# Patient Record
Sex: Male | Born: 1940 | Race: White | Hispanic: No | Marital: Married | Smoking: Former smoker
Health system: Southern US, Community
[De-identification: ages and names within clinical notes are randomized; demographics above are authoritative.]

## PROBLEM LIST (undated history)

## (undated) DIAGNOSIS — K219 Gastro-esophageal reflux disease without esophagitis: Secondary | ICD-10-CM

## (undated) DIAGNOSIS — G4733 Obstructive sleep apnea (adult) (pediatric): Secondary | ICD-10-CM

## (undated) DIAGNOSIS — I714 Abdominal aortic aneurysm, without rupture, unspecified: Secondary | ICD-10-CM

## (undated) DIAGNOSIS — C4490 Unspecified malignant neoplasm of skin, unspecified: Secondary | ICD-10-CM

## (undated) DIAGNOSIS — I499 Cardiac arrhythmia, unspecified: Secondary | ICD-10-CM

## (undated) DIAGNOSIS — M1990 Unspecified osteoarthritis, unspecified site: Secondary | ICD-10-CM

## (undated) DIAGNOSIS — Z860100 Personal history of colon polyps, unspecified: Secondary | ICD-10-CM

## (undated) DIAGNOSIS — G4730 Sleep apnea, unspecified: Secondary | ICD-10-CM

## (undated) DIAGNOSIS — I739 Peripheral vascular disease, unspecified: Secondary | ICD-10-CM

## (undated) DIAGNOSIS — I219 Acute myocardial infarction, unspecified: Secondary | ICD-10-CM

## (undated) DIAGNOSIS — I10 Essential (primary) hypertension: Secondary | ICD-10-CM

## (undated) DIAGNOSIS — Z87442 Personal history of urinary calculi: Secondary | ICD-10-CM

## (undated) DIAGNOSIS — R202 Paresthesia of skin: Secondary | ICD-10-CM

## (undated) DIAGNOSIS — E785 Hyperlipidemia, unspecified: Secondary | ICD-10-CM

## (undated) DIAGNOSIS — M2540 Effusion, unspecified joint: Secondary | ICD-10-CM

## (undated) DIAGNOSIS — N486 Induration penis plastica: Secondary | ICD-10-CM

## (undated) DIAGNOSIS — Z8619 Personal history of other infectious and parasitic diseases: Secondary | ICD-10-CM

## (undated) DIAGNOSIS — M2550 Pain in unspecified joint: Secondary | ICD-10-CM

## (undated) DIAGNOSIS — H269 Unspecified cataract: Secondary | ICD-10-CM

## (undated) DIAGNOSIS — J189 Pneumonia, unspecified organism: Secondary | ICD-10-CM

## (undated) DIAGNOSIS — I4891 Unspecified atrial fibrillation: Secondary | ICD-10-CM

## (undated) DIAGNOSIS — I509 Heart failure, unspecified: Secondary | ICD-10-CM

## (undated) DIAGNOSIS — H9319 Tinnitus, unspecified ear: Secondary | ICD-10-CM

## (undated) DIAGNOSIS — I2510 Atherosclerotic heart disease of native coronary artery without angina pectoris: Secondary | ICD-10-CM

## (undated) HISTORY — DX: Atherosclerotic heart disease of native coronary artery without angina pectoris: I25.10

## (undated) HISTORY — DX: Hyperlipidemia, unspecified: E78.5

## (undated) HISTORY — DX: Abdominal aortic aneurysm, without rupture, unspecified: I71.40

## (undated) HISTORY — DX: Induration penis plastica: N48.6

## (undated) HISTORY — DX: Acute myocardial infarction, unspecified: I21.9

## (undated) HISTORY — DX: Peripheral vascular disease, unspecified: I73.9

## (undated) HISTORY — DX: Essential (primary) hypertension: I10

## (undated) HISTORY — DX: Obstructive sleep apnea (adult) (pediatric): G47.33

## (undated) HISTORY — PX: COLONOSCOPY: SHX174

## (undated) HISTORY — DX: Heart failure, unspecified: I50.9

## (undated) HISTORY — DX: Unspecified osteoarthritis, unspecified site: M19.90

## (undated) HISTORY — DX: Sleep apnea, unspecified: G47.30

## (undated) HISTORY — DX: Unspecified atrial fibrillation: I48.91

## (undated) HISTORY — DX: Unspecified malignant neoplasm of skin, unspecified: C44.90

## (undated) HISTORY — DX: Abdominal aortic aneurysm, without rupture: I71.4

---

## 1973-10-31 HISTORY — PX: RHINOPLASTY: SUR1284

## 1997-10-31 HISTORY — PX: CORONARY ARTERY BYPASS GRAFT: SHX141

## 2002-01-09 ENCOUNTER — Ambulatory Visit (HOSPITAL_COMMUNITY): Admission: RE | Admit: 2002-01-09 | Discharge: 2002-01-09 | Payer: Self-pay | Admitting: Family Medicine

## 2002-01-09 ENCOUNTER — Encounter (INDEPENDENT_AMBULATORY_CARE_PROVIDER_SITE_OTHER): Payer: Self-pay | Admitting: Specialist

## 2004-03-01 ENCOUNTER — Encounter (INDEPENDENT_AMBULATORY_CARE_PROVIDER_SITE_OTHER): Payer: Self-pay | Admitting: *Deleted

## 2004-03-01 ENCOUNTER — Ambulatory Visit (HOSPITAL_COMMUNITY): Admission: RE | Admit: 2004-03-01 | Discharge: 2004-03-01 | Payer: Self-pay | Admitting: *Deleted

## 2005-08-31 HISTORY — PX: PENILE PROSTHESIS IMPLANT: SHX240

## 2005-09-05 ENCOUNTER — Ambulatory Visit (HOSPITAL_COMMUNITY): Admission: RE | Admit: 2005-09-05 | Discharge: 2005-09-07 | Payer: Self-pay | Admitting: Urology

## 2005-10-10 ENCOUNTER — Ambulatory Visit (HOSPITAL_BASED_OUTPATIENT_CLINIC_OR_DEPARTMENT_OTHER): Admission: RE | Admit: 2005-10-10 | Discharge: 2005-10-10 | Payer: Self-pay | Admitting: Urology

## 2005-10-10 ENCOUNTER — Ambulatory Visit (HOSPITAL_COMMUNITY): Admission: RE | Admit: 2005-10-10 | Discharge: 2005-10-10 | Payer: Self-pay | Admitting: Urology

## 2006-01-20 ENCOUNTER — Ambulatory Visit: Payer: Self-pay | Admitting: Internal Medicine

## 2006-01-27 ENCOUNTER — Ambulatory Visit: Payer: Self-pay | Admitting: Internal Medicine

## 2006-03-13 ENCOUNTER — Ambulatory Visit: Payer: Self-pay | Admitting: Internal Medicine

## 2007-01-31 ENCOUNTER — Ambulatory Visit: Payer: Self-pay | Admitting: Internal Medicine

## 2007-02-08 DIAGNOSIS — E782 Mixed hyperlipidemia: Secondary | ICD-10-CM | POA: Insufficient documentation

## 2007-02-08 DIAGNOSIS — I10 Essential (primary) hypertension: Secondary | ICD-10-CM | POA: Insufficient documentation

## 2007-02-19 ENCOUNTER — Ambulatory Visit: Payer: Self-pay | Admitting: Internal Medicine

## 2007-02-19 LAB — CONVERTED CEMR LAB
BUN: 12 mg/dL (ref 6–23)
Basophils Absolute: 0 10*3/uL (ref 0.0–0.1)
Basophils Relative: 1 % (ref 0.0–1.0)
CO2: 32 meq/L (ref 19–32)
Calcium: 9.8 mg/dL (ref 8.4–10.5)
Chloride: 108 meq/L (ref 96–112)
Creatinine, Ser: 1.1 mg/dL (ref 0.4–1.5)
Eosinophils Absolute: 0.2 10*3/uL (ref 0.0–0.6)
Eosinophils Relative: 3.3 % (ref 0.0–5.0)
GFR calc Af Amer: 86 mL/min
GFR calc non Af Amer: 71 mL/min
Glucose, Bld: 93 mg/dL (ref 70–99)
HCT: 41.5 % (ref 39.0–52.0)
Hemoglobin: 14.6 g/dL (ref 13.0–17.0)
Lymphocytes Relative: 38.6 % (ref 12.0–46.0)
MCHC: 35.2 g/dL (ref 30.0–36.0)
MCV: 91.6 fL (ref 78.0–100.0)
Monocytes Absolute: 0.5 10*3/uL (ref 0.2–0.7)
Monocytes Relative: 9.8 % (ref 3.0–11.0)
Neutro Abs: 2.3 10*3/uL (ref 1.4–7.7)
Neutrophils Relative %: 47.3 % (ref 43.0–77.0)
PSA: 1.83 ng/mL (ref 0.10–4.00)
Platelets: 199 10*3/uL (ref 150–400)
Potassium: 4.4 meq/L (ref 3.5–5.1)
RBC: 4.53 M/uL (ref 4.22–5.81)
RDW: 11.8 % (ref 11.5–14.6)
Sodium: 145 meq/L (ref 135–145)
WBC: 4.9 10*3/uL (ref 4.5–10.5)

## 2007-05-01 ENCOUNTER — Encounter: Payer: Self-pay | Admitting: Internal Medicine

## 2007-05-23 ENCOUNTER — Encounter: Payer: Self-pay | Admitting: Internal Medicine

## 2007-06-22 ENCOUNTER — Ambulatory Visit: Payer: Self-pay | Admitting: Family Medicine

## 2007-06-22 DIAGNOSIS — M79609 Pain in unspecified limb: Secondary | ICD-10-CM

## 2007-06-25 ENCOUNTER — Telehealth (INDEPENDENT_AMBULATORY_CARE_PROVIDER_SITE_OTHER): Payer: Self-pay | Admitting: *Deleted

## 2007-06-27 LAB — CONVERTED CEMR LAB
Basophils Absolute: 0 10*3/uL (ref 0.0–0.1)
Basophils Relative: 0.8 % (ref 0.0–1.0)
Eosinophils Absolute: 0.2 10*3/uL (ref 0.0–0.6)
Eosinophils Relative: 3.7 % (ref 0.0–5.0)
HCT: 40 % (ref 39.0–52.0)
Hemoglobin: 14 g/dL (ref 13.0–17.0)
Lymphocytes Relative: 38.9 % (ref 12.0–46.0)
MCHC: 34.9 g/dL (ref 30.0–36.0)
MCV: 91.4 fL (ref 78.0–100.0)
Monocytes Absolute: 0.6 10*3/uL (ref 0.2–0.7)
Monocytes Relative: 10.7 % (ref 3.0–11.0)
Neutro Abs: 2.6 10*3/uL (ref 1.4–7.7)
Neutrophils Relative %: 45.9 % (ref 43.0–77.0)
Platelets: 228 10*3/uL (ref 150–400)
RBC: 4.38 M/uL (ref 4.22–5.81)
RDW: 11.9 % (ref 11.5–14.6)
Uric Acid, Serum: 5.4 mg/dL (ref 2.4–7.0)
WBC: 5.5 10*3/uL (ref 4.5–10.5)

## 2007-12-14 ENCOUNTER — Encounter: Payer: Self-pay | Admitting: Internal Medicine

## 2008-07-04 ENCOUNTER — Ambulatory Visit: Payer: Self-pay | Admitting: Internal Medicine

## 2008-07-04 DIAGNOSIS — I2510 Atherosclerotic heart disease of native coronary artery without angina pectoris: Secondary | ICD-10-CM | POA: Insufficient documentation

## 2008-07-08 ENCOUNTER — Encounter (INDEPENDENT_AMBULATORY_CARE_PROVIDER_SITE_OTHER): Payer: Self-pay | Admitting: *Deleted

## 2008-07-11 ENCOUNTER — Encounter: Payer: Self-pay | Admitting: Internal Medicine

## 2008-07-24 ENCOUNTER — Ambulatory Visit: Payer: Self-pay | Admitting: Pulmonary Disease

## 2008-07-24 ENCOUNTER — Telehealth: Payer: Self-pay | Admitting: Internal Medicine

## 2008-07-24 DIAGNOSIS — G4710 Hypersomnia, unspecified: Secondary | ICD-10-CM | POA: Insufficient documentation

## 2008-07-24 DIAGNOSIS — I219 Acute myocardial infarction, unspecified: Secondary | ICD-10-CM | POA: Insufficient documentation

## 2008-07-24 DIAGNOSIS — Z85828 Personal history of other malignant neoplasm of skin: Secondary | ICD-10-CM | POA: Insufficient documentation

## 2008-07-30 ENCOUNTER — Encounter: Payer: Self-pay | Admitting: Pulmonary Disease

## 2008-08-26 ENCOUNTER — Ambulatory Visit (HOSPITAL_BASED_OUTPATIENT_CLINIC_OR_DEPARTMENT_OTHER): Admission: RE | Admit: 2008-08-26 | Discharge: 2008-08-26 | Payer: Self-pay | Admitting: Pulmonary Disease

## 2008-08-26 ENCOUNTER — Encounter: Payer: Self-pay | Admitting: Pulmonary Disease

## 2008-09-09 ENCOUNTER — Ambulatory Visit: Payer: Self-pay | Admitting: Pulmonary Disease

## 2008-09-11 ENCOUNTER — Telehealth (INDEPENDENT_AMBULATORY_CARE_PROVIDER_SITE_OTHER): Payer: Self-pay | Admitting: *Deleted

## 2008-09-12 ENCOUNTER — Ambulatory Visit: Payer: Self-pay | Admitting: Pulmonary Disease

## 2008-09-12 DIAGNOSIS — G4733 Obstructive sleep apnea (adult) (pediatric): Secondary | ICD-10-CM

## 2008-09-17 ENCOUNTER — Telehealth: Payer: Self-pay | Admitting: Pulmonary Disease

## 2008-09-22 ENCOUNTER — Telehealth (INDEPENDENT_AMBULATORY_CARE_PROVIDER_SITE_OTHER): Payer: Self-pay | Admitting: *Deleted

## 2008-10-13 ENCOUNTER — Telehealth (INDEPENDENT_AMBULATORY_CARE_PROVIDER_SITE_OTHER): Payer: Self-pay | Admitting: *Deleted

## 2008-11-10 ENCOUNTER — Ambulatory Visit: Payer: Self-pay | Admitting: Internal Medicine

## 2008-11-10 LAB — CONVERTED CEMR LAB
Bilirubin Urine: NEGATIVE
Blood in Urine, dipstick: NEGATIVE
Glucose, Urine, Semiquant: NEGATIVE
Ketones, urine, test strip: NEGATIVE
Nitrite: NEGATIVE
Protein, U semiquant: NEGATIVE
Specific Gravity, Urine: <1.005
Urobilinogen, UA: NEGATIVE
WBC Urine, dipstick: NEGATIVE
pH: 5

## 2008-11-14 LAB — CONVERTED CEMR LAB
ALT: 24 units/L (ref 0–53)
AST: 21 units/L (ref 0–37)
BUN: 15 mg/dL (ref 6–23)
Basophils Absolute: 0 10*3/uL (ref 0.0–0.1)
Basophils Relative: 0.6 % (ref 0.0–3.0)
CO2: 34 meq/L — ABNORMAL HIGH (ref 19–32)
Calcium: 9.3 mg/dL (ref 8.4–10.5)
Chloride: 104 meq/L (ref 96–112)
Creatinine, Ser: 1 mg/dL (ref 0.4–1.5)
Eosinophils Absolute: 0.2 10*3/uL (ref 0.0–0.7)
Eosinophils Relative: 5.4 % — ABNORMAL HIGH (ref 0.0–5.0)
GFR calc Af Amer: 96 mL/min
GFR calc non Af Amer: 79 mL/min
Glucose, Bld: 87 mg/dL (ref 70–99)
HCT: 42.5 % (ref 39.0–52.0)
Hemoglobin: 14.4 g/dL (ref 13.0–17.0)
Lymphocytes Relative: 43.3 % (ref 12.0–46.0)
MCHC: 33.9 g/dL (ref 30.0–36.0)
MCV: 94.8 fL (ref 78.0–100.0)
Monocytes Absolute: 0.5 10*3/uL (ref 0.1–1.0)
Monocytes Relative: 11.2 % (ref 3.0–12.0)
Neutro Abs: 1.6 10*3/uL (ref 1.4–7.7)
Neutrophils Relative %: 39.5 % — ABNORMAL LOW (ref 43.0–77.0)
PSA: 1.57 ng/mL (ref 0.10–4.00)
Platelets: 150 10*3/uL (ref 150–400)
Potassium: 4.6 meq/L (ref 3.5–5.1)
RBC: 4.48 M/uL (ref 4.22–5.81)
RDW: 12.3 % (ref 11.5–14.6)
Sodium: 141 meq/L (ref 135–145)
TSH: 3.08 microintl units/mL (ref 0.35–5.50)
WBC: 4.1 10*3/uL — ABNORMAL LOW (ref 4.5–10.5)

## 2008-12-17 ENCOUNTER — Encounter: Payer: Self-pay | Admitting: Pulmonary Disease

## 2009-02-09 ENCOUNTER — Ambulatory Visit: Payer: Self-pay | Admitting: Pulmonary Disease

## 2009-04-20 ENCOUNTER — Encounter: Payer: Self-pay | Admitting: Internal Medicine

## 2009-04-20 LAB — HM COLONOSCOPY

## 2009-04-23 ENCOUNTER — Encounter: Payer: Self-pay | Admitting: Pulmonary Disease

## 2009-05-01 ENCOUNTER — Encounter: Payer: Self-pay | Admitting: Internal Medicine

## 2009-09-14 ENCOUNTER — Encounter: Payer: Self-pay | Admitting: Internal Medicine

## 2010-01-01 ENCOUNTER — Ambulatory Visit: Payer: Self-pay | Admitting: Internal Medicine

## 2010-01-06 LAB — CONVERTED CEMR LAB
Basophils Absolute: 0 10*3/uL (ref 0.0–0.1)
Basophils Relative: 1 % (ref 0–1)
Eosinophils Absolute: 0.1 10*3/uL (ref 0.0–0.7)
Eosinophils Relative: 2 % (ref 0–5)
HCT: 42.7 % (ref 39.0–52.0)
Lymphs Abs: 2.5 10*3/uL (ref 0.7–4.0)
MCHC: 33 g/dL (ref 30.0–36.0)
MCV: 93.8 fL (ref 78.0–100.0)
Monocytes Absolute: 0.6 10*3/uL (ref 0.1–1.0)
Monocytes Relative: 10 % (ref 3–12)
PSA: 1.37 ng/mL (ref 0.10–4.00)
Platelets: 176 10*3/uL (ref 150–400)
RBC: 4.55 M/uL (ref 4.22–5.81)
RDW: 13.5 % (ref 11.5–15.5)
WBC: 5.5 10*3/uL (ref 4.0–10.5)

## 2010-01-11 ENCOUNTER — Ambulatory Visit: Payer: Self-pay | Admitting: Internal Medicine

## 2010-01-18 LAB — CONVERTED CEMR LAB
BUN: 17 mg/dL (ref 6–23)
Calcium: 9.3 mg/dL (ref 8.4–10.5)
Chloride: 107 meq/L (ref 96–112)
GFR calc non Af Amer: 70.41 mL/min (ref >60)
Glucose, Bld: 77 mg/dL (ref 70–99)
Potassium: 4.5 meq/L (ref 3.5–5.1)
Sodium: 142 meq/L (ref 135–145)

## 2010-04-07 ENCOUNTER — Encounter (INDEPENDENT_AMBULATORY_CARE_PROVIDER_SITE_OTHER): Payer: Self-pay | Admitting: *Deleted

## 2010-04-07 ENCOUNTER — Encounter: Payer: Self-pay | Admitting: Internal Medicine

## 2010-04-07 LAB — CONVERTED CEMR LAB
ALT: 31 units/L
Cholesterol: 150 mg/dL
HDL: 43 mg/dL
LDL Cholesterol: 72 mg/dL
Platelets: 156 10*3/uL
Triglycerides: 175 mg/dL
WBC, blood: 5.2 10*3/uL

## 2010-04-22 ENCOUNTER — Encounter (INDEPENDENT_AMBULATORY_CARE_PROVIDER_SITE_OTHER): Payer: Self-pay | Admitting: *Deleted

## 2010-08-06 ENCOUNTER — Ambulatory Visit: Payer: Self-pay | Admitting: Internal Medicine

## 2010-11-28 LAB — CONVERTED CEMR LAB
ALT: 26 units/L
AST: 30 units/L
Cholesterol: 162 mg/dL
HDL: 35 mg/dL
LDL Cholesterol: 75 mg/dL
Triglyceride fasting, serum: 256 mg/dL

## 2010-11-30 NOTE — Miscellaneous (Signed)
  Clinical Lists Changes  Observations: Added new observation of CPK: 221 units/L (04/22/2010 16:39) Added new observation of TRIGLYC TOT: 175 mg/dL (16/08/9603 54:09) Added new observation of LDL: 72 mg/dL (81/19/1478 29:56) Added new observation of CHOLESTEROL: 150 mg/dL (21/30/8657 84:69) Added new observation of SGPT (ALT): 31 units/L (04/07/2010 16:39) Added new observation of SGOT (AST): 23 units/L (04/07/2010 16:39) Added new observation of PLATELETS: 156 10*3/mm3 (04/07/2010 16:39) Added new observation of HGB: 15.4 g/dL (62/95/2841 32:44) Added new observation of WBC: 5.2 10*3/mm3 (04/07/2010 16:39) Added new observation of HDL: 43 mg/dL (11/02/7251 66:44)  Appended Document:

## 2010-11-30 NOTE — Miscellaneous (Signed)
Summary: labs from dr Harriette Bouillon (cardiology)  Clinical Lists Changes  Observations: Added new observation of SGPT (ALT): 26 units/L (09/14/2009 8:50) Added new observation of SGOT (AST): 30 units/L (09/14/2009 8:50) Added new observation of TRIGLYCERIDE: 256 mg/dL (16/08/9603 5:40) Added new observation of HDL: 35 mg/dL (98/08/9146 8:29) Added new observation of LDL: 75 mg/dL (56/21/3086 5:78) Added new observation of CHOLESTEROL: 162 mg/dL (46/96/2952 8:41)      Lipid Panel Test Date: 09/14/2009                        Value        Units        H/L   Reference  Cholesterol:          162          mg/dL              (324-401) LDL Cholesterol:      75           mg/dL              (02-725) HDL Cholesterol:      35           mg/dL              (36-64) Triglyceride:         256          mg/dL              (40-347) SGOT (AST)            30                              SGPT (ALT)            26                               Test ordered by:      Dr. Harriette Bouillon (cardiology)

## 2010-11-30 NOTE — Assessment & Plan Note (Signed)
Summary: CPX/NS/KDC   Vital Signs:  Patient profile:   70 year old male Height:      70 inches Weight:      229.4 pounds BMI:     33.03 Pulse rate:   58 / minute BP sitting:   120 / 70  Vitals Entered By: Shary Decamp (January 01, 2010 2:36 PM) CC: cpx - not fasting, sees cardiology every 6 months, last Td<10 years    History of Present Illness: Hyperlipidemia-- good medication compliance , no unusual  myalgias   Hypertension-- good medication compliance , ambulatory BPs in the 120s/70-80  Coronary artery disease-- sees Dr Gomez Cleverly q 6 months   SKIN CANCER, HX OF still sees derm routinely, q 6 months    yearly exam, chart reviewed   Preventive Screening-Counseling & Management  Caffeine-Diet-Exercise     Caffeine use/day: 1     Does Patient Exercise: yes     Type of exercise: walk     Times/week: 5      Drug Use:  no.    Current Medications (verified): 1)  Lisinopril 20 Mg Tabs (Lisinopril) .Marland Kitchen.. 1 By Mouth Two Times A Day 2)  Lipitor 20 Mg Tabs (Atorvastatin Calcium) .Marland Kitchen.. 1 By Mouth Once Daily 3)  Toprol Xl 200 Mg Xr24h-Tab (Metoprolol Succinate) .Marland Kitchen.. 1 By Mouth Once Daily 4)  Aspirin Adult Low Strength 81 Mg Tbec (Aspirin) .Marland Kitchen.. 1 By Mouth Once Daily  Allergies (verified): No Known Drug Allergies  Past History:  Past Medical History: Reviewed history from 11/10/2008 and no changes required. Hyperlipidemia Hypertension Coronary artery disease, s/p CABG h/o Myocardial Infarction SKIN CANCER, HX OF--  several, one of them was melanoma (sees derm routinely) OSA, started CPAP 12-09  Past Surgical History: Reviewed history from 11/10/2008 and no changes required. CABG 1999 penile implant 08-2005  Family History: Reviewed history from 11/10/2008 and no changes required. OSA--brother Colon ca-- aunt  prostate ca-- F heart disease: mother, father cancer: mother, father, brother, sister (all had skin cancer)   Social History: Reviewed history from  07/24/2008 and no changes required. pt is married. children-- 1 son, 2 step children  Patient states former smoker ETOH-- socially  exercise -- more active since the weather improved diet-- started back a good diet recently Does Patient Exercise:  yes Drug Use:  no Caffeine use/day:  1  Review of Systems CV:  Denies chest pain or discomfort, palpitations, and swelling of feet. Resp:  Denies cough and shortness of breath. GI:  Denies bloody stools, diarrhea, nausea, and vomiting. GU:  Denies hematuria, urinary frequency, and urinary hesitancy.  Physical Exam  General:  alert, well-developed, and well-nourished.   Neck:  no thyromegaly and normal carotid upstroke.   Lungs:  normal respiratory effort, no intercostal retractions, no accessory muscle use, and normal breath sounds.   Heart:  normal rate, regular rhythm, no murmur, and no gallop.   Abdomen:  soft, non-tender, no distention, and no masses.   Rectal:  No external abnormalities noted. Normal sphincter tone. No rectal masses or tenderness. Prostate:  Prostate gland firm and smooth, no enlargement, nodularity, tenderness, mass, asymmetry or induration. Extremities:  no edema, calves  symmetric Psych:  Cognition and judgment appear intact. Alert and cooperative with normal attention span and concentration.  not anxious appearing and not depressed appearing.     Impression & Recommendations:  Problem # 1:  CORONARY ARTERY DISEASE (ICD-414.00) asymptomatic, states that she saw his cardiologist a few weeks ago, apparently a BMP, LFTs  and cholesterol panel were drawn His updated medication list for this problem includes:    Lisinopril 20 Mg Tabs (Lisinopril) .Marland Kitchen... 1 by mouth two times a day    Toprol Xl 200 Mg Xr24h-tab (Metoprolol succinate) .Marland Kitchen... 1 by mouth once daily    Aspirin Adult Low Strength 81 Mg Tbec (Aspirin) .Marland Kitchen... 1 by mouth once daily  Problem # 2:  HEALTH SCREENING (ICD-V70.0) Td  < 10 years pneumonia shot 2006  and today  pt reports first Cscope in 2007 aprox. polyps x 6  2nd Cscope in 2008 was 'ok' last colonoscopy 03/2009, next  in 5 years per GI  recommend to continue with a healthy lifestyle check a PSA    Problem # 3:  OBSTRUCTIVE SLEEP APNEA (ICD-327.23) last note from  pulmonary  reviewed   Problem # 4:  HYPERTENSION (ICD-401.9)  at goal  will get labs from cardiology. Check a CBC and a TSH His updated medication list for this problem includes:    Lisinopril 20 Mg Tabs (Lisinopril) .Marland Kitchen... 1 by mouth two times a day    Toprol Xl 200 Mg Xr24h-tab (Metoprolol succinate) .Marland Kitchen... 1 by mouth once daily  BP today: 120/70 Prior BP: 138/70 (02/09/2009)  Labs Reviewed: K+: 4.6 (11/10/2008) Creat: : 1.0 (11/10/2008)     Orders: Venipuncture (16109)  Problem # 5:  HYPERLIPIDEMIA (ICD-272.4) follow up with cardiology His updated medication list for this problem includes:    Lipitor 20 Mg Tabs (Atorvastatin calcium) .Marland Kitchen... 1 by mouth once daily  Labs Reviewed: SGOT: 21 (11/10/2008)   SGPT: 24 (11/10/2008)  Problem # 6:  he is follow up closely by cardiology next appointment: One year  Complete Medication List: 1)  Lisinopril 20 Mg Tabs (Lisinopril) .Marland Kitchen.. 1 by mouth two times a day 2)  Lipitor 20 Mg Tabs (Atorvastatin calcium) .Marland Kitchen.. 1 by mouth once daily 3)  Toprol Xl 200 Mg Xr24h-tab (Metoprolol succinate) .Marland Kitchen.. 1 by mouth once daily 4)  Aspirin Adult Low Strength 81 Mg Tbec (Aspirin) .Marland Kitchen.. 1 by mouth once daily  Other Orders: Pneumococcal Vaccine (60454) Admin 1st Vaccine (09811)  CHF Assessment/Plan:      The patient's current weight is 229.4 pounds.  His previous weight was 214.25 pounds.    Patient Instructions: 1)  Please schedule a follow-up appointment in 1 year.    Preventive Care Screening  Prior Values:    PSA:  1.57 (11/10/2008)    Colonoscopy:  Hyperplastic Polyp (04/20/2009)    Last Pneumovax:  per pt (10/31/2004)     Risk Factors:  Drug use:   no Caffeine use:  1 drinks per day Exercise:  yes    Times per week:  5    Type:  walk    Immunizations Administered:  Pneumonia Vaccine:    Vaccine Type: Pneumovax (Medicare)    Site: right deltoid    Mfr: Merck    Dose: 0.5 ml    Route: IM    Given by: Shary Decamp    Exp. Date: 02/18/2011    Lot #: [id number]

## 2010-11-30 NOTE — Assessment & Plan Note (Signed)
Summary: FLU SHOT//KN  Nurse Visit   Allergies: No Known Drug Allergies  Orders Added: 1)  Flu Vaccine 91yrs + MEDICARE PATIENTS [Q2039] 2)  Administration Flu vaccine - MCR [G0008] Flu Vaccine Consent Questions     Do you have a history of severe allergic reactions to this vaccine? no    Any prior history of allergic reactions to egg and/or gelatin? no    Do you have a sensitivity to the preservative Thimersol? no    Do you have a past history of Guillan-Barre Syndrome? no    Do you currently have an acute febrile illness? no    Have you ever had a severe reaction to latex? no    Vaccine information given and explained to patient? yes    Are you currently pregnant? no    Lot Number:[id number]   Exp Date:04/30/2011   Site Given  Right Deltoid IMistration Flu vaccine - MCR [G0008] .lbmedflu

## 2011-02-11 ENCOUNTER — Encounter: Payer: Self-pay | Admitting: Internal Medicine

## 2011-02-16 ENCOUNTER — Encounter: Payer: Self-pay | Admitting: Internal Medicine

## 2011-02-16 ENCOUNTER — Ambulatory Visit (INDEPENDENT_AMBULATORY_CARE_PROVIDER_SITE_OTHER): Payer: Medicare PPO | Admitting: Internal Medicine

## 2011-02-16 DIAGNOSIS — I2510 Atherosclerotic heart disease of native coronary artery without angina pectoris: Secondary | ICD-10-CM

## 2011-02-16 DIAGNOSIS — I10 Essential (primary) hypertension: Secondary | ICD-10-CM

## 2011-02-16 DIAGNOSIS — G4700 Insomnia, unspecified: Secondary | ICD-10-CM

## 2011-02-16 DIAGNOSIS — Z125 Encounter for screening for malignant neoplasm of prostate: Secondary | ICD-10-CM

## 2011-02-16 DIAGNOSIS — Z0000 Encounter for general adult medical examination without abnormal findings: Secondary | ICD-10-CM | POA: Insufficient documentation

## 2011-02-16 DIAGNOSIS — G4733 Obstructive sleep apnea (adult) (pediatric): Secondary | ICD-10-CM | POA: Insufficient documentation

## 2011-02-16 LAB — CBC WITH DIFFERENTIAL/PLATELET
Basophils Absolute: 0 10*3/uL (ref 0.0–0.1)
Eosinophils Absolute: 0.1 10*3/uL (ref 0.0–0.7)
Eosinophils Relative: 1.5 % (ref 0.0–5.0)
HCT: 44.1 % (ref 39.0–52.0)
Hemoglobin: 15.4 g/dL (ref 13.0–17.0)
Lymphocytes Relative: 34.2 % (ref 12.0–46.0)
Lymphs Abs: 2 10*3/uL (ref 0.7–4.0)
MCHC: 35 g/dL (ref 30.0–36.0)
MCV: 94.3 fl (ref 78.0–100.0)
Monocytes Absolute: 0.5 10*3/uL (ref 0.1–1.0)
Neutrophils Relative %: 55.7 % (ref 43.0–77.0)
Platelets: 165 10*3/uL (ref 150.0–400.0)
RBC: 4.68 Mil/uL (ref 4.22–5.81)
RDW: 13.2 % (ref 11.5–14.6)
WBC: 5.7 10*3/uL (ref 4.5–10.5)

## 2011-02-16 LAB — BASIC METABOLIC PANEL
CO2: 31 mEq/L (ref 19–32)
Calcium: 9.4 mg/dL (ref 8.4–10.5)
GFR: 60.56 mL/min (ref >60.00)
Glucose, Bld: 88 mg/dL (ref 70–99)
Potassium: 4.7 mEq/L (ref 3.5–5.1)
Sodium: 143 mEq/L (ref 135–145)

## 2011-02-16 LAB — ALT: ALT: 27 U/L (ref 0–53)

## 2011-02-16 LAB — TSH: TSH: 2.06 u[IU]/mL (ref 0.35–5.50)

## 2011-02-16 LAB — PSA: PSA: 1.94 ng/mL (ref 0.10–4.00)

## 2011-02-16 LAB — AST: AST: 31 U/L (ref 0–37)

## 2011-02-16 NOTE — Assessment & Plan Note (Addendum)
Td  < 10 years pneumonia shot 2006 and 2011  pt reports first Cscope in 2007 aprox. polyps x 6  2nd Cscope in 2008 was 'ok' last colonoscopy 03/2009, next  in 5 years per GI  recommend to continue with a healthy lifestyle We'll check all his labs including a PSA. He's not fasting, I will recommend him to check his cholesterol with cardiology, he sees them twice a year

## 2011-02-16 NOTE — Patient Instructions (Signed)
I recommend you to check your cholesterol with cardiology.

## 2011-02-16 NOTE — Assessment & Plan Note (Signed)
Sees cardiology, Dr. Harriette Bouillon in [facility] every 6 months. Apparently get his cholesterol checked there

## 2011-02-16 NOTE — Assessment & Plan Note (Signed)
Well-controlled, no change 

## 2011-02-16 NOTE — Assessment & Plan Note (Signed)
Counseled about good habits. Has used [facility] "PMs" meds . We discussed meds, will call if interested

## 2011-02-16 NOTE — Progress Notes (Signed)
  Subjective:    Patient ID: Philip [name], male    DOB: 1941-01-26, 70 y.o.   MRN: [id number]  HPI  CPX Doing well except for insomnia, frequently he wakes up at night and is unable to go back to sleep. Think is related to worries that he has. "Unable to shut down my brain"  Past Medical History  Diagnosis Date  . Hyperlipidemia   . Hypertension   . CAD (coronary artery disease)     s/p CABG  . Myocardial infarct   . Skin cancer     several , one of them was melanoma (sees derm routinely)  . OSA (obstructive sleep apnea)     started CPAP 12/09   Past Surgical History  Procedure Date  . Coronary artery bypass graft 1999  . Penile prosthesis implant 08/2005   Family History  Problem Relation Age of Onset  . Sleep apnea Brother   . Colon cancer      aunt  . Prostate cancer Father   . Heart disease Mother     M and F  . Skin cancer Mother     M, F, B, S    History   Social History  . Marital Status: Married    Spouse Name: N/A    Number of Children: 3  . Years of Education: N/A   Occupational History  . Not on file.   Social History Main Topics  . Smoking status: Former Games developer  . Smokeless tobacco: Not on file  . Alcohol Use: Yes     socially  . Drug Use: No  . Sexually Active: Not on file   Other Topics Concern  . Not on file   Social History Narrative   1 son, 2 step chaldern.Marland KitchenMarland KitchenMarland KitchenExercise- golf x2/w. Walks ....Marland KitchenMarland KitchenDiet-- does watch     Review of Systems  Constitutional:       Some allergies, on allegra, works well   Respiratory: Negative for cough and wheezing.   Cardiovascular: Negative for chest pain, palpitations and leg swelling.  Gastrointestinal: Negative for nausea, abdominal pain, diarrhea and blood in stool.  Genitourinary: Negative for dysuria and urgency.  Psychiatric/Behavioral:       Some anxiety       Objective:   Physical Exam  Constitutional: He is oriented to person, place, and time. He appears well-developed and  well-nourished.  Neck:       Normal carotid pulses, no thyromegaly  Cardiovascular: Normal rate, regular rhythm and normal heart sounds.   No murmur heard. Pulmonary/Chest: Effort normal and breath sounds normal. No respiratory distress. He has no wheezes. He has no rales.  Abdominal: Soft. Bowel sounds are normal. He exhibits no distension. There is no tenderness. There is no rebound and no guarding.  Genitourinary: Rectum normal and prostate normal.  Musculoskeletal: He exhibits no edema.  Neurological: He is alert and oriented to person, place, and time.  Psychiatric: He has a normal mood and affect. His behavior is normal. Judgment and thought content normal.          Assessment & Plan:  Today , I spent more than 25  min with the patient reviewing his chart and  discussing his chronic medical problems and a new problem (insomnia); see a/p

## 2011-02-18 ENCOUNTER — Telehealth: Payer: Self-pay | Admitting: *Deleted

## 2011-02-18 NOTE — Telephone Encounter (Signed)
Message copied by Army Fossa on Fri Feb 18, 2011  3:17 PM ------      Message from: Willow Ora      Created: Fri Feb 18, 2011 12:43 PM       Advise patient, all his results are normal. Send him and  Dr. Harriette Bouillon ( cardiology in [facility]) a copy of results

## 2011-02-18 NOTE — Telephone Encounter (Signed)
Unable to reach pt, will try later. Will labs to cardiologist.

## 2011-02-21 NOTE — Telephone Encounter (Signed)
I spoke w/ pt he is aware.  

## 2011-03-15 NOTE — Procedures (Signed)
Philip [name], Philip [name]                ACCOUNT NO.:  [id number]   MEDICAL RECORD NO.:  [id number]          PATIENT TYPE:  OUT   LOCATION:  SLEEP CENTER                 FACILITY:  [facility]   PHYSICIAN:  Barbaraann Share, MD,FCCPDATE OF BIRTH:  1941/04/06   DATE OF STUDY:  08/26/2008                            NOCTURNAL POLYSOMNOGRAM   REFERRING PHYSICIAN:   LOCATION:  Sleep Lab.   REFERRING PHYSICIAN:  Barbaraann Share, MD, [facility]   DATE OF STUDY:  August 26, 2008.   INDICATION FOR STUDY:  Hypersomnia with sleep apnea.   EPWORTH SLEEPINESS SCORE:  4.   MEDICATIONS:   SLEEP ARCHITECTURE:  The patient had a total sleep time of 313 minutes  with no slow-wave sleep and only 27 minutes of REM.  Sleep-onset latency  was normal and REM onset was mildly prolonged at a 133 minutes.  Sleep  efficiency was decreased at 79%.   RESPIRATORY DATA:  The patient was found to have 141 apneas and 29  hypopneas for an apnea-hypopnea index of 33 events per hour.  The events  were increased in the supine position, and there was loud snoring noted  throughout.   OXYGEN DATA:  There was O2 desaturation as low as 84% with the patient's  obstructive events.   CARDIAC DATA:  Frequent PACs and PVCs were noted throughout.   MOVEMENT/PARASOMNIA:  The patient was found to have 92 periodic leg  movements with no significant sleep disruption.   IMPRESSION/RECOMMENDATION:  1. Moderate obstructive sleep apnea/hypopnea syndrome with an apnea-      hypopnea index of 33 events per hour and oxygen desaturation as      well as 84%.  Treatment for this degree of sleep apnea can include      weight loss alone if applicable, upper airway surgery, oral      appliance, and also continuous positive airway pressure.      Clinical correlation is suggested.  2. Frequent premature atrial complexes and premature ventricular      complexes noted throughout.      Barbaraann Share, MD,FCCP  Diplomate, American Board of Sleep  Medicine  Electronically Signed     [facility]/MEDQ  D:  09/09/2008 15:16:00  T:  09/10/2008 02:13:06  Job:  [id number]

## 2011-03-18 NOTE — Op Note (Signed)
NAMECAMRYN, Philip [name]                ACCOUNT NO.:  [id number]   MEDICAL RECORD NO.:  [id number]          PATIENT TYPE:  OIB   LOCATION:  1418                         FACILITY:  [facility]   PHYSICIAN:  Sigmund I. Patsi Sears, M.D.DATE OF BIRTH:  1940-12-11   DATE OF PROCEDURE:  09/05/2005  DATE OF DISCHARGE:                                 OPERATIVE REPORT   PREOPERATIVE DIAGNOSES:  1.  Erectile dysfunction.  2.  Peyronie's disease.   POSTOPERATIVE DIAGNOSES:  1.  Erectile dysfunction.  2.  Peyronie's disease.   PROCEDURE:  1.  Placement of AMS inflatable penile prosthesis.  2.  Correction of Peyronie's curvature utilizing neurovascular bundle      mobilization and dorsal incision.   SURGEON:  Jethro Bolus, MD.   ASSISTANT:  Glade Nurse, MD.   ANESTHESIA:  General endotracheal.   IMPLANTATION.:  AMS 700 CX 17 cm with bilateral 1 cm rear-tip extenders with  60 mL reservoir.   PROCEDURE:  Using a Penrose drain as a tourniquet device, we obtained an  artificial erection using a butterfly needle approximately 30 mL of sterile  normal saline. This produced an erection with an approximately 70 degree  dorsal curvature.   The patient was identified by his wrist bracelet and brought to room 12  where he received preoperative antibiotics and was administered general  anesthesia. He was prepped and draped in the usual sterile fashion taking  care to minimize risks of peripheral neuropathy or compartment syndrome.  Next, we made a 4 cm infrapubic __________ incision and we dissected down to  the areolar tissue superior to the base of the patient's penis. We bluntly  dissected to identify the bilateral corporal cavernosa. We took care to  minimize dissection in the midline protecting the neurovascular bundle. We  placed an 18-French Foley catheter, obtained clear urine output and the  bladder was drained. Next, we placed stay sutures in the lateral aspect of  the corpora  cavernosum with 2-0 Vicryl's, two of these were placed on each  side. We next made our corporotomy on each side in between the  aforementioned stay sutures. We began our corporal dissection starting our  planes with Metzenbaum scissors. We then proceeded to dilate each corpora  both proximally and distally with Mentor dilators starting at 8-French and  sequentially dilating to 14-French. This was done without any difficulty  bilaterally. We next measured the length of each corpora by placing the  measuring device proximally, putting the phallus on a mild degree of  stretch, measuring to the mid glans level. This gave [place] a measurement of 18  cm bilaterally. Next, we turned our attention to the infrapubic incision. We  reset our retractors to expose suprapubically. We dissected down to the  anterior sheath which was opened with Bovie cautery. We found the midline in  between the bodies of the rectus abdominis and dissected a space in the  space of Retzius with the surgeon's first finger that would accommodate a  reservoir device. Next, the reservoir device was placed. We closed the  fascia with running 2-0 Vicryl being careful  not to involve the reservoir  device or tubing. Next we and inflated the reservoir with 60 mL of normal  saline. There was no back pressure noted. Next the reservoir tubing was  shot. Next using the furlough device, we placed our corporal cylinders  bilaterally and placed a 1 cm rear-tip extension on each proximal cylinder.  The cylinders sat nicely in the corpora without any buckling. Next we  inflated our cylinders, excellent fit was noted. However, the patient's  contour was still noted to have an approximately 70 degree dorsal Peyronie's  was noted preoperatively. At this point, the curvature was too much to be  corrected with modeling. We next retracted the patient's foreskin and made a  circumferential incision proximal to the corona. We degloved the penis   sharply. Next, with meticulous sharp dissection, we mobilized the  neurovascular bundle for approximately 2 cm both proximal and distal to the  Peyronie's plaque which was at the midline dorsum approximately mid shaft.  We were able to mobilize the neurovascular bundle without damage to its  blood supply or contents. After this had been done, we reinspected the penis  and it was found to have now only approximately 30 degrees of dorsal  curvature. At this point, it was felt that this was not enough curvature to  warrant graft placement. We next, after the bundle had been mobilized, made  3 transverse incisions over the plaque area. These extended laterally from  the midline to approximately 0.5 cm to each side laterally. We dissected  down using the cutting current on the Bovie cautery through the septum to  each bilateral cylinder. This was done again in 3 spots over the plaque.  Each incision was approximately 0.5 cm from the next incision. When this had  been done, the phallus was noted to be straight without a any evidence of  chordee. Next, we made sure we obtained hemostasis from the degloved penis  and the circumferential incision was reapproximated using interrupted 3-0  chromic sutures. Next, we turned our attention back to the inferior pubic  incision. Our corporotomies was were closed with running 2-0 Vicryl taking  great care to avoid damage to the cylinders or the tubing. Next using the  surgeon's first finger and blunt dissection, we created a subdartos pouch in  the patient's right hemiscrotum in the patient's most dependent portion of  the hemiscrotum. We then placed the pump in the correct orientation in the  right hemiscrotum. After we irrigated the tubing with sterile normal saline,  we used a connector kit to connect the reservoir to the pump tubing using a  straight connector. We then copiously irrigated the wound. Following this, the wound was closed in two layers  closing the Scarpa's with a running 3-0  Vicryl and closing the skin with a running 4-0 subcuticular Monocryl.  Dressings were applied. We then were inflated the IPP. The patient again was  noted to have excellent contour as described above. We then deflated the  device leaving a small amount of fluid in the cylinders to provide  hemostasis. Next, the dressings were applied. The Foley catheter was  connected to drainage bag. The patient was reversed from his anesthesia  which he tolerated without complication. Please note Dr. Jethro Bolus  was present and participated in all aspects of this case.     ______________________________  Glade Nurse, MD      Sigmund I. Patsi Sears, M.D.  Electronically Signed    MT/MEDQ  D:  09/05/2005  T:  09/06/2005  Job:  [id number]

## 2011-03-18 NOTE — Op Note (Signed)
NAME:  Philip [name], Philip [name]                ACCOUNT NO.:  [id number]   MEDICAL RECORD NO.:  [id number]          PATIENT TYPE:  AMB   LOCATION:  [facility]                         FACILITY:  [facility]   PHYSICIAN:  Sigmund I. Patsi Sears, M.D.DATE OF BIRTH:  01/19/1941   DATE OF PROCEDURE:  10/10/2005  DATE OF DISCHARGE:                                 OPERATIVE REPORT   PREOPERATIVE DIAGNOSIS:  Paraphimosis, status post inflatable penile  prosthesis, with neurovascular bundle dissection.   POSTOPERATIVE DIAGNOSIS:  Paraphimosis, status post inflatable penile  prosthesis, with neurovascular bundle dissection.   OPERATION:  Circumcision.   SURGEON:  Sigmund I. Patsi Sears, M.D.   ANESTHESIA:  General LMA.   PREPARATION:  After appropriate preanesthesia, the patient is brought to the  operating room, placed upon the operating table in the dorsal supine  position where general LMA anesthesia was introduced.   REVIEW OF HISTORY:  This 70 year old male was status post insertion of an  inflatable penile prosthesis, with neurovascular bundle dissection, on  November 2006. Postoperatively, the patient has done well, except for  paraphimosis. He has had good sensation in his penile glans, but he  continues to have penile edema, particularly in the distal foreskin.   Because the foreskin has not released, and my concern is that the patient  may develop decrease in blood supply to the glans, I think he needs to have  release of the binding tissue. He is now for release of paraphimotic scar  tissue.   DESCRIPTION OF PROCEDURE:  With the patient in the supine position, general  LMA anesthesia introduced. He remained in this position, where the pubis was  prepped with Betadine solution and draped in the usual fashion.   I reviewed this case with Dr. Vernie Ammons and Dr. Brunilda Payor. Examine under anesthesia  revealed that the patient has paraphimosis, and edema of the distal  foreskin. I have squeezed the distal  foreskin, and reduced the edema, but I  still am not able to bring the foreskin back over the glans. Therefore, two  incisions were made, circumferentially around the penis at the level of the  tightest paraphimotic area and this skin removed, subcutaneous tissue  dissected. Old hematomas was identified and this is released. The corpora  appears healthy, the skin edges appeared healthy. Additional distal foreskin  is then removed, and a formal circumcision approached, with four separate  quadrant sutures of 5-0 Monocryl suture. Each quadrant was then closed with  interrupted 5-0 Monocryl. The patient tolerated  the procedure well. A sterile dressing was applied. The case was performed  under IV antibiotics. PAS hose were used during the procedure. He tolerated  the procedure well, was awakened and taken to the recovery room in good  condition. Dr. Brunilda Payor provided intraoperative consultation.      Sigmund I. Patsi Sears, M.D.  Electronically Signed     SIT/MEDQ  D:  10/10/2005  T:  10/10/2005  Job:  [id number]

## 2011-06-29 ENCOUNTER — Ambulatory Visit (INDEPENDENT_AMBULATORY_CARE_PROVIDER_SITE_OTHER): Payer: Medicare PPO | Admitting: Internal Medicine

## 2011-06-29 ENCOUNTER — Encounter: Payer: Self-pay | Admitting: Internal Medicine

## 2011-06-29 DIAGNOSIS — M79603 Pain in arm, unspecified: Secondary | ICD-10-CM

## 2011-06-29 DIAGNOSIS — M79609 Pain in unspecified limb: Secondary | ICD-10-CM

## 2011-06-29 LAB — MAGNESIUM: Magnesium: 2.3 mg/dL (ref 1.5–2.5)

## 2011-06-29 NOTE — Assessment & Plan Note (Addendum)
Unclear etiology of episodes as described in the history of present illness, no classic symptoms of heart disease, radiculopathy et Karie Soda.  seizure disorder? We agreed to monitor the symptoms, if they get more frequent or intense, will do further investigation. Check a Mg level, see next

## 2011-06-29 NOTE — Patient Instructions (Signed)
Call if symptoms increase or no better in few weeks

## 2011-06-29 NOTE — Assessment & Plan Note (Signed)
History of hypomagnesemia, an over-the-counter supplements. Labs

## 2011-06-29 NOTE — Progress Notes (Signed)
  Subjective:    Patient ID: Philip [name], male    DOB: 06/28/1941, 70 y.o.   MRN: [id number]  HPI In the last month, he had three  1-minute episodes of bilateral shooting arm pain not associated with nausea, neck pain, confusion, tongue biting. 2 of the episodes were at rest, , one was when  he was trying to shampoo his hair. During this episode his arms were shaking.     Past Medical History  Diagnosis Date  . Hyperlipidemia   . Hypertension   . CAD (coronary artery disease)     had a MI , s/p CABG  . Skin cancer     several , one of them was melanoma (sees derm routinely)  . OSA (obstructive sleep apnea)     started CPAP 12/09   Past Surgical History  Procedure Date  . Coronary artery bypass graft 1999  . Penile prosthesis implant 08/2005     Review of Systems No blurred vision, diplopia or slurred speech. No otherwise  tingling or upper extremities paresthesias No chest pain or palpitations No bladder or bowel incontinence No major problems with neck pain the    Objective:   Physical Exam  Constitutional: He is oriented to person, place, and time. He appears well-developed and well-nourished.  HENT:  Head: Normocephalic and atraumatic.  Eyes: EOM are normal. Pupils are equal, round, and reactive to light.  Cardiovascular: Normal rate and regular rhythm.   No murmur heard. Pulmonary/Chest: Breath sounds normal. No respiratory distress. He has no wheezes. He has no rales.  Musculoskeletal: He exhibits no edema.  Neurological: He is alert and oriented to person, place, and time. Abnormal reflex: DTRs decreased throughout but symmetric. No cranial nerve deficit. He exhibits normal muscle tone. Coordination normal.  Psychiatric: He has a normal mood and affect. His behavior is normal. Judgment and thought content normal.          Assessment & Plan:

## 2011-09-15 ENCOUNTER — Ambulatory Visit (INDEPENDENT_AMBULATORY_CARE_PROVIDER_SITE_OTHER): Payer: Medicare PPO

## 2011-09-15 DIAGNOSIS — Z23 Encounter for immunization: Secondary | ICD-10-CM

## 2012-08-22 ENCOUNTER — Institutional Professional Consult (permissible substitution): Payer: Medicare PPO | Admitting: Pulmonary Disease

## 2012-09-14 ENCOUNTER — Ambulatory Visit (INDEPENDENT_AMBULATORY_CARE_PROVIDER_SITE_OTHER): Payer: Medicare PPO | Admitting: Internal Medicine

## 2012-09-14 ENCOUNTER — Encounter: Payer: Self-pay | Admitting: Internal Medicine

## 2012-09-14 VITALS — BP 146/88 | HR 50 | Temp 98.1°F | Ht 70.25 in | Wt 240.0 lb

## 2012-09-14 DIAGNOSIS — Z125 Encounter for screening for malignant neoplasm of prostate: Secondary | ICD-10-CM

## 2012-09-14 DIAGNOSIS — I2510 Atherosclerotic heart disease of native coronary artery without angina pectoris: Secondary | ICD-10-CM

## 2012-09-14 DIAGNOSIS — Z0000 Encounter for general adult medical examination without abnormal findings: Secondary | ICD-10-CM

## 2012-09-14 DIAGNOSIS — Z23 Encounter for immunization: Secondary | ICD-10-CM

## 2012-09-14 DIAGNOSIS — Z8042 Family history of malignant neoplasm of prostate: Secondary | ICD-10-CM

## 2012-09-14 DIAGNOSIS — I10 Essential (primary) hypertension: Secondary | ICD-10-CM

## 2012-09-14 DIAGNOSIS — G4733 Obstructive sleep apnea (adult) (pediatric): Secondary | ICD-10-CM

## 2012-09-14 DIAGNOSIS — E785 Hyperlipidemia, unspecified: Secondary | ICD-10-CM

## 2012-09-14 LAB — COMPREHENSIVE METABOLIC PANEL
ALT: 37 U/L (ref 0–53)
AST: 34 U/L (ref 0–37)
Albumin: 4.4 g/dL (ref 3.5–5.2)
Alkaline Phosphatase: 62 U/L (ref 39–117)
BUN: 15 mg/dL (ref 6–23)
CO2: 30 mEq/L (ref 19–32)
Calcium: 9.2 mg/dL (ref 8.4–10.5)
Chloride: 105 mEq/L (ref 96–112)
Creatinine, Ser: 1.1 mg/dL (ref 0.4–1.5)
GFR: 72.14 mL/min (ref >60.00)
Glucose, Bld: 109 mg/dL — ABNORMAL HIGH (ref 70–99)
Potassium: 4.5 mEq/L (ref 3.5–5.1)
Total Bilirubin: 0.9 mg/dL (ref 0.3–1.2)

## 2012-09-14 LAB — LIPID PANEL
Cholesterol: 149 mg/dL (ref 0–200)
HDL: 35.6 mg/dL — ABNORMAL LOW (ref >39.00)
Total CHOL/HDL Ratio: 4
Triglycerides: 197 mg/dL — ABNORMAL HIGH (ref 0.0–149.0)
VLDL: 39.4 mg/dL (ref 0.0–40.0)

## 2012-09-14 LAB — CBC WITH DIFFERENTIAL/PLATELET
Basophils Absolute: 0 10*3/uL (ref 0.0–0.1)
Basophils Relative: 0.7 % (ref 0.0–3.0)
Eosinophils Absolute: 0.1 10*3/uL (ref 0.0–0.7)
Eosinophils Relative: 2.2 % (ref 0.0–5.0)
Hemoglobin: 15.7 g/dL (ref 13.0–17.0)
Lymphocytes Relative: 34.3 % (ref 12.0–46.0)
MCHC: 33.6 g/dL (ref 30.0–36.0)
Monocytes Absolute: 0.4 10*3/uL (ref 0.1–1.0)
Monocytes Relative: 8.5 % (ref 3.0–12.0)
Neutro Abs: 2.5 10*3/uL (ref 1.4–7.7)
Neutrophils Relative %: 54.3 % (ref 43.0–77.0)
Platelets: 159 10*3/uL (ref 150.0–400.0)
RDW: 13 % (ref 11.5–14.6)
WBC: 4.6 10*3/uL (ref 4.5–10.5)

## 2012-09-14 LAB — PSA: PSA: 1.88 ng/mL (ref 0.10–4.00)

## 2012-09-14 MED ORDER — ZOSTER VACCINE LIVE 19400 UNT/0.65ML ~~LOC~~ SOLR: 0.6500 mL | Freq: Once | SUBCUTANEOUS | Status: DC

## 2012-09-14 MED ORDER — HYDROCHLOROTHIAZIDE 12.5 MG PO TABS: 12.5000 mg | ORAL_TABLET | Freq: Every day | ORAL | Status: DC

## 2012-09-14 NOTE — Assessment & Plan Note (Signed)
Asymptomatic, sees cardiology in [facility], Dr. Timoteo Gaul regularly

## 2012-09-14 NOTE — Assessment & Plan Note (Addendum)
Td  < 10 years Flu shot-- today pneumonia shot 2006 and 2011 zostavax-- benefits discussed Rx provided  Several Cscopes, had one in 2007 aprox. polyps x 6 , last 03-2009, hyperplastic polyps, next per [facility] (5 years) Diet and exercise discussed He has a family history of prostate cancer, DRE normal, check PSA.

## 2012-09-14 NOTE — Assessment & Plan Note (Signed)
Takes Mg supplements, we are starting a diuretic, will check a BMP and a magnesium level in 3 weeks

## 2012-09-14 NOTE — Assessment & Plan Note (Signed)
Ambulatory BPs around 146/88. Goal 120/80. Add HCTZ, see instructions

## 2012-09-14 NOTE — Patient Instructions (Signed)
Start hydrochlorothiazide 12.5 mg, take 2 tablets daily, in the morning. May decrease to one tablet daily if BP is too low. Schedule a lab appointment, 3 weeks from today: BMP, magnesium, dx hypertension. Check the  blood pressure 2 or 3 times a week, be sure it is between 110/60 and 140/80. If it is consistently higher or lower, let me know     Fall Prevention and Home Safety Falls cause injuries and can affect all age groups. It is possible to use preventive measures to significantly decrease the likelihood of falls. There are many simple measures which can make your home safer and prevent falls. OUTDOORS  Repair cracks and edges of walkways and driveways.  Remove high doorway thresholds.  Trim shrubbery on the main path into your home.  Have good outside lighting.  Clear walkways of tools, rocks, debris, and clutter.  Check that handrails are not broken and are securely fastened. Both sides of steps should have handrails.  Have leaves, snow, and ice cleared regularly.  Use sand or salt on walkways during winter months.  In the garage, clean up grease or oil spills. BATHROOM  Install night lights.  Install grab bars by the toilet and in the tub and shower.  Use non-skid mats or decals in the tub or shower.  Place a plastic non-slip stool in the shower to sit on, if needed.  Keep floors dry and clean up all water on the floor immediately.  Remove soap buildup in the tub or shower on a regular basis.  Secure bath mats with non-slip, double-sided rug tape.  Remove throw rugs and tripping hazards from the floors. BEDROOMS  Install night lights.  Make sure a bedside light is easy to reach.  Do not use oversized bedding.  Keep a telephone by your bedside.  Have a firm chair with side arms to use for getting dressed.  Remove throw rugs and tripping hazards from the floor. KITCHEN  Keep handles on pots and pans turned toward the center of the stove. Use back  burners when possible.  Clean up spills quickly and allow time for drying.  Avoid walking on wet floors.  Avoid hot utensils and knives.  Position shelves so they are not too high or low.  Place commonly used objects within easy reach.  If necessary, use a sturdy step stool with a grab bar when reaching.  Keep electrical cables out of the way.  Do not use floor polish or wax that makes floors slippery. If you must use wax, use non-skid floor wax.  Remove throw rugs and tripping hazards from the floor. STAIRWAYS  Never leave objects on stairs.  Place handrails on both sides of stairways and use them. Fix any loose handrails. Make sure handrails on both sides of the stairways are as long as the stairs.  Check carpeting to make sure it is firmly attached along stairs. Make repairs to worn or loose carpet promptly.  Avoid placing throw rugs at the top or bottom of stairways, or properly secure the rug with carpet tape to prevent slippage. Get rid of throw rugs, if possible.  Have an electrician put in a light switch at the top and bottom of the stairs. OTHER FALL PREVENTION TIPS  Wear low-heel or rubber-soled shoes that are supportive and fit well. Wear closed toe shoes.  When using a stepladder, make sure it is fully opened and both spreaders are firmly locked. Do not climb a closed stepladder.  Add color or contrast  paint or tape to grab bars and handrails in your home. Place contrasting color strips on first and last steps.  Learn and use mobility aids as needed. Install an electrical emergency response system.  Turn on lights to avoid dark areas. Replace light bulbs that burn out immediately. Get light switches that glow.  Arrange furniture to create clear pathways. Keep furniture in the same place.  Firmly attach carpet with non-skid or double-sided tape.  Eliminate uneven floor surfaces.  Select a carpet pattern that does not visually hide the edge of steps.  Be  aware of all pets. OTHER HOME SAFETY TIPS  Set the water temperature for 120 F (48.8 C).  Keep emergency numbers on or near the telephone.  Keep smoke detectors on every level of the home and near sleeping areas. Document Released: 10/07/2002 Document Revised: 04/17/2012 Document Reviewed: 01/06/2012 [facility] Patient Information 2013 [place], [place].

## 2012-09-14 NOTE — Assessment & Plan Note (Signed)
Good compliance with CPAP 

## 2012-09-14 NOTE — Assessment & Plan Note (Signed)
Due for labs

## 2012-09-14 NOTE — Progress Notes (Signed)
Subjective:    Patient ID: Philip [name], male    DOB: February 07, 1941, 71 y.o.   MRN: [id number]  HPI Here for Medicare AWV: 1. Risk factors based on Past M, S, F history: reviewed 2. Physical Activities: sedentary lately , wife sick  3. Depression/mood:  Screen neg  4. Hearing: slt decreased, does not affect ADL, + tinnitus x years, was eval before by a hearing specialist 5. ADL's: independent  6. Fall Risk: no recent problems , see instructions  7. home Safety: does feel safe at home  8. Height, weight, &visual acuity: see VS, recently seen by specialist, early cataracts 9. Counseling: provided 10. Labs ordered based on risk factors: if needed  11. Referral Coordination: if needed 12.  Care Plan, see assessment and plan  13.   Cognitive Assessment:normal cognition and motor skills   In addition, today we discussed the following: OSA, good compliance with CPAP History of skin cancer, sees dermatology every 6 months History of heart disease, good medication compliance, sees cardiology in [facility] regularly. Hypertension, good medication compliance, ambulatory BPs are around 145/88. he would like to see his BP better.  Past Medical History  Diagnosis Date  . Hyperlipidemia   . Hypertension   . CAD (coronary artery disease)     had a MI , s/p CABG  . Skin cancer     several , one of them was melanoma (sees derm routinely)  . OSA (obstructive sleep apnea)     started CPAP 12/09   Past Surgical History  Procedure Date  . Coronary artery bypass graft 1999  . Penile prosthesis implant 08/2005   History   Social History  . Marital Status: Married    Spouse Name: N/A    Number of Children: 3  . Years of Education: N/A   Occupational History  . retired, Research officer, political party    Social History Main Topics  . Smoking status: Former Smoker    Quit date: 09/14/1977  . Smokeless tobacco: Never Used  . Alcohol Use: Yes     Comment: socially  . Drug Use: No  . Sexually Active: Not  on file   Other Topics Concern  . Not on file   Social History Narrative   1 son, 2 step children---Exercise: little---Diet: room for improvment   Family History  Problem Relation Age of Onset  . Sleep apnea Brother   . Colon cancer      aunt  . Prostate cancer Father 42  . Heart disease Mother     M and F  . Skin cancer Mother     M, F, B, S  . Diabetes Neg Hx   . Stroke Neg Hx      Review of Systems No chest pain or shortness of breath No nausea, vomiting, diarrhea. From time to time he sees drops of blood in the stools after hard BM. No dysuria or difficulty urinating    Objective:   Physical Exam General -- alert, well-developed    Neck --no thyromegaly , normal carotid pulse Lungs -- normal respiratory effort, no intercostal retractions, no accessory muscle use, and normal breath sounds.   Heart-- normal rate, regular rhythm, no murmur, and no gallop.   Abdomen--soft, non-tender, no distention, no masses, no HSM, no guarding, and no rigidity. No bruit  Extremities-- no pretibial edema bilaterally Rectal-- No external abnormalities noted. Normal sphincter tone. No rectal masses or tenderness. Brown stool  Prostate:  Prostate gland firm and smooth, no enlargement,  nodularity, tenderness, mass, asymmetry or induration. Neurologic-- alert & oriented X3 and strength normal in all extremities. Psych-- Cognition and judgment appear intact. Alert and cooperative with normal attention span and concentration.  not anxious appearing and not depressed appearing.       Assessment & Plan:   Today , I spent more than 25 min with the patient, in addition to the CPX, managing his chronic medical problems.  Total time ~ 55 min

## 2012-09-17 ENCOUNTER — Encounter: Payer: Self-pay | Admitting: *Deleted

## 2013-05-29 ENCOUNTER — Ambulatory Visit (INDEPENDENT_AMBULATORY_CARE_PROVIDER_SITE_OTHER): Payer: Medicare PPO | Admitting: Cardiology

## 2013-05-29 ENCOUNTER — Encounter: Payer: Self-pay | Admitting: *Deleted

## 2013-05-29 VITALS — BP 152/90 | HR 57 | Ht 70.5 in | Wt 238.0 lb

## 2013-05-29 DIAGNOSIS — I2510 Atherosclerotic heart disease of native coronary artery without angina pectoris: Secondary | ICD-10-CM

## 2013-05-29 DIAGNOSIS — I10 Essential (primary) hypertension: Secondary | ICD-10-CM

## 2013-05-29 DIAGNOSIS — E785 Hyperlipidemia, unspecified: Secondary | ICD-10-CM

## 2013-05-29 MED ORDER — AMLODIPINE BESYLATE 2.5 MG PO TABS: 2.5000 mg | ORAL_TABLET | Freq: Every day | ORAL | Status: DC

## 2013-05-29 NOTE — Patient Instructions (Signed)
Start amlodipine 2.5mg  daily.   Your physician recommends that you return for a FASTING lipid profile /BMET.   Your physician has requested that you regularly monitor and record your blood pressure readings at home. Please use the same machine at the same time of day to check your readings and record them. I will call you in about 3 weeks to get the readings.   Your physician wants you to follow-up in: 6 months with Dr Shirlee Latch. (January 2015).  You will receive a reminder letter in the mail two months in advance. If you don't receive a letter, please call our office to schedule the follow-up appointment.

## 2013-05-30 NOTE — Progress Notes (Signed)
Patient ID: Philip [name], male   DOB: 1941/05/11, 72 y.o.   MRN: [id number] PCP: Dr. Drue Novel  72 yo with history of CAD s/p CABG in 1999 presents to establish outpatient cardiology followup.  He was followed by cardiology in [facility] in the past.  Last cath was in 2009, all grafts were patent. He has been doing well recently.  He walks for exercise and plays golf.  No exertional dyspnea or chest pain.  Some generalized fatigue but not limiting.  No syncope or tachypalpitations.  He has OSA and uses his CPAP. BP has been running high.  In 11/13 he was started on HCTZ but felt "bad" taking it and has stopped it. At home, SBP running 140s-150s.    ECG: NSR, IVCD with QRS 118 msec, nonspecific ST abnormalities.   Labs (11/13): K 4.5, creatinine 1.1, LDL 74, HDL 36  PMH: 1. Hyperlipidemia 2. HTN 3. CAD s/p CABG 1999 with LIMA-LAD, SVG-LCx, SVG-RCA.  LHC 2009 with 3/3 grafts patent.  4. H/o melanoma 5. OSA on CPAP  SH: Retired, married, 3 kids, quit smoking in 1978.  Lives in [place].   FH: Mother with CAD.   ROS: All systems reviewed and negative except as per HPI.   Current Outpatient Prescriptions  Medication Sig Dispense Refill  . atorvastatin (LIPITOR) 20 MG tablet Take 20 mg by mouth daily.        Marland Kitchen lisinopril (PRINIVIL,ZESTRIL) 20 MG tablet Take 20 mg by mouth 2 (two) times daily.       . metoprolol (TOPROL-XL) 200 MG 24 hr tablet Take 200 mg by mouth daily.        Marland Kitchen amLODipine (NORVASC) 2.5 MG tablet Take 1 tablet (2.5 mg total) by mouth daily.  90 tablet  3  . aspirin EC 81 MG tablet Take 1 tablet (81 mg total) by mouth daily.       No current facility-administered medications for this visit.    BP 152/90  Pulse 57  Ht 5' 10.5" (1.791 m)  Wt 107.956 kg (238 lb)  BMI 33.66 kg/m2 General: NAD Neck: No JVD, no thyromegaly or thyroid nodule.  Lungs: Clear to auscultation bilaterally with normal respiratory effort. CV: Nondisplaced PMI.  Heart regular S1/S2, no S3/S4, no  murmur.  Trace ankle edema.  No carotid bruit.  Normal pedal pulses.  Abdomen: Soft, nontender, no hepatosplenomegaly, no distention.  Skin: Intact without lesions or rashes.  Neurologic: Alert and oriented x 3.  Psych: Normal affect. Extremities: No clubbing or cyanosis.  HEENT: Normal.   Assessment/Plan 1. CAD: s/p CABG in 1999, last cath in 2009 with patent grafts.  No ischemic symptoms.  Continue current regimen of ASA 81, statin, ACEI, and Toprol XL.  2. HTN: BP is running high.  He did not tolerate HCTZ well.  Start amlodipine 2.5 mg daily.  He will check BP daily and record, and we will call in 2 wks for BP readings.  3. Hyperlipidemia: Check lipids.  Continue atorvastatin.    Followup in 6 months.  Marca Ancona 05/30/2013

## 2013-06-03 ENCOUNTER — Other Ambulatory Visit (INDEPENDENT_AMBULATORY_CARE_PROVIDER_SITE_OTHER): Payer: Medicare PPO

## 2013-06-03 ENCOUNTER — Other Ambulatory Visit: Payer: Self-pay | Admitting: *Deleted

## 2013-06-03 DIAGNOSIS — I2510 Atherosclerotic heart disease of native coronary artery without angina pectoris: Secondary | ICD-10-CM

## 2013-06-03 LAB — LIPID PANEL
LDL Cholesterol: 53 mg/dL (ref 0–99)
Total CHOL/HDL Ratio: 3

## 2013-06-03 LAB — BASIC METABOLIC PANEL
Chloride: 106 mEq/L (ref 96–112)
Creatinine, Ser: 1.2 mg/dL (ref 0.4–1.5)
Potassium: 4.4 mEq/L (ref 3.5–5.1)
Sodium: 140 mEq/L (ref 135–145)

## 2013-06-03 MED ORDER — LISINOPRIL 20 MG PO TABS: 20.0000 mg | ORAL_TABLET | Freq: Two times a day (BID) | ORAL | Status: DC

## 2013-06-19 ENCOUNTER — Telehealth: Payer: Self-pay | Admitting: *Deleted

## 2013-06-19 MED ORDER — AMLODIPINE BESYLATE 5 MG PO TABS: 5.0000 mg | ORAL_TABLET | Freq: Every day | ORAL | Status: DC

## 2013-06-19 NOTE — Telephone Encounter (Signed)
05/29/13 HTN: BP is running high. He did not tolerate HCTZ well. Start amlodipine 2.5 mg daily. He will check BP daily and record, and we will call in 2 wks for BP readings.   06/19/13--per wife BP readings: starting 06/05/13 -06/18/13 ---- 135/74 139/66 121/77 145/80 145/71 144/80 145/86 137/83 145/77 142/85 131/75 138/71 (most recent). I will forward to Dr Shirlee Latch for review.

## 2013-06-19 NOTE — Telephone Encounter (Signed)
Better BP still a little high.  Think he can tolerate going up to 5 mg daily amlodipine with call for BP check in 2 wks.

## 2013-06-19 NOTE — Telephone Encounter (Signed)
Pt's wife advised, verbalized understanding.

## 2013-07-03 ENCOUNTER — Telehealth: Payer: Self-pay | Admitting: *Deleted

## 2013-07-03 NOTE — Telephone Encounter (Signed)
Laurey Morale, MD at 06/19/2013 10:59 AM                Better BP still a little high. Think he can tolerate going up to 5 mg daily amlodipine with call for BP check in 2 wks.    07/03/13 pt states he increased amlodipine to 5 mg daily 06/22/13 BP readings starting 06/22/13 to 07/02/13 --149/78 06/23/13/ 150/71 06/24/13  140/85 140/78 120/66 142/77 130/61 125/66 110/51 116/65. Pt feels fine without complaints. I will forward to Dr Shirlee Latch for review.

## 2013-07-03 NOTE — Telephone Encounter (Signed)
Pt.notified

## 2013-07-03 NOTE — Telephone Encounter (Signed)
Looks ok, no changes 

## 2013-10-01 ENCOUNTER — Ambulatory Visit (INDEPENDENT_AMBULATORY_CARE_PROVIDER_SITE_OTHER): Payer: Medicare PPO

## 2013-10-01 DIAGNOSIS — Z23 Encounter for immunization: Secondary | ICD-10-CM

## 2013-10-31 HISTORY — PX: CARDIAC CATHETERIZATION: SHX172

## 2013-11-25 ENCOUNTER — Ambulatory Visit (INDEPENDENT_AMBULATORY_CARE_PROVIDER_SITE_OTHER): Payer: Medicare PPO | Admitting: Cardiology

## 2013-11-25 ENCOUNTER — Encounter: Payer: Self-pay | Admitting: Cardiology

## 2013-11-25 VITALS — BP 138/80 | HR 58 | Ht 70.5 in | Wt 227.0 lb

## 2013-11-25 DIAGNOSIS — R5381 Other malaise: Secondary | ICD-10-CM

## 2013-11-25 DIAGNOSIS — R5383 Other fatigue: Secondary | ICD-10-CM

## 2013-11-25 DIAGNOSIS — E785 Hyperlipidemia, unspecified: Secondary | ICD-10-CM

## 2013-11-25 DIAGNOSIS — I2510 Atherosclerotic heart disease of native coronary artery without angina pectoris: Secondary | ICD-10-CM

## 2013-11-25 DIAGNOSIS — I10 Essential (primary) hypertension: Secondary | ICD-10-CM

## 2013-11-25 LAB — CBC WITH DIFFERENTIAL/PLATELET
BASOS PCT: 0.7 % (ref 0.0–3.0)
Basophils Absolute: 0 10*3/uL (ref 0.0–0.1)
Eosinophils Absolute: 0.1 10*3/uL (ref 0.0–0.7)
Eosinophils Relative: 1.6 % (ref 0.0–5.0)
HCT: 40.7 % (ref 39.0–52.0)
HEMOGLOBIN: 13.4 g/dL (ref 13.0–17.0)
Lymphocytes Relative: 35.7 % (ref 12.0–46.0)
Lymphs Abs: 2.2 10*3/uL (ref 0.7–4.0)
MCHC: 33.1 g/dL (ref 30.0–36.0)
MCV: 91.4 fl (ref 78.0–100.0)
MONOS PCT: 9.2 % (ref 3.0–12.0)
Monocytes Absolute: 0.6 10*3/uL (ref 0.1–1.0)
NEUTROS ABS: 3.2 10*3/uL (ref 1.4–7.7)
Neutrophils Relative %: 52.8 % (ref 43.0–77.0)
Platelets: 164 10*3/uL (ref 150.0–400.0)
RBC: 4.45 Mil/uL (ref 4.22–5.81)
RDW: 13.3 % (ref 11.5–14.6)
WBC: 6 10*3/uL (ref 4.5–10.5)

## 2013-11-25 LAB — BASIC METABOLIC PANEL
BUN: 17 mg/dL (ref 6–23)
CHLORIDE: 105 meq/L (ref 96–112)
CO2: 28 meq/L (ref 19–32)
CREATININE: 1 mg/dL (ref 0.4–1.5)
Calcium: 8.9 mg/dL (ref 8.4–10.5)
GFR: 74.3 mL/min (ref >60.00)
Glucose, Bld: 101 mg/dL — ABNORMAL HIGH (ref 70–99)
Potassium: 4 mEq/L (ref 3.5–5.1)
Sodium: 139 mEq/L (ref 135–145)

## 2013-11-25 LAB — TSH: TSH: 3.33 u[IU]/mL (ref 0.35–5.50)

## 2013-11-25 LAB — LIPID PANEL
CHOL/HDL RATIO: 3
Cholesterol: 113 mg/dL (ref 0–200)
HDL: 37 mg/dL — ABNORMAL LOW (ref >39.00)
LDL CALC: 54 mg/dL (ref 0–99)
Triglycerides: 110 mg/dL (ref 0.0–149.0)
VLDL: 22 mg/dL (ref 0.0–40.0)

## 2013-11-25 MED ORDER — METOPROLOL SUCCINATE ER 100 MG PO TB24: 100.0000 mg | ORAL_TABLET | Freq: Every day | ORAL | Status: DC

## 2013-11-25 MED ORDER — AMLODIPINE BESYLATE 10 MG PO TABS: 10.0000 mg | ORAL_TABLET | Freq: Every day | ORAL | Status: DC

## 2013-11-25 NOTE — Progress Notes (Signed)
Patient ID: Philip [name], male   DOB: 11/05/1940, 73 y.o.   MRN: [id number] PCP: Dr. Larose Kells  73 yo with history of CAD s/p CABG in 1999 presents for cardiology followup.  Last cath was in 2009 in [facility], all grafts were patent.  He walks for exercise and plays golf once a week.  He works out on an elliptical.  No exertional dyspnea or chest pain.  He has some generalized fatigue that may be a bit worse than in the past.  No syncope or tachypalpitations.  He has OSA and uses his CPAP.  BP is now under reasonable control.     ECG: NSR, LVH, inferior TWIs, PVC   Labs (11/13): K 4.5, creatinine 1.1, LDL 74, HDL 36 Labs (8/14): K 4.4, creatinine 1.2, LDL 53, HDL 36  PMH: 1. Hyperlipidemia 2. HTN 3. CAD s/p CABG 1999 with LIMA-LAD, SVG-LCx, SVG-RCA.  [place] 2009 with 3/3 grafts patent.  4. H/o melanoma 5. OSA on CPAP  SH: Retired, married, 3 kids, quit smoking in 1978.  Lives in [place].   FH: Mother with CAD.   ROS: All systems reviewed and negative except as per HPI.   Current Outpatient Prescriptions  Medication Sig Dispense Refill  . aspirin EC 81 MG tablet Take 1 tablet (81 mg total) by mouth daily.      Marland Kitchen atorvastatin (LIPITOR) 40 MG tablet Take 40 mg by mouth daily.      Marland Kitchen lisinopril (PRINIVIL,ZESTRIL) 20 MG tablet Take 1 tablet (20 mg total) by mouth 2 (two) times daily.  180 tablet  3  . amLODipine (NORVASC) 10 MG tablet Take 1 tablet (10 mg total) by mouth daily.  90 tablet  3  . metoprolol succinate (TOPROL-XL) 100 MG 24 hr tablet Take 1 tablet (100 mg total) by mouth daily. Take with or immediately following a meal.  90 tablet  3   No current facility-administered medications for this visit.    BP 138/80  Pulse 58  Ht 5' 10.5" (1.791 m)  Wt 102.967 kg (227 lb)  BMI 32.10 kg/m2 General: NAD Neck: No JVD, no thyromegaly or thyroid nodule.  Lungs: Clear to auscultation bilaterally with normal respiratory effort. CV: Nondisplaced PMI.  Heart regular S1/S2, no S3/S4,  no murmur.  No edema.  No carotid bruit.  Normal pedal pulses.  Abdomen: Soft, nontender, no hepatosplenomegaly, no distention.  Skin: Intact without lesions or rashes.  Neurologic: Alert and oriented x 3.  Psych: Normal affect. Extremities: No clubbing or cyanosis.   Assessment/Plan 1. CAD: s/p CABG in 1999, last cath in 2009 with patent grafts.  No ischemic symptoms.  Continue current regimen of ASA 81, statin, ACEI, and Toprol XL.  2. HTN: BP is under reasonable control.  3. Hyperlipidemia: Check lipids.  Continue atorvastatin.   4. Fatigue: Generalized.  I will have him get CBC to look for anemia and TSH today.  He is on a high dose of Toprol XL which could be contributing to fatigue.  I will also his Toprol XL down to 100 mg daily and will instead go up on amlodipine to 10 mg daily to make sure that BP does not go up significantly.  He also needs to increase his activity level.    [name] 11/25/2013

## 2013-11-25 NOTE — Patient Instructions (Addendum)
Increase amlodipine to 10mg  daily. You can take 2 of your 5mg  tablets daily at the same time and use your current supply.  Decrease Toprol XL (metoprolol succinate) to 100mg  daily. You can take 1/2 of your 200mg  tablet and use your current supply.  Your physician recommends that you return for lab work today--BMET/CBCd/TSH/Lipid profile.  Your physician wants you to follow-up in: 6 months with Dr [name] Dubin. (July 2016).  You will receive a reminder letter in the mail two months in advance. If you don't receive a letter, please call our office to schedule the follow-up appointment.

## 2013-12-03 ENCOUNTER — Telehealth: Payer: Self-pay | Admitting: *Deleted

## 2013-12-03 NOTE — Telephone Encounter (Signed)
Mailed copy of labs and left message to call if any questions  

## 2013-12-03 NOTE — Telephone Encounter (Signed)
Message copied by Earvin Hansen on Tue Dec 03, 2013  1:32 PM ------      Message from: [name]      Created: Sun Dec 01, 2013  9:57 PM       Good labs ------

## 2014-03-03 ENCOUNTER — Other Ambulatory Visit: Payer: Self-pay | Admitting: *Deleted

## 2014-03-03 MED ORDER — ATORVASTATIN CALCIUM 40 MG PO TABS: 40.0000 mg | ORAL_TABLET | Freq: Every day | ORAL | Status: DC

## 2014-04-01 ENCOUNTER — Telehealth: Payer: Self-pay | Admitting: Internal Medicine

## 2014-04-01 NOTE — Telephone Encounter (Signed)
Patient needs a Referral to see Dr [name] on  04/16/2014 for his annual check up for skin cancer   [employer] UV#[phone]

## 2014-04-03 NOTE — Telephone Encounter (Signed)
Referral sent to [facility]. Awaiting approval.

## 2014-04-04 ENCOUNTER — Encounter: Payer: Self-pay | Admitting: Internal Medicine

## 2014-04-04 NOTE — Telephone Encounter (Signed)
Pt aware.

## 2014-04-04 NOTE — Telephone Encounter (Signed)
[facility] Josem Kaufmann # [id number]  Valid 04/16/14-10/16/14  # visits: 6

## 2014-04-21 ENCOUNTER — Encounter: Payer: Self-pay | Admitting: *Deleted

## 2014-04-21 ENCOUNTER — Telehealth: Payer: Self-pay | Admitting: *Deleted

## 2014-04-21 NOTE — Telephone Encounter (Signed)
Medication List and allergies: Reviewed and updated  90 Day supply/mail order:  [facility] Local prescriptions: [facility] [place]  Immunizations Due:  Tdap  A/P FH, PSH, or Personal Hx: Reviewed and updated Flu vaccine: 09/2013 Tdap: Can't remember when the last one was PNA: 12/2009  Shingles: Never CCS: 03/2009 polyps Repeat 04/2014 PSA: 08/2012 1.88  To Discuss with Leomia Blake: Not at this time.

## 2014-04-22 ENCOUNTER — Ambulatory Visit (INDEPENDENT_AMBULATORY_CARE_PROVIDER_SITE_OTHER): Payer: Commercial Managed Care - HMO | Admitting: Internal Medicine

## 2014-04-22 ENCOUNTER — Encounter: Payer: Self-pay | Admitting: Internal Medicine

## 2014-04-22 VITALS — BP 110/66 | HR 58 | Temp 98.2°F | Resp 16 | Ht 70.0 in | Wt 228.5 lb

## 2014-04-22 DIAGNOSIS — Z23 Encounter for immunization: Secondary | ICD-10-CM

## 2014-04-22 DIAGNOSIS — Z0000 Encounter for general adult medical examination without abnormal findings: Secondary | ICD-10-CM

## 2014-04-22 DIAGNOSIS — I10 Essential (primary) hypertension: Secondary | ICD-10-CM

## 2014-04-22 DIAGNOSIS — E785 Hyperlipidemia, unspecified: Secondary | ICD-10-CM

## 2014-04-22 DIAGNOSIS — R5383 Other fatigue: Secondary | ICD-10-CM

## 2014-04-22 DIAGNOSIS — R5381 Other malaise: Secondary | ICD-10-CM

## 2014-04-22 DIAGNOSIS — I2510 Atherosclerotic heart disease of native coronary artery without angina pectoris: Secondary | ICD-10-CM

## 2014-04-22 DIAGNOSIS — Z125 Encounter for screening for malignant neoplasm of prostate: Secondary | ICD-10-CM

## 2014-04-22 DIAGNOSIS — R5382 Chronic fatigue, unspecified: Secondary | ICD-10-CM

## 2014-04-22 NOTE — Progress Notes (Signed)
Subjective:    Patient ID: Philip [name], male    DOB: 04/21/1941, 73 y.o.   MRN: [id number]  DOS:  04/22/2014 Type of  Visit:   Here for Medicare AWV: 1. Risk factors based on Past M, S, F history: reviewed 2. Physical Activities: little exercise, wife w/ severe DJD 3. Depression/mood:  Screen neg   4. Hearing: slt decreased, does not affect ADL, + tinnitus x years, was eval before by a hearing specialist, sx stable 5. ADL's: independent   6. Fall Risk: no recent problems , see instructions   7. home Safety: does feel safe at home   8. Height, weight, &visual acuity: see VS, recently seen by specialist, early cataracts, about the same  13. Counseling: provided 10. Labs ordered based on risk factors: if needed   11. Referral Coordination: if needed 12.  Care Plan, see assessment and plan   13.   Cognitive Assessment: normal cognition and motor skills   In addition, today we discussed the following: CAD-- saw  cardiology few months ago, felt to be stable they did adjust his blood pressure medication. Fatigue, ongoing symptom, few months ago cardiology decreased Toprol dose and increase amlodipine hopping  to decrease fatigue; his BP remained normal and apparently it helped a little bit w/ energy High cholesterol, good compliance with medications without apparent side effects Skin cancer, sees dermatology routinely  ROS Denies chest pain or difficulty breathing. No lower extremity edema. No nausea, vomiting, diarrhea. Blood in the stools? 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 Denies cough, sputum production,  wheezing. No dysuria, nocturia, difficulty   urinating or urinary frequency  Past Medical History  Diagnosis Date  . Hyperlipidemia   . Hypertension   . CAD (coronary artery disease)     had a MI , s/p CABG  . Skin cancer     several , one of them was melanoma (sees derm routinely)  . OSA (obstructive sleep apnea)     started CPAP 12/09    Past Surgical History    Procedure Laterality Date  . Coronary artery bypass graft  1999  . Penile prosthesis implant  08/2005  . Rhinoplasty  1975    History   Social History  . Marital Status: Married    Spouse Name: N/A    Number of Children: 3  . Years of Education: N/A   Occupational History  . retired, Actor    Social History Main Topics  . Smoking status: Former Smoker    Quit date: 09/14/1977  . Smokeless tobacco: Never Used  . Alcohol Use: Yes     Comment: socially  . Drug Use: No  . Sexual Activity: Not on file   Other Topics Concern  . Not on file   Social History Narrative   1 son, 2 step children     Family History  Problem Relation Age of Onset  . Sleep apnea Brother   . Colon cancer Other     aunt  . Prostate cancer Father 38  . Heart disease Mother   . Skin cancer Mother   . Diabetes Neg Hx   . Stroke Neg Hx   . Heart disease Father   . Skin cancer Father   . Skin cancer Brother   . Skin cancer Sister        Medication List       This list is accurate as of: 04/22/14  5:44 PM.  Always use your most recent  med list.               amLODipine 10 MG tablet  Commonly known as:  NORVASC  Take 1 tablet (10 mg total) by mouth daily.     aspirin EC 81 MG tablet  Take 1 tablet (81 mg total) by mouth daily.     atorvastatin 40 MG tablet  Commonly known as:  LIPITOR  Take 1 tablet (40 mg total) by mouth daily.     lisinopril 20 MG tablet  Commonly known as:  PRINIVIL,ZESTRIL  Take 1 tablet (20 mg total) by mouth 2 (two) times daily.     metoprolol succinate 100 MG 24 hr tablet  Commonly known as:  TOPROL-XL  Take 1 tablet (100 mg total) by mouth daily. Take with or immediately following a meal.           Objective:   Physical Exam BP 110/66  Pulse 58  Temp(Src) 98.2 F (36.8 C) (Oral)  Resp 16  Ht 5\' 10"  (1.778 m)  Wt 228 lb 8 oz (103.647 kg)  BMI 32.79 kg/m2  SpO2 98% General -- alert, well-developed, NAD.  Neck --no thyromegaly ,  normal carotid pulse  HEENT-- Not pale.  Lungs -- normal respiratory effort, no intercostal retractions, no accessory muscle use, and normal breath sounds.  Heart-- normal rate, regular rhythm, no murmur.  Abdomen-- Not distended, good bowel sounds,soft, non-tender. Palpable, nontender aorta at the upper abdomen Rectal-- No external abnormalities noted. Normal sphincter tone. No rectal masses or tenderness. Stool brown, Hemoccult negative  Prostate--Prostate gland firm and smooth, no enlargement, nodularity, tenderness, mass, asymmetry or induration. Extremities-- trace pretibial edema bilaterally  Neurologic--  alert & oriented X3. Speech normal, gait appropriate for age, strength symmetric and appropriate for age.   Psych-- Cognition and judgment appear intact. Cooperative with normal attention span and concentration. No anxious or depressed appearing.      Assessment & Plan:

## 2014-04-22 NOTE — Assessment & Plan Note (Signed)
Seems to be doing very well

## 2014-04-22 NOTE — Patient Instructions (Signed)
Get your blood work before you leave   Next visit is for a physical exam in 1 year, fasting Please make an appointment    At some point this year the clinic will relocate to  [place],  [address] (10 minutes form here)  [place], [place] [address] [phone]      Fall Prevention and [place] cause injuries and can affect all age groups. It is possible to use preventive measures to significantly decrease the likelihood of falls. There are many simple measures which can make your home safer and prevent falls. OUTDOORS  Repair cracks and edges of walkways and driveways.  Remove high doorway thresholds.  Trim shrubbery on the main path into your home.  Have good outside lighting.  Clear walkways of tools, rocks, debris, and clutter.  Check that handrails are not broken and are securely fastened. Both sides of steps should have handrails.  Have leaves, snow, and ice cleared regularly.  Use sand or salt on walkways during winter months.  In the garage, clean up grease or oil spills. BATHROOM  Install night lights.  Install grab bars by the toilet and in the tub and shower.  Use non-skid mats or decals in the tub or shower.  Place a plastic non-slip stool in the shower to sit on, if needed.  Keep floors dry and clean up all water on the floor immediately.  Remove soap buildup in the tub or shower on a regular basis.  Secure bath mats with non-slip, double-sided rug tape.  Remove throw rugs and tripping hazards from the floors. BEDROOMS  Install night lights.  Make sure a bedside light is easy to reach.  Do not use oversized bedding.  Keep a telephone by your bedside.  Have a firm chair with side arms to use for getting dressed.  Remove throw rugs and tripping hazards from the floor. KITCHEN  Keep handles on pots and pans turned toward the center of the stove. Use back burners when  possible.  Clean up spills quickly and allow time for drying.  Avoid walking on wet floors.  Avoid hot utensils and knives.  Position shelves so they are not too high or low.  Place commonly used objects within easy reach.  If necessary, use a sturdy step stool with a grab bar when reaching.  Keep electrical cables out of the way.  Do not use floor polish or wax that makes floors slippery. If you must use wax, use non-skid floor wax.  Remove throw rugs and tripping hazards from the floor. STAIRWAYS  Never leave objects on stairs.  Place handrails on both sides of stairways and use them. Fix any loose handrails. Make sure handrails on both sides of the stairways are as long as the stairs.  Check carpeting to make sure it is firmly attached along stairs. Make repairs to worn or loose carpet promptly.  Avoid placing throw rugs at the top or bottom of stairways, or properly secure the rug with carpet tape to prevent slippage. Get rid of throw rugs, if possible.  Have an electrician put in a light switch at the top and bottom of the stairs. OTHER FALL PREVENTION TIPS  Wear low-heel or rubber-soled shoes that are supportive and fit well. Wear closed toe shoes.  When using a stepladder, make sure it is fully opened and both spreaders are firmly locked. Do not climb a closed stepladder.  Add color or contrast paint or tape to grab bars and handrails in your home. Place contrasting color strips on first and last steps.  Learn and use mobility aids as needed. Install an electrical emergency response system.  Turn on lights to avoid dark areas. Replace light bulbs that burn out immediately. Get light switches that glow.  Arrange furniture to create clear pathways. Keep furniture in the same place.  Firmly attach carpet with non-skid or double-sided tape.  Eliminate uneven floor surfaces.  Select a carpet pattern that does not visually hide the edge of steps.  Be aware of all  pets. OTHER HOME SAFETY TIPS  Set the water temperature for 120 F (48.8 C).  Keep emergency numbers on or near the telephone.  Keep smoke detectors on every level of the home and near sleeping areas. Document Released: 10/07/2002 Document Revised: 04/17/2012 Document Reviewed: 01/06/2012 [facility] Patient Information 2015 [place], [place]. This information is not intended to replace advice given to you by your health care Ella Guillotte. Make sure you discuss any questions you have with your health care Alleen Kehm.

## 2014-04-22 NOTE — Assessment & Plan Note (Addendum)
Labs  On chronic supplements

## 2014-04-22 NOTE — Assessment & Plan Note (Addendum)
Td -- today pneumonia shot 2006 and 2011 prevnar --Today zostavax-- benefits discussed before and a Rx was provided   Several Cscopes, had one in 2007 aprox. polyps x 6 , last 03-2009, hyperplastic polyps, next cscope is due----refer to if  [facility]  Diet and exercise discussed He has a family history of prostate cancer, DRE normal, check PSA. Palpable A check a  Ultrasound, r/o AAA

## 2014-04-22 NOTE — Assessment & Plan Note (Signed)
Ongoing issue but sx not severe, recent CBC normal. Plan: Labs-- E28, folic acid, vitamin D, TSH (last TSH was trending up)

## 2014-04-22 NOTE — Progress Notes (Signed)
Pre visit review using our clinic review tool, if applicable. No additional management support is needed unless otherwise documented below in the visit note. 

## 2014-04-22 NOTE — Assessment & Plan Note (Signed)
Well controlled with present medications, check a BMP

## 2014-04-23 ENCOUNTER — Telehealth: Payer: Self-pay | Admitting: Internal Medicine

## 2014-04-23 LAB — BASIC METABOLIC PANEL
BUN: 25 mg/dL — ABNORMAL HIGH (ref 6–23)
CHLORIDE: 104 meq/L (ref 96–112)
CO2: 22 mEq/L (ref 19–32)
Calcium: 9.7 mg/dL (ref 8.4–10.5)
Creatinine, Ser: 1.6 mg/dL — ABNORMAL HIGH (ref 0.4–1.5)
GFR: 46.14 mL/min — AB (ref >60.00)
Glucose, Bld: 87 mg/dL (ref 70–99)
POTASSIUM: 4.6 meq/L (ref 3.5–5.1)
Sodium: 138 mEq/L (ref 135–145)

## 2014-04-23 LAB — FOLATE

## 2014-04-23 LAB — VITAMIN B12: VITAMIN B 12: 562 pg/mL (ref 211–911)

## 2014-04-23 LAB — AST: AST: 29 U/L (ref 0–37)

## 2014-04-23 LAB — TSH: TSH: 3.86 u[IU]/mL (ref 0.35–4.50)

## 2014-04-23 LAB — ALT: ALT: 29 U/L (ref 0–53)

## 2014-04-23 LAB — MAGNESIUM: MAGNESIUM: 2.1 mg/dL (ref 1.5–2.5)

## 2014-04-23 LAB — VITAMIN D 25 HYDROXY (VIT D DEFICIENCY, FRACTURES): VITD: 41.32 ng/mL

## 2014-04-23 LAB — PSA: PSA: 3.2 ng/mL (ref 0.10–4.00)

## 2014-04-23 NOTE — Addendum Note (Signed)
Addended by: Peggyann Shoals on: 04/23/2014 05:27 PM   Modules accepted: Orders

## 2014-04-23 NOTE — Telephone Encounter (Signed)
Relevant patient education assigned to patient using Emmi. ° °

## 2014-04-24 ENCOUNTER — Other Ambulatory Visit: Payer: Medicare PPO

## 2014-04-24 ENCOUNTER — Ambulatory Visit
Admission: RE | Admit: 2014-04-24 | Discharge: 2014-04-24 | Disposition: A | Discharge status: Home / Self Care | Payer: Commercial Managed Care - HMO | Source: Ambulatory Visit | Attending: Internal Medicine | Admitting: Internal Medicine

## 2014-04-24 DIAGNOSIS — Z0000 Encounter for general adult medical examination without abnormal findings: Secondary | ICD-10-CM

## 2014-04-25 ENCOUNTER — Telehealth: Payer: Self-pay | Admitting: Internal Medicine

## 2014-04-25 DIAGNOSIS — I714 Abdominal aortic aneurysm, without rupture, unspecified: Secondary | ICD-10-CM

## 2014-04-25 NOTE — Telephone Encounter (Signed)
Pt called ultrasound showed Abdominal aortic aneurysm. Maximal dimensions of 6.2 x 7 cm. Diagnosis discussed with the patient, plan: Referred to vascular surgery ER if abdominal or back pain.

## 2014-04-29 ENCOUNTER — Encounter: Payer: Self-pay | Admitting: Vascular Surgery

## 2014-04-30 ENCOUNTER — Ambulatory Visit (INDEPENDENT_AMBULATORY_CARE_PROVIDER_SITE_OTHER): Payer: Commercial Managed Care - HMO | Admitting: Vascular Surgery

## 2014-04-30 ENCOUNTER — Encounter: Payer: Self-pay | Admitting: Vascular Surgery

## 2014-04-30 VITALS — BP 142/70 | HR 58 | Resp 18 | Ht 69.0 in | Wt 230.0 lb

## 2014-04-30 DIAGNOSIS — I714 Abdominal aortic aneurysm, without rupture, unspecified: Secondary | ICD-10-CM | POA: Insufficient documentation

## 2014-04-30 DIAGNOSIS — Z01810 Encounter for preprocedural cardiovascular examination: Secondary | ICD-10-CM

## 2014-04-30 NOTE — Addendum Note (Signed)
Addended by: Mena Goes on: 04/30/2014 01:34 PM   Modules accepted: Orders

## 2014-04-30 NOTE — Progress Notes (Signed)
Patient ID: Philip [name], male   DOB: 01-01-1941, 73 y.o.   MRN: [id number]  Reason for Consult: 7 cm abdominal aortic aneurysm   Referred by Colon Branch, MD  Subjective:     HPI:  Philip [name] is a 73 y.o. male who was undergoing a yearly physical exam and had a pulsatile abdominal mass. This prompted an ultrasound which was done on 04/24/2014 which showed a 7 cm infrarenal abdominal aortic aneurysm. He was referred for vascular consultation. He denies any abdominal pain or back pain.  He denies any family history of aneurysmal disease.  He does have a history of coronary artery disease and underwent previous CABG in 1999. He denies any history of heart failure. He denies any chest pain or chest pressure. He has not had a recent stress test.  Past Medical History  Diagnosis Date  . Hyperlipidemia   . Hypertension   . CAD (coronary artery disease)     had a MI , s/p CABG  . Skin cancer     several , one of them was melanoma (sees derm routinely)  . OSA (obstructive sleep apnea)     started CPAP 12/09  . Myocardial infarction   . Peripheral vascular disease   . AAA (abdominal aortic aneurysm)    Family History  Problem Relation Age of Onset  . Sleep apnea Brother   . Colon cancer Other     aunt  . Prostate cancer Father 51  . Heart disease Mother   . Skin cancer Mother   . Diabetes Neg Hx   . Stroke Neg Hx   . Heart disease Father   . Skin cancer Father   . Skin cancer Brother   . Skin cancer Sister    Past Surgical History  Procedure Laterality Date  . Coronary artery bypass graft  1999  . Penile prosthesis implant  08/2005  . Rhinoplasty  1975   Short Social History:  History  Substance Use Topics  . Smoking status: Former Smoker    Quit date: 09/14/1977  . Smokeless tobacco: Never Used  . Alcohol Use: 3.0 oz/week    2 Glasses of wine, 1 Cans of beer, 2 Shots of liquor per week     Comment: socially   No Known Allergies  Current Outpatient  Prescriptions  Medication Sig Dispense Refill  . amLODipine (NORVASC) 10 MG tablet Take 1 tablet (10 mg total) by mouth daily.  90 tablet  3  . aspirin EC 81 MG tablet Take 1 tablet (81 mg total) by mouth daily.      Marland Kitchen atorvastatin (LIPITOR) 40 MG tablet Take 1 tablet (40 mg total) by mouth daily.  90 tablet  1  . lisinopril (PRINIVIL,ZESTRIL) 20 MG tablet Take 20 mg by mouth daily.      . metoprolol succinate (TOPROL-XL) 100 MG 24 hr tablet Take 1 tablet (100 mg total) by mouth daily. Take with or immediately following a meal.  90 tablet  3   No current facility-administered medications for this visit.   Review of Systems  Constitutional: Negative for chills and fever.  Eyes: Negative for loss of vision.  Respiratory: Negative for cough and wheezing.  Cardiovascular: Positive for dyspnea with exertion. Negative for chest pain, chest tightness, claudication, orthopnea and palpitations.  GI: Negative for blood in stool and vomiting.  GU: Negative for dysuria and hematuria.  Musculoskeletal: Negative for leg pain, joint pain and myalgias.  Skin: Negative for rash and wound.  Neurological: Negative for dizziness and speech difficulty.  Hematologic: Negative for bruises/bleeds easily. Psychiatric: Negative for depressed mood.        Objective:  Objective  Filed Vitals:   04/30/14 1231  BP: 142/70  Pulse: 58  Resp: 18  Height: 5\' 9"  (1.753 m)  Weight: 230 lb (104.327 kg)   Body mass index is 33.95 kg/(m^2).  Physical Exam  Constitutional: He is oriented to person, place, and time. He appears well-developed and well-nourished.  HENT:  Head: Normocephalic and atraumatic.  Neck: Neck supple. No JVD present. No thyromegaly present.  Cardiovascular: Normal rate, regular rhythm and normal heart sounds.  Exam reveals no friction rub.   No murmur heard. Pulses:      Radial pulses are 2+ on the right side, and 2+ on the left side.       Femoral pulses are 2+ on the right side, and 2+  on the left side.      Popliteal pulses are 2+ on the right side, and 2+ on the left side.       Dorsalis pedis pulses are 2+ on the right side, and 0 on the left side.       Posterior tibial pulses are 2+ on the right side, and 2+ on the left side.  Pulmonary/Chest: Breath sounds normal. He has no wheezes. He has no rales.  Abdominal: Soft. Bowel sounds are normal. There is no tenderness.  He has a palpable abdominal aortic aneurysm  Musculoskeletal: Normal range of motion. He exhibits no edema.  Lymphadenopathy:    He has no cervical adenopathy.  Neurological: He is alert and oriented to person, place, and time. He has normal strength. No sensory deficit.  Skin: No lesion and no rash noted.  Psychiatric: He has a normal mood and affect.   Data: I have reviewed his ultrasound which shows that the maximum diameter of his aneurysm is 7 cm.  I have reviewed his records from Dr. Ethel Rana office. He has a history of coronary artery disease which has been stable. He has  Essential hypertension which is been under good control.      Assessment/Plan:    AAA (abdominal aortic aneurysm) without rupture This patient has an asymptomatic 7 cm infrarenal abdominal aortic aneurysm. Given the size of the aneurysm, the risk of rupture is 20% per year. I will need to obtain a CT angiogram in order to determine if he is a candidate for endovascular aneurysm repair. I have ordered a CT angiogram of the abdomen and pelvis with 3 mm cuts. We will then be able to determine our options for repairing the aneurysm. I have discussed the option of open versus endovascular repair and the potential advantages and disadvantages of each. In addition we will obtain preoperative cardiac clearance. Once his workup is complete we'll plan on elective repair of his abdominal aortic aneurysm. Fortunately he is not a smoker. He is on aspirin and is on a statin.     Angelia Mould MD [facility]

## 2014-04-30 NOTE — Assessment & Plan Note (Signed)
This patient has an asymptomatic 7 cm infrarenal abdominal aortic aneurysm. Given the size of the aneurysm, the risk of rupture is 20% per year. I will need to obtain a CT angiogram in order to determine if he is a candidate for endovascular aneurysm repair. I have ordered a CT angiogram of the abdomen and pelvis with 3 mm cuts. We will then be able to determine our options for repairing the aneurysm. I have discussed the option of open versus endovascular repair and the potential advantages and disadvantages of each. In addition we will obtain preoperative cardiac clearance. Once his workup is complete we'll plan on elective repair of his abdominal aortic aneurysm. Fortunately he is not a smoker. He is on aspirin and is on a statin.

## 2014-05-07 ENCOUNTER — Encounter: Payer: Self-pay | Admitting: Cardiology

## 2014-05-07 ENCOUNTER — Ambulatory Visit (INDEPENDENT_AMBULATORY_CARE_PROVIDER_SITE_OTHER): Payer: Commercial Managed Care - HMO | Admitting: Cardiology

## 2014-05-07 VITALS — BP 154/90 | HR 101 | Ht 69.0 in | Wt 231.0 lb

## 2014-05-07 DIAGNOSIS — Z01810 Encounter for preprocedural cardiovascular examination: Secondary | ICD-10-CM | POA: Insufficient documentation

## 2014-05-07 DIAGNOSIS — I714 Abdominal aortic aneurysm, without rupture, unspecified: Secondary | ICD-10-CM

## 2014-05-07 DIAGNOSIS — I4891 Unspecified atrial fibrillation: Secondary | ICD-10-CM | POA: Insufficient documentation

## 2014-05-07 DIAGNOSIS — I480 Paroxysmal atrial fibrillation: Secondary | ICD-10-CM | POA: Insufficient documentation

## 2014-05-07 NOTE — Assessment & Plan Note (Signed)
Most likely due to the atrial fibrillation.

## 2014-05-07 NOTE — Assessment & Plan Note (Signed)
CABG in 1999, last cath 2009 and or 2011- stable.  Done in [facility]

## 2014-05-07 NOTE — Assessment & Plan Note (Signed)
Will do [employer] with 7 cm AAA and atrial fib that is new to eval coronary disease prior to surgery.

## 2014-05-07 NOTE — Assessment & Plan Note (Addendum)
First documentation of atrial, unsure how long he has had, he does have fatigue but is chronic PVCs his heartbeat has always been irregular. No shortness of breath no edema.  Discuss with Dr. Martinique with 7 cm abdominal aneurysm will not add anticoagulation at this time. After surgery we will readdress.  Will obtain echo to further evaluate LV function and valvular disease. Rate is controlled.

## 2014-05-07 NOTE — Patient Instructions (Signed)
Your physician recommends that you schedule a follow-up appointment in: Dr [name] Dubin or an APP next week or first availiable  Your physician has requested that you have an echocardiogram. Echocardiography is a painless test that uses sound waves to create images of your heart. It provides your doctor with information about the size and shape of your heart and how well your heart's chambers and valves are working. This procedure takes approximately one hour. There are no restrictions for this procedure.  Your physician has requested that you have a lexiscan myoview. For further information please visit [email or web address]. Please follow instruction sheet, as given.  Your physician has instructed no strenuous activities, no lifting weight or running but it's ok to play golf

## 2014-05-07 NOTE — Assessment & Plan Note (Signed)
For CT scan on Thursday to decide stent versus open surgery.

## 2014-05-07 NOTE — Progress Notes (Signed)
05/07/2014   PCP: Kathlene November, MD   Chief Complaint  Patient presents with  . Follow-up    clearence for surgery    Primary Cardiologist: Dr. [name] Dubin  HPI:  73 yo with history of CAD s/p CABG in 1999 presents for cardiology followup. Last cath was in 2009 in [facility], all grafts were patent. He walks for exercise and plays golf once a week.  He presents today for pre-surgical clearance for AAA of 7 cm.  He has seen Dr. Scot [name] and is scheduled for a CT of chest next week.    He was exercising and felt his heart beat in his abd.  Ultrasound found AAA 7 cm in diameter.  He denies any chest pain or SOB. He does have some fatigue.  EKG reveals atrial fib, new for this pt.  discussed with Dr. Martinique.  With AAA and upcoming surgery, will not start anticoagulation.  Will begin post op.   Pt unaware of his HR, he has frequent premature beats anyway. Unsure how long he has been in a. Fib.   No Known Allergies  Current Outpatient Prescriptions  Medication Sig Dispense Refill  . amLODipine (NORVASC) 10 MG tablet Take 1 tablet (10 mg total) by mouth daily.  90 tablet  3  . aspirin EC 81 MG tablet Take 1 tablet (81 mg total) by mouth daily.      Marland Kitchen atorvastatin (LIPITOR) 40 MG tablet Take 1 tablet (40 mg total) by mouth daily.  90 tablet  1  . lisinopril (PRINIVIL,ZESTRIL) 20 MG tablet Take 20 mg by mouth daily.      . metoprolol succinate (TOPROL-XL) 100 MG 24 hr tablet Take 1 tablet (100 mg total) by mouth daily. Take with or immediately following a meal.  90 tablet  3   No current facility-administered medications for this visit.    Past Medical History  Diagnosis Date  . Hyperlipidemia   . Hypertension   . CAD (coronary artery disease)     had a MI , s/p CABG  . Skin cancer     several , one of them was melanoma (sees derm routinely)  . OSA (obstructive sleep apnea)     started CPAP 12/09  . Myocardial infarction   . Peripheral vascular disease   . AAA (abdominal  aortic aneurysm)     Past Surgical History  Procedure Laterality Date  . Coronary artery bypass graft  1999  . Penile prosthesis implant  08/2005  . Rhinoplasty  1975    WEX:HBZJIRC:VE colds or fevers, no weight changes. + fatigue Skin:no rashes or ulcers HEENT:no blurred vision, no congestion CV:see HPI PUL:see HPI GI:no diarrhea constipation or melena, no indigestion GU:no hematuria, no dysuria MS:no joint pain, no claudication Neuro:no syncope, no lightheadedness Endo:no diabetes, no thyroid disease  Wt Readings from Last 3 Encounters:  05/07/14 231 lb (104.781 kg)  04/30/14 230 lb (104.327 kg)  04/22/14 228 lb 8 oz (103.647 kg)    PHYSICAL EXAM BP 154/90  Pulse 101  Ht 5\' 9"  (1.753 m)  Wt 231 lb (104.781 kg)  BMI 34.10 kg/m2 General:Pleasant affect, NAD Skin:Warm and dry, brisk capillary refill HEENT:normocephalic, sclera clear, mucus membranes moist Neck:supple, no JVD, no bruits, no adenopathy  Heart:S1S2 irreg irreg without murmur, gallup, rub or click Lungs:clear without rales, rhonchi, or wheezes LFY:BOFB, non tender, + BS, do not palpate liver spleen + pulsatile mass best felt with premature beats Ext:no lower ext  edema, 2+ post tib pulses, 2+ radial pulses Neuro:alert and oriented X 3, MAE, follows commands, + facial symmetry  NOT:RRNHAF fib with PVCs, rate controlled at 101, no acute changes other than atrial fib.  ASSESSMENT AND PLAN AAA (abdominal aortic aneurysm) without rupture  For CT scan on Thursday to decide stent versus open surgery.  Atrial fibrillation First documentation of atrial, unsure how long he has had, he does have fatigue but is chronic PVCs his heartbeat has always been irregular. No shortness of breath no edema.  Discuss with Dr. Martinique with 7 cm abdominal aneurysm will not add anticoagulation at this time. After surgery we will readdress.  Will obtain echo to further evaluate LV function and valvular disease. Rate is  controlled.  Fatigue Most likely due to the atrial fibrillation.  HYPERTENSION Elevated today on recheck was 140/70. He only checks his blood pressure daily her goal of being no higher than 790 systolic  CORONARY ARTERY DISEASE CABG in 1999, last cath 2009 and or 2011- stable.  Done in [facility]  Encounter for pre-operative cardiovascular clearance Will do Lexiscan myoview with 7 cm AAA and atrial fib that is new to eval coronary disease prior to surgery.       Will send copy of note to Dr. [name] Dubin.

## 2014-05-07 NOTE — Assessment & Plan Note (Signed)
Elevated today on recheck was 140/70. He only checks his blood pressure daily her goal of being no higher than 119 systolic

## 2014-05-08 NOTE — Progress Notes (Signed)
Thanks

## 2014-05-12 ENCOUNTER — Telehealth: Payer: Self-pay | Admitting: Vascular Surgery

## 2014-05-12 NOTE — Telephone Encounter (Signed)
Message copied by Gena Fray on Mon May 12, 2014 10:43 AM ------      Message from: Denman George      Created: Fri May 09, 2014  1:24 PM      Regarding: reschedule appt.      Contact: [phone]       Pt. Called to inquire about moving the f/u appt. With Dr. Scot [name] to date following his cardiac clearance.  Can we reschedule the CTA to 7/22, and move his CSD appt. To the 3:45 PM time on 7/22?   ------

## 2014-05-12 NOTE — Telephone Encounter (Signed)
Spoke with pt to reschedule his appointment to 05/21/14, dpm

## 2014-05-14 ENCOUNTER — Telehealth (HOSPITAL_COMMUNITY): Payer: Self-pay

## 2014-05-14 NOTE — Telephone Encounter (Signed)
Encounter complete. 

## 2014-05-15 ENCOUNTER — Other Ambulatory Visit: Payer: Commercial Managed Care - HMO

## 2014-05-15 ENCOUNTER — Ambulatory Visit: Payer: Commercial Managed Care - HMO | Admitting: Vascular Surgery

## 2014-05-16 ENCOUNTER — Ambulatory Visit (HOSPITAL_COMMUNITY)
Admission: RE | Admit: 2014-05-16 | Discharge: 2014-05-16 | Disposition: A | Discharge status: Home / Self Care | Payer: Medicare PPO | Source: Ambulatory Visit | Attending: Cardiology | Admitting: Cardiology

## 2014-05-16 ENCOUNTER — Ambulatory Visit (HOSPITAL_BASED_OUTPATIENT_CLINIC_OR_DEPARTMENT_OTHER)
Admission: RE | Admit: 2014-05-16 | Discharge: 2014-05-16 | Disposition: A | Discharge status: Home / Self Care | Payer: Medicare PPO | Source: Ambulatory Visit | Attending: Cardiology | Admitting: Cardiology

## 2014-05-16 DIAGNOSIS — I480 Paroxysmal atrial fibrillation: Secondary | ICD-10-CM

## 2014-05-16 DIAGNOSIS — I714 Abdominal aortic aneurysm, without rupture, unspecified: Secondary | ICD-10-CM

## 2014-05-16 DIAGNOSIS — R0609 Other forms of dyspnea: Secondary | ICD-10-CM | POA: Insufficient documentation

## 2014-05-16 DIAGNOSIS — Z951 Presence of aortocoronary bypass graft: Secondary | ICD-10-CM | POA: Insufficient documentation

## 2014-05-16 DIAGNOSIS — I2510 Atherosclerotic heart disease of native coronary artery without angina pectoris: Secondary | ICD-10-CM | POA: Insufficient documentation

## 2014-05-16 DIAGNOSIS — I4891 Unspecified atrial fibrillation: Secondary | ICD-10-CM

## 2014-05-16 DIAGNOSIS — I517 Cardiomegaly: Secondary | ICD-10-CM

## 2014-05-16 DIAGNOSIS — R0989 Other specified symptoms and signs involving the circulatory and respiratory systems: Principal | ICD-10-CM | POA: Insufficient documentation

## 2014-05-16 DIAGNOSIS — I252 Old myocardial infarction: Secondary | ICD-10-CM | POA: Insufficient documentation

## 2014-05-16 DIAGNOSIS — Z01810 Encounter for preprocedural cardiovascular examination: Secondary | ICD-10-CM

## 2014-05-16 DIAGNOSIS — R5381 Other malaise: Secondary | ICD-10-CM | POA: Insufficient documentation

## 2014-05-16 DIAGNOSIS — R5383 Other fatigue: Secondary | ICD-10-CM

## 2014-05-16 MED ORDER — TECHNETIUM TC 99M SESTAMIBI GENERIC - CARDIOLITE
30.9000 | Freq: Once | INTRAVENOUS | Status: AC | PRN
  Administered 2014-05-16: 30.9 via INTRAVENOUS

## 2014-05-16 MED ORDER — TECHNETIUM TC 99M SESTAMIBI GENERIC - CARDIOLITE
10.8000 | Freq: Once | INTRAVENOUS | Status: AC | PRN
  Administered 2014-05-16: 11 via INTRAVENOUS

## 2014-05-16 MED ORDER — REGADENOSON 0.4 MG/5ML IV SOLN
0.4000 mg | Freq: Once | INTRAVENOUS | Status: AC
  Administered 2014-05-16: 0.4 mg via INTRAVENOUS

## 2014-05-16 NOTE — Progress Notes (Signed)
2D Echocardiogram Complete.  05/16/2014   Hema Lanza Boaz, RDCS

## 2014-05-16 NOTE — Procedures (Addendum)
[facility] [address] [place] [address] [phone]  Cardiology Nuclear Med Study  Philip [name] is a 73 y.o. male     MRN : [id number]     DOB: 1941-10-05  Procedure Date: 05/16/2014  Nuclear Med Background Indication for Stress Test:  Surgical Clearance, Graft Patency and Abnormal EKG History:  CAD;MI;CABG-1999;PAF;Irregular HR;Last NUC MPI in 1999 Cardiac Risk Factors: Family History - CAD, History of Smoking, Hypertension, Lipids, Overweight and PVD  Symptoms:  DOE and Fatigue   Nuclear Pre-Procedure Caffeine/Decaff Intake:  7:00pm NPO After: 5:00am   IV Site: R Hand  IV 0.9% NS with Angio Cath:  22g  Chest Size (in):  44"  IV Started by: Rolene Course, RN  Height: 5\' 9"  (1.753 m)  Cup Size: n/a  BMI:  Body mass index is 34.1 kg/(m^2). Weight:  231 lb (104.781 kg)   Tech Comments:  n/a    Nuclear Med Study 1 or 2 day study: 1 day  Stress Test Type:  [place] Provider:  [name], MD   Resting Radionuclide: Technetium 23m Sestamibi  Resting Radionuclide Dose: 10.8 mCi   Stress Radionuclide:  Technetium 94m Sestamibi  Stress Radionuclide Dose: 30.9 mCi           Stress Protocol Rest HR: 58 Stress HR: 64  Rest BP: 142/89 Stress BP: 150/94  Exercise Time (min): n/a METS: n/a   Predicted Max HR: 147 bpm % Max HR: 55.1 bpm Rate Pressure Product: 63016  Dose of Adenosine (mg):  n/a Dose of Lexiscan: 0.4 mg  Dose of Atropine (mg): n/a Dose of Dobutamine: n/a mcg/kg/min (at max HR)  Stress Test Technologist: Leane Para, CCT Nuclear Technologist: Imagene Riches, CNMT   Rest Procedure:  Myocardial perfusion imaging was performed at rest 45 minutes following the intravenous administration of Technetium 38m Sestamibi. Stress Procedure:  The patient received IV Lexiscan 0.4 mg over 15-seconds.  Technetium 46m Sestamibi injected IV at 30-seconds.  There were no significant changes with  Lexiscan.  Quantitative spect images were obtained after a 45 minute delay.  Transient Ischemic Dilatation (Normal <1.22):  1.32  QGS EDV:  170 ml QGS ESV:  88 ml LV Ejection Fraction: 48%  Rest ECG: NSR - Normal EKG  Stress ECG: No significant change from baseline ECG and There are scattered PVCs.  QPS Raw Data Images:  Normal; no motion artifact; normal heart/lung ratio. Stress Images:  There is decreased uptake in the anterior wall. Rest Images:  There is decreased uptake in the apex. Subtraction (SDS):  Significant anterior reversible ischemia  Impression Exercise Capacity:  Lexiscan with no exercise. BP Response:  Normal blood pressure response. Clinical Symptoms:  No significant symptoms noted. ECG Impression:  There are scattered PVCs. Comparison with Prior Nuclear Study: No images to compare  Overall Impression:  Intermediate risk stress nuclear study with reversible anterior and apical ischemia and fixed basal to mid inferior defect.  LV Wall Motion:  Basal to mid inferior hypokinesis; EF 48%.  Pixie Casino, MD, [facility] Board Certified in Nuclear Cardiology Attending Cardiologist [place], MD  05/16/2014 12:58 PM

## 2014-05-20 ENCOUNTER — Ambulatory Visit (INDEPENDENT_AMBULATORY_CARE_PROVIDER_SITE_OTHER): Payer: Commercial Managed Care - HMO | Admitting: Cardiology

## 2014-05-20 ENCOUNTER — Encounter: Payer: Self-pay | Admitting: *Deleted

## 2014-05-20 ENCOUNTER — Encounter: Payer: Self-pay | Admitting: Cardiology

## 2014-05-20 ENCOUNTER — Other Ambulatory Visit: Payer: Self-pay | Admitting: *Deleted

## 2014-05-20 ENCOUNTER — Encounter: Payer: Self-pay | Admitting: Vascular Surgery

## 2014-05-20 VITALS — BP 108/65 | HR 67 | Ht 69.0 in | Wt 231.0 lb

## 2014-05-20 DIAGNOSIS — I10 Essential (primary) hypertension: Secondary | ICD-10-CM

## 2014-05-20 DIAGNOSIS — I4891 Unspecified atrial fibrillation: Secondary | ICD-10-CM

## 2014-05-20 DIAGNOSIS — E785 Hyperlipidemia, unspecified: Secondary | ICD-10-CM

## 2014-05-20 DIAGNOSIS — I480 Paroxysmal atrial fibrillation: Secondary | ICD-10-CM

## 2014-05-20 DIAGNOSIS — Z01812 Encounter for preprocedural laboratory examination: Secondary | ICD-10-CM

## 2014-05-20 DIAGNOSIS — I714 Abdominal aortic aneurysm, without rupture, unspecified: Secondary | ICD-10-CM

## 2014-05-20 DIAGNOSIS — I2510 Atherosclerotic heart disease of native coronary artery without angina pectoris: Secondary | ICD-10-CM

## 2014-05-20 NOTE — Patient Instructions (Addendum)
Your physician recommends that you return for lab work today--BMET/CBCd/PT/INR  Your physician recommends that you return for lab work on Monday July 27,2015--BMET. The lab is open from 7:30AM-5PM.   Your physician has requested that you have a cardiac catheterization. Cardiac catheterization is used to diagnose and/or treat various heart conditions. Doctors may recommend this procedure for a number of different reasons. The most common reason is to evaluate chest pain. Chest pain can be a symptom of coronary artery disease (CAD), and cardiac catheterization can show whether plaque is narrowing or blocking your heart's arteries. This procedure is also used to evaluate the valves, as well as measure the blood flow and oxygen levels in different parts of your heart. For further information please visit [email or web address]. Please follow instruction sheet, as given. July 30,2015  Your physician recommends that you schedule a follow-up appointment with Dr [name] Dubin about 2 weeks after the cardiac catheterization

## 2014-05-21 ENCOUNTER — Ambulatory Visit
Admission: RE | Admit: 2014-05-21 | Discharge: 2014-05-21 | Disposition: A | Discharge status: Home / Self Care | Payer: Commercial Managed Care - HMO | Source: Ambulatory Visit | Attending: Vascular Surgery | Admitting: Vascular Surgery

## 2014-05-21 ENCOUNTER — Ambulatory Visit (INDEPENDENT_AMBULATORY_CARE_PROVIDER_SITE_OTHER): Payer: Commercial Managed Care - HMO | Admitting: Vascular Surgery

## 2014-05-21 ENCOUNTER — Encounter: Payer: Self-pay | Admitting: Vascular Surgery

## 2014-05-21 ENCOUNTER — Encounter (HOSPITAL_COMMUNITY): Payer: Self-pay | Admitting: Pharmacy Technician

## 2014-05-21 VITALS — BP 129/72 | HR 58 | Ht 69.0 in | Wt 233.4 lb

## 2014-05-21 DIAGNOSIS — Z01810 Encounter for preprocedural cardiovascular examination: Secondary | ICD-10-CM

## 2014-05-21 DIAGNOSIS — I714 Abdominal aortic aneurysm, without rupture, unspecified: Secondary | ICD-10-CM

## 2014-05-21 LAB — PROTIME-INR
INR: 1 ratio (ref 0.8–1.0)
Prothrombin Time: 11.4 s (ref 9.6–13.1)

## 2014-05-21 LAB — CBC WITH DIFFERENTIAL/PLATELET
BASOS ABS: 0 10*3/uL (ref 0.0–0.1)
Basophils Relative: 0.3 % (ref 0.0–3.0)
EOS ABS: 0.1 10*3/uL (ref 0.0–0.7)
Eosinophils Relative: 0.9 % (ref 0.0–5.0)
HCT: 42.6 % (ref 39.0–52.0)
Hemoglobin: 14.1 g/dL (ref 13.0–17.0)
Lymphocytes Relative: 25.7 % (ref 12.0–46.0)
Lymphs Abs: 2.4 10*3/uL (ref 0.7–4.0)
MCHC: 33.2 g/dL (ref 30.0–36.0)
MCV: 89.5 fl (ref 78.0–100.0)
Monocytes Absolute: 0.9 10*3/uL (ref 0.1–1.0)
Monocytes Relative: 9.2 % (ref 3.0–12.0)
Neutro Abs: 6 10*3/uL (ref 1.4–7.7)
Neutrophils Relative %: 63.9 % (ref 43.0–77.0)
Platelets: 174 10*3/uL (ref 150.0–400.0)
RBC: 4.76 Mil/uL (ref 4.22–5.81)
RDW: 14.9 % (ref 11.5–15.5)
WBC: 9.4 10*3/uL (ref 4.0–10.5)

## 2014-05-21 LAB — BASIC METABOLIC PANEL
BUN: 28 mg/dL — ABNORMAL HIGH (ref 6–23)
CO2: 31 meq/L (ref 19–32)
CREATININE: 1.5 mg/dL (ref 0.4–1.5)
Calcium: 10 mg/dL (ref 8.4–10.5)
Chloride: 101 mEq/L (ref 96–112)
GFR: 49.38 mL/min — ABNORMAL LOW (ref >60.00)
Glucose, Bld: 92 mg/dL (ref 70–99)
Potassium: 4.5 mEq/L (ref 3.5–5.1)
Sodium: 140 mEq/L (ref 135–145)

## 2014-05-21 MED ORDER — IOHEXOL 350 MG/ML SOLN
60.0000 mL | Freq: Once | INTRAVENOUS | Status: AC | PRN
  Administered 2014-05-21: 60 mL via INTRAVENOUS

## 2014-05-21 NOTE — Assessment & Plan Note (Signed)
This patient has a 6.5 cm infrarenal abdominal aortic aneurysm. His risk of rupture is approximately 15% per year. I would recommend elective repair once his cardiac workup is complete. Based on his CT scan he will likely require open repair however I will review his films with Gore and with my partners. He has a short neck with reversed taper and also a short landing zone distally bilaterally. We will plan on scheduling surgery after his cardiac workup is complete and last he requires cardiac intervention.

## 2014-05-21 NOTE — Progress Notes (Signed)
[facility] and [facility]in Specialist of [facility]  Patient name: Philip [name] MRN: [id number] DOB: 10-21-1941 Sex: male  REASON FOR VISIT: Follow up after CT angiogram  HPI: Philip [name] is a 73 y.o. male who was undergoing a yearly physical exam and had a pulsatile abdominal mass. This prompted an ultrasound which was done on 04/24/2014 which showed a 7 cm infrarenal abdominal aortic aneurysm. He was referred for vascular consultation. He denies any abdominal pain or back pain. He denies any family history of aneurysmal disease. Set him up for a CT angiogram he comes in for follow up after this study.  He does have a history of coronary artery disease and underwent previous CABG in 1999. He denies any history of heart failure. He denies any chest pain or chest pressure. We sent him for preoperative cardiac evaluation in anticipation of elective aneurysm repair. At that visit he was noted to be in atrial fibrillation with a controlled rate. He was not started on anti-coagulation in anticipation of aneurysm repair in the near future. On his most recent visit with Dr. [name] Dubin, he was back in normal sinus rhythm. He underwent a Cardiolite study on 05/14/2014 which was intermediate risk showing a reversible anterior and apical perfusion defect and a fixed basal to mid inferior defect with an ejection fraction of 48%. Echo showed his EF to be 55-60%. Based on his Cardiolite, he scheduled for cardiac catheterization on 05/29/2014.  He denies any abdominal pain or back pain.  Past Medical History  Diagnosis Date  . Hyperlipidemia   . Hypertension   . CAD (coronary artery disease)     had a MI , s/p CABG  . Skin cancer     several , one of them was melanoma (sees derm routinely)  . OSA (obstructive sleep apnea)     started CPAP 12/09  . Myocardial infarction   . Peripheral vascular disease   . AAA (abdominal aortic aneurysm)    Family History  Problem Relation Age of Onset  . Sleep apnea Brother     . Colon cancer Other     aunt  . Prostate cancer Father 79  . Heart disease Mother   . Skin cancer Mother   . Diabetes Neg Hx   . Stroke Neg Hx   . Heart disease Father   . Skin cancer Father   . Skin cancer Brother   . Skin cancer Sister    SOCIAL HISTORY: History  Substance Use Topics  . Smoking status: Former Smoker    Quit date: 09/14/1977  . Smokeless tobacco: Never Used  . Alcohol Use: 3.0 oz/week    2 Glasses of wine, 1 Cans of beer, 2 Shots of liquor per week     Comment: socially   No Known Allergies Current Outpatient Prescriptions  Medication Sig Dispense Refill  . amLODipine (NORVASC) 10 MG tablet Take 1 tablet (10 mg total) by mouth daily.  90 tablet  3  . aspirin EC 81 MG tablet Take 1 tablet (81 mg total) by mouth daily.      Marland Kitchen atorvastatin (LIPITOR) 40 MG tablet Take 1 tablet (40 mg total) by mouth daily.  90 tablet  1  . B Complex-Biotin-FA (B-COMPLEX PO) Take 1 tablet by mouth daily.      . Cholecalciferol (VITAMIN D3) 5000 UNITS CAPS Take 5,000 Units by mouth daily.      Marland Kitchen co-enzyme Q-10 30 MG capsule Take 30 mg by mouth daily.      Marland Kitchen  lisinopril (PRINIVIL,ZESTRIL) 20 MG tablet Take 20 mg by mouth daily.      . magnesium oxide (MAG-OX) 400 MG tablet Take 800 mg by mouth daily.      . metoprolol succinate (TOPROL-XL) 100 MG 24 hr tablet Take 1 tablet (100 mg total) by mouth daily. Take with or immediately following a meal.  90 tablet  3  . Multiple Vitamins-Minerals (MULTIVITAMIN WITH MINERALS) tablet Take 1 tablet by mouth daily.      . Omega-3 Fatty Acids (FISH OIL) 1200 MG CAPS Take 1,200 mg by mouth daily.      Marland Kitchen omeprazole (PRILOSEC OTC) 20 MG tablet Take 20 mg by mouth daily.      . Probiotic Product (PROBIOTIC DAILY) CAPS Take 1 capsule by mouth daily.       No current facility-administered medications for this visit.   REVIEW OF SYSTEMS: Valu.Nieves ] denotes positive finding; [  ] denotes negative finding  CARDIOVASCULAR:  [ ]  chest pain   [ ]  chest  pressure   [ ]  palpitations   [ ]  orthopnea   [ ]  dyspnea on exertion   [ ]  claudication   [ ]  rest pain   [ ]  DVT   [ ]  phlebitis PULMONARY:   [ ]  productive cough   [ ]  asthma   [ ]  wheezing NEUROLOGIC:   [ ]  weakness  [ ]  paresthesias  [ ]  aphasia  [ ]  amaurosis  [ ]  dizziness HEMATOLOGIC:   [ ]  bleeding problems   [ ]  clotting disorders MUSCULOSKELETAL:  [ ]  joint pain   [ ]  joint swelling [ ]  leg swelling GASTROINTESTINAL: [ ]   blood in stool  [ ]   hematemesis GENITOURINARY:  [ ]   dysuria  [ ]   hematuria PSYCHIATRIC:  [ ]  history of major depression INTEGUMENTARY:  [ ]  rashes  [ ]  ulcers CONSTITUTIONAL:  [ ]  fever   [ ]  chills  PHYSICAL EXAM: Filed Vitals:   05/21/14 1605  BP: 129/72  Pulse: 58  Height: 5\' 9"  (1.753 m)  Weight: 233 lb 6.4 oz (105.87 kg)  SpO2: 97%   Constitutional:  He appears well-developed and well-nourished.  Cardiovascular: Normal rate, regular rhythm and normal heart sounds.  Pulses:  Radial pulses are 2+ on the right side, and 2+ on the left side.  Femoral pulses are 2+ on the right side, and 2+ on the left side.  Popliteal pulses are 2+ on the right side, and 2+ on the left side.  Dorsalis pedis pulses are 2+ on the right side, and 0 on the left side.  Posterior tibial pulses are 2+ on the right side, and 2+ on the left side.  Pulmonary/Chest: Breath sounds normal. He has no wheezes. He has no rales.  Abdominal: Soft. Bowel sounds are normal. There is no tenderness.  He has a palpable abdominal aortic aneurysm  Musculoskeletal: Normal range of motion. He exhibits no edema.   Body mass index is 34.45 kg/(m^2).  DATA:  I have reviewed his CT angiogram. The aneurysm measured 6.5 cm in maximum diameter. I have several concerns about the aneurysm and he may not be a candidate for endovascular aneurysm repair. First he has a short neck with reversed taper. Therefore he does not have an ideal landing zone for the proximal graft. Second his common iliac  arteries are very short before they bifurcate into the external iliac and internal iliac arteries. Thus there is not a good seal zone distally either. I  will review the films with Simeon Craft and also with my partners but suspect he will not be a candidate for EVAR.  MEDICAL ISSUES:  AAA (abdominal aortic aneurysm) without rupture This patient has a 6.5 cm infrarenal abdominal aortic aneurysm. His risk of rupture is approximately 15% per year. I would recommend elective repair once his cardiac workup is complete. Based on his CT scan he will likely require open repair however I will review his films with Gore and with my partners. He has a short neck with reversed taper and also a short landing zone distally bilaterally. We will plan on scheduling surgery after his cardiac workup is complete and last he requires cardiac intervention.   [facility] [place] Beeper: [phone]

## 2014-05-21 NOTE — Progress Notes (Signed)
Patient ID: Philip [name], male   DOB: 10/20/1941, 73 y.o.   MRN: [id number] PCP: Dr. Larose Kells  73 yo with history of CAD s/p CABG in 1999 presents for cardiology followup.  Last cath was in 2009 in [facility], all grafts were patent.  He walks for exercise and plays golf once a week.  He works out on an elliptical.  No exertional dyspnea or chest pain.  He has some generalized fatigue/lethargy that has been present without change for several years now.  No syncope or tachypalpitations.  He has OSA and uses his CPAP.  BP is now under reasonable control.     Recently, a number of issues have developed.  He noted at prominent abdominal pulsation when using his inversion table and told his PCP about this.  Therefore, he had an abdominal ultrasound with a 7 cm AAA noted. He was seen by [name] for discussion of repair and then referred back to this office for pre-operative evaluation.  He was seen by the PA in this office, and at that appointment was noted to be in atrial fibrillation with controlled rate.  He had not noticed any palpitations or change in his baseline symptoms. He was not started on anticoagulation given plan for AAA surgery in the near future.  Today, he is actually back in NSR.  He was sent for a pre-op Lexiscan Cardiolite in 7/15. This study was intermediate risk, showing a reversible anterior and apical perfusion defect and a fixed basal to mid inferior defect with EF 48%.  Echo was done to reassess EF.  This showed EF 55-60%.    ECG (05/07/14): Atrial fibrillation with PVCs ECG (05/20/14): NSR, RAE  Labs (11/13): K 4.5, creatinine 1.1, LDL 74, HDL 36 Labs (8/14): K 4.4, creatinine 1.2, LDL 53, HDL 36 Labs (1/15): LDL 54, HDL 37 Labs (6/15): K 4.6, creatinine 1.6  PMH: 1. Hyperlipidemia 2. HTN 3. CAD s/p CABG 1999 with LIMA-LAD, SVG-LCx, SVG-RCA.  [place] 2009 with 3/3 grafts patent. Lexiscan Cardiolite (7/15) with EF 48%, reversible anterior and apical perfusion defect and a fixed basal to  mid inferior defect.  Echo (7/15) with EF 55-60%, mild LAE.  4. H/o melanoma 5. OSA on CPAP 6. AAA: 7 cm, followed by Dr. Scot [name] 7. Atrial fibrillation: Paroxysmal, 1st noted in 7/15.  8. CKD  SH: Retired, married, 3 kids, quit smoking in 1978.  Lives in [place].   FH: Mother with CAD.   ROS: All systems reviewed and negative except as per HPI.   Current Outpatient Prescriptions  Medication Sig Dispense Refill  . amLODipine (NORVASC) 10 MG tablet Take 1 tablet (10 mg total) by mouth daily.  90 tablet  3  . aspirin EC 81 MG tablet Take 1 tablet (81 mg total) by mouth daily.      Marland Kitchen atorvastatin (LIPITOR) 40 MG tablet Take 1 tablet (40 mg total) by mouth daily.  90 tablet  1  . lisinopril (PRINIVIL,ZESTRIL) 20 MG tablet Take 20 mg by mouth daily.      . metoprolol succinate (TOPROL-XL) 100 MG 24 hr tablet Take 1 tablet (100 mg total) by mouth daily. Take with or immediately following a meal.  90 tablet  3   No current facility-administered medications for this visit.    BP 108/65  Pulse 67  Ht 5\' 9"  (1.753 m)  Wt 231 lb (104.781 kg)  BMI 34.10 kg/m2 General: NAD Neck: No JVD, no thyromegaly or thyroid nodule.  Lungs: Clear  to auscultation bilaterally with normal respiratory effort. CV: Nondisplaced PMI.  Heart regular S1/S2, no S3/S4, no murmur.  No edema.  No carotid bruit.  Normal pedal pulses.  Abdomen: Soft, nontender, no hepatosplenomegaly, no distention. Prominent abdominal aorta pulsation. Skin: Intact without lesions or rashes.  Neurologic: Alert and oriented x 3.  Psych: Normal affect. Extremities: No clubbing or cyanosis.   Assessment/Plan 1. CAD: s/p CABG in 1999, last cath in 2009 with patent grafts.  No exertional chest pain or dyspnea (only has chronic fatigue/lethargy).  However, he was set up for [employer] prior to vascular surgery (AAA repair) and was found to have an intermediate risk study with anterior and apical ischemia. - Continue  current regimen of ASA 81, statin, ACEI, and Toprol XL.  - I think that he is going to need a pre-operative cardiac cath given need for future vascular surgery and the intermediate risk stress test.  I will arrange for this next week.  Will use left radial access.  2. HTN: BP is under good control.  3. Hyperlipidemia: Continue atorvastatin.  4. AAA: 7 cm, needs repair.  He is planned for CT abdomen/pelvis tomorrow to assess the AAA and decide on open versus endovascular approach.  Unfortunately, recent BMET (6/15) showed creatinine up to 1.6.  Lisinopril was cut back.  I will recheck BMET today prior to CT.  5. CKD: Creatinine up to 1.6 in 6/15.  Cut back on lisinopril.  Will need repeat BMET prior to CT.  I will then recheck BMET early next week before cardiac cath. Avoid NSAIDs.  6. Atrial fibrillation: Paroxysmal.  He is back in NSR today.  CHADSVASC = 3.  I would like him to go on a NOAC eventually.  However, agree that this should wait until after AAA surgery as this will be soon.   [name] 05/21/2014

## 2014-05-22 ENCOUNTER — Other Ambulatory Visit: Payer: Self-pay | Admitting: *Deleted

## 2014-05-26 ENCOUNTER — Other Ambulatory Visit (INDEPENDENT_AMBULATORY_CARE_PROVIDER_SITE_OTHER): Payer: Commercial Managed Care - HMO

## 2014-05-26 DIAGNOSIS — I10 Essential (primary) hypertension: Secondary | ICD-10-CM

## 2014-05-26 DIAGNOSIS — I2510 Atherosclerotic heart disease of native coronary artery without angina pectoris: Secondary | ICD-10-CM

## 2014-05-26 LAB — BASIC METABOLIC PANEL
BUN: 16 mg/dL (ref 6–23)
CO2: 29 mEq/L (ref 19–32)
Calcium: 9 mg/dL (ref 8.4–10.5)
Chloride: 102 mEq/L (ref 96–112)
Creatinine, Ser: 1.1 mg/dL (ref 0.4–1.5)
GFR: 72.58 mL/min (ref >60.00)
Glucose, Bld: 97 mg/dL (ref 70–99)
Potassium: 4 mEq/L (ref 3.5–5.1)
SODIUM: 137 meq/L (ref 135–145)

## 2014-05-29 ENCOUNTER — Ambulatory Visit (HOSPITAL_COMMUNITY)
Admission: RE | Admit: 2014-05-29 | Discharge: 2014-05-29 | Disposition: A | Discharge status: Home / Self Care | Payer: Medicare PPO | Source: Ambulatory Visit | Attending: Cardiology | Admitting: Cardiology

## 2014-05-29 ENCOUNTER — Encounter (HOSPITAL_COMMUNITY)
Admission: RE | Disposition: A | Discharge status: Home / Self Care | Payer: Self-pay | Source: Ambulatory Visit | Attending: Cardiology

## 2014-05-29 DIAGNOSIS — I4891 Unspecified atrial fibrillation: Secondary | ICD-10-CM | POA: Diagnosis not present

## 2014-05-29 DIAGNOSIS — I129 Hypertensive chronic kidney disease with stage 1 through stage 4 chronic kidney disease, or unspecified chronic kidney disease: Secondary | ICD-10-CM | POA: Insufficient documentation

## 2014-05-29 DIAGNOSIS — Z951 Presence of aortocoronary bypass graft: Secondary | ICD-10-CM | POA: Diagnosis not present

## 2014-05-29 DIAGNOSIS — I714 Abdominal aortic aneurysm, without rupture, unspecified: Secondary | ICD-10-CM | POA: Diagnosis not present

## 2014-05-29 DIAGNOSIS — G4733 Obstructive sleep apnea (adult) (pediatric): Secondary | ICD-10-CM | POA: Insufficient documentation

## 2014-05-29 DIAGNOSIS — E785 Hyperlipidemia, unspecified: Secondary | ICD-10-CM | POA: Diagnosis not present

## 2014-05-29 DIAGNOSIS — I2510 Atherosclerotic heart disease of native coronary artery without angina pectoris: Secondary | ICD-10-CM | POA: Insufficient documentation

## 2014-05-29 DIAGNOSIS — N189 Chronic kidney disease, unspecified: Secondary | ICD-10-CM | POA: Diagnosis not present

## 2014-05-29 HISTORY — PX: LEFT HEART CATHETERIZATION WITH CORONARY/GRAFT ANGIOGRAM: SHX5450

## 2014-05-29 SURGERY — LEFT HEART CATHETERIZATION WITH CORONARY/GRAFT ANGIOGRAM
Anesthesia: LOCAL

## 2014-05-29 MED ORDER — SODIUM CHLORIDE 0.9 % IV SOLN: INTRAVENOUS | Status: DC

## 2014-05-29 MED ORDER — FENTANYL CITRATE 0.05 MG/ML IJ SOLN
INTRAMUSCULAR | Status: AC
  Filled 2014-05-29: qty 2

## 2014-05-29 MED ORDER — SODIUM CHLORIDE 0.9 % IV SOLN: 250.0000 mL | INTRAVENOUS | Status: DC | PRN

## 2014-05-29 MED ORDER — ACETAMINOPHEN 325 MG PO TABS: 650.0000 mg | ORAL_TABLET | ORAL | Status: DC | PRN

## 2014-05-29 MED ORDER — MIDAZOLAM HCL 2 MG/2ML IJ SOLN
INTRAMUSCULAR | Status: AC
  Filled 2014-05-29: qty 2

## 2014-05-29 MED ORDER — VERAPAMIL HCL 2.5 MG/ML IV SOLN
INTRAVENOUS | Status: AC
  Filled 2014-05-29: qty 2

## 2014-05-29 MED ORDER — HEPARIN SODIUM (PORCINE) 1000 UNIT/ML IJ SOLN
INTRAMUSCULAR | Status: AC
  Filled 2014-05-29: qty 1

## 2014-05-29 MED ORDER — LIDOCAINE HCL (PF) 1 % IJ SOLN
INTRAMUSCULAR | Status: AC
  Filled 2014-05-29: qty 30

## 2014-05-29 MED ORDER — HEPARIN (PORCINE) IN NACL 2-0.9 UNIT/ML-% IJ SOLN
INTRAMUSCULAR | Status: AC
  Filled 2014-05-29: qty 1500

## 2014-05-29 MED ORDER — ASPIRIN 81 MG PO CHEW
CHEWABLE_TABLET | ORAL | Status: AC
  Filled 2014-05-29: qty 1

## 2014-05-29 MED ORDER — ASPIRIN 81 MG PO CHEW
81.0000 mg | CHEWABLE_TABLET | ORAL | Status: AC
  Administered 2014-05-29: 81 mg via ORAL

## 2014-05-29 MED ORDER — SODIUM CHLORIDE 0.9 % IJ SOLN: 3.0000 mL | INTRAMUSCULAR | Status: DC | PRN

## 2014-05-29 MED ORDER — SODIUM CHLORIDE 0.9 % IV SOLN
INTRAVENOUS | Status: DC
  Administered 2014-05-29: 10:00:00 via INTRAVENOUS

## 2014-05-29 MED ORDER — ONDANSETRON HCL 4 MG/2ML IJ SOLN: 4.0000 mg | Freq: Four times a day (QID) | INTRAMUSCULAR | Status: DC | PRN

## 2014-05-29 MED ORDER — SODIUM CHLORIDE 0.9 % IJ SOLN: 3.0000 mL | Freq: Two times a day (BID) | INTRAMUSCULAR | Status: DC

## 2014-05-29 NOTE — Interval H&P Note (Signed)
Cath Lab Visit (complete for each Cath Lab visit)  Clinical Evaluation Leading to the Procedure:   ACS: No.  Non-ACS:    Anginal Classification: CCS I  Anti-ischemic medical therapy: Minimal Therapy (1 class of medications)  Non-Invasive Test Results: Intermediate-risk stress test findings: cardiac mortality 1-3%/year  Prior CABG: Previous CABG      History and Physical Interval Note:  05/29/2014 1:35 PM  Philip [name]  has presented today for surgery, with the diagnosis of CAD, abnormal myovview, surgical clearance  The various methods of treatment have been discussed with the patient and family. After consideration of risks, benefits and other options for treatment, the patient has consented to  Procedure(s): LEFT HEART CATHETERIZATION WITH CORONARY/GRAFT ANGIOGRAM (N/A) as a surgical intervention .  The patient's history has been reviewed, patient examined, no change in status, stable for surgery.  I have reviewed the patient's chart and labs.  Questions were answered to the patient's satisfaction.     Philip [name] [employer]

## 2014-05-29 NOTE — Progress Notes (Signed)
TR BAND REMOVAL  LOCATION:    left radial  DEFLATED PER PROTOCOL:    Yes.    TIME BAND OFF / DRESSING APPLIED:    1700    SITE UPON ARRIVAL:    Level 0  SITE AFTER BAND REMOVAL:    Level 0  REVERSE ALLEN'S TEST:     positive  CIRCULATION SENSATION AND MOVEMENT:    Within Normal Limits   Yes.

## 2014-05-29 NOTE — CV Procedure (Signed)
    Cardiac Catheterization Procedure Note  Name: Philip [name] MRN: [id number] DOB: 03-12-1941  Procedure: LIMA angiography, SVG angiography, Selective Coronary Angiography  Indication: Intermediate risk stress test prior to vascular surgery.    Procedural Details: The left wrist was prepped, draped, and anesthetized with 1% lidocaine. Using the modified Seldinger technique, a 5 French sheath was introduced into the left radial artery. 3 mg of verapamil was administered through the sheath, weight-based unfractionated heparin was administered intravenously. MP catheter, JR4 catheter, LIMA catheter, and JL4 catheters were used for angiography. Catheter exchanges were performed over an exchange length guidewire. There were no immediate procedural complications. A TR band was used for radial hemostasis at the completion of the procedure.  The patient was transferred to the post catheterization recovery area for further monitoring.  Procedural Findings: Hemodynamics: AO 146/75  Coronary angiography: Coronary dominance: right  Left mainstem: Totally occluded distal left main.  Left anterior descending (LAD): Totally occluded at the ostium.  LIMA to LAD patent.  This fills back to the proximal LAD.   Left circumflex (LCx): Totally occluded at the ostium. SVG-OM is patent.   Right coronary artery (RCA): Totally occluded proximal RCA.  The SVG-distal RCA is patent with good run-off into the PDA/PLV.   Left ventriculography: Not done.   Final Conclusions:  Native coronaries occluded but bypass grafts patent with no significant stenosis.  No intervention needed, will proceed with AAA repair.   [name] 05/29/2014, 2:33 PM

## 2014-05-29 NOTE — Discharge Instructions (Signed)
Radial Site Care °Refer to this sheet in the next few weeks. These instructions provide you with information on caring for yourself after your procedure. Your caregiver may also give you more specific instructions. Your treatment has been planned according to current medical practices, but problems sometimes occur. Call your caregiver if you have any problems or questions after your procedure. °HOME CARE INSTRUCTIONS °· You may shower the day after the procedure. Remove the bandage (dressing) and gently wash the site with plain soap and water. Gently pat the site dry. °· Do not apply powder or lotion to the site. °· Do not submerge the affected site in water for 3 to 5 days. °· Inspect the site at least twice daily. °· Do not flex or bend the affected arm for 24 hours. °· No lifting over 5 pounds (2.3 kg) for 5 days after your procedure. °· Do not drive home if you are discharged the same day of the procedure. Have someone else drive you. °· You may drive 24 hours after the procedure unless otherwise instructed by your caregiver. °· Do not operate machinery or power tools for 24 hours. °· A responsible adult should be with you for the first 24 hours after you arrive home. °What to expect: °· Any bruising will usually fade within 1 to 2 weeks. °· Blood that collects in the tissue (hematoma) may be painful to the touch. It should usually decrease in size and tenderness within 1 to 2 weeks. °SEEK IMMEDIATE MEDICAL CARE IF: °· You have unusual pain at the radial site. °· You have redness, warmth, swelling, or pain at the radial site. °· You have drainage (other than a small amount of blood on the dressing). °· You have chills. °· You have a fever or persistent symptoms for more than 72 hours. °· You have a fever and your symptoms suddenly get worse. °· Your arm becomes pale, cool, tingly, or numb. °· You have heavy bleeding from the site. Hold pressure on the site. °Document Released: 11/19/2010 Document Revised:  01/09/2012 Document Reviewed: 11/19/2010 °ExitCare® Patient Information ©2015 [employer]. This information is not intended to replace advice given to you by your health care provider. Make sure you discuss any questions you have with your health care provider. ° °

## 2014-05-29 NOTE — H&P (View-Only) (Signed)
Patient ID: Philip [name], male   DOB: 12-01-1940, 73 y.o.   MRN: [id number] PCP: Dr. Larose Kells  73 yo with history of CAD s/p CABG in 1999 presents for cardiology followup.  Last cath was in 2009 in [facility], all grafts were patent.  He walks for exercise and plays golf once a week.  He works out on an elliptical.  No exertional dyspnea or chest pain.  He has some generalized fatigue/lethargy that has been present without change for several years now.  No syncope or tachypalpitations.  He has OSA and uses his CPAP.  BP is now under reasonable control.     Recently, a number of issues have developed.  He noted at prominent abdominal pulsation when using his inversion table and told his PCP about this.  Therefore, he had an abdominal ultrasound with a 7 cm AAA noted. He was seen by [name] for discussion of repair and then referred back to this office for pre-operative evaluation.  He was seen by the PA in this office, and at that appointment was noted to be in atrial fibrillation with controlled rate.  He had not noticed any palpitations or change in his baseline symptoms. He was not started on anticoagulation given plan for AAA surgery in the near future.  Today, he is actually back in NSR.  He was sent for a pre-op Lexiscan Cardiolite in 7/15. This study was intermediate risk, showing a reversible anterior and apical perfusion defect and a fixed basal to mid inferior defect with EF 48%.  Echo was done to reassess EF.  This showed EF 55-60%.    ECG (05/07/14): Atrial fibrillation with PVCs ECG (05/20/14): NSR, RAE  Labs (11/13): K 4.5, creatinine 1.1, LDL 74, HDL 36 Labs (8/14): K 4.4, creatinine 1.2, LDL 53, HDL 36 Labs (1/15): LDL 54, HDL 37 Labs (6/15): K 4.6, creatinine 1.6  PMH: 1. Hyperlipidemia 2. HTN 3. CAD s/p CABG 1999 with LIMA-LAD, SVG-LCx, SVG-RCA.  [place] 2009 with 3/3 grafts patent. Lexiscan Cardiolite (7/15) with EF 48%, reversible anterior and apical perfusion defect and a fixed basal to  mid inferior defect.  Echo (7/15) with EF 55-60%, mild LAE.  4. H/o melanoma 5. OSA on CPAP 6. AAA: 7 cm, followed by Dr. Scot [name] 7. Atrial fibrillation: Paroxysmal, 1st noted in 7/15.  8. CKD  SH: Retired, married, 3 kids, quit smoking in 1978.  Lives in [place].   FH: Mother with CAD.   ROS: All systems reviewed and negative except as per HPI.   Current Outpatient Prescriptions  Medication Sig Dispense Refill  . amLODipine (NORVASC) 10 MG tablet Take 1 tablet (10 mg total) by mouth daily.  90 tablet  3  . aspirin EC 81 MG tablet Take 1 tablet (81 mg total) by mouth daily.      Marland Kitchen atorvastatin (LIPITOR) 40 MG tablet Take 1 tablet (40 mg total) by mouth daily.  90 tablet  1  . lisinopril (PRINIVIL,ZESTRIL) 20 MG tablet Take 20 mg by mouth daily.      . metoprolol succinate (TOPROL-XL) 100 MG 24 hr tablet Take 1 tablet (100 mg total) by mouth daily. Take with or immediately following a meal.  90 tablet  3   No current facility-administered medications for this visit.    BP 108/65  Pulse 67  Ht 5\' 9"  (1.753 m)  Wt 231 lb (104.781 kg)  BMI 34.10 kg/m2 General: NAD Neck: No JVD, no thyromegaly or thyroid nodule.  Lungs: Clear  to auscultation bilaterally with normal respiratory effort. CV: Nondisplaced PMI.  Heart regular S1/S2, no S3/S4, no murmur.  No edema.  No carotid bruit.  Normal pedal pulses.  Abdomen: Soft, nontender, no hepatosplenomegaly, no distention. Prominent abdominal aorta pulsation. Skin: Intact without lesions or rashes.  Neurologic: Alert and oriented x 3.  Psych: Normal affect. Extremities: No clubbing or cyanosis.   Assessment/Plan 1. CAD: s/p CABG in 1999, last cath in 2009 with patent grafts.  No exertional chest pain or dyspnea (only has chronic fatigue/lethargy).  However, he was set up for [employer] prior to vascular surgery (AAA repair) and was found to have an intermediate risk study with anterior and apical ischemia. - Continue  current regimen of ASA 81, statin, ACEI, and Toprol XL.  - I think that he is going to need a pre-operative cardiac cath given need for future vascular surgery and the intermediate risk stress test.  I will arrange for this next week.  Will use left radial access.  2. HTN: BP is under good control.  3. Hyperlipidemia: Continue atorvastatin.  4. AAA: 7 cm, needs repair.  He is planned for CT abdomen/pelvis tomorrow to assess the AAA and decide on open versus endovascular approach.  Unfortunately, recent BMET (6/15) showed creatinine up to 1.6.  Lisinopril was cut back.  I will recheck BMET today prior to CT.  5. CKD: Creatinine up to 1.6 in 6/15.  Cut back on lisinopril.  Will need repeat BMET prior to CT.  I will then recheck BMET early next week before cardiac cath. Avoid NSAIDs.  6. Atrial fibrillation: Paroxysmal.  He is back in NSR today.  CHADSVASC = 3.  I would like him to go on a NOAC eventually.  However, agree that this should wait until after AAA surgery as this will be soon.   [name] 05/21/2014

## 2014-06-02 ENCOUNTER — Telehealth: Payer: Self-pay | Admitting: Vascular Surgery

## 2014-06-02 NOTE — Telephone Encounter (Signed)
Message copied by Gena Fray on Mon Jun 02, 2014  1:11 PM ------      Message from: Denman George      Created: Mon Jun 02, 2014 11:58 AM      Regarding: cancel office appt. 8/5       This is an [place]- I contacted pt re: cancelling his office appt. on 8/5 w/ CSD.  I am working on scheduling the angiogram; have sent msg to CSD, but wanted to let you know the pt. Is aware his appt. Is cancelled on 8/5. Thanks.  ------

## 2014-06-02 NOTE — Telephone Encounter (Signed)
Message copied by Gena Fray on Mon Jun 02, 2014 10:58 AM ------      Message from: Angelia Mould      Created: Mon Jun 02, 2014  8:59 AM      Regarding: schedule       This patient is scheduled to see me in the office this Wednesday. However, I think this appointment should be canceled because in order to determine if he is a candidate for EVAR, he will require an arteriogram to determine if he has an adequate iliac seal zone. This could be scheduled potentially for 06/09/14. I will not have too much to tell him on Wednesday except that we need to schedule this test so it would probably be best to do the arteriogram before his office visit. Thank you. CD ------

## 2014-06-02 NOTE — Telephone Encounter (Signed)
Spoke with Michaele Offer, RN- she will schedule arteriogram and let patient know that office visit has been canceled as per CSD's request, dpm

## 2014-06-04 ENCOUNTER — Ambulatory Visit: Payer: Commercial Managed Care - HMO | Admitting: Vascular Surgery

## 2014-06-06 ENCOUNTER — Other Ambulatory Visit: Payer: Self-pay

## 2014-06-06 ENCOUNTER — Encounter: Payer: Self-pay | Admitting: *Deleted

## 2014-06-15 MED ORDER — SODIUM CHLORIDE 0.9 % IV SOLN
INTRAVENOUS | Status: DC
  Administered 2014-06-16: 06:00:00 via INTRAVENOUS

## 2014-06-16 ENCOUNTER — Ambulatory Visit (HOSPITAL_COMMUNITY)
Admission: RE | Admit: 2014-06-16 | Discharge: 2014-06-16 | Disposition: A | Discharge status: Home / Self Care | Payer: Medicare PPO | Source: Ambulatory Visit | Attending: Vascular Surgery | Admitting: Vascular Surgery

## 2014-06-16 ENCOUNTER — Encounter (HOSPITAL_COMMUNITY)
Admission: RE | Disposition: A | Discharge status: Home / Self Care | Payer: Self-pay | Source: Ambulatory Visit | Attending: Vascular Surgery

## 2014-06-16 ENCOUNTER — Telehealth: Payer: Self-pay | Admitting: Vascular Surgery

## 2014-06-16 DIAGNOSIS — Z01810 Encounter for preprocedural cardiovascular examination: Secondary | ICD-10-CM

## 2014-06-16 DIAGNOSIS — G4733 Obstructive sleep apnea (adult) (pediatric): Secondary | ICD-10-CM | POA: Diagnosis not present

## 2014-06-16 DIAGNOSIS — I739 Peripheral vascular disease, unspecified: Secondary | ICD-10-CM | POA: Insufficient documentation

## 2014-06-16 DIAGNOSIS — I10 Essential (primary) hypertension: Secondary | ICD-10-CM | POA: Insufficient documentation

## 2014-06-16 DIAGNOSIS — Z7982 Long term (current) use of aspirin: Secondary | ICD-10-CM | POA: Insufficient documentation

## 2014-06-16 DIAGNOSIS — Z951 Presence of aortocoronary bypass graft: Secondary | ICD-10-CM | POA: Diagnosis not present

## 2014-06-16 DIAGNOSIS — Z87891 Personal history of nicotine dependence: Secondary | ICD-10-CM | POA: Diagnosis not present

## 2014-06-16 DIAGNOSIS — I714 Abdominal aortic aneurysm, without rupture, unspecified: Secondary | ICD-10-CM | POA: Insufficient documentation

## 2014-06-16 DIAGNOSIS — E785 Hyperlipidemia, unspecified: Secondary | ICD-10-CM | POA: Diagnosis not present

## 2014-06-16 DIAGNOSIS — I2510 Atherosclerotic heart disease of native coronary artery without angina pectoris: Secondary | ICD-10-CM | POA: Diagnosis not present

## 2014-06-16 HISTORY — PX: ABDOMINAL AORTAGRAM: SHX5454

## 2014-06-16 LAB — POCT I-STAT, CHEM 8
BUN: 20 mg/dL (ref 6–23)
CREATININE: 1.1 mg/dL (ref 0.50–1.35)
Calcium, Ion: 1.24 mmol/L (ref 1.13–1.30)
Chloride: 102 mEq/L (ref 96–112)
Glucose, Bld: 98 mg/dL (ref 70–99)
HCT: 42 % (ref 39.0–52.0)
Hemoglobin: 14.3 g/dL (ref 13.0–17.0)
Potassium: 3.9 mEq/L (ref 3.7–5.3)
SODIUM: 142 meq/L (ref 137–147)
TCO2: 24 mmol/L (ref 0–100)

## 2014-06-16 SURGERY — ABDOMINAL AORTAGRAM
Anesthesia: LOCAL

## 2014-06-16 MED ORDER — LIDOCAINE HCL (PF) 1 % IJ SOLN
INTRAMUSCULAR | Status: AC
  Filled 2014-06-16: qty 30

## 2014-06-16 MED ORDER — SODIUM CHLORIDE 0.9 % IV SOLN: 1.0000 mL/kg/h | INTRAVENOUS | Status: DC

## 2014-06-16 MED ORDER — HEPARIN (PORCINE) IN NACL 2-0.9 UNIT/ML-% IJ SOLN
INTRAMUSCULAR | Status: AC
  Filled 2014-06-16: qty 1000

## 2014-06-16 MED ORDER — HYDRALAZINE HCL 20 MG/ML IJ SOLN
10.0000 mg | INTRAMUSCULAR | Status: DC | PRN
Start: 2014-06-16 — End: 2014-06-16

## 2014-06-16 MED ORDER — FENTANYL CITRATE 0.05 MG/ML IJ SOLN
INTRAMUSCULAR | Status: AC
  Filled 2014-06-16: qty 2

## 2014-06-16 MED ORDER — MIDAZOLAM HCL 2 MG/2ML IJ SOLN
INTRAMUSCULAR | Status: AC
  Filled 2014-06-16: qty 2

## 2014-06-16 NOTE — H&P (View-Only) (Signed)
[facility]  Patient name: Philip [name] MRN: [id number] DOB: [birth year] Sex: male  REASON FOR VISIT: Follow up after CT angiogram  HPI: Philip [name] is a 73 y.o. male who was undergoing a yearly physical exam and had a pulsatile abdominal mass. This prompted an ultrasound which was done on 04/24/2014 which showed a 7 cm infrarenal abdominal aortic aneurysm. He was referred for vascular consultation. He denies any abdominal pain or back pain. He denies any family history of aneurysmal disease. Set him up for a CT angiogram he comes in for follow up after this study.  He does have a history of coronary artery disease and underwent previous CABG in 1999. He denies any history of heart failure. He denies any chest pain or chest pressure. We sent him for preoperative cardiac evaluation in anticipation of elective aneurysm repair. At that visit he was noted to be in atrial fibrillation with a controlled rate. He was not started on anti-coagulation in anticipation of aneurysm repair in the near future. On his most recent visit with Dr. [name] Dubin, he was back in normal sinus rhythm. He underwent a Cardiolite study on 05/14/2014 which was intermediate risk showing a reversible anterior and apical perfusion defect and a fixed basal to mid inferior defect with an ejection fraction of 48%. Echo showed his EF to be 55-60%. Based on his Cardiolite, he scheduled for cardiac catheterization on 05/29/2014.  He denies any abdominal pain or back pain.  Past Medical History  Diagnosis Date  . Hyperlipidemia   . Hypertension   . CAD (coronary artery disease)     had a MI , s/p CABG  . Skin cancer     several , one of them was melanoma (sees derm routinely)  . OSA (obstructive sleep apnea)     started CPAP 12/09  . Myocardial infarction   . Peripheral vascular disease   . AAA (abdominal aortic aneurysm)    Family History  Problem Relation Age of Onset  . Sleep apnea Brother     . Colon cancer Other     aunt  . Prostate cancer Father 56  . Heart disease Mother   . Skin cancer Mother   . Diabetes Neg Hx   . Stroke Neg Hx   . Heart disease Father   . Skin cancer Father   . Skin cancer Brother   . Skin cancer Sister    SOCIAL HISTORY: History  Substance Use Topics  . Smoking status: Former Smoker    Quit date: 09/14/1977  . Smokeless tobacco: Never Used  . Alcohol Use: 3.0 oz/week    2 Glasses of wine, 1 Cans of beer, 2 Shots of liquor per week     Comment: socially   No Known Allergies Current Outpatient Prescriptions  Medication Sig Dispense Refill  . amLODipine (NORVASC) 10 MG tablet Take 1 tablet (10 mg total) by mouth daily.  90 tablet  3  . aspirin EC 81 MG tablet Take 1 tablet (81 mg total) by mouth daily.      Marland Kitchen atorvastatin (LIPITOR) 40 MG tablet Take 1 tablet (40 mg total) by mouth daily.  90 tablet  1  . B Complex-Biotin-FA (B-COMPLEX PO) Take 1 tablet by mouth daily.      . Cholecalciferol (VITAMIN D3) 5000 UNITS CAPS Take 5,000 Units by mouth daily.      Marland Kitchen co-enzyme Q-10 30 MG capsule Take 30 mg by mouth daily.      Marland Kitchen  lisinopril (PRINIVIL,ZESTRIL) 20 MG tablet Take 20 mg by mouth daily.      . magnesium oxide (MAG-OX) 400 MG tablet Take 800 mg by mouth daily.      . metoprolol succinate (TOPROL-XL) 100 MG 24 hr tablet Take 1 tablet (100 mg total) by mouth daily. Take with or immediately following a meal.  90 tablet  3  . Multiple Vitamins-Minerals (MULTIVITAMIN WITH MINERALS) tablet Take 1 tablet by mouth daily.      . Omega-3 Fatty Acids (FISH OIL) 1200 MG CAPS Take 1,200 mg by mouth daily.      Marland Kitchen omeprazole (PRILOSEC OTC) 20 MG tablet Take 20 mg by mouth daily.      . Probiotic Product (PROBIOTIC DAILY) CAPS Take 1 capsule by mouth daily.       No current facility-administered medications for this visit.   REVIEW OF SYSTEMS: Valu.Nieves ] denotes positive finding; [  ] denotes negative finding  CARDIOVASCULAR:  [ ]  chest pain   [ ]  chest  pressure   [ ]  palpitations   [ ]  orthopnea   [ ]  dyspnea on exertion   [ ]  claudication   [ ]  rest pain   [ ]  DVT   [ ]  phlebitis PULMONARY:   [ ]  productive cough   [ ]  asthma   [ ]  wheezing NEUROLOGIC:   [ ]  weakness  [ ]  paresthesias  [ ]  aphasia  [ ]  amaurosis  [ ]  dizziness HEMATOLOGIC:   [ ]  bleeding problems   [ ]  clotting disorders MUSCULOSKELETAL:  [ ]  joint pain   [ ]  joint swelling [ ]  leg swelling GASTROINTESTINAL: [ ]   blood in stool  [ ]   hematemesis GENITOURINARY:  [ ]   dysuria  [ ]   hematuria PSYCHIATRIC:  [ ]  history of major depression INTEGUMENTARY:  [ ]  rashes  [ ]  ulcers CONSTITUTIONAL:  [ ]  fever   [ ]  chills  PHYSICAL EXAM: Filed Vitals:   05/21/14 1605  BP: 129/72  Pulse: 58  Height: 5\' 9"  (1.753 m)  Weight: 233 lb 6.4 oz (105.87 kg)  SpO2: 97%   Constitutional:  He appears well-developed and well-nourished.  Cardiovascular: Normal rate, regular rhythm and normal heart sounds.  Pulses:  Radial pulses are 2+ on the right side, and 2+ on the left side.  Femoral pulses are 2+ on the right side, and 2+ on the left side.  Popliteal pulses are 2+ on the right side, and 2+ on the left side.  Dorsalis pedis pulses are 2+ on the right side, and 0 on the left side.  Posterior tibial pulses are 2+ on the right side, and 2+ on the left side.  Pulmonary/Chest: Breath sounds normal. He has no wheezes. He has no rales.  Abdominal: Soft. Bowel sounds are normal. There is no tenderness.  He has a palpable abdominal aortic aneurysm  Musculoskeletal: Normal range of motion. He exhibits no edema.   Body mass index is 34.45 kg/(m^2).  DATA:  I have reviewed his CT angiogram. The aneurysm measured 6.5 cm in maximum diameter. I have several concerns about the aneurysm and he may not be a candidate for endovascular aneurysm repair. First he has a short neck with reversed taper. Therefore he does not have an ideal landing zone for the proximal graft. Second his common iliac  arteries are very short before they bifurcate into the external iliac and internal iliac arteries. Thus there is not a good seal zone distally either. I  will review the films with Simeon Craft and also with my partners but suspect he will not be a candidate for EVAR.  MEDICAL ISSUES:  AAA (abdominal aortic aneurysm) without rupture This patient has a 6.5 cm infrarenal abdominal aortic aneurysm. His risk of rupture is approximately 15% per year. I would recommend elective repair once his cardiac workup is complete. Based on his CT scan he will likely require open repair however I will review his films with Gore and with my partners. He has a short neck with reversed taper and also a short landing zone distally bilaterally. We will plan on scheduling surgery after his cardiac workup is complete and last he requires cardiac intervention.   [place] [facility] [place]: [phone]

## 2014-06-16 NOTE — Telephone Encounter (Signed)
I left a message for Mr Mossberg to please call me, or Philip [name] at his earliest conv. To schedule this appointment, dpm

## 2014-06-16 NOTE — Progress Notes (Addendum)
Order for sheath removal verified per post procedural orders. Procedure explained to patient and Rt femoral artery access site assessed: level 0, 2+ palpable dorsalis pedis and posterior tibial pulses. 5 [place] Sheath removed and manual pressure applied for 20  minutes. Pre, peri, & post procedural vitals: HR 56, RR 14, O2 Sat 98% RA, BP 139/70, Pain 0. Distal pulses remained intact after sheath removal. Access site level 0 and dressed with 4X4 gauze and tegaderm.  , Melanie RN confirmed condition of site. Post procedural instructions discussed with return demonstration from patient.

## 2014-06-16 NOTE — Telephone Encounter (Signed)
Message copied by Gena Fray on Mon Jun 16, 2014  1:01 PM ------      Message from: Denman George      Created: Mon Jun 16, 2014 11:12 AM      Regarding: needs OV with CSD 8/19 or 8/20                   ----- Message -----         From: Angelia Mould, MD         Sent: 06/16/2014   8:41 AM           To: V[facility] Charge Pool      Subject: charge and office visit                                  PROCEDURE:       1. Ultrasound-guided access the left common femoral artery      2. Aortogram with bilateral iliac arteriogram and bilateral lower extremity runoff            SURGEON: Judeth Cornfield. Scot [name], MD, FACS            He needs to come in Wednesday or Thursday afternoon to discuss scheduling EVAR.  Thank you. CD ------

## 2014-06-16 NOTE — Discharge Instructions (Signed)

## 2014-06-16 NOTE — Interval H&P Note (Signed)
History and Physical Interval Note:  06/16/2014 7:39 AM  Philip [name]  has presented today for surgery, with the diagnosis of AAA  The various methods of treatment have been discussed with the patient and family. After consideration of risks, benefits and other options for treatment, the patient has consented to  Procedure(s): ABDOMINAL AORTAGRAM (N/A) as a surgical intervention .  The patient's history has been reviewed, patient examined, no change in status, stable for surgery.  I have reviewed the patient's chart and labs.  Questions were answered to the patient's satisfaction.     DICKSON,CHRISTOPHER S

## 2014-06-16 NOTE — Op Note (Signed)
   PATIENT: Philip [name]   MRN: [id number] DOB: 1941/06/12    DATE OF PROCEDURE: 06/16/2014  INDICATIONS: [name] is a 73 y.o. male who presents for diagnostic arteriography in anticipation of elective repair of his abdominal aortic aneurysm.  PROCEDURE:  1. Ultrasound-guided access the left common femoral artery 2. Aortogram with bilateral iliac arteriogram and bilateral lower extremity runoff  SURGEON: Judeth Cornfield. Scot [name], MD, FACS  ANESTHESIA: local with sedation   EBL: minimal  TECHNIQUE: The patient was taken to the peripheral vascular lab and received 1 milligram of Versed and 50 mcg of fentanyl. Both groins were prepped and draped in the usual sterile fashion. Under ultrasound guidance, after the skin was anesthetized, the left common femoral artery was cannulated with a micropuncture needle and a micropuncture sheath introduced over the wire. This was exchanged for a 5 [place] sheath over a [employer] wire. The pigtail catheter was positioned at the L1 vertebral body and flush aortogram obtained. I then obtained a magnified view of the neck with 25 of cranial angulation. A lateral projection was also obtained. Next the catheter was positioned above the aortic bifurcation and oblique iliac projections were obtained. Next bilateral lower extremity runoff films were obtained. At the completion of the procedure, the catheter was removed over a wire. The patient was transferred to the holding area for removal of the sheath.  FINDINGS:  1. There are single renal arteries bilaterally with no significant renal artery stenosis identified. 2. There is some reverse taper to the neck although the proximal attachment site appears reasonable. 3. Based on the calcium noted in the wall of the aneurysm the size of the aneurysm appears to be approximately 7 cm. 4. The left common iliac artery is fairly short with some plaque present. 5. Bilateral external iliac arteries, common femoral arteries,  superficial femoral arteries, popliteal arteries, anterior tibial arteries, posterior tibial arteries, and peroneal arteries are patent proximally. There is poor visualization distally.  CLINICAL NOTE: the patient appears to be a possible candidate for EVAR. He will be scheduled for an office visit to discuss his options and then schedule elective repair of his aneurysm.  Deitra Mayo, MD, FACS [facility]  DATE OF DICTATION:   06/16/2014

## 2014-06-18 ENCOUNTER — Encounter: Payer: Self-pay | Admitting: Vascular Surgery

## 2014-06-18 ENCOUNTER — Encounter: Payer: Self-pay | Admitting: Cardiology

## 2014-06-18 ENCOUNTER — Ambulatory Visit (INDEPENDENT_AMBULATORY_CARE_PROVIDER_SITE_OTHER): Payer: Commercial Managed Care - HMO | Admitting: Cardiology

## 2014-06-18 VITALS — BP 130/82 | HR 73 | Ht 69.0 in | Wt 233.0 lb

## 2014-06-18 DIAGNOSIS — E785 Hyperlipidemia, unspecified: Secondary | ICD-10-CM

## 2014-06-18 DIAGNOSIS — I480 Paroxysmal atrial fibrillation: Secondary | ICD-10-CM

## 2014-06-18 DIAGNOSIS — I4891 Unspecified atrial fibrillation: Secondary | ICD-10-CM

## 2014-06-18 DIAGNOSIS — I2510 Atherosclerotic heart disease of native coronary artery without angina pectoris: Secondary | ICD-10-CM

## 2014-06-18 DIAGNOSIS — I714 Abdominal aortic aneurysm, without rupture, unspecified: Secondary | ICD-10-CM

## 2014-06-18 DIAGNOSIS — I10 Essential (primary) hypertension: Secondary | ICD-10-CM

## 2014-06-18 NOTE — Patient Instructions (Signed)
Your physician recommends that you schedule a follow-up appointment with Dr [name] Dubin on Monday October 5,2015 at 2:30PM.

## 2014-06-19 ENCOUNTER — Encounter: Payer: Self-pay | Admitting: Vascular Surgery

## 2014-06-19 ENCOUNTER — Ambulatory Visit (INDEPENDENT_AMBULATORY_CARE_PROVIDER_SITE_OTHER): Payer: Commercial Managed Care - HMO | Admitting: Vascular Surgery

## 2014-06-19 ENCOUNTER — Other Ambulatory Visit: Payer: Self-pay

## 2014-06-19 VITALS — BP 143/81 | HR 60 | Resp 18 | Ht 69.0 in | Wt 235.0 lb

## 2014-06-19 DIAGNOSIS — I714 Abdominal aortic aneurysm, without rupture, unspecified: Secondary | ICD-10-CM

## 2014-06-19 NOTE — Progress Notes (Signed)
Patient ID: Philip [name], male   DOB: [birth year], 73 y.o.   MRN: [id number]  Reason for Consult: AAA   Referred by Colon Branch, MD  Subjective:     HPI:  Philip [name] is a 73 y.o. male who was found to have a pulsatile abdominal mass. This prompted an ultrasound which was done on 04/24/2014 which showed a 7 cm infrarenal abdominal aortic aneurysm. His CT scan showed some reversed taper of the neck of the aneurysm and also short common iliac arteries. Given these issues, he underwent an arteriogram in order to help determine if he is a candidate for EVAR.  He denies any history of abdominal pain or back pain.  The patient has a history of coronary artery disease. He underwent coronary revascularization in 1999. He underwent preoperative cardiac evaluation by Dr. [name]. He had an intermediate risk Lexiscan Cardiolite with anterior and apical ischemia. This reason he underwent heart cath on 05/14/2014 which showed patent graft with no intervention needed. There were no ischemic symptoms. He was cleared for surgery. Dr. [name] Dubin also notes his blood pressure has been under good control.  Past Medical History  Diagnosis Date  . Hyperlipidemia   . Hypertension   . CAD (coronary artery disease)     had a MI , s/p CABG  . Skin cancer     several , one of them was melanoma (sees derm routinely)  . OSA (obstructive sleep apnea)     started CPAP 12/09  . Myocardial infarction   . Peripheral vascular disease   . AAA (abdominal aortic aneurysm)    Family History  Problem Relation Age of Onset  . Sleep apnea Brother   . Colon cancer Other     aunt  . Prostate cancer Father 34  . Heart disease Mother   . Skin cancer Mother   . Diabetes Neg Hx   . Stroke Neg Hx   . Heart disease Father   . Skin cancer Father   . Skin cancer Brother   . Skin cancer Sister   . Heart attack Mother   . Heart attack Father    Past Surgical History  Procedure Laterality Date  . Coronary artery  bypass graft  1999  . Penile prosthesis implant  08/2005  . Rhinoplasty  1975    Short Social History:  History  Substance Use Topics  . Smoking status: Former Smoker    Quit date: 09/14/1977  . Smokeless tobacco: Never Used  . Alcohol Use: 3.0 oz/week    2 Glasses of wine, 1 Cans of beer, 2 Shots of liquor per week     Comment: socially    No Known Allergies  Current Outpatient Prescriptions  Medication Sig Dispense Refill  . amLODipine (NORVASC) 10 MG tablet Take 1 tablet (10 mg total) by mouth daily.  90 tablet  3  . aspirin EC 81 MG tablet Take 1 tablet (81 mg total) by mouth daily.      Marland Kitchen atorvastatin (LIPITOR) 40 MG tablet Take 1 tablet (40 mg total) by mouth daily.  90 tablet  1  . B Complex-Biotin-FA (B-COMPLEX PO) Take 1 tablet by mouth daily.      . Cholecalciferol (VITAMIN D3) 5000 UNITS CAPS Take 5,000 Units by mouth daily.      Marland Kitchen co-enzyme Q-10 30 MG capsule Take 30 mg by mouth daily.      Marland Kitchen lisinopril (PRINIVIL,ZESTRIL) 10 MG tablet Take 5 mg by mouth daily. 1/2  of a 20mg  tablet daily      . magnesium oxide (MAG-OX) 400 MG tablet Take 800 mg by mouth daily.      . metoprolol succinate (TOPROL-XL) 100 MG 24 hr tablet Take 1 tablet (100 mg total) by mouth daily. Take with or immediately following a meal.  90 tablet  3  . Multiple Vitamins-Minerals (MULTIVITAMIN WITH MINERALS) tablet Take 1 tablet by mouth daily.      . Omega-3 Fatty Acids (FISH OIL) 1200 MG CAPS Take 1,200 mg by mouth daily.      Marland Kitchen omeprazole (PRILOSEC OTC) 20 MG tablet Take 20 mg by mouth daily.      . Probiotic Product (PROBIOTIC DAILY) CAPS Take 1 capsule by mouth daily.       No current facility-administered medications for this visit.    Review of Systems  Constitutional: Negative for chills and fever.  Eyes: Negative for loss of vision.  Respiratory: Positive for shortness of breath. Negative for cough and wheezing.  Cardiovascular: Negative for chest pain, chest tightness, claudication,  dyspnea with exertion, orthopnea and palpitations.  GI: Negative for blood in stool and vomiting.  GU: Negative for dysuria and hematuria.  Musculoskeletal: Negative for leg pain, joint pain and myalgias.  Skin: Negative for rash and wound.  Neurological: Negative for dizziness and speech difficulty.  Hematologic: Negative for bruises/bleeds easily. Psychiatric: Negative for depressed mood.        Objective:  Objective  Filed Vitals:   06/19/14 1249  BP: 143/81  Pulse: 60  Resp: 18  Height: 5\' 9"  (1.753 m)  Weight: 235 lb (106.595 kg)   Body mass index is 34.69 kg/(m^2).  Physical Exam  Constitutional: He is oriented to person, place, and time. He appears well-developed and well-nourished.  HENT:  Head: Normocephalic and atraumatic.  Neck: Neck supple. No JVD present. No thyromegaly present.  Cardiovascular: Normal rate, regular rhythm and normal heart sounds.  Exam reveals no friction rub.   No murmur heard. Pulses:      Femoral pulses are 2+ on the right side, and 2+ on the left side.      Popliteal pulses are 2+ on the right side, and 2+ on the left side.       Dorsalis pedis pulses are 2+ on the right side, and 2+ on the left side.       Posterior tibial pulses are 2+ on the right side, and 2+ on the left side.  Pulmonary/Chest: Breath sounds normal. He has no wheezes. He has no rales.  Abdominal: Soft. Bowel sounds are normal. There is no tenderness.  His aneurysm is palpable and nontender.  Musculoskeletal: Normal range of motion. He exhibits no edema.  Lymphadenopathy:    He has no cervical adenopathy.  Neurological: He is alert and oriented to person, place, and time. He has normal strength. No sensory deficit.  Skin: No lesion and no rash noted.  Psychiatric: He has a normal mood and affect.    Data: His creatinine is 1.1 on 06/16/2014.   His angiogram shows some reversed taper in his neck. The left common iliac is short and does have some atherosclerotic  disease. There is a stenosis in the proximal left hypogastric artery.     Assessment/Plan:    AAA (abdominal aortic aneurysm) without rupture This patient has a 7 cm infrarenal abdominal aortic aneurysm. I have explained that the risk of rupture for aneurysm of this size is 15-20% per year. We have discussed the option  of endovascular versus open repair. I have explained that I have a couple of concerns about EVAR. First, he does have some reversed taper in the proximal neck. In addition, the left common iliac artery especially is very short and it is possible that we would have to extend down into the external iliac artery with the small risk of compromising flow to the pelvis or colon. I've also reviewed the potential long-term risks of EVAR including the risk of continued aneurysm expansion and type II endoleak. We also discussed open repair and the potential complications associated with this including a 4-5 percent risk of mortality or major morbidity. He also understands that if we were to perform EVAR there is a chance that he would require conversion to open repair. He would like to proceed with the EVAR. We should be able to schedule this in the near future. Of note he has undergone preoperative cardiac clearance as described above.   Angelia Mould MD [facility]

## 2014-06-19 NOTE — Progress Notes (Signed)
Patient ID: Philip [name], male   DOB: February 23, 1941, 73 y.o.   MRN: [id number] PCP: Dr. Larose Kells  73 yo with history of CAD s/p CABG in 1999 presents for cardiology followup.  Recently, a number of issues have developed.  He noted a prominent abdominal pulsation when using his inversion table and told his PCP about this.  Therefore, he had an abdominal ultrasound with a 7 cm AAA noted. He was seen by [name] for discussion of repair and then referred back to this office for pre-operative evaluation.  He was seen by the PA in this office, and at that appointment was noted to be in atrial fibrillation with controlled rate.  He had not noticed any palpitations or change in his baseline symptoms. He was not started on anticoagulation given plan for AAA surgery in the near future.  At subsequent appointments (including today), he has been in NSR.  He was sent for a pre-op Lexiscan Cardiolite in 7/15. This study was intermediate risk, showing a reversible anterior and apical perfusion defect and a fixed basal to mid inferior defect with EF 48%.  Echo was done to reassess EF.  This showed EF 55-60%.  I did LHC in 7/15 given abnormal Cardiolite.  This showed patent grafts.    Currently, he is doing well.  No exertional dyspnea or chest pain.  He had a peripheral angiogram next week and is awaiting decision on EVAR versus open aneurysm repair.   ECG (05/07/14): Atrial fibrillation with PVCs ECG (05/20/14): NSR, RAE ECG (06/18/14): NSR, RAE, IVCD  Labs (11/13): K 4.5, creatinine 1.1, LDL 74, HDL 36 Labs (8/14): K 4.4, creatinine 1.2, LDL 53, HDL 36 Labs (1/15): LDL 54, HDL 37 Labs (6/15): K 4.6, creatinine 1.6 Labs (8/15): K 3.9, creatinine 1.1  PMH: 1. Hyperlipidemia 2. HTN 3. CAD s/p CABG 1999 with LIMA-LAD, SVG-LCx, SVG-RCA.  [place] 2009 with 3/3 grafts patent. Lexiscan Cardiolite (7/15) with EF 48%, reversible anterior and apical perfusion defect and a fixed basal to mid inferior defect.  Echo (7/15) with EF  55-60%, mild LAE.  LHC (7/15) with total occlusion of LM and RCA; SVG-OM patent, SVG-RCA patent, LIMA-LAD patent.  4. H/o melanoma 5. OSA on CPAP 6. AAA: 7 cm, followed by Dr. Scot [name] 7. Atrial fibrillation: Paroxysmal, 1st noted in 7/15.  8. CKD  SH: Retired, married, 3 kids, quit smoking in 1978.  Lives in [place].   FH: Mother with CAD.   ROS: All systems reviewed and negative except as per HPI.   Current Outpatient Prescriptions  Medication Sig Dispense Refill  . amLODipine (NORVASC) 10 MG tablet Take 1 tablet (10 mg total) by mouth daily.  90 tablet  3  . aspirin EC 81 MG tablet Take 1 tablet (81 mg total) by mouth daily.      Marland Kitchen atorvastatin (LIPITOR) 40 MG tablet Take 1 tablet (40 mg total) by mouth daily.  90 tablet  1  . B Complex-Biotin-FA (B-COMPLEX PO) Take 1 tablet by mouth daily.      . Cholecalciferol (VITAMIN D3) 5000 UNITS CAPS Take 5,000 Units by mouth daily.      Marland Kitchen co-enzyme Q-10 30 MG capsule Take 30 mg by mouth daily.      Marland Kitchen lisinopril (PRINIVIL,ZESTRIL) 10 MG tablet Take 5 mg by mouth daily. 1/2 of a 20mg  tablet daily      . magnesium oxide (MAG-OX) 400 MG tablet Take 800 mg by mouth daily.      . metoprolol  succinate (TOPROL-XL) 100 MG 24 hr tablet Take 1 tablet (100 mg total) by mouth daily. Take with or immediately following a meal.  90 tablet  3  . Multiple Vitamins-Minerals (MULTIVITAMIN WITH MINERALS) tablet Take 1 tablet by mouth daily.      . Omega-3 Fatty Acids (FISH OIL) 1200 MG CAPS Take 1,200 mg by mouth daily.      Marland Kitchen omeprazole (PRILOSEC OTC) 20 MG tablet Take 20 mg by mouth daily.      . Probiotic Product (PROBIOTIC DAILY) CAPS Take 1 capsule by mouth daily.       No current facility-administered medications for this visit.    BP 130/82  Pulse 73  Ht 5\' 9"  (1.753 m)  Wt 105.688 kg (233 lb)  BMI 34.39 kg/m2 General: NAD Neck: No JVD, no thyromegaly or thyroid nodule.  Lungs: Clear to auscultation bilaterally with normal respiratory  effort. CV: Nondisplaced PMI.  Heart regular S1/S2, no S3/S4, no murmur.  No edema.  No carotid bruit.  Normal pedal pulses.  Abdomen: Soft, nontender, no hepatosplenomegaly, no distention. Prominent abdominal aorta pulsation. Skin: Intact without lesions or rashes.  Neurologic: Alert and oriented x 3.  Psych: Normal affect. Extremities: No clubbing or cyanosis.   Assessment/Plan 1. CAD: s/p CABG in 1999. He was set up for [employer] prior to vascular surgery (AAA repair) and was found to have an intermediate risk study with anterior and apical ischemia.  LHC in 7/15 showed patent grafts, no intervention needed.  He has no ischemic symptoms.  - Continue current regimen of ASA 81, statin, ACEI, and Toprol XL.  2. HTN: BP is under good control.  3. Hyperlipidemia: Continue atorvastatin, check lipids next appointment.  4. AAA: 7 cm, needs repair.  Plan for EVAR versus open surgery in the near future.   5. CKD: Improved creatinine recently. 6. Atrial fibrillation: Paroxysmal.  He is in NSR today.  CHADSVASC = 3.  I would like him to go on a NOAC eventually.  However, agree that this should wait until after AAA surgery as this will be soon.   I will see him back 2-3 weeks after surgery to discuss starting anticoagulation for PAF.   [name] 06/19/2014

## 2014-06-19 NOTE — Assessment & Plan Note (Signed)
This patient has a 7 cm infrarenal abdominal aortic aneurysm. I have explained that the risk of rupture for aneurysm of this size is 15-20% per year. We have discussed the option of endovascular versus open repair. I have explained that I have a couple of concerns about EVAR. First, he does have some reversed taper in the proximal neck. In addition, the left common iliac artery especially is very short and it is possible that we would have to extend down into the external iliac artery with the small risk of compromising flow to the pelvis or colon. I've also reviewed the potential long-term risks of EVAR including the risk of continued aneurysm expansion and type II endoleak. We also discussed open repair and the potential complications associated with this including a 4-5 percent risk of mortality or major morbidity. He also understands that if we were to perform EVAR there is a chance that he would require conversion to open repair. He would like to proceed with the EVAR. We should be able to schedule this in the near future. Of note he has undergone preoperative cardiac clearance as described above.

## 2014-06-27 ENCOUNTER — Encounter (HOSPITAL_COMMUNITY): Payer: Self-pay | Admitting: Pharmacy Technician

## 2014-06-27 ENCOUNTER — Ambulatory Visit: Payer: Medicare PPO | Admitting: Cardiology

## 2014-07-01 NOTE — Pre-Procedure Instructions (Signed)
Philip [name]  07/01/2014   Your procedure is scheduled on:  Tues, Sept 8 @ 7:30 AM  Report to [facility]  at 5:30 AM.  Call this number if you have problems the morning of surgery: [phone]   Remember:   Do not eat food or drink liquids after midnight.   Take these medicines the morning of surgery with A SIP OF WATER: Amlodipine(Norvasc),Metoprolol(Toprol),and Omeprazole(Prilosec)               Stop taking your Fish Oil,Vitamins,and CO Q10.No Goody's,BC's,Aleve,Ibuprofen,or any Herbal Medications   Do not wear jewelry  Do not wear lotions, powders, or colognes. You may wear deodorant.  Men may shave face and neck.  Do not bring valuables to the hospital.  [facility] is not responsible                  for any belongings or valuables.               Contacts, dentures or bridgework may not be worn into surgery.  Leave suitcase in the car. After surgery it may be brought to your room.  For patients admitted to the hospital, discharge time is determined by your                treatment team.                Special Instructions:  [place] - Preparing for Surgery  Before surgery, you can play an important role.  Because skin is not sterile, your skin needs to be as free of germs as possible.  You can reduce the number of germs on you skin by washing with CHG (chlorahexidine gluconate) soap before surgery.  CHG is an antiseptic cleaner which kills germs and bonds with the skin to continue killing germs even after washing.  Please DO NOT use if you have an allergy to CHG or antibacterial soaps.  If your skin becomes reddened/irritated stop using the CHG and inform your nurse when you arrive at Short Stay.  Do not shave (including legs and underarms) for at least 48 hours prior to the first CHG shower.  You may shave your face.  Please follow these instructions carefully:   1.  Shower with CHG Soap the night before surgery and the                                morning of  Surgery.  2.  If you choose to wash your hair, wash your hair first as usual with your       normal shampoo.  3.  After you shampoo, rinse your hair and body thoroughly to remove the                      Shampoo.  4.  Use CHG as you would any other liquid soap.  You can apply chg directly       to the skin and wash gently with scrungie or a clean washcloth.  5.  Apply the CHG Soap to your body ONLY FROM THE NECK DOWN.        Do not use on open wounds or open sores.  Avoid contact with your eyes,       ears, mouth and genitals (private parts).  Wash genitals (private parts)       with your normal soap.  6.  Wash thoroughly, paying special attention to the area where your surgery        will be performed.  7.  Thoroughly rinse your body with warm water from the neck down.  8.  DO NOT shower/wash with your normal soap after using and rinsing off       the CHG Soap.  9.  Pat yourself dry with a clean towel.            10.  Wear clean pajamas.            11.  Place clean sheets on your bed the night of your first shower and do not        sleep with pets.  Day of Surgery  Do not apply any lotions/deoderants the morning of surgery.  Please wear clean clothes to the hospital/surgery center.     Please read over the following fact sheets that you were given: Pain Booklet, Coughing and Deep Breathing, Blood Transfusion Information, MRSA Information and Surgical Site Infection Prevention

## 2014-07-02 ENCOUNTER — Encounter (HOSPITAL_COMMUNITY)
Admission: RE | Admit: 2014-07-02 | Discharge: 2014-07-02 | Disposition: A | Discharge status: Home / Self Care | Payer: Medicare PPO | Source: Ambulatory Visit | Attending: Vascular Surgery | Admitting: Vascular Surgery

## 2014-07-02 ENCOUNTER — Encounter (HOSPITAL_COMMUNITY): Payer: Self-pay

## 2014-07-02 ENCOUNTER — Encounter (HOSPITAL_COMMUNITY)
Admission: RE | Admit: 2014-07-02 | Discharge: 2014-07-02 | Disposition: A | Discharge status: Home / Self Care | Payer: Medicare PPO | Source: Ambulatory Visit | Attending: Anesthesiology | Admitting: Anesthesiology

## 2014-07-02 DIAGNOSIS — I714 Abdominal aortic aneurysm, without rupture, unspecified: Secondary | ICD-10-CM | POA: Insufficient documentation

## 2014-07-02 DIAGNOSIS — Z01818 Encounter for other preprocedural examination: Secondary | ICD-10-CM | POA: Insufficient documentation

## 2014-07-02 HISTORY — DX: Personal history of colonic polyps: Z86.010

## 2014-07-02 HISTORY — DX: Personal history of other infectious and parasitic diseases: Z86.19

## 2014-07-02 HISTORY — DX: Cardiac arrhythmia, unspecified: I49.9

## 2014-07-02 HISTORY — DX: Effusion, unspecified joint: M25.40

## 2014-07-02 HISTORY — DX: Pneumonia, unspecified organism: J18.9

## 2014-07-02 HISTORY — DX: Paresthesia of skin: R20.2

## 2014-07-02 HISTORY — DX: Unspecified cataract: H26.9

## 2014-07-02 HISTORY — DX: Gastro-esophageal reflux disease without esophagitis: K21.9

## 2014-07-02 HISTORY — DX: Personal history of urinary calculi: Z87.442

## 2014-07-02 HISTORY — DX: Pain in unspecified joint: M25.50

## 2014-07-02 HISTORY — DX: Personal history of colon polyps, unspecified: Z86.0100

## 2014-07-02 HISTORY — DX: Tinnitus, unspecified ear: H93.19

## 2014-07-02 LAB — CBC
HCT: 42.4 % (ref 39.0–52.0)
Hemoglobin: 14 g/dL (ref 13.0–17.0)
MCH: 29.7 pg (ref 26.0–34.0)
MCHC: 33 g/dL (ref 30.0–36.0)
MCV: 90 fL (ref 78.0–100.0)
PLATELETS: 148 10*3/uL — AB (ref 150–400)
RBC: 4.71 MIL/uL (ref 4.22–5.81)
RDW: 13.9 % (ref 11.5–15.5)
WBC: 4.8 10*3/uL (ref 4.0–10.5)

## 2014-07-02 LAB — URINALYSIS, ROUTINE W REFLEX MICROSCOPIC
Bilirubin Urine: NEGATIVE
Glucose, UA: NEGATIVE mg/dL
Hgb urine dipstick: NEGATIVE
KETONES UR: NEGATIVE mg/dL
LEUKOCYTES UA: NEGATIVE
NITRITE: NEGATIVE
PH: 6.5 (ref 5.0–8.0)
PROTEIN: NEGATIVE mg/dL
Specific Gravity, Urine: 1.005 (ref 1.005–1.030)
Urobilinogen, UA: 0.2 mg/dL (ref 0.0–1.0)

## 2014-07-02 LAB — COMPREHENSIVE METABOLIC PANEL
ALK PHOS: 67 U/L (ref 39–117)
ALT: 22 U/L (ref 0–53)
AST: 25 U/L (ref 0–37)
Albumin: 4 g/dL (ref 3.5–5.2)
Anion gap: 12 (ref 5–15)
BUN: 22 mg/dL (ref 6–23)
CALCIUM: 9.2 mg/dL (ref 8.4–10.5)
CO2: 25 meq/L (ref 19–32)
Chloride: 105 mEq/L (ref 96–112)
Creatinine, Ser: 1.07 mg/dL (ref 0.50–1.35)
GFR calc Af Amer: 77 mL/min — ABNORMAL LOW (ref >90)
GFR, EST NON AFRICAN AMERICAN: 66 mL/min — AB (ref >90)
Glucose, Bld: 101 mg/dL — ABNORMAL HIGH (ref 70–99)
POTASSIUM: 4.5 meq/L (ref 3.7–5.3)
SODIUM: 142 meq/L (ref 137–147)
Total Bilirubin: 0.5 mg/dL (ref 0.3–1.2)
Total Protein: 6.9 g/dL (ref 6.0–8.3)

## 2014-07-02 LAB — SURGICAL PCR SCREEN
MRSA, PCR: NEGATIVE
STAPHYLOCOCCUS AUREUS: POSITIVE — AB

## 2014-07-02 LAB — TYPE AND SCREEN
ABO/RH(D): A POS
Antibody Screen: NEGATIVE

## 2014-07-02 LAB — PROTIME-INR
INR: 1.05 (ref 0.00–1.49)
Prothrombin Time: 13.7 seconds (ref 11.6–15.2)

## 2014-07-02 LAB — APTT: APTT: 30 s (ref 24–37)

## 2014-07-02 LAB — ABO/RH: ABO/RH(D): A POS

## 2014-07-02 MED ORDER — CHLORHEXIDINE GLUCONATE CLOTH 2 % EX PADS: 6.0000 | MEDICATED_PAD | Freq: Once | CUTANEOUS | Status: DC

## 2014-07-02 NOTE — Progress Notes (Signed)
mupriocin called into the [facility] on [address]

## 2014-07-02 NOTE — Progress Notes (Addendum)
Cardiac clearance in epic from 06-19-14-Dr.McLean  Echo/stress test/heart cath in epic from 2015  EKG in epic from 06-18-14  Sleep study in epic from 2009  Denies CXR in past yr  Medical Md is Dr.Jose Larose Kells

## 2014-07-04 NOTE — Progress Notes (Signed)
Pt notified of time change;to arrive at 0930-verbalized understanding

## 2014-07-07 MED ORDER — DEXTROSE 5 % IV SOLN
1.5000 g | INTRAVENOUS | Status: AC
  Administered 2014-07-08: 1.5 g via INTRAVENOUS
  Filled 2014-07-07: qty 1.5

## 2014-07-07 MED ORDER — SODIUM CHLORIDE 0.9 % IV SOLN: INTRAVENOUS | Status: DC

## 2014-07-08 ENCOUNTER — Inpatient Hospital Stay (HOSPITAL_COMMUNITY)
Admission: RE | Admit: 2014-07-08 | Discharge: 2014-07-09 | DRG: 238 | Disposition: A | Discharge status: Home / Self Care | Payer: Medicare PPO | Source: Ambulatory Visit | Attending: Vascular Surgery | Admitting: Vascular Surgery

## 2014-07-08 ENCOUNTER — Inpatient Hospital Stay (HOSPITAL_COMMUNITY): Payer: Medicare PPO | Admitting: Anesthesiology

## 2014-07-08 ENCOUNTER — Encounter (HOSPITAL_COMMUNITY): Payer: Medicare PPO | Admitting: Anesthesiology

## 2014-07-08 ENCOUNTER — Encounter (HOSPITAL_COMMUNITY)
Admission: RE | Disposition: A | Discharge status: Home / Self Care | Payer: Self-pay | Source: Ambulatory Visit | Attending: Vascular Surgery

## 2014-07-08 ENCOUNTER — Inpatient Hospital Stay (HOSPITAL_COMMUNITY): Payer: Medicare PPO

## 2014-07-08 ENCOUNTER — Encounter (HOSPITAL_COMMUNITY): Payer: Self-pay | Admitting: Surgery

## 2014-07-08 DIAGNOSIS — Z808 Family history of malignant neoplasm of other organs or systems: Secondary | ICD-10-CM | POA: Diagnosis not present

## 2014-07-08 DIAGNOSIS — I10 Essential (primary) hypertension: Secondary | ICD-10-CM | POA: Diagnosis present

## 2014-07-08 DIAGNOSIS — E785 Hyperlipidemia, unspecified: Secondary | ICD-10-CM | POA: Diagnosis present

## 2014-07-08 DIAGNOSIS — Z800 Family history of malignant neoplasm of digestive organs: Secondary | ICD-10-CM | POA: Diagnosis not present

## 2014-07-08 DIAGNOSIS — Z85828 Personal history of other malignant neoplasm of skin: Secondary | ICD-10-CM | POA: Diagnosis not present

## 2014-07-08 DIAGNOSIS — I714 Abdominal aortic aneurysm, without rupture, unspecified: Principal | ICD-10-CM | POA: Diagnosis present

## 2014-07-08 DIAGNOSIS — I252 Old myocardial infarction: Secondary | ICD-10-CM | POA: Diagnosis not present

## 2014-07-08 DIAGNOSIS — I2510 Atherosclerotic heart disease of native coronary artery without angina pectoris: Secondary | ICD-10-CM | POA: Diagnosis present

## 2014-07-08 DIAGNOSIS — Z7982 Long term (current) use of aspirin: Secondary | ICD-10-CM

## 2014-07-08 DIAGNOSIS — Z8042 Family history of malignant neoplasm of prostate: Secondary | ICD-10-CM

## 2014-07-08 DIAGNOSIS — G4733 Obstructive sleep apnea (adult) (pediatric): Secondary | ICD-10-CM | POA: Diagnosis present

## 2014-07-08 DIAGNOSIS — Z8249 Family history of ischemic heart disease and other diseases of the circulatory system: Secondary | ICD-10-CM | POA: Diagnosis not present

## 2014-07-08 DIAGNOSIS — I739 Peripheral vascular disease, unspecified: Secondary | ICD-10-CM | POA: Diagnosis present

## 2014-07-08 DIAGNOSIS — Z87891 Personal history of nicotine dependence: Secondary | ICD-10-CM

## 2014-07-08 DIAGNOSIS — Z951 Presence of aortocoronary bypass graft: Secondary | ICD-10-CM

## 2014-07-08 HISTORY — PX: ABDOMINAL AORTIC ENDOVASCULAR STENT GRAFT: SHX5707

## 2014-07-08 LAB — BASIC METABOLIC PANEL
Anion gap: 11 (ref 5–15)
BUN: 19 mg/dL (ref 6–23)
CO2: 23 mEq/L (ref 19–32)
Calcium: 8.4 mg/dL (ref 8.4–10.5)
Chloride: 104 mEq/L (ref 96–112)
Creatinine, Ser: 0.91 mg/dL (ref 0.50–1.35)
GFR calc Af Amer: >90 mL/min (ref >90)
GFR, EST NON AFRICAN AMERICAN: 81 mL/min — AB (ref >90)
Glucose, Bld: 96 mg/dL (ref 70–99)
Potassium: 4.6 mEq/L (ref 3.7–5.3)
SODIUM: 138 meq/L (ref 137–147)

## 2014-07-08 LAB — CBC
HCT: 37.5 % — ABNORMAL LOW (ref 39.0–52.0)
Hemoglobin: 12.8 g/dL — ABNORMAL LOW (ref 13.0–17.0)
MCH: 30 pg (ref 26.0–34.0)
MCHC: 34.1 g/dL (ref 30.0–36.0)
MCV: 88 fL (ref 78.0–100.0)
Platelets: 130 10*3/uL — ABNORMAL LOW (ref 150–400)
RBC: 4.26 MIL/uL (ref 4.22–5.81)
RDW: 13.9 % (ref 11.5–15.5)
WBC: 7.2 10*3/uL (ref 4.0–10.5)

## 2014-07-08 LAB — APTT: aPTT: 33 seconds (ref 24–37)

## 2014-07-08 LAB — MAGNESIUM: Magnesium: 1.8 mg/dL (ref 1.5–2.5)

## 2014-07-08 LAB — PROTIME-INR
INR: 1.18 (ref 0.00–1.49)
PROTHROMBIN TIME: 15 s (ref 11.6–15.2)

## 2014-07-08 SURGERY — INSERTION, ENDOVASCULAR STENT GRAFT, AORTA, ABDOMINAL
Anesthesia: General | Site: Abdomen

## 2014-07-08 MED ORDER — PANTOPRAZOLE SODIUM 20 MG PO TBEC
20.0000 mg | DELAYED_RELEASE_TABLET | Freq: Every day | ORAL | Status: DC
  Administered 2014-07-08 – 2014-07-09 (×2): 20 mg via ORAL
  Filled 2014-07-08 (×2): qty 1

## 2014-07-08 MED ORDER — ARTIFICIAL TEARS OP OINT
TOPICAL_OINTMENT | OPHTHALMIC | Status: DC | PRN
  Administered 2014-07-08: 1 via OPHTHALMIC

## 2014-07-08 MED ORDER — OMEPRAZOLE MAGNESIUM 20 MG PO TBEC: 20.0000 mg | DELAYED_RELEASE_TABLET | Freq: Every day | ORAL | Status: DC

## 2014-07-08 MED ORDER — ONDANSETRON HCL 4 MG/2ML IJ SOLN
INTRAMUSCULAR | Status: AC
  Filled 2014-07-08: qty 2

## 2014-07-08 MED ORDER — ARTIFICIAL TEARS OP OINT
TOPICAL_OINTMENT | OPHTHALMIC | Status: AC
  Filled 2014-07-08: qty 3.5

## 2014-07-08 MED ORDER — SODIUM CHLORIDE 0.9 % IV SOLN
10.0000 mg | INTRAVENOUS | Status: DC | PRN
  Administered 2014-07-08: 10 ug/min via INTRAVENOUS

## 2014-07-08 MED ORDER — FISH OIL 1200 MG PO CAPS: 1200.0000 mg | ORAL_CAPSULE | Freq: Every day | ORAL | Status: DC

## 2014-07-08 MED ORDER — SUCCINYLCHOLINE CHLORIDE 20 MG/ML IJ SOLN
INTRAMUSCULAR | Status: AC
  Filled 2014-07-08: qty 1

## 2014-07-08 MED ORDER — PROTAMINE SULFATE 10 MG/ML IV SOLN
INTRAVENOUS | Status: DC | PRN
  Administered 2014-07-08 (×2): 10 mg via INTRAVENOUS
  Administered 2014-07-08: 20 mg via INTRAVENOUS

## 2014-07-08 MED ORDER — HEPARIN SODIUM (PORCINE) 1000 UNIT/ML IJ SOLN
INTRAMUSCULAR | Status: AC
  Filled 2014-07-08: qty 1

## 2014-07-08 MED ORDER — FENTANYL CITRATE 0.05 MG/ML IJ SOLN
INTRAMUSCULAR | Status: AC
  Administered 2014-07-08: 100 ug via INTRAVENOUS
  Filled 2014-07-08: qty 2

## 2014-07-08 MED ORDER — DEXAMETHASONE SODIUM PHOSPHATE 4 MG/ML IJ SOLN
INTRAMUSCULAR | Status: DC | PRN
  Administered 2014-07-08: 8 mg via INTRAVENOUS

## 2014-07-08 MED ORDER — ACETAMINOPHEN 650 MG RE SUPP: 325.0000 mg | RECTAL | Status: DC | PRN

## 2014-07-08 MED ORDER — PHENOL 1.4 % MT LIQD: 1.0000 | OROMUCOSAL | Status: DC | PRN

## 2014-07-08 MED ORDER — SODIUM CHLORIDE 0.9 % IR SOLN
Status: DC | PRN
  Administered 2014-07-08: 16:00:00

## 2014-07-08 MED ORDER — MAGNESIUM SULFATE 40 MG/ML IJ SOLN: 2.0000 g | Freq: Every day | INTRAMUSCULAR | Status: DC | PRN

## 2014-07-08 MED ORDER — FENTANYL CITRATE 0.05 MG/ML IJ SOLN
INTRAMUSCULAR | Status: DC | PRN
  Administered 2014-07-08: 50 ug via INTRAVENOUS
  Administered 2014-07-08: 150 ug via INTRAVENOUS
  Administered 2014-07-08 (×2): 50 ug via INTRAVENOUS

## 2014-07-08 MED ORDER — GLYCOPYRROLATE 0.2 MG/ML IJ SOLN
INTRAMUSCULAR | Status: AC
  Filled 2014-07-08: qty 5

## 2014-07-08 MED ORDER — EPHEDRINE SULFATE 50 MG/ML IJ SOLN
INTRAMUSCULAR | Status: DC | PRN
  Administered 2014-07-08: 10 mg via INTRAVENOUS

## 2014-07-08 MED ORDER — ENOXAPARIN SODIUM 40 MG/0.4ML ~~LOC~~ SOLN
40.0000 mg | SUBCUTANEOUS | Status: DC
  Administered 2014-07-08: 40 mg via SUBCUTANEOUS
  Filled 2014-07-08 (×2): qty 0.4

## 2014-07-08 MED ORDER — FENTANYL CITRATE 0.05 MG/ML IJ SOLN
INTRAMUSCULAR | Status: AC
  Filled 2014-07-08: qty 5

## 2014-07-08 MED ORDER — MORPHINE SULFATE 2 MG/ML IJ SOLN: 2.0000 mg | INTRAMUSCULAR | Status: DC | PRN

## 2014-07-08 MED ORDER — ATORVASTATIN CALCIUM 40 MG PO TABS
40.0000 mg | ORAL_TABLET | Freq: Every day | ORAL | Status: DC
  Administered 2014-07-08 – 2014-07-09 (×2): 40 mg via ORAL
  Filled 2014-07-08 (×2): qty 1

## 2014-07-08 MED ORDER — GLYCOPYRROLATE 0.2 MG/ML IJ SOLN
INTRAMUSCULAR | Status: DC | PRN
  Administered 2014-07-08: .6 mg via INTRAVENOUS
  Administered 2014-07-08 (×2): 0.2 mg via INTRAVENOUS

## 2014-07-08 MED ORDER — NEOSTIGMINE METHYLSULFATE 10 MG/10ML IV SOLN
INTRAVENOUS | Status: AC
  Filled 2014-07-08: qty 1

## 2014-07-08 MED ORDER — ROCURONIUM BROMIDE 100 MG/10ML IV SOLN
INTRAVENOUS | Status: DC | PRN
  Administered 2014-07-08: 50 mg via INTRAVENOUS
  Administered 2014-07-08: 10 mg via INTRAVENOUS

## 2014-07-08 MED ORDER — ONDANSETRON HCL 4 MG/2ML IJ SOLN: 4.0000 mg | Freq: Four times a day (QID) | INTRAMUSCULAR | Status: DC | PRN

## 2014-07-08 MED ORDER — IODIXANOL 320 MG/ML IV SOLN
INTRAVENOUS | Status: DC | PRN
  Administered 2014-07-08: 150 mL via INTRA_ARTERIAL

## 2014-07-08 MED ORDER — MIDAZOLAM HCL 2 MG/2ML IJ SOLN
INTRAMUSCULAR | Status: AC
  Filled 2014-07-08: qty 2

## 2014-07-08 MED ORDER — POTASSIUM CHLORIDE CRYS ER 20 MEQ PO TBCR: 20.0000 meq | EXTENDED_RELEASE_TABLET | Freq: Every day | ORAL | Status: DC | PRN

## 2014-07-08 MED ORDER — EPHEDRINE SULFATE 50 MG/ML IJ SOLN
INTRAMUSCULAR | Status: AC
  Filled 2014-07-08: qty 1

## 2014-07-08 MED ORDER — OMEGA-3-ACID ETHYL ESTERS 1 G PO CAPS
1.0000 g | ORAL_CAPSULE | Freq: Every day | ORAL | Status: DC
  Administered 2014-07-09: 1 g via ORAL
  Filled 2014-07-08: qty 1

## 2014-07-08 MED ORDER — DEXTROSE 5 % IV SOLN
1.5000 g | Freq: Two times a day (BID) | INTRAVENOUS | Status: AC
  Administered 2014-07-08 – 2014-07-09 (×2): 1.5 g via INTRAVENOUS
  Filled 2014-07-08 (×3): qty 1.5

## 2014-07-08 MED ORDER — LACTATED RINGERS IV SOLN
INTRAVENOUS | Status: DC
  Administered 2014-07-08: 10:00:00 via INTRAVENOUS

## 2014-07-08 MED ORDER — ONDANSETRON HCL 4 MG/2ML IJ SOLN
INTRAMUSCULAR | Status: DC | PRN
  Administered 2014-07-08: 4 mg via INTRAVENOUS

## 2014-07-08 MED ORDER — LIDOCAINE HCL (CARDIAC) 20 MG/ML IV SOLN
INTRAVENOUS | Status: DC | PRN
  Administered 2014-07-08: 50 mg via INTRAVENOUS
  Administered 2014-07-08: 100 mg via INTRAVENOUS

## 2014-07-08 MED ORDER — ROCURONIUM BROMIDE 50 MG/5ML IV SOLN
INTRAVENOUS | Status: AC
  Filled 2014-07-08: qty 1

## 2014-07-08 MED ORDER — METOPROLOL TARTRATE 1 MG/ML IV SOLN: 2.0000 mg | INTRAVENOUS | Status: DC | PRN

## 2014-07-08 MED ORDER — HYDRALAZINE HCL 20 MG/ML IJ SOLN: 10.0000 mg | INTRAMUSCULAR | Status: DC | PRN

## 2014-07-08 MED ORDER — LACTATED RINGERS IV SOLN
INTRAVENOUS | Status: DC | PRN
  Administered 2014-07-08: 15:00:00 via INTRAVENOUS

## 2014-07-08 MED ORDER — GUAIFENESIN-DM 100-10 MG/5ML PO SYRP
15.0000 mL | ORAL_SOLUTION | ORAL | Status: DC | PRN
Start: 2014-07-08 — End: 2014-07-09

## 2014-07-08 MED ORDER — 0.9 % SODIUM CHLORIDE (POUR BTL) OPTIME
TOPICAL | Status: DC | PRN
Start: 2014-07-08 — End: 2014-07-08
  Administered 2014-07-08: 1000 mL

## 2014-07-08 MED ORDER — GLYCOPYRROLATE 0.2 MG/ML IJ SOLN
INTRAMUSCULAR | Status: AC
  Filled 2014-07-08: qty 2

## 2014-07-08 MED ORDER — OXYCODONE HCL 5 MG PO TABS: 5.0000 mg | ORAL_TABLET | Freq: Once | ORAL | Status: DC | PRN

## 2014-07-08 MED ORDER — ASPIRIN EC 81 MG PO TBEC
81.0000 mg | DELAYED_RELEASE_TABLET | Freq: Every day | ORAL | Status: DC
  Administered 2014-07-08 – 2014-07-09 (×2): 81 mg via ORAL
  Filled 2014-07-08 (×2): qty 1

## 2014-07-08 MED ORDER — LABETALOL HCL 5 MG/ML IV SOLN: 10.0000 mg | INTRAVENOUS | Status: DC | PRN

## 2014-07-08 MED ORDER — KCL IN DEXTROSE-NACL 20-5-0.9 MEQ/L-%-% IV SOLN
INTRAVENOUS | Status: DC
  Administered 2014-07-08: 23:00:00 via INTRAVENOUS
  Filled 2014-07-08 (×3): qty 1000

## 2014-07-08 MED ORDER — ACETAMINOPHEN 325 MG PO TABS: 325.0000 mg | ORAL_TABLET | ORAL | Status: DC | PRN

## 2014-07-08 MED ORDER — PROPOFOL 10 MG/ML IV BOLUS
INTRAVENOUS | Status: AC
  Filled 2014-07-08: qty 20

## 2014-07-08 MED ORDER — LISINOPRIL 10 MG PO TABS
10.0000 mg | ORAL_TABLET | Freq: Every day | ORAL | Status: DC
  Administered 2014-07-09: 10 mg via ORAL
  Filled 2014-07-08 (×2): qty 1

## 2014-07-08 MED ORDER — OXYCODONE-ACETAMINOPHEN 5-325 MG PO TABS
1.0000 | ORAL_TABLET | ORAL | Status: DC | PRN
  Administered 2014-07-08: 2 via ORAL
  Filled 2014-07-08: qty 2

## 2014-07-08 MED ORDER — FENTANYL CITRATE 0.05 MG/ML IJ SOLN: 25.0000 ug | INTRAMUSCULAR | Status: DC | PRN

## 2014-07-08 MED ORDER — LIDOCAINE HCL 4 % MT SOLN
OROMUCOSAL | Status: DC | PRN
  Administered 2014-07-08: 4 mL via TOPICAL

## 2014-07-08 MED ORDER — DOCUSATE SODIUM 100 MG PO CAPS
100.0000 mg | ORAL_CAPSULE | Freq: Every day | ORAL | Status: DC
  Administered 2014-07-09: 100 mg via ORAL
  Filled 2014-07-08: qty 1

## 2014-07-08 MED ORDER — SACCHAROMYCES BOULARDII 250 MG PO CAPS
250.0000 mg | ORAL_CAPSULE | Freq: Two times a day (BID) | ORAL | Status: DC
  Administered 2014-07-09: 250 mg via ORAL
  Filled 2014-07-08 (×3): qty 1

## 2014-07-08 MED ORDER — ONDANSETRON HCL 4 MG/2ML IJ SOLN: 4.0000 mg | Freq: Once | INTRAMUSCULAR | Status: DC | PRN

## 2014-07-08 MED ORDER — SODIUM CHLORIDE 0.9 % IV SOLN: 500.0000 mL | Freq: Once | INTRAVENOUS | Status: AC | PRN

## 2014-07-08 MED ORDER — MIDAZOLAM HCL 2 MG/2ML IJ SOLN
2.0000 mg | Freq: Once | INTRAMUSCULAR | Status: AC
  Administered 2014-07-08: 2 mg via INTRAVENOUS

## 2014-07-08 MED ORDER — PROTAMINE SULFATE 10 MG/ML IV SOLN
INTRAVENOUS | Status: AC
  Filled 2014-07-08: qty 10

## 2014-07-08 MED ORDER — ALUM & MAG HYDROXIDE-SIMETH 200-200-20 MG/5ML PO SUSP: 15.0000 mL | ORAL | Status: DC | PRN

## 2014-07-08 MED ORDER — NEOSTIGMINE METHYLSULFATE 10 MG/10ML IV SOLN
INTRAVENOUS | Status: DC | PRN
  Administered 2014-07-08: 5 mg via INTRAVENOUS

## 2014-07-08 MED ORDER — FENTANYL CITRATE 0.05 MG/ML IJ SOLN
INTRAMUSCULAR | Status: DC
Start: 2014-07-08 — End: 2014-07-08
  Filled 2014-07-08: qty 2

## 2014-07-08 MED ORDER — FENTANYL CITRATE 0.05 MG/ML IJ SOLN
100.0000 ug | Freq: Once | INTRAMUSCULAR | Status: AC
  Administered 2014-07-08: 100 ug via INTRAVENOUS

## 2014-07-08 MED ORDER — DEXTROSE 5 % IV SOLN
INTRAVENOUS | Status: DC | PRN
  Administered 2014-07-08: 16:00:00 via INTRAVENOUS

## 2014-07-08 MED ORDER — PROPOFOL 10 MG/ML IV BOLUS
INTRAVENOUS | Status: DC | PRN
  Administered 2014-07-08: 120 mg via INTRAVENOUS
  Administered 2014-07-08: 30 mg via INTRAVENOUS

## 2014-07-08 MED ORDER — MIDAZOLAM HCL 2 MG/2ML IJ SOLN
INTRAMUSCULAR | Status: AC
  Administered 2014-07-08: 2 mg via INTRAVENOUS
  Filled 2014-07-08: qty 2

## 2014-07-08 MED ORDER — PROBIOTIC DAILY PO CAPS: 1.0000 | ORAL_CAPSULE | Freq: Every day | ORAL | Status: DC

## 2014-07-08 MED ORDER — OXYCODONE HCL 5 MG/5ML PO SOLN: 5.0000 mg | Freq: Once | ORAL | Status: DC | PRN

## 2014-07-08 MED ORDER — AMLODIPINE BESYLATE 10 MG PO TABS
10.0000 mg | ORAL_TABLET | Freq: Every day | ORAL | Status: DC
  Administered 2014-07-09: 10 mg via ORAL
  Filled 2014-07-08 (×2): qty 1

## 2014-07-08 MED ORDER — METOPROLOL SUCCINATE ER 100 MG PO TB24
100.0000 mg | ORAL_TABLET | Freq: Every day | ORAL | Status: DC
  Administered 2014-07-09: 100 mg via ORAL
  Filled 2014-07-08 (×2): qty 1

## 2014-07-08 MED ORDER — PHENYLEPHRINE 40 MCG/ML (10ML) SYRINGE FOR IV PUSH (FOR BLOOD PRESSURE SUPPORT)
PREFILLED_SYRINGE | INTRAVENOUS | Status: AC
  Filled 2014-07-08: qty 10

## 2014-07-08 MED ORDER — HEPARIN SODIUM (PORCINE) 1000 UNIT/ML IJ SOLN
INTRAMUSCULAR | Status: DC | PRN
  Administered 2014-07-08: 10000 [IU] via INTRAVENOUS

## 2014-07-08 SURGICAL SUPPLY — 76 items
ADH SKN CLS APL DERMABOND .7 (GAUZE/BANDAGES/DRESSINGS) ×1
BAG BANDED W/RUBBER/TAPE 36X54 (MISCELLANEOUS) ×2 IMPLANT
BAG EQP BAND 135X91 W/RBR TAPE (MISCELLANEOUS) ×1
BAG SNAP BAND KOVER 36X36 (MISCELLANEOUS) ×2 IMPLANT
BALLN CODA OCL 2-10-35-140-46 (BALLOONS) ×2
BALLOON COD OCL 2-10-35-140-46 (BALLOONS) IMPLANT
BLADE SURG CLIPPER 3M 9600 (MISCELLANEOUS) ×2 IMPLANT
CANISTER SUCTION 2500CC (MISCELLANEOUS) ×2 IMPLANT
CATH BEACON 5.038 65CM KMP-01 (CATHETERS) ×1 IMPLANT
CATH OMNI FLUSH .035X70CM (CATHETERS) ×1 IMPLANT
CATH VANSCH 5FR 6CM (CATHETERS) ×1 IMPLANT
CLIP TI MEDIUM 24 (CLIP) IMPLANT
CLIP TI WIDE RED SMALL 24 (CLIP) IMPLANT
COVER DOME SNAP 22 D (MISCELLANEOUS) ×2 IMPLANT
COVER MAYO STAND STRL (DRAPES) ×2 IMPLANT
COVER PROBE W GEL 5X96 (DRAPES) ×2 IMPLANT
COVER SURGICAL LIGHT HANDLE (MISCELLANEOUS) ×2 IMPLANT
DERMABOND ADVANCED (GAUZE/BANDAGES/DRESSINGS) ×1
DERMABOND ADVANCED .7 DNX12 (GAUZE/BANDAGES/DRESSINGS) ×1 IMPLANT
DEVICE CLOSURE PERCLS PRGLD 6F (VASCULAR PRODUCTS) IMPLANT
DEVICE TORQUE H2O (MISCELLANEOUS) ×1 IMPLANT
DRAPE TABLE COVER HEAVY DUTY (DRAPES) ×2 IMPLANT
DRSG TEGADERM 2-3/8X2-3/4 SM (GAUZE/BANDAGES/DRESSINGS) ×3 IMPLANT
DRYSEAL FLEXSHEATH 12FR 33CM (SHEATH) ×1
DRYSEAL FLEXSHEATH 18FR 33CM (SHEATH) ×2
ELECT CAUTERY BLADE 6.4 (BLADE) ×2 IMPLANT
ELECT REM PT RETURN 9FT ADLT (ELECTROSURGICAL) ×4
ELECTRODE REM PT RTRN 9FT ADLT (ELECTROSURGICAL) ×2 IMPLANT
EXCLUDER TNK LEG 35MX14X14 (Endovascular Graft) IMPLANT
EXCLUDER TRUNK LEG 35MX14X14 (Endovascular Graft) ×2 IMPLANT
GAUZE SPONGE 2X2 8PLY STRL LF (GAUZE/BANDAGES/DRESSINGS) ×2 IMPLANT
GLOVE BIO SURGEON STRL SZ7.5 (GLOVE) ×2 IMPLANT
GLOVE BIOGEL PI IND STRL 8 (GLOVE) ×1 IMPLANT
GLOVE BIOGEL PI INDICATOR 8 (GLOVE) ×1
GOWN STRL REUS W/ TWL LRG LVL3 (GOWN DISPOSABLE) ×3 IMPLANT
GOWN STRL REUS W/TWL LRG LVL3 (GOWN DISPOSABLE) ×6
GRAFT BALLN CATH 65CM (STENTS) IMPLANT
GUIDEWIRE ANGLED .035X150CM (WIRE) ×1 IMPLANT
KIT BASIN OR (CUSTOM PROCEDURE TRAY) ×2 IMPLANT
KIT ROOM TURNOVER OR (KITS) ×2 IMPLANT
LEG CONTRALATERAL 16X18X9.5 (Endovascular Graft) ×1 IMPLANT
LEG CONTRALATERAL 18X11.5 (Endovascular Graft) ×1 IMPLANT
LEG CONTRALATERAL 36X4.5 (Permanent Stent) ×1 IMPLANT
NDL PERC 18GX7CM (NEEDLE) ×1 IMPLANT
NEEDLE PERC 18GX7CM (NEEDLE) ×2 IMPLANT
NS IRRIG 1000ML POUR BTL (IV SOLUTION) ×2 IMPLANT
PACK AORTA (CUSTOM PROCEDURE TRAY) ×2 IMPLANT
PAD ARMBOARD 7.5X6 YLW CONV (MISCELLANEOUS) ×4 IMPLANT
PENCIL BUTTON HOLSTER BLD 10FT (ELECTRODE) IMPLANT
PERCLOSE PROGLIDE 6F (VASCULAR PRODUCTS) ×8
PROTECTION STATION PRESSURIZED (MISCELLANEOUS) ×2
SHEATH AVANTI 11CM 8FR (MISCELLANEOUS) ×1 IMPLANT
SHEATH BRITE TIP 8FR 23CM (MISCELLANEOUS) ×1 IMPLANT
SHEATH DRYSEAL FLEX 12FR 33CM (SHEATH) IMPLANT
SHEATH DRYSEAL FLEX 18FR 33CM (SHEATH) IMPLANT
SHIELD RADPAD SCOOP 12X17 (MISCELLANEOUS) ×2 IMPLANT
SPONGE GAUZE 2X2 STER 10/PKG (GAUZE/BANDAGES/DRESSINGS) ×1
STAPLER VISISTAT (STAPLE) IMPLANT
STATION PROTECTION PRESSURIZED (MISCELLANEOUS) IMPLANT
STENT GRAFT BALLN CATH 65CM (STENTS) ×1
STOPCOCK MORSE 400PSI 3WAY (MISCELLANEOUS) ×2 IMPLANT
SUT PROLENE 5 0 C 1 24 (SUTURE) IMPLANT
SUT VIC AB 2-0 CTB1 (SUTURE) IMPLANT
SUT VIC AB 3-0 SH 27 (SUTURE)
SUT VIC AB 3-0 SH 27X BRD (SUTURE) IMPLANT
SUT VICRYL 4-0 PS2 18IN ABS (SUTURE) ×4 IMPLANT
SYR 20CC LL (SYRINGE) ×4 IMPLANT
SYR 30ML LL (SYRINGE) IMPLANT
SYR MEDRAD MARK V 150ML (SYRINGE) ×2 IMPLANT
SYRINGE 10CC LL (SYRINGE) ×6 IMPLANT
TOWEL OR 17X24 6PK STRL BLUE (TOWEL DISPOSABLE) ×4 IMPLANT
TOWEL OR 17X26 10 PK STRL BLUE (TOWEL DISPOSABLE) ×2 IMPLANT
TRAY FOLEY CATH 16FRSI W/METER (SET/KITS/TRAYS/PACK) ×3 IMPLANT
TUBING HIGH PRESSURE 120CM (CONNECTOR) ×2 IMPLANT
WIRE AMPLATZ SS-J .035X180CM (WIRE) ×2 IMPLANT
WIRE BENTSON .035X145CM (WIRE) ×2 IMPLANT

## 2014-07-08 NOTE — Interval H&P Note (Signed)
History and Physical Interval Note:  07/08/2014 9:21 AM  Philip [name]  has presented today for surgery, with the diagnosis of Abdominal aneurysm without mention of rupture   The various methods of treatment have been discussed with the patient and family. After consideration of risks, benefits and other options for treatment, the patient has consented to  Procedure(s): ABDOMINAL AORTIC ENDOVASCULAR STENT GRAFT/ GORE (N/A) as a surgical intervention .  The patient's history has been reviewed, patient examined, no change in status, stable for surgery.  I have reviewed the patient's chart and labs.  Questions were answered to the patient's satisfaction.     Philip [name] S

## 2014-07-08 NOTE — Transfer of Care (Signed)
Immediate Anesthesia Transfer of Care Note  Patient: Philip [name]  Procedure(s) Performed: Procedure(s): ABDOMINAL AORTIC ENDOVASCULAR STENT GRAFT/ GORE (N/A)  Patient Location: PACU  Anesthesia Type:General  Level of Consciousness: awake, oriented, sedated, patient cooperative and responds to stimulation  Airway & Oxygen Therapy: Patient Spontanous Breathing and Patient connected to nasal cannula oxygen  Post-op Assessment: Report given to PACU RN, Post -op Vital signs reviewed and stable, Patient moving all extremities and Patient moving all extremities X 4  Post vital signs: Reviewed and stable  Complications: No apparent anesthesia complications

## 2014-07-08 NOTE — Op Note (Signed)
NAME: Philip [name]   MRN: [id number] DOB: September 25, 1941    DATE OF OPERATION: 07/08/2014  PREOP DIAGNOSIS: 7 cm abdominal aortic aneurysm  POSTOP DIAGNOSIS: same  PROCEDURE: Percutaneous endovascular repair of abdominal aortic aneurysm using 4 components  Co-SURGEON's: Judeth Cornfield. Scot [name], MD, Adele Barthel M.D.  ANESTHESIA: Gen.   EBL: minimal  INDICATIONS: Philip [name] is a 73 y.o. male who was found to have a 7 cm infrarenal abdominal aortic aneurysm. He underwent preoperative cardiac workup and presents for elective repair.  FINDINGS: Initial completion arteriogram showed a subtle type I endoleak. Therefore a cuff was placed proximally and the proximal graft was re\re ballooned. Completion film showed no residual endoleak. Both renal arteries were patent and both hypogastric arteries were patent at the completion.  TECHNIQUE: The patient was taken to the operating room after monitoring lines were placed by anesthesia. The abdomen and groins were prepped and draped in usual sterile fashion after the patient received a general anesthetic. On the left side, Dr. Adele Barthel preclosed the left common femoral artery is dictated separately. On the right side, under ultrasound guidance, the right common femoral artery was cannulated and a guidewire introduced into the infrarenal aorta. Tract of the wire was dilated. The initial Perclose device was placed and rotated 15 laterally. This was then exchanged over a wire for a second Perclose device which was rotated 15 medially. A short 8 French sheath was then placed through the right common femoral artery. And exchanged for [facility] wire for an Amplatz wire using an exchange catheter. The patient was heparinized with 10,000 units of IV heparin.  After the 12 French sheath had been introduced from the left side, I introduced the 18 [place] sheath over the Samson wire in the right and this was advanced to below the level of the renal arteries. The trunk  ipsilateral component was selected and this was a 35 mm x 14 mm x 14 cm device. Pigtail catheter had been positioned above the renals from the left side. The trunk ipsilateral component was advanced to the level of the renal arteries and the contralateral gate was positioned to the patient's right side slightly anteriorly. A juxtarenal aortogram was obtained with a 30 of cranial angulation and 10 of LAO projection to best visualize the proximal neck of the aneurysm. The proximal graft and contralateral gate were then deployed. This appeared be in good position and was right up to the level of the left renal artery which was the lowest of the renal arteries. It was slightly below the level of the right renal artery but this was related to the angulation of the graft.  Next the contralateral limb was cannulated as dictated by Dr. Bridgett Larsson and the contralateral limb was selected after a retrograde iliac arteriogram was obtained through the left femoral sheath in order to demonstrate position of the hypogastric artery on the left. This was an 18 mm x 11.5 cm device. This was overlapped 3 cm into the contralateral gate and then deployed to just above the level of the hypogastric artery and was in good position. Next the ipsilateral limb was fully deployed. A retrograde right iliac arteriogram was obtained in order to demonstrate position of the hypogastric artery on the right. An 18 mm x 11.5 cm device was selected and this was positioned into the distal lateral limb and advanced enough so that the distal aspect of the graft would be just above the hypogastric artery on the right. This was  deployed without difficulty.  Next using a Q-50 balloon, the proximal graft site was ballooned as were the overlap regions in the distal graft. Completion arteriogram with using the pigtail catheter from the left side suggested possibly a type I endoleak all on the right lateral aspect of the graft. I re\re ballooned this area but  there still was a subtle blush suggesting a type I endoleak. For this reason I elected to place a cuff. In order to allow better opposition on the right side of the infrarenal aorta I elected to exchange the 12 French sheath on the left for an 32 [place] eighth so that we could come from the left side in order to better position the right side of the graft below the right renal artery. A 36 mm x 4.5 cm cuff was positioned such that it would not occlude the left renal artery and would extend up higher closer to the right renal artery. This was deployed after arteriogram was obtained to confirm correct position. I then ballooned at this proximal graft with the [facility] balloon and the inflation was maintained for 1 minute. Completion film showed no residual endoleak.  Each of the previously preclosed common femoral arteries were closed using 2 Perclose devices with good hemostasis obtained. The heparin was then partially reversed with protamine. The small incisions were closed with 4-0 Vicryl sutures on each side. Dermabond was applied. There were good Doppler signals in both feet at completion. The patient tolerated the procedure well transferred to the recovery room in stable condition. All needle and sponge counts were correct.  Deitra Mayo, MD, FACS [facility]  DATE OF DICTATION:   07/08/2014

## 2014-07-08 NOTE — Anesthesia Procedure Notes (Addendum)
Procedure Name: Intubation Date/Time: 07/08/2014 3:45 PM Performed by: Jacquiline Doe A Pre-anesthesia Checklist: Emergency Drugs available, Patient identified, Suction available, Patient being monitored and Timeout performed Patient Re-evaluated:Patient Re-evaluated prior to inductionOxygen Delivery Method: Circle system utilized Preoxygenation: Pre-oxygenation with 100% oxygen Intubation Type: IV induction and Cricoid Pressure applied Ventilation: Mask ventilation without difficulty and Oral airway inserted - appropriate to patient size Laryngoscope Size: Mac and 4 Grade View: Grade I Tube type: Oral Tube size: 7.5 mm Number of attempts: 1 Airway Equipment and Method: Stylet and LTA kit utilized Placement Confirmation: ETT inserted through vocal cords under direct vision,  positive ETCO2 and breath sounds checked- equal and bilateral Secured at: 23 cm Tube secured with: Tape Dental Injury: Teeth and Oropharynx as per pre-operative assessment  Comments: Dr. Tamala Julian present throughout Induction .

## 2014-07-08 NOTE — H&P (View-Only) (Signed)
Patient ID: Philip [name], male   DOB: 1940/12/30, 73 y.o.   MRN: [id number]  Reason for Consult: AAA   Referred by Colon Branch, MD  Subjective:     HPI:  Philip [name] is a 73 y.o. male who was found to have a pulsatile abdominal mass. This prompted an ultrasound which was done on 04/24/2014 which showed a 7 cm infrarenal abdominal aortic aneurysm. His CT scan showed some reversed taper of the neck of the aneurysm and also short common iliac arteries. Given these issues, he underwent an arteriogram in order to help determine if he is a candidate for EVAR.  He denies any history of abdominal pain or back pain.  The patient has a history of coronary artery disease. He underwent coronary revascularization in 1999. He underwent preoperative cardiac evaluation by Dr. [name]. He had an intermediate risk Lexiscan Cardiolite with anterior and apical ischemia. This reason he underwent heart cath on 05/14/2014 which showed patent graft with no intervention needed. There were no ischemic symptoms. He was cleared for surgery. Dr. [name] Dubin also notes his blood pressure has been under good control.  Past Medical History  Diagnosis Date  . Hyperlipidemia   . Hypertension   . CAD (coronary artery disease)     had a MI , s/p CABG  . Skin cancer     several , one of them was melanoma (sees derm routinely)  . OSA (obstructive sleep apnea)     started CPAP 12/09  . Myocardial infarction   . Peripheral vascular disease   . AAA (abdominal aortic aneurysm)    Family History  Problem Relation Age of Onset  . Sleep apnea Brother   . Colon cancer Other     aunt  . Prostate cancer Father 82  . Heart disease Mother   . Skin cancer Mother   . Diabetes Neg Hx   . Stroke Neg Hx   . Heart disease Father   . Skin cancer Father   . Skin cancer Brother   . Skin cancer Sister   . Heart attack Mother   . Heart attack Father    Past Surgical History  Procedure Laterality Date  . Coronary artery  bypass graft  1999  . Penile prosthesis implant  08/2005  . Rhinoplasty  1975    Short Social History:  History  Substance Use Topics  . Smoking status: Former Smoker    Quit date: 09/14/1977  . Smokeless tobacco: Never Used  . Alcohol Use: 3.0 oz/week    2 Glasses of wine, 1 Cans of beer, 2 Shots of liquor per week     Comment: socially    No Known Allergies  Current Outpatient Prescriptions  Medication Sig Dispense Refill  . amLODipine (NORVASC) 10 MG tablet Take 1 tablet (10 mg total) by mouth daily.  90 tablet  3  . aspirin EC 81 MG tablet Take 1 tablet (81 mg total) by mouth daily.      Marland Kitchen atorvastatin (LIPITOR) 40 MG tablet Take 1 tablet (40 mg total) by mouth daily.  90 tablet  1  . B Complex-Biotin-FA (B-COMPLEX PO) Take 1 tablet by mouth daily.      . Cholecalciferol (VITAMIN D3) 5000 UNITS CAPS Take 5,000 Units by mouth daily.      Marland Kitchen co-enzyme Q-10 30 MG capsule Take 30 mg by mouth daily.      Marland Kitchen lisinopril (PRINIVIL,ZESTRIL) 10 MG tablet Take 5 mg by mouth daily. 1/2  of a 20mg  tablet daily      . magnesium oxide (MAG-OX) 400 MG tablet Take 800 mg by mouth daily.      . metoprolol succinate (TOPROL-XL) 100 MG 24 hr tablet Take 1 tablet (100 mg total) by mouth daily. Take with or immediately following a meal.  90 tablet  3  . Multiple Vitamins-Minerals (MULTIVITAMIN WITH MINERALS) tablet Take 1 tablet by mouth daily.      . Omega-3 Fatty Acids (FISH OIL) 1200 MG CAPS Take 1,200 mg by mouth daily.      Marland Kitchen omeprazole (PRILOSEC OTC) 20 MG tablet Take 20 mg by mouth daily.      . Probiotic Product (PROBIOTIC DAILY) CAPS Take 1 capsule by mouth daily.       No current facility-administered medications for this visit.    Review of Systems  Constitutional: Negative for chills and fever.  Eyes: Negative for loss of vision.  Respiratory: Positive for shortness of breath. Negative for cough and wheezing.  Cardiovascular: Negative for chest pain, chest tightness, claudication,  dyspnea with exertion, orthopnea and palpitations.  GI: Negative for blood in stool and vomiting.  GU: Negative for dysuria and hematuria.  Musculoskeletal: Negative for leg pain, joint pain and myalgias.  Skin: Negative for rash and wound.  Neurological: Negative for dizziness and speech difficulty.  Hematologic: Negative for bruises/bleeds easily. Psychiatric: Negative for depressed mood.        Objective:  Objective  Filed Vitals:   06/19/14 1249  BP: 143/81  Pulse: 60  Resp: 18  Height: 5\' 9"  (1.753 m)  Weight: 235 lb (106.595 kg)   Body mass index is 34.69 kg/(m^2).  Physical Exam  Constitutional: He is oriented to person, place, and time. He appears well-developed and well-nourished.  HENT:  Head: Normocephalic and atraumatic.  Neck: Neck supple. No JVD present. No thyromegaly present.  Cardiovascular: Normal rate, regular rhythm and normal heart sounds.  Exam reveals no friction rub.   No murmur heard. Pulses:      Femoral pulses are 2+ on the right side, and 2+ on the left side.      Popliteal pulses are 2+ on the right side, and 2+ on the left side.       Dorsalis pedis pulses are 2+ on the right side, and 2+ on the left side.       Posterior tibial pulses are 2+ on the right side, and 2+ on the left side.  Pulmonary/Chest: Breath sounds normal. He has no wheezes. He has no rales.  Abdominal: Soft. Bowel sounds are normal. There is no tenderness.  His aneurysm is palpable and nontender.  Musculoskeletal: Normal range of motion. He exhibits no edema.  Lymphadenopathy:    He has no cervical adenopathy.  Neurological: He is alert and oriented to person, place, and time. He has normal strength. No sensory deficit.  Skin: No lesion and no rash noted.  Psychiatric: He has a normal mood and affect.    Data: His creatinine is 1.1 on 06/16/2014.   His angiogram shows some reversed taper in his neck. The left common iliac is short and does have some atherosclerotic  disease. There is a stenosis in the proximal left hypogastric artery.     Assessment/Plan:    AAA (abdominal aortic aneurysm) without rupture This patient has a 7 cm infrarenal abdominal aortic aneurysm. I have explained that the risk of rupture for aneurysm of this size is 15-20% per year. We have discussed the option  of endovascular versus open repair. I have explained that I have a couple of concerns about EVAR. First, he does have some reversed taper in the proximal neck. In addition, the left common iliac artery especially is very short and it is possible that we would have to extend down into the external iliac artery with the small risk of compromising flow to the pelvis or colon. I've also reviewed the potential long-term risks of EVAR including the risk of continued aneurysm expansion and type II endoleak. We also discussed open repair and the potential complications associated with this including a 4-5 percent risk of mortality or major morbidity. He also understands that if we were to perform EVAR there is a chance that he would require conversion to open repair. He would like to proceed with the EVAR. We should be able to schedule this in the near future. Of note he has undergone preoperative cardiac clearance as described above.   Angelia Mould MD [facility]

## 2014-07-08 NOTE — Progress Notes (Signed)
Glasses placed in a labeled bag and placed on chart

## 2014-07-08 NOTE — Anesthesia Postprocedure Evaluation (Signed)
  Anesthesia Post-op Note  Patient: Philip [name]  Procedure(s) Performed: Procedure(s): ABDOMINAL AORTIC ENDOVASCULAR STENT GRAFT/ GORE (N/A)  Patient Location: PACU  Anesthesia Type:General  Level of Consciousness: awake, alert  and oriented  Airway and Oxygen Therapy: Patient Spontanous Breathing and Patient connected to nasal cannula oxygen  Post-op Pain: mild  Post-op Assessment: Post-op Vital signs reviewed, Patient's Cardiovascular Status Stable, Respiratory Function Stable, Patent Airway and Pain level controlled  Post-op Vital Signs: stable  Last Vitals:  Filed Vitals:   07/08/14 1845  BP:   Pulse: 61  Temp:   Resp: 14    Complications: No apparent anesthesia complications

## 2014-07-08 NOTE — Op Note (Signed)
OPERATIVE NOTE   PROCEDURE:  1. Bilateral common femoral artery cannulation under ultrasound guidance 2. "Preclose" repair bilateral common femoral artery  3. Placement of catheter in aorta x 2 4. Aortogram 5. Repair of aorta with modular bifurcated prosthesis with one limb (35 mm x 14 mm x 14 cm) 6. Placement of Right iliac limb (18 mm x 9.5 cm) 7. Placement of Left iliac limb (18 mm x 11.5 cm) 8. Placement aortic cuff (36 mm x 4.5 cm) 9. Radiology S&I  PRE-OPERATIVE DIAGNOSIS: large abdominal aortic aneurysm   POST-OPERATIVE DIAGNOSIS: same as above   CO-SURGEONS: Gae Gallop, MD; Adele Barthel, MD   ANESTHESIA: general   ESTIMATED BLOOD LOSS: 100 cc   FINDING(S):  1. Type I endoleak initially visualize: resolved after cuff placement and angioplasty with Coda balloon 2. Successful exclusion of abdominal aortic aneurysm   INDICATIONS:  Philip [name] is a 73 y.o. male  who presents with large abdominal aortic aneurysm, 6.5 cm. The patient is aware the risks of endovascular aortic surgery include but are not limited to: bleeding, need for transfusion, infection, death, stroke, paralysis, wound complications, impotence, bowel ischemia, extended ventilation and need for secondary procedures. Overall, Dr. Scot [name] cited a mortality rate of 1-2% and morbidity rate of 15%.   DESCRIPTION:  After full informed written consent was obtained from the patient, he was brought back to the operating room, amd placed supine upon the operating table. He already had a A-line place. After obtaining adequate anesthesia, a Foley catheter was placed to monitor urine output in the operating room. He was prepped and draped in the standard fashion for either open aneurysm repair or endovascular aneurysm repair. Dr. Scot [name] first turned his attention to the right groin. Under ultrasound guidance, he identified the common femoral artery and made a stab incision over it. He dissected down to the artery in  the groin with a hemostat and then cannulated it with a 18-gauge needle. He then passed a Benson wire up into the iliac system. He dilated the skin tract with a 8-Fr dilator. The first ProGlide was deployed and rotated medially.  The sutures were removed and tagged and the wire replaced in the Proglide device.  The Proglide was exchanged for a new ProGlide, which was deployed and rotated laterally. In such fashion, he completed the pre-close technique on his side and then replaced the [facility] wire up into the aorta. Initially, a short 8-French sheath was loaded over the wire. Using a pigtail catheter this Britta Mccreedy wire was exchanged for an Amplatz wire.   At this point, I turned my attention to the left groin. In a similar fashion, I completed the "Pre-close" technique on the left common femoral artery. I placed a long 8-Fr sheath on this side. I was able to get a [facility] wire into the suprarenal aorta. I place a pigtail catheter over the wire and placed in at L2 near the renal arteries. The wire was exchanged for an Amplatz wire.  The left sheath was exchanged for a 12-Fr [place]seal sheath, which was lodged in the aorta.  The pigtail catheter was loaded over the wire into the suprarenal position.  The wire was removed and the catheter connected to the power injector circuit.   Dr. Scot [name] then exchanged the right sheath for the 18-Fr Dryseal sheat, which was hubbed. The dilator was removed. The patient was given 10000 units of Heparin which was a therapeutic bolus for the patient.  A power injector aortogram via  the pigtail catheter was completed to determine the position of the renal arteries. Dr. Scot [name] delivered the main body device, 35 mm x 14-mm x 14-cm, below the level of the left renal artery, which was the lower renal artery. He deployed the main body device, with the expected drop in the device 1 mm below the left renal and 4-5 mm from the right renal artery.   At this point, I put an Amplatz wire back up  the pigtail catheter in order to complete a buddy wire technique for cannulation. I placed an additional Benson wire and KMP through the left Dryseal sheath. I exchanged the wire for a Glidewire. Using the KMP and Glidewire, I was able to cannulated the contralateral gate. I advanced both the catheter and wire through the graft. The catheter was exchanged for a pigtail catheter and was then pulled down into the graft. I was able to rotate the pigtail, verifying intragraft position. I then pulled the catheter down to the flow divider. The left sheath was pulled until distal to the left internal iliac artery. A hand injection verified the position of the left internal iliac artery. Based on measurements, a 18 mm x 11.5 cm limb was selected. I deployed this limb with adequate overlap in the main body. The stent deployment device was removed.   At this point, Dr. Scot [name] pulled back the right sheath and did a hand injection to verify the right internal iliac artery position. Based on the images, a right limb extension, 18 mm x 9.5 cm, was selected.  At this point, Dr. Scot [name] fully deployed the main body, fully extending the partially constrained limb and also releasing the device from the delivery system. The stent delivery system was then removed. The right limb extension was over the wire and deployed, taking care to avoid covering the right internal iliac artery.  Dr. Scot [name] then obtained a Q50 molding balloon which was I used to mold the top of the graft. I also molded the entire ipsilateral iliac limb. The balloon was removed. I placed the balloon over the left wire and balloon the overlap points and the entire contralateral iliac limb.  The balloon was deflated and removed.  The pigtail catheter was placed in the suprarenal position over the left wire and the wire removed. The catheter was connected to the power injector circuit. Completion aortogram demonstrated a right side Type I endoleak.  The graft was  further down due the angulation of the neck.  At this point, the plan was placement of an aortic cuff, 36 mm x 4.5 cm via the left femoral sheath due to better lay of the wire in the aorta.    At this point, an Amplatz wire was placed through the pigtail on the left and the catheter removed.  The sheath was exchanged for a 18-Fr sheath, which advanced into the contralateral iliac limb.  I removed the dilator.  Dr. Scot [name] then placed the aortic cuff over the left wire.  The pigtail catheter was loaded over the right wire and an aortogram completed to clearly identify the level of the renal arteries.  The aortic cuff was then deployed below the level of the left renal artery, lowest artery.  The cuff was molded with the Q50 balloon and removed.  Completion aortogram demonstrates persistence of a small Type I right sided endoleak.  The balloon was exchanged for a non-compliant Coda balloon.  I was used to more forcefully mold the proximal main body and aortic  cuff to the aortic neck.  The balloon was held for 1 minute.  The completion aortogram demonstrated no endoleak at this point with resolution of the right gutter Type I endoleak.   Both renal arteries were patent as were distal iliac vessels.  The balloon was removed.    At this point, Dr. Scot [name] placed the [facility] in the pigtail catheter and removed the catheter. The right sheath was removed and pressure held. The right common femoral artery was repaired by cinching down the ProGlide sutures  in two stages. In the first stage, the sutures were tightened with the wire left in place after removing the sheath. There was minimal blood loss after the first stage, with the wire in place. The wire was then removed from the right groin and the sutures re-cinched and the white tightening stitches were pulled to fully deploy the Proglide devices. All four sutures were held in place with a hemostat with tension on the skin. In a similar fashion, I repaired the left  common femoral artery. The patient was given 40 mg of Protamine to fully reverse anticoagulation. After waiting a few minutes, there was no further significant bleeding from either groin cannulation site so the sutures were transected with suture transection devices. Each groin was repaired with a U stitch of 4-0 Vicryl.   SPECIMEN(S): none   COMPLICATIONS: none   CONDITION: stable   Adele Barthel, MD [facility] [place] Office: [phone] Pager: [phone]  07/08/2014, 6:01 PM

## 2014-07-08 NOTE — Anesthesia Preprocedure Evaluation (Addendum)
Anesthesia Evaluation  Patient identified by MRN, date of birth, ID band Patient awake    Reviewed: Allergy & Precautions, H&P , NPO status , Patient's Chart, lab work & pertinent test results, reviewed documented beta blocker date and time   Airway       Dental   Pulmonary former smoker,          Cardiovascular hypertension, Rhythm:Regular     Neuro/Psych    GI/Hepatic   Endo/Other    Renal/GU      Musculoskeletal   Abdominal   Peds  Hematology   Anesthesia Other Findings   Reproductive/Obstetrics                          Anesthesia Physical Anesthesia Plan  ASA: III  Anesthesia Plan: General   Post-op Pain Management:    Induction: Intravenous  Airway Management Planned: Oral ETT  Additional Equipment: Arterial line and CVP  Intra-op Plan:   Post-operative Plan:   Informed Consent: I have reviewed the patients History and Physical, chart, labs and discussed the procedure including the risks, benefits and alternatives for the proposed anesthesia with the patient or authorized representative who has indicated his/her understanding and acceptance.   Dental advisory given  Plan Discussed with: CRNA and Anesthesiologist  Anesthesia Plan Comments:         Anesthesia Quick Evaluation

## 2014-07-09 ENCOUNTER — Other Ambulatory Visit: Payer: Self-pay | Admitting: *Deleted

## 2014-07-09 DIAGNOSIS — Z48812 Encounter for surgical aftercare following surgery on the circulatory system: Secondary | ICD-10-CM

## 2014-07-09 DIAGNOSIS — I714 Abdominal aortic aneurysm, without rupture, unspecified: Secondary | ICD-10-CM

## 2014-07-09 LAB — BASIC METABOLIC PANEL
Anion gap: 11 (ref 5–15)
BUN: 21 mg/dL (ref 6–23)
CHLORIDE: 102 meq/L (ref 96–112)
CO2: 24 mEq/L (ref 19–32)
CREATININE: 0.95 mg/dL (ref 0.50–1.35)
Calcium: 8.5 mg/dL (ref 8.4–10.5)
GFR calc Af Amer: >90 mL/min (ref >90)
GFR calc non Af Amer: 80 mL/min — ABNORMAL LOW (ref >90)
GLUCOSE: 140 mg/dL — AB (ref 70–99)
Potassium: 4.5 mEq/L (ref 3.7–5.3)
Sodium: 137 mEq/L (ref 137–147)

## 2014-07-09 LAB — CBC
HEMATOCRIT: 38.7 % — AB (ref 39.0–52.0)
Hemoglobin: 13.2 g/dL (ref 13.0–17.0)
MCH: 30 pg (ref 26.0–34.0)
MCHC: 34.1 g/dL (ref 30.0–36.0)
MCV: 88 fL (ref 78.0–100.0)
Platelets: 135 10*3/uL — ABNORMAL LOW (ref 150–400)
RBC: 4.4 MIL/uL (ref 4.22–5.81)
RDW: 13.8 % (ref 11.5–15.5)
WBC: 8 10*3/uL (ref 4.0–10.5)

## 2014-07-09 MED ORDER — OXYCODONE-ACETAMINOPHEN 5-325 MG PO TABS: 1.0000 | ORAL_TABLET | Freq: Four times a day (QID) | ORAL | Status: DC | PRN

## 2014-07-09 NOTE — Plan of Care (Signed)
Problem: Consults Goal: Diagnosis CEA/CES/AAA Stent Outcome: Completed/Met Date Met:  07/09/14 Abdominal Aortic Aneurysm Stent (AAA)

## 2014-07-09 NOTE — Progress Notes (Signed)
Pt ambulated 300 ft around the unit, steady gait without assistive device, nurse was stand by assist. Pt ate solid breakfast and tolerated well, pt has also voided. IV antibiotic hung, will plan to d/c central line after antibiotics complete.

## 2014-07-09 NOTE — Progress Notes (Signed)
   VASCULAR SURGERY ASSESSMENT & PLAN:  * 1 Day Post-Op s/p: EVAR  * D/C Foley. D/C a-line  * Ambulate  * Home this AM.   SUBJECTIVE: No complaints.  PHYSICAL EXAM: Filed Vitals:   07/08/14 2000 07/08/14 2015 07/08/14 2214 07/09/14 0328  BP:   126/59 114/48  Pulse: 67 68 53 53  Temp:  97.4 F (36.3 C) 97.7 F (36.5 C) 97.3 F (36.3 C)  TempSrc:   Oral Oral  Resp: 18 19 16 13   Height:  5\' 9"  (1.753 m)    Weight:  235 lb 7.2 oz (106.8 kg)    SpO2: 100% 100% 97% 98%   Groin sites look fine. Palpable PT pulses  LABS: Lab Results  Component Value Date   WBC 7.2 07/08/2014   HGB 12.8* 07/08/2014   HCT 37.5* 07/08/2014   MCV 88.0 07/08/2014   PLT 130* 07/08/2014   Lab Results  Component Value Date   CREATININE 0.91 07/08/2014   Active Problems:   AAA (abdominal aortic aneurysm) without rupture  Gae Gallop Beeper: [phone] 07/09/2014

## 2014-07-09 NOTE — Progress Notes (Addendum)
Discharge instructions reviewed with pt and his wife. Copy of instructions and script given to pt. Pt able to teach back education/instructions on when to remove dressings from where A-line site and central IV line site were, as well as bilat groin dsgs per MD instructions.  Pt d/c'd via wheelchair with belongings with wife, escorted by hospital volunteer.

## 2014-07-09 NOTE — Progress Notes (Signed)
Utilization review completed.  

## 2014-07-09 NOTE — Care Management Note (Signed)
    Page 1 of 1   07/09/2014     12:21:18 PM CARE MANAGEMENT NOTE 07/09/2014  Patient:  Philip [name], Philip [name]   Account Number:  [id number]  Date Initiated:  07/09/2014  Documentation initiated by:  Marvetta Gibbons  Subjective/Objective Assessment:   Pt admitted s/p AAA repair     Action/Plan:   PTA pt lived at home   Anticipated DC Date:  07/09/2014   Anticipated DC Plan:  HOME/SELF CARE         Choice offered to / List presented to:             Status of service:  Completed, signed off Medicare Important Message given?  NA - LOS <3 / Initial given by admissions (If response is "NO", the following Medicare IM given date fields will be blank) Date Medicare IM given:   Medicare IM given by:   Date Additional Medicare IM given:   Additional Medicare IM given by:    Discharge Disposition:  HOME/SELF CARE  Per UR Regulation:  Reviewed for med. necessity/level of care/duration of stay  If discussed at [place] of Stay Meetings, dates discussed:    Comments:

## 2014-07-10 ENCOUNTER — Encounter (HOSPITAL_COMMUNITY): Payer: Self-pay | Admitting: Vascular Surgery

## 2014-07-10 ENCOUNTER — Telehealth: Payer: Self-pay | Admitting: Vascular Surgery

## 2014-07-10 NOTE — Telephone Encounter (Signed)
Message copied by Gena Fray on Thu Jul 10, 2014  4:27 PM ------      Message from: Peter Minium K      Created: Wed Jul 09, 2014  9:32 AM      Regarding: schedule                   ----- Message -----         From: Ulyses Amor, PA-C         Sent: 07/09/2014   8:08 AM           To: Vvs Charge Pool            CTA abdomin pelvis f/u 4 weeks with Dr. Scot [name] post EVAR ------

## 2014-07-11 NOTE — Telephone Encounter (Signed)
Message copied by Gena Fray on Fri Jul 11, 2014  3:04 PM ------      Message from: Peter Minium K      Created: Wed Jul 09, 2014  9:32 AM      Regarding: schedule                   ----- Message -----         From: Ulyses Amor, PA-C         Sent: 07/09/2014   8:08 AM           To: Vvs Charge Pool            CTA abdomin pelvis f/u 4 weeks with Dr. Scot [name] post EVAR ------

## 2014-07-11 NOTE — Telephone Encounter (Signed)
Spoke with patient to schedule appt, mailed letter, dpm

## 2014-07-14 NOTE — Discharge Summary (Signed)
[facility] Discharge Summary   Patient ID:  Philip [name] MRN: [id number] DOB/AGE: 1941-10-21 73 y.o.  Admit date: 07/08/2014 Discharge date: 07/14/2014 Date of Surgery: 07/08/2014 Surgeon: Surgeon(s): Angelia Mould, MD Conrad [place], MD  Admission Diagnosis: Abdominal aneurysm without mention of rupture   Discharge Diagnoses:  Abdominal aneurysm without mention of rupture   Secondary Diagnoses: Past Medical History  Diagnosis Date  . Hyperlipidemia   . CAD (coronary artery disease)     had a MI , s/p CABG  . Skin cancer     several , one of them was melanoma (sees derm routinely)  . OSA (obstructive sleep apnea)     started CPAP 12/09  . Peripheral vascular disease   . AAA (abdominal aortic aneurysm)   . Hypertension     takes Amlodipine,Metoprolol,and Lisinopril daily  . GERD (gastroesophageal reflux disease)     takes Omeprazole daily  . Myocardial infarction     several with last one being 2001(but never knew about any except 2001)  . Dysrhythmia     paroximal a fib  . Pneumonia     as a child  . Tingling     feet occasionally  . Joint pain   . Joint swelling   . History of colon polyps   . History of kidney stones   . Cataracts, bilateral     immature  . History of shingles   . Tinnitus     Procedure(s): ABDOMINAL AORTIC ENDOVASCULAR STENT GRAFT/ GORE  Discharged Condition: good  HPI: 73 y/o male who was found to have a pulsatile abdominal mass.  Via ultrasound he was found to have a 7 cm infrarenal abdominal aortic aneurysm. His CT scan showed some reversed taper of the neck of the aneurysm and also short common iliac arteries. Given these issues, he underwent an arteriogram in order to help determine if he is a candidate for EVAR.     Hospital Course:  Philip [name] is a 73 y.o. male is S/P Procedure(s): ABDOMINAL AORTIC ENDOVASCULAR STENT GRAFT/ GORE He was admitted post-op day one he was ambulating, taking PO's well  and voided.  He was discharged home in stable condition.     Significant Diagnostic Studies: CBC Lab Results  Component Value Date   WBC 8.0 07/09/2014   HGB 13.2 07/09/2014   HCT 38.7* 07/09/2014   MCV 88.0 07/09/2014   PLT 135* 07/09/2014    BMET    Component Value Date/Time   NA 137 07/09/2014 0700   K 4.5 07/09/2014 0700   CL 102 07/09/2014 0700   CO2 24 07/09/2014 0700   GLUCOSE 140* 07/09/2014 0700   BUN 21 07/09/2014 0700   CREATININE 0.95 07/09/2014 0700   CALCIUM 8.5 07/09/2014 0700   GFRNONAA 80* 07/09/2014 0700   GFRAA >90 07/09/2014 0700   COAG Lab Results  Component Value Date   INR 1.18 07/08/2014   INR 1.05 07/02/2014   INR 1.0 05/20/2014      Discharge Instructions   Call MD for:  redness, tenderness, or signs of infection (pain, swelling, bleeding, redness, odor or green/yellow discharge around incision site)    Complete by:  As directed      Call MD for:  severe or increased pain, loss or decreased feeling  in affected limb(s)    Complete by:  As directed      Call MD for:  temperature >100.5    Complete by:  As directed  Discharge instructions    Complete by:  As directed   Remove dressings after 24 hours and shower with soap and water.     Driving Restrictions    Complete by:  As directed   No driving for 2 weeks     Increase activity slowly    Complete by:  As directed   Walk with assistance use walker or cane as needed     Lifting restrictions    Complete by:  As directed   No lifting for 6 weeks     Resume previous diet    Complete by:  As directed             Medication List         amLODipine 10 MG tablet  Commonly known as:  NORVASC  Take 1 tablet (10 mg total) by mouth daily.     aspirin EC 81 MG tablet  Take 1 tablet (81 mg total) by mouth daily.     atorvastatin 40 MG tablet  Commonly known as:  LIPITOR  Take 1 tablet (40 mg total) by mouth daily.     B-COMPLEX PO  Take 1 tablet by mouth daily.     co-enzyme Q-10 30 MG capsule  Take 30  mg by mouth daily.     Fish Oil 1200 MG Caps  Take 1,200 mg by mouth daily.     lisinopril 20 MG tablet  Commonly known as:  PRINIVIL,ZESTRIL  Take 10 mg by mouth daily.     magnesium oxide 400 MG tablet  Commonly known as:  MAG-OX  Take 800 mg by mouth daily.     metoprolol succinate 100 MG 24 hr tablet  Commonly known as:  TOPROL-XL  Take 1 tablet (100 mg total) by mouth daily. Take with or immediately following a meal.     multivitamin with minerals tablet  Take 1 tablet by mouth daily.     omeprazole 20 MG tablet  Commonly known as:  PRILOSEC OTC  Take 20 mg by mouth daily.     oxyCODONE-acetaminophen 5-325 MG per tablet  Commonly known as:  PERCOCET/ROXICET  Take 1-2 tablets by mouth every 6 (six) hours as needed for moderate pain.     PROBIOTIC DAILY Caps  Take 1 capsule by mouth daily.     Vitamin D3 5000 UNITS Caps  Take 5,000 Units by mouth daily.       Verbal and written Discharge instructions given to the patient. Wound care per Discharge AVS     Follow-up Information   Follow up with DICKSON,CHRISTOPHER S, MD In 4 weeks. (sent message to office)    Specialty:  Vascular Surgery   Contact information:   [place] [address] [phone]       Signed: Laurence Slate [facility] 07/14/2014, 10:48 AM   Disposition:  Discharge to :Mineral use --- Instructions: Press F2 to tab through selections.  Delete question if not applicable.   Post-op:  Time to Extubation: [ ]  In OR, [ ]  < 12 hrs, [ ]  12-24 hrs, [ ]  >=24 hrs Vasopressors Req. Post-op: No MI: [x ] No, [ ]  Troponin only, [ ]  EKG or Clinical New Arrhythmia: No CHF: No ICU Stay: 1 days Transfusion: No  If yes, 0 units given  Complications: Resp failure: [x ] none, [ ]  Pneumonia, [ ]  Ventilator Chg in renal function: [x ] none, [ ]  Inc. Cr > 0.5, [ ]  Temp. Dialysis, [ ]   Permanent dialysis Leg ischemia: [x ] No, [ ]  Yes, no Surgery needed, [ ]  Yes, Surgery needed, [ ]   Amputation Bowel ischemia: [x ] No, [ ]  Medical Rx, [ ]  Surgical Rx Wound complication: [x ] No, [ ]  Superficial separation/infection, [ ]  Return to OR Return to OR: No  Return to OR for bleeding: No Stroke: [x ] None, [ ]  Minor, [ ]  Major  Discharge medications: Statin use:  Yes ASA use:  Yes Plavix use:  No  for medical reason   Beta blocker use:  Yes

## 2014-07-21 NOTE — Discharge Summary (Signed)
Agree with plans for D/C  Philip Mayo, MD, [facility] Beeper [phone] 07/21/2014

## 2014-08-01 ENCOUNTER — Other Ambulatory Visit: Payer: Self-pay | Admitting: *Deleted

## 2014-08-04 ENCOUNTER — Ambulatory Visit (INDEPENDENT_AMBULATORY_CARE_PROVIDER_SITE_OTHER): Payer: Commercial Managed Care - HMO | Admitting: Cardiology

## 2014-08-04 VITALS — BP 134/76 | HR 60 | Ht 69.0 in | Wt 234.0 lb

## 2014-08-04 DIAGNOSIS — I714 Abdominal aortic aneurysm, without rupture, unspecified: Secondary | ICD-10-CM

## 2014-08-04 DIAGNOSIS — I480 Paroxysmal atrial fibrillation: Secondary | ICD-10-CM

## 2014-08-04 DIAGNOSIS — E785 Hyperlipidemia, unspecified: Secondary | ICD-10-CM

## 2014-08-04 DIAGNOSIS — I10 Essential (primary) hypertension: Secondary | ICD-10-CM

## 2014-08-04 DIAGNOSIS — I2510 Atherosclerotic heart disease of native coronary artery without angina pectoris: Secondary | ICD-10-CM

## 2014-08-04 LAB — LIPID PANEL
Cholesterol: 138 mg/dL (ref 0–200)
HDL: 34.7 mg/dL — AB (ref >39.00)
NONHDL: 103.3
TRIGLYCERIDES: 321 mg/dL — AB (ref 0.0–149.0)
Total CHOL/HDL Ratio: 4
VLDL: 64.2 mg/dL — ABNORMAL HIGH (ref 0.0–40.0)

## 2014-08-04 LAB — LDL CHOLESTEROL, DIRECT: Direct LDL: 63.1 mg/dL

## 2014-08-04 MED ORDER — APIXABAN 5 MG PO TABS: 5.0000 mg | ORAL_TABLET | Freq: Two times a day (BID) | ORAL | Status: DC

## 2014-08-04 NOTE — Patient Instructions (Addendum)
If OK after you see [name]:  Stop aspirin.   Start Eliquis 5mg  two times a day.  If this is too expensive call the office and Dr [name] Dubin will prescribe Xarelto.   Your physician recommends that you have a lipid profile today.  Your physician recommends that you return for lab work in: 3 months--CBCd/BMET.  Your physician wants you to follow-up in: 6 months with Dr [name] Dubin. (April 2016). You will receive a reminder letter in the mail two months in advance. If you don't receive a letter, please call our office to schedule the follow-up appointment.

## 2014-08-05 ENCOUNTER — Encounter: Payer: Self-pay | Admitting: Cardiology

## 2014-08-05 ENCOUNTER — Encounter: Payer: Self-pay | Admitting: Vascular Surgery

## 2014-08-05 NOTE — Progress Notes (Signed)
Patient ID: Philip [name], male   DOB: 1941-08-24, 73 y.o.   MRN: [id number]  PCP: Dr. Larose Kells  73 yo with history of CAD s/p CABG in 1999 presents for cardiology followup.  Recently, a number of issues have developed.  He noted a prominent abdominal pulsation when using his inversion table and told his PCP about this.  Therefore, he had an abdominal ultrasound with a 7 cm AAA noted. He was seen by [name] for discussion of repair and then referred back to this office for pre-operative evaluation.  He was seen by the PA in this office, and at that appointment was noted to be in atrial fibrillation with controlled rate.  He had not noticed any palpitations or change in his baseline symptoms. He was not started on anticoagulation given plan for AAA surgery in the near future.  At subsequent appointments (including today), he has been in NSR.  He was sent for a pre-op Lexiscan Cardiolite in 7/15. This study was intermediate risk, showing a reversible anterior and apical perfusion defect and a fixed basal to mid inferior defect with EF 48%.  Echo was done to reassess EF.  This showed EF 55-60%.  I did LHC in 7/15 given abnormal Cardiolite.  This showed patent grafts.  In 9/92, he had uncomplicated endovascular repair of his AAA.   Currently, he is doing well.  No exertional dyspnea or chest pain.  He is in NSR today and has had no palpitations.   ECG (05/07/14): Atrial fibrillation with PVCs ECG (05/20/14): NSR, RAE ECG (06/18/14): NSR, RAE, IVCD ECG (08/04/14): NSR, LVH, old inferior MI  Labs (11/13): K 4.5, creatinine 1.1, LDL 74, HDL 36 Labs (8/14): K 4.4, creatinine 1.2, LDL 53, HDL 36 Labs (1/15): LDL 54, HDL 37 Labs (6/15): K 4.6, creatinine 1.6 Labs (8/15): K 3.9, creatinine 1.1 Labs (9/15): K 4.5, creatinine 0.95, HCT 38.7  PMH: 1. Hyperlipidemia 2. HTN 3. CAD s/p CABG 1999 with LIMA-LAD, SVG-LCx, SVG-RCA.  [place] 2009 with 3/3 grafts patent. Lexiscan Cardiolite (7/15) with EF 48%, reversible  anterior and apical perfusion defect and a fixed basal to mid inferior defect.  Echo (7/15) with EF 55-60%, mild LAE.  LHC (7/15) with total occlusion of LM and RCA; SVG-OM patent, SVG-RCA patent, LIMA-LAD patent.  4. H/o melanoma 5. OSA on CPAP 6. AAA: s/p endovascular repair in 9/15.  7. Atrial fibrillation: Paroxysmal, 1st noted in 7/15.  8. CKD  SH: Retired, married, 3 kids, quit smoking in 1978.  Lives in [place].   FH: Mother with CAD.   ROS: All systems reviewed and negative except as per HPI.   Current Outpatient Prescriptions  Medication Sig Dispense Refill  . amLODipine (NORVASC) 10 MG tablet Take 1 tablet (10 mg total) by mouth daily.  90 tablet  3  . atorvastatin (LIPITOR) 40 MG tablet Take 1 tablet (40 mg total) by mouth daily.  90 tablet  1  . B Complex-Biotin-FA (B-COMPLEX PO) Take 1 tablet by mouth daily.      . Cholecalciferol (VITAMIN D3) 5000 UNITS CAPS Take 5,000 Units by mouth daily.      Marland Kitchen co-enzyme Q-10 30 MG capsule Take 30 mg by mouth daily.      Marland Kitchen lisinopril (PRINIVIL,ZESTRIL) 20 MG tablet Take 10 mg by mouth daily.      . magnesium oxide (MAG-OX) 400 MG tablet Take 800 mg by mouth daily.      . metoprolol succinate (TOPROL-XL) 100 MG 24 hr tablet  Take 1 tablet (100 mg total) by mouth daily. Take with or immediately following a meal.  90 tablet  3  . Multiple Vitamins-Minerals (MULTIVITAMIN WITH MINERALS) tablet Take 1 tablet by mouth daily.      . Omega-3 Fatty Acids (FISH OIL) 1200 MG CAPS Take 1,200 mg by mouth daily.      Marland Kitchen omeprazole (PRILOSEC OTC) 20 MG tablet Take 20 mg by mouth daily.      . Probiotic Product (PROBIOTIC DAILY) CAPS Take 1 capsule by mouth daily.      Marland Kitchen apixaban (ELIQUIS) 5 MG TABS tablet Take 1 tablet (5 mg total) by mouth 2 (two) times daily.  60 tablet  6  . oxyCODONE-acetaminophen (PERCOCET/ROXICET) 5-325 MG per tablet Take 1-2 tablets by mouth every 6 (six) hours as needed for moderate pain.  30 tablet  0   No current  facility-administered medications for this visit.    BP 134/76  Pulse 60  Ht 5\' 9"  (1.753 m)  Wt 234 lb (106.142 kg)  BMI 34.54 kg/m2 General: NAD Neck: No JVD, no thyromegaly or thyroid nodule.  Lungs: Clear to auscultation bilaterally with normal respiratory effort. CV: Nondisplaced PMI.  Heart regular S1/S2, no S3/S4, no murmur.  No edema.  No carotid bruit.  Normal pedal pulses.  Abdomen: Soft, nontender, no hepatosplenomegaly, no distention.  Skin: Intact without lesions or rashes.  Neurologic: Alert and oriented x 3.  Psych: Normal affect. Extremities: No clubbing or cyanosis.   Assessment/Plan 1. CAD: s/p CABG in 1999. He was set up for [employer] prior to vascular surgery (AAA repair) and was found to have an intermediate risk study with anterior and apical ischemia.  LHC in 7/15 showed patent grafts, no intervention needed.  He has no ischemic symptoms.  - Continue current regimen of ASA 81, statin, ACEI, and Toprol XL.  2. HTN: BP is under good control.  3. Hyperlipidemia: Continue atorvastatin, check lipids today.  4. AAA: s/p uncomplicated endovascular repair in 9/15.   5. CKD: Improved creatinine recently. 6. Atrial fibrillation: Paroxysmal.  He is in NSR today.  CHADSVASC = 3.  I would like him to go on a NOAC.  He will see Dr. Scot [name] and will have CTA abdomen to look for endoleak later this week.  If this is ok, I will have him start Eliquis 5 mg bid and stop ASA.  He will need BMET and CBC in 3 months.   [name] 08/05/2014

## 2014-08-06 ENCOUNTER — Ambulatory Visit
Admission: RE | Admit: 2014-08-06 | Discharge: 2014-08-06 | Disposition: A | Discharge status: Home / Self Care | Payer: Commercial Managed Care - HMO | Source: Ambulatory Visit | Attending: Vascular Surgery | Admitting: Vascular Surgery

## 2014-08-06 ENCOUNTER — Ambulatory Visit (INDEPENDENT_AMBULATORY_CARE_PROVIDER_SITE_OTHER): Payer: Commercial Managed Care - HMO | Admitting: Vascular Surgery

## 2014-08-06 ENCOUNTER — Encounter: Payer: Self-pay | Admitting: Vascular Surgery

## 2014-08-06 VITALS — BP 108/73 | HR 62 | Ht 69.0 in | Wt 234.5 lb

## 2014-08-06 DIAGNOSIS — I714 Abdominal aortic aneurysm, without rupture, unspecified: Secondary | ICD-10-CM

## 2014-08-06 DIAGNOSIS — Z48812 Encounter for surgical aftercare following surgery on the circulatory system: Secondary | ICD-10-CM

## 2014-08-06 MED ORDER — IOHEXOL 350 MG/ML SOLN
80.0000 mL | Freq: Once | INTRAVENOUS | Status: AC | PRN
  Administered 2014-08-06: 80 mL via INTRAVENOUS

## 2014-08-06 NOTE — Assessment & Plan Note (Signed)
The patient is doing well status post EVAR. 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 For this reason I have ordered a follow up CT scan in 6 months and I'll see him back at that time. I recommended that he not use his in version table.  The patient is on a statin. I have given him the okay to gradually resume his normal activities.

## 2014-08-06 NOTE — Progress Notes (Signed)
   Patient name: Philip [name] MRN: [id number] DOB: 10-Mar-1941 Sex: male  REASON FOR VISIT: Follow up after the PEVAR  HPI: Philip [name] is a 73 y.o. male who presented with a 7 cm infrarenal abdominal aortic aneurysm. He underwent percutaneous endovascular aneurysm repair using 4 components. He had technically challenging anatomy but ultimately had a good result with no evidence of type I endoleak. Since he has been home he has been doing well. He did use his inversion table which concerns me somewhat. He denies any significant abdominal pain or back pain.  REVIEW OF SYSTEMS: Valu.Nieves ] denotes positive finding; [  ] denotes negative finding  CARDIOVASCULAR:  [ ]  chest pain   [ ]  dyspnea on exertion    CONSTITUTIONAL:  [ ]  fever   [ ]  chills  PHYSICAL EXAM: Filed Vitals:   08/06/14 1157  BP: 108/73  Pulse: 62  Height: 5\' 9"  (1.753 m)  Weight: 234 lb 8 oz (106.369 kg)  SpO2: 96%   Body mass index is 34.61 kg/(m^2). GENERAL: The patient is a well-nourished male, in no acute distress. The vital signs are documented above. CARDIOVASCULAR: There is a regular rate and rhythm. PULMONARY: There is good air exchange bilaterally without wheezing or rales. He has palpable femoral pulses with no evidence of groin hematoma. He has bilateral lower extremity swelling.  I did discuss his CT today with Dr. Kathlene Cote. The aneurysm is stable in size or slightly smaller. There is evidence of type II endoleak but no evidence of type I endoleak.  MEDICAL ISSUES:  AAA (abdominal aortic aneurysm) without rupture The patient is doing well status post EVAR. 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 For this reason I have ordered a follow up CT scan in 6 months and I'll see him back at that time. I recommended that he not use his in version table.  The patient is on a statin. I have given him the okay to gradually resume his normal activities.   [place] [facility] [place] Beeper:  [phone]

## 2014-08-07 ENCOUNTER — Other Ambulatory Visit: Payer: Self-pay | Admitting: *Deleted

## 2014-08-07 ENCOUNTER — Telehealth: Payer: Self-pay | Admitting: *Deleted

## 2014-08-07 ENCOUNTER — Encounter: Payer: Commercial Managed Care - HMO | Admitting: Vascular Surgery

## 2014-08-07 DIAGNOSIS — I714 Abdominal aortic aneurysm, without rupture, unspecified: Secondary | ICD-10-CM

## 2014-08-07 MED ORDER — OMEPRAZOLE 20 MG PO CPDR: 20.0000 mg | DELAYED_RELEASE_CAPSULE | Freq: Every day | ORAL | Status: DC

## 2014-08-07 NOTE — Telephone Encounter (Signed)
Please let Philip [name] know that he is ok to start Eliquis and stop ASA. ----- Message ----- From: Angelia Mould, MD Sent: 08/06/2014 5:07 PM To: [name], MD Yes, that would be fine. Thanks Dalton ----- Message ----- From: [name], MD Sent: 08/06/2014 2:12 PM To: Angelia Mould, MD Gerald Stabs: Would Philip [name] be ok to start anticoagulation (Eliquis) for his paroxysmal atrial fibrillation? I see that he had an endoleak on CT. Dalton

## 2014-08-21 ENCOUNTER — Other Ambulatory Visit: Payer: Self-pay | Admitting: Cardiology

## 2014-08-27 ENCOUNTER — Telehealth: Payer: Self-pay | Admitting: Cardiology

## 2014-08-27 NOTE — Telephone Encounter (Signed)
New message      Is the pt ok to have a colonscopy in December and stop eliquis for 2 days prior?

## 2014-08-27 NOTE — Telephone Encounter (Signed)
ok 

## 2014-08-27 NOTE — Telephone Encounter (Signed)
Will forward to Dr McLean 

## 2014-08-28 NOTE — Telephone Encounter (Signed)
[name] not in office yet, [name] given Dr Claris Gladden recommendations. Will fax to Dr Michail Sermon.

## 2014-09-15 ENCOUNTER — Ambulatory Visit (INDEPENDENT_AMBULATORY_CARE_PROVIDER_SITE_OTHER): Payer: Commercial Managed Care - HMO | Admitting: Internal Medicine

## 2014-09-15 ENCOUNTER — Encounter: Payer: Self-pay | Admitting: Internal Medicine

## 2014-09-15 ENCOUNTER — Other Ambulatory Visit: Payer: Self-pay

## 2014-09-15 VITALS — BP 144/73 | HR 49 | Temp 97.6°F | Wt 241.2 lb

## 2014-09-15 DIAGNOSIS — K1120 Sialoadenitis, unspecified: Secondary | ICD-10-CM

## 2014-09-15 DIAGNOSIS — I10 Essential (primary) hypertension: Secondary | ICD-10-CM

## 2014-09-15 MED ORDER — AMOXICILLIN-POT CLAVULANATE 875-125 MG PO TABS: 1.0000 | ORAL_TABLET | Freq: Two times a day (BID) | ORAL | Status: DC

## 2014-09-15 NOTE — Assessment & Plan Note (Signed)
Hypertension, creat was a slightly elevated, ACE inhibitor dose decreased, kidney  function better, BP well-controlled

## 2014-09-15 NOTE — Patient Instructions (Signed)
Warm compress twice a day Take antibiotics as prescribed Tylenol as needed Suck on a lime or lemon  twice a day Call if not gradually improving in the next few days. Call anytime if you have severe symptoms, swelling, fever     Parotitis Parotitis is soreness and inflammation of one or both parotid glands. The parotid glands produce saliva. They are located on each side of the face, below and in front of the earlobes. The saliva produced comes out of tiny openings (ducts) inside the cheeks. In most cases, parotitis goes away over time or with treatment. If your parotitis is caused by certain long-term (chronic) diseases, it may come back again.  CAUSES  Parotitis can be caused by:  Viral infections. Mumps is one viral infection that can cause parotitis.  Bacterial infections.  Blockage of the salivary ducts due to a salivary stone.  Narrowing of the salivary ducts.  Swelling of the salivary ducts.  Dehydration.  Autoimmune conditions, such as sarcoidosis or Sjogren syndrome.  Air from activities such as scuba diving, glass blowing, or playing an instrument (rare).  Human immunodeficiency virus (HIV) or acquired immunodeficiency syndrome (AIDS).  Tuberculosis. SIGNS AND SYMPTOMS   The ears may appear to be pushed up and out from their normal position.  Redness (erythema) of the skin over the parotid glands.  Pain and tenderness over the parotid glands.  Swelling in the parotid gland area.  Yellowish-white fluid (pus) coming from the ducts inside the cheeks.  Dry mouth.  Bad taste in the mouth. DIAGNOSIS  Your health care provider may determine that you have parotitis based on your symptoms and a physical exam. A sample of fluid may also be taken from the parotid gland and tested to find the cause of your infection. X-rays or computed tomography (CT) scans may be taken if your health care provider thinks you might have a salivary stone blocking your salivary  duct. TREATMENT  Treatment varies depending upon the cause of your parotitis. If your parotitis is caused by mumps, no treatment is needed. The condition will go away on its own after 7 to 10 days. In other cases, treatment may include:  Antibiotic medicine if your infection was caused by bacteria.  Pain medicines.  Gland massage.  Eating sour candy to increase your saliva production.  Removal of salivary stones. Your health care provider may flush stones out with fluids or remove them with tweezers.  Surgery to remove the parotid glands. HOME CARE INSTRUCTIONS   If you were prescribed an antibiotic medicine, finish it all even if you start to feel better.  Put warm compresses on the sore area.  Take medicines only as directed by your health care provider.  Drink enough fluids to keep your urine clear or pale yellow. SEEK IMMEDIATE MEDICAL CARE IF:   You have increasing pain or swelling that is not controlled with medicine.  You have a fever. MAKE SURE YOU:  Understand these instructions.  Will watch your condition.  Will get help right away if you are not doing well or get worse. Document Released: 04/08/2002 Document Revised: 03/03/2014 Document Reviewed: 09/12/2011 [facility] Patient Information 2015 [place], [place]. This information is not intended to replace advice given to you by your health care provider. Make sure you discuss any questions you have with your health care provider.

## 2014-09-15 NOTE — Progress Notes (Signed)
Pre visit review using our clinic review tool, if applicable. No additional management support is needed unless otherwise documented below in the visit note. 

## 2014-09-15 NOTE — Progress Notes (Signed)
Subjective:    Patient ID: Philip [name], male    DOB: 03-Jan-1941, 73 y.o.   MRN: [id number]  DOS:  09/15/2014 Type of visit - description : acute Interval history: Symptoms started 2 days ago w/ pain at the left ear and around it . Pain was more consistent this morning . Denies any fever or chills. No increased pain with chewing. No swelling. Denies any dental pain.   ROS See history of present illness Denies fever, chills or unexplained weight loss   Past Medical History  Diagnosis Date  . Hyperlipidemia   . CAD (coronary artery disease)     had a MI , s/p CABG  . Skin cancer     several , one of them was melanoma (sees derm routinely)  . OSA (obstructive sleep apnea)     started CPAP 12/09  . Peripheral vascular disease   . AAA (abdominal aortic aneurysm)   . Hypertension     takes Amlodipine,Metoprolol,and Lisinopril daily  . GERD (gastroesophageal reflux disease)     takes Omeprazole daily  . Myocardial infarction     several with last one being 2001(but never knew about any except 2001)  . Dysrhythmia     paroximal a fib  . Pneumonia     as a child  . Tingling     feet occasionally  . Joint pain   . Joint swelling   . History of colon polyps   . History of kidney stones   . Cataracts, bilateral     immature  . History of shingles   . Tinnitus     Past Surgical History  Procedure Laterality Date  . Penile prosthesis implant  08/2005  . Rhinoplasty  1975  . Coronary artery bypass graft  1999    x 3 vessels  . Cardiac catheterization  2015  . Colonoscopy    . Abdominal aortic endovascular stent graft N/A 07/08/2014    Procedure: ABDOMINAL AORTIC ENDOVASCULAR STENT GRAFT/ GORE;  Surgeon: Angelia Mould, MD;  Location: [facility];  Service: Vascular;  Laterality: N/A;    History   Social History  . Marital Status: Married    Spouse Name: N/A    Number of Children: 3  . Years of Education: N/A   Occupational History  . retired, Merchandiser, retail    Social History Main Topics  . Smoking status: Former Research scientist (life sciences)  . Smokeless tobacco: Never Used     Comment: quit smoking in 1979  . Alcohol Use: Yes     Comment: occasionally  . Drug Use: No  . Sexual Activity: Not Currently   Other Topics Concern  . Not on file   Social History Narrative   1 son, 2 step children        Medication List       This list is accurate as of: 09/15/14  5:42 PM.  Always use your most recent med list.               amLODipine 10 MG tablet  Commonly known as:  NORVASC  Take 1 tablet (10 mg total) by mouth daily.     amoxicillin-clavulanate 875-125 MG per tablet  Commonly known as:  AUGMENTIN  Take 1 tablet by mouth 2 (two) times daily.     apixaban 5 MG Tabs tablet  Commonly known as:  ELIQUIS  Take 1 tablet (5 mg total) by mouth 2 (two) times daily.     atorvastatin 40 MG tablet  Commonly known as:  LIPITOR  TAKE 1 TABLET EVERY DAY     B-COMPLEX PO  Take 1 tablet by mouth daily.     co-enzyme Q-10 30 MG capsule  Take 30 mg by mouth daily.     Fish Oil 1200 MG Caps  Take 1,200 mg by mouth daily.     lisinopril 20 MG tablet  Commonly known as:  PRINIVIL,ZESTRIL  Take 10 mg by mouth daily.     magnesium oxide 400 MG tablet  Commonly known as:  MAG-OX  Take 800 mg by mouth daily.     metoprolol succinate 100 MG 24 hr tablet  Commonly known as:  TOPROL-XL  Take 1 tablet (100 mg total) by mouth daily. Take with or immediately following a meal.     multivitamin with minerals tablet  Take 1 tablet by mouth daily.     omeprazole 20 MG capsule  Commonly known as:  PRILOSEC  Take 1 capsule (20 mg total) by mouth daily.     oxyCODONE-acetaminophen 5-325 MG per tablet  Commonly known as:  PERCOCET/ROXICET  Take 1-2 tablets by mouth every 6 (six) hours as needed for moderate pain.     PROBIOTIC DAILY Caps  Take 1 capsule by mouth daily.     Vitamin D3 5000 UNITS Caps  Take 5,000 Units by mouth daily.             Objective:   Physical Exam  HENT:  Head:     BP 144/73 mmHg  Pulse 49  Temp(Src) 97.6 F (36.4 C) (Oral)  Wt 241 lb 4 oz (109.43 kg)  SpO2 97%  General -- alert, well-developed, NAD.  Neck --no TTP C spine  HEENT--  No TTP at either temple  R Ear-- wax L ear-- canal normal without redness or swelling TMJs: No TTP, no click Throat symmetric, no redness or discharge. Face symmetric, sinuses not tender to palpation. Nose not congested. Oral cavity: Gums without swelling or tenderness, percussion normal Neurologic--  alert & oriented X3. Speech normal, gait appropriate for age, strength symmetric and appropriate for age.  Psych-- Cognition and judgment appear intact. Cooperative with normal attention span and concentration. No anxious or depressed appearing.       Assessment & Plan:    Suspect parotitis on clinical grounds. No evidence of TMJ, dental infection or ear infection. Plan: Treat for parotitis, call if not improving. See instructions.  Also, the patient is doing well after a aortic aneurysm endovascular repair.

## 2014-09-16 ENCOUNTER — Ambulatory Visit: Payer: Commercial Managed Care - HMO | Admitting: Internal Medicine

## 2014-10-02 LAB — HM COLONOSCOPY

## 2014-10-09 ENCOUNTER — Encounter (HOSPITAL_COMMUNITY): Payer: Self-pay | Admitting: Cardiology

## 2014-10-27 ENCOUNTER — Other Ambulatory Visit: Payer: Self-pay

## 2014-10-27 MED ORDER — LISINOPRIL 20 MG PO TABS: 10.0000 mg | ORAL_TABLET | Freq: Every day | ORAL | Status: DC

## 2014-11-03 ENCOUNTER — Other Ambulatory Visit (INDEPENDENT_AMBULATORY_CARE_PROVIDER_SITE_OTHER): Payer: Commercial Managed Care - HMO | Admitting: *Deleted

## 2014-11-03 DIAGNOSIS — E785 Hyperlipidemia, unspecified: Secondary | ICD-10-CM | POA: Diagnosis not present

## 2014-11-03 DIAGNOSIS — I10 Essential (primary) hypertension: Secondary | ICD-10-CM

## 2014-11-03 DIAGNOSIS — I714 Abdominal aortic aneurysm, without rupture, unspecified: Secondary | ICD-10-CM

## 2014-11-03 DIAGNOSIS — I480 Paroxysmal atrial fibrillation: Secondary | ICD-10-CM

## 2014-11-03 DIAGNOSIS — I2510 Atherosclerotic heart disease of native coronary artery without angina pectoris: Secondary | ICD-10-CM

## 2014-11-03 LAB — CBC WITH DIFFERENTIAL/PLATELET
BASOS PCT: 0.9 % (ref 0.0–3.0)
Basophils Absolute: 0.1 10*3/uL (ref 0.0–0.1)
EOS ABS: 0.1 10*3/uL (ref 0.0–0.7)
Eosinophils Relative: 2.2 % (ref 0.0–5.0)
HCT: 45.2 % (ref 39.0–52.0)
HEMOGLOBIN: 14.7 g/dL (ref 13.0–17.0)
LYMPHS PCT: 37.3 % (ref 12.0–46.0)
Lymphs Abs: 2.2 10*3/uL (ref 0.7–4.0)
MCHC: 32.6 g/dL (ref 30.0–36.0)
MCV: 91.1 fl (ref 78.0–100.0)
MONO ABS: 0.6 10*3/uL (ref 0.1–1.0)
Monocytes Relative: 10.4 % (ref 3.0–12.0)
NEUTROS ABS: 2.9 10*3/uL (ref 1.4–7.7)
Neutrophils Relative %: 49.2 % (ref 43.0–77.0)
Platelets: 149 10*3/uL — ABNORMAL LOW (ref 150.0–400.0)
RBC: 4.96 Mil/uL (ref 4.22–5.81)
RDW: 14.5 % (ref 11.5–15.5)
WBC: 5.9 10*3/uL (ref 4.0–10.5)

## 2014-11-03 LAB — BASIC METABOLIC PANEL
BUN: 19 mg/dL (ref 6–23)
CALCIUM: 9.2 mg/dL (ref 8.4–10.5)
CO2: 28 meq/L (ref 19–32)
CREATININE: 1.2 mg/dL (ref 0.4–1.5)
Chloride: 105 mEq/L (ref 96–112)
GFR: 63.44 mL/min (ref >60.00)
Glucose, Bld: 101 mg/dL — ABNORMAL HIGH (ref 70–99)
Potassium: 4.5 mEq/L (ref 3.5–5.1)
Sodium: 140 mEq/L (ref 135–145)

## 2014-11-28 ENCOUNTER — Telehealth: Payer: Self-pay | Admitting: Internal Medicine

## 2014-11-28 DIAGNOSIS — Z0100 Encounter for examination of eyes and vision without abnormal findings: Secondary | ICD-10-CM

## 2014-11-28 NOTE — Telephone Encounter (Signed)
Caller name: Chinmay, Squier Relation to pt: self  Call back number: [phone]   Reason for call:  Pt requesting a referral for Dr. Burt Knack [facility], [place], [place] [address] [phone].   Pt has appointment July 29th, 2016, pt has [facility]

## 2014-11-28 NOTE — Telephone Encounter (Signed)
Referral placed to Dr. Burt Knack at [facility] as requested.

## 2014-12-07 ENCOUNTER — Other Ambulatory Visit: Payer: Self-pay

## 2014-12-07 MED ORDER — AMLODIPINE BESYLATE 10 MG PO TABS: 10.0000 mg | ORAL_TABLET | Freq: Every day | ORAL | Status: DC

## 2014-12-24 ENCOUNTER — Encounter: Payer: Self-pay | Admitting: Pulmonary Disease

## 2014-12-24 ENCOUNTER — Ambulatory Visit (INDEPENDENT_AMBULATORY_CARE_PROVIDER_SITE_OTHER): Payer: Commercial Managed Care - HMO | Admitting: Pulmonary Disease

## 2014-12-24 VITALS — BP 140/90 | HR 64 | Ht 69.0 in | Wt 247.2 lb

## 2014-12-24 DIAGNOSIS — G4733 Obstructive sleep apnea (adult) (pediatric): Secondary | ICD-10-CM | POA: Diagnosis not present

## 2014-12-24 NOTE — Patient Instructions (Signed)
CPAP supplies will be renewed for a year We will check report on your machine

## 2014-12-24 NOTE — Progress Notes (Signed)
Subjective:    Patient ID: Philip [name], male    DOB: November 25, 1940, 74 y.o.   MRN: [id number]  HPI   Chief Complaint  Patient presents with  . Sleep Consult    Former [facility] patient; had Sleep Study done 08/26/2008; Wearing CPAP every night; 10cwp; mask fitting fine.  Needs updated mask, water reservoir.  Epworth score: 4   74 year old presents for management of OSA. PSG 07/2008 showed severe OSA with AHI 33 per hour lowest desaturation of 84% and PLM's. This was corrected by CPAP of 10 cm, I did not see a formal C Pap titration study. He preferred a nasal mask. Since then he is obtained supplies online but now would like to switch to DME. He claims good compliance with his C Pap. And denies excessive daytime somnolence or fatigue. Epworth sleepiness score is 2 Bedtime is around 11 PM, sleep latency is minimal, sleeps on his back with a contoured pillow, reports one nocturnal awakening without post void sleep latency and is out of bed by 7 AM feeling rested without headaches or dryness of mouth. Wife has not noted snoring on C Pap. He is gained 25 pounds since his last study  Past Medical History  Diagnosis Date  . Hyperlipidemia   . CAD (coronary artery disease)     had a MI , s/p CABG  . Skin cancer     several , one of them was melanoma (sees derm routinely)  . OSA (obstructive sleep apnea)     started CPAP 12/09  . Peripheral vascular disease   . AAA (abdominal aortic aneurysm)   . Hypertension     takes Amlodipine,Metoprolol,and Lisinopril daily  . GERD (gastroesophageal reflux disease)     takes Omeprazole daily  . Myocardial infarction     several with last one being 2001(but never knew about any except 2001)  . Dysrhythmia     paroximal a fib  . Pneumonia     as a child  . Tingling     feet occasionally  . Joint pain   . Joint swelling   . History of colon polyps   . History of kidney stones   . Cataracts, bilateral     immature  . History of shingles   .  Tinnitus     Past Surgical History  Procedure Laterality Date  . Penile prosthesis implant  08/2005  . Rhinoplasty  1975  . Coronary artery bypass graft  1999    x 3 vessels  . Cardiac catheterization  2015  . Colonoscopy    . Abdominal aortic endovascular stent graft N/A 07/08/2014    Procedure: ABDOMINAL AORTIC ENDOVASCULAR STENT GRAFT/ GORE;  Surgeon: Angelia Mould, MD;  Location: [facility];  Service: Vascular;  Laterality: N/A;  . Left heart catheterization with coronary/graft angiogram N/A 05/29/2014    Procedure: LEFT HEART CATHETERIZATION WITH [name];  Surgeon: [name], MD;  Location: [facility] CATH LAB;  Service: Cardiovascular;  Laterality: N/A;  . Abdominal aortagram N/A 06/16/2014    Procedure: ABDOMINAL AORTAGRAM;  Surgeon: Angelia Mould, MD;  Location: [facility] CATH LAB;  Service: Cardiovascular;  Laterality: N/A;   No Known Allergies  History   Social History  . Marital Status: Married    Spouse Name: N/A  . Number of Children: 3  . Years of Education: N/A   Occupational History  . retired, Actor    Social History Main Topics  . Smoking status: Former Research scientist (life sciences)  .  Smokeless tobacco: Never Used     Comment: quit smoking in 1979  . Alcohol Use: Yes     Comment: occasionally  . Drug Use: No  . Sexual Activity: Not Currently   Other Topics Concern  . Not on file   Social History Narrative   1 son, 2 step children    Family History  Problem Relation Age of Onset  . Sleep apnea Brother   . Colon cancer Other     aunt  . Prostate cancer Father 59  . Heart disease Mother   . Skin cancer Mother   . Diabetes Neg Hx   . Stroke Neg Hx   . Heart disease Father   . Skin cancer Father   . Skin cancer Brother   . Skin cancer Sister   . Heart attack Mother   . Heart attack Father      Review of Systems neg for any significant sore throat, dysphagia, itching, sneezing, nasal congestion or excess/ purulent secretions, fever,  chills, sweats, unintended wt loss, pleuritic or exertional cp, hempoptysis, orthopnea pnd or change in chronic leg swelling. Also denies presyncope, palpitations, heartburn, abdominal pain, nausea, vomiting, diarrhea or change in bowel or urinary habits, dysuria,hematuria, rash, arthralgias, visual complaints, headache, numbness weakness or ataxia.     Objective:   Physical Exam  Gen. Pleasant, obese, in no distress, normal affect ENT - no lesions, no post nasal drip, class 2-3 airway Neck: No JVD, no thyromegaly, no carotid bruits Lungs: no use of accessory muscles, no dullness to percussion, decreased without rales or rhonchi  Cardiovascular: Rhythm regular, heart sounds  normal, no murmurs or gallops, no peripheral edema Abdomen: soft and non-tender, no hepatosplenomegaly, BS normal. Musculoskeletal: No deformities, no cyanosis or clubbing Neuro:  alert, non focal, no tremors        Assessment & Plan:

## 2014-12-24 NOTE — Assessment & Plan Note (Signed)
CPAP supplies will be renewed for a year We will check report on your machine  Weight loss encouraged, compliance with goal of at least 4-6 hrs every night is the expectation. Advised against medications with sedative side effects Cautioned against driving when sleepy - understanding that sleepiness will vary on a day to day basis

## 2014-12-26 ENCOUNTER — Telehealth: Payer: Self-pay | Admitting: Pulmonary Disease

## 2014-12-26 DIAGNOSIS — G4733 Obstructive sleep apnea (adult) (pediatric): Secondary | ICD-10-CM

## 2014-12-26 NOTE — Telephone Encounter (Signed)
Pt states that [facility] is no longer in contract with [employer]. He will need a new home care company. Order will be placed to change his CPAP and supplies to [facility].

## 2014-12-26 NOTE — Telephone Encounter (Signed)
lmtcb X1 for pt  

## 2014-12-26 NOTE — Telephone Encounter (Signed)
Pt returning call.Philip [name] ° °

## 2014-12-31 ENCOUNTER — Telehealth: Payer: Self-pay | Admitting: Pulmonary Disease

## 2014-12-31 NOTE — Telephone Encounter (Signed)
Spoke with pt. He still has not received his CPAP supplies that we ordered on 12/26/14. Order was sent to [place] by [facility]. Called [facility] and spoke with Arbie Cookey. She located pt's order, she is going to get this taken care of . Advised pt of this, he will call back if he has further issues.

## 2015-01-02 ENCOUNTER — Institutional Professional Consult (permissible substitution): Payer: Commercial Managed Care - HMO | Admitting: Pulmonary Disease

## 2015-01-02 ENCOUNTER — Telehealth: Payer: Self-pay | Admitting: Pulmonary Disease

## 2015-01-02 NOTE — Telephone Encounter (Signed)
Called and spoke to pt. Informed pt of the status of the order. Pt verbalized understanding and denied any further questions or concerns at this time.

## 2015-01-02 NOTE — Telephone Encounter (Signed)
Pt returning call.Stanley A Dalton ° °

## 2015-01-02 NOTE — Telephone Encounter (Signed)
Philip [name], spoke with Arbie Cookey Aware that order was sent 12/26/14 and the patient has not heard from them Arbie Cookey states that this is in the hands of the transition team and can take 7-10 business days to complete the process. Once complete and ran through insurance, patient will be contacted to set up with equipment.  LMTCB x 1 for pt

## 2015-01-05 DIAGNOSIS — Z0000 Encounter for general adult medical examination without abnormal findings: Secondary | ICD-10-CM | POA: Diagnosis not present

## 2015-01-05 DIAGNOSIS — M79609 Pain in unspecified limb: Secondary | ICD-10-CM | POA: Diagnosis not present

## 2015-01-05 DIAGNOSIS — Z85828 Personal history of other malignant neoplasm of skin: Secondary | ICD-10-CM | POA: Diagnosis not present

## 2015-01-05 DIAGNOSIS — G4733 Obstructive sleep apnea (adult) (pediatric): Secondary | ICD-10-CM | POA: Diagnosis not present

## 2015-01-16 ENCOUNTER — Other Ambulatory Visit: Payer: Self-pay | Admitting: Cardiology

## 2015-02-03 ENCOUNTER — Other Ambulatory Visit: Payer: Self-pay | Admitting: *Deleted

## 2015-02-03 DIAGNOSIS — Z01812 Encounter for preprocedural laboratory examination: Secondary | ICD-10-CM

## 2015-02-03 DIAGNOSIS — I739 Peripheral vascular disease, unspecified: Secondary | ICD-10-CM

## 2015-02-03 LAB — BUN: BUN: 20 mg/dL (ref 6–23)

## 2015-02-03 LAB — CREATININE, SERUM: Creat: 1.2 mg/dL (ref 0.50–1.35)

## 2015-02-04 ENCOUNTER — Ambulatory Visit
Admission: RE | Admit: 2015-02-04 | Discharge: 2015-02-04 | Disposition: A | Discharge status: Home / Self Care | Payer: Commercial Managed Care - HMO | Source: Ambulatory Visit | Attending: Vascular Surgery | Admitting: Vascular Surgery

## 2015-02-04 ENCOUNTER — Ambulatory Visit: Payer: Commercial Managed Care - HMO | Admitting: Vascular Surgery

## 2015-02-04 DIAGNOSIS — I714 Abdominal aortic aneurysm, without rupture, unspecified: Secondary | ICD-10-CM

## 2015-02-04 DIAGNOSIS — D3502 Benign neoplasm of left adrenal gland: Secondary | ICD-10-CM | POA: Diagnosis not present

## 2015-02-04 DIAGNOSIS — D3501 Benign neoplasm of right adrenal gland: Secondary | ICD-10-CM | POA: Diagnosis not present

## 2015-02-04 DIAGNOSIS — I77811 Abdominal aortic ectasia: Secondary | ICD-10-CM | POA: Diagnosis not present

## 2015-02-04 MED ORDER — IOPAMIDOL (ISOVUE-370) INJECTION 76%
75.0000 mL | Freq: Once | INTRAVENOUS | Status: AC | PRN
  Administered 2015-02-04: 75 mL via INTRAVENOUS

## 2015-02-09 ENCOUNTER — Ambulatory Visit (INDEPENDENT_AMBULATORY_CARE_PROVIDER_SITE_OTHER): Payer: Commercial Managed Care - HMO | Admitting: Cardiology

## 2015-02-09 ENCOUNTER — Encounter: Payer: Self-pay | Admitting: Cardiology

## 2015-02-09 VITALS — BP 124/72 | HR 81 | Ht 69.0 in | Wt 242.0 lb

## 2015-02-09 DIAGNOSIS — I2510 Atherosclerotic heart disease of native coronary artery without angina pectoris: Secondary | ICD-10-CM

## 2015-02-09 DIAGNOSIS — I714 Abdominal aortic aneurysm, without rupture, unspecified: Secondary | ICD-10-CM

## 2015-02-09 DIAGNOSIS — I480 Paroxysmal atrial fibrillation: Secondary | ICD-10-CM

## 2015-02-09 DIAGNOSIS — E785 Hyperlipidemia, unspecified: Secondary | ICD-10-CM

## 2015-02-09 MED ORDER — FENOFIBRATE 48 MG PO TABS: 48.0000 mg | ORAL_TABLET | Freq: Every day | ORAL | Status: DC

## 2015-02-09 NOTE — Patient Instructions (Signed)
Medication Instructions:   Start fenofibrate 48mg  daily.   Labwork: Your physician recommends that you return for a FASTING lipid profile--in 2 months.   Testing/Procedures: None today  Follow-Up: Your physician wants you to follow-up in: 6 months with Dr [name] Dubin. (October 2016).  You will receive a reminder letter in the mail two months in advance. If you don't receive a letter, please call our office to schedule the follow-up appointment.    Dr [name] Dubin recommends that you walk at least 30 minutes per day.

## 2015-02-10 NOTE — Progress Notes (Signed)
Patient ID: Philip [name], male   DOB: October 29, 1941, 74 y.o.   MRN: [id number]  PCP: Dr. Larose Kells  74 yo with history of CAD s/p CABG in 1999 presents for cardiology followup.  He noted a prominent abdominal pulsation when using his inversion table and told his PCP about this.  Therefore, he had an abdominal ultrasound with a 7 cm AAA noted. He was seen by [name] for discussion of repair and then referred back to this office for pre-operative evaluation.  He was seen by the PA in this office in 7/15, and at that appointment was noted to be in atrial fibrillation with controlled rate.  He had not noticed any palpitations or change in his baseline symptoms. He was not started on anticoagulation given plan for AAA surgery in the near future.  At subsequent appointments (including today), he has been in NSR.  He was sent for a pre-op Lexiscan Cardiolite in 7/15. This study was intermediate risk, showing a reversible anterior and apical perfusion defect and a fixed basal to mid inferior defect with EF 48%.  Echo was done to reassess EF.  This showed EF 55-60%.  I did LHC in 7/15 given abnormal Cardiolite.  This showed patent grafts.  In 9/67, he had uncomplicated endovascular repair of his AAA. 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   Currently, he is doing well.  No exertional dyspnea or chest pain.  He golfs twice a week.  He has some right hip pain so has not been exercising as much and weight is up 8 lbs.  Some generalized fatigue.  He is in NSR today and has had no palpitations.   ECG (05/07/14): Atrial fibrillation with PVCs ECG (05/20/14): NSR, RAE ECG (06/18/14): NSR, RAE, IVCD ECG (08/04/14): NSR, LVH, old inferior MI ECG (4/16): NSR, LVH  Labs (11/13): K 4.5, creatinine 1.1, LDL 74, HDL 36 Labs (8/14): K 4.4, creatinine 1.2, LDL 53, HDL 36 Labs (1/15): LDL 54, HDL 37 Labs (6/15): K 4.6, creatinine 1.6 Labs (8/15): K 3.9, creatinine 1.1 Labs (9/15): K  4.5, creatinine 0.95, HCT 38.7 Labs (1/16): HCT 45.2, K 4.5, creatinine 1.2, HDL 35, TGs 321, LDL 63  PMH: 1. Hyperlipidemia 2. HTN 3. CAD s/p CABG 1999 with LIMA-LAD, SVG-LCx, SVG-RCA.  [place] 2009 with 3/3 grafts patent. Lexiscan Cardiolite (7/15) with EF 48%, reversible anterior and apical perfusion defect and a fixed basal to mid inferior defect.  Echo (7/15) with EF 55-60%, mild LAE.  LHC (7/15) with total occlusion of LM and RCA; SVG-OM patent, SVG-RCA patent, LIMA-LAD patent.  4. H/o melanoma 5. OSA on CPAP 6. AAA: s/p endovascular repair in 9/15. Type II endoleak noted . 7. Atrial fibrillation: Paroxysmal, 1st noted in 7/15.  8. CKD  SH: Retired, married, 3 kids, quit smoking in 1978.  Lives in [place].   FH: Mother with CAD.   ROS: All systems reviewed and negative except as per HPI.   Current Outpatient Prescriptions  Medication Sig Dispense Refill  . amLODipine (NORVASC) 10 MG tablet Take 1 tablet (10 mg total) by mouth daily. 180 tablet 1  . apixaban (ELIQUIS) 5 MG TABS tablet Take 1 tablet (5 mg total) by mouth 2 (two) times daily. 60 tablet 6  . atorvastatin (LIPITOR) 40 MG tablet TAKE 1 TABLET EVERY DAY 90 tablet 2  . B Complex-Biotin-FA (B-COMPLEX PO) Take 1 tablet by mouth daily.    . Cholecalciferol (VITAMIN  D3) 5000 UNITS CAPS Take 5,000 Units by mouth daily.    Marland Kitchen co-enzyme Q-10 30 MG capsule Take 30 mg by mouth daily.    . fenofibrate (TRICOR) 48 MG tablet Take 1 tablet (48 mg total) by mouth daily. 90 tablet 3  . lisinopril (PRINIVIL,ZESTRIL) 20 MG tablet Take 0.5 tablets (10 mg total) by mouth daily. 90 tablet 1  . magnesium oxide (MAG-OX) 400 MG tablet Take 800 mg by mouth daily.    . metoprolol succinate (TOPROL-XL) 100 MG 24 hr tablet TAKE 1 TABLET DAILY. TAKE WITH OR IMMEDIATELY FOLLOWING A MEAL. 90 tablet 0  . Multiple Vitamins-Minerals (MULTIVITAMIN WITH MINERALS) tablet Take 1 tablet by mouth daily.    . Omega-3 Fatty Acids (FISH OIL) 1200 MG CAPS  Take 1,200 mg by mouth daily.    Marland Kitchen omeprazole (PRILOSEC) 20 MG capsule Take 1 capsule (20 mg total) by mouth daily. 90 capsule 3  . Probiotic Product (PROBIOTIC DAILY) CAPS Take 1 capsule by mouth daily.     No current facility-administered medications for this visit.    BP 124/72 mmHg  Pulse 81  Ht 5\' 9"  (1.753 m)  Wt 242 lb (109.77 kg)  BMI 35.72 kg/m2 General: NAD Neck: No JVD, no thyromegaly or thyroid nodule.  Lungs: Clear to auscultation bilaterally with normal respiratory effort. CV: Nondisplaced PMI.  Heart regular S1/S2, no S3/S4, no murmur.  1+ edema 3/4 to knees bilaterally.  No carotid bruit.  Normal pedal pulses.  Abdomen: Soft, nontender, no hepatosplenomegaly, no distention.  Skin: Intact without lesions or rashes.  Neurologic: Alert and oriented x 3.  Psych: Normal affect. Extremities: No clubbing or cyanosis.   Assessment/Plan 1. CAD: s/p CABG in 1999. He was set up for [employer] prior to vascular surgery (AAA repair) and was found to have an intermediate risk study with anterior and apical ischemia.  LHC in 7/15 showed patent grafts, no intervention needed.  He has no ischemic symptoms.  - Continue current regimen of ASA 81, statin, ACEI, and Toprol XL.  2. HTN: BP is under good control.  3. Hyperlipidemia: Continue atorvastatin, good LDL recently.  Triglycerides quite high, will add fenofibrate 48 mg daily with lipids repeated in 2 months.   4. AAA: s/p uncomplicated endovascular repair in 9/15.  Has type II endoleak followed by [name].  5. CKD: Improved creatinine recently. 6. Atrial fibrillation: Paroxysmal.  He is in NSR today.  CHADSVASC = 3.  He has been on Eliquis, will continue.  CBC ok recently.  7. Obesity: He needs weight loss.  We discussed diet and increased exercise.    [name] 02/10/2015

## 2015-02-17 ENCOUNTER — Encounter: Payer: Self-pay | Admitting: Vascular Surgery

## 2015-02-18 ENCOUNTER — Ambulatory Visit (INDEPENDENT_AMBULATORY_CARE_PROVIDER_SITE_OTHER): Payer: Commercial Managed Care - HMO | Admitting: Vascular Surgery

## 2015-02-18 ENCOUNTER — Encounter: Payer: Self-pay | Admitting: Vascular Surgery

## 2015-02-18 VITALS — BP 119/74 | HR 67 | Ht 69.0 in | Wt 241.0 lb

## 2015-02-18 DIAGNOSIS — Z48812 Encounter for surgical aftercare following surgery on the circulatory system: Secondary | ICD-10-CM

## 2015-02-18 DIAGNOSIS — I714 Abdominal aortic aneurysm, without rupture, unspecified: Secondary | ICD-10-CM

## 2015-02-18 NOTE — Progress Notes (Signed)
[facility] PATIENT  Patient name: Philip [name] MRN: [id number] DOB: [birth year] Sex: male  REASON FOR VISIT: 6 month follow up  HPI: Philip [name] is a 74 y.o. 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 He comes in for six-month follow up visit. 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 After discussing the films with Dr. 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  Since I saw him last he is been doing well and denies significant abdominal pain or back pain. He is on a statin but does not take aspirin as he is on Eliquis.   Past Medical History  Diagnosis Date  . Hyperlipidemia   . CAD (coronary artery disease)     had a MI , s/p CABG  . Skin cancer     several , one of them was melanoma (sees derm routinely)  . OSA (obstructive sleep apnea)     started CPAP 12/09  . Peripheral vascular disease   . AAA (abdominal aortic aneurysm)   . Hypertension     takes Amlodipine,Metoprolol,and Lisinopril daily  . GERD (gastroesophageal reflux disease)     takes Omeprazole daily  . Myocardial infarction     several with last one being 2001(but never knew about any except 2001)  . Dysrhythmia     paroximal a fib  . Pneumonia     as a child  . Tingling     feet occasionally  . Joint pain   . Joint swelling   . History of colon polyps   . History of kidney stones   . Cataracts, bilateral     immature  . History of shingles   . Tinnitus   . Atrial fibrillation   . CHF (congestive heart failure)    Family History  Problem Relation Age of Onset  . Sleep apnea Brother   . Hyperlipidemia Brother   . Hypertension Brother   . Colon cancer Other     aunt  . Prostate cancer Father 13  . Heart disease Father   . Skin cancer Father   . Heart  attack Father   . Cancer Father   . Deep vein thrombosis Father   . Hyperlipidemia Father   . Hypertension Father   . Heart disease Mother   . Skin cancer Mother   . Heart attack Mother   . Cancer Mother   . Hyperlipidemia Mother   . Hypertension Mother   . Varicose Veins Mother   . Peripheral vascular disease Mother     amputation  . Diabetes Neg Hx   . Stroke Neg Hx   . Skin cancer Brother   . Skin cancer Sister   . Cancer Sister   . Heart disease Sister    SOCIAL HISTORY: History  Substance Use Topics  . Smoking status: Former Research scientist (life sciences)  . Smokeless tobacco: Never Used     Comment: quit smoking in 1979  . Alcohol Use: Yes     Comment: occasionally   No Known Allergies Current Outpatient Prescriptions  Medication Sig Dispense Refill  . amLODipine (NORVASC) 10 MG tablet Take 1 tablet (10 mg total) by mouth daily. 180 tablet 1  . apixaban (ELIQUIS) 5 MG TABS tablet Take  1 tablet (5 mg total) by mouth 2 (two) times daily. 60 tablet 6  . atorvastatin (LIPITOR) 40 MG tablet TAKE 1 TABLET EVERY DAY 90 tablet 2  . B Complex-Biotin-FA (B-COMPLEX PO) Take 1 tablet by mouth daily.    . Cholecalciferol (VITAMIN D3) 5000 UNITS CAPS Take 5,000 Units by mouth daily.    Marland Kitchen co-enzyme Q-10 30 MG capsule Take 30 mg by mouth daily.    . fenofibrate (TRICOR) 48 MG tablet Take 1 tablet (48 mg total) by mouth daily. 90 tablet 3  . lisinopril (PRINIVIL,ZESTRIL) 20 MG tablet Take 0.5 tablets (10 mg total) by mouth daily. 90 tablet 1  . magnesium oxide (MAG-OX) 400 MG tablet Take 800 mg by mouth daily.    . metoprolol succinate (TOPROL-XL) 100 MG 24 hr tablet TAKE 1 TABLET DAILY. TAKE WITH OR IMMEDIATELY FOLLOWING A MEAL. 90 tablet 0  . Multiple Vitamins-Minerals (MULTIVITAMIN WITH MINERALS) tablet Take 1 tablet by mouth daily.    . Omega-3 Fatty Acids (FISH OIL) 1200 MG CAPS Take 1,200 mg by mouth daily.    Marland Kitchen omeprazole (PRILOSEC) 20 MG capsule Take 1 capsule (20 mg total) by mouth daily. 90  capsule 3  . Probiotic Product (PROBIOTIC DAILY) CAPS Take 1 capsule by mouth daily.     No current facility-administered medications for this visit.   REVIEW OF SYSTEMS: Valu.Nieves ] denotes positive finding; [  ] denotes negative finding  CARDIOVASCULAR:  [ ]  chest pain   [ ]  chest pressure   [ ]  palpitations   [ ]  orthopnea   [ ]  dyspnea on exertion   [ ]  claudication   [ ]  rest pain   [ ]  DVT   [ ]  phlebitis PULMONARY:   [ ]  productive cough   [ ]  asthma   [ ]  wheezing NEUROLOGIC:   [ ]  weakness  [ ]  paresthesias  [ ]  aphasia  [ ]  amaurosis  [ ]  dizziness HEMATOLOGIC:   [ ]  bleeding problems   [ ]  clotting disorders MUSCULOSKELETAL:  [ ]  joint pain   [ ]  joint swelling [ ]  leg swelling GASTROINTESTINAL: [ ]   blood in stool  [ ]   hematemesis GENITOURINARY:  [ ]   dysuria  [ ]   hematuria PSYCHIATRIC:  [ ]  history of major depression INTEGUMENTARY:  [ ]  rashes  [ ]  ulcers CONSTITUTIONAL:  [ ]  fever   [ ]  chills  PHYSICAL EXAM: Filed Vitals:   02/18/15 1503  BP: 119/74  Pulse: 67  Height: 5\' 9"  (1.753 m)  Weight: 241 lb (109.317 kg)  SpO2: 98%   GENERAL: The patient is a well-nourished male, in no acute distress. The vital signs are documented above. CARDIOVASCULAR: There is a regular rate and rhythm. I do not detect carotid bruits. He has palpable femoral popliteal pedal pulses bilaterally. PULMONARY: There is good air exchange bilaterally without wheezing or rales. ABDOMEN: Soft and non-tender with normal pitched bowel sounds.  MUSCULOSKELETAL: There are no major deformities or cyanosis. NEUROLOGIC: No focal weakness or paresthesias are detected. SKIN: There are no ulcers or rashes noted. PSYCHIATRIC: The patient has a normal affect.  DATA:  I have reviewed the images from his CT scan. There is no longer any evidence of a type II endoleak. The aneurysm measures 6.9 cm in maximum diameter and is thus stable in size. There is ectasia of the right hypogastric artery which measures 1.3 cm.  The left hypogastric artery measures 2.0 cm and this too is stable.  MEDICAL ISSUES: STATUS POST PERCUTANEOUS ENDOVASCULAR ANEURYSM REPAIR: CT scan shows no evidence of endoleak and the aneurysm remains stable in size. I have ordered a follow up ultrasound in 1 year and I will see him back at that time. 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 Fortunately he is not a smoker. He knows to call sooner if he has problems.   Return in about 1 year (around 02/18/2016).   [place] [facility] [place] [facility]: [phone]

## 2015-02-24 NOTE — Addendum Note (Signed)
Addended by: Mena Goes on: 02/24/2015 02:50 PM   Modules accepted: Orders

## 2015-03-03 ENCOUNTER — Encounter: Payer: Self-pay | Admitting: Internal Medicine

## 2015-03-03 ENCOUNTER — Ambulatory Visit (INDEPENDENT_AMBULATORY_CARE_PROVIDER_SITE_OTHER): Payer: Commercial Managed Care - HMO | Admitting: Internal Medicine

## 2015-03-03 ENCOUNTER — Other Ambulatory Visit: Payer: Self-pay

## 2015-03-03 VITALS — BP 120/64 | HR 61 | Temp 98.3°F | Ht 69.0 in | Wt 239.0 lb

## 2015-03-03 DIAGNOSIS — M79621 Pain in right upper arm: Secondary | ICD-10-CM

## 2015-03-03 DIAGNOSIS — D179 Benign lipomatous neoplasm, unspecified: Secondary | ICD-10-CM

## 2015-03-03 DIAGNOSIS — M25521 Pain in right elbow: Secondary | ICD-10-CM

## 2015-03-03 NOTE — Progress Notes (Signed)
Pre visit review using our clinic review tool, if applicable. No additional management support is needed unless otherwise documented below in the visit note. 

## 2015-03-03 NOTE — Progress Notes (Signed)
Subjective:    Patient ID: Philip [name], male    DOB: 1941/07/17, 74 y.o.   MRN: [id number]  DOS:  03/03/2015 Type of visit - description : acute visit Interval history:  Patient is a 74 year old male with a history of Skin Cancer, HTN, AAA, and AFib in today c/o right arm pain and a subcutaneous mass on the right arm. He does not know when the mass appeared, but he first noticed it yesterday. He states it is not giving him any trouble and is not really tender or painful. Also notes that it is not erythematous. He denies any previous instances of this occurring.   He also notes stiffness and soreness in his right arm for the past few days. It is most pronounced in the distal bicep location and in the proximal posterior forearm. He has not taken any medication to alleviate the pain. Notes that pain is worst with pronation/supination against resistance. Patient is right handed. Strenous activity consists of golfing multiple times per week.   Review of Systems  Constitutional: No fever, chills. Cardiovascular: No CP, leg swelling  GI: no nausea, vomiting, diarrhea or abdominal pain.  Musculoskeletal: As per HPI Skin: As per HPI Neurological: No headaches.    Past Medical History  Diagnosis Date  . Hyperlipidemia   . CAD (coronary artery disease)     had a MI , s/p CABG  . Skin cancer     several , one of them was melanoma (sees derm routinely)  . OSA (obstructive sleep apnea)     started CPAP 12/09  . Peripheral vascular disease   . AAA (abdominal aortic aneurysm)   . Hypertension     takes Amlodipine,Metoprolol,and Lisinopril daily  . GERD (gastroesophageal reflux disease)     takes Omeprazole daily  . Myocardial infarction     several with last one being 2001(but never knew about any except 2001)  . Dysrhythmia     paroximal a fib  . Pneumonia     as a child  . Tingling     feet occasionally  . Joint pain   . Joint swelling   . History of colon polyps   . History of  kidney stones   . Cataracts, bilateral     immature  . History of shingles   . Tinnitus   . Atrial fibrillation   . CHF (congestive heart failure)     Past Surgical History  Procedure Laterality Date  . Penile prosthesis implant  08/2005  . Rhinoplasty  1975  . Coronary artery bypass graft  1999    x 3 vessels  . Cardiac catheterization  2015  . Colonoscopy    . Abdominal aortic endovascular stent graft N/A 07/08/2014    Procedure: ABDOMINAL AORTIC ENDOVASCULAR STENT GRAFT/ GORE;  Surgeon: Angelia Mould, MD;  Location: [place];  Service: Vascular;  Laterality: N/A;  . Left heart catheterization with coronary/graft angiogram N/A 05/29/2014    Procedure: LEFT HEART CATHETERIZATION WITH [name];  Surgeon: [name], MD;  Location: [facility] CATH LAB;  Service: Cardiovascular;  Laterality: N/A;  . Abdominal aortagram N/A 06/16/2014    Procedure: ABDOMINAL AORTAGRAM;  Surgeon: Angelia Mould, MD;  Location: [facility] CATH LAB;  Service: Cardiovascular;  Laterality: N/A;    History   Social History  . Marital Status: Married    Spouse Name: N/A  . Number of Children: 3  . Years of Education: N/A   Occupational History  .  retired, Actor    Social History Main Topics  . Smoking status: Former Research scientist (life sciences)  . Smokeless tobacco: Never Used     Comment: quit smoking in 1979  . Alcohol Use: Yes     Comment: occasionally  . Drug Use: No  . Sexual Activity: Not Currently   Other Topics Concern  . Not on file   Social History Narrative   1 son, 2 step children     Family History  Problem Relation Age of Onset  . Sleep apnea Brother   . Hyperlipidemia Brother   . Hypertension Brother   . Colon cancer Other     aunt  . Prostate cancer Father 69  . Heart disease Father   . Skin cancer Father   . Heart attack Father   . Cancer Father   . Deep vein thrombosis Father   . Hyperlipidemia Father   . Hypertension Father   . Heart disease Mother   . Skin  cancer Mother   . Heart attack Mother   . Cancer Mother   . Hyperlipidemia Mother   . Hypertension Mother   . Varicose Veins Mother   . Peripheral vascular disease Mother     amputation  . Diabetes Neg Hx   . Stroke Neg Hx   . Skin cancer Brother   . Skin cancer Sister   . Cancer Sister   . Heart disease Sister        Medication List       This list is accurate as of: 03/03/15 11:59 PM.  Always use your most recent med list.               amLODipine 10 MG tablet  Commonly known as:  NORVASC  Take 1 tablet (10 mg total) by mouth daily.     apixaban 5 MG Tabs tablet  Commonly known as:  ELIQUIS  Take 1 tablet (5 mg total) by mouth 2 (two) times daily.     atorvastatin 40 MG tablet  Commonly known as:  LIPITOR  TAKE 1 TABLET EVERY DAY     B-COMPLEX PO  Take 1 tablet by mouth daily.     co-enzyme Q-10 30 MG capsule  Take 30 mg by mouth daily.     fenofibrate 48 MG tablet  Commonly known as:  TRICOR  Take 1 tablet (48 mg total) by mouth daily.     Fish Oil 1200 MG Caps  Take 1,200 mg by mouth daily.     lisinopril 20 MG tablet  Commonly known as:  PRINIVIL,ZESTRIL  Take 0.5 tablets (10 mg total) by mouth daily.     magnesium oxide 400 MG tablet  Commonly known as:  MAG-OX  Take 800 mg by mouth daily.     metoprolol succinate 100 MG 24 hr tablet  Commonly known as:  TOPROL-XL  TAKE 1 TABLET DAILY. TAKE WITH OR IMMEDIATELY FOLLOWING A MEAL.     multivitamin with minerals tablet  Take 1 tablet by mouth daily.     omeprazole 20 MG capsule  Commonly known as:  PRILOSEC  Take 1 capsule (20 mg total) by mouth daily.     PROBIOTIC DAILY Caps  Take 1 capsule by mouth daily.     Vitamin D3 5000 UNITS Caps  Take 5,000 Units by mouth daily.           Objective:   Physical Exam  Musculoskeletal:       Arms:  BP 120/64 mmHg  Pulse 61  Temp(Src) 98.3 F (36.8 C) (Oral)  Ht 5\' 9"  (1.753 m)  Wt 239 lb (108.41 kg)  BMI 35.28 kg/m2  SpO2  95%  General:   Well developed, well nourished . NAD.  HEENT:  Normocephalic . Face symmetric, atraumatic Lungs:  CTA B Normal respiratory effort, no intercostal retractions, no accessory muscle use. Heart: RRR,  no murmur, distal pulses intact Muscle skeletal: no pretibial edema bilaterally  1cm round subcutaneous mass,  not attached to deeper structures, located 2 cm proximal to right anterior elbow; area is not red or warm Right proximal posterior forearm TTP Pain in proximal posterior forearm with elbow flexion against resistance. No joint tenderness or erythema, full ROM. Biceps symmetrical bilaterally Skin: Not pale. Not jaundice Neurologic:  alert & oriented X3.  Speech normal, gait appropriate for age and unassisted  Psych--  Cognition and judgment appear intact.  Cooperative with normal attention span and concentration.  Behavior appropriate. No anxious or depressed appearing.     Assessment & Plan:  (Patient seen along with   Aletta Edouard, medical student)   Lump in arm: Mass is  nontender, and not erythematous. Given nonthreatening nature of this condition, it is likely a   lipoma. Patient instructed to observe and inform us if mass grows or becomes painful or inflamed.    Arm Pain: Pain is most noticeable with flexion at elbow against resistance. Given the right-handedness of the patient combined with history of frequent golfing, it is likely muscle ache due to overuse in the golf swing. Patient instructed to inform us if symptoms worsen or do not improve.  Rec OTCs and ice prn

## 2015-03-17 ENCOUNTER — Other Ambulatory Visit: Payer: Self-pay | Admitting: Cardiology

## 2015-03-31 ENCOUNTER — Telehealth: Payer: Self-pay | Admitting: Internal Medicine

## 2015-03-31 DIAGNOSIS — Z85828 Personal history of other malignant neoplasm of skin: Secondary | ICD-10-CM

## 2015-03-31 NOTE — Telephone Encounter (Signed)
Caller name: Paige Vanderwoude Relationship to patient: self Can be reached: [phone]  Reason for call: Pt has appt at [facility] in [place] with Dr. Nena Polio on 04/15/15. Can we please enter referral for him?

## 2015-03-31 NOTE — Telephone Encounter (Signed)
Referral placed to Dr. [name].

## 2015-04-13 ENCOUNTER — Other Ambulatory Visit (INDEPENDENT_AMBULATORY_CARE_PROVIDER_SITE_OTHER): Payer: Commercial Managed Care - HMO | Admitting: *Deleted

## 2015-04-13 DIAGNOSIS — E785 Hyperlipidemia, unspecified: Secondary | ICD-10-CM

## 2015-04-13 LAB — LIPID PANEL
Cholesterol: 144 mg/dL (ref 0–200)
HDL: 35.8 mg/dL — ABNORMAL LOW (ref >39.00)
NONHDL: 108.2
Total CHOL/HDL Ratio: 4
Triglycerides: 216 mg/dL — ABNORMAL HIGH (ref 0.0–149.0)
VLDL: 43.2 mg/dL — ABNORMAL HIGH (ref 0.0–40.0)

## 2015-04-13 LAB — LDL CHOLESTEROL, DIRECT: Direct LDL: 68 mg/dL

## 2015-04-13 NOTE — Addendum Note (Signed)
Addended by: Eulis Foster on: 04/13/2015 08:22 AM   Modules accepted: Orders

## 2015-04-14 ENCOUNTER — Emergency Department (HOSPITAL_COMMUNITY): Payer: Commercial Managed Care - HMO

## 2015-04-14 ENCOUNTER — Encounter (HOSPITAL_COMMUNITY): Payer: Self-pay | Admitting: *Deleted

## 2015-04-14 ENCOUNTER — Emergency Department (HOSPITAL_COMMUNITY)
Admission: EM | Admit: 2015-04-14 | Discharge: 2015-04-14 | Disposition: A | Discharge status: Home / Self Care | Payer: Commercial Managed Care - HMO | Attending: Emergency Medicine | Admitting: Emergency Medicine

## 2015-04-14 DIAGNOSIS — Z85828 Personal history of other malignant neoplasm of skin: Secondary | ICD-10-CM | POA: Insufficient documentation

## 2015-04-14 DIAGNOSIS — H269 Unspecified cataract: Secondary | ICD-10-CM | POA: Diagnosis not present

## 2015-04-14 DIAGNOSIS — Z86010 Personal history of colonic polyps: Secondary | ICD-10-CM | POA: Insufficient documentation

## 2015-04-14 DIAGNOSIS — I10 Essential (primary) hypertension: Secondary | ICD-10-CM | POA: Insufficient documentation

## 2015-04-14 DIAGNOSIS — Z87891 Personal history of nicotine dependence: Secondary | ICD-10-CM | POA: Insufficient documentation

## 2015-04-14 DIAGNOSIS — N23 Unspecified renal colic: Secondary | ICD-10-CM | POA: Diagnosis not present

## 2015-04-14 DIAGNOSIS — Z8701 Personal history of pneumonia (recurrent): Secondary | ICD-10-CM | POA: Insufficient documentation

## 2015-04-14 DIAGNOSIS — Z87442 Personal history of urinary calculi: Secondary | ICD-10-CM | POA: Insufficient documentation

## 2015-04-14 DIAGNOSIS — I509 Heart failure, unspecified: Secondary | ICD-10-CM | POA: Insufficient documentation

## 2015-04-14 DIAGNOSIS — N400 Enlarged prostate without lower urinary tract symptoms: Secondary | ICD-10-CM | POA: Diagnosis not present

## 2015-04-14 DIAGNOSIS — K219 Gastro-esophageal reflux disease without esophagitis: Secondary | ICD-10-CM | POA: Diagnosis not present

## 2015-04-14 DIAGNOSIS — Z8619 Personal history of other infectious and parasitic diseases: Secondary | ICD-10-CM | POA: Diagnosis not present

## 2015-04-14 DIAGNOSIS — I252 Old myocardial infarction: Secondary | ICD-10-CM | POA: Diagnosis not present

## 2015-04-14 DIAGNOSIS — G4733 Obstructive sleep apnea (adult) (pediatric): Secondary | ICD-10-CM | POA: Diagnosis not present

## 2015-04-14 DIAGNOSIS — Z79899 Other long term (current) drug therapy: Secondary | ICD-10-CM | POA: Insufficient documentation

## 2015-04-14 DIAGNOSIS — E785 Hyperlipidemia, unspecified: Secondary | ICD-10-CM | POA: Insufficient documentation

## 2015-04-14 DIAGNOSIS — R1012 Left upper quadrant pain: Secondary | ICD-10-CM | POA: Diagnosis not present

## 2015-04-14 DIAGNOSIS — Z9981 Dependence on supplemental oxygen: Secondary | ICD-10-CM | POA: Insufficient documentation

## 2015-04-14 DIAGNOSIS — I2510 Atherosclerotic heart disease of native coronary artery without angina pectoris: Secondary | ICD-10-CM | POA: Diagnosis not present

## 2015-04-14 DIAGNOSIS — N132 Hydronephrosis with renal and ureteral calculous obstruction: Secondary | ICD-10-CM | POA: Diagnosis not present

## 2015-04-14 DIAGNOSIS — R109 Unspecified abdominal pain: Secondary | ICD-10-CM

## 2015-04-14 DIAGNOSIS — E860 Dehydration: Secondary | ICD-10-CM | POA: Diagnosis not present

## 2015-04-14 DIAGNOSIS — Z9889 Other specified postprocedural states: Secondary | ICD-10-CM | POA: Diagnosis not present

## 2015-04-14 LAB — CBC WITH DIFFERENTIAL/PLATELET
Basophils Absolute: 0 10*3/uL (ref 0.0–0.1)
Basophils Relative: 0 % (ref 0–1)
Eosinophils Absolute: 0 10*3/uL (ref 0.0–0.7)
Eosinophils Relative: 0 % (ref 0–5)
HCT: 41.7 % (ref 39.0–52.0)
HEMOGLOBIN: 14.7 g/dL (ref 13.0–17.0)
LYMPHS ABS: 1.5 10*3/uL (ref 0.7–4.0)
LYMPHS PCT: 13 % (ref 12–46)
MCH: 31.1 pg (ref 26.0–34.0)
MCHC: 35.3 g/dL (ref 30.0–36.0)
MCV: 88.3 fL (ref 78.0–100.0)
MONO ABS: 0.5 10*3/uL (ref 0.1–1.0)
MONOS PCT: 4 % (ref 3–12)
NEUTROS ABS: 9.2 10*3/uL — AB (ref 1.7–7.7)
NEUTROS PCT: 83 % — AB (ref 43–77)
Platelets: 167 10*3/uL (ref 150–400)
RBC: 4.72 MIL/uL (ref 4.22–5.81)
RDW: 13.1 % (ref 11.5–15.5)
WBC: 11.2 10*3/uL — AB (ref 4.0–10.5)

## 2015-04-14 LAB — COMPREHENSIVE METABOLIC PANEL
ALBUMIN: 4.4 g/dL (ref 3.5–5.0)
ALT: 24 U/L (ref 17–63)
ANION GAP: 12 (ref 5–15)
AST: 32 U/L (ref 15–41)
Alkaline Phosphatase: 60 U/L (ref 38–126)
BUN: 20 mg/dL (ref 6–20)
CALCIUM: 9.6 mg/dL (ref 8.9–10.3)
CO2: 23 mmol/L (ref 22–32)
Chloride: 101 mmol/L (ref 101–111)
Creatinine, Ser: 1.75 mg/dL — ABNORMAL HIGH (ref 0.61–1.24)
GFR calc Af Amer: 42 mL/min — ABNORMAL LOW (ref >60)
GFR calc non Af Amer: 37 mL/min — ABNORMAL LOW (ref >60)
GLUCOSE: 133 mg/dL — AB (ref 65–99)
Potassium: 4.3 mmol/L (ref 3.5–5.1)
SODIUM: 136 mmol/L (ref 135–145)
TOTAL PROTEIN: 7.4 g/dL (ref 6.5–8.1)
Total Bilirubin: 1.1 mg/dL (ref 0.3–1.2)

## 2015-04-14 LAB — URINALYSIS, ROUTINE W REFLEX MICROSCOPIC
BILIRUBIN URINE: NEGATIVE
Glucose, UA: NEGATIVE mg/dL
Hgb urine dipstick: NEGATIVE
Ketones, ur: 15 mg/dL — AB
LEUKOCYTES UA: NEGATIVE
NITRITE: NEGATIVE
PH: 7.5 (ref 5.0–8.0)
Protein, ur: NEGATIVE mg/dL
Specific Gravity, Urine: 1.012 (ref 1.005–1.030)
UROBILINOGEN UA: 0.2 mg/dL (ref 0.0–1.0)

## 2015-04-14 LAB — I-STAT TROPONIN, ED: Troponin i, poc: 0 ng/mL (ref 0.00–0.08)

## 2015-04-14 LAB — LIPASE, BLOOD: LIPASE: 37 U/L (ref 22–51)

## 2015-04-14 MED ORDER — OXYCODONE-ACETAMINOPHEN 5-325 MG PO TABS: 1.0000 | ORAL_TABLET | Freq: Four times a day (QID) | ORAL | Status: DC | PRN

## 2015-04-14 MED ORDER — ONDANSETRON 4 MG PO TBDP: ORAL_TABLET | ORAL | Status: DC

## 2015-04-14 MED ORDER — OXYCODONE-ACETAMINOPHEN 5-325 MG PO TABS
1.0000 | ORAL_TABLET | Freq: Once | ORAL | Status: AC
  Administered 2015-04-14: 1 via ORAL
  Filled 2015-04-14: qty 1

## 2015-04-14 MED ORDER — ONDANSETRON 4 MG PO TBDP
8.0000 mg | ORAL_TABLET | Freq: Once | ORAL | Status: AC
  Administered 2015-04-14: 8 mg via ORAL
  Filled 2015-04-14: qty 2

## 2015-04-14 MED ORDER — ONDANSETRON HCL 4 MG/2ML IJ SOLN: 4.0000 mg | Freq: Once | INTRAMUSCULAR | Status: DC

## 2015-04-14 MED ORDER — MORPHINE SULFATE 4 MG/ML IJ SOLN: 4.0000 mg | Freq: Once | INTRAMUSCULAR | Status: DC

## 2015-04-14 MED ORDER — SODIUM CHLORIDE 0.9 % IV BOLUS (SEPSIS)
1000.0000 mL | Freq: Once | INTRAVENOUS | Status: AC
  Administered 2015-04-14: 1000 mL via INTRAVENOUS

## 2015-04-14 MED ORDER — TAMSULOSIN HCL 0.4 MG PO CAPS: 0.4000 mg | ORAL_CAPSULE | Freq: Every day | ORAL | Status: DC

## 2015-04-14 NOTE — ED Provider Notes (Addendum)
CSN: [id number]     Arrival date & time 04/14/15  1904 History   First MD Initiated Contact with Patient 04/14/15 1945     Chief Complaint  Patient presents with  . Flank Pain     (Consider location/radiation/quality/duration/timing/severity/associated sxs/prior Treatment) The history is provided by the patient.  DARRELL HAUK is a 74 y.o. male history of hyperlipidemia, CAD, AAA after repair here presenting with left flank pain. Acute onset left flank pain since 3 AM. He states that the pain is constant and radiates down his groin. Also has been urinating less than usual. He has been feeling nauseated and vomited once. Denies any fevers or chills.    Past Medical History  Diagnosis Date  . Hyperlipidemia   . CAD (coronary artery disease)     had a MI , s/p CABG  . Skin cancer     several , one of them was melanoma (sees derm routinely)  . OSA (obstructive sleep apnea)     started CPAP 12/09  . Peripheral vascular disease   . AAA (abdominal aortic aneurysm)   . Hypertension     takes Amlodipine,Metoprolol,and Lisinopril daily  . GERD (gastroesophageal reflux disease)     takes Omeprazole daily  . Myocardial infarction     several with last one being 2001(but never knew about any except 2001)  . Dysrhythmia     paroximal a fib  . Pneumonia     as a child  . Tingling     feet occasionally  . Joint pain   . Joint swelling   . History of colon polyps   . History of kidney stones   . Cataracts, bilateral     immature  . History of shingles   . Tinnitus   . Atrial fibrillation   . CHF (congestive heart failure)    Past Surgical History  Procedure Laterality Date  . Penile prosthesis implant  08/2005  . Rhinoplasty  1975  . Coronary artery bypass graft  1999    x 3 vessels  . Cardiac catheterization  2015  . Colonoscopy    . Abdominal aortic endovascular stent graft N/A 07/08/2014    Procedure: ABDOMINAL AORTIC ENDOVASCULAR STENT GRAFT/ GORE;  Surgeon: Angelia Mould, MD;  Location: [facility];  Service: Vascular;  Laterality: N/A;  . Left heart catheterization with coronary/graft angiogram N/A 05/29/2014    Procedure: LEFT HEART CATHETERIZATION WITH [name];  Surgeon: [name], MD;  Location: [facility] CATH LAB;  Service: Cardiovascular;  Laterality: N/A;  . Abdominal aortagram N/A 06/16/2014    Procedure: ABDOMINAL AORTAGRAM;  Surgeon: Angelia Mould, MD;  Location: [facility] CATH LAB;  Service: Cardiovascular;  Laterality: N/A;   Family History  Problem Relation Age of Onset  . Sleep apnea Brother   . Hyperlipidemia Brother   . Hypertension Brother   . Colon cancer Other     aunt  . Prostate cancer Father 34  . Heart disease Father   . Skin cancer Father   . Heart attack Father   . Cancer Father   . Deep vein thrombosis Father   . Hyperlipidemia Father   . Hypertension Father   . Heart disease Mother   . Skin cancer Mother   . Heart attack Mother   . Cancer Mother   . Hyperlipidemia Mother   . Hypertension Mother   . Varicose Veins Mother   . Peripheral vascular disease Mother     amputation  . Diabetes Neg  Hx   . Stroke Neg Hx   . Skin cancer Brother   . Skin cancer Sister   . Cancer Sister   . Heart disease Sister    History  Substance Use Topics  . Smoking status: Former Research scientist (life sciences)  . Smokeless tobacco: Never Used     Comment: quit smoking in 1979  . Alcohol Use: Yes     Comment: occasionally    Review of Systems  Gastrointestinal: Positive for nausea and abdominal pain.  Genitourinary: Positive for flank pain.  All other systems reviewed and are negative.     Allergies  Review of patient's allergies indicates no known allergies.  Home Medications   Prior to Admission medications   Medication Sig Start Date End Date Taking? Authorizing Provider  amLODipine (NORVASC) 10 MG tablet Take 1 tablet (10 mg total) by mouth daily. 12/07/14  Yes [name], MD  atorvastatin (LIPITOR) 40 MG tablet TAKE 1  TABLET EVERY DAY Patient taking differently: TAKE 1 TABLET EVERY DAY  AT BEDTIME 08/22/14  Yes [name], MD  B Complex-Biotin-FA (B-COMPLEX PO) Take 1 tablet by mouth daily.   Yes Historical Provider, MD  Cholecalciferol (VITAMIN D3) 5000 UNITS CAPS Take 5,000 Units by mouth daily.   Yes Historical Provider, MD  co-enzyme Q-10 30 MG capsule Take 30 mg by mouth daily.   Yes Historical Provider, MD  ELIQUIS 5 MG TABS tablet TAKE ONE TABLET BY MOUTH TWICE DAILY 03/17/15  Yes [name], MD  fenofibrate (TRICOR) 48 MG tablet Take 1 tablet (48 mg total) by mouth daily. 02/09/15  Yes [name], MD  lisinopril (PRINIVIL,ZESTRIL) 20 MG tablet Take 0.5 tablets (10 mg total) by mouth daily. 10/27/14  Yes [name], MD  magnesium oxide (MAG-OX) 400 MG tablet Take 800 mg by mouth daily.   Yes Historical Provider, MD  metoprolol succinate (TOPROL-XL) 100 MG 24 hr tablet TAKE 1 TABLET DAILY. TAKE WITH OR IMMEDIATELY FOLLOWING A MEAL. 01/16/15  Yes [name], MD  Multiple Vitamins-Minerals (MULTIVITAMIN WITH MINERALS) tablet Take 1 tablet by mouth daily.   Yes Historical Provider, MD  naproxen sodium (ANAPROX) 220 MG tablet Take 440 mg by mouth 2 (two) times daily as needed (FOR PAIN).   Yes Historical Provider, MD  Omega-3 Fatty Acids (FISH OIL) 1200 MG CAPS Take 1,200 mg by mouth daily.   Yes Historical Provider, MD  omeprazole (PRILOSEC) 20 MG capsule Take 1 capsule (20 mg total) by mouth daily. 08/07/14  Yes [name], MD  Probiotic Product (PROBIOTIC DAILY) CAPS Take 1 capsule by mouth daily.   Yes Historical Provider, MD   BP 136/64 mmHg  Pulse 78  Temp(Src) 98 F (36.7 C) (Oral)  Resp 17  Wt 240 lb (108.863 kg)  SpO2 95% Physical Exam  Constitutional: He is oriented to person, place, and time.  Uncomfortable   HENT:  Head: Normocephalic.  MM slightly dry   Eyes: Conjunctivae are normal. Pupils are equal, round, and reactive to light.  Neck: Normal range of  motion. Neck supple.  Cardiovascular: Normal rate, regular rhythm and normal heart sounds.   Pulmonary/Chest: Effort normal and breath sounds normal. No respiratory distress. He has no wheezes. He has no rales.  Abdominal: Soft. Bowel sounds are normal.  Mild LUQ and L CVAT. No rebound   Musculoskeletal: Normal range of motion. He exhibits no edema or tenderness.  Neurological: He is alert and oriented to person, place, and time. No cranial  nerve deficit. Coordination normal.  Skin: Skin is warm and dry.  Psychiatric: He has a normal mood and affect. His behavior is normal. Judgment and thought content normal.  Nursing note and vitals reviewed.   ED Course  Procedures (including critical care time) Labs Review Labs Reviewed  CBC WITH DIFFERENTIAL/PLATELET - Abnormal; Notable for the following:    WBC 11.2 (*)    Neutrophils Relative % 83 (*)    Neutro Abs 9.2 (*)    All other components within normal limits  COMPREHENSIVE METABOLIC PANEL - Abnormal; Notable for the following:    Glucose, Bld 133 (*)    Creatinine, Ser 1.75 (*)    GFR calc non Af Amer 37 (*)    GFR calc Af Amer 42 (*)    All other components within normal limits  URINALYSIS, ROUTINE W REFLEX MICROSCOPIC (NOT AT [facility]) - Abnormal; Notable for the following:    Ketones, ur 15 (*)    All other components within normal limits  LIPASE, BLOOD  I-STAT TROPOININ, ED    Imaging Review Ct Abdomen Pelvis Wo Contrast  04/14/2015   CLINICAL DATA:  Acute onset of left flank pain, nausea and dysuria. Vomiting. Initial encounter.  EXAM: CT ABDOMEN AND PELVIS WITHOUT CONTRAST  TECHNIQUE: Multidetector CT imaging of the abdomen and pelvis was performed following the standard protocol without IV contrast.  COMPARISON:  CT of the abdomen and pelvis performed 02/04/2015  FINDINGS: Minimal bibasilar atelectasis is noted. Diffuse coronary artery calcifications are seen. The patient is status post median sternotomy.  The liver and spleen  are unremarkable in appearance. The gallbladder is within normal limits. A low-attenuation adrenal adenoma is noted at the right adrenal gland, measuring 1.8 cm in size. A smaller 1.4 cm adrenal adenoma is noted at the left adrenal gland.  There is a hyperdense 2.6 cm cyst at the lateral aspect of the right kidney, which demonstrated no enhancement on the CTA from 2015.  Bilateral perinephric stranding and fluid are seen, more prominent on the left. There is diffuse prominence of the left ureter along its entire course, which appears to reflect an obstructing 2 mm stone in the distal left ureter, just above the left vesicoureteral junction. Minimal left-sided hydronephrosis is seen. A few small nonobstructing left renal stones are seen, measuring up to 3 mm in size.  The patient is status post endovascular repair of an infrarenal abdominal aortic aneurysm. This is difficult to fully assess without contrast, but appears grossly stable in size, with the aneurysm sac measuring up to 6.6 cm in AP dimension and 6.8 cm in transverse dimension. 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  No free fluid is identified. The small bowel is unremarkable in appearance. The stomach is within normal limits. No acute vascular abnormalities are seen.  The appendix is normal in caliber, without evidence of appendicitis. There is apparent mild wall thickening along the proximal sigmoid colon. Would correlate for any evidence of mild chronic inflammation. The colon is otherwise unremarkable.  The bladder is moderately distended and grossly unremarkable. The prostate is enlarged, measuring 5.7 cm in transverse dimension, with scattered calcification. The patient's penile implant is grossly unremarkable in appearance, with associated left-sided reservoir. No inguinal lymphadenopathy is seen.  No acute osseous abnormalities are identified.  IMPRESSION: 1. Minimal left-sided hydronephrosis, with an obstructing 2 mm  stone noted in the distal left ureter, just above the left vesicoureteral junction. Diffuse left-sided perinephric stranding and fluid  noted. 2. Apparent mild wall thickening along the proximal sigmoid colon. Would correlate for any evidence of underlying mild chronic inflammation. 3. Few small nonobstructing left renal stones measure up to 3 mm in size. 4. Hyperdense 2.6 cm right renal cyst again noted. 5. Diffuse coronary artery calcifications seen. 6. Adrenal adenomas incidentally noted within the adrenal glands bilaterally. 7. Status post endovascular repair of infrarenal abdominal aortic aneurysm. This is difficult to fully assess without contrast, but appears stable in size, with the aneurysm sac measuring 6.6 cm in AP dimension and 6.8 cm transverse dimension. 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 8. Enlarged prostate again noted.   Electronically Signed   By: Garald Balding M.D.   On: 04/14/2015 22:56     EKG Interpretation   Date/Time:  Tuesday April 14 2015 22:10:54 EDT Ventricular Rate:  75 PR Interval:  242 QRS Duration: 129 QT Interval:  506 QTC Calculation: 565 R Axis:   0 Text Interpretation:  Atrial flutter with predominant 4:1 AV block Left  ventricular hypertrophy Inferior infarct, possibly acute Lateral leads are  also involved Prolonged QT interval afib new since earlier in the day   Reconfirmed by YAO  MD, DAVID ([id number]) on 04/14/2015 10:19:17 PM      MDM   Final diagnoses:  Left flank pain   KHIREE BUKHARI is a 74 y.o. male here with L flank pain. Likely renal colic. Less likely AAA rupture. Will get labs, UA, CT ab/pel.   11:17 PM UA clear.  CT showed 2 mm distal stone with hydro. Pain improved, doesn't want IV pain meds. Will dc home with zofran, percocet, flomax. Of note, patient was on the monitor and went from sinus to aflutter. Hx of afib on eliquis. He is rate controlled and has no chest pain or shortness of breath so I think doesn't need  further workup in the ED for this.      Wandra Arthurs, MD 04/14/15 [id number]  Wandra Arthurs, MD 04/14/15 [phone]

## 2015-04-14 NOTE — ED Notes (Signed)
Pt in c/o left flank pain since 3am with nausea, difficulty with urination, pt vomited x1, appears uncomfortable

## 2015-04-14 NOTE — Discharge Instructions (Signed)
Take motrin for pain.   Take percocet for severe pain. DO NOT drive with it.   Take flomax daily.   Take zofran for nausea.   Stay hydrated.   Follow up with a urologist.   Return to ER if you have severe pain, vomiting, fever, unable to urinate.

## 2015-04-15 ENCOUNTER — Ambulatory Visit: Payer: Commercial Managed Care - HMO | Admitting: Physician Assistant

## 2015-04-15 ENCOUNTER — Telehealth: Payer: Self-pay | Admitting: Internal Medicine

## 2015-04-15 NOTE — Telephone Encounter (Signed)
Relation to pt: self  Call back number: [phone]   Reason for call:  Pt left voicemail at 8:05am today, pt was experiencing back pain went to urgent care. I contacted pt and pt stated he has kidney stones. ED MD prescribed pain medication and as per pt "Flomax" pt stated if symptoms do not improve he will call to schedule a follow up.

## 2015-04-15 NOTE — Telephone Encounter (Signed)
Thank you for making [place] aware.

## 2015-04-17 ENCOUNTER — Ambulatory Visit (INDEPENDENT_AMBULATORY_CARE_PROVIDER_SITE_OTHER): Payer: Commercial Managed Care - HMO | Admitting: Internal Medicine

## 2015-04-17 ENCOUNTER — Encounter: Payer: Self-pay | Admitting: Internal Medicine

## 2015-04-17 VITALS — BP 132/74 | HR 71 | Temp 98.1°F | Ht 69.0 in | Wt 242.4 lb

## 2015-04-17 DIAGNOSIS — N200 Calculus of kidney: Secondary | ICD-10-CM

## 2015-04-17 LAB — BASIC METABOLIC PANEL
BUN: 26 mg/dL — AB (ref 6–23)
CALCIUM: 8.7 mg/dL (ref 8.4–10.5)
CO2: 26 mEq/L (ref 19–32)
Chloride: 98 mEq/L (ref 96–112)
Creatinine, Ser: 1.88 mg/dL — ABNORMAL HIGH (ref 0.40–1.50)
GFR: 37.38 mL/min — AB (ref >60.00)
Glucose, Bld: 114 mg/dL — ABNORMAL HIGH (ref 70–99)
Potassium: 4.1 mEq/L (ref 3.5–5.1)
Sodium: 131 mEq/L — ABNORMAL LOW (ref 135–145)

## 2015-04-17 MED ORDER — TRAMADOL HCL 50 MG PO TABS: 50.0000 mg | ORAL_TABLET | Freq: Three times a day (TID) | ORAL | Status: DC | PRN

## 2015-04-17 MED ORDER — HYDROCODONE-ACETAMINOPHEN 5-325 MG PO TABS: 1.0000 | ORAL_TABLET | Freq: Three times a day (TID) | ORAL | Status: DC | PRN

## 2015-04-17 MED ORDER — LACTULOSE 10 GM/15ML PO SOLN: 10.0000 g | Freq: Three times a day (TID) | ORAL | Status: DC

## 2015-04-17 NOTE — Progress Notes (Signed)
Pre visit review using our clinic review tool, if applicable. No additional management support is needed unless otherwise documented below in the visit note. 

## 2015-04-17 NOTE — Patient Instructions (Signed)
Get your blood work before you go  Stop milk of magnesia and Anaprox for now  Drink plenty of fluids and strain your urine  Continue Flomax and nausea medication  For pain try either hydrocodone or Ultram, see which one works better without constipation. Do not mix them within 8 hours  Lactulose as needed for constipation  We are referring you to urology ASAP  ER if symptoms severe, fever, chills, swelling, increased abdominal pain, unable to treat constipation  Schedule a physical exam at your convenience

## 2015-04-17 NOTE — Progress Notes (Signed)
Subjective:    Patient ID: Philip [name], male    DOB: 10-16-1941, 74 y.o.   MRN: [id number]  DOS:  04/17/2015 Type of visit - description : er f/u Interval history: Went to the ER 04/14/2015 with left flank pain, workup showed a 2 mm distal stone on the left with hydronephrosis. Dc  home on pain and nausea medication and Flomax Creatinine slightly elevated at 1.75 White count 11.2 No urine culture found CT report:  IMPRESSION: 1. Minimal left-sided hydronephrosis, with an obstructing 2 mm stone noted in the distal left ureter, just above the left vesicoureteral junction. Diffuse left-sided perinephric stranding and fluid noted. 2. Apparent mild wall thickening along the proximal sigmoid colon. Would correlate for any evidence of underlying mild chronic inflammation. 3. Few small nonobstructing left renal stones measure up to 3 mm in size. 4. Hyperdense 2.6 cm right renal cyst again noted. 5. Diffuse coronary artery calcifications seen. 6. Adrenal adenomas incidentally noted within the adrenal glands bilaterally. 7. Status post endovascular repair of infrarenal abdominal aortic aneurysm. This is difficult to fully assess without contrast, but appears stable in size, with the aneurysm sac measuring 6.6 cm in AP dimension and 6.8 cm transverse dimension. 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 8. Enlarged prostate again noted.  Review of Systems Since he left the hospital, appetite is decreased, drinking fluids okay. No fever chills Still has pain at the left flank although is not as severe, he also has lower abdominal discomfort. Some nausea no vomiting No gross hematuria, dysuria or difficulty urinating. This morning, he experienced some discomfort at the penis (wonders if the stone is in the bladder). He used Percocet, 2 tablets yesterday, it is constipating him severely, not better with enemas 2, milk of magnesia.   Past Medical History    Diagnosis Date  . Hyperlipidemia   . CAD (coronary artery disease)     had a MI , s/p CABG  . Skin cancer     several , one of them was melanoma (sees derm routinely)  . OSA (obstructive sleep apnea)     started CPAP 12/09  . Peripheral vascular disease   . AAA (abdominal aortic aneurysm)   . Hypertension     takes Amlodipine,Metoprolol,and Lisinopril daily  . GERD (gastroesophageal reflux disease)     takes Omeprazole daily  . Myocardial infarction     several with last one being 2001(but never knew about any except 2001)  . Dysrhythmia     paroximal a fib  . Pneumonia     as a child  . Tingling     feet occasionally  . Joint pain   . Joint swelling   . History of colon polyps   . History of kidney stones   . Cataracts, bilateral     immature  . History of shingles   . Tinnitus   . Atrial fibrillation   . CHF (congestive heart failure)     Past Surgical History  Procedure Laterality Date  . Penile prosthesis implant  08/2005  . Rhinoplasty  1975  . Coronary artery bypass graft  1999    x 3 vessels  . Cardiac catheterization  2015  . Colonoscopy    . Abdominal aortic endovascular stent graft N/A 07/08/2014    Procedure: ABDOMINAL AORTIC ENDOVASCULAR STENT GRAFT/ GORE;  Surgeon: Angelia Mould, MD;  Location: [facility];  Service: Vascular;  Laterality: N/A;  . Left heart catheterization with coronary/graft angiogram  N/A 05/29/2014    Procedure: LEFT HEART CATHETERIZATION WITH [name];  Surgeon: [name], MD;  Location: [facility] CATH LAB;  Service: Cardiovascular;  Laterality: N/A;  . Abdominal aortagram N/A 06/16/2014    Procedure: ABDOMINAL AORTAGRAM;  Surgeon: Angelia Mould, MD;  Location: [facility] CATH LAB;  Service: Cardiovascular;  Laterality: N/A;    History   Social History  . Marital Status: Married    Spouse Name: N/A  . Number of Children: 3  . Years of Education: N/A   Occupational History  . retired, Actor    Social  History Main Topics  . Smoking status: Former Research scientist (life sciences)  . Smokeless tobacco: Never Used     Comment: quit smoking in 1979  . Alcohol Use: Yes     Comment: occasionally  . Drug Use: No  . Sexual Activity: Not Currently   Other Topics Concern  . Not on file   Social History Narrative   1 son, 2 step children        Medication List       This list is accurate as of: 04/17/15 11:59 PM.  Always use your most recent med list.               amLODipine 10 MG tablet  Commonly known as:  NORVASC  Take 1 tablet (10 mg total) by mouth daily.     atorvastatin 40 MG tablet  Commonly known as:  LIPITOR  TAKE 1 TABLET EVERY DAY     B-COMPLEX PO  Take 1 tablet by mouth daily.     co-enzyme Q-10 30 MG capsule  Take 30 mg by mouth daily.     ELIQUIS 5 MG Tabs tablet  Generic drug:  apixaban  TAKE ONE TABLET BY MOUTH TWICE DAILY     fenofibrate 48 MG tablet  Commonly known as:  TRICOR  Take 1 tablet (48 mg total) by mouth daily.     Fish Oil 1200 MG Caps  Take 1,200 mg by mouth daily.     HYDROcodone-acetaminophen 5-325 MG per tablet  Commonly known as:  NORCO/VICODIN  Take 1-2 tablets by mouth every 8 (eight) hours as needed.     lactulose 10 GM/15ML solution  Commonly known as:  CHRONULAC  Take 15-30 mLs (10-20 g total) by mouth 3 (three) times daily.     lisinopril 20 MG tablet  Commonly known as:  PRINIVIL,ZESTRIL  Take 0.5 tablets (10 mg total) by mouth daily.     magnesium oxide 400 MG tablet  Commonly known as:  MAG-OX  Take 800 mg by mouth daily.     metoprolol succinate 100 MG 24 hr tablet  Commonly known as:  TOPROL-XL  TAKE 1 TABLET DAILY. TAKE WITH OR IMMEDIATELY FOLLOWING A MEAL.     multivitamin with minerals tablet  Take 1 tablet by mouth daily.     naproxen sodium 220 MG tablet  Commonly known as:  ANAPROX  Take 440 mg by mouth 2 (two) times daily as needed (FOR PAIN).     omeprazole 20 MG capsule  Commonly known as:  PRILOSEC  Take 1 capsule  (20 mg total) by mouth daily.     ondansetron 4 MG disintegrating tablet  Commonly known as:  ZOFRAN ODT  4mg  ODT q4 hours prn nausea/vomit     oxyCODONE-acetaminophen 5-325 MG per tablet  Commonly known as:  PERCOCET  Take 1-2 tablets by mouth every 6 (six) hours as needed.  PROBIOTIC DAILY Caps  Take 1 capsule by mouth daily.     tamsulosin 0.4 MG Caps capsule  Commonly known as:  FLOMAX  Take 1 capsule (0.4 mg total) by mouth daily.     traMADol 50 MG tablet  Commonly known as:  ULTRAM  Take 1 tablet (50 mg total) by mouth every 8 (eight) hours as needed.     Vitamin D3 5000 UNITS Caps  Take 5,000 Units by mouth daily.           Objective:   Physical Exam BP 132/74 mmHg  Pulse 71  Temp(Src) 98.1 F (36.7 C) (Oral)  Ht 5\' 9"  (1.753 m)  Wt 242 lb 6 oz (109.941 kg)  BMI 35.78 kg/m2  SpO2 97% General:   Well developed, well nourished . NAD.  HEENT:  Normocephalic . Face symmetric, atraumatic Lungs:  CTA B Normal respiratory effort, no intercostal retractions, no accessory muscle use. Heart: RRR,  no murmur.  no pretibial edema bilaterally  Abdomen:  Not distended, soft, mild tenderness of the lower abdomen but no increased bowel sounds, rigidity,mass,organomegaly + Left CVA tenderness  Skin: Not pale. Not jaundice Neurologic:  alert & oriented X3.  Speech normal, gait appropriate for age and unassisted Psych--  Cognition and judgment appear intact.  Cooperative with normal attention span and concentration.  Behavior appropriate. No anxious or depressed appearing.       Assessment & Plan:    Urolithiasis with  left hydronephrosis and increased creatinine. The patient has had distal LEFT  2 mm stone. Continue with symptoms although pain has not been excruciating in the last 24 hours. Intolerant to oxycodone due to severe constipation Plan: Refer to urology ASAP Continue Flomax Discontinue oxycodone, try hydrocodone or tramadol, see  instructions BMP Strain the urine Avoid milk of magnesia or Anaprox as kidney function is decreased Lactulose as needed for constipation ER if symptoms severe  Mild colonic abnormality per CT, see report, he is asymptomatic and up-to-date on colonoscopies

## 2015-04-20 ENCOUNTER — Telehealth: Payer: Self-pay | Admitting: Cardiology

## 2015-04-20 DIAGNOSIS — L821 Other seborrheic keratosis: Secondary | ICD-10-CM | POA: Diagnosis not present

## 2015-04-20 DIAGNOSIS — L570 Actinic keratosis: Secondary | ICD-10-CM | POA: Diagnosis not present

## 2015-04-20 DIAGNOSIS — D225 Melanocytic nevi of trunk: Secondary | ICD-10-CM | POA: Diagnosis not present

## 2015-04-20 DIAGNOSIS — L814 Other melanin hyperpigmentation: Secondary | ICD-10-CM | POA: Diagnosis not present

## 2015-04-20 NOTE — Telephone Encounter (Signed)
New message ° ° ° ° ° ° °Calling to get lab results °

## 2015-04-20 NOTE — Telephone Encounter (Signed)
Spoke with patient about lab done 04/13/15

## 2015-04-21 DIAGNOSIS — N201 Calculus of ureter: Secondary | ICD-10-CM | POA: Diagnosis not present

## 2015-04-21 DIAGNOSIS — N200 Calculus of kidney: Secondary | ICD-10-CM | POA: Diagnosis not present

## 2015-04-24 ENCOUNTER — Telehealth: Payer: Self-pay | Admitting: Internal Medicine

## 2015-04-24 DIAGNOSIS — N200 Calculus of kidney: Secondary | ICD-10-CM

## 2015-04-24 NOTE — Telephone Encounter (Signed)
Note from urology reviewed, they feel that the patient already passed the stone. The last time we checked, his creatinine was elevated. Please arrange a BMP to be done at some point this week --DX kidney stones

## 2015-04-27 ENCOUNTER — Other Ambulatory Visit: Payer: Self-pay

## 2015-04-27 MED ORDER — METOPROLOL SUCCINATE ER 100 MG PO TB24: ORAL_TABLET | ORAL | Status: DC

## 2015-04-27 NOTE — Telephone Encounter (Signed)
LMOM at home number informing Pt to return call.

## 2015-04-27 NOTE — Telephone Encounter (Signed)
Pt returning your call best # [phone]

## 2015-04-28 NOTE — Telephone Encounter (Signed)
LMOM informing Pt to return call.  

## 2015-04-29 NOTE — Telephone Encounter (Signed)
Please send him a letter

## 2015-04-29 NOTE — Telephone Encounter (Signed)
Have been unable to get in contact with Pt, please advise.

## 2015-04-29 NOTE — Telephone Encounter (Signed)
Letter printed and mailed to Pt.  

## 2015-05-08 ENCOUNTER — Other Ambulatory Visit (INDEPENDENT_AMBULATORY_CARE_PROVIDER_SITE_OTHER): Payer: Commercial Managed Care - HMO

## 2015-05-08 DIAGNOSIS — N200 Calculus of kidney: Secondary | ICD-10-CM | POA: Diagnosis not present

## 2015-05-08 LAB — BASIC METABOLIC PANEL
BUN: 21 mg/dL (ref 6–23)
CALCIUM: 9.2 mg/dL (ref 8.4–10.5)
CO2: 30 mEq/L (ref 19–32)
Chloride: 104 mEq/L (ref 96–112)
Creatinine, Ser: 1.18 mg/dL (ref 0.40–1.50)
GFR: 63.97 mL/min (ref >60.00)
Glucose, Bld: 98 mg/dL (ref 70–99)
Potassium: 4.5 mEq/L (ref 3.5–5.1)
Sodium: 140 mEq/L (ref 135–145)

## 2015-05-29 DIAGNOSIS — H40003 Preglaucoma, unspecified, bilateral: Secondary | ICD-10-CM | POA: Diagnosis not present

## 2015-06-10 ENCOUNTER — Other Ambulatory Visit: Payer: Self-pay

## 2015-06-10 MED ORDER — ATORVASTATIN CALCIUM 40 MG PO TABS: ORAL_TABLET | ORAL | Status: DC

## 2015-06-18 ENCOUNTER — Other Ambulatory Visit: Payer: Self-pay

## 2015-06-19 ENCOUNTER — Ambulatory Visit (INDEPENDENT_AMBULATORY_CARE_PROVIDER_SITE_OTHER): Payer: Commercial Managed Care - HMO | Admitting: Internal Medicine

## 2015-06-19 ENCOUNTER — Encounter: Payer: Self-pay | Admitting: Internal Medicine

## 2015-06-19 VITALS — BP 128/76 | HR 53 | Temp 97.6°F | Ht 69.0 in | Wt 237.5 lb

## 2015-06-19 DIAGNOSIS — Z125 Encounter for screening for malignant neoplasm of prostate: Secondary | ICD-10-CM

## 2015-06-19 DIAGNOSIS — Z0000 Encounter for general adult medical examination without abnormal findings: Secondary | ICD-10-CM | POA: Diagnosis not present

## 2015-06-19 NOTE — Assessment & Plan Note (Addendum)
Td -- 2015 pneumonia shot 2006 and 2011 prevnar --2015 zostavax-- benefits discussed before and a Rx was provided  CCS: Several Cscopes, had one in 2007 aprox. polyps x 6 ,  03-2009, hyperplastic polyps, 09-2014: polyps, pt told 5 years He has a family history of prostate cancer, DRE normal, check PSA. Diet and exercise discussed

## 2015-06-19 NOTE — Patient Instructions (Signed)
Get your blood work before you leave    Please consider visit these websites for more information:  www.begintheconversation.org  theconversationproject.org    Fall Prevention and Home Safety Falls cause injuries and can affect all age groups. It is possible to use preventive measures to significantly decrease the likelihood of falls. There are many simple measures which can make your home safer and prevent falls. OUTDOORS  Repair cracks and edges of walkways and driveways.  Remove high doorway thresholds.  Trim shrubbery on the main path into your home.  Have good outside lighting.  Clear walkways of tools, rocks, debris, and clutter.  Check that handrails are not broken and are securely fastened. Both sides of steps should have handrails.  Have leaves, snow, and ice cleared regularly.  Use sand or salt on walkways during winter months.  In the garage, clean up grease or oil spills. BATHROOM  Install night lights.  Install grab bars by the toilet and in the tub and shower.  Use non-skid mats or decals in the tub or shower.  Place a plastic non-slip stool in the shower to sit on, if needed.  Keep floors dry and clean up all water on the floor immediately.  Remove soap buildup in the tub or shower on a regular basis.  Secure bath mats with non-slip, double-sided rug tape.  Remove throw rugs and tripping hazards from the floors. BEDROOMS  Install night lights.  Make sure a bedside light is easy to reach.  Do not use oversized bedding.  Keep a telephone by your bedside.  Have a firm chair with side arms to use for getting dressed.  Remove throw rugs and tripping hazards from the floor. KITCHEN  Keep handles on pots and pans turned toward the center of the stove. Use back burners when possible.  Clean up spills quickly and allow time for drying.  Avoid walking on wet floors.  Avoid hot utensils and knives.  Position shelves so they are not too high  or low.  Place commonly used objects within easy reach.  If necessary, use a sturdy step stool with a grab bar when reaching.  Keep electrical cables out of the way.  Do not use floor polish or wax that makes floors slippery. If you must use wax, use non-skid floor wax.  Remove throw rugs and tripping hazards from the floor. STAIRWAYS  Never leave objects on stairs.  Place handrails on both sides of stairways and use them. Fix any loose handrails. Make sure handrails on both sides of the stairways are as long as the stairs.  Check carpeting to make sure it is firmly attached along stairs. Make repairs to worn or loose carpet promptly.  Avoid placing throw rugs at the top or bottom of stairways, or properly secure the rug with carpet tape to prevent slippage. Get rid of throw rugs, if possible.  Have an electrician put in a light switch at the top and bottom of the stairs. OTHER FALL PREVENTION TIPS  Wear low-heel or rubber-soled shoes that are supportive and fit well. Wear closed toe shoes.  When using a stepladder, make sure it is fully opened and both spreaders are firmly locked. Do not climb a closed stepladder.  Add color or contrast paint or tape to grab bars and handrails in your home. Place contrasting color strips on first and last steps.  Learn and use mobility aids as needed. Install an electrical emergency response system.  Turn on lights to avoid dark areas.   Replace light bulbs that burn out immediately. Get light switches that glow.  Arrange furniture to create clear pathways. Keep furniture in the same place.  Firmly attach carpet with non-skid or double-sided tape.  Eliminate uneven floor surfaces.  Select a carpet pattern that does not visually hide the edge of steps.  Be aware of all pets. OTHER HOME SAFETY TIPS  Set the water temperature for 120 F (48.8 C).  Keep emergency numbers on or near the telephone.  Keep smoke detectors on every level of  the home and near sleeping areas. Document Released: 10/07/2002 Document Revised: 04/17/2012 Document Reviewed: 01/06/2012 ExitCare Patient Information 2015 [employer]. This information is not intended to replace advice given to you by your health care provider. Make sure you discuss any questions you have with your health care provider.   Preventive Care for Adults Ages 65 and over  Blood pressure check.** / Every 1 to 2 years.  Lipid and cholesterol check.**/ Every 5 years beginning at age 20.  Lung cancer screening. / Every year if you are aged 55-80 years and have a 30-pack-year history of smoking and currently smoke or have quit within the past 15 years. Yearly screening is stopped once you have quit smoking for at least 15 years or develop a health problem that would prevent you from having lung cancer treatment.  Fecal occult blood test (FOBT) of stool. / Every year beginning at age 50 and continuing until age 75. You may not have to do this test if you get a colonoscopy every 10 years.  Flexible sigmoidoscopy** or colonoscopy.** / Every 5 years for a flexible sigmoidoscopy or every 10 years for a colonoscopy beginning at age 50 and continuing until age 75.  Hepatitis C blood test.** / For all people born from 1945 through 1965 and any individual with known risks for hepatitis C.  Abdominal aortic aneurysm (AAA) screening.** / A one-time screening for ages 65 to 75 years who are current or former smokers.  Skin self-exam. / Monthly.  Influenza vaccine. / Every year.  Tetanus, diphtheria, and acellular pertussis (Tdap/Td) vaccine.** / 1 dose of Td every 10 years.  Varicella vaccine.** / Consult your health care provider.  Zoster vaccine.** / 1 dose for adults aged 60 years or older.  Pneumococcal 13-valent conjugate (PCV13) vaccine.** / Consult your health care provider.  Pneumococcal polysaccharide (PPSV23) vaccine.** / 1 dose for all adults aged 65 years and  older.  Meningococcal vaccine.** / Consult your health care provider.  Hepatitis A vaccine.** / Consult your health care provider.  Hepatitis B vaccine.** / Consult your health care provider.  Haemophilus influenzae type b (Hib) vaccine.** / Consult your health care provider. **Family history and personal history of risk and conditions may change your health care provider's recommendations. Document Released: 12/13/2001 Document Revised: 10/22/2013 Document Reviewed: 03/14/2011 ExitCare Patient Information 2015 [employer]. This information is not intended to replace advice given to you by your health care provider. Make sure you discuss any questions you have with your health care provider.   

## 2015-06-19 NOTE — Progress Notes (Signed)
Subjective:    Patient ID: Philip [name], male    DOB: [birth year], 74 y.o.   MRN: [id number]  DOS:  06/19/2015 Type of visit - description :  Here for Medicare AWV: 1. Risk factors based on Past M, S, F history: reviewed 2. Physical Activities:  Some walking, elliptical  3. Depression/mood: neg screening  4. Hearing:  slt decreased, does not affect ADL, + tinnitus x years, was eval before by a hearing specialist, sx stable 5. ADL's: independent, drives  6. Fall Risk: no recent falls, prevention discussed , see AVS 7. home Safety: does feel safe at home  8. Height, weight, & visual acuity: see VS, sees eye doctor regularly (twice this year, glaucoma suspect) 9. Counseling: provided 10. Labs ordered based on risk factors: if needed  11. Referral Coordination: if needed 12. Care Plan, see assessment and plan , written personalized plan provided , see AVS 13. Cognitive Assessment: motor skills and cognition appropriate for age 31. Care team updated  15. End-of-life care, rec to get a HC-POA  In addition, today we discussed the following: Several days ago, developed pain at the right groin suddenly while playing golf, the area was swollen, and the next day, he saw a bruise, took naproxen prn ; area is looking better, less swollen.(Recommend not to take naproxen in the future as he is anticoagulated) Hypertension: Good compliance with medication, ambulatory BPs usually within normal High cholesterol, good compliance of medication History of atrial fibrillation, anticoagulated. History of kidney stone: Note from urology reviewed.   Review of Systems  Constitutional: No fever. No chills. No unexplained wt changes. No unusual sweats  HEENT: No dental problems, no ear discharge, no facial swelling, no voice changes. No eye discharge, no eye  redness , no  intolerance to light   Respiratory: No wheezing , no  difficulty breathing. No cough , no mucus production  Cardiovascular: No CP,  no leg swelling , no  Palpitations  GI: no nausea, no vomiting, no diarrhea , no  abdominal pain.  No blood in the stools. No dysphagia, no odynophagia    Endocrine: No polyphagia, no polyuria , no polydipsia  GU: No dysuria, gross hematuria, difficulty urinating. No urinary urgency, no frequency.  Musculoskeletal: No joint swellings or unusual aches or pains other than what is described at the history of present illness  Skin: No change in the color of the skin, palor , no  Rash  Allergic, immunologic: No environmental allergies , no  food allergies  Neurological: No dizziness no  syncope. No headaches. No diplopia, no slurred, no slurred speech, no motor deficits, no facial  Numbness  Hematological: No enlarged lymph nodes, no easy bruising , no unusual bleedings  Psychiatry: No suicidal ideas, no hallucinations, no beavior problems, no confusion.  No unusual/severe anxiety, no depression  Past Medical History  Diagnosis Date  . Hyperlipidemia   . CAD (coronary artery disease)     had a MI , s/p CABG  . Skin cancer     several , one of them was melanoma (sees derm routinely)  . OSA (obstructive sleep apnea)     started CPAP 12/09  . Peripheral vascular disease   . AAA (abdominal aortic aneurysm)   . Hypertension     takes Amlodipine,Metoprolol,and Lisinopril daily  . GERD (gastroesophageal reflux disease)     takes Omeprazole daily  . Myocardial infarction     several with last one being 2001(but never knew about any except 2001)  .  Dysrhythmia     paroximal a fib  . Pneumonia     as a child  . Tingling     feet occasionally  . Joint pain   . Joint swelling   . History of colon polyps   . History of kidney stones   . Cataracts, bilateral     immature  . History of shingles   . Tinnitus   . Atrial fibrillation   . CHF (congestive heart failure)     Past Surgical History  Procedure Laterality Date  . Penile prosthesis implant  08/2005  . Rhinoplasty  1975    . Coronary artery bypass graft  1999    x 3 vessels  . Cardiac catheterization  2015  . Colonoscopy    . Abdominal aortic endovascular stent graft N/A 07/08/2014    Procedure: ABDOMINAL AORTIC ENDOVASCULAR STENT GRAFT/ GORE;  Surgeon: Angelia Mould, MD;  Location: [facility];  Service: Vascular;  Laterality: N/A;  . Left heart catheterization with coronary/graft angiogram N/A 05/29/2014    Procedure: LEFT HEART CATHETERIZATION WITH [name];  Surgeon: [name], MD;  Location: [facility] CATH LAB;  Service: Cardiovascular;  Laterality: N/A;  . Abdominal aortagram N/A 06/16/2014    Procedure: ABDOMINAL AORTAGRAM;  Surgeon: Angelia Mould, MD;  Location: [facility] CATH LAB;  Service: Cardiovascular;  Laterality: N/A;    Social History   Social History  . Marital Status: Married    Spouse Name: N/A  . Number of Children: 3  . Years of Education: N/A   Occupational History  . retired, Actor    Social History Main Topics  . Smoking status: Former Research scientist (life sciences)  . Smokeless tobacco: Never Used     Comment: quit smoking in 1979  . Alcohol Use: Yes     Comment: occasionally  . Drug Use: No  . Sexual Activity: Not Currently   Other Topics Concern  . Not on file   Social History Narrative   Lives w/ wife 1 son, 2 step children    Family history: Has an colon cancer Father had prostate cancer at age 59    Medication List       This list is accurate as of: 06/19/15 11:59 PM.  Always use your most recent med list.               amLODipine 10 MG tablet  Commonly known as:  NORVASC  Take 1 tablet (10 mg total) by mouth daily.     atorvastatin 40 MG tablet  Commonly known as:  LIPITOR  TAKE 1 TABLET EVERY DAY  AT BEDTIME     B-COMPLEX PO  Take 1 tablet by mouth daily.     co-enzyme Q-10 30 MG capsule  Take 30 mg by mouth daily.     ELIQUIS 5 MG Tabs tablet  Generic drug:  apixaban  TAKE ONE TABLET BY MOUTH TWICE DAILY     fenofibrate 48 MG tablet   Commonly known as:  TRICOR  Take 1 tablet (48 mg total) by mouth daily.     Fish Oil 1200 MG Caps  Take 1,200 mg by mouth daily.     lisinopril 20 MG tablet  Commonly known as:  PRINIVIL,ZESTRIL  Take 0.5 tablets (10 mg total) by mouth daily.     magnesium oxide 400 MG tablet  Commonly known as:  MAG-OX  Take 800 mg by mouth daily.     metoprolol succinate 100 MG 24 hr tablet  Commonly  known as:  TOPROL-XL  TAKE 1 TABLET DAILY. TAKE WITH OR IMMEDIATELY FOLLOWING A MEAL.     multivitamin with minerals tablet  Take 1 tablet by mouth daily.     omeprazole 20 MG capsule  Commonly known as:  PRILOSEC  Take 1 capsule (20 mg total) by mouth daily.     PROBIOTIC DAILY Caps  Take 1 capsule by mouth daily.     Vitamin D3 5000 UNITS Caps  Take 5,000 Units by mouth daily.           Objective:   Physical Exam  Musculoskeletal:       Legs:  BP 128/76 mmHg  Pulse 53  Temp(Src) 97.6 F (36.4 C) (Oral)  Ht 5\' 9"  (1.753 m)  Wt 237 lb 8 oz (107.729 kg)  BMI 35.06 kg/m2  SpO2 95% General:   Well developed, well nourished . NAD.  Neck:   No  thyromegaly  HEENT:  Normocephalic . Face symmetric, atraumatic Lungs:  CTA B Normal respiratory effort, no intercostal retractions, no accessory muscle use. Heart: RRR,  no murmur.  No pretibial edema bilaterally  Abdomen:  Not distended, soft, non-tender. No rebound or rigidity. Rectal:  External abnormalities: none. Normal sphincter tone. No rectal masses or tenderness.  Stool brown  Prostate: Prostate gland firm and smooth, no enlargement, nodularity, tenderness, mass, asymmetry or induration.  Penis and scrotal contents: Has a penile implant otherwise examination is normal Skin: Exposed areas without rash. Not pale. Not jaundice Neurologic:  alert & oriented X3.  Speech normal, gait appropriate for age and unassisted Strength symmetric and appropriate for age.  Psych: Cognition and judgment appear intact.  Cooperative  with normal attention span and concentration.  Behavior appropriate. No anxious or depressed appearing.    Assessment & Plan:   MSK: Pain at the right groin likely due to a injury while playing golf, small muscle tear?. At that time did not have any abdominal or back pain. He is getting better, recommend observation  Kidney stone: Saw urology, he passed a stone, he has 1 very small left in the kidney, recommend observation  Hypertension: Recent BMP satisfactory, BP well-controlled, continue with present care  Hyperlipidemia: Recent FLP satisfactory. No change  History of skin cancer, sees Dr. [name] regularly

## 2015-06-19 NOTE — Progress Notes (Signed)
Pre visit review using our clinic review tool, if applicable. No additional management support is needed unless otherwise documented below in the visit note. 

## 2015-06-20 LAB — PSA: PSA: 3.39 ng/mL (ref <=4.00)

## 2015-07-28 ENCOUNTER — Encounter: Payer: Commercial Managed Care - HMO | Admitting: Internal Medicine

## 2015-09-16 ENCOUNTER — Ambulatory Visit (INDEPENDENT_AMBULATORY_CARE_PROVIDER_SITE_OTHER): Payer: Commercial Managed Care - HMO

## 2015-09-16 DIAGNOSIS — Z23 Encounter for immunization: Secondary | ICD-10-CM

## 2015-09-19 ENCOUNTER — Other Ambulatory Visit: Payer: Self-pay | Admitting: Cardiology

## 2015-11-04 DIAGNOSIS — L570 Actinic keratosis: Secondary | ICD-10-CM | POA: Diagnosis not present

## 2015-11-05 ENCOUNTER — Telehealth: Payer: Self-pay | Admitting: Cardiology

## 2015-11-05 MED ORDER — LISINOPRIL 20 MG PO TABS: 10.0000 mg | ORAL_TABLET | Freq: Every day | ORAL | Status: DC

## 2015-11-05 NOTE — Telephone Encounter (Signed)
°*  STAT* If patient is at the pharmacy, call can be transferred to refill team.   1. Which medications need to be refilled? (please list name of each medication and dose if known) Lisinopril 20mg   2. Which pharmacy/location (including street and city if local pharmacy) is medication to be sent to?[facility]/  3. Do they need a 30 day or 90 day supply? 90 day

## 2015-11-05 NOTE — Telephone Encounter (Signed)
Pt's Rx was sent to pt's pharmacy as requested. Confirmation received.  °

## 2015-11-12 ENCOUNTER — Telehealth: Payer: Self-pay | Admitting: Internal Medicine

## 2015-11-12 DIAGNOSIS — Z0100 Encounter for examination of eyes and vision without abnormal findings: Secondary | ICD-10-CM

## 2015-11-12 NOTE — Telephone Encounter (Signed)
Pt needing referral to Dr. Peter Garter at [facility] in [place] for annual eye exam. Pt states he called last week. I do not see documentation of call. Appt scheduled for 11/13/15 10:30am.

## 2015-11-12 NOTE — Telephone Encounter (Signed)
scheduled

## 2015-11-12 NOTE — Telephone Encounter (Signed)
Referral placed, Pt is due for 6 month F/U next month. Please schedule at Pt's convenience. Thank you.

## 2015-11-13 DIAGNOSIS — H40012 Open angle with borderline findings, low risk, left eye: Secondary | ICD-10-CM | POA: Diagnosis not present

## 2015-11-13 DIAGNOSIS — H2513 Age-related nuclear cataract, bilateral: Secondary | ICD-10-CM | POA: Diagnosis not present

## 2015-11-13 DIAGNOSIS — H40011 Open angle with borderline findings, low risk, right eye: Secondary | ICD-10-CM | POA: Diagnosis not present

## 2015-11-13 DIAGNOSIS — H25013 Cortical age-related cataract, bilateral: Secondary | ICD-10-CM | POA: Diagnosis not present

## 2015-12-11 ENCOUNTER — Encounter: Payer: Self-pay | Admitting: Cardiology

## 2015-12-11 ENCOUNTER — Ambulatory Visit (INDEPENDENT_AMBULATORY_CARE_PROVIDER_SITE_OTHER): Payer: Commercial Managed Care - HMO | Admitting: Cardiology

## 2015-12-11 VITALS — BP 142/90 | HR 64 | Ht 69.0 in | Wt 224.0 lb

## 2015-12-11 DIAGNOSIS — I2510 Atherosclerotic heart disease of native coronary artery without angina pectoris: Secondary | ICD-10-CM | POA: Diagnosis not present

## 2015-12-11 DIAGNOSIS — I480 Paroxysmal atrial fibrillation: Secondary | ICD-10-CM

## 2015-12-11 NOTE — Patient Instructions (Signed)
Medication Instructions:   Your physician recommends that you continue on your current medications as directed. Please refer to the Current Medication list given to you today.   Labwork: BMET/CBCd on Monday February 13,2017 The lab is open from 7:30AM -5PM.  Testing/Procedures: none  Follow-Up:  Your physician wants you to follow-up in: 6 months with Dr [name] Dubin. (August 2017).  You will receive a reminder letter in the mail two months in advance. If you don't receive a letter, please call our office to schedule the follow-up appointment.   Any Other Special Instructions Will Be Listed Below (If Applicable).     If you need a refill on your cardiac medications before your next appointment, please call your pharmacy.

## 2015-12-13 NOTE — Progress Notes (Signed)
Patient ID: Philip [name], male   DOB: 25-May-1941, 75 y.o.   MRN: [id number]  PCP: Dr. Larose Kells  75 yo with history of CAD s/p CABG in 1999 presents for cardiology followup.  He noted a prominent abdominal pulsation when using his inversion table and told his PCP about this.  Therefore, he had an abdominal ultrasound with a 7 cm AAA noted. He was seen by [name] for discussion of repair and then referred back to this office for pre-operative evaluation.  He was seen by the PA in this office in 7/15, and at that appointment was noted to be in atrial fibrillation with controlled rate.  He had not noticed any palpitations or change in his baseline symptoms. He was not started on anticoagulation given plan for AAA surgery in the near future.  At subsequent appointments (including today), he has been in NSR.  He was sent for a pre-op Lexiscan Cardiolite in 7/15. This study was intermediate risk, showing a reversible anterior and apical perfusion defect and a fixed basal to mid inferior defect with EF 48%.  Echo was done to reassess EF.  This showed EF 55-60%.  I did LHC in 7/15 given abnormal Cardiolite.  This showed patent grafts.  In [id number], he had uncomplicated endovascular repair of his AAA. 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   Currently, he is doing well.  No exertional dyspnea or chest pain.  He golfs twice a week.  He is in NSR today and has had no palpitations.  No BRBPR or melena on Eliquis.  BP elevated today but he checks at home and it is always within normal range.   ECG (05/07/14): Atrial fibrillation with PVCs ECG (05/20/14): NSR, RAE ECG (06/18/14): NSR, RAE, IVCD ECG (08/04/14): NSR, LVH, old inferior MI ECG (4/16): NSR, LVH ECG (1/17): NSR, LVH, inferior Qs  Labs (11/13): K 4.5, creatinine 1.1, LDL 74, HDL 36 Labs (8/14): K 4.4, creatinine 1.2, LDL 53, HDL 36 Labs (1/15): LDL 54, HDL 37 Labs (6/15): K 4.6, creatinine 1.6 Labs (8/15): K 3.9,  creatinine 1.1 Labs (9/15): K 4.5, creatinine 0.95, HCT 38.7 Labs (1/16): HCT 45.2, K 4.5, creatinine 1.2, HDL 35, TGs 321, LDL 63 Labs (6/16): LDL 68, HDL 36 Labs (7/16): K 4.5, creatinine 1.18  PMH: 1. Hyperlipidemia 2. HTN 3. CAD s/p CABG 1999 with LIMA-LAD, SVG-LCx, SVG-RCA.  [place] 2009 with 3/3 grafts patent. Lexiscan Cardiolite (7/15) with EF 48%, reversible anterior and apical perfusion defect and a fixed basal to mid inferior defect.  Echo (7/15) with EF 55-60%, mild LAE.  LHC (7/15) with total occlusion of LM and RCA; SVG-OM patent, SVG-RCA patent, LIMA-LAD patent.  4. H/o melanoma 5. OSA on CPAP 6. AAA: s/p endovascular repair in 9/15. Type II endoleak noted . 7. Atrial fibrillation: Paroxysmal, 1st noted in 7/15.  8. CKD  SH: Retired, married, 3 kids, quit smoking in 1978.  Lives in [place].   FH: Mother with CAD.   ROS: All systems reviewed and negative except as per HPI.   Current Outpatient Prescriptions  Medication Sig Dispense Refill  . amLODipine (NORVASC) 10 MG tablet Take 1 tablet (10 mg total) by mouth daily. 180 tablet 1  . atorvastatin (LIPITOR) 40 MG tablet TAKE 1 TABLET EVERY DAY  AT BEDTIME 90 tablet 2  . B Complex-Biotin-FA (B-COMPLEX PO) Take 1 tablet by mouth daily.    . Cholecalciferol (VITAMIN D3) 5000 UNITS  CAPS Take 5,000 Units by mouth daily.    Marland Kitchen co-enzyme Q-10 30 MG capsule Take 30 mg by mouth daily.    Marland Kitchen ELIQUIS 5 MG TABS tablet TAKE ONE TABLET BY MOUTH TWICE DAILY 60 tablet 2  . fenofibrate (TRICOR) 48 MG tablet Take 1 tablet (48 mg total) by mouth daily. 90 tablet 3  . lisinopril (PRINIVIL,ZESTRIL) 20 MG tablet Take 0.5 tablets (10 mg total) by mouth daily. 90 tablet 0  . magnesium oxide (MAG-OX) 400 MG tablet Take 800 mg by mouth daily.    . metoprolol succinate (TOPROL-XL) 100 MG 24 hr tablet TAKE 1 TABLET DAILY. TAKE WITH OR IMMEDIATELY FOLLOWING A MEAL. 90 tablet 2  . Multiple Vitamins-Minerals (MULTIVITAMIN WITH MINERALS) tablet Take  1 tablet by mouth daily.    . Omega-3 Fatty Acids (FISH OIL) 1200 MG CAPS Take 1,200 mg by mouth daily.    Marland Kitchen omeprazole (PRILOSEC) 20 MG capsule Take 1 capsule (20 mg total) by mouth daily. 90 capsule 3  . Probiotic Product (PROBIOTIC DAILY) CAPS Take 1 capsule by mouth daily.     No current facility-administered medications for this visit.    BP 142/90 mmHg  Pulse 64  Ht 5\' 9"  (1.753 m)  Wt 224 lb (101.606 kg)  BMI 33.06 kg/m2 General: NAD Neck: No JVD, no thyromegaly or thyroid nodule.  Lungs: Clear to auscultation bilaterally with normal respiratory effort. CV: Nondisplaced PMI.  Heart regular S1/S2, no S3/S4, no murmur.  No edema.  No carotid bruit.  Normal pedal pulses.  Abdomen: Soft, nontender, no hepatosplenomegaly, no distention.  Skin: Intact without lesions or rashes.  Neurologic: Alert and oriented x 3.  Psych: Normal affect. Extremities: No clubbing or cyanosis.   Assessment/Plan 1. CAD: s/p CABG in 1999. He was set up for [employer] prior to vascular surgery (AAA repair) and was found to have an intermediate risk study with anterior and apical ischemia.  LHC in 7/15 showed patent grafts, no intervention needed.  He has no ischemic symptoms.  - Continue current regimen of statin, ACEI, and Toprol XL.  - No ASA given Eliquis use and stable CAD.  2. HTN: Mild BP elevation today but BP has been normal when he checks at home.  3. Hyperlipidemia: Continue atorvastatin, good LDL recently.  TGs better on fenofibrate. 4. AAA: s/p uncomplicated endovascular repair in 9/15.  Has type II endoleak followed by [name]. Most recent CT in 4/16 did not show endoleak.  5. Atrial fibrillation: Paroxysmal.  He is in NSR today.  CHADSVASC = 3.  He has been on Eliquis, will continue.  CBC/BMET today.    Followup in 6 months.    [name] 12/13/2015

## 2015-12-14 ENCOUNTER — Other Ambulatory Visit: Payer: Commercial Managed Care - HMO | Admitting: *Deleted

## 2015-12-14 ENCOUNTER — Encounter: Payer: Self-pay | Admitting: Cardiology

## 2015-12-14 DIAGNOSIS — I480 Paroxysmal atrial fibrillation: Secondary | ICD-10-CM | POA: Diagnosis not present

## 2015-12-14 DIAGNOSIS — I2510 Atherosclerotic heart disease of native coronary artery without angina pectoris: Secondary | ICD-10-CM | POA: Diagnosis not present

## 2015-12-14 DIAGNOSIS — I4891 Unspecified atrial fibrillation: Secondary | ICD-10-CM | POA: Diagnosis not present

## 2015-12-14 LAB — CBC WITH DIFFERENTIAL/PLATELET
BASOS PCT: 1 % (ref 0–1)
Basophils Absolute: 0.1 10*3/uL (ref 0.0–0.1)
EOS ABS: 0.2 10*3/uL (ref 0.0–0.7)
EOS PCT: 3 % (ref 0–5)
HCT: 43.4 % (ref 39.0–52.0)
Hemoglobin: 14.6 g/dL (ref 13.0–17.0)
Lymphocytes Relative: 46 % (ref 12–46)
Lymphs Abs: 2.4 10*3/uL (ref 0.7–4.0)
MCH: 31.1 pg (ref 26.0–34.0)
MCHC: 33.6 g/dL (ref 30.0–36.0)
MCV: 92.5 fL (ref 78.0–100.0)
MONO ABS: 0.6 10*3/uL (ref 0.1–1.0)
MONOS PCT: 11 % (ref 3–12)
MPV: 10.7 fL (ref 8.6–12.4)
NEUTROS PCT: 39 % — AB (ref 43–77)
Neutro Abs: 2 10*3/uL (ref 1.7–7.7)
PLATELETS: 158 10*3/uL (ref 150–400)
RBC: 4.69 MIL/uL (ref 4.22–5.81)
RDW: 13.6 % (ref 11.5–15.5)
WBC: 5.2 10*3/uL (ref 4.0–10.5)

## 2015-12-14 LAB — BASIC METABOLIC PANEL
BUN: 14 mg/dL (ref 7–25)
CALCIUM: 9.2 mg/dL (ref 8.6–10.3)
CO2: 29 mmol/L (ref 20–31)
CREATININE: 1.08 mg/dL (ref 0.70–1.18)
Chloride: 103 mmol/L (ref 98–110)
Glucose, Bld: 90 mg/dL (ref 65–99)
Potassium: 4 mmol/L (ref 3.5–5.3)
Sodium: 139 mmol/L (ref 135–146)

## 2015-12-17 NOTE — Addendum Note (Signed)
Addended by: Freada Bergeron on: 12/17/2015 05:52 PM   Modules accepted: Orders

## 2015-12-21 ENCOUNTER — Encounter: Payer: Self-pay | Admitting: Internal Medicine

## 2015-12-21 ENCOUNTER — Ambulatory Visit (INDEPENDENT_AMBULATORY_CARE_PROVIDER_SITE_OTHER): Payer: Commercial Managed Care - HMO | Admitting: Internal Medicine

## 2015-12-21 VITALS — BP 126/72 | HR 38 | Temp 98.1°F | Ht 69.0 in | Wt 228.1 lb

## 2015-12-21 DIAGNOSIS — K5900 Constipation, unspecified: Secondary | ICD-10-CM

## 2015-12-21 DIAGNOSIS — Z09 Encounter for follow-up examination after completed treatment for conditions other than malignant neoplasm: Secondary | ICD-10-CM

## 2015-12-21 DIAGNOSIS — I2510 Atherosclerotic heart disease of native coronary artery without angina pectoris: Secondary | ICD-10-CM

## 2015-12-21 DIAGNOSIS — K5909 Other constipation: Secondary | ICD-10-CM | POA: Insufficient documentation

## 2015-12-21 DIAGNOSIS — I10 Essential (primary) hypertension: Secondary | ICD-10-CM

## 2015-12-21 NOTE — Patient Instructions (Signed)
  GO TO THE FRONT DESK  Schedule a complete physical exam to be done in 6 months  Please be fasting    For constipation: --MiraLAX 17 g daily with fluids --Metamucil one or 2 capsules every day with your  breakfast

## 2015-12-21 NOTE — Assessment & Plan Note (Signed)
HTN: Continue lisinopril, Toprol, amlodipine. Recent BMP satisfactory CAD, CHF, atrial fibrillation: Seems in sinus rhythm today, stable. Chronic constipation: Going on for many years, up-to-date on colonoscopies, recommend daily MiraLAX and Metamucil. If not better she will let me know (On  amlodipine, that may aggravate his symptoms but he really needs excellent BP control) RTC 05-2016 for CPX

## 2015-12-21 NOTE — Progress Notes (Signed)
Subjective:    Patient ID: Philip [name], male    DOB: [birth year], 75 y.o.   MRN: [id number]  DOS:  12/21/2015 Type of visit - description : Routine office visit Interval history: His main concern today is chronic constipation, all his life, having a BM every 3 or 4 days and usually requires OTC laxatives. Note from cardiology reviewed, he is stable Labs reviewed. Medications reviewed, good compliance, ambulatory BPs within normal  Review of Systems No chest pain or difficulty breathing No nausea, vomiting or blood in the stools  Past Medical History  Diagnosis Date  . Hyperlipidemia   . CAD (coronary artery disease)     had a MI , s/p CABG  . Skin cancer     several , one of them was melanoma (sees derm routinely)  . OSA (obstructive sleep apnea)     started CPAP 12/09  . Peripheral vascular disease ([place])   . AAA (abdominal aortic aneurysm) ([place])   . Hypertension     takes Amlodipine,Metoprolol,and Lisinopril daily  . GERD (gastroesophageal reflux disease)     takes Omeprazole daily  . Myocardial infarction [facility])     several with last one being 2001(but never knew about any except 2001)  . Dysrhythmia     paroximal a fib  . Pneumonia     as a child  . Tingling     feet occasionally  . Joint pain   . Joint swelling   . History of colon polyps   . History of kidney stones   . Cataracts, bilateral     immature  . History of shingles   . Tinnitus   . Atrial fibrillation ([place])   . CHF (congestive heart failure) [facility])     Past Surgical History  Procedure Laterality Date  . Penile prosthesis implant  08/2005  . Rhinoplasty  1975  . Coronary artery bypass graft  1999    x 3 vessels  . Cardiac catheterization  2015  . Colonoscopy    . Abdominal aortic endovascular stent graft N/A 07/08/2014    Procedure: ABDOMINAL AORTIC ENDOVASCULAR STENT GRAFT/ GORE;  Surgeon: Angelia Mould, MD;  Location: [facility];  Service: Vascular;  Laterality: N/A;  . Left heart  catheterization with coronary/graft angiogram N/A 05/29/2014    Procedure: LEFT HEART CATHETERIZATION WITH [name];  Surgeon: [name], MD;  Location: [facility] CATH LAB;  Service: Cardiovascular;  Laterality: N/A;  . Abdominal aortagram N/A 06/16/2014    Procedure: ABDOMINAL AORTAGRAM;  Surgeon: Angelia Mould, MD;  Location: [facility] CATH LAB;  Service: Cardiovascular;  Laterality: N/A;    Social History   Social History  . Marital Status: Married    Spouse Name: N/A  . Number of Children: 3  . Years of Education: N/A   Occupational History  . retired, Actor    Social History Main Topics  . Smoking status: Former Research scientist (life sciences)  . Smokeless tobacco: Never Used     Comment: quit smoking in 1979  . Alcohol Use: Yes     Comment: occasionally  . Drug Use: No  . Sexual Activity: Not Currently   Other Topics Concern  . Not on file   Social History Narrative   Lives w/ wife 1 son, 2 step children        Medication List       This list is accurate as of: 12/21/15  8:33 PM.  Always use your most recent med list.  amLODipine 10 MG tablet  Commonly known as:  NORVASC  Take 1 tablet (10 mg total) by mouth daily.     atorvastatin 40 MG tablet  Commonly known as:  LIPITOR  TAKE 1 TABLET EVERY DAY  AT BEDTIME     B-COMPLEX PO  Take 1 tablet by mouth daily.     co-enzyme Q-10 30 MG capsule  Take 30 mg by mouth daily.     ELIQUIS 5 MG Tabs tablet  Generic drug:  apixaban  TAKE ONE TABLET BY MOUTH TWICE DAILY     fenofibrate 48 MG tablet  Commonly known as:  TRICOR  Take 1 tablet (48 mg total) by mouth daily.     Fish Oil 1200 MG Caps  Take 1,200 mg by mouth daily.     lisinopril 20 MG tablet  Commonly known as:  PRINIVIL,ZESTRIL  Take 0.5 tablets (10 mg total) by mouth daily.     magnesium oxide 400 MG tablet  Commonly known as:  MAG-OX  Take 800 mg by mouth daily.     metoprolol succinate 100 MG 24 hr tablet  Commonly known  as:  TOPROL-XL  TAKE 1 TABLET DAILY. TAKE WITH OR IMMEDIATELY FOLLOWING A MEAL.     multivitamin with minerals tablet  Take 1 tablet by mouth daily.     omeprazole 20 MG capsule  Commonly known as:  PRILOSEC  Take 1 capsule (20 mg total) by mouth daily.     PROBIOTIC DAILY Caps  Take 1 capsule by mouth daily.     Vitamin D3 5000 units Caps  Take 5,000 Units by mouth daily.           Objective:   Physical Exam BP 126/72 mmHg  Pulse 38  Temp(Src) 98.1 F (36.7 C) (Oral)  Ht 5\' 9"  (1.753 m)  Wt 228 lb 2 oz (103.477 kg)  BMI 33.67 kg/m2  SpO2 96% General:   Well developed, well nourished . NAD.  HEENT:  Normocephalic . Face symmetric, atraumatic Lungs:  CTA B Normal respiratory effort, no intercostal retractions, no accessory muscle use. Heart: RRR,  no murmur.  No pretibial edema bilaterally  Skin: Not pale. Not jaundice Neurologic:  alert & oriented X3.  Speech normal, gait appropriate for age and unassisted Psych--  Cognition and judgment appear intact.  Cooperative with normal attention span and concentration.  Behavior appropriate. No anxious or depressed appearing.      Assessment & Plan:   Assessment HTN Hyperlipidemia CV: ---CAD >>>  MI, CABG  ---CHF ---Peripheral vascular disease ---AAA --- A. Fib, paroxysmal  GERD  Chronic constipation OSA H/o skin cancer, Dr. [name] H/o Kidney stones 2016  Plan: HTN: Continue lisinopril, Toprol, amlodipine. Recent BMP satisfactory CAD, CHF, atrial fibrillation: Seems in sinus rhythm today, stable. Chronic constipation: Going on for many years, up-to-date on colonoscopies, recommend daily MiraLAX and Metamucil. If not better she will let me know (On  amlodipine, that may aggravate his symptoms but he really needs excellent BP control) RTC 05-2016 for CPX

## 2015-12-21 NOTE — Progress Notes (Signed)
Pre visit review using our clinic review tool, if applicable. No additional management support is needed unless otherwise documented below in the visit note. 

## 2015-12-23 ENCOUNTER — Other Ambulatory Visit: Payer: Self-pay | Admitting: Cardiology

## 2016-01-06 ENCOUNTER — Telehealth: Payer: Self-pay | Admitting: Cardiology

## 2016-01-06 ENCOUNTER — Other Ambulatory Visit: Payer: Self-pay | Admitting: *Deleted

## 2016-01-06 MED ORDER — METOPROLOL SUCCINATE ER 100 MG PO TB24: 100.0000 mg | ORAL_TABLET | Freq: Every day | ORAL | Status: DC

## 2016-01-06 NOTE — Telephone Encounter (Signed)
°*  STAT* If patient is at the pharmacy, call can be transferred to refill team.   1. Which medications need to be refilled? (please list name of each medication and dose if known) Metoprolol 100 mg-Pharmacy told him to call-need new prescription  2. Which pharmacy/location (including street and city if local pharmacy) is medication to be sent to?[facility]. Do they need a 30 day or 90 day supply?90 and refills

## 2016-01-17 ENCOUNTER — Other Ambulatory Visit: Payer: Self-pay | Admitting: Cardiology

## 2016-02-04 ENCOUNTER — Other Ambulatory Visit: Payer: Self-pay | Admitting: Cardiology

## 2016-02-17 ENCOUNTER — Encounter: Payer: Self-pay | Admitting: Vascular Surgery

## 2016-02-24 ENCOUNTER — Ambulatory Visit (INDEPENDENT_AMBULATORY_CARE_PROVIDER_SITE_OTHER): Payer: Commercial Managed Care - HMO | Admitting: Vascular Surgery

## 2016-02-24 ENCOUNTER — Encounter: Payer: Self-pay | Admitting: Vascular Surgery

## 2016-02-24 ENCOUNTER — Ambulatory Visit (HOSPITAL_COMMUNITY)
Admission: RE | Admit: 2016-02-24 | Discharge: 2016-02-24 | Disposition: A | Discharge status: Home / Self Care | Payer: Commercial Managed Care - HMO | Source: Ambulatory Visit | Attending: Vascular Surgery | Admitting: Vascular Surgery

## 2016-02-24 VITALS — BP 140/75 | HR 62 | Temp 97.0°F | Resp 16 | Ht 69.0 in | Wt 229.0 lb

## 2016-02-24 DIAGNOSIS — I714 Abdominal aortic aneurysm, without rupture, unspecified: Secondary | ICD-10-CM

## 2016-02-24 DIAGNOSIS — Z48812 Encounter for surgical aftercare following surgery on the circulatory system: Secondary | ICD-10-CM | POA: Insufficient documentation

## 2016-02-24 DIAGNOSIS — I509 Heart failure, unspecified: Secondary | ICD-10-CM | POA: Diagnosis not present

## 2016-02-24 DIAGNOSIS — K219 Gastro-esophageal reflux disease without esophagitis: Secondary | ICD-10-CM | POA: Insufficient documentation

## 2016-02-24 DIAGNOSIS — I110 Hypertensive heart disease with heart failure: Secondary | ICD-10-CM | POA: Insufficient documentation

## 2016-02-24 DIAGNOSIS — G4733 Obstructive sleep apnea (adult) (pediatric): Secondary | ICD-10-CM | POA: Insufficient documentation

## 2016-02-24 DIAGNOSIS — E785 Hyperlipidemia, unspecified: Secondary | ICD-10-CM | POA: Insufficient documentation

## 2016-02-24 NOTE — Addendum Note (Signed)
Addended by: Peter Minium K on: 02/24/2016 05:00 PM   Modules accepted: Orders

## 2016-02-24 NOTE — Progress Notes (Signed)
[facility] [place]  Patient name: Philip [name] MRN: [id number] DOB: 07/18/1941 Sex: male  REASON FOR VISIT: Follow up after endovascular aneurysm repair.  HPI: Philip [name] is a 75 y.o. male who had presented with a 7 cm infrarenal abdominal aortic aneurysm. He underwent percutaneous endovascular repair of his abdominal aortic aneurysm using 4 components in September 2015. I last saw him on 02/18/2015. This CT scan showed the maximum diameter of the aneurysm was 6.9 cm. There was ectasia of the right hypogastric artery which measured 1.3 cm in maximum diameter. The left hypogastric artery measured 2 cm in maximum diameter and was stable. There was no evidence of endoleak. I ordered a follow up ultrasound in 1 year.  Since I saw him last he denies any abdominal pain or back pain. Of note however, in January of this year, he developed sudden pain in his right groin after taking a vigorous swimming with a golf club. He developed some swelling and neck a [place] this ultimately resolved. He's had no further problems.  He is not a smoker. His blood pressure has been well controlled.  Past Medical History  Diagnosis Date  . Hyperlipidemia   . CAD (coronary artery disease)     had a MI , s/p CABG  . Skin cancer     several , one of them was melanoma (sees derm routinely)  . OSA (obstructive sleep apnea)     started CPAP 12/09  . Peripheral vascular disease ([place])   . AAA (abdominal aortic aneurysm) ([place])   . Hypertension     takes Amlodipine,Metoprolol,and Lisinopril daily  . GERD (gastroesophageal reflux disease)     takes Omeprazole daily  . Myocardial infarction [facility])     several with last one being 2001(but never knew about any except 2001)  . Dysrhythmia     paroximal a fib  . Pneumonia     as a child  . Tingling     feet occasionally  . Joint pain   . Joint swelling   . History of colon polyps   . History of kidney stones   . Cataracts, bilateral      immature  . History of shingles   . Tinnitus   . Atrial fibrillation ([place])   . CHF (congestive heart failure) ([facility])     Family History  Problem Relation Age of Onset  . Sleep apnea Brother   . Hyperlipidemia Brother   . Hypertension Brother   . Colon cancer Other     aunt  . Prostate cancer Father 34  . Heart disease Father   . Skin cancer Father   . Heart attack Father   . Cancer Father   . Deep vein thrombosis Father   . Hyperlipidemia Father   . Hypertension Father   . Heart disease Mother   . Skin cancer Mother   . Heart attack Mother   . Cancer Mother   . Hyperlipidemia Mother   . Hypertension Mother   . Varicose Veins Mother   . Peripheral vascular disease Mother     amputation  . Diabetes Neg Hx   . Stroke Neg Hx   . Skin cancer Brother   . Skin cancer Sister   . Cancer Sister   . Heart disease Sister     SOCIAL HISTORY: Social History  Substance Use Topics  . Smoking status: Former Research scientist (life sciences)  . Smokeless tobacco: Never Used     Comment: quit smoking  in 1979  . Alcohol Use: Yes     Comment: occasionally    No Known Allergies  Current Outpatient Prescriptions  Medication Sig Dispense Refill  . amLODipine (NORVASC) 10 MG tablet TAKE 1 TABLET EVERY DAY 90 tablet 1  . atorvastatin (LIPITOR) 40 MG tablet TAKE 1 TABLET EVERY DAY AT BEDTIME 90 tablet 1  . B Complex-Biotin-FA (B-COMPLEX PO) Take 1 tablet by mouth daily.    . Cholecalciferol (VITAMIN D3) 5000 UNITS CAPS Take 5,000 Units by mouth daily.    Marland Kitchen co-enzyme Q-10 30 MG capsule Take 30 mg by mouth daily.    Marland Kitchen ELIQUIS 5 MG TABS tablet TAKE ONE TABLET BY MOUTH TWICE DAILY 60 tablet 11  . fenofibrate (TRICOR) 48 MG tablet TAKE 1 TABLET (48 MG TOTAL) BY MOUTH DAILY. 90 tablet 1  . lisinopril (PRINIVIL,ZESTRIL) 20 MG tablet Take 0.5 tablets (10 mg total) by mouth daily. 90 tablet 0  . magnesium oxide (MAG-OX) 400 MG tablet Take 800 mg by mouth daily.    . metoprolol succinate (TOPROL-XL) 100 MG 24 hr  tablet Take 1 tablet (100 mg total) by mouth daily. TAKE WITH OR IMMEDIATELY FOLLOWING A MEAL. 90 tablet 3  . Multiple Vitamins-Minerals (MULTIVITAMIN WITH MINERALS) tablet Take 1 tablet by mouth daily.    . Omega-3 Fatty Acids (FISH OIL) 1200 MG CAPS Take 1,200 mg by mouth daily.     No current facility-administered medications for this visit.    REVIEW OF SYSTEMS:  [X]  denotes positive finding, [ ]  denotes negative finding Cardiac  Comments:  Chest pain or chest pressure:    Shortness of breath upon exertion: X   Short of breath when lying flat:    Irregular heart rhythm:        Vascular    Pain in calf, thigh, or hip brought on by ambulation: X   Pain in feet at night that wakes you up from your sleep:     Blood clot in your veins:    Leg swelling:  X       Pulmonary    Oxygen at home:    Productive cough:     Wheezing:         Neurologic    Sudden weakness in arms or legs:     Sudden numbness in arms or legs:     Sudden onset of difficulty speaking or slurred speech:    Temporary loss of vision in one eye:     Problems with dizziness:  X       Gastrointestinal    Blood in stool:     Vomited blood:         Genitourinary    Burning when urinating:     Blood in urine:        Psychiatric    Major depression:         Hematologic    Bleeding problems:    Problems with blood clotting too easily:        Skin    Rashes or ulcers:        Constitutional    Fever or chills:      PHYSICAL EXAM: Filed Vitals:   02/24/16 0908  BP: 140/75  Pulse: 62  Temp: 97 F (36.1 C)  TempSrc: Oral  Resp: 16  Height: 5\' 9"  (1.753 m)  Weight: 229 lb (103.874 kg)  SpO2: 98%    GENERAL: The patient is a well-nourished male, in no acute distress. The vital signs are  documented above. CARDIAC: There is a regular rate and rhythm.  VASCULAR: I do not detect carotid bruits. He has palpable femoral and posterior tibial pulses bilaterally. He has mild bilateral lower extremity  swelling. PULMONARY: There is good air exchange bilaterally without wheezing or rales. ABDOMEN: Soft and non-tender with normal pitched bowel sounds.  MUSCULOSKELETAL: There are no major deformities or cyanosis. NEUROLOGIC: No focal weakness or paresthesias are detected. SKIN: There are no ulcers or rashes noted. PSYCHIATRIC: The patient has a normal affect.  DATA:   DUPLEX ABDOMINAL AORTA: I have independently interpreted his duplex of his abdominal aorta which shows the maximum diameter of his aneurysm is 6.7 cm. Thus this is stable in size compared to 6.9 cm by CT scan 1 year ago.  MEDICAL ISSUES:  STATUS POST ENDOVASCULAR ANEURYSM REPAIR: His aneurysm is decreased slightly in size. Is now 6.7 cm. I have recommended a follow up study in 1 year. Of note, he has small hypogastric artery aneurysms and therefore we will get a CT angiogram next year and likely alternate with ultrasound if there are no significant changes. Fortunately he is not a smoker. I'll see him back in 1 year Lessie call sooner.   Deitra Mayo [facility] [place]: [phone]

## 2016-03-18 IMAGING — CT CT CTA ABD/PEL W/CM AND/OR W/O CM
1 of 7 series · 12 of 36 positions shown, 17 images · IV contrast (60CC OMNI 350)
Comparison: Ultrasound of the aorta on 04/24/2014

CLINICAL DATA: Abdominal aortic aneurysm.

EXAM:
CTA ABDOMEN AND PELVIS WITHOUT CONTRAST
TECHNIQUE: Multidetector CT imaging of the abdomen and pelvis was performed
using the standard protocol during bolus administration of
intravenous contrast. Multiplanar reconstructed images and MIPs were
obtained and reviewed to evaluate the vascular anatomy.
CONTRAST:  60mL OMNIPAQUE IOHEXOL 350 MG/ML SOLN

[Series 4: angio (id) · axial · 0.86mm/px · z∈[-434,+8]mm · 12 of 207 slices shown, 17 images]
[im 15/207  soft-tissue]
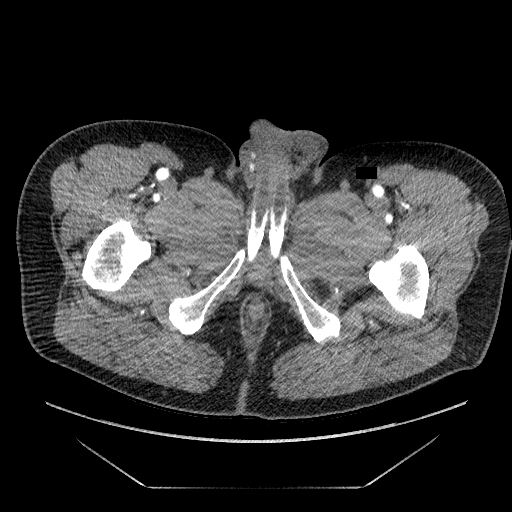
[im 15/207  bone]
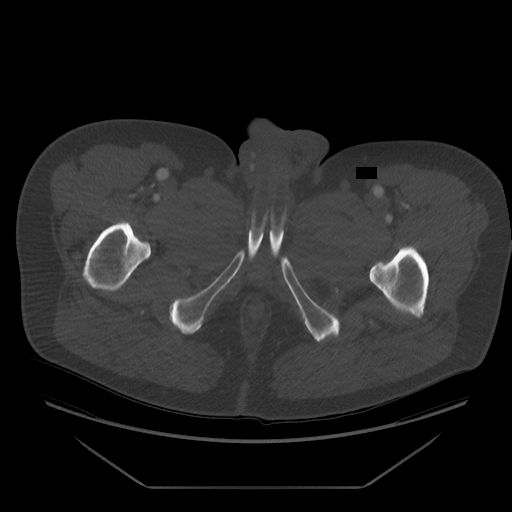
[im 30/207  soft-tissue]
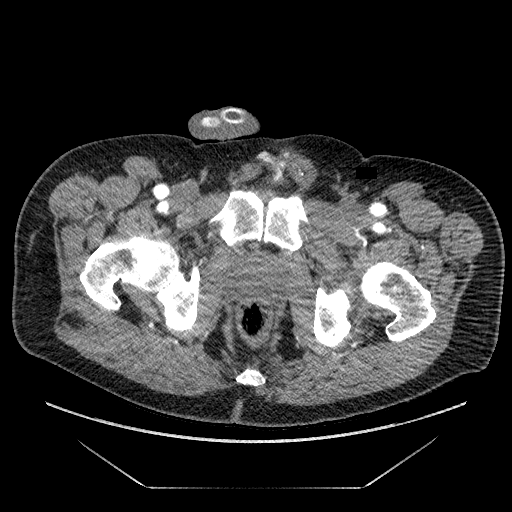
[im 45/207  soft-tissue]
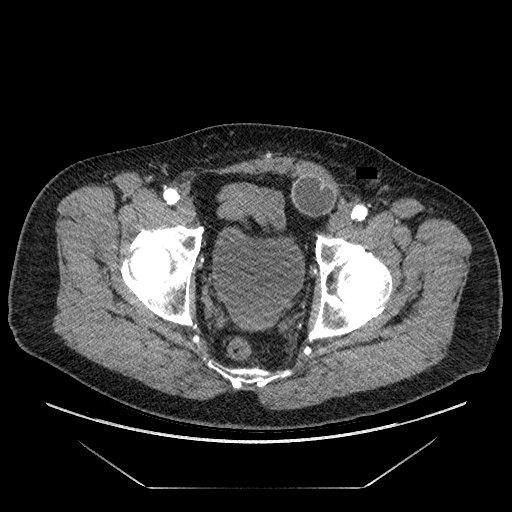
[im 74/207  soft-tissue]
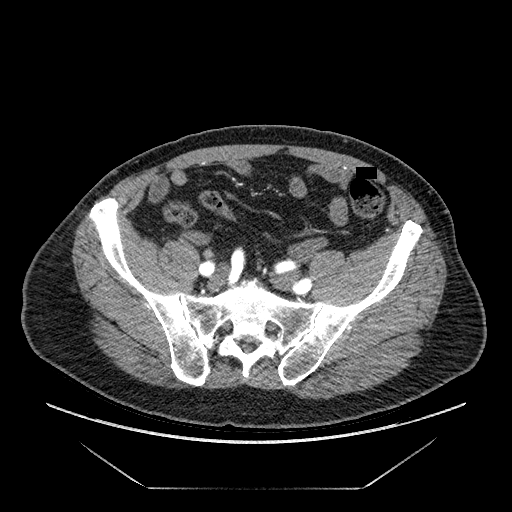
[im 89/207  soft-tissue]
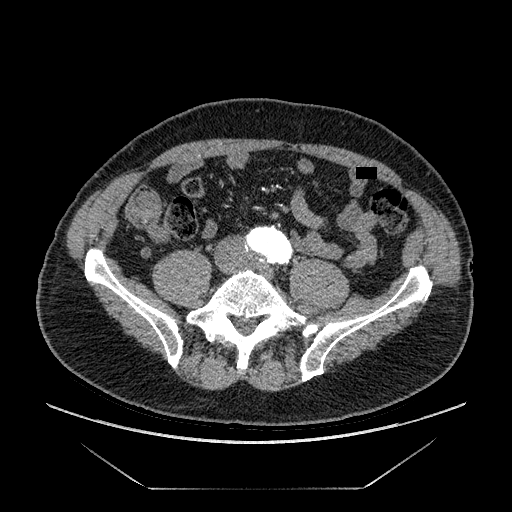
[im 104/207  soft-tissue]
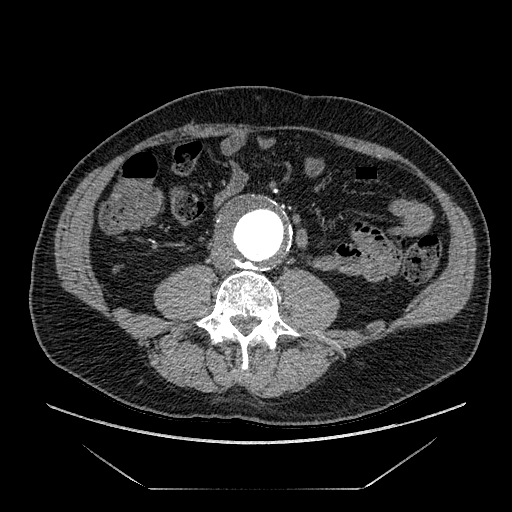
[im 118/207  soft-tissue]
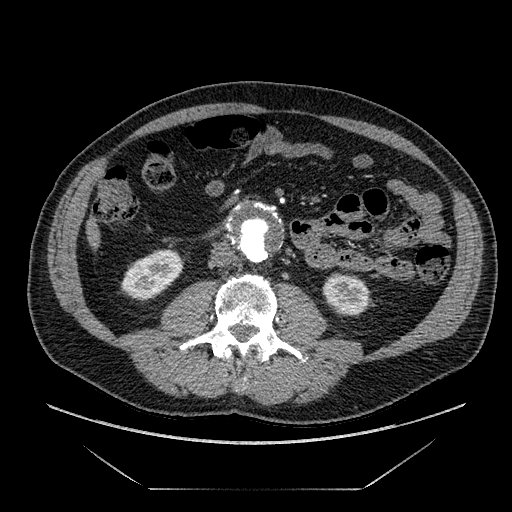
[im 133/207  soft-tissue]
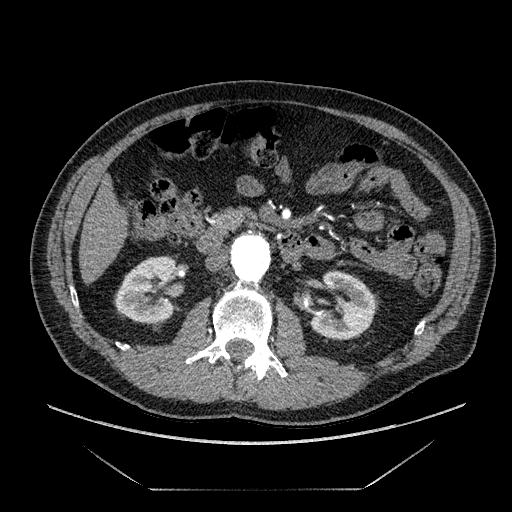
[im 148/207  lung]
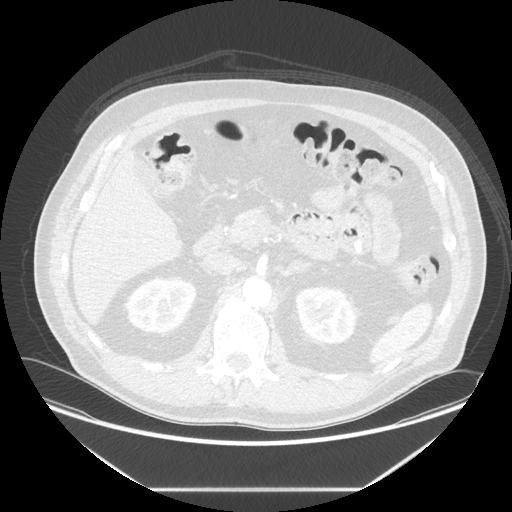
[im 162/207  soft-tissue]
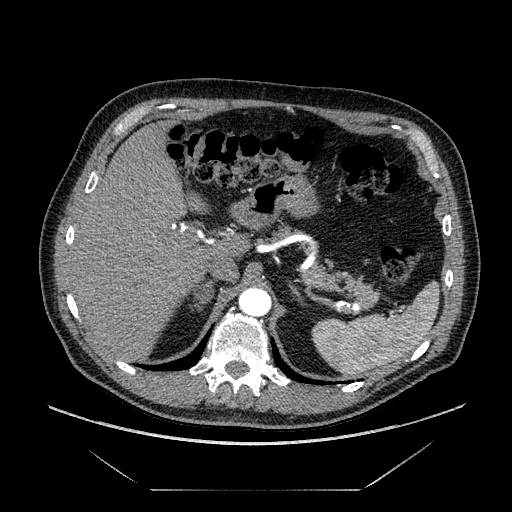
[im 162/207  lung]
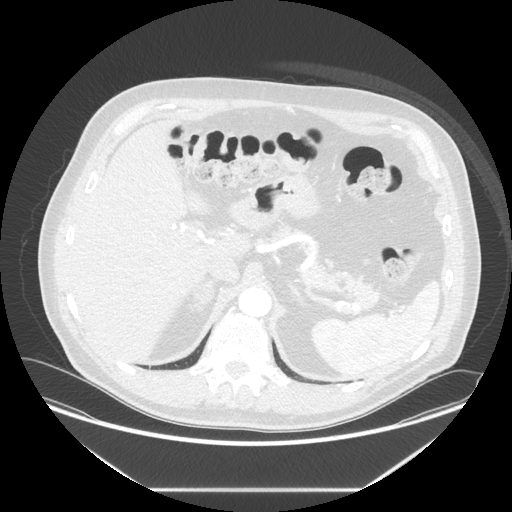
[im 162/207  bone]
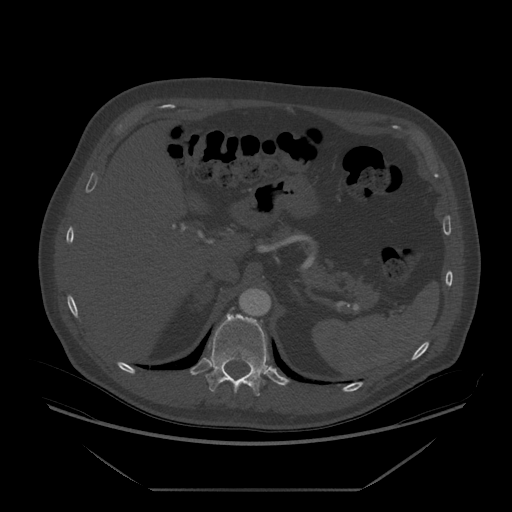
[im 177/207  soft-tissue]
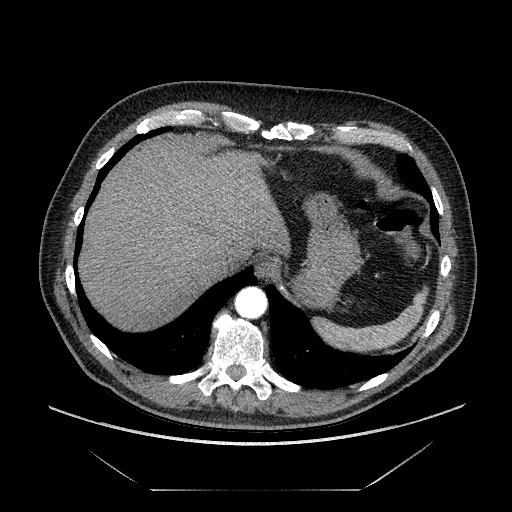
[im 177/207  lung]
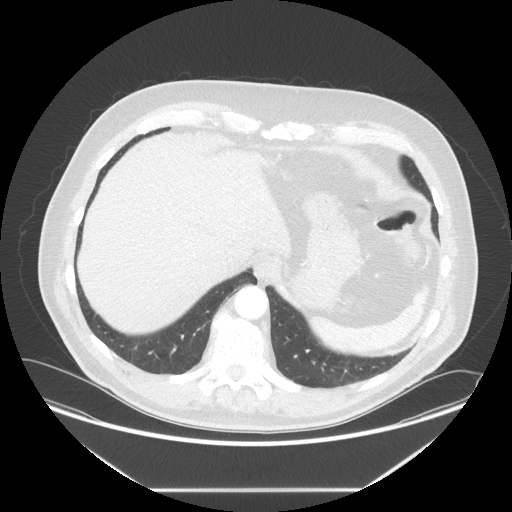
[im 192/207  soft-tissue]
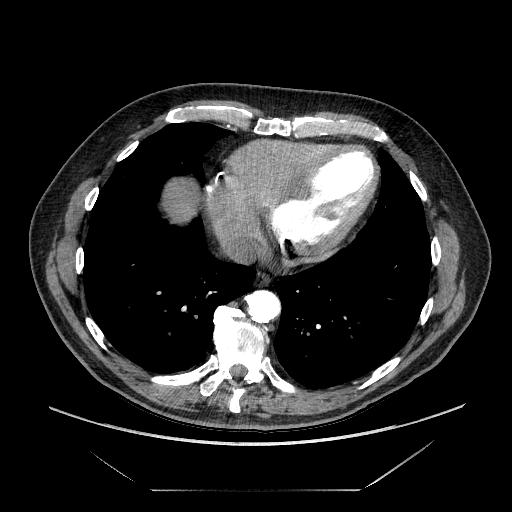
[im 192/207  lung]
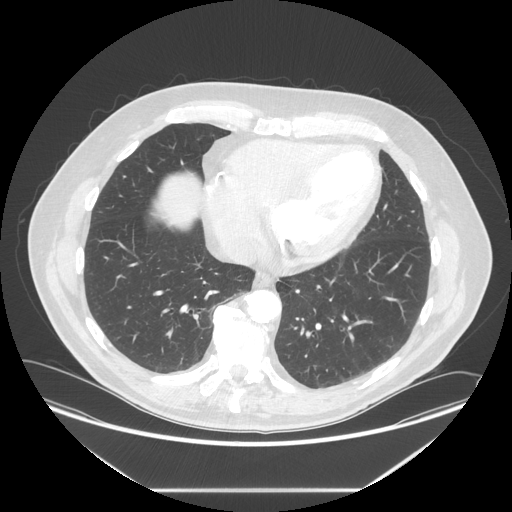

[12 of 36 positions shown; findings below may reference images not displayed]

FINDINGS: Complex aneurysmal disease of the abdominal aorta is present.
Immediately inferior to a single left renal artery, the aorta shows
tapered dilatation up to a maximum AP diameter of approximately
cm. Infrarenal neck is very short and measures approximately 2.8 cm
in diameter.

There is a short segment of slight narrowing in aortic diameter to
3.6 cm before more significant fusiform dilatation of the distal
aorta up to a maximal AP diameter of 6.5 cm. At the level of
greatest aneurysmal dilatation, maximal transverse width is 6.7 cm
and maximal oblique diameter is 6.9 cm. No evidence of aneurysm
rupture. No dissection identified.

The aneurysm extends to the aortic bifurcation and does not involve
the common iliac arteries. Both common iliac arteries show calcified
atherosclerotic plaque without significant stenosis. The common
iliac arteries are very short in length with high bifurcations
present in the pelvis. External iliac arteries are tortuous
bilaterally abut show normal patency. Aneurysm of the left
hypogastric artery trunk measures approximately 2.1 cm in greatest
diameter and is located approximately 3-4 cm beyond the origin of
the vessel and at the bifurcation between anterior and posterior
divisions. The anterior division appears to be chronically occluded.
The common femoral arteries show atherosclerosis bilaterally without
significant stenosis or aneurysm.

The superior and inferior mesenteric arteries are normally patent.
There is mild plaque at the origin of the celiac axis without
significant stenosis. A single right renal artery shows normal
patency.

Review of the MIP images confirms the above findings.

Nonvascular evaluation demonstrates nodular enlargement of the
superior right adrenal gland up to a diameter of approximately
cm. This region measures approximately 12 Hounsfield units in
density and likely is consistent with an adenoma. Similar smaller
1.6 cm lateral left adrenal nodule is also present with internal
density of approximately 18 Hounsfield units.

A penile device reservoir is located in the left anterior pelvis.
Bony structures show mild degenerative changes of the lumbar spine.
IMPRESSION: 1. Complex abdominal aortic aneurysm, as above. The aorta measures
6.9 cm in greatest oblique diameter. Aneurysmal dilatation begins
immediately inferior to the left renal artery and initially to
measures 4.2 cm in greatest diameter prior to a short segment of
slight narrowing before more significant aneurysmal dilatation of
the distal aorta.
2. 2.1 cm focal aneurysm of the left hypogastric artery trunk.
3. Bilateral adrenal masses which are fairly low density by
Hounsfield unit measurement and likely correspond to bilateral
adrenal adenomas.

## 2016-04-11 ENCOUNTER — Other Ambulatory Visit: Payer: Self-pay | Admitting: *Deleted

## 2016-04-11 MED ORDER — LISINOPRIL 20 MG PO TABS: 10.0000 mg | ORAL_TABLET | Freq: Every day | ORAL | Status: DC

## 2016-04-12 ENCOUNTER — Encounter: Payer: Self-pay | Admitting: Cardiology

## 2016-04-26 ENCOUNTER — Telehealth: Payer: Self-pay | Admitting: Internal Medicine

## 2016-04-26 DIAGNOSIS — Z85828 Personal history of other malignant neoplasm of skin: Secondary | ICD-10-CM

## 2016-04-26 NOTE — Telephone Encounter (Signed)
Referral placed.

## 2016-04-26 NOTE — Telephone Encounter (Signed)
°  Relationship to patient: Self   Can be reached: [phone]    Reason for call: Request updated referral to Dr. Corliss Blacker (Dermatologist)

## 2016-05-04 DIAGNOSIS — L820 Inflamed seborrheic keratosis: Secondary | ICD-10-CM | POA: Diagnosis not present

## 2016-05-04 DIAGNOSIS — L570 Actinic keratosis: Secondary | ICD-10-CM | POA: Diagnosis not present

## 2016-05-16 ENCOUNTER — Ambulatory Visit (INDEPENDENT_AMBULATORY_CARE_PROVIDER_SITE_OTHER): Payer: Commercial Managed Care - HMO | Admitting: Acute Care

## 2016-05-16 ENCOUNTER — Encounter: Payer: Self-pay | Admitting: Acute Care

## 2016-05-16 VITALS — BP 122/68 | HR 69 | Ht 69.0 in | Wt 233.0 lb

## 2016-05-16 DIAGNOSIS — G4733 Obstructive sleep apnea (adult) (pediatric): Secondary | ICD-10-CM

## 2016-05-16 NOTE — Progress Notes (Signed)
History of Present Illness Philip [name] is a 75 y.o. male with OSA on CPAP followed by Dr. [name].   7/17/2017Office Visit for Compliance and supplies: Philip [name] presents to the office today for follow up and need to order new CPAP supplies.Marland Kitchen He was last in the office 12/24/2014. He states that he is wearing his CPAP machine every night. He is compliant with usage for an average of 5 hours night. He states  he usually only sleeps approximately  5-6 hours. He is not waking while asleep, and is not snoring.He feels well rested after having his 4-5 hours . He is active playing golf several times a week. He denies chest pain, fever,orthopnea or hemoptysis. He understands he needs to work on eight loss to optimize treatment.     Tests: Down Load: 05/16/2016  Therapy Mode: APAP EPR Level: 3.0 cm H2O  Average Usage : 96% Compliant for greater than 4 hours each night. AHI= 3.6 per hour.  Past medical hx Past Medical History  Diagnosis Date  . Hyperlipidemia   . CAD (coronary artery disease)     had a MI , s/p CABG  . Skin cancer     several , one of them was melanoma (sees derm routinely)  . OSA (obstructive sleep apnea)     started CPAP 12/09  . Peripheral vascular disease ([place])   . AAA (abdominal aortic aneurysm) ([place])   . Hypertension     takes Amlodipine,Metoprolol,and Lisinopril daily  . GERD (gastroesophageal reflux disease)     takes Omeprazole daily  . Myocardial infarction [facility])     several with last one being 2001(but never knew about any except 2001)  . Dysrhythmia     paroximal a fib  . Pneumonia     as a child  . Tingling     feet occasionally  . Joint pain   . Joint swelling   . History of colon polyps   . History of kidney stones   . Cataracts, bilateral     immature  . History of shingles   . Tinnitus   . Atrial fibrillation ([place])   . CHF (congestive heart failure) ([facility])      Past surgical hx, Family hx, Social hx all reviewed.  Current Outpatient  Prescriptions on File Prior to Visit  Medication Sig  . amLODipine (NORVASC) 10 MG tablet TAKE 1 TABLET EVERY DAY  . atorvastatin (LIPITOR) 40 MG tablet TAKE 1 TABLET EVERY DAY AT BEDTIME  . B Complex-Biotin-FA (B-COMPLEX PO) Take 1 tablet by mouth daily.  . Cholecalciferol (VITAMIN D3) 5000 UNITS CAPS Take 5,000 Units by mouth daily.  Marland Kitchen co-enzyme Q-10 30 MG capsule Take 30 mg by mouth daily.  Marland Kitchen ELIQUIS 5 MG TABS tablet TAKE ONE TABLET BY MOUTH TWICE DAILY  . fenofibrate (TRICOR) 48 MG tablet TAKE 1 TABLET (48 MG TOTAL) BY MOUTH DAILY.  Marland Kitchen lisinopril (PRINIVIL,ZESTRIL) 20 MG tablet Take 0.5 tablets (10 mg total) by mouth daily.  . magnesium oxide (MAG-OX) 400 MG tablet Take 800 mg by mouth daily.  . metoprolol succinate (TOPROL-XL) 100 MG 24 hr tablet Take 1 tablet (100 mg total) by mouth daily. TAKE WITH OR IMMEDIATELY FOLLOWING A MEAL.  . Multiple Vitamins-Minerals (MULTIVITAMIN WITH MINERALS) tablet Take 1 tablet by mouth daily.  . Omega-3 Fatty Acids (FISH OIL) 1200 MG CAPS Take 1,200 mg by mouth daily.   No current facility-administered medications on file prior to visit.     No Known Allergies  Review Of Systems:  Constitutional:   No  weight loss, night sweats,  Fevers, chills, fatigue, or  lassitude.  HEENT:   No headaches,  Difficulty swallowing,  Tooth/dental problems, or  Sore throat,                No sneezing, itching, ear ache, nasal congestion, post nasal drip,   CV:  No chest pain,  Orthopnea, PND, swelling in lower extremities, anasarca, dizziness, palpitations, syncope.   GI  No heartburn, indigestion, abdominal pain, nausea, vomiting, diarrhea, change in bowel habits, loss of appetite, bloody stools.   Resp: No shortness of breath with exertion or at rest.  No excess mucus, no productive cough,  No non-productive cough,  No coughing up of blood.  No change in color of mucus.  No wheezing.  No chest wall deformity  Skin: no rash or lesions.  GU: no dysuria, change  in color of urine, no urgency or frequency.  No flank pain, no hematuria   MS:  No joint pain or swelling.  No decreased range of motion.  No back pain.  Psych:  No change in mood or affect. No depression or anxiety.  No memory loss.   Vital Signs BP 122/68 mmHg  Pulse 69  Ht 5\' 9"  (1.753 m)  Wt 233 lb (105.688 kg)  BMI 34.39 kg/m2  SpO2 98%   Physical Exam:  General- No distress,  A&Ox3, pleasant, overweight. ENT: No sinus tenderness, TM clear, pale nasal mucosa, no oral exudate,no post nasal drip, no LAN Cardiac: S1, S2, regular rate and rhythm, no murmur Chest: No wheeze/ rales/ dullness; no accessory muscle use, no nasal flaring, no sternal retractions Abd.: Soft Non-tender Ext: No clubbing cyanosis, edema Neuro:  normal strength Skin: No rashes, warm and dry Psych: normal mood and behavior   Assessment/Plan  OSA (obstructive sleep apnea) Current Down Load indicates patient is benefiting from CPAP Treatment CPAP supplies to be renewed   Plan: We will place an order for your new machine and supplies Continue on CPAP at bedtime. You appear to be benefiting from the treatment Goal is to wear for at least 4-6 hours each night for maximal clinical benefit. Continue to work on weight loss, as the link between excess weight  and sleep apnea is well established.  Do not drive if sleepy. Avoid medications that have sedating effects. Follow up with Dr. [name] In 1 year or before as needed. Please let us know if you need anything sooner. Please contact office for sooner follow up if symptoms do not improve or worsen or seek emergency care       Magdalen Spatz, NP 05/16/2016  6:46 PM

## 2016-05-16 NOTE — Patient Instructions (Addendum)
It is great to meet you today. We will place an order for your new machine and supplies Continue on CPAP at bedtime. You appear to be benefiting from the treatment Goal is to wear for at least 4-6 hours each night for maximal clinical benefit. Continue to work on weight loss, as the link between excess weight  and sleep apnea is well established.  Do not drive if sleepy. Follow up with Dr. [name] In 1 year or before as needed. Please let us know if you need anything sooner. Please contact office for sooner follow up if symptoms do not improve or worsen or seek emergency care

## 2016-05-16 NOTE — Assessment & Plan Note (Addendum)
Current Down Load indicates patient is benefiting from CPAP Treatment CPAP supplies to be renewed   Plan: We will place an order for your new machine and supplies Continue on CPAP at bedtime. You appear to be benefiting from the treatment Goal is to wear for at least 4-6 hours each night for maximal clinical benefit. Continue to work on weight loss, as the link between excess weight  and sleep apnea is well established.  Do not drive if sleepy. Avoid medications that have sedating effects. Follow up with Dr. [name] In 1 year or before as needed. Please let us know if you need anything sooner. Please contact office for sooner follow up if symptoms do not improve or worsen or seek emergency care

## 2016-05-23 ENCOUNTER — Telehealth: Payer: Self-pay | Admitting: Internal Medicine

## 2016-05-23 DIAGNOSIS — Z0100 Encounter for examination of eyes and vision without abnormal findings: Secondary | ICD-10-CM

## 2016-05-23 DIAGNOSIS — H40003 Preglaucoma, unspecified, bilateral: Secondary | ICD-10-CM | POA: Diagnosis not present

## 2016-05-23 DIAGNOSIS — H40011 Open angle with borderline findings, low risk, right eye: Secondary | ICD-10-CM | POA: Diagnosis not present

## 2016-05-23 DIAGNOSIS — H40012 Open angle with borderline findings, low risk, left eye: Secondary | ICD-10-CM | POA: Diagnosis not present

## 2016-05-23 NOTE — Telephone Encounter (Signed)
Pt called back in to inform PCP that his appt is today at 2:00 and would like to know if could be completed today in PCP's absence.    [place] GB

## 2016-05-23 NOTE — Telephone Encounter (Signed)
Pt called in to request a referral to an Eye Dr. For an eye exam. Pt says that he would like to go to [place] on [address].

## 2016-05-23 NOTE — Telephone Encounter (Signed)
Referral placed.

## 2016-05-27 ENCOUNTER — Telehealth: Payer: Self-pay | Admitting: Acute Care

## 2016-05-27 DIAGNOSIS — G4733 Obstructive sleep apnea (adult) (pediatric): Secondary | ICD-10-CM

## 2016-05-27 NOTE — Telephone Encounter (Signed)
Attempted to contact pt. No answer, no option to leave a message. Will try back.  

## 2016-05-30 NOTE — Telephone Encounter (Signed)
Spoke with pt. Advised him that [facility] confirmed receiving his order on 05/16/16. I have left a message with [name] to check the status of this.

## 2016-05-30 NOTE — Telephone Encounter (Signed)
Spoke with Philip [name]. States that we need to add the CPAP settings to the order. This order has been updated. Pt is aware that this has been handled. Nothing further was needed.

## 2016-05-30 NOTE — Telephone Encounter (Signed)
[name] returning call and can be reached @ [phone].Stanley A Dalton ° ° °

## 2016-06-03 DIAGNOSIS — G4733 Obstructive sleep apnea (adult) (pediatric): Secondary | ICD-10-CM | POA: Diagnosis not present

## 2016-07-04 DIAGNOSIS — G4733 Obstructive sleep apnea (adult) (pediatric): Secondary | ICD-10-CM | POA: Diagnosis not present

## 2016-07-15 ENCOUNTER — Other Ambulatory Visit: Payer: Self-pay

## 2016-07-15 ENCOUNTER — Other Ambulatory Visit: Payer: Self-pay | Admitting: Cardiology

## 2016-07-15 MED ORDER — FENOFIBRATE 48 MG PO TABS: ORAL_TABLET | ORAL | 1 refills | Status: DC

## 2016-08-03 DIAGNOSIS — G4733 Obstructive sleep apnea (adult) (pediatric): Secondary | ICD-10-CM | POA: Diagnosis not present

## 2016-09-05 ENCOUNTER — Encounter: Payer: Self-pay | Admitting: Pulmonary Disease

## 2016-09-05 ENCOUNTER — Ambulatory Visit (INDEPENDENT_AMBULATORY_CARE_PROVIDER_SITE_OTHER): Payer: Commercial Managed Care - HMO | Admitting: Pulmonary Disease

## 2016-09-05 DIAGNOSIS — I482 Chronic atrial fibrillation, unspecified: Secondary | ICD-10-CM

## 2016-09-05 DIAGNOSIS — G4733 Obstructive sleep apnea (adult) (pediatric): Secondary | ICD-10-CM | POA: Diagnosis not present

## 2016-09-05 NOTE — Assessment & Plan Note (Signed)
Controlled.  

## 2016-09-05 NOTE — Patient Instructions (Addendum)
CPAP is set at 10 cm and is working well Trial of nasal pillows when you're due for your next mask change

## 2016-09-05 NOTE — Assessment & Plan Note (Signed)
CPAP is set at 10 cm and is working well Trial of nasal pillows when you're due for your next mask change  Weight loss encouraged, compliance with goal of at least 4-6 hrs every night is the expectation. Advised against medications with sedative side effects Cautioned against driving when sleepy - understanding that sleepiness will vary on a day to day basis

## 2016-09-05 NOTE — Progress Notes (Signed)
   Subjective:    Patient ID: Philip [name], male    DOB: 13-Jun-1941, 75 y.o.   MRN: [id number]  HPI  75 year old for FU of OSA. PSG 07/2008 showed severe OSA with AHI 33 per hour lowest desaturation of 84% and PLM's. This was corrected by CPAP of 10 cm  09/05/2016  Chief Complaint  Patient presents with  . Follow-up    Pt states he likes new CPAP better than other one. Pt states pressure is good. Pt wears CPAP all through night.   He got a new CPAP machine about 3 months ago This was set to 10 cm, he also obtained new nasal mask and he seems like this very well. His earlier fullface mask used to be a lot due to the beard. He is to problems with mask or pressure Feels well rested in the mornings  CPAP download shows good control of events and 10 cm with excellent compliance and with mild leak  Blood pressure is well controlled as his atrial fibrillation  Review of Systems neg for any significant sore throat, dysphagia, itching, sneezing, nasal congestion or excess/ purulent secretions, fever, chills, sweats, unintended wt loss, pleuritic or exertional cp, hempoptysis, orthopnea pnd or change in chronic leg swelling. Also denies presyncope, palpitations, heartburn, abdominal pain, nausea, vomiting, diarrhea or change in bowel or urinary habits, dysuria,hematuria, rash, arthralgias, visual complaints, headache, numbness weakness or ataxia.     Objective:   Physical Exam  Gen. Pleasant, obese, in no distress ENT - no lesions, no post nasal drip Neck: No JVD, no thyromegaly, no carotid bruits Lungs: no use of accessory muscles, no dullness to percussion, decreased without rales or rhonchi  Cardiovascular: Rhythm regular, heart sounds  normal, no murmurs or gallops, no peripheral edema Musculoskeletal: No deformities, no cyanosis or clubbing , no tremors       Assessment & Plan:

## 2016-09-09 ENCOUNTER — Other Ambulatory Visit: Payer: Self-pay | Admitting: Cardiology

## 2016-09-19 ENCOUNTER — Encounter: Payer: Self-pay | Admitting: Pulmonary Disease

## 2016-09-26 ENCOUNTER — Ambulatory Visit (INDEPENDENT_AMBULATORY_CARE_PROVIDER_SITE_OTHER): Payer: Commercial Managed Care - HMO

## 2016-09-26 DIAGNOSIS — Z23 Encounter for immunization: Secondary | ICD-10-CM | POA: Diagnosis not present

## 2016-10-03 DIAGNOSIS — G4733 Obstructive sleep apnea (adult) (pediatric): Secondary | ICD-10-CM | POA: Diagnosis not present

## 2016-10-26 ENCOUNTER — Telehealth: Payer: Self-pay | Admitting: Internal Medicine

## 2016-10-26 DIAGNOSIS — Z85828 Personal history of other malignant neoplasm of skin: Secondary | ICD-10-CM

## 2016-10-26 NOTE — Telephone Encounter (Signed)
Referral placed. Pt is overdue for CPE. Please schedule at Pt's convenience. Thank you.

## 2016-10-26 NOTE — Telephone Encounter (Signed)
Relation to [id number] Call back number:[phone]   Reason for call:  Patient requesting a referral to Dr Druscilla Brownie dermatology patient has a scheduled semi annual check up for he's skin cancer appointment scheduled for 11/02/16, please advise

## 2016-10-26 NOTE — Telephone Encounter (Signed)
Patient scheduled for 12/06/2016 at 3:30 for physical.

## 2016-10-29 DIAGNOSIS — R509 Fever, unspecified: Secondary | ICD-10-CM | POA: Diagnosis not present

## 2016-10-29 DIAGNOSIS — J069 Acute upper respiratory infection, unspecified: Secondary | ICD-10-CM | POA: Diagnosis not present

## 2016-11-03 DIAGNOSIS — G4733 Obstructive sleep apnea (adult) (pediatric): Secondary | ICD-10-CM | POA: Diagnosis not present

## 2016-11-07 ENCOUNTER — Encounter: Payer: Self-pay | Admitting: Internal Medicine

## 2016-11-07 ENCOUNTER — Ambulatory Visit (INDEPENDENT_AMBULATORY_CARE_PROVIDER_SITE_OTHER): Payer: Medicare HMO | Admitting: Internal Medicine

## 2016-11-07 VITALS — BP 124/76 | HR 66 | Temp 98.1°F | Resp 14 | Ht 69.0 in | Wt 238.0 lb

## 2016-11-07 DIAGNOSIS — R05 Cough: Secondary | ICD-10-CM

## 2016-11-07 DIAGNOSIS — R059 Cough, unspecified: Secondary | ICD-10-CM

## 2016-11-07 MED ORDER — BENZONATATE 200 MG PO CAPS: 200.0000 mg | ORAL_CAPSULE | Freq: Three times a day (TID) | ORAL | 0 refills | Status: DC | PRN

## 2016-11-07 MED ORDER — AZITHROMYCIN 250 MG PO TABS: ORAL_TABLET | ORAL | 0 refills | Status: DC

## 2016-11-07 NOTE — Patient Instructions (Signed)
Rest, fluids , tylenol  For cough:  Take Mucinex DM twice a day as needed until better Also take tessalon if the cough presist Ok to take codeine at night only    If  nasal congestion: Use OTC Nasocort or Flonase : 2 nasal sprays on each side of the nose in the morning until you feel better   Avoid decongestants such as  Pseudoephedrine or phenylephrine     Take the antibiotic as prescribed   (zithromax)  Call if not gradually better over the next  10 days  Call anytime if the symptoms are severe

## 2016-11-07 NOTE — Progress Notes (Signed)
Pre visit review using our clinic review tool, if applicable. No additional management support is needed unless otherwise documented below in the visit note. 

## 2016-11-07 NOTE — Progress Notes (Signed)
Subjective:    Patient ID: Philip [name], male    DOB: 1941-04-25, 76 y.o.   MRN: [id number]  DOS:  11/07/2016 Type of visit - description : acute Interval history: Symptoms started 10/29/2016, mild chest tightness and cough. Went to urgent care, note reviewed, prescribe amoxicillin, prednisone, Tessalon Perles. Overall feels slightly better but he has a persisting cough with occasional greenish sputum and malaise. No hemoptysis. Note from the urgent care reviewed and  is confirmatory.   Review of Systems No fever chills No nausea, vomiting, diarrhea No headaches Minimal sinus congestion At some points he had some wheezing but not anymore  Past Medical History:  Diagnosis Date  . AAA (abdominal aortic aneurysm) ([place])   . Atrial fibrillation ([place])   . CAD (coronary artery disease)    had a MI , s/p CABG  . Cataracts, bilateral    immature  . CHF (congestive heart failure) ([place])   . Dysrhythmia    paroximal a fib  . GERD (gastroesophageal reflux disease)    takes Omeprazole daily  . History of colon polyps   . History of kidney stones   . History of shingles   . Hyperlipidemia   . Hypertension    takes Amlodipine,Metoprolol,and Lisinopril daily  . Joint pain   . Joint swelling   . Myocardial infarction    several with last one being 2001(but never knew about any except 2001)  . OSA (obstructive sleep apnea)    started CPAP 12/09  . Peripheral vascular disease ([place])   . Pneumonia    as a child  . Skin cancer    several , one of them was melanoma (sees derm routinely)  . Tingling    feet occasionally  . Tinnitus     Past Surgical History:  Procedure Laterality Date  . ABDOMINAL AORTAGRAM N/A 06/16/2014   Procedure: ABDOMINAL Maxcine Ham;  Surgeon: Angelia Mould, MD;  Location: [facility] CATH LAB;  Service: Cardiovascular;  Laterality: N/A;  . ABDOMINAL AORTIC ENDOVASCULAR STENT GRAFT N/A 07/08/2014   Procedure: ABDOMINAL AORTIC ENDOVASCULAR STENT GRAFT/ GORE;   Surgeon: Angelia Mould, MD;  Location: [place];  Service: Vascular;  Laterality: N/A;  . CARDIAC CATHETERIZATION  2015  . COLONOSCOPY    . CORONARY ARTERY BYPASS GRAFT  1999   x 3 vessels  . LEFT HEART CATHETERIZATION WITH CORONARY/GRAFT ANGIOGRAM N/A 05/29/2014   Procedure: LEFT HEART CATHETERIZATION WITH [name];  Surgeon: [name], MD;  Location: [facility] CATH LAB;  Service: Cardiovascular;  Laterality: N/A;  . PENILE PROSTHESIS IMPLANT  08/2005  . RHINOPLASTY  1975    Social History   Social History  . Marital status: Married    Spouse name: N/A  . Number of children: 3  . Years of education: N/A   Occupational History  . retired, Actor    Social History Main Topics  . Smoking status: Former Research scientist (life sciences)  . Smokeless tobacco: Never Used     Comment: quit smoking in 1979  . Alcohol use Yes     Comment: occasionally  . Drug use: No  . Sexual activity: Not Currently   Other Topics Concern  . Not on file   Social History Narrative   Lives w/ wife 1 son, 2 step children      Allergies as of 11/07/2016   No Known Allergies     Medication List       Accurate as of 11/07/16 11:59 PM. Always use your most recent  med list.          amLODipine 10 MG tablet Commonly known as:  NORVASC Take 1 tablet (10 mg total) by mouth daily.   atorvastatin 40 MG tablet Commonly known as:  LIPITOR Take 1 tablet (40 mg total) by mouth at bedtime.   azithromycin 250 MG tablet Commonly known as:  ZITHROMAX Z-PAK 2 tabs a day the first day, then 1 tab a day x 4 days   B-COMPLEX PO Take 1 tablet by mouth daily.   benzonatate 200 MG capsule Commonly known as:  TESSALON Take 1 capsule (200 mg total) by mouth 3 (three) times daily as needed for cough.   co-enzyme Q-10 30 MG capsule Take 30 mg by mouth daily.   ELIQUIS 5 MG Tabs tablet Generic drug:  apixaban TAKE ONE TABLET BY MOUTH TWICE DAILY   fenofibrate 48 MG tablet Commonly known as:   TRICOR Take 1 tablet (48 mg total) by mouth daily.   Fish Oil 1200 MG Caps Take 1,200 mg by mouth daily.   lisinopril 20 MG tablet Commonly known as:  PRINIVIL,ZESTRIL Take 0.5 tablets (10 mg total) by mouth daily.   magnesium oxide 400 MG tablet Commonly known as:  MAG-OX Take 800 mg by mouth daily.   metoprolol succinate 100 MG 24 hr tablet Commonly known as:  TOPROL-XL Take 1 tablet (100 mg total) by mouth daily. TAKE WITH OR IMMEDIATELY FOLLOWING A MEAL.   multivitamin with minerals tablet Take 1 tablet by mouth daily.   Vitamin D3 5000 units Caps Take 5,000 Units by mouth daily.          Objective:   Physical Exam BP 124/76 (BP Location: Left Arm, Patient Position: Sitting, Cuff Size: Normal)   Pulse 66   Temp 98.1 F (36.7 C) (Oral)   Resp 14   Ht 5\' 9"  (1.753 m)   Wt 238 lb (108 kg)   SpO2 96%   BMI 35.15 kg/m  General:   Well developed, well nourished . NAD.  HEENT:  Normocephalic . Face symmetric, atraumatic. TMs obscured by wax. Throat symmetric no red. Nose is slightly congested, sinuses no TTP Lungs:  Few rhonchi with cough otherwise CTA B Normal respiratory effort, no intercostal retractions, no accessory muscle use. Heart: RRR,  no murmur.  No pretibial edema bilaterally  Skin: Not pale. Not jaundice Neurologic:  alert & oriented X3.  Speech normal, gait appropriate for age and unassisted Psych--  Cognition and judgment appear intact.  Cooperative with normal attention span and concentration.  Behavior appropriate. No anxious or depressed appearing.      Assessment & Plan:   Assessment HTN Hyperlipidemia CV: ---CAD >>>  MI, CABG  ---CHF ---Peripheral vascular disease ---AAA --- A. Fib, paroxysmal  GERD  Chronic constipation OSA H/o skin cancer, Dr. [name] H/o Kidney stones 2016  PLAN: Persistent cough: Atypical bronchi tis? No evidence of pneumonia. Will proceed with second round of antibiotics with Zithromax. See  instructions. States he won't take codeine, does not like that medication. Has an appointment to see me in few weeks for a CPX.

## 2016-11-08 NOTE — Assessment & Plan Note (Signed)
Persistent cough: Atypical bronchi tis? No evidence of pneumonia. Will proceed with second round of antibiotics with Zithromax. See instructions. States he won't take codeine, does not like that medication. Has an appointment to see me in few weeks for a CPX.

## 2016-11-09 ENCOUNTER — Telehealth: Payer: Self-pay | Admitting: Internal Medicine

## 2016-11-09 NOTE — Telephone Encounter (Signed)
06/19/15 [place] PPPS, SUBSEQ VISIT [id number] patient scheduled medicare wellness appointment 12/06/16 with Hoyle Sauer and physical with PCP directly after.

## 2016-11-14 DIAGNOSIS — L570 Actinic keratosis: Secondary | ICD-10-CM | POA: Diagnosis not present

## 2016-11-14 DIAGNOSIS — D485 Neoplasm of uncertain behavior of skin: Secondary | ICD-10-CM | POA: Diagnosis not present

## 2016-11-18 DIAGNOSIS — H25013 Cortical age-related cataract, bilateral: Secondary | ICD-10-CM | POA: Diagnosis not present

## 2016-11-18 DIAGNOSIS — H2513 Age-related nuclear cataract, bilateral: Secondary | ICD-10-CM | POA: Diagnosis not present

## 2016-11-18 DIAGNOSIS — H43393 Other vitreous opacities, bilateral: Secondary | ICD-10-CM | POA: Diagnosis not present

## 2016-11-18 DIAGNOSIS — H30012 Focal chorioretinal inflammation, juxtapapillary, left eye: Secondary | ICD-10-CM | POA: Diagnosis not present

## 2016-12-04 DIAGNOSIS — G4733 Obstructive sleep apnea (adult) (pediatric): Secondary | ICD-10-CM | POA: Diagnosis not present

## 2016-12-05 ENCOUNTER — Encounter: Payer: Self-pay | Admitting: Internal Medicine

## 2016-12-05 NOTE — Telephone Encounter (Signed)
error:[id number] ° °

## 2016-12-06 ENCOUNTER — Encounter: Payer: Self-pay | Admitting: Internal Medicine

## 2016-12-06 ENCOUNTER — Ambulatory Visit (INDEPENDENT_AMBULATORY_CARE_PROVIDER_SITE_OTHER): Payer: Medicare HMO | Admitting: Internal Medicine

## 2016-12-06 VITALS — BP 138/82 | HR 56 | Resp 16 | Ht 69.0 in | Wt 242.4 lb

## 2016-12-06 DIAGNOSIS — Z0000 Encounter for general adult medical examination without abnormal findings: Secondary | ICD-10-CM

## 2016-12-06 DIAGNOSIS — R200 Anesthesia of skin: Secondary | ICD-10-CM | POA: Diagnosis not present

## 2016-12-06 DIAGNOSIS — H318 Other specified disorders of choroid: Secondary | ICD-10-CM | POA: Diagnosis not present

## 2016-12-06 DIAGNOSIS — N529 Male erectile dysfunction, unspecified: Secondary | ICD-10-CM

## 2016-12-06 DIAGNOSIS — H43813 Vitreous degeneration, bilateral: Secondary | ICD-10-CM | POA: Diagnosis not present

## 2016-12-06 DIAGNOSIS — H2513 Age-related nuclear cataract, bilateral: Secondary | ICD-10-CM | POA: Diagnosis not present

## 2016-12-06 DIAGNOSIS — R600 Localized edema: Secondary | ICD-10-CM | POA: Diagnosis not present

## 2016-12-06 DIAGNOSIS — G629 Polyneuropathy, unspecified: Secondary | ICD-10-CM

## 2016-12-06 DIAGNOSIS — Z0001 Encounter for general adult medical examination with abnormal findings: Secondary | ICD-10-CM

## 2016-12-06 DIAGNOSIS — R202 Paresthesia of skin: Secondary | ICD-10-CM

## 2016-12-06 DIAGNOSIS — K5909 Other constipation: Secondary | ICD-10-CM

## 2016-12-06 NOTE — Progress Notes (Signed)
Subjective:    Patient ID: Philip [name], male    DOB: 04-21-1941, 76 y.o.   MRN: [id number]  DOS:  12/06/2016 Type of visit - description : CPX Interval history: We managed his chronic medical problems. Also has several concerns: Several year history of numbness, tingling at the tip of the toes, symmetric. Not sure if worse at night. Also when he plays golf or take a long walk develops left hip pain with no radiation. Denies claudication Continue with problems in the sexual area, difficulty with ejaculation, decrease sensitivity but not numbness at the GU area.   Concern about his weight. Also concerned about persistent lower extremity edema  Review of Systems   Constitutional: No fever. No chills. No unexplained wt changes. No unusual sweats  HEENT: No dental problems, no ear discharge, no facial swelling, no voice changes. No eye discharge, no eye  redness , no  intolerance to light   Respiratory: No wheezing , no  difficulty breathing. No cough , no mucus production  Cardiovascular: No CP, no  Palpitations  GI: no nausea, no vomiting, no diarrhea , no  abdominal pain.  No blood in the stools. No dysphagia, no odynophagia    Endocrine: No polyphagia, no polyuria , no polydipsia  GU: No dysuria, gross hematuria, difficulty urinating. See HPI  Musculoskeletal: No joint swellings or unusual aches or pains  Skin: No change in the color of the skin, palor , no  Rash  Allergic, immunologic: No environmental allergies , no  food allergies  Neurological: No dizziness no  syncope. No headaches. No diplopia, no slurred, no slurred speech, no motor deficits, see HPI  Hematological: No enlarged lymph nodes, no easy bruising , no unusual bleedings  Psychiatry: No suicidal ideas, no hallucinations, no beavior problems, no confusion.  No unusual/severe anxiety, no depression    Past Medical History:  Diagnosis Date  . AAA (abdominal aortic aneurysm) ([place])   . Atrial fibrillation  ([place])   . CAD (coronary artery disease)    had a MI , s/p CABG  . Cataracts, bilateral    immature  . CHF (congestive heart failure) ([place])   . Dysrhythmia    paroximal a fib  . GERD (gastroesophageal reflux disease)    takes Omeprazole daily  . History of colon polyps   . History of kidney stones   . History of shingles   . Hyperlipidemia   . Hypertension    takes Amlodipine,Metoprolol,and Lisinopril daily  . Joint pain   . Joint swelling   . Myocardial infarction    several with last one being 2001(but never knew about any except 2001)  . OSA (obstructive sleep apnea)    started CPAP 12/09  . Peripheral vascular disease ([place])   . Pneumonia    as a child  . Skin cancer    several , one of them was melanoma (sees derm routinely)  . Tingling    feet occasionally  . Tinnitus     Past Surgical History:  Procedure Laterality Date  . ABDOMINAL AORTAGRAM N/A 06/16/2014   Procedure: ABDOMINAL Maxcine Ham;  Surgeon: Angelia Mould, MD;  Location: [facility] CATH LAB;  Service: Cardiovascular;  Laterality: N/A;  . ABDOMINAL AORTIC ENDOVASCULAR STENT GRAFT N/A 07/08/2014   Procedure: ABDOMINAL AORTIC ENDOVASCULAR STENT GRAFT/ GORE;  Surgeon: Angelia Mould, MD;  Location: [place];  Service: Vascular;  Laterality: N/A;  . CARDIAC CATHETERIZATION  2015  . COLONOSCOPY    . CORONARY ARTERY BYPASS GRAFT  1999   x 3 vessels  . LEFT HEART CATHETERIZATION WITH CORONARY/GRAFT ANGIOGRAM N/A 05/29/2014   Procedure: LEFT HEART CATHETERIZATION WITH [name];  Surgeon: [name], MD;  Location: [facility] CATH LAB;  Service: Cardiovascular;  Laterality: N/A;  . PENILE PROSTHESIS IMPLANT  08/2005  . RHINOPLASTY  1975    Social History   Social History  . Marital status: Married    Spouse name: N/A  . Number of children: 3  . Years of education: N/A   Occupational History  . retired, Actor    Social History Main Topics  . Smoking status: Former Research scientist (life sciences)  .  Smokeless tobacco: Never Used     Comment: quit smoking in 1979  . Alcohol use Yes     Comment: occasionally  . Drug use: No  . Sexual activity: Not Currently   Other Topics Concern  . Not on file   Social History Narrative   Lives w/ wife        Family History  Problem Relation Age of Onset  . Prostate cancer Father 25  . Heart disease Father   . Skin cancer Father   . Heart attack Father   . Cancer Father   . Deep vein thrombosis Father   . Hyperlipidemia Father   . Hypertension Father   . Heart disease Mother   . Skin cancer Mother   . Heart attack Mother   . Cancer Mother   . Hyperlipidemia Mother   . Hypertension Mother   . Varicose Veins Mother   . Peripheral vascular disease Mother     amputation  . Sleep apnea Brother   . Hyperlipidemia Brother   . Hypertension Brother   . Colon cancer Other     aunt  . Skin cancer Brother   . Skin cancer Sister   . Cancer Sister   . Heart disease Sister   . Diabetes Neg Hx   . Stroke Neg Hx      Allergies as of 12/06/2016   No Known Allergies     Medication List       Accurate as of 12/06/16 11:59 PM. Always use your most recent med list.          amLODipine 10 MG tablet Commonly known as:  NORVASC Take 1 tablet (10 mg total) by mouth daily.   atorvastatin 40 MG tablet Commonly known as:  LIPITOR Take 1 tablet (40 mg total) by mouth at bedtime.   B-COMPLEX PO Take 1 tablet by mouth daily.   co-enzyme Q-10 30 MG capsule Take 30 mg by mouth daily.   ELIQUIS 5 MG Tabs tablet Generic drug:  apixaban TAKE ONE TABLET BY MOUTH TWICE DAILY   fenofibrate 48 MG tablet Commonly known as:  TRICOR Take 1 tablet (48 mg total) by mouth daily.   Fish Oil 1200 MG Caps Take 1,200 mg by mouth daily.   hydrocortisone 2.5 % cream Apply 1 application topically as needed.   lisinopril 20 MG tablet Commonly known as:  PRINIVIL,ZESTRIL Take 0.5 tablets (10 mg total) by mouth daily.   magnesium oxide 400 MG  tablet Commonly known as:  MAG-OX Take 800 mg by mouth daily.   metoprolol succinate 100 MG 24 hr tablet Commonly known as:  TOPROL-XL Take 1 tablet (100 mg total) by mouth daily. TAKE WITH OR IMMEDIATELY FOLLOWING A MEAL.   multivitamin with minerals tablet Take 1 tablet by mouth daily.   polyethylene glycol packet Commonly known as:  MIRALAX / GLYCOLAX Take 17 g by mouth daily.   psyllium 58.6 % packet Commonly known as:  METAMUCIL Take 1 packet by mouth daily.   Vitamin D3 5000 units Caps Take 5,000 Units by mouth daily.          Objective:   Physical Exam BP 138/82 (BP Location: Left Arm, Patient Position: Sitting, Cuff Size: Normal)   Pulse (!) 56   Resp 16   Ht 5\' 9"  (1.753 m)   Wt 242 lb 6.4 oz (110 kg)   SpO2 98%   BMI 35.80 kg/m   General:   Well developed, well nourished . NAD.  Neck: No  thyromegaly  HEENT:  Normocephalic . Face symmetric, atraumatic Lungs:  CTA B Normal respiratory effort, no intercostal retractions, no accessory muscle use. Heart: RRR,  no murmur.  +/+++ pretibial edema bilaterally Normal femoral pulses, bilateral pedal pulses present but decreased.  Abdomen:  Not distended, soft, non-tender. No rebound or rigidity. No bruit   Skin: Exposed areas without rash. Not pale. Not jaundice Neurologic:  alert & oriented X3.  Speech normal, gait appropriate for age and unassisted Strength symmetric and appropriate for age. DTRs absent. Psych: Cognition and judgment appear intact.  Cooperative with normal attention span and concentration.  Behavior appropriate. No anxious or depressed appearing.    Assessment & Plan:    Assessment HTN Hyperlipidemia CV: ---CAD >>>  MI, CABG - Dr Sela Hilding, next visot due 12-2016 ---CHF ---Peripheral vascular disease ---AAA - see procedures , [name], next visit due 01-2017 --- A. Fib, paroxysmal  GERD  Chronic constipation OSA- Cpap (Dr [name]) ED- penile implant H/o skin cancer, Dr.  [name] H/o Kidney stones 2016  PLAN:  HTN: Seems well-controlled on metoprolol, lisinopril, amlodipine. Patient does have some edema and will reassess in the next visit, consider change amlodipine. Hyperlipidemia: Continue TriCor and Lipitor. Checking labs CAD: Asx ED: cont w/ ED, h/o penile implant, offered a urology referral-- will  arrange  Neuropathy? Sx do suggest neuropathy. Will check a 123456, folic acid, 123456 and TSH. DDX includes PVD, neuro claudication; Refer to neurology. NCS to confirm?   DJD: Complaint of left hip pain with ambulation. For now recommend Tylenol. RTC 2 months, he has other minor concerns, needs to be seen  more often to address all concerns   Today, I spent more than  30  min with the patient (in addition to CPX): >50% of the time counseling regards his multiple complaints some of them chronic. We had a long conversation about erectile dysfunction (which his previous history, he will need a referral, we still spent a great deal ot time listenin to his concerns),  neuropathy (new issue), edema

## 2016-12-06 NOTE — Patient Instructions (Addendum)
Consider keeping a food journal and tracking what you are eating, when you eat it, and how much.   Keep a notepad by the bed to write down and thoughts/concerns that wake you up at night.  ========================  Come back fasting for blood work  Next visit in 2 months to continue addressing your concerns

## 2016-12-06 NOTE — Progress Notes (Signed)
Subjective:   Philip [name] is a 76 y.o. male who presents for Medicare Annual/Subsequent preventive examination.  Pt reports concerns w/ L hip pain/arthritis that limits him from walking/exercising as much as he would like to.  He is experiencing some ED-he would like to discuss treatment options w/ PCP at CPE following this appointment.  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 He is still working on wrapping up her estate and has had some increased stress related to this.  Review of Systems:  No ROS.  Medicare Wellness Visit.   Cardiac Risk Factors include: advanced age (>65men, >20 women);dyslipidemia;hypertension;male gender;obesity (BMI >30kg/m2)  Sleep patterns: gets up 0-1 times nightly to void and sleeps 5-6 hours nightly. Falls asleep easily. Has frequent nighttime awakenings and has trouble falling back to sleep. Experiences racing thoughts during the night. Wearing CPAP nightly.  Home Safety/Smoke Alarms: Feels safe in home. Smoke alarms in place. Radon detectors in place. Living environment; residence and Firearm Safety: Lives w/ wife. 2-story house, number of inside stairs: 2 flights, firearms stored safely. Seat Belt Safety/Bike Helmet: Wears seat belt.   Counseling:   Eye Exam- Dr. Peter Garter yearly and PRN. Recent mass found on retina-following w/ retinal specialist every 6 months.  Dental- Dr. Nicki Reaper every 6 months  Male:   CCS- last 10/02/14 at [facility]. 3 polyps removed. No recall on file.   PSA-  Lab Results  Component Value Date   PSA 3.39 06/19/2015   PSA 3.20 04/22/2014   PSA 1.88 09/14/2012       Objective:    Vitals: BP 138/82 (BP Location: Left Arm, Patient Position: Sitting, Cuff Size: Normal)   Pulse (!) 56   Resp 16   Ht 5\' 9"  (1.753 m)   Wt 242 lb 6.4 oz (110 kg)   SpO2 98%   BMI 35.80 kg/m   Body mass index is 35.8 kg/m.   Wt Readings from Last 3 Encounters:   12/06/16 242 lb 6.4 oz (110 kg)  11/07/16 238 lb (108 kg)  09/05/16 239 lb 3.2 oz (108.5 kg)   Tobacco History  Smoking Status  . Former Smoker  Smokeless Tobacco  . Never Used    Comment: quit smoking in 1979     Counseling given: Not Answered   Past Medical History:  Diagnosis Date  . AAA (abdominal aortic aneurysm) ([place])   . Atrial fibrillation ([place])   . CAD (coronary artery disease)    had a MI , s/p CABG  . Cataracts, bilateral    immature  . CHF (congestive heart failure) ([place])   . Dysrhythmia    paroximal a fib  . GERD (gastroesophageal reflux disease)    takes Omeprazole daily  . History of colon polyps   . History of kidney stones   . History of shingles   . Hyperlipidemia   . Hypertension    takes Amlodipine,Metoprolol,and Lisinopril daily  . Joint pain   . Joint swelling   . Myocardial infarction    several with last one being 2001(but never knew about any except 2001)  . OSA (obstructive sleep apnea)    started CPAP 12/09  . Peripheral vascular disease ([place])   . Pneumonia    as a child  . Skin cancer    several , one of them was melanoma (sees derm routinely)  . Tingling    feet occasionally  . Tinnitus  Past Surgical History:  Procedure Laterality Date  . ABDOMINAL AORTAGRAM N/A 06/16/2014   Procedure: ABDOMINAL Maxcine Ham;  Surgeon: Angelia Mould, MD;  Location: [facility] CATH LAB;  Service: Cardiovascular;  Laterality: N/A;  . ABDOMINAL AORTIC ENDOVASCULAR STENT GRAFT N/A 07/08/2014   Procedure: ABDOMINAL AORTIC ENDOVASCULAR STENT GRAFT/ GORE;  Surgeon: Angelia Mould, MD;  Location: [place];  Service: Vascular;  Laterality: N/A;  . CARDIAC CATHETERIZATION  2015  . COLONOSCOPY    . CORONARY ARTERY BYPASS GRAFT  1999   x 3 vessels  . LEFT HEART CATHETERIZATION WITH CORONARY/GRAFT ANGIOGRAM N/A 05/29/2014   Procedure: LEFT HEART CATHETERIZATION WITH [name];  Surgeon: [name], MD;  Location: [facility] CATH LAB;  Service:  Cardiovascular;  Laterality: N/A;  . PENILE PROSTHESIS IMPLANT  08/2005  . RHINOPLASTY  1975   Family History  Problem Relation Age of Onset  . Prostate cancer Father 7  . Heart disease Father   . Skin cancer Father   . Heart attack Father   . Cancer Father   . Deep vein thrombosis Father   . Hyperlipidemia Father   . Hypertension Father   . Heart disease Mother   . Skin cancer Mother   . Heart attack Mother   . Cancer Mother   . Hyperlipidemia Mother   . Hypertension Mother   . Varicose Veins Mother   . Peripheral vascular disease Mother     amputation  . Sleep apnea Brother   . Hyperlipidemia Brother   . Hypertension Brother   . Colon cancer Other     aunt  . Skin cancer Brother   . Skin cancer Sister   . Cancer Sister   . Heart disease Sister   . Diabetes Neg Hx   . Stroke Neg Hx    History  Sexual Activity  . Sexual activity: Not Currently    Outpatient Encounter Prescriptions as of 12/06/2016  Medication Sig  . amLODipine (NORVASC) 10 MG tablet Take 1 tablet (10 mg total) by mouth daily.  Marland Kitchen atorvastatin (LIPITOR) 40 MG tablet Take 1 tablet (40 mg total) by mouth at bedtime.  . B Complex-Biotin-FA (B-COMPLEX PO) Take 1 tablet by mouth daily.  . Cholecalciferol (VITAMIN D3) 5000 UNITS CAPS Take 5,000 Units by mouth daily.  Marland Kitchen co-enzyme Q-10 30 MG capsule Take 30 mg by mouth daily.  Marland Kitchen ELIQUIS 5 MG TABS tablet TAKE ONE TABLET BY MOUTH TWICE DAILY  . fenofibrate (TRICOR) 48 MG tablet Take 1 tablet (48 mg total) by mouth daily.  Marland Kitchen lisinopril (PRINIVIL,ZESTRIL) 20 MG tablet Take 0.5 tablets (10 mg total) by mouth daily.  . magnesium oxide (MAG-OX) 400 MG tablet Take 800 mg by mouth daily.  . metoprolol succinate (TOPROL-XL) 100 MG 24 hr tablet Take 1 tablet (100 mg total) by mouth daily. TAKE WITH OR IMMEDIATELY FOLLOWING A MEAL.  . Multiple Vitamins-Minerals (MULTIVITAMIN WITH MINERALS) tablet Take 1 tablet by mouth daily.  . Omega-3 Fatty Acids (FISH OIL) 1200 MG  CAPS Take 1,200 mg by mouth daily.  . polyethylene glycol (MIRALAX / GLYCOLAX) packet Take 17 g by mouth daily.  . psyllium (METAMUCIL) 58.6 % packet Take 1 packet by mouth daily.  . hydrocortisone 2.5 % cream Apply 1 application topically as needed.  . [DISCONTINUED] azithromycin (ZITHROMAX Z-PAK) 250 MG tablet 2 tabs a day the first day, then 1 tab a day x 4 days (Patient not taking: Reported on 12/06/2016)  . [DISCONTINUED] benzonatate (TESSALON) 200 MG capsule Take  1 capsule (200 mg total) by mouth 3 (three) times daily as needed for cough. (Patient not taking: Reported on 12/06/2016)   No facility-administered encounter medications on file as of 12/06/2016.     Activities of Daily Living In your present state of health, do you have any difficulty performing the following activities: 12/06/2016 12/21/2015  Hearing? N N  Vision? N N  Difficulty concentrating or making decisions? N N  Walking or climbing stairs? N N  Dressing or bathing? N N  Doing errands, shopping? N N  Preparing Food and eating ? N -  Using the Toilet? N -  In the past six months, have you accidently leaked urine? N -  Do you have problems with loss of bowel control? N -  Managing your Medications? N -  Managing your Finances? N -  Housekeeping or managing your Housekeeping? N -  Some recent data might be hidden    Patient Care Team: Colon Branch, MD as PCP - General [name], MD as Consulting Physician (Cardiology) Ardis Hughs, MD as Attending Physician (Urology) Angelia Mould, MD as Consulting Physician (Vascular Surgery) Rigoberto Noel, MD as Consulting Physician (Pulmonary Disease) Druscilla Brownie, MD as Consulting Physician (Dermatology) Camillo Flaming, OD as Consulting Physician (Optometry)   Assessment:    Physical assessment deferred to PCP.  Exercise Activities and Dietary recommendations Current Exercise Habits: The patient does not participate in regular exercise at present, Exercise  limited by: orthopedic condition(s) (L hip arthritis)  Diet (meal preparation, eat out, water intake, caffeinated beverages, dairy products, fruits and vegetables): well balanced, on average, 3 meals per day. Reports he probably does not enough vegetables. Eats most meals at home; goes out to lunch a few times weekly. Drinks mostly water, 2-3 cups of coffee in the mornings. Snacks on almonds or [employer] w/ cheese or PB during the day.  Breakfast: cereal w/ soy milk 3-4x weekly Lunch: varies; sandwich Dinner: soup/stew/gumbo. "I try not to pig out on anything."  Goals    . Weight (lb) < 200 lb (90.7 kg)      Fall Risk Fall Risk  12/06/2016 12/21/2015 06/19/2015 04/22/2014  Falls in the past year? No No No No   Depression Screen PHQ 2/9 Scores 12/06/2016 12/21/2015 06/19/2015 04/22/2014  PHQ - 2 Score 1 0 0 0    Cognitive Function MMSE - Mini Mental State Exam 12/06/2016  Orientation to time 5  Orientation to Place 5  Registration 3  Attention/ Calculation 5  Recall 3  Language- name 2 objects 2  Language- repeat 1  Language- follow 3 step command 3  Language- read & follow direction 1  Write a sentence 1  Copy design 1  Total score 30        Immunization History  Administered Date(s) Administered  . Influenza Split 09/15/2011, 09/14/2012  . Influenza Whole 08/06/2010  . Influenza, High Dose Seasonal PF 09/16/2015, 09/26/2016  . Influenza,inj,Quad PF,36+ Mos 10/01/2013  . Influenza-Unspecified 08/31/2014  . Pneumococcal Conjugate-13 04/22/2014  . Pneumococcal Polysaccharide-23 01/01/2010  . Td 04/22/2014   Screening Tests Health Maintenance  Topic Date Due  . ZOSTAVAX  05/20/2000  . TETANUS/TDAP  04/22/2024  . INFLUENZA VACCINE  Completed  . PNA vac Low Risk Adult  Completed      Plan:   Follow-up w/ PCP as directed.  During the course of the visit the patient was educated and counseled about the following appropriate screening and preventive services:  Vaccines to include Pneumoccal, Influenza, Hepatitis B, Td, Zostavax, HCV  Cardiovascular Disease  Colorectal cancer screening  Diabetes screening  Prostate Cancer Screening  Glaucoma screening  Nutrition counseling   Patient Instructions (the written plan) was given to the patient.    Dorrene German, RN  12/06/2016  Kathlene November, MD

## 2016-12-06 NOTE — Assessment & Plan Note (Addendum)
Td -- 2015; neumonia shot 2006 and 2011;  prevnar --2015; had a flu shot  zostavax-- got it at  [place]: Several Cscopes, had one in 2007 aprox. polyps x 6 ,  03-2009, hyperplastic polyps, 09-2014: polyps, pt told 5 years He has a family history of prostate cancer, DRE   PSA. wnl 2016 Diet and exercise discussed  Labs: CMP, FLP, CBC, TSH, 123456, folic acid, 123456.

## 2016-12-06 NOTE — Assessment & Plan Note (Signed)
Ongoing for patient. Reports having regular BMs on regimen of daily Metamucil and Miralax. Ongoing management per PCP.

## 2016-12-06 NOTE — Progress Notes (Signed)
Pre visit review using our clinic review tool, if applicable. No additional management support is needed unless otherwise documented below in the visit note. 

## 2016-12-07 ENCOUNTER — Other Ambulatory Visit (INDEPENDENT_AMBULATORY_CARE_PROVIDER_SITE_OTHER): Payer: Medicare HMO

## 2016-12-07 ENCOUNTER — Other Ambulatory Visit: Payer: Self-pay | Admitting: Cardiology

## 2016-12-07 DIAGNOSIS — Z0000 Encounter for general adult medical examination without abnormal findings: Secondary | ICD-10-CM

## 2016-12-07 DIAGNOSIS — G629 Polyneuropathy, unspecified: Secondary | ICD-10-CM | POA: Diagnosis not present

## 2016-12-07 LAB — COMPREHENSIVE METABOLIC PANEL
ALBUMIN: 4.4 g/dL (ref 3.5–5.2)
ALK PHOS: 47 U/L (ref 39–117)
ALT: 24 U/L (ref 0–53)
AST: 24 U/L (ref 0–37)
BILIRUBIN TOTAL: 0.6 mg/dL (ref 0.2–1.2)
BUN: 19 mg/dL (ref 6–23)
CALCIUM: 9.3 mg/dL (ref 8.4–10.5)
CHLORIDE: 105 meq/L (ref 96–112)
CO2: 28 mEq/L (ref 19–32)
CREATININE: 1.18 mg/dL (ref 0.40–1.50)
GFR: 63.7 mL/min (ref >60.00)
Glucose, Bld: 107 mg/dL — ABNORMAL HIGH (ref 70–99)
Potassium: 4.1 mEq/L (ref 3.5–5.1)
Sodium: 138 mEq/L (ref 135–145)
TOTAL PROTEIN: 7 g/dL (ref 6.0–8.3)

## 2016-12-07 LAB — CBC WITH DIFFERENTIAL/PLATELET
Basophils Absolute: 0.1 10*3/uL (ref 0.0–0.1)
Basophils Relative: 1 % (ref 0.0–3.0)
EOS PCT: 0.9 % (ref 0.0–5.0)
Eosinophils Absolute: 0.1 10*3/uL (ref 0.0–0.7)
HEMATOCRIT: 44.1 % (ref 39.0–52.0)
HEMOGLOBIN: 15.1 g/dL (ref 13.0–17.0)
LYMPHS PCT: 39.2 % (ref 12.0–46.0)
Lymphs Abs: 2.2 10*3/uL (ref 0.7–4.0)
MCHC: 34.3 g/dL (ref 30.0–36.0)
MCV: 93.4 fl (ref 78.0–100.0)
MONOS PCT: 8.1 % (ref 3.0–12.0)
Monocytes Absolute: 0.4 10*3/uL (ref 0.1–1.0)
NEUTROS ABS: 2.8 10*3/uL (ref 1.4–7.7)
Neutrophils Relative %: 50.8 % (ref 43.0–77.0)
PLATELETS: 185 10*3/uL (ref 150.0–400.0)
RBC: 4.72 Mil/uL (ref 4.22–5.81)
RDW: 13.6 % (ref 11.5–15.5)
WBC: 5.6 10*3/uL (ref 4.0–10.5)

## 2016-12-07 LAB — TSH: TSH: 4.36 u[IU]/mL (ref 0.35–4.50)

## 2016-12-07 LAB — LIPID PANEL
CHOLESTEROL: 127 mg/dL (ref 0–200)
HDL: 38.8 mg/dL — ABNORMAL LOW (ref >39.00)
LDL Cholesterol: 57 mg/dL (ref 0–99)
NonHDL: 88.03
TRIGLYCERIDES: 154 mg/dL — AB (ref 0.0–149.0)
Total CHOL/HDL Ratio: 3
VLDL: 30.8 mg/dL (ref 0.0–40.0)

## 2016-12-07 LAB — VITAMIN B12: Vitamin B-12: 758 pg/mL (ref 211–911)

## 2016-12-07 LAB — HEMOGLOBIN A1C: Hgb A1c MFr Bld: 6.1 % (ref 4.6–6.5)

## 2016-12-07 LAB — FOLATE: Folate: >23.4 ng/mL (ref >5.9)

## 2016-12-07 NOTE — Assessment & Plan Note (Signed)
HTN: Seems well-controlled on metoprolol, lisinopril, amlodipine. Patient does have some edema and will reassess in the next visit, consider change amlodipine. Hyperlipidemia: Continue TriCor and Lipitor. Checking labs CAD: Asx ED: cont w/ ED, h/o penile implant, offered a urology referral-- will  arrange  Neuropathy? Sx do suggest neuropathy. Will check a 123456, folic acid, 123456 and TSH. DDX includes PVD, neuro claudication; Refer to neurology. NCS to confirm?   DJD: Complaint of left hip pain with ambulation. For now recommend Tylenol. RTC 2 months, he has other minor concerns, needs to be seen  more often to address all concerns

## 2016-12-09 ENCOUNTER — Telehealth: Payer: Self-pay | Admitting: Internal Medicine

## 2016-12-09 NOTE — Telephone Encounter (Signed)
Please advise 

## 2016-12-09 NOTE — Telephone Encounter (Signed)
Agree, we talk about weight loss.  Please if him the contact information for the bariatric clinic.

## 2016-12-09 NOTE — Telephone Encounter (Signed)
Spoke w/ Pt, informed of [facility] telephone number [phone]. Also informed that [facility] has a Bariatric Clinic at their [place] in [place]. Pt will start w/ [place].

## 2016-12-09 NOTE — Telephone Encounter (Signed)
Patient called stating that he mentioned in his CPE that he would like to get on a weight management program. He stated that he did not get any information regarding this during his CPE. He is wondering if Dr. Larose Kells knows someone or somewhere he could go to start a structured weight loss program. Please advise  Phone: [phone]

## 2016-12-12 DIAGNOSIS — G4733 Obstructive sleep apnea (adult) (pediatric): Secondary | ICD-10-CM | POA: Diagnosis not present

## 2016-12-19 ENCOUNTER — Telehealth: Payer: Self-pay | Admitting: Internal Medicine

## 2016-12-19 NOTE — Telephone Encounter (Signed)
Pt would like to know if he would need a referral per his insurance [employer] for a weight loss facility? He said that the provider mentioned it to him but never followed up. Please advise further.    [phone]

## 2016-12-19 NOTE — Telephone Encounter (Signed)
We discussed weight loss clinic/facilities on 12/09/2016, see telephone note. In regards to needing referral, he will need to discuss with his insurance.

## 2016-12-22 ENCOUNTER — Encounter (INDEPENDENT_AMBULATORY_CARE_PROVIDER_SITE_OTHER): Payer: Self-pay | Admitting: Family Medicine

## 2017-01-01 ENCOUNTER — Other Ambulatory Visit: Payer: Self-pay | Admitting: Cardiology

## 2017-01-01 DIAGNOSIS — G4733 Obstructive sleep apnea (adult) (pediatric): Secondary | ICD-10-CM | POA: Diagnosis not present

## 2017-01-04 ENCOUNTER — Ambulatory Visit (INDEPENDENT_AMBULATORY_CARE_PROVIDER_SITE_OTHER): Payer: Self-pay | Admitting: Family Medicine

## 2017-01-04 ENCOUNTER — Encounter (INDEPENDENT_AMBULATORY_CARE_PROVIDER_SITE_OTHER): Payer: Self-pay

## 2017-01-06 ENCOUNTER — Encounter: Payer: Self-pay | Admitting: Internal Medicine

## 2017-01-06 ENCOUNTER — Ambulatory Visit (INDEPENDENT_AMBULATORY_CARE_PROVIDER_SITE_OTHER): Payer: Medicare HMO | Admitting: Internal Medicine

## 2017-01-06 VITALS — BP 140/84 | HR 62 | Ht 69.0 in | Wt 237.0 lb

## 2017-01-06 DIAGNOSIS — R5383 Other fatigue: Secondary | ICD-10-CM | POA: Diagnosis not present

## 2017-01-06 DIAGNOSIS — I2510 Atherosclerotic heart disease of native coronary artery without angina pectoris: Secondary | ICD-10-CM

## 2017-01-06 DIAGNOSIS — R0609 Other forms of dyspnea: Secondary | ICD-10-CM | POA: Diagnosis not present

## 2017-01-06 MED ORDER — AMLODIPINE BESYLATE 10 MG PO TABS: 10.0000 mg | ORAL_TABLET | Freq: Every day | ORAL | 1 refills | Status: DC

## 2017-01-06 MED ORDER — APIXABAN 5 MG PO TABS: 5.0000 mg | ORAL_TABLET | Freq: Two times a day (BID) | ORAL | 1 refills | Status: DC

## 2017-01-06 MED ORDER — FENOFIBRATE 48 MG PO TABS: 48.0000 mg | ORAL_TABLET | Freq: Every day | ORAL | 1 refills | Status: DC

## 2017-01-06 MED ORDER — METOPROLOL SUCCINATE ER 100 MG PO TB24: 100.0000 mg | ORAL_TABLET | Freq: Every day | ORAL | 1 refills | Status: DC

## 2017-01-06 MED ORDER — ATORVASTATIN CALCIUM 40 MG PO TABS: 40.0000 mg | ORAL_TABLET | Freq: Every day | ORAL | 1 refills | Status: DC

## 2017-01-06 MED ORDER — LISINOPRIL 10 MG PO TABS
10.0000 mg | ORAL_TABLET | Freq: Every day | ORAL | 1 refills | Status: DC
Start: 2017-01-06 — End: 2017-07-09

## 2017-01-06 NOTE — Progress Notes (Signed)
Patient ID: Philip [name], male   DOB: May 13, 1941, 76 y.o.   MRN: [id number]  PCP: Dr. Larose [name]  76 yo with history of CAD s/p CABG in 1999 presents for cardiology followup.  He noted a prominent abdominal pulsation when using his inversion table and told his PCP about this.  Therefore, he had an abdominal ultrasound with a 7 cm AAA noted. He was seen by Dr Philip [name] for discussion of repair and then referred back to this office for pre-operative evaluation.  He was seen by the PA in this office in 7/15, and at that appointment was noted to be in atrial fibrillation with controlled rate.  He had not noticed any palpitations or change in his baseline symptoms. He was not started on anticoagulation given plan for AAA surgery in the near future.  At subsequent appointments (including today), he has been in NSR.  He was sent for a pre-op Lexiscan Cardiolite in 7/15. This study was intermediate risk, showing a reversible anterior and apical perfusion defect and a fixed basal to mid inferior defect with EF 48%.  Echo was done to reassess EF.  This showed EF 55-60%.  I did LHC in 7/15 given abnormal Cardiolite.  This showed patent grafts.  In 8/67, he had uncomplicated endovascular repair of his AAA. 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   Patient was last seen a year ago by Dr. [name] [name]. Today, he notes that he has been feeling somewhat lethargic since that time. He attributes this to a combination of medications and weight gain. He is trying to walk 30 minutes a day most days and also plays golf regularly. He has scheduled appointment with a weight loss clinic in the next few weeks. He notes mild swelling in his ankles at times. He has not had any chest pain or shortness of breath, but notes that his anginal equivalent prior to CABG was a burning sensation in his lungs as well as nausea. He has not felt anything reminiscent of this and recent memory. He is wearing CPAP  regularly now, tolerating the nasal pillow better than a full face mask.  ECG (05/07/14): Atrial fibrillation with PVCs ECG (05/20/14): NSR, RAE ECG (06/18/14): NSR, RAE, IVCD ECG (08/04/14): NSR, LVH, old inferior MI ECG (4/16): NSR, LVH ECG (1/17): NSR, LVH, inferior Qs ECG (3/18): NSR with first-degree AV block and LVH with QRS widening. Previously noted PACs or not present on today's tracing.  Labs (11/13): K 4.5, creatinine 1.1, LDL 74, HDL 36 Labs (8/14): K 4.4, creatinine 1.2, LDL 53, HDL 36 Labs (1/15): LDL 54, HDL 37 Labs (6/15): K 4.6, creatinine 1.6 Labs (8/15): K 3.9, creatinine 1.1 Labs (9/15): K 4.5, creatinine 0.95, HCT 38.7 Labs (1/16): HCT 45.2, K 4.5, creatinine 1.2, HDL 35, TGs 321, LDL 63 Labs (6/16): LDL 68, HDL 36 Labs (7/16): K 4.5, creatinine 1.18 Labs (2/18): K4.1, creatinine 1.18, LDL 57, HDL 39, triglycerides 154, hemoglobin 15.1  PMH: 1. Hyperlipidemia 2. HTN 3. CAD s/p CABG 1999 with LIMA-LAD, SVG-LCx, SVG-RCA.  [place] 2009 with 3/3 grafts patent. Lexiscan Cardiolite (7/15) with EF 48%, reversible anterior and apical perfusion defect and a fixed basal to mid inferior defect.  Echo (7/15) with EF 55-60%, mild LAE.  LHC (7/15) with total occlusion of LM and RCA; SVG-OM patent, SVG-RCA patent, LIMA-LAD patent.  4. H/o melanoma 5. OSA on CPAP 6. AAA: s/p endovascular repair in 9/15. Type II endoleak noted .  7. Atrial fibrillation: Paroxysmal, 1st noted in 7/15.  8. CKD  SH: Retired, married, 3 kids, quit smoking in 1978.  Lives in [place].   FH: Mother with CAD.   ROS: All systems reviewed and negative except as per HPI.   Current Outpatient Prescriptions  Medication Sig Dispense Refill  . amLODipine (NORVASC) 10 MG tablet TAKE 1 TABLET (10 MG TOTAL) BY MOUTH DAILY. 30 tablet 0  . atorvastatin (LIPITOR) 40 MG tablet TAKE 1 TABLET (40 MG TOTAL) BY MOUTH AT BEDTIME. 30 tablet 0  . B Complex-Biotin-FA (B-COMPLEX PO) Take 1 tablet by mouth daily.    .  Cholecalciferol (VITAMIN D3) 5000 UNITS CAPS Take 5,000 Units by mouth daily.    Marland Kitchen co-enzyme Q-10 30 MG capsule Take 30 mg by mouth daily.    Marland Kitchen ELIQUIS 5 MG TABS tablet TAKE ONE TABLET BY MOUTH TWICE DAILY 60 tablet 4  . fenofibrate (TRICOR) 48 MG tablet TAKE 1 TABLET (48 MG TOTAL) BY MOUTH DAILY. 90 tablet 0  . hydrocortisone 2.5 % cream Apply 1 application topically as needed.    Marland Kitchen lisinopril (PRINIVIL,ZESTRIL) 20 MG tablet Take 0.5 tablets (10 mg total) by mouth daily. 45 tablet 2  . magnesium oxide (MAG-OX) 400 MG tablet Take 800 mg by mouth daily.    . metoprolol succinate (TOPROL-XL) 100 MG 24 hr tablet Take 1 tablet (100 mg total) by mouth daily. TAKE WITH OR IMMEDIATELY FOLLOWING A MEAL. 90 tablet 3  . Multiple Vitamins-Minerals (MULTIVITAMIN WITH MINERALS) tablet Take 1 tablet by mouth daily.    . Omega-3 Fatty Acids (FISH OIL) 1200 MG CAPS Take 1,200 mg by mouth daily.    . polyethylene glycol (MIRALAX / GLYCOLAX) packet Take 17 g by mouth daily.    . psyllium (METAMUCIL) 58.6 % packet Take 1 packet by mouth daily.     No current facility-administered medications for this visit.     BP 140/84 (BP Location: Left Arm, Patient Position: Sitting, Cuff Size: Normal)   Pulse 62   Ht 5\' 9"  (1.753 m)   Wt 237 lb (107.5 kg)   SpO2 97%   BMI 35.00 kg/m  General: Obese man, seated comfortably in the exam room. Neck: Supple without lymphadenopathy, thyromegaly, JVD, HJR, or carotid bruits. Lungs: Clear to auscultation bilaterally without wheezes or crackles. Normal work of breathing. Heart: Regular rate and rhythm without murmurs, rubs, or gallops. Nondisplaced PMI. Abdomen: Soft, nontender, no hepatosplenomegaly, no distention. Bowel sounds present. Skin: Intact without lesions or rashes.  Neurologic: Alert and oriented x 3. No focal deficits. Psych: Normal affect. Extremities: No clubbing or cyanosis. Trace pretibial edema. 2+ radial, posterior tibial, and dorsalis pedis pulses  bilaterally.  Assessment/Plan 1. CAD: s/p CABG in 1999. No recurrent chest pain or "burning lungs" recently. However, patient has noticed some fatigue and lethargy over the last year. Given that his CABG is approaching 76 years old, we have agreed to obtain an exercise myocardial perfusion stress test. Of note, prior stress test in 2015 was abnormal; we will be reassured with a stable exam. We will continue with current regimen for secondary prevention. Patient is not on aspirin owing to long-term apixaban use. I think weight loss will be very beneficial for Mr. Staron, though it is prudent to exclude worsening ischemia before beginning an aggressive diet/exercise regimen. 2. HTN: Blood pressure borderline elevated but apically normal at home. We will not make any changes today. 3. Hyperlipidemia: Continue atorvastatin and fenofibrate.. 4. AAA: s/p uncomplicated endovascular  repair in 9/15. Patient to continue follow-up with Dr. Doren Custard. 5. Paroxysmal atrial fibrillation. EKG today demonstrates normal sinus rhythm. Patient tolerating apixaban well with CHADSVASC score of 4 (Age x 2, HTN, ASCVD).  Follow-up: Return to clinic in 3 months.   Aleksandr Pellow 01/06/2017

## 2017-01-06 NOTE — Patient Instructions (Signed)
Medication Instructions:  Your physician recommends that you continue on your current medications as directed. Please refer to the Current Medication list given to you today.    Labwork: None   Testing/Procedures: Your physician has requested that you have en exercise stress myoview. For further information please visit [email or web address]. Please follow instruction sheet, as given.    Follow-Up: Your physician recommends that you schedule a follow-up appointment in: 3 months with Dr End.        If you need a refill on your cardiac medications before your next appointment, please call your pharmacy.   

## 2017-01-07 ENCOUNTER — Encounter: Payer: Self-pay | Admitting: Internal Medicine

## 2017-01-10 ENCOUNTER — Telehealth (HOSPITAL_COMMUNITY): Payer: Self-pay

## 2017-01-12 ENCOUNTER — Telehealth: Payer: Self-pay | Admitting: Internal Medicine

## 2017-01-12 ENCOUNTER — Ambulatory Visit (HOSPITAL_COMMUNITY): Payer: Medicare HMO | Attending: Cardiovascular Disease

## 2017-01-12 DIAGNOSIS — R5383 Other fatigue: Secondary | ICD-10-CM | POA: Diagnosis not present

## 2017-01-12 DIAGNOSIS — R9430 Abnormal result of cardiovascular function study, unspecified: Secondary | ICD-10-CM | POA: Insufficient documentation

## 2017-01-12 DIAGNOSIS — R0609 Other forms of dyspnea: Secondary | ICD-10-CM | POA: Diagnosis not present

## 2017-01-12 DIAGNOSIS — I2510 Atherosclerotic heart disease of native coronary artery without angina pectoris: Secondary | ICD-10-CM | POA: Diagnosis not present

## 2017-01-12 LAB — MYOCARDIAL PERFUSION IMAGING
CHL CUP MPHR: 144 {beats}/min
CHL CUP NUCLEAR SDS: 3
CHL CUP NUCLEAR SRS: 7
CHL CUP NUCLEAR SSS: 10
CHL CUP RESTING HR STRESS: 64 {beats}/min
CSEPED: 8 min
CSEPEW: 10.1 METS
CSEPHR: 100 %
Exercise duration (sec): 0 s
LHR: 0.26
LV dias vol: 142 mL (ref 62–150)
LV sys vol: 76 mL
Peak HR: 144 {beats}/min
TID: 0.86

## 2017-01-12 MED ORDER — TECHNETIUM TC 99M TETROFOSMIN IV KIT
32.5000 | PACK | Freq: Once | INTRAVENOUS | Status: AC | PRN
  Administered 2017-01-12: 32.5 via INTRAVENOUS
  Filled 2017-01-12: qty 33

## 2017-01-12 MED ORDER — TECHNETIUM TC 99M TETROFOSMIN IV KIT
10.9000 | PACK | Freq: Once | INTRAVENOUS | Status: AC | PRN
  Administered 2017-01-12: 10.9 via INTRAVENOUS
  Filled 2017-01-12: qty 11

## 2017-01-12 NOTE — Telephone Encounter (Signed)
Left message on patient's voicemail to call [facility] office to discuss results of stress test. Small anterior and anteroseptal defects were unchanged from prior stress test. Overall study is stable to improved from prior. We will continue with medical therapy. It is reasonable to proceed with his diet and exercise plans, as I think weight loss will help improve his energy.  Nelva Bush, MD [facility] Pager: [phone]

## 2017-01-13 NOTE — Telephone Encounter (Signed)
New Message    Pt states that Dr End left a message on his voicemail, to have him paged.

## 2017-01-13 NOTE — Telephone Encounter (Signed)
I spoke with patient regarding the results of his stress test, which are stable to improved from previous study. Catheterization following prior stress test showed patent grafts. His fatigue is likely related to deconditioning and weight gain. We will continue his current cardiac medications. I think it is reasonable for him to proceed with his weight loss plans. We will follow-up as previously scheduled in 03/2017.  Nelva Bush, MD [facility] Pager: [phone]

## 2017-01-18 ENCOUNTER — Ambulatory Visit (INDEPENDENT_AMBULATORY_CARE_PROVIDER_SITE_OTHER): Payer: Medicare HMO | Admitting: Family Medicine

## 2017-01-18 ENCOUNTER — Encounter (INDEPENDENT_AMBULATORY_CARE_PROVIDER_SITE_OTHER): Payer: Self-pay | Admitting: Family Medicine

## 2017-01-18 VITALS — BP 143/89 | HR 85 | Temp 97.7°F | Resp 14 | Ht 69.0 in | Wt 230.0 lb

## 2017-01-18 DIAGNOSIS — Z6834 Body mass index (BMI) 34.0-34.9, adult: Secondary | ICD-10-CM

## 2017-01-18 DIAGNOSIS — E785 Hyperlipidemia, unspecified: Secondary | ICD-10-CM

## 2017-01-18 DIAGNOSIS — Z0289 Encounter for other administrative examinations: Secondary | ICD-10-CM

## 2017-01-18 DIAGNOSIS — Z1389 Encounter for screening for other disorder: Secondary | ICD-10-CM | POA: Diagnosis not present

## 2017-01-18 DIAGNOSIS — R5383 Other fatigue: Secondary | ICD-10-CM | POA: Diagnosis not present

## 2017-01-18 DIAGNOSIS — E559 Vitamin D deficiency, unspecified: Secondary | ICD-10-CM

## 2017-01-18 DIAGNOSIS — R0602 Shortness of breath: Secondary | ICD-10-CM

## 2017-01-18 DIAGNOSIS — I4891 Unspecified atrial fibrillation: Secondary | ICD-10-CM

## 2017-01-18 DIAGNOSIS — Z9189 Other specified personal risk factors, not elsewhere classified: Secondary | ICD-10-CM

## 2017-01-18 DIAGNOSIS — I509 Heart failure, unspecified: Secondary | ICD-10-CM

## 2017-01-18 DIAGNOSIS — I25810 Atherosclerosis of coronary artery bypass graft(s) without angina pectoris: Secondary | ICD-10-CM

## 2017-01-18 DIAGNOSIS — I10 Essential (primary) hypertension: Secondary | ICD-10-CM

## 2017-01-18 DIAGNOSIS — E669 Obesity, unspecified: Secondary | ICD-10-CM | POA: Diagnosis not present

## 2017-01-18 DIAGNOSIS — Z1331 Encounter for screening for depression: Secondary | ICD-10-CM

## 2017-01-18 NOTE — Progress Notes (Signed)
Office: [phone]  /  Fax: [phone]   HPI:   Chief Complaint: OBESITY  Philip [name] (MR# [id number]) is a 76 y.o. male who presents on 01/18/2017 for obesity evaluation and treatment. Current BMI is Body mass index is 33.97 kg/m.Marland Kitchen Philip [name] has struggled with obesity for years and has been unsuccessful in either losing weight or maintaining long term weight loss. Philip [name] attended our information session and states he is currently in the action stage of change and ready to dedicate time achieving and maintaining a healthier weight.  Philip [name] states his family eats meals together he thinks his family will eat healthier with  him his desired weight loss is 40 to 50 lbs he started gaining weight around 76 years of age his heaviest weight ever was 245 lbs. he has significant food cravings issues  he snacks frequently in the evenings he skips meals frequently he frequently makes poor food choices he has problems with excessive hunger    Fatigue Philip [name] feels his energy is lower than it should be. This has worsened with weight gain and has not worsened recently. Philip [name] admits to daytime somnolence and  denies waking up still tired. Patient is at risk for obstructive sleep apnea. Patent has a history of symptoms of daytime fatigue and hypertension. Patient generally gets 5 or 6 hours of sleep per night, and states they generally have generally restful sleep. Snoring is present. Apneic episodes are present. Epworth Sleepiness Score is 6  Dyspnea on exertion Philip [name] notes increasing shortness of breath with exercising and seems to be worsening over time with weight gain. He notes getting out of breath sooner with activity than he used to. This has not gotten worse recently. Philip [name] denies orthopnea.  Vitamin D deficiency Philip [name] has a diagnosis of vitamin D deficiency. He is currently taking OTC vit D 1,000 IU daily and denies nausea, vomiting or muscle weakness.  Hypertension Philip [name] is a  76 y.o. male with hypertension. Blood pressure is slightly elevated today at 143/86 which is acceptable for his age. Philip [name] denies chest pain or lightheadedness. He is working weight loss to help control his blood pressure with the goal of decreasing his risk of heart attack and stroke. Philip [name] blood pressure is currently controlled.  Hyperlipidemia Philip [name] has hyperlipidemia and is on Lipitor. He has been trying to improve his cholesterol levels with intensive lifestyle modification including a low saturated fat diet, exercise and weight loss. He denies any chest pain, claudication or myalgias. He has a positive history of coronary artery disease, congestive heart failure and atrial fibrillation.  Atrial Fibrillation Rate Controlled Philip [name] is currently on Eliquis, Norvasc and Metoprolol, rate is controlled. He denies chest pain or shortness of breath.  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 He has a history of hypertension, hyperlipidemia, and atrial fibrillation.  Congestive Heart Failure Philip [name] has a previous diagnosis of congestive heart failure but echocardiogram in 2015 shows ejection fraction 97% with no diastolic dysfunction or left ventricular hypertrophy.   Depression Screen Philip [name]'s Food and Mood (modified PHQ-9) score was  Depression screen PHQ 2/9 01/18/2017  Decreased Interest 3  Down, Depressed, Hopeless 1  PHQ - 2 Score 4  Altered sleeping 2  Tired, decreased energy 2  Change in appetite 1  Feeling bad or failure about yourself  2  Trouble concentrating 1  Moving slowly or fidgety/restless 1  Suicidal thoughts 0  PHQ-9  Score 13    ALLERGIES: No Known Allergies  MEDICATIONS: Current Outpatient Prescriptions on File Prior to Visit  Medication Sig Dispense Refill  . amLODipine (NORVASC) 10 MG tablet Take 1 tablet (10 mg total) by mouth daily. 90 tablet 1  . apixaban (ELIQUIS) 5  MG TABS tablet Take 1 tablet (5 mg total) by mouth 2 (two) times daily. 180 tablet 1  . atorvastatin (LIPITOR) 40 MG tablet Take 1 tablet (40 mg total) by mouth at bedtime. 90 tablet 1  . B Complex-Biotin-FA (B-COMPLEX PO) Take 1 tablet by mouth daily.    . Cholecalciferol (VITAMIN D3) 5000 UNITS CAPS Take 5,000 Units by mouth daily.    Marland Kitchen co-enzyme Q-10 30 MG capsule Take 30 mg by mouth daily.    . fenofibrate (TRICOR) 48 MG tablet Take 1 tablet (48 mg total) by mouth daily. 90 tablet 1  . hydrocortisone 2.5 % cream Apply 1 application topically as needed.    Marland Kitchen lisinopril (PRINIVIL,ZESTRIL) 10 MG tablet Take 1 tablet (10 mg total) by mouth daily. 90 tablet 1  . magnesium oxide (MAG-OX) 400 MG tablet Take 800 mg by mouth daily.    . metoprolol succinate (TOPROL-XL) 100 MG 24 hr tablet Take 1 tablet (100 mg total) by mouth daily. TAKE WITH OR IMMEDIATELY FOLLOWING A MEAL. 90 tablet 1  . Multiple Vitamins-Minerals (MULTIVITAMIN WITH MINERALS) tablet Take 1 tablet by mouth daily.    . Omega-3 Fatty Acids (FISH OIL) 1200 MG CAPS Take 1,200 mg by mouth daily.    . polyethylene glycol (MIRALAX / GLYCOLAX) packet Take 17 g by mouth daily.    . psyllium (METAMUCIL) 58.6 % packet Take 1 packet by mouth daily.     No current facility-administered medications on file prior to visit.     PAST MEDICAL HISTORY: Past Medical History:  Diagnosis Date  . AAA (abdominal aortic aneurysm) ([place])   . Atrial fibrillation ([place])   . CAD (coronary artery disease)    had a MI , s/p CABG  . Cataracts, bilateral    immature  . CHF (congestive heart failure) ([place])   . Dysrhythmia    paroximal a fib  . GERD (gastroesophageal reflux disease)    takes Omeprazole daily  . History of colon polyps   . History of kidney stones   . History of shingles   . Hyperlipidemia   . Hypertension    takes Amlodipine,Metoprolol,and Lisinopril daily  . Joint pain   . Joint swelling   . Myocardial infarction    several with  last one being 2001(but never knew about any except 2001)  . OSA (obstructive sleep apnea)    started CPAP 12/09  . Peripheral vascular disease ([place])   . Pneumonia    as a child  . Skin cancer    several , one of them was melanoma (sees derm routinely)  . Tingling    feet occasionally  . Tinnitus     PAST SURGICAL HISTORY: Past Surgical History:  Procedure Laterality Date  . ABDOMINAL AORTAGRAM N/A 06/16/2014   Procedure: ABDOMINAL Maxcine Ham;  Surgeon: Angelia Mould, MD;  Location[facility] CATH LAB;  Service: Cardiovascular;  Laterality: N/A;  . ABDOMINAL AORTIC ENDOVASCULAR STENT GRAFT N/A 07/08/2014   Procedure: ABDOMINAL AORTIC ENDOVASCULAR STENT GRAFT/ GORE;  Surgeon: Angelia Mould, MD;  Location: [facility];  Service: Vascular;  Laterality: N/A;  . CARDIAC CATHETERIZATION  2015  . COLONOSCOPY    . [place]  x 3 vessels  . LEFT HEART CATHETERIZATION WITH CORONARY/GRAFT ANGIOGRAM N/A 05/29/2014   Procedure: LEFT HEART CATHETERIZATION WITH [name];  Surgeon: [name], MD;  Location: [facility] CATH LAB;  Service: Cardiovascular;  Laterality: N/A;  . PENILE PROSTHESIS IMPLANT  08/2005  . RHINOPLASTY  1975    SOCIAL HISTORY: Social History  Substance Use Topics  . Smoking status: Former Research scientist (life sciences)  . Smokeless tobacco: Never Used     Comment: quit smoking in 1979  . Alcohol use Yes     Comment: occasionally    FAMILY HISTORY: Family History  Problem Relation Age of Onset  . Prostate cancer Father 47  . Heart disease Father   . Skin cancer Father   . Heart attack Father   . Cancer Father   . Deep vein thrombosis Father   . Hyperlipidemia Father   . Hypertension Father   . Heart disease Mother   . Skin cancer Mother   . Heart attack Mother   . Cancer Mother   . Hyperlipidemia Mother   . Hypertension Mother   . Varicose Veins Mother   . Peripheral vascular disease Mother     amputation  . Sleep apnea Brother   .  Hyperlipidemia Brother   . Hypertension Brother   . Colon cancer Other     aunt  . Skin cancer Brother   . Skin cancer Sister   . Cancer Sister   . Heart disease Sister   . Diabetes Neg Hx   . Stroke Neg Hx     ROS: Review of Systems  Constitutional: Positive for malaise/fatigue.  HENT: Positive for congestion (Nasal Stuffiness) and tinnitus.        Dentures  Eyes:       Vision Changes Wear Glasses or Contacts Flashes of light Floaters   Respiratory: Positive for shortness of breath (on exertion).   Cardiovascular: Negative for chest pain, orthopnea and claudication.       Negative lightheadedness  Gastrointestinal: Negative for nausea and vomiting.  Musculoskeletal: Positive for joint pain. Negative for myalgias.       Negative muscle weakness Neck Stiffness  Endo/Heme/Allergies: Bruises/bleeds easily (Easy bruising, Easy bleeding).  Psychiatric/Behavioral: The patient has insomnia.     PHYSICAL EXAM: Blood pressure (!) 143/89, pulse 85, temperature 97.7 F (36.5 C), temperature source Oral, resp. rate 14, height 5\' 9"  (1.753 m), weight 230 lb (104.3 kg), SpO2 99 %. Body mass index is 33.97 kg/m. Physical Exam  Constitutional: He is oriented to person, place, and time. He appears well-developed and well-nourished.  Cardiovascular:  Irregular rate with rate control  Pulmonary/Chest: Effort normal.  Musculoskeletal: Normal range of motion.  Neurological: He is oriented to person, place, and time.  Skin: Skin is warm and dry.  Psychiatric: He has a normal mood and affect. His behavior is normal.  Vitals reviewed.   RECENT LABS AND TESTS: BMET    Component Value Date/Time   NA 138 12/07/2016 0852   K 4.1 12/07/2016 0852   CL 105 12/07/2016 0852   CO2 28 12/07/2016 0852   GLUCOSE 107 (H) 12/07/2016 0852   BUN 19 12/07/2016 0852   CREATININE 1.18 12/07/2016 0852   CREATININE 1.08 12/14/2015 1122   CALCIUM 9.3 12/07/2016 0852   GFRNONAA 37 (L) 04/14/2015 1925    GFRAA 42 (L) 04/14/2015 1925   Lab Results  Component Value Date   HGBA1C 6.1 12/07/2016   No results found for: INSULIN CBC    Component Value  Date/Time   WBC 5.6 12/07/2016 0852   RBC 4.72 12/07/2016 0852   HGB 15.1 12/07/2016 0852   HCT 44.1 12/07/2016 0852   PLT 185.0 12/07/2016 0852   PLT 156 04/07/2010   MCV 93.4 12/07/2016 0852   MCH 31.1 12/14/2015 1122   MCHC 34.3 12/07/2016 0852   RDW 13.6 12/07/2016 0852   LYMPHSABS 2.2 12/07/2016 0852   MONOABS 0.4 12/07/2016 0852   EOSABS 0.1 12/07/2016 0852   BASOSABS 0.1 12/07/2016 0852   Iron/TIBC/Ferritin/ %Sat No results found for: IRON, TIBC, FERRITIN, IRONPCTSAT Lipid Panel     Component Value Date/Time   CHOL 127 12/07/2016 0852   TRIG 154.0 (H) 12/07/2016 0852   TRIG 256 09/14/2009 0000   HDL 38.80 (L) 12/07/2016 0852   CHOLHDL 3 12/07/2016 0852   VLDL 30.8 12/07/2016 0852   LDLCALC 57 12/07/2016 0852   LDLDIRECT 68.0 04/13/2015 0823   Hepatic Function Panel     Component Value Date/Time   PROT 7.0 12/07/2016 0852   ALBUMIN 4.4 12/07/2016 0852   AST 24 12/07/2016 0852   ALT 24 12/07/2016 0852   ALKPHOS 47 12/07/2016 0852   BILITOT 0.6 12/07/2016 0852      Component Value Date/Time   TSH 4.36 12/07/2016 0852   TSH 3.86 04/22/2014 1623   TSH 3.33 11/25/2013 0946    ECG  shows NSR with a rate of 76 BPM INDIRECT CALORIMETER done today shows a VO2 of 307 and a REE of 2139.    ASSESSMENT AND PLAN: Other fatigue - Plan: EKG 12-Lead, Comprehensive metabolic panel, CBC With Differential, T3, T4, free, TSH  Shortness of breath on exertion  Essential hypertension  Hyperlipidemia, unspecified hyperlipidemia type - Plan: Lipid Panel With LDL/HDL Ratio  Atrial fibrillation, unspecified type (HCC) - Rate Controlled  Coronary artery disease involving other coronary artery bypass graft without angina pectoris  Congestive heart failure, unspecified congestive heart failure chronicity, unspecified  congestive heart failure type (HCC)  Vitamin D deficiency - Plan: VITAMIN D 25 Hydroxy (Vit-D Deficiency, Fractures)  At risk for diabetes mellitus - Plan: Hemoglobin A1c, Insulin, random  Depression screening  Class 1 obesity without serious comorbidity with body mass index (BMI) of 34.0 to 34.9 in adult, unspecified obesity type  PLAN:  Fatigue Ramere was informed that his fatigue may be related to obesity, depression or many other causes. Labs will be ordered, and in the meanwhile Kaesen has agreed to work on diet, exercise and weight loss to help with fatigue. Proper sleep hygiene was discussed including the need for 7-8 hours of quality sleep each night. A sleep study was not ordered based on symptoms and Epworth score.  Dyspnea on exertion Flemon's shortness of breath appears to be obesity related and exercise induced. He has agreed to work on weight loss and gradually increase exercise to treat his exercise induced shortness of breath. If Maximiano follows our instructions and loses weight without improvement of his shortness of breath, we will plan to refer to pulmonology. We will monitor this condition regularly. Philip [name] agrees to this plan.  Vitamin D Deficiency Philip [name] was informed that low vitamin D levels contributes to fatigue and are associated with obesity, breast, and colon cancer. He agrees to continue to take OTC Vit D 1,000 IU qd and will follow up for routine testing of vitamin D, at least 2-3 times per year. He was informed of the risk of over-replacement of vitamin D and agrees to not increase his dose unless he discusses this  with [place] first.  Hypertension We discussed sodium restriction, working on healthy weight loss, and a regular exercise program as the means to achieve improved blood pressure control. Philip [name] agreed with this plan and agreed to follow up as directed. We will continue to monitor his blood pressure as well as his progress with the above lifestyle  modifications. He will continue his medications as prescribed and will watch for signs of hypotension as he continues his lifestyle modifications. We will check labs and Caulin agrees to follow up with our clinic in 2 weeks.  Hyperlipidemia Philip [name] was informed of the American Heart Association Guidelines emphasizing intensive lifestyle modifications as the first line treatment for hyperlipidemia. We discussed many lifestyle modifications today in depth, and Philip [name] will continue to work on decreasing saturated fats such as fatty red meat, butter and many fried foods. He will also increase vegetables and lean protein in his diet and continue to work on exercise and weight loss efforts. We will check labs and follow.  Atrial Fibrillation Rate Controlled Philip [name] agrees to continue medications as directed and to work on diet, exercise and weight loss to help with atrial fibrillation. Philip [name] agrees to follow up with our clinic in 2 weeks.  Coronary Artery Disease Philip [name] agrees to work on diet, exercise and weight loss to help with coronary artery disease and follow up in 2 weeks.  Congestive Heart Failure Philip [name] agrees to continue medications as directed and work on diet, exercise and weight loss. We will check labs and he will follow up with our clinic in 2 weeks.  We spent > than 50% of the 60 minute visit on the counseling as documented in the note.  Depression Screen Philip [name] had a moderately positive depression screening. Depression is commonly associated with obesity and often results in emotional eating behaviors. We will monitor this closely and work on CBT to help improve the non-hunger eating patterns. Referral to Psychology may be required if no improvement is seen as he continues in our clinic.  Obesity Philip [name] is currently in the action stage of change and his goal is to continue with weight loss efforts He has agreed to follow the Category 3 plan Philip [name] has been instructed to work up to a  goal of 150 minutes of combined cardio and strengthening exercise per week for weight loss and overall health benefits. We discussed the following Behavioral Modification Stratagies today: increasing lean protein intake, increasing lower sugar fruits and dealing with family or coworker sabotage  Philip [name] has agreed to follow up with our clinic in 2 weeks. He was informed of the importance of frequent follow up visits to maximize his success with intensive lifestyle modifications for his multiple health conditions. He was informed we would discuss his lab results at his next visit unless there is a critical issue that needs to be addressed sooner. Philip [name] agreed to keep his next visit at the agreed upon time to discuss these results.  I, Doreene Nest, am acting as scribe for [name], MD  I have reviewed the above documentation for accuracy and completeness, and I agree with the above. -[name], MD

## 2017-01-19 LAB — VITAMIN D 25 HYDROXY (VIT D DEFICIENCY, FRACTURES): VIT D 25 HYDROXY: 52.7 ng/mL (ref 30.0–100.0)

## 2017-01-19 LAB — CBC WITH DIFFERENTIAL
BASOS ABS: 0 10*3/uL (ref 0.0–0.2)
Basos: 1 %
EOS (ABSOLUTE): 0.1 10*3/uL (ref 0.0–0.4)
Eos: 2 %
Hematocrit: 43.9 % (ref 37.5–51.0)
Hemoglobin: 15 g/dL (ref 13.0–17.7)
IMMATURE GRANULOCYTES: 0 %
Immature Grans (Abs): 0 10*3/uL (ref 0.0–0.1)
LYMPHS: 43 %
Lymphocytes Absolute: 2 10*3/uL (ref 0.7–3.1)
MCH: 31.5 pg (ref 26.6–33.0)
MCHC: 34.2 g/dL (ref 31.5–35.7)
MCV: 92 fL (ref 79–97)
MONOS ABS: 0.7 10*3/uL (ref 0.1–0.9)
Monocytes: 14 %
NEUTROS PCT: 40 %
Neutrophils Absolute: 1.9 10*3/uL (ref 1.4–7.0)
RBC: 4.76 x10E6/uL (ref 4.14–5.80)
RDW: 13.1 % (ref 12.3–15.4)
WBC: 4.7 10*3/uL (ref 3.4–10.8)

## 2017-01-19 LAB — LIPID PANEL WITH LDL/HDL RATIO
Cholesterol, Total: 134 mg/dL (ref 100–199)
HDL: 38 mg/dL — ABNORMAL LOW (ref >39)
LDL Calculated: 51 mg/dL (ref 0–99)
LDl/HDL Ratio: 1.3 ratio units (ref 0.0–3.6)
Triglycerides: 224 mg/dL — ABNORMAL HIGH (ref 0–149)
VLDL CHOLESTEROL CAL: 45 mg/dL — AB (ref 5–40)

## 2017-01-19 LAB — COMPREHENSIVE METABOLIC PANEL
ALBUMIN: 4.7 g/dL (ref 3.5–4.8)
ALK PHOS: 61 IU/L (ref 39–117)
ALT: 27 IU/L (ref 0–44)
AST: 32 IU/L (ref 0–40)
Albumin/Globulin Ratio: 2 (ref 1.2–2.2)
BUN / CREAT RATIO: 14 (ref 10–24)
BUN: 17 mg/dL (ref 8–27)
Bilirubin Total: 0.5 mg/dL (ref 0.0–1.2)
CHLORIDE: 100 mmol/L (ref 96–106)
CO2: 26 mmol/L (ref 18–29)
Calcium: 10.3 mg/dL — ABNORMAL HIGH (ref 8.6–10.2)
Creatinine, Ser: 1.18 mg/dL (ref 0.76–1.27)
GFR calc Af Amer: 69 mL/min/{1.73_m2} (ref >59)
GFR calc non Af Amer: 60 mL/min/{1.73_m2} (ref >59)
GLOBULIN, TOTAL: 2.3 g/dL (ref 1.5–4.5)
GLUCOSE: 88 mg/dL (ref 65–99)
Potassium: 4.5 mmol/L (ref 3.5–5.2)
Sodium: 142 mmol/L (ref 134–144)
Total Protein: 7 g/dL (ref 6.0–8.5)

## 2017-01-19 LAB — INSULIN, RANDOM: INSULIN: 16.1 u[IU]/mL (ref 2.6–24.9)

## 2017-01-19 LAB — HEMOGLOBIN A1C
Est. average glucose Bld gHb Est-mCnc: 123 mg/dL
HEMOGLOBIN A1C: 5.9 % — AB (ref 4.8–5.6)

## 2017-01-19 LAB — T4, FREE: FREE T4: 1.05 ng/dL (ref 0.82–1.77)

## 2017-01-19 LAB — TSH: TSH: 4.96 u[IU]/mL — ABNORMAL HIGH (ref 0.450–4.500)

## 2017-01-19 LAB — T3: T3 TOTAL: 97 ng/dL (ref 71–180)

## 2017-01-29 ENCOUNTER — Encounter: Payer: Self-pay | Admitting: Vascular Surgery

## 2017-02-01 ENCOUNTER — Telehealth: Payer: Self-pay | Admitting: Vascular Surgery

## 2017-02-01 ENCOUNTER — Ambulatory Visit (INDEPENDENT_AMBULATORY_CARE_PROVIDER_SITE_OTHER): Payer: Medicare HMO | Admitting: Family Medicine

## 2017-02-01 VITALS — BP 117/70 | HR 83 | Temp 97.6°F | Ht 69.0 in | Wt 224.0 lb

## 2017-02-01 DIAGNOSIS — R7303 Prediabetes: Secondary | ICD-10-CM

## 2017-02-01 DIAGNOSIS — E782 Mixed hyperlipidemia: Secondary | ICD-10-CM | POA: Diagnosis not present

## 2017-02-01 DIAGNOSIS — Z6833 Body mass index (BMI) 33.0-33.9, adult: Secondary | ICD-10-CM | POA: Diagnosis not present

## 2017-02-01 DIAGNOSIS — E669 Obesity, unspecified: Secondary | ICD-10-CM

## 2017-02-01 DIAGNOSIS — G4733 Obstructive sleep apnea (adult) (pediatric): Secondary | ICD-10-CM | POA: Diagnosis not present

## 2017-02-01 NOTE — Progress Notes (Signed)
Office: [phone]  /  Fax: [phone]   HPI:   Chief Complaint: OBESITY Philip [name] is here to discuss his progress with his obesity treatment plan. He is following his eating plan approximately 100 % of the time and states he is exercising 0 minutes 0 times per week. Philip [name] has done well with category 3 and weight loss. Philip [name] felt the eating plan was easy to follow but noted increased pm cravings. Hunger was mostly controlled and Philip [name] notes improved fatigue over the last 2 weeks. His weight is 224 lb (101.6 kg) today and has had a weight loss of 6 pounds over a period of 2 weeks since his last visit. He has lost 6 lbs since starting treatment with [place].  Hyperlipidemia Philip [name] has hyperlipidemia and has been trying to improve his cholesterol levels with intensive lifestyle modification including a low saturated fat diet, exercise and weight loss. LDL is controlled on Lipitor but HDL is low and triglycerides are elevated even on Tricor. He denies any chest pain, claudication or myalgias but history of coronary artery disease, status post CABG.  Pre-Diabetes Philip [name] has a diagnosis of prediabetes based on his elevated Hgb A1c, improved from last lab and now is at 5.9. Philip [name] was informed this puts him at greater risk of developing diabetes. He is not taking metformin currently and continues to work on diet and exercise to decrease risk of diabetes. Polyphagia is stable on category 2 diet. He denies nausea or hypoglycemia.   Wt Readings from Last 500 Encounters:  02/01/17 224 lb (101.6 kg)  01/18/17 230 lb (104.3 kg)  01/06/17 237 lb (107.5 kg)  12/06/16 242 lb 6.4 oz (110 kg)  11/07/16 238 lb (108 kg)  09/05/16 239 lb 3.2 oz (108.5 kg)  05/16/16 233 lb (105.7 kg)  02/24/16 229 lb (103.9 kg)  12/21/15 228 lb 2 oz (103.5 kg)  12/11/15 224 lb (101.6 kg)  06/19/15 237 lb 8 oz (107.7 kg)  04/17/15 242 lb 6 oz (109.9 kg)  04/14/15 240 lb (108.9 kg)  03/03/15 239 lb (108.4 kg)  02/18/15  241 lb (109.3 kg)  02/09/15 242 lb (109.8 kg)  12/24/14 247 lb 3.2 oz (112.1 kg)  09/15/14 241 lb 4 oz (109.4 kg)  08/06/14 234 lb 8 oz (106.4 kg)  08/04/14 234 lb (106.1 kg)  07/08/14 235 lb 7.2 oz (106.8 kg)  07/02/14 235 lb 9.6 oz (106.9 kg)  06/19/14 235 lb (106.6 kg)  06/18/14 233 lb (105.7 kg)  06/16/14 230 lb (104.3 kg)  05/29/14 230 lb (104.3 kg)  05/21/14 233 lb 6.4 oz (105.9 kg)  05/20/14 231 lb (104.8 kg)  05/16/14 231 lb (104.8 kg)  05/07/14 231 lb (104.8 kg)  04/30/14 230 lb (104.3 kg)  04/22/14 228 lb 8 oz (103.6 kg)  11/25/13 227 lb (103 kg)  05/29/13 238 lb (108 kg)  09/14/12 240 lb (108.9 kg)  06/29/11 234 lb 2 oz (106.2 kg)  02/16/11 (!) 233 lb 3.2 oz (105.8 kg)  01/01/10 (!) 229 lb 6.4 oz (104.1 kg)  02/09/09 214 lb 4 oz (97.2 kg)  11/10/08 207 lb (93.9 kg)  09/12/08 200 lb 6.1 oz (90.9 kg)  07/24/08 198 lb 6.1 oz (90 kg)  07/04/08 195 lb 12.8 oz (88.8 kg)  06/22/07 (!) 229 lb (103.9 kg)     ALLERGIES: No Known Allergies  MEDICATIONS: Current Outpatient Prescriptions on File Prior to Visit  Medication Sig Dispense Refill  . amLODipine (NORVASC) 10 MG tablet Take 1 tablet (  10 mg total) by mouth daily. 90 tablet 1  . apixaban (ELIQUIS) 5 MG TABS tablet Take 1 tablet (5 mg total) by mouth 2 (two) times daily. 180 tablet 1  . atorvastatin (LIPITOR) 40 MG tablet Take 1 tablet (40 mg total) by mouth at bedtime. 90 tablet 1  . B Complex-Biotin-FA (B-COMPLEX PO) Take 1 tablet by mouth daily.    . Cholecalciferol (VITAMIN D3) 5000 UNITS CAPS Take 5,000 Units by mouth daily.    Marland Kitchen co-enzyme Q-10 30 MG capsule Take 30 mg by mouth daily.    . fenofibrate (TRICOR) 48 MG tablet Take 1 tablet (48 mg total) by mouth daily. 90 tablet 1  . hydrocortisone 2.5 % cream Apply 1 application topically as needed.    Marland Kitchen lisinopril (PRINIVIL,ZESTRIL) 10 MG tablet Take 1 tablet (10 mg total) by mouth daily. 90 tablet 1  . magnesium oxide (MAG-OX) 400 MG tablet Take 800 mg by  mouth daily.    . metoprolol succinate (TOPROL-XL) 100 MG 24 hr tablet Take 1 tablet (100 mg total) by mouth daily. TAKE WITH OR IMMEDIATELY FOLLOWING A MEAL. 90 tablet 1  . Multiple Vitamins-Minerals (MULTIVITAMIN WITH MINERALS) tablet Take 1 tablet by mouth daily.    . Omega-3 Fatty Acids (FISH OIL) 1200 MG CAPS Take 1,200 mg by mouth daily.    . polyethylene glycol (MIRALAX / GLYCOLAX) packet Take 17 g by mouth daily.    . psyllium (METAMUCIL) 58.6 % packet Take 1 packet by mouth daily.     No current facility-administered medications on file prior to visit.     PAST MEDICAL HISTORY: Past Medical History:  Diagnosis Date  . AAA (abdominal aortic aneurysm) ([place])   . Atrial fibrillation ([place])   . CAD (coronary artery disease)    had a MI , s/p CABG  . Cataracts, bilateral    immature  . CHF (congestive heart failure) ([place])   . Dysrhythmia    paroximal a fib  . GERD (gastroesophageal reflux disease)    takes Omeprazole daily  . History of colon polyps   . History of kidney stones   . History of shingles   . Hyperlipidemia   . Hypertension    takes Amlodipine,Metoprolol,and Lisinopril daily  . Joint pain   . Joint swelling   . Myocardial infarction    several with last one being 2001(but never knew about any except 2001)  . OSA (obstructive sleep apnea)    started CPAP 12/09  . Peripheral vascular disease ([place])   . Pneumonia    as a child  . Skin cancer    several , one of them was melanoma (sees derm routinely)  . Tingling    feet occasionally  . Tinnitus     PAST SURGICAL HISTORY: Past Surgical History:  Procedure Laterality Date  . ABDOMINAL AORTAGRAM N/A 06/16/2014   Procedure: ABDOMINAL Maxcine Ham;  Surgeon: Angelia Mould, MD;  Location: [facility] CATH LAB;  Service: Cardiovascular;  Laterality: N/A;  . ABDOMINAL AORTIC ENDOVASCULAR STENT GRAFT N/A 07/08/2014   Procedure: ABDOMINAL AORTIC ENDOVASCULAR STENT GRAFT/ GORE;  Surgeon: Angelia Mould, MD;   Location: [place];  Service: Vascular;  Laterality: N/A;  . CARDIAC CATHETERIZATION  2015  . COLONOSCOPY    . CORONARY ARTERY BYPASS GRAFT  1999   x 3 vessels  . LEFT HEART CATHETERIZATION WITH CORONARY/GRAFT ANGIOGRAM N/A 05/29/2014   Procedure: LEFT HEART CATHETERIZATION WITH [name];  Surgeon: [name], MD;  Location: [facility] CATH LAB;  Service: Cardiovascular;  Laterality: N/A;  . PENILE PROSTHESIS IMPLANT  08/2005  . RHINOPLASTY  1975    SOCIAL HISTORY: Social History  Substance Use Topics  . Smoking status: Former Research scientist (life sciences)  . Smokeless tobacco: Never Used     Comment: quit smoking in 1979  . Alcohol use Yes     Comment: occasionally    FAMILY HISTORY: Family History  Problem Relation Age of Onset  . Prostate cancer Father 90  . Heart disease Father   . Skin cancer Father   . Heart attack Father   . Cancer Father   . Deep vein thrombosis Father   . Hyperlipidemia Father   . Hypertension Father   . Heart disease Mother   . Skin cancer Mother   . Heart attack Mother   . Cancer Mother   . Hyperlipidemia Mother   . Hypertension Mother   . Varicose Veins Mother   . Peripheral vascular disease Mother     amputation  . Sleep apnea Brother   . Hyperlipidemia Brother   . Hypertension Brother   . Colon cancer Other     aunt  . Skin cancer Brother   . Skin cancer Sister   . Cancer Sister   . Heart disease Sister   . Diabetes Neg Hx   . Stroke Neg Hx     ROS: Review of Systems  Constitutional: Positive for weight loss.  Cardiovascular: Negative for chest pain and claudication.  Gastrointestinal: Negative for nausea.  Musculoskeletal: Negative for myalgias.  Endo/Heme/Allergies:       Polyphagia Negative hypoglycemia    PHYSICAL EXAM: Blood pressure 117/70, pulse 83, temperature 97.6 F (36.4 C), temperature source Oral, height 5\' 9"  (1.753 m), weight 224 lb (101.6 kg), SpO2 99 %. Body mass index is 33.08 kg/m. Physical Exam    Constitutional: He is oriented to person, place, and time. He appears well-developed and well-nourished.  Cardiovascular: Normal rate.   Pulmonary/Chest: Effort normal.  Musculoskeletal: Normal range of motion.  Neurological: He is oriented to person, place, and time.  Skin: Skin is warm and dry.  Psychiatric: He has a normal mood and affect. His behavior is normal.  Vitals reviewed.   RECENT LABS AND TESTS: BMET    Component Value Date/Time   NA 142 01/18/2017 0938   K 4.5 01/18/2017 0938   CL 100 01/18/2017 0938   CO2 26 01/18/2017 0938   GLUCOSE 88 01/18/2017 0938   GLUCOSE 107 (H) 12/07/2016 0852   BUN 17 01/18/2017 0938   CREATININE 1.18 01/18/2017 0938   CREATININE 1.08 12/14/2015 1122   CALCIUM 10.3 (H) 01/18/2017 0938   GFRNONAA 60 01/18/2017 0938   GFRAA 69 01/18/2017 0938   Lab Results  Component Value Date   HGBA1C 5.9 (H) 01/18/2017   HGBA1C 6.1 12/07/2016   Lab Results  Component Value Date   INSULIN 16.1 01/18/2017   CBC    Component Value Date/Time   WBC 4.7 01/18/2017 0938   WBC 5.6 12/07/2016 0852   RBC 4.76 01/18/2017 0938   RBC 4.72 12/07/2016 0852   HGB 15.1 12/07/2016 0852   HCT 43.9 01/18/2017 0938   PLT 185.0 12/07/2016 0852   PLT 156 04/07/2010   MCV 92 01/18/2017 0938   MCH 31.5 01/18/2017 0938   MCH 31.1 12/14/2015 1122   MCHC 34.2 01/18/2017 0938   MCHC 34.3 12/07/2016 0852   RDW 13.1 01/18/2017 0938   LYMPHSABS 2.0 01/18/2017 0938   MONOABS 0.4 12/07/2016 1194  EOSABS 0.1 01/18/2017 0938   BASOSABS 0.0 01/18/2017 0938   Iron/TIBC/Ferritin/ %Sat No results found for: IRON, TIBC, FERRITIN, IRONPCTSAT Lipid Panel     Component Value Date/Time   CHOL 134 01/18/2017 0938   TRIG 224 (H) 01/18/2017 0938   TRIG 256 09/14/2009 0000   HDL 38 (L) 01/18/2017 0938   CHOLHDL 3 12/07/2016 0852   VLDL 30.8 12/07/2016 0852   LDLCALC 51 01/18/2017 0938   LDLDIRECT 68.0 04/13/2015 0823   Hepatic Function Panel     Component Value  Date/Time   PROT 7.0 01/18/2017 0938   ALBUMIN 4.7 01/18/2017 0938   AST 32 01/18/2017 0938   ALT 27 01/18/2017 0938   ALKPHOS 61 01/18/2017 0938   BILITOT 0.5 01/18/2017 0938      Component Value Date/Time   TSH 4.960 (H) 01/18/2017 0938   TSH 4.36 12/07/2016 0852   TSH 3.86 04/22/2014 1623    ASSESSMENT AND PLAN: Other hyperlipidemia  Prediabetes  Class 1 obesity without serious comorbidity with body mass index (BMI) of 33.0 to 33.9 in adult, unspecified obesity type  PLAN:  Hyperlipidemia Philip [name] was informed of the American Heart Association Guidelines emphasizing intensive lifestyle modifications as the first line treatment for hyperlipidemia. We discussed many lifestyle modifications today in depth, and Philip [name] will continue to work on decreasing saturated fats such as fatty red meat, butter and many fried foods. He will also increase vegetables and lean protein in his diet and continue to work on exercise and weight loss efforts with goal to improve triglycerides and HDL enough to discontinue Tricor. Philip [name] agrees to continue medications as directed.  Pre-Diabetes Philip [name] will continue to work on weight loss, exercise, and decreasing simple carbohydrates in his diet to help decrease the risk of diabetes. He was informed that eating too many simple carbohydrates or too many calories at one sitting increases the likelihood of GI side effects. Philip [name] agreed to follow up with [place] as directed to monitor his progress.  We spent > than 50% of the 30 minute visit on the counseling as documented in the note.   Obesity Philip [name] is currently in the action stage of change. As such, his goal is to continue with weight loss efforts He has agreed to follow the Category 3 plan Philip [name] has been instructed to work up to a goal of 150 minutes of combined cardio and strengthening exercise per week for weight loss and overall health benefits. We discussed the following Behavioral Modification  Stratagies today: no skipping meals,  increasing lean protein intake and ways to avoid night time snacking and pick better options when he does snack.  Philip [name] has agreed to follow up with our clinic in 2 weeks. He was informed of the importance of frequent follow up visits to maximize his success with intensive lifestyle modifications for his multiple health conditions.  I, Doreene Nest, am acting as scribe for [name], MD  I have reviewed the above documentation for accuracy and completeness, and I agree with the above. -[name], MD

## 2017-02-01 NOTE — Telephone Encounter (Signed)
Per patient's MyChart message, I scheduled the CTA abd/pelvis ordered by Dr.Dickson last year.  The CTA is scheduled for 02/23/17 at 2pm at the [address] location of [facility]. He knows to arrive at 1:40pm and no solid foods 4 hours prior. He is seeing [facility] on 03/01/17 at 9:30am.  The patient is aware of both appts and I will mail him a letter as well. awt

## 2017-02-08 ENCOUNTER — Ambulatory Visit: Payer: Medicare HMO | Admitting: Internal Medicine

## 2017-02-17 ENCOUNTER — Ambulatory Visit: Payer: Medicare HMO | Admitting: Neurology

## 2017-02-20 ENCOUNTER — Ambulatory Visit (INDEPENDENT_AMBULATORY_CARE_PROVIDER_SITE_OTHER): Payer: Medicare HMO | Admitting: Family Medicine

## 2017-02-20 VITALS — BP 130/73 | HR 62 | Temp 97.9°F | Ht 69.0 in | Wt 221.0 lb

## 2017-02-20 DIAGNOSIS — E669 Obesity, unspecified: Secondary | ICD-10-CM

## 2017-02-20 DIAGNOSIS — Z6832 Body mass index (BMI) 32.0-32.9, adult: Secondary | ICD-10-CM

## 2017-02-20 DIAGNOSIS — K5900 Constipation, unspecified: Secondary | ICD-10-CM

## 2017-02-20 NOTE — Progress Notes (Signed)
Office: [phone]  /  Fax: [phone]   HPI:   Chief Complaint: OBESITY Hagop is here to discuss his progress with his obesity treatment plan. He is following his eating plan approximately 100 % of the time and states he is exercising 0 minutes 0 times per week. Farley continues to do well with weight loss. He is still struggling with nighttime cravings. He states he is eating all his food and not skipping meals.  His weight is 221 lb (100.2 kg) today and has had a weight loss of 3 pounds over a period of 3 weeks since his last visit. He has lost 9 lbs since starting treatment with [place].  Constipation Barnell notes constipation for the last few weeks, worse since attempting weight loss. He states BM are less frequent and are harder but not more painful. He denies hematochezia or melena. He denies drinking less H20 recently. He is on daily Miralax.  Wt Readings from Last 500 Encounters:  02/20/17 221 lb (100.2 kg)  02/01/17 224 lb (101.6 kg)  01/18/17 230 lb (104.3 kg)  01/06/17 237 lb (107.5 kg)  12/06/16 242 lb 6.4 oz (110 kg)  11/07/16 238 lb (108 kg)  09/05/16 239 lb 3.2 oz (108.5 kg)  05/16/16 233 lb (105.7 kg)  02/24/16 229 lb (103.9 kg)  12/21/15 228 lb 2 oz (103.5 kg)  12/11/15 224 lb (101.6 kg)  06/19/15 237 lb 8 oz (107.7 kg)  04/17/15 242 lb 6 oz (109.9 kg)  04/14/15 240 lb (108.9 kg)  03/03/15 239 lb (108.4 kg)  02/18/15 241 lb (109.3 kg)  02/09/15 242 lb (109.8 kg)  12/24/14 247 lb 3.2 oz (112.1 kg)  09/15/14 241 lb 4 oz (109.4 kg)  08/06/14 234 lb 8 oz (106.4 kg)  08/04/14 234 lb (106.1 kg)  07/08/14 235 lb 7.2 oz (106.8 kg)  07/02/14 235 lb 9.6 oz (106.9 kg)  06/19/14 235 lb (106.6 kg)  06/18/14 233 lb (105.7 kg)  06/16/14 230 lb (104.3 kg)  05/29/14 230 lb (104.3 kg)  05/21/14 233 lb 6.4 oz (105.9 kg)  05/20/14 231 lb (104.8 kg)  05/16/14 231 lb (104.8 kg)  05/07/14 231 lb (104.8 kg)  04/30/14 230 lb (104.3 kg)  04/22/14 228 lb 8 oz (103.6 kg)    11/25/13 227 lb (103 kg)  05/29/13 238 lb (108 kg)  09/14/12 240 lb (108.9 kg)  06/29/11 234 lb 2 oz (106.2 kg)  02/16/11 (!) 233 lb 3.2 oz (105.8 kg)  01/01/10 (!) 229 lb 6.4 oz (104.1 kg)  02/09/09 214 lb 4 oz (97.2 kg)  11/10/08 207 lb (93.9 kg)  09/12/08 200 lb 6.1 oz (90.9 kg)  07/24/08 198 lb 6.1 oz (90 kg)  07/04/08 195 lb 12.8 oz (88.8 kg)  06/22/07 (!) 229 lb (103.9 kg)     ALLERGIES: No Known Allergies  MEDICATIONS: Current Outpatient Prescriptions on File Prior to Visit  Medication Sig Dispense Refill  . amLODipine (NORVASC) 10 MG tablet Take 1 tablet (10 mg total) by mouth daily. 90 tablet 1  . apixaban (ELIQUIS) 5 MG TABS tablet Take 1 tablet (5 mg total) by mouth 2 (two) times daily. 180 tablet 1  . atorvastatin (LIPITOR) 40 MG tablet Take 1 tablet (40 mg total) by mouth at bedtime. 90 tablet 1  . B Complex-Biotin-FA (B-COMPLEX PO) Take 1 tablet by mouth daily.    . Cholecalciferol (VITAMIN D3) 5000 UNITS CAPS Take 5,000 Units by mouth daily.    Marland Kitchen co-enzyme Q-10 30  MG capsule Take 30 mg by mouth daily.    . fenofibrate (TRICOR) 48 MG tablet Take 1 tablet (48 mg total) by mouth daily. 90 tablet 1  . hydrocortisone 2.5 % cream Apply 1 application topically as needed.    Marland Kitchen lisinopril (PRINIVIL,ZESTRIL) 10 MG tablet Take 1 tablet (10 mg total) by mouth daily. 90 tablet 1  . magnesium oxide (MAG-OX) 400 MG tablet Take 800 mg by mouth daily.    . metoprolol succinate (TOPROL-XL) 100 MG 24 hr tablet Take 1 tablet (100 mg total) by mouth daily. TAKE WITH OR IMMEDIATELY FOLLOWING A MEAL. 90 tablet 1  . Multiple Vitamins-Minerals (MULTIVITAMIN WITH MINERALS) tablet Take 1 tablet by mouth daily.    . Omega-3 Fatty Acids (FISH OIL) 1200 MG CAPS Take 1,200 mg by mouth daily.    . polyethylene glycol (MIRALAX / GLYCOLAX) packet Take 17 g by mouth daily.    . psyllium (METAMUCIL) 58.6 % packet Take 1 packet by mouth daily.     No current facility-administered medications on file  prior to visit.     PAST MEDICAL HISTORY: Past Medical History:  Diagnosis Date  . AAA (abdominal aortic aneurysm) ([place])   . Atrial fibrillation ([place])   . CAD (coronary artery disease)    had a MI , s/p CABG  . Cataracts, bilateral    immature  . CHF (congestive heart failure) ([place])   . Dysrhythmia    paroximal a fib  . GERD (gastroesophageal reflux disease)    takes Omeprazole daily  . History of colon polyps   . History of kidney stones   . History of shingles   . Hyperlipidemia   . Hypertension    takes Amlodipine,Metoprolol,and Lisinopril daily  . Joint pain   . Joint swelling   . Myocardial infarction    several with last one being 2001(but never knew about any except 2001)  . OSA (obstructive sleep apnea)    started CPAP 12/09  . Peripheral vascular disease ([place])   . Pneumonia    as a child  . Skin cancer    several , one of them was melanoma (sees derm routinely)  . Tingling    feet occasionally  . Tinnitus     PAST SURGICAL HISTORY: Past Surgical History:  Procedure Laterality Date  . ABDOMINAL AORTAGRAM N/A 06/16/2014   Procedure: ABDOMINAL Maxcine Ham;  Surgeon: Angelia Mould, MD;  Location[facility] CATH LAB;  Service: Cardiovascular;  Laterality: N/A;  . ABDOMINAL AORTIC ENDOVASCULAR STENT GRAFT N/A 07/08/2014   Procedure: ABDOMINAL AORTIC ENDOVASCULAR STENT GRAFT/ GORE;  Surgeon: Angelia Mould, MD;  Location: [place];  Service: Vascular;  Laterality: N/A;  . CARDIAC CATHETERIZATION  2015  . COLONOSCOPY    . CORONARY ARTERY BYPASS GRAFT  1999   x 3 vessels  . LEFT HEART CATHETERIZATION WITH CORONARY/GRAFT ANGIOGRAM N/A 05/29/2014   Procedure: LEFT HEART CATHETERIZATION WITH [name];  Surgeon: [name], MD;  Location: [facility] CATH LAB;  Service: Cardiovascular;  Laterality: N/A;  . PENILE PROSTHESIS IMPLANT  08/2005  . RHINOPLASTY  1975    SOCIAL HISTORY: Social History  Substance Use Topics  . Smoking status: Former Research scientist (life sciences)    . Smokeless tobacco: Never Used     Comment: quit smoking in 1979  . Alcohol use Yes     Comment: occasionally    FAMILY HISTORY: Family History  Problem Relation Age of Onset  . Prostate cancer Father 83  . Heart disease Father   .  Skin cancer Father   . Heart attack Father   . Cancer Father   . Deep vein thrombosis Father   . Hyperlipidemia Father   . Hypertension Father   . Heart disease Mother   . Skin cancer Mother   . Heart attack Mother   . Cancer Mother   . Hyperlipidemia Mother   . Hypertension Mother   . Varicose Veins Mother   . Peripheral vascular disease Mother     amputation  . Sleep apnea Brother   . Hyperlipidemia Brother   . Hypertension Brother   . Colon cancer Other     aunt  . Skin cancer Brother   . Skin cancer Sister   . Cancer Sister   . Heart disease Sister   . Diabetes Neg Hx   . Stroke Neg Hx     ROS: Review of Systems  Constitutional: Positive for weight loss.  Gastrointestinal: Positive for constipation. Negative for melena.  Endo/Heme/Allergies:       Polyphagia    PHYSICAL EXAM: Blood pressure 130/73, pulse 62, temperature 97.9 F (36.6 C), temperature source Oral, height 5\' 9"  (1.753 m), weight 221 lb (100.2 kg), SpO2 99 %. Body mass index is 32.64 kg/m. Physical Exam  Constitutional: He is oriented to person, place, and time. He appears well-developed and well-nourished.  Cardiovascular: Normal rate.   Pulmonary/Chest: Effort normal.  Musculoskeletal: Normal range of motion.  Neurological: He is oriented to person, place, and time.  Skin: Skin is warm and dry.  Psychiatric: He has a normal mood and affect. His behavior is normal.  Vitals reviewed.   RECENT LABS AND TESTS: BMET    Component Value Date/Time   NA 142 01/18/2017 0938   K 4.5 01/18/2017 0938   CL 100 01/18/2017 0938   CO2 26 01/18/2017 0938   GLUCOSE 88 01/18/2017 0938   GLUCOSE 107 (H) 12/07/2016 0852   BUN 17 01/18/2017 0938   CREATININE 1.18  01/18/2017 0938   CREATININE 1.08 12/14/2015 1122   CALCIUM 10.3 (H) 01/18/2017 0938   GFRNONAA 60 01/18/2017 0938   GFRAA 69 01/18/2017 0938   Lab Results  Component Value Date   HGBA1C 5.9 (H) 01/18/2017   HGBA1C 6.1 12/07/2016   Lab Results  Component Value Date   INSULIN 16.1 01/18/2017   CBC    Component Value Date/Time   WBC 4.7 01/18/2017 0938   WBC 5.6 12/07/2016 0852   RBC 4.76 01/18/2017 0938   RBC 4.72 12/07/2016 0852   HGB 15.1 12/07/2016 0852   HCT 43.9 01/18/2017 0938   PLT 185.0 12/07/2016 0852   PLT 156 04/07/2010   MCV 92 01/18/2017 0938   MCH 31.5 01/18/2017 0938   MCH 31.1 12/14/2015 1122   MCHC 34.2 01/18/2017 0938   MCHC 34.3 12/07/2016 0852   RDW 13.1 01/18/2017 0938   LYMPHSABS 2.0 01/18/2017 0938   MONOABS 0.4 12/07/2016 0852   EOSABS 0.1 01/18/2017 0938   BASOSABS 0.0 01/18/2017 0938   Iron/TIBC/Ferritin/ %Sat No results found for: IRON, TIBC, FERRITIN, IRONPCTSAT Lipid Panel     Component Value Date/Time   CHOL 134 01/18/2017 0938   TRIG 224 (H) 01/18/2017 0938   TRIG 256 09/14/2009 0000   HDL 38 (L) 01/18/2017 0938   CHOLHDL 3 12/07/2016 0852   VLDL 30.8 12/07/2016 0852   LDLCALC 51 01/18/2017 0938   LDLDIRECT 68.0 04/13/2015 0823   Hepatic Function Panel     Component Value Date/Time   PROT 7.0 01/18/2017 7616  ALBUMIN 4.7 01/18/2017 0938   AST 32 01/18/2017 0938   ALT 27 01/18/2017 0938   ALKPHOS 61 01/18/2017 0938   BILITOT 0.5 01/18/2017 0938      Component Value Date/Time   TSH 4.960 (H) 01/18/2017 0938   TSH 4.36 12/07/2016 0852   TSH 3.86 04/22/2014 1623    ASSESSMENT AND PLAN: Constipation, unspecified constipation type  Class 1 obesity without serious comorbidity with body mass index (BMI) of 32.0 to 32.9 in adult, unspecified obesity type  PLAN:  Constipation Nathanuel was informed decrease bowel movement frequency is normal while losing weight, but stools should not be hard or painful. We discussed normal  bowel movement frequency and to advise me if anything changes. He was advised to continue Miralax daily and work on increasing his H20 intake and work on increasing his fiber intake. High fiber foods were discussed today.  We spent > than 50% of the 15 minute visit on the counseling as documented in the note.  Obesity Lelan is currently in the action stage of change. As such, his goal is to continue with weight loss efforts He has agreed to follow the Category 3 plan +100 calories Jahi has been instructed to work up to a goal of 150 minutes of combined cardio and strengthening exercise per week for weight loss and overall health benefits. We discussed the following Behavioral Modification Stratagies today: increasing lean protein intake, decrease eating out and ways to avoid boredom eating  Aneesh has agreed to follow up with our clinic in 3 weeks. He was informed of the importance of frequent follow up visits to maximize his success with intensive lifestyle modifications for his multiple health conditions.  I, Doreene Nest, am acting as scribe for [name], MD  I have reviewed the above documentation for accuracy and completeness, and I agree with the above. -[name], MD

## 2017-02-22 ENCOUNTER — Ambulatory Visit
Admission: RE | Admit: 2017-02-22 | Discharge: 2017-02-22 | Disposition: A | Discharge status: Home / Self Care | Payer: Medicare HMO | Source: Ambulatory Visit | Attending: Vascular Surgery | Admitting: Vascular Surgery

## 2017-02-22 ENCOUNTER — Encounter: Payer: Self-pay | Admitting: Vascular Surgery

## 2017-02-22 ENCOUNTER — Encounter: Payer: Self-pay | Admitting: Internal Medicine

## 2017-02-22 ENCOUNTER — Ambulatory Visit (INDEPENDENT_AMBULATORY_CARE_PROVIDER_SITE_OTHER): Payer: Medicare HMO | Admitting: Internal Medicine

## 2017-02-22 VITALS — BP 124/78 | HR 76 | Temp 97.5°F | Resp 14 | Ht 69.0 in | Wt 227.1 lb

## 2017-02-22 DIAGNOSIS — Z48812 Encounter for surgical aftercare following surgery on the circulatory system: Secondary | ICD-10-CM

## 2017-02-22 DIAGNOSIS — R7303 Prediabetes: Secondary | ICD-10-CM

## 2017-02-22 DIAGNOSIS — I714 Abdominal aortic aneurysm, without rupture, unspecified: Secondary | ICD-10-CM

## 2017-02-22 DIAGNOSIS — I25810 Atherosclerosis of coronary artery bypass graft(s) without angina pectoris: Secondary | ICD-10-CM

## 2017-02-22 DIAGNOSIS — N529 Male erectile dysfunction, unspecified: Secondary | ICD-10-CM

## 2017-02-22 DIAGNOSIS — I10 Essential (primary) hypertension: Secondary | ICD-10-CM

## 2017-02-22 MED ORDER — IOPAMIDOL (ISOVUE-370) INJECTION 76%
75.0000 mL | Freq: Once | INTRAVENOUS | Status: AC | PRN
  Administered 2017-02-22: 75 mL via INTRAVENOUS

## 2017-02-22 NOTE — Patient Instructions (Addendum)
   GO TO THE FRONT DESK Schedule your next appointment for a  Follow up 6-8  months from today   Check the  blood pressure  Weekly   Be sure your blood pressure is between 110/65 and  140/85.  if it is consistently higher or lower, let me know

## 2017-02-22 NOTE — Progress Notes (Signed)
Subjective:    Patient ID: Philip [name], male    DOB: 01-13-1941, 76 y.o.   MRN: [id number]  DOS:  02/22/2017 Type of visit - description : rov Interval history: Cardiovascular: Cardiology note reviewed Obesity, patient seen Dr. Leafy Ro, doing better with diet, has lost a few pounds per his scale, feeling better. HTN: Good med compliance, edema not an issue anymore. Erectile dysfunction, urology evaluation pending. He uses several terms interchangeably to decreased his sx: libido, ED and "lack of feeling" . See assessment and plan  Wt Readings from Last 3 Encounters:  02/22/17 227 lb 2 oz (103 kg)  02/20/17 221 lb (100.2 kg)  02/01/17 224 lb (101.6 kg)     Review of Systems Neuropathy symptoms still there, improved?Marland Kitchen No chest pain or difficulty breathing.  Past Medical History:  Diagnosis Date  . AAA (abdominal aortic aneurysm) ([place])   . Atrial fibrillation ([place])   . CAD (coronary artery disease)    had a MI , s/p CABG  . Cataracts, bilateral    immature  . CHF (congestive heart failure) ([place])   . Dysrhythmia    paroximal a fib  . GERD (gastroesophageal reflux disease)    takes Omeprazole daily  . History of colon polyps   . History of kidney stones   . History of shingles   . Hyperlipidemia   . Hypertension    takes Amlodipine,Metoprolol,and Lisinopril daily  . Joint pain   . Joint swelling   . Myocardial infarction [facility])    several with last one being 2001(but never knew about any except 2001)  . OSA (obstructive sleep apnea)    started CPAP 12/09  . Peripheral vascular disease ([place])   . Pneumonia    as a child  . Skin cancer    several , one of them was melanoma (sees derm routinely)  . Tingling    feet occasionally  . Tinnitus     Past Surgical History:  Procedure Laterality Date  . ABDOMINAL AORTAGRAM N/A 06/16/2014   Procedure: ABDOMINAL Maxcine Ham;  Surgeon: Angelia Mould, MD;  Location: [facility] CATH LAB;  Service: Cardiovascular;  Laterality:  N/A;  . ABDOMINAL AORTIC ENDOVASCULAR STENT GRAFT N/A 07/08/2014   Procedure: ABDOMINAL AORTIC ENDOVASCULAR STENT GRAFT/ GORE;  Surgeon: Angelia Mould, MD;  Location: [place];  Service: Vascular;  Laterality: N/A;  . CARDIAC CATHETERIZATION  2015  . COLONOSCOPY    . CORONARY ARTERY BYPASS GRAFT  1999   x 3 vessels  . LEFT HEART CATHETERIZATION WITH CORONARY/GRAFT ANGIOGRAM N/A 05/29/2014   Procedure: LEFT HEART CATHETERIZATION WITH [name];  Surgeon: [name], MD;  Location: [facility] CATH LAB;  Service: Cardiovascular;  Laterality: N/A;  . PENILE PROSTHESIS IMPLANT  08/2005  . RHINOPLASTY  1975    Social History   Social History  . Marital status: Married    Spouse name: N/A  . Number of children: 3  . Years of education: N/A   Occupational History  . retired, Actor    Social History Main Topics  . Smoking status: Former Research scientist (life sciences)  . Smokeless tobacco: Never Used     Comment: quit smoking in 1979  . Alcohol use Yes     Comment: occasionally  . Drug use: No  . Sexual activity: Not Currently   Other Topics Concern  . Not on file   Social History Narrative   Lives w/ wife         Allergies as of 02/22/2017  No Known Allergies     Medication List       Accurate as of 02/22/17  9:15 PM. Always use your most recent med list.          amLODipine 10 MG tablet Commonly known as:  NORVASC Take 1 tablet (10 mg total) by mouth daily.   apixaban 5 MG Tabs tablet Commonly known as:  ELIQUIS Take 1 tablet (5 mg total) by mouth 2 (two) times daily.   atorvastatin 40 MG tablet Commonly known as:  LIPITOR Take 1 tablet (40 mg total) by mouth at bedtime.   B-COMPLEX PO Take 1 tablet by mouth daily.   co-enzyme Q-10 30 MG capsule Take 30 mg by mouth daily.   fenofibrate 48 MG tablet Commonly known as:  TRICOR Take 1 tablet (48 mg total) by mouth daily.   Fish Oil 1200 MG Caps Take 1,200 mg by mouth daily.   hydrocortisone 2.5 %  cream Apply 1 application topically as needed.   lisinopril 10 MG tablet Commonly known as:  PRINIVIL,ZESTRIL Take 1 tablet (10 mg total) by mouth daily.   magnesium oxide 400 MG tablet Commonly known as:  MAG-OX Take 800 mg by mouth daily.   metoprolol succinate 100 MG 24 hr tablet Commonly known as:  TOPROL-XL Take 1 tablet (100 mg total) by mouth daily. TAKE WITH OR IMMEDIATELY FOLLOWING A MEAL.   multivitamin with minerals tablet Take 1 tablet by mouth daily.   polyethylene glycol packet Commonly known as:  MIRALAX / GLYCOLAX Take 17 g by mouth daily.   psyllium 58.6 % packet Commonly known as:  METAMUCIL Take 1 packet by mouth daily.   Vitamin D3 5000 units Caps Take 5,000 Units by mouth daily.          Objective:   Physical Exam BP 124/78 (BP Location: Left Arm, Patient Position: Sitting, Cuff Size: Normal)   Pulse 76   Temp 97.5 F (36.4 C) (Oral)   Resp 14   Ht 5\' 9"  (1.753 m)   Wt 227 lb 2 oz (103 kg)   SpO2 97%   BMI 33.54 kg/m  General:   Well developed, well nourished . NAD.  HEENT:  Normocephalic . Face symmetric, atraumatic Lungs:  CTA B Normal respiratory effort, no intercostal retractions, no accessory muscle use. Heart: RRR,  no murmur.  No pretibial edema bilaterally  Skin: Not pale. Not jaundice Neurologic:  alert & oriented X3.  Speech normal, gait appropriate for age and unassisted Psych--  Cognition and judgment appear intact.  Cooperative with normal attention span and concentration.  Behavior appropriate. No anxious or depressed appearing.      Assessment & Plan:    Assessment Hyperglycemia: A1c 6.1  12/2016 HTN Hyperlipidemia Obesity- 1st visit [name] 01-18-17 CV: ---CAD >>>  MI, CABG   ---CHF ---Peripheral vascular disease ---AAA - see procedures , [name], next visit due 01-2017 --- A. Fib, paroxysmal  GERD  Chronic constipation OSA- Cpap (Dr [name]) ED- penile implant H/o skin cancer, Dr. [name] H/o  Kidney stones 2016  PLAN:  Hyperglycemia: A1c was 6.1, patient aware. HTN: Good compliance of medication, edema less of   Problem since he started to lose weight . Continue metoprolol, lisinopril, amlodipine CAD: Saw cardiology 12-2016, he was is stable, EKG shows sinus rhythm,  Myoview satisfactory 12/2016 Neuropathy?: R15, folic acid, TSH normal. Neurology eval pending. ED: The patient reported ED at the last visit, urology eval pending. Today I clarify the terms he uses  interchangeably. The patient reports that his main concern is "lack of feeling" on further questioning he refers to a lack of sensitivity  at the genital area during intercourse. He has a penile pump and that works well (able to penetrate). Libido is normal. Further eval per urology. RTC in few months 6-8 months

## 2017-02-22 NOTE — Assessment & Plan Note (Signed)
Hyperglycemia: A1c was 6.1, patient aware. HTN: Good compliance of medication, edema less of   Problem since he started to lose weight . Continue metoprolol, lisinopril, amlodipine CAD: Saw cardiology 12-2016, he was is stable, EKG shows sinus rhythm,  Myoview satisfactory 12/2016 Neuropathy?: W97, folic acid, TSH normal. Neurology eval pending. ED: The patient reported ED at the last visit, urology eval pending. Today I clarify the terms he uses interchangeably. The patient reports that his main concern is "lack of feeling" on further questioning he refers to a lack of sensitivity  at the genital area during intercourse. He has a penile pump and that works well (able to penetrate). Libido is normal. Further eval per urology. RTC in few months 6-8 months

## 2017-02-22 NOTE — Progress Notes (Signed)
Pre visit review using our clinic review tool, if applicable. No additional management support is needed unless otherwise documented below in the visit note. 

## 2017-02-23 ENCOUNTER — Other Ambulatory Visit: Payer: Medicare HMO

## 2017-03-01 ENCOUNTER — Encounter: Payer: Self-pay | Admitting: Vascular Surgery

## 2017-03-01 ENCOUNTER — Ambulatory Visit (INDEPENDENT_AMBULATORY_CARE_PROVIDER_SITE_OTHER): Payer: Medicare HMO | Admitting: Vascular Surgery

## 2017-03-01 VITALS — BP 137/70 | HR 50 | Temp 97.7°F | Resp 16 | Ht 69.0 in | Wt 225.0 lb

## 2017-03-01 DIAGNOSIS — I714 Abdominal aortic aneurysm, without rupture, unspecified: Secondary | ICD-10-CM

## 2017-03-01 NOTE — Progress Notes (Signed)
Patient name: Philip [name] MRN: [id number] DOB: 26-Oct-1941 Sex: male  REASON FOR VISIT:    Follow up after endovascular aneurysm repair.  HPI:   Philip [name] is a 76 y.o. 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 I last saw the patient on 02/24/2016. Duplex scan at that time showed that the maximum diameter of the aneurysm was 6.7 cm and thus was stable in size. He comes in for a 1 year follow up visit. He has small hypogastric artery aneurysms and therefore I arranged for a CT scan this visit with the plan to alternate between ultrasound and CT scans.  Since I saw him last, he denies any abdominal pain or back pain. He is been trying to lose some weight and is working with [employer] program. He has lost some weight and overall is doing very well.  He continues to be on Eliquis as he had some arrhythmias in the past but is not sure if he still needs to be on this. I told them that he would have to discuss this with his cardiologist Dr. Saunders Revel, as they were the ones who put him on this prior to his endovascular aneurysm repair. He is not taking aspirin because he is on the Eliquis. He is on a statin.  Past Medical History:  Diagnosis Date  . AAA (abdominal aortic aneurysm) ([place])   . Atrial fibrillation ([place])   . CAD (coronary artery disease)    had a MI , s/p CABG  . Cataracts, bilateral    immature  . CHF (congestive heart failure) ([place])   . Dysrhythmia    paroximal a fib  . GERD (gastroesophageal reflux disease)    takes Omeprazole daily  . History of colon polyps   . History of kidney stones   . History of shingles   . Hyperlipidemia   . Hypertension    takes Amlodipine,Metoprolol,and Lisinopril daily  . Joint pain   . Joint swelling   . Myocardial infarction [facility])    several with last one being 2001(but never knew about any except 2001)  . OSA (obstructive sleep apnea)    started CPAP 12/09  .  Peripheral vascular disease ([place])   . Pneumonia    as a child  . Skin cancer    several , one of them was melanoma (sees derm routinely)  . Tingling    feet occasionally  . Tinnitus     Family History  Problem Relation Age of Onset  . Prostate cancer Father 43  . Heart disease Father   . Skin cancer Father   . Heart attack Father   . Cancer Father   . Deep vein thrombosis Father   . Hyperlipidemia Father   . Hypertension Father   . Heart disease Mother   . Skin cancer Mother   . Heart attack Mother   . Cancer Mother   . Hyperlipidemia Mother   . Hypertension Mother   . Varicose Veins Mother   . Peripheral vascular disease Mother     amputation  . Sleep apnea Brother   . Hyperlipidemia Brother   . Hypertension Brother   . Colon cancer Other     aunt  . Skin cancer Brother   . Skin cancer Sister   . Cancer Sister   . Heart disease Sister   . Diabetes Neg Hx   . Stroke Neg Hx     SOCIAL  HISTORY: Social History  Substance Use Topics  . Smoking status: Former Research scientist (life sciences)  . Smokeless tobacco: Never Used     Comment: quit smoking in 1979  . Alcohol use Yes     Comment: occasionally    No Known Allergies  Current Outpatient Prescriptions  Medication Sig Dispense Refill  . amLODipine (NORVASC) 10 MG tablet Take 1 tablet (10 mg total) by mouth daily. 90 tablet 1  . apixaban (ELIQUIS) 5 MG TABS tablet Take 1 tablet (5 mg total) by mouth 2 (two) times daily. 180 tablet 1  . atorvastatin (LIPITOR) 40 MG tablet Take 1 tablet (40 mg total) by mouth at bedtime. 90 tablet 1  . B Complex-Biotin-FA (B-COMPLEX PO) Take 1 tablet by mouth daily.    . Cholecalciferol (VITAMIN D3) 5000 UNITS CAPS Take 5,000 Units by mouth daily.    Marland Kitchen co-enzyme Q-10 30 MG capsule Take 30 mg by mouth daily.    . fenofibrate (TRICOR) 48 MG tablet Take 1 tablet (48 mg total) by mouth daily. 90 tablet 1  . hydrocortisone 2.5 % cream Apply 1 application topically as needed.    Marland Kitchen lisinopril  (PRINIVIL,ZESTRIL) 10 MG tablet Take 1 tablet (10 mg total) by mouth daily. 90 tablet 1  . magnesium oxide (MAG-OX) 400 MG tablet Take 800 mg by mouth daily.    . metoprolol succinate (TOPROL-XL) 100 MG 24 hr tablet Take 1 tablet (100 mg total) by mouth daily. TAKE WITH OR IMMEDIATELY FOLLOWING A MEAL. 90 tablet 1  . Multiple Vitamins-Minerals (MULTIVITAMIN WITH MINERALS) tablet Take 1 tablet by mouth daily.    . Omega-3 Fatty Acids (FISH OIL) 1200 MG CAPS Take 1,200 mg by mouth daily.    . polyethylene glycol (MIRALAX / GLYCOLAX) packet Take 17 g by mouth daily.    . psyllium (METAMUCIL) 58.6 % packet Take 1 packet by mouth daily.     No current facility-administered medications for this visit.     REVIEW OF SYSTEMS:  [X]  denotes positive finding, [ ]  denotes negative finding Cardiac  Comments:  Chest pain or chest pressure:    Shortness of breath upon exertion:    Short of breath when lying flat:    Irregular heart rhythm:        Vascular    Pain in calf, thigh, or hip brought on by ambulation: X   Pain in feet at night that wakes you up from your sleep:  X   Blood clot in your veins:    Leg swelling:         Pulmonary    Oxygen at home:    Productive cough:     Wheezing:         Neurologic    Sudden weakness in arms or legs:     Sudden numbness in arms or legs:     Sudden onset of difficulty speaking or slurred speech:    Temporary loss of vision in one eye:     Problems with dizziness:         Gastrointestinal    Blood in stool:     Vomited blood:         Genitourinary    Burning when urinating:     Blood in urine:        Psychiatric    Major depression:         Hematologic    Bleeding problems:    Problems with blood clotting too easily:  Skin    Rashes or ulcers:        Constitutional    Fever or chills:     PHYSICAL EXAM:   Vitals:   03/01/17 0919  BP: 137/70  Pulse: (!) 50  Resp: 16  Temp: 97.7 F (36.5 C)  TempSrc: Oral  SpO2: 97%    Weight: 225 lb (102.1 kg)  Height: 5\' 9"  (1.753 m)    GENERAL: The patient is a well-nourished male, in no acute distress. The vital signs are documented above. CARDIAC: There is a regular rate and rhythm.  VASCULAR: I do not detect carotid bruits. He has palpable femoral, popliteal, and posterior tibial pulses bilaterally. He has no significant lower extremity swelling. PULMONARY: There is good air exchange bilaterally without wheezing or rales. ABDOMEN: Soft and non-tender with normal pitched bowel sounds.  MUSCULOSKELETAL: There are no major deformities or cyanosis. NEUROLOGIC: No focal weakness or paresthesias are detected. SKIN: There are no ulcers or rashes noted. PSYCHIATRIC: The patient has a normal affect.  DATA:    CT ANGIOGRAM ABDOMEN AND PELVIS: I have reviewed his CT scan which shows that the graft is in good position with no evidence of endoleak. The aneurysm is stable in size (6.7 cm). The small aneurysm of the left hypogastric artery is unchanged in size (2.2 cm). 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 He has bilateral adrenal adenomas which are also stable in size.  MEDICAL ISSUES:   STATUS POST ENDOVASCULAR ANEURYSM REPAIR: His follow up CT scan shows no problems with his endograft. The aneurysm is stable in size at 6.7 cm. The right hypogastric artery measures 1.35 cm in maximum diameter. The left hypogastric artery measures 2.2 cm in maximum diameter. These are both stable in size. I will continue with the plan to alternate CT scan with ultrasound. Therefore I will see him back in 1 year with a duplex. Fortunately, he is not a smoker. He will discuss the need for Eliquis with his cardiologist Dr. Saunders Revel. From my standpoint he does not need to be on this and I would prefer to have him on aspirin  Deitra Mayo [facility] [employer] [phone]

## 2017-03-02 NOTE — Addendum Note (Signed)
Addended by: Lianne Cure A on: 03/02/2017 09:41 AM   Modules accepted: Orders

## 2017-03-03 DIAGNOSIS — G4733 Obstructive sleep apnea (adult) (pediatric): Secondary | ICD-10-CM | POA: Diagnosis not present

## 2017-03-06 DIAGNOSIS — F5232 Male orgasmic disorder: Secondary | ICD-10-CM | POA: Diagnosis not present

## 2017-03-13 ENCOUNTER — Other Ambulatory Visit: Payer: Medicare HMO

## 2017-03-13 ENCOUNTER — Ambulatory Visit (INDEPENDENT_AMBULATORY_CARE_PROVIDER_SITE_OTHER): Payer: Medicare HMO | Admitting: Neurology

## 2017-03-13 ENCOUNTER — Ambulatory Visit (INDEPENDENT_AMBULATORY_CARE_PROVIDER_SITE_OTHER): Payer: Medicare HMO | Admitting: Family Medicine

## 2017-03-13 ENCOUNTER — Encounter: Payer: Self-pay | Admitting: Neurology

## 2017-03-13 VITALS — BP 118/57 | HR 60 | Temp 97.8°F | Ht 69.0 in | Wt 217.0 lb

## 2017-03-13 VITALS — BP 134/74 | HR 36 | Ht 69.0 in | Wt 222.0 lb

## 2017-03-13 DIAGNOSIS — R001 Bradycardia, unspecified: Secondary | ICD-10-CM

## 2017-03-13 DIAGNOSIS — G629 Polyneuropathy, unspecified: Secondary | ICD-10-CM | POA: Diagnosis not present

## 2017-03-13 DIAGNOSIS — Z6832 Body mass index (BMI) 32.0-32.9, adult: Secondary | ICD-10-CM

## 2017-03-13 DIAGNOSIS — M79672 Pain in left foot: Secondary | ICD-10-CM

## 2017-03-13 DIAGNOSIS — G609 Hereditary and idiopathic neuropathy, unspecified: Secondary | ICD-10-CM | POA: Insufficient documentation

## 2017-03-13 DIAGNOSIS — E669 Obesity, unspecified: Secondary | ICD-10-CM | POA: Diagnosis not present

## 2017-03-13 NOTE — Patient Instructions (Addendum)
1.  Check labs 2.  Referral to podiatry 3.  We will call you with the results 4.  If your burning and tingling gets bothersome, you can try over the counter lidocaine cream (Aspercream or Salonpas)  Return to clinic as needed

## 2017-03-13 NOTE — Progress Notes (Signed)
lab

## 2017-03-13 NOTE — Progress Notes (Signed)
Office: [phone]  /  Fax: [phone]   HPI:   Chief Complaint: OBESITY Philip [name] is here to discuss his progress with his obesity treatment plan. He is on the Category 3 plan +100 calories and is following his eating plan approximately 90 % of the time. He states he is exercising 0 minutes 0 times per week. Philip [name] continues to do well with weight loss. He has had some extra temptations from family and social eating situations. He is working on increasing protein and not skipping meals. He is ready to start exercising. Philip [name] has peripheral neuropathy but this limits his exercise. His weight is 217 lb (98.4 kg) today and has had a weight loss of 5 pounds over a period of 2 weeks since his last visit. He has lost 13 lbs since starting treatment with [place].  Bilateral Peripheral Neuropathy Philip [name] has a diagnosis of bilateral peripheral neuropathy and notes, this limits his activity level. He trimmed his toenails last night and cut too close and bled but didn't feel it. He has an appointment with a neurosurgeon for future evaluation.   ALLERGIES: No Known Allergies  MEDICATIONS: Current Outpatient Prescriptions on File Prior to Visit  Medication Sig Dispense Refill  . amLODipine (NORVASC) 10 MG tablet Take 1 tablet (10 mg total) by mouth daily. 90 tablet 1  . apixaban (ELIQUIS) 5 MG TABS tablet Take 1 tablet (5 mg total) by mouth 2 (two) times daily. 180 tablet 1  . atorvastatin (LIPITOR) 40 MG tablet Take 1 tablet (40 mg total) by mouth at bedtime. 90 tablet 1  . B Complex-Biotin-FA (B-COMPLEX PO) Take 1 tablet by mouth daily.    . Cholecalciferol (VITAMIN D3) 5000 UNITS CAPS Take 5,000 Units by mouth daily.    Marland Kitchen co-enzyme Q-10 30 MG capsule Take 30 mg by mouth daily.    . fenofibrate (TRICOR) 48 MG tablet Take 1 tablet (48 mg total) by mouth daily. 90 tablet 1  . hydrocortisone 2.5 % cream Apply 1 application topically as needed.    Marland Kitchen lisinopril (PRINIVIL,ZESTRIL) 10 MG tablet Take 1  tablet (10 mg total) by mouth daily. 90 tablet 1  . magnesium oxide (MAG-OX) 400 MG tablet Take 800 mg by mouth daily.    . metoprolol succinate (TOPROL-XL) 100 MG 24 hr tablet Take 1 tablet (100 mg total) by mouth daily. TAKE WITH OR IMMEDIATELY FOLLOWING A MEAL. 90 tablet 1  . Multiple Vitamins-Minerals (MULTIVITAMIN WITH MINERALS) tablet Take 1 tablet by mouth daily.    . Omega-3 Fatty Acids (FISH OIL) 1200 MG CAPS Take 1,200 mg by mouth daily.    . polyethylene glycol (MIRALAX / GLYCOLAX) packet Take 17 g by mouth daily.    . psyllium (METAMUCIL) 58.6 % packet Take 1 packet by mouth daily.     No current facility-administered medications on file prior to visit.     PAST MEDICAL HISTORY: Past Medical History:  Diagnosis Date  . AAA (abdominal aortic aneurysm) ([place])   . Atrial fibrillation ([place])   . CAD (coronary artery disease)    had a MI , s/p CABG  . Cataracts, bilateral    immature  . CHF (congestive heart failure) ([place])   . Dysrhythmia    paroximal a fib  . GERD (gastroesophageal reflux disease)    takes Omeprazole daily  . History of colon polyps   . History of kidney stones   . History of shingles   . Hyperlipidemia   . Hypertension    takes Amlodipine,Metoprolol,and  Lisinopril daily  . Joint pain   . Joint swelling   . Myocardial infarction [facility])    several with last one being 2001(but never knew about any except 2001)  . OSA (obstructive sleep apnea)    started CPAP 12/09  . Peripheral vascular disease ([place])   . Pneumonia    as a child  . Skin cancer    several , one of them was melanoma (sees derm routinely)  . Tingling    feet occasionally  . Tinnitus     PAST SURGICAL HISTORY: Past Surgical History:  Procedure Laterality Date  . ABDOMINAL AORTAGRAM N/A 06/16/2014   Procedure: ABDOMINAL Maxcine Ham;  Surgeon: Angelia Mould, MD;  Location: [facility] CATH LAB;  Service: Cardiovascular;  Laterality: N/A;  . ABDOMINAL AORTIC ENDOVASCULAR STENT GRAFT N/A  07/08/2014   Procedure: ABDOMINAL AORTIC ENDOVASCULAR STENT GRAFT/ GORE;  Surgeon: Angelia Mould, MD;  Location: [facility];  Service: Vascular;  Laterality: N/A;  . CARDIAC CATHETERIZATION  2015  . COLONOSCOPY    . CORONARY ARTERY BYPASS GRAFT  1999   x 3 vessels  . LEFT HEART CATHETERIZATION WITH CORONARY/GRAFT ANGIOGRAM N/A 05/29/2014   Procedure: LEFT HEART CATHETERIZATION WITH [name];  Surgeon: [name], MD;  Location: [facility] CATH LAB;  Service: Cardiovascular;  Laterality: N/A;  . PENILE PROSTHESIS IMPLANT  08/2005  . RHINOPLASTY  1975    SOCIAL HISTORY: Social History  Substance Use Topics  . Smoking status: Former Research scientist (life sciences)  . Smokeless tobacco: Never Used     Comment: quit smoking in 1979  . Alcohol use Yes     Comment: occasionally    FAMILY HISTORY: Family History  Problem Relation Age of Onset  . Prostate cancer Father 78  . Heart disease Father   . Skin cancer Father   . Heart attack Father   . Cancer Father   . Deep vein thrombosis Father   . Hyperlipidemia Father   . Hypertension Father   . Heart disease Mother   . Skin cancer Mother   . Heart attack Mother   . Cancer Mother   . Hyperlipidemia Mother   . Hypertension Mother   . Varicose Veins Mother   . Peripheral vascular disease Mother        amputation  . Sleep apnea Brother   . Hyperlipidemia Brother   . Hypertension Brother   . Colon cancer Other        aunt  . Skin cancer Brother   . Skin cancer Sister   . Cancer Sister   . Heart disease Sister   . Diabetes Neg Hx   . Stroke Neg Hx     ROS: Review of Systems  Constitutional: Positive for weight loss.  Neurological:       Bilateral peripheral neuropathy    PHYSICAL EXAM: Blood pressure (!) 118/57, pulse 60, temperature 97.8 F (36.6 C), temperature source Oral, height 5\' 9"  (1.753 m), weight 217 lb (98.4 kg), SpO2 98 %. Body mass index is 32.05 kg/m. Physical Exam  Constitutional: He is oriented to person,  place, and time. He appears well-developed and well-nourished.  Cardiovascular: Normal rate.   Pulmonary/Chest: Effort normal.  Musculoskeletal: Normal range of motion.  Neurological: He is oriented to person, place, and time.  Skin: Skin is warm and dry.  Psychiatric: He has a normal mood and affect. His behavior is normal.  Vitals reviewed.   RECENT LABS AND TESTS: BMET    Component Value Date/Time   NA  142 01/18/2017 0938   K 4.5 01/18/2017 0938   CL 100 01/18/2017 0938   CO2 26 01/18/2017 0938   GLUCOSE 88 01/18/2017 0938   GLUCOSE 107 (H) 12/07/2016 0852   BUN 17 01/18/2017 0938   CREATININE 1.18 01/18/2017 0938   CREATININE 1.08 12/14/2015 1122   CALCIUM 10.3 (H) 01/18/2017 0938   GFRNONAA 60 01/18/2017 0938   GFRAA 69 01/18/2017 0938   Lab Results  Component Value Date   HGBA1C 5.9 (H) 01/18/2017   HGBA1C 6.1 12/07/2016   Lab Results  Component Value Date   INSULIN 16.1 01/18/2017   CBC    Component Value Date/Time   WBC 4.7 01/18/2017 0938   WBC 5.6 12/07/2016 0852   RBC 4.76 01/18/2017 0938   RBC 4.72 12/07/2016 0852   HGB 15.1 12/07/2016 0852   HCT 43.9 01/18/2017 0938   PLT 185.0 12/07/2016 0852   PLT 156 04/07/2010   MCV 92 01/18/2017 0938   MCH 31.5 01/18/2017 0938   MCH 31.1 12/14/2015 1122   MCHC 34.2 01/18/2017 0938   MCHC 34.3 12/07/2016 0852   RDW 13.1 01/18/2017 0938   LYMPHSABS 2.0 01/18/2017 0938   MONOABS 0.4 12/07/2016 0852   EOSABS 0.1 01/18/2017 0938   BASOSABS 0.0 01/18/2017 0938   Iron/TIBC/Ferritin/ %Sat No results found for: IRON, TIBC, FERRITIN, IRONPCTSAT Lipid Panel     Component Value Date/Time   CHOL 134 01/18/2017 0938   TRIG 224 (H) 01/18/2017 0938   TRIG 256 09/14/2009 0000   HDL 38 (L) 01/18/2017 0938   CHOLHDL 3 12/07/2016 0852   VLDL 30.8 12/07/2016 0852   LDLCALC 51 01/18/2017 0938   LDLDIRECT 68.0 04/13/2015 0823   Hepatic Function Panel     Component Value Date/Time   PROT 7.0 01/18/2017 0938    ALBUMIN 4.7 01/18/2017 0938   AST 32 01/18/2017 0938   ALT 27 01/18/2017 0938   ALKPHOS 61 01/18/2017 0938   BILITOT 0.5 01/18/2017 0938      Component Value Date/Time   TSH 4.960 (H) 01/18/2017 0938   TSH 4.36 12/07/2016 0852   TSH 3.86 04/22/2014 1623    ASSESSMENT AND PLAN: Idiopathic peripheral neuropathy  Class 1 obesity without serious comorbidity with body mass index (BMI) of 32.0 to 32.9 in adult, unspecified obesity type  PLAN:  Bilateral Peripheral Neuropathy Philip [name] is on a higher B vitamin diet which may help to improve his neuropathy. We will follow up in 2 weeks after he has had his neurosurgeon evaluation . We may need to order additional vitamin levels at that time.   We spent > than 50% of the 15 minute visit on the counseling as documented in the note.   Obesity Philip [name] is currently in the action stage of change. As such, his goal is to continue with weight loss efforts He has agreed to follow the Category 3 plan +100 calories Philip [name] has been instructed to work up to a goal of 150 minutes of combined cardio and strengthening exercise per week or start elliptical 5 minutes per day for weight loss and overall health benefits. We discussed the following Behavioral Modification Stratagies today: increasing lean protein intake and decreasing sodium intake  Philip [name] has agreed to follow up with our clinic in 2 to 3 weeks. He was informed of the importance of frequent follow up visits to maximize his success with intensive lifestyle modifications for his multiple health conditions.  Philip [name], am acting as scribe for [name], MD  I have reviewed the above documentation for accuracy and completeness, and I agree with the above. -[name], MD

## 2017-03-13 NOTE — Progress Notes (Signed)
[facility] Clinic Note - Initial Visit   Date: 03/13/17  Philip [name] MRN: [id number] DOB: Aug 06, 1941   Dear Dr. Larose Kells:  Thank you for your kind referral of Philip [name] for consultation of neuropathy. Although his history is well known to you, please allow [place] to reiterate it for the purpose of our medical record. The patient was accompanied to the clinic by self.    History of Present Illness: Philip [name] is a 76 y.o. right-handed Caucasian male with AAA, atrial fibrillation, CAD, CHF, OSA, peripheral vascular disease, and GERD presenting for evaluation of bilateral feet paresthesias.    Starting around 2008, he started having numbness and tingling over the toes and over the years.  Numbness and tingling is present constantly.  Over the past year, he started having left heel pain, described as sharp and shooting.  It can be worse at rest or with weight bearing or at rest. He plays golf several times per week which does not aggravate his left heel pain.  He also complains of intermittent numbness/tingling of the hands when driving.  Repositioning the arms can help.  He denies any weakness of the hand/feet, imbalance, and has not suffered any falls.  He has not tried any medications for the discomfort.  When specifically asked about his symptoms, he reports that his left heel pain is what bothers him the most because it limits his walking and ability to stay active.   Of note, his heart rate is slow at 36 - 40 bpm today.  He denies chest pain, shortness of breath, or lightheadedness and says that his HR tends to be slow.  He takes metoprolol 100mg  daily for rate control.  Out-side paper records, electronic medical record, and images have been reviewed where available and summarized as:  Lab Results  Component Value Date   TSH 4.960 (H) 01/18/2017   Lab Results  Component Value Date   HGBA1C 5.9 (H) 01/18/2017   Lab Results  Component Value Date   [id number] 962 12/07/2016    Past Medical History:  Diagnosis Date  . AAA (abdominal aortic aneurysm) ([place])   . Atrial fibrillation ([place])   . CAD (coronary artery disease)    had a MI , s/p CABG  . Cataracts, bilateral    immature  . CHF (congestive heart failure) ([place])   . Dysrhythmia    paroximal a fib  . GERD (gastroesophageal reflux disease)    takes Omeprazole daily  . History of colon polyps   . History of kidney stones   . History of shingles   . Hyperlipidemia   . Hypertension    takes Amlodipine,Metoprolol,and Lisinopril daily  . Joint pain   . Joint swelling   . Myocardial infarction [facility])    several with last one being 2001(but never knew about any except 2001)  . OSA (obstructive sleep apnea)    started CPAP 12/09  . Peripheral vascular disease ([place])   . Pneumonia    as a child  . Skin cancer    several , one of them was melanoma (sees derm routinely)  . Tingling    feet occasionally  . Tinnitus     Past Surgical History:  Procedure Laterality Date  . ABDOMINAL AORTAGRAM N/A 06/16/2014   Procedure: ABDOMINAL Maxcine Ham;  Surgeon: Angelia Mould, MD;  Location[facility] CATH LAB;  Service: Cardiovascular;  Laterality: N/A;  . ABDOMINAL AORTIC ENDOVASCULAR STENT GRAFT N/A 07/08/2014   Procedure: ABDOMINAL AORTIC ENDOVASCULAR  STENT GRAFT/ GORE;  Surgeon: Angelia Mould, MD;  Location: [place];  Service: Vascular;  Laterality: N/A;  . CARDIAC CATHETERIZATION  2015  . COLONOSCOPY    . CORONARY ARTERY BYPASS GRAFT  1999   x 3 vessels  . LEFT HEART CATHETERIZATION WITH CORONARY/GRAFT ANGIOGRAM N/A 05/29/2014   Procedure: LEFT HEART CATHETERIZATION WITH [name];  Surgeon: [name], MD;  Location: [facility] CATH LAB;  Service: Cardiovascular;  Laterality: N/A;  . PENILE PROSTHESIS IMPLANT  08/2005  . RHINOPLASTY  1975     Medications:  Outpatient Encounter Prescriptions as of 03/13/2017  Medication Sig  . amLODipine (NORVASC) 10 MG tablet  Take 1 tablet (10 mg total) by mouth daily.  Marland Kitchen apixaban (ELIQUIS) 5 MG TABS tablet Take 1 tablet (5 mg total) by mouth 2 (two) times daily.  Marland Kitchen atorvastatin (LIPITOR) 40 MG tablet Take 1 tablet (40 mg total) by mouth at bedtime.  . B Complex-Biotin-FA (B-COMPLEX PO) Take 1 tablet by mouth daily.  . Cholecalciferol (VITAMIN D3) 5000 UNITS CAPS Take 5,000 Units by mouth daily.  Marland Kitchen co-enzyme Q-10 30 MG capsule Take 30 mg by mouth daily.  . fenofibrate (TRICOR) 48 MG tablet Take 1 tablet (48 mg total) by mouth daily.  . hydrocortisone 2.5 % cream Apply 1 application topically as needed.  Marland Kitchen lisinopril (PRINIVIL,ZESTRIL) 10 MG tablet Take 1 tablet (10 mg total) by mouth daily.  . magnesium oxide (MAG-OX) 400 MG tablet Take 800 mg by mouth daily.  . metoprolol succinate (TOPROL-XL) 100 MG 24 hr tablet Take 1 tablet (100 mg total) by mouth daily. TAKE WITH OR IMMEDIATELY FOLLOWING A MEAL.  . Multiple Vitamins-Minerals (MULTIVITAMIN WITH MINERALS) tablet Take 1 tablet by mouth daily.  . Omega-3 Fatty Acids (FISH OIL) 1200 MG CAPS Take 1,200 mg by mouth daily.  . polyethylene glycol (MIRALAX / GLYCOLAX) packet Take 17 g by mouth daily.  . psyllium (METAMUCIL) 58.6 % packet Take 1 packet by mouth daily.   No facility-administered encounter medications on file as of 03/13/2017.      Allergies: No Known Allergies  Family History: Family History  Problem Relation Age of Onset  . Prostate cancer Father 43  . Heart disease Father   . Skin cancer Father   . Heart attack Father   . Cancer Father   . Deep vein thrombosis Father   . Hyperlipidemia Father   . Hypertension Father   . Heart disease Mother   . Skin cancer Mother   . Heart attack Mother   . Cancer Mother   . Hyperlipidemia Mother   . Hypertension Mother   . Varicose Veins Mother   . Peripheral vascular disease Mother        amputation  . Sleep apnea Brother   . Hyperlipidemia Brother   . Hypertension Brother   . Colon cancer Other          aunt  . Skin cancer Brother   . Skin cancer Sister   . Cancer Sister   . Heart disease Sister   . Diabetes Neg Hx   . Stroke Neg Hx     Social History: Social History  Substance Use Topics  . Smoking status: Former Research scientist (life sciences)  . Smokeless tobacco: Never Used     Comment: quit smoking in 1979  . Alcohol use Yes     Comment: occasionally - once per week   Social History   Social History Narrative   Lives w/ wife in a 2  story home.  Has one son and 2 stepchildren.  Retired for [employer].     Education: high school.       Review of Systems:  CONSTITUTIONAL: No fevers, chills, night sweats, or weight loss.   EYES: No visual changes or eye pain ENT: No hearing changes.  No history of nose bleeds.   RESPIRATORY: No cough, wheezing and shortness of breath.   CARDIOVASCULAR: Negative for chest pain, and palpitations.   GI: Negative for abdominal discomfort, blood in stools or black stools.  No recent change in bowel habits.   GU:  No history of incontinence.   MUSCLOSKELETAL: +history of joint pain or swelling.  No myalgias.   SKIN: Negative for lesions, rash, and itching.   HEMATOLOGY/ONCOLOGY: Negative for prolonged bleeding, bruising easily, and swollen nodes.  +history of cancer.   ENDOCRINE: Negative for cold or heat intolerance, polydipsia or goiter.   PSYCH:  No depression or anxiety symptoms.   NEURO: As Above.   Vital Signs:  BP 134/74   Pulse (!) 36   Ht 5\' 9"  (1.753 m)   Wt 222 lb (100.7 kg)   SpO2 96%   BMI 32.78 kg/m    General Medical Exam:   General:  Well appearing, comfortable.   Eyes/ENT: see cranial nerve examination.   Neck: No masses appreciated.  Full range of motion without tenderness.  No carotid bruits. Respiratory:  Clear to auscultation, good air entry bilaterally.   Cardiac:  Bradycardic, irregularly irregular, no murmur.   Extremities:  No deformities, edema, or skin discoloration.  There is point tenderness over the distal calcaneus bone on  the left side only. Skin:  No rashes or lesions.  Neurological Exam: MENTAL STATUS including orientation to time, place, person, recent and remote memory, attention span and concentration, language, and fund of knowledge is normal.  Speech is not dysarthric.  CRANIAL NERVES: II:  No visual field defects.  Unremarkable fundi.   III-IV-VI: Pupils equal round and reactive to light.  Normal conjugate, extra-ocular eye movements in all directions of gaze.  No nystagmus.  No ptosis.   V:  Normal facial sensation.   VII:  Normal facial symmetry and movements. VIII:  Normal hearing and vestibular function.   IX-X:  Normal palatal movement.   XI:  Normal shoulder shrug and head rotation.   XII:  Normal tongue strength and range of motion, no deviation or fasciculation.  MOTOR:  No atrophy, fasciculations or abnormal movements.  No pronator drift.  Tone is normal.    Right Upper Extremity:    Left Upper Extremity:    Deltoid  5/5   Deltoid  5/5   Biceps  5/5   Biceps  5/5   Triceps  5/5   Triceps  5/5   Wrist extensors  5/5   Wrist extensors  5/5   Wrist flexors  5/5   Wrist flexors  5/5   Finger extensors  5/5   Finger extensors  5/5   Finger flexors  5/5   Finger flexors  5/5   Dorsal interossei  5/5   Dorsal interossei  5/5   Abductor pollicis  5/5   Abductor pollicis  5/5   Tone (Ashworth scale)  0  Tone (Ashworth scale)  0   Right Lower Extremity:    Left Lower Extremity:    Hip flexors  5/5   Hip flexors  5/5   Hip extensors  5/5   Hip extensors  5/5   Knee flexors  5/5   Knee flexors  5/5   Knee extensors  5/5   Knee extensors  5/5   Dorsiflexors  5/5   Dorsiflexors  5/5   Plantarflexors  5/5   Plantarflexors  5/5   Toe extensors  5/5   Toe extensors  5/5   Toe flexors  5-/5   Toe flexors  5-/5   Tone (Ashworth scale)  0  Tone (Ashworth scale)  0   MSRs:  Right                                                                 Left brachioradialis 2+  brachioradialis 2+  biceps  2+  biceps 2+  triceps 2+  triceps 2+  patellar 2+  patellar 2+  ankle jerk 0  ankle jerk 0  Hoffman no  Hoffman no  plantar response down  plantar response down   SENSORY:  Vibration is absent distal to ankles bilaterally.  Temperature is mildly reduced at the feet.  Proprioception and pin prick intact.  There is mild sway with Rhomberg testing.  COORDINATION/GAIT: Normal finger-to- nose-finger.  Intact rapid alternating movements bilaterally. Gait narrow based and stable. He is unable to walk in his left heel due to pain, but is able to stand on the right heel.  Tandem gait intact.   IMPRESSION: 1.  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  No family history of neuropathy or personal history of diabetes, alcohol, or vitamin B12 deficiency.  NCS/EMG was declined by patient.  TSH, vitamin B12, and folate which is normal.  Recommend checking copper and SPEP with [facility] to complete work-up for secondary causes. It was explained that he most likely had idiopathic neuropathy and management is symptomatic.   He does not wish to take any medication for his paresthesias.   2.  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 He does not wish to do additional testing and will return, if symptoms get worse.  3.  Left heel pain, ?bone spur.  This does not seem related to his neuropathy as there is point tenderness with palpation of the distal calcaneus.  Referral to podiatry for assessment.   4.  Bradycardia, asymptomatic and clinically stable. Follow-up with cardiology.  Return to clinic as needed   The duration of this appointment visit was 50 minutes of face-to-face time with the patient.  Greater than 50% of this time was spent in counseling, explanation of diagnosis, planning of further management, and coordination of care.   Thank you for allowing me to participate in patient's care.  If I can answer any additional  questions, I would be pleased to do so.    Sincerely,    Donika K. Posey Pronto, DO

## 2017-03-15 LAB — PROTEIN ELECTROPHORESIS, SERUM
Albumin ELP: 4.3 g/dL (ref 3.8–4.8)
Alpha-1-Globulin: 0.3 g/dL (ref 0.2–0.3)
Alpha-2-Globulin: 0.6 g/dL (ref 0.5–0.9)
BETA 2: 0.3 g/dL (ref 0.2–0.5)
Beta Globulin: 0.4 g/dL (ref 0.4–0.6)
GAMMA GLOBULIN: 0.8 g/dL (ref 0.8–1.7)
Total Protein, Serum Electrophoresis: 6.6 g/dL (ref 6.1–8.1)

## 2017-03-16 LAB — IMMUNOFIXATION ELECTROPHORESIS
IgA: 160 mg/dL (ref 81–463)
IgG (Immunoglobin G), Serum: 773 mg/dL (ref 694–1618)
IgM, Serum: 77 mg/dL (ref 48–271)

## 2017-03-16 LAB — COPPER, SERUM: COPPER: 101 ug/dL (ref 70–175)

## 2017-03-17 ENCOUNTER — Telehealth: Payer: Self-pay | Admitting: Neurology

## 2017-03-17 NOTE — Telephone Encounter (Signed)
Caller: PT  Urgent? No  Reason for the call: PT said Dr Posey Pronto was referring him to a podiatrist and he has not heard anything

## 2017-03-17 NOTE — Telephone Encounter (Signed)
Caryl Pina please address.

## 2017-03-20 NOTE — Telephone Encounter (Signed)
I called [facility] and they will give the patient a call today.

## 2017-03-29 ENCOUNTER — Ambulatory Visit (INDEPENDENT_AMBULATORY_CARE_PROVIDER_SITE_OTHER): Payer: Medicare HMO | Admitting: Family Medicine

## 2017-03-29 VITALS — BP 118/68 | HR 54 | Temp 97.9°F | Ht 69.0 in | Wt 213.0 lb

## 2017-03-29 DIAGNOSIS — E559 Vitamin D deficiency, unspecified: Secondary | ICD-10-CM | POA: Insufficient documentation

## 2017-03-29 DIAGNOSIS — E669 Obesity, unspecified: Secondary | ICD-10-CM | POA: Diagnosis not present

## 2017-03-29 DIAGNOSIS — E782 Mixed hyperlipidemia: Secondary | ICD-10-CM

## 2017-03-29 DIAGNOSIS — Z6831 Body mass index (BMI) 31.0-31.9, adult: Secondary | ICD-10-CM | POA: Diagnosis not present

## 2017-03-29 NOTE — Progress Notes (Signed)
Office: [phone]  /  Fax: [phone]   HPI:   Chief Complaint: OBESITY Philip [name] is here to discuss his progress with his obesity treatment plan. He is on the  follow the Category 3 plan and is following his eating plan approximately 95 % of the time. He states he is exercising elliptical 5 to 20 minutes 7 times per week. Philip [name] continues to do well with weight loss. Hunger is controlled and he is exercising daily. He still struggles to make good choices when he eats out. He is deviating from his plan more in the last month but is working on increasing protein. His weight is 213 lb (96.6 kg) today and has had a weight loss of 4 pounds over a period of 2 weeks since his last visit. He has lost 17 lbs since starting treatment with [place].  Vitamin D deficiency Trea has a diagnosis of vitamin D deficiency. He is on OTC vit D 5,000 IU daily but stopped last month when he ran out. He is still on multi-vitamin daily. He denies nausea, vomiting or muscle weakness.  Mixed Hyperlipidemia Philip [name] has hyperlipidemia, last triglycerides were elevated at 224 and HDL low at 38.  He is doing well with weight loss, diet and exercise and he denies any chest pain, claudication or myalgias. Philip [name] is on Lipitor.  ALLERGIES: No Known Allergies  MEDICATIONS: Current Outpatient Prescriptions on File Prior to Visit  Medication Sig Dispense Refill  . amLODipine (NORVASC) 10 MG tablet Take 1 tablet (10 mg total) by mouth daily. 90 tablet 1  . apixaban (ELIQUIS) 5 MG TABS tablet Take 1 tablet (5 mg total) by mouth 2 (two) times daily. 180 tablet 1  . atorvastatin (LIPITOR) 40 MG tablet Take 1 tablet (40 mg total) by mouth at bedtime. 90 tablet 1  . B Complex-Biotin-FA (B-COMPLEX PO) Take 1 tablet by mouth daily.    Marland Kitchen co-enzyme Q-10 30 MG capsule Take 30 mg by mouth daily.    . fenofibrate (TRICOR) 48 MG tablet Take 1 tablet (48 mg total) by mouth daily. 90 tablet 1  . hydrocortisone 2.5 % cream Apply 1  application topically as needed.    Marland Kitchen lisinopril (PRINIVIL,ZESTRIL) 10 MG tablet Take 1 tablet (10 mg total) by mouth daily. 90 tablet 1  . magnesium oxide (MAG-OX) 400 MG tablet Take 800 mg by mouth daily.    . metoprolol succinate (TOPROL-XL) 100 MG 24 hr tablet Take 1 tablet (100 mg total) by mouth daily. TAKE WITH OR IMMEDIATELY FOLLOWING A MEAL. 90 tablet 1  . Multiple Vitamins-Minerals (MULTIVITAMIN WITH MINERALS) tablet Take 1 tablet by mouth daily.    . Omega-3 Fatty Acids (FISH OIL) 1200 MG CAPS Take 1,200 mg by mouth daily.    . polyethylene glycol (MIRALAX / GLYCOLAX) packet Take 17 g by mouth daily.    . psyllium (METAMUCIL) 58.6 % packet Take 1 packet by mouth daily.    . Cholecalciferol (VITAMIN D3) 5000 UNITS CAPS Take 5,000 Units by mouth daily.     No current facility-administered medications on file prior to visit.     PAST MEDICAL HISTORY: Past Medical History:  Diagnosis Date  . AAA (abdominal aortic aneurysm) ([place])   . Atrial fibrillation ([place])   . CAD (coronary artery disease)    had a MI , s/p CABG  . Cataracts, bilateral    immature  . CHF (congestive heart failure) ([place])   . Dysrhythmia    paroximal a fib  . GERD (gastroesophageal  reflux disease)    takes Omeprazole daily  . History of colon polyps   . History of kidney stones   . History of shingles   . Hyperlipidemia   . Hypertension    takes Amlodipine,Metoprolol,and Lisinopril daily  . Joint pain   . Joint swelling   . Myocardial infarction [facility])    several with last one being 2001(but never knew about any except 2001)  . OSA (obstructive sleep apnea)    started CPAP 12/09  . Peripheral vascular disease ([place])   . Pneumonia    as a child  . Skin cancer    several , one of them was melanoma (sees derm routinely)  . Tingling    feet occasionally  . Tinnitus     PAST SURGICAL HISTORY: Past Surgical History:  Procedure Laterality Date  . ABDOMINAL AORTAGRAM N/A 06/16/2014   Procedure:  ABDOMINAL Maxcine Ham;  Surgeon: Angelia Mould, MD;  Location: [facility] CATH LAB;  Service: Cardiovascular;  Laterality: N/A;  . ABDOMINAL AORTIC ENDOVASCULAR STENT GRAFT N/A 07/08/2014   Procedure: ABDOMINAL AORTIC ENDOVASCULAR STENT GRAFT/ GORE;  Surgeon: Angelia Mould, MD;  Location: [facility];  Service: Vascular;  Laterality: N/A;  . CARDIAC CATHETERIZATION  2015  . COLONOSCOPY    . CORONARY ARTERY BYPASS GRAFT  1999   x 3 vessels  . LEFT HEART CATHETERIZATION WITH CORONARY/GRAFT ANGIOGRAM N/A 05/29/2014   Procedure: LEFT HEART CATHETERIZATION WITH [name];  Surgeon: [name], MD;  Location[facility] C[facility] LAB;  Service: Cardiovascular;  Laterality: N/A;  . PENILE PROSTHESIS IMPLANT  08/2005  . RHINOPLASTY  1975    SOCIAL HISTORY: Social History  Substance Use Topics  . Smoking status: Former Research scientist (life sciences)  . Smokeless tobacco: Never Used     Comment: quit smoking in 1979  . Alcohol use Yes     Comment: occasionally - once per week    FAMILY HISTORY: Family History  Problem Relation Age of Onset  . Prostate cancer Father 43  . Heart disease Father   . Skin cancer Father   . Heart attack Father   . Cancer Father   . Deep vein thrombosis Father   . Hyperlipidemia Father   . Hypertension Father   . Heart disease Mother   . Skin cancer Mother   . Heart attack Mother   . Cancer Mother   . Hyperlipidemia Mother   . Hypertension Mother   . Varicose Veins Mother   . Peripheral vascular disease Mother        amputation  . Sleep apnea Brother   . Hyperlipidemia Brother   . Hypertension Brother   . Colon cancer Other        aunt  . Skin cancer Brother   . Skin cancer Sister   . Cancer Sister   . Heart disease Sister   . Diabetes Neg Hx   . Stroke Neg Hx     ROS: Review of Systems  Constitutional: Positive for weight loss.  Cardiovascular: Negative for chest pain and claudication.  Gastrointestinal: Negative for nausea and vomiting.  Musculoskeletal:  Negative for myalgias.       Negative muscle weakness    PHYSICAL EXAM: Blood pressure 118/68, pulse (!) 54, temperature 97.9 F (36.6 C), temperature source Oral, height 5\' 9"  (1.753 m), weight 213 lb (96.6 kg), SpO2 98 %. Body mass index is 31.45 kg/m. Physical Exam  Constitutional: He is oriented to person, place, and time. He appears well-developed and well-nourished.  Cardiovascular:  Normal rate.   Pulmonary/Chest: Effort normal.  Musculoskeletal: Normal range of motion.  Neurological: He is oriented to person, place, and time.  Skin: Skin is warm and dry.  Psychiatric: He has a normal mood and affect. His behavior is normal.  Vitals reviewed.   RECENT LABS AND TESTS: BMET    Component Value Date/Time   NA 142 01/18/2017 0938   K 4.5 01/18/2017 0938   CL 100 01/18/2017 0938   CO2 26 01/18/2017 0938   GLUCOSE 88 01/18/2017 0938   GLUCOSE 107 (H) 12/07/2016 0852   BUN 17 01/18/2017 0938   CREATININE 1.18 01/18/2017 0938   CREATININE 1.08 12/14/2015 1122   CALCIUM 10.3 (H) 01/18/2017 0938   GFRNONAA 60 01/18/2017 0938   GFRAA 69 01/18/2017 0938   Lab Results  Component Value Date   HGBA1C 5.9 (H) 01/18/2017   HGBA1C 6.1 12/07/2016   Lab Results  Component Value Date   INSULIN 16.1 01/18/2017   CBC    Component Value Date/Time   WBC 4.7 01/18/2017 0938   WBC 5.6 12/07/2016 0852   RBC 4.76 01/18/2017 0938   RBC 4.72 12/07/2016 0852   HGB 15.1 12/07/2016 0852   HCT 43.9 01/18/2017 0938   PLT 185.0 12/07/2016 0852   PLT 156 04/07/2010   MCV 92 01/18/2017 0938   MCH 31.5 01/18/2017 0938   MCH 31.1 12/14/2015 1122   MCHC 34.2 01/18/2017 0938   MCHC 34.3 12/07/2016 0852   RDW 13.1 01/18/2017 0938   LYMPHSABS 2.0 01/18/2017 0938   MONOABS 0.4 12/07/2016 0852   EOSABS 0.1 01/18/2017 0938   BASOSABS 0.0 01/18/2017 0938   Iron/TIBC/Ferritin/ %Sat No results found for: IRON, TIBC, FERRITIN, IRONPCTSAT Lipid Panel     Component Value Date/Time   CHOL 134  01/18/2017 0938   TRIG 224 (H) 01/18/2017 0938   TRIG 256 09/14/2009 0000   HDL 38 (L) 01/18/2017 0938   CHOLHDL 3 12/07/2016 0852   VLDL 30.8 12/07/2016 0852   LDLCALC 51 01/18/2017 0938   LDLDIRECT 68.0 04/13/2015 0823   Hepatic Function Panel     Component Value Date/Time   PROT 7.0 01/18/2017 0938   ALBUMIN 4.7 01/18/2017 0938   AST 32 01/18/2017 0938   ALT 27 01/18/2017 0938   ALKPHOS 61 01/18/2017 0938   BILITOT 0.5 01/18/2017 0938      Component Value Date/Time   TSH 4.960 (H) 01/18/2017 0938   TSH 4.36 12/07/2016 0852   TSH 3.86 04/22/2014 1623    ASSESSMENT AND PLAN: Mixed hyperlipidemia  Vitamin D deficiency  Class 1 obesity without serious comorbidity with body mass index (BMI) of 31.0 to 31.9 in adult, unspecified obesity type  PLAN:  Vitamin D Deficiency Ferrel was informed that low vitamin D levels contributes to fatigue and are associated with obesity, breast, and colon cancer. He agrees to continue to take OTC vitamin D in multi-vitamin and we will plan to re-check labs in 3 to 4 weeks and will follow up for routine testing of vitamin D, at least 2-3 times per year. He was informed of the risk of over-replacement of vitamin D and agrees to not increase his dose unless he discusses this with [place] first. Linford agrees to follow up with our clinic in 3 to 4 weeks.  Mixed Hyperlipidemia Asier was informed of the American Heart Association Guidelines emphasizing intensive lifestyle modifications as the first line treatment for hyperlipidemia. We discussed many lifestyle modifications today in depth, and Kevron will continue to  work on decreasing saturated fats such as fatty red meat, butter and many fried foods. He will also increase vegetables and lean protein in his diet and continue to work on exercise and weight loss efforts. We will check labs in 3 to 4 weeks. Keeon agrees to continue Lipitor and follow up with our clinic in 3 to 4 weeks.  Obesity Hilman is  currently in the action stage of change. As such, his goal is to continue with weight loss efforts He has agreed to follow the Category 3 plan +100 calories Adolphe has been instructed to work up to a goal of 150 minutes of combined cardio and strengthening exercise per week for weight loss and overall health benefits. We discussed the following Behavioral Modification Strategies today: increasing lean protein intake, decreasing simple carbohydrates , decrease eating out and dealing with family or coworker sabotage  Willmar has agreed to follow up with our clinic in 3 to 4 weeks. He was informed of the importance of frequent follow up visits to maximize his success with intensive lifestyle modifications for his multiple health conditions.  I, Doreene Nest, am acting as scribe for [name], MD  I have reviewed the above documentation for accuracy and completeness, and I agree with the above. -[name], MD  OBESITY BEHAVIORAL INTERVENTION VISIT  Today's visit was # 5 out of 22.  Starting weight: 230 lbs Starting date: 01/18/17 Today's weight : 213 lbs Today's date: 03/29/2017 Total lbs lost to date: 17 (Patients must lose 7 lbs in the first 6 months to continue with counseling)   ASK: We discussed the diagnosis of obesity with [name] today and Zion agreed to give [place] permission to discuss obesity behavioral modification therapy today.  ASSESS: Jawara has the diagnosis of obesity and his BMI today is 31.5 Pritesh is in the action stage of change   ADVISE: Webb was educated on the multiple health risks of obesity as well as the benefit of weight loss to improve his health. He was advised of the need for long term treatment and the importance of lifestyle modifications.  AGREE: Multiple dietary modification options and treatment options were discussed and  Antwain agreed to follow the Category 3 plan +100 calories We discussed the following Behavioral Modification  Strategies today: increasing lean protein intake, decreasing simple carbohydrates , decrease eating out and dealing with family or coworker sabotage

## 2017-04-03 ENCOUNTER — Ambulatory Visit: Payer: Medicare HMO | Admitting: Podiatry

## 2017-04-03 DIAGNOSIS — G4733 Obstructive sleep apnea (adult) (pediatric): Secondary | ICD-10-CM | POA: Diagnosis not present

## 2017-04-07 ENCOUNTER — Ambulatory Visit (INDEPENDENT_AMBULATORY_CARE_PROVIDER_SITE_OTHER): Payer: Medicare HMO

## 2017-04-07 ENCOUNTER — Encounter: Payer: Self-pay | Admitting: Podiatry

## 2017-04-07 ENCOUNTER — Ambulatory Visit (INDEPENDENT_AMBULATORY_CARE_PROVIDER_SITE_OTHER): Payer: Medicare HMO | Admitting: Podiatry

## 2017-04-07 DIAGNOSIS — M722 Plantar fascial fibromatosis: Secondary | ICD-10-CM

## 2017-04-07 MED ORDER — TRIAMCINOLONE ACETONIDE 10 MG/ML IJ SUSP: 10.0000 mg | Freq: Once | INTRAMUSCULAR | Status: DC

## 2017-04-07 NOTE — Patient Instructions (Signed)

## 2017-04-07 NOTE — Progress Notes (Signed)
   Subjective:    Patient ID: Philip [name], male    DOB: 1941-10-29, 76 y.o.   MRN: [id number]  HPI  76 year old male presents the office today for concerns of left heel pain which has been ongoing for several months. He describes as a gradual onset. He has no pain at night. The pain is intermittent. He said no recent treatment. No numbness or tingling. The pain does not wake him up at night. He has no other concerns today.  Review of Systems  All other systems reviewed and are negative.      Objective:   Physical Exam General: AAO x3, NAD  Dermatological: Skin is warm, dry and supple bilateral. Nails x 10 are well manicured; remaining integument appears unremarkable at this time. There are no open sores, no preulcerative lesions, no rash or signs of infection present.  Vascular: Dorsalis Pedis artery and Posterior Tibial artery pedal pulses are 2/4 bilateral with immedate capillary fill time. There is no pain with calf compression, swelling, warmth, erythema.   Neruologic: Grossly intact via light touch bilateral. Vibratory intact via tuning fork bilateral. Protective threshold with Semmes Wienstein monofilament intact to all pedal sites bilateral. Negative tinel sign.   Musculoskeletal: Tenderness to palpation along the plantar medial tubercle of the calcaneus at the insertion of plantar fascia on the left foot. There is no pain along the course of the plantar fascia within the arch of the foot. Plantar fascia appears to be intact. There is no pain with lateral compression of the calcaneus or pain with vibratory sensation. There is no pain along the course or insertion of the achilles tendon. No other areas of tenderness to bilateral lower extremities. Muscular strength 5/5 in all groups tested bilateral.  Gait: Unassisted, Nonantalgic.      Assessment & Plan:  76 year old male with left heel pain, likely plantar fasciitis. -Treatment options discussed including all alternatives,  risks, and complications -Etiology of symptoms were discussed -X-rays were obtained and reviewed with the patient. No evidence of acute fracture identified. -Patient elects to proceed with steroid injection into the lefty heel. Under sterile skin preparation, a total of 2.5cc of kenalog 10, 0.5% Marcaine plain, and 2% lidocaine plain were infiltrated into the symptomatic area without complication. A band-aid was applied. Patient tolerated the injection well without complication. Post-injection care with discussed with the patient. Discussed with the patient to ice the area over the next couple of days to help prevent a steroid flare.  -Removable plantar fascial taping applied. We were unfortunately out of braces.  -Stretching, icing exercises daily. -Discussed shoe gear modifications and orthotics. -RTC 3 weeks or sooner if needed.  Celesta Gentile, DPM

## 2017-04-10 ENCOUNTER — Telehealth: Payer: Self-pay | Admitting: *Deleted

## 2017-04-10 ENCOUNTER — Ambulatory Visit: Payer: Medicare HMO | Admitting: Internal Medicine

## 2017-04-10 NOTE — Telephone Encounter (Signed)
Pt states he got the exercises for his foot, but no information as to how long to do or how often. I explained to pt that the standing ones should be at least twice daily for 1 minute each foot, and the seated ones perform at least twice daily for 1 minute, but best if perform prior to getting up from a seated or resting position, it would preempt the abrupt pulling or stretching often felt in the area when rising. Pt asked how long to ice. I told pt to ice at least 3-4 times daily for 15-20 minutes each episode, to decrease inflammation, swelling and pain. Pt states understanding.

## 2017-04-11 DIAGNOSIS — M722 Plantar fascial fibromatosis: Secondary | ICD-10-CM | POA: Insufficient documentation

## 2017-04-19 ENCOUNTER — Ambulatory Visit (INDEPENDENT_AMBULATORY_CARE_PROVIDER_SITE_OTHER): Payer: Medicare HMO | Admitting: Family Medicine

## 2017-04-19 VITALS — BP 122/70 | HR 80 | Temp 97.3°F | Ht 69.0 in | Wt 209.0 lb

## 2017-04-19 DIAGNOSIS — R739 Hyperglycemia, unspecified: Secondary | ICD-10-CM | POA: Insufficient documentation

## 2017-04-19 DIAGNOSIS — Z9189 Other specified personal risk factors, not elsewhere classified: Secondary | ICD-10-CM

## 2017-04-19 DIAGNOSIS — R7989 Other specified abnormal findings of blood chemistry: Secondary | ICD-10-CM

## 2017-04-19 DIAGNOSIS — I10 Essential (primary) hypertension: Secondary | ICD-10-CM

## 2017-04-19 DIAGNOSIS — R946 Abnormal results of thyroid function studies: Secondary | ICD-10-CM

## 2017-04-19 DIAGNOSIS — R7303 Prediabetes: Secondary | ICD-10-CM | POA: Diagnosis not present

## 2017-04-19 DIAGNOSIS — E785 Hyperlipidemia, unspecified: Secondary | ICD-10-CM

## 2017-04-19 DIAGNOSIS — E559 Vitamin D deficiency, unspecified: Secondary | ICD-10-CM

## 2017-04-19 DIAGNOSIS — Z6830 Body mass index (BMI) 30.0-30.9, adult: Secondary | ICD-10-CM

## 2017-04-19 NOTE — Progress Notes (Signed)
Office: [phone]  /  Fax: [phone]   HPI:   Chief Complaint: OBESITY Philip [name] is here to discuss his progress with his obesity treatment plan. He is on the  follow the Category 3 plan and is following his eating plan approximately 90 % of the time. He states he is exercising 0 minutes 0 times per week. Bashar continues to do well with weight loss. He plans his meals ahead of time well. His weight is 209 lb (94.8 kg) today and has had a weight loss of 4 pounds over a period of 3 weeks since his last visit. He has lost 21 lbs since starting treatment with [place].  Vitamin D deficiency Philip [name] has a diagnosis of vitamin D deficiency. He is currently taking OTC vit D and is at goal. He denies nausea, vomiting or muscle weakness.  Pre-Diabetes Philip [name] has a diagnosis of pre-diabetes based on his elevated Hgb A1c at 5.9 and was informed this puts him at greater risk of developing diabetes. He is not taking metformin currently and continues to work on diet and exercise to decrease risk of diabetes. He denies nausea, polyphagia or hypoglycemia.  Hypertension Philip [name] is a 76 y.o. male with hypertension.  Philip [name] denies chest pain, headache or shortness of breath on exertion. He is working weight loss to help control his blood pressure with the goal of decreasing his risk of heart attack and stroke. Philip [name] blood pressure is currently controlled.  Hyperlipidemia Philip [name] has hyperlipidemia and has been trying to improve his cholesterol levels with intensive lifestyle modification including a low saturated fat diet, exercise and weight loss. He denies any chest pain, shortness of breath, claudication or myalgias.  Elevated TSH Philip [name] has elevated TSH and is not on medications. He denies heat/cold intolerance.   ALLERGIES: No Known Allergies  MEDICATIONS: Current Outpatient Prescriptions on File Prior to Visit  Medication Sig Dispense Refill   amLODipine (NORVASC) 10 MG tablet  Take 1 tablet (10 mg total) by mouth daily. 90 tablet 1   apixaban (ELIQUIS) 5 MG TABS tablet Take 1 tablet (5 mg total) by mouth 2 (two) times daily. 180 tablet 1   atorvastatin (LIPITOR) 40 MG tablet Take 1 tablet (40 mg total) by mouth at bedtime. 90 tablet 1   B Complex-Biotin-FA (B-COMPLEX PO) Take 1 tablet by mouth daily.     Cholecalciferol (VITAMIN D3) 5000 UNITS CAPS Take 5,000 Units by mouth daily.     co-enzyme Q-10 30 MG capsule Take 30 mg by mouth daily.     fenofibrate (TRICOR) 48 MG tablet Take 1 tablet (48 mg total) by mouth daily. 90 tablet 1   hydrocortisone 2.5 % cream Apply 1 application topically as needed.     lisinopril (PRINIVIL,ZESTRIL) 10 MG tablet Take 1 tablet (10 mg total) by mouth daily. 90 tablet 1   magnesium oxide (MAG-OX) 400 MG tablet Take 800 mg by mouth daily.     metoprolol succinate (TOPROL-XL) 100 MG 24 hr tablet Take 1 tablet (100 mg total) by mouth daily. TAKE WITH OR IMMEDIATELY FOLLOWING A MEAL. 90 tablet 1   Multiple Vitamins-Minerals (MULTIVITAMIN WITH MINERALS) tablet Take 1 tablet by mouth daily.     Omega-3 Fatty Acids (FISH OIL) 1200 MG CAPS Take 1,200 mg by mouth daily.     polyethylene glycol (MIRALAX / GLYCOLAX) packet Take 17 g by mouth daily.     psyllium (METAMUCIL) 58.6 % packet Take 1 packet by mouth daily.  Current Facility-Administered Medications on File Prior to Visit  Medication Dose Route Frequency Provider Last Rate Last Dose   triamcinolone acetonide (KENALOG) 10 MG/ML injection 10 mg  10 mg Other Once Trula Slade, DPM        PAST MEDICAL HISTORY: Past Medical History:  Diagnosis Date   AAA (abdominal aortic aneurysm) ([place])    Atrial fibrillation ([place])    CAD (coronary artery disease)    had a MI , s/p CABG   Cataracts, bilateral    immature   CHF (congestive heart failure) ([facility])    Dysrhythmia    paroximal a fib   GERD (gastroesophageal reflux disease)    takes Omeprazole daily    History of colon polyps    History of kidney stones    History of shingles    Hyperlipidemia    Hypertension    takes Amlodipine,Metoprolol,and Lisinopril daily   Joint pain    Joint swelling    Myocardial infarction ([place])    several with last one being 2001(but never knew about any except 2001)   OSA (obstructive sleep apnea)    started CPAP 12/09   Peripheral vascular disease ([place])    Pneumonia    as a child   Skin cancer    several , one of them was melanoma (sees derm routinely)   Tingling    feet occasionally   Tinnitus     PAST SURGICAL HISTORY: Past Surgical History:  Procedure Laterality Date   ABDOMINAL AORTAGRAM N/A 06/16/2014   Procedure: ABDOMINAL Maxcine Ham;  Surgeon: Angelia Mould, MD;  Location: [facility] C[facility] LAB;  Service: Cardiovascular;  Laterality: N/A;   ABDOMINAL AORTIC ENDOVASCULAR STENT GRAFT N/A 07/08/2014   Procedure: ABDOMINAL AORTIC ENDOVASCULAR STENT GRAFT/ GORE;  Surgeon: Angelia Mould, MD;  Location: [place];  Service: Vascular;  Laterality: N/A;   CARDIAC CATHETERIZATION  2015   COLONOSCOPY     CORONARY ARTERY BYPASS GRAFT  1999   x 3 vessels   LEFT HEART CATHETERIZATION WITH CORONARY/GRAFT ANGIOGRAM N/A 05/29/2014   Procedure: LEFT HEART CATHETERIZATION WITH [name];  Surgeon: [name], MD;  Location: [facility] CATH LAB;  Service: Cardiovascular;  Laterality: N/A;   PENILE PROSTHESIS IMPLANT  08/2005   RHINOPLASTY  1975    SOCIAL HISTORY: Social History  Substance Use Topics   Smoking status: Former Smoker   Smokeless tobacco: Never Used     Comment: quit smoking in 1979   Alcohol use Yes     Comment: occasionally - once per week    FAMILY HISTORY: Family History  Problem Relation Age of Onset   Prostate cancer Father 39   Heart disease Father    Skin cancer Father    Heart attack Father    Cancer Father    Deep vein thrombosis Father    Hyperlipidemia Father    Hypertension  Father    Heart disease Mother    Skin cancer Mother    Heart attack Mother    Cancer Mother    Hyperlipidemia Mother    Hypertension Mother    Varicose Veins Mother    Peripheral vascular disease Mother        amputation   Sleep apnea Brother    Hyperlipidemia Brother    Hypertension Brother    Colon cancer Other        aunt   Skin cancer Brother    Skin cancer Sister    Cancer Sister    Heart disease Sister  Diabetes Neg Hx    Stroke Neg Hx     ROS: Review of Systems  Constitutional: Positive for weight loss.  Respiratory: Negative for shortness of breath (on exertion).   Cardiovascular: Negative for chest pain and claudication.  Gastrointestinal: Negative for nausea and vomiting.  Musculoskeletal: Negative for myalgias.       Negative muscle weakness  Neurological: Negative for headaches.  Endo/Heme/Allergies:       Negative polyphagia Negative hypoglycemia Negative Heat/Cold Intolerance    PHYSICAL EXAM: Blood pressure 122/70, pulse 80, temperature 97.3 F (36.3 C), temperature source Oral, height 5\' 9"  (1.753 m), weight 209 lb (94.8 kg), SpO2 97 %. Body mass index is 30.86 kg/m. Physical Exam  Constitutional: He is oriented to person, place, and time. He appears well-developed and well-nourished.  Cardiovascular: Normal rate.   Pulmonary/Chest: Effort normal.  Musculoskeletal: Normal range of motion.  Neurological: He is oriented to person, place, and time.  Skin: Skin is warm and dry.  Psychiatric: He has a normal mood and affect. His behavior is normal.  Vitals reviewed.   RECENT LABS AND TESTS: BMET    Component Value Date/Time   NA 142 01/18/2017 0938   K 4.5 01/18/2017 0938   CL 100 01/18/2017 0938   CO2 26 01/18/2017 0938   GLUCOSE 88 01/18/2017 0938   GLUCOSE 107 (H) 12/07/2016 0852   BUN 17 01/18/2017 0938   CREATININE 1.18 01/18/2017 0938   CREATININE 1.08 12/14/2015 1122   CALCIUM 10.3 (H) 01/18/2017 0938    GFRNONAA 60 01/18/2017 0938   GFRAA 69 01/18/2017 0938   Lab Results  Component Value Date   HGBA1C 5.9 (H) 01/18/2017   HGBA1C 6.1 12/07/2016   Lab Results  Component Value Date   INSULIN 16.1 01/18/2017   CBC    Component Value Date/Time   WBC 4.7 01/18/2017 0938   WBC 5.6 12/07/2016 0852   RBC 4.76 01/18/2017 0938   RBC 4.72 12/07/2016 0852   HGB 15.0 01/18/2017 0938   HCT 43.9 01/18/2017 0938   PLT 185.0 12/07/2016 0852   PLT 156 04/07/2010   MCV 92 01/18/2017 0938   MCH 31.5 01/18/2017 0938   MCH 31.1 12/14/2015 1122   MCHC 34.2 01/18/2017 0938   MCHC 34.3 12/07/2016 0852   RDW 13.1 01/18/2017 0938   LYMPHSABS 2.0 01/18/2017 0938   MONOABS 0.4 12/07/2016 0852   EOSABS 0.1 01/18/2017 0938   BASOSABS 0.0 01/18/2017 0938   Iron/TIBC/Ferritin/ %Sat No results found for: IRON, TIBC, FERRITIN, IRONPCTSAT Lipid Panel     Component Value Date/Time   CHOL 134 01/18/2017 0938   TRIG 224 (H) 01/18/2017 0938   TRIG 256 09/14/2009 0000   HDL 38 (L) 01/18/2017 0938   CHOLHDL 3 12/07/2016 0852   VLDL 30.8 12/07/2016 0852   LDLCALC 51 01/18/2017 0938   LDLDIRECT 68.0 04/13/2015 0823   Hepatic Function Panel     Component Value Date/Time   PROT 7.0 01/18/2017 0938   ALBUMIN 4.7 01/18/2017 0938   AST 32 01/18/2017 0938   ALT 27 01/18/2017 0938   ALKPHOS 61 01/18/2017 0938   BILITOT 0.5 01/18/2017 0938      Component Value Date/Time   TSH 4.960 (H) 01/18/2017 0938   TSH 4.36 12/07/2016 0852   TSH 3.86 04/22/2014 1623    ASSESSMENT AND PLAN: Vitamin D deficiency - Plan: VITAMIN D 25 Hydroxy (Vit-D Deficiency, Fractures)  Hyperlipidemia, unspecified hyperlipidemia type - Plan: Lipid Panel With LDL/HDL Ratio  Prediabetes - Plan:  Hemoglobin A1c, Insulin, random  Essential hypertension  Elevated TSH - Plan: TSH, T4, free, T3  Morbid obesity ([place]) - 40.7  PLAN:  Vitamin D Deficiency Philip [name] was informed that low vitamin D levels contributes to fatigue and  are associated with obesity, breast, and colon cancer. He agrees to continue to take OTC Vit D and we will check labs and will follow up for routine testing of vitamin D, at least 2-3 times per year. He was informed of the risk of over-replacement of vitamin D and agrees to not increase his dose unless he discusses this with [place] first. Philip [name] agrees to follow up with our clinic in 2 to 3 weeks.  Pre-Diabetes Philip [name] will continue to work on weight loss, exercise, and decreasing simple carbohydrates in his diet to help decrease the risk of diabetes. We dicussed metformin including benefits and risks. He was informed that eating too many simple carbohydrates or too many calories at one sitting increases the likelihood of GI side effects. We will check labs and Philip [name] agreed to follow up with [place] as directed to monitor his progress.  Hypertension We discussed sodium restriction, working on healthy weight loss, and a regular exercise program as the means to achieve improved blood pressure control. Philip [name] agreed with this plan and agreed to follow up as directed. We will check labs and will continue to monitor his blood pressure as well as his progress with the above lifestyle modifications. He will continue his medications as prescribed and will watch for signs of hypotension as he continues his lifestyle modifications.  Hyperlipidemia Philip [name] was informed of the American Heart Association Guidelines emphasizing intensive lifestyle modifications as the first line treatment for hyperlipidemia. We discussed many lifestyle modifications today in depth, and Philip [name] will continue to work on decreasing saturated fats such as fatty red meat, butter and many fried foods. He will also increase vegetables and lean protein in his diet and continue to work on exercise and weight loss efforts. We will check labs and Philip [name] agrees to continue fenofibrate and lipitor and follow up with our clinic in 2 to 3 weeks.  Elevated  TSH We will check labs and Philip [name] agrees to follow up with our clinic in 2 to 3 weeks.  Obesity Philip [name] is currently in the action stage of change. As such, his goal is to continue with weight loss efforts He has agreed to follow the Category 3 plan Philip [name] has been instructed to work up to a goal of 150 minutes of combined cardio and strengthening exercise per week for weight loss and overall health benefits. We discussed the following Behavioral Modification Strategies today: increasing lean protein intake and increase H2O intake  Philip [name] has agreed to follow up with our clinic in 2 to 3 weeks. He was informed of the importance of frequent follow up visits to maximize his success with intensive lifestyle modifications for his multiple health conditions.  I, Doreene Nest, am acting as transcriptionist for [name], MD  I have reviewed the above documentation for accuracy and completeness, and I agree with the above. -[name], MD   OBESITY BEHAVIORAL INTERVENTION VISIT  Today's visit was # 6 out of 22.  Starting weight: 230 lbs Starting date: 01/18/17 Today's weight : 209 lbs  Today's date: 04/19/2017 Total lbs lost to date: 21 (Patients must lose 7 lbs in the first 6 months to continue with counseling)   ASK: We discussed the diagnosis of obesity with [name] today and [name] agreed  to give [place] permission to discuss obesity behavioral modification therapy today.  ASSESS: Sami has the diagnosis of obesity and his BMI today is 30.9 [place] is in the action stage of change   ADVISE: Brendon was educated on the multiple health risks of obesity as well as the benefit of weight loss to improve his health. He was advised of the need for long term treatment and the importance of lifestyle modifications.  AGREE: Multiple dietary modification options and treatment options were discussed and  Tereso agreed to follow the Category 3 plan We discussed the following Behavioral  Modification Strategies today: increasing lean protein intake and increase H2O intake

## 2017-04-20 LAB — TSH: TSH: 5.11 u[IU]/mL — AB (ref 0.450–4.500)

## 2017-04-20 LAB — LIPID PANEL WITH LDL/HDL RATIO
Cholesterol, Total: 118 mg/dL (ref 100–199)
HDL: 42 mg/dL (ref >39)
LDL Calculated: 60 mg/dL (ref 0–99)
LDl/HDL Ratio: 1.4 ratio (ref 0.0–3.6)
Triglycerides: 81 mg/dL (ref 0–149)
VLDL Cholesterol Cal: 16 mg/dL (ref 5–40)

## 2017-04-20 LAB — HEMOGLOBIN A1C
ESTIMATED AVERAGE GLUCOSE: 117 mg/dL
HEMOGLOBIN A1C: 5.7 % — AB (ref 4.8–5.6)

## 2017-04-20 LAB — T4, FREE: Free T4: 1.1 ng/dL (ref 0.82–1.77)

## 2017-04-20 LAB — INSULIN, RANDOM: INSULIN: 17.3 u[IU]/mL (ref 2.6–24.9)

## 2017-04-20 LAB — T3: T3, Total: 75 ng/dL (ref 71–180)

## 2017-04-20 LAB — VITAMIN D 25 HYDROXY (VIT D DEFICIENCY, FRACTURES): Vit D, 25-Hydroxy: 59.4 ng/mL (ref 30.0–100.0)

## 2017-04-23 NOTE — Progress Notes (Signed)
Follow-up Outpatient Visit Date: 04/24/2017  Primary Care Provider: Colon Branch, MD [address] [place] [address]  Chief Complaint: Follow-up coronary artery disease  HPI:  Philip [name] is a 76 y.o. year-old male with history of coronary artery disease status post CABG in 1999, AAA status post endovascular repair in 2015, paroxysmal atrial fibrillation, hypertension, hyperlipidemia, and CKD stage III, who presents for follow-up of coronary artery disease. I last saw him on 01/09/17, at which time he was concerned about intermittent lethargy. He attributed this to a combination of medication side effects and weight gain. We agreed to obtain a myocardial perfusion stress test, which was improved compared with is prior study. We therefore opted not to make any medication changes.  Today, Philip [name] reports feeling well with the exception of occasional orthostatic lightheadedness. Most recent episode occurred yesterday after sitting up from the couch. He checked his blood pressure and found it to be 109/52. He had similar issues a few years ago and held amlodipine on his own with improvement of the symptoms. He has been trying to stay well-hydrated. He is taking part in a weight management program and is trying to eat well and drink plenty of fluids throughout the day. He has not had any chest pain, shortness of breath, palpitations, orthopnea, PND, or edema. He is trying to exercise some, though this is limited by joint issues and plantar fasciitis.  Philip [name] is concerned about remaining on indefinite anticoagulation with apixaban. He reports being told by Dr. Scot [name] (vascular surgery) that he could be switched aspirin at some point. Philip [name] has not had any bleeding, but is concerned about the cost of apixaban.  --------------------------------------------------------------------------------------------------  Cardiovascular History & Procedures: Cardiovascular  Problems:  Paroxysmal atrial fibrillation  Coronary artery disease status post CABG  Abdominal aortic aneurysm status post endovascular repair  Risk Factors:  Known coronary artery disease, PAD, hypertension, hyperlipidemia, male gender, obesity, and age greater than 2  Cath/PCI:  LHC (05/29/14): LMCA occluded distally. LAD ostial occluded. LCx ostially occluded. RCA proximally occluded. Widely patent LIMA to LAD, SVG to OM, and SVG to distal RCA.  CV Surgery:  Endovascular AAA repair (07/08/14, Dr. Scot [name])  CABG (1999): LIMA to LAD, SVG to OM, and SVG to distal RCA  EP Procedures and Devices:  None  Non-Invasive Evaluation(s):  Exercise MPI (01/12/17): Intermediate risk study with small in size, mild in severity mid anteroseptal defect consistent with scar. Small in size mild severity, mid anterior defect that is reversible and consistent with ischemia in the diagonal territory. LVEF 47%. Overall, study is stable to improved compared with the previous test in 2015.  Pharmacologic MPI (05/16/14): Intermediate risk study with reversible anterior and apical ischemia as well as fixed basal to mid inferior defect. LVEF 48% with basal to mid inferior hypokinesis.  TTE (05/16/14): Normal LV size and wall thickness with LVEF of 55-60%. Mild left atrial enlargement. Normal RV size and function. Mitral annular calcification with mildly thickened leaflets. Aortic sclerosis.  Recent CV Pertinent Labs: Lab Results  Component Value Date   CHOL 118 04/19/2017   HDL 42 04/19/2017   LDLCALC 60 04/19/2017   LDLDIRECT 68.0 04/13/2015   TRIG 81 04/19/2017   TRIG 256 09/14/2009   CHOLHDL 3 12/07/2016   INR 1.18 07/08/2014   K 4.5 01/18/2017   MG 1.8 07/08/2014   BUN 17 01/18/2017   CREATININE 1.18 01/18/2017   CREATININE 1.08 12/14/2015    Past medical  and surgical history were reviewed and updated in EPIC.  Current Meds  Medication Sig  . amLODipine (NORVASC) 10 MG tablet Take 1  tablet (10 mg total) by mouth daily.  Marland Kitchen apixaban (ELIQUIS) 5 MG TABS tablet Take 1 tablet (5 mg total) by mouth 2 (two) times daily.  Marland Kitchen atorvastatin (LIPITOR) 40 MG tablet Take 1 tablet (40 mg total) by mouth at bedtime.  . B Complex-Biotin-FA (B-COMPLEX PO) Take 1 tablet by mouth daily.  . Cholecalciferol (VITAMIN D3) 5000 UNITS CAPS Take 5,000 Units by mouth daily.  Marland Kitchen co-enzyme Q-10 30 MG capsule Take 30 mg by mouth daily.  . fenofibrate (TRICOR) 48 MG tablet Take 1 tablet (48 mg total) by mouth daily.  . hydrocortisone 2.5 % cream Apply 1 application topically as needed.  . magnesium oxide (MAG-OX) 400 MG tablet Take 800 mg by mouth daily.  . metoprolol succinate (TOPROL-XL) 100 MG 24 hr tablet Take 1 tablet (100 mg total) by mouth daily. TAKE WITH OR IMMEDIATELY FOLLOWING A MEAL.  . Multiple Vitamins-Minerals (MULTIVITAMIN WITH MINERALS) tablet Take 1 tablet by mouth daily.  . Omega-3 Fatty Acids (FISH OIL) 1200 MG CAPS Take 1,200 mg by mouth daily.  . polyethylene glycol (MIRALAX / GLYCOLAX) packet Take 17 g by mouth daily.   Current Facility-Administered Medications for the 04/24/17 encounter (Office Visit) with Latisa Belay, Harrell Gave, MD  Medication  . triamcinolone acetonide (KENALOG) 10 MG/ML injection 10 mg    Allergies: Patient has no known allergies.  Social History   Social History  . Marital status: Married    Spouse name: N/A  . Number of children: 3  . Years of education: 12   Occupational History  . retired, Actor    Social History Main Topics  . Smoking status: Former Research scientist (life sciences)  . Smokeless tobacco: Never Used     Comment: quit smoking in 1979  . Alcohol use Yes     Comment: occasionally - once per week  . Drug use: No  . Sexual activity: Not Currently   Other Topics Concern  . Not on file   Social History Narrative   Lives w/ wife in a 2 story home.  Has one son and 2 stepchildren.  Retired for [employer].     Education: high school.       Family History   Problem Relation Age of Onset  . Prostate cancer Father 17  . Heart disease Father   . Skin cancer Father   . Heart attack Father   . Cancer Father   . Deep vein thrombosis Father   . Hyperlipidemia Father   . Hypertension Father   . Heart disease Mother   . Skin cancer Mother   . Heart attack Mother   . Cancer Mother   . Hyperlipidemia Mother   . Hypertension Mother   . Varicose Veins Mother   . Peripheral vascular disease Mother        amputation  . Sleep apnea Brother   . Hyperlipidemia Brother   . Hypertension Brother   . Colon cancer Other        aunt  . Skin cancer Brother   . Skin cancer Sister   . Cancer Sister   . Heart disease Sister   . Diabetes Neg Hx   . Stroke Neg Hx     Review of Systems: A 12-system review of systems was performed and was negative except as noted in the HPI.  --------------------------------------------------------------------------------------------------  Physical Exam: BP (!) 142/70 (  BP Location: Left Arm, Patient Position: Sitting, Cuff Size: Normal)   Pulse 60 Comment: slightly irregular  Ht 5\' 9"  (1.753 m)   Wt 217 lb 6 oz (98.6 kg)   BMI 32.10 kg/m  Position Blood pressure (mmHg) Heart rate (bpm)  Lying 142/74  56   Sitting 131/69  55   Standing 125/69  58   Standing (3 minutes) 130/70  54    General:  Obese obese man, seated comfortably in the exam room. HEENT: No conjunctival pallor or scleral icterus. Moist mucous membranes.  OP clear. Neck: Supple without lymphadenopathy, thyromegaly, JVD, or HJR. No carotid bruit. Lungs: Normal work of breathing. Clear to auscultation bilaterally without wheezes or crackles. Heart: Irregular and bradycardic rhythm without murmurs, rubs, or gallops. Non-displaced PMI. Abd: Bowel sounds present. Soft, NT/ND without hepatosplenomegaly Ext: No lower extremity edema. Radial, PT, and DP pulses are 2+ bilaterally. Skin: Warm and dry without rash.  EKG:  Sinus bradycardia (heart rate 53  bpm) with first-degree AV block, occasional PACs, and borderline LVH.  Lab Results  Component Value Date   WBC 4.7 01/18/2017   HGB 15.0 01/18/2017   HCT 43.9 01/18/2017   MCV 92 01/18/2017   PLT 185.0 12/07/2016    Lab Results  Component Value Date   NA 142 01/18/2017   K 4.5 01/18/2017   CL 100 01/18/2017   CO2 26 01/18/2017   BUN 17 01/18/2017   CREATININE 1.18 01/18/2017   GLUCOSE 88 01/18/2017   ALT 27 01/18/2017    Lab Results  Component Value Date   CHOL 118 04/19/2017   HDL 42 04/19/2017   LDLCALC 60 04/19/2017   LDLDIRECT 68.0 04/13/2015   TRIG 81 04/19/2017   CHOLHDL 3 12/07/2016    --------------------------------------------------------------------------------------------------  ASSESSMENT AND PLAN: Paroxysmal atrial fibrillation Philip [name] has not had any symptoms of recurrent atrial fibrillation. This was first noted by EKG and 04/2014. EKG in March of this year at the time of enrollment in the healthy weight and wellness program revealed atrial fibrillation again. In light of these findings and Philip [name] CHADSVASC score of 4, I recommend indefinite anticoagulation. He is reluctantly agreeable with this. Given his low resting heart rate today, we will decrease metoprolol succinate to 50 mg daily.  Coronary artery disease without angina No recurrent chest pain. Overall, Philip [name] energy seems to be doing well. His myocardial perfusion stress test after our last visit was abnormal but somewhat improved compared with 2015. Given that his grafts were all patent at that time, we have agreed to forego additional testing and to continue with medical therapy.  Hypertension Blood pressure is mildly elevated today on initial check but demonstrates borderline orthostatic drop. As above, we will decrease metoprolol succinate to 50 mg daily. I have asked Philip [name] to continue to monitor his blood pressure at home and to alert [place] if it increases significantly or if he  continues to have lightheadedness.  Hyperlipidemia Continue current doses of fenofibrate and atorvastatin. Most recent LDL 5 days ago was at goal.  AAA No new abdominal symptoms. Philip [name] should continue to follow with Dr. Scot [name], as previously arranged.  Follow-up: Return to clinic in 3 months.  Philip Bush, MD 04/24/2017 9:42 AM

## 2017-04-24 ENCOUNTER — Encounter: Payer: Self-pay | Admitting: Internal Medicine

## 2017-04-24 ENCOUNTER — Ambulatory Visit (INDEPENDENT_AMBULATORY_CARE_PROVIDER_SITE_OTHER): Payer: Medicare HMO | Admitting: Internal Medicine

## 2017-04-24 VITALS — BP 142/70 | HR 60 | Ht 69.0 in | Wt 217.4 lb

## 2017-04-24 DIAGNOSIS — I714 Abdominal aortic aneurysm, without rupture, unspecified: Secondary | ICD-10-CM

## 2017-04-24 DIAGNOSIS — I10 Essential (primary) hypertension: Secondary | ICD-10-CM

## 2017-04-24 DIAGNOSIS — I480 Paroxysmal atrial fibrillation: Secondary | ICD-10-CM

## 2017-04-24 DIAGNOSIS — E782 Mixed hyperlipidemia: Secondary | ICD-10-CM | POA: Diagnosis not present

## 2017-04-24 DIAGNOSIS — I2510 Atherosclerotic heart disease of native coronary artery without angina pectoris: Secondary | ICD-10-CM

## 2017-04-24 MED ORDER — METOPROLOL SUCCINATE ER 50 MG PO TB24: 50.0000 mg | ORAL_TABLET | Freq: Every day | ORAL | 1 refills | Status: DC

## 2017-04-24 NOTE — Patient Instructions (Addendum)
Medication Instructions:  Decrease metoprolol succinate (Toprol XL) to 50 mg daily. You can take 1/ 2 of your 100 mg tablet and use your current supply.  Labwork: none  Testing/Procedures: none  Follow-Up: Your physician recommends that you schedule a follow-up appointment in: 3 months with Dr End.        If you need a refill on your cardiac medications before your next appointment, please call your pharmacy.

## 2017-04-28 ENCOUNTER — Encounter: Payer: Self-pay | Admitting: Podiatry

## 2017-04-28 ENCOUNTER — Ambulatory Visit (INDEPENDENT_AMBULATORY_CARE_PROVIDER_SITE_OTHER): Payer: Medicare HMO | Admitting: Podiatry

## 2017-04-28 DIAGNOSIS — M722 Plantar fascial fibromatosis: Secondary | ICD-10-CM | POA: Diagnosis not present

## 2017-05-03 DIAGNOSIS — G4733 Obstructive sleep apnea (adult) (pediatric): Secondary | ICD-10-CM | POA: Diagnosis not present

## 2017-05-08 ENCOUNTER — Ambulatory Visit (INDEPENDENT_AMBULATORY_CARE_PROVIDER_SITE_OTHER): Payer: Medicare HMO | Admitting: Family Medicine

## 2017-05-08 ENCOUNTER — Telehealth: Payer: Self-pay | Admitting: Cardiology

## 2017-05-08 VITALS — BP 147/68 | HR 60 | Temp 98.3°F | Ht 69.0 in | Wt 209.0 lb

## 2017-05-08 DIAGNOSIS — R7303 Prediabetes: Secondary | ICD-10-CM

## 2017-05-08 DIAGNOSIS — E66811 Obesity, class 1: Secondary | ICD-10-CM | POA: Insufficient documentation

## 2017-05-08 DIAGNOSIS — Z6830 Body mass index (BMI) 30.0-30.9, adult: Secondary | ICD-10-CM | POA: Diagnosis not present

## 2017-05-08 DIAGNOSIS — Z9189 Other specified personal risk factors, not elsewhere classified: Secondary | ICD-10-CM

## 2017-05-08 DIAGNOSIS — E669 Obesity, unspecified: Secondary | ICD-10-CM

## 2017-05-08 DIAGNOSIS — I10 Essential (primary) hypertension: Secondary | ICD-10-CM

## 2017-05-08 NOTE — Progress Notes (Signed)
Office: [phone]  /  Fax: [phone]   HPI:   Chief Complaint: OBESITY Philip [name] is here to discuss his progress with his obesity treatment plan. He is on the  follow the Category 3 plan and is following his eating plan approximately 90 % of the time. He states he is walking for 30 minutes 3 times per week. Philip [name] maintained weight while on vacation. He would like to add variety to his meals. His weight is 209 lb (94.8 kg) today and has maintained weight over a period of 3 weeks since his last visit. He has lost 21 lbs since starting treatment with [place].  Hypertension Philip [name] is a 76 y.o. male with hypertension. His blood pressure is slightly elevated at 147/68, he states he was running late to the appointment.  Philip [name] denies chest pain, headache or shortness of breath on exertion. He is working weight loss to help control his blood pressure with the goal of decreasing his risk of heart attack and stroke. Philip [name] blood pressure is not currently controlled.  Pre-Diabetes Philip [name] has a diagnosis of pre-diabetes based on his elevated Hgb A1c, improved at 5.7 and was informed this puts him at greater risk of developing diabetes. He is not taking metformin currently and continues to work on diet and exercise to decrease risk of diabetes. He denies nausea or hypoglycemia.  At risk for diabetes Philip [name] is at higher than average risk for developing diabetes due to his obesity and pre-diabetes. He currently denies polyuria or polydipsia.   ALLERGIES: No Known Allergies  MEDICATIONS: Current Outpatient Prescriptions on File Prior to Visit  Medication Sig Dispense Refill   amLODipine (NORVASC) 10 MG tablet Take 1 tablet (10 mg total) by mouth daily. 90 tablet 1   apixaban (ELIQUIS) 5 MG TABS tablet Take 1 tablet (5 mg total) by mouth 2 (two) times daily. 180 tablet 1   atorvastatin (LIPITOR) 40 MG tablet Take 1 tablet (40 mg total) by mouth at bedtime. 90 tablet 1   B  Complex-Biotin-FA (B-COMPLEX PO) Take 1 tablet by mouth daily.     Cholecalciferol (VITAMIN D3) 5000 UNITS CAPS Take 5,000 Units by mouth daily.     co-enzyme Q-10 30 MG capsule Take 30 mg by mouth daily.     fenofibrate (TRICOR) 48 MG tablet Take 1 tablet (48 mg total) by mouth daily. 90 tablet 1   hydrocortisone 2.5 % cream Apply 1 application topically as needed.     magnesium oxide (MAG-OX) 400 MG tablet Take 800 mg by mouth daily.     metoprolol succinate (TOPROL-XL) 50 MG 24 hr tablet Take 1 tablet (50 mg total) by mouth daily. Take with or immediately following a meal. 90 tablet 1   Multiple Vitamins-Minerals (MULTIVITAMIN WITH MINERALS) tablet Take 1 tablet by mouth daily.     Omega-3 Fatty Acids (FISH OIL) 1200 MG CAPS Take 1,200 mg by mouth daily.     polyethylene glycol (MIRALAX / GLYCOLAX) packet Take 17 g by mouth daily.     lisinopril (PRINIVIL,ZESTRIL) 10 MG tablet Take 1 tablet (10 mg total) by mouth daily. 90 tablet 1   Current Facility-Administered Medications on File Prior to Visit  Medication Dose Route Frequency Provider Last Rate Last Dose   triamcinolone acetonide (KENALOG) 10 MG/ML injection 10 mg  10 mg Other Once Trula Slade, DPM        PAST MEDICAL HISTORY: Past Medical History:  Diagnosis Date   AAA (abdominal aortic aneurysm) ([place])  Atrial fibrillation ([place])    CAD (coronary artery disease)    had a MI , s/p CABG   Cataracts, bilateral    immature   CHF (congestive heart failure) ([place])    Dysrhythmia    paroximal a fib   GERD (gastroesophageal reflux disease)    takes Omeprazole daily   History of colon polyps    History of kidney stones    History of shingles    Hyperlipidemia    Hypertension    takes Amlodipine,Metoprolol,and Lisinopril daily   Joint pain    Joint swelling    Myocardial infarction [facility])    several with last one being 2001(but never knew about any except 2001)   OSA (obstructive sleep apnea)      started CPAP 12/09   Peripheral vascular disease ([place])    Pneumonia    as a child   Skin cancer    several , one of them was melanoma (sees derm routinely)   Tingling    feet occasionally   Tinnitus     PAST SURGICAL HISTORY: Past Surgical History:  Procedure Laterality Date   ABDOMINAL AORTAGRAM N/A 06/16/2014   Procedure: ABDOMINAL Maxcine Ham;  Surgeon: Angelia Mould, MD;  Location: [facility] CATH LAB;  Service: Cardiovascular;  Laterality: N/A;   ABDOMINAL AORTIC ENDOVASCULAR STENT GRAFT N/A 07/08/2014   Procedure: ABDOMINAL AORTIC ENDOVASCULAR STENT GRAFT/ GORE;  Surgeon: Angelia Mould, MD;  Location: [place];  Service: Vascular;  Laterality: N/A;   CARDIAC CATHETERIZATION  2015   COLONOSCOPY     CORONARY ARTERY BYPASS GRAFT  1999   x 3 vessels   LEFT HEART CATHETERIZATION WITH CORONARY/GRAFT ANGIOGRAM N/A 05/29/2014   Procedure: LEFT HEART CATHETERIZATION WITH [name];  Surgeon: [name], MD;  Location: [facility] CATH LAB;  Service: Cardiovascular;  Laterality: N/A;   PENILE PROSTHESIS IMPLANT  08/2005   RHINOPLASTY  1975    SOCIAL HISTORY: Social History  Substance Use Topics   Smoking status: Former Smoker   Smokeless tobacco: Never Used     Comment: quit smoking in 1979   Alcohol use Yes     Comment: occasionally - once per week    FAMILY HISTORY: Family History  Problem Relation Age of Onset   Prostate cancer Father 4   Heart disease Father    Skin cancer Father    Heart attack Father    Cancer Father    Deep vein thrombosis Father    Hyperlipidemia Father    Hypertension Father    Heart disease Mother    Skin cancer Mother    Heart attack Mother    Cancer Mother    Hyperlipidemia Mother    Hypertension Mother    Varicose Veins Mother    Peripheral vascular disease Mother        amputation   Sleep apnea Brother    Hyperlipidemia Brother    Hypertension Brother    Colon cancer Other         aunt   Skin cancer Brother    Skin cancer Sister    Cancer Sister    Heart disease Sister    Diabetes Neg Hx    Stroke Neg Hx     ROS: Review of Systems  Constitutional: Negative for weight loss.  Respiratory: Negative for shortness of breath (on exertion).   Cardiovascular: Negative for chest pain.  Gastrointestinal: Negative for nausea.  Genitourinary: Negative for frequency.  Neurological: Negative for headaches.  Endo/Heme/Allergies: Negative for polydipsia.  Negative hypoglycemia     PHYSICAL EXAM: Blood pressure (!) 147/68, pulse 60, temperature 98.3 F (36.8 C), temperature source Oral, height 5\' 9"  (1.753 m), weight 209 lb (94.8 kg), SpO2 97 %. Body mass index is 30.86 kg/m. Physical Exam  Constitutional: He is oriented to person, place, and time. He appears well-developed and well-nourished.  Cardiovascular: Normal rate.   Pulmonary/Chest: Effort normal.  Musculoskeletal: Normal range of motion.  Neurological: He is oriented to person, place, and time.  Skin: Skin is warm and dry.  Psychiatric: He has a normal mood and affect. His behavior is normal.  Vitals reviewed.   RECENT LABS AND TESTS: BMET    Component Value Date/Time   NA 142 01/18/2017 0938   K 4.5 01/18/2017 0938   CL 100 01/18/2017 0938   CO2 26 01/18/2017 0938   GLUCOSE 88 01/18/2017 0938   GLUCOSE 107 (H) 12/07/2016 0852   BUN 17 01/18/2017 0938   CREATININE 1.18 01/18/2017 0938   CREATININE 1.08 12/14/2015 1122   CALCIUM 10.3 (H) 01/18/2017 0938   GFRNONAA 60 01/18/2017 0938   GFRAA 69 01/18/2017 0938   Lab Results  Component Value Date   HGBA1C 5.7 (H) 04/19/2017   HGBA1C 5.9 (H) 01/18/2017   HGBA1C 6.1 12/07/2016   Lab Results  Component Value Date   INSULIN 17.3 04/19/2017   INSULIN 16.1 01/18/2017   CBC    Component Value Date/Time   WBC 4.7 01/18/2017 0938   WBC 5.6 12/07/2016 0852   RBC 4.76 01/18/2017 0938   RBC 4.72 12/07/2016 0852   HGB 15.0  01/18/2017 0938   HCT 43.9 01/18/2017 0938   PLT 185.0 12/07/2016 0852   PLT 156 04/07/2010   MCV 92 01/18/2017 0938   MCH 31.5 01/18/2017 0938   MCH 31.1 12/14/2015 1122   MCHC 34.2 01/18/2017 0938   MCHC 34.3 12/07/2016 0852   RDW 13.1 01/18/2017 0938   LYMPHSABS 2.0 01/18/2017 0938   MONOABS 0.4 12/07/2016 0852   EOSABS 0.1 01/18/2017 0938   BASOSABS 0.0 01/18/2017 0938   Iron/TIBC/Ferritin/ %Sat No results found for: IRON, TIBC, FERRITIN, IRONPCTSAT Lipid Panel     Component Value Date/Time   CHOL 118 04/19/2017 0818   TRIG 81 04/19/2017 0818   TRIG 256 09/14/2009 0000   HDL 42 04/19/2017 0818   CHOLHDL 3 12/07/2016 0852   VLDL 30.8 12/07/2016 0852   LDLCALC 60 04/19/2017 0818   LDLDIRECT 68.0 04/13/2015 0823   Hepatic Function Panel     Component Value Date/Time   PROT 7.0 01/18/2017 0938   ALBUMIN 4.7 01/18/2017 0938   AST 32 01/18/2017 0938   ALT 27 01/18/2017 0938   ALKPHOS 61 01/18/2017 0938   BILITOT 0.5 01/18/2017 0938      Component Value Date/Time   TSH 5.110 (H) 04/19/2017 0818   TSH 4.960 (H) 01/18/2017 0938   TSH 4.36 12/07/2016 0852    ASSESSMENT AND PLAN: Prediabetes  Essential hypertension  At risk for diabetes mellitus  Class 1 obesity with serious comorbidity and body mass index (BMI) of 30.0 to 30.9 in adult, unspecified obesity type  PLAN:   Hypertension We discussed sodium restriction, working on healthy weight loss, and a regular exercise program as the means to achieve improved blood pressure control. Ace agreed with this plan and agreed to follow up as directed. We will continue to monitor his blood pressure as well as his progress with the above lifestyle modifications. He will continue his medications as prescribed  and will watch for signs of hypotension as he continues his lifestyle modifications.  Pre-Diabetes Olando will continue to work on weight loss, exercise, and decreasing simple carbohydrates in his diet to help  decrease the risk of diabetes. He was informed that eating too many simple carbohydrates or too many calories at one sitting increases the likelihood of GI side effects. Ryon agreed to follow up with [place] as directed to monitor his progress.  Diabetes risk counselling Colen was given extended (at least 15 minutes) diabetes prevention counseling today. He is 76 y.o. male and has risk factors for diabetes including obesity and pre-diabetes. We discussed intensive lifestyle modifications today with an emphasis on weight loss as well as increasing exercise and decreasing simple carbohydrates in his diet.  Obesity Kore is currently in the action stage of change. As such, his goal is to continue with weight loss efforts He has agreed to keep a food journal with 500 to 600 calories and 40+ grams of protein  and follow the Category 3 plan Arlene has been instructed to work up to a goal of 150 minutes of combined cardio and strengthening exercise per week for weight loss and overall health benefits. We discussed the following Behavioral Modification Strategies today: increasing lean protein intake and keeping healthy foods in the home  Haedyn has agreed to follow up with our clinic in 2 weeks. He was informed of the importance of frequent follow up visits to maximize his success with intensive lifestyle modifications for his multiple health conditions.  I, Doreene Nest, am acting as transcriptionist for [name], MD  I have reviewed the above documentation for accuracy and completeness, and I agree with the above. -[name], MD   OBESITY BEHAVIORAL INTERVENTION VISIT  Today's visit was # 7 out of 22.  Starting weight: 230 lbs Starting date: 01/18/17 Today's weight : 209 lbs Today's date: 05/08/2017 Total lbs lost to date: 21 (Patients must lose 7 lbs in the first 6 months to continue with counseling)   ASK: We discussed the diagnosis of obesity with [name] today and Trevan  agreed to give [place] permission to discuss obesity behavioral modification therapy today.  ASSESS: Keiffer has the diagnosis of obesity and his BMI today is 30.9 Sevin is in the action stage of change   ADVISE: Devaunte was educated on the multiple health risks of obesity as well as the benefit of weight loss to improve his health. He was advised of the need for long term treatment and the importance of lifestyle modifications.  AGREE: Multiple dietary modification options and treatment options were discussed and  Obe agreed to keep a food journal with 500 to 600 calories and 40+ grams of protein  and follow the Category 3 plan We discussed the following Behavioral Modification Strategies today: increasing lean protein intake and keeping healthy foods in the home

## 2017-05-08 NOTE — Progress Notes (Signed)
Subjective: Philip [name] presents to the office today for follow-up evaluation of left heel pain. They state that they are doing about 50% better. They have been icing, stretching, try to wear supportive shoe as much as possible. Denies any numbness or tingling. Gets occasional sharp pain to the bottom of the heel. Pain does not wake him up at night. No other complaints at this time. No acute changes since last appointment. They deny any systemic complaints such as fevers, chills, nausea, vomiting.  Objective: General: AAO x3, NAD  Dermatological: Skin is warm, dry and supple bilateral. Nails x 10 are well manicured; remaining integument appears unremarkable at this time. There are no open sores, no preulcerative lesions, no rash or signs of infection present.  Vascular: Dorsalis Pedis artery and Posterior Tibial artery pedal pulses are 2/4 bilateral with immedate capillary fill time. Pedal hair growth present. There is no pain with calf compression, swelling, warmth, erythema.   Neruologic: Grossly intact via light touch bilateral. Vibratory intact via tuning fork bilateral. Protective threshold with Semmes Wienstein monofilament intact to all pedal sites bilateral.   Musculoskeletal: There is improved but continued tenderness palpation along the plantar medial tubercle of the calcaneus at the insertion of the plantar fascia on the leftfoot. There is no pain along the course of the plantar fascia within the arch of the foot. Plantar fascia appears to be intact bilaterally. There is no pain with lateral compression of the calcaneus and there is no pain with vibratory sensation. There is no pain along the course or insertion of the Achilles tendon. There are no other areas of tenderness to bilateral lower extremities. No gross boney pedal deformities bilateral. No pain, crepitus, or limitation noted with foot and ankle range of motion bilateral. Muscular strength 5/5 in all groups tested  bilateral.  Gait: Unassisted, Nonantalgic.   Assessment: Presents for follow-up evaluation for heel pain, likely plantar fasciitis   Plan: -Treatment options discussed including all alternatives, risks, and complications -Patient elects to proceed with steroid injection into the left heel. Under sterile skin preparation, a total of 2.5cc of kenalog 10, 0.5% Marcaine plain, and 2% lidocaine plain were infiltrated into the symptomatic area without complication. A band-aid was applied. Patient tolerated the injection well without complication. Post-injection care with discussed with the patient. Discussed with the patient to ice the area over the next couple of days to help prevent a steroid flare.  -Plantar fascial brace -Ice and stretching exercises on a daily basis. -Continue supportive shoe gear. -Follow-up in 4 weeks or sooner if any problems arise. In the meantime, encouraged to call the office with any questions, concerns, change in symptoms.   Celesta Gentile, DPM

## 2017-05-08 NOTE — Telephone Encounter (Deleted)
error 

## 2017-05-11 ENCOUNTER — Encounter: Payer: Self-pay | Admitting: Internal Medicine

## 2017-05-15 DIAGNOSIS — L570 Actinic keratosis: Secondary | ICD-10-CM | POA: Diagnosis not present

## 2017-05-15 DIAGNOSIS — L578 Other skin changes due to chronic exposure to nonionizing radiation: Secondary | ICD-10-CM | POA: Diagnosis not present

## 2017-05-17 ENCOUNTER — Encounter: Payer: Self-pay | Admitting: Internal Medicine

## 2017-05-17 ENCOUNTER — Ambulatory Visit (INDEPENDENT_AMBULATORY_CARE_PROVIDER_SITE_OTHER): Payer: Medicare HMO | Admitting: Internal Medicine

## 2017-05-17 VITALS — BP 106/66 | HR 67 | Temp 98.0°F | Resp 14 | Ht 69.0 in | Wt 215.2 lb

## 2017-05-17 DIAGNOSIS — I10 Essential (primary) hypertension: Secondary | ICD-10-CM | POA: Diagnosis not present

## 2017-05-17 DIAGNOSIS — R0609 Other forms of dyspnea: Secondary | ICD-10-CM

## 2017-05-17 DIAGNOSIS — G609 Hereditary and idiopathic neuropathy, unspecified: Secondary | ICD-10-CM | POA: Diagnosis not present

## 2017-05-17 DIAGNOSIS — R7303 Prediabetes: Secondary | ICD-10-CM | POA: Diagnosis not present

## 2017-05-17 DIAGNOSIS — I2510 Atherosclerotic heart disease of native coronary artery without angina pectoris: Secondary | ICD-10-CM

## 2017-05-17 DIAGNOSIS — N529 Male erectile dysfunction, unspecified: Secondary | ICD-10-CM | POA: Diagnosis not present

## 2017-05-17 DIAGNOSIS — R5383 Other fatigue: Secondary | ICD-10-CM

## 2017-05-17 NOTE — Patient Instructions (Signed)
  GO TO THE FRONT DESK Schedule your next appointment for a  checkup in 5 months  Decrease amlodipine 10 mg to half tablet a day.   Check the  blood pressure 2 or 3 times a    week  Be sure your blood pressure is between 110/65 and  145/85. If it is consistently higher or lower, let me know  Tylenol  500 mg OTC 2 tabs a day every 8 hours as needed for pain

## 2017-05-17 NOTE — Progress Notes (Signed)
Subjective:    Patient ID: Philip [name], male    DOB: Jun 20, 1941, 76 y.o.   MRN: [id number]  DOS:  05/17/2017   Type of visit - description : f/u, Several concerns Interval history: Weight loss: Patient went to the bariatric office, has changed his habits and has lost some weight. HTN: BP is getting the low side, no more than 117/70. Yesterday he felt lightheaded and "terrible". Check his BP was around 70/60. Was seen by cardiology, note reviewed, beta blockers dose decrease to half due to  orthostatic lightheadedness Complaint of pain located at the lateral aspect of left hip, increased by walking, decreased by rest. No claudication. Neuropathy: Note from neurology reviewed. ED: Note from urology review    Wt Readings from Last 3 Encounters:  05/17/17 215 lb 4 oz (97.6 kg)  05/08/17 209 lb (94.8 kg)  04/24/17 217 lb 6 oz (98.6 kg)     Review of Systems Denies chest pain or palpitations. Had diarrhea yesterday one time but denies persistent problems. No vomiting. No headaches. He remains active playing golf.  Past Medical History:  Diagnosis Date  . AAA (abdominal aortic aneurysm) ([place])   . Atrial fibrillation ([place])   . CAD (coronary artery disease)    had a MI , s/p CABG  . Cataracts, bilateral    immature  . CHF (congestive heart failure) ([place])   . Dysrhythmia    paroximal a fib  . GERD (gastroesophageal reflux disease)    takes Omeprazole daily  . History of colon polyps   . History of kidney stones   . History of shingles   . Hyperlipidemia   . Hypertension    takes Amlodipine,Metoprolol,and Lisinopril daily  . Joint pain   . Joint swelling   . Myocardial infarction [facility])    several with last one being 2001(but never knew about any except 2001)  . OSA (obstructive sleep apnea)    started CPAP 12/09  . Peripheral vascular disease ([place])   . Pneumonia    as a child  . Skin cancer    several , one of them was melanoma (sees derm routinely)  . Tingling    feet occasionally  . Tinnitus     Past Surgical History:  Procedure Laterality Date  . ABDOMINAL AORTAGRAM N/A 06/16/2014   Procedure: ABDOMINAL Maxcine Ham;  Surgeon: Angelia Mould, MD;  Location: [facility] CATH LAB;  Service: Cardiovascular;  Laterality: N/A;  . ABDOMINAL AORTIC ENDOVASCULAR STENT GRAFT N/A 07/08/2014   Procedure: ABDOMINAL AORTIC ENDOVASCULAR STENT GRAFT/ GORE;  Surgeon: Angelia Mould, MD;  Location: [place];  Service: Vascular;  Laterality: N/A;  . CARDIAC CATHETERIZATION  2015  . COLONOSCOPY    . CORONARY ARTERY BYPASS GRAFT  1999   x 3 vessels  . LEFT HEART CATHETERIZATION WITH CORONARY/GRAFT ANGIOGRAM N/A 05/29/2014   Procedure: LEFT HEART CATHETERIZATION WITH [name];  Surgeon: [name], MD;  Location[facility] C[facility] LAB;  Service: Cardiovascular;  Laterality: N/A;  . PENILE PROSTHESIS IMPLANT  08/2005  . RHINOPLASTY  1975    Social History   Social History  . Marital status: Married    Spouse name: N/A  . Number of children: 3  . Years of education: 12   Occupational History  . retired, Actor    Social History Main Topics  . Smoking status: Former Research scientist (life sciences)  . Smokeless tobacco: Never Used     Comment: quit smoking in 1979  . Alcohol use Yes  Comment: occasionally - once per week  . Drug use: No  . Sexual activity: Not Currently   Other Topics Concern  . Not on file   Social History Narrative   Lives w/ wife in a 2 story home.  Has one son and 2 stepchildren.  Retired for [employer].     Education: high school.         Allergies as of 05/17/2017   No Known Allergies     Medication List       Accurate as of 05/17/17  9:06 AM. Always use your most recent med list.          amLODipine 10 MG tablet Commonly known as:  NORVASC Take 1 tablet (10 mg total) by mouth daily.   apixaban 5 MG Tabs tablet Commonly known as:  ELIQUIS Take 1 tablet (5 mg total) by mouth 2 (two) times daily.   atorvastatin 40 MG  tablet Commonly known as:  LIPITOR Take 1 tablet (40 mg total) by mouth at bedtime.   B-COMPLEX PO Take 1 tablet by mouth daily.   co-enzyme Q-10 30 MG capsule Take 30 mg by mouth daily.   fenofibrate 48 MG tablet Commonly known as:  TRICOR Take 1 tablet (48 mg total) by mouth daily.   Fish Oil 1200 MG Caps Take 1,200 mg by mouth daily.   hydrocortisone 2.5 % cream Apply 1 application topically as needed.   lisinopril 10 MG tablet Commonly known as:  PRINIVIL,ZESTRIL Take 1 tablet (10 mg total) by mouth daily.   magnesium oxide 400 MG tablet Commonly known as:  MAG-OX Take 800 mg by mouth daily.   metoprolol succinate 50 MG 24 hr tablet Commonly known as:  TOPROL-XL Take 1 tablet (50 mg total) by mouth daily. Take with or immediately following a meal.   multivitamin with minerals tablet Take 1 tablet by mouth daily.   polyethylene glycol packet Commonly known as:  MIRALAX / GLYCOLAX Take 17 g by mouth daily.   Vitamin D3 5000 units Caps Take 5,000 Units by mouth daily.          Objective:   Physical Exam BP 109/66 (BP Location: Left Arm, Patient Position: Sitting, Cuff Size: Normal)   Pulse 67   Temp 98 F (36.7 C) (Oral)   Resp 14   Ht 5\' 9"  (1.753 m)   Wt 215 lb 4 oz (97.6 kg)   SpO2 99%   BMI 31.79 kg/m  General:   Well developed, well nourished . NAD.  HEENT:  Normocephalic . Face symmetric, atraumatic Lungs:  CTA B Normal respiratory effort, no intercostal retractions, no accessory muscle use. Heart: RRR,  no murmur.  No pretibial edema bilaterally  Skin: Not pale. Not jaundice MSK: Hip rotation bilaterally essentially normal with minimal pain. No pain in the trochanteric bursa. Neurologic:  alert & oriented X3.  Speech normal, gait appropriate for age and unassisted Psych--  Cognition and judgment appear intact.  Cooperative with normal attention span and concentration.  Behavior appropriate. No anxious or depressed appearing.       Assessment & Plan:   Assessment Hyperglycemia: A1c 6.1  12/2016 Neuropathy : saw neuro 5-18, likely idiopathic, declined NCS; also UE entrapment neuropathy likely HTN Hyperlipidemia Obesity- 1st visit [name] 01-18-17 CV: ---CAD >>>  MI, CABG   ---CHF ---Peripheral vascular disease ---AAA - see procedures , [name], next visit due 01-2017 --- A. Fib, paroxysmal  GERD  Chronic constipation OSA- Cpap (Dr [name]) ED- penile  implant H/o skin cancer, Dr. [name] H/o Kidney stones 2016  PLAN:  Hyperglycemia: Working diligently on his lifestyle, has lost some weight. Neuropathy: Saw neurology 02-2017, DX idiopathic neuropathy, declined NCS. HTN: Low blood pressure yesterday, today blood pressure is also low. BP symmetric in both arms. Metoprolol already decrease from 100 mg to 50 mg by cards last month (d/t lightheadedness). Recommend to decrease amlodipine to 5 mg daily and monitor BPs. ED: Since the last visit, saw urology, no treatment  Options for his sx except possibly seeing a sexual therapist. 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 Recommend Tylenol, avoid NSAIDs. Considering tramadol. Ortho referral an option. RTC 5 months     F2F> 25

## 2017-05-17 NOTE — Progress Notes (Signed)
Pre visit review using our clinic review tool, if applicable. No additional management support is needed unless otherwise documented below in the visit note. 

## 2017-05-18 NOTE — Assessment & Plan Note (Signed)
Hyperglycemia: Working diligently on his lifestyle, has lost some weight. Neuropathy: Saw neurology 02-2017, DX idiopathic neuropathy, declined NCS. HTN: Low blood pressure yesterday, today blood pressure is also low. BP symmetric in both arms. Metoprolol already decrease from 100 mg to 50 mg by cards last month (d/t lightheadedness). Recommend to decrease amlodipine to 5 mg daily and monitor BPs. ED: Since the last visit, saw urology, no treatment  Options for his sx except possibly seeing a sexual therapist. 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 Recommend Tylenol, avoid NSAIDs. Considering tramadol. Ortho referral an option. RTC 5 months

## 2017-05-26 ENCOUNTER — Ambulatory Visit (INDEPENDENT_AMBULATORY_CARE_PROVIDER_SITE_OTHER): Payer: Medicare HMO | Admitting: Podiatry

## 2017-05-26 ENCOUNTER — Encounter: Payer: Self-pay | Admitting: Podiatry

## 2017-05-26 VITALS — BP 154/72 | HR 71 | Resp 16

## 2017-05-26 DIAGNOSIS — M722 Plantar fascial fibromatosis: Secondary | ICD-10-CM

## 2017-05-28 NOTE — Progress Notes (Signed)
Subjective: Philip [name] presents to the office today for follow-up evaluation of left heel pain. They state that they are doing much better. He states "I am tickled it feels so much better". She states his return to regular activities without any pain. Even clinical history without any pain. They have been icing, stretching, try to wear supportive shoe as much as possible. No other complaints at this time. No acute changes since last appointment. They deny any systemic complaints such as fevers, chills, nausea, vomiting.  Objective: General: AAO x3, NAD  Dermatological: Skin is warm, dry and supple bilateral. Nails x 10 are well manicured; remaining integument appears unremarkable at this time. There are no open sores, no preulcerative lesions, no rash or signs of infection present.  Vascular: Dorsalis Pedis artery and Posterior Tibial artery pedal pulses are 2/4 bilateral with immedate capillary fill time. Pedal hair growth present. There is no pain with calf compression, swelling, warmth, erythema.   Neruologic: Grossly intact via light touch bilateral. Vibratory intact via tuning fork bilateral. Protective threshold with Semmes Wienstein monofilament intact to all pedal sites bilateral.   Musculoskeletal: There is no tenderness palpation along the plantar medial tubercle of the calcaneus at the insertion of the plantar fascia on the left foot. There is no pain along the course of the plantar fascia within the arch of the foot. Plantar fascia appears to be intact bilaterally. There is no pain with lateral compression of the calcaneus and there is no pain with vibratory sensation. There is no pain along the course or insertion of the Achilles tendon. There are no other areas of tenderness to bilateral lower extremities. No gross boney pedal deformities bilateral. No pain, crepitus, or limitation noted with foot and ankle range of motion bilateral. Muscular strength 5/5 in all groups tested  bilateral.  Gait: Unassisted, Nonantalgic.   Assessment: Presents for follow-up evaluation for heel pain, likely plantar fasciitis   Plan: -Treatment options discussed including all alternatives, risks, and complications -Discussed with him to continue with stretching, icing exercises daily as well as wearing supportive shoes and discussed inserts. Continue the plantar fascial braces well. Monitor for any recurrence. -Follow-up as needed. In the meantime, encouraged to call the office with any questions, concerns, change in symptoms.   Celesta Gentile, DPM

## 2017-05-29 ENCOUNTER — Ambulatory Visit (INDEPENDENT_AMBULATORY_CARE_PROVIDER_SITE_OTHER): Payer: Medicare HMO | Admitting: Family Medicine

## 2017-05-29 VITALS — BP 154/76 | HR 59 | Temp 98.1°F | Ht 69.0 in | Wt 207.0 lb

## 2017-05-29 DIAGNOSIS — E669 Obesity, unspecified: Secondary | ICD-10-CM

## 2017-05-29 DIAGNOSIS — Z6830 Body mass index (BMI) 30.0-30.9, adult: Secondary | ICD-10-CM | POA: Diagnosis not present

## 2017-05-29 DIAGNOSIS — E782 Mixed hyperlipidemia: Secondary | ICD-10-CM | POA: Diagnosis not present

## 2017-05-29 NOTE — Progress Notes (Signed)
Office: [phone]  /  Fax: [phone]   HPI:   Chief Complaint: OBESITY Philip [name] is here to discuss his progress with his obesity treatment plan. He is on the  follow the Category 3 plan and is following his eating plan approximately 80 % of the time. He states he is walking for 60 minutes 5 times per week. Philip [name] continues to do well with weight loss. He is eating healthier and is increasing lean protein and vegetables. He doesn't feel weak or tired and energy has improved. He is walking now that his plantar fasciitis is improved. His weight is 207 lb (93.9 kg) today and has had a weight loss of 2 pounds over a period of 3 weeks since his last visit. He has lost 23 lbs since starting treatment with [place].  Mixed Hyperlipidemia Philip [name] has hyperlipidemia and is currently on lipitor 40 mg and he denies any chest pain, claudication or myalgias. Philip [name] has been working on improving his cholesterol levels with intensive lifestyle modification including a low saturated fat diet, exercise and weight loss with the goal of decreasing medications eventually. His last labs were at goal.    ALLERGIES: No Known Allergies  MEDICATIONS: Current Outpatient Prescriptions on File Prior to Visit  Medication Sig Dispense Refill   amLODipine (NORVASC) 10 MG tablet Take 0.5 tablets (5 mg total) by mouth daily.     apixaban (ELIQUIS) 5 MG TABS tablet Take 1 tablet (5 mg total) by mouth 2 (two) times daily. 180 tablet 1   atorvastatin (LIPITOR) 40 MG tablet Take 1 tablet (40 mg total) by mouth at bedtime. 90 tablet 1   B Complex-Biotin-FA (B-COMPLEX PO) Take 1 tablet by mouth daily.     Cholecalciferol (VITAMIN D3) 5000 UNITS CAPS Take 5,000 Units by mouth daily.     co-enzyme Q-10 30 MG capsule Take 30 mg by mouth daily.     fenofibrate (TRICOR) 48 MG tablet Take 1 tablet (48 mg total) by mouth daily. 90 tablet 1   hydrocortisone 2.5 % cream Apply 1 application topically as needed.     magnesium  oxide (MAG-OX) 400 MG tablet Take 800 mg by mouth daily.     metoprolol succinate (TOPROL-XL) 50 MG 24 hr tablet Take 1 tablet (50 mg total) by mouth daily. Take with or immediately following a meal. 90 tablet 1   Multiple Vitamins-Minerals (MULTIVITAMIN WITH MINERALS) tablet Take 1 tablet by mouth daily.     Omega-3 Fatty Acids (FISH OIL) 1200 MG CAPS Take 1,200 mg by mouth daily.     polyethylene glycol (MIRALAX / GLYCOLAX) packet Take 17 g by mouth daily.     lisinopril (PRINIVIL,ZESTRIL) 10 MG tablet Take 1 tablet (10 mg total) by mouth daily. 90 tablet 1   Current Facility-Administered Medications on File Prior to Visit  Medication Dose Route Frequency Provider Last Rate Last Dose   triamcinolone acetonide (KENALOG) 10 MG/ML injection 10 mg  10 mg Other Once Trula Slade, DPM        PAST MEDICAL HISTORY: Past Medical History:  Diagnosis Date   AAA (abdominal aortic aneurysm) ([place])    Atrial fibrillation ([place])    CAD (coronary artery disease)    had a MI , s/p CABG   Cataracts, bilateral    immature   CHF (congestive heart failure) ([facility])    Dysrhythmia    paroximal a fib   GERD (gastroesophageal reflux disease)    takes Omeprazole daily   History of colon polyps  History of kidney stones    History of shingles    Hyperlipidemia    Hypertension    takes Amlodipine,Metoprolol,and Lisinopril daily   Joint pain    Joint swelling    Myocardial infarction [facility])    several with last one being 2001(but never knew about any except 2001)   OSA (obstructive sleep apnea)    started CPAP 12/09   Peripheral vascular disease ([place])    Pneumonia    as a child   Skin cancer    several , one of them was melanoma (sees derm routinely)   Tingling    feet occasionally   Tinnitus     PAST SURGICAL HISTORY: Past Surgical History:  Procedure Laterality Date   ABDOMINAL AORTAGRAM N/A 06/16/2014   Procedure: ABDOMINAL Maxcine Ham;  Surgeon: Angelia Mould, MD;  Location: [facility] CATH LAB;  Service: Cardiovascular;  Laterality: N/A;   ABDOMINAL AORTIC ENDOVASCULAR STENT GRAFT N/A 07/08/2014   Procedure: ABDOMINAL AORTIC ENDOVASCULAR STENT GRAFT/ GORE;  Surgeon: Angelia Mould, MD;  Location: [facility];  Service: Vascular;  Laterality: N/A;   CARDIAC CATHETERIZATION  2015   COLONOSCOPY     CORONARY ARTERY BYPASS GRAFT  1999   x 3 vessels   LEFT HEART CATHETERIZATION WITH CORONARY/GRAFT ANGIOGRAM N/A 05/29/2014   Procedure: LEFT HEART CATHETERIZATION WITH [name];  Surgeon: [name], MD;  [facility] C[facility] LAB;  Service: Cardiovascular;  Laterality: N/A;   PENILE PROSTHESIS IMPLANT  08/2005   RHINOPLASTY  1975    SOCIAL HISTORY: Social History  Substance Use Topics   Smoking status: Former Smoker   Smokeless tobacco: Never Used     Comment: quit smoking in 1979   Alcohol use Yes     Comment: occasionally - once per week    FAMILY HISTORY: Family History  Problem Relation Age of Onset   Prostate cancer Father 42   Heart disease Father    Skin cancer Father    Heart attack Father    Cancer Father    Deep vein thrombosis Father    Hyperlipidemia Father    Hypertension Father    Heart disease Mother    Skin cancer Mother    Heart attack Mother    Cancer Mother    Hyperlipidemia Mother    Hypertension Mother    Varicose Veins Mother    Peripheral vascular disease Mother        amputation   Sleep apnea Brother    Hyperlipidemia Brother    Hypertension Brother    Colon cancer Other        aunt   Skin cancer Brother    Skin cancer Sister    Cancer Sister    Heart disease Sister    Diabetes Neg Hx    Stroke Neg Hx     ROS: Review of Systems  Constitutional: Positive for weight loss.  Cardiovascular: Negative for chest pain and claudication.  Musculoskeletal: Negative for myalgias.    PHYSICAL EXAM: Blood pressure (!) 154/76, pulse (!) 59, temperature  98.1 F (36.7 C), temperature source Oral, height 5\' 9"  (1.753 m), weight 207 lb (93.9 kg), SpO2 98 %. Body mass index is 30.57 kg/m. Physical Exam  Constitutional: He is oriented to person, place, and time. He appears well-developed and well-nourished.  Cardiovascular: Normal rate.   Pulmonary/Chest: Effort normal.  Musculoskeletal: Normal range of motion.  Neurological: He is oriented to person, place, and time.  Skin: Skin is warm and dry.  Psychiatric:  He has a normal mood and affect. His behavior is normal.  Vitals reviewed.   RECENT LABS AND TESTS: BMET    Component Value Date/Time   NA 142 01/18/2017 0938   K 4.5 01/18/2017 0938   CL 100 01/18/2017 0938   CO2 26 01/18/2017 0938   GLUCOSE 88 01/18/2017 0938   GLUCOSE 107 (H) 12/07/2016 0852   BUN 17 01/18/2017 0938   CREATININE 1.18 01/18/2017 0938   CREATININE 1.08 12/14/2015 1122   CALCIUM 10.3 (H) 01/18/2017 0938   GFRNONAA 60 01/18/2017 0938   GFRAA 69 01/18/2017 0938   Lab Results  Component Value Date   HGBA1C 5.7 (H) 04/19/2017   HGBA1C 5.9 (H) 01/18/2017   HGBA1C 6.1 12/07/2016   Lab Results  Component Value Date   INSULIN 17.3 04/19/2017   INSULIN 16.1 01/18/2017   CBC    Component Value Date/Time   WBC 4.7 01/18/2017 0938   WBC 5.6 12/07/2016 0852   RBC 4.76 01/18/2017 0938   RBC 4.72 12/07/2016 0852   HGB 15.0 01/18/2017 0938   HCT 43.9 01/18/2017 0938   PLT 185.0 12/07/2016 0852   PLT 156 04/07/2010   MCV 92 01/18/2017 0938   MCH 31.5 01/18/2017 0938   MCH 31.1 12/14/2015 1122   MCHC 34.2 01/18/2017 0938   MCHC 34.3 12/07/2016 0852   RDW 13.1 01/18/2017 0938   LYMPHSABS 2.0 01/18/2017 0938   MONOABS 0.4 12/07/2016 0852   EOSABS 0.1 01/18/2017 0938   BASOSABS 0.0 01/18/2017 0938   Iron/TIBC/Ferritin/ %Sat No results found for: IRON, TIBC, FERRITIN, IRONPCTSAT Lipid Panel     Component Value Date/Time   CHOL 118 04/19/2017 0818   TRIG 81 04/19/2017 0818   TRIG 256 09/14/2009 0000    HDL 42 04/19/2017 0818   CHOLHDL 3 12/07/2016 0852   VLDL 30.8 12/07/2016 0852   LDLCALC 60 04/19/2017 0818   LDLDIRECT 68.0 04/13/2015 0823   Hepatic Function Panel     Component Value Date/Time   PROT 7.0 01/18/2017 0938   ALBUMIN 4.7 01/18/2017 0938   AST 32 01/18/2017 0938   ALT 27 01/18/2017 0938   ALKPHOS 61 01/18/2017 0938   BILITOT 0.5 01/18/2017 0938      Component Value Date/Time   TSH 5.110 (H) 04/19/2017 0818   TSH 4.960 (H) 01/18/2017 0938   TSH 4.36 12/07/2016 0852    ASSESSMENT AND PLAN: Mixed hyperlipidemia  Class 1 obesity with serious comorbidity and body mass index (BMI) of 30.0 to 30.9 in adult, unspecified obesity type  PLAN:  Mixed Hyperlipidemia Marquist was informed of the American Heart Association Guidelines emphasizing intensive lifestyle modifications as the first line treatment for hyperlipidemia. We discussed many lifestyle modifications today in depth, and Philip [name] will continue to work on decreasing saturated fats such as fatty red meat, butter and many fried foods. He will also increase vegetables and lean protein in his diet and continue to work on exercise and weight loss efforts. Great job so far! Yovanni agrees to continue Lipitor as prescribed and will follow up with our clinic in 4 weeks.  We spent > than 50% of the 15 minute visit on the counseling as documented in the note.   Obesity  Ehab is currently in the action stage of change. As such, his goal is to continue with weight loss efforts He has agreed to keep a food journal with 500 to 600 calories and 40 grams of protein at supper daily and follow the Category 3 plan  Cashis has been instructed to work up to a goal of 150 minutes of combined cardio and strengthening exercise per week for weight loss and overall health benefits. We discussed the following Behavioral Modification Strategies today: increasing lean protein intake, no skipping meals and better snacking choices  Lehman  has agreed to follow up with our clinic in 4 weeks. He was informed of the importance of frequent follow up visits to maximize his success with intensive lifestyle modifications for his multiple health conditions.  I, Doreene Nest, am acting as transcriptionist for [name], MD  I have reviewed the above documentation for accuracy and completeness, and I agree with the above. -[name], MD  OBESITY BEHAVIORAL INTERVENTION VISIT  Today's visit was # 8 out of 22.  Starting weight: 230 lbs Starting date: 01/18/17 Today's weight : 207 lbs  Today's date: 05/29/2017 Total lbs lost to date: 23 (Patients must lose 7 lbs in the first 6 months to continue with counseling)   ASK: We discussed the diagnosis of obesity with [name] today and Satya agreed to give [place] permission to discuss obesity behavioral modification therapy today.  ASSESS: Trisha has the diagnosis of obesity and his BMI today is 30.6 Kristen is in the action stage of change   ADVISE: Arran was educated on the multiple health risks of obesity as well as the benefit of weight loss to improve his health. He was advised of the need for long term treatment and the importance of lifestyle modifications.  AGREE: Multiple dietary modification options and treatment options were discussed and  Kamaron agreed to keep a food journal with 500 to 600 calories and 40 grams of protein at supper daily and follow the Category 3 plan We discussed the following Behavioral Modification Strategies today: increasing lean protein intake, no skipping meals and better snacking choices

## 2017-06-05 DIAGNOSIS — G4733 Obstructive sleep apnea (adult) (pediatric): Secondary | ICD-10-CM | POA: Diagnosis not present

## 2017-06-16 ENCOUNTER — Ambulatory Visit (INDEPENDENT_AMBULATORY_CARE_PROVIDER_SITE_OTHER): Payer: Medicare HMO | Admitting: Internal Medicine

## 2017-06-16 ENCOUNTER — Encounter: Payer: Self-pay | Admitting: Internal Medicine

## 2017-06-16 VITALS — BP 120/68 | HR 70 | Ht 69.0 in | Wt 214.6 lb

## 2017-06-16 DIAGNOSIS — I10 Essential (primary) hypertension: Secondary | ICD-10-CM

## 2017-06-16 DIAGNOSIS — I714 Abdominal aortic aneurysm, without rupture, unspecified: Secondary | ICD-10-CM

## 2017-06-16 DIAGNOSIS — E785 Hyperlipidemia, unspecified: Secondary | ICD-10-CM

## 2017-06-16 DIAGNOSIS — I2510 Atherosclerotic heart disease of native coronary artery without angina pectoris: Secondary | ICD-10-CM | POA: Diagnosis not present

## 2017-06-16 DIAGNOSIS — I480 Paroxysmal atrial fibrillation: Secondary | ICD-10-CM | POA: Diagnosis not present

## 2017-06-16 MED ORDER — APIXABAN 5 MG PO TABS: 5.0000 mg | ORAL_TABLET | Freq: Two times a day (BID) | ORAL | 3 refills | Status: DC

## 2017-06-16 NOTE — Patient Instructions (Signed)
Medication Instructions:  Your physician recommends that you continue on your current medications as directed. Please refer to the Current Medication list given to you today.Worthy Keeler: none  Testing/Procedures: none  Follow-Up: Your physician wants you to follow-up in: 6 months with Dr End. (February 2019)  You will receive a reminder letter in the mail two months in advance. If you don't receive a letter, please call our office to schedule the follow-up appointment.        If you need a refill on your cardiac medications before your next appointment, please call your pharmacy. Marland Kitchen

## 2017-06-16 NOTE — Progress Notes (Signed)
Follow-up Outpatient Visit Date: 06/16/2017  Primary Care Provider: Colon Branch, MD [address] [place] [address]  Chief Complaint: Follow-up coronary artery disease  HPI:  Philip [name] is a 76 y.o. year-old male with history of coronary artery disease status post CABG in 1999, AAA status post endovascular repair in 2015, paroxysmal atrial fibrillation, hypertension, hyperlipidemia, and CKD stage III, who presents for follow-up. He is doing well today. He denies further episodes of dizziness her low blood pressure. He has not had any chest pain, shortness of breath, palpitations, orthopnea, PND, or edema. He is trying to walk 30 minutes twice a day most days. He also does some pushups. He was able to walk 18 holes at the [facility] today without any difficulty. He has continued to lose weight, though it seems to have plateaued recently. He is tolerating apixaban well without bleeding.  --------------------------------------------------------------------------------------------------  Cardiovascular History & Procedures: Cardiovascular Problems:  Paroxysmal atrial fibrillation  Coronary artery disease status post CABG  Abdominal aortic aneurysm status post endovascular repair  Risk Factors:  Known coronary artery disease, PAD, hypertension, hyperlipidemia, male gender, obesity, and age greater than 77  Cath/PCI:  LHC (05/29/14): LMCA occluded distally. LAD ostial occluded. LCx ostially occluded. RCA proximally occluded. Widely patent LIMA to LAD, SVG to OM, and SVG to distal RCA.  CV Surgery:  Endovascular AAA repair (07/08/14, Dr. Scot [name])  CABG (1999): LIMA to LAD, SVG to OM, and SVG to distal RCA  EP Procedures and Devices:  None  Non-Invasive Evaluation(s):  Exercise MPI (01/12/17): Intermediate risk study with small in size, mild in severity mid anteroseptal defect consistent with scar. Small in size mild severity, mid anterior defect that is  reversible and consistent with ischemia in the diagonal territory. LVEF 47%. Overall, study is stable to improved compared with the previous test in 2015.  Pharmacologic MPI (05/16/14): Intermediate risk study with reversible anterior and apical ischemia as well as fixed basal to mid inferior defect. LVEF 48% with basal to mid inferior hypokinesis.  TTE (05/16/14): Normal LV size and wall thickness with LVEF of 55-60%. Mild left atrial enlargement. Normal RV size and function. Mitral annular calcification with mildly thickened leaflets. Aortic sclerosis.  Recent CV Pertinent Labs: Lab Results  Component Value Date   CHOL 118 04/19/2017   HDL 42 04/19/2017   LDLCALC 60 04/19/2017   LDLDIRECT 68.0 04/13/2015   TRIG 81 04/19/2017   TRIG 256 09/14/2009   CHOLHDL 3 12/07/2016   INR 1.18 07/08/2014   K 4.5 01/18/2017   MG 1.8 07/08/2014   BUN 17 01/18/2017   CREATININE 1.18 01/18/2017   CREATININE 1.08 12/14/2015    Past medical and surgical history were reviewed and updated in EPIC.  Current Meds  Medication Sig  . amLODipine (NORVASC) 10 MG tablet Take 0.5 tablets (5 mg total) by mouth daily.  Marland Kitchen apixaban (ELIQUIS) 5 MG TABS tablet Take 1 tablet (5 mg total) by mouth 2 (two) times daily.  Marland Kitchen atorvastatin (LIPITOR) 40 MG tablet Take 1 tablet (40 mg total) by mouth at bedtime.  . B Complex-Biotin-FA (B-COMPLEX PO) Take 1 tablet by mouth daily.  . Cholecalciferol (VITAMIN D3) 5000 UNITS CAPS Take 5,000 Units by mouth daily.  Marland Kitchen co-enzyme Q-10 30 MG capsule Take 30 mg by mouth daily.  . fenofibrate (TRICOR) 48 MG tablet Take 1 tablet (48 mg total) by mouth daily.  . hydrocortisone 2.5 % cream Apply 1 application topically as needed.  Marland Kitchen  lisinopril (PRINIVIL,ZESTRIL) 10 MG tablet Take 1 tablet (10 mg total) by mouth daily.  . magnesium oxide (MAG-OX) 400 MG tablet Take 800 mg by mouth daily.  . metoprolol succinate (TOPROL-XL) 50 MG 24 hr tablet Take 1 tablet (50 mg total) by mouth daily. Take  with or immediately following a meal.  . Multiple Vitamins-Minerals (MULTIVITAMIN WITH MINERALS) tablet Take 1 tablet by mouth daily.  . Omega-3 Fatty Acids (FISH OIL) 1200 MG CAPS Take 1,200 mg by mouth daily.  . polyethylene glycol (MIRALAX / GLYCOLAX) packet Take 17 g by mouth daily.  . [DISCONTINUED] apixaban (ELIQUIS) 5 MG TABS tablet Take 1 tablet (5 mg total) by mouth 2 (two) times daily.   Current Facility-Administered Medications for the 06/16/17 encounter (Office Visit) with Carah Barrientes, Harrell Gave, MD  Medication  . triamcinolone acetonide (KENALOG) 10 MG/ML injection 10 mg    Allergies: Patient has no known allergies.  Social History   Social History  . Marital status: Married    Spouse name: N/A  . Number of children: 3  . Years of education: 12   Occupational History  . retired, Actor    Social History Main Topics  . Smoking status: Former Research scientist (life sciences)  . Smokeless tobacco: Never Used     Comment: quit smoking in 1979  . Alcohol use Yes     Comment: occasionally - once per week  . Drug use: No  . Sexual activity: Not Currently   Other Topics Concern  . Not on file   Social History Narrative   Lives w/ wife in a 2 story home.  Has one son and 2 stepchildren.  Retired for [employer].     Education: high school.       Family History  Problem Relation Age of Onset  . Prostate cancer Father 58  . Heart disease Father   . Skin cancer Father   . Heart attack Father   . Cancer Father   . Deep vein thrombosis Father   . Hyperlipidemia Father   . Hypertension Father   . Heart disease Mother   . Skin cancer Mother   . Heart attack Mother   . Cancer Mother   . Hyperlipidemia Mother   . Hypertension Mother   . Varicose Veins Mother   . Peripheral vascular disease Mother        amputation  . Sleep apnea Brother   . Hyperlipidemia Brother   . Hypertension Brother   . Colon cancer Other        aunt  . Skin cancer Brother   . Skin cancer Sister   . Cancer Sister    . Heart disease Sister   . Diabetes Neg Hx   . Stroke Neg Hx     Review of Systems: A 12-system review of systems was performed and was negative except as noted in the HPI.  --------------------------------------------------------------------------------------------------  Physical Exam: BP 120/68   Pulse 70   Ht 5\' 9"  (1.753 m)   Wt 214 lb 9.6 oz (97.3 kg)   SpO2 96%   BMI 31.69 kg/m   General:  Overweight man, seated comfortably in the exam room. HEENT: No conjunctival pallor or scleral icterus.  Moist mucous membranes.  OP clear. Neck: Supple without lymphadenopathy, thyromegaly, JVD, or HJR. Lungs: Normal work of breathing.  Clear to auscultation bilaterally without wheezes or crackles. Heart: Regular rate and rhythm without murmurs, rubs, or gallops.  Non-displaced PMI. Abd: Bowel sounds present.  Soft, NT/ND without hepatosplenomegaly Ext: No  lower extremity edema.  Radial, PT, and DP pulses are 2+ bilaterally. Skin: Warm and dry without rash. Well-healed median sternotomy scar.   Lab Results  Component Value Date   WBC 4.7 01/18/2017   HGB 15.0 01/18/2017   HCT 43.9 01/18/2017   MCV 92 01/18/2017   PLT 185.0 12/07/2016    Lab Results  Component Value Date   NA 142 01/18/2017   K 4.5 01/18/2017   CL 100 01/18/2017   CO2 26 01/18/2017   BUN 17 01/18/2017   CREATININE 1.18 01/18/2017   GLUCOSE 88 01/18/2017   ALT 27 01/18/2017    Lab Results  Component Value Date   CHOL 118 04/19/2017   HDL 42 04/19/2017   LDLCALC 60 04/19/2017   LDLDIRECT 68.0 04/13/2015   TRIG 81 04/19/2017   CHOLHDL 3 12/07/2016    --------------------------------------------------------------------------------------------------  ASSESSMENT AND PLAN: Paroxysmal atrial fibrillation No symptoms to suggest recurrence. Exam is notable for a regular rate and rhythm today. Philip [name] is tolerating apixaban well, which we will continue with indefinitely given his CHADSVASC score of at  least 4.  Coronary artery disease without angina No symptoms to suggest recurrence. Philip [name] remains quite active. We will continue his current medications for secondary prevention.  Hypertension Blood pressure is well controlled today. Lightheadedness improved with de-escalation of metoprolol after her last visit.  Hyperlipidemia LDL at goal in June. Continue current medications.  AAA No new symptoms. Continue follow-up with Dr. Scot [name] as previously arranged.  Follow-up: Return to clinic in 6 months.  Nelva Bush, MD 06/16/2017 4:52 PM

## 2017-06-26 ENCOUNTER — Ambulatory Visit (INDEPENDENT_AMBULATORY_CARE_PROVIDER_SITE_OTHER): Payer: Medicare HMO | Admitting: Family Medicine

## 2017-06-26 VITALS — BP 129/73 | HR 69 | Temp 97.7°F | Ht 69.0 in | Wt 208.0 lb

## 2017-06-26 DIAGNOSIS — E669 Obesity, unspecified: Secondary | ICD-10-CM

## 2017-06-26 DIAGNOSIS — M1612 Unilateral primary osteoarthritis, left hip: Secondary | ICD-10-CM | POA: Diagnosis not present

## 2017-06-26 DIAGNOSIS — Z6830 Body mass index (BMI) 30.0-30.9, adult: Secondary | ICD-10-CM | POA: Diagnosis not present

## 2017-06-26 NOTE — Progress Notes (Signed)
Office: [phone]  /  Fax: [phone]   HPI:   Chief Complaint: OBESITY Philip [name] is here to discuss his progress with his obesity treatment plan. He is on the Category 3 plan and is following his eating plan approximately 80 % of the time. He states he is walking, doing push ups and golfing for 60 minutes 7 times per week. Philip [name] has done very well maintaining weight since last visit. He is exercising approximately 5 to 6 times per week and playing golf 2 to 3 times per week and is walking the holes. His weight is 208 lb (94.3 kg) today and has had a weight gain of 1 pound over a period of 4 weeks since his last visit. He has lost 22 lbs since starting treatment with [place].  Osteoarthritis of Left Hip Philip [name] has done well with weight loss to help with pain. He still notes pain but is able to move through the pain. Overall his pain is better with weight loss. Philip [name] takes Tylenol but gets minimal pain relief.  ALLERGIES: No Known Allergies  MEDICATIONS: Current Outpatient Prescriptions on File Prior to Visit  Medication Sig Dispense Refill   amLODipine (NORVASC) 10 MG tablet Take 0.5 tablets (5 mg total) by mouth daily.     apixaban (ELIQUIS) 5 MG TABS tablet Take 1 tablet (5 mg total) by mouth 2 (two) times daily. 180 tablet 3   atorvastatin (LIPITOR) 40 MG tablet Take 1 tablet (40 mg total) by mouth at bedtime. 90 tablet 1   B Complex-Biotin-FA (B-COMPLEX PO) Take 1 tablet by mouth daily.     Cholecalciferol (VITAMIN D3) 5000 UNITS CAPS Take 5,000 Units by mouth daily.     co-enzyme Q-10 30 MG capsule Take 30 mg by mouth daily.     fenofibrate (TRICOR) 48 MG tablet Take 1 tablet (48 mg total) by mouth daily. 90 tablet 1   hydrocortisone 2.5 % cream Apply 1 application topically as needed.     magnesium oxide (MAG-OX) 400 MG tablet Take 800 mg by mouth daily.     metoprolol succinate (TOPROL-XL) 50 MG 24 hr tablet Take 1 tablet (50 mg total) by mouth daily. Take with or  immediately following a meal. 90 tablet 1   Multiple Vitamins-Minerals (MULTIVITAMIN WITH MINERALS) tablet Take 1 tablet by mouth daily.     Omega-3 Fatty Acids (FISH OIL) 1200 MG CAPS Take 1,200 mg by mouth daily.     polyethylene glycol (MIRALAX / GLYCOLAX) packet Take 17 g by mouth daily.     lisinopril (PRINIVIL,ZESTRIL) 10 MG tablet Take 1 tablet (10 mg total) by mouth daily. 90 tablet 1   Current Facility-Administered Medications on File Prior to Visit  Medication Dose Route Frequency Provider Last Rate Last Dose   triamcinolone acetonide (KENALOG) 10 MG/ML injection 10 mg  10 mg Other Once Trula Slade, DPM        PAST MEDICAL HISTORY: Past Medical History:  Diagnosis Date   AAA (abdominal aortic aneurysm) ([place])    Atrial fibrillation ([place])    CAD (coronary artery disease)    had a MI , s/p CABG   Cataracts, bilateral    immature   CHF (congestive heart failure) ([facility])    Dysrhythmia    paroximal a fib   GERD (gastroesophageal reflux disease)    takes Omeprazole daily   History of colon polyps    History of kidney stones    History of shingles    Hyperlipidemia  Hypertension    takes Amlodipine,Metoprolol,and Lisinopril daily   Joint pain    Joint swelling    Myocardial infarction [facility])    several with last one being 2001(but never knew about any except 2001)   OSA (obstructive sleep apnea)    started CPAP 12/09   Peripheral vascular disease ([place])    Pneumonia    as a child   Skin cancer    several , one of them was melanoma (sees derm routinely)   Tingling    feet occasionally   Tinnitus     PAST SURGICAL HISTORY: Past Surgical History:  Procedure Laterality Date   ABDOMINAL AORTAGRAM N/A 06/16/2014   Procedure: ABDOMINAL Maxcine Ham;  Surgeon: Angelia Mould, MD;  Location: [facility] CATH LAB;  Service: Cardiovascular;  Laterality: N/A;   ABDOMINAL AORTIC ENDOVASCULAR STENT GRAFT N/A 07/08/2014   Procedure: ABDOMINAL AORTIC  ENDOVASCULAR STENT GRAFT/ GORE;  Surgeon: Angelia Mould, MD;  Location: [place];  Service: Vascular;  Laterality: N/A;   CARDIAC CATHETERIZATION  2015   COLONOSCOPY     CORONARY ARTERY BYPASS GRAFT  1999   x 3 vessels   LEFT HEART CATHETERIZATION WITH CORONARY/GRAFT ANGIOGRAM N/A 05/29/2014   Procedure: LEFT HEART CATHETERIZATION WITH [name];  Surgeon: [name], MD;  Location: [facility] CATH LAB;  Service: Cardiovascular;  Laterality: N/A;   PENILE PROSTHESIS IMPLANT  08/2005   RHINOPLASTY  1975    SOCIAL HISTORY: Social History  Substance Use Topics   Smoking status: Former Smoker   Smokeless tobacco: Never Used     Comment: quit smoking in 1979   Alcohol use Yes     Comment: occasionally - once per week    FAMILY HISTORY: Family History  Problem Relation Age of Onset   Prostate cancer Father 29   Heart disease Father    Skin cancer Father    Heart attack Father    Cancer Father    Deep vein thrombosis Father    Hyperlipidemia Father    Hypertension Father    Heart disease Mother    Skin cancer Mother    Heart attack Mother    Cancer Mother    Hyperlipidemia Mother    Hypertension Mother    Varicose Veins Mother    Peripheral vascular disease Mother        amputation   Sleep apnea Brother    Hyperlipidemia Brother    Hypertension Brother    Colon cancer Other        aunt   Skin cancer Brother    Skin cancer Sister    Cancer Sister    Heart disease Sister    Diabetes Neg Hx    Stroke Neg Hx     ROS: Review of Systems  Constitutional: Negative for weight loss.  Musculoskeletal:       Left Hip Pain    PHYSICAL EXAM: Blood pressure 129/73, pulse 69, temperature 97.7 F (36.5 C), temperature source Oral, height 5\' 9"  (1.753 m), weight 208 lb (94.3 kg), SpO2 99 %. Body mass index is 30.72 kg/m. Physical Exam  Constitutional: He is oriented to person, place, and time. He appears well-developed and  well-nourished.  Cardiovascular: Normal rate.   Pulmonary/Chest: Effort normal.  Musculoskeletal: Normal range of motion.  Neurological: He is oriented to person, place, and time.  Skin: Skin is warm and dry.  Psychiatric: He has a normal mood and affect. His behavior is normal.  Vitals reviewed.   RECENT LABS AND TESTS: BMET  Component Value Date/Time   NA 142 01/18/2017 0938   K 4.5 01/18/2017 0938   CL 100 01/18/2017 0938   CO2 26 01/18/2017 0938   GLUCOSE 88 01/18/2017 0938   GLUCOSE 107 (H) 12/07/2016 0852   BUN 17 01/18/2017 0938   CREATININE 1.18 01/18/2017 0938   CREATININE 1.08 12/14/2015 1122   CALCIUM 10.3 (H) 01/18/2017 0938   GFRNONAA 60 01/18/2017 0938   GFRAA 69 01/18/2017 0938   Lab Results  Component Value Date   HGBA1C 5.7 (H) 04/19/2017   HGBA1C 5.9 (H) 01/18/2017   HGBA1C 6.1 12/07/2016   Lab Results  Component Value Date   INSULIN 17.3 04/19/2017   INSULIN 16.1 01/18/2017   CBC    Component Value Date/Time   WBC 4.7 01/18/2017 0938   WBC 5.6 12/07/2016 0852   RBC 4.76 01/18/2017 0938   RBC 4.72 12/07/2016 0852   HGB 15.0 01/18/2017 0938   HCT 43.9 01/18/2017 0938   PLT 185.0 12/07/2016 0852   PLT 156 04/07/2010   MCV 92 01/18/2017 0938   MCH 31.5 01/18/2017 0938   MCH 31.1 12/14/2015 1122   MCHC 34.2 01/18/2017 0938   MCHC 34.3 12/07/2016 0852   RDW 13.1 01/18/2017 0938   LYMPHSABS 2.0 01/18/2017 0938   MONOABS 0.4 12/07/2016 0852   EOSABS 0.1 01/18/2017 0938   BASOSABS 0.0 01/18/2017 0938   Iron/TIBC/Ferritin/ %Sat No results found for: IRON, TIBC, FERRITIN, IRONPCTSAT Lipid Panel     Component Value Date/Time   CHOL 118 04/19/2017 0818   TRIG 81 04/19/2017 0818   TRIG 256 09/14/2009 0000   HDL 42 04/19/2017 0818   CHOLHDL 3 12/07/2016 0852   VLDL 30.8 12/07/2016 0852   LDLCALC 60 04/19/2017 0818   LDLDIRECT 68.0 04/13/2015 0823   Hepatic Function Panel     Component Value Date/Time   PROT 7.0 01/18/2017 0938    ALBUMIN 4.7 01/18/2017 0938   AST 32 01/18/2017 0938   ALT 27 01/18/2017 0938   ALKPHOS 61 01/18/2017 0938   BILITOT 0.5 01/18/2017 0938      Component Value Date/Time   TSH 5.110 (H) 04/19/2017 0818   TSH 4.960 (H) 01/18/2017 0938   TSH 4.36 12/07/2016 0852    ASSESSMENT AND PLAN: Osteoarthritis of left hip, unspecified osteoarthritis type  Class 1 obesity without serious comorbidity with body mass index (BMI) of 30.0 to 30.9 in adult, unspecified obesity type  PLAN:  Osteoarthritis of Left Hip Philip [name] was encouraged to continue activity as tolerated and we discussed approved stretching exercised to minimize exacerbations. Philip [name] agrees to follow up with our clinic in 4 weeks.  We spent > than 50% of the 15 minute visit on the counseling as documented in the note.  Obesity Philip [name] is currently in the action stage of change. As such, his goal is to continue with weight loss efforts He has agreed to follow the Category 3 plan + 500 to 600 calories and 40+ grams of protein at supper daily Philip [name] has been instructed to work up to a goal of 150 minutes of combined cardio and strengthening exercise or continue walking, doing push ups and golfing for 60 minutes 7 times per week for weight loss and overall health benefits. We discussed the following Behavioral Modification Strategies today: increase H2O, no skipping meals, increasing lean protein intake, decreasing simple carbohydrates and holiday eating strategies   Philip [name] has agreed to follow up with our clinic in 4 weeks. He was informed of the importance of  frequent follow up visits to maximize his success with intensive lifestyle modifications for his multiple health conditions.  I, Doreene Nest, am acting as transcriptionist for Philip Nip, MD  I have reviewed the above documentation for accuracy and completeness, and I agree with the above. -Philip Nip, MD   OBESITY BEHAVIORAL INTERVENTION VISIT  Today's visit was # 9  out of 22.  Starting weight: 230 lbs Starting date: 01/18/17 Today's weight : 208 lbs Today's date: 06/26/2017 Total lbs lost to date: 31 (Patients must lose 7 lbs in the first 6 months to continue with counseling)   ASK: We discussed the diagnosis of obesity with [name] today and Riad agreed to give [place] permission to discuss obesity behavioral modification therapy today.  ASSESS: Keilyn has the diagnosis of obesity and his BMI today is 30.7 [place] is in the action stage of change   ADVISE: Brewer was educated on the multiple health risks of obesity as well as the benefit of weight loss to improve his health. He was advised of the need for long term treatment and the importance of lifestyle modifications.  AGREE: Multiple dietary modification options and treatment options were discussed and  Nassim agreed to follow the Category 3 plan + 500 to 600 calories and 40+ grams of protein daily We discussed the following Behavioral Modification Strategies today: increase H2O, no skipping meals, increasing lean protein intake, decreasing simple carbohydrates  and holiday eating strategies

## 2017-07-07 DIAGNOSIS — H318 Other specified disorders of choroid: Secondary | ICD-10-CM | POA: Diagnosis not present

## 2017-07-07 DIAGNOSIS — H43393 Other vitreous opacities, bilateral: Secondary | ICD-10-CM | POA: Diagnosis not present

## 2017-07-07 DIAGNOSIS — H43813 Vitreous degeneration, bilateral: Secondary | ICD-10-CM | POA: Diagnosis not present

## 2017-07-09 ENCOUNTER — Other Ambulatory Visit: Payer: Self-pay | Admitting: Internal Medicine

## 2017-07-09 DIAGNOSIS — I2510 Atherosclerotic heart disease of native coronary artery without angina pectoris: Secondary | ICD-10-CM

## 2017-07-09 DIAGNOSIS — R5383 Other fatigue: Secondary | ICD-10-CM

## 2017-07-09 DIAGNOSIS — R0609 Other forms of dyspnea: Secondary | ICD-10-CM

## 2017-07-10 NOTE — Telephone Encounter (Signed)
Refill Request.  

## 2017-07-21 ENCOUNTER — Telehealth: Payer: Self-pay

## 2017-07-21 NOTE — Telephone Encounter (Addendum)
**Note De-Identified Philip [name] Obfuscation** Received a letter Philip [name] fax from [employer] stating that the pt is not eligible for pt assistance with Eliquis for the following reason: Medication is covered by the pts insurance.  Per letter [facility] is notifying the pt of their decision.

## 2017-07-24 ENCOUNTER — Ambulatory Visit (INDEPENDENT_AMBULATORY_CARE_PROVIDER_SITE_OTHER): Payer: Medicare HMO | Admitting: Family Medicine

## 2017-07-24 VITALS — BP 138/77 | HR 63 | Temp 97.9°F | Ht 69.0 in | Wt 206.0 lb

## 2017-07-24 DIAGNOSIS — R7303 Prediabetes: Secondary | ICD-10-CM

## 2017-07-24 DIAGNOSIS — E669 Obesity, unspecified: Secondary | ICD-10-CM

## 2017-07-24 DIAGNOSIS — Z6830 Body mass index (BMI) 30.0-30.9, adult: Secondary | ICD-10-CM | POA: Diagnosis not present

## 2017-07-24 NOTE — Progress Notes (Signed)
Office: [phone]  /  Fax: [phone]   HPI:   Chief Complaint: OBESITY Philip [name] is here to discuss his progress with his obesity treatment plan. He is on the  follow the Category 3 plan and is following his eating plan approximately 80 % of the time. He states he is walking 2 to 3 miles for 60 minutes 5 times per week. Philip [name] continues to do well with diet and exercise. He is journaling some of his dinners but he often eats at places where he has to guesstimate the nutrition of his meals. His weight is 206 lb (93.4 kg) today and has had a weight loss of 2 pounds over a period of 4 weeks since his last visit. He has lost 24 lbs since starting treatment with [place].  Pre-Diabetes Philip [name] has a diagnosis of pre-diabetes based on his elevated Hgb A1c and was informed this puts him at greater risk of developing diabetes. He notes decreased hypoglycemia on his category 3 plan. His last A1c was at 5.7 and he is doing well with exercise. He is not taking metformin currently and continues to work on diet and exercise to decrease risk of diabetes. He denies nausea.   ALLERGIES: No Known Allergies  MEDICATIONS: Current Outpatient Prescriptions on File Prior to Visit  Medication Sig Dispense Refill  . amLODipine (NORVASC) 10 MG tablet Take 0.5 tablets (5 mg total) by mouth daily.    Marland Kitchen apixaban (ELIQUIS) 5 MG TABS tablet Take 1 tablet (5 mg total) by mouth 2 (two) times daily. 180 tablet 3  . atorvastatin (LIPITOR) 40 MG tablet Take 1 tablet (40 mg total) by mouth at bedtime. 90 tablet 1  . B Complex-Biotin-FA (B-COMPLEX PO) Take 1 tablet by mouth daily.    . Cholecalciferol (VITAMIN D3) 5000 UNITS CAPS Take 5,000 Units by mouth daily.    Marland Kitchen co-enzyme Q-10 30 MG capsule Take 30 mg by mouth daily.    . fenofibrate (TRICOR) 48 MG tablet Take 1 tablet (48 mg total) by mouth daily. 90 tablet 1  . hydrocortisone 2.5 % cream Apply 1 application topically as needed.    Marland Kitchen lisinopril (PRINIVIL,ZESTRIL) 10 MG  tablet Take 1 tablet (10 mg total) by mouth daily. 90 tablet 3  . magnesium oxide (MAG-OX) 400 MG tablet Take 800 mg by mouth daily.    . Multiple Vitamins-Minerals (MULTIVITAMIN WITH MINERALS) tablet Take 1 tablet by mouth daily.    . Omega-3 Fatty Acids (FISH OIL) 1200 MG CAPS Take 1,200 mg by mouth daily.    . polyethylene glycol (MIRALAX / GLYCOLAX) packet Take 17 g by mouth daily.    . metoprolol succinate (TOPROL-XL) 50 MG 24 hr tablet Take 1 tablet (50 mg total) by mouth daily. Take with or immediately following a meal. 90 tablet 1   Current Facility-Administered Medications on File Prior to Visit  Medication Dose Route Frequency Provider Last Rate Last Dose  . triamcinolone acetonide (KENALOG) 10 MG/ML injection 10 mg  10 mg Other Once Trula Slade, DPM        PAST MEDICAL HISTORY: Past Medical History:  Diagnosis Date  . AAA (abdominal aortic aneurysm) ([place])   . Atrial fibrillation ([place])   . CAD (coronary artery disease)    had a MI , s/p CABG  . Cataracts, bilateral    immature  . CHF (congestive heart failure) ([place])   . Dysrhythmia    paroximal a fib  . GERD (gastroesophageal reflux disease)    takes Omeprazole  daily  . History of colon polyps   . History of kidney stones   . History of shingles   . Hyperlipidemia   . Hypertension    takes Amlodipine,Metoprolol,and Lisinopril daily  . Joint pain   . Joint swelling   . Myocardial infarction [facility])    several with last one being 2001(but never knew about any except 2001)  . OSA (obstructive sleep apnea)    started CPAP 12/09  . Peripheral vascular disease ([place])   . Pneumonia    as a child  . Skin cancer    several , one of them was melanoma (sees derm routinely)  . Tingling    feet occasionally  . Tinnitus     PAST SURGICAL HISTORY: Past Surgical History:  Procedure Laterality Date  . ABDOMINAL AORTAGRAM N/A 06/16/2014   Procedure: ABDOMINAL Maxcine Ham;  Surgeon: Angelia Mould, MD;  Location: [facility]  CATH LAB;  Service: Cardiovascular;  Laterality: N/A;  . ABDOMINAL AORTIC ENDOVASCULAR STENT GRAFT N/A 07/08/2014   Procedure: ABDOMINAL AORTIC ENDOVASCULAR STENT GRAFT/ GORE;  Surgeon: Angelia Mould, MD;  Location: [place];  Service: Vascular;  Laterality: N/A;  . CARDIAC CATHETERIZATION  2015  . COLONOSCOPY    . CORONARY ARTERY BYPASS GRAFT  1999   x 3 vessels  . LEFT HEART CATHETERIZATION WITH CORONARY/GRAFT ANGIOGRAM N/A 05/29/2014   Procedure: LEFT HEART CATHETERIZATION WITH [name];  Surgeon: [name], MD;  Location: [facility] CATH LAB;  Service: Cardiovascular;  Laterality: N/A;  . PENILE PROSTHESIS IMPLANT  08/2005  . RHINOPLASTY  1975    SOCIAL HISTORY: Social History  Substance Use Topics  . Smoking status: Former Research scientist (life sciences)  . Smokeless tobacco: Never Used     Comment: quit smoking in 1979  . Alcohol use Yes     Comment: occasionally - once per week    FAMILY HISTORY: Family History  Problem Relation Age of Onset  . Prostate cancer Father 5  . Heart disease Father   . Skin cancer Father   . Heart attack Father   . Cancer Father   . Deep vein thrombosis Father   . Hyperlipidemia Father   . Hypertension Father   . Heart disease Mother   . Skin cancer Mother   . Heart attack Mother   . Cancer Mother   . Hyperlipidemia Mother   . Hypertension Mother   . Varicose Veins Mother   . Peripheral vascular disease Mother        amputation  . Sleep apnea Brother   . Hyperlipidemia Brother   . Hypertension Brother   . Colon cancer Other        aunt  . Skin cancer Brother   . Skin cancer Sister   . Cancer Sister   . Heart disease Sister   . Diabetes Neg Hx   . Stroke Neg Hx     ROS: Review of Systems  Constitutional: Positive for weight loss.  Gastrointestinal: Negative for nausea.  Endo/Heme/Allergies:       Positive hypoglycemia    PHYSICAL EXAM: Blood pressure 138/77, pulse 63, temperature 97.9 F (36.6 C), temperature source Oral,  height 5\' 9"  (1.753 m), weight 206 lb (93.4 kg), SpO2 98 %. Body mass index is 30.42 kg/m. Physical Exam  Constitutional: He is oriented to person, place, and time. He appears well-developed and well-nourished.  Cardiovascular: Normal rate.   Pulmonary/Chest: Effort normal.  Musculoskeletal: Normal range of motion.  Neurological: He is oriented to person, place, and  time.  Skin: Skin is warm and dry.  Psychiatric: He has a normal mood and affect. His behavior is normal.  Vitals reviewed.   RECENT LABS AND TESTS: BMET    Component Value Date/Time   NA 142 01/18/2017 0938   K 4.5 01/18/2017 0938   CL 100 01/18/2017 0938   CO2 26 01/18/2017 0938   GLUCOSE 88 01/18/2017 0938   GLUCOSE 107 (H) 12/07/2016 0852   BUN 17 01/18/2017 0938   CREATININE 1.18 01/18/2017 0938   CREATININE 1.08 12/14/2015 1122   CALCIUM 10.3 (H) 01/18/2017 0938   GFRNONAA 60 01/18/2017 0938   GFRAA 69 01/18/2017 0938   Lab Results  Component Value Date   HGBA1C 5.7 (H) 04/19/2017   HGBA1C 5.9 (H) 01/18/2017   HGBA1C 6.1 12/07/2016   Lab Results  Component Value Date   INSULIN 17.3 04/19/2017   INSULIN 16.1 01/18/2017   CBC    Component Value Date/Time   WBC 4.7 01/18/2017 0938   WBC 5.6 12/07/2016 0852   RBC 4.76 01/18/2017 0938   RBC 4.72 12/07/2016 0852   HGB 15.0 01/18/2017 0938   HCT 43.9 01/18/2017 0938   PLT 185.0 12/07/2016 0852   PLT 156 04/07/2010   MCV 92 01/18/2017 0938   MCH 31.5 01/18/2017 0938   MCH 31.1 12/14/2015 1122   MCHC 34.2 01/18/2017 0938   MCHC 34.3 12/07/2016 0852   RDW 13.1 01/18/2017 0938   LYMPHSABS 2.0 01/18/2017 0938   MONOABS 0.4 12/07/2016 0852   EOSABS 0.1 01/18/2017 0938   BASOSABS 0.0 01/18/2017 0938   Iron/TIBC/Ferritin/ %Sat No results found for: IRON, TIBC, FERRITIN, IRONPCTSAT Lipid Panel     Component Value Date/Time   CHOL 118 04/19/2017 0818   TRIG 81 04/19/2017 0818   TRIG 256 09/14/2009 0000   HDL 42 04/19/2017 0818   CHOLHDL 3  12/07/2016 0852   VLDL 30.8 12/07/2016 0852   LDLCALC 60 04/19/2017 0818   LDLDIRECT 68.0 04/13/2015 0823   Hepatic Function Panel     Component Value Date/Time   PROT 7.0 01/18/2017 0938   ALBUMIN 4.7 01/18/2017 0938   AST 32 01/18/2017 0938   ALT 27 01/18/2017 0938   ALKPHOS 61 01/18/2017 0938   BILITOT 0.5 01/18/2017 0938      Component Value Date/Time   TSH 5.110 (H) 04/19/2017 0818   TSH 4.960 (H) 01/18/2017 0938   TSH 4.36 12/07/2016 0852    ASSESSMENT AND PLAN: Prediabetes  Class 1 obesity with serious comorbidity and body mass index (BMI) of 30.0 to 30.9 in adult, unspecified obesity type  PLAN:  Pre-Diabetes Philip [name] will continue to work on weight loss, exercise, and decreasing simple carbohydrates in his diet to help decrease the risk of diabetes. He was informed that eating too many simple carbohydrates or too many calories at one sitting increases the likelihood of GI side effects. Philip [name] agreed to follow up with our clinic in 3 to 4 weeks to monitor his progress. We will check labs at next visit where Philip [name] agrees to arrive fasting.  We spent > than 50% of the 15 minute visit on the counseling as documented in the note.  Obesity Philip [name] is currently in the action stage of change. As such, his goal is to continue with weight loss efforts He has agreed to keep a food journal with 500 to 600 calories and 40+ grams of protein at supper daily and follow the Category 3 plan Philip [name] has been instructed to work up to  a goal of 150 minutes of combined cardio and strengthening exercise per week for weight loss and overall health benefits. We discussed the following Behavioral Modification Strategies today: increasing lean protein intake, decreasing simple carbohydrates , dealing with family or coworker sabotage and travel eating strategies   Philip [name] has agreed to follow up with our clinic in 3 to 4 weeks fasting. He was informed of the importance of frequent follow up visits  to maximize his success with intensive lifestyle modifications for his multiple health conditions.  I, Doreene Nest, am acting as transcriptionist for Philip Nip, MD  I have reviewed the above documentation for accuracy and completeness, and I agree with the above. -Philip Nip, MD    OBESITY BEHAVIORAL INTERVENTION VISIT  Today's visit was # 10 out of 22.  Starting weight: 230 lbs Starting date: 01/18/17 Today's weight : 206 lbs Today's date: 07/24/2017 Total lbs lost to date: 24 (Patients must lose 7 lbs in the first 6 months to continue with counseling)   ASK: We discussed the diagnosis of obesity with Philip [name] today and Philip [name] agreed to give [place] permission to discuss obesity behavioral modification therapy today.  ASSESS: Jonatan has the diagnosis of obesity and his BMI today is 30.41 Ivie is in the action stage of change   ADVISE: Kinta was educated on the multiple health risks of obesity as well as the benefit of weight loss to improve his health. He was advised of the need for long term treatment and the importance of lifestyle modifications.  AGREE: Multiple dietary modification options and treatment options were discussed and  Mannie agreed to keep a food journal with 500 to 600 calories and 40+ grams of protein at supper daily and follow the Category 3 plan We discussed the following Behavioral Modification Strategies today: increasing lean protein intake, decreasing simple carbohydrates , dealing with family or coworker sabotage and travel eating strategies

## 2017-07-25 ENCOUNTER — Other Ambulatory Visit: Payer: Self-pay | Admitting: Internal Medicine

## 2017-07-25 DIAGNOSIS — R5383 Other fatigue: Secondary | ICD-10-CM

## 2017-07-25 DIAGNOSIS — R0609 Other forms of dyspnea: Secondary | ICD-10-CM

## 2017-07-25 DIAGNOSIS — I2510 Atherosclerotic heart disease of native coronary artery without angina pectoris: Secondary | ICD-10-CM

## 2017-07-25 NOTE — Telephone Encounter (Signed)
Refill Request.  

## 2017-07-28 ENCOUNTER — Other Ambulatory Visit: Payer: Self-pay | Admitting: Internal Medicine

## 2017-07-28 DIAGNOSIS — I2510 Atherosclerotic heart disease of native coronary artery without angina pectoris: Secondary | ICD-10-CM

## 2017-07-28 NOTE — Telephone Encounter (Signed)
Pt last saw Dr End on 06/16/17, last labs 01/18/17 Creat 1.18, age 76, weight 93.4kg, based on specified criteria pt is on appropriate dosage of Eliquis 5mg  BID.  Will refill rx.

## 2017-07-28 NOTE — Telephone Encounter (Signed)
Please review for refill. Thanks!  

## 2017-07-31 ENCOUNTER — Ambulatory Visit: Payer: Medicare HMO | Admitting: Internal Medicine

## 2017-08-04 ENCOUNTER — Ambulatory Visit: Payer: Medicare HMO | Admitting: Internal Medicine

## 2017-08-14 ENCOUNTER — Ambulatory Visit (INDEPENDENT_AMBULATORY_CARE_PROVIDER_SITE_OTHER): Payer: Medicare HMO | Admitting: Physician Assistant

## 2017-08-14 VITALS — BP 128/67 | HR 58 | Temp 97.8°F | Ht 69.0 in | Wt 206.0 lb

## 2017-08-14 DIAGNOSIS — E66811 Obesity, class 1: Secondary | ICD-10-CM

## 2017-08-14 DIAGNOSIS — R7989 Other specified abnormal findings of blood chemistry: Secondary | ICD-10-CM

## 2017-08-14 DIAGNOSIS — I10 Essential (primary) hypertension: Secondary | ICD-10-CM

## 2017-08-14 DIAGNOSIS — E782 Mixed hyperlipidemia: Secondary | ICD-10-CM

## 2017-08-14 DIAGNOSIS — R7303 Prediabetes: Secondary | ICD-10-CM

## 2017-08-14 DIAGNOSIS — E669 Obesity, unspecified: Secondary | ICD-10-CM | POA: Diagnosis not present

## 2017-08-14 DIAGNOSIS — Z6830 Body mass index (BMI) 30.0-30.9, adult: Secondary | ICD-10-CM

## 2017-08-14 DIAGNOSIS — E559 Vitamin D deficiency, unspecified: Secondary | ICD-10-CM | POA: Diagnosis not present

## 2017-08-14 NOTE — Progress Notes (Signed)
Office: [phone]  /  Fax: [phone]   HPI:   Chief Complaint: OBESITY Philip [name] is here to discuss his progress with his obesity treatment plan. He is on the Category 3 plan and is following his eating plan approximately 75 % of the time. He states he is exercising 0 minutes 0 times per week. Philip [name]  His weight is 206 lb (93.4 kg) today and has maintained weight over a period of 3 weeks since his last visit. He has lost 24 lbs since starting treatment with [place].  Vitamin D deficiency Philip [name] has a diagnosis of vitamin D deficiency. He is not currently taking prescription vit D and denies nausea, vomiting or muscle weakness.  Pre-Diabetes Philip [name] has a diagnosis of pre-diabetes based on his elevated Hgb A1c and was informed this puts him at greater risk of developing diabetes. He is not taking metformin currently and continues to work on diet and exercise to decrease risk of diabetes. He denies nausea or hypoglycemia.  Mixed Hyperlipidemia Philip [name] has hyperlipidemia and has been trying to improve his cholesterol levels with intensive lifestyle modification including a low saturated fat diet, exercise and weight loss. He denies any chest pain, claudication or myalgias.  Hypertension Philip [name] is a 76 y.o. male with hypertension. Philip [name] denies chest pain or shortness of breath on exertion. He is working weight loss to help control his blood pressure with the goal of decreasing his risk of heart attack and stroke. Philip [name] blood pressure is currently stable.  Elevated Philip [name] Philip [name] has been elevated on multiple occasions with normal T3/T4. He complains of cold intolerance, constipation, fatigue, dry skin, and hair thinning.     ALLERGIES: No Known Allergies  MEDICATIONS: Current Outpatient Prescriptions on File Prior to Visit  Medication Sig Dispense Refill  . amLODipine (NORVASC) 10 MG tablet Take 0.5 tablets (5 mg total) by mouth daily.    Marland Kitchen atorvastatin (LIPITOR) 40 MG  tablet Take 1 tablet (40 mg total) by mouth at bedtime. 90 tablet 2  . B Complex-Biotin-FA (B-COMPLEX PO) Take 1 tablet by mouth daily.    . Cholecalciferol (VITAMIN D3) 5000 UNITS CAPS Take 5,000 Units by mouth daily.    Marland Kitchen co-enzyme Q-10 30 MG capsule Take 30 mg by mouth daily.    Marland Kitchen ELIQUIS 5 MG TABS tablet TAKE 1 TABLET TWICE DAILY 180 tablet 1  . fenofibrate (TRICOR) 48 MG tablet Take 1 tablet (48 mg total) by mouth daily. 90 tablet 1  . hydrocortisone 2.5 % cream Apply 1 application topically as needed.    Marland Kitchen lisinopril (PRINIVIL,ZESTRIL) 10 MG tablet Take 1 tablet (10 mg total) by mouth daily. 90 tablet 3  . magnesium oxide (MAG-OX) 400 MG tablet Take 800 mg by mouth daily.    . Multiple Vitamins-Minerals (MULTIVITAMIN WITH MINERALS) tablet Take 1 tablet by mouth daily.    . Omega-3 Fatty Acids (FISH OIL) 1200 MG CAPS Take 1,200 mg by mouth daily.    . polyethylene glycol (MIRALAX / GLYCOLAX) packet Take 17 g by mouth daily.    . metoprolol succinate (TOPROL-XL) 50 MG 24 hr tablet Take 1 tablet (50 mg total) by mouth daily. Take with or immediately following a meal. 90 tablet 1   Current Facility-Administered Medications on File Prior to Visit  Medication Dose Route Frequency Provider Last Rate Last Dose  . triamcinolone acetonide (KENALOG) 10 MG/ML injection 10 mg  10 mg Other Once Trula Slade, DPM        PAST  MEDICAL HISTORY: Past Medical History:  Diagnosis Date  . AAA (abdominal aortic aneurysm) ([place])   . Atrial fibrillation ([place])   . CAD (coronary artery disease)    had a MI , s/p CABG  . Cataracts, bilateral    immature  . CHF (congestive heart failure) ([place])   . Dysrhythmia    paroximal a fib  . GERD (gastroesophageal reflux disease)    takes Omeprazole daily  . History of colon polyps   . History of kidney stones   . History of shingles   . Hyperlipidemia   . Hypertension    takes Amlodipine,Metoprolol,and Lisinopril daily  . Joint pain   . Joint swelling     . Myocardial infarction [facility])    several with last one being 2001(but never knew about any except 2001)  . OSA (obstructive sleep apnea)    started CPAP 12/09  . Peripheral vascular disease ([place])   . Pneumonia    as a child  . Skin cancer    several , one of them was melanoma (sees derm routinely)  . Tingling    feet occasionally  . Tinnitus     PAST SURGICAL HISTORY: Past Surgical History:  Procedure Laterality Date  . ABDOMINAL AORTAGRAM N/A 06/16/2014   Procedure: ABDOMINAL Maxcine Ham;  Surgeon: Angelia Mould, MD;  Location: [facility] CATH LAB;  Service: Cardiovascular;  Laterality: N/A;  . ABDOMINAL AORTIC ENDOVASCULAR STENT GRAFT N/A 07/08/2014   Procedure: ABDOMINAL AORTIC ENDOVASCULAR STENT GRAFT/ GORE;  Surgeon: Angelia Mould, MD;  Location: [facility];  Service: Vascular;  Laterality: N/A;  . CARDIAC CATHETERIZATION  2015  . COLONOSCOPY    . CORONARY ARTERY BYPASS GRAFT  1999   x 3 vessels  . LEFT HEART CATHETERIZATION WITH CORONARY/GRAFT ANGIOGRAM N/A 05/29/2014   Procedure: LEFT HEART CATHETERIZATION WITH [name];  Surgeon: [name], MD;  Location[facility] CATH LAB;  Service: Cardiovascular;  Laterality: N/A;  . PENILE PROSTHESIS IMPLANT  08/2005  . RHINOPLASTY  1975    SOCIAL HISTORY: Social History  Substance Use Topics  . Smoking status: Former Research scientist (life sciences)  . Smokeless tobacco: Never Used     Comment: quit smoking in 1979  . Alcohol use Yes     Comment: occasionally - once per week    FAMILY HISTORY: Family History  Problem Relation Age of Onset  . Prostate cancer Father 34  . Heart disease Father   . Skin cancer Father   . Heart attack Father   . Cancer Father   . Deep vein thrombosis Father   . Hyperlipidemia Father   . Hypertension Father   . Heart disease Mother   . Skin cancer Mother   . Heart attack Mother   . Cancer Mother   . Hyperlipidemia Mother   . Hypertension Mother   . Varicose Veins Mother   . Peripheral vascular  disease Mother        amputation  . Sleep apnea Brother   . Hyperlipidemia Brother   . Hypertension Brother   . Colon cancer Other        aunt  . Skin cancer Brother   . Skin cancer Sister   . Cancer Sister   . Heart disease Sister   . Diabetes Neg Hx   . Stroke Neg Hx     ROS: Review of Systems  Constitutional: Negative for weight loss.  Respiratory: Negative for shortness of breath (on exertion).   Cardiovascular: Negative for chest pain and claudication.  Gastrointestinal: Negative for nausea and vomiting.  Musculoskeletal: Negative for myalgias.       Negative muscle weakness  Endo/Heme/Allergies:       Negative hypoglycemia Negative polyphagia    PHYSICAL EXAM: Blood pressure 128/67, pulse (!) 58, temperature 97.8 F (36.6 C), temperature source Oral, height 5\' 9"  (1.753 m), weight 206 lb (93.4 kg), SpO2 98 %. Body mass index is 30.42 kg/m. Physical Exam  Constitutional: He is oriented to person, place, and time. He appears well-developed and well-nourished.  Cardiovascular: Normal rate.   Pulmonary/Chest: Effort normal.  Musculoskeletal: Normal range of motion.  Neurological: He is oriented to person, place, and time.  Skin: Skin is warm and dry.  Psychiatric: He has a normal mood and affect. His behavior is normal.  Vitals reviewed.   RECENT LABS AND TESTS: BMET    Component Value Date/Time   NA 142 01/18/2017 0938   K 4.5 01/18/2017 0938   CL 100 01/18/2017 0938   CO2 26 01/18/2017 0938   GLUCOSE 88 01/18/2017 0938   GLUCOSE 107 (H) 12/07/2016 0852   BUN 17 01/18/2017 0938   CREATININE 1.18 01/18/2017 0938   CREATININE 1.08 12/14/2015 1122   CALCIUM 10.3 (H) 01/18/2017 0938   GFRNONAA 60 01/18/2017 0938   GFRAA 69 01/18/2017 0938   Lab Results  Component Value Date   HGBA1C 5.7 (H) 04/19/2017   HGBA1C 5.9 (H) 01/18/2017   HGBA1C 6.1 12/07/2016   Lab Results  Component Value Date   INSULIN 17.3 04/19/2017   INSULIN 16.1 01/18/2017   CBC     Component Value Date/Time   WBC 4.7 01/18/2017 0938   WBC 5.6 12/07/2016 0852   RBC 4.76 01/18/2017 0938   RBC 4.72 12/07/2016 0852   HGB 15.0 01/18/2017 0938   HCT 43.9 01/18/2017 0938   PLT 185.0 12/07/2016 0852   PLT 156 04/07/2010   MCV 92 01/18/2017 0938   MCH 31.5 01/18/2017 0938   MCH 31.1 12/14/2015 1122   MCHC 34.2 01/18/2017 0938   MCHC 34.3 12/07/2016 0852   RDW 13.1 01/18/2017 0938   LYMPHSABS 2.0 01/18/2017 0938   MONOABS 0.4 12/07/2016 0852   EOSABS 0.1 01/18/2017 0938   BASOSABS 0.0 01/18/2017 0938   Iron/TIBC/Ferritin/ %Sat No results found for: IRON, TIBC, FERRITIN, IRONPCTSAT Lipid Panel     Component Value Date/Time   CHOL 118 04/19/2017 0818   TRIG 81 04/19/2017 0818   TRIG 256 09/14/2009 0000   HDL 42 04/19/2017 0818   CHOLHDL 3 12/07/2016 0852   VLDL 30.8 12/07/2016 0852   LDLCALC 60 04/19/2017 0818   LDLDIRECT 68.0 04/13/2015 0823   Hepatic Function Panel     Component Value Date/Time   PROT 7.0 01/18/2017 0938   ALBUMIN 4.7 01/18/2017 0938   AST 32 01/18/2017 0938   ALT 27 01/18/2017 0938   ALKPHOS 61 01/18/2017 0938   BILITOT 0.5 01/18/2017 0938      Component Value Date/Time   Philip [name] 5.110 (H) 04/19/2017 0818   Philip [name] 4.960 (H) 01/18/2017 0938   Philip [name] 4.36 12/07/2016 0852    ASSESSMENT AND PLAN: Prediabetes - Plan: Comprehensive metabolic panel, Hemoglobin A1c, Insulin, random, Philip [name], T4, free, T3  Vitamin D deficiency - Plan: VITAMIN D 25 Hydroxy (Vit-D Deficiency, Fractures)  Mixed hyperlipidemia - Plan: Lipid Panel With LDL/HDL Ratio  Essential hypertension  Class 1 obesity with serious comorbidity and body mass index (BMI) of 30.0 to 30.9 in adult, unspecified obesity type  PLAN:  Vitamin D Deficiency  Philip [name] was informed that low vitamin D levels contributes to fatigue and are associated with obesity, breast, and colon cancer. We will check labs and will follow up for routine testing of vitamin D, at least 2-3 times per year.  Philip [name] agrees to follow up with our clinic in 3 weeks.  Pre-Diabetes Philip [name] will continue to work on weight loss, exercise, and decreasing simple carbohydrates in his diet to help decrease the risk of diabetes. We dicussed metformin including benefits and risks. He was informed that eating too many simple carbohydrates or too many calories at one sitting increases the likelihood of GI side effects. Montrice declined metformin for now and a prescription was not written today. Mylan agreed to follow up with [place] as directed to monitor his progress.  Mixed Hyperlipidemia Philip [name] was informed of the American Heart Association Guidelines emphasizing intensive lifestyle modifications as the first line treatment for hyperlipidemia. We discussed many lifestyle modifications today in depth, and Philip [name] will continue to work on decreasing saturated fats such as fatty red meat, butter and many fried foods. He will also increase vegetables and lean protein in his diet and continue to work on exercise and weight loss efforts. We will check labs and Philip [name] agrees to continue his medications as prescribed and will follow up with our clinic in 3 weeks.  Hypertension We discussed sodium restriction, working on healthy weight loss, and a regular exercise program as the means to achieve improved blood pressure control. Philip [name] agreed with this plan and agreed to follow up as directed. We will continue to monitor his blood pressure as well as his progress with the above lifestyle modifications. We will check labs and he will continue his medications as prescribed and will watch for signs of hypotension as he continues his lifestyle modifications.  Elevated Philip [name] Philip [name] was informed that his symptoms may be a sign of hypothyroidism. We will recheck labs today.  Obesity Philip [name] is currently in the action stage of change. As such, his goal is to continue with weight loss efforts He has agreed to follow the Category 3 plan Philip [name]  has been instructed to work up to a goal of 150 minutes of combined cardio and strengthening exercise per week for weight loss and overall health benefits. We discussed the following Behavioral Modification Strategies today: increasing lean protein intake and work on meal planning and easy cooking plans  Philip [name] has agreed to follow up with our clinic in 3 weeks. He was informed of the importance of frequent follow up visits to maximize his success with intensive lifestyle modifications for his multiple health conditions.  I, Philip [name], am acting as transcriptionist for Philip Duverney, PA-C  I have reviewed the above documentation for accuracy and completeness, and I agree with the above. -Philip Duverney, PA-C  I have reviewed the above note and agree with the plan. -[name], MD   OBESITY BEHAVIORAL INTERVENTION VISIT  Today's visit was # 11 out of 22.  Starting weight: 230 lbs Starting date: 01/18/17 Today's weight : 206 lbs Today's date: 08/14/2017 Total lbs lost to date: 24 (Patients must lose 7 lbs in the first 6 months to continue with counseling)   ASK: We discussed the diagnosis of obesity with [name] today and Ruston agreed to give [place] permission to discuss obesity behavioral modification therapy today.  ASSESS: Edoardo has the diagnosis of obesity and his BMI today is 30.41 Daivik is in the action stage of change   ADVISE: Maleek was educated on  the multiple health risks of obesity as well as the benefit of weight loss to improve his health. He was advised of the need for long term treatment and the importance of lifestyle modifications.  AGREE: Multiple dietary modification options and treatment options were discussed and  Lexie agreed to follow the Category 3 plan We discussed the following Behavioral Modification Strategies today: increasing lean protein intake and work on meal planning and easy cooking plans

## 2017-08-15 LAB — COMPREHENSIVE METABOLIC PANEL
ALT: 19 IU/L (ref 0–44)
AST: 22 IU/L (ref 0–40)
Albumin/Globulin Ratio: 2.3 — ABNORMAL HIGH (ref 1.2–2.2)
Albumin: 4.6 g/dL (ref 3.5–4.8)
Alkaline Phosphatase: 61 IU/L (ref 39–117)
BUN/Creatinine Ratio: 16 (ref 10–24)
BUN: 17 mg/dL (ref 8–27)
Bilirubin Total: 0.6 mg/dL (ref 0.0–1.2)
CALCIUM: 9.5 mg/dL (ref 8.6–10.2)
CO2: 26 mmol/L (ref 20–29)
Chloride: 104 mmol/L (ref 96–106)
Creatinine, Ser: 1.05 mg/dL (ref 0.76–1.27)
GFR calc Af Amer: 79 mL/min/{1.73_m2} (ref >59)
GFR, EST NON AFRICAN AMERICAN: 68 mL/min/{1.73_m2} (ref >59)
GLUCOSE: 95 mg/dL (ref 65–99)
Globulin, Total: 2 g/dL (ref 1.5–4.5)
POTASSIUM: 4.6 mmol/L (ref 3.5–5.2)
Sodium: 144 mmol/L (ref 134–144)
Total Protein: 6.6 g/dL (ref 6.0–8.5)

## 2017-08-15 LAB — LIPID PANEL WITH LDL/HDL RATIO
Cholesterol, Total: 113 mg/dL (ref 100–199)
HDL: 42 mg/dL (ref >39)
LDL Calculated: 47 mg/dL (ref 0–99)
LDl/HDL Ratio: 1.1 ratio (ref 0.0–3.6)
Triglycerides: 121 mg/dL (ref 0–149)
VLDL Cholesterol Cal: 24 mg/dL (ref 5–40)

## 2017-08-15 LAB — TSH: TSH: 4.88 u[IU]/mL — ABNORMAL HIGH (ref 0.450–4.500)

## 2017-08-15 LAB — T3: T3 TOTAL: 89 ng/dL (ref 71–180)

## 2017-08-15 LAB — HEMOGLOBIN A1C
ESTIMATED AVERAGE GLUCOSE: 114 mg/dL
Hgb A1c MFr Bld: 5.6 % (ref 4.8–5.6)

## 2017-08-15 LAB — INSULIN, RANDOM: INSULIN: 10.8 u[IU]/mL (ref 2.6–24.9)

## 2017-08-15 LAB — VITAMIN D 25 HYDROXY (VIT D DEFICIENCY, FRACTURES): VIT D 25 HYDROXY: 52.7 ng/mL (ref 30.0–100.0)

## 2017-08-15 LAB — T4, FREE: Free T4: 1.06 ng/dL (ref 0.82–1.77)

## 2017-08-16 ENCOUNTER — Ambulatory Visit (INDEPENDENT_AMBULATORY_CARE_PROVIDER_SITE_OTHER): Payer: Medicare HMO | Admitting: Family Medicine

## 2017-08-25 ENCOUNTER — Ambulatory Visit: Payer: Medicare HMO | Admitting: Internal Medicine

## 2017-09-01 ENCOUNTER — Ambulatory Visit (INDEPENDENT_AMBULATORY_CARE_PROVIDER_SITE_OTHER): Payer: Medicare HMO

## 2017-09-01 DIAGNOSIS — Z23 Encounter for immunization: Secondary | ICD-10-CM | POA: Diagnosis not present

## 2017-09-04 ENCOUNTER — Ambulatory Visit (INDEPENDENT_AMBULATORY_CARE_PROVIDER_SITE_OTHER): Payer: Medicare HMO | Admitting: Physician Assistant

## 2017-09-04 VITALS — BP 149/73 | HR 61 | Temp 97.9°F | Ht 69.0 in | Wt 210.0 lb

## 2017-09-04 DIAGNOSIS — E669 Obesity, unspecified: Secondary | ICD-10-CM | POA: Diagnosis not present

## 2017-09-04 DIAGNOSIS — Z6831 Body mass index (BMI) 31.0-31.9, adult: Secondary | ICD-10-CM

## 2017-09-04 DIAGNOSIS — E039 Hypothyroidism, unspecified: Secondary | ICD-10-CM

## 2017-09-04 DIAGNOSIS — I10 Essential (primary) hypertension: Secondary | ICD-10-CM

## 2017-09-04 DIAGNOSIS — Z9189 Other specified personal risk factors, not elsewhere classified: Secondary | ICD-10-CM | POA: Diagnosis not present

## 2017-09-04 DIAGNOSIS — E038 Other specified hypothyroidism: Secondary | ICD-10-CM

## 2017-09-04 MED ORDER — LEVOTHYROXINE SODIUM 25 MCG PO TABS: 25.0000 ug | ORAL_TABLET | Freq: Every day | ORAL | 0 refills | Status: DC

## 2017-09-04 NOTE — Progress Notes (Signed)
Office: [phone]  /  Fax: [phone]   HPI:   Chief Complaint: OBESITY Philip [name] is here to discuss his progress with his obesity treatment plan. He is on the Category 3 plan and is following his eating plan approximately 75 % of the time. He states he is exercising 0 minutes 0 times per week. Philip [name] is retaining fluids. He continues to control his portions and is mindful of his eating. He would like to add variety to his meals and would like more meal planning ideas. His weight is 210 lb (95.3 kg) today and has had a weight gain of 4 pounds over a period of 3 weeks since his last visit. He has lost 20 lbs since starting treatment with [place].  Subclinical Hypothyroidism Philip [name] has a new diagnosis of hypothyroidism. TSH is elevated and his T3,T4 are within normal limits. He is not on levothyroxine. He admits to constipation and fatigue. He denies palpitations, or heat/cold intolerance.  Hypertension Philip [name] is a 76 y.o. male with hypertension. His blood pressure is elevated today and he states his blood pressure is usually stable at home. Philip [name] denies chest pain or shortness of breath on exertion. He is working weight loss to help control his blood pressure with the goal of decreasing his risk of heart attack and stroke. Philip [name] blood pressure is not currently controlled.  ALLERGIES: No Known Allergies  MEDICATIONS: Current Outpatient Medications on File Prior to Visit  Medication Sig Dispense Refill  . amLODipine (NORVASC) 10 MG tablet Take 0.5 tablets (5 mg total) by mouth daily.    Marland Kitchen atorvastatin (LIPITOR) 40 MG tablet Take 1 tablet (40 mg total) by mouth at bedtime. 90 tablet 2  . B Complex-Biotin-FA (B-COMPLEX PO) Take 1 tablet by mouth daily.    . Cholecalciferol (VITAMIN D3) 5000 UNITS CAPS Take 5,000 Units by mouth daily.    Marland Kitchen co-enzyme Q-10 30 MG capsule Take 30 mg by mouth daily.    Marland Kitchen ELIQUIS 5 MG TABS tablet TAKE 1 TABLET TWICE DAILY 180 tablet 1  .  fenofibrate (TRICOR) 48 MG tablet Take 1 tablet (48 mg total) by mouth daily. 90 tablet 1  . hydrocortisone 2.5 % cream Apply 1 application topically as needed.    Marland Kitchen lisinopril (PRINIVIL,ZESTRIL) 10 MG tablet Take 1 tablet (10 mg total) by mouth daily. 90 tablet 3  . magnesium oxide (MAG-OX) 400 MG tablet Take 800 mg by mouth daily.    . Melatonin 10 MG CAPS Take 10 mg at bedtime by mouth.    . Multiple Vitamins-Minerals (MULTIVITAMIN WITH MINERALS) tablet Take 1 tablet by mouth daily.    . Omega-3 Fatty Acids (FISH OIL) 1200 MG CAPS Take 1,200 mg by mouth daily.    . polyethylene glycol (MIRALAX / GLYCOLAX) packet Take 17 g by mouth daily.    . metoprolol succinate (TOPROL-XL) 50 MG 24 hr tablet Take 1 tablet (50 mg total) by mouth daily. Take with or immediately following a meal. 90 tablet 1   Current Facility-Administered Medications on File Prior to Visit  Medication Dose Route Frequency Provider Last Rate Last Dose  . triamcinolone acetonide (KENALOG) 10 MG/ML injection 10 mg  10 mg Other Once Trula Slade, DPM        PAST MEDICAL HISTORY: Past Medical History:  Diagnosis Date  . AAA (abdominal aortic aneurysm) ([place])   . Atrial fibrillation ([place])   . CAD (coronary artery disease)    had a MI , s/p CABG  .  Cataracts, bilateral    immature  . CHF (congestive heart failure) ([place])   . Dysrhythmia    paroximal a fib  . GERD (gastroesophageal reflux disease)    takes Omeprazole daily  . History of colon polyps   . History of kidney stones   . History of shingles   . Hyperlipidemia   . Hypertension    takes Amlodipine,Metoprolol,and Lisinopril daily  . Joint pain   . Joint swelling   . Myocardial infarction [facility])    several with last one being 2001(but never knew about any except 2001)  . OSA (obstructive sleep apnea)    started CPAP 12/09  . Peripheral vascular disease ([place])   . Pneumonia    as a child  . Skin cancer    several , one of them was melanoma (sees derm  routinely)  . Tingling    feet occasionally  . Tinnitus     PAST SURGICAL HISTORY: Past Surgical History:  Procedure Laterality Date  . CARDIAC CATHETERIZATION  2015  . COLONOSCOPY    . CORONARY ARTERY BYPASS GRAFT  1999   x 3 vessels  . PENILE PROSTHESIS IMPLANT  08/2005  . RHINOPLASTY  1975    SOCIAL HISTORY: Social History   Tobacco Use  . Smoking status: Former Research scientist (life sciences)  . Smokeless tobacco: Never Used  . Tobacco comment: quit smoking in 1979  Substance Use Topics  . Alcohol use: Yes    Comment: occasionally - once per week  . Drug use: No    FAMILY HISTORY: Family History  Problem Relation Age of Onset  . Prostate cancer Father 64  . Heart disease Father   . Skin cancer Father   . Heart attack Father   . Cancer Father   . Deep vein thrombosis Father   . Hyperlipidemia Father   . Hypertension Father   . Heart disease Mother   . Skin cancer Mother   . Heart attack Mother   . Cancer Mother   . Hyperlipidemia Mother   . Hypertension Mother   . Varicose Veins Mother   . Peripheral vascular disease Mother        amputation  . Sleep apnea Brother   . Hyperlipidemia Brother   . Hypertension Brother   . Colon cancer Other        aunt  . Skin cancer Brother   . Skin cancer Sister   . Cancer Sister   . Heart disease Sister   . Diabetes Neg Hx   . Stroke Neg Hx     ROS: Review of Systems  Constitutional: Positive for malaise/fatigue. Negative for weight loss.  Respiratory: Negative for shortness of breath (on exertion).   Cardiovascular: Negative for chest pain and palpitations.  Gastrointestinal: Positive for constipation.  Endo/Heme/Allergies:       Negative heat/cold intolerance    PHYSICAL EXAM: Blood pressure (!) 149/73, pulse 61, temperature 97.9 F (36.6 C), temperature source Oral, height 5\' 9"  (1.753 m), weight 210 lb (95.3 kg), SpO2 98 %. Body mass index is 31.01 kg/m. Physical Exam  Constitutional: He is oriented to person, place, and  time. He appears well-developed and well-nourished.  Cardiovascular: Normal rate.  Pulmonary/Chest: Effort normal.  Musculoskeletal: Normal range of motion.  Neurological: He is oriented to person, place, and time.  Skin: Skin is warm and dry.  Psychiatric: He has a normal mood and affect. His behavior is normal.  Vitals reviewed.   RECENT LABS AND TESTS: BMET    Component  Value Date/Time   NA 144 08/14/2017 0913   K 4.6 08/14/2017 0913   CL 104 08/14/2017 0913   CO2 26 08/14/2017 0913   GLUCOSE 95 08/14/2017 0913   GLUCOSE 107 (H) 12/07/2016 0852   BUN 17 08/14/2017 0913   CREATININE 1.05 08/14/2017 0913   CREATININE 1.08 12/14/2015 1122   CALCIUM 9.5 08/14/2017 0913   GFRNONAA 68 08/14/2017 0913   GFRAA 79 08/14/2017 0913   Lab Results  Component Value Date   HGBA1C 5.6 08/14/2017   HGBA1C 5.7 (H) 04/19/2017   HGBA1C 5.9 (H) 01/18/2017   HGBA1C 6.1 12/07/2016   Lab Results  Component Value Date   INSULIN 10.8 08/14/2017   INSULIN 17.3 04/19/2017   INSULIN 16.1 01/18/2017   CBC    Component Value Date/Time   WBC 4.7 01/18/2017 0938   WBC 5.6 12/07/2016 0852   RBC 4.76 01/18/2017 0938   RBC 4.72 12/07/2016 0852   HGB 15.0 01/18/2017 0938   HCT 43.9 01/18/2017 0938   PLT 185.0 12/07/2016 0852   PLT 156 04/07/2010   MCV 92 01/18/2017 0938   MCH 31.5 01/18/2017 0938   MCH 31.1 12/14/2015 1122   MCHC 34.2 01/18/2017 0938   MCHC 34.3 12/07/2016 0852   RDW 13.1 01/18/2017 0938   LYMPHSABS 2.0 01/18/2017 0938   MONOABS 0.4 12/07/2016 0852   EOSABS 0.1 01/18/2017 0938   BASOSABS 0.0 01/18/2017 0938   Iron/TIBC/Ferritin/ %Sat No results found for: IRON, TIBC, FERRITIN, IRONPCTSAT Lipid Panel     Component Value Date/Time   CHOL 113 08/14/2017 0913   TRIG 121 08/14/2017 0913   TRIG 256 09/14/2009 0000   HDL 42 08/14/2017 0913   CHOLHDL 3 12/07/2016 0852   VLDL 30.8 12/07/2016 0852   LDLCALC 47 08/14/2017 0913   LDLDIRECT 68.0 04/13/2015 0823   Hepatic  Function Panel     Component Value Date/Time   PROT 6.6 08/14/2017 0913   ALBUMIN 4.6 08/14/2017 0913   AST 22 08/14/2017 0913   ALT 19 08/14/2017 0913   ALKPHOS 61 08/14/2017 0913   BILITOT 0.6 08/14/2017 0913      Component Value Date/Time   TSH 4.880 (H) 08/14/2017 0913   TSH 5.110 (H) 04/19/2017 0818   TSH 4.960 (H) 01/18/2017 0938    ASSESSMENT AND PLAN: Subclinical hypothyroidism - Plan: levothyroxine (SYNTHROID, LEVOTHROID) 25 MCG tablet  Essential hypertension  Class 1 obesity with serious comorbidity and body mass index (BMI) of 31.0 to 31.9 in adult, unspecified obesity type  PLAN:  Subclinical Hypothyroidism Philip [name] was informed of the importance of good thyroid control to help with weight loss efforts. He was also informed that supertheraputic thyroid levels are dangerous and will not improve weight loss results. Philip [name] understands that although his labs dont suggest a primary hypothyroidism, due to symptoms and persistent elevated TSH, he will be started on medicine. Labs will be rechecked in 6 months to assess response to medicine. Philip [name] agrees to start levothyroxine 25 mg qd before breakfast #30 with no refills and will follow up with our clinic in 2 weeks.   Hypertension We discussed sodium restriction, working on healthy weight loss, and a regular exercise program as the means to achieve improved blood pressure control. Philip [name] agreed with this plan and agreed to follow up as directed. We will continue to monitor his blood pressure as well as his progress with the above lifestyle modifications. He will continue his medications as prescribed and will watch for signs of hypotension as  he continues his lifestyle modifications. Philip [name] is encouraged to keep a blood pressure log and bring in for review.  Obesity Philip [name] is currently in the action stage of change. As such, his goal is to continue with weight loss efforts He has agreed to keep a food journal with 1500  calories and 90 grams of protein daily Philip [name] has been instructed to work up to a goal of 150 minutes of combined cardio and strengthening exercise per week for weight loss and overall health benefits. We discussed the following Behavioral Modification Strategies today: increasing lean protein intake and increasing fiber rich foods  Philip [name] has agreed to follow up with our clinic in 2 weeks. He was informed of the importance of frequent follow up visits to maximize his success with intensive lifestyle modifications for his multiple health conditions.  I, Philip [name], am acting as transcriptionist for Philip Duverney, PA-C  I have reviewed the above documentation for accuracy and completeness, and I agree with the above. -Philip Duverney, PA-C  I have reviewed the above note and agree with the plan. -Philip Nip, MD   OBESITY BEHAVIORAL INTERVENTION VISIT  Today's visit was # 12 out of 22.  Starting weight: 230 lbs Starting date: 01/18/17 Today's weight : 210 lbs  Today's date: 09/04/2017 Total lbs lost to date: 20 (Patients must lose 7 lbs in the first 6 months to continue with counseling)   ASK: We discussed the diagnosis of obesity with Philip [name] today and Philip [name] agreed to give [place] permission to discuss obesity behavioral modification therapy today.  ASSESS: Philip [name] has the diagnosis of obesity and his BMI today is 78 Philip [name] is in the action stage of change   ADVISE: Philip [name] was educated on the multiple health risks of obesity as well as the benefit of weight loss to improve his health. He was advised of the need for long term treatment and the importance of lifestyle modifications.  AGREE: Multiple dietary modification options and treatment options were discussed and  Philip [name] agreed to keep a food journal with 1500 calories and 90 grams of protein daily We discussed the following Behavioral Modification Strategies today: increasing lean protein intake and increasing fiber rich  foods

## 2017-09-06 ENCOUNTER — Encounter: Payer: Self-pay | Admitting: Adult Health

## 2017-09-06 ENCOUNTER — Ambulatory Visit: Payer: Medicare HMO | Admitting: Adult Health

## 2017-09-06 DIAGNOSIS — G4733 Obstructive sleep apnea (adult) (pediatric): Secondary | ICD-10-CM

## 2017-09-06 NOTE — Assessment & Plan Note (Signed)
Wt loss  

## 2017-09-06 NOTE — Assessment & Plan Note (Signed)
Well controlled on CPAP   Plan . Patient Instructions  Continue on CPAP at bedtime Keep up the good work Do not drive if sleepy Continue to work on a healthy weight Follow-up in 1 year with Dr. [name] and as needed

## 2017-09-06 NOTE — Patient Instructions (Signed)
Continue on CPAP at bedtime Keep up the good work Do not drive if sleepy Continue to work on a healthy weight Follow-up in 1 year with Dr. [name] and as needed

## 2017-09-06 NOTE — Progress Notes (Signed)
@Patient  ID: Philip [name], male    DOB: 1941/07/12, 76 y.o.   MRN: [id number]  Chief Complaint  Patient presents with  . Follow-up    OSA    Referring provider: Colon Branch, MD  HPI: 76 yo male followed for severe OSA on CPAP   TEST  PSG 07/2008 showed severe OSA with AHI 33 per hour lowest desaturation of 84% and PLM's. This was corrected by CPAP of 10 cm  09/06/2017 Follow up ; OSA  Pt returns for yearly follow up for OSA . He is on CPAP At bedtime  . Says he is doing well . Feels rested with no significant daytime sleepiness. Wears each night for around 6hr .  Download shows excellent compliance with average usage around 6.5 hours.  AHI 1.7.  Positive leaks.  Patient is on CPAP 10 cm H2O. Feels the nasal pillows is working well.  Discussed weight loss. Has lost 30lbs. Working on [employer]. Doristine Church weight management thru [facility].     No Known Allergies  Immunization History  Administered Date(s) Administered  . Influenza Split 09/15/2011, 09/14/2012  . Influenza Whole 08/06/2010  . Influenza, High Dose Seasonal PF 09/16/2015, 09/26/2016, 09/01/2017  . Influenza,inj,Quad PF,6+ Mos 10/01/2013  . Influenza-Unspecified 08/31/2014  . Pneumococcal Conjugate-13 04/22/2014  . Pneumococcal Polysaccharide-23 01/01/2010  . Td 04/22/2014  . Zoster 10/31/2012    Past Medical History:  Diagnosis Date  . AAA (abdominal aortic aneurysm) ([place])   . Atrial fibrillation ([place])   . CAD (coronary artery disease)    had a MI , s/p CABG  . Cataracts, bilateral    immature  . CHF (congestive heart failure) ([place])   . Dysrhythmia    paroximal a fib  . GERD (gastroesophageal reflux disease)    takes Omeprazole daily  . History of colon polyps   . History of kidney stones   . History of shingles   . Hyperlipidemia   . Hypertension    takes Amlodipine,Metoprolol,and Lisinopril daily  . Joint pain   . Joint swelling   . Myocardial infarction [facility])    several with last one  being 2001(but never knew about any except 2001)  . OSA (obstructive sleep apnea)    started CPAP 12/09  . Peripheral vascular disease ([place])   . Pneumonia    as a child  . Skin cancer    several , one of them was melanoma (sees derm routinely)  . Tingling    feet occasionally  . Tinnitus     Tobacco History: Social History   Tobacco Use  Smoking Status Former Smoker  . Types: Cigarettes  . Last attempt to quit: 1979  . Years since quitting: 39.8  Smokeless Tobacco Never Used  Tobacco Comment   quit smoking in 1979   Counseling given: Not Answered Comment: quit smoking in 1979   Outpatient Encounter Medications as of 09/06/2017  Medication Sig  . amLODipine (NORVASC) 10 MG tablet Take 0.5 tablets (5 mg total) by mouth daily.  Marland Kitchen atorvastatin (LIPITOR) 40 MG tablet Take 1 tablet (40 mg total) by mouth at bedtime.  . B Complex-Biotin-FA (B-COMPLEX PO) Take 1 tablet by mouth daily.  . Cholecalciferol (VITAMIN D3) 5000 UNITS CAPS Take 5,000 Units by mouth daily.  Marland Kitchen co-enzyme Q-10 30 MG capsule Take 30 mg by mouth daily.  Marland Kitchen ELIQUIS 5 MG TABS tablet TAKE 1 TABLET TWICE DAILY  . fenofibrate (TRICOR) 48 MG tablet Take 1 tablet (48 mg total)  by mouth daily.  . hydrocortisone 2.5 % cream Apply 1 application topically as needed.  Marland Kitchen levothyroxine (SYNTHROID, LEVOTHROID) 25 MCG tablet Take 1 tablet (25 mcg total) daily before breakfast by mouth.  Marland Kitchen lisinopril (PRINIVIL,ZESTRIL) 10 MG tablet Take 1 tablet (10 mg total) by mouth daily.  . magnesium oxide (MAG-OX) 400 MG tablet Take 800 mg by mouth daily.  . Melatonin 10 MG CAPS Take 10 mg at bedtime by mouth.  . Multiple Vitamins-Minerals (MULTIVITAMIN WITH MINERALS) tablet Take 1 tablet by mouth daily.  . Omega-3 Fatty Acids (FISH OIL) 1200 MG CAPS Take 1,200 mg by mouth daily.  . polyethylene glycol (MIRALAX / GLYCOLAX) packet Take 17 g by mouth daily.  . metoprolol succinate (TOPROL-XL) 50 MG 24 hr tablet Take 1 tablet (50 mg total) by  mouth daily. Take with or immediately following a meal.   Facility-Administered Encounter Medications as of 09/06/2017  Medication  . triamcinolone acetonide (KENALOG) 10 MG/ML injection 10 mg     Review of Systems  Constitutional:   No  weight loss, night sweats,  Fevers, chills, fatigue, or  lassitude.  HEENT:   No headaches,  Difficulty swallowing,  Tooth/dental problems, or  Sore throat,                No sneezing, itching, ear ache, nasal congestion, post nasal drip,   CV:  No chest pain,  Orthopnea, PND, swelling in lower extremities, anasarca, dizziness, palpitations, syncope.   GI  No heartburn, indigestion, abdominal pain, nausea, vomiting, diarrhea, change in bowel habits, loss of appetite, bloody stools.   Resp: No shortness of breath with exertion or at rest.  No excess mucus, no productive cough,  No non-productive cough,  No coughing up of blood.  No change in color of mucus.  No wheezing.  No chest wall deformity  Skin: no rash or lesions.  GU: no dysuria, change in color of urine, no urgency or frequency.  No flank pain, no hematuria   MS:  No joint pain or swelling.  No decreased range of motion.  No back pain.    Physical Exam  BP 120/78 (BP Location: Left Arm, Cuff Size: Normal)   Pulse 72   Ht 5\' 9"  (1.753 m)   Wt 212 lb 9.6 oz (96.4 kg)   SpO2 98%   BMI 31.40 kg/m   GEN: A/Ox3; pleasant , NAD, well nourished    HEENT:  [place]/AT,  EACs-clear, TMs-wnl, NOSE-clear, THROAT-clear, no lesions, no postnasal drip or exudate noted. Class 2-3 MP airway   NECK:  Supple w/ fair ROM; no JVD; normal carotid impulses w/o bruits; no thyromegaly or nodules palpated; no lymphadenopathy.    RESP  Clear  P & A; w/o, wheezes/ rales/ or rhonchi. no accessory muscle use, no dullness to percussion  CARD:  RRR, no m/r/g, no peripheral edema, pulses intact, no cyanosis or clubbing.  GI:   Soft & nt; nml bowel sounds; no organomegaly or masses detected.   Musco: Warm bil, no  deformities or joint swelling noted.   Neuro: alert, no focal deficits noted.    Skin: Warm, no lesions or rashes    Lab Results:  CBC    Component Value Date/Time   WBC 4.7 01/18/2017 0938   WBC 5.6 12/07/2016 0852   RBC 4.76 01/18/2017 0938   RBC 4.72 12/07/2016 0852   HGB 15.0 01/18/2017 0938   HCT 43.9 01/18/2017 0938   PLT 185.0 12/07/2016 0852   PLT 156 04/07/2010  MCV 92 01/18/2017 0938   MCH 31.5 01/18/2017 0938   MCH 31.1 12/14/2015 1122   MCHC 34.2 01/18/2017 0938   MCHC 34.3 12/07/2016 0852   RDW 13.1 01/18/2017 0938   LYMPHSABS 2.0 01/18/2017 0938   MONOABS 0.4 12/07/2016 0852   EOSABS 0.1 01/18/2017 0938   BASOSABS 0.0 01/18/2017 0938    BMET    Component Value Date/Time   NA 144 08/14/2017 0913   K 4.6 08/14/2017 0913   CL 104 08/14/2017 0913   CO2 26 08/14/2017 0913   GLUCOSE 95 08/14/2017 0913   GLUCOSE 107 (H) 12/07/2016 0852   BUN 17 08/14/2017 0913   CREATININE 1.05 08/14/2017 0913   CREATININE 1.08 12/14/2015 1122   CALCIUM 9.5 08/14/2017 0913   GFRNONAA 68 08/14/2017 0913   GFRAA 79 08/14/2017 0913    BNP No results found for: BNP  ProBNP No results found for: PROBNP  Imaging: No results found.   Assessment & Plan:   OSA (obstructive sleep apnea) Well controlled on CPAP   Plan . Patient Instructions  Continue on CPAP at bedtime Keep up the good work Do not drive if sleepy Continue to work on a healthy weight Follow-up in 1 year with Dr. [name] and as needed    Class 1 obesity without serious comorbidity with body mass index (BMI) of 30.0 to 30.9 in adult Wt loss      Rexene Edison, NP 09/06/2017

## 2017-09-11 ENCOUNTER — Telehealth: Payer: Self-pay | Admitting: Internal Medicine

## 2017-09-11 DIAGNOSIS — I2510 Atherosclerotic heart disease of native coronary artery without angina pectoris: Secondary | ICD-10-CM

## 2017-09-11 DIAGNOSIS — R0609 Other forms of dyspnea: Secondary | ICD-10-CM

## 2017-09-11 DIAGNOSIS — R5383 Other fatigue: Secondary | ICD-10-CM

## 2017-09-11 MED ORDER — AMLODIPINE BESYLATE 10 MG PO TABS: 5.0000 mg | ORAL_TABLET | Freq: Every day | ORAL | 0 refills | Status: DC

## 2017-09-11 NOTE — Telephone Encounter (Signed)
I would ask him to contact PCP if he is the one that changed the dose.

## 2017-09-11 NOTE — Telephone Encounter (Signed)
Patients dose was reduced to 5 mg by PCP. Okay to refill or should he contact PCP office? Please advise. Thanks, MI

## 2017-09-11 NOTE — Telephone Encounter (Signed)
AMLODIPINE 5 MG to [facility] per pt request.

## 2017-09-11 NOTE — Telephone Encounter (Signed)
Spoke with patient and made him aware of msg below. He apologized for the confusion and will call his PCP to request the refill. Patient thanked me for the return call.

## 2017-09-11 NOTE — Telephone Encounter (Signed)
°*  STAT* If patient is at the pharmacy, call can be transferred to refill team.   1. Which medications need to be refilled? (please list name of each medication and dose if known)Amlodipine 5mg  ( needs a new prescription sent for the dosage )  2. Which pharmacy/location (including street and city if local pharmacy) is medication to be sent to?[place]   3. Do they need a 30 day or 90 day supply? [place]

## 2017-09-11 NOTE — Telephone Encounter (Signed)
Rx sent 

## 2017-09-17 ENCOUNTER — Other Ambulatory Visit: Payer: Self-pay | Admitting: Internal Medicine

## 2017-09-17 DIAGNOSIS — R5383 Other fatigue: Secondary | ICD-10-CM

## 2017-09-17 DIAGNOSIS — I2510 Atherosclerotic heart disease of native coronary artery without angina pectoris: Secondary | ICD-10-CM

## 2017-09-17 DIAGNOSIS — R0609 Other forms of dyspnea: Secondary | ICD-10-CM

## 2017-09-18 ENCOUNTER — Ambulatory Visit (INDEPENDENT_AMBULATORY_CARE_PROVIDER_SITE_OTHER): Payer: Medicare HMO | Admitting: Physician Assistant

## 2017-09-18 ENCOUNTER — Encounter (INDEPENDENT_AMBULATORY_CARE_PROVIDER_SITE_OTHER): Payer: Self-pay | Admitting: Physician Assistant

## 2017-09-18 VITALS — BP 129/65 | HR 58 | Temp 97.7°F | Ht 69.0 in | Wt 208.0 lb

## 2017-09-18 DIAGNOSIS — I10 Essential (primary) hypertension: Secondary | ICD-10-CM | POA: Diagnosis not present

## 2017-09-18 DIAGNOSIS — Z6830 Body mass index (BMI) 30.0-30.9, adult: Secondary | ICD-10-CM

## 2017-09-18 DIAGNOSIS — E669 Obesity, unspecified: Secondary | ICD-10-CM

## 2017-09-18 NOTE — Progress Notes (Signed)
Office: [phone]  /  Fax: [phone]   HPI:   Chief Complaint: OBESITY Champ is here to discuss his progress with his obesity treatment plan. He is on the  keep a food journal with 1500 calories and 90 grams of protein daily and is following his eating plan approximately 25 % of the time. He states he is exercising 0 minutes 0 times per week. Kier continues to do well with weight loss. Theo would like more convenience meal options and he would like holiday eating strategies. His weight is 208 lb (94.3 kg) today and has had a weight loss of 2 pounds over a period of 2 weeks since his last visit. He has lost 22 lbs since starting treatment with [place].  Hypertension UNKNOWN SCHLEYER is a 76 y.o. male with hypertension.  GIBBS NAUGLE denies chest pain or shortness of breath on exertion. He is working weight loss to help control his blood pressure with the goal of decreasing his risk of heart attack and stroke. Roberts blood pressure is currently stable.  ALLERGIES: No Known Allergies  MEDICATIONS: Current Outpatient Medications on File Prior to Visit  Medication Sig Dispense Refill  . amLODipine (NORVASC) 10 MG tablet Take 0.5 tablets (5 mg total) daily by mouth. 45 tablet 0  . atorvastatin (LIPITOR) 40 MG tablet Take 1 tablet (40 mg total) by mouth at bedtime. 90 tablet 2  . B Complex-Biotin-FA (B-COMPLEX PO) Take 1 tablet by mouth daily.    . Cholecalciferol (VITAMIN D3) 5000 UNITS CAPS Take 5,000 Units by mouth daily.    Marland Kitchen co-enzyme Q-10 30 MG capsule Take 30 mg by mouth daily.    Marland Kitchen ELIQUIS 5 MG TABS tablet TAKE 1 TABLET TWICE DAILY 180 tablet 1  . fenofibrate (TRICOR) 48 MG tablet TAKE 1 TABLET EVERY DAY 90 tablet 2  . hydrocortisone 2.5 % cream Apply 1 application topically as needed.    Marland Kitchen levothyroxine (SYNTHROID, LEVOTHROID) 25 MCG tablet Take 1 tablet (25 mcg total) daily before breakfast by mouth. 30 tablet 0  . lisinopril (PRINIVIL,ZESTRIL) 10 MG tablet Take 1 tablet (10  mg total) by mouth daily. 90 tablet 3  . magnesium oxide (MAG-OX) 400 MG tablet Take 800 mg by mouth daily.    . Melatonin 10 MG CAPS Take 10 mg at bedtime by mouth.    . Multiple Vitamins-Minerals (MULTIVITAMIN WITH MINERALS) tablet Take 1 tablet by mouth daily.    . Omega-3 Fatty Acids (FISH OIL) 1200 MG CAPS Take 1,200 mg by mouth daily.    . polyethylene glycol (MIRALAX / GLYCOLAX) packet Take 17 g by mouth daily.    . metoprolol succinate (TOPROL-XL) 50 MG 24 hr tablet Take 1 tablet (50 mg total) by mouth daily. Take with or immediately following a meal. 90 tablet 1   Current Facility-Administered Medications on File Prior to Visit  Medication Dose Route Frequency Provider Last Rate Last Dose  . triamcinolone acetonide (KENALOG) 10 MG/ML injection 10 mg  10 mg Other Once Trula Slade, DPM        PAST MEDICAL HISTORY: Past Medical History:  Diagnosis Date  . AAA (abdominal aortic aneurysm) ([place])   . Atrial fibrillation ([place])   . CAD (coronary artery disease)    had a MI , s/p CABG  . Cataracts, bilateral    immature  . CHF (congestive heart failure) ([place])   . Dysrhythmia    paroximal a fib  . GERD (gastroesophageal reflux disease)  takes Omeprazole daily  . History of colon polyps   . History of kidney stones   . History of shingles   . Hyperlipidemia   . Hypertension    takes Amlodipine,Metoprolol,and Lisinopril daily  . Joint pain   . Joint swelling   . Myocardial infarction [facility])    several with last one being 2001(but never knew about any except 2001)  . OSA (obstructive sleep apnea)    started CPAP 12/09  . Peripheral vascular disease ([place])   . Pneumonia    as a child  . Skin cancer    several , one of them was melanoma (sees derm routinely)  . Tingling    feet occasionally  . Tinnitus     PAST SURGICAL HISTORY: Past Surgical History:  Procedure Laterality Date  . ABDOMINAL AORTAGRAM N/A 06/16/2014   Performed by Angelia Mould, MD at [facility]  CATH LAB  . ABDOMINAL AORTIC ENDOVASCULAR STENT GRAFT/ GORE N/A 07/08/2014   Performed by Angelia Mould, MD at [place] Bilateral 06/16/2014   Performed by Angelia Mould, MD at [facility] CATH LAB  . CARDIAC CATHETERIZATION  2015  . COLONOSCOPY    . CORONARY ARTERY BYPASS GRAFT  1999   x 3 vessels  . LEFT HEART CATHETERIZATION WITH CORONARY/GRAFT ANGIOGRAM N/A 05/29/2014   Performed by [name], MD at [facility] CATH LAB  . PENILE PROSTHESIS IMPLANT  08/2005  . RHINOPLASTY  1975    SOCIAL HISTORY: Social History   Tobacco Use  . Smoking status: Former Smoker    Types: Cigarettes    Last attempt to quit: 1979    Years since quitting: 39.9  . Smokeless tobacco: Never Used  . Tobacco comment: quit smoking in 1979  Substance Use Topics  . Alcohol use: Yes    Comment: occasionally - once per week  . Drug use: No    FAMILY HISTORY: Family History  Problem Relation Age of Onset  . Prostate cancer Father 39  . Heart disease Father   . Skin cancer Father   . Heart attack Father   . Cancer Father   . Deep vein thrombosis Father   . Hyperlipidemia Father   . Hypertension Father   . Heart disease Mother   . Skin cancer Mother   . Heart attack Mother   . Cancer Mother   . Hyperlipidemia Mother   . Hypertension Mother   . Varicose Veins Mother   . Peripheral vascular disease Mother        amputation  . Sleep apnea Brother   . Hyperlipidemia Brother   . Hypertension Brother   . Colon cancer Other        aunt  . Skin cancer Brother   . Skin cancer Sister   . Cancer Sister   . Heart disease Sister   . Diabetes Neg Hx   . Stroke Neg Hx     ROS: Review of Systems  Constitutional: Positive for weight loss.  Respiratory: Negative for shortness of breath (on exertion).   Cardiovascular: Negative for chest pain.    PHYSICAL EXAM: Blood pressure 129/65, pulse (!) 58, temperature 97.7 F (36.5 C), temperature source Oral, height 5'  9" (1.753 m), weight 208 lb (94.3 kg), SpO2 99 %. Body mass index is 30.72 kg/m. Physical Exam  Constitutional: He is oriented to person, place, and time. He appears well-developed and well-nourished.  Cardiovascular:  Bradycardic  Pulmonary/Chest: Effort normal.  Musculoskeletal: Normal  range of motion.  Neurological: He is oriented to person, place, and time.  Skin: Skin is warm and dry.  Psychiatric: He has a normal mood and affect. His behavior is normal.  Vitals reviewed.   RECENT LABS AND TESTS: BMET    Component Value Date/Time   NA 144 08/14/2017 0913   K 4.6 08/14/2017 0913   CL 104 08/14/2017 0913   CO2 26 08/14/2017 0913   GLUCOSE 95 08/14/2017 0913   GLUCOSE 107 (H) 12/07/2016 0852   BUN 17 08/14/2017 0913   CREATININE 1.05 08/14/2017 0913   CREATININE 1.08 12/14/2015 1122   CALCIUM 9.5 08/14/2017 0913   GFRNONAA 68 08/14/2017 0913   GFRAA 79 08/14/2017 0913   Lab Results  Component Value Date   HGBA1C 5.6 08/14/2017   HGBA1C 5.7 (H) 04/19/2017   HGBA1C 5.9 (H) 01/18/2017   HGBA1C 6.1 12/07/2016   Lab Results  Component Value Date   INSULIN 10.8 08/14/2017   INSULIN 17.3 04/19/2017   INSULIN 16.1 01/18/2017   CBC    Component Value Date/Time   WBC 4.7 01/18/2017 0938   WBC 5.6 12/07/2016 0852   RBC 4.76 01/18/2017 0938   RBC 4.72 12/07/2016 0852   HGB 15.0 01/18/2017 0938   HCT 43.9 01/18/2017 0938   PLT 185.0 12/07/2016 0852   PLT 156 04/07/2010   MCV 92 01/18/2017 0938   MCH 31.5 01/18/2017 0938   MCH 31.1 12/14/2015 1122   MCHC 34.2 01/18/2017 0938   MCHC 34.3 12/07/2016 0852   RDW 13.1 01/18/2017 0938   LYMPHSABS 2.0 01/18/2017 0938   MONOABS 0.4 12/07/2016 0852   EOSABS 0.1 01/18/2017 0938   BASOSABS 0.0 01/18/2017 0938   Iron/TIBC/Ferritin/ %Sat No results found for: IRON, TIBC, FERRITIN, IRONPCTSAT Lipid Panel     Component Value Date/Time   CHOL 113 08/14/2017 0913   TRIG 121 08/14/2017 0913   TRIG 256 09/14/2009 0000    HDL 42 08/14/2017 0913   CHOLHDL 3 12/07/2016 0852   VLDL 30.8 12/07/2016 0852   LDLCALC 47 08/14/2017 0913   LDLDIRECT 68.0 04/13/2015 0823   Hepatic Function Panel     Component Value Date/Time   PROT 6.6 08/14/2017 0913   ALBUMIN 4.6 08/14/2017 0913   AST 22 08/14/2017 0913   ALT 19 08/14/2017 0913   ALKPHOS 61 08/14/2017 0913   BILITOT 0.6 08/14/2017 0913      Component Value Date/Time   TSH 4.880 (H) 08/14/2017 0913   TSH 5.110 (H) 04/19/2017 0818   TSH 4.960 (H) 01/18/2017 0938    ASSESSMENT AND PLAN: Essential hypertension  Class 1 obesity with serious comorbidity and body mass index (BMI) of 30.0 to 30.9 in adult, unspecified obesity type  PLAN:  Hypertension We discussed sodium restriction, working on healthy weight loss, and a regular exercise program as the means to achieve improved blood pressure control. Romie agreed with this plan and agreed to follow up as directed. We will continue to monitor his blood pressure as well as his progress with the above lifestyle modifications. He will continue his medications as prescribed and will watch for signs of hypotension as he continues his lifestyle modifications.  We spent > than 50% of the 15 minute visit on the counseling as documented in the note.  Obesity Tri is currently in the action stage of change. As such, his goal is to continue with weight loss efforts He has agreed to keep a food journal with 1500 calories and 90 grams of  protein daily [place] has been instructed to work up to a goal of 150 minutes of combined cardio and strengthening exercise per week for weight loss and overall health benefits. We discussed the following Behavioral Modification Strategies today: increasing lean protein intake and work on meal planning and easy cooking plans  Ronav has agreed to follow up with our clinic in 2 weeks. He was informed of the importance of frequent follow up visits to maximize his success with intensive  lifestyle modifications for his multiple health conditions.  I, Doreene Nest, am acting as transcriptionist for Lacy Duverney, PA-C  I have reviewed the above documentation for accuracy and completeness, and I agree with the above. -Lacy Duverney, PA-C  I have reviewed the above note and agree with the plan. -[name], MD   OBESITY BEHAVIORAL INTERVENTION VISIT  Today's visit was # 13 out of 22.  Starting weight: 230 lbs Starting date: 01/18/17 Today's weight : 208 lbs Today's date: 09/18/2017 Total lbs lost to date: 28 (Patients must lose 7 lbs in the first 6 months to continue with counseling)   ASK: We discussed the diagnosis of obesity with [name] today and Jodi agreed to give [place] permission to discuss obesity behavioral modification therapy today.  ASSESS: Yariel has the diagnosis of obesity and his BMI today is 30.7 Jahn is in the action stage of change   ADVISE: Ridgely was educated on the multiple health risks of obesity as well as the benefit of weight loss to improve his health. He was advised of the need for long term treatment and the importance of lifestyle modifications.  AGREE: Multiple dietary modification options and treatment options were discussed and  Yasmin agreed to keep a food journal with 1500 calories and 90 grams of protein daily We discussed the following Behavioral Modification Strategies today: increasing lean protein intake and work on meal planning and easy cooking plans

## 2017-10-02 ENCOUNTER — Ambulatory Visit (INDEPENDENT_AMBULATORY_CARE_PROVIDER_SITE_OTHER): Payer: Medicare HMO | Admitting: Physician Assistant

## 2017-10-02 VITALS — BP 137/72 | HR 71 | Temp 97.9°F | Ht 69.0 in | Wt 210.0 lb

## 2017-10-02 DIAGNOSIS — I10 Essential (primary) hypertension: Secondary | ICD-10-CM

## 2017-10-02 DIAGNOSIS — Z9189 Other specified personal risk factors, not elsewhere classified: Secondary | ICD-10-CM | POA: Diagnosis not present

## 2017-10-02 DIAGNOSIS — E038 Other specified hypothyroidism: Secondary | ICD-10-CM | POA: Insufficient documentation

## 2017-10-02 DIAGNOSIS — E669 Obesity, unspecified: Secondary | ICD-10-CM

## 2017-10-02 DIAGNOSIS — Z6831 Body mass index (BMI) 31.0-31.9, adult: Secondary | ICD-10-CM

## 2017-10-02 DIAGNOSIS — E039 Hypothyroidism, unspecified: Secondary | ICD-10-CM

## 2017-10-02 MED ORDER — LEVOTHYROXINE SODIUM 25 MCG PO TABS: 25.0000 ug | ORAL_TABLET | Freq: Every day | ORAL | 0 refills | Status: DC

## 2017-10-02 NOTE — Progress Notes (Signed)
Office: [phone]  /  Fax: [phone]   HPI:   Chief Complaint: OBESITY Philip [name] is here to discuss his progress with his obesity treatment plan. He is on the keep a food journal with 1500 calories and 90 grams of protein daily and is following his eating plan approximately 75 % of the time. He states he is exercising 0 minutes 0 times per week. Philip [name] had increase celebration eating and has not been as mindful of his eating. He is motivated to get back on track and continue weight loss.  His weight is 210 lb (95.3 kg) today and has gained 2 pounds since his last visit. He has lost 20 lbs since starting treatment with [place].  Hypothyroidism, Subclinical Philip [name] has a diagnosis of hypothyroidism. He is on levothyroxine. He denies hot or cold intolerance or palpitations, but does admit to ongoing fatigue.  Hypertension Philip [name] is a 76 y.o. male with hypertension. Philip [name]'s blood pressure is stable and he denies chest pain or shortness of breath. He is working weight loss to help control his blood pressure with the goal of decreasing his risk of heart attack and stroke. Philip [name]'s blood pressure is not currently controlled.  ALLERGIES: No Known Allergies  MEDICATIONS: Current Outpatient Medications on File Prior to Visit  Medication Sig Dispense Refill  . amLODipine (NORVASC) 10 MG tablet Take 0.5 tablets (5 mg total) daily by mouth. 45 tablet 0  . atorvastatin (LIPITOR) 40 MG tablet Take 1 tablet (40 mg total) by mouth at bedtime. 90 tablet 2  . B Complex-Biotin-FA (B-COMPLEX PO) Take 1 tablet by mouth daily.    . Cholecalciferol (VITAMIN D3) 5000 UNITS CAPS Take 5,000 Units by mouth daily.    Marland Kitchen co-enzyme Q-10 30 MG capsule Take 30 mg by mouth daily.    Marland Kitchen ELIQUIS 5 MG TABS tablet TAKE 1 TABLET TWICE DAILY 180 tablet 1  . fenofibrate (TRICOR) 48 MG tablet TAKE 1 TABLET EVERY DAY 90 tablet 2  . hydrocortisone 2.5 % cream Apply 1 application topically as needed.    Marland Kitchen lisinopril  (PRINIVIL,ZESTRIL) 10 MG tablet Take 1 tablet (10 mg total) by mouth daily. 90 tablet 3  . magnesium oxide (MAG-OX) 400 MG tablet Take 800 mg by mouth daily.    . Melatonin 10 MG CAPS Take 10 mg at bedtime by mouth.    . Multiple Vitamins-Minerals (MULTIVITAMIN WITH MINERALS) tablet Take 1 tablet by mouth daily.    . Omega-3 Fatty Acids (FISH OIL) 1200 MG CAPS Take 1,200 mg by mouth daily.    . polyethylene glycol (MIRALAX / GLYCOLAX) packet Take 17 g by mouth daily.    . metoprolol succinate (TOPROL-XL) 50 MG 24 hr tablet Take 1 tablet (50 mg total) by mouth daily. Take with or immediately following a meal. 90 tablet 1   Current Facility-Administered Medications on File Prior to Visit  Medication Dose Route Frequency Provider Last Rate Last Dose  . triamcinolone acetonide (KENALOG) 10 MG/ML injection 10 mg  10 mg Other Once Trula Slade, DPM        PAST MEDICAL HISTORY: Past Medical History:  Diagnosis Date  . AAA (abdominal aortic aneurysm) ([place])   . Atrial fibrillation ([place])   . CAD (coronary artery disease)    had a MI , s/p CABG  . Cataracts, bilateral    immature  . CHF (congestive heart failure) ([place])   . Dysrhythmia    paroximal a fib  . GERD (gastroesophageal reflux disease)  takes Omeprazole daily  . History of colon polyps   . History of kidney stones   . History of shingles   . Hyperlipidemia   . Hypertension    takes Amlodipine,Metoprolol,and Lisinopril daily  . Joint pain   . Joint swelling   . Myocardial infarction [facility])    several with last one being 2001(but never knew about any except 2001)  . OSA (obstructive sleep apnea)    started CPAP 12/09  . Peripheral vascular disease ([place])   . Pneumonia    as a child  . Skin cancer    several , one of them was melanoma (sees derm routinely)  . Tingling    feet occasionally  . Tinnitus     PAST SURGICAL HISTORY: Past Surgical History:  Procedure Laterality Date  . ABDOMINAL AORTAGRAM N/A 06/16/2014     Procedure: ABDOMINAL Maxcine Ham;  Surgeon: Angelia Mould, MD;  Location: [facility] CATH LAB;  Service: Cardiovascular;  Laterality: N/A;  . ABDOMINAL AORTIC ENDOVASCULAR STENT GRAFT N/A 07/08/2014   Procedure: ABDOMINAL AORTIC ENDOVASCULAR STENT GRAFT/ GORE;  Surgeon: Angelia Mould, MD;  Location: [place];  Service: Vascular;  Laterality: N/A;  . CARDIAC CATHETERIZATION  2015  . COLONOSCOPY    . CORONARY ARTERY BYPASS GRAFT  1999   x 3 vessels  . LEFT HEART CATHETERIZATION WITH CORONARY/GRAFT ANGIOGRAM N/A 05/29/2014   Procedure: LEFT HEART CATHETERIZATION WITH [name];  Surgeon: [name], MD;  Location: [facility] CATH LAB;  Service: Cardiovascular;  Laterality: N/A;  . PENILE PROSTHESIS IMPLANT  08/2005  . RHINOPLASTY  1975    SOCIAL HISTORY: Social History   Tobacco Use  . Smoking status: Former Smoker    Types: Cigarettes    Last attempt to quit: 1979    Years since quitting: 39.9  . Smokeless tobacco: Never Used  . Tobacco comment: quit smoking in 1979  Substance Use Topics  . Alcohol use: Yes    Comment: occasionally - once per week  . Drug use: No    FAMILY HISTORY: Family History  Problem Relation Age of Onset  . Prostate cancer Father 63  . Heart disease Father   . Skin cancer Father   . Heart attack Father   . Cancer Father   . Deep vein thrombosis Father   . Hyperlipidemia Father   . Hypertension Father   . Heart disease Mother   . Skin cancer Mother   . Heart attack Mother   . Cancer Mother   . Hyperlipidemia Mother   . Hypertension Mother   . Varicose Veins Mother   . Peripheral vascular disease Mother        amputation  . Sleep apnea Brother   . Hyperlipidemia Brother   . Hypertension Brother   . Colon cancer Other        aunt  . Skin cancer Brother   . Skin cancer Sister   . Cancer Sister   . Heart disease Sister   . Diabetes Neg Hx   . Stroke Neg Hx     ROS: Review of Systems  Constitutional: Positive for  malaise/fatigue. Negative for weight loss.  Respiratory: Negative for shortness of breath.   Cardiovascular: Negative for chest pain.    PHYSICAL EXAM: Blood pressure 137/72, pulse 71, temperature 97.9 F (36.6 C), temperature source Oral, height 5\' 9"  (1.753 m), weight 210 lb (95.3 kg), SpO2 93 %. Body mass index is 31.01 kg/m. Physical Exam  Constitutional: He is oriented to person,  place, and time. He appears well-developed and well-nourished.  Cardiovascular: Normal rate.  Pulmonary/Chest: Effort normal.  Musculoskeletal: Normal range of motion.  Neurological: He is oriented to person, place, and time.  Skin: Skin is warm and dry.  Psychiatric: He has a normal mood and affect. His behavior is normal.  Vitals reviewed.   RECENT LABS AND TESTS: BMET    Component Value Date/Time   NA 144 08/14/2017 0913   K 4.6 08/14/2017 0913   CL 104 08/14/2017 0913   CO2 26 08/14/2017 0913   GLUCOSE 95 08/14/2017 0913   GLUCOSE 107 (H) 12/07/2016 0852   BUN 17 08/14/2017 0913   CREATININE 1.05 08/14/2017 0913   CREATININE 1.08 12/14/2015 1122   CALCIUM 9.5 08/14/2017 0913   GFRNONAA 68 08/14/2017 0913   GFRAA 79 08/14/2017 0913   Lab Results  Component Value Date   HGBA1C 5.6 08/14/2017   HGBA1C 5.7 (H) 04/19/2017   HGBA1C 5.9 (H) 01/18/2017   HGBA1C 6.1 12/07/2016   Lab Results  Component Value Date   INSULIN 10.8 08/14/2017   INSULIN 17.3 04/19/2017   INSULIN 16.1 01/18/2017   CBC    Component Value Date/Time   WBC 4.7 01/18/2017 0938   WBC 5.6 12/07/2016 0852   RBC 4.76 01/18/2017 0938   RBC 4.72 12/07/2016 0852   HGB 15.0 01/18/2017 0938   HCT 43.9 01/18/2017 0938   PLT 185.0 12/07/2016 0852   PLT 156 04/07/2010   MCV 92 01/18/2017 0938   MCH 31.5 01/18/2017 0938   MCH 31.1 12/14/2015 1122   MCHC 34.2 01/18/2017 0938   MCHC 34.3 12/07/2016 0852   RDW 13.1 01/18/2017 0938   LYMPHSABS 2.0 01/18/2017 0938   MONOABS 0.4 12/07/2016 0852   EOSABS 0.1 01/18/2017  0938   BASOSABS 0.0 01/18/2017 0938   Iron/TIBC/Ferritin/ %Sat No results found for: IRON, TIBC, FERRITIN, IRONPCTSAT Lipid Panel     Component Value Date/Time   CHOL 113 08/14/2017 0913   TRIG 121 08/14/2017 0913   TRIG 256 09/14/2009 0000   HDL 42 08/14/2017 0913   CHOLHDL 3 12/07/2016 0852   VLDL 30.8 12/07/2016 0852   LDLCALC 47 08/14/2017 0913   LDLDIRECT 68.0 04/13/2015 0823   Hepatic Function Panel     Component Value Date/Time   PROT 6.6 08/14/2017 0913   ALBUMIN 4.6 08/14/2017 0913   AST 22 08/14/2017 0913   ALT 19 08/14/2017 0913   ALKPHOS 61 08/14/2017 0913   BILITOT 0.6 08/14/2017 0913      Component Value Date/Time   TSH 4.880 (H) 08/14/2017 0913   TSH 5.110 (H) 04/19/2017 0818   TSH 4.960 (H) 01/18/2017 0938    ASSESSMENT AND PLAN: Subclinical hypothyroidism - Plan: levothyroxine (SYNTHROID, LEVOTHROID) 25 MCG tablet  Essential hypertension  Class 1 obesity with serious comorbidity and body mass index (BMI) of 31.0 to 31.9 in adult, unspecified obesity type  PLAN:  Hypothyroidism, Subclinical Philip [name] was informed of the importance of good thyroid control to help with weight loss efforts. He was also informed that supertheraputic thyroid levels are dangerous and will not improve weight loss results. Philip [name] agrees to continue taking levothyroxine 25 mcg qd #30 and we will refill for 1 month. Philip [name] agrees to follow up with our clinic in 2 weeks.   Hypertension We discussed sodium restriction, working on healthy weight loss, and a regular exercise program as the means to achieve improved blood pressure control. Philip [name] with this plan and [name] to follow up as  directed. We will continue to monitor his blood pressure as well as his progress with the above lifestyle modifications. He will continue his medications as prescribed and will watch for signs of hypotension as he continues his lifestyle modifications. Philip [name] agrees to follow up with our clinic in  2 weeks.  Obesity Philip [name] is currently in the action stage of change. As such, his goal is to continue with weight loss efforts He has [name] to keep a food journal with 1500 calories and 90 grams of protein daily Philip [name] has been instructed to work up to a goal of 150 minutes of combined cardio and strengthening exercise per week for weight loss and overall health benefits. We discussed the following Behavioral Modification Strategies today: increasing lean protein intake and work on meal planning and easy cooking plans   Philip [name] to follow up with our clinic in 2 weeks. He was informed of the importance of frequent follow up visits to maximize his success with intensive lifestyle modifications for his multiple health conditions.  I, Trixie Dredge, am acting as transcriptionist for Lacy Duverney, PA-C  I have reviewed the above documentation for accuracy and completeness, and I agree with the above. -Lacy Duverney, PA-C  I have reviewed the above note and agree with the plan. -[name], MD     Today's visit was # 14 out of 22.  Starting weight: 230 lbs Starting date: 01/18/17 Today's weight : 210 lbs  Today's date: 10/03/2017 Total lbs lost to date: 20 (Patients must lose 7 lbs in the first 6 months to continue with counseling)   ASK: We discussed the diagnosis of obesity with Philip [name] today and Mykell [name] to give [place] permission to discuss obesity behavioral modification therapy today.  ASSESS: Tracen has the diagnosis of obesity and his BMI today is 26 Philip [name] is in the action stage of change   ADVISE: Nilo was educated on the multiple health risks of obesity as well as the benefit of weight loss to improve his health. He was advised of the need for long term treatment and the importance of lifestyle modifications.  AGREE: Multiple dietary modification options and treatment options were discussed and  Trumaine [name] to keep a food journal with 1500 calories  and 90 grams of protein daily We discussed the following Behavioral Modification Strategies today: increasing lean protein intake and work on meal planning and easy cooking plans

## 2017-10-17 ENCOUNTER — Ambulatory Visit (INDEPENDENT_AMBULATORY_CARE_PROVIDER_SITE_OTHER): Payer: Medicare HMO | Admitting: Physician Assistant

## 2017-10-17 ENCOUNTER — Ambulatory Visit (INDEPENDENT_AMBULATORY_CARE_PROVIDER_SITE_OTHER): Payer: Medicare HMO | Admitting: Family Medicine

## 2017-10-18 ENCOUNTER — Ambulatory Visit: Payer: Medicare HMO | Admitting: Internal Medicine

## 2017-10-18 ENCOUNTER — Encounter: Payer: Self-pay | Admitting: Internal Medicine

## 2017-10-18 VITALS — BP 122/74 | HR 58 | Temp 98.4°F | Resp 14 | Ht 69.0 in | Wt 216.2 lb

## 2017-10-18 DIAGNOSIS — I10 Essential (primary) hypertension: Secondary | ICD-10-CM | POA: Diagnosis not present

## 2017-10-18 DIAGNOSIS — E669 Obesity, unspecified: Secondary | ICD-10-CM | POA: Diagnosis not present

## 2017-10-18 DIAGNOSIS — E039 Hypothyroidism, unspecified: Secondary | ICD-10-CM | POA: Diagnosis not present

## 2017-10-18 DIAGNOSIS — Z23 Encounter for immunization: Secondary | ICD-10-CM | POA: Diagnosis not present

## 2017-10-18 DIAGNOSIS — Z6830 Body mass index (BMI) 30.0-30.9, adult: Secondary | ICD-10-CM

## 2017-10-18 DIAGNOSIS — E038 Other specified hypothyroidism: Secondary | ICD-10-CM

## 2017-10-18 NOTE — Progress Notes (Signed)
Subjective:    Patient ID: Philip [name], male    DOB: 1941/02/15, 76 y.o.   MRN: [id number]  DOS:  10/18/2017 Type of visit - description : f/u Interval history: Obesity, follow-up elsewhere, doing well with diet on and off, has not been able to exercise much lately due to the weather. Subclinical hypothyroidism: TSH was slightly elevated, Rx levothyroxine by weight management. HTN: Good compliance with medication.  No apparent side effects.  Good compliance with Eliquis, no apparent side effects.  Wt Readings from Last 3 Encounters:  10/19/17 211 lb (95.7 kg)  10/18/17 216 lb 4 oz (98.1 kg)  10/02/17 210 lb (95.3 kg)     Review of Systems Denies chest pain or difficulty breathing.  No lower extremity edema or palpitations No blood in the urine or the stools.    Past Medical History:  Diagnosis Date  . AAA (abdominal aortic aneurysm) ([place])   . Atrial fibrillation ([place])   . CAD (coronary artery disease)    had a MI , s/p CABG  . Cataracts, bilateral    immature  . CHF (congestive heart failure) ([place])   . Dysrhythmia    paroximal a fib  . GERD (gastroesophageal reflux disease)    takes Omeprazole daily  . History of colon polyps   . History of kidney stones   . History of shingles   . Hyperlipidemia   . Hypertension    takes Amlodipine,Metoprolol,and Lisinopril daily  . Joint pain   . Joint swelling   . Myocardial infarction [facility])    several with last one being 2001(but never knew about any except 2001)  . OSA (obstructive sleep apnea)    started CPAP 12/09  . Peripheral vascular disease ([place])   . Pneumonia    as a child  . Skin cancer    several , one of them was melanoma (sees derm routinely)  . Tingling    feet occasionally  . Tinnitus     Past Surgical History:  Procedure Laterality Date  . ABDOMINAL AORTAGRAM N/A 06/16/2014   Procedure: ABDOMINAL Maxcine Ham;  Surgeon: Angelia Mould, MD;  Location: [facility] CATH LAB;  Service: Cardiovascular;   Laterality: N/A;  . ABDOMINAL AORTIC ENDOVASCULAR STENT GRAFT N/A 07/08/2014   Procedure: ABDOMINAL AORTIC ENDOVASCULAR STENT GRAFT/ GORE;  Surgeon: Angelia Mould, MD;  Location: [place];  Service: Vascular;  Laterality: N/A;  . CARDIAC CATHETERIZATION  2015  . COLONOSCOPY    . CORONARY ARTERY BYPASS GRAFT  1999   x 3 vessels  . LEFT HEART CATHETERIZATION WITH CORONARY/GRAFT ANGIOGRAM N/A 05/29/2014   Procedure: LEFT HEART CATHETERIZATION WITH [name];  Surgeon: [name], MD;  Location: [facility] CATH LAB;  Service: Cardiovascular;  Laterality: N/A;  . PENILE PROSTHESIS IMPLANT  08/2005  . RHINOPLASTY  1975    Social History   Socioeconomic History  . Marital status: Married    Spouse name: Not on file  . Number of children: 3  . Years of education: 32  . Highest education level: Not on file  Social Needs  . Financial resource strain: Not on file  . Food insecurity - worry: Not on file  . Food insecurity - inability: Not on file  . Transportation needs - medical: Not on file  . Transportation needs - non-medical: Not on file  Occupational History  . Occupation: retired, Actor  Tobacco Use  . Smoking status: Former Smoker    Types: Cigarettes    Last attempt  to quit: 1979    Years since quitting: 39.9  . Smokeless tobacco: Never Used  . Tobacco comment: quit smoking in 1979  Substance and Sexual Activity  . Alcohol use: Yes    Comment: occasionally - once per week  . Drug use: No  . Sexual activity: Not Currently  Other Topics Concern  . Not on file  Social History Narrative   Lives w/ wife in a 2 story home.  Has one son and 2 stepchildren.  Retired for [employer].     Education: high school.      Allergies as of 10/18/2017   No Known Allergies     Medication List        Accurate as of 10/18/17 11:59 PM. Always use your most recent med list.          amLODipine 10 MG tablet Commonly known as:  NORVASC Take 0.5 tablets (5 mg total)  daily by mouth.   atorvastatin 40 MG tablet Commonly known as:  LIPITOR Take 1 tablet (40 mg total) by mouth at bedtime.   B-COMPLEX PO Take 1 tablet by mouth daily.   co-enzyme Q-10 30 MG capsule Take 30 mg by mouth daily.   ELIQUIS 5 MG Tabs tablet Generic drug:  apixaban TAKE 1 TABLET TWICE DAILY   fenofibrate 48 MG tablet Commonly known as:  TRICOR TAKE 1 TABLET EVERY DAY   Fish Oil 1200 MG Caps Take 1,200 mg by mouth daily.   hydrocortisone 2.5 % cream Apply 1 application topically as needed.   levothyroxine 25 MCG tablet Commonly known as:  SYNTHROID, LEVOTHROID Take 1 tablet (25 mcg total) by mouth daily before breakfast.   lisinopril 10 MG tablet Commonly known as:  PRINIVIL,ZESTRIL Take 1 tablet (10 mg total) by mouth daily.   magnesium oxide 400 MG tablet Commonly known as:  MAG-OX Take 800 mg by mouth daily.   Melatonin 10 MG Caps Take 10 mg at bedtime by mouth.   metoprolol succinate 50 MG 24 hr tablet Commonly known as:  TOPROL-XL Take 1 tablet (50 mg total) by mouth daily. Take with or immediately following a meal.   multivitamin with minerals tablet Take 1 tablet by mouth daily.   polyethylene glycol packet Commonly known as:  MIRALAX / GLYCOLAX Take 17 g by mouth daily.   Vitamin D3 5000 units Caps Take 5,000 Units by mouth daily.          Objective:   Physical Exam BP 122/74 (BP Location: Right Arm, Patient Position: Sitting, Cuff Size: Normal)   Pulse (!) 58   Temp 98.4 F (36.9 C) (Oral)   Resp 14   Ht 5\' 9"  (1.753 m)   Wt 216 lb 4 oz (98.1 kg)   SpO2 98%   BMI 31.93 kg/m  General:   Well developed, well nourished . NAD.  HEENT:  Normocephalic . Face symmetric, atraumatic Lungs:  CTA B Normal respiratory effort, no intercostal retractions, no accessory muscle use. Heart: Seems to be regular,  no murmur.  Trace pretibial edema bilaterally  Skin: Not pale. Not jaundice Neurologic:  alert & oriented X3.  Speech normal,  gait appropriate for age and unassisted Psych--  Cognition and judgment appear intact.  Cooperative with normal attention span and concentration.  Behavior appropriate. No anxious or depressed appearing.      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 Fib, paroxysmal  GERD  Chronic constipation OSA- Cpap (Dr [name]) ED- penile implant H/o skin cancer, Dr. [name] H/o Kidney stones 2016  PLAN:  Hyperglycemia: Last A1c improved. Obesity: Sees weight management, has not make much progress losing weight but his blood sugar have improved.  Praised.  Exercise discussed. HTN: Well-controlled on lisinopril, metoprolol, amlodipine.  Last BMP satisfactory Subclinical hypothyroidism: Slightly increased TSH, started levothyroxine per weight management, to have labs tomorrow Labs reviewed: No need for blood work today PNM 23 booster today RTC around 4 months for CPX.

## 2017-10-18 NOTE — Patient Instructions (Signed)
  GO TO THE FRONT DESK Schedule your next appointment for a  Physical exam by February or March 2019

## 2017-10-18 NOTE — Progress Notes (Signed)
Pre visit review using our clinic review tool, if applicable. No additional management support is needed unless otherwise documented below in the visit note. 

## 2017-10-19 ENCOUNTER — Other Ambulatory Visit (INDEPENDENT_AMBULATORY_CARE_PROVIDER_SITE_OTHER): Payer: Self-pay | Admitting: Family Medicine

## 2017-10-19 ENCOUNTER — Ambulatory Visit (INDEPENDENT_AMBULATORY_CARE_PROVIDER_SITE_OTHER): Payer: Medicare HMO | Admitting: Family Medicine

## 2017-10-19 VITALS — BP 129/63 | HR 59 | Temp 98.1°F | Ht 69.0 in | Wt 211.0 lb

## 2017-10-19 DIAGNOSIS — Z6831 Body mass index (BMI) 31.0-31.9, adult: Secondary | ICD-10-CM | POA: Diagnosis not present

## 2017-10-19 DIAGNOSIS — Z9189 Other specified personal risk factors, not elsewhere classified: Secondary | ICD-10-CM

## 2017-10-19 DIAGNOSIS — E039 Hypothyroidism, unspecified: Secondary | ICD-10-CM | POA: Diagnosis not present

## 2017-10-19 DIAGNOSIS — E669 Obesity, unspecified: Secondary | ICD-10-CM | POA: Diagnosis not present

## 2017-10-19 DIAGNOSIS — E038 Other specified hypothyroidism: Secondary | ICD-10-CM

## 2017-10-19 MED ORDER — LEVOTHYROXINE SODIUM 25 MCG PO TABS: 25.0000 ug | ORAL_TABLET | Freq: Every day | ORAL | 0 refills | Status: DC

## 2017-10-19 NOTE — Progress Notes (Signed)
Office: [phone]  /  Fax: [phone]   HPI:   Chief Complaint: OBESITY Philip [name] is here to discuss his progress with his obesity treatment plan. He is on the keep a food journal with 1500 calories and 90 grams of protein daily and is following his eating plan approximately 75 % of the time. He states he is exercising 0 minutes 0 times per week. Philip [name] has done well mostly maintaining his weight, but with increased holiday parties and increased temptations. He is considering starting a gym. His weight is 211 lb (95.7 kg) today and has had a weight gain of 10 pounds over a period of 2 weeks since his last visit. He has lost 19 lbs since starting treatment with [place].  Subclinical Hypothyroid Philip [name] has a diagnosis of hypothyroidism. He is on a very low dose of levothyroxine. He denies hot or cold intolerance or palpitations and is now due for labs to re-evaluate the need for medications.  ALLERGIES: No Known Allergies  MEDICATIONS: Current Outpatient Medications on File Prior to Visit  Medication Sig Dispense Refill  . amLODipine (NORVASC) 10 MG tablet Take 0.5 tablets (5 mg total) daily by mouth. 45 tablet 0  . atorvastatin (LIPITOR) 40 MG tablet Take 1 tablet (40 mg total) by mouth at bedtime. 90 tablet 2  . B Complex-Biotin-FA (B-COMPLEX PO) Take 1 tablet by mouth daily.    . Cholecalciferol (VITAMIN D3) 5000 UNITS CAPS Take 5,000 Units by mouth daily.    Philip [name] Kitchen co-enzyme Q-10 30 MG capsule Take 30 mg by mouth daily.    Philip [name] Kitchen ELIQUIS 5 MG TABS tablet TAKE 1 TABLET TWICE DAILY 180 tablet 1  . fenofibrate (TRICOR) 48 MG tablet TAKE 1 TABLET EVERY DAY 90 tablet 2  . hydrocortisone 2.5 % cream Apply 1 application topically as needed.    Philip [name] Kitchen levothyroxine (SYNTHROID, LEVOTHROID) 25 MCG tablet Take 1 tablet (25 mcg total) by mouth daily before breakfast. 30 tablet 0  . lisinopril (PRINIVIL,ZESTRIL) 10 MG tablet Take 1 tablet (10 mg total) by mouth daily. 90 tablet 3  . magnesium oxide (MAG-OX) 400  MG tablet Take 800 mg by mouth daily.    . Melatonin 10 MG CAPS Take 10 mg at bedtime by mouth.    . Multiple Vitamins-Minerals (MULTIVITAMIN WITH MINERALS) tablet Take 1 tablet by mouth daily.    . Omega-3 Fatty Acids (FISH OIL) 1200 MG CAPS Take 1,200 mg by mouth daily.    . polyethylene glycol (MIRALAX / GLYCOLAX) packet Take 17 g by mouth daily.    . metoprolol succinate (TOPROL-XL) 50 MG 24 hr tablet Take 1 tablet (50 mg total) by mouth daily. Take with or immediately following a meal. 90 tablet 1   Current Facility-Administered Medications on File Prior to Visit  Medication Dose Route Frequency Provider Last Rate Last Dose  . triamcinolone acetonide (KENALOG) 10 MG/ML injection 10 mg  10 mg Other Once Trula Slade, DPM        PAST MEDICAL HISTORY: Past Medical History:  Diagnosis Date  . AAA (abdominal aortic aneurysm) ([place])   . Atrial fibrillation ([place])   . CAD (coronary artery disease)    had a MI , s/p CABG  . Cataracts, bilateral    immature  . CHF (congestive heart failure) ([place])   . Dysrhythmia    paroximal a fib  . GERD (gastroesophageal reflux disease)    takes Omeprazole daily  . History of colon polyps   . History of kidney stones   .  History of shingles   . Hyperlipidemia   . Hypertension    takes Amlodipine,Metoprolol,and Lisinopril daily  . Joint pain   . Joint swelling   . Myocardial infarction [facility])    several with last one being 2001(but never knew about any except 2001)  . OSA (obstructive sleep apnea)    started CPAP 12/09  . Peripheral vascular disease ([place])   . Pneumonia    as a child  . Skin cancer    several , one of them was melanoma (sees derm routinely)  . Tingling    feet occasionally  . Tinnitus     PAST SURGICAL HISTORY: Past Surgical History:  Procedure Laterality Date  . ABDOMINAL AORTAGRAM N/A 06/16/2014   Procedure: ABDOMINAL Maxcine Ham;  Surgeon: Angelia Mould, MD;  Location: [facility] CATH LAB;  Service: Cardiovascular;   Laterality: N/A;  . ABDOMINAL AORTIC ENDOVASCULAR STENT GRAFT N/A 07/08/2014   Procedure: ABDOMINAL AORTIC ENDOVASCULAR STENT GRAFT/ GORE;  Surgeon: Angelia Mould, MD;  Location: [place];  Service: Vascular;  Laterality: N/A;  . CARDIAC CATHETERIZATION  2015  . COLONOSCOPY    . CORONARY ARTERY BYPASS GRAFT  1999   x 3 vessels  . LEFT HEART CATHETERIZATION WITH CORONARY/GRAFT ANGIOGRAM N/A 05/29/2014   Procedure: LEFT HEART CATHETERIZATION WITH [name];  Surgeon: [name], MD;  Location: [facility] CATH LAB;  Service: Cardiovascular;  Laterality: N/A;  . PENILE PROSTHESIS IMPLANT  08/2005  . RHINOPLASTY  1975    SOCIAL HISTORY: Social History   Tobacco Use  . Smoking status: Former Smoker    Types: Cigarettes    Last attempt to quit: 1979    Years since quitting: 39.9  . Smokeless tobacco: Never Used  . Tobacco comment: quit smoking in 1979  Substance Use Topics  . Alcohol use: Yes    Comment: occasionally - once per week  . Drug use: No    FAMILY HISTORY: Family History  Problem Relation Age of Onset  . Prostate cancer Father 74  . Heart disease Father   . Skin cancer Father   . Heart attack Father   . Cancer Father   . Deep vein thrombosis Father   . Hyperlipidemia Father   . Hypertension Father   . Heart disease Mother   . Skin cancer Mother   . Heart attack Mother   . Cancer Mother   . Hyperlipidemia Mother   . Hypertension Mother   . Varicose Veins Mother   . Peripheral vascular disease Mother        amputation  . Sleep apnea Brother   . Hyperlipidemia Brother   . Hypertension Brother   . Colon cancer Other        aunt  . Skin cancer Brother   . Skin cancer Sister   . Cancer Sister   . Heart disease Sister   . Diabetes Neg Hx   . Stroke Neg Hx     ROS: Review of Systems  Constitutional: Negative for weight loss.  Cardiovascular: Negative for palpitations.  Endo/Heme/Allergies:       Negative Heat/Cold intolerance    PHYSICAL  EXAM: Blood pressure 129/63, pulse (!) 59, temperature 98.1 F (36.7 C), temperature source Oral, height 5\' 9"  (1.753 m), weight 211 lb (95.7 kg), SpO2 98 %. Body mass index is 31.16 kg/m. Physical Exam  Constitutional: He is oriented to person, place, and time. He appears well-developed and well-nourished.  Cardiovascular: Normal rate.  Pulmonary/Chest: Effort normal.  Musculoskeletal: Normal range  of motion.  Neurological: He is oriented to person, place, and time.  Skin: Skin is warm and dry.  Vitals reviewed.   RECENT LABS AND TESTS: BMET    Component Value Date/Time   NA 144 08/14/2017 0913   K 4.6 08/14/2017 0913   CL 104 08/14/2017 0913   CO2 26 08/14/2017 0913   GLUCOSE 95 08/14/2017 0913   GLUCOSE 107 (H) 12/07/2016 0852   BUN 17 08/14/2017 0913   CREATININE 1.05 08/14/2017 0913   CREATININE 1.08 12/14/2015 1122   CALCIUM 9.5 08/14/2017 0913   GFRNONAA 68 08/14/2017 0913   GFRAA 79 08/14/2017 0913   Lab Results  Component Value Date   HGBA1C 5.6 08/14/2017   HGBA1C 5.7 (H) 04/19/2017   HGBA1C 5.9 (H) 01/18/2017   HGBA1C 6.1 12/07/2016   Lab Results  Component Value Date   INSULIN 10.8 08/14/2017   INSULIN 17.3 04/19/2017   INSULIN 16.1 01/18/2017   CBC    Component Value Date/Time   WBC 4.7 01/18/2017 0938   WBC 5.6 12/07/2016 0852   RBC 4.76 01/18/2017 0938   RBC 4.72 12/07/2016 0852   HGB 15.0 01/18/2017 0938   HCT 43.9 01/18/2017 0938   PLT 185.0 12/07/2016 0852   PLT 156 04/07/2010   MCV 92 01/18/2017 0938   MCH 31.5 01/18/2017 0938   MCH 31.1 12/14/2015 1122   MCHC 34.2 01/18/2017 0938   MCHC 34.3 12/07/2016 0852   RDW 13.1 01/18/2017 0938   LYMPHSABS 2.0 01/18/2017 0938   MONOABS 0.4 12/07/2016 0852   EOSABS 0.1 01/18/2017 0938   BASOSABS 0.0 01/18/2017 0938   Iron/TIBC/Ferritin/ %Sat No results found for: IRON, TIBC, FERRITIN, IRONPCTSAT Lipid Panel     Component Value Date/Time   CHOL 113 08/14/2017 0913   TRIG 121 08/14/2017  0913   TRIG 256 09/14/2009 0000   HDL 42 08/14/2017 0913   CHOLHDL 3 12/07/2016 0852   VLDL 30.8 12/07/2016 0852   LDLCALC 47 08/14/2017 0913   LDLDIRECT 68.0 04/13/2015 0823   Hepatic Function Panel     Component Value Date/Time   PROT 6.6 08/14/2017 0913   ALBUMIN 4.6 08/14/2017 0913   AST 22 08/14/2017 0913   ALT 19 08/14/2017 0913   ALKPHOS 61 08/14/2017 0913   BILITOT 0.6 08/14/2017 0913      Component Value Date/Time   TSH 4.880 (H) 08/14/2017 0913   TSH 5.110 (H) 04/19/2017 0818   TSH 4.960 (H) 01/18/2017 0938    ASSESSMENT AND PLAN: Subclinical hypothyroidism - Plan: TSH, T4, free, T3, levothyroxine (SYNTHROID, LEVOTHROID) 25 MCG tablet  Class 1 obesity with serious comorbidity and body mass index (BMI) of 31.0 to 31.9 in adult, unspecified obesity type  PLAN:  Subclinical Hypothyroid Philip [name] was informed of the importance of good thyroid control to help with weight loss efforts. He was also informed that supertheraputic thyroid levels are dangerous and will not improve weight loss results. Philip [name] agrees to continue levothyroxine 25 mcg qd #30 with no refills. We will check labs and Philip [name] agrees to follow up with our clinic in 3 weeks.  Obesity Philip [name] is currently in the action stage of change. As such, his goal is to continue with weight loss efforts He has agreed to portion control better and make smarter food choices, such as increase vegetables and decrease simple carbohydrates  Philip [name] has been instructed to work up to a goal of 150 minutes of combined cardio and strengthening exercise per week and was encouraged to start  exercising whether at a gym or just walking for weight loss and overall health benefits. We discussed the following Behavioral Modification Strategies today: holiday eating strategies   Philip [name] has agreed to follow up with our clinic in 3 weeks. He was informed of the importance of frequent follow up visits to maximize his success with intensive  lifestyle modifications for his multiple health conditions.  I, Philip [name], am acting as transcriptionist for Philip Nip, MD  I have reviewed the above documentation for accuracy and completeness, and I agree with the above. -Philip Nip, MD    OBESITY BEHAVIORAL INTERVENTION VISIT  Today's visit was # 15 out of 67.  Starting weight: 230 lbs Starting date: 01/18/17 Today's weight : 211 lbs Today's date: 10/19/2017 Total lbs lost to date: 37 (Patients must lose 7 lbs in the first 6 months to continue with counseling)   ASK: We discussed the diagnosis of obesity with [name] today and Infant agreed to give [place] permission to discuss obesity behavioral modification therapy today.  ASSESS: Peighton has the diagnosis of obesity and his BMI today is 31.15 Seferino is in the action stage of change   ADVISE: Bonham was educated on the multiple health risks of obesity as well as the benefit of weight loss to improve his health. He was advised of the need for long term treatment and the importance of lifestyle modifications.  AGREE: Multiple dietary modification options and treatment options were discussed and  Andruw agreed to portion control better and make smarter food choices, such as increase vegetables and decrease simple carbohydrates  We discussed the following Behavioral Modification Strategies today: holiday eating strategies

## 2017-10-19 NOTE — Assessment & Plan Note (Signed)
Hyperglycemia: Last A1c improved. Obesity: Sees weight management, has not make much progress losing weight but his blood sugar have improved.  Praised.  Exercise discussed. HTN: Well-controlled on lisinopril, metoprolol, amlodipine.  Last BMP satisfactory Subclinical hypothyroidism: Slightly increased TSH, started levothyroxine per weight management, to have labs tomorrow Labs reviewed: No need for blood work today PNM 23 booster today RTC around 4 months for CPX.

## 2017-10-20 LAB — TSH: TSH: 2.43 u[IU]/mL (ref 0.450–4.500)

## 2017-10-20 LAB — T4, FREE: FREE T4: 1.2 ng/dL (ref 0.82–1.77)

## 2017-10-20 LAB — T3: T3 TOTAL: 99 ng/dL (ref 71–180)

## 2017-11-08 ENCOUNTER — Ambulatory Visit (INDEPENDENT_AMBULATORY_CARE_PROVIDER_SITE_OTHER): Payer: Medicare HMO | Admitting: Physician Assistant

## 2017-11-08 VITALS — BP 143/73 | HR 62 | Temp 98.0°F | Ht 69.0 in | Wt 215.0 lb

## 2017-11-08 DIAGNOSIS — I10 Essential (primary) hypertension: Secondary | ICD-10-CM

## 2017-11-08 DIAGNOSIS — Z6831 Body mass index (BMI) 31.0-31.9, adult: Secondary | ICD-10-CM | POA: Diagnosis not present

## 2017-11-08 DIAGNOSIS — E669 Obesity, unspecified: Secondary | ICD-10-CM

## 2017-11-08 NOTE — Progress Notes (Addendum)
.   Office: [phone]  /  Fax: [phone]   HPI:   Chief Complaint: OBESITY Philip [name] is here to discuss his progress with his obesity treatment plan. He is on the portion control better and make smarter food choices plan and is following his eating plan approximately 75 % of the time. He states he is exercising 0 minutes 0 times per week. Philip [name] has deviated with his eating after the holidays. He is motivated to get back on track and continue with weight loss. His weight is 215 lb (97.5 kg) today and has had a weight gain of 4 pounds over a period of 2 weeks since his last visit. He has lost 15 lbs since starting treatment with [place].  Hypertension Philip [name] is a 77 y.o. male with hypertension. His blood [name] is elevated today at 143/73. He states blood [name] at home is stable. Philip [name] denies chest pain or shortness of breath on exertion. He is working weight loss to help control his blood [name] with the goal of decreasing his risk of heart attack and stroke. Philip [name] is not currently controlled.  ALLERGIES: No Known Allergies  MEDICATIONS: Current Outpatient Medications on File Prior to Visit  Medication Sig Dispense Refill  . amLODipine (NORVASC) 10 MG tablet Take 0.5 tablets (5 mg total) daily by mouth. 45 tablet 0  . atorvastatin (LIPITOR) 40 MG tablet Take 1 tablet (40 mg total) by mouth at bedtime. 90 tablet 2  . B Complex-Biotin-FA (B-COMPLEX PO) Take 1 tablet by mouth daily.    . Cholecalciferol (VITAMIN D3) 5000 UNITS CAPS Take 5,000 Units by mouth daily.    Marland Kitchen co-enzyme Q-10 30 MG capsule Take 30 mg by mouth daily.    Marland Kitchen ELIQUIS 5 MG TABS tablet TAKE 1 TABLET TWICE DAILY 180 tablet 1  . fenofibrate (TRICOR) 48 MG tablet TAKE 1 TABLET EVERY DAY 90 tablet 2  . hydrocortisone 2.5 % cream Apply 1 application topically as needed.    Marland Kitchen levothyroxine (SYNTHROID, LEVOTHROID) 25 MCG tablet Take 1 tablet (25 mcg total) by mouth daily before breakfast.  30 tablet 0  . lisinopril (PRINIVIL,ZESTRIL) 10 MG tablet Take 1 tablet (10 mg total) by mouth daily. 90 tablet 3  . magnesium oxide (MAG-OX) 400 MG tablet Take 800 mg by mouth daily.    . Melatonin 10 MG CAPS Take 10 mg at bedtime by mouth.    . Multiple Vitamins-Minerals (MULTIVITAMIN WITH MINERALS) tablet Take 1 tablet by mouth daily.    . Omega-3 Fatty Acids (FISH OIL) 1200 MG CAPS Take 1,200 mg by mouth daily.    . polyethylene glycol (MIRALAX / GLYCOLAX) packet Take 17 g by mouth daily.    . metoprolol succinate (TOPROL-XL) 50 MG 24 hr tablet Take 1 tablet (50 mg total) by mouth daily. Take with or immediately following a meal. 90 tablet 1   Current Facility-Administered Medications on File Prior to Visit  Medication Dose Route Frequency Provider Last Rate Last Dose  . triamcinolone acetonide (KENALOG) 10 MG/ML injection 10 mg  10 mg Other Once Philip [name], DPM        PAST MEDICAL HISTORY: Past Medical History:  Diagnosis Date  . AAA (abdominal aortic aneurysm) ([place])   . Atrial fibrillation ([place])   . CAD (coronary artery disease)    had a MI , s/p CABG  . Cataracts, bilateral    immature  . CHF (congestive heart failure) ([place])   . Dysrhythmia  paroximal a fib  . GERD (gastroesophageal reflux disease)    takes Omeprazole daily  . History of colon polyps   . History of kidney stones   . History of shingles   . Hyperlipidemia   . Hypertension    takes Amlodipine,Metoprolol,and Lisinopril daily  . Joint pain   . Joint swelling   . Myocardial infarction [facility])    several with last one being 2001(but never knew about any except 2001)  . OSA (obstructive sleep apnea)    started CPAP 12/09  . Peripheral vascular disease ([place])   . Pneumonia    as a child  . Skin cancer    several , one of them was melanoma (sees derm routinely)  . Tingling    feet occasionally  . Tinnitus     PAST SURGICAL HISTORY: Past Surgical History:  Procedure Laterality Date  .  ABDOMINAL AORTAGRAM N/A 06/16/2014   Procedure: ABDOMINAL Philip [name];  Surgeon: Philip Mould, MD;  Location: [facility] CATH LAB;  Service: Cardiovascular;  Laterality: N/A;  . ABDOMINAL AORTIC ENDOVASCULAR STENT GRAFT N/A 07/08/2014   Procedure: ABDOMINAL AORTIC ENDOVASCULAR STENT GRAFT/ GORE;  Surgeon: Philip Mould, MD;  Location: [place];  Service: Vascular;  Laterality: N/A;  . CARDIAC CATHETERIZATION  2015  . COLONOSCOPY    . CORONARY ARTERY BYPASS GRAFT  1999   x 3 vessels  . LEFT HEART CATHETERIZATION WITH CORONARY/GRAFT ANGIOGRAM N/A 05/29/2014   Procedure: LEFT HEART CATHETERIZATION WITH Philip [name];  Surgeon: Philip Dresser, MD;  Location: [facility] CATH LAB;  Service: Cardiovascular;  Laterality: N/A;  . PENILE PROSTHESIS IMPLANT  08/2005  . RHINOPLASTY  1975    SOCIAL HISTORY: Social History   Tobacco Use  . Smoking status: Former Smoker    Types: Cigarettes    Last attempt to quit: 1979    Years since quitting: 40.0  . Smokeless tobacco: Never Used  . Tobacco comment: quit smoking in 1979  Substance Use Topics  . Alcohol use: Yes    Comment: occasionally - once per week  . Drug use: No    FAMILY HISTORY: Family History  Problem Relation Age of Onset  . Prostate cancer Father 71  . Heart disease Father   . Skin cancer Father   . Heart attack Father   . Cancer Father   . Deep vein thrombosis Father   . Hyperlipidemia Father   . Hypertension Father   . Heart disease Mother   . Skin cancer Mother   . Heart attack Mother   . Cancer Mother   . Hyperlipidemia Mother   . Hypertension Mother   . Varicose Veins Mother   . Peripheral vascular disease Mother        amputation  . Sleep apnea Brother   . Hyperlipidemia Brother   . Hypertension Brother   . Colon cancer Other        aunt  . Skin cancer Brother   . Skin cancer Sister   . Cancer Sister   . Heart disease Sister   . Diabetes Neg Hx   . Stroke Neg Hx     ROS: Review of Systems    Constitutional: Negative for weight loss.  Respiratory: Negative for shortness of breath (on exertion).   Cardiovascular: Negative for chest pain.    PHYSICAL EXAM: Blood [name] (!) 143/73, pulse 62, temperature 98 F (36.7 C), temperature source Oral, height 5\' 9"  (1.753 m), weight 215 lb (97.5 kg), SpO2 97 %. Body mass index  is 31.75 kg/m. Physical Exam  Constitutional: He is oriented to person, place, and time. He appears well-developed and well-nourished.  Cardiovascular: Normal rate.  Pulmonary/Chest: Effort normal.  Musculoskeletal: Normal range of motion.  Neurological: He is oriented to person, place, and time.  Skin: Skin is warm and dry.  Psychiatric: He has a normal mood and affect. His behavior is normal.  Vitals reviewed.   RECENT LABS AND TESTS: BMET    Component Value Date/Time   NA 144 08/14/2017 0913   K 4.6 08/14/2017 0913   CL 104 08/14/2017 0913   CO2 26 08/14/2017 0913   GLUCOSE 95 08/14/2017 0913   GLUCOSE 107 (H) 12/07/2016 0852   BUN 17 08/14/2017 0913   CREATININE 1.05 08/14/2017 0913   CREATININE 1.08 12/14/2015 1122   CALCIUM 9.5 08/14/2017 0913   GFRNONAA 68 08/14/2017 0913   GFRAA 79 08/14/2017 0913   Lab Results  Component Value Date   HGBA1C 5.6 08/14/2017   HGBA1C 5.7 (H) 04/19/2017   HGBA1C 5.9 (H) 01/18/2017   HGBA1C 6.1 12/07/2016   Lab Results  Component Value Date   INSULIN 10.8 08/14/2017   INSULIN 17.3 04/19/2017   INSULIN 16.1 01/18/2017   CBC    Component Value Date/Time   WBC 4.7 01/18/2017 0938   WBC 5.6 12/07/2016 0852   RBC 4.76 01/18/2017 0938   RBC 4.72 12/07/2016 0852   HGB 15.0 01/18/2017 0938   HCT 43.9 01/18/2017 0938   PLT 185.0 12/07/2016 0852   PLT 156 04/07/2010   MCV 92 01/18/2017 0938   MCH 31.5 01/18/2017 0938   MCH 31.1 12/14/2015 1122   MCHC 34.2 01/18/2017 0938   MCHC 34.3 12/07/2016 0852   RDW 13.1 01/18/2017 0938   LYMPHSABS 2.0 01/18/2017 0938   MONOABS 0.4 12/07/2016 0852    EOSABS 0.1 01/18/2017 0938   BASOSABS 0.0 01/18/2017 0938   Iron/TIBC/Ferritin/ %Sat No results found for: IRON, TIBC, FERRITIN, IRONPCTSAT Lipid Panel     Component Value Date/Time   CHOL 113 08/14/2017 0913   TRIG 121 08/14/2017 0913   TRIG 256 09/14/2009 0000   HDL 42 08/14/2017 0913   CHOLHDL 3 12/07/2016 0852   VLDL 30.8 12/07/2016 0852   LDLCALC 47 08/14/2017 0913   LDLDIRECT 68.0 04/13/2015 0823   Hepatic Function Panel     Component Value Date/Time   PROT 6.6 08/14/2017 0913   ALBUMIN 4.6 08/14/2017 0913   AST 22 08/14/2017 0913   ALT 19 08/14/2017 0913   ALKPHOS 61 08/14/2017 0913   BILITOT 0.6 08/14/2017 0913      Component Value Date/Time   TSH 2.430 10/19/2017 0906   TSH 4.880 (H) 08/14/2017 0913   TSH 5.110 (H) 04/19/2017 0818    ASSESSMENT AND PLAN: Essential hypertension  Class 1 obesity with serious comorbidity and body mass index (BMI) of 31.0 to 31.9 in adult, unspecified obesity type  PLAN:  Hypertension We discussed sodium restriction, working on healthy weight loss, and a regular exercise program as the means to achieve improved blood [name] control. Philip [name] agreed with this plan and agreed to follow up as directed. We will continue to monitor his blood [name] as well as his progress with the above lifestyle modifications. He will continue his medications as prescribed and will watch for signs of hypotension as he continues his lifestyle modifications. Philip [name] was advised to keep a blood [name] log and bring in for review.   We spent > than 50% of the 15 minute visit  on the counseling as documented in the note.  Obesity Philip [name] is currently in the action stage of change. As such, his goal is to continue with weight loss efforts He has agreed to follow the Category 3 plan Philip [name] has been instructed to work up to a goal of 150 minutes of combined cardio and strengthening exercise per week for weight loss and overall health benefits. We  discussed the following Behavioral Modification Strategies today: increasing lean protein intake and planning for success  Philip [name] has agreed to follow up with our clinic in 2 weeks. He was informed of the importance of frequent follow up visits to maximize his success with intensive lifestyle modifications for his multiple health conditions.  Philip [name], am acting as transcriptionist for Philip [name], [facility] have reviewed this note and agree with its contents    OBESITY BEHAVIORAL INTERVENTION VISIT  Today's visit was # 16 out of 25.  Starting weight: 230 lbs Starting date: 01/18/17 Today's weight : 215 lbs  Today's date: 11/08/2017 Total lbs lost to date: 15 (Patients must lose 7 lbs in the first 6 months to continue with counseling)   ASK: We discussed the diagnosis of obesity with Philip [name] today and Philip [name] agreed to give [place] permission to discuss obesity behavioral modification therapy today.  ASSESS: Philip [name] has the diagnosis of obesity and his BMI today is 31.74 Philip [name] is in the action stage of change   ADVISE: Philip [name] was educated on the multiple health risks of obesity as well as the benefit of weight loss to improve his health. He was advised of the need for long term treatment and the importance of lifestyle modifications.  AGREE: Multiple dietary modification options and treatment options were discussed and  Philip [name] agreed to the above obesity treatment plan.  I have reviewed the above documentation for accuracy and completeness, and I agree with the above. -[name], MD

## 2017-11-15 DIAGNOSIS — L578 Other skin changes due to chronic exposure to nonionizing radiation: Secondary | ICD-10-CM | POA: Diagnosis not present

## 2017-11-20 DIAGNOSIS — H2513 Age-related nuclear cataract, bilateral: Secondary | ICD-10-CM | POA: Diagnosis not present

## 2017-11-20 DIAGNOSIS — H25013 Cortical age-related cataract, bilateral: Secondary | ICD-10-CM | POA: Diagnosis not present

## 2017-11-20 DIAGNOSIS — H25043 Posterior subcapsular polar age-related cataract, bilateral: Secondary | ICD-10-CM | POA: Diagnosis not present

## 2017-11-20 DIAGNOSIS — H40013 Open angle with borderline findings, low risk, bilateral: Secondary | ICD-10-CM | POA: Diagnosis not present

## 2017-11-22 ENCOUNTER — Ambulatory Visit (INDEPENDENT_AMBULATORY_CARE_PROVIDER_SITE_OTHER): Payer: Medicare HMO | Admitting: Physician Assistant

## 2017-11-22 ENCOUNTER — Encounter (INDEPENDENT_AMBULATORY_CARE_PROVIDER_SITE_OTHER): Payer: Self-pay | Admitting: Physician Assistant

## 2017-11-22 VITALS — BP 121/70 | HR 64 | Temp 97.9°F | Ht 69.0 in | Wt 213.0 lb

## 2017-11-22 DIAGNOSIS — E039 Hypothyroidism, unspecified: Secondary | ICD-10-CM

## 2017-11-22 DIAGNOSIS — F3289 Other specified depressive episodes: Secondary | ICD-10-CM

## 2017-11-22 DIAGNOSIS — E669 Obesity, unspecified: Secondary | ICD-10-CM | POA: Diagnosis not present

## 2017-11-22 DIAGNOSIS — E038 Other specified hypothyroidism: Secondary | ICD-10-CM

## 2017-11-22 DIAGNOSIS — Z6831 Body mass index (BMI) 31.0-31.9, adult: Secondary | ICD-10-CM

## 2017-11-22 DIAGNOSIS — Z9189 Other specified personal risk factors, not elsewhere classified: Secondary | ICD-10-CM

## 2017-11-22 MED ORDER — BUPROPION HCL ER (SR) 150 MG PO TB12: 150.0000 mg | ORAL_TABLET | Freq: Every day | ORAL | 0 refills | Status: DC

## 2017-11-22 MED ORDER — LEVOTHYROXINE SODIUM 25 MCG PO TABS: 25.0000 ug | ORAL_TABLET | Freq: Every day | ORAL | 0 refills | Status: DC

## 2017-11-22 NOTE — Progress Notes (Addendum)
Office: [phone]  /  Fax: [phone]   HPI:   Chief Complaint: OBESITY Philip [name] is here to discuss his progress with his obesity treatment plan. He is on the Category 3 plan and is following his eating plan approximately 75 % of the time. He states he is walking for 60 minutes 1 time per week. Philip [name] continues to do well with weight loss. He states he is eating less than the category 3 meal plan and would like to try a different meal plan. Philip [name] also states , he has noticed an increase in cravings and would like medicine to help with this. His weight is 213 lb (96.6 kg) today and has had a weight loss of 2 pounds over a period of 2 weeks since his last visit. He has lost 17 lbs since starting treatment with [place].  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 His TSH, T3 and T4 are all within normal limits. He is on synthroid. He denies hot or cold intolerance or palpitations. He states he has fatigue.  Depression with emotional eating behaviors [name] is struggling with emotional eating and using food for comfort to the extent that it is negatively impacting his health. He often snacks when he is not hungry. [name] sometimes feels he is out of control and then feels guilty that he made poor food choices. He has been working on behavior modification techniques to help reduce his emotional eating and has been somewhat successful. His mood is stable and he shows no sign of suicidal or homicidal ideations.  Depression screen [facility] 2/9 01/18/2017 12/06/2016 12/21/2015 06/19/2015 04/22/2014  Decreased Interest 3 1 0 0 0  Down, Depressed, Hopeless 1 0 0 0 0  PHQ - 2 Score 4 1 0 0 0  Altered sleeping 2 - - - -  Tired, decreased energy 2 - - - -  Change in appetite 1 - - - -  Feeling bad or failure about yourself  2 - - - -  Trouble concentrating 1 - - - -  Moving slowly or fidgety/restless 1 - - - -  Suicidal thoughts 0 - - - -  PHQ-9 Score 13 - - - -      ALLERGIES: No Known Allergies  MEDICATIONS: Current Outpatient Medications on File Prior to Visit  Medication Sig Dispense Refill  . amLODipine (NORVASC) 10 MG tablet Take 0.5 tablets (5 mg total) daily by mouth. 45 tablet 0  . atorvastatin (LIPITOR) 40 MG tablet Take 1 tablet (40 mg total) by mouth at bedtime. 90 tablet 2  . B Complex-Biotin-FA (B-COMPLEX PO) Take 1 tablet by mouth daily.    . Cholecalciferol (VITAMIN D3) 5000 UNITS CAPS Take 5,000 Units by mouth daily.    Marland Kitchen co-enzyme Q-10 30 MG capsule Take 30 mg by mouth daily.    Marland Kitchen ELIQUIS 5 MG TABS tablet TAKE 1 TABLET TWICE DAILY 180 tablet 1  . fenofibrate (TRICOR) 48 MG tablet TAKE 1 TABLET EVERY DAY 90 tablet 2  . hydrocortisone 2.5 % cream Apply 1 application topically as needed.    Marland Kitchen levothyroxine (SYNTHROID, LEVOTHROID) 25 MCG tablet Take 1 tablet (25 mcg total) by mouth daily before breakfast. 30 tablet 0  . lisinopril (PRINIVIL,ZESTRIL) 10 MG tablet Take 1 tablet (10 mg total) by mouth daily. 90 tablet 3  . magnesium oxide (MAG-OX) 400 MG tablet Take 800 mg by mouth daily.    . Melatonin 10 MG CAPS Take 10 mg at bedtime by mouth.    Marland Kitchen  Multiple Vitamins-Minerals (MULTIVITAMIN WITH MINERALS) tablet Take 1 tablet by mouth daily.    . Omega-3 Fatty Acids (FISH OIL) 1200 MG CAPS Take 1,200 mg by mouth daily.    . polyethylene glycol (MIRALAX / GLYCOLAX) packet Take 17 g by mouth daily.    . metoprolol succinate (TOPROL-XL) 50 MG 24 hr tablet Take 1 tablet (50 mg total) by mouth daily. Take with or immediately following a meal. 90 tablet 1   Current Facility-Administered Medications on File Prior to Visit  Medication Dose Route Frequency Provider Last Rate Last Dose  . triamcinolone acetonide (KENALOG) 10 MG/ML injection 10 mg  10 mg Other Once Trula Slade, DPM        PAST MEDICAL HISTORY: Past Medical History:  Diagnosis Date  . AAA (abdominal aortic aneurysm) ([place])   . Atrial fibrillation ([place])   . CAD  (coronary artery disease)    had a MI , s/p CABG  . Cataracts, bilateral    immature  . CHF (congestive heart failure) ([place])   . Dysrhythmia    paroximal a fib  . GERD (gastroesophageal reflux disease)    takes Omeprazole daily  . History of colon polyps   . History of kidney stones   . History of shingles   . Hyperlipidemia   . Hypertension    takes Amlodipine,Metoprolol,and Lisinopril daily  . Joint pain   . Joint swelling   . Myocardial infarction [facility])    several with last one being 2001(but never knew about any except 2001)  . OSA (obstructive sleep apnea)    started CPAP 12/09  . Peripheral vascular disease ([place])   . Pneumonia    as a child  . Skin cancer    several , one of them was melanoma (sees derm routinely)  . Tingling    feet occasionally  . Tinnitus     PAST SURGICAL HISTORY: Past Surgical History:  Procedure Laterality Date  . ABDOMINAL AORTAGRAM N/A 06/16/2014   Procedure: ABDOMINAL Maxcine Ham;  Surgeon: Angelia Mould, MD;  Location: [facility] CATH LAB;  Service: Cardiovascular;  Laterality: N/A;  . ABDOMINAL AORTIC ENDOVASCULAR STENT GRAFT N/A 07/08/2014   Procedure: ABDOMINAL AORTIC ENDOVASCULAR STENT GRAFT/ GORE;  Surgeon: Angelia Mould, MD;  Location: [place];  Service: Vascular;  Laterality: N/A;  . CARDIAC CATHETERIZATION  2015  . COLONOSCOPY    . CORONARY ARTERY BYPASS GRAFT  1999   x 3 vessels  . LEFT HEART CATHETERIZATION WITH CORONARY/GRAFT ANGIOGRAM N/A 05/29/2014   Procedure: LEFT HEART CATHETERIZATION WITH [name];  Surgeon: [name], MD;  Location[facility] CATH LAB;  Service: Cardiovascular;  Laterality: N/A;  . PENILE PROSTHESIS IMPLANT  08/2005  . RHINOPLASTY  1975    SOCIAL HISTORY: Social History   Tobacco Use  . Smoking status: Former Smoker    Types: Cigarettes    Last attempt to quit: 1979    Years since quitting: 40.0  . Smokeless tobacco: Never Used  . Tobacco comment: quit smoking in 1979   Substance Use Topics  . Alcohol use: Yes    Comment: occasionally - once per week  . Drug use: No    FAMILY HISTORY: Family History  Problem Relation Age of Onset  . Prostate cancer Father 105  . Heart disease Father   . Skin cancer Father   . Heart attack Father   . Cancer Father   . Deep vein thrombosis Father   . Hyperlipidemia Father   . Hypertension Father   .  Heart disease Mother   . Skin cancer Mother   . Heart attack Mother   . Cancer Mother   . Hyperlipidemia Mother   . Hypertension Mother   . Varicose Veins Mother   . Peripheral vascular disease Mother        amputation  . Sleep apnea Brother   . Hyperlipidemia Brother   . Hypertension Brother   . Colon cancer Other        aunt  . Skin cancer Brother   . Skin cancer Sister   . Cancer Sister   . Heart disease Sister   . Diabetes Neg Hx   . Stroke Neg Hx     ROS: Review of Systems  Constitutional: Positive for malaise/fatigue and weight loss.  Cardiovascular: Negative for palpitations.  Endo/Heme/Allergies: Negative.        Heat/cold intolerance  Psychiatric/Behavioral: Positive for depression. Negative for suicidal ideas.    PHYSICAL EXAM: Blood pressure 121/70, pulse 64, temperature 97.9 F (36.6 C), temperature source Oral, height 5\' 9"  (1.753 m), weight 213 lb (96.6 kg), SpO2 98 %. Body mass index is 31.45 kg/m. Physical Exam  Constitutional: He is oriented to person, place, and time. He appears well-developed and well-nourished.  Cardiovascular: Normal rate.  Pulmonary/Chest: Effort normal.  Musculoskeletal: Normal range of motion.  Neurological: He is oriented to person, place, and time.  Skin: Skin is warm and dry.  Psychiatric: He has a normal mood and affect. His behavior is normal.  Vitals reviewed.   RECENT LABS AND TESTS: BMET    Component Value Date/Time   NA 144 08/14/2017 0913   K 4.6 08/14/2017 0913   CL 104 08/14/2017 0913   CO2 26 08/14/2017 0913   GLUCOSE 95  08/14/2017 0913   GLUCOSE 107 (H) 12/07/2016 0852   BUN 17 08/14/2017 0913   CREATININE 1.05 08/14/2017 0913   CREATININE 1.08 12/14/2015 1122   CALCIUM 9.5 08/14/2017 0913   GFRNONAA 68 08/14/2017 0913   GFRAA 79 08/14/2017 0913   Lab Results  Component Value Date   HGBA1C 5.6 08/14/2017   HGBA1C 5.7 (H) 04/19/2017   HGBA1C 5.9 (H) 01/18/2017   HGBA1C 6.1 12/07/2016   Lab Results  Component Value Date   INSULIN 10.8 08/14/2017   INSULIN 17.3 04/19/2017   INSULIN 16.1 01/18/2017   CBC    Component Value Date/Time   WBC 4.7 01/18/2017 0938   WBC 5.6 12/07/2016 0852   RBC 4.76 01/18/2017 0938   RBC 4.72 12/07/2016 0852   HGB 15.0 01/18/2017 0938   HCT 43.9 01/18/2017 0938   PLT 185.0 12/07/2016 0852   PLT 156 04/07/2010   MCV 92 01/18/2017 0938   MCH 31.5 01/18/2017 0938   MCH 31.1 12/14/2015 1122   MCHC 34.2 01/18/2017 0938   MCHC 34.3 12/07/2016 0852   RDW 13.1 01/18/2017 0938   LYMPHSABS 2.0 01/18/2017 0938   MONOABS 0.4 12/07/2016 0852   EOSABS 0.1 01/18/2017 0938   BASOSABS 0.0 01/18/2017 0938   Iron/TIBC/Ferritin/ %Sat No results found for: IRON, TIBC, FERRITIN, IRONPCTSAT Lipid Panel     Component Value Date/Time   CHOL 113 08/14/2017 0913   TRIG 121 08/14/2017 0913   TRIG 256 09/14/2009 0000   HDL 42 08/14/2017 0913   CHOLHDL 3 12/07/2016 0852   VLDL 30.8 12/07/2016 0852   LDLCALC 47 08/14/2017 0913   LDLDIRECT 68.0 04/13/2015 0823   Hepatic Function Panel     Component Value Date/Time   PROT 6.6 08/14/2017 0913  ALBUMIN 4.6 08/14/2017 0913   AST 22 08/14/2017 0913   ALT 19 08/14/2017 0913   ALKPHOS 61 08/14/2017 0913   BILITOT 0.6 08/14/2017 0913      Component Value Date/Time   TSH 2.430 10/19/2017 0906   TSH 4.880 (H) 08/14/2017 0913   TSH 5.110 (H) 04/19/2017 0818    ASSESSMENT AND PLAN: Subclinical hypothyroidism - Plan: levothyroxine (SYNTHROID, LEVOTHROID) 25 MCG tablet  Other depression - with emotional eating - Plan:  buPROPion (WELLBUTRIN SR) 150 MG 12 hr tablet  Class 1 obesity with serious comorbidity and body mass index (BMI) of 31.0 to 31.9 in adult, unspecified obesity type  PLAN:  Subclinical Hypothyroidism Philip [name] was informed of the importance of good thyroid control to help with weight loss efforts. He was also informed that supertheraputic thyroid levels are dangerous and will not improve weight loss results. Philip [name] agrees to continue synthroid 25 mg qd #30 with no refills and follow up as directed.  Depression with Emotional Eating Behaviors We discussed behavior modification techniques today to help Philip [name] deal with his emotional eating and depression. He has agreed to start Wellbutrin SR 150 mg qd #30 with no refills and follow up as directed.  Obesity Philip [name] is currently in the action stage of change. As such, his goal is to continue with weight loss efforts He has agreed to follow the Category 2 plan Philip [name] has been instructed to work up to a goal of 150 minutes of combined cardio and strengthening exercise per week for weight loss and overall health benefits. We discussed the following Behavioral Modification Strategies today: increasing lean protein intake and work on meal planning and easy cooking plans  Philip [name] has agreed to follow up with our clinic in 2 weeks. He was informed of the importance of frequent follow up visits to maximize his success with intensive lifestyle modifications for his multiple health conditions.   OBESITY BEHAVIORAL INTERVENTION VISIT  Today's visit was # 17 out of 22.  Starting weight: 230 lbs Starting date: 01/18/17 Today's weight : 213 lbs  Today's date: 11/22/2017 Total lbs lost to date: 17 (Patients must lose 7 lbs in the first 6 months to continue with counseling)   ASK: We discussed the diagnosis of obesity with Philip [name] today and Philip [name] agreed to give [place] permission to discuss obesity behavioral modification therapy today.  ASSESS: Attikus  has the diagnosis of obesity and his BMI today is 31.44 Mizraim is in the action stage of change   ADVISE: Philip [name] was educated on the multiple health risks of obesity as well as the benefit of weight loss to improve his health. He was advised of the need for long term treatment and the importance of lifestyle modifications.  AGREE: Multiple dietary modification options and treatment options were discussed and  Philip [name] agreed to the above obesity treatment plan.   Corey Skains, am acting as transcriptionist for [employer], PA-C I, Lacy Duverney [facility], have reviewed this note and agree with its content

## 2017-12-04 ENCOUNTER — Telehealth: Payer: Self-pay | Admitting: Internal Medicine

## 2017-12-04 MED ORDER — AMLODIPINE BESYLATE 5 MG PO TABS: 5.0000 mg | ORAL_TABLET | Freq: Every day | ORAL | 3 refills | Status: DC

## 2017-12-04 NOTE — Telephone Encounter (Signed)
New message     Patient request medication be written for 5mg  only.   Pt c/o medication issue:  1. Name of Medication: amLODipine (NORVASC) 10 MG tablet  2. How are you currently taking this medication (dosage and times per day)? As prescribed  3. Are you having a reaction (difficulty breathing--STAT)? no  4. What is your medication issue? Patient wants script written for 5mg 

## 2017-12-04 NOTE — Telephone Encounter (Signed)
Spoke with patient who requested since he is near the end of his 10 mg Amlodipine tablets, and has to break them in half, he requests [place] now to order the 5 mg tablets.

## 2017-12-06 ENCOUNTER — Ambulatory Visit (INDEPENDENT_AMBULATORY_CARE_PROVIDER_SITE_OTHER): Payer: Medicare HMO | Admitting: Physician Assistant

## 2017-12-06 ENCOUNTER — Encounter (INDEPENDENT_AMBULATORY_CARE_PROVIDER_SITE_OTHER): Payer: Self-pay | Admitting: Physician Assistant

## 2017-12-06 VITALS — BP 151/73 | HR 54 | Temp 97.7°F | Ht 69.0 in | Wt 211.0 lb

## 2017-12-06 DIAGNOSIS — I10 Essential (primary) hypertension: Secondary | ICD-10-CM | POA: Diagnosis not present

## 2017-12-06 DIAGNOSIS — E038 Other specified hypothyroidism: Secondary | ICD-10-CM

## 2017-12-06 DIAGNOSIS — Z6831 Body mass index (BMI) 31.0-31.9, adult: Secondary | ICD-10-CM | POA: Diagnosis not present

## 2017-12-06 DIAGNOSIS — E669 Obesity, unspecified: Secondary | ICD-10-CM

## 2017-12-06 MED ORDER — LEVOTHYROXINE SODIUM 25 MCG PO TABS: 25.0000 ug | ORAL_TABLET | Freq: Every day | ORAL | 0 refills | Status: DC

## 2017-12-06 NOTE — Progress Notes (Signed)
Office: [phone]  /  Fax: [phone]   HPI:   Chief Complaint: OBESITY Philip [name] is here to discuss his progress with his obesity treatment plan. He is on the Category 2 plan and is following his eating plan approximately 75 % of the time. He states he is walking for 60 minutes 5 times per week. Philip [name] continues to do well with weight loss. He is back on track with following the structured meal plan and has been more mindful of his eating.  His weight is 211 lb (95.7 kg) today and has had a weight loss of 2 pounds over a period of 2 weeks since his last visit. He has lost 19 lbs since starting treatment with [place].  Hypertension Philip [name] is a 77 y.o. male with hypertension. Philip [name]'s blood pressure is elevated. He declines adjustment to his blood pressure medication today. He denies chest pain or claudation. He is working weight loss to help control his blood pressure with the goal of decreasing his risk of heart attack and stroke. Philip [name] blood pressure is not currently controlled.  Hypothyroidism Philip [name] has a diagnosis of hypothyroidism. He is on levothyroxine. He denies hot or cold intolerance, palpitations, or fatigue.  ALLERGIES: No Known Allergies  MEDICATIONS: Current Outpatient Medications on File Prior to Visit  Medication Sig Dispense Refill  . amLODipine (NORVASC) 5 MG tablet Take 1 tablet (5 mg total) by mouth daily. 90 tablet 3  . atorvastatin (LIPITOR) 40 MG tablet Take 1 tablet (40 mg total) by mouth at bedtime. 90 tablet 2  . B Complex-Biotin-FA (B-COMPLEX PO) Take 1 tablet by mouth daily.    Philip [name] buPROPion (WELLBUTRIN SR) 150 MG 12 hr tablet Take 1 tablet (150 mg total) by mouth daily. 30 tablet 0  . Cholecalciferol (VITAMIN D3) 5000 UNITS CAPS Take 5,000 Units by mouth daily.    Philip [name] co-enzyme Q-10 30 MG capsule Take 30 mg by mouth daily.    Philip [name] ELIQUIS 5 MG TABS tablet TAKE 1 TABLET TWICE DAILY 180 tablet 1  . fenofibrate (TRICOR) 48 MG tablet TAKE 1 TABLET EVERY DAY  90 tablet 2  . hydrocortisone 2.5 % cream Apply 1 application topically as needed.    Philip [name] levothyroxine (SYNTHROID, LEVOTHROID) 25 MCG tablet Take 1 tablet (25 mcg total) by mouth daily before breakfast. 30 tablet 0  . lisinopril (PRINIVIL,ZESTRIL) 10 MG tablet Take 1 tablet (10 mg total) by mouth daily. 90 tablet 3  . magnesium oxide (MAG-OX) 400 MG tablet Take 800 mg by mouth daily.    . Melatonin 10 MG CAPS Take 10 mg at bedtime by mouth.    . Multiple Vitamins-Minerals (MULTIVITAMIN WITH MINERALS) tablet Take 1 tablet by mouth daily.    . Omega-3 Fatty Acids (FISH OIL) 1200 MG CAPS Take 1,200 mg by mouth daily.    . polyethylene glycol (MIRALAX / GLYCOLAX) packet Take 17 g by mouth daily.    . metoprolol succinate (TOPROL-XL) 50 MG 24 hr tablet Take 1 tablet (50 mg total) by mouth daily. Take with or immediately following a meal. 90 tablet 1   Current Facility-Administered Medications on File Prior to Visit  Medication Dose Route Frequency Provider Last Rate Last Dose  . triamcinolone acetonide (KENALOG) 10 MG/ML injection 10 mg  10 mg Other Once Philip [name], DPM        PAST MEDICAL HISTORY: Past Medical History:  Diagnosis Date  . AAA (abdominal aortic aneurysm) ([place])   . Atrial fibrillation ([place])   .  CAD (coronary artery disease)    had a MI , s/p CABG  . Cataracts, bilateral    immature  . CHF (congestive heart failure) ([place])   . Dysrhythmia    paroximal a fib  . GERD (gastroesophageal reflux disease)    takes Omeprazole daily  . History of colon polyps   . History of kidney stones   . History of shingles   . Hyperlipidemia   . Hypertension    takes Amlodipine,Metoprolol,and Lisinopril daily  . Joint pain   . Joint swelling   . Myocardial infarction [facility])    several with last one being 2001(but never knew about any except 2001)  . OSA (obstructive sleep apnea)    started CPAP 12/09  . Peripheral vascular disease ([place])   . Pneumonia    as a child  . Skin cancer     several , one of them was melanoma (sees derm routinely)  . Tingling    feet occasionally  . Tinnitus     PAST SURGICAL HISTORY: Past Surgical History:  Procedure Laterality Date  . ABDOMINAL AORTAGRAM N/A 06/16/2014   Procedure: ABDOMINAL Maxcine Ham;  Surgeon: Angelia Mould, MD;  Location: [facility] CATH LAB;  Service: Cardiovascular;  Laterality: N/A;  . ABDOMINAL AORTIC ENDOVASCULAR STENT GRAFT N/A 07/08/2014   Procedure: ABDOMINAL AORTIC ENDOVASCULAR STENT GRAFT/ GORE;  Surgeon: Angelia Mould, MD;  Location: [facility];  Service: Vascular;  Laterality: N/A;  . CARDIAC CATHETERIZATION  2015  . COLONOSCOPY    . CORONARY ARTERY BYPASS GRAFT  1999   x 3 vessels  . LEFT HEART CATHETERIZATION WITH CORONARY/GRAFT ANGIOGRAM N/A 05/29/2014   Procedure: LEFT HEART CATHETERIZATION WITH [name];  Surgeon: [name], MD;  Location[facility] LAB;  Service: Cardiovascular;  Laterality: N/A;  . PENILE PROSTHESIS IMPLANT  08/2005  . RHINOPLASTY  1975    SOCIAL HISTORY: Social History   Tobacco Use  . Smoking status: Former Smoker    Types: Cigarettes    Last attempt to quit: 1979    Years since quitting: 40.1  . Smokeless tobacco: Never Used  . Tobacco comment: quit smoking in 1979  Substance Use Topics  . Alcohol use: Yes    Comment: occasionally - once per week  . Drug use: No    FAMILY HISTORY: Family History  Problem Relation Age of Onset  . Prostate cancer Father 108  . Heart disease Father   . Skin cancer Father   . Heart attack Father   . Cancer Father   . Deep vein thrombosis Father   . Hyperlipidemia Father   . Hypertension Father   . Heart disease Mother   . Skin cancer Mother   . Heart attack Mother   . Cancer Mother   . Hyperlipidemia Mother   . Hypertension Mother   . Varicose Veins Mother   . Peripheral vascular disease Mother        amputation  . Sleep apnea Brother   . Hyperlipidemia Brother   . Hypertension Brother   . Colon  cancer Other        aunt  . Skin cancer Brother   . Skin cancer Sister   . Cancer Sister   . Heart disease Sister   . Diabetes Neg Hx   . Stroke Neg Hx     ROS: Review of Systems  Constitutional: Positive for weight loss. Negative for malaise/fatigue.       Negative hot/cold intolerance  Cardiovascular: Negative for chest  pain, palpitations and claudication.    PHYSICAL EXAM: Blood pressure (!) 151/73, pulse (!) 54, temperature 97.7 F (36.5 C), temperature source Oral, height 5\' 9"  (1.753 m), weight 211 lb (95.7 kg), SpO2 99 %. Body mass index is 31.16 kg/m. Physical Exam  Constitutional: He is oriented to person, place, and time. He appears well-developed and well-nourished.  Cardiovascular: Bradycardia present.  Pulmonary/Chest: Effort normal.  Musculoskeletal: Normal range of motion.  Neurological: He is oriented to person, place, and time.  Skin: Skin is warm and dry.  Psychiatric: He has a normal mood and affect. His behavior is normal.  Vitals reviewed.   RECENT LABS AND TESTS: BMET    Component Value Date/Time   NA 144 08/14/2017 0913   K 4.6 08/14/2017 0913   CL 104 08/14/2017 0913   CO2 26 08/14/2017 0913   GLUCOSE 95 08/14/2017 0913   GLUCOSE 107 (H) 12/07/2016 0852   BUN 17 08/14/2017 0913   CREATININE 1.05 08/14/2017 0913   CREATININE 1.08 12/14/2015 1122   CALCIUM 9.5 08/14/2017 0913   GFRNONAA 68 08/14/2017 0913   GFRAA 79 08/14/2017 0913   Lab Results  Component Value Date   HGBA1C 5.6 08/14/2017   HGBA1C 5.7 (H) 04/19/2017   HGBA1C 5.9 (H) 01/18/2017   HGBA1C 6.1 12/07/2016   Lab Results  Component Value Date   INSULIN 10.8 08/14/2017   INSULIN 17.3 04/19/2017   INSULIN 16.1 01/18/2017   CBC    Component Value Date/Time   WBC 4.7 01/18/2017 0938   WBC 5.6 12/07/2016 0852   RBC 4.76 01/18/2017 0938   RBC 4.72 12/07/2016 0852   HGB 15.0 01/18/2017 0938   HCT 43.9 01/18/2017 0938   PLT 185.0 12/07/2016 0852   PLT 156 04/07/2010    MCV 92 01/18/2017 0938   MCH 31.5 01/18/2017 0938   MCH 31.1 12/14/2015 1122   MCHC 34.2 01/18/2017 0938   MCHC 34.3 12/07/2016 0852   RDW 13.1 01/18/2017 0938   LYMPHSABS 2.0 01/18/2017 0938   MONOABS 0.4 12/07/2016 0852   EOSABS 0.1 01/18/2017 0938   BASOSABS 0.0 01/18/2017 0938   Iron/TIBC/Ferritin/ %Sat No results found for: IRON, TIBC, FERRITIN, IRONPCTSAT Lipid Panel     Component Value Date/Time   CHOL 113 08/14/2017 0913   TRIG 121 08/14/2017 0913   TRIG 256 09/14/2009 0000   HDL 42 08/14/2017 0913   CHOLHDL 3 12/07/2016 0852   VLDL 30.8 12/07/2016 0852   LDLCALC 47 08/14/2017 0913   LDLDIRECT 68.0 04/13/2015 0823   Hepatic Function Panel     Component Value Date/Time   PROT 6.6 08/14/2017 0913   ALBUMIN 4.6 08/14/2017 0913   AST 22 08/14/2017 0913   ALT 19 08/14/2017 0913   ALKPHOS 61 08/14/2017 0913   BILITOT 0.6 08/14/2017 0913      Component Value Date/Time   TSH 2.430 10/19/2017 0906   TSH 4.880 (H) 08/14/2017 0913   TSH 5.110 (H) 04/19/2017 0818    ASSESSMENT AND PLAN: Essential hypertension  Other specified hypothyroidism - Plan: levothyroxine (SYNTHROID, LEVOTHROID) 25 MCG tablet  Class 1 obesity with serious comorbidity and body mass index (BMI) of 31.0 to 31.9 in adult, unspecified obesity type  PLAN:  Hypertension We discussed sodium restriction, working on healthy weight loss, and a regular exercise program as the means to achieve improved blood pressure control. Pavan agreed with this plan and agreed to follow up as directed. We will continue to monitor his blood pressure as well as his  progress with the above lifestyle modifications. He will continue his medications as prescribed and will watch for signs of hypotension as he continues his lifestyle modifications. Hezikiah agrees to follow up with our clinic in 2 weeks.  Hypothyroidism Philip [name] was informed of the importance of good thyroid control to help with weight loss efforts. He was also  informed that supertheraputic thyroid levels are dangerous and will not improve weight loss results. Philip [name] agrees to continue taking levothyroxine 25 mcg q AM #30 and we will refill for 1 month. Philip [name] agrees to follow up with our clinic in 2 weeks.  Obesity Philip [name] is currently in the action stage of change. As such, his goal is to continue with weight loss efforts He has agreed to follow the Category 2 plan Philip [name] has been instructed to work up to a goal of 150 minutes of combined cardio and strengthening exercise per week for weight loss and overall health benefits. We discussed the following Behavioral Modification Strategies today: increasing lean protein intake and work on meal planning and easy cooking plans   Philip [name] has agreed to follow up with our clinic in 2 weeks. He was informed of the importance of frequent follow up visits to maximize his success with intensive lifestyle modifications for his multiple health conditions.   OBESITY BEHAVIORAL INTERVENTION VISIT  Today's visit was # 18 out of 22.  Starting weight: 230 lbs Starting date: 01/18/17 Today's weight : 211 lbs  Today's date: 12/06/2017 Total lbs lost to date: 63 (Patients must lose 7 lbs in the first 6 months to continue with counseling)   ASK: We discussed the diagnosis of obesity with Philip [name] today and Philip [name] agreed to give [place] permission to discuss obesity behavioral modification therapy today.  ASSESS: Philip [name] has the diagnosis of obesity and his BMI today is 31.15 [place] is in the action stage of change   ADVISE: Philip [name] was educated on the multiple health risks of obesity as well as the benefit of weight loss to improve his health. He was advised of the need for long term treatment and the importance of lifestyle modifications.  AGREE: Multiple dietary modification options and treatment options were discussed and  Philip [name] agreed to the above obesity treatment plan.   Philip [name], am acting as  transcriptionist for Lacy Duverney, PA-C I, Lacy Duverney [facility], have reviewed this note and agree with its content.

## 2017-12-18 ENCOUNTER — Other Ambulatory Visit (INDEPENDENT_AMBULATORY_CARE_PROVIDER_SITE_OTHER): Payer: Self-pay | Admitting: Physician Assistant

## 2017-12-18 DIAGNOSIS — F3289 Other specified depressive episodes: Secondary | ICD-10-CM

## 2017-12-20 ENCOUNTER — Ambulatory Visit (INDEPENDENT_AMBULATORY_CARE_PROVIDER_SITE_OTHER): Payer: Medicare HMO | Admitting: Physician Assistant

## 2017-12-21 NOTE — Progress Notes (Signed)
Follow-up Outpatient Visit Date: 12/22/2017  Primary Care Provider: Colon Branch, MD [address] [place] [address]  Chief Complaint: Leg swelling  HPI:  Philip [name] is a 77 y.o. year-old male with history of CAD status post CABG (1999), AAA status post endovascular repair (2015), paroxysmal atrial fibrillation, HTN, hyperlipidemia, and CKD stage III, who presents for follow-up of coronary artery disease and atrial fibrillation.  I last saw Philip [name] in August, at which time he was doing well.  We did not make any medication changes at that time.  Today, Philip [name] reports that he has been feeling well. He denies chest pain, shortness of breath, palpitations, lightheadedness, orthopnea, and PND. His weight is up 9 pounds today compared with 2 weeks ago. He has also noted some mild edema in his calves. He thinks that he may be retaining a little bit of fluid. He has not eaten more salt than usual. He remains compliant with his medications. He remains active, playing golf several days a week without any difficulty. He continues to see a weight loss specialist.  --------------------------------------------------------------------------------------------------  Cardiovascular History & Procedures: Cardiovascular Problems:  Paroxysmal atrial fibrillation  Coronary artery disease status post CABG  Abdominal aortic aneurysm status post endovascular repair  Risk Factors:  Known coronary artery disease, PAD, hypertension, hyperlipidemia, male gender, obesity, and age greater than 29  Cath/PCI:  LHC (05/29/14): LMCA occluded distally. LAD ostial occluded. LCx ostially occluded. RCA proximally occluded. Widely patent LIMA to LAD, SVG to OM, and SVG to distal RCA.  CV Surgery:  Endovascular AAA repair (07/08/14, Dr. Scot [name])  CABG (1999): LIMA to LAD, SVG to OM, and SVG to distal RCA  EP Procedures and Devices:  None  Non-Invasive Evaluation(s):  Exercise MPI  (01/12/17): Intermediate risk study with small in size, mild in severity mid anteroseptal defect consistent with scar. Small in size mild severity, mid anterior defect that is reversible and consistent with ischemia in the diagonal territory. LVEF 47%. Overall, study is stable to improved compared with the previous test in 2015.  Pharmacologic MPI (05/16/14): Intermediate risk study with reversible anterior and apical ischemia as well as fixed basal to mid inferior defect. LVEF 48% with basal to mid inferior hypokinesis.  TTE (05/16/14): Normal LV size and wall thickness with LVEF of 55-60%. Mild left atrial enlargement. Normal RV size and function. Mitral annular calcification with mildly thickened leaflets. Aortic sclerosis.  Recent CV Pertinent Labs: Lab Results  Component Value Date   CHOL 113 08/14/2017   HDL 42 08/14/2017   LDLCALC 47 08/14/2017   LDLDIRECT 68.0 04/13/2015   TRIG 121 08/14/2017   TRIG 256 09/14/2009   CHOLHDL 3 12/07/2016   INR 1.18 07/08/2014   K 4.6 08/14/2017   MG 1.8 07/08/2014   BUN 17 08/14/2017   CREATININE 1.05 08/14/2017   CREATININE 1.08 12/14/2015    Past medical and surgical history were reviewed and updated in EPIC.  Current Meds  Medication Sig  . amLODipine (NORVASC) 5 MG tablet Take 1 tablet (5 mg total) by mouth daily.  Marland Kitchen atorvastatin (LIPITOR) 40 MG tablet Take 1 tablet (40 mg total) by mouth at bedtime.  . B Complex-Biotin-FA (B-COMPLEX PO) Take 1 tablet by mouth daily.  Marland Kitchen buPROPion (WELLBUTRIN SR) 150 MG 12 hr tablet Take 1 tablet (150 mg total) by mouth daily.  . Cholecalciferol (VITAMIN D3) 5000 UNITS CAPS Take 5,000 Units by mouth daily.  Marland Kitchen co-enzyme Q-10 30 MG capsule Take  30 mg by mouth daily.  Marland Kitchen ELIQUIS 5 MG TABS tablet TAKE 1 TABLET TWICE DAILY  . fenofibrate (TRICOR) 48 MG tablet TAKE 1 TABLET EVERY DAY  . hydrocortisone 2.5 % cream Apply 1 application topically as needed.  Marland Kitchen levothyroxine (SYNTHROID, LEVOTHROID) 25 MCG tablet Take  1 tablet (25 mcg total) by mouth daily before breakfast.  . lisinopril (PRINIVIL,ZESTRIL) 10 MG tablet Take 1 tablet (10 mg total) by mouth daily.  . magnesium oxide (MAG-OX) 400 MG tablet Take 800 mg by mouth daily.  . Melatonin 10 MG CAPS Take 10 mg at bedtime by mouth.  . Multiple Vitamins-Minerals (MULTIVITAMIN WITH MINERALS) tablet Take 1 tablet by mouth daily.  . Omega-3 Fatty Acids (FISH OIL) 1200 MG CAPS Take 1,200 mg by mouth daily.  . polyethylene glycol (MIRALAX / GLYCOLAX) packet Take 17 g by mouth daily.   Current Facility-Administered Medications for the 12/22/17 encounter (Office Visit) with Kieron Kantner, Harrell Gave, MD  Medication  . triamcinolone acetonide (KENALOG) 10 MG/ML injection 10 mg    Allergies: Patient has no known allergies.  Social History   Socioeconomic History  . Marital status: Married    Spouse name: Not on file  . Number of children: 3  . Years of education: 68  . Highest education level: Not on file  Social Needs  . Financial resource strain: Not on file  . Food insecurity - worry: Not on file  . Food insecurity - inability: Not on file  . Transportation needs - medical: Not on file  . Transportation needs - non-medical: Not on file  Occupational History  . Occupation: retired, Actor  Tobacco Use  . Smoking status: Former Smoker    Types: Cigarettes    Last attempt to quit: 1979    Years since quitting: 40.1  . Smokeless tobacco: Never Used  . Tobacco comment: quit smoking in 1979  Substance and Sexual Activity  . Alcohol use: Yes    Comment: occasionally - once per week  . Drug use: No  . Sexual activity: Not Currently  Other Topics Concern  . Not on file  Social History Narrative   Lives w/ wife in a 2 story home.  Has one son and 2 stepchildren.  Retired for [employer].     Education: high school.    Family History  Problem Relation Age of Onset  . Prostate cancer Father 20  . Heart disease Father   . Skin cancer Father   . Heart  attack Father   . Cancer Father   . Deep vein thrombosis Father   . Hyperlipidemia Father   . Hypertension Father   . Heart disease Mother   . Skin cancer Mother   . Heart attack Mother   . Cancer Mother   . Hyperlipidemia Mother   . Hypertension Mother   . Varicose Veins Mother   . Peripheral vascular disease Mother        amputation  . Sleep apnea Brother   . Hyperlipidemia Brother   . Hypertension Brother   . Colon cancer Other        aunt  . Skin cancer Brother   . Skin cancer Sister   . Cancer Sister   . Heart disease Sister   . Diabetes Neg Hx   . Stroke Neg Hx     Review of Systems: A 12-system review of systems was performed and was negative except as noted in the HPI.  --------------------------------------------------------------------------------------------------  Physical Exam: BP 128/70  Pulse (!) 57   Ht 5\' 9"  (1.753 m)   Wt 220 lb 6.4 oz (100 kg)   SpO2 99%   BMI 32.55 kg/m   General:  Obese man, seated comfortably in the exam room. HEENT: No conjunctival pallor or scleral icterus. Moist mucous membranes.  OP clear. Neck: Supple without lymphadenopathy, thyromegaly, JVD, or HJR. Lungs: Normal work of breathing. Clear to auscultation bilaterally without wheezes or crackles. Heart: Regular rate and rhythm without murmurs, rubs, or gallops. Non-displaced PMI. Abd: Bowel sounds present. Soft, NT/ND without hepatosplenomegaly Ext: 1+ distal calf edema bilaterally. Radial, PT, and DP pulses are 2+ bilaterally. Skin: Warm and dry without rash.  EKG:  Normal sinus rhythm with first-degree AV block (PR interval 226 ms) and LVH. No significant change from prior tracing.  Lab Results  Component Value Date   WBC 4.7 01/18/2017   HGB 15.0 01/18/2017   HCT 43.9 01/18/2017   MCV 92 01/18/2017   PLT 185.0 12/07/2016    Lab Results  Component Value Date   NA 144 08/14/2017   K 4.6 08/14/2017   CL 104 08/14/2017   CO2 26 08/14/2017   BUN 17 08/14/2017    CREATININE 1.05 08/14/2017   GLUCOSE 95 08/14/2017   ALT 19 08/14/2017    Lab Results  Component Value Date   CHOL 113 08/14/2017   HDL 42 08/14/2017   LDLCALC 47 08/14/2017   LDLDIRECT 68.0 04/13/2015   TRIG 121 08/14/2017   CHOLHDL 3 12/07/2016    --------------------------------------------------------------------------------------------------  ASSESSMENT AND PLAN: Leg edema Mild lower extremity edema noted on exam today with accompanying 9 pound weight gain over the last 2 weeks. Cause of this is unclear. We discussed importance of sodium restriction. We have agreed to obtain a transthoracic echocardiogram to exclude cardiomyopathy and significant valvular abnormalities. LVEF was preserved in 2015 by echo. However, subsequent stress tests have shown mildly reduced LV contraction. Exam today is unrevealing except for 1+ lower extremity edema bilaterally.  Coronary artery disease without angina No symptoms to suggest worsening coronary insufficiency. I have encouraged Mr. Lincoln [name] to remain active. We will continue his current medications for secondary prevention.  Paroxysmal atrial fibrillation No symptoms. Philip [name] is tolerating apixaban and metoprolol well. No medication changes today.  Hypertension Blood pressure is adequately controlled.  Hyperlipidemia Most recent LDL in 07/2017 at goal. Continue current medications.  AAA Continue secondary prevention and follow-up with vascular surgery.  Follow-up: Return to clinic in 6 months.  Nelva Bush, MD 12/22/2017 8:18 AM

## 2017-12-22 ENCOUNTER — Encounter: Payer: Self-pay | Admitting: Internal Medicine

## 2017-12-22 ENCOUNTER — Ambulatory Visit: Payer: Medicare HMO | Admitting: Internal Medicine

## 2017-12-22 VITALS — BP 128/70 | HR 57 | Ht 69.0 in | Wt 220.4 lb

## 2017-12-22 DIAGNOSIS — I480 Paroxysmal atrial fibrillation: Secondary | ICD-10-CM | POA: Diagnosis not present

## 2017-12-22 DIAGNOSIS — E785 Hyperlipidemia, unspecified: Secondary | ICD-10-CM | POA: Diagnosis not present

## 2017-12-22 DIAGNOSIS — I2510 Atherosclerotic heart disease of native coronary artery without angina pectoris: Secondary | ICD-10-CM

## 2017-12-22 DIAGNOSIS — I10 Essential (primary) hypertension: Secondary | ICD-10-CM | POA: Diagnosis not present

## 2017-12-22 DIAGNOSIS — I714 Abdominal aortic aneurysm, without rupture, unspecified: Secondary | ICD-10-CM

## 2017-12-22 DIAGNOSIS — R600 Localized edema: Secondary | ICD-10-CM | POA: Diagnosis not present

## 2017-12-22 NOTE — Patient Instructions (Addendum)
Medication Instructions:  Your physician recommends that you continue on your current medications as directed. Please refer to the Current Medication list given to you today.  -- If you need a refill on your cardiac medications before your next appointment, please call your pharmacy. --  Labwork: None ordered  Testing/Procedures: Your physician has requested that you have an echocardiogram. Echocardiography is a painless test that uses sound waves to create images of your heart. It provides your doctor with information about the size and shape of your heart and how well your heart's chambers and valves are working. This procedure takes approximately one hour. There are no restrictions for this procedure.   Follow-Up: Your physician wants you to follow-up in: 6 MONTHS with Dr. Saunders Revel.    You will receive a reminder letter in the mail two months in advance. If you don't receive a letter, please call our office to schedule the follow-up appointment.  Thank you for choosing [facility]!!    Any Other Special Instructions Will Be Listed Below (If Applicable).

## 2017-12-23 ENCOUNTER — Other Ambulatory Visit (INDEPENDENT_AMBULATORY_CARE_PROVIDER_SITE_OTHER): Payer: Self-pay | Admitting: Physician Assistant

## 2017-12-23 DIAGNOSIS — F3289 Other specified depressive episodes: Secondary | ICD-10-CM

## 2017-12-25 ENCOUNTER — Encounter (INDEPENDENT_AMBULATORY_CARE_PROVIDER_SITE_OTHER): Payer: Self-pay | Admitting: Physician Assistant

## 2017-12-25 ENCOUNTER — Ambulatory Visit (INDEPENDENT_AMBULATORY_CARE_PROVIDER_SITE_OTHER): Payer: Medicare HMO | Admitting: Physician Assistant

## 2017-12-25 VITALS — BP 143/80 | HR 54 | Temp 98.1°F | Ht 69.0 in | Wt 211.0 lb

## 2017-12-25 DIAGNOSIS — Z6831 Body mass index (BMI) 31.0-31.9, adult: Secondary | ICD-10-CM

## 2017-12-25 DIAGNOSIS — E038 Other specified hypothyroidism: Secondary | ICD-10-CM

## 2017-12-25 DIAGNOSIS — E669 Obesity, unspecified: Secondary | ICD-10-CM

## 2017-12-25 DIAGNOSIS — F3289 Other specified depressive episodes: Secondary | ICD-10-CM | POA: Diagnosis not present

## 2017-12-25 DIAGNOSIS — E66811 Obesity, class 1: Secondary | ICD-10-CM

## 2017-12-25 DIAGNOSIS — I10 Essential (primary) hypertension: Secondary | ICD-10-CM | POA: Diagnosis not present

## 2017-12-25 MED ORDER — BUPROPION HCL ER (SR) 150 MG PO TB12: 150.0000 mg | ORAL_TABLET | Freq: Every day | ORAL | 0 refills | Status: DC

## 2017-12-25 MED ORDER — LEVOTHYROXINE SODIUM 25 MCG PO TABS: 25.0000 ug | ORAL_TABLET | Freq: Every day | ORAL | 0 refills | Status: DC

## 2017-12-25 NOTE — Progress Notes (Signed)
Office: [phone]  /  Fax: [phone]   HPI:   Chief Complaint: OBESITY Philip [name] is here to discuss his progress with his obesity treatment plan. He is on the  follow the Category 2 plan and is following his eating plan approximately 75 % of the time. He states he is walking and playing golf for  60 minutes 5 times per week. Zimri maintained his weight. He would like to try a different meal plan, as he would like to incorporate more variety with his meals.  His weight is 211 lb (95.7 kg) today and has maintained his weight since his last visit. He has lost 19 lbs since starting treatment with [place].  Hypertension Philip [name] is a 76 y.o. male with hypertension.  Philip [name] denies chest pain or shortness of breath on exertion. He is working weight loss to help control his blood [name] with the goal of decreasing his risk of heart attack and stroke. Philip [name] is not currently controlled. He declines any adjustment to his medications today.   Hypothyroid Philip [name] has a diagnosis of hypothyroidism. He is on levothyroxine. He denies hot or cold intolerance or palpitations, but does admit to ongoing fatigue.  Depression with emotional eating behaviors Philip [name] is struggling with emotional eating and using food for comfort to the extent that it is negatively impacting his health. He often snacks when he is not hungry. Philip [name] sometimes feels he is out of control and then feels guilty that he made poor food choices. He has been working on behavior modification techniques to help reduce his emotional eating and has been somewhat successful. He shows no sign of suicidal or homicidal ideations.  Depression screen Philip [name] 2/9 01/18/2017 12/06/2016 12/21/2015 06/19/2015 04/22/2014  Decreased Interest 3 1 0 0 0  Down, Depressed, Hopeless 1 0 0 0 0  PHQ - 2 Score 4 1 0 0 0  Altered sleeping 2 - - - -  Tired, decreased energy 2 - - - -  Change in appetite 1 - - - -  Feeling bad or failure about  yourself  2 - - - -  Trouble concentrating 1 - - - -  Moving slowly or fidgety/restless 1 - - - -  Suicidal thoughts 0 - - - -  PHQ-9 Score 13 - - - -     ALLERGIES: No Known Allergies  MEDICATIONS: Current Outpatient Medications on File Prior to Visit  Medication Sig Dispense Refill  . amLODipine (NORVASC) 5 MG tablet Take 1 tablet (5 mg total) by mouth daily. 90 tablet 3  . atorvastatin (LIPITOR) 40 MG tablet Take 1 tablet (40 mg total) by mouth at bedtime. 90 tablet 2  . B Complex-Biotin-FA (B-COMPLEX PO) Take 1 tablet by mouth daily.    Marland Kitchen buPROPion (WELLBUTRIN SR) 150 MG 12 hr tablet Take 1 tablet (150 mg total) by mouth daily. 30 tablet 0  . Cholecalciferol (VITAMIN D3) 5000 UNITS CAPS Take 5,000 Units by mouth daily.    Marland Kitchen co-enzyme Q-10 30 MG capsule Take 30 mg by mouth daily.    Marland Kitchen ELIQUIS 5 MG TABS tablet TAKE 1 TABLET TWICE DAILY 180 tablet 1  . fenofibrate (TRICOR) 48 MG tablet TAKE 1 TABLET EVERY DAY 90 tablet 2  . hydrocortisone 2.5 % cream Apply 1 application topically as needed.    Marland Kitchen levothyroxine (SYNTHROID, LEVOTHROID) 25 MCG tablet Take 1 tablet (25 mcg total) by mouth daily before breakfast. 30 tablet 0  . lisinopril (PRINIVIL,ZESTRIL) 10  MG tablet Take 1 tablet (10 mg total) by mouth daily. 90 tablet 3  . magnesium oxide (MAG-OX) 400 MG tablet Take 800 mg by mouth daily.    . Melatonin 10 MG CAPS Take 10 mg at bedtime by mouth.    . Multiple Vitamins-Minerals (MULTIVITAMIN WITH MINERALS) tablet Take 1 tablet by mouth daily.    . Omega-3 Fatty Acids (FISH OIL) 1200 MG CAPS Take 1,200 mg by mouth daily.    . polyethylene glycol (MIRALAX / GLYCOLAX) packet Take 17 g by mouth daily.    . metoprolol succinate (TOPROL-XL) 50 MG 24 hr tablet Take 1 tablet (50 mg total) by mouth daily. Take with or immediately following a meal. 90 tablet 1   Current Facility-Administered Medications on File Prior to Visit  Medication Dose Route Frequency Provider Last Rate Last Dose  .  triamcinolone acetonide (KENALOG) 10 MG/ML injection 10 mg  10 mg Other Once Trula Slade, DPM        PAST MEDICAL HISTORY: Past Medical History:  Diagnosis Date  . AAA (abdominal aortic aneurysm) ([place])   . Atrial fibrillation ([place])   . CAD (coronary artery disease)    had a MI , s/p CABG  . Cataracts, bilateral    immature  . CHF (congestive heart failure) ([place])   . Dysrhythmia    paroximal a fib  . GERD (gastroesophageal reflux disease)    takes Omeprazole daily  . History of colon polyps   . History of kidney stones   . History of shingles   . Hyperlipidemia   . Hypertension    takes Amlodipine,Metoprolol,and Lisinopril daily  . Joint pain   . Joint swelling   . Myocardial infarction [facility])    several with last one being 2001(but never knew about any except 2001)  . OSA (obstructive sleep apnea)    started CPAP 12/09  . Peripheral vascular disease ([place])   . Pneumonia    as a child  . Skin cancer    several , one of them was melanoma (sees derm routinely)  . Tingling    feet occasionally  . Tinnitus     PAST SURGICAL HISTORY: Past Surgical History:  Procedure Laterality Date  . ABDOMINAL AORTAGRAM N/A 06/16/2014   Procedure: ABDOMINAL Maxcine Ham;  Surgeon: Angelia Mould, MD;  [facility] C[facility] LAB;  Service: Cardiovascular;  Laterality: N/A;  . ABDOMINAL AORTIC ENDOVASCULAR STENT GRAFT N/A 07/08/2014   Procedure: ABDOMINAL AORTIC ENDOVASCULAR STENT GRAFT/ GORE;  Surgeon: Angelia Mould, MD;  Location: [place];  Service: Vascular;  Laterality: N/A;  . CARDIAC CATHETERIZATION  2015  . COLONOSCOPY    . CORONARY ARTERY BYPASS GRAFT  1999   x 3 vessels  . LEFT HEART CATHETERIZATION WITH CORONARY/GRAFT ANGIOGRAM N/A 05/29/2014   Procedure: LEFT HEART CATHETERIZATION WITH [name];  Surgeon: [name], MD;  Location: [facility] CATH LAB;  Service: Cardiovascular;  Laterality: N/A;  . PENILE PROSTHESIS IMPLANT  08/2005  . RHINOPLASTY  1975     SOCIAL HISTORY: Social History   Tobacco Use  . Smoking status: Former Smoker    Types: Cigarettes    Last attempt to quit: 1979    Years since quitting: 40.1  . Smokeless tobacco: Never Used  . Tobacco comment: quit smoking in 1979  Substance Use Topics  . Alcohol use: Yes    Comment: occasionally - once per week  . Drug use: No    FAMILY HISTORY: Family History  Problem Relation Age  of Onset  . Prostate cancer Father 95  . Heart disease Father   . Skin cancer Father   . Heart attack Father   . Cancer Father   . Deep vein thrombosis Father   . Hyperlipidemia Father   . Hypertension Father   . Heart disease Mother   . Skin cancer Mother   . Heart attack Mother   . Cancer Mother   . Hyperlipidemia Mother   . Hypertension Mother   . Varicose Veins Mother   . Peripheral vascular disease Mother        amputation  . Sleep apnea Brother   . Hyperlipidemia Brother   . Hypertension Brother   . Colon cancer Other        aunt  . Skin cancer Brother   . Skin cancer Sister   . Cancer Sister   . Heart disease Sister   . Diabetes Neg Hx   . Stroke Neg Hx     ROS: Review of Systems  Constitutional: Positive for malaise/fatigue and weight loss.  Respiratory: Negative for shortness of breath.   Cardiovascular: Negative for chest pain and palpitations.  Endo/Heme/Allergies:       Negative for hot/cold intolerance   Psychiatric/Behavioral: Positive for depression. Negative for suicidal ideas.       Negative for homicidal ideations.     PHYSICAL EXAM: Blood [name] (!) 143/80, pulse (!) 54, temperature 98.1 F (36.7 C), temperature source Oral, height 5\' 9"  (1.753 m), weight 211 lb (95.7 kg), SpO2 99 %. Body mass index is 31.16 kg/m. Physical Exam  Constitutional: He is oriented to person, place, and time. He appears well-developed and well-nourished.  HENT:  Head: Normocephalic.  Eyes: EOM are normal.  Neck: Normal range of motion.  Cardiovascular:  Bradycardia present.  Pulmonary/Chest: Effort normal.  Musculoskeletal: Normal range of motion.  Neurological: He is alert and oriented to person, place, and time.  Skin: Skin is warm and dry.  Psychiatric: He has a normal mood and affect. His behavior is normal.  Vitals reviewed.   RECENT LABS AND TESTS: BMET    Component Value Date/Time   NA 144 08/14/2017 0913   K 4.6 08/14/2017 0913   CL 104 08/14/2017 0913   CO2 26 08/14/2017 0913   GLUCOSE 95 08/14/2017 0913   GLUCOSE 107 (H) 12/07/2016 0852   BUN 17 08/14/2017 0913   CREATININE 1.05 08/14/2017 0913   CREATININE 1.08 12/14/2015 1122   CALCIUM 9.5 08/14/2017 0913   GFRNONAA 68 08/14/2017 0913   GFRAA 79 08/14/2017 0913   Lab Results  Component Value Date   HGBA1C 5.6 08/14/2017   HGBA1C 5.7 (H) 04/19/2017   HGBA1C 5.9 (H) 01/18/2017   HGBA1C 6.1 12/07/2016   Lab Results  Component Value Date   INSULIN 10.8 08/14/2017   INSULIN 17.3 04/19/2017   INSULIN 16.1 01/18/2017   CBC    Component Value Date/Time   WBC 4.7 01/18/2017 0938   WBC 5.6 12/07/2016 0852   RBC 4.76 01/18/2017 0938   RBC 4.72 12/07/2016 0852   HGB 15.0 01/18/2017 0938   HCT 43.9 01/18/2017 0938   PLT 185.0 12/07/2016 0852   PLT 156 04/07/2010   MCV 92 01/18/2017 0938   MCH 31.5 01/18/2017 0938   MCH 31.1 12/14/2015 1122   MCHC 34.2 01/18/2017 0938   MCHC 34.3 12/07/2016 0852   RDW 13.1 01/18/2017 0938   LYMPHSABS 2.0 01/18/2017 0938   MONOABS 0.4 12/07/2016 0852   EOSABS 0.1 01/18/2017 6160  BASOSABS 0.0 01/18/2017 0938   Iron/TIBC/Ferritin/ %Sat No results found for: IRON, TIBC, FERRITIN, IRONPCTSAT Lipid Panel     Component Value Date/Time   CHOL 113 08/14/2017 0913   TRIG 121 08/14/2017 0913   TRIG 256 09/14/2009 0000   HDL 42 08/14/2017 0913   CHOLHDL 3 12/07/2016 0852   VLDL 30.8 12/07/2016 0852   LDLCALC 47 08/14/2017 0913   LDLDIRECT 68.0 04/13/2015 0823   Hepatic Function Panel     Component Value Date/Time    PROT 6.6 08/14/2017 0913   ALBUMIN 4.6 08/14/2017 0913   AST 22 08/14/2017 0913   ALT 19 08/14/2017 0913   ALKPHOS 61 08/14/2017 0913   BILITOT 0.6 08/14/2017 0913      Component Value Date/Time   TSH 2.430 10/19/2017 0906   TSH 4.880 (H) 08/14/2017 0913   TSH 5.110 (H) 04/19/2017 0818    ASSESSMENT AND PLAN: Essential hypertension  Other specified hypothyroidism - Plan: levothyroxine (SYNTHROID, LEVOTHROID) 25 MCG tablet  Other depression - with emotional eating - Plan: buPROPion (WELLBUTRIN SR) 150 MG 12 hr tablet  Class 1 obesity with serious comorbidity and body mass index (BMI) of 31.0 to 31.9 in adult, unspecified obesity type  PLAN: Hypertension We discussed sodium restriction, working on healthy weight loss, and a regular exercise program as the means to achieve improved blood [name] control. Kaius agreed with this plan and agreed to follow up as directed. We will continue to monitor his blood [name] as well as his progress with the above lifestyle modifications. He will continue his medications as prescribed and will watch for signs of hypotension as he continues his lifestyle modifications.  Hypothyroid Philip [name] was informed of the importance of good thyroid control to help with weight loss efforts. He was also informed that supertheraputic thyroid levels are dangerous and will not improve weight loss results. He agrees to continue levothyroxine 25 mcg #30 with no refills. He agrees to follow up in our clinic in 2 weeks.   Depression with Emotional Eating Behaviors We discussed behavior modification techniques today to help Philip [name] deal with his emotional eating and depression. He has agreed to continue Wellbutrin SR 150 mg qd #30 with no refills and agreed to follow up as directed.  Obesity Philip [name] is currently in the action stage of change. As such, his goal is to continue with weight loss efforts He has agreed to follow the pescatarian meal plan. Philip [name] has been  instructed to work up to a goal of 150 minutes of combined cardio and strengthening exercise per week for weight loss and overall health benefits. We discussed the following Behavioral Modification Stratagies today: increasing lean protein intake and work on meal planning and easy cooking plans   Philip [name] has agreed to follow up with our clinic in 2 weeks. He was informed of the importance of frequent follow up visits to maximize his success with intensive lifestyle modifications for his multiple health conditions.   Today's visit was # 19 out of 22.  Starting weight: 230 lbs Starting date: 01/18/17 Today's weight :211 lbs Today's date: 12/25/2017 Total lbs lost to date: 16 (Patients must lose 7 lbs in the first 6 months to continue with counseling)   ASK: We discussed the diagnosis of obesity with Philip [name] today and Philip [name] agreed to give [place] permission to discuss obesity behavioral modification therapy today.  ASSESS: Philip [name] has the diagnosis of obesity and his BMI today is 31.16 Philip [name] is in the action stage of change  ADVISE: Philip [name] was educated on the multiple health risks of obesity as well as the benefit of weight loss to improve his health. He was advised of the need for long term treatment and the importance of lifestyle modifications.  AGREE: Multiple dietary modification options and treatment options were discussed and  Philip [name] agreed to the above obesity treatment plan.   Philip [name], am acting as transcriptionist for [employer], PA-C I, Lacy Duverney [facility], have reviewed this note and agree with its content.

## 2017-12-27 ENCOUNTER — Ambulatory Visit (HOSPITAL_COMMUNITY): Payer: Medicare HMO

## 2018-01-01 ENCOUNTER — Encounter: Payer: Self-pay | Admitting: Internal Medicine

## 2018-01-01 ENCOUNTER — Ambulatory Visit (INDEPENDENT_AMBULATORY_CARE_PROVIDER_SITE_OTHER): Payer: Medicare HMO | Admitting: Internal Medicine

## 2018-01-01 VITALS — BP 128/72 | HR 50 | Temp 97.5°F | Resp 16 | Ht 69.0 in | Wt 213.2 lb

## 2018-01-01 DIAGNOSIS — Z0000 Encounter for general adult medical examination without abnormal findings: Secondary | ICD-10-CM

## 2018-01-01 DIAGNOSIS — Z125 Encounter for screening for malignant neoplasm of prostate: Secondary | ICD-10-CM | POA: Diagnosis not present

## 2018-01-01 LAB — BASIC METABOLIC PANEL
BUN: 21 mg/dL (ref 6–23)
CHLORIDE: 103 meq/L (ref 96–112)
CO2: 31 meq/L (ref 19–32)
CREATININE: 1.18 mg/dL (ref 0.40–1.50)
Calcium: 10.1 mg/dL (ref 8.4–10.5)
GFR: 63.52 mL/min (ref >60.00)
GLUCOSE: 99 mg/dL (ref 70–99)
POTASSIUM: 5.1 meq/L (ref 3.5–5.1)
Sodium: 140 mEq/L (ref 135–145)

## 2018-01-01 LAB — CBC WITH DIFFERENTIAL/PLATELET
BASOS PCT: 0.9 % (ref 0.0–3.0)
Basophils Absolute: 0 10*3/uL (ref 0.0–0.1)
EOS ABS: 0.1 10*3/uL (ref 0.0–0.7)
EOS PCT: 2.1 % (ref 0.0–5.0)
HCT: 44.6 % (ref 39.0–52.0)
HEMOGLOBIN: 15.2 g/dL (ref 13.0–17.0)
LYMPHS ABS: 1.6 10*3/uL (ref 0.7–4.0)
Lymphocytes Relative: 35.7 % (ref 12.0–46.0)
MCHC: 34.1 g/dL (ref 30.0–36.0)
MCV: 93.6 fl (ref 78.0–100.0)
MONO ABS: 0.4 10*3/uL (ref 0.1–1.0)
Monocytes Relative: 8.9 % (ref 3.0–12.0)
NEUTROS ABS: 2.4 10*3/uL (ref 1.4–7.7)
Neutrophils Relative %: 52.4 % (ref 43.0–77.0)
PLATELETS: 153 10*3/uL (ref 150.0–400.0)
RBC: 4.77 Mil/uL (ref 4.22–5.81)
RDW: 12.9 % (ref 11.5–15.5)
WBC: 4.6 10*3/uL (ref 4.0–10.5)

## 2018-01-01 LAB — PSA: PSA: 3.59 ng/mL (ref 0.10–4.00)

## 2018-01-01 NOTE — Assessment & Plan Note (Addendum)
-  Td :2015;pnm 23:   2012;  prevnar :2015; shingrix not available; s/p zostavax ( [employer])  -CCS: Several Cscopes, had one in 2007 aprox. polyps x 6 ,  03-2009, hyperplastic polyps, 09-2014: polyps, pt told 5 years He has a family history of prostate cancer:  Pro/cons of screening carefully discussed, his 78 year old brother was just diagnosed with prostate cancer, father had prostate cancer.  We agreed to proceed.  DRE with a ? of noticeable lobule on the right.  Check a PSA. -Diet and exercise: Doing well -Labs: Good, will check a BMP, CBC, PSA

## 2018-01-01 NOTE — Progress Notes (Signed)
Subjective:    Patient ID: Philip [name], male    DOB: 07-13-1941, 77 y.o.   MRN: [id number]  DOS:  01/01/2018 Type of visit - description : cpx Interval history: Here for CPX, in general feels well   Wt Readings from Last 3 Encounters:  01/01/18 213 lb 3.2 oz (96.7 kg)  12/25/17 211 lb (95.7 kg)  12/22/17 220 lb 6.4 oz (100 kg)     Review of Systems His 61 year old brother was just diagnosed with prostate cancer.  Patient denies dysuria, gross hematuria difficulty urinating Denies chest pain, difficulty breathing, occasionally has palpitations with exertion. Reports left lateral hip pain on and off, not consistent, area is no TTP.   Other than above, a 14 point review of systems is negative      Past Medical History:  Diagnosis Date  . AAA (abdominal aortic aneurysm) ([place])   . Atrial fibrillation ([place])   . CAD (coronary artery disease)    had a MI , s/p CABG  . Cataracts, bilateral    immature  . CHF (congestive heart failure) ([place])   . Dysrhythmia    paroximal a fib  . GERD (gastroesophageal reflux disease)    takes Omeprazole daily  . History of colon polyps   . History of kidney stones   . History of shingles   . Hyperlipidemia   . Hypertension    takes Amlodipine,Metoprolol,and Lisinopril daily  . Joint pain   . Joint swelling   . Myocardial infarction [facility])    several with last one being 2001(but never knew about any except 2001)  . OSA (obstructive sleep apnea)    started CPAP 12/09  . Peripheral vascular disease ([place])   . Pneumonia    as a child  . Skin cancer    several , one of them was melanoma (sees derm routinely)  . Tingling    feet occasionally  . Tinnitus     Past Surgical History:  Procedure Laterality Date  . ABDOMINAL AORTAGRAM N/A 06/16/2014   Procedure: ABDOMINAL Maxcine Ham;  Surgeon: Angelia Mould, MD;  Location: [facility] CATH LAB;  Service: Cardiovascular;  Laterality: N/A;  . ABDOMINAL AORTIC ENDOVASCULAR STENT GRAFT N/A  07/08/2014   Procedure: ABDOMINAL AORTIC ENDOVASCULAR STENT GRAFT/ GORE;  Surgeon: Angelia Mould, MD;  Location: [place];  Service: Vascular;  Laterality: N/A;  . CARDIAC CATHETERIZATION  2015  . COLONOSCOPY    . CORONARY ARTERY BYPASS GRAFT  1999   x 3 vessels  . LEFT HEART CATHETERIZATION WITH CORONARY/GRAFT ANGIOGRAM N/A 05/29/2014   Procedure: LEFT HEART CATHETERIZATION WITH [name];  Surgeon: [name], MD;  Location: [facility] CATH LAB;  Service: Cardiovascular;  Laterality: N/A;  . PENILE PROSTHESIS IMPLANT  08/2005  . RHINOPLASTY  1975   Family History  Problem Relation Age of Onset  . Prostate cancer Father 30  . Heart disease Father   . Skin cancer Father   . Heart attack Father   . Cancer Father   . Deep vein thrombosis Father   . Hyperlipidemia Father   . Hypertension Father   . Heart disease Mother   . Skin cancer Mother   . Heart attack Mother   . Cancer Mother   . Hyperlipidemia Mother   . Hypertension Mother   . Varicose Veins Mother   . Peripheral vascular disease Mother        amputation  . Sleep apnea Brother   . Hyperlipidemia Brother   . Hypertension Brother   .  Prostate cancer Brother 26  . Colon cancer Other        aunt  . Skin cancer Brother   . Skin cancer Sister   . Cancer Sister   . Heart disease Sister   . Diabetes Neg Hx   . Stroke Neg Hx     Social History   Socioeconomic History  . Marital status: Married    Spouse name: Not on file  . Number of children: 3  . Years of education: 14  . Highest education level: Not on file  Social Needs  . Financial resource strain: Not on file  . Food insecurity - worry: Not on file  . Food insecurity - inability: Not on file  . Transportation needs - medical: Not on file  . Transportation needs - non-medical: Not on file  Occupational History  . Occupation: retired, Actor  Tobacco Use  . Smoking status: Former Smoker    Types: Cigarettes    Last attempt to quit:  1979    Years since quitting: 40.2  . Smokeless tobacco: Never Used  . Tobacco comment: quit smoking in 1979  Substance and Sexual Activity  . Alcohol use: Yes    Comment: occasionally - once per week  . Drug use: No  . Sexual activity: Not Currently  Other Topics Concern  . Not on file  Social History Narrative   Lives w/ wife in a 2 story home.  Has one son and 2 stepchildren.  Retired for [employer].     Education: high school.      Allergies as of 01/01/2018   No Known Allergies     Medication List        Accurate as of 01/01/18 11:59 PM. Always use your most recent med list.          amLODipine 5 MG tablet Commonly known as:  NORVASC Take 1 tablet (5 mg total) by mouth daily.   atorvastatin 40 MG tablet Commonly known as:  LIPITOR Take 1 tablet (40 mg total) by mouth at bedtime.   B-COMPLEX PO Take 1 tablet by mouth daily.   buPROPion 150 MG 12 hr tablet Commonly known as:  WELLBUTRIN SR Take 1 tablet (150 mg total) by mouth daily.   co-enzyme Q-10 30 MG capsule Take 30 mg by mouth daily.   ELIQUIS 5 MG Tabs tablet Generic drug:  apixaban TAKE 1 TABLET TWICE DAILY   fenofibrate 48 MG tablet Commonly known as:  TRICOR TAKE 1 TABLET EVERY DAY   Fish Oil 1200 MG Caps Take 1,200 mg by mouth daily.   hydrocortisone 2.5 % cream Apply 1 application topically as needed.   levothyroxine 25 MCG tablet Commonly known as:  SYNTHROID, LEVOTHROID Take 1 tablet (25 mcg total) by mouth daily before breakfast.   lisinopril 10 MG tablet Commonly known as:  PRINIVIL,ZESTRIL Take 1 tablet (10 mg total) by mouth daily.   magnesium oxide 400 MG tablet Commonly known as:  MAG-OX Take 800 mg by mouth daily.   Melatonin 10 MG Caps Take 10 mg at bedtime by mouth.   metoprolol succinate 50 MG 24 hr tablet Commonly known as:  TOPROL-XL Take 1 tablet (50 mg total) by mouth daily. Take with or immediately following a meal.   multivitamin with minerals tablet Take 1  tablet by mouth daily.   polyethylene glycol packet Commonly known as:  MIRALAX / GLYCOLAX Take 17 g by mouth daily.   Vitamin D3 5000 units Caps Take 5,000  Units by mouth daily.          Objective:   Physical Exam BP 128/72 (BP Location: Right Arm, Patient Position: Sitting, Cuff Size: Normal)   Pulse (!) 50   Temp (!) 97.5 F (36.4 C) (Oral)   Resp 16   Ht 5\' 9"  (1.753 m)   Wt 213 lb 3.2 oz (96.7 kg)   SpO2 96%   BMI 31.48 kg/m  General:   Well developed, well nourished . NAD.  Neck: No  thyromegaly  HEENT:  Normocephalic . Face symmetric, atraumatic Lungs:  CTA B Normal respiratory effort, no intercostal retractions, no accessory muscle use. Heart: Bradycardic, regular, no murmur.  No pretibial edema bilaterally  Abdomen:  Not distended, soft, non-tender. No rebound or rigidity.   Skin: Exposed areas without rash. Not pale. Not jaundice Rectal: External abnormalities: none. Normal sphincter tone. No rectal masses or tenderness.  No stools found Prostate: Prostate gland firm and smooth, no enlargement,no tenderness, mass, asymmetry or induration On the right side there is a 3-4 mm area that is slightly more noticeable, probably a prominent lobule not a nodule Neurologic:  alert & oriented X3.  Speech normal, gait appropriate for age and unassisted Strength symmetric and appropriate for age.  Psych: Cognition and judgment appear intact.  Cooperative with normal attention span and concentration.  Behavior appropriate. No anxious or depressed appearing.     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 Fib, paroxysmal  GERD  Chronic  constipation OSA- Cpap (Dr [name]) ED- penile implant H/o skin cancer, Dr. [name] H/o Kidney stones 2016  PLAN:  Chronic medical problems: Last A1c, FLP satisfactory.  BP seems to be well controlled.  He uses a CPAP consistently, recently visit with cardiology 12/22/2017: Was noted to be stable and no changes made. On thyroid for treatment of subclinical hypothyroidism, managed by weight management clinic.   Left hip pain: Recommend stretching, ice pack and Tylenol as needed.  Call if not better Were doing a CPX today RTC 8 months.

## 2018-01-01 NOTE — Patient Instructions (Signed)
GO TO THE LAB : Get the blood work     GO TO THE FRONT DESK Schedule your next appointment for a checkup in 8 months  Tylenol  500 mg OTC 2 tabs a day every 8 hours as needed for pain  Stretching before exercise  Call if the hip pain continue

## 2018-01-02 NOTE — Assessment & Plan Note (Signed)
Chronic medical problems: Last A1c, FLP satisfactory.  BP seems to be well controlled.  He uses a CPAP consistently, recently visit with cardiology 12/22/2017: Was noted to be stable and no changes made. On thyroid for treatment of subclinical hypothyroidism, managed by weight management clinic.   Left hip pain: Recommend stretching, ice pack and Tylenol as needed.  Call if not better Were doing a CPX today RTC 8 months.

## 2018-01-05 ENCOUNTER — Other Ambulatory Visit: Payer: Self-pay

## 2018-01-05 ENCOUNTER — Ambulatory Visit (HOSPITAL_COMMUNITY): Payer: Medicare HMO | Attending: Internal Medicine

## 2018-01-05 DIAGNOSIS — I4891 Unspecified atrial fibrillation: Secondary | ICD-10-CM | POA: Insufficient documentation

## 2018-01-05 DIAGNOSIS — I119 Hypertensive heart disease without heart failure: Secondary | ICD-10-CM | POA: Diagnosis not present

## 2018-01-05 DIAGNOSIS — G4733 Obstructive sleep apnea (adult) (pediatric): Secondary | ICD-10-CM | POA: Insufficient documentation

## 2018-01-05 DIAGNOSIS — R600 Localized edema: Secondary | ICD-10-CM | POA: Diagnosis not present

## 2018-01-05 DIAGNOSIS — E785 Hyperlipidemia, unspecified: Secondary | ICD-10-CM | POA: Diagnosis not present

## 2018-01-05 DIAGNOSIS — I714 Abdominal aortic aneurysm, without rupture: Secondary | ICD-10-CM | POA: Insufficient documentation

## 2018-01-05 DIAGNOSIS — I2510 Atherosclerotic heart disease of native coronary artery without angina pectoris: Secondary | ICD-10-CM | POA: Diagnosis not present

## 2018-01-08 ENCOUNTER — Ambulatory Visit (INDEPENDENT_AMBULATORY_CARE_PROVIDER_SITE_OTHER): Payer: Medicare HMO | Admitting: Physician Assistant

## 2018-01-08 VITALS — BP 132/72 | HR 50 | Temp 97.8°F | Ht 69.0 in | Wt 211.0 lb

## 2018-01-08 DIAGNOSIS — E669 Obesity, unspecified: Secondary | ICD-10-CM | POA: Diagnosis not present

## 2018-01-08 DIAGNOSIS — Z6831 Body mass index (BMI) 31.0-31.9, adult: Secondary | ICD-10-CM | POA: Diagnosis not present

## 2018-01-08 DIAGNOSIS — E038 Other specified hypothyroidism: Secondary | ICD-10-CM | POA: Diagnosis not present

## 2018-01-08 NOTE — Progress Notes (Signed)
Office: [phone]  /  Fax: [phone]   HPI:   Chief Complaint: OBESITY Philip [name] is here to discuss his progress with his obesity treatment plan. He is on the Pescatarian eating plan and is following his eating plan approximately 75 % of the time. He states he is walking for 30 to 60 minutes 7 times per week. Philip [name] maintained his weight. He states he is frustrated that he has not lost weight recently. Philip [name] admits he is not getting the required protein. Philip [name] is advised to see Dr. Leafy Ro in 2 weeks. His weight is 211 lb (95.7 kg) today and has maintained weight over a period of 2 weeks since his last visit. He has lost 19 lbs since starting treatment with [place].  Hypothyroidism Philip [name] has a diagnosis of hypothyroidism. He is on levothyroxine. He denies hot or cold intolerance, palpitations or fatigue.  ALLERGIES: No Known Allergies  MEDICATIONS: Current Outpatient Medications on File Prior to Visit  Medication Sig Dispense Refill  . amLODipine (NORVASC) 5 MG tablet Take 1 tablet (5 mg total) by mouth daily. 90 tablet 3  . atorvastatin (LIPITOR) 40 MG tablet Take 1 tablet (40 mg total) by mouth at bedtime. 90 tablet 2  . B Complex-Biotin-FA (B-COMPLEX PO) Take 1 tablet by mouth daily.    Marland Kitchen buPROPion (WELLBUTRIN SR) 150 MG 12 hr tablet Take 1 tablet (150 mg total) by mouth daily. 30 tablet 0  . Cholecalciferol (VITAMIN D3) 5000 UNITS CAPS Take 5,000 Units by mouth daily.    Marland Kitchen co-enzyme Q-10 30 MG capsule Take 30 mg by mouth daily.    Marland Kitchen ELIQUIS 5 MG TABS tablet TAKE 1 TABLET TWICE DAILY 180 tablet 1  . fenofibrate (TRICOR) 48 MG tablet TAKE 1 TABLET EVERY DAY 90 tablet 2  . hydrocortisone 2.5 % cream Apply 1 application topically as needed.    Marland Kitchen levothyroxine (SYNTHROID, LEVOTHROID) 25 MCG tablet Take 1 tablet (25 mcg total) by mouth daily before breakfast. 30 tablet 0  . lisinopril (PRINIVIL,ZESTRIL) 10 MG tablet Take 1 tablet (10 mg total) by mouth daily. 90 tablet 3  . magnesium  oxide (MAG-OX) 400 MG tablet Take 800 mg by mouth daily.    . Melatonin 10 MG CAPS Take 10 mg at bedtime by mouth.    . Multiple Vitamins-Minerals (MULTIVITAMIN WITH MINERALS) tablet Take 1 tablet by mouth daily.    . Omega-3 Fatty Acids (FISH OIL) 1200 MG CAPS Take 1,200 mg by mouth daily.    . polyethylene glycol (MIRALAX / GLYCOLAX) packet Take 17 g by mouth daily.    . metoprolol succinate (TOPROL-XL) 50 MG 24 hr tablet Take 1 tablet (50 mg total) by mouth daily. Take with or immediately following a meal. 90 tablet 1   Current Facility-Administered Medications on File Prior to Visit  Medication Dose Route Frequency Provider Last Rate Last Dose  . triamcinolone acetonide (KENALOG) 10 MG/ML injection 10 mg  10 mg Other Once Trula Slade, DPM        PAST MEDICAL HISTORY: Past Medical History:  Diagnosis Date  . AAA (abdominal aortic aneurysm) ([place])   . Atrial fibrillation ([place])   . CAD (coronary artery disease)    had a MI , s/p CABG  . Cataracts, bilateral    immature  . CHF (congestive heart failure) ([place])   . Dysrhythmia    paroximal a fib  . GERD (gastroesophageal reflux disease)    takes Omeprazole daily  . History of colon polyps   .  History of kidney stones   . History of shingles   . Hyperlipidemia   . Hypertension    takes Amlodipine,Metoprolol,and Lisinopril daily  . Joint pain   . Joint swelling   . Myocardial infarction [facility])    several with last one being 2001(but never knew about any except 2001)  . OSA (obstructive sleep apnea)    started CPAP 12/09  . Peripheral vascular disease ([place])   . Pneumonia    as a child  . Skin cancer    several , one of them was melanoma (sees derm routinely)  . Tingling    feet occasionally  . Tinnitus     PAST SURGICAL HISTORY: Past Surgical History:  Procedure Laterality Date  . ABDOMINAL AORTAGRAM N/A 06/16/2014   Procedure: ABDOMINAL Maxcine Ham;  Surgeon: Angelia Mould, MD;  Location[facility] CATH LAB;   Service: Cardiovascular;  Laterality: N/A;  . ABDOMINAL AORTIC ENDOVASCULAR STENT GRAFT N/A 07/08/2014   Procedure: ABDOMINAL AORTIC ENDOVASCULAR STENT GRAFT/ GORE;  Surgeon: Angelia Mould, MD;  Location: [place];  Service: Vascular;  Laterality: N/A;  . CARDIAC CATHETERIZATION  2015  . COLONOSCOPY    . CORONARY ARTERY BYPASS GRAFT  1999   x 3 vessels  . LEFT HEART CATHETERIZATION WITH CORONARY/GRAFT ANGIOGRAM N/A 05/29/2014   Procedure: LEFT HEART CATHETERIZATION WITH [name];  Surgeon: [name], MD;  Location: [facility] CATH LAB;  Service: Cardiovascular;  Laterality: N/A;  . PENILE PROSTHESIS IMPLANT  08/2005  . RHINOPLASTY  1975    SOCIAL HISTORY: Social History   Tobacco Use  . Smoking status: Former Smoker    Types: Cigarettes    Last attempt to quit: 1979    Years since quitting: 40.2  . Smokeless tobacco: Never Used  . Tobacco comment: quit smoking in 1979  Substance Use Topics  . Alcohol use: Yes    Comment: occasionally - once per week  . Drug use: No    FAMILY HISTORY: Family History  Problem Relation Age of Onset  . Prostate cancer Father 76  . Heart disease Father   . Skin cancer Father   . Heart attack Father   . Cancer Father   . Deep vein thrombosis Father   . Hyperlipidemia Father   . Hypertension Father   . Heart disease Mother   . Skin cancer Mother   . Heart attack Mother   . Cancer Mother   . Hyperlipidemia Mother   . Hypertension Mother   . Varicose Veins Mother   . Peripheral vascular disease Mother        amputation  . Sleep apnea Brother   . Hyperlipidemia Brother   . Hypertension Brother   . Prostate cancer Brother 39  . Colon cancer Other        aunt  . Skin cancer Brother   . Skin cancer Sister   . Cancer Sister   . Heart disease Sister   . Diabetes Neg Hx   . Stroke Neg Hx     ROS: Review of Systems  Constitutional: Negative for malaise/fatigue and weight loss.  Cardiovascular: Negative for  palpitations.  Endo/Heme/Allergies:       Negative for Heat or Cold Intolerance    PHYSICAL EXAM: Blood pressure 132/72, pulse (!) 50, temperature 97.8 F (36.6 C), temperature source Oral, height 5\' 9"  (1.753 m), weight 211 lb (95.7 kg), SpO2 98 %. Body mass index is 31.16 kg/m. Physical Exam  Constitutional: He is oriented to person, place, and  time. He appears well-developed and well-nourished.  Cardiovascular: Bradycardia present.  Pulmonary/Chest: Effort normal.  Musculoskeletal: Normal range of motion.  Neurological: He is oriented to person, place, and time.  Skin: Skin is warm and dry.  Psychiatric: He has a normal mood and affect. His behavior is normal.  Vitals reviewed.   RECENT LABS AND TESTS: BMET    Component Value Date/Time   NA 140 01/01/2018 1017   NA 144 08/14/2017 0913   K 5.1 01/01/2018 1017   CL 103 01/01/2018 1017   CO2 31 01/01/2018 1017   GLUCOSE 99 01/01/2018 1017   BUN 21 01/01/2018 1017   BUN 17 08/14/2017 0913   CREATININE 1.18 01/01/2018 1017   CREATININE 1.08 12/14/2015 1122   CALCIUM 10.1 01/01/2018 1017   GFRNONAA 68 08/14/2017 0913   GFRAA 79 08/14/2017 0913   Lab Results  Component Value Date   HGBA1C 5.6 08/14/2017   HGBA1C 5.7 (H) 04/19/2017   HGBA1C 5.9 (H) 01/18/2017   HGBA1C 6.1 12/07/2016   Lab Results  Component Value Date   INSULIN 10.8 08/14/2017   INSULIN 17.3 04/19/2017   INSULIN 16.1 01/18/2017   CBC    Component Value Date/Time   WBC 4.6 01/01/2018 1017   RBC 4.77 01/01/2018 1017   HGB 15.2 01/01/2018 1017   HGB 15.0 01/18/2017 0938   HCT 44.6 01/01/2018 1017   HCT 43.9 01/18/2017 0938   PLT 153.0 01/01/2018 1017   PLT 156 04/07/2010   MCV 93.6 01/01/2018 1017   MCV 92 01/18/2017 0938   MCH 31.5 01/18/2017 0938   MCH 31.1 12/14/2015 1122   MCHC 34.1 01/01/2018 1017   RDW 12.9 01/01/2018 1017   RDW 13.1 01/18/2017 0938   LYMPHSABS 1.6 01/01/2018 1017   LYMPHSABS 2.0 01/18/2017 0938   MONOABS 0.4  01/01/2018 1017   EOSABS 0.1 01/01/2018 1017   EOSABS 0.1 01/18/2017 0938   BASOSABS 0.0 01/01/2018 1017   BASOSABS 0.0 01/18/2017 0938   Iron/TIBC/Ferritin/ %Sat No results found for: IRON, TIBC, FERRITIN, IRONPCTSAT Lipid Panel     Component Value Date/Time   CHOL 113 08/14/2017 0913   TRIG 121 08/14/2017 0913   TRIG 256 09/14/2009 0000   HDL 42 08/14/2017 0913   CHOLHDL 3 12/07/2016 0852   VLDL 30.8 12/07/2016 0852   LDLCALC 47 08/14/2017 0913   LDLDIRECT 68.0 04/13/2015 0823   Hepatic Function Panel     Component Value Date/Time   PROT 6.6 08/14/2017 0913   ALBUMIN 4.6 08/14/2017 0913   AST 22 08/14/2017 0913   ALT 19 08/14/2017 0913   ALKPHOS 61 08/14/2017 0913   BILITOT 0.6 08/14/2017 0913      Component Value Date/Time   TSH 2.430 10/19/2017 0906   TSH 4.880 (H) 08/14/2017 0913   TSH 5.110 (H) 04/19/2017 0818    ASSESSMENT AND PLAN: Other specified hypothyroidism  Class 1 obesity with serious comorbidity and body mass index (BMI) of 31.0 to 31.9 in adult, unspecified obesity type  PLAN:  Hypothyroidism Philip [name] was informed of the importance of good thyroid control to help with weight loss efforts. He was also informed that supertheraputic thyroid levels are dangerous and will not improve weight loss results. Deshannon will continue levothyroxine and follow up as directed.  We spent > than 50% of the 15 minute visit on the counseling as documented in the note.  Obesity Philip [name] is currently in the action stage of change. As such, his goal is to continue with weight loss efforts  He has agreed to follow the Category 2 plan Philip [name] has been instructed to work up to a goal of 150 minutes of combined cardio and strengthening exercise per week for weight loss and overall health benefits. We discussed the following Behavioral Modification Strategies today: increasing lean protein intake and work on meal planning and easy cooking plans  Philip [name] has agreed to follow up  with our clinic in 2 weeks. He was informed of the importance of frequent follow up visits to maximize his success with intensive lifestyle modifications for his multiple health conditions.    OBESITY BEHAVIORAL INTERVENTION VISIT  Today's visit was # 20 out of 22.  Starting weight: 230 lbs Starting date: 01/18/17 Today's weight : 211 lbs  Today's date: 01/08/2018 Total lbs lost to date: 85 (Patients must lose 7 lbs in the first 6 months to continue with counseling)   ASK: We discussed the diagnosis of obesity with Philip [name] today and Philip [name] agreed to give [place] permission to discuss obesity behavioral modification therapy today.  ASSESS: Philip [name] has the diagnosis of obesity and his BMI today is 31.15 Philip [name] is in the action stage of change   ADVISE: Philip [name] was educated on the multiple health risks of obesity as well as the benefit of weight loss to improve his health. He was advised of the need for long term treatment and the importance of lifestyle modifications.  AGREE: Multiple dietary modification options and treatment options were discussed and  Philip [name] agreed to the above obesity treatment plan.   Philip [name], am acting as transcriptionist for [employer], PA-C I, Lacy Duverney [facility], have reviewed this note and agree with its content

## 2018-01-21 ENCOUNTER — Other Ambulatory Visit (INDEPENDENT_AMBULATORY_CARE_PROVIDER_SITE_OTHER): Payer: Self-pay | Admitting: Physician Assistant

## 2018-01-21 DIAGNOSIS — F3289 Other specified depressive episodes: Secondary | ICD-10-CM

## 2018-01-25 ENCOUNTER — Other Ambulatory Visit (INDEPENDENT_AMBULATORY_CARE_PROVIDER_SITE_OTHER): Payer: Self-pay | Admitting: Physician Assistant

## 2018-01-25 DIAGNOSIS — F3289 Other specified depressive episodes: Secondary | ICD-10-CM

## 2018-01-30 DIAGNOSIS — H25043 Posterior subcapsular polar age-related cataract, bilateral: Secondary | ICD-10-CM | POA: Diagnosis not present

## 2018-01-30 DIAGNOSIS — H2511 Age-related nuclear cataract, right eye: Secondary | ICD-10-CM | POA: Diagnosis not present

## 2018-01-30 DIAGNOSIS — H2513 Age-related nuclear cataract, bilateral: Secondary | ICD-10-CM | POA: Diagnosis not present

## 2018-01-30 DIAGNOSIS — H25013 Cortical age-related cataract, bilateral: Secondary | ICD-10-CM | POA: Diagnosis not present

## 2018-01-30 DIAGNOSIS — H40013 Open angle with borderline findings, low risk, bilateral: Secondary | ICD-10-CM | POA: Diagnosis not present

## 2018-01-31 ENCOUNTER — Ambulatory Visit (INDEPENDENT_AMBULATORY_CARE_PROVIDER_SITE_OTHER): Payer: Medicare HMO | Admitting: Family Medicine

## 2018-01-31 VITALS — BP 127/70 | HR 50 | Temp 97.9°F | Ht 69.0 in | Wt 207.0 lb

## 2018-01-31 DIAGNOSIS — E038 Other specified hypothyroidism: Secondary | ICD-10-CM | POA: Diagnosis not present

## 2018-01-31 DIAGNOSIS — Z6830 Body mass index (BMI) 30.0-30.9, adult: Secondary | ICD-10-CM | POA: Diagnosis not present

## 2018-01-31 DIAGNOSIS — E669 Obesity, unspecified: Secondary | ICD-10-CM | POA: Diagnosis not present

## 2018-01-31 MED ORDER — LEVOTHYROXINE SODIUM 25 MCG PO TABS: 25.0000 ug | ORAL_TABLET | Freq: Every day | ORAL | 0 refills | Status: DC

## 2018-01-31 NOTE — Progress Notes (Signed)
Office: [phone]  /  Fax: [phone]   HPI:   Chief Complaint: OBESITY Philip [name] is here to discuss his progress with his obesity treatment plan. He is on the  follow the Category 2 plan and is following his eating plan approximately 75 % of the time. He states he is walking for 60 minutes 7 times per week. Philip [name] continues to do well with weight loss. He is making smart choices when he eats out. Philip [name] is walking approximately 3 to 4 miles per day in addition to doing some push ups and stretches. His weight is 207 lb (93.9 kg) today and has had a weight loss of 4 pounds over a period of 3 weeks since his last visit. He has lost 23 lbs since starting treatment with [place].  Hypothyroid Philip [name] has a diagnosis of hypothyroidism. He is on levothyroxine. His energy is good and he denies hot or cold intolerance or palpitations   ALLERGIES: No Known Allergies  MEDICATIONS: Current Outpatient Medications on File Prior to Visit  Medication Sig Dispense Refill  . amLODipine (NORVASC) 5 MG tablet Take 1 tablet (5 mg total) by mouth daily. 90 tablet 3  . atorvastatin (LIPITOR) 40 MG tablet Take 1 tablet (40 mg total) by mouth at bedtime. 90 tablet 2  . B Complex-Biotin-FA (B-COMPLEX PO) Take 1 tablet by mouth daily.    Marland Kitchen buPROPion (WELLBUTRIN SR) 150 MG 12 hr tablet TAKE 1 TABLET BY MOUTH ONCE DAILY 30 tablet 0  . Cholecalciferol (VITAMIN D3) 5000 UNITS CAPS Take 5,000 Units by mouth daily.    Marland Kitchen co-enzyme Q-10 30 MG capsule Take 30 mg by mouth daily.    Marland Kitchen ELIQUIS 5 MG TABS tablet TAKE 1 TABLET TWICE DAILY 180 tablet 1  . fenofibrate (TRICOR) 48 MG tablet TAKE 1 TABLET EVERY DAY 90 tablet 2  . hydrocortisone 2.5 % cream Apply 1 application topically as needed.    Marland Kitchen lisinopril (PRINIVIL,ZESTRIL) 10 MG tablet Take 1 tablet (10 mg total) by mouth daily. 90 tablet 3  . magnesium oxide (MAG-OX) 400 MG tablet Take 800 mg by mouth daily.    . Melatonin 10 MG CAPS Take 10 mg at bedtime by mouth.    .  Multiple Vitamins-Minerals (MULTIVITAMIN WITH MINERALS) tablet Take 1 tablet by mouth daily.    . Omega-3 Fatty Acids (FISH OIL) 1200 MG CAPS Take 1,200 mg by mouth daily.    . polyethylene glycol (MIRALAX / GLYCOLAX) packet Take 17 g by mouth daily.    . metoprolol succinate (TOPROL-XL) 50 MG 24 hr tablet Take 1 tablet (50 mg total) by mouth daily. Take with or immediately following a meal. 90 tablet 1   Current Facility-Administered Medications on File Prior to Visit  Medication Dose Route Frequency Provider Last Rate Last Dose  . triamcinolone acetonide (KENALOG) 10 MG/ML injection 10 mg  10 mg Other Once Trula Slade, DPM        PAST MEDICAL HISTORY: Past Medical History:  Diagnosis Date  . AAA (abdominal aortic aneurysm) ([place])   . Atrial fibrillation ([place])   . CAD (coronary artery disease)    had a MI , s/p CABG  . Cataracts, bilateral    immature  . CHF (congestive heart failure) ([place])   . Dysrhythmia    paroximal a fib  . GERD (gastroesophageal reflux disease)    takes Omeprazole daily  . History of colon polyps   . History of kidney stones   . History of shingles   .  Hyperlipidemia   . Hypertension    takes Amlodipine,Metoprolol,and Lisinopril daily  . Joint pain   . Joint swelling   . Myocardial infarction [facility])    several with last one being 2001(but never knew about any except 2001)  . OSA (obstructive sleep apnea)    started CPAP 12/09  . Peripheral vascular disease ([place])   . Pneumonia    as a child  . Skin cancer    several , one of them was melanoma (sees derm routinely)  . Tingling    feet occasionally  . Tinnitus     PAST SURGICAL HISTORY: Past Surgical History:  Procedure Laterality Date  . ABDOMINAL AORTAGRAM N/A 06/16/2014   Procedure: ABDOMINAL Maxcine Ham;  Surgeon: Angelia Mould, MD;  [facility] CATH LAB;  Service: Cardiovascular;  Laterality: N/A;  . ABDOMINAL AORTIC ENDOVASCULAR STENT GRAFT N/A 07/08/2014   Procedure: ABDOMINAL  AORTIC ENDOVASCULAR STENT GRAFT/ GORE;  Surgeon: Angelia Mould, MD;  Location: [place];  Service: Vascular;  Laterality: N/A;  . CARDIAC CATHETERIZATION  2015  . COLONOSCOPY    . CORONARY ARTERY BYPASS GRAFT  1999   x 3 vessels  . LEFT HEART CATHETERIZATION WITH CORONARY/GRAFT ANGIOGRAM N/A 05/29/2014   Procedure: LEFT HEART CATHETERIZATION WITH [name];  Surgeon: [name], MD;  Location: [facility] CATH LAB;  Service: Cardiovascular;  Laterality: N/A;  . PENILE PROSTHESIS IMPLANT  08/2005  . RHINOPLASTY  1975    SOCIAL HISTORY: Social History   Tobacco Use  . Smoking status: Former Smoker    Types: Cigarettes    Last attempt to quit: 1979    Years since quitting: 40.2  . Smokeless tobacco: Never Used  . Tobacco comment: quit smoking in 1979  Substance Use Topics  . Alcohol use: Yes    Comment: occasionally - once per week  . Drug use: No    FAMILY HISTORY: Family History  Problem Relation Age of Onset  . Prostate cancer Father 57  . Heart disease Father   . Skin cancer Father   . Heart attack Father   . Cancer Father   . Deep vein thrombosis Father   . Hyperlipidemia Father   . Hypertension Father   . Heart disease Mother   . Skin cancer Mother   . Heart attack Mother   . Cancer Mother   . Hyperlipidemia Mother   . Hypertension Mother   . Varicose Veins Mother   . Peripheral vascular disease Mother        amputation  . Sleep apnea Brother   . Hyperlipidemia Brother   . Hypertension Brother   . Prostate cancer Brother 90  . Colon cancer Other        aunt  . Skin cancer Brother   . Skin cancer Sister   . Cancer Sister   . Heart disease Sister   . Diabetes Neg Hx   . Stroke Neg Hx     ROS: Review of Systems  Constitutional: Positive for weight loss. Negative for malaise/fatigue.  Cardiovascular: Negative for palpitations.  Endo/Heme/Allergies:       Negative for hot or cold intolerance    PHYSICAL EXAM: Blood pressure 127/70,  pulse (!) 50, temperature 97.9 F (36.6 C), temperature source Oral, height 5\' 9"  (1.753 m), weight 207 lb (93.9 kg), SpO2 99 %. Body mass index is 30.57 kg/m. Physical Exam  Constitutional: He is oriented to person, place, and time. He appears well-developed and well-nourished.  Cardiovascular: Normal rate.  Pulmonary/Chest:  Effort normal.  Musculoskeletal: Normal range of motion.  Neurological: He is oriented to person, place, and time.  Skin: Skin is warm and dry.  Psychiatric: He has a normal mood and affect. His behavior is normal.  Vitals reviewed.   RECENT LABS AND TESTS: BMET    Component Value Date/Time   NA 140 01/01/2018 1017   NA 144 08/14/2017 0913   K 5.1 01/01/2018 1017   CL 103 01/01/2018 1017   CO2 31 01/01/2018 1017   GLUCOSE 99 01/01/2018 1017   BUN 21 01/01/2018 1017   BUN 17 08/14/2017 0913   CREATININE 1.18 01/01/2018 1017   CREATININE 1.08 12/14/2015 1122   CALCIUM 10.1 01/01/2018 1017   GFRNONAA 68 08/14/2017 0913   GFRAA 79 08/14/2017 0913   Lab Results  Component Value Date   HGBA1C 5.6 08/14/2017   HGBA1C 5.7 (H) 04/19/2017   HGBA1C 5.9 (H) 01/18/2017   HGBA1C 6.1 12/07/2016   Lab Results  Component Value Date   INSULIN 10.8 08/14/2017   INSULIN 17.3 04/19/2017   INSULIN 16.1 01/18/2017   CBC    Component Value Date/Time   WBC 4.6 01/01/2018 1017   RBC 4.77 01/01/2018 1017   HGB 15.2 01/01/2018 1017   HGB 15.0 01/18/2017 0938   HCT 44.6 01/01/2018 1017   HCT 43.9 01/18/2017 0938   PLT 153.0 01/01/2018 1017   PLT 156 04/07/2010   MCV 93.6 01/01/2018 1017   MCV 92 01/18/2017 0938   MCH 31.5 01/18/2017 0938   MCH 31.1 12/14/2015 1122   MCHC 34.1 01/01/2018 1017   RDW 12.9 01/01/2018 1017   RDW 13.1 01/18/2017 0938   LYMPHSABS 1.6 01/01/2018 1017   LYMPHSABS 2.0 01/18/2017 0938   MONOABS 0.4 01/01/2018 1017   EOSABS 0.1 01/01/2018 1017   EOSABS 0.1 01/18/2017 0938   BASOSABS 0.0 01/01/2018 1017   BASOSABS 0.0 01/18/2017 0938    Iron/TIBC/Ferritin/ %Sat No results found for: IRON, TIBC, FERRITIN, IRONPCTSAT Lipid Panel     Component Value Date/Time   CHOL 113 08/14/2017 0913   TRIG 121 08/14/2017 0913   TRIG 256 09/14/2009 0000   HDL 42 08/14/2017 0913   CHOLHDL 3 12/07/2016 0852   VLDL 30.8 12/07/2016 0852   LDLCALC 47 08/14/2017 0913   LDLDIRECT 68.0 04/13/2015 0823   Hepatic Function Panel     Component Value Date/Time   PROT 6.6 08/14/2017 0913   ALBUMIN 4.6 08/14/2017 0913   AST 22 08/14/2017 0913   ALT 19 08/14/2017 0913   ALKPHOS 61 08/14/2017 0913   BILITOT 0.6 08/14/2017 0913      Component Value Date/Time   TSH 2.430 10/19/2017 0906   TSH 4.880 (H) 08/14/2017 0913   TSH 5.110 (H) 04/19/2017 0818   Results for ZEFERINO, MOUNTS (MRN [id number]) as of 01/31/2018 13:15  Ref. Range 08/14/2017 09:13  Vitamin D, 25-Hydroxy Latest Ref Range: 30.0 - 100.0 ng/mL 52.7   ASSESSMENT AND PLAN: Other specified hypothyroidism - Plan: levothyroxine (SYNTHROID, LEVOTHROID) 25 MCG tablet, T3, T4, free, TSH  Class 1 obesity with serious comorbidity and body mass index (BMI) of 30.0 to 30.9 in adult, unspecified obesity type  PLAN:  Hypothyroid Philip [name] was informed of the importance of good thyroid control to help with weight loss efforts. He was also informed that supertheraputic thyroid levels are dangerous and will not improve weight loss results. We will check thyroid labs and Philip [name] agrees to continue levothyroxine 25 mcg qd #30 with no refills. Philip [name] agreed to  follow up at the agreed upon time.  Obesity  Philip [name] is currently in the action stage of change. As such, his goal is to continue with weight loss efforts He has agreed to follow the Category 2 plan Philip [name] has been instructed to work up to a goal of 150 minutes of combined cardio and strengthening exercise per week for weight loss and overall health benefits. We discussed the following Behavioral Modification Strategies today: no skipping  meals, increasing lean protein intake and work on meal planning and easy cooking plans  Philip [name] has agreed to follow up with our clinic in 3 to 4 weeks. He was informed of the importance of frequent follow up visits to maximize his success with intensive lifestyle modifications for his multiple health conditions.   OBESITY BEHAVIORAL INTERVENTION VISIT  Today's visit was # 21 out of 22.  Starting weight: 230 lbs Starting date: 01/18/17 Today's weight : 207 lbs Today's date: 01/31/2018 Total lbs lost to date: 23 (Patients must lose 7 lbs in the first 6 months to continue with counseling)   ASK: We discussed the diagnosis of obesity with Philip [name] today and Philip [name] agreed to give [place] permission to discuss obesity behavioral modification therapy today.  ASSESS: Philip [name] has the diagnosis of obesity and his BMI today is 30.55 N[place] is in the action stage of change   ADVISE: Philip [name] was educated on the multiple health risks of obesity as well as the benefit of weight loss to improve his health. He was advised of the need for long term treatment and the importance of lifestyle modifications.  AGREE: Multiple dietary modification options and treatment options were discussed and  Azael agreed to the above obesity treatment plan.  I, Doreene Nest, am acting as transcriptionist for [name], MD  I have reviewed the above documentation for accuracy and completeness, and I agree with the above. -[name], MD

## 2018-02-01 LAB — T3: T3 TOTAL: 84 ng/dL (ref 71–180)

## 2018-02-01 LAB — T4, FREE: Free T4: 1.12 ng/dL (ref 0.82–1.77)

## 2018-02-01 LAB — TSH: TSH: 4.01 u[IU]/mL (ref 0.450–4.500)

## 2018-02-05 ENCOUNTER — Other Ambulatory Visit: Payer: Self-pay | Admitting: Internal Medicine

## 2018-02-05 DIAGNOSIS — I2510 Atherosclerotic heart disease of native coronary artery without angina pectoris: Secondary | ICD-10-CM

## 2018-02-05 NOTE — Telephone Encounter (Signed)
Please review for refill, Thanks !  

## 2018-02-06 ENCOUNTER — Telehealth: Payer: Self-pay

## 2018-02-06 NOTE — Telephone Encounter (Signed)
   [facility] Pre-operative Risk Assessment    Request for surgical clearance:  1. What type of surgery is being performed? Cataract extraction w/intraocular lens implantation of the right and left eye   2. When is this surgery scheduled? 03/12/18  03/26/18  3. What type of clearance is required (medical clearance vs. Pharmacy clearance to hold med vs. Both)? both  4. Are there any medications that need to be held prior to surgery and how long?unknown   5. Practice name and name of physician performing surgery? [facility]  Dr Christia Reading Bevis   6. What is your office phone and fax number? [phone]  [phone]   7. Anesthesia type (None, local, MAC, general) ? local   Philip [name] 02/06/2018, 11:36 AM  _________________________________________________________________   (provider comments below)

## 2018-02-07 NOTE — Telephone Encounter (Signed)
Generally recommend against holding anticoagulation for cataract procedures.  

## 2018-02-08 NOTE — Telephone Encounter (Signed)
   Primary Cardiologist: Nelva Bush, MD  Chart reviewed as part of pre-operative protocol coverage. Given past medical history and time since last visit, based on ACC/AHA guidelines, Philip [name] would be at acceptable risk for the planned procedure without further cardiovascular testing.   We do not recommend holding anticoagulation therapy for cataract surgery.  I will route this recommendation to the requesting party via Epic fax function and remove from pre-op pool.  Please call with questions.  Tami Lin Duke, PA 02/08/2018, 4:59 PM

## 2018-02-15 DIAGNOSIS — G4733 Obstructive sleep apnea (adult) (pediatric): Secondary | ICD-10-CM | POA: Diagnosis not present

## 2018-02-21 ENCOUNTER — Ambulatory Visit (INDEPENDENT_AMBULATORY_CARE_PROVIDER_SITE_OTHER): Payer: Medicare HMO | Admitting: Family Medicine

## 2018-02-21 VITALS — BP 135/69 | HR 60 | Temp 97.7°F | Ht 69.0 in | Wt 206.0 lb

## 2018-02-21 DIAGNOSIS — E038 Other specified hypothyroidism: Secondary | ICD-10-CM

## 2018-02-21 DIAGNOSIS — F3289 Other specified depressive episodes: Secondary | ICD-10-CM

## 2018-02-21 DIAGNOSIS — Z6830 Body mass index (BMI) 30.0-30.9, adult: Secondary | ICD-10-CM | POA: Diagnosis not present

## 2018-02-21 DIAGNOSIS — E039 Hypothyroidism, unspecified: Secondary | ICD-10-CM

## 2018-02-21 DIAGNOSIS — E669 Obesity, unspecified: Secondary | ICD-10-CM

## 2018-02-21 MED ORDER — BUPROPION HCL ER (SR) 150 MG PO TB12: 150.0000 mg | ORAL_TABLET | Freq: Every day | ORAL | 0 refills | Status: DC

## 2018-02-21 MED ORDER — LEVOTHYROXINE SODIUM 25 MCG PO TABS: 25.0000 ug | ORAL_TABLET | Freq: Every day | ORAL | 0 refills | Status: DC

## 2018-02-26 NOTE — Progress Notes (Signed)
Office: [phone]  /  Fax: [phone]   HPI:   Chief Complaint: OBESITY Philip [name] is here to discuss his progress with his obesity treatment plan. He is on the Category 2 plan and is following his eating plan approximately 75 % of the time. He states he is walking for 60 minutes 7 times per week. Philip [name] continues to do well with weight loss, he notes his energy has improved with weight loss and increase activity. He states his hunger is controlled overall on Category 2 plan.  His weight is 206 lb (93.4 kg) today and has had a weight loss of 1 pound over a period of 3 weeks since his last visit. He has lost 24 lbs since starting treatment with [place].  Hypothyroid Carden has a diagnosis of hypothyroidism. He is on levothyroxine. His thyroid levels are stable and he denies hot or cold intolerance or palpitations and he notes fatigue improved.  Depression with emotional eating behaviors Philip [name]'s mood is stable on Wellbutrin, decreased emotional eating and sleeping better. His blood pressure is stable. Philip [name] struggles with emotional eating and using food for comfort to the extent that it is negatively impacting his health. He often snacks when he is not hungry. Philip [name] sometimes feels he is out of control and then feels guilty that he made poor food choices. He has been working on behavior modification techniques to help reduce his emotional eating and has been somewhat successful. He shows no sign of suicidal or homicidal ideations.  Depression screen [facility] 2/9 01/18/2017 12/06/2016 12/21/2015 06/19/2015 04/22/2014  Decreased Interest 3 1 0 0 0  Down, Depressed, Hopeless 1 0 0 0 0  PHQ - 2 Score 4 1 0 0 0  Altered sleeping 2 - - - -  Tired, decreased energy 2 - - - -  Change in appetite 1 - - - -  Feeling bad or failure about yourself  2 - - - -  Trouble concentrating 1 - - - -  Moving slowly or fidgety/restless 1 - - - -  Suicidal thoughts 0 - - - -  PHQ-9 Score 13 - - - -    ALLERGIES: No  Known Allergies  MEDICATIONS: Current Outpatient Medications on File Prior to Visit  Medication Sig Dispense Refill  . amLODipine (NORVASC) 5 MG tablet Take 1 tablet (5 mg total) by mouth daily. 90 tablet 3  . atorvastatin (LIPITOR) 40 MG tablet Take 1 tablet (40 mg total) by mouth at bedtime. 90 tablet 2  . B Complex-Biotin-FA (B-COMPLEX PO) Take 1 tablet by mouth daily.    . Cholecalciferol (VITAMIN D3) 5000 UNITS CAPS Take 5,000 Units by mouth daily.    Philip [name] co-enzyme Q-10 30 MG capsule Take 30 mg by mouth daily.    Philip [name] ELIQUIS 5 MG TABS tablet TAKE 1 TABLET TWICE DAILY 180 tablet 1  . fenofibrate (TRICOR) 48 MG tablet TAKE 1 TABLET EVERY DAY 90 tablet 2  . hydrocortisone 2.5 % cream Apply 1 application topically as needed.    Philip [name] lisinopril (PRINIVIL,ZESTRIL) 10 MG tablet Take 1 tablet (10 mg total) by mouth daily. 90 tablet 3  . magnesium oxide (MAG-OX) 400 MG tablet Take 800 mg by mouth daily.    . Melatonin 10 MG CAPS Take 10 mg at bedtime by mouth.    . metoprolol succinate (TOPROL-XL) 50 MG 24 hr tablet Take 1 tablet (50 mg total) by mouth daily. Take with or immediately following a meal. 90 tablet 1  . Multiple Vitamins-Minerals (  MULTIVITAMIN WITH MINERALS) tablet Take 1 tablet by mouth daily.    . Omega-3 Fatty Acids (FISH OIL) 1200 MG CAPS Take 1,200 mg by mouth daily.    . polyethylene glycol (MIRALAX / GLYCOLAX) packet Take 17 g by mouth daily.     Current Facility-Administered Medications on File Prior to Visit  Medication Dose Route Frequency Provider Last Rate Last Dose  . triamcinolone acetonide (KENALOG) 10 MG/ML injection 10 mg  10 mg Other Once Philip [name], DPM        PAST MEDICAL HISTORY: Past Medical History:  Diagnosis Date  . AAA (abdominal aortic aneurysm) ([place])   . Atrial fibrillation ([place])   . CAD (coronary artery disease)    had a MI , s/p CABG  . Cataracts, bilateral    immature  . CHF (congestive heart failure) ([place])   . Dysrhythmia    paroximal a  fib  . GERD (gastroesophageal reflux disease)    takes Omeprazole daily  . History of colon polyps   . History of kidney stones   . History of shingles   . Hyperlipidemia   . Hypertension    takes Amlodipine,Metoprolol,and Lisinopril daily  . Joint pain   . Joint swelling   . Myocardial infarction [facility])    several with last one being 2001(but never knew about any except 2001)  . OSA (obstructive sleep apnea)    started CPAP 12/09  . Peripheral vascular disease ([place])   . Pneumonia    as a child  . Skin cancer    several , one of them was melanoma (sees derm routinely)  . Tingling    feet occasionally  . Tinnitus     PAST SURGICAL HISTORY: Past Surgical History:  Procedure Laterality Date  . ABDOMINAL AORTAGRAM N/A 06/16/2014   Procedure: ABDOMINAL Maxcine Ham;  Surgeon: Philip Mould, MD;  Location: [facility] CATH LAB;  Service: Cardiovascular;  Laterality: N/A;  . ABDOMINAL AORTIC ENDOVASCULAR STENT GRAFT N/A 07/08/2014   Procedure: ABDOMINAL AORTIC ENDOVASCULAR STENT GRAFT/ GORE;  Surgeon: Philip Mould, MD;  Location: [place];  Service: Vascular;  Laterality: N/A;  . CARDIAC CATHETERIZATION  2015  . COLONOSCOPY    . CORONARY ARTERY BYPASS GRAFT  1999   x 3 vessels  . LEFT HEART CATHETERIZATION WITH CORONARY/GRAFT ANGIOGRAM N/A 05/29/2014   Procedure: LEFT HEART CATHETERIZATION WITH Philip [name];  Surgeon: Philip Dresser, MD;  Location[facility] C[facility] LAB;  Service: Cardiovascular;  Laterality: N/A;  . PENILE PROSTHESIS IMPLANT  08/2005  . RHINOPLASTY  1975    SOCIAL HISTORY: Social History   Tobacco Use  . Smoking status: Former Smoker    Types: Cigarettes    Last attempt to quit: 1979    Years since quitting: 40.3  . Smokeless tobacco: Never Used  . Tobacco comment: quit smoking in 1979  Substance Use Topics  . Alcohol use: Yes    Comment: occasionally - once per week  . Drug use: No    FAMILY HISTORY: Family History  Problem Relation Age of Onset   . Prostate cancer Father 21  . Heart disease Father   . Skin cancer Father   . Heart attack Father   . Cancer Father   . Deep vein thrombosis Father   . Hyperlipidemia Father   . Hypertension Father   . Heart disease Mother   . Skin cancer Mother   . Heart attack Mother   . Cancer Mother   . Hyperlipidemia Mother   .  Hypertension Mother   . Varicose Veins Mother   . Peripheral vascular disease Mother        amputation  . Sleep apnea Brother   . Hyperlipidemia Brother   . Hypertension Brother   . Prostate cancer Brother 87  . Colon cancer Other        aunt  . Skin cancer Brother   . Skin cancer Sister   . Cancer Sister   . Heart disease Sister   . Diabetes Neg Hx   . Stroke Neg Hx     ROS: Review of Systems  Constitutional: Positive for malaise/fatigue and weight loss.  Cardiovascular: Negative for palpitations.  Endo/Heme/Allergies:       Negative hot/cold intolerance  Psychiatric/Behavioral: Positive for depression. Negative for suicidal ideas.    PHYSICAL EXAM: Blood pressure 135/69, pulse 60, temperature 97.7 F (36.5 C), height 5\' 9"  (1.753 m), weight 206 lb (93.4 kg), SpO2 99 %. Body mass index is 30.42 kg/m. Physical Exam  Constitutional: He is oriented to person, place, and time. He appears well-developed and well-nourished.  Cardiovascular: Normal rate.  Pulmonary/Chest: Effort normal.  Musculoskeletal: Normal range of motion.  Neurological: He is oriented to person, place, and time.  Skin: Skin is warm and dry.  Psychiatric: He has a normal mood and affect. His behavior is normal.  Vitals reviewed.   RECENT LABS AND TESTS: BMET    Component Value Date/Time   NA 140 01/01/2018 1017   NA 144 08/14/2017 0913   K 5.1 01/01/2018 1017   CL 103 01/01/2018 1017   CO2 31 01/01/2018 1017   GLUCOSE 99 01/01/2018 1017   BUN 21 01/01/2018 1017   BUN 17 08/14/2017 0913   CREATININE 1.18 01/01/2018 1017   CREATININE 1.08 12/14/2015 1122   CALCIUM 10.1  01/01/2018 1017   GFRNONAA 68 08/14/2017 0913   GFRAA 79 08/14/2017 0913   Lab Results  Component Value Date   HGBA1C 5.6 08/14/2017   HGBA1C 5.7 (H) 04/19/2017   HGBA1C 5.9 (H) 01/18/2017   HGBA1C 6.1 12/07/2016   Lab Results  Component Value Date   INSULIN 10.8 08/14/2017   INSULIN 17.3 04/19/2017   INSULIN 16.1 01/18/2017   CBC    Component Value Date/Time   WBC 4.6 01/01/2018 1017   RBC 4.77 01/01/2018 1017   HGB 15.2 01/01/2018 1017   HGB 15.0 01/18/2017 0938   HCT 44.6 01/01/2018 1017   HCT 43.9 01/18/2017 0938   PLT 153.0 01/01/2018 1017   PLT 156 04/07/2010   MCV 93.6 01/01/2018 1017   MCV 92 01/18/2017 0938   MCH 31.5 01/18/2017 0938   MCH 31.1 12/14/2015 1122   MCHC 34.1 01/01/2018 1017   RDW 12.9 01/01/2018 1017   RDW 13.1 01/18/2017 0938   LYMPHSABS 1.6 01/01/2018 1017   LYMPHSABS 2.0 01/18/2017 0938   MONOABS 0.4 01/01/2018 1017   EOSABS 0.1 01/01/2018 1017   EOSABS 0.1 01/18/2017 0938   BASOSABS 0.0 01/01/2018 1017   BASOSABS 0.0 01/18/2017 0938   Iron/TIBC/Ferritin/ %Sat No results found for: IRON, TIBC, FERRITIN, IRONPCTSAT Lipid Panel     Component Value Date/Time   CHOL 113 08/14/2017 0913   TRIG 121 08/14/2017 0913   TRIG 256 09/14/2009 0000   HDL 42 08/14/2017 0913   CHOLHDL 3 12/07/2016 0852   VLDL 30.8 12/07/2016 0852   LDLCALC 47 08/14/2017 0913   LDLDIRECT 68.0 04/13/2015 0823   Hepatic Function Panel     Component Value Date/Time   PROT  6.6 08/14/2017 0913   ALBUMIN 4.6 08/14/2017 0913   AST 22 08/14/2017 0913   ALT 19 08/14/2017 0913   ALKPHOS 61 08/14/2017 0913   BILITOT 0.6 08/14/2017 0913      Component Value Date/Time   TSH 4.010 01/31/2018 1158   TSH 2.430 10/19/2017 0906   TSH 4.880 (H) 08/14/2017 0913    ASSESSMENT AND PLAN: Subclinical hypothyroidism - Plan: levothyroxine (SYNTHROID, LEVOTHROID) 25 MCG tablet  Other depression - with emotional eating  - Plan: buPROPion (WELLBUTRIN SR) 150 MG 12 hr  tablet  Class 1 obesity with serious comorbidity and body mass index (BMI) of 30.0 to 30.9 in adult, unspecified obesity type  PLAN:  Hypothyroid Auron was informed of the importance of good thyroid control to help with weight loss efforts. He was also informed that supertheraputic thyroid levels are dangerous and will not improve weight loss results. Taeveon agrees to continue taking levothyroxine 25 mcg q AM #30 and we will refill for 1 month. Erle agrees to follow up with our clinic in 3 to 4 weeks.  Depression with Emotional Eating Behaviors We discussed behavior modification techniques today to help Henryk deal with his emotional eating and depression. Dekari has agrees to continue taking Wellbutrin SR 150 mg qd #30 and we will refill for 1 month. Tallie agrees to follow up with our clinic in 3 to 4 weeks.  Obesity Taye is currently in the action stage of change. As such, his goal is to continue with weight loss efforts He has agreed to follow the Category 2 plan Kieran has been instructed to work up to a goal of 150 minutes of combined cardio and strengthening exercise per week for weight loss and overall health benefits. We discussed the following Behavioral Modification Strategies today: increasing lean protein intake, decreasing simple carbohydrates , work on meal planning and easy cooking plans and emotional eating strategies   Domingo has agreed to follow up with our clinic in 3 to 4 weeks. He was informed of the importance of frequent follow up visits to maximize his success with intensive lifestyle modifications for his multiple health conditions.   OBESITY BEHAVIORAL INTERVENTION VISIT  Today's visit was # 22 out of 22.  Starting weight: 230 lbs Starting date: 01/18/17 Today's weight : 206 lbs  Today's date: 02/21/2018 Total lbs lost to date: 24 (Patients must lose 7 lbs in the first 6 months to continue with counseling)   ASK: We discussed the diagnosis of obesity  with [name] today and Ziare agreed to give [place] permission to discuss obesity behavioral modification therapy today.  ASSESS: Jay has the diagnosis of obesity and his BMI today is 30.41 Peyson is in the action stage of change   ADVISE: Digby was educated on the multiple health risks of obesity as well as the benefit of weight loss to improve his health. He was advised of the need for long term treatment and the importance of lifestyle modifications.  AGREE: Multiple dietary modification options and treatment options were discussed and  Zakariyya agreed to the above obesity treatment plan.  I, Trixie Dredge, am acting as transcriptionist for [name], MD  I have reviewed the above documentation for accuracy and completeness, and I agree with the above. -[name], MD

## 2018-03-07 ENCOUNTER — Other Ambulatory Visit (HOSPITAL_COMMUNITY): Payer: Medicare HMO

## 2018-03-07 ENCOUNTER — Ambulatory Visit: Payer: Medicare HMO | Admitting: Family

## 2018-03-09 ENCOUNTER — Ambulatory Visit: Payer: Medicare HMO | Admitting: Family

## 2018-03-09 ENCOUNTER — Telehealth: Payer: Self-pay

## 2018-03-09 ENCOUNTER — Other Ambulatory Visit: Payer: Self-pay

## 2018-03-09 ENCOUNTER — Encounter: Payer: Self-pay | Admitting: Family

## 2018-03-09 ENCOUNTER — Ambulatory Visit (HOSPITAL_COMMUNITY)
Admission: RE | Admit: 2018-03-09 | Discharge: 2018-03-09 | Disposition: A | Discharge status: Home / Self Care | Payer: Medicare HMO | Source: Ambulatory Visit | Attending: Vascular Surgery | Admitting: Vascular Surgery

## 2018-03-09 VITALS — BP 127/72 | HR 57 | Resp 20 | Ht 69.0 in | Wt 211.0 lb

## 2018-03-09 DIAGNOSIS — I714 Abdominal aortic aneurysm, without rupture, unspecified: Secondary | ICD-10-CM

## 2018-03-09 DIAGNOSIS — Z95828 Presence of other vascular implants and grafts: Secondary | ICD-10-CM

## 2018-03-09 NOTE — Progress Notes (Signed)
[facility] [place]  CC: Follow up s/p Endovascular Repair of Abdominal Aortic Aneurysm    History of Present Illness  Philip [name] is a 77 y.o. 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 Scot [name].  Duplex scan on 02-24-16 showed that the maximum diameter of the aneurysm was 6.7 cm and thus was stable in size. He has a small hypogastric artery aneurysms and therefore Dr. Toy Cookey arranged for a CT scan this visit with the plan to alternate between ultrasound and CT scans.  Dr. Scot [name] last evaluated pt on 03-01-17. At that time CT scan showed that the graft was in good position with no evidence of endoleak. The aneurysm was stable in size (6.7 cm). The small aneurysm of the left hypogastric artery was unchanged in size (2.2 cm). 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 He has bilateral adrenal adenomas which were also stable in size. His follow up CT scan showed no problems with his endograft. The aneurysm was stable in size at 6.7 cm. The right hypogastric artery measured 1.35 cm in maximum diameter. The left hypogastric artery measured 2.2 cm in maximum diameter. These were both stable in size. Dr. Scot [name] will continue with the plan to alternate CT scan with ultrasound. Therefore will see him back in 1 year with a duplex. Fortunately, he is not a smoker. He will discuss the need for Eliquis with his cardiologist Dr. Saunders Revel. From Dr. Scot [name] standpoint he does not need to be on this and would prefer to have him on aspirin.  Pt denies any abdominal pain or back pain.  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   He is trying to lose some weight and is working with [employer] program. He has lost some weight and overall is doing very  well.  He continues to be on Eliquis as he had some arrhythmias in the past but is not sure if he still needs to be on this.  Pt was to discuss this with his cardiologist Dr. Saunders Revel, as they were the ones who put him on this prior to his endovascular aneurysm repair. He is not taking aspirin because he is on the Eliquis. He is on a statin.   Diabetic: No Tobaccos use: former smoker, quit in 1979   Past Medical History:  Diagnosis Date  . AAA (abdominal aortic aneurysm) ([place])   . Atrial fibrillation ([place])   . CAD (coronary artery disease)    had a MI , s/p CABG  . Cataracts, bilateral    immature  . CHF (congestive heart failure) ([employer])   . Dysrhythmia    paroximal a fib  . GERD (gastroesophageal reflux disease)    takes Omeprazole daily  . History of colon polyps   . History of kidney stones   . History of shingles   . Hyperlipidemia   . Hypertension    takes Amlodipine,Metoprolol,and Lisinopril daily  . Joint pain   . Joint swelling   . Myocardial infarction [facility])    several with last one being 2001(but never knew about any except 2001)  . OSA (obstructive sleep apnea)    started CPAP 12/09  . Peripheral vascular disease ([place])   . Pneumonia  as a child  . Skin cancer    several , one of them was melanoma (sees derm routinely)  . Tingling    feet occasionally  . Tinnitus    Past Surgical History:  Procedure Laterality Date  . ABDOMINAL AORTAGRAM N/A 06/16/2014   Procedure: ABDOMINAL Maxcine Ham;  Surgeon: Angelia Mould, MD;  Location: [facility] CATH LAB;  Service: Cardiovascular;  Laterality: N/A;  . ABDOMINAL AORTIC ENDOVASCULAR STENT GRAFT N/A 07/08/2014   Procedure: ABDOMINAL AORTIC ENDOVASCULAR STENT GRAFT/ GORE;  Surgeon: Angelia Mould, MD;  Location: [place];  Service: Vascular;  Laterality: N/A;  . CARDIAC CATHETERIZATION  2015  . COLONOSCOPY    . CORONARY ARTERY BYPASS GRAFT  1999   x 3 vessels  . LEFT HEART CATHETERIZATION WITH CORONARY/GRAFT ANGIOGRAM  N/A 05/29/2014   Procedure: LEFT HEART CATHETERIZATION WITH [name];  Surgeon: [name], MD;  Location: [facility] CATH LAB;  Service: Cardiovascular;  Laterality: N/A;  . PENILE PROSTHESIS IMPLANT  08/2005  . RHINOPLASTY  1975   Social History Social History   Tobacco Use  . Smoking status: Former Smoker    Types: Cigarettes    Last attempt to quit: 1979    Years since quitting: 40.3  . Smokeless tobacco: Never Used  . Tobacco comment: quit smoking in 1979  Substance Use Topics  . Alcohol use: Yes    Comment: occasionally - once per week  . Drug use: No   Family History Family History  Problem Relation Age of Onset  . Prostate cancer Father 65  . Heart disease Father   . Skin cancer Father   . Heart attack Father   . Cancer Father   . Deep vein thrombosis Father   . Hyperlipidemia Father   . Hypertension Father   . Heart disease Mother   . Skin cancer Mother   . Heart attack Mother   . Cancer Mother   . Hyperlipidemia Mother   . Hypertension Mother   . Varicose Veins Mother   . Peripheral vascular disease Mother        amputation  . Sleep apnea Brother   . Hyperlipidemia Brother   . Hypertension Brother   . Prostate cancer Brother 59  . Colon cancer Other        aunt  . Skin cancer Brother   . Skin cancer Sister   . Cancer Sister   . Heart disease Sister   . Diabetes Neg Hx   . Stroke Neg Hx    Current Outpatient Medications on File Prior to Visit  Medication Sig Dispense Refill  . atorvastatin (LIPITOR) 40 MG tablet Take 1 tablet (40 mg total) by mouth at bedtime. 90 tablet 2  . B Complex-Biotin-FA (B-COMPLEX PO) Take 1 tablet by mouth daily.    Marland Kitchen buPROPion (WELLBUTRIN SR) 150 MG 12 hr tablet Take 1 tablet (150 mg total) by mouth daily. 30 tablet 0  . Cholecalciferol (VITAMIN D3) 5000 UNITS CAPS Take 5,000 Units by mouth daily.    Marland Kitchen co-enzyme Q-10 30 MG capsule Take 30 mg by mouth daily.    Marland Kitchen ELIQUIS 5 MG TABS tablet TAKE 1 TABLET TWICE  DAILY 180 tablet 1  . fenofibrate (TRICOR) 48 MG tablet TAKE 1 TABLET EVERY DAY 90 tablet 2  . hydrocortisone 2.5 % cream Apply 1 application topically as needed.    Marland Kitchen levothyroxine (SYNTHROID, LEVOTHROID) 25 MCG tablet Take 1 tablet (25 mcg total) by mouth daily before breakfast. 30 tablet 0  .  lisinopril (PRINIVIL,ZESTRIL) 10 MG tablet Take 1 tablet (10 mg total) by mouth daily. 90 tablet 3  . magnesium oxide (MAG-OX) 400 MG tablet Take 800 mg by mouth daily.    . Melatonin 10 MG CAPS Take 10 mg at bedtime by mouth.    . Multiple Vitamins-Minerals (MULTIVITAMIN WITH MINERALS) tablet Take 1 tablet by mouth daily.    . Omega-3 Fatty Acids (FISH OIL) 1200 MG CAPS Take 1,200 mg by mouth daily.    . polyethylene glycol (MIRALAX / GLYCOLAX) packet Take 17 g by mouth daily.    Marland Kitchen amLODipine (NORVASC) 5 MG tablet Take 1 tablet (5 mg total) by mouth daily. 90 tablet 3  . metoprolol succinate (TOPROL-XL) 50 MG 24 hr tablet Take 1 tablet (50 mg total) by mouth daily. Take with or immediately following a meal. 90 tablet 1   Current Facility-Administered Medications on File Prior to Visit  Medication Dose Route Frequency Provider Last Rate Last Dose  . triamcinolone acetonide (KENALOG) 10 MG/ML injection 10 mg  10 mg Other Once Trula Slade, DPM       No Known Allergies   ROS: See HPI for pertinent positives and negatives.  Physical Examination  Vitals:   03/09/18 0920  BP: 127/72  Pulse: (!) 57  Resp: 20  SpO2: 98%  Weight: 211 lb (95.7 kg)  Height: 5\' 9"  (1.753 m)   Body mass index is 31.16 kg/m.  General: A&O x 3, WD, fit appearing male HEENT: No gross abnormalities  Pulmonary: Sym exp, respirations are non labored, good air movt, CTAB, no rales, rhonchi, or wheezing  Cardiac: Regular rhythm and rate, no murmur appreciated  Vascular: Vessel Right Left  Radial 2+Palpable 2+Palpable  Carotid  without bruit  without bruit  Aorta Not palpable N/A  Femoral 2+Palpable 2+Palpable   Popliteal Not palpable Not palpable  PT 2+Palpable 2+Palpable  DP 1+Palpable 1+Palpable   Gastrointestinal: soft, NTND, -G/R, - HSM, - palpable masses, - CVAT B. Musculoskeletal: M/S 5/5 throughout, extremities without ischemic changes. Skin: No rashes, no ulcers, no cellulitis.   Neurologic: Pain and light touch intact in extremities, Motor exam as listed above. CN2-12 intact.  Psychiatric: Normal thought content, mood appropriate for clinical situation.     DATA  EVAR Duplex   Previous (Date: 02-22-17) AAA sac size: 6.7 cm, CT.  Current (Date: 03-09-18)  AAA sac size: 6.7 cm; Right CIA: 1.4 cm; Left CIA: 1.4 cm  no endoleak detected   Medical Decision Making  Philip [name] is a 77 y.o. male who presents s/p EVAR (Date: 07/08/2014).  Pt is asymptomatic with stable sac size at 6.7 cm. Before repair AAA was 7.0 cm.  I discussed with the patient the importance of surveillance of the endograft.   The next CTA will be scheduled for 12 months.  The patient will follow up with [place] in 12 months with these studies, see me afterward.  Dr. Scot [name] has indicated he wants to continue with the plan to alternate CT scan with ultrasound yearly.   I emphasized the importance of maximal medical management including strict control of blood pressure, blood glucose, and lipid levels, antiplatelet agents, obtaining regular exercise, and cessation of smoking.   Thank you for allowing [place] to participate in this patient's care.  Clemon Chambers, RN, MSN, FNP-C [facility] [place] Office: [phone]  Clinic Physician: Donzetta Matters  03/09/2018, 10:14 AM

## 2018-03-09 NOTE — Patient Instructions (Signed)
Before your next abdominal ultrasound:  Take two Extra-Strength Gas-X capsules at bedtime the night before the test. Take another two Extra-Strength Gas-X capsules 3 hours before the test.  Avoid gas forming foods and beverages the day before the test.     

## 2018-03-14 ENCOUNTER — Ambulatory Visit (INDEPENDENT_AMBULATORY_CARE_PROVIDER_SITE_OTHER): Payer: Medicare HMO | Admitting: Family Medicine

## 2018-03-14 VITALS — BP 131/72 | HR 51 | Temp 97.6°F | Ht 69.0 in | Wt 206.0 lb

## 2018-03-14 DIAGNOSIS — E669 Obesity, unspecified: Secondary | ICD-10-CM

## 2018-03-14 DIAGNOSIS — E038 Other specified hypothyroidism: Secondary | ICD-10-CM | POA: Diagnosis not present

## 2018-03-14 DIAGNOSIS — F3289 Other specified depressive episodes: Secondary | ICD-10-CM | POA: Diagnosis not present

## 2018-03-14 DIAGNOSIS — Z6830 Body mass index (BMI) 30.0-30.9, adult: Secondary | ICD-10-CM | POA: Diagnosis not present

## 2018-03-14 MED ORDER — BUPROPION HCL ER (SR) 150 MG PO TB12: 150.0000 mg | ORAL_TABLET | Freq: Every day | ORAL | 0 refills | Status: DC

## 2018-03-14 MED ORDER — LEVOTHYROXINE SODIUM 25 MCG PO TABS: 25.0000 ug | ORAL_TABLET | Freq: Every day | ORAL | 0 refills | Status: DC

## 2018-03-15 NOTE — Progress Notes (Signed)
Office: [phone]  /  Fax: [phone]   HPI:   Chief Complaint: OBESITY Philip [name] is here to discuss his progress with his obesity treatment plan. He is on the Category 2 plan and is following his eating plan approximately 60 % of the time. He states he is walking for 60 minutes 7 times per week. Aitan has done well maintaining weight. Hunger is controlled and he is exercising daily. He is walking 10,000 steps most days, especially while golfing. His weight is 206 lb (93.4 kg) today and has maintained weight over a period of 3 weeks since his last visit. He has lost 24 lbs since starting treatment with [place].  Hypothyroid Philip [name] has a diagnosis of hypothyroidism. He is well controlled on levothyroxine. He denies hot or cold intolerance or palpitations.  Depression with emotional eating behaviors Philip [name] is doing well with emotional eating. His blood pressure is stable and he has no worsening insomnia. Philip [name] struggles with emotional eating and using food for comfort to the extent that it is negatively impacting his health. He often snacks when he is not hungry. Philip [name] sometimes feels he is out of control and then feels guilty that he made poor food choices. He has been working on behavior modification techniques to help reduce his emotional eating and has been somewhat successful. His mood is stable on wellbutrin and he shows no sign of suicidal or homicidal ideations.  Depression screen Philip [name] 2/9 01/18/2017 12/06/2016 12/21/2015 06/19/2015 04/22/2014  Decreased Interest 3 1 0 0 0  Down, Depressed, Hopeless 1 0 0 0 0  PHQ - 2 Score 4 1 0 0 0  Altered sleeping 2 - - - -  Tired, decreased energy 2 - - - -  Change in appetite 1 - - - -  Feeling bad or failure about yourself  2 - - - -  Trouble concentrating 1 - - - -  Moving slowly or fidgety/restless 1 - - - -  Suicidal thoughts 0 - - - -  PHQ-9 Score 13 - - - -      ALLERGIES: No Known Allergies  MEDICATIONS: Current Outpatient  Medications on File Prior to Visit  Medication Sig Dispense Refill  . atorvastatin (LIPITOR) 40 MG tablet Take 1 tablet (40 mg total) by mouth at bedtime. 90 tablet 2  . B Complex-Biotin-FA (B-COMPLEX PO) Take 1 tablet by mouth daily.    . Cholecalciferol (VITAMIN D3) 5000 UNITS CAPS Take 5,000 Units by mouth daily.    Marland Kitchen co-enzyme Q-10 30 MG capsule Take 30 mg by mouth daily.    Marland Kitchen ELIQUIS 5 MG TABS tablet TAKE 1 TABLET TWICE DAILY 180 tablet 1  . fenofibrate (TRICOR) 48 MG tablet TAKE 1 TABLET EVERY DAY 90 tablet 2  . hydrocortisone 2.5 % cream Apply 1 application topically as needed.    Marland Kitchen lisinopril (PRINIVIL,ZESTRIL) 10 MG tablet Take 1 tablet (10 mg total) by mouth daily. 90 tablet 3  . magnesium oxide (MAG-OX) 400 MG tablet Take 800 mg by mouth daily.    . Melatonin 10 MG CAPS Take 10 mg at bedtime by mouth.    . Multiple Vitamins-Minerals (MULTIVITAMIN WITH MINERALS) tablet Take 1 tablet by mouth daily.    . Omega-3 Fatty Acids (FISH OIL) 1200 MG CAPS Take 1,200 mg by mouth daily.    . polyethylene glycol (MIRALAX / GLYCOLAX) packet Take 17 g by mouth daily.    Marland Kitchen amLODipine (NORVASC) 5 MG tablet Take 1 tablet (5 mg total) by  mouth daily. 90 tablet 3  . metoprolol succinate (TOPROL-XL) 50 MG 24 hr tablet Take 1 tablet (50 mg total) by mouth daily. Take with or immediately following a meal. 90 tablet 1   Current Facility-Administered Medications on File Prior to Visit  Medication Dose Route Frequency Provider Last Rate Last Dose  . triamcinolone acetonide (KENALOG) 10 MG/ML injection 10 mg  10 mg Other Once Trula Slade, DPM        PAST MEDICAL HISTORY: Past Medical History:  Diagnosis Date  . AAA (abdominal aortic aneurysm) ([place])   . Atrial fibrillation ([place])   . CAD (coronary artery disease)    had a MI , s/p CABG  . Cataracts, bilateral    immature  . CHF (congestive heart failure) ([place])   . Dysrhythmia    paroximal a fib  . GERD (gastroesophageal reflux disease)     takes Omeprazole daily  . History of colon polyps   . History of kidney stones   . History of shingles   . Hyperlipidemia   . Hypertension    takes Amlodipine,Metoprolol,and Lisinopril daily  . Joint pain   . Joint swelling   . Myocardial infarction [facility])    several with last one being 2001(but never knew about any except 2001)  . OSA (obstructive sleep apnea)    started CPAP 12/09  . Peripheral vascular disease ([place])   . Pneumonia    as a child  . Skin cancer    several , one of them was melanoma (sees derm routinely)  . Tingling    feet occasionally  . Tinnitus     PAST SURGICAL HISTORY: Past Surgical History:  Procedure Laterality Date  . ABDOMINAL AORTAGRAM N/A 06/16/2014   Procedure: ABDOMINAL Philip [name];  Surgeon: Angelia Mould, MD;  Location: [facility] CATH LAB;  Service: Cardiovascular;  Laterality: N/A;  . ABDOMINAL AORTIC ENDOVASCULAR STENT GRAFT N/A 07/08/2014   Procedure: ABDOMINAL AORTIC ENDOVASCULAR STENT GRAFT/ GORE;  Surgeon: Angelia Mould, MD;  Location: [place];  Service: Vascular;  Laterality: N/A;  . CARDIAC CATHETERIZATION  2015  . COLONOSCOPY    . CORONARY ARTERY BYPASS GRAFT  1999   x 3 vessels  . LEFT HEART CATHETERIZATION WITH CORONARY/GRAFT ANGIOGRAM N/A 05/29/2014   Procedure: LEFT HEART CATHETERIZATION WITH [name];  Surgeon: [name], MD;  Location: [facility] CATH LAB;  Service: Cardiovascular;  Laterality: N/A;  . PENILE PROSTHESIS IMPLANT  08/2005  . RHINOPLASTY  1975    SOCIAL HISTORY: Social History   Tobacco Use  . Smoking status: Former Smoker    Types: Cigarettes    Last attempt to quit: 1979    Years since quitting: 40.3  . Smokeless tobacco: Never Used  . Tobacco comment: quit smoking in 1979  Substance Use Topics  . Alcohol use: Yes    Comment: occasionally - once per week  . Drug use: No    FAMILY HISTORY: Family History  Problem Relation Age of Onset  . Prostate cancer Father 40  . Heart disease  Father   . Skin cancer Father   . Heart attack Father   . Cancer Father   . Deep vein thrombosis Father   . Hyperlipidemia Father   . Hypertension Father   . Heart disease Mother   . Skin cancer Mother   . Heart attack Mother   . Cancer Mother   . Hyperlipidemia Mother   . Hypertension Mother   . Varicose Veins Mother   . Peripheral  vascular disease Mother        amputation  . Sleep apnea Brother   . Hyperlipidemia Brother   . Hypertension Brother   . Prostate cancer Brother 59  . Colon cancer Other        aunt  . Skin cancer Brother   . Skin cancer Sister   . Cancer Sister   . Heart disease Sister   . Diabetes Neg Hx   . Stroke Neg Hx     ROS: Review of Systems  Constitutional: Negative for weight loss.  Cardiovascular: Negative for palpitations.  Endo/Heme/Allergies:       Negative for heat or cold intolerance  Psychiatric/Behavioral: Positive for depression. Negative for suicidal ideas. The patient has insomnia.     PHYSICAL EXAM: Blood pressure 131/72, pulse (!) 51, temperature 97.6 F (36.4 C), temperature source Oral, height 5\' 9"  (1.753 m), weight 206 lb (93.4 kg), SpO2 98 %. Body mass index is 30.42 kg/m. Physical Exam  Constitutional: He is oriented to person, place, and time. He appears well-developed and well-nourished.  Cardiovascular: Normal rate.  Pulmonary/Chest: Effort normal.  Musculoskeletal: Normal range of motion.  Neurological: He is oriented to person, place, and time.  Skin: Skin is warm and dry.  Psychiatric: He has a normal mood and affect. His behavior is normal.  Vitals reviewed.   RECENT LABS AND TESTS: BMET    Component Value Date/Time   NA 140 01/01/2018 1017   NA 144 08/14/2017 0913   K 5.1 01/01/2018 1017   CL 103 01/01/2018 1017   CO2 31 01/01/2018 1017   GLUCOSE 99 01/01/2018 1017   BUN 21 01/01/2018 1017   BUN 17 08/14/2017 0913   CREATININE 1.18 01/01/2018 1017   CREATININE 1.08 12/14/2015 1122   CALCIUM 10.1  01/01/2018 1017   GFRNONAA 68 08/14/2017 0913   GFRAA 79 08/14/2017 0913   Lab Results  Component Value Date   HGBA1C 5.6 08/14/2017   HGBA1C 5.7 (H) 04/19/2017   HGBA1C 5.9 (H) 01/18/2017   HGBA1C 6.1 12/07/2016   Lab Results  Component Value Date   INSULIN 10.8 08/14/2017   INSULIN 17.3 04/19/2017   INSULIN 16.1 01/18/2017   CBC    Component Value Date/Time   WBC 4.6 01/01/2018 1017   RBC 4.77 01/01/2018 1017   HGB 15.2 01/01/2018 1017   HGB 15.0 01/18/2017 0938   HCT 44.6 01/01/2018 1017   HCT 43.9 01/18/2017 0938   PLT 153.0 01/01/2018 1017   PLT 156 04/07/2010   MCV 93.6 01/01/2018 1017   MCV 92 01/18/2017 0938   MCH 31.5 01/18/2017 0938   MCH 31.1 12/14/2015 1122   MCHC 34.1 01/01/2018 1017   RDW 12.9 01/01/2018 1017   RDW 13.1 01/18/2017 0938   LYMPHSABS 1.6 01/01/2018 1017   LYMPHSABS 2.0 01/18/2017 0938   MONOABS 0.4 01/01/2018 1017   EOSABS 0.1 01/01/2018 1017   EOSABS 0.1 01/18/2017 0938   BASOSABS 0.0 01/01/2018 1017   BASOSABS 0.0 01/18/2017 0938   Iron/TIBC/Ferritin/ %Sat No results found for: IRON, TIBC, FERRITIN, IRONPCTSAT Lipid Panel     Component Value Date/Time   CHOL 113 08/14/2017 0913   TRIG 121 08/14/2017 0913   TRIG 256 09/14/2009 0000   HDL 42 08/14/2017 0913   CHOLHDL 3 12/07/2016 0852   VLDL 30.8 12/07/2016 0852   LDLCALC 47 08/14/2017 0913   LDLDIRECT 68.0 04/13/2015 0823   Hepatic Function Panel     Component Value Date/Time   PROT 6.6 08/14/2017  0913   ALBUMIN 4.6 08/14/2017 0913   AST 22 08/14/2017 0913   ALT 19 08/14/2017 0913   ALKPHOS 61 08/14/2017 0913   BILITOT 0.6 08/14/2017 0913      Component Value Date/Time   TSH 4.010 01/31/2018 1158   TSH 2.430 10/19/2017 0906   TSH 4.880 (H) 08/14/2017 0913   Results for Philip [name], Philip [name] (MRN [id number]) as of 03/15/2018 08:40  Ref. Range 08/14/2017 09:13  Vitamin D, 25-Hydroxy Latest Ref Range: 30.0 - 100.0 ng/mL 52.7   ASSESSMENT AND PLAN: Other specified  hypothyroidism - Plan: levothyroxine (SYNTHROID, LEVOTHROID) 25 MCG tablet  Other depression - with emotional eating  - Plan: buPROPion (WELLBUTRIN SR) 150 MG 12 hr tablet  Class 1 obesity with serious comorbidity and body mass index (BMI) of 30.0 to 30.9 in adult, unspecified obesity type  PLAN:  Hypothyroid Zadok was informed of the importance of good thyroid control to help with weight loss efforts. He was also informed that supertheraputic thyroid levels are dangerous and will not improve weight loss results. Kaiyan agrees to continue levothyroxine 25 mcg qd #30 with no refills and follow up as directed.  Depression with Emotional Eating Behaviors We discussed behavior modification techniques today to help Helton deal with his emotional eating and depression. He has agreed to take Wellbutrin SR 150 mg qd #30 with no refills and follow up as directed.  Obesity Jaaziah is currently in the action stage of change. As such, his goal is to continue with weight loss efforts He has agreed to follow the Category 2 plan Harvy has been instructed to work up to a goal of 150 minutes of combined cardio and strengthening exercise per week for weight loss and overall health benefits. We discussed the following Behavioral Modification Strategies today: increasing lean protein intake, travel eating strategies  and celebration eating strategies  Izak has agreed to follow up with our clinic in 4 weeks. He was informed of the importance of frequent follow up visits to maximize his success with intensive lifestyle modifications for his multiple health conditions.   I, Doreene Nest, am acting as transcriptionist for [name], MD  I have reviewed the above documentation for accuracy and completeness, and I agree with the above. -[name], MD

## 2018-03-25 ENCOUNTER — Other Ambulatory Visit: Payer: Self-pay | Admitting: Internal Medicine

## 2018-03-25 DIAGNOSIS — I2510 Atherosclerotic heart disease of native coronary artery without angina pectoris: Secondary | ICD-10-CM

## 2018-03-25 DIAGNOSIS — E782 Mixed hyperlipidemia: Secondary | ICD-10-CM

## 2018-03-26 DIAGNOSIS — H2511 Age-related nuclear cataract, right eye: Secondary | ICD-10-CM | POA: Diagnosis not present

## 2018-03-26 DIAGNOSIS — H25011 Cortical age-related cataract, right eye: Secondary | ICD-10-CM | POA: Diagnosis not present

## 2018-03-26 DIAGNOSIS — H52201 Unspecified astigmatism, right eye: Secondary | ICD-10-CM | POA: Diagnosis not present

## 2018-03-27 DIAGNOSIS — H2512 Age-related nuclear cataract, left eye: Secondary | ICD-10-CM | POA: Diagnosis not present

## 2018-03-27 NOTE — Telephone Encounter (Signed)
Please review for refill, Thanks !  

## 2018-04-11 ENCOUNTER — Ambulatory Visit (INDEPENDENT_AMBULATORY_CARE_PROVIDER_SITE_OTHER): Payer: Medicare HMO | Admitting: Family Medicine

## 2018-04-11 VITALS — BP 133/68 | HR 47 | Temp 97.5°F | Ht 69.0 in | Wt 208.0 lb

## 2018-04-11 DIAGNOSIS — E038 Other specified hypothyroidism: Secondary | ICD-10-CM | POA: Diagnosis not present

## 2018-04-11 DIAGNOSIS — Z6830 Body mass index (BMI) 30.0-30.9, adult: Secondary | ICD-10-CM | POA: Diagnosis not present

## 2018-04-11 DIAGNOSIS — F3289 Other specified depressive episodes: Secondary | ICD-10-CM

## 2018-04-11 DIAGNOSIS — E669 Obesity, unspecified: Secondary | ICD-10-CM

## 2018-04-11 MED ORDER — BUPROPION HCL ER (SR) 150 MG PO TB12: 150.0000 mg | ORAL_TABLET | Freq: Every day | ORAL | 0 refills | Status: DC

## 2018-04-11 MED ORDER — LEVOTHYROXINE SODIUM 25 MCG PO TABS: 25.0000 ug | ORAL_TABLET | Freq: Every day | ORAL | 0 refills | Status: DC

## 2018-04-11 NOTE — Progress Notes (Signed)
Office: [phone]  /  Fax: [phone]   HPI:   Chief Complaint: OBESITY Philip [name] is here to discuss his progress with his obesity treatment plan. He is on the Category 2 plan and is following his eating plan approximately 50 % of the time. He states he is doing push ups and walking for 60 minutes 7 times per week. Philip [name] had his sister staying with him for three weeks and had increased celebration eating and increased eating out. He is ready to get back on track. Philip [name] walks between holes while playing golf. His weight is 208 lb (94.3 kg) today and has had a weight gain of 2 pounds over a period of 4 weeks since his last visit. He has lost 22 lbs since starting treatment with [place].  Hypothyroid Philip [name] has a diagnosis of hypothyroidism. He is well controlled on levothyroxine. He denies hot or cold intolerance.   Depression with emotional eating behaviors Philip [name] has decreased emotional eating. He struggles with emotional eating and using food for comfort to the extent that it is negatively impacting his health. He often snacks when he is not hungry. Philip [name] sometimes feels he is out of control and then feels guilty that he made poor food choices. He has been working on behavior modification techniques to help reduce his emotional eating and has been somewhat successful. His mood is stable on Wellbutrin and his blood pressure is stable. He shows no sign of suicidal or homicidal ideations.  Depression screen [facility] 2/9 01/18/2017 12/06/2016 12/21/2015 06/19/2015 04/22/2014  Decreased Interest 3 1 0 0 0  Down, Depressed, Hopeless 1 0 0 0 0  PHQ - 2 Score 4 1 0 0 0  Altered sleeping 2 - - - -  Tired, decreased energy 2 - - - -  Change in appetite 1 - - - -  Feeling bad or failure about yourself  2 - - - -  Trouble concentrating 1 - - - -  Moving slowly or fidgety/restless 1 - - - -  Suicidal thoughts 0 - - - -  PHQ-9 Score 13 - - - -     ALLERGIES: No Known Allergies  MEDICATIONS: Current  Outpatient Medications on File Prior to Visit  Medication Sig Dispense Refill  . atorvastatin (LIPITOR) 40 MG tablet Take 1 tablet (40 mg total) by mouth at bedtime. 90 tablet 2  . B Complex-Biotin-FA (B-COMPLEX PO) Take 1 tablet by mouth daily.    Marland Kitchen buPROPion (WELLBUTRIN SR) 150 MG 12 hr tablet Take 1 tablet (150 mg total) by mouth daily. 30 tablet 0  . Cholecalciferol (VITAMIN D3) 5000 UNITS CAPS Take 5,000 Units by mouth daily.    Marland Kitchen co-enzyme Q-10 30 MG capsule Take 30 mg by mouth daily.    Marland Kitchen ELIQUIS 5 MG TABS tablet TAKE 1 TABLET TWICE DAILY 180 tablet 1  . fenofibrate (TRICOR) 48 MG tablet TAKE 1 TABLET EVERY DAY 90 tablet 2  . hydrocortisone 2.5 % cream Apply 1 application topically as needed.    Marland Kitchen levothyroxine (SYNTHROID, LEVOTHROID) 25 MCG tablet Take 1 tablet (25 mcg total) by mouth daily before breakfast. 30 tablet 0  . lisinopril (PRINIVIL,ZESTRIL) 10 MG tablet Take 1 tablet (10 mg total) by mouth daily. 90 tablet 3  . magnesium oxide (MAG-OX) 400 MG tablet Take 800 mg by mouth daily.    . Melatonin 10 MG CAPS Take 10 mg at bedtime by mouth.    . metoprolol succinate (TOPROL-XL) 50 MG 24 hr tablet TAKE  1 TABLET (50 MG) DAILY. TAKE WITH OR IMMEDIATELY FOLLOWING A MEAL. 90 tablet 2  . Multiple Vitamins-Minerals (MULTIVITAMIN WITH MINERALS) tablet Take 1 tablet by mouth daily.    . Omega-3 Fatty Acids (FISH OIL) 1200 MG CAPS Take 1,200 mg by mouth daily.    . polyethylene glycol (MIRALAX / GLYCOLAX) packet Take 17 g by mouth daily.    Marland Kitchen amLODipine (NORVASC) 5 MG tablet Take 1 tablet (5 mg total) by mouth daily. 90 tablet 3   Current Facility-Administered Medications on File Prior to Visit  Medication Dose Route Frequency Provider Last Rate Last Dose  . triamcinolone acetonide (KENALOG) 10 MG/ML injection 10 mg  10 mg Other Once Trula Slade, DPM        PAST MEDICAL HISTORY: Past Medical History:  Diagnosis Date  . AAA (abdominal aortic aneurysm) ([place])   . Atrial  fibrillation ([place])   . CAD (coronary artery disease)    had a MI , s/p CABG  . Cataracts, bilateral    immature  . CHF (congestive heart failure) ([place])   . Dysrhythmia    paroximal a fib  . GERD (gastroesophageal reflux disease)    takes Omeprazole daily  . History of colon polyps   . History of kidney stones   . History of shingles   . Hyperlipidemia   . Hypertension    takes Amlodipine,Metoprolol,and Lisinopril daily  . Joint pain   . Joint swelling   . Myocardial infarction [facility])    several with last one being 2001(but never knew about any except 2001)  . OSA (obstructive sleep apnea)    started CPAP 12/09  . Peripheral vascular disease ([place])   . Pneumonia    as a child  . Skin cancer    several , one of them was melanoma (sees derm routinely)  . Tingling    feet occasionally  . Tinnitus     PAST SURGICAL HISTORY: Past Surgical History:  Procedure Laterality Date  . ABDOMINAL AORTAGRAM N/A 06/16/2014   Procedure: ABDOMINAL Maxcine Ham;  Surgeon: Angelia Mould, MD;  Location: [facility] CATH LAB;  Service: Cardiovascular;  Laterality: N/A;  . ABDOMINAL AORTIC ENDOVASCULAR STENT GRAFT N/A 07/08/2014   Procedure: ABDOMINAL AORTIC ENDOVASCULAR STENT GRAFT/ GORE;  Surgeon: Angelia Mould, MD;  Location: [place];  Service: Vascular;  Laterality: N/A;  . CARDIAC CATHETERIZATION  2015  . COLONOSCOPY    . CORONARY ARTERY BYPASS GRAFT  1999   x 3 vessels  . LEFT HEART CATHETERIZATION WITH CORONARY/GRAFT ANGIOGRAM N/A 05/29/2014   Procedure: LEFT HEART CATHETERIZATION WITH [name];  Surgeon: [name], MD;  Location: [facility] CATH LAB;  Service: Cardiovascular;  Laterality: N/A;  . PENILE PROSTHESIS IMPLANT  08/2005  . RHINOPLASTY  1975    SOCIAL HISTORY: Social History   Tobacco Use  . Smoking status: Former Smoker    Types: Cigarettes    Last attempt to quit: 1979    Years since quitting: 40.4  . Smokeless tobacco: Never Used  . Tobacco comment:  quit smoking in 1979  Substance Use Topics  . Alcohol use: Yes    Comment: occasionally - once per week  . Drug use: No    FAMILY HISTORY: Family History  Problem Relation Age of Onset  . Prostate cancer Father 57  . Heart disease Father   . Skin cancer Father   . Heart attack Father   . Cancer Father   . Deep vein thrombosis Father   .  Hyperlipidemia Father   . Hypertension Father   . Heart disease Mother   . Skin cancer Mother   . Heart attack Mother   . Cancer Mother   . Hyperlipidemia Mother   . Hypertension Mother   . Varicose Veins Mother   . Peripheral vascular disease Mother        amputation  . Sleep apnea Brother   . Hyperlipidemia Brother   . Hypertension Brother   . Prostate cancer Brother 108  . Colon cancer Other        aunt  . Skin cancer Brother   . Skin cancer Sister   . Cancer Sister   . Heart disease Sister   . Diabetes Neg Hx   . Stroke Neg Hx     ROS: Review of Systems  Constitutional: Negative for weight loss.  Endo/Heme/Allergies:       Negative for heat or cold intolerance  Psychiatric/Behavioral: Positive for depression. Negative for suicidal ideas.    PHYSICAL EXAM: Blood pressure 133/68, pulse (!) 47, temperature (!) 97.5 F (36.4 C), temperature source Oral, height 5\' 9"  (1.753 m), weight 208 lb (94.3 kg), SpO2 97 %. Body mass index is 30.72 kg/m. Physical Exam  Constitutional: He is oriented to person, place, and time. He appears well-developed and well-nourished.  Cardiovascular: Normal rate.  Pulmonary/Chest: Effort normal.  Musculoskeletal: Normal range of motion.  Neurological: He is oriented to person, place, and time.  Skin: Skin is warm and dry.  Psychiatric: He has a normal mood and affect. His behavior is normal.  Vitals reviewed.   RECENT LABS AND TESTS: BMET    Component Value Date/Time   NA 140 01/01/2018 1017   NA 144 08/14/2017 0913   K 5.1 01/01/2018 1017   CL 103 01/01/2018 1017   CO2 31 01/01/2018  1017   GLUCOSE 99 01/01/2018 1017   BUN 21 01/01/2018 1017   BUN 17 08/14/2017 0913   CREATININE 1.18 01/01/2018 1017   CREATININE 1.08 12/14/2015 1122   CALCIUM 10.1 01/01/2018 1017   GFRNONAA 68 08/14/2017 0913   GFRAA 79 08/14/2017 0913   Lab Results  Component Value Date   HGBA1C 5.6 08/14/2017   HGBA1C 5.7 (H) 04/19/2017   HGBA1C 5.9 (H) 01/18/2017   HGBA1C 6.1 12/07/2016   Lab Results  Component Value Date   INSULIN 10.8 08/14/2017   INSULIN 17.3 04/19/2017   INSULIN 16.1 01/18/2017   CBC    Component Value Date/Time   WBC 4.6 01/01/2018 1017   RBC 4.77 01/01/2018 1017   HGB 15.2 01/01/2018 1017   HGB 15.0 01/18/2017 0938   HCT 44.6 01/01/2018 1017   HCT 43.9 01/18/2017 0938   PLT 153.0 01/01/2018 1017   PLT 156 04/07/2010   MCV 93.6 01/01/2018 1017   MCV 92 01/18/2017 0938   MCH 31.5 01/18/2017 0938   MCH 31.1 12/14/2015 1122   MCHC 34.1 01/01/2018 1017   RDW 12.9 01/01/2018 1017   RDW 13.1 01/18/2017 0938   LYMPHSABS 1.6 01/01/2018 1017   LYMPHSABS 2.0 01/18/2017 0938   MONOABS 0.4 01/01/2018 1017   EOSABS 0.1 01/01/2018 1017   EOSABS 0.1 01/18/2017 0938   BASOSABS 0.0 01/01/2018 1017   BASOSABS 0.0 01/18/2017 0938   Iron/TIBC/Ferritin/ %Sat No results found for: IRON, TIBC, FERRITIN, IRONPCTSAT Lipid Panel     Component Value Date/Time   CHOL 113 08/14/2017 0913   TRIG 121 08/14/2017 0913   TRIG 256 09/14/2009 0000   HDL 42 08/14/2017 0913  CHOLHDL 3 12/07/2016 0852   VLDL 30.8 12/07/2016 0852   LDLCALC 47 08/14/2017 0913   LDLDIRECT 68.0 04/13/2015 0823   Hepatic Function Panel     Component Value Date/Time   PROT 6.6 08/14/2017 0913   ALBUMIN 4.6 08/14/2017 0913   AST 22 08/14/2017 0913   ALT 19 08/14/2017 0913   ALKPHOS 61 08/14/2017 0913   BILITOT 0.6 08/14/2017 0913      Component Value Date/Time   TSH 4.010 01/31/2018 1158   TSH 2.430 10/19/2017 0906   TSH 4.880 (H) 08/14/2017 0913   Results for MARQUEST, GUNKEL (MRN  [id number]) as of 04/11/2018 12:02  Ref. Range 08/14/2017 09:13  Vitamin D, 25-Hydroxy Latest Ref Range: 30.0 - 100.0 ng/mL 52.7   ASSESSMENT AND PLAN: Other specified hypothyroidism - Plan: levothyroxine (SYNTHROID, LEVOTHROID) 25 MCG tablet  Other depression - with emotional eating  - Plan: buPROPion (WELLBUTRIN SR) 150 MG 12 hr tablet  Class 1 obesity with serious comorbidity and body mass index (BMI) of 30.0 to 30.9 in adult, unspecified obesity type  PLAN:  Hypothyroid Philip [name] was informed of the importance of good thyroid control to help with weight loss efforts. He was also informed that supertheraputic thyroid levels are dangerous and will not improve weight loss results. He agrees to continue levothyroxine 25 mcg qd #30 with no refills and follow up as directed.  Depression with Emotional Eating Behaviors We discussed behavior modification techniques today to help Philip [name] deal with his emotional eating and depression. He has agreed to take Wellbutrin SR 150 mg qd #30 with no refills and follow up as directed.  Obesity Philip [name] is currently in the action stage of change. As such, his goal is to continue with weight loss efforts He has agreed to follow the Category 2 plan Philip [name] has been instructed to work up to a goal of 150 minutes of combined cardio and strengthening exercise per week or continue doing push ups and walking for 60 minutes 7 times per week for weight loss and overall health benefits. We discussed the following Behavioral Modification Strategies today: increasing lean protein intake, decrease eating out and dealing with family or coworker sabotage  Philip [name] has agreed to follow up with our clinic in 4 weeks. He was informed of the importance of frequent follow up visits to maximize his success with intensive lifestyle modifications for his multiple health conditions.  I, Doreene Nest, am acting as transcriptionist for [name], MD  I have reviewed the above  documentation for accuracy and completeness, and I agree with the above. -[name], MD

## 2018-04-16 DIAGNOSIS — H52202 Unspecified astigmatism, left eye: Secondary | ICD-10-CM | POA: Diagnosis not present

## 2018-04-16 DIAGNOSIS — H25012 Cortical age-related cataract, left eye: Secondary | ICD-10-CM | POA: Diagnosis not present

## 2018-04-16 DIAGNOSIS — H25042 Posterior subcapsular polar age-related cataract, left eye: Secondary | ICD-10-CM | POA: Diagnosis not present

## 2018-04-16 DIAGNOSIS — H2512 Age-related nuclear cataract, left eye: Secondary | ICD-10-CM | POA: Diagnosis not present

## 2018-04-23 NOTE — Telephone Encounter (Signed)
Opened/entered in error

## 2018-04-30 ENCOUNTER — Other Ambulatory Visit: Payer: Self-pay | Admitting: Internal Medicine

## 2018-04-30 DIAGNOSIS — I2510 Atherosclerotic heart disease of native coronary artery without angina pectoris: Secondary | ICD-10-CM

## 2018-04-30 DIAGNOSIS — R5383 Other fatigue: Secondary | ICD-10-CM

## 2018-04-30 DIAGNOSIS — R0609 Other forms of dyspnea: Secondary | ICD-10-CM

## 2018-04-30 NOTE — Telephone Encounter (Signed)
Please review for refill, Thanks !  

## 2018-05-09 ENCOUNTER — Ambulatory Visit (INDEPENDENT_AMBULATORY_CARE_PROVIDER_SITE_OTHER): Payer: Medicare HMO | Admitting: Family Medicine

## 2018-05-09 VITALS — BP 125/70 | HR 58 | Temp 97.8°F | Ht 69.0 in | Wt 207.0 lb

## 2018-05-09 DIAGNOSIS — E669 Obesity, unspecified: Secondary | ICD-10-CM

## 2018-05-09 DIAGNOSIS — E038 Other specified hypothyroidism: Secondary | ICD-10-CM

## 2018-05-09 DIAGNOSIS — Z6830 Body mass index (BMI) 30.0-30.9, adult: Secondary | ICD-10-CM | POA: Diagnosis not present

## 2018-05-09 DIAGNOSIS — F3289 Other specified depressive episodes: Secondary | ICD-10-CM

## 2018-05-09 MED ORDER — BUPROPION HCL ER (SR) 150 MG PO TB12: 150.0000 mg | ORAL_TABLET | Freq: Every day | ORAL | 0 refills | Status: DC

## 2018-05-09 NOTE — Progress Notes (Signed)
Office: [phone]  /  Fax: [phone]   HPI:   Chief Complaint: OBESITY Philip [name] is here to discuss his progress with his obesity treatment plan. He is on the Category 2 plan and is following his eating plan approximately 50 % of the time. He states he is walking 60 minutes 7 times per week and doing 50 push ups a day. Philip [name] did well with weight loss, despite not being as active the last few weeks after having cataract surgery. He wants to go back to the category 3 plan, because he felt he was getting more protein. His weight is 207 lb (93.9 kg) today and has had a weight loss of 1 pound over a period of 4 weeks since his last visit. He has lost 23 lbs since starting treatment with [place].  Hypothyroid Philip [name] has a diagnosis of hypothyroidism. He is currently on levothyroxine. He denies hot or cold intolerance or excessive fatigue.  Depression with emotional eating behaviors Philip [name] is on Bupropion currently. There are no reports of emotional eating behavior. Philip [name] denies depression. He struggles with emotional eating and using food for comfort to the extent that it is negatively impacting his health. He often snacks when he is not hungry. Philip [name] sometimes feels he is out of control and then feels guilty that he made poor food choices. He has been working on behavior modification techniques to help reduce his emotional eating and has been somewhat successful. He shows no sign of suicidal or homicidal ideations.  Depression screen Philip [name] 2/9 01/18/2017 12/06/2016 12/21/2015 06/19/2015 04/22/2014  Decreased Interest 3 1 0 0 0  Down, Depressed, Hopeless 1 0 0 0 0  PHQ - 2 Score 4 1 0 0 0  Altered sleeping 2 - - - -  Tired, decreased energy 2 - - - -  Change in appetite 1 - - - -  Feeling bad or failure about yourself  2 - - - -  Trouble concentrating 1 - - - -  Moving slowly or fidgety/restless 1 - - - -  Suicidal thoughts 0 - - - -  PHQ-9 Score 13 - - - -     ALLERGIES: No Known  Allergies  MEDICATIONS: Current Outpatient Medications on File Prior to Visit  Medication Sig Dispense Refill  . atorvastatin (LIPITOR) 40 MG tablet TAKE 1 TABLET (40 MG TOTAL) BY MOUTH AT BEDTIME. 90 tablet 2  . B Complex-Biotin-FA (B-COMPLEX PO) Take 1 tablet by mouth daily.    . Cholecalciferol (VITAMIN D3) 5000 UNITS CAPS Take 5,000 Units by mouth daily.    Marland Kitchen co-enzyme Q-10 30 MG capsule Take 30 mg by mouth daily.    Marland Kitchen ELIQUIS 5 MG TABS tablet TAKE 1 TABLET TWICE DAILY 180 tablet 1  . fenofibrate (TRICOR) 48 MG tablet TAKE 1 TABLET EVERY DAY 90 tablet 2  . hydrocortisone 2.5 % cream Apply 1 application topically as needed.    Marland Kitchen levothyroxine (SYNTHROID, LEVOTHROID) 25 MCG tablet Take 1 tablet (25 mcg total) by mouth daily before breakfast. 30 tablet 0  . lisinopril (PRINIVIL,ZESTRIL) 10 MG tablet Take 1 tablet (10 mg total) by mouth daily. 90 tablet 3  . magnesium oxide (MAG-OX) 400 MG tablet Take 800 mg by mouth daily.    . Melatonin 10 MG CAPS Take 10 mg at bedtime by mouth.    . metoprolol succinate (TOPROL-XL) 50 MG 24 hr tablet TAKE 1 TABLET (50 MG) DAILY. TAKE WITH OR IMMEDIATELY FOLLOWING A MEAL. 90 tablet 2  .  Multiple Vitamins-Minerals (MULTIVITAMIN WITH MINERALS) tablet Take 1 tablet by mouth daily.    . Omega-3 Fatty Acids (FISH OIL) 1200 MG CAPS Take 1,200 mg by mouth daily.    . polyethylene glycol (MIRALAX / GLYCOLAX) packet Take 17 g by mouth daily.    Marland Kitchen amLODipine (NORVASC) 5 MG tablet Take 1 tablet (5 mg total) by mouth daily. 90 tablet 3   Current Facility-Administered Medications on File Prior to Visit  Medication Dose Route Frequency Provider Last Rate Last Dose  . triamcinolone acetonide (KENALOG) 10 MG/ML injection 10 mg  10 mg Other Once Trula Slade, DPM        PAST MEDICAL HISTORY: Past Medical History:  Diagnosis Date  . AAA (abdominal aortic aneurysm) ([place])   . Atrial fibrillation ([place])   . CAD (coronary artery disease)    had a MI , s/p CABG  .  Cataracts, bilateral    immature  . CHF (congestive heart failure) ([place])   . Dysrhythmia    paroximal a fib  . GERD (gastroesophageal reflux disease)    takes Omeprazole daily  . History of colon polyps   . History of kidney stones   . History of shingles   . Hyperlipidemia   . Hypertension    takes Amlodipine,Metoprolol,and Lisinopril daily  . Joint pain   . Joint swelling   . Myocardial infarction [facility])    several with last one being 2001(but never knew about any except 2001)  . OSA (obstructive sleep apnea)    started CPAP 12/09  . Peripheral vascular disease ([place])   . Pneumonia    as a child  . Skin cancer    several , one of them was melanoma (sees derm routinely)  . Tingling    feet occasionally  . Tinnitus     PAST SURGICAL HISTORY: Past Surgical History:  Procedure Laterality Date  . ABDOMINAL AORTAGRAM N/A 06/16/2014   Procedure: ABDOMINAL Maxcine Ham;  Surgeon: Angelia Mould, MD;  Location: [facility] CATH LAB;  Service: Cardiovascular;  Laterality: N/A;  . ABDOMINAL AORTIC ENDOVASCULAR STENT GRAFT N/A 07/08/2014   Procedure: ABDOMINAL AORTIC ENDOVASCULAR STENT GRAFT/ GORE;  Surgeon: Angelia Mould, MD;  Location: [place];  Service: Vascular;  Laterality: N/A;  . CARDIAC CATHETERIZATION  2015  . COLONOSCOPY    . CORONARY ARTERY BYPASS GRAFT  1999   x 3 vessels  . LEFT HEART CATHETERIZATION WITH CORONARY/GRAFT ANGIOGRAM N/A 05/29/2014   Procedure: LEFT HEART CATHETERIZATION WITH [name];  Surgeon: [name], MD;  Location[name] C[facility] LAB;  Service: Cardiovascular;  Laterality: N/A;  . PENILE PROSTHESIS IMPLANT  08/2005  . RHINOPLASTY  1975    SOCIAL HISTORY: Social History   Tobacco Use  . Smoking status: Former Smoker    Types: Cigarettes    Last attempt to quit: 1979    Years since quitting: 40.5  . Smokeless tobacco: Never Used  . Tobacco comment: quit smoking in 1979  Substance Use Topics  . Alcohol use: Yes    Comment:  occasionally - once per week  . Drug use: No    FAMILY HISTORY: Family History  Problem Relation Age of Onset  . Prostate cancer Father 49  . Heart disease Father   . Skin cancer Father   . Heart attack Father   . Cancer Father   . Deep vein thrombosis Father   . Hyperlipidemia Father   . Hypertension Father   . Heart disease Mother   . Skin  cancer Mother   . Heart attack Mother   . Cancer Mother   . Hyperlipidemia Mother   . Hypertension Mother   . Varicose Veins Mother   . Peripheral vascular disease Mother        amputation  . Sleep apnea Brother   . Hyperlipidemia Brother   . Hypertension Brother   . Prostate cancer Brother 56  . Colon cancer Other        aunt  . Skin cancer Brother   . Skin cancer Sister   . Cancer Sister   . Heart disease Sister   . Diabetes Neg Hx   . Stroke Neg Hx     ROS: Review of Systems  Constitutional: Positive for malaise/fatigue and weight loss.  Endo/Heme/Allergies:       Negative for Heat or Cold intolerance  Psychiatric/Behavioral: Positive for depression. Negative for suicidal ideas.    PHYSICAL EXAM: Blood pressure 125/70, pulse (!) 58, temperature 97.8 F (36.6 C), temperature source Oral, height 5\' 9"  (1.753 m), weight 207 lb (93.9 kg), SpO2 97 %. Body mass index is 30.57 kg/m. Physical Exam  Constitutional: He is oriented to person, place, and time. He appears well-developed and well-nourished.  Cardiovascular: Normal rate.  Pulmonary/Chest: Effort normal.  Musculoskeletal: Normal range of motion.  Neurological: He is oriented to person, place, and time.  Skin: Skin is warm and dry.  Psychiatric: He has a normal mood and affect. His behavior is normal.  Vitals reviewed.   RECENT LABS AND TESTS: BMET    Component Value Date/Time   NA 140 01/01/2018 1017   NA 144 08/14/2017 0913   K 5.1 01/01/2018 1017   CL 103 01/01/2018 1017   CO2 31 01/01/2018 1017   GLUCOSE 99 01/01/2018 1017   BUN 21 01/01/2018 1017    BUN 17 08/14/2017 0913   CREATININE 1.18 01/01/2018 1017   CREATININE 1.08 12/14/2015 1122   CALCIUM 10.1 01/01/2018 1017   GFRNONAA 68 08/14/2017 0913   GFRAA 79 08/14/2017 0913   Lab Results  Component Value Date   HGBA1C 5.6 08/14/2017   HGBA1C 5.7 (H) 04/19/2017   HGBA1C 5.9 (H) 01/18/2017   HGBA1C 6.1 12/07/2016   Lab Results  Component Value Date   INSULIN 10.8 08/14/2017   INSULIN 17.3 04/19/2017   INSULIN 16.1 01/18/2017   CBC    Component Value Date/Time   WBC 4.6 01/01/2018 1017   RBC 4.77 01/01/2018 1017   HGB 15.2 01/01/2018 1017   HGB 15.0 01/18/2017 0938   HCT 44.6 01/01/2018 1017   HCT 43.9 01/18/2017 0938   PLT 153.0 01/01/2018 1017   PLT 156 04/07/2010   MCV 93.6 01/01/2018 1017   MCV 92 01/18/2017 0938   MCH 31.5 01/18/2017 0938   MCH 31.1 12/14/2015 1122   MCHC 34.1 01/01/2018 1017   RDW 12.9 01/01/2018 1017   RDW 13.1 01/18/2017 0938   LYMPHSABS 1.6 01/01/2018 1017   LYMPHSABS 2.0 01/18/2017 0938   MONOABS 0.4 01/01/2018 1017   EOSABS 0.1 01/01/2018 1017   EOSABS 0.1 01/18/2017 0938   BASOSABS 0.0 01/01/2018 1017   BASOSABS 0.0 01/18/2017 0938   Iron/TIBC/Ferritin/ %Sat No results found for: IRON, TIBC, FERRITIN, IRONPCTSAT Lipid Panel     Component Value Date/Time   CHOL 113 08/14/2017 0913   TRIG 121 08/14/2017 0913   TRIG 256 09/14/2009 0000   HDL 42 08/14/2017 0913   CHOLHDL 3 12/07/2016 0852   VLDL 30.8 12/07/2016 0852   LDLCALC 47  08/14/2017 0913   LDLDIRECT 68.0 04/13/2015 0823   Hepatic Function Panel     Component Value Date/Time   PROT 6.6 08/14/2017 0913   ALBUMIN 4.6 08/14/2017 0913   AST 22 08/14/2017 0913   ALT 19 08/14/2017 0913   ALKPHOS 61 08/14/2017 0913   BILITOT 0.6 08/14/2017 0913      Component Value Date/Time   TSH 4.010 01/31/2018 1158   TSH 2.430 10/19/2017 0906   TSH 4.880 (H) 08/14/2017 0913   Results for TAMARCUS, CONDIE (MRN [id number]) as of 05/09/2018 12:28  Ref. Range 08/14/2017 09:13   Vitamin D, 25-Hydroxy Latest Ref Range: 30.0 - 100.0 ng/mL 52.7   ASSESSMENT AND PLAN: Other specified hypothyroidism  Other depression - with emotional eating - Plan: buPROPion (WELLBUTRIN SR) 150 MG 12 hr tablet  Class 1 obesity with serious comorbidity and body mass index (BMI) of 30.0 to 30.9 in adult, unspecified obesity type  Other depression - with emotional eating  - Plan: buPROPion (WELLBUTRIN SR) 150 MG 12 hr tablet  PLAN:  Hypothyroid Mikel was informed of the importance of good thyroid control to help with weight loss efforts. He was also informed that supertheraputic thyroid levels are dangerous and will not improve weight loss results. Philip [name] will continue with levothyroxine and we will recheck labs at the next visit.  Depression with Emotional Eating Behaviors We discussed behavior modification techniques today to help Philip [name] deal with his emotional eating and depression. He has agreed to continue Bupropion 150 mg qd #30 with no refills and follow up as directed.  Obesity Philip [name] is currently in the action stage of change. As such, his goal is to continue with weight loss efforts He has agreed to follow the Category 3 plan Philip [name] has been instructed to work up to a goal of 150 minutes of combined cardio and strengthening exercise per week for weight loss and overall health benefits. We discussed the following Behavioral Modification Strategies today: increasing lean protein intake and keeping healthy foods in the home  Philip [name] has agreed to follow up with our clinic in 4 weeks. He was informed of the importance of frequent follow up visits to maximize his success with intensive lifestyle modifications for his multiple health conditions.  I, Doreene Nest, am acting as transcriptionist for [name], MD  I have reviewed the above documentation for accuracy and completeness, and I agree with the above. -[name], MD

## 2018-05-16 DIAGNOSIS — L812 Freckles: Secondary | ICD-10-CM | POA: Diagnosis not present

## 2018-05-16 DIAGNOSIS — D1801 Hemangioma of skin and subcutaneous tissue: Secondary | ICD-10-CM | POA: Diagnosis not present

## 2018-05-16 DIAGNOSIS — Z85828 Personal history of other malignant neoplasm of skin: Secondary | ICD-10-CM | POA: Diagnosis not present

## 2018-05-16 DIAGNOSIS — L570 Actinic keratosis: Secondary | ICD-10-CM | POA: Diagnosis not present

## 2018-05-16 DIAGNOSIS — L814 Other melanin hyperpigmentation: Secondary | ICD-10-CM | POA: Diagnosis not present

## 2018-05-16 DIAGNOSIS — C44319 Basal cell carcinoma of skin of other parts of face: Secondary | ICD-10-CM | POA: Diagnosis not present

## 2018-05-16 DIAGNOSIS — D229 Melanocytic nevi, unspecified: Secondary | ICD-10-CM | POA: Diagnosis not present

## 2018-05-16 DIAGNOSIS — L821 Other seborrheic keratosis: Secondary | ICD-10-CM | POA: Diagnosis not present

## 2018-05-16 DIAGNOSIS — D485 Neoplasm of uncertain behavior of skin: Secondary | ICD-10-CM | POA: Diagnosis not present

## 2018-05-28 ENCOUNTER — Telehealth: Payer: Self-pay | Admitting: *Deleted

## 2018-05-28 NOTE — Telephone Encounter (Signed)
Patient called, he has joined a gym and was wanting to see if there were any limitations for lifting weights, abdominal crunches or sit-ups. He has remote history of 4 component EVAR on 07-08-14 by Dr. Scot [name]. I reviewed this with Dr. Oneida Alar and he said that there were no restrictions. I called and LMTCB.

## 2018-06-06 ENCOUNTER — Ambulatory Visit (INDEPENDENT_AMBULATORY_CARE_PROVIDER_SITE_OTHER): Payer: Medicare HMO | Admitting: Family Medicine

## 2018-06-06 ENCOUNTER — Encounter (INDEPENDENT_AMBULATORY_CARE_PROVIDER_SITE_OTHER): Payer: Self-pay | Admitting: Family Medicine

## 2018-06-06 ENCOUNTER — Other Ambulatory Visit (INDEPENDENT_AMBULATORY_CARE_PROVIDER_SITE_OTHER): Payer: Self-pay | Admitting: Family Medicine

## 2018-06-06 VITALS — BP 114/76 | Temp 97.6°F | Ht 69.0 in | Wt 207.0 lb

## 2018-06-06 DIAGNOSIS — R001 Bradycardia, unspecified: Secondary | ICD-10-CM | POA: Diagnosis not present

## 2018-06-06 DIAGNOSIS — Z6830 Body mass index (BMI) 30.0-30.9, adult: Secondary | ICD-10-CM | POA: Diagnosis not present

## 2018-06-06 DIAGNOSIS — I10 Essential (primary) hypertension: Secondary | ICD-10-CM | POA: Diagnosis not present

## 2018-06-06 DIAGNOSIS — E559 Vitamin D deficiency, unspecified: Secondary | ICD-10-CM | POA: Diagnosis not present

## 2018-06-06 DIAGNOSIS — R739 Hyperglycemia, unspecified: Secondary | ICD-10-CM | POA: Diagnosis not present

## 2018-06-06 DIAGNOSIS — E669 Obesity, unspecified: Secondary | ICD-10-CM | POA: Diagnosis not present

## 2018-06-06 NOTE — Progress Notes (Signed)
Office: [phone]  /  Fax: [phone]   HPI:   Chief Complaint: OBESITY Philip [name] is here to discuss his progress with his obesity treatment plan. He is on the Category 3 plan and is following his eating plan approximately 70 % of the time. He states he is working out at gym for 60 minutes 3 times per week and walking for 30-60 minutes 7 times per week. Jamie has been increasing exercise, walking 30-60 minutes daily and exercise at the gym with strengthening and doing push ups. He states he feels much better with increased energy and decreased pain.  His weight is 207 lb (93.9 kg) today and has not lost weight since his last visit. He has lost 23 lbs since starting treatment with [place].  Bradycardia Alem is asymptomatic, energy is good and he is able to exercise better than normal. Heart rate on ausculation approximately 40, on EKG heart rate in the 50's and unchanged.  ALLERGIES: No Known Allergies  MEDICATIONS: Current Outpatient Medications on File Prior to Visit  Medication Sig Dispense Refill  . atorvastatin (LIPITOR) 40 MG tablet TAKE 1 TABLET (40 MG TOTAL) BY MOUTH AT BEDTIME. 90 tablet 2  . B Complex-Biotin-FA (B-COMPLEX PO) Take 1 tablet by mouth daily.    Marland Kitchen buPROPion (WELLBUTRIN SR) 150 MG 12 hr tablet Take 1 tablet (150 mg total) by mouth daily. 30 tablet 0  . Cholecalciferol (VITAMIN D3) 5000 UNITS CAPS Take 5,000 Units by mouth daily.    Marland Kitchen co-enzyme Q-10 30 MG capsule Take 30 mg by mouth daily.    Marland Kitchen ELIQUIS 5 MG TABS tablet TAKE 1 TABLET TWICE DAILY 180 tablet 1  . fenofibrate (TRICOR) 48 MG tablet TAKE 1 TABLET EVERY DAY 90 tablet 2  . hydrocortisone 2.5 % cream Apply 1 application topically as needed.    Marland Kitchen levothyroxine (SYNTHROID, LEVOTHROID) 25 MCG tablet Take 1 tablet (25 mcg total) by mouth daily before breakfast. 30 tablet 0  . lisinopril (PRINIVIL,ZESTRIL) 10 MG tablet Take 1 tablet (10 mg total) by mouth daily. 90 tablet 3  . magnesium oxide (MAG-OX) 400 MG  tablet Take 800 mg by mouth daily.    . Melatonin 10 MG CAPS Take 10 mg at bedtime by mouth.    . metoprolol succinate (TOPROL-XL) 50 MG 24 hr tablet TAKE 1 TABLET (50 MG) DAILY. TAKE WITH OR IMMEDIATELY FOLLOWING A MEAL. 90 tablet 2  . Multiple Vitamins-Minerals (MULTIVITAMIN WITH MINERALS) tablet Take 1 tablet by mouth daily.    . Omega-3 Fatty Acids (FISH OIL) 1200 MG CAPS Take 1,200 mg by mouth daily.    . polyethylene glycol (MIRALAX / GLYCOLAX) packet Take 17 g by mouth daily.    Marland Kitchen amLODipine (NORVASC) 5 MG tablet Take 1 tablet (5 mg total) by mouth daily. 90 tablet 3   Current Facility-Administered Medications on File Prior to Visit  Medication Dose Route Frequency Provider Last Rate Last Dose  . triamcinolone acetonide (KENALOG) 10 MG/ML injection 10 mg  10 mg Other Once Trula Slade, DPM        PAST MEDICAL HISTORY: Past Medical History:  Diagnosis Date  . AAA (abdominal aortic aneurysm) ([place])   . Atrial fibrillation ([place])   . CAD (coronary artery disease)    had a MI , s/p CABG  . Cataracts, bilateral    immature  . CHF (congestive heart failure) ([place])   . Dysrhythmia    paroximal a fib  . GERD (gastroesophageal reflux disease)  takes Omeprazole daily  . History of colon polyps   . History of kidney stones   . History of shingles   . Hyperlipidemia   . Hypertension    takes Amlodipine,Metoprolol,and Lisinopril daily  . Joint pain   . Joint swelling   . Myocardial infarction [facility])    several with last one being 2001(but never knew about any except 2001)  . OSA (obstructive sleep apnea)    started CPAP 12/09  . Peripheral vascular disease ([place])   . Pneumonia    as a child  . Skin cancer    several , one of them was melanoma (sees derm routinely)  . Tingling    feet occasionally  . Tinnitus     PAST SURGICAL HISTORY: Past Surgical History:  Procedure Laterality Date  . ABDOMINAL AORTAGRAM N/A 06/16/2014   Procedure: ABDOMINAL Maxcine Ham;  Surgeon:  Angelia Mould, MD;  Location: [facility] C[facility] LAB;  Service: Cardiovascular;  Laterality: N/A;  . ABDOMINAL AORTIC ENDOVASCULAR STENT GRAFT N/A 07/08/2014   Procedure: ABDOMINAL AORTIC ENDOVASCULAR STENT GRAFT/ GORE;  Surgeon: Angelia Mould, MD;  Location: [place];  Service: Vascular;  Laterality: N/A;  . CARDIAC CATHETERIZATION  2015  . COLONOSCOPY    . CORONARY ARTERY BYPASS GRAFT  1999   x 3 vessels  . LEFT HEART CATHETERIZATION WITH CORONARY/GRAFT ANGIOGRAM N/A 05/29/2014   Procedure: LEFT HEART CATHETERIZATION WITH [name];  Surgeon: [name], MD;  Location: [facility] CATH LAB;  Service: Cardiovascular;  Laterality: N/A;  . PENILE PROSTHESIS IMPLANT  08/2005  . RHINOPLASTY  1975    SOCIAL HISTORY: Social History   Tobacco Use  . Smoking status: Former Smoker    Types: Cigarettes    Last attempt to quit: 1979    Years since quitting: 40.6  . Smokeless tobacco: Never Used  . Tobacco comment: quit smoking in 1979  Substance Use Topics  . Alcohol use: Yes    Comment: occasionally - once per week  . Drug use: No    FAMILY HISTORY: Family History  Problem Relation Age of Onset  . Prostate cancer Father 55  . Heart disease Father   . Skin cancer Father   . Heart attack Father   . Cancer Father   . Deep vein thrombosis Father   . Hyperlipidemia Father   . Hypertension Father   . Heart disease Mother   . Skin cancer Mother   . Heart attack Mother   . Cancer Mother   . Hyperlipidemia Mother   . Hypertension Mother   . Varicose Veins Mother   . Peripheral vascular disease Mother        amputation  . Sleep apnea Brother   . Hyperlipidemia Brother   . Hypertension Brother   . Prostate cancer Brother 43  . Colon cancer Other        aunt  . Skin cancer Brother   . Skin cancer Sister   . Cancer Sister   . Heart disease Sister   . Diabetes Neg Hx   . Stroke Neg Hx     ROS: Review of Systems  Constitutional: Negative for weight loss.     PHYSICAL EXAM: Blood pressure 114/76, temperature 97.6 F (36.4 C), temperature source Oral, height 5\' 9"  (1.753 m), weight 207 lb (93.9 kg), SpO2 98 %. Body mass index is 30.57 kg/m. Physical Exam  Constitutional: He is oriented to person, place, and time. He appears well-developed and well-nourished.  Cardiovascular: Normal rate.  Pulmonary/Chest:  Effort normal.  Musculoskeletal: Normal range of motion.  Neurological: He is oriented to person, place, and time.  Skin: Skin is warm and dry.  Psychiatric: He has a normal mood and affect. His behavior is normal.  Vitals reviewed.   RECENT LABS AND TESTS: BMET    Component Value Date/Time   NA 140 01/01/2018 1017   NA 144 08/14/2017 0913   K 5.1 01/01/2018 1017   CL 103 01/01/2018 1017   CO2 31 01/01/2018 1017   GLUCOSE 99 01/01/2018 1017   BUN 21 01/01/2018 1017   BUN 17 08/14/2017 0913   CREATININE 1.18 01/01/2018 1017   CREATININE 1.08 12/14/2015 1122   CALCIUM 10.1 01/01/2018 1017   GFRNONAA 68 08/14/2017 0913   GFRAA 79 08/14/2017 0913   Lab Results  Component Value Date   HGBA1C 5.6 08/14/2017   HGBA1C 5.7 (H) 04/19/2017   HGBA1C 5.9 (H) 01/18/2017   HGBA1C 6.1 12/07/2016   Lab Results  Component Value Date   INSULIN 10.8 08/14/2017   INSULIN 17.3 04/19/2017   INSULIN 16.1 01/18/2017   CBC    Component Value Date/Time   WBC 4.6 01/01/2018 1017   RBC 4.77 01/01/2018 1017   HGB 15.2 01/01/2018 1017   HGB 15.0 01/18/2017 0938   HCT 44.6 01/01/2018 1017   HCT 43.9 01/18/2017 0938   PLT 153.0 01/01/2018 1017   PLT 156 04/07/2010   MCV 93.6 01/01/2018 1017   MCV 92 01/18/2017 0938   MCH 31.5 01/18/2017 0938   MCH 31.1 12/14/2015 1122   MCHC 34.1 01/01/2018 1017   RDW 12.9 01/01/2018 1017   RDW 13.1 01/18/2017 0938   LYMPHSABS 1.6 01/01/2018 1017   LYMPHSABS 2.0 01/18/2017 0938   MONOABS 0.4 01/01/2018 1017   EOSABS 0.1 01/01/2018 1017   EOSABS 0.1 01/18/2017 0938   BASOSABS 0.0 01/01/2018 1017    BASOSABS 0.0 01/18/2017 0938   Iron/TIBC/Ferritin/ %Sat No results found for: IRON, TIBC, FERRITIN, IRONPCTSAT Lipid Panel     Component Value Date/Time   CHOL 113 08/14/2017 0913   TRIG 121 08/14/2017 0913   TRIG 256 09/14/2009 0000   HDL 42 08/14/2017 0913   CHOLHDL 3 12/07/2016 0852   VLDL 30.8 12/07/2016 0852   LDLCALC 47 08/14/2017 0913   LDLDIRECT 68.0 04/13/2015 0823   Hepatic Function Panel     Component Value Date/Time   PROT 6.6 08/14/2017 0913   ALBUMIN 4.6 08/14/2017 0913   AST 22 08/14/2017 0913   ALT 19 08/14/2017 0913   ALKPHOS 61 08/14/2017 0913   BILITOT 0.6 08/14/2017 0913      Component Value Date/Time   TSH 4.010 01/31/2018 1158   TSH 2.430 10/19/2017 0906   TSH 4.880 (H) 08/14/2017 0913    ASSESSMENT AND PLAN: Bradycardia - Plan: EKG 12-Lead, Comprehensive metabolic panel, CBC with Differential/Platelet, Hemoglobin A1c, Insulin, random, Lipid panel, VITAMIN D 25 Hydroxy (Vit-D Deficiency, Fractures)  Class 1 obesity with serious comorbidity and body mass index (BMI) of 30.0 to 30.9 in adult, unspecified obesity type  PLAN:  Bradycardia EKG was done today. Akia agrees to stop metoprolol and follow up with our clinic in 2 weeks to recheck blood pressure and heart rate. He agreed to contact Cardiology if any chest discomfort or shortness of breath worsens, go to the emergency room.  We spent > than 50% of the 30 minute visit on the counseling as documented in the note.  Obesity Larron is currently in the action stage of change. As  such, his goal is to continue with weight loss efforts He has agreed to follow the Category 3 plan Dorsel has been instructed to work up to a goal of 150 minutes of combined cardio and strengthening exercise per week for weight loss and overall health benefits. We discussed the following Behavioral Modification Strategies today: increasing lean protein intake, decreasing simple carbohydrates  and work on meal planning  and easy cooking plans   Nadia has agreed to follow up with our clinic in 2 weeks. He was informed of the importance of frequent follow up visits to maximize his success with intensive lifestyle modifications for his multiple health conditions.   I, Trixie Dredge, am acting as transcriptionist for [name], MD  I have reviewed the above documentation for accuracy and completeness, and I agree with the above. -[name], MD

## 2018-06-07 LAB — COMPREHENSIVE METABOLIC PANEL
A/G RATIO: 2.5 — AB (ref 1.2–2.2)
ALT: 21 IU/L (ref 0–44)
AST: 24 IU/L (ref 0–40)
Albumin: 4.7 g/dL (ref 3.5–4.8)
Alkaline Phosphatase: 50 IU/L (ref 39–117)
BUN/Creatinine Ratio: 20 (ref 10–24)
BUN: 25 mg/dL (ref 8–27)
Bilirubin Total: 0.6 mg/dL (ref 0.0–1.2)
CO2: 25 mmol/L (ref 20–29)
Calcium: 9.7 mg/dL (ref 8.6–10.2)
Chloride: 102 mmol/L (ref 96–106)
Creatinine, Ser: 1.28 mg/dL — ABNORMAL HIGH (ref 0.76–1.27)
GFR, EST AFRICAN AMERICAN: 62 mL/min/{1.73_m2} (ref >59)
GFR, EST NON AFRICAN AMERICAN: 53 mL/min/{1.73_m2} — AB (ref >59)
GLUCOSE: 97 mg/dL (ref 65–99)
Globulin, Total: 1.9 g/dL (ref 1.5–4.5)
Potassium: 5 mmol/L (ref 3.5–5.2)
Sodium: 142 mmol/L (ref 134–144)
TOTAL PROTEIN: 6.6 g/dL (ref 6.0–8.5)

## 2018-06-07 LAB — CBC WITH DIFFERENTIAL/PLATELET
BASOS: 1 %
Basophils Absolute: 0 10*3/uL (ref 0.0–0.2)
EOS (ABSOLUTE): 0.1 10*3/uL (ref 0.0–0.4)
Eos: 2 %
HEMOGLOBIN: 14.6 g/dL (ref 13.0–17.7)
Hematocrit: 43 % (ref 37.5–51.0)
Immature Grans (Abs): 0 10*3/uL (ref 0.0–0.1)
Immature Granulocytes: 0 %
Lymphocytes Absolute: 1.4 10*3/uL (ref 0.7–3.1)
Lymphs: 32 %
MCH: 32.1 pg (ref 26.6–33.0)
MCHC: 34 g/dL (ref 31.5–35.7)
MCV: 95 fL (ref 79–97)
MONOCYTES: 11 %
Monocytes Absolute: 0.5 10*3/uL (ref 0.1–0.9)
NEUTROS PCT: 54 %
Neutrophils Absolute: 2.4 10*3/uL (ref 1.4–7.0)
PLATELETS: 147 10*3/uL — AB (ref 150–450)
RBC: 4.55 x10E6/uL (ref 4.14–5.80)
RDW: 14.2 % (ref 12.3–15.4)
WBC: 4.4 10*3/uL (ref 3.4–10.8)

## 2018-06-07 LAB — LIPID PANEL WITH LDL/HDL RATIO
Cholesterol, Total: 112 mg/dL (ref 100–199)
HDL: 46 mg/dL (ref >39)
LDL Calculated: 49 mg/dL (ref 0–99)
LDL/HDL RATIO: 1.1 ratio (ref 0.0–3.6)
Triglycerides: 84 mg/dL (ref 0–149)
VLDL CHOLESTEROL CAL: 17 mg/dL (ref 5–40)

## 2018-06-07 LAB — VITAMIN D 25 HYDROXY (VIT D DEFICIENCY, FRACTURES): VIT D 25 HYDROXY: 53.8 ng/mL (ref 30.0–100.0)

## 2018-06-07 LAB — HEMOGLOBIN A1C
Est. average glucose Bld gHb Est-mCnc: 120 mg/dL
HEMOGLOBIN A1C: 5.8 % — AB (ref 4.8–5.6)

## 2018-06-07 LAB — INSULIN, RANDOM: INSULIN: 9.6 u[IU]/mL (ref 2.6–24.9)

## 2018-06-10 ENCOUNTER — Other Ambulatory Visit: Payer: Self-pay | Admitting: Internal Medicine

## 2018-06-10 DIAGNOSIS — R0609 Other forms of dyspnea: Secondary | ICD-10-CM

## 2018-06-10 DIAGNOSIS — R5383 Other fatigue: Secondary | ICD-10-CM

## 2018-06-10 DIAGNOSIS — I2510 Atherosclerotic heart disease of native coronary artery without angina pectoris: Secondary | ICD-10-CM

## 2018-06-20 ENCOUNTER — Ambulatory Visit (INDEPENDENT_AMBULATORY_CARE_PROVIDER_SITE_OTHER): Payer: Medicare HMO | Admitting: Family Medicine

## 2018-06-20 VITALS — BP 126/69 | HR 85 | Temp 97.8°F | Ht 69.0 in | Wt 209.0 lb

## 2018-06-20 DIAGNOSIS — F3289 Other specified depressive episodes: Secondary | ICD-10-CM

## 2018-06-20 DIAGNOSIS — Z6830 Body mass index (BMI) 30.0-30.9, adult: Secondary | ICD-10-CM

## 2018-06-20 DIAGNOSIS — E669 Obesity, unspecified: Secondary | ICD-10-CM

## 2018-06-20 MED ORDER — BUPROPION HCL ER (SR) 150 MG PO TB12: 150.0000 mg | ORAL_TABLET | Freq: Every day | ORAL | 0 refills | Status: DC

## 2018-06-20 NOTE — Progress Notes (Signed)
Office: [phone]  /  Fax: [phone]   HPI:   Chief Complaint: OBESITY Monroe is here to discuss his progress with his obesity treatment plan. He is on the Category 3 plan and is following his eating plan approximately 60 % of the time. He states he is doing weights, stretching and strength training 60 minutes 3 times per week. Dequincy continues to work on exercise and diet, but he was traveling and he increased eating out. He is ready to get back on track. His weight is 209 lb (94.8 kg) today and has not lost weight since his last visit. He has lost 0 lbs since starting treatment with [place].  Depression with emotional eating behaviors Yassir is stable on Wellbutrin and he has done better with emotional eating. Raymondo struggles with emotional eating and using food for comfort to the extent that it is negatively impacting his health. He often snacks when he is not hungry. Kaison sometimes feels he is out of control and then feels guilty that he made poor food choices. He has been working on behavior modification techniques to help reduce his emotional eating and has been somewhat successful. 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 He shows no sign of suicidal or homicidal ideations.  Depression screen [facility] 2/9 01/18/2017 12/06/2016 12/21/2015 06/19/2015 04/22/2014  Decreased Interest 3 1 0 0 0  Down, Depressed, Hopeless 1 0 0 0 0  PHQ - 2 Score 4 1 0 0 0  Altered sleeping 2 - - - -  Tired, decreased energy 2 - - - -  Change in appetite 1 - - - -  Feeling bad or failure about yourself  2 - - - -  Trouble concentrating 1 - - - -  Moving slowly or fidgety/restless 1 - - - -  Suicidal thoughts 0 - - - -  PHQ-9 Score 13 - - - -      ALLERGIES: No Known Allergies  MEDICATIONS: Current Outpatient Medications on File Prior to Visit  Medication Sig Dispense Refill  . atorvastatin (LIPITOR) 40 MG tablet TAKE 1 TABLET (40 MG TOTAL) BY MOUTH AT BEDTIME. 90 tablet 2  . B  Complex-Biotin-FA (B-COMPLEX PO) Take 1 tablet by mouth daily.    Marland Kitchen buPROPion (WELLBUTRIN SR) 150 MG 12 hr tablet Take 1 tablet (150 mg total) by mouth daily. 30 tablet 0  . Cholecalciferol (VITAMIN D3) 5000 UNITS CAPS Take 5,000 Units by mouth daily.    Marland Kitchen co-enzyme Q-10 30 MG capsule Take 30 mg by mouth daily.    Marland Kitchen ELIQUIS 5 MG TABS tablet TAKE 1 TABLET TWICE DAILY 180 tablet 1  . fenofibrate (TRICOR) 48 MG tablet TAKE 1 TABLET EVERY DAY 90 tablet 3  . hydrocortisone 2.5 % cream Apply 1 application topically as needed.    Marland Kitchen levothyroxine (SYNTHROID, LEVOTHROID) 25 MCG tablet Take 1 tablet (25 mcg total) by mouth daily before breakfast. 30 tablet 0  . lisinopril (PRINIVIL,ZESTRIL) 10 MG tablet Take 1 tablet (10 mg total) by mouth daily. 90 tablet 3  . magnesium oxide (MAG-OX) 400 MG tablet Take 800 mg by mouth daily.    . Melatonin 10 MG CAPS Take 10 mg at bedtime by mouth.    . metoprolol succinate (TOPROL-XL) 50 MG 24 hr tablet TAKE 1 TABLET (50 MG) DAILY. TAKE WITH OR IMMEDIATELY FOLLOWING A MEAL. 90 tablet 2  . Multiple Vitamins-Minerals (MULTIVITAMIN WITH MINERALS) tablet Take 1 tablet by mouth daily.    Marland Kitchen  Omega-3 Fatty Acids (FISH OIL) 1200 MG CAPS Take 1,200 mg by mouth daily.    . polyethylene glycol (MIRALAX / GLYCOLAX) packet Take 17 g by mouth daily.    Marland Kitchen amLODipine (NORVASC) 5 MG tablet Take 1 tablet (5 mg total) by mouth daily. 90 tablet 3   Current Facility-Administered Medications on File Prior to Visit  Medication Dose Route Frequency Provider Last Rate Last Dose  . triamcinolone acetonide (KENALOG) 10 MG/ML injection 10 mg  10 mg Other Once Trula Slade, DPM        PAST MEDICAL HISTORY: Past Medical History:  Diagnosis Date  . AAA (abdominal aortic aneurysm) ([place])   . Atrial fibrillation ([place])   . CAD (coronary artery disease)    had a MI , s/p CABG  . Cataracts, bilateral    immature  . CHF (congestive heart failure) ([place])   . Dysrhythmia    paroximal a fib    . GERD (gastroesophageal reflux disease)    takes Omeprazole daily  . History of colon polyps   . History of kidney stones   . History of shingles   . Hyperlipidemia   . Hypertension    takes Amlodipine,Metoprolol,and Lisinopril daily  . Joint pain   . Joint swelling   . Myocardial infarction [facility])    several with last one being 2001(but never knew about any except 2001)  . OSA (obstructive sleep apnea)    started CPAP 12/09  . Peripheral vascular disease ([place])   . Pneumonia    as a child  . Skin cancer    several , one of them was melanoma (sees derm routinely)  . Tingling    feet occasionally  . Tinnitus     PAST SURGICAL HISTORY: Past Surgical History:  Procedure Laterality Date  . ABDOMINAL AORTAGRAM N/A 06/16/2014   Procedure: ABDOMINAL Maxcine Ham;  Surgeon: Angelia Mould, MD;  [facility] C[facility] LAB;  Service: Cardiovascular;  Laterality: N/A;  . ABDOMINAL AORTIC ENDOVASCULAR STENT GRAFT N/A 07/08/2014   Procedure: ABDOMINAL AORTIC ENDOVASCULAR STENT GRAFT/ GORE;  Surgeon: Angelia Mould, MD;  Location: [place];  Service: Vascular;  Laterality: N/A;  . CARDIAC CATHETERIZATION  2015  . COLONOSCOPY    . CORONARY ARTERY BYPASS GRAFT  1999   x 3 vessels  . LEFT HEART CATHETERIZATION WITH CORONARY/GRAFT ANGIOGRAM N/A 05/29/2014   Procedure: LEFT HEART CATHETERIZATION WITH [name];  Surgeon: [name], MD;  Location: [facility] CATH LAB;  Service: Cardiovascular;  Laterality: N/A;  . PENILE PROSTHESIS IMPLANT  08/2005  . RHINOPLASTY  1975    SOCIAL HISTORY: Social History   Tobacco Use  . Smoking status: Former Smoker    Types: Cigarettes    Last attempt to quit: 1979    Years since quitting: 40.6  . Smokeless tobacco: Never Used  . Tobacco comment: quit smoking in 1979  Substance Use Topics  . Alcohol use: Yes    Comment: occasionally - once per week  . Drug use: No    FAMILY HISTORY: Family History  Problem Relation Age of Onset  .  Prostate cancer Father 68  . Heart disease Father   . Skin cancer Father   . Heart attack Father   . Cancer Father   . Deep vein thrombosis Father   . Hyperlipidemia Father   . Hypertension Father   . Heart disease Mother   . Skin cancer Mother   . Heart attack Mother   . Cancer Mother   .  Hyperlipidemia Mother   . Hypertension Mother   . Varicose Veins Mother   . Peripheral vascular disease Mother        amputation  . Sleep apnea Brother   . Hyperlipidemia Brother   . Hypertension Brother   . Prostate cancer Brother 6  . Colon cancer Other        aunt  . Skin cancer Brother   . Skin cancer Sister   . Cancer Sister   . Heart disease Sister   . Diabetes Neg Hx   . Stroke Neg Hx     ROS: Review of Systems  Constitutional: Negative for weight loss.  HENT:       Positive for xerostoma  Psychiatric/Behavioral: Positive for depression. Negative for suicidal ideas.    PHYSICAL EXAM: Blood pressure 126/69, pulse 85, temperature 97.8 F (36.6 C), temperature source Oral, height 5\' 9"  (1.753 m), weight 209 lb (94.8 kg), SpO2 96 %. Body mass index is 30.86 kg/m. Physical Exam  Constitutional: He is oriented to person, place, and time. He appears well-developed and well-nourished.  Cardiovascular: Normal rate.  Pulmonary/Chest: Effort normal.  Musculoskeletal: Normal range of motion.  Neurological: He is oriented to person, place, and time.  Skin: Skin is warm and dry.  Psychiatric: He has a normal mood and affect. His behavior is normal.  Vitals reviewed.   RECENT LABS AND TESTS: BMET    Component Value Date/Time   NA 142 06/06/2018 0000   K 5.0 06/06/2018 0000   CL 102 06/06/2018 0000   CO2 25 06/06/2018 0000   GLUCOSE 97 06/06/2018 0000   GLUCOSE 99 01/01/2018 1017   BUN 25 06/06/2018 0000   CREATININE 1.28 (H) 06/06/2018 0000   CREATININE 1.08 12/14/2015 1122   CALCIUM 9.7 06/06/2018 0000   GFRNONAA 53 (L) 06/06/2018 0000   GFRAA 62 06/06/2018 0000    Lab Results  Component Value Date   HGBA1C 5.8 (H) 06/06/2018   HGBA1C 5.6 08/14/2017   HGBA1C 5.7 (H) 04/19/2017   HGBA1C 5.9 (H) 01/18/2017   HGBA1C 6.1 12/07/2016   Lab Results  Component Value Date   INSULIN 9.6 06/06/2018   INSULIN 10.8 08/14/2017   INSULIN 17.3 04/19/2017   INSULIN 16.1 01/18/2017   CBC    Component Value Date/Time   WBC 4.4 06/06/2018 0000   WBC 4.6 01/01/2018 1017   RBC 4.55 06/06/2018 0000   RBC 4.77 01/01/2018 1017   HGB 14.6 06/06/2018 0000   HCT 43.0 06/06/2018 0000   PLT 147 (L) 06/06/2018 0000   MCV 95 06/06/2018 0000   MCH 32.1 06/06/2018 0000   MCH 31.1 12/14/2015 1122   MCHC 34.0 06/06/2018 0000   MCHC 34.1 01/01/2018 1017   RDW 14.2 06/06/2018 0000   LYMPHSABS 1.4 06/06/2018 0000   MONOABS 0.4 01/01/2018 1017   EOSABS 0.1 06/06/2018 0000   BASOSABS 0.0 06/06/2018 0000   Iron/TIBC/Ferritin/ %Sat No results found for: IRON, TIBC, FERRITIN, IRONPCTSAT Lipid Panel     Component Value Date/Time   CHOL 112 06/06/2018 0000   TRIG 84 06/06/2018 0000   TRIG 256 09/14/2009 0000   HDL 46 06/06/2018 0000   CHOLHDL 3 12/07/2016 0852   VLDL 30.8 12/07/2016 0852   LDLCALC 49 06/06/2018 0000   LDLDIRECT 68.0 04/13/2015 0823   Hepatic Function Panel     Component Value Date/Time   PROT 6.6 06/06/2018 0000   ALBUMIN 4.7 06/06/2018 0000   AST 24 06/06/2018 0000   ALT 21 06/06/2018  0000   ALKPHOS 50 06/06/2018 0000   BILITOT 0.6 06/06/2018 0000      Component Value Date/Time   TSH 4.010 01/31/2018 1158   TSH 2.430 10/19/2017 0906   TSH 4.880 (H) 08/14/2017 0913   Results for YONAEL, TULLOCH (MRN [id number]) as of 06/20/2018 15:33  Ref. Range 06/06/2018 00:00  Vitamin D, 25-Hydroxy Latest Ref Range: 30.0 - 100.0 ng/mL 53.8   ASSESSMENT AND PLAN: Other depression - with emotional eating  - Plan: buPROPion (WELLBUTRIN SR) 150 MG 12 hr tablet  Class 1 obesity with serious comorbidity and body mass index (BMI) of 30.0 to 30.9 in  adult, unspecified obesity type  PLAN:  Depression with Emotional Eating Behaviors We discussed behavior modification techniques today to help Cashus deal with his emotional eating and depression. He has agreed to continue Wellbutrin SR 150 mg qd #30 with no refills and follow up as directed.  Obesity Angie is currently in the action stage of change. As such, his goal is to continue with weight loss efforts He has agreed to follow the Category 3 plan Lesslie has been instructed to work up to a goal of 150 minutes of combined cardio and strengthening exercise per week for weight loss and overall health benefits. We discussed the following Behavioral Modification Strategies today: decreasing simple carbohydrates  and decrease eating out  Keeon has agreed to follow up with our clinic in 3 weeks. He was informed of the importance of frequent follow up visits to maximize his success with intensive lifestyle modifications for his multiple health conditions.   OBESITY BEHAVIORAL INTERVENTION VISIT  Today's visit was # 27  Starting weight: 230 lbs Starting date: 01/18/17 Today's weight : 209 lbs Today's date: 06/20/2018 Total lbs lost to date: 21 At least 15 minutes were spent on discussing the following behavioral intervention visit.   ASK: We discussed the diagnosis of obesity with [name] today and Talha agreed to give [place] permission to discuss obesity behavioral modification therapy today.  ASSESS: Mostafa has the diagnosis of obesity and his BMI today is 30.85 Burgess is in the action stage of change   ADVISE: Anan was educated on the multiple health risks of obesity as well as the benefit of weight loss to improve his health. He was advised of the need for long term treatment and the importance of lifestyle modifications to improve his current health and to decrease his risk of future health problems.  AGREE: Multiple dietary modification options and treatment options were  discussed and  Jameire agreed to follow the recommendations documented in the above note.  ARRANGE: Quinlin was educated on the importance of frequent visits to treat obesity as outlined per CMS and USPSTF guidelines and agreed to schedule his next follow up appointment today.  I, Doreene Nest, am acting as transcriptionist for [name], MD  I have reviewed the above documentation for accuracy and completeness, and I agree with the above. -[name], MD

## 2018-06-21 ENCOUNTER — Encounter: Payer: Self-pay | Admitting: Internal Medicine

## 2018-06-30 ENCOUNTER — Other Ambulatory Visit (INDEPENDENT_AMBULATORY_CARE_PROVIDER_SITE_OTHER): Payer: Self-pay | Admitting: Physician Assistant

## 2018-06-30 DIAGNOSIS — E038 Other specified hypothyroidism: Secondary | ICD-10-CM

## 2018-07-09 ENCOUNTER — Ambulatory Visit (INDEPENDENT_AMBULATORY_CARE_PROVIDER_SITE_OTHER): Payer: Medicare HMO | Admitting: Family Medicine

## 2018-07-09 VITALS — BP 148/77 | HR 65 | Temp 98.2°F | Ht 69.0 in | Wt 208.0 lb

## 2018-07-09 DIAGNOSIS — E669 Obesity, unspecified: Secondary | ICD-10-CM | POA: Diagnosis not present

## 2018-07-09 DIAGNOSIS — Z85828 Personal history of other malignant neoplasm of skin: Secondary | ICD-10-CM | POA: Diagnosis not present

## 2018-07-09 DIAGNOSIS — I10 Essential (primary) hypertension: Secondary | ICD-10-CM | POA: Diagnosis not present

## 2018-07-09 DIAGNOSIS — F3289 Other specified depressive episodes: Secondary | ICD-10-CM | POA: Diagnosis not present

## 2018-07-09 DIAGNOSIS — Z6830 Body mass index (BMI) 30.0-30.9, adult: Secondary | ICD-10-CM | POA: Diagnosis not present

## 2018-07-09 DIAGNOSIS — L578 Other skin changes due to chronic exposure to nonionizing radiation: Secondary | ICD-10-CM | POA: Diagnosis not present

## 2018-07-10 MED ORDER — BUPROPION HCL ER (SR) 150 MG PO TB12: 150.0000 mg | ORAL_TABLET | Freq: Every day | ORAL | 0 refills | Status: DC

## 2018-07-10 NOTE — Progress Notes (Signed)
Office: [phone]  /  Fax: [phone]   HPI:   Chief Complaint: OBESITY Philip [name] is here to discuss his progress with his obesity treatment plan. He is on the Category 3 plan and is following his eating plan approximately 50 % of the time. He states he is walking for 60 minutes 7 times per week. Tyrian continues to do well with weight loss. He is caring for his wife with recent knee replacement and has had less time to exercise. He is doing well with his eating overall.  His weight is 208 lb (94.3 kg) today and has had a weight loss of 1 pound over a period of 2 to 3 weeks since his last visit. He has lost 22 lbs since starting treatment with [place].  Hypertension Philip [name] is a 77 y.o. male with hypertension. Philip [name]'s blood pressure is elevated today, normally well controlled on medications. He denies chest pain or headache. He is working weight loss to help control his blood pressure with the goal of decreasing his risk of heart attack and stroke. Philip [name]'s blood pressure is not currently controlled.  Depression with emotional eating behaviors Philip [name]'s mood is stable on Wellbutrin, his blood pressure is slightly elevated but normally well controlled. Philip [name] struggles with emotional eating and using food for comfort to the extent that it is negatively impacting his health. He often snacks when he is not hungry. Philip [name] sometimes feels he is out of control and then feels guilty that he made poor food choices. He has been working on behavior modification techniques to help reduce his emotional eating and has been somewhat successful. He shows no sign of suicidal or homicidal ideations.  Depression screen [facility] 2/9 01/18/2017 12/06/2016 12/21/2015 06/19/2015 04/22/2014  Decreased Interest 3 1 0 0 0  Down, Depressed, Hopeless 1 0 0 0 0  PHQ - 2 Score 4 1 0 0 0  Altered sleeping 2 - - - -  Tired, decreased energy 2 - - - -  Change in appetite 1 - - - -  Feeling bad or failure about yourself  2 - - -  -  Trouble concentrating 1 - - - -  Moving slowly or fidgety/restless 1 - - - -  Suicidal thoughts 0 - - - -  PHQ-9 Score 13 - - - -    ALLERGIES: No Known Allergies  MEDICATIONS: Current Outpatient Medications on File Prior to Visit  Medication Sig Dispense Refill  . atorvastatin (LIPITOR) 40 MG tablet TAKE 1 TABLET (40 MG TOTAL) BY MOUTH AT BEDTIME. 90 tablet 2  . B Complex-Biotin-FA (B-COMPLEX PO) Take 1 tablet by mouth daily.    . Cholecalciferol (VITAMIN D3) 5000 UNITS CAPS Take 5,000 Units by mouth daily.    Marland Kitchen co-enzyme Q-10 30 MG capsule Take 30 mg by mouth daily.    Marland Kitchen ELIQUIS 5 MG TABS tablet TAKE 1 TABLET TWICE DAILY 180 tablet 1  . fenofibrate (TRICOR) 48 MG tablet TAKE 1 TABLET EVERY DAY 90 tablet 3  . hydrocortisone 2.5 % cream Apply 1 application topically as needed.    Marland Kitchen levothyroxine (SYNTHROID, LEVOTHROID) 25 MCG tablet Take 1 tablet (25 mcg total) by mouth daily before breakfast. 30 tablet 0  . levothyroxine (SYNTHROID, LEVOTHROID) 25 MCG tablet TAKE 1 TABLET BY MOUTH ONCE DAILY BEFORE BREAKFAST 30 tablet 0  . lisinopril (PRINIVIL,ZESTRIL) 10 MG tablet Take 1 tablet (10 mg total) by mouth daily. 90 tablet 3  . magnesium oxide (MAG-OX) 400 MG tablet Take 800  mg by mouth daily.    . Melatonin 10 MG CAPS Take 10 mg at bedtime by mouth.    . Multiple Vitamins-Minerals (MULTIVITAMIN WITH MINERALS) tablet Take 1 tablet by mouth daily.    . Omega-3 Fatty Acids (FISH OIL) 1200 MG CAPS Take 1,200 mg by mouth daily.    . polyethylene glycol (MIRALAX / GLYCOLAX) packet Take 17 g by mouth daily.    Marland Kitchen amLODipine (NORVASC) 5 MG tablet Take 1 tablet (5 mg total) by mouth daily. 90 tablet 3   Current Facility-Administered Medications on File Prior to Visit  Medication Dose Route Frequency Provider Last Rate Last Dose  . triamcinolone acetonide (KENALOG) 10 MG/ML injection 10 mg  10 mg Other Once Trula Slade, DPM        PAST MEDICAL HISTORY: Past Medical History:    Diagnosis Date  . AAA (abdominal aortic aneurysm) ([place])   . Atrial fibrillation ([place])   . CAD (coronary artery disease)    had a MI , s/p CABG  . Cataracts, bilateral    immature  . CHF (congestive heart failure) ([place])   . Dysrhythmia    paroximal a fib  . GERD (gastroesophageal reflux disease)    takes Omeprazole daily  . History of colon polyps   . History of kidney stones   . History of shingles   . Hyperlipidemia   . Hypertension    takes Amlodipine,Metoprolol,and Lisinopril daily  . Joint pain   . Joint swelling   . Myocardial infarction [facility])    several with last one being 2001(but never knew about any except 2001)  . OSA (obstructive sleep apnea)    started CPAP 12/09  . Peripheral vascular disease ([place])   . Pneumonia    as a child  . Skin cancer    several , one of them was melanoma (sees derm routinely)  . Tingling    feet occasionally  . Tinnitus     PAST SURGICAL HISTORY: Past Surgical History:  Procedure Laterality Date  . ABDOMINAL AORTAGRAM N/A 06/16/2014   Procedure: ABDOMINAL Maxcine Ham;  Surgeon: Angelia Mould, MD;  Location: [facility] CATH LAB;  Service: Cardiovascular;  Laterality: N/A;  . ABDOMINAL AORTIC ENDOVASCULAR STENT GRAFT N/A 07/08/2014   Procedure: ABDOMINAL AORTIC ENDOVASCULAR STENT GRAFT/ GORE;  Surgeon: Angelia Mould, MD;  Location: [place];  Service: Vascular;  Laterality: N/A;  . CARDIAC CATHETERIZATION  2015  . COLONOSCOPY    . CORONARY ARTERY BYPASS GRAFT  1999   x 3 vessels  . LEFT HEART CATHETERIZATION WITH CORONARY/GRAFT ANGIOGRAM N/A 05/29/2014   Procedure: LEFT HEART CATHETERIZATION WITH [name];  Surgeon: [name], MD;  Location: [facility] CATH LAB;  Service: Cardiovascular;  Laterality: N/A;  . PENILE PROSTHESIS IMPLANT  08/2005  . RHINOPLASTY  1975    SOCIAL HISTORY: Social History   Tobacco Use  . Smoking status: Former Smoker    Types: Cigarettes    Last attempt to quit: 1979    Years since  quitting: 40.7  . Smokeless tobacco: Never Used  . Tobacco comment: quit smoking in 1979  Substance Use Topics  . Alcohol use: Yes    Comment: occasionally - once per week  . Drug use: No    FAMILY HISTORY: Family History  Problem Relation Age of Onset  . Prostate cancer Father 38  . Heart disease Father   . Skin cancer Father   . Heart attack Father   . Cancer Father   .  Deep vein thrombosis Father   . Hyperlipidemia Father   . Hypertension Father   . Heart disease Mother   . Skin cancer Mother   . Heart attack Mother   . Cancer Mother   . Hyperlipidemia Mother   . Hypertension Mother   . Varicose Veins Mother   . Peripheral vascular disease Mother        amputation  . Sleep apnea Brother   . Hyperlipidemia Brother   . Hypertension Brother   . Prostate cancer Brother 26  . Colon cancer Other        aunt  . Skin cancer Brother   . Skin cancer Sister   . Cancer Sister   . Heart disease Sister   . Diabetes Neg Hx   . Stroke Neg Hx     ROS: Review of Systems  Constitutional: Positive for weight loss.  Cardiovascular: Negative for chest pain.  Neurological: Negative for headaches.  Psychiatric/Behavioral: Positive for depression. Negative for suicidal ideas.    PHYSICAL EXAM: Blood pressure (!) 148/77, pulse 65, temperature 98.2 F (36.8 C), temperature source Oral, height 5\' 9"  (1.753 m), weight 208 lb (94.3 kg), SpO2 97 %. Body mass index is 30.72 kg/m. Physical Exam  Constitutional: He is oriented to person, place, and time. He appears well-developed and well-nourished.  Cardiovascular: Normal rate.  Pulmonary/Chest: Effort normal.  Musculoskeletal: Normal range of motion.  Neurological: He is oriented to person, place, and time.  Skin: Skin is warm and dry.  Psychiatric: He has a normal mood and affect. His behavior is normal.  Vitals reviewed.   RECENT LABS AND TESTS: BMET    Component Value Date/Time   NA 142 06/06/2018 0000   K 5.0 06/06/2018  0000   CL 102 06/06/2018 0000   CO2 25 06/06/2018 0000   GLUCOSE 97 06/06/2018 0000   GLUCOSE 99 01/01/2018 1017   BUN 25 06/06/2018 0000   CREATININE 1.28 (H) 06/06/2018 0000   CREATININE 1.08 12/14/2015 1122   CALCIUM 9.7 06/06/2018 0000   GFRNONAA 53 (L) 06/06/2018 0000   GFRAA 62 06/06/2018 0000   Lab Results  Component Value Date   HGBA1C 5.8 (H) 06/06/2018   HGBA1C 5.6 08/14/2017   HGBA1C 5.7 (H) 04/19/2017   HGBA1C 5.9 (H) 01/18/2017   HGBA1C 6.1 12/07/2016   Lab Results  Component Value Date   INSULIN 9.6 06/06/2018   INSULIN 10.8 08/14/2017   INSULIN 17.3 04/19/2017   INSULIN 16.1 01/18/2017   CBC    Component Value Date/Time   WBC 4.4 06/06/2018 0000   WBC 4.6 01/01/2018 1017   RBC 4.55 06/06/2018 0000   RBC 4.77 01/01/2018 1017   HGB 14.6 06/06/2018 0000   HCT 43.0 06/06/2018 0000   PLT 147 (L) 06/06/2018 0000   MCV 95 06/06/2018 0000   MCH 32.1 06/06/2018 0000   MCH 31.1 12/14/2015 1122   MCHC 34.0 06/06/2018 0000   MCHC 34.1 01/01/2018 1017   RDW 14.2 06/06/2018 0000   LYMPHSABS 1.4 06/06/2018 0000   MONOABS 0.4 01/01/2018 1017   EOSABS 0.1 06/06/2018 0000   BASOSABS 0.0 06/06/2018 0000   Iron/TIBC/Ferritin/ %Sat No results found for: IRON, TIBC, FERRITIN, IRONPCTSAT Lipid Panel     Component Value Date/Time   CHOL 112 06/06/2018 0000   TRIG 84 06/06/2018 0000   TRIG 256 09/14/2009 0000   HDL 46 06/06/2018 0000   CHOLHDL 3 12/07/2016 0852   VLDL 30.8 12/07/2016 0852   LDLCALC 49 06/06/2018 0000  LDLDIRECT 68.0 04/13/2015 0823   Hepatic Function Panel     Component Value Date/Time   PROT 6.6 06/06/2018 0000   ALBUMIN 4.7 06/06/2018 0000   AST 24 06/06/2018 0000   ALT 21 06/06/2018 0000   ALKPHOS 50 06/06/2018 0000   BILITOT 0.6 06/06/2018 0000      Component Value Date/Time   TSH 4.010 01/31/2018 1158   TSH 2.430 10/19/2017 0906   TSH 4.880 (H) 08/14/2017 0913    ASSESSMENT AND PLAN: Essential hypertension  Other  depression - with emotional eating  - Plan: buPROPion (WELLBUTRIN SR) 150 MG 12 hr tablet  Class 1 obesity with serious comorbidity and body mass index (BMI) of 30.0 to 30.9 in adult, unspecified obesity type  PLAN:  Hypertension We discussed sodium restriction, working on healthy weight loss, and a regular exercise program as the means to achieve improved blood pressure control. Audric agreed with this plan and agreed to follow up as directed. We Philip continue to monitor his blood pressure as well as his progress with the above lifestyle modifications. Dain agrees to continue his medications and diet, and Philip watch for signs of hypotension as he continues his lifestyle modifications. Ruben agrees to follow up with our clinic in 3 to 4 weeks and we Philip recheck blood pressure at that time.  Depression with Emotional Eating Behaviors We discussed behavior modification techniques today to help Christepher deal with his emotional eating and depression. Maurilio agrees to continue taking Wellbutrin SR 150 mg qd #30 and we Philip refill for 1 month. Tevin agrees to follow up with our clinic in 3 to 4 weeks.  Obesity Ilai is currently in the action stage of change. As such, his goal is to continue with weight loss efforts He has agreed to follow the Category 3 plan Maksim has been instructed to work up to a goal of 150 minutes of combined cardio and strengthening exercise per week for weight loss and overall health benefits. We discussed the following Behavioral Modification Strategies today: increasing lean protein intake, work on meal planning and easy cooking plans, and no skipping meals   Deep has agreed to follow up with our clinic in 3 to 4 weeks. He was informed of the importance of frequent follow up visits to maximize his success with intensive lifestyle modifications for his multiple health conditions.   OBESITY BEHAVIORAL INTERVENTION VISIT  Today's visit was # 28  Starting weight:  230 lbs Starting date: 01/18/17 Today's weight : 208 lbs  Today's date: 07/09/2018 Total lbs lost to date: 22 At least 15 minutes were spent on discussing the following behavioral intervention visit.   ASK: We discussed the diagnosis of obesity with [name] today and Siler agreed to give [place] permission to discuss obesity behavioral modification therapy today.  ASSESS: Gaelen has the diagnosis of obesity and his BMI today is 30.7 Kalel is in the action stage of change   ADVISE: Lark was educated on the multiple health risks of obesity as well as the benefit of weight loss to improve his health. He was advised of the need for long term treatment and the importance of lifestyle modifications to improve his current health and to decrease his risk of future health problems.  AGREE: Multiple dietary modification options and treatment options were discussed and  Yusuf agreed to follow the recommendations documented in the above note.  ARRANGE: My was educated on the importance of frequent visits to treat obesity as outlined per CMS and  USPSTF guidelines and agreed to schedule his next follow up appointment today.  I, Trixie Dredge, am acting as transcriptionist for [name], MD  I have reviewed the above documentation for accuracy and completeness, and I agree with the above. -[name], MD

## 2018-07-13 ENCOUNTER — Ambulatory Visit: Payer: Medicare HMO | Admitting: Internal Medicine

## 2018-07-13 ENCOUNTER — Encounter: Payer: Self-pay | Admitting: Internal Medicine

## 2018-07-13 ENCOUNTER — Other Ambulatory Visit: Payer: Self-pay | Admitting: Internal Medicine

## 2018-07-13 VITALS — BP 142/84 | HR 83 | Ht 69.0 in | Wt 216.0 lb

## 2018-07-13 DIAGNOSIS — I480 Paroxysmal atrial fibrillation: Secondary | ICD-10-CM | POA: Diagnosis not present

## 2018-07-13 DIAGNOSIS — I2510 Atherosclerotic heart disease of native coronary artery without angina pectoris: Secondary | ICD-10-CM

## 2018-07-13 DIAGNOSIS — E782 Mixed hyperlipidemia: Secondary | ICD-10-CM | POA: Diagnosis not present

## 2018-07-13 DIAGNOSIS — R5383 Other fatigue: Secondary | ICD-10-CM

## 2018-07-13 DIAGNOSIS — R0609 Other forms of dyspnea: Secondary | ICD-10-CM

## 2018-07-13 DIAGNOSIS — I10 Essential (primary) hypertension: Secondary | ICD-10-CM

## 2018-07-13 MED ORDER — OMEGA-3 FISH OIL 1200 MG PO CAPS: ORAL_CAPSULE | ORAL | 3 refills | Status: DC

## 2018-07-13 NOTE — Progress Notes (Addendum)
Follow-up Outpatient Visit Date: 07/13/2018  Primary Care Provider: Colon Branch, MD [address] [place] [address]  Chief Complaint: Follow-up CAD and PAF  HPI:  Philip [name] is a 77 y.o. year-old male with history of CAD status post CABG (1999), AAA status post endovascular repair (2015), paroxysmal atrial fibrillation, HTN, hyperlipidemia, and CKD stage III, who presents for follow-up of coronary artery disease and PAF.  I last saw him in February, at which time he was doing well other than mild leg edema and modest weight gain.  We subsequently obtained an echocardiogram, which showed preserved LV systolic function with grade 1 diastolic dysfunction.  No medication changes were made.  Today, Philip [name] reports feeling very well.  He is exercising regularly and playing golf without any limitations.  He denies chest pain, shortness of breath, palpitations, and lightheadedness.  Leg edema has improved and is minimal most days.  Home blood pressure is typically 120's/70's.  He denies medication side effects.  He has been under more stress recently, as he has been caring for his wife after she underwent orthopedic surgery ~3 weeks ago.  --------------------------------------------------------------------------------------------------  Cardiovascular History & Procedures: Cardiovascular Problems:  Paroxysmal atrial fibrillation  Coronary artery disease status post CABG  Abdominal aortic aneurysm status post endovascular repair  Risk Factors:  Known coronary artery disease, PAD, hypertension, hyperlipidemia, male gender, obesity, and age greater than 76  Cath/PCI:  LHC (05/29/14): LMCA occluded distally. LAD ostial occluded. LCx ostially occluded. RCA proximally occluded. Widely patent LIMA to LAD, SVG to OM, and SVG to distal RCA.  CV Surgery:  Endovascular AAA repair (07/08/14, Dr. Scot [name])  CABG (1999): LIMA to LAD, SVG to OM, and SVG to distal RCA  EP  Procedures and Devices:  None  Non-Invasive Evaluation(s):  Exercise MPI (01/12/17): Intermediate risk study with small in size, mild in severity mid anteroseptal defect consistent with scar. Small in size mild severity, mid anterior defect that is reversible and consistent with ischemia in the diagonal territory. LVEF 47%. Overall, study is stable to improved compared with the previous test in 2015.  Pharmacologic MPI (05/16/14): Intermediate risk study with reversible anterior and apical ischemia as well as fixed basal to mid inferior defect. LVEF 48% with basal to mid inferior hypokinesis.  TTE (05/16/14): Normal LV size and wall thickness with LVEF of 55-60%. Mild left atrial enlargement. Normal RV size and function. Mitral annular calcification with mildly thickened leaflets. Aortic sclerosis.  Recent CV Pertinent Labs: Lab Results  Component Value Date   CHOL 112 06/06/2018   HDL 46 06/06/2018   LDLCALC 49 06/06/2018   LDLDIRECT 68.0 04/13/2015   TRIG 84 06/06/2018   TRIG 256 09/14/2009   CHOLHDL 3 12/07/2016   INR 1.18 07/08/2014   K 5.0 06/06/2018   MG 1.8 07/08/2014   BUN 25 06/06/2018   CREATININE 1.28 (H) 06/06/2018   CREATININE 1.08 12/14/2015    Past medical and surgical history were reviewed and updated in EPIC.  Current Meds  Medication Sig  . amLODipine (NORVASC) 5 MG tablet Take 1 tablet (5 mg total) by mouth daily.  Marland Kitchen atorvastatin (LIPITOR) 40 MG tablet TAKE 1 TABLET (40 MG TOTAL) BY MOUTH AT BEDTIME.  . B Complex-Biotin-FA (B-COMPLEX PO) Take 1 tablet by mouth daily.  Marland Kitchen buPROPion (WELLBUTRIN SR) 150 MG 12 hr tablet Take 1 tablet (150 mg total) by mouth daily.  . Cholecalciferol (VITAMIN D3) 5000 UNITS CAPS Take 5,000 Units by mouth daily.  Marland Kitchen  co-enzyme Q-10 30 MG capsule Take 30 mg by mouth daily.  Marland Kitchen ELIQUIS 5 MG TABS tablet TAKE 1 TABLET TWICE DAILY  . fenofibrate (TRICOR) 48 MG tablet TAKE 1 TABLET EVERY DAY  . hydrocortisone 2.5 % cream Apply 1 application  topically as needed.  Marland Kitchen levothyroxine (SYNTHROID, LEVOTHROID) 25 MCG tablet Take 1 tablet (25 mcg total) by mouth daily before breakfast.  . lisinopril (PRINIVIL,ZESTRIL) 10 MG tablet Take 1 tablet (10 mg total) by mouth daily.  . magnesium oxide (MAG-OX) 400 MG tablet Take 800 mg by mouth daily.  . Melatonin 10 MG CAPS Take 10 mg at bedtime by mouth.  . Multiple Vitamins-Minerals (MULTIVITAMIN WITH MINERALS) tablet Take 1 tablet by mouth daily.  . Omega-3 Fatty Acids (FISH OIL) 1200 MG CAPS Take 1,200 mg by mouth daily.  . polyethylene glycol (MIRALAX / GLYCOLAX) packet Take 17 g by mouth daily.   Current Facility-Administered Medications for the 07/13/18 encounter (Office Visit) with Reeshemah Nazaryan, Harrell Gave, MD  Medication  . triamcinolone acetonide (KENALOG) 10 MG/ML injection 10 mg    Allergies: Patient has no known allergies.  Social History   Tobacco Use  . Smoking status: Former Smoker    Types: Cigarettes    Last attempt to quit: 1979    Years since quitting: 40.7  . Smokeless tobacco: Never Used  . Tobacco comment: quit smoking in 1979  Substance Use Topics  . Alcohol use: Yes    Comment: occasionally - once per week  . Drug use: No    Family History  Problem Relation Age of Onset  . Prostate cancer Father 63  . Heart disease Father   . Skin cancer Father   . Heart attack Father   . Cancer Father   . Deep vein thrombosis Father   . Hyperlipidemia Father   . Hypertension Father   . Heart disease Mother   . Skin cancer Mother   . Heart attack Mother   . Cancer Mother   . Hyperlipidemia Mother   . Hypertension Mother   . Varicose Veins Mother   . Peripheral vascular disease Mother        amputation  . Sleep apnea Brother   . Hyperlipidemia Brother   . Hypertension Brother   . Prostate cancer Brother 52  . Colon cancer Other        aunt  . Skin cancer Brother   . Skin cancer Sister   . Cancer Sister   . Heart disease Sister   . Diabetes Neg Hx   . Stroke Neg  Hx     Review of Systems: A 12-system review of systems was performed and was negative except as noted in the HPI.  --------------------------------------------------------------------------------------------------  Physical Exam: BP (!) 142/84   Pulse 83   Ht 5\' 9"  (1.753 m)   Wt 216 lb (98 kg)   SpO2 97%   BMI 31.90 kg/m   General:  NAD. HEENT: No conjunctival pallor or scleral icterus. Moist mucous membranes.  OP clear. Neck: Supple without lymphadenopathy, thyromegaly, JVD, or HJR. No carotid bruit. Lungs: Normal work of breathing. Clear to auscultation bilaterally without wheezes or crackles. Heart: Regular rate and rhythm without murmurs, rubs, or gallops. Non-displaced PMI. Abd: Bowel sounds present. Soft, NT/ND without hepatosplenomegaly Ext: No lower extremity edema. Radial, PT, and DP pulses are 2+ bilaterally. Skin: Warm and dry without rash.  EKG: Normal sinus rhythm with borderline LVH and inferior Q waves.  Compared with prior tracing from 12/22/2017, Q waves  in lead III or more pronounced.  Otherwise, no significant interval change.  Lab Results  Component Value Date   WBC 4.4 06/06/2018   HGB 14.6 06/06/2018   HCT 43.0 06/06/2018   MCV 95 06/06/2018   PLT 147 (L) 06/06/2018    Lab Results  Component Value Date   NA 142 06/06/2018   K 5.0 06/06/2018   CL 102 06/06/2018   CO2 25 06/06/2018   BUN 25 06/06/2018   CREATININE 1.28 (H) 06/06/2018   GLUCOSE 97 06/06/2018   ALT 21 06/06/2018    Lab Results  Component Value Date   CHOL 112 06/06/2018   HDL 46 06/06/2018   LDLCALC 49 06/06/2018   LDLDIRECT 68.0 04/13/2015   TRIG 84 06/06/2018   CHOLHDL 3 12/07/2016    --------------------------------------------------------------------------------------------------  ASSESSMENT AND PLAN: CAD without angina Doing very well.  Continue current medications for secondary prevention as well as regular exercise.  Pt not on ASA given long-term anticoagulation  with apixaban for PAF.  Paroxysmal atrial fibrillation No symptoms.  EKG today shows normal sinus rhythm.  We will continue indefinite anticoagulation with apixaban, given CHADSVASC score of at least 4.  Hypertension BP mildly elevated today but reportedly normal at home and with other providers.  Continue current medications.  If BP is persistently elevated, escalation of lisinopril will need to be considered.  Hyperlipidemia Lipids at goal.  Pt wonders if fenofibrate can be stopped.  Given good triglycerides last month, we have agreed to stop fenofibrate and increase fish oil to 2,400 mg daily.  We will recheck a lipid panel in a couple of months.  Philip [name] should continue atorvastatin.  Follow-up: Given my transition to [facility], I will have Philip [name] follow-up with Dr. Irish Lack in ~6 months.  Nelva Bush, MD 07/13/2018 8:34 AM

## 2018-07-13 NOTE — Patient Instructions (Addendum)
Medication Instructions:  STOP Fenofibrate Take Fish Oil 2 1,200mg  capsules daily  -- If you need a refill on your cardiac medications before your next appointment, please call your pharmacy. --  Labwork: 2 months FASTING LIPID PANEL  Testing/Procedures: None ordered  Follow-Up: Your physician wants you to follow-up in: 6 MONTHS WITH Dr Glennon Hamilton will receive a reminder letter in the mail two months in advance. If you don't receive a letter, please call our office to schedule the follow-up appointment.  Thank you for choosing [facility]!!    Any Other Special Instructions Will Be Listed Below (If Applicable).

## 2018-07-13 NOTE — Telephone Encounter (Signed)
Please review for refill. Patient just saw Dr. Saunders Revel today at [address].

## 2018-07-23 DIAGNOSIS — Z85828 Personal history of other malignant neoplasm of skin: Secondary | ICD-10-CM | POA: Diagnosis not present

## 2018-07-23 DIAGNOSIS — L578 Other skin changes due to chronic exposure to nonionizing radiation: Secondary | ICD-10-CM | POA: Diagnosis not present

## 2018-08-03 ENCOUNTER — Encounter (INDEPENDENT_AMBULATORY_CARE_PROVIDER_SITE_OTHER): Payer: Self-pay | Admitting: Family Medicine

## 2018-08-06 ENCOUNTER — Ambulatory Visit (INDEPENDENT_AMBULATORY_CARE_PROVIDER_SITE_OTHER): Payer: Medicare HMO | Admitting: Family Medicine

## 2018-08-06 VITALS — BP 144/68 | HR 69 | Temp 98.0°F | Ht 69.0 in | Wt 209.0 lb

## 2018-08-06 DIAGNOSIS — E038 Other specified hypothyroidism: Secondary | ICD-10-CM | POA: Diagnosis not present

## 2018-08-06 DIAGNOSIS — Z6831 Body mass index (BMI) 31.0-31.9, adult: Secondary | ICD-10-CM

## 2018-08-06 DIAGNOSIS — E669 Obesity, unspecified: Secondary | ICD-10-CM | POA: Diagnosis not present

## 2018-08-06 MED ORDER — LEVOTHYROXINE SODIUM 25 MCG PO TABS: 25.0000 ug | ORAL_TABLET | Freq: Every day | ORAL | 0 refills | Status: DC

## 2018-08-07 LAB — T3: T3 TOTAL: 94 ng/dL (ref 71–180)

## 2018-08-07 LAB — TSH: TSH: 4.14 u[IU]/mL (ref 0.450–4.500)

## 2018-08-07 LAB — T4, FREE: FREE T4: 1.03 ng/dL (ref 0.82–1.77)

## 2018-08-07 NOTE — Progress Notes (Signed)
Office: [phone]  /  Fax: [phone]   HPI:   Chief Complaint: OBESITY Philip [name] is here to discuss his progress with his obesity treatment plan. He is on the Category 3 plan and is following his eating plan approximately 50 % of the time. He states he is going to the gym and walking for 60 minutes 7 times per week. Philip [name] has done well maintaining his weight even with increased traveling and eating out. He is still exercising regularly and playing golf.  His weight is 209 lb (94.8 kg) today and has gained 1 pound since his last visit. He has lost 21 lbs since starting treatment with [place].  Hypothyroidism Philip [name] has a diagnosis of hypothyroidism. He is on levothyroxine, but he has been out of his medication for 1 day. He denies hot or cold intolerance or palpitations.  ALLERGIES: No Known Allergies  MEDICATIONS: Current Outpatient Medications on File Prior to Visit  Medication Sig Dispense Refill  . amLODipine (NORVASC) 5 MG tablet Take 1 tablet (5 mg total) by mouth daily. 90 tablet 3  . atorvastatin (LIPITOR) 40 MG tablet TAKE 1 TABLET (40 MG TOTAL) BY MOUTH AT BEDTIME. 90 tablet 2  . B Complex-Biotin-FA (B-COMPLEX PO) Take 1 tablet by mouth daily.    Philip [name] Kitchen buPROPion (WELLBUTRIN SR) 150 MG 12 hr tablet Take 1 tablet (150 mg total) by mouth daily. 30 tablet 0  . Cholecalciferol (VITAMIN D3) 5000 UNITS CAPS Take 5,000 Units by mouth daily.    Philip [name] Kitchen co-enzyme Q-10 30 MG capsule Take 30 mg by mouth daily.    Philip [name] Kitchen ELIQUIS 5 MG TABS tablet TAKE 1 TABLET TWICE DAILY 180 tablet 1  . hydrocortisone 2.5 % cream Apply 1 application topically as needed.    Philip [name] Kitchen lisinopril (PRINIVIL,ZESTRIL) 10 MG tablet TAKE 1 TABLET EVERY DAY 90 tablet 3  . magnesium oxide (MAG-OX) 400 MG tablet Take 800 mg by mouth daily.    . Melatonin 10 MG CAPS Take 10 mg at bedtime by mouth.    . Multiple Vitamins-Minerals (MULTIVITAMIN WITH MINERALS) tablet Take 1 tablet by mouth daily.    . Omega-3 Fatty Acids (OMEGA-3 FISH OIL)  1200 MG CAPS Take 2 1,200 mg capsules daily 90 capsule 3  . polyethylene glycol (MIRALAX / GLYCOLAX) packet Take 17 g by mouth daily.     Current Facility-Administered Medications on File Prior to Visit  Medication Dose Route Frequency Provider Last Rate Last Dose  . triamcinolone acetonide (KENALOG) 10 MG/ML injection 10 mg  10 mg Other Once Trula Slade, DPM        PAST MEDICAL HISTORY: Past Medical History:  Diagnosis Date  . AAA (abdominal aortic aneurysm) ([place])   . Atrial fibrillation ([place])   . CAD (coronary artery disease)    had a MI , s/p CABG  . Cataracts, bilateral    immature  . CHF (congestive heart failure) ([place])   . Dysrhythmia    paroximal a fib  . GERD (gastroesophageal reflux disease)    takes Omeprazole daily  . History of colon polyps   . History of kidney stones   . History of shingles   . Hyperlipidemia   . Hypertension    takes Amlodipine,Metoprolol,and Lisinopril daily  . Joint pain   . Joint swelling   . Myocardial infarction [facility])    several with last one being 2001(but never knew about any except 2001)  . OSA (obstructive sleep apnea)    started CPAP 12/09  . Peripheral vascular  disease ([place])   . Pneumonia    as a child  . Skin cancer    several , one of them was melanoma (sees derm routinely)  . Tingling    feet occasionally  . Tinnitus     PAST SURGICAL HISTORY: Past Surgical History:  Procedure Laterality Date  . ABDOMINAL AORTAGRAM N/A 06/16/2014   Procedure: ABDOMINAL Maxcine Ham;  Surgeon: Angelia Mould, MD;  Location: [facility] CATH LAB;  Service: Cardiovascular;  Laterality: N/A;  . ABDOMINAL AORTIC ENDOVASCULAR STENT GRAFT N/A 07/08/2014   Procedure: ABDOMINAL AORTIC ENDOVASCULAR STENT GRAFT/ GORE;  Surgeon: Angelia Mould, MD;  Location: [place];  Service: Vascular;  Laterality: N/A;  . CARDIAC CATHETERIZATION  2015  . COLONOSCOPY    . CORONARY ARTERY BYPASS GRAFT  1999   x 3 vessels  . LEFT HEART CATHETERIZATION WITH  CORONARY/GRAFT ANGIOGRAM N/A 05/29/2014   Procedure: LEFT HEART CATHETERIZATION WITH [name];  Surgeon: [name], MD;  Location: [facility] CATH LAB;  Service: Cardiovascular;  Laterality: N/A;  . PENILE PROSTHESIS IMPLANT  08/2005  . RHINOPLASTY  1975    SOCIAL HISTORY: Social History   Tobacco Use  . Smoking status: Former Smoker    Types: Cigarettes    Last attempt to quit: 1979    Years since quitting: 40.7  . Smokeless tobacco: Never Used  . Tobacco comment: quit smoking in 1979  Substance Use Topics  . Alcohol use: Yes    Comment: occasionally - once per week  . Drug use: No    FAMILY HISTORY: Family History  Problem Relation Age of Onset  . Prostate cancer Father 1  . Heart disease Father   . Skin cancer Father   . Heart attack Father   . Cancer Father   . Deep vein thrombosis Father   . Hyperlipidemia Father   . Hypertension Father   . Heart disease Mother   . Skin cancer Mother   . Heart attack Mother   . Cancer Mother   . Hyperlipidemia Mother   . Hypertension Mother   . Varicose Veins Mother   . Peripheral vascular disease Mother        amputation  . Sleep apnea Brother   . Hyperlipidemia Brother   . Hypertension Brother   . Prostate cancer Brother 33  . Colon cancer Other        aunt  . Skin cancer Brother   . Skin cancer Sister   . Cancer Sister   . Heart disease Sister   . Diabetes Neg Hx   . Stroke Neg Hx     ROS: Review of Systems  Constitutional: Negative for weight loss.  Cardiovascular: Negative for palpitations.  Endo/Heme/Allergies:       Negative hot/cold intolerance    PHYSICAL EXAM: Blood pressure (!) 144/68, pulse 69, temperature 98 F (36.7 C), temperature source Oral, height 5\' 9"  (1.753 m), weight 209 lb (94.8 kg), SpO2 98 %. Body mass index is 30.86 kg/m. Physical Exam  Constitutional: He is oriented to person, place, and time. He appears well-developed and well-nourished.  Cardiovascular: Normal rate.   Pulmonary/Chest: Effort normal.  Musculoskeletal: Normal range of motion.  Neurological: He is oriented to person, place, and time.  Skin: Skin is warm and dry.  Psychiatric: He has a normal mood and affect. His behavior is normal.  Vitals reviewed.   RECENT LABS AND TESTS: BMET    Component Value Date/Time   NA 142 06/06/2018 0000   K 5.0  06/06/2018 0000   CL 102 06/06/2018 0000   CO2 25 06/06/2018 0000   GLUCOSE 97 06/06/2018 0000   GLUCOSE 99 01/01/2018 1017   BUN 25 06/06/2018 0000   CREATININE 1.28 (H) 06/06/2018 0000   CREATININE 1.08 12/14/2015 1122   CALCIUM 9.7 06/06/2018 0000   GFRNONAA 53 (L) 06/06/2018 0000   GFRAA 62 06/06/2018 0000   Lab Results  Component Value Date   HGBA1C 5.8 (H) 06/06/2018   HGBA1C 5.6 08/14/2017   HGBA1C 5.7 (H) 04/19/2017   HGBA1C 5.9 (H) 01/18/2017   HGBA1C 6.1 12/07/2016   Lab Results  Component Value Date   INSULIN 9.6 06/06/2018   INSULIN 10.8 08/14/2017   INSULIN 17.3 04/19/2017   INSULIN 16.1 01/18/2017   CBC    Component Value Date/Time   WBC 4.4 06/06/2018 0000   WBC 4.6 01/01/2018 1017   RBC 4.55 06/06/2018 0000   RBC 4.77 01/01/2018 1017   HGB 14.6 06/06/2018 0000   HCT 43.0 06/06/2018 0000   PLT 147 (L) 06/06/2018 0000   MCV 95 06/06/2018 0000   MCH 32.1 06/06/2018 0000   MCH 31.1 12/14/2015 1122   MCHC 34.0 06/06/2018 0000   MCHC 34.1 01/01/2018 1017   RDW 14.2 06/06/2018 0000   LYMPHSABS 1.4 06/06/2018 0000   MONOABS 0.4 01/01/2018 1017   EOSABS 0.1 06/06/2018 0000   BASOSABS 0.0 06/06/2018 0000   Iron/TIBC/Ferritin/ %Sat No results found for: IRON, TIBC, FERRITIN, IRONPCTSAT Lipid Panel     Component Value Date/Time   CHOL 112 06/06/2018 0000   TRIG 84 06/06/2018 0000   TRIG 256 09/14/2009 0000   HDL 46 06/06/2018 0000   CHOLHDL 3 12/07/2016 0852   VLDL 30.8 12/07/2016 0852   LDLCALC 49 06/06/2018 0000   LDLDIRECT 68.0 04/13/2015 0823   Hepatic Function Panel     Component Value  Date/Time   PROT 6.6 06/06/2018 0000   ALBUMIN 4.7 06/06/2018 0000   AST 24 06/06/2018 0000   ALT 21 06/06/2018 0000   ALKPHOS 50 06/06/2018 0000   BILITOT 0.6 06/06/2018 0000      Component Value Date/Time   TSH 4.140 08/06/2018 1148   TSH 4.010 01/31/2018 1158   TSH 2.430 10/19/2017 0906    ASSESSMENT AND PLAN: Other specified hypothyroidism - Plan: T3, T4, free, TSH, levothyroxine (SYNTHROID, LEVOTHROID) 25 MCG tablet  Class 1 obesity with serious comorbidity and body mass index (BMI) of 31.0 to 31.9 in adult, unspecified obesity type  PLAN:  Hypothyroidism Philip [name] was informed of the importance of good thyroid control to help with weight loss efforts. He was also informed that supertheraputic thyroid levels are dangerous and will not improve weight loss results. Tracer agrees to continue taking levothyroxine 25 mcg q AM #30 and we will refill for 1 month. We will check labs today and Philip [name] agrees to follow up with our clinic in 4 weeks.  Obesity Philip [name] is currently in the action stage of change. As such, his goal is to continue with weight loss efforts He has agreed to follow the Category 3 plan Philip [name] has been instructed to work up to a goal of 150 minutes of combined cardio and strengthening exercise per week for weight loss and overall health benefits. We discussed the following Behavioral Modification Strategies today: increasing lean protein intake, decreasing simple carbohydrates , decrease eating out and work on meal planning and easy cooking plans   Philip [name] has agreed to follow up with our clinic in 4 weeks. He  was informed of the importance of frequent follow up visits to maximize his success with intensive lifestyle modifications for his multiple health conditions.   OBESITY BEHAVIORAL INTERVENTION VISIT  Today's visit was # 29   Starting weight: 230 lbs Starting date: 01/18/17 Today's weight : 209 lbs  Today's date: 08/06/2018 Total lbs lost to date: 21 At  least 15 minutes were spent on discussing the following behavioral intervention visit.   ASK: We discussed the diagnosis of obesity with Philip [name] today and Philip [name] agreed to give [place] permission to discuss obesity behavioral modification therapy today.  ASSESS: Philip [name] has the diagnosis of obesity and his BMI today is 30.85 Philip [name] is in the action stage of change   ADVISE: Philip [name] was educated on the multiple health risks of obesity as well as the benefit of weight loss to improve his health. He was advised of the need for long term treatment and the importance of lifestyle modifications to improve his current health and to decrease his risk of future health problems.  AGREE: Multiple dietary modification options and treatment options were discussed and  Philip [name] agreed to follow the recommendations documented in the above note.  ARRANGE: Philip [name] was educated on the importance of frequent visits to treat obesity as outlined per CMS and USPSTF guidelines and agreed to schedule his next follow up appointment today.  I, Trixie Dredge, am acting as transcriptionist for [name], MD  I have reviewed the above documentation for accuracy and completeness, and I agree with the above. -[name], MD

## 2018-08-15 ENCOUNTER — Other Ambulatory Visit: Payer: Self-pay | Admitting: Internal Medicine

## 2018-08-15 DIAGNOSIS — I2510 Atherosclerotic heart disease of native coronary artery without angina pectoris: Secondary | ICD-10-CM

## 2018-08-16 NOTE — Telephone Encounter (Signed)
Please review for refill.  

## 2018-08-29 ENCOUNTER — Ambulatory Visit (INDEPENDENT_AMBULATORY_CARE_PROVIDER_SITE_OTHER): Payer: Medicare HMO | Admitting: Family Medicine

## 2018-08-29 VITALS — BP 145/80 | HR 69 | Temp 97.6°F | Ht 69.0 in | Wt 208.0 lb

## 2018-08-29 DIAGNOSIS — F3289 Other specified depressive episodes: Secondary | ICD-10-CM | POA: Diagnosis not present

## 2018-08-29 DIAGNOSIS — Z6830 Body mass index (BMI) 30.0-30.9, adult: Secondary | ICD-10-CM | POA: Diagnosis not present

## 2018-08-29 DIAGNOSIS — E669 Obesity, unspecified: Secondary | ICD-10-CM

## 2018-08-29 DIAGNOSIS — I10 Essential (primary) hypertension: Secondary | ICD-10-CM | POA: Diagnosis not present

## 2018-08-29 MED ORDER — BUPROPION HCL ER (SR) 150 MG PO TB12: 150.0000 mg | ORAL_TABLET | Freq: Every day | ORAL | 0 refills | Status: DC

## 2018-08-29 NOTE — Progress Notes (Signed)
Office: [phone]  /  Fax: [phone]   HPI:   Chief Complaint: OBESITY Philip [name] is here to discuss his progress with his obesity treatment plan. He is on the  follow the Category 2 plan and is following his eating plan approximately 50 % of the time. He states he is exercising 60 minutes 7 times per week. Philip [name] continues to do well with maintaining his weight. He is exercising regularly and enjoying playing golf. His hunger is controlled.   His weight is 208 lb (94.3 kg) today and [name] had a weight loss of 1 pounds over a period of 2 weeks since his last visit. He [name] lost 22 lbs since starting treatment with [place].  Hypertension Philip [name] is a 77 y.o. male with hypertension.  Philip [name] denies chest pain or shortness of breath on exertion. He is working weight loss to help control his blood pressure with the goal of decreasing his risk of heart attack and stroke. Philip [name] blood pressure is not currently controlled. It is elevated today, patient [name] been spacing his blood pressure medicine every four hours after his levothyroxine and his las three blood pressures have been elevated.   Depression with emotional eating behaviors Philip [name] is struggling with emotional eating and using food for comfort to the extent that it is negatively impacting his health. He often snacks when he is not hungry. Philip [name] sometimes feels he is out of control and then feels guilty that he made poor food choices. He [name] been working on behavior modification techniques to help reduce his emotional eating and [name] been very successful. He shows no sign of suicidal or homicidal ideations.  Depression screen [name] 2/9 01/18/2017 12/06/2016 12/21/2015 06/19/2015 04/22/2014  Decreased Interest 3 1 0 0 0  Down, Depressed, Hopeless 1 0 0 0 0  PHQ - 2 Score 4 1 0 0 0  Altered sleeping 2 - - - -  Tired, decreased energy 2 - - - -  Change in appetite 1 - - - -  Feeling bad or failure about yourself  2 - - - -  Trouble  concentrating 1 - - - -  Moving slowly or fidgety/restless 1 - - - -  Suicidal thoughts 0 - - - -  PHQ-9 Score 13 - - - -      ALLERGIES: No Known Allergies  MEDICATIONS: Current Outpatient Medications on File Prior to Visit  Medication Sig Dispense Refill  . amLODipine (NORVASC) 5 MG tablet Take 1 tablet (5 mg total) by mouth daily. 90 tablet 3  . atorvastatin (LIPITOR) 40 MG tablet TAKE 1 TABLET (40 MG TOTAL) BY MOUTH AT BEDTIME. 90 tablet 2  . B Complex-Biotin-FA (B-COMPLEX PO) Take 1 tablet by mouth daily.    . Cholecalciferol (VITAMIN D3) 5000 UNITS CAPS Take 5,000 Units by mouth daily.    Marland Kitchen co-enzyme Q-10 30 MG capsule Take 30 mg by mouth daily.    Marland Kitchen ELIQUIS 5 MG TABS tablet TAKE 1 TABLET TWICE DAILY 180 tablet 1  . hydrocortisone 2.5 % cream Apply 1 application topically as needed.    Marland Kitchen levothyroxine (SYNTHROID, LEVOTHROID) 25 MCG tablet Take 1 tablet (25 mcg total) by mouth daily before breakfast. 30 tablet 0  . lisinopril (PRINIVIL,ZESTRIL) 10 MG tablet TAKE 1 TABLET EVERY DAY 90 tablet 3  . magnesium oxide (MAG-OX) 400 MG tablet Take 800 mg by mouth daily.    . Melatonin 10 MG CAPS Take 10 mg at bedtime by mouth.    Marland Kitchen  Multiple Vitamins-Minerals (MULTIVITAMIN WITH MINERALS) tablet Take 1 tablet by mouth daily.    . Omega-3 Fatty Acids (OMEGA-3 FISH OIL) 1200 MG CAPS Take 2 1,200 mg capsules daily 90 capsule 3  . polyethylene glycol (MIRALAX / GLYCOLAX) packet Take 17 g by mouth daily.     Current Facility-Administered Medications on File Prior to Visit  Medication Dose Route Frequency Provider Last Rate Last Dose  . triamcinolone acetonide (KENALOG) 10 MG/ML injection 10 mg  10 mg Other Once Trula Slade, DPM        PAST MEDICAL HISTORY: Past Medical History:  Diagnosis Date  . AAA (abdominal aortic aneurysm) ([place])   . Atrial fibrillation ([place])   . CAD (coronary artery disease)    had a MI , s/p CABG  . Cataracts, bilateral    immature  . CHF (congestive  heart failure) ([place])   . Dysrhythmia    paroximal a fib  . GERD (gastroesophageal reflux disease)    takes Omeprazole daily  . History of colon polyps   . History of kidney stones   . History of shingles   . Hyperlipidemia   . Hypertension    takes Amlodipine,Metoprolol,and Lisinopril daily  . Joint pain   . Joint swelling   . Myocardial infarction [name])    several with last one being 2001(but never knew about any except 2001)  . OSA (obstructive sleep apnea)    started CPAP 12/09  . Peripheral vascular disease ([place])   . Pneumonia    as a child  . Skin cancer    several , one of them was melanoma (sees derm routinely)  . Tingling    feet occasionally  . Tinnitus     PAST SURGICAL HISTORY: Past Surgical History:  Procedure Laterality Date  . ABDOMINAL AORTAGRAM N/A 06/16/2014   Procedure: ABDOMINAL Maxcine Ham;  Surgeon: Angelia Mould, MD;  Location: [facility] CATH LAB;  Service: Cardiovascular;  Laterality: N/A;  . ABDOMINAL AORTIC ENDOVASCULAR STENT GRAFT N/A 07/08/2014   Procedure: ABDOMINAL AORTIC ENDOVASCULAR STENT GRAFT/ GORE;  Surgeon: Angelia Mould, MD;  Location: [place];  Service: Vascular;  Laterality: N/A;  . CARDIAC CATHETERIZATION  2015  . COLONOSCOPY    . CORONARY ARTERY BYPASS GRAFT  1999   x 3 vessels  . LEFT HEART CATHETERIZATION WITH CORONARY/GRAFT ANGIOGRAM N/A 05/29/2014   Procedure: LEFT HEART CATHETERIZATION WITH [name];  Surgeon: [name], MD;  Location[facility] LAB;  Service: Cardiovascular;  Laterality: N/A;  . PENILE PROSTHESIS IMPLANT  08/2005  . RHINOPLASTY  1975    SOCIAL HISTORY: Social History   Tobacco Use  . Smoking status: Former Smoker    Types: Cigarettes    Last attempt to quit: 1979    Years since quitting: 40.8  . Smokeless tobacco: Never Used  . Tobacco comment: quit smoking in 1979  Substance Use Topics  . Alcohol use: Yes    Comment: occasionally - once per week  . Drug use: No    FAMILY  HISTORY: Family History  Problem Relation Age of Onset  . Prostate cancer Father 69  . Heart disease Father   . Skin cancer Father   . Heart attack Father   . Cancer Father   . Deep vein thrombosis Father   . Hyperlipidemia Father   . Hypertension Father   . Heart disease Mother   . Skin cancer Mother   . Heart attack Mother   . Cancer Mother   . Hyperlipidemia  Mother   . Hypertension Mother   . Varicose Veins Mother   . Peripheral vascular disease Mother        amputation  . Sleep apnea Brother   . Hyperlipidemia Brother   . Hypertension Brother   . Prostate cancer Brother 30  . Colon cancer Other        aunt  . Skin cancer Brother   . Skin cancer Sister   . Cancer Sister   . Heart disease Sister   . Diabetes Neg Hx   . Stroke Neg Hx     ROS: Review of Systems  Constitutional: Positive for weight loss.  All other systems reviewed and are negative.   PHYSICAL EXAM: Blood pressure (!) 145/80, pulse 69, temperature 97.6 F (36.4 C), temperature source Oral, height 5\' 9"  (1.753 m), weight 208 lb (94.3 kg), SpO2 98 %. Body mass index is 30.72 kg/m. Physical Exam  Constitutional: He is oriented to person, place, and time. He appears well-developed and well-nourished.  HENT:  Head: Normocephalic.  Eyes: Pupils are equal, round, and reactive to light.  Neck: Normal range of motion.  Pulmonary/Chest: Effort normal.  Musculoskeletal: Normal range of motion.  Neurological: He is alert and oriented to person, place, and time.  Skin: Skin is warm and dry.  Psychiatric: He [name] a normal mood and affect. His behavior is normal.  Vitals reviewed.   RECENT LABS AND TESTS: BMET    Component Value Date/Time   NA 142 06/06/2018 0000   K 5.0 06/06/2018 0000   CL 102 06/06/2018 0000   CO2 25 06/06/2018 0000   GLUCOSE 97 06/06/2018 0000   GLUCOSE 99 01/01/2018 1017   BUN 25 06/06/2018 0000   CREATININE 1.28 (H) 06/06/2018 0000   CREATININE 1.08 12/14/2015 1122    CALCIUM 9.7 06/06/2018 0000   GFRNONAA 53 (L) 06/06/2018 0000   GFRAA 62 06/06/2018 0000   Lab Results  Component Value Date   HGBA1C 5.8 (H) 06/06/2018   HGBA1C 5.6 08/14/2017   HGBA1C 5.7 (H) 04/19/2017   HGBA1C 5.9 (H) 01/18/2017   HGBA1C 6.1 12/07/2016   Lab Results  Component Value Date   INSULIN 9.6 06/06/2018   INSULIN 10.8 08/14/2017   INSULIN 17.3 04/19/2017   INSULIN 16.1 01/18/2017   CBC    Component Value Date/Time   WBC 4.4 06/06/2018 0000   WBC 4.6 01/01/2018 1017   RBC 4.55 06/06/2018 0000   RBC 4.77 01/01/2018 1017   HGB 14.6 06/06/2018 0000   HCT 43.0 06/06/2018 0000   PLT 147 (L) 06/06/2018 0000   MCV 95 06/06/2018 0000   MCH 32.1 06/06/2018 0000   MCH 31.1 12/14/2015 1122   MCHC 34.0 06/06/2018 0000   MCHC 34.1 01/01/2018 1017   RDW 14.2 06/06/2018 0000   LYMPHSABS 1.4 06/06/2018 0000   MONOABS 0.4 01/01/2018 1017   EOSABS 0.1 06/06/2018 0000   BASOSABS 0.0 06/06/2018 0000   Iron/TIBC/Ferritin/ %Sat No results found for: IRON, TIBC, FERRITIN, IRONPCTSAT Lipid Panel     Component Value Date/Time   CHOL 112 06/06/2018 0000   TRIG 84 06/06/2018 0000   TRIG 256 09/14/2009 0000   HDL 46 06/06/2018 0000   CHOLHDL 3 12/07/2016 0852   VLDL 30.8 12/07/2016 0852   LDLCALC 49 06/06/2018 0000   LDLDIRECT 68.0 04/13/2015 0823   Hepatic Function Panel     Component Value Date/Time   PROT 6.6 06/06/2018 0000   ALBUMIN 4.7 06/06/2018 0000   AST 24 06/06/2018  0000   ALT 21 06/06/2018 0000   ALKPHOS 50 06/06/2018 0000   BILITOT 0.6 06/06/2018 0000      Component Value Date/Time   TSH 4.140 08/06/2018 1148   TSH 4.010 01/31/2018 1158   TSH 2.430 10/19/2017 0906    ASSESSMENT AND PLAN: Essential hypertension  Other depression - with emotional eating  - Plan: buPROPion (WELLBUTRIN SR) 150 MG 12 hr tablet  Class 1 obesity with serious comorbidity and body mass index (BMI) of 30.0 to 30.9 in adult, unspecified obesity  type  PLAN: Hypertension We discussed sodium restriction, working on healthy weight loss, and a regular exercise program as the means to achieve improved blood pressure control. Philip [name] agreed with this plan and agreed to follow up as directed. We will continue to monitor his blood pressure as well as his progress with the above lifestyle modifications. He will continue his medications as prescribed and will watch for signs of hypotension as he continues his lifestyle modifications. Patient is adamant to change his blood pressure to one hour after levothyroxine and will continue to monitor both blood pressure and times.   Depression with Emotional Eating Behaviors We discussed behavior modification techniques today to help Philip [name] with his emotional eating and depression. He [name] agreed to continue  Wellbutrin SR 150 mg qd and agreed to follow up as directed and a 30 day supply was written today.   Obesity Philip [name] is currently in the action stage of change. As such, his goal is to continue with weight loss efforts He [name] agreed to follow the Category 2 plan daily.  Philip [name] been instructed to work up to a goal of 150 minutes of combined cardio and strengthening exercise per week for weight loss and overall health benefits. We discussed the following Behavioral Modification Stratagies today: increasing lean protein intake, decreasing simple carbohydrates , work on meal planning and easy cooking plans and avoiding temptations and holiday eating strategies.    Philip [name] [name] agreed to follow up with our clinic in 3 weeks. He was informed of the importance of frequent follow up visits to maximize his success with intensive lifestyle modifications for his multiple health conditions.   OBESITY BEHAVIORAL INTERVENTION VISIT  Today's visit was # 30  Starting weight: 230 lbs Starting date: 01/18/17 Today's weight : Weight: 208 lb (94.3 kg)  Today's date: 08/29/2018 Total lbs lost to date: 22 15  minutes were spent on OBH counselling   ASK: We discussed the diagnosis of obesity with Philip [name] today and Philip [name] agreed to give [place] permission to discuss obesity behavioral modification therapy today.  ASSESS: Philip [name] [name] the diagnosis of obesity and his BMI today is 30.8 Philip [name] is in the action stage of change   ADVISE: Philip [name] was educated on the multiple health risks of obesity as well as the benefit of weight loss to improve his health. He was advised of the need for long term treatment and the importance of lifestyle modifications to improve his current health and to decrease his risk of future health problems.  AGREE: Multiple dietary modification options and treatment options were discussed and  Philip [name] agreed to follow the recommendations documented in the above note.  ARRANGE: Philip [name] was educated on the importance of frequent visits to treat obesity as outlined per CMS and USPSTF guidelines and agreed to schedule his next follow up appointment today.  I, Philip [name], am acting as Location manager for Dr Philip [name].   I have reviewed the above documentation for  accuracy and completeness, and I agree with the above. -Philip Nip, MD

## 2018-08-30 ENCOUNTER — Encounter (INDEPENDENT_AMBULATORY_CARE_PROVIDER_SITE_OTHER): Payer: Self-pay | Admitting: Family Medicine

## 2018-09-03 ENCOUNTER — Encounter: Payer: Self-pay | Admitting: Internal Medicine

## 2018-09-03 ENCOUNTER — Ambulatory Visit (INDEPENDENT_AMBULATORY_CARE_PROVIDER_SITE_OTHER): Payer: Medicare HMO | Admitting: Internal Medicine

## 2018-09-03 VITALS — BP 126/72 | HR 71 | Temp 98.2°F | Resp 16 | Ht 69.0 in | Wt 216.4 lb

## 2018-09-03 DIAGNOSIS — R739 Hyperglycemia, unspecified: Secondary | ICD-10-CM

## 2018-09-03 DIAGNOSIS — Z23 Encounter for immunization: Secondary | ICD-10-CM | POA: Diagnosis not present

## 2018-09-03 DIAGNOSIS — G4733 Obstructive sleep apnea (adult) (pediatric): Secondary | ICD-10-CM

## 2018-09-03 DIAGNOSIS — I10 Essential (primary) hypertension: Secondary | ICD-10-CM | POA: Diagnosis not present

## 2018-09-03 MED ORDER — ZOSTER VAC RECOMB ADJUVANTED 50 MCG/0.5ML IM SUSR: 0.5000 mL | Freq: Once | INTRAMUSCULAR | 1 refills | Status: AC

## 2018-09-03 NOTE — Patient Instructions (Signed)
  GO TO THE FRONT DESK Schedule your next appointment for a  Physical exam in 6 months  

## 2018-09-03 NOTE — Assessment & Plan Note (Signed)
Hyperglycemia: Doing great with lifestyle, he is very appreciative of [name].  Last A1c 5.8. HTN: Currently on amlodipine, lisinopril; metoprolol was stopped due to bradycardia.  BP remains controlled Hyperlipidemia: On Lipitor, recently fenofibrate was discontinued by cardiology, has a follow-up lab with them soon. OSA: Patient thinking about repeat a sleep study to see if he still needs a CPAP. Last creatinine 1.28, slightly up, not taking NSAIDs, recheck in few months. Preventive care: Flu shot today, pro/cons of Shingrix discussed, prescription printed RTC 6 m, CPX.

## 2018-09-03 NOTE — Progress Notes (Signed)
Pre visit review using our clinic review tool, if applicable. No additional management support is needed unless otherwise documented below in the visit note. 

## 2018-09-03 NOTE — Progress Notes (Signed)
Subjective:    Patient ID: Philip [name], male    DOB: 1941/08/27, 77 y.o.   MRN: [id number]  DOS:  09/03/2018 Type of visit - description : rov Interval history: Since last OV he continues to feel well. He remains very active, has lost some weight, states "I feel better now  than in many years". HTN: Off metoprolol due to bradycardia.  BP is very good Cardiology note reviewed   Review of Systems Denies chest pain no difficulty breathing No difficulty urinating, dysuria or gross hematuria. Past Medical History:  Diagnosis Date  . AAA (abdominal aortic aneurysm) ([place])   . Atrial fibrillation ([place])   . CAD (coronary artery disease)    had a MI , s/p CABG  . Cataracts, bilateral    immature  . CHF (congestive heart failure) ([place])   . Dysrhythmia    paroximal a fib  . GERD (gastroesophageal reflux disease)    takes Omeprazole daily  . History of colon polyps   . History of kidney stones   . History of shingles   . Hyperlipidemia   . Hypertension    takes Amlodipine,Metoprolol,and Lisinopril daily  . Joint pain   . Joint swelling   . Myocardial infarction [facility])    several with last one being 2001(but never knew about any except 2001)  . OSA (obstructive sleep apnea)    started CPAP 12/09  . Peripheral vascular disease ([place])   . Pneumonia    as a child  . Skin cancer    several , one of them was melanoma (sees derm routinely)  . Tingling    feet occasionally  . Tinnitus     Past Surgical History:  Procedure Laterality Date  . ABDOMINAL AORTAGRAM N/A 06/16/2014   Procedure: ABDOMINAL Maxcine Ham;  Surgeon: Angelia Mould, MD;  Location: [facility] CATH LAB;  Service: Cardiovascular;  Laterality: N/A;  . ABDOMINAL AORTIC ENDOVASCULAR STENT GRAFT N/A 07/08/2014   Procedure: ABDOMINAL AORTIC ENDOVASCULAR STENT GRAFT/ GORE;  Surgeon: Angelia Mould, MD;  Location: [place];  Service: Vascular;  Laterality: N/A;  . CARDIAC CATHETERIZATION  2015  . COLONOSCOPY    .  CORONARY ARTERY BYPASS GRAFT  1999   x 3 vessels  . LEFT HEART CATHETERIZATION WITH CORONARY/GRAFT ANGIOGRAM N/A 05/29/2014   Procedure: LEFT HEART CATHETERIZATION WITH [name];  Surgeon: [name], MD;  Location: [facility] CATH LAB;  Service: Cardiovascular;  Laterality: N/A;  . PENILE PROSTHESIS IMPLANT  08/2005  . RHINOPLASTY  1975    Social History   Socioeconomic History  . Marital status: Married    Spouse name: Not on file  . Number of children: 3  . Years of education: 27  . Highest education level: Not on file  Occupational History  . Occupation: retired, Actor  Social Needs  . Financial resource strain: Not on file  . Food insecurity:    Worry: Not on file    Inability: Not on file  . Transportation needs:    Medical: Not on file    Non-medical: Not on file  Tobacco Use  . Smoking status: Former Smoker    Types: Cigarettes    Last attempt to quit: 1979    Years since quitting: 40.8  . Smokeless tobacco: Never Used  . Tobacco comment: quit smoking in 1979  Substance and Sexual Activity  . Alcohol use: Yes    Comment: occasionally - once per week  . Drug use: No  . Sexual activity:  Not Currently  Lifestyle  . Physical activity:    Days per week: Not on file    Minutes per session: Not on file  . Stress: Not on file  Relationships  . Social connections:    Talks on phone: Not on file    Gets together: Not on file    Attends religious service: Not on file    Active member of club or organization: Not on file    Attends meetings of clubs or organizations: Not on file    Relationship status: Not on file  . Intimate partner violence:    Fear of current or ex partner: Not on file    Emotionally abused: Not on file    Physically abused: Not on file    Forced sexual activity: Not on file  Other Topics Concern  . Not on file  Social History Narrative   Lives w/ wife in a 2 story home.  Has one son and 2 stepchildren.  Retired for [employer].      Education: high school.      Allergies as of 09/03/2018   No Known Allergies     Medication List        Accurate as of 09/03/18 12:53 PM. Always use your most recent med list.          amLODipine 5 MG tablet Commonly known as:  NORVASC Take 1 tablet (5 mg total) by mouth daily.   atorvastatin 40 MG tablet Commonly known as:  LIPITOR TAKE 1 TABLET (40 MG TOTAL) BY MOUTH AT BEDTIME.   B-COMPLEX PO Take 1 tablet by mouth daily.   buPROPion 150 MG 12 hr tablet Commonly known as:  WELLBUTRIN SR Take 1 tablet (150 mg total) by mouth daily.   co-enzyme Q-10 30 MG capsule Take 30 mg by mouth daily.   ELIQUIS 5 MG Tabs tablet Generic drug:  apixaban TAKE 1 TABLET TWICE DAILY   hydrocortisone 2.5 % cream Apply 1 application topically as needed.   levothyroxine 25 MCG tablet Commonly known as:  SYNTHROID, LEVOTHROID Take 1 tablet (25 mcg total) by mouth daily before breakfast.   lisinopril 10 MG tablet Commonly known as:  PRINIVIL,ZESTRIL TAKE 1 TABLET EVERY DAY   magnesium oxide 400 MG tablet Commonly known as:  MAG-OX Take 800 mg by mouth daily.   Melatonin 10 MG Caps Take 10 mg at bedtime by mouth.   multivitamin with minerals tablet Take 1 tablet by mouth daily.   Omega-3 Fish Oil 1200 MG Caps Take 2 1,200 mg capsules daily   polyethylene glycol packet Commonly known as:  MIRALAX / GLYCOLAX Take 17 g by mouth daily.   Vitamin D3 5000 units Caps Take 5,000 Units by mouth daily.   Zoster Vaccine Adjuvanted injection Commonly known as:  SHINGRIX Inject 0.5 mLs into the muscle once for 1 dose.          Objective:   Physical Exam BP 126/72 (BP Location: Left Arm, Patient Position: Sitting, Cuff Size: Small)   Pulse 71   Temp 98.2 F (36.8 C) (Oral)   Resp 16   Ht 5\' 9"  (1.753 m)   Wt 216 lb 6 oz (98.1 kg)   SpO2 95%   BMI 31.95 kg/m  General:   Well developed, NAD, BMI noted. HEENT:  Normocephalic . Face symmetric, atraumatic Lungs:    CTA B Normal respiratory effort, no intercostal retractions, no accessory muscle use. Heart: Irregularly irregular No pitting pretibial edema bilaterally  Skin: Not  pale. Not jaundice Neurologic:  alert & oriented X3.  Speech normal, gait appropriate for age and unassisted Psych--  Cognition and judgment appear intact.  Cooperative with normal attention span and concentration.  Behavior appropriate. No anxious or depressed appearing.      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 Fib, paroxysmal  GERD  Chronic constipation OSA- Cpap (Dr [name]) ED- penile implant H/o skin cancer, Dr. [name] H/o Kidney stones 2016  PLAN:  Hyperglycemia: Doing great with lifestyle, he is very appreciative of [name].  Last A1c 5.8. HTN: Currently on amlodipine, lisinopril; metoprolol was stopped due to bradycardia.  BP remains controlled Hyperlipidemia: On Lipitor, recently fenofibrate was discontinued by cardiology, has a follow-up lab with them soon. OSA: Patient thinking about repeat a sleep study to see if he still needs a CPAP. Last creatinine 1.28, slightly up, not taking NSAIDs, recheck in few months. Preventive care: Flu shot today, pro/cons of Shingrix discussed, prescription printed RTC 6 m, CPX.

## 2018-09-05 ENCOUNTER — Other Ambulatory Visit (INDEPENDENT_AMBULATORY_CARE_PROVIDER_SITE_OTHER): Payer: Self-pay

## 2018-09-05 ENCOUNTER — Telehealth (INDEPENDENT_AMBULATORY_CARE_PROVIDER_SITE_OTHER): Payer: Self-pay | Admitting: Family Medicine

## 2018-09-05 DIAGNOSIS — E038 Other specified hypothyroidism: Secondary | ICD-10-CM

## 2018-09-05 MED ORDER — LEVOTHYROXINE SODIUM 25 MCG PO TABS: 25.0000 ug | ORAL_TABLET | Freq: Every day | ORAL | 0 refills | Status: DC

## 2018-09-05 NOTE — Telephone Encounter (Signed)
Patient prescription sent in to the pharmacy. Berna Gitto, CMA

## 2018-09-05 NOTE — Telephone Encounter (Signed)
Patient called stated he is totally out of RX levothyroxine. Patient has called pharmacy prior. [place].  Thanks! Malachy Mood

## 2018-09-12 ENCOUNTER — Other Ambulatory Visit: Payer: Medicare HMO | Admitting: *Deleted

## 2018-09-12 DIAGNOSIS — E782 Mixed hyperlipidemia: Secondary | ICD-10-CM

## 2018-09-12 DIAGNOSIS — I480 Paroxysmal atrial fibrillation: Secondary | ICD-10-CM

## 2018-09-12 LAB — LIPID PANEL
CHOLESTEROL TOTAL: 124 mg/dL (ref 100–199)
Chol/HDL Ratio: 2.9 ratio (ref 0.0–5.0)
HDL: 43 mg/dL (ref >39)
LDL Calculated: 54 mg/dL (ref 0–99)
TRIGLYCERIDES: 137 mg/dL (ref 0–149)
VLDL CHOLESTEROL CAL: 27 mg/dL (ref 5–40)

## 2018-09-19 ENCOUNTER — Ambulatory Visit (INDEPENDENT_AMBULATORY_CARE_PROVIDER_SITE_OTHER): Payer: Medicare HMO | Admitting: Family Medicine

## 2018-09-19 VITALS — BP 136/74 | HR 70 | Temp 97.7°F | Ht 69.0 in | Wt 209.0 lb

## 2018-09-19 DIAGNOSIS — Z6830 Body mass index (BMI) 30.0-30.9, adult: Secondary | ICD-10-CM

## 2018-09-19 DIAGNOSIS — G252 Other specified forms of tremor: Secondary | ICD-10-CM | POA: Diagnosis not present

## 2018-09-19 DIAGNOSIS — F3289 Other specified depressive episodes: Secondary | ICD-10-CM

## 2018-09-19 DIAGNOSIS — E038 Other specified hypothyroidism: Secondary | ICD-10-CM

## 2018-09-19 DIAGNOSIS — E669 Obesity, unspecified: Secondary | ICD-10-CM | POA: Diagnosis not present

## 2018-09-19 MED ORDER — LEVOTHYROXINE SODIUM 25 MCG PO TABS: 25.0000 ug | ORAL_TABLET | Freq: Every day | ORAL | 0 refills | Status: DC

## 2018-09-19 MED ORDER — BUPROPION HCL ER (SR) 150 MG PO TB12: 150.0000 mg | ORAL_TABLET | Freq: Every day | ORAL | 0 refills | Status: DC

## 2018-09-20 NOTE — Progress Notes (Signed)
Office: [phone]  /  Fax: [phone]   HPI:   Chief Complaint: OBESITY Philip [name] is here to discuss his progress with his obesity treatment plan. He is on the Category 2 plan and is following his eating plan approximately 50 % of the time. He states he is at the gym and walking for 60 minutes 7 times per week. Rc has done well with maintaining his weight loss in the last month. He is exercising more and working on core strengthening. His weight is 209 lb (94.8 kg) today and has gained 1 pound since his last visit. He has lost 21 lbs since starting treatment with [place].  Hypothyroidism Philip [name] has a diagnosis of hypothyroidism. He is stable on medications. He denies hot or cold intolerance or palpitations. His recent labs are within normal limits.  Action Tremor Philip [name] has action tremor in right thumb and no present at rest, but noticeable with writing. He is worried that this may be Parkinson's starting.  Depression with emotional eating behaviors Philip [name] 's mood is stable on Wellbutrin and he is doing well with decreasing emotional eating. Philip [name] struggles with emotional eating and using food for comfort to the extent that it is negatively impacting his health. He often snacks when he is not hungry. Philip [name] sometimes feels he is out of control and then feels guilty that he made poor food choices. He has been working on behavior modification techniques to help reduce his emotional eating and has been somewhat successful. He shows no sign of suicidal or homicidal ideations.  Depression screen Philip [name] 2/9 09/03/2018 01/18/2017 12/06/2016 12/21/2015 06/19/2015  Decreased Interest 0 3 1 0 0  Down, Depressed, Hopeless 0 1 0 0 0  PHQ - 2 Score 0 4 1 0 0  Altered sleeping - 2 - - -  Tired, decreased energy - 2 - - -  Change in appetite - 1 - - -  Feeling bad or failure about yourself  - 2 - - -  Trouble concentrating - 1 - - -  Moving slowly or fidgety/restless - 1 - - -  Suicidal thoughts - 0 - - -   PHQ-9 Score - 13 - - -    ALLERGIES: No Known Allergies  MEDICATIONS: Current Outpatient Medications on File Prior to Visit  Medication Sig Dispense Refill  . amLODipine (NORVASC) 5 MG tablet Take 1 tablet (5 mg total) by mouth daily. 90 tablet 3  . atorvastatin (LIPITOR) 40 MG tablet TAKE 1 TABLET (40 MG TOTAL) BY MOUTH AT BEDTIME. 90 tablet 2  . B Complex-Biotin-FA (B-COMPLEX PO) Take 1 tablet by mouth daily.    . Cholecalciferol (VITAMIN D3) 5000 UNITS CAPS Take 5,000 Units by mouth daily.    Marland Kitchen co-enzyme Q-10 30 MG capsule Take 30 mg by mouth daily.    Marland Kitchen ELIQUIS 5 MG TABS tablet TAKE 1 TABLET TWICE DAILY 180 tablet 1  . hydrocortisone 2.5 % cream Apply 1 application topically as needed.    Marland Kitchen lisinopril (PRINIVIL,ZESTRIL) 10 MG tablet TAKE 1 TABLET EVERY DAY 90 tablet 3  . magnesium oxide (MAG-OX) 400 MG tablet Take 800 mg by mouth daily.    . Melatonin 10 MG CAPS Take 10 mg at bedtime by mouth.    . Multiple Vitamins-Minerals (MULTIVITAMIN WITH MINERALS) tablet Take 1 tablet by mouth daily.    . Omega-3 Fatty Acids (OMEGA-3 FISH OIL) 1200 MG CAPS Take 2 1,200 mg capsules daily 90 capsule 3  . polyethylene glycol (MIRALAX / GLYCOLAX) packet  Take 17 g by mouth daily.     No current facility-administered medications on file prior to visit.     PAST MEDICAL HISTORY: Past Medical History:  Diagnosis Date  . AAA (abdominal aortic aneurysm) ([place])   . Atrial fibrillation ([place])   . CAD (coronary artery disease)    had a MI , s/p CABG  . Cataracts, bilateral    immature  . CHF (congestive heart failure) ([place])   . Dysrhythmia    paroximal a fib  . GERD (gastroesophageal reflux disease)    takes Omeprazole daily  . History of colon polyps   . History of kidney stones   . History of shingles   . Hyperlipidemia   . Hypertension    takes Amlodipine,Metoprolol,and Lisinopril daily  . Joint pain   . Joint swelling   . Myocardial infarction [name])    several with last one being  2001(but never knew about any except 2001)  . OSA (obstructive sleep apnea)    started CPAP 12/09  . Peripheral vascular disease ([place])   . Pneumonia    as a child  . Skin cancer    several , one of them was melanoma (sees derm routinely)  . Tingling    feet occasionally  . Tinnitus     PAST SURGICAL HISTORY: Past Surgical History:  Procedure Laterality Date  . ABDOMINAL AORTAGRAM N/A 06/16/2014   Procedure: ABDOMINAL Maxcine Ham;  Surgeon: Angelia Mould, MD;  Location: [name];  Service: Cardiovascular;  Laterality: N/A;  . ABDOMINAL AORTIC ENDOVASCULAR STENT GRAFT N/A 07/08/2014   Procedure: ABDOMINAL AORTIC ENDOVASCULAR STENT GRAFT/ GORE;  Surgeon: Angelia Mould, MD;  Location: [facility];  Service: Vascular;  Laterality: N/A;  . CARDIAC CATHETERIZATION  2015  . COLONOSCOPY    . CORONARY ARTERY BYPASS GRAFT  1999   x 3 vessels  . LEFT HEART CATHETERIZATION WITH CORONARY/GRAFT ANGIOGRAM N/A 05/29/2014   Procedure: LEFT HEART CATHETERIZATION WITH [name];  Surgeon: [name], MD;  Location: [facility] CATH LAB;  Service: Cardiovascular;  Laterality: N/A;  . PENILE PROSTHESIS IMPLANT  08/2005  . RHINOPLASTY  1975    SOCIAL HISTORY: Social History   Tobacco Use  . Smoking status: Former Smoker    Types: Cigarettes    Last attempt to quit: 1979    Years since quitting: 40.9  . Smokeless tobacco: Never Used  . Tobacco comment: quit smoking in 1979  Substance Use Topics  . Alcohol use: Yes    Comment: occasionally - once per week  . Drug use: No    FAMILY HISTORY: Family History  Problem Relation Age of Onset  . Prostate cancer Father 66  . Heart disease Father   . Skin cancer Father   . Heart attack Father   . Cancer Father   . Deep vein thrombosis Father   . Hyperlipidemia Father   . Hypertension Father   . Heart disease Mother   . Skin cancer Mother   . Heart attack Mother   . Cancer Mother   . Hyperlipidemia Mother   . Hypertension  Mother   . Varicose Veins Mother   . Peripheral vascular disease Mother        amputation  . Sleep apnea Brother   . Hyperlipidemia Brother   . Hypertension Brother   . Prostate cancer Brother 71  . Colon cancer Other        aunt  . Skin cancer Brother   . Skin cancer Sister   .  Cancer Sister   . Heart disease Sister   . Diabetes Neg Hx   . Stroke Neg Hx     ROS: Review of Systems  Constitutional: Negative for weight loss.  Cardiovascular: Negative for palpitations.  Neurological: Positive for tremors.  Endo/Heme/Allergies:       Negative hot/cold intolerance  Psychiatric/Behavioral: Positive for depression. Negative for suicidal ideas.    PHYSICAL EXAM: Blood pressure 136/74, pulse 70, temperature 97.7 F (36.5 C), temperature source Oral, height 5\' 9"  (1.753 m), weight 209 lb (94.8 kg), SpO2 97 %. Body mass index is 30.86 kg/m. Physical Exam  Constitutional: He is oriented to person, place, and time. He appears well-developed and well-nourished.  Cardiovascular: Normal rate.  Pulmonary/Chest: Effort normal.  Musculoskeletal: Normal range of motion.  Neurological: He is oriented to person, place, and time.  Skin: Skin is warm and dry.  Psychiatric: He has a normal mood and affect. His behavior is normal.  Vitals reviewed.   RECENT LABS AND TESTS: BMET    Component Value Date/Time   NA 142 06/06/2018 0000   K 5.0 06/06/2018 0000   CL 102 06/06/2018 0000   CO2 25 06/06/2018 0000   GLUCOSE 97 06/06/2018 0000   GLUCOSE 99 01/01/2018 1017   BUN 25 06/06/2018 0000   CREATININE 1.28 (H) 06/06/2018 0000   CREATININE 1.08 12/14/2015 1122   CALCIUM 9.7 06/06/2018 0000   GFRNONAA 53 (L) 06/06/2018 0000   GFRAA 62 06/06/2018 0000   Lab Results  Component Value Date   HGBA1C 5.8 (H) 06/06/2018   HGBA1C 5.6 08/14/2017   HGBA1C 5.7 (H) 04/19/2017   HGBA1C 5.9 (H) 01/18/2017   HGBA1C 6.1 12/07/2016   Lab Results  Component Value Date   INSULIN 9.6 06/06/2018    INSULIN 10.8 08/14/2017   INSULIN 17.3 04/19/2017   INSULIN 16.1 01/18/2017   CBC    Component Value Date/Time   WBC 4.4 06/06/2018 0000   WBC 4.6 01/01/2018 1017   RBC 4.55 06/06/2018 0000   RBC 4.77 01/01/2018 1017   HGB 14.6 06/06/2018 0000   HCT 43.0 06/06/2018 0000   PLT 147 (L) 06/06/2018 0000   MCV 95 06/06/2018 0000   MCH 32.1 06/06/2018 0000   MCH 31.1 12/14/2015 1122   MCHC 34.0 06/06/2018 0000   MCHC 34.1 01/01/2018 1017   RDW 14.2 06/06/2018 0000   LYMPHSABS 1.4 06/06/2018 0000   MONOABS 0.4 01/01/2018 1017   EOSABS 0.1 06/06/2018 0000   BASOSABS 0.0 06/06/2018 0000   Iron/TIBC/Ferritin/ %Sat No results found for: IRON, TIBC, FERRITIN, IRONPCTSAT Lipid Panel     Component Value Date/Time   CHOL 124 09/12/2018 0837   TRIG 137 09/12/2018 0837   TRIG 256 09/14/2009 0000   HDL 43 09/12/2018 0837   CHOLHDL 2.9 09/12/2018 0837   CHOLHDL 3 12/07/2016 0852   VLDL 30.8 12/07/2016 0852   LDLCALC 54 09/12/2018 0837   LDLDIRECT 68.0 04/13/2015 0823   Hepatic Function Panel     Component Value Date/Time   PROT 6.6 06/06/2018 0000   ALBUMIN 4.7 06/06/2018 0000   AST 24 06/06/2018 0000   ALT 21 06/06/2018 0000   ALKPHOS 50 06/06/2018 0000   BILITOT 0.6 06/06/2018 0000      Component Value Date/Time   TSH 4.140 08/06/2018 1148   TSH 4.010 01/31/2018 1158   TSH 2.430 10/19/2017 0906    ASSESSMENT AND PLAN: Subclinical hypothyroidism  Action tremor  Other depression - with emotional eating - Plan: buPROPion [facility]  SR) 150 MG 12 hr tablet  Class 1 obesity with serious comorbidity and body mass index (BMI) of 30.0 to 30.9 in adult, unspecified obesity type  Other specified hypothyroidism - Plan: levothyroxine (SYNTHROID, LEVOTHROID) 25 MCG tablet  Other depression - with emotional eating  - Plan: buPROPion (WELLBUTRIN SR) 150 MG 12 hr tablet  PLAN:  Hypothyroidism Josha was informed of the importance of good thyroid control to help with weight  loss efforts. He was also informed that supertheraputic thyroid levels are dangerous and will not improve weight loss results. Marcial agrees to continue taking levothyroxine 25 mcg qd #30 and we will refill for 1 month. [name] agrees to follow with our clinic in 3 to 4 weeks.  Action Tremor Juancarlos was educated on action versus resting tremor. He was offered a referral to Neurology, but he is reassured for now and declined. Renji agrees to follow up with our clinic in 3 to 4 weeks.  Depression with Emotional Eating Behaviors We discussed behavior modification techniques today to help [place] deal with his emotional eating and depression. Carvell agrees to continue taking Wellbutrin SR 150 mg qd #30 and we will refill for 1 month. Jasmond agrees to follow up with our clinic in 3 to 4 weeks.  Obesity Tabius is currently in the action stage of change. As such, his goal is to continue with weight loss efforts He has agreed to follow the Category 2 plan Janssen has been instructed to work up to a goal of 150 minutes of combined cardio and strengthening exercise per week or as is for weight loss and overall health benefits. We discussed the following Behavioral Modification Strategies today: increasing lean protein intake, decreasing simple carbohydrates , work on meal planning and easy cooking plans and holiday eating strategies    Giovanni has agreed to follow up with our clinic in 3 to 4 weeks. He was informed of the importance of frequent follow up visits to maximize his success with intensive lifestyle modifications for his multiple health conditions.   OBESITY BEHAVIORAL INTERVENTION VISIT  Today's visit was # 31   Starting weight: 230 lbs Starting date: 01/18/17 Today's weight : 209 lbs Today's date: 09/19/2018 Total lbs lost to date: 21 At least 15 minutes were spent on discussing the following behavioral intervention visit.   ASK: We discussed the diagnosis of obesity with [name]  today and Rector agreed to give [place] permission to discuss obesity behavioral modification therapy today.  ASSESS: Kaycen has the diagnosis of obesity and his BMI today is 30.85 Mahdi is in the action stage of change   ADVISE: Johncarlos was educated on the multiple health risks of obesity as well as the benefit of weight loss to improve his health. He was advised of the need for long term treatment and the importance of lifestyle modifications to improve his current health and to decrease his risk of future health problems.  AGREE: Multiple dietary modification options and treatment options were discussed and  Zackerie agreed to follow the recommendations documented in the above note.  ARRANGE: Armour was educated on the importance of frequent visits to treat obesity as outlined per CMS and USPSTF guidelines and agreed to schedule his next follow up appointment today.  I, Trixie Dredge, am acting as transcriptionist for [name], MD  I have reviewed the above documentation for accuracy and completeness, and I agree with the above. -[name], MD

## 2018-10-02 ENCOUNTER — Encounter (INDEPENDENT_AMBULATORY_CARE_PROVIDER_SITE_OTHER): Payer: Self-pay | Admitting: Family Medicine

## 2018-10-17 ENCOUNTER — Ambulatory Visit (INDEPENDENT_AMBULATORY_CARE_PROVIDER_SITE_OTHER): Payer: Medicare HMO | Admitting: Family Medicine

## 2018-10-17 ENCOUNTER — Encounter (INDEPENDENT_AMBULATORY_CARE_PROVIDER_SITE_OTHER): Payer: Self-pay | Admitting: Family Medicine

## 2018-10-17 VITALS — BP 148/76 | HR 71 | Ht 69.0 in | Wt 212.0 lb

## 2018-10-17 DIAGNOSIS — E669 Obesity, unspecified: Secondary | ICD-10-CM | POA: Diagnosis not present

## 2018-10-17 DIAGNOSIS — Z6831 Body mass index (BMI) 31.0-31.9, adult: Secondary | ICD-10-CM

## 2018-10-17 DIAGNOSIS — E038 Other specified hypothyroidism: Secondary | ICD-10-CM

## 2018-10-17 DIAGNOSIS — I10 Essential (primary) hypertension: Secondary | ICD-10-CM

## 2018-10-17 DIAGNOSIS — F3289 Other specified depressive episodes: Secondary | ICD-10-CM | POA: Diagnosis not present

## 2018-10-17 MED ORDER — BUPROPION HCL ER (SR) 150 MG PO TB12: 150.0000 mg | ORAL_TABLET | Freq: Every day | ORAL | 0 refills | Status: DC

## 2018-10-17 MED ORDER — LEVOTHYROXINE SODIUM 25 MCG PO TABS: 25.0000 ug | ORAL_TABLET | Freq: Every day | ORAL | 0 refills | Status: DC

## 2018-10-17 NOTE — Progress Notes (Signed)
Office: [phone]  /  Fax: [phone]   HPI:   Chief Complaint: OBESITY Philip [name] is here to discuss his progress with his obesity treatment plan. He is on the Category 2 plan and is following his eating plan approximately 50 % of the time. He states he is walking, and doing stretching, strengthening, and other exercises 60 minutes 3 to 5 times per week. Philip [name] has had increased stress caring for an elderly aunt. He is retaining some fluid today and notes some increased sodium with eating out.  His weight is 212 lb (96.2 kg) today and has had a weight gain of 3 pounds over a period of 4 weeks since his last visit. He has lost 18 lbs since starting treatment with [place].  Hypothyroid Philip [name] has a diagnosis of hypothyroidism. He is on levothyroxine 36mcg and is stable with his last level at goal. He denies hot or cold intolerance.  Depression with emotional eating behaviors Philip [name] is struggling with emotional eating and using food for comfort to the extent that it is negatively impacting his health. He often snacks when he is not hungry. Philip [name] sometimes feels he is out of control and then feels guilty that he made poor food choices. He has been working on behavior modification techniques to help reduce his emotional eating and has been somewhat successful. His mood is stable on Wellbutrin and his blood pressure is slightly elevated today. He denies insomnia and xerostomia.  Hypertension Philip [name] is a 77 y.o. male with hypertension. Philip [name]'s blood pressure is borderline elevated at 148/76 on Norvasc 5mg  and lisinopril 10mg . He takes both in the morning. He is working weight loss to help control his blood pressure with the goal of decreasing his risk of heart attack and stroke.   ASSESSMENT AND PLAN:  Other specified hypothyroidism - Plan: levothyroxine (SYNTHROID, LEVOTHROID) 25 MCG tablet  Essential hypertension  Other depression - with emotional eating  - Plan: buPROPion (WELLBUTRIN  SR) 150 MG 12 hr tablet  Class 1 obesity with serious comorbidity and body mass index (BMI) of 31.0 to 31.9 in adult, unspecified obesity type  PLAN:  Hypothyroid Philip [name] was informed of the importance of good thyroid control to help with weight loss efforts. He was also informed that supertherapeutic thyroid levels are dangerous and will not improve weight loss results. He agrees to continue levothyroxine 69mcg qd #30 with no refills. Philip [name] agrees to follow up in 4 weeks.  Depression with Emotional Eating Behaviors We discussed behavior modification techniques today to help Philip [name] deal with his emotional eating and depression. He has agreed to take Wellbutrin SR 150mg  qd and agreed to follow up as directed.  Hypertension We discussed sodium restriction, working on healthy weight loss, and a regular exercise program as the means to achieve improved blood pressure control. We will continue to monitor his blood pressure as well as his progress with the above lifestyle modifications. He will continue his medications as prescribed and will watch for signs of hypotension as he continues his lifestyle modifications. Philip [name] agreed to change to take his blood pressure medications to 1 in the morning and 1 in the afternoon. We will recheck his blood pressure in 1 month. He agrees to decrease his sodium and increase his water intake and will follow up as directed.  Obesity Philip [name] is currently in the action stage of change. As such, his goal is to continue with weight loss efforts. He has agreed to follow the Category 2 plan. Philip [name] has been  instructed to work up to a goal of 150 minutes of combined cardio and strengthening exercise per week for weight loss and overall health benefits. We discussed the following Behavioral Modification Strategies today: increasing lean protein intake, increase H2O intake, decreasing sodium intake, dealing with family or coworker sabotage and holiday eating strategies.    Philip [name] has agreed to follow up with our clinic in 4 weeks. He was informed of the importance of frequent follow up visits to maximize his success with intensive lifestyle modifications for his multiple health conditions.  ALLERGIES: No Known Allergies  MEDICATIONS: Current Outpatient Medications on File Prior to Visit  Medication Sig Dispense Refill  . amLODipine (NORVASC) 5 MG tablet Take 1 tablet (5 mg total) by mouth daily. 90 tablet 3  . atorvastatin (LIPITOR) 40 MG tablet TAKE 1 TABLET (40 MG TOTAL) BY MOUTH AT BEDTIME. 90 tablet 2  . B Complex-Biotin-FA (B-COMPLEX PO) Take 1 tablet by mouth daily.    . Cholecalciferol (VITAMIN D3) 5000 UNITS CAPS Take 5,000 Units by mouth daily.    Marland Kitchen co-enzyme Q-10 30 MG capsule Take 30 mg by mouth daily.    Marland Kitchen ELIQUIS 5 MG TABS tablet TAKE 1 TABLET TWICE DAILY 180 tablet 1  . hydrocortisone 2.5 % cream Apply 1 application topically as needed.    Marland Kitchen lisinopril (PRINIVIL,ZESTRIL) 10 MG tablet TAKE 1 TABLET EVERY DAY 90 tablet 3  . magnesium oxide (MAG-OX) 400 MG tablet Take 800 mg by mouth daily.    . Melatonin 10 MG CAPS Take 10 mg at bedtime by mouth.    . Multiple Vitamins-Minerals (MULTIVITAMIN WITH MINERALS) tablet Take 1 tablet by mouth daily.    . Omega-3 Fatty Acids (OMEGA-3 FISH OIL) 1200 MG CAPS Take 2 1,200 mg capsules daily 90 capsule 3  . polyethylene glycol (MIRALAX / GLYCOLAX) packet Take 17 g by mouth daily.     No current facility-administered medications on file prior to visit.     PAST MEDICAL HISTORY: Past Medical History:  Diagnosis Date  . AAA (abdominal aortic aneurysm) ([place])   . Atrial fibrillation ([place])   . CAD (coronary artery disease)    had a MI , s/p CABG  . Cataracts, bilateral    immature  . CHF (congestive heart failure) ([place])   . Dysrhythmia    paroximal a fib  . GERD (gastroesophageal reflux disease)    takes Omeprazole daily  . History of colon polyps   . History of kidney stones   . History of  shingles   . Hyperlipidemia   . Hypertension    takes Amlodipine,Metoprolol,and Lisinopril daily  . Joint pain   . Joint swelling   . Myocardial infarction [facility])    several with last one being 2001(but never knew about any except 2001)  . OSA (obstructive sleep apnea)    started CPAP 12/09  . Peripheral vascular disease ([place])   . Pneumonia    as a child  . Skin cancer    several , one of them was melanoma (sees derm routinely)  . Tingling    feet occasionally  . Tinnitus     PAST SURGICAL HISTORY: Past Surgical History:  Procedure Laterality Date  . ABDOMINAL AORTAGRAM N/A 06/16/2014   Procedure: ABDOMINAL Maxcine Ham;  Surgeon: Angelia Mould, MD;  Location: [facility] CATH LAB;  Service: Cardiovascular;  Laterality: N/A;  . ABDOMINAL AORTIC ENDOVASCULAR STENT GRAFT N/A 07/08/2014   Procedure: ABDOMINAL AORTIC ENDOVASCULAR STENT GRAFT/ GORE;  Surgeon: Angelia Mould, MD;  Location: [facility] OR;  Service: Vascular;  Laterality: N/A;  . CARDIAC CATHETERIZATION  2015  . COLONOSCOPY    . CORONARY ARTERY BYPASS GRAFT  1999   x 3 vessels  . LEFT HEART CATHETERIZATION WITH CORONARY/GRAFT ANGIOGRAM N/A 05/29/2014   Procedure: LEFT HEART CATHETERIZATION WITH [name];  Surgeon: [name], MD;  Location[facility] C[facility] LAB;  Service: Cardiovascular;  Laterality: N/A;  . PENILE PROSTHESIS IMPLANT  08/2005  . RHINOPLASTY  1975    SOCIAL HISTORY: Social History   Tobacco Use  . Smoking status: Former Smoker    Types: Cigarettes    Last attempt to quit: 1979    Years since quitting: 40.9  . Smokeless tobacco: Never Used  . Tobacco comment: quit smoking in 1979  Substance Use Topics  . Alcohol use: Yes    Comment: occasionally - once per week  . Drug use: No    FAMILY HISTORY: Family History  Problem Relation Age of Onset  . Prostate cancer Father 86  . Heart disease Father   . Skin cancer Father   . Heart attack Father   . Cancer Father   . Deep vein  thrombosis Father   . Hyperlipidemia Father   . Hypertension Father   . Heart disease Mother   . Skin cancer Mother   . Heart attack Mother   . Cancer Mother   . Hyperlipidemia Mother   . Hypertension Mother   . Varicose Veins Mother   . Peripheral vascular disease Mother        amputation  . Sleep apnea Brother   . Hyperlipidemia Brother   . Hypertension Brother   . Prostate cancer Brother 64  . Colon cancer Other        aunt  . Skin cancer Brother   . Skin cancer Sister   . Cancer Sister   . Heart disease Sister   . Diabetes Neg Hx   . Stroke Neg Hx     ROS: Review of Systems  Constitutional: Negative for weight loss.  HENT:       Negative for xerostomia.  Endo/Heme/Allergies:       Negative for heat intolerance. Negative for cold intolerance.  Psychiatric/Behavioral: Positive for depression. The patient does not have insomnia.    PHYSICAL EXAM: Blood pressure (!) 148/76, pulse 71, height 5\' 9"  (1.753 m), weight 212 lb (96.2 kg), SpO2 98 %. Body mass index is 31.31 kg/m. Physical Exam Vitals signs reviewed.  Constitutional:      Appearance: Normal appearance. He is obese.  Cardiovascular:     Rate and Rhythm: Normal rate.  Pulmonary:     Effort: Pulmonary effort is normal.  Musculoskeletal: Normal range of motion.  Skin:    General: Skin is warm and dry.  Neurological:     Mental Status: He is alert and oriented to person, place, and time.  Psychiatric:        Mood and Affect: Mood normal.        Behavior: Behavior normal.    RECENT LABS AND TESTS: BMET    Component Value Date/Time   NA 142 06/06/2018 0000   K 5.0 06/06/2018 0000   CL 102 06/06/2018 0000   CO2 25 06/06/2018 0000   GLUCOSE 97 06/06/2018 0000   GLUCOSE 99 01/01/2018 1017   BUN 25 06/06/2018 0000   CREATININE 1.28 (H) 06/06/2018 0000   CREATININE 1.08 12/14/2015 1122   CALCIUM 9.7 06/06/2018 0000   GFRNONAA 53 (L) 06/06/2018 0000  GFRAA 62 06/06/2018 0000   Lab Results   Component Value Date   HGBA1C 5.8 (H) 06/06/2018   HGBA1C 5.6 08/14/2017   HGBA1C 5.7 (H) 04/19/2017   HGBA1C 5.9 (H) 01/18/2017   HGBA1C 6.1 12/07/2016   Lab Results  Component Value Date   INSULIN 9.6 06/06/2018   INSULIN 10.8 08/14/2017   INSULIN 17.3 04/19/2017   INSULIN 16.1 01/18/2017   CBC    Component Value Date/Time   WBC 4.4 06/06/2018 0000   WBC 4.6 01/01/2018 1017   RBC 4.55 06/06/2018 0000   RBC 4.77 01/01/2018 1017   HGB 14.6 06/06/2018 0000   HCT 43.0 06/06/2018 0000   PLT 147 (L) 06/06/2018 0000   MCV 95 06/06/2018 0000   MCH 32.1 06/06/2018 0000   MCH 31.1 12/14/2015 1122   MCHC 34.0 06/06/2018 0000   MCHC 34.1 01/01/2018 1017   RDW 14.2 06/06/2018 0000   LYMPHSABS 1.4 06/06/2018 0000   MONOABS 0.4 01/01/2018 1017   EOSABS 0.1 06/06/2018 0000   BASOSABS 0.0 06/06/2018 0000   Iron/TIBC/Ferritin/ %Sat No results found for: IRON, TIBC, FERRITIN, IRONPCTSAT Lipid Panel     Component Value Date/Time   CHOL 124 09/12/2018 0837   TRIG 137 09/12/2018 0837   TRIG 256 09/14/2009 0000   HDL 43 09/12/2018 0837   CHOLHDL 2.9 09/12/2018 0837   CHOLHDL 3 12/07/2016 0852   VLDL 30.8 12/07/2016 0852   LDLCALC 54 09/12/2018 0837   LDLDIRECT 68.0 04/13/2015 0823   Hepatic Function Panel     Component Value Date/Time   PROT 6.6 06/06/2018 0000   ALBUMIN 4.7 06/06/2018 0000   AST 24 06/06/2018 0000   ALT 21 06/06/2018 0000   ALKPHOS 50 06/06/2018 0000   BILITOT 0.6 06/06/2018 0000      Component Value Date/Time   TSH 4.140 08/06/2018 1148   TSH 4.010 01/31/2018 1158   TSH 2.430 10/19/2017 0906   Results for Mcgilvery, JAYAN RAYMUNDO (MRN [id number]) as of 10/17/2018 15:50  Ref. Range 06/06/2018 00:00  Vitamin D, 25-Hydroxy Latest Ref Range: 30.0 - 100.0 ng/mL 53.8    OBESITY BEHAVIORAL INTERVENTION VISIT  Today's visit was # 32   Starting weight: 230 lbs Starting date: 01/18/17 Today's weight : Weight: 212 lb (96.2 kg)  Today's date: 10/17/2018 Total lbs  lost to date: 18 At least 15 minutes were spent on discussing the following behavioral intervention visit.  ASK: We discussed the diagnosis of obesity with [name] today and [name] agreed to give [place] permission to discuss obesity behavioral modification therapy today.  ASSESS: Travanti has the diagnosis of obesity and his BMI today is 31.2. Serge is in the action stage of change.   ADVISE: Jaxon was educated on the multiple health risks of obesity as well as the benefit of weight loss to improve his health. He was advised of the need for long term treatment and the importance of lifestyle modifications to improve his current health and to decrease his risk of future health problems.  AGREE: Multiple dietary modification options and treatment options were discussed and Shrihaan agreed to follow the recommendations documented in the above note.  ARRANGE: Damian was educated on the importance of frequent visits to treat obesity as outlined per CMS and USPSTF guidelines and agreed to schedule his next follow up appointment today.  I, Marcille Blanco, am acting as transcriptionist for Starlyn Skeans, MD  I have reviewed the above documentation for accuracy and completeness, and I agree with  the above. -[name], MD

## 2018-11-09 DIAGNOSIS — G4733 Obstructive sleep apnea (adult) (pediatric): Secondary | ICD-10-CM | POA: Diagnosis not present

## 2018-11-14 ENCOUNTER — Encounter (INDEPENDENT_AMBULATORY_CARE_PROVIDER_SITE_OTHER): Payer: Self-pay | Admitting: Family Medicine

## 2018-11-14 ENCOUNTER — Ambulatory Visit (INDEPENDENT_AMBULATORY_CARE_PROVIDER_SITE_OTHER): Payer: Medicare HMO | Admitting: Family Medicine

## 2018-11-14 VITALS — BP 153/77 | HR 68 | Temp 98.0°F | Ht 69.0 in | Wt 215.0 lb

## 2018-11-14 DIAGNOSIS — E669 Obesity, unspecified: Secondary | ICD-10-CM | POA: Diagnosis not present

## 2018-11-14 DIAGNOSIS — F3289 Other specified depressive episodes: Secondary | ICD-10-CM

## 2018-11-14 DIAGNOSIS — E038 Other specified hypothyroidism: Secondary | ICD-10-CM

## 2018-11-14 DIAGNOSIS — Z6831 Body mass index (BMI) 31.0-31.9, adult: Secondary | ICD-10-CM

## 2018-11-14 MED ORDER — LEVOTHYROXINE SODIUM 25 MCG PO TABS: 25.0000 ug | ORAL_TABLET | Freq: Every day | ORAL | 0 refills | Status: DC

## 2018-11-14 MED ORDER — BUPROPION HCL ER (SR) 150 MG PO TB12: 150.0000 mg | ORAL_TABLET | Freq: Every day | ORAL | 0 refills | Status: DC

## 2018-11-15 NOTE — Progress Notes (Signed)
Office: [phone]  /  Fax: [phone]   HPI:   Chief Complaint: OBESITY Massey is here to discuss his progress with his obesity treatment plan. He is on the Category 2 plan and is following his eating plan approximately 50 % of the time. He states he is exercising 60 minutes 3 to 7 times per week. Jarrid indulged over the holidays, but he is ready to get back on track. We discussed different eating plans as Daylon is getting a bit bored. His weight is 215 lb (97.5 kg) today and has had a weight gain of 3 pounds over a period of 4 weeks since his last visit. He has lost 15 lbs since starting treatment with [place].  Hypothyroidism Mivaan has a diagnosis of hypothyroidism. He is stable on levothyroxine. He denies hot or cold intolerance.   Depression with emotional eating behaviors Brandonlee  struggles with emotional eating and using food for comfort to the extent that it is negatively impacting his health. He often snacks when he is not hungry. Lestat sometimes feels he is out of control and then feels guilty that he made poor food choices. He has been working on behavior modification techniques to help reduce his emotional eating and has been somewhat successful. He is stable on Wellbutrin and he has no insomnia. His blood pressure is elevated today, which is unusual. He shows no sign of suicidal or homicidal ideations.  Depression screen [facility] 2/9 09/03/2018 01/18/2017 12/06/2016 12/21/2015 06/19/2015  Decreased Interest 0 3 1 0 0  Down, Depressed, Hopeless 0 1 0 0 0  PHQ - 2 Score 0 4 1 0 0  Altered sleeping - 2 - - -  Tired, decreased energy - 2 - - -  Change in appetite - 1 - - -  Feeling bad or failure about yourself  - 2 - - -  Trouble concentrating - 1 - - -  Moving slowly or fidgety/restless - 1 - - -  Suicidal thoughts - 0 - - -  PHQ-9 Score - 13 - - -      ASSESSMENT AND PLAN:  Other specified hypothyroidism - Plan: levothyroxine (SYNTHROID, LEVOTHROID) 25 MCG tablet  Other  depression - with emotional eating  - Plan: buPROPion (WELLBUTRIN SR) 150 MG 12 hr tablet  Class 1 obesity with serious comorbidity and body mass index (BMI) of 31.0 to 31.9 in adult, unspecified obesity type  PLAN:  Hypothyroidism Aspen was informed of the importance of good thyroid control to help with weight loss efforts. He was also informed that supertheraputic thyroid levels are dangerous and will not improve weight loss results. Halston agreed to continue levothyroxine 25 mcg daily #30 with no refills and follow up as directed. We will check labs at the next visit.  Depression with Emotional Eating Behaviors We discussed behavior modification techniques today to help Vincent deal with his emotional eating and depression. He has agreed to continue Wellbutrin SR 150 mg qd #30 with no refills and follow up as directed. We will recheck his blood pressure in 3 weeks.  Obesity Jermar is currently in the action stage of change. As such, his goal is to continue with weight loss efforts He has agreed to keep a food journal with 1400 to 1700 calories and 90 grams of protein daily Jodie has been instructed to work up to a goal of 150 minutes of combined cardio and strengthening exercise per week for weight loss and overall health benefits. We discussed the following Behavioral Modification  Strategies today: keep a strict food journal, increasing lean protein intake and increasing vegetables  Akshat has agreed to follow up with our clinic in 3 weeks. He was informed of the importance of frequent follow up visits to maximize his success with intensive lifestyle modifications for his multiple health conditions.  ALLERGIES: No Known Allergies  MEDICATIONS: Current Outpatient Medications on File Prior to Visit  Medication Sig Dispense Refill   amLODipine (NORVASC) 5 MG tablet Take 1 tablet (5 mg total) by mouth daily. 90 tablet 3   atorvastatin (LIPITOR) 40 MG tablet TAKE 1 TABLET (40 MG TOTAL)  BY MOUTH AT BEDTIME. 90 tablet 2   B Complex-Biotin-FA (B-COMPLEX PO) Take 1 tablet by mouth daily.     Cholecalciferol (VITAMIN D3) 5000 UNITS CAPS Take 5,000 Units by mouth daily.     co-enzyme Q-10 30 MG capsule Take 30 mg by mouth daily.     ELIQUIS 5 MG TABS tablet TAKE 1 TABLET TWICE DAILY 180 tablet 1   hydrocortisone 2.5 % cream Apply 1 application topically as needed.     lisinopril (PRINIVIL,ZESTRIL) 10 MG tablet TAKE 1 TABLET EVERY DAY 90 tablet 3   magnesium oxide (MAG-OX) 400 MG tablet Take 800 mg by mouth daily.     Melatonin 10 MG CAPS Take 10 mg at bedtime by mouth.     Multiple Vitamins-Minerals (MULTIVITAMIN WITH MINERALS) tablet Take 1 tablet by mouth daily.     Omega-3 Fatty Acids (OMEGA-3 FISH OIL) 1200 MG CAPS Take 2 1,200 mg capsules daily 90 capsule 3   polyethylene glycol (MIRALAX / GLYCOLAX) packet Take 17 g by mouth daily.     No current facility-administered medications on file prior to visit.     PAST MEDICAL HISTORY: Past Medical History:  Diagnosis Date   AAA (abdominal aortic aneurysm) (HCC)    Atrial fibrillation (HCC)    CAD (coronary artery disease)    had a MI , s/p CABG   Cataracts, bilateral    immature   CHF (congestive heart failure) (HCC)    Dysrhythmia    paroximal a fib   GERD (gastroesophageal reflux disease)    takes Omeprazole daily   History of colon polyps    History of kidney stones    History of shingles    Hyperlipidemia    Hypertension    takes Amlodipine,Metoprolol,and Lisinopril daily   Joint pain    Joint swelling    Myocardial infarction ([place])    several with last one being 2001(but never knew about any except 2001)   OSA (obstructive sleep apnea)    started CPAP 12/09   Peripheral vascular disease ([place])    Pneumonia    as a child   Skin cancer    several , one of them was melanoma (sees derm routinely)   Tingling    feet occasionally   Tinnitus     PAST SURGICAL HISTORY: Past  Surgical History:  Procedure Laterality Date   ABDOMINAL AORTAGRAM N/A 06/16/2014   Procedure: ABDOMINAL Maxcine Ham;  Surgeon: Angelia Mould, MD;  Location[facility] CATH LAB;  Service: Cardiovascular;  Laterality: N/A;   ABDOMINAL AORTIC ENDOVASCULAR STENT GRAFT N/A 07/08/2014   Procedure: ABDOMINAL AORTIC ENDOVASCULAR STENT GRAFT/ GORE;  Surgeon: Angelia Mould, MD;  Location: [facility];  Service: Vascular;  Laterality: N/A;   CARDIAC CATHETERIZATION  2015   COLONOSCOPY     CORONARY ARTERY BYPASS GRAFT  1999   x 3 vessels   LEFT HEART CATHETERIZATION WITH  CORONARY/GRAFT ANGIOGRAM N/A 05/29/2014   Procedure: LEFT HEART CATHETERIZATION WITH [name];  Surgeon: [name], MD;  Location: [facility] CATH LAB;  Service: Cardiovascular;  Laterality: N/A;   PENILE PROSTHESIS IMPLANT  08/2005   RHINOPLASTY  1975    SOCIAL HISTORY: Social History   Tobacco Use   Smoking status: Former Smoker    Types: Cigarettes    Last attempt to quit: 1979    Years since quitting: 41.0   Smokeless tobacco: Never Used   Tobacco comment: quit smoking in 1979  Substance Use Topics   Alcohol use: Yes    Comment: occasionally - once per week   Drug use: No    FAMILY HISTORY: Family History  Problem Relation Age of Onset   Prostate cancer Father 18   Heart disease Father    Skin cancer Father    Heart attack Father    Cancer Father    Deep vein thrombosis Father    Hyperlipidemia Father    Hypertension Father    Heart disease Mother    Skin cancer Mother    Heart attack Mother    Cancer Mother    Hyperlipidemia Mother    Hypertension Mother    Varicose Veins Mother    Peripheral vascular disease Mother        amputation   Sleep apnea Brother    Hyperlipidemia Brother    Hypertension Brother    Prostate cancer Brother 52   Colon cancer Other        aunt   Skin cancer Brother    Skin cancer Sister    Cancer Sister    Heart disease  Sister    Diabetes Neg Hx    Stroke Neg Hx     ROS: Review of Systems  Constitutional: Negative for weight loss.  Endo/Heme/Allergies:       Negative for hot or cold intolerance  Psychiatric/Behavioral: Positive for depression. Negative for suicidal ideas. The patient does not have insomnia.     PHYSICAL EXAM: Blood pressure (!) 153/77, pulse 68, temperature 98 F (36.7 C), temperature source Oral, height 5\' 9"  (1.753 m), weight 215 lb (97.5 kg), SpO2 97 %. Body mass index is 31.75 kg/m. Physical Exam Vitals signs reviewed.  Constitutional:      Appearance: Normal appearance. He is well-developed. He is obese.  Cardiovascular:     Rate and Rhythm: Normal rate.  Pulmonary:     Effort: Pulmonary effort is normal.  Musculoskeletal: Normal range of motion.  Skin:    General: Skin is warm and dry.  Neurological:     Mental Status: He is alert and oriented to person, place, and time.  Psychiatric:        Mood and Affect: Mood normal.        Behavior: Behavior normal.        Thought Content: Thought content does not include homicidal or suicidal ideation.     RECENT LABS AND TESTS: BMET    Component Value Date/Time   NA 142 06/06/2018 0000   K 5.0 06/06/2018 0000   CL 102 06/06/2018 0000   CO2 25 06/06/2018 0000   GLUCOSE 97 06/06/2018 0000   GLUCOSE 99 01/01/2018 1017   BUN 25 06/06/2018 0000   CREATININE 1.28 (H) 06/06/2018 0000   CREATININE 1.08 12/14/2015 1122   CALCIUM 9.7 06/06/2018 0000   GFRNONAA 53 (L) 06/06/2018 0000   GFRAA 62 06/06/2018 0000   Lab Results  Component Value Date   HGBA1C  5.8 (H) 06/06/2018   HGBA1C 5.6 08/14/2017   HGBA1C 5.7 (H) 04/19/2017   HGBA1C 5.9 (H) 01/18/2017   HGBA1C 6.1 12/07/2016   Lab Results  Component Value Date   INSULIN 9.6 06/06/2018   INSULIN 10.8 08/14/2017   INSULIN 17.3 04/19/2017   INSULIN 16.1 01/18/2017   CBC    Component Value Date/Time   WBC 4.4 06/06/2018 0000   WBC 4.6 01/01/2018 1017   RBC  4.55 06/06/2018 0000   RBC 4.77 01/01/2018 1017   HGB 14.6 06/06/2018 0000   HCT 43.0 06/06/2018 0000   PLT 147 (L) 06/06/2018 0000   MCV 95 06/06/2018 0000   MCH 32.1 06/06/2018 0000   MCH 31.1 12/14/2015 1122   MCHC 34.0 06/06/2018 0000   MCHC 34.1 01/01/2018 1017   RDW 14.2 06/06/2018 0000   LYMPHSABS 1.4 06/06/2018 0000   MONOABS 0.4 01/01/2018 1017   EOSABS 0.1 06/06/2018 0000   BASOSABS 0.0 06/06/2018 0000   Iron/TIBC/Ferritin/ %Sat No results found for: IRON, TIBC, FERRITIN, IRONPCTSAT Lipid Panel     Component Value Date/Time   CHOL 124 09/12/2018 0837   TRIG 137 09/12/2018 0837   TRIG 256 09/14/2009 0000   HDL 43 09/12/2018 0837   CHOLHDL 2.9 09/12/2018 0837   CHOLHDL 3 12/07/2016 0852   VLDL 30.8 12/07/2016 0852   LDLCALC 54 09/12/2018 0837   LDLDIRECT 68.0 04/13/2015 0823   Hepatic Function Panel     Component Value Date/Time   PROT 6.6 06/06/2018 0000   ALBUMIN 4.7 06/06/2018 0000   AST 24 06/06/2018 0000   ALT 21 06/06/2018 0000   ALKPHOS 50 06/06/2018 0000   BILITOT 0.6 06/06/2018 0000      Component Value Date/Time   TSH 4.140 08/06/2018 1148   TSH 4.010 01/31/2018 1158   TSH 2.430 10/19/2017 0906     Ref. Range 06/06/2018 00:00  Vitamin D, 25-Hydroxy Latest Ref Range: 30.0 - 100.0 ng/mL 53.8     OBESITY BEHAVIORAL INTERVENTION VISIT  Today's visit was # 72  Starting weight: 230 lbs Starting date: 01/18/2017 Today's weight : 215 lbs  Today's date: 11/14/2018 Total lbs lost to date: 15 At least 15 minutes were spent on discussing the following behavioral intervention visit.   ASK: We discussed the diagnosis of obesity with [name] today and Gerrald agreed to give [place] permission to discuss obesity behavioral modification therapy today.  ASSESS: Finnlee has the diagnosis of obesity and his BMI today is 31.74 Tristain is in the action stage of change   ADVISE: Keefer was educated on the multiple health risks of obesity as well as the  benefit of weight loss to improve his health. He was advised of the need for long term treatment and the importance of lifestyle modifications to improve his current health and to decrease his risk of future health problems.  AGREE: Multiple dietary modification options and treatment options were discussed and  Kymari agreed to follow the recommendations documented in the above note.  ARRANGE: Shandy was educated on the importance of frequent visits to treat obesity as outlined per CMS and USPSTF guidelines and agreed to schedule his next follow up appointment today.  I, Doreene Nest, am acting as transcriptionist for [name], MD  I have reviewed the above documentation for accuracy and completeness, and I agree with the above. -[name], MD

## 2018-11-21 DIAGNOSIS — L570 Actinic keratosis: Secondary | ICD-10-CM | POA: Diagnosis not present

## 2018-11-21 DIAGNOSIS — Z85828 Personal history of other malignant neoplasm of skin: Secondary | ICD-10-CM | POA: Diagnosis not present

## 2018-11-21 DIAGNOSIS — L821 Other seborrheic keratosis: Secondary | ICD-10-CM | POA: Diagnosis not present

## 2018-11-21 DIAGNOSIS — L820 Inflamed seborrheic keratosis: Secondary | ICD-10-CM | POA: Diagnosis not present

## 2018-11-21 DIAGNOSIS — L819 Disorder of pigmentation, unspecified: Secondary | ICD-10-CM | POA: Diagnosis not present

## 2018-11-21 DIAGNOSIS — D485 Neoplasm of uncertain behavior of skin: Secondary | ICD-10-CM | POA: Diagnosis not present

## 2018-11-21 DIAGNOSIS — L578 Other skin changes due to chronic exposure to nonionizing radiation: Secondary | ICD-10-CM | POA: Diagnosis not present

## 2018-11-21 DIAGNOSIS — D229 Melanocytic nevi, unspecified: Secondary | ICD-10-CM | POA: Diagnosis not present

## 2018-12-09 ENCOUNTER — Other Ambulatory Visit: Payer: Self-pay | Admitting: Internal Medicine

## 2018-12-10 ENCOUNTER — Ambulatory Visit (INDEPENDENT_AMBULATORY_CARE_PROVIDER_SITE_OTHER): Payer: Medicare HMO | Admitting: Family Medicine

## 2018-12-10 ENCOUNTER — Telehealth: Payer: Self-pay

## 2018-12-10 ENCOUNTER — Encounter (INDEPENDENT_AMBULATORY_CARE_PROVIDER_SITE_OTHER): Payer: Self-pay | Admitting: Family Medicine

## 2018-12-10 VITALS — BP 135/79 | HR 71 | Temp 97.8°F | Ht 69.0 in | Wt 204.0 lb

## 2018-12-10 DIAGNOSIS — E038 Other specified hypothyroidism: Secondary | ICD-10-CM | POA: Diagnosis not present

## 2018-12-10 DIAGNOSIS — E063 Autoimmune thyroiditis: Secondary | ICD-10-CM

## 2018-12-10 DIAGNOSIS — F3289 Other specified depressive episodes: Secondary | ICD-10-CM

## 2018-12-10 DIAGNOSIS — Z6830 Body mass index (BMI) 30.0-30.9, adult: Secondary | ICD-10-CM | POA: Diagnosis not present

## 2018-12-10 DIAGNOSIS — R7303 Prediabetes: Secondary | ICD-10-CM | POA: Diagnosis not present

## 2018-12-10 DIAGNOSIS — E669 Obesity, unspecified: Secondary | ICD-10-CM | POA: Diagnosis not present

## 2018-12-10 DIAGNOSIS — E559 Vitamin D deficiency, unspecified: Secondary | ICD-10-CM | POA: Diagnosis not present

## 2018-12-10 MED ORDER — BUPROPION HCL ER (SR) 150 MG PO TB12: 150.0000 mg | ORAL_TABLET | Freq: Every day | ORAL | 0 refills | Status: DC

## 2018-12-10 MED ORDER — LEVOTHYROXINE SODIUM 25 MCG PO TABS: 25.0000 ug | ORAL_TABLET | Freq: Every day | ORAL | 0 refills | Status: DC

## 2018-12-10 NOTE — Progress Notes (Signed)
Office: [phone]  /  Fax: [phone]   HPI:   Chief Complaint: OBESITY Philip [name] is here to discuss his progress with his obesity treatment plan. He is keeping a food journal with 1400 to 1700 calories and 90 grams of protein  and is following his eating plan approximately 100 % of the time. He states he is exercising 60 to 120 minutes 7 times per week. Forrester continues to do well with weight loss. He notes hunger is controlled and he feels better overall.  His weight is 204 lb (92.5 kg) today and has had a weight loss of 11 pounds over a period of 4 weeks since his last visit. He has lost 26 lbs since starting treatment with [place].  Hashimoto's Hypothyroid Philip [name] has a diagnosis of hypothyroidism. He is on levothyroxine 38mcg. He denies hot or cold intolerance or tremors. He is due for labs today.  Depression with emotional eating behaviors Philip [name]'s mood is stable and his blood pressure is controlled. His fatigue has improved and he denies insomnia. He is struggling with emotional eating and using food for comfort to the extent that it is negatively impacting his health. He often snacks when he is not hungry. Philip [name] sometimes feels he is out of control and then feels guilty that he made poor food choices. He has been working on behavior modification techniques to help reduce his emotional eating and has been somewhat successful.   Vitamin D deficiency Philip [name] has a diagnosis of vitamin D deficiency. He is currently taking OTC vit D 5,000 IU per day and is now at goal. He is due for labs today.  ASSESSMENT AND PLAN:  Hypothyroidism due to Hashimoto's thyroiditis - Plan: Comprehensive metabolic panel, Hemoglobin A1c, Insulin, random, T3, T4, free, TSH, levothyroxine (SYNTHROID, LEVOTHROID) 25 MCG tablet  Vitamin D deficiency - Plan: VITAMIN D 25 Hydroxy (Vit-D Deficiency, Fractures)  Other depression - with emotional eating - Plan: buPROPion (WELLBUTRIN SR) 150 MG 12 hr tablet  Class 1  obesity with serious comorbidity and body mass index (BMI) of 30.0 to 30.9 in adult, unspecified obesity type  PLAN:  Hashimoto's Hypothyroid Dent was informed of the importance of good thyroid control to help with weight loss efforts. He was also informed that supertherapeutic thyroid levels are dangerous and will not improve weight loss results. [name] agrees to continue levothyroxine 24mcg qd #30 with no refills and will follow up in 4 weeks.  Vitamin D Deficiency Philip [name] was informed that low vitamin D levels contributes to fatigue and are associated with obesity, breast, and colon cancer. He agrees to continue to take OTC Vit D @5 ,000 IU every day and will follow up for routine testing of vitamin D, at least 2-3 times per year. He was informed of the risk of over-replacement of vitamin D and agrees to not increase his dose unless he discusses this with [place] first. A vitamin D level was ordered today and [name] agrees to follow up at the agreed upon time.  Depression with Emotional Eating Behaviors We discussed behavior modification techniques today to help Philip [name] deal with his emotional eating and depression. He has agreed to take Wellbutrin SR 150mg  qd #30 with no refills and agreed to follow up as directed.  Obesity Philip [name] is currently in the action stage of change. As such, his goal is to continue with weight loss efforts. He has agreed to keep a food journal with 1500 to 1700 calories and 90+ grams of protein.  Philip [name] has been instructed to  work up to a goal of 150 minutes of combined cardio and strengthening exercise per week for weight loss and overall health benefits. We discussed the following Behavioral Modification Strategies today: increasing lean protein intake.  Philip [name] has agreed to follow up with our clinic in 4 weeks. He was informed of the importance of frequent follow up visits to maximize his success with intensive lifestyle modifications for his multiple health  conditions.  ALLERGIES: No Known Allergies  MEDICATIONS: Current Outpatient Medications on File Prior to Visit  Medication Sig Dispense Refill  . amLODipine (NORVASC) 5 MG tablet Take 1 tablet (5 mg total) by mouth daily. 90 tablet 3  . atorvastatin (LIPITOR) 40 MG tablet TAKE 1 TABLET (40 MG TOTAL) BY MOUTH AT BEDTIME. 90 tablet 2  . B Complex-Biotin-FA (B-COMPLEX PO) Take 1 tablet by mouth daily.    . Cholecalciferol (VITAMIN D3) 5000 UNITS CAPS Take 5,000 Units by mouth daily.    Marland Kitchen co-enzyme Q-10 30 MG capsule Take 30 mg by mouth daily.    Marland Kitchen ELIQUIS 5 MG TABS tablet TAKE 1 TABLET TWICE DAILY 180 tablet 1  . hydrocortisone 2.5 % cream Apply 1 application topically as needed.    Marland Kitchen lisinopril (PRINIVIL,ZESTRIL) 10 MG tablet TAKE 1 TABLET EVERY DAY 90 tablet 3  . magnesium oxide (MAG-OX) 400 MG tablet Take 800 mg by mouth daily.    . Melatonin 10 MG CAPS Take 10 mg at bedtime by mouth.    . Multiple Vitamins-Minerals (MULTIVITAMIN WITH MINERALS) tablet Take 1 tablet by mouth daily.    . Omega-3 Fatty Acids (OMEGA-3 FISH OIL) 1200 MG CAPS Take 2 1,200 mg capsules daily 90 capsule 3  . polyethylene glycol (MIRALAX / GLYCOLAX) packet Take 17 g by mouth daily.     No current facility-administered medications on file prior to visit.     PAST MEDICAL HISTORY: Past Medical History:  Diagnosis Date  . AAA (abdominal aortic aneurysm) ([place])   . Atrial fibrillation ([place])   . CAD (coronary artery disease)    had a MI , s/p CABG  . Cataracts, bilateral    immature  . CHF (congestive heart failure) ([place])   . Dysrhythmia    paroximal a fib  . GERD (gastroesophageal reflux disease)    takes Omeprazole daily  . History of colon polyps   . History of kidney stones   . History of shingles   . Hyperlipidemia   . Hypertension    takes Amlodipine,Metoprolol,and Lisinopril daily  . Joint pain   . Joint swelling   . Myocardial infarction [facility])    several with last one being 2001(but never knew  about any except 2001)  . OSA (obstructive sleep apnea)    started CPAP 12/09  . Peripheral vascular disease ([place])   . Pneumonia    as a child  . Skin cancer    several , one of them was melanoma (sees derm routinely)  . Tingling    feet occasionally  . Tinnitus     PAST SURGICAL HISTORY: Past Surgical History:  Procedure Laterality Date  . ABDOMINAL AORTAGRAM N/A 06/16/2014   Procedure: ABDOMINAL Maxcine Ham;  Surgeon: Angelia Mould, MD;  Location: [facility] CATH LAB;  Service: Cardiovascular;  Laterality: N/A;  . ABDOMINAL AORTIC ENDOVASCULAR STENT GRAFT N/A 07/08/2014   Procedure: ABDOMINAL AORTIC ENDOVASCULAR STENT GRAFT/ GORE;  Surgeon: Angelia Mould, MD;  Location: [facility];  Service: Vascular;  Laterality: N/A;  . CARDIAC CATHETERIZATION  2015  . COLONOSCOPY    .  CORONARY ARTERY BYPASS GRAFT  1999   x 3 vessels  . LEFT HEART CATHETERIZATION WITH CORONARY/GRAFT ANGIOGRAM N/A 05/29/2014   Procedure: LEFT HEART CATHETERIZATION WITH [name];  Surgeon: [name], MD;  Location: [facility] CATH LAB;  Service: Cardiovascular;  Laterality: N/A;  . PENILE PROSTHESIS IMPLANT  08/2005  . RHINOPLASTY  1975    SOCIAL HISTORY: Social History   Tobacco Use  . Smoking status: Former Smoker    Types: Cigarettes    Last attempt to quit: 1979    Years since quitting: 41.1  . Smokeless tobacco: Never Used  . Tobacco comment: quit smoking in 1979  Substance Use Topics  . Alcohol use: Yes    Comment: occasionally - once per week  . Drug use: No    FAMILY HISTORY: Family History  Problem Relation Age of Onset  . Prostate cancer Father 43  . Heart disease Father   . Skin cancer Father   . Heart attack Father   . Cancer Father   . Deep vein thrombosis Father   . Hyperlipidemia Father   . Hypertension Father   . Heart disease Mother   . Skin cancer Mother   . Heart attack Mother   . Cancer Mother   . Hyperlipidemia Mother   . Hypertension Mother   . Varicose  Veins Mother   . Peripheral vascular disease Mother        amputation  . Sleep apnea Brother   . Hyperlipidemia Brother   . Hypertension Brother   . Prostate cancer Brother 31  . Colon cancer Other        aunt  . Skin cancer Brother   . Skin cancer Sister   . Cancer Sister   . Heart disease Sister   . Diabetes Neg Hx   . Stroke Neg Hx     ROS: Review of Systems  Constitutional: Positive for weight loss.  Neurological: Negative for tremors.  Endo/Heme/Allergies:       Negative for heat intolerance. Negative for cold intolerance.  Psychiatric/Behavioral: Positive for depression. The patient does not have insomnia.     PHYSICAL EXAM: Blood pressure 135/79, pulse 71, temperature 97.8 F (36.6 C), temperature source Oral, height 5\' 9"  (1.753 m), weight 204 lb (92.5 kg), SpO2 98 %. Body mass index is 30.13 kg/m. Physical Exam Vitals signs reviewed.  Constitutional:      Appearance: Normal appearance. He is obese.  Cardiovascular:     Rate and Rhythm: Normal rate.  Pulmonary:     Effort: Pulmonary effort is normal.  Musculoskeletal: Normal range of motion.  Skin:    General: Skin is warm and dry.  Neurological:     Mental Status: He is alert and oriented to person, place, and time.  Psychiatric:        Mood and Affect: Mood normal.        Behavior: Behavior normal.     RECENT LABS AND TESTS: BMET    Component Value Date/Time   NA 142 06/06/2018 0000   K 5.0 06/06/2018 0000   CL 102 06/06/2018 0000   CO2 25 06/06/2018 0000   GLUCOSE 97 06/06/2018 0000   GLUCOSE 99 01/01/2018 1017   BUN 25 06/06/2018 0000   CREATININE 1.28 (H) 06/06/2018 0000   CREATININE 1.08 12/14/2015 1122   CALCIUM 9.7 06/06/2018 0000   GFRNONAA 53 (L) 06/06/2018 0000   GFRAA 62 06/06/2018 0000   Lab Results  Component Value Date   HGBA1C 5.8 (H)  06/06/2018   HGBA1C 5.6 08/14/2017   HGBA1C 5.7 (H) 04/19/2017   HGBA1C 5.9 (H) 01/18/2017   HGBA1C 6.1 12/07/2016   Lab Results   Component Value Date   INSULIN 9.6 06/06/2018   INSULIN 10.8 08/14/2017   INSULIN 17.3 04/19/2017   INSULIN 16.1 01/18/2017   CBC    Component Value Date/Time   WBC 4.4 06/06/2018 0000   WBC 4.6 01/01/2018 1017   RBC 4.55 06/06/2018 0000   RBC 4.77 01/01/2018 1017   HGB 14.6 06/06/2018 0000   HCT 43.0 06/06/2018 0000   PLT 147 (L) 06/06/2018 0000   MCV 95 06/06/2018 0000   MCH 32.1 06/06/2018 0000   MCH 31.1 12/14/2015 1122   MCHC 34.0 06/06/2018 0000   MCHC 34.1 01/01/2018 1017   RDW 14.2 06/06/2018 0000   LYMPHSABS 1.4 06/06/2018 0000   MONOABS 0.4 01/01/2018 1017   EOSABS 0.1 06/06/2018 0000   BASOSABS 0.0 06/06/2018 0000   Iron/TIBC/Ferritin/ %Sat No results found for: IRON, TIBC, FERRITIN, IRONPCTSAT Lipid Panel     Component Value Date/Time   CHOL 124 09/12/2018 0837   TRIG 137 09/12/2018 0837   TRIG 256 09/14/2009 0000   HDL 43 09/12/2018 0837   CHOLHDL 2.9 09/12/2018 0837   CHOLHDL 3 12/07/2016 0852   VLDL 30.8 12/07/2016 0852   LDLCALC 54 09/12/2018 0837   LDLDIRECT 68.0 04/13/2015 0823   Hepatic Function Panel     Component Value Date/Time   PROT 6.6 06/06/2018 0000   ALBUMIN 4.7 06/06/2018 0000   AST 24 06/06/2018 0000   ALT 21 06/06/2018 0000   ALKPHOS 50 06/06/2018 0000   BILITOT 0.6 06/06/2018 0000      Component Value Date/Time   TSH 4.140 08/06/2018 1148   TSH 4.010 01/31/2018 1158   TSH 2.430 10/19/2017 0906   Results for Samonte, ROD MAJERUS (MRN [id number]) as of 12/10/2018 12:20  Ref. Range 06/06/2018 00:00  Vitamin D, 25-Hydroxy Latest Ref Range: 30.0 - 100.0 ng/mL 53.8    OBESITY BEHAVIORAL INTERVENTION VISIT  Today's visit was # 34   Starting weight: 230 lbs Starting date: 01/18/17 Today's weight : Weight: 204 lb (92.5 kg)  Today's date: 12/10/2018 Total lbs lost to date: 26 At least 15 minutes were spent on discussing the following behavioral intervention visit.  ASK: We discussed the diagnosis of obesity with [name]  today and Raeden agreed to give [place] permission to discuss obesity behavioral modification therapy today.  ASSESS: Olyn has the diagnosis of obesity and his BMI today is 30.1. Hassaan is in the action stage of change.   ADVISE: Khaleel was educated on the multiple health risks of obesity as well as the benefit of weight loss to improve his health. He was advised of the need for long term treatment and the importance of lifestyle modifications to improve his current health and to decrease his risk of future health problems.  AGREE: Multiple dietary modification options and treatment options were discussed and Treyvion agreed to follow the recommendations documented in the above note.  ARRANGE: Alisha was educated on the importance of frequent visits to treat obesity as outlined per CMS and USPSTF guidelines and agreed to schedule his next follow up appointment today.  IMarcille Blanco, CMA, am acting as transcriptionist for Starlyn Skeans, MD  I have reviewed the above documentation for accuracy and completeness, and I agree with the above. -[name], MD

## 2018-12-10 NOTE — Telephone Encounter (Signed)
Spoke with patient.  Made patient aware that Dr. Saunders Revel is currently at the [facility] office.  Patient stated that he is going to continue to follow up at the [address] office.  He will be following up with Dr Irish Lack Per Dr. Saunders Revel

## 2018-12-11 ENCOUNTER — Telehealth: Payer: Self-pay | Admitting: Internal Medicine

## 2018-12-11 LAB — HEMOGLOBIN A1C
Est. average glucose Bld gHb Est-mCnc: 120 mg/dL
Hgb A1c MFr Bld: 5.8 % — ABNORMAL HIGH (ref 4.8–5.6)

## 2018-12-11 LAB — TSH: TSH: 3.25 u[IU]/mL (ref 0.450–4.500)

## 2018-12-11 LAB — COMPREHENSIVE METABOLIC PANEL
ALT: 34 IU/L (ref 0–44)
AST: 31 IU/L (ref 0–40)
Albumin/Globulin Ratio: 2.5 — ABNORMAL HIGH (ref 1.2–2.2)
Albumin: 4.9 g/dL — ABNORMAL HIGH (ref 3.7–4.7)
Alkaline Phosphatase: 73 IU/L (ref 39–117)
BILIRUBIN TOTAL: 0.6 mg/dL (ref 0.0–1.2)
BUN/Creatinine Ratio: 16 (ref 10–24)
BUN: 18 mg/dL (ref 8–27)
CO2: 26 mmol/L (ref 20–29)
Calcium: 9.9 mg/dL (ref 8.6–10.2)
Chloride: 101 mmol/L (ref 96–106)
Creatinine, Ser: 1.16 mg/dL (ref 0.76–1.27)
GFR calc Af Amer: 69 mL/min/{1.73_m2} (ref >59)
GFR calc non Af Amer: 60 mL/min/{1.73_m2} (ref >59)
Globulin, Total: 2 g/dL (ref 1.5–4.5)
Glucose: 97 mg/dL (ref 65–99)
Potassium: 5.2 mmol/L (ref 3.5–5.2)
Sodium: 142 mmol/L (ref 134–144)
Total Protein: 6.9 g/dL (ref 6.0–8.5)

## 2018-12-11 LAB — INSULIN, RANDOM: INSULIN: 15.4 u[IU]/mL (ref 2.6–24.9)

## 2018-12-11 LAB — VITAMIN D 25 HYDROXY (VIT D DEFICIENCY, FRACTURES): VIT D 25 HYDROXY: 78.4 ng/mL (ref 30.0–100.0)

## 2018-12-11 LAB — T3: T3, Total: 90 ng/dL (ref 71–180)

## 2018-12-11 LAB — T4, FREE: Free T4: 1.09 ng/dL (ref 0.82–1.77)

## 2018-12-11 NOTE — Telephone Encounter (Signed)
It is okay to hold apixaban for 2 days prior to dental procedure and then restart when felt safe to do so by Dr. Nicki Reaper.  While off apixaban, I recommend that Philip [name] take aspirin 81 mg daily.  Philip Bush, MD [facility] Pager: [phone]

## 2018-12-11 NOTE — Telephone Encounter (Signed)
Copied from [place] [id number]. Topic: Quick Communication - Rx Refill/Question >> Dec 11, 2018  2:56 PM Alanda Slim E wrote: Medication: ELIQUIS 5 MG TABS tablet  Stephanie From Dr. Percell Miller [place](dental) office called and stated Pt will need to have an emergency extraction and before they schedule they needed to know the directions for coming off of the ELIQUIS 5 MG TABS tablet and when Pt can start the medication again. / please advise

## 2018-12-11 NOTE — Telephone Encounter (Signed)
Will send note to cardiology

## 2018-12-11 NOTE — Telephone Encounter (Signed)
Appreciate cardiology help.  Please notify Dr. Moss Mc about recommendations.

## 2018-12-11 NOTE — Telephone Encounter (Signed)
Please advise 

## 2018-12-11 NOTE — Telephone Encounter (Signed)
Spoke w/ Philip [name], informed of cardiology recommendations. She will inform Pt and Dr. Nicki Reaper.

## 2018-12-21 DIAGNOSIS — L570 Actinic keratosis: Secondary | ICD-10-CM | POA: Diagnosis not present

## 2019-01-07 ENCOUNTER — Ambulatory Visit (INDEPENDENT_AMBULATORY_CARE_PROVIDER_SITE_OTHER): Payer: Medicare HMO | Admitting: Family Medicine

## 2019-01-07 ENCOUNTER — Encounter (INDEPENDENT_AMBULATORY_CARE_PROVIDER_SITE_OTHER): Payer: Self-pay | Admitting: Family Medicine

## 2019-01-07 VITALS — BP 133/70 | HR 68 | Ht 69.0 in | Wt 208.0 lb

## 2019-01-07 DIAGNOSIS — E559 Vitamin D deficiency, unspecified: Secondary | ICD-10-CM

## 2019-01-07 DIAGNOSIS — R7303 Prediabetes: Secondary | ICD-10-CM | POA: Diagnosis not present

## 2019-01-07 DIAGNOSIS — Z6830 Body mass index (BMI) 30.0-30.9, adult: Secondary | ICD-10-CM | POA: Diagnosis not present

## 2019-01-07 DIAGNOSIS — E669 Obesity, unspecified: Secondary | ICD-10-CM | POA: Diagnosis not present

## 2019-01-07 NOTE — Progress Notes (Signed)
Office: [phone]  /  Fax: [phone]   HPI:   Chief Complaint: OBESITY Philip [name] is here to discuss his progress with his obesity treatment plan. He is keeping a food journal with 1700 calories and 90+ grams of protein and is following his eating plan approximately 100 % of the time. He states he is going to the gym and walking 45 to 60 minutes 3 times per week. Philip [name] states that he has been journaling regularly, but then has been increasing celebration eating and eating out, as his calorie counts may be off. He is exercising and feels well overall.  His weight is 208 lb (94.3 kg) today and has had a weight gain of 4 pounds over a period of 4 weeks since her last visit. He has lost 22 lbs since starting treatment with [place].  Vitamin D deficiency Holly has a diagnosis of vitamin D deficiency. He is not currently on prescription vit D anymore as his level is at goal. He is at risk of over-replacement and his fatigue is greatly improved.  Pre-Diabetes Philip [name] has a diagnosis of pre-diabetes based on his elevated Hgb A1c of 5.8 on 12/10/18 and was informed this puts him at greater risk of developing diabetes. He is stable on his diet and exercise to decrease risk of diabetes. He denies hypoglycemia.  ASSESSMENT AND PLAN:  Vitamin D deficiency  Prediabetes  Class 1 obesity with serious comorbidity and body mass index (BMI) of 30.0 to 30.9 in adult, unspecified obesity type  PLAN:  Vitamin D Deficiency Philip [name] was informed that low vitamin D levels contributes to fatigue and are associated with obesity, breast, and colon cancer. He agrees to continue to take his multivitamin and we will recheck labs in 3 months. He was informed of the risk of over-replacement of vitamin D and agrees to not increase his dose unless he discusses this with [place] first. Philip [name] agrees to follow up in 4 to 6 weeks.  Pre-Diabetes Philip [name] will continue to work on weight loss, exercise, and decreasing simple  carbohydrates in his diet to help decrease the risk of diabetes. He was informed that eating too many simple carbohydrates or too many calories at one sitting increases the likelihood of GI side effects. Philip [name] agreed to continue diet and exercise and we will recheck labs in 3 months. Philip [name] agreed to follow up with [place] as directed to monitor his progress.  I spent > than 50% of the 25 minute visit on counseling as documented in the note.  Obesity Philip [name] is currently in the action stage of change. As such, his goal is to continue with weight loss efforts. He has agreed to keep a food journal with 1700 calories and 90 grams of protein.  Philip [name] has been instructed to work up to a goal of 150 minutes of combined cardio and strengthening exercise per week for weight loss and overall health benefits. We discussed the following Behavioral Modification Strategies today: increasing lean protein intake, decreasing simple carbohydrates, and work on meal planning and easy cooking plans.  Philip [name] has agreed to follow up with our clinic in 4 to 6 weeks. He was informed of the importance of frequent follow up visits to maximize his success with intensive lifestyle modifications for his multiple health conditions.  ALLERGIES: No Known Allergies  MEDICATIONS: Current Outpatient Medications on File Prior to Visit  Medication Sig Dispense Refill  . amLODipine (NORVASC) 5 MG tablet TAKE 1 TABLET (5 MG TOTAL) BY MOUTH DAILY. 90 tablet 3  .  atorvastatin (LIPITOR) 40 MG tablet TAKE 1 TABLET (40 MG TOTAL) BY MOUTH AT BEDTIME. 90 tablet 2  . B Complex-Biotin-FA (B-COMPLEX PO) Take 1 tablet by mouth daily.    Philip [name] Kitchen buPROPion (WELLBUTRIN SR) 150 MG 12 hr tablet Take 1 tablet (150 mg total) by mouth daily. 30 tablet 0  . Cholecalciferol (VITAMIN D3) 5000 UNITS CAPS Take 5,000 Units by mouth daily.    Philip [name] Kitchen co-enzyme Q-10 30 MG capsule Take 30 mg by mouth daily.    Philip [name] Kitchen ELIQUIS 5 MG TABS tablet TAKE 1 TABLET TWICE DAILY 180 tablet  1  . hydrocortisone 2.5 % cream Apply 1 application topically as needed.    Philip [name] Kitchen levothyroxine (SYNTHROID, LEVOTHROID) 25 MCG tablet Take 1 tablet (25 mcg total) by mouth daily before breakfast. 30 tablet 0  . lisinopril (PRINIVIL,ZESTRIL) 10 MG tablet TAKE 1 TABLET EVERY DAY 90 tablet 3  . magnesium oxide (MAG-OX) 400 MG tablet Take 800 mg by mouth daily.    . Melatonin 10 MG CAPS Take 10 mg at bedtime by mouth.    . Multiple Vitamins-Minerals (MULTIVITAMIN WITH MINERALS) tablet Take 1 tablet by mouth daily.    . Omega-3 Fatty Acids (OMEGA-3 FISH OIL) 1200 MG CAPS Take 2 1,200 mg capsules daily 90 capsule 3  . polyethylene glycol (MIRALAX / GLYCOLAX) packet Take 17 g by mouth daily.     No current facility-administered medications on file prior to visit.     PAST MEDICAL HISTORY: Past Medical History:  Diagnosis Date  . AAA (abdominal aortic aneurysm) ([place])   . Atrial fibrillation ([place])   . CAD (coronary artery disease)    had a MI , s/p CABG  . Cataracts, bilateral    immature  . CHF (congestive heart failure) ([place])   . Dysrhythmia    paroximal a fib  . GERD (gastroesophageal reflux disease)    takes Omeprazole daily  . History of colon polyps   . History of kidney stones   . History of shingles   . Hyperlipidemia   . Hypertension    takes Amlodipine,Metoprolol,and Lisinopril daily  . Joint pain   . Joint swelling   . Myocardial infarction [facility])    several with last one being 2001(but never knew about any except 2001)  . OSA (obstructive sleep apnea)    started CPAP 12/09  . Peripheral vascular disease ([place])   . Pneumonia    as a child  . Skin cancer    several , one of them was melanoma (sees derm routinely)  . Tingling    feet occasionally  . Tinnitus     PAST SURGICAL HISTORY: Past Surgical History:  Procedure Laterality Date  . ABDOMINAL AORTAGRAM N/A 06/16/2014   Procedure: ABDOMINAL Maxcine Ham;  Surgeon: Angelia Mould, MD;  Location: [facility] CATH LAB;   Service: Cardiovascular;  Laterality: N/A;  . ABDOMINAL AORTIC ENDOVASCULAR STENT GRAFT N/A 07/08/2014   Procedure: ABDOMINAL AORTIC ENDOVASCULAR STENT GRAFT/ GORE;  Surgeon: Angelia Mould, MD;  Location: [facility];  Service: Vascular;  Laterality: N/A;  . CARDIAC CATHETERIZATION  2015  . COLONOSCOPY    . CORONARY ARTERY BYPASS GRAFT  1999   x 3 vessels  . LEFT HEART CATHETERIZATION WITH CORONARY/GRAFT ANGIOGRAM N/A 05/29/2014   Procedure: LEFT HEART CATHETERIZATION WITH [name];  Surgeon: [name], MD;  Location: [facility] CATH LAB;  Service: Cardiovascular;  Laterality: N/A;  . PENILE PROSTHESIS IMPLANT  08/2005  . RHINOPLASTY  1975    SOCIAL HISTORY: Social  History   Tobacco Use  . Smoking status: Former Smoker    Types: Cigarettes    Last attempt to quit: 1979    Years since quitting: 41.2  . Smokeless tobacco: Never Used  . Tobacco comment: quit smoking in 1979  Substance Use Topics  . Alcohol use: Yes    Comment: occasionally - once per week  . Drug use: No    FAMILY HISTORY: Family History  Problem Relation Age of Onset  . Prostate cancer Father 58  . Heart disease Father   . Skin cancer Father   . Heart attack Father   . Cancer Father   . Deep vein thrombosis Father   . Hyperlipidemia Father   . Hypertension Father   . Heart disease Mother   . Skin cancer Mother   . Heart attack Mother   . Cancer Mother   . Hyperlipidemia Mother   . Hypertension Mother   . Varicose Veins Mother   . Peripheral vascular disease Mother        amputation  . Sleep apnea Brother   . Hyperlipidemia Brother   . Hypertension Brother   . Prostate cancer Brother 26  . Colon cancer Other        aunt  . Skin cancer Brother   . Skin cancer Sister   . Cancer Sister   . Heart disease Sister   . Diabetes Neg Hx   . Stroke Neg Hx     ROS: Review of Systems  Constitutional: Positive for malaise/fatigue. Negative for weight loss.  Endo/Heme/Allergies:        Negative for hypoglycemia.    PHYSICAL EXAM: Blood pressure 133/70, pulse 68, height 5\' 9"  (1.753 m), weight 208 lb (94.3 kg), SpO2 98 %. Body mass index is 30.72 kg/m. Physical Exam Vitals signs reviewed.  Constitutional:      Appearance: Normal appearance. He is obese.  Cardiovascular:     Rate and Rhythm: Normal rate.  Pulmonary:     Effort: Pulmonary effort is normal.  Musculoskeletal: Normal range of motion.  Skin:    General: Skin is warm and dry.  Neurological:     Mental Status: He is alert and oriented to person, place, and time.  Psychiatric:        Mood and Affect: Mood normal.        Behavior: Behavior normal.     RECENT LABS AND TESTS: BMET    Component Value Date/Time   NA 142 12/10/2018 1353   K 5.2 12/10/2018 1353   CL 101 12/10/2018 1353   CO2 26 12/10/2018 1353   GLUCOSE 97 12/10/2018 1353   GLUCOSE 99 01/01/2018 1017   BUN 18 12/10/2018 1353   CREATININE 1.16 12/10/2018 1353   CREATININE 1.08 12/14/2015 1122   CALCIUM 9.9 12/10/2018 1353   GFRNONAA 60 12/10/2018 1353   GFRAA 69 12/10/2018 1353   Lab Results  Component Value Date   HGBA1C 5.8 (H) 12/10/2018   HGBA1C 5.8 (H) 06/06/2018   HGBA1C 5.6 08/14/2017   HGBA1C 5.7 (H) 04/19/2017   HGBA1C 5.9 (H) 01/18/2017   Lab Results  Component Value Date   INSULIN 15.4 12/10/2018   INSULIN 9.6 06/06/2018   INSULIN 10.8 08/14/2017   INSULIN 17.3 04/19/2017   INSULIN 16.1 01/18/2017   CBC    Component Value Date/Time   WBC 4.4 06/06/2018 0000   WBC 4.6 01/01/2018 1017   RBC 4.55 06/06/2018 0000   RBC 4.77 01/01/2018 1017   HGB 14.6  06/06/2018 0000   HCT 43.0 06/06/2018 0000   PLT 147 (L) 06/06/2018 0000   MCV 95 06/06/2018 0000   MCH 32.1 06/06/2018 0000   MCH 31.1 12/14/2015 1122   MCHC 34.0 06/06/2018 0000   MCHC 34.1 01/01/2018 1017   RDW 14.2 06/06/2018 0000   LYMPHSABS 1.4 06/06/2018 0000   MONOABS 0.4 01/01/2018 1017   EOSABS 0.1 06/06/2018 0000   BASOSABS 0.0 06/06/2018  0000   Iron/TIBC/Ferritin/ %Sat No results found for: IRON, TIBC, FERRITIN, IRONPCTSAT Lipid Panel     Component Value Date/Time   CHOL 124 09/12/2018 0837   TRIG 137 09/12/2018 0837   TRIG 256 09/14/2009 0000   HDL 43 09/12/2018 0837   CHOLHDL 2.9 09/12/2018 0837   CHOLHDL 3 12/07/2016 0852   VLDL 30.8 12/07/2016 0852   LDLCALC 54 09/12/2018 0837   LDLDIRECT 68.0 04/13/2015 0823   Hepatic Function Panel     Component Value Date/Time   PROT 6.9 12/10/2018 1353   ALBUMIN 4.9 (H) 12/10/2018 1353   AST 31 12/10/2018 1353   ALT 34 12/10/2018 1353   ALKPHOS 73 12/10/2018 1353   BILITOT 0.6 12/10/2018 1353      Component Value Date/Time   TSH 3.250 12/10/2018 1353   TSH 4.140 08/06/2018 1148   TSH 4.010 01/31/2018 1158   Results for Philip [name], Philip [name] (MRN [id number]) as of 01/07/2019 15:21  Ref. Range 12/10/2018 13:53  Vitamin D, 25-Hydroxy Latest Ref Range: 30.0 - 100.0 ng/mL 78.4   OBESITY BEHAVIORAL INTERVENTION VISIT  Today's visit was # 35  Starting weight: 230 lbs Starting date: 01/18/17 Today's weight : Weight: 208 lb (94.3 kg)  Today's date: 01/07/2019 Total lbs lost to date: 22    01/07/2019  Height 5\' 9"  (1.753 m)  Weight 208 lb (94.3 kg)  BMI (Calculated) 30.7  BLOOD PRESSURE - SYSTOLIC 703  BLOOD PRESSURE - DIASTOLIC 70   Body Fat % 50.0 %  Total Body Water (lbs) 102.8 lbs    ASK: We discussed the diagnosis of obesity with Philip [name] today and Philip [name] agreed to give [place] permission to discuss obesity behavioral modification therapy today.  ASSESS: Philip [name] has the diagnosis of obesity and his BMI today is 30.7. [place] is in the action stage of change.  ADVISE: Philip [name] was educated on the multiple health risks of obesity as well as the benefit of weight loss to improve his health. He was advised of the need for long term treatment and the importance of lifestyle modifications to improve his current health and to decrease his risk of future health  problems.  AGREE: Multiple dietary modification options and treatment options were discussed and Philip [name] agreed to follow the recommendations documented in the above note.  ARRANGE: Philip [name] was educated on the importance of frequent visits to treat obesity as outlined per CMS and USPSTF guidelines and agreed to schedule his next follow up appointment today.  IMarcille Blanco, CMA, am acting as transcriptionist for Starlyn Skeans, MD I have reviewed the above documentation for accuracy and completeness, and I agree with the above. -[name], MD

## 2019-01-17 ENCOUNTER — Telehealth: Payer: Self-pay

## 2019-01-17 DIAGNOSIS — R5383 Other fatigue: Secondary | ICD-10-CM

## 2019-01-17 DIAGNOSIS — R0609 Other forms of dyspnea: Secondary | ICD-10-CM

## 2019-01-17 DIAGNOSIS — I2510 Atherosclerotic heart disease of native coronary artery without angina pectoris: Secondary | ICD-10-CM

## 2019-01-17 MED ORDER — ATORVASTATIN CALCIUM 40 MG PO TABS: 40.0000 mg | ORAL_TABLET | Freq: Every day | ORAL | 1 refills | Status: DC

## 2019-01-17 NOTE — Telephone Encounter (Signed)
Left message for patient to call back and discuss appointment scheduled with Dr. Irish Lack on 3/23.

## 2019-01-17 NOTE — Telephone Encounter (Signed)
Appointment Cancelled due to Coronavirus:  Called patient in regards to 6 mo f/u appointment with Dr. Irish Lack (transitioning from Dr. Saunders Revel) on 01/21/19.   Patient denies having any chest pain, SOB, increasing edema, wt gain, or increase in abdominal girth, syncope, or any other Sx.  Patient was okay with cancelling appointment and has been made aware that they will be contacted in the near future to reschedule.   Patient understands to let us know if they develop any Sx before then.  Refills sent in.    Will route to "CV DIV C19 CANCEL" pool.

## 2019-01-21 ENCOUNTER — Ambulatory Visit: Payer: Medicare HMO | Admitting: Interventional Cardiology

## 2019-01-22 ENCOUNTER — Encounter (INDEPENDENT_AMBULATORY_CARE_PROVIDER_SITE_OTHER): Payer: Self-pay

## 2019-01-28 ENCOUNTER — Encounter (INDEPENDENT_AMBULATORY_CARE_PROVIDER_SITE_OTHER): Payer: Self-pay

## 2019-02-01 ENCOUNTER — Telehealth: Payer: Self-pay | Admitting: Interventional Cardiology

## 2019-02-01 NOTE — Telephone Encounter (Signed)
Pt called back returning Brittany's call.

## 2019-02-01 NOTE — Telephone Encounter (Signed)
Patient's appointment time changed to 2:00 PM per patient's request.

## 2019-02-01 NOTE — Telephone Encounter (Signed)
Virtual Visit Pre-Appointment Phone Call    TELEPHONE CALL NOTE  Philip [name] has been deemed a candidate for a follow-up tele-health visit to limit community exposure during the Covid-19 pandemic. I spoke with the patient via phone to ensure availability of phone/video source, confirm preferred email & phone number, and discuss instructions and expectations.  I reminded DEWAIN PLATZ to be prepared with any vital sign and/or heart rhythm information that could potentially be obtained via home monitoring, at the time of his visit. I reminded FAIZ WEBER to expect a phone call at the time of his visit if his visit.  Did the patient verbally acknowledge consent to treatment? YES  Patient scheduled for VIDEO Visit on 4/6  Cleon Gustin, [place] 02/01/2019 3:38 PM   DOWNLOADING Victor, go to [employer] and type in WebEx in the search bar. [employer] [employer], the blue/green circle. The app is free but as with any other app downloads, their phone may require them to verify saved payment information or Apple password. The patient does NOT have to create an account.  - If Android, ask patient to go to [employer] and type in WebEx in the search bar. [place] [employer], the blue/green circle. The app is free but as with any other app downloads, their phone may require them to verify saved payment information or Android password. The patient does NOT have to create an account.   CONSENT FOR TELE-HEALTH VISIT - PLEASE REVIEW  I hereby voluntarily request, consent and authorize [facility] and its employed or contracted physicians, physician assistants, nurse practitioners or other licensed health care professionals (the Practitioner), to provide me with telemedicine health care services (the "Services") as deemed necessary by the treating Practitioner. I acknowledge and consent to receive the Services by the Practitioner via  telemedicine. I understand that the telemedicine visit will involve communicating with the Practitioner through live audiovisual communication technology and the disclosure of certain medical information by electronic transmission. I acknowledge that I have been given the opportunity to request an in-person assessment or other available alternative prior to the telemedicine visit and am voluntarily participating in the telemedicine visit.  I understand that I have the right to withhold or withdraw my consent to the use of telemedicine in the course of my care at any time, without affecting my right to future care or treatment, and that the Practitioner or I may terminate the telemedicine visit at any time. I understand that I have the right to inspect all information obtained and/or recorded in the course of the telemedicine visit and may receive copies of available information for a reasonable fee.  I understand that some of the potential risks of receiving the Services via telemedicine include:  Marland Kitchen Delay or interruption in medical evaluation due to technological equipment failure or disruption; . Information transmitted may not be sufficient (e.g. poor resolution of images) to allow for appropriate medical decision making by the Practitioner; and/or  . In rare instances, security protocols could fail, causing a breach of personal health information.  Furthermore, I acknowledge that it is my responsibility to provide information about my medical history, conditions and care that is complete and accurate to the best of my ability. I acknowledge that Practitioner's advice, recommendations, and/or decision may be based on factors not within their control, such as incomplete or inaccurate data provided by me or distortions of diagnostic images or  specimens that may result from electronic transmissions. I understand that the practice of medicine is not an exact science and that Practitioner makes no warranties or  guarantees regarding treatment outcomes. I acknowledge that I will receive a copy of this consent concurrently upon execution via email to the email address I last provided but may also request a printed copy by calling the office of [place].    I understand that my insurance will be billed for this visit.   I have read or had this consent read to me. . I understand the contents of this consent, which adequately explains the benefits and risks of the Services being provided via telemedicine.  . I have been provided ample opportunity to ask questions regarding this consent and the Services and have had my questions answered to my satisfaction. . I give my informed consent for the services to be provided through the use of telemedicine in my medical care  By participating in this telemedicine visit I agree to the above.

## 2019-02-01 NOTE — Telephone Encounter (Signed)
Follow Up:    Pt says he needs to talk to you about his appt on Monday please.

## 2019-02-03 NOTE — Progress Notes (Signed)
Virtual Visit via Video Note   This visit type was conducted due to national recommendations for restrictions regarding the COVID-19 Pandemic (e.g. social distancing) in an effort to limit this patient's exposure and mitigate transmission in our community.  Due to his co-morbid illnesses, this patient is at least at moderate risk for complications without adequate follow up.  This format is felt to be most appropriate for this patient at this time.  All issues noted in this document were discussed and addressed.  A limited physical exam was performed with this format.  Please refer to the patient's chart for his consent to telehealth for [facility].  Evaluation Performed:  Follow-up visit  This visit type was conducted due to national recommendations for restrictions regarding the COVID-19 Pandemic (e.g. social distancing).  This format is felt to be most appropriate for this patient at this time.  All issues noted in this document were discussed and addressed.  No physical exam was performed (except for noted visual exam findings with Video Visits).  Please refer to the patient's chart (MyChart message for video visits and phone note for telephone visits) for the patient's consent to telehealth for [facility].  Date:  02/04/2019   ID:  Philip [name], DOB [birth year], MRN [id number]  Patient Location:  Home  Provider location:   [place]  PCP:  Colon Branch, MD  Cardiologist:  Nelva Bush, [place] Electrophysiologist:  None   Chief Complaint:  CAD  History of Present Illness:    Philip [name] is a 78 y.o. male who presents via audio/video conferencing for a telehealth visit today.  Philip [name] is a 78 y.o. year-old male with history of CAD status post CABG (1999), AAA status post endovascular repair (2015), paroxysmal atrial fibrillation, HTN, hyperlipidemia, and CKD stage III, who presents for follow-up of coronary artery disease and PAF.  Dr. Saunders Revel last saw him in  February 2019, at which time he was doing well other than mild leg edema and modest weight gain.  He subsequently obtained an echocardiogram, which showed preserved LV systolic function with grade 1 diastolic dysfunction.   Never had palpitations when he had AFib in the past.   His wife had orthopedic surgery in 2019.   H/o PAF so has been on lifelong anticoagulation due to CHADS-vasc of 4.   Since the virus, he is not going to the gym.  He has not been exercising at home.  No significant bleeding problems, except for some nuisance bruising.   Denies : Chest pain. Dizziness. Leg edema. Nitroglycerin use. Orthopnea. Palpitations. Paroxysmal nocturnal dyspnea. Shortness of breath. Syncope.   Walking daily, 8K-12K steps daily.  The patient does not have symptoms concerning for COVID-19 infection (fever, chills, cough, or new shortness of breath).    Prior CV studies:   The following studies were reviewed today:  LDL 54 in 11/19; Cr, LFTs well controlled in 12/2018  Past Medical History:  Diagnosis Date   AAA (abdominal aortic aneurysm) ([place])    Atrial fibrillation (HCC)    CAD (coronary artery disease)    had a MI , s/p CABG   Cataracts, bilateral    immature   CHF (congestive heart failure) ([place])    Dysrhythmia    paroximal a fib   GERD (gastroesophageal reflux disease)    takes Omeprazole daily   History of colon polyps    History of kidney stones    History of shingles    Hyperlipidemia  Hypertension    takes Amlodipine,Metoprolol,and Lisinopril daily   Joint pain    Joint swelling    Myocardial infarction [facility])    several with last one being 2001(but never knew about any except 2001)   OSA (obstructive sleep apnea)    started CPAP 12/09   Peripheral vascular disease ([place])    Pneumonia    as a child   Skin cancer    several , one of them was melanoma (sees derm routinely)   Tingling    feet occasionally   Tinnitus    Past Surgical History:   Procedure Laterality Date   ABDOMINAL AORTAGRAM N/A 06/16/2014   Procedure: ABDOMINAL Maxcine Ham;  Surgeon: Angelia Mould, MD;  Location: [facility] LAB;  Service: Cardiovascular;  Laterality: N/A;   ABDOMINAL AORTIC ENDOVASCULAR STENT GRAFT N/A 07/08/2014   Procedure: ABDOMINAL AORTIC ENDOVASCULAR STENT GRAFT/ GORE;  Surgeon: Angelia Mould, MD;  Location: [place];  Service: Vascular;  Laterality: N/A;   CARDIAC CATHETERIZATION  2015   COLONOSCOPY     CORONARY ARTERY BYPASS GRAFT  1999   x 3 vessels   LEFT HEART CATHETERIZATION WITH CORONARY/GRAFT ANGIOGRAM N/A 05/29/2014   Procedure: LEFT HEART CATHETERIZATION WITH [name];  Surgeon: [name], MD;  Location: [facility] CATH LAB;  Service: Cardiovascular;  Laterality: N/A;   PENILE PROSTHESIS IMPLANT  08/2005   RHINOPLASTY  1975     Current Meds  Medication Sig   amLODipine (NORVASC) 5 MG tablet TAKE 1 TABLET (5 MG TOTAL) BY MOUTH DAILY.   atorvastatin (LIPITOR) 40 MG tablet Take 1 tablet (40 mg total) by mouth at bedtime.   B Complex-Biotin-FA (B-COMPLEX PO) Take 1 tablet by mouth daily.   buPROPion (WELLBUTRIN SR) 150 MG 12 hr tablet Take 1 tablet (150 mg total) by mouth daily.   Cholecalciferol (VITAMIN D3) 5000 UNITS CAPS Take 5,000 Units by mouth daily.   co-enzyme Q-10 30 MG capsule Take 30 mg by mouth daily.   ELIQUIS 5 MG TABS tablet TAKE 1 TABLET TWICE DAILY   hydrocortisone 2.5 % cream Apply 1 application topically as needed.   levothyroxine (SYNTHROID, LEVOTHROID) 25 MCG tablet Take 1 tablet (25 mcg total) by mouth daily before breakfast.   lisinopril (PRINIVIL,ZESTRIL) 10 MG tablet TAKE 1 TABLET EVERY DAY   magnesium oxide (MAG-OX) 400 MG tablet Take 800 mg by mouth daily.   Melatonin 10 MG CAPS Take 10 mg at bedtime by mouth.   Multiple Vitamins-Minerals (MULTIVITAMIN WITH MINERALS) tablet Take 1 tablet by mouth daily.   Omega-3 Fatty Acids (OMEGA-3 FISH OIL) 1200 MG CAPS Take 2  1,200 mg capsules daily   polyethylene glycol (MIRALAX / GLYCOLAX) packet Take 17 g by mouth daily.     Allergies:   Patient has no known allergies.   Social History   Tobacco Use   Smoking status: Former Smoker    Types: Cigarettes    Last attempt to quit: 1979    Years since quitting: 41.2   Smokeless tobacco: Never Used   Tobacco comment: quit smoking in 1979  Substance Use Topics   Alcohol use: Yes    Comment: occasionally - once per week   Drug use: No     Family Hx: The patient's family history includes Cancer in his father, mother, and sister; Colon cancer in an other family member; Deep vein thrombosis in his father; Heart attack in his father and mother; Heart disease in his father, mother, and sister; Hyperlipidemia in his brother,  father, and mother; Hypertension in his brother, father, and mother; Peripheral vascular disease in his mother; Prostate cancer (age of onset: 83) in his father; Prostate cancer (age of onset: 47) in his brother; Skin cancer in his brother, father, mother, and sister; Sleep apnea in his brother; Varicose Veins in his mother. There is no history of Diabetes or Stroke.  ROS:   Please see the history of present illness.     All other systems reviewed and are negative.   Labs/Other Tests and Data Reviewed:    Recent Labs: 06/06/2018: Hemoglobin 14.6; Platelets 147 12/10/2018: ALT 34; BUN 18; Creatinine, Ser 1.16; Potassium 5.2; Sodium 142; TSH 3.250   Recent Lipid Panel Lab Results  Component Value Date/Time   CHOL 124 09/12/2018 08:37 AM   TRIG 137 09/12/2018 08:37 AM   TRIG 256 09/14/2009 12:00 AM   HDL 43 09/12/2018 08:37 AM   CHOLHDL 2.9 09/12/2018 08:37 AM   CHOLHDL 3 12/07/2016 08:52 AM   LDLCALC 54 09/12/2018 08:37 AM   LDLDIRECT 68.0 04/13/2015 08:23 AM    Wt Readings from Last 3 Encounters:  02/04/19 201 lb (91.2 kg)  01/07/19 208 lb (94.3 kg)  12/10/18 204 lb (92.5 kg)     Objective:    Vital Signs:  BP 121/75     Pulse 67    Ht 5\' 9"  (1.753 m)    Wt 201 lb (91.2 kg)    BMI 29.68 kg/m    Well nourished, well developed male in no acute distress. No shortness of breath  ASSESSMENT & PLAN:    1.  CAD: No angina.  COntinue aggressive secondary prevention.  Healthy diet and exercise recommended.   2.  PAF: Eliquis for stroke prevention.  No palpitations. No significant bleeding issues noted. Eliquis labs q 6 months.   3. HTN: The current medical regimen is effective;  continue present plan and medications.  4. Hyperlipiemia: The current medical regimen is effective;  continue present plan and medications.  5.  PAD: AAA repaired.  He has routine ultrasounds to follow.   COVID-19 Education: The signs and symptoms of COVID-19 were discussed with the patient and how to seek care for testing (follow up with PCP or arrange E-visit).  The importance of social distancing was discussed today.  Patient Risk:   After full review of this patient's clinical status, I feel that they are at least moderate risk at this time.  Time:   Today, I have spent 25 minutes with the patient with telehealth technology discussing CAD.     Medication Adjustments/Labs and Tests Ordered: Current medicines are reviewed at length with the patient today.  Concerns regarding medicines are outlined above.  Tests Ordered: No orders of the defined types were placed in this encounter.  Medication Changes: No orders of the defined types were placed in this encounter.   Disposition:  Follow up in 1 year(s)  Signed, Larae Grooms, MD  02/04/2019 2:11 PM    [place]

## 2019-02-04 ENCOUNTER — Other Ambulatory Visit: Payer: Self-pay

## 2019-02-04 ENCOUNTER — Encounter: Payer: Self-pay | Admitting: Interventional Cardiology

## 2019-02-04 ENCOUNTER — Telehealth (INDEPENDENT_AMBULATORY_CARE_PROVIDER_SITE_OTHER): Payer: Medicare HMO | Admitting: Interventional Cardiology

## 2019-02-04 ENCOUNTER — Encounter (INDEPENDENT_AMBULATORY_CARE_PROVIDER_SITE_OTHER): Payer: Self-pay | Admitting: Family Medicine

## 2019-02-04 ENCOUNTER — Ambulatory Visit (INDEPENDENT_AMBULATORY_CARE_PROVIDER_SITE_OTHER): Payer: Medicare HMO | Admitting: Family Medicine

## 2019-02-04 DIAGNOSIS — E669 Obesity, unspecified: Secondary | ICD-10-CM | POA: Diagnosis not present

## 2019-02-04 DIAGNOSIS — I2510 Atherosclerotic heart disease of native coronary artery without angina pectoris: Secondary | ICD-10-CM | POA: Diagnosis not present

## 2019-02-04 DIAGNOSIS — Z6830 Body mass index (BMI) 30.0-30.9, adult: Secondary | ICD-10-CM | POA: Diagnosis not present

## 2019-02-04 DIAGNOSIS — F3289 Other specified depressive episodes: Secondary | ICD-10-CM | POA: Diagnosis not present

## 2019-02-04 DIAGNOSIS — E038 Other specified hypothyroidism: Secondary | ICD-10-CM | POA: Diagnosis not present

## 2019-02-04 DIAGNOSIS — E782 Mixed hyperlipidemia: Secondary | ICD-10-CM | POA: Diagnosis not present

## 2019-02-04 DIAGNOSIS — I714 Abdominal aortic aneurysm, without rupture, unspecified: Secondary | ICD-10-CM

## 2019-02-04 DIAGNOSIS — I480 Paroxysmal atrial fibrillation: Secondary | ICD-10-CM

## 2019-02-04 MED ORDER — LEVOTHYROXINE SODIUM 25 MCG PO TABS: 25.0000 ug | ORAL_TABLET | Freq: Every day | ORAL | 0 refills | Status: DC

## 2019-02-04 MED ORDER — BUPROPION HCL ER (SR) 150 MG PO TB12: 150.0000 mg | ORAL_TABLET | Freq: Every day | ORAL | 0 refills | Status: DC

## 2019-02-04 NOTE — Patient Instructions (Signed)

## 2019-02-04 NOTE — Progress Notes (Signed)
Office: [phone]  /  Fax: [phone] TeleHealth Visit:  ALVEN ALVERIO has verbally consented to this TeleHealth visit today. The patient is located at home, the provider is located at the [employer] [facility] office. The participants in this visit include the listed provider and patient. Thinh was unable to use Face Time today and the Telehealth visit was conducted via telephone.  HPI:   Chief Complaint: OBESITY Jeren is here to discuss his progress with his obesity treatment plan. He is keeping a food journal with 1700 calories and 90 grams of protein and is following his eating plan approximately 90 to 95 % of the time. He states he is walking 8,000 to 12,000 steps 7 times per week. Joy is doing well with journaling. He is still active and doing gardening, but is unable to go to the gym during sequestration. He thinks that he has lost another 7 pounds since our last visit.  We were unable to weigh the patient today for this TeleHealth visit. He feels as if he has lost weight since his last visit. He has lost 22 lbs since starting treatment with [place].  Depression with emotional eating behaviors Rebel's mood is stable on Wellbutrin. His blood pressure has been stable and he notes that fatigue has improved. He feels good overall and has been successful avoiding emotional eating. He has been working on behavior modification techniques to help reduce his emotional eating and has been somewhat successful.   Hypothyroid Shandy has a diagnosis of hypothyroidism. He is on levothyroxine and his levels are well controlled with his current dose.  ASSESSMENT AND PLAN:  Other specified hypothyroidism - Plan: levothyroxine (SYNTHROID, LEVOTHROID) 25 MCG tablet  Other depression - with emotional eating - Plan: buPROPion (WELLBUTRIN SR) 150 MG 12 hr tablet  Class 1 obesity with serious comorbidity and body mass index (BMI) of 30.0 to 30.9 in adult, unspecified obesity type  PLAN:   Hypothyroid Belford was informed of the importance of good thyroid control to help with weight loss efforts. He was also informed that supertherapeutic thyroid levels are dangerous and will not improve weight loss results. Mandela agrees to continue levothyroxine 25 mcg before breakfast #30 with no refills. He agrees to follow up in 4 to 6 weeks.  Depression with Emotional Eating Behaviors We discussed behavior modification techniques today to help Watson deal with his emotional eating and depression. He has agreed to take Wellbutrin SR 150 mg qd #30 with no refills and agreed to follow up as directed.  Obesity Rutger is currently in the action stage of change. As such, his goal is to continue with weight loss efforts. He has agreed to keep a food journal with 1700 calories and 90 grams of protein.  Rakan has been instructed to continue his current walking 7 times per week. We discussed the following Behavioral Modification Strategies today: increasing lean protein intake, no skipping meals, keeping healthy foods in the home, and work on meal planning and easy cooking plans.  Wasim has agreed to follow up with our clinic in 4 to 6 weeks. He was informed of the importance of frequent follow up visits to maximize his success with intensive lifestyle modifications for his multiple health conditions.  ALLERGIES: No Known Allergies  MEDICATIONS: Current Outpatient Medications on File Prior to Visit  Medication Sig Dispense Refill  . amLODipine (NORVASC) 5 MG tablet TAKE 1 TABLET (5 MG TOTAL) BY MOUTH DAILY. 90 tablet 3  . atorvastatin (LIPITOR) 40 MG tablet  Take 1 tablet (40 mg total) by mouth at bedtime. 90 tablet 1  . B Complex-Biotin-FA (B-COMPLEX PO) Take 1 tablet by mouth daily.    . Cholecalciferol (VITAMIN D3) 5000 UNITS CAPS Take 5,000 Units by mouth daily.    Marland Kitchen co-enzyme Q-10 30 MG capsule Take 30 mg by mouth daily.    Marland Kitchen ELIQUIS 5 MG TABS tablet TAKE 1 TABLET TWICE DAILY 180 tablet 1   . hydrocortisone 2.5 % cream Apply 1 application topically as needed.    Marland Kitchen lisinopril (PRINIVIL,ZESTRIL) 10 MG tablet TAKE 1 TABLET EVERY DAY 90 tablet 3  . magnesium oxide (MAG-OX) 400 MG tablet Take 800 mg by mouth daily.    . Melatonin 10 MG CAPS Take 10 mg at bedtime by mouth.    . Multiple Vitamins-Minerals (MULTIVITAMIN WITH MINERALS) tablet Take 1 tablet by mouth daily.    . Omega-3 Fatty Acids (OMEGA-3 FISH OIL) 1200 MG CAPS Take 2 1,200 mg capsules daily 90 capsule 3  . polyethylene glycol (MIRALAX / GLYCOLAX) packet Take 17 g by mouth daily.     No current facility-administered medications on file prior to visit.     PAST MEDICAL HISTORY: Past Medical History:  Diagnosis Date  . AAA (abdominal aortic aneurysm) ([place])   . Atrial fibrillation ([place])   . CAD (coronary artery disease)    had a MI , s/p CABG  . Cataracts, bilateral    immature  . CHF (congestive heart failure) ([place])   . Dysrhythmia    paroximal a fib  . GERD (gastroesophageal reflux disease)    takes Omeprazole daily  . History of colon polyps   . History of kidney stones   . History of shingles   . Hyperlipidemia   . Hypertension    takes Amlodipine,Metoprolol,and Lisinopril daily  . Joint pain   . Joint swelling   . Myocardial infarction [facility])    several with last one being 2001(but never knew about any except 2001)  . OSA (obstructive sleep apnea)    started CPAP 12/09  . Peripheral vascular disease ([place])   . Pneumonia    as a child  . Skin cancer    several , one of them was melanoma (sees derm routinely)  . Tingling    feet occasionally  . Tinnitus     PAST SURGICAL HISTORY: Past Surgical History:  Procedure Laterality Date  . ABDOMINAL AORTAGRAM N/A 06/16/2014   Procedure: ABDOMINAL Maxcine Ham;  Surgeon: Angelia Mould, MD;  Location: [facility] CATH LAB;  Service: Cardiovascular;  Laterality: N/A;  . ABDOMINAL AORTIC ENDOVASCULAR STENT GRAFT N/A 07/08/2014   Procedure: ABDOMINAL AORTIC  ENDOVASCULAR STENT GRAFT/ GORE;  Surgeon: Angelia Mould, MD;  Location: [place];  Service: Vascular;  Laterality: N/A;  . CARDIAC CATHETERIZATION  2015  . COLONOSCOPY    . CORONARY ARTERY BYPASS GRAFT  1999   x 3 vessels  . LEFT HEART CATHETERIZATION WITH CORONARY/GRAFT ANGIOGRAM N/A 05/29/2014   Procedure: LEFT HEART CATHETERIZATION WITH [name];  Surgeon: [name], MD;  Location: [facility] CATH LAB;  Service: Cardiovascular;  Laterality: N/A;  . PENILE PROSTHESIS IMPLANT  08/2005  . RHINOPLASTY  1975    SOCIAL HISTORY: Social History   Tobacco Use  . Smoking status: Former Smoker    Types: Cigarettes    Last attempt to quit: 1979    Years since quitting: 41.2  . Smokeless tobacco: Never Used  . Tobacco comment: quit smoking in 1979  Substance Use Topics  .  Alcohol use: Yes    Comment: occasionally - once per week  . Drug use: No    FAMILY HISTORY: Family History  Problem Relation Age of Onset  . Prostate cancer Father 6  . Heart disease Father   . Skin cancer Father   . Heart attack Father   . Cancer Father   . Deep vein thrombosis Father   . Hyperlipidemia Father   . Hypertension Father   . Heart disease Mother   . Skin cancer Mother   . Heart attack Mother   . Cancer Mother   . Hyperlipidemia Mother   . Hypertension Mother   . Varicose Veins Mother   . Peripheral vascular disease Mother        amputation  . Sleep apnea Brother   . Hyperlipidemia Brother   . Hypertension Brother   . Prostate cancer Brother 40  . Colon cancer Other        aunt  . Skin cancer Brother   . Skin cancer Sister   . Cancer Sister   . Heart disease Sister   . Diabetes Neg Hx   . Stroke Neg Hx     ROS: Review of Systems  Psychiatric/Behavioral: Positive for depression.    PHYSICAL EXAM: Pt in no acute distress  RECENT LABS AND TESTS: BMET    Component Value Date/Time   NA 142 12/10/2018 1353   K 5.2 12/10/2018 1353   CL 101 12/10/2018 1353    CO2 26 12/10/2018 1353   GLUCOSE 97 12/10/2018 1353   GLUCOSE 99 01/01/2018 1017   BUN 18 12/10/2018 1353   CREATININE 1.16 12/10/2018 1353   CREATININE 1.08 12/14/2015 1122   CALCIUM 9.9 12/10/2018 1353   GFRNONAA 60 12/10/2018 1353   GFRAA 69 12/10/2018 1353   Lab Results  Component Value Date   HGBA1C 5.8 (H) 12/10/2018   HGBA1C 5.8 (H) 06/06/2018   HGBA1C 5.6 08/14/2017   HGBA1C 5.7 (H) 04/19/2017   HGBA1C 5.9 (H) 01/18/2017   Lab Results  Component Value Date   INSULIN 15.4 12/10/2018   INSULIN 9.6 06/06/2018   INSULIN 10.8 08/14/2017   INSULIN 17.3 04/19/2017   INSULIN 16.1 01/18/2017   CBC    Component Value Date/Time   WBC 4.4 06/06/2018 0000   WBC 4.6 01/01/2018 1017   RBC 4.55 06/06/2018 0000   RBC 4.77 01/01/2018 1017   HGB 14.6 06/06/2018 0000   HCT 43.0 06/06/2018 0000   PLT 147 (L) 06/06/2018 0000   MCV 95 06/06/2018 0000   MCH 32.1 06/06/2018 0000   MCH 31.1 12/14/2015 1122   MCHC 34.0 06/06/2018 0000   MCHC 34.1 01/01/2018 1017   RDW 14.2 06/06/2018 0000   LYMPHSABS 1.4 06/06/2018 0000   MONOABS 0.4 01/01/2018 1017   EOSABS 0.1 06/06/2018 0000   BASOSABS 0.0 06/06/2018 0000   Iron/TIBC/Ferritin/ %Sat No results found for: IRON, TIBC, FERRITIN, IRONPCTSAT Lipid Panel     Component Value Date/Time   CHOL 124 09/12/2018 0837   TRIG 137 09/12/2018 0837   TRIG 256 09/14/2009 0000   HDL 43 09/12/2018 0837   CHOLHDL 2.9 09/12/2018 0837   CHOLHDL 3 12/07/2016 0852   VLDL 30.8 12/07/2016 0852   LDLCALC 54 09/12/2018 0837   LDLDIRECT 68.0 04/13/2015 0823   Hepatic Function Panel     Component Value Date/Time   PROT 6.9 12/10/2018 1353   ALBUMIN 4.9 (H) 12/10/2018 1353   AST 31 12/10/2018 1353   ALT 34 12/10/2018 1353  ALKPHOS 73 12/10/2018 1353   BILITOT 0.6 12/10/2018 1353      Component Value Date/Time   TSH 3.250 12/10/2018 1353   TSH 4.140 08/06/2018 1148   TSH 4.010 01/31/2018 1158   Results for Sellinger, MARGARET STAGGS (MRN [id number])  as of 02/04/2019 11:01  Ref. Range 12/10/2018 13:53  Vitamin D, 25-Hydroxy Latest Ref Range: 30.0 - 100.0 ng/mL 78.4    I, Marcille Blanco, CMA, am acting as transcriptionist for Starlyn Skeans, MD I have reviewed the above documentation for accuracy and completeness, and I agree with the above. -[name], MD

## 2019-02-08 ENCOUNTER — Other Ambulatory Visit: Payer: Self-pay | Admitting: Internal Medicine

## 2019-02-08 DIAGNOSIS — I2510 Atherosclerotic heart disease of native coronary artery without angina pectoris: Secondary | ICD-10-CM

## 2019-02-08 NOTE — Telephone Encounter (Signed)
Scr 1.16 on 12/10/2018 Last OV 10/77 78 years old Rx for eliquis 5mg  BID sent to pharmacy

## 2019-02-08 NOTE — Telephone Encounter (Signed)
Refill request

## 2019-03-06 ENCOUNTER — Other Ambulatory Visit (INDEPENDENT_AMBULATORY_CARE_PROVIDER_SITE_OTHER): Payer: Self-pay | Admitting: Family Medicine

## 2019-03-06 ENCOUNTER — Encounter: Payer: Medicare HMO | Admitting: Internal Medicine

## 2019-03-06 DIAGNOSIS — F3289 Other specified depressive episodes: Secondary | ICD-10-CM

## 2019-03-06 DIAGNOSIS — E038 Other specified hypothyroidism: Secondary | ICD-10-CM

## 2019-03-06 MED ORDER — LEVOTHYROXINE SODIUM 25 MCG PO TABS: 25.0000 ug | ORAL_TABLET | Freq: Every day | ORAL | 0 refills | Status: DC

## 2019-03-06 MED ORDER — BUPROPION HCL ER (SR) 150 MG PO TB12: 150.0000 mg | ORAL_TABLET | Freq: Every day | ORAL | 0 refills | Status: DC

## 2019-03-11 ENCOUNTER — Ambulatory Visit (INDEPENDENT_AMBULATORY_CARE_PROVIDER_SITE_OTHER): Payer: Medicare HMO | Admitting: Family Medicine

## 2019-03-11 ENCOUNTER — Encounter (INDEPENDENT_AMBULATORY_CARE_PROVIDER_SITE_OTHER): Payer: Self-pay | Admitting: Family Medicine

## 2019-03-11 ENCOUNTER — Other Ambulatory Visit: Payer: Self-pay

## 2019-03-11 DIAGNOSIS — Z6830 Body mass index (BMI) 30.0-30.9, adult: Secondary | ICD-10-CM | POA: Diagnosis not present

## 2019-03-11 DIAGNOSIS — G4709 Other insomnia: Secondary | ICD-10-CM | POA: Diagnosis not present

## 2019-03-11 DIAGNOSIS — E669 Obesity, unspecified: Secondary | ICD-10-CM

## 2019-03-12 NOTE — Progress Notes (Signed)
Office: [phone]  /  Fax: [phone] TeleHealth Visit:  Philip [name] has verbally consented to this TeleHealth visit today. The patient is located at home, the provider is located at the [employer] [facility] office. The participants in this visit include the listed provider and patient. The visit was conducted today via Face Time.  HPI:   Chief Complaint: OBESITY Philip [name] is here to discuss his progress with his obesity treatment plan. He is on the keep a food journal with 1700 calories and 90 grams of protein and is following his eating plan approximately 90 % of the time. He states he is walking and playing golf 60 minutes 7 times per week. Philip [name] feels that he has done well maintaining his weight. He is still golfing 2 to 3 times per week, but misses going to the gym. Philip [name] has been walking and exercising at home. He feels well overall and does not want to make any changes.  We were unable to weigh the patient today for this TeleHealth visit. He feels as if he has gained weight since his last visit. He has lost 22 lbs since starting treatment with [place].  Insomnia Philip [name]'s sleep has improved in the last month. He has began exercising daily and no longer takes his melatonin to help sleep.  ASSESSMENT AND PLAN:  Other insomnia  Class 1 obesity with serious comorbidity and body mass index (BMI) of 30.0 to 30.9 in adult, unspecified obesity type  PLAN:  Insomnia Philip [name] is okay to discontinue OTC melatonin. He agrees to continue with his diet and exercise and will follow up in 4 weeks at the agreed upon time.  I spent > than 50% of the 15 minute visit on counseling as documented in the note.  Obesity Philip [name] is currently in the action stage of change. As such, his goal is to continue with weight loss efforts. He has agreed to keep a food journal with 1700 to 1800 calories and 90+ grams of protein daily. Philip [name] has been instructed to work up to a goal of 150 minutes of  combined cardio and strengthening exercise per week for weight loss and overall health benefits. We discussed the following Behavioral Modification Strategies today: increasing vegetables, increase H2O intake, and keeping healthy foods in the home.  Philip [name] has agreed to follow up with our clinic in 4 weeks. He was informed of the importance of frequent follow up visits to maximize his success with intensive lifestyle modifications for his multiple health conditions.  ALLERGIES: No Known Allergies  MEDICATIONS: Current Outpatient Medications on File Prior to Visit  Medication Sig Dispense Refill  . amLODipine (NORVASC) 5 MG tablet TAKE 1 TABLET (5 MG TOTAL) BY MOUTH DAILY. 90 tablet 3  . atorvastatin (LIPITOR) 40 MG tablet Take 1 tablet (40 mg total) by mouth at bedtime. 90 tablet 1  . B Complex-Biotin-FA (B-COMPLEX PO) Take 1 tablet by mouth daily.    Marland Kitchen buPROPion (WELLBUTRIN SR) 150 MG 12 hr tablet Take 1 tablet (150 mg total) by mouth daily. 30 tablet 0  . Cholecalciferol (VITAMIN D3) 5000 UNITS CAPS Take 5,000 Units by mouth daily.    Marland Kitchen co-enzyme Q-10 30 MG capsule Take 30 mg by mouth daily.    Marland Kitchen ELIQUIS 5 MG TABS tablet TAKE 1 TABLET TWICE DAILY 180 tablet 1  . hydrocortisone 2.5 % cream Apply 1 application topically as needed.    Marland Kitchen levothyroxine (SYNTHROID) 25 MCG tablet Take 1 tablet (25 mcg total) by mouth daily  before breakfast. 30 tablet 0  . lisinopril (PRINIVIL,ZESTRIL) 10 MG tablet TAKE 1 TABLET EVERY DAY 90 tablet 3  . magnesium oxide (MAG-OX) 400 MG tablet Take 800 mg by mouth daily.    . Melatonin 10 MG CAPS Take 10 mg at bedtime by mouth.    . Multiple Vitamins-Minerals (MULTIVITAMIN WITH MINERALS) tablet Take 1 tablet by mouth daily.    . Omega-3 Fatty Acids (OMEGA-3 FISH OIL) 1200 MG CAPS Take 2 1,200 mg capsules daily 90 capsule 3  . polyethylene glycol (MIRALAX / GLYCOLAX) packet Take 17 g by mouth daily.     No current facility-administered medications on file prior to  visit.     PAST MEDICAL HISTORY: Past Medical History:  Diagnosis Date  . AAA (abdominal aortic aneurysm) ([place])   . Atrial fibrillation ([place])   . CAD (coronary artery disease)    had a MI , s/p CABG  . Cataracts, bilateral    immature  . CHF (congestive heart failure) ([place])   . Dysrhythmia    paroximal a fib  . GERD (gastroesophageal reflux disease)    takes Omeprazole daily  . History of colon polyps   . History of kidney stones   . History of shingles   . Hyperlipidemia   . Hypertension    takes Amlodipine,Metoprolol,and Lisinopril daily  . Joint pain   . Joint swelling   . Myocardial infarction [facility])    several with last one being 2001(but never knew about any except 2001)  . OSA (obstructive sleep apnea)    started CPAP 12/09  . Peripheral vascular disease ([place])   . Pneumonia    as a child  . Skin cancer    several , one of them was melanoma (sees derm routinely)  . Tingling    feet occasionally  . Tinnitus     PAST SURGICAL HISTORY: Past Surgical History:  Procedure Laterality Date  . ABDOMINAL AORTAGRAM N/A 06/16/2014   Procedure: ABDOMINAL Maxcine Ham;  Surgeon: Angelia Mould, MD;  Location: [facility] CATH LAB;  Service: Cardiovascular;  Laterality: N/A;  . ABDOMINAL AORTIC ENDOVASCULAR STENT GRAFT N/A 07/08/2014   Procedure: ABDOMINAL AORTIC ENDOVASCULAR STENT GRAFT/ GORE;  Surgeon: Angelia Mould, MD;  Location: [facility];  Service: Vascular;  Laterality: N/A;  . CARDIAC CATHETERIZATION  2015  . COLONOSCOPY    . CORONARY ARTERY BYPASS GRAFT  1999   x 3 vessels  . LEFT HEART CATHETERIZATION WITH CORONARY/GRAFT ANGIOGRAM N/A 05/29/2014   Procedure: LEFT HEART CATHETERIZATION WITH [name];  Surgeon: [name], MD;  Location: [facility] CATH LAB;  Service: Cardiovascular;  Laterality: N/A;  . PENILE PROSTHESIS IMPLANT  08/2005  . RHINOPLASTY  1975    SOCIAL HISTORY: Social History   Tobacco Use  . Smoking status: Former Smoker    Types:  Cigarettes    Last attempt to quit: 1979    Years since quitting: 41.3  . Smokeless tobacco: Never Used  . Tobacco comment: quit smoking in 1979  Substance Use Topics  . Alcohol use: Yes    Comment: occasionally - once per week  . Drug use: No    FAMILY HISTORY: Family History  Problem Relation Age of Onset  . Prostate cancer Father 23  . Heart disease Father   . Skin cancer Father   . Heart attack Father   . Cancer Father   . Deep vein thrombosis Father   . Hyperlipidemia Father   . Hypertension Father   . Heart disease  Mother   . Skin cancer Mother   . Heart attack Mother   . Cancer Mother   . Hyperlipidemia Mother   . Hypertension Mother   . Varicose Veins Mother   . Peripheral vascular disease Mother        amputation  . Sleep apnea Brother   . Hyperlipidemia Brother   . Hypertension Brother   . Prostate cancer Brother 57  . Colon cancer Other        aunt  . Skin cancer Brother   . Skin cancer Sister   . Cancer Sister   . Heart disease Sister   . Diabetes Neg Hx   . Stroke Neg Hx     ROS: Review of Systems  Psychiatric/Behavioral: The patient has insomnia.     PHYSICAL EXAM: Pt in no acute distress  RECENT LABS AND TESTS: BMET    Component Value Date/Time   NA 142 12/10/2018 1353   K 5.2 12/10/2018 1353   CL 101 12/10/2018 1353   CO2 26 12/10/2018 1353   GLUCOSE 97 12/10/2018 1353   GLUCOSE 99 01/01/2018 1017   BUN 18 12/10/2018 1353   CREATININE 1.16 12/10/2018 1353   CREATININE 1.08 12/14/2015 1122   CALCIUM 9.9 12/10/2018 1353   GFRNONAA 60 12/10/2018 1353   GFRAA 69 12/10/2018 1353   Lab Results  Component Value Date   HGBA1C 5.8 (H) 12/10/2018   HGBA1C 5.8 (H) 06/06/2018   HGBA1C 5.6 08/14/2017   HGBA1C 5.7 (H) 04/19/2017   HGBA1C 5.9 (H) 01/18/2017   Lab Results  Component Value Date   INSULIN 15.4 12/10/2018   INSULIN 9.6 06/06/2018   INSULIN 10.8 08/14/2017   INSULIN 17.3 04/19/2017   INSULIN 16.1 01/18/2017   CBC     Component Value Date/Time   WBC 4.4 06/06/2018 0000   WBC 4.6 01/01/2018 1017   RBC 4.55 06/06/2018 0000   RBC 4.77 01/01/2018 1017   HGB 14.6 06/06/2018 0000   HCT 43.0 06/06/2018 0000   PLT 147 (L) 06/06/2018 0000   MCV 95 06/06/2018 0000   MCH 32.1 06/06/2018 0000   MCH 31.1 12/14/2015 1122   MCHC 34.0 06/06/2018 0000   MCHC 34.1 01/01/2018 1017   RDW 14.2 06/06/2018 0000   LYMPHSABS 1.4 06/06/2018 0000   MONOABS 0.4 01/01/2018 1017   EOSABS 0.1 06/06/2018 0000   BASOSABS 0.0 06/06/2018 0000   Iron/TIBC/Ferritin/ %Sat No results found for: IRON, TIBC, FERRITIN, IRONPCTSAT Lipid Panel     Component Value Date/Time   CHOL 124 09/12/2018 0837   TRIG 137 09/12/2018 0837   TRIG 256 09/14/2009 0000   HDL 43 09/12/2018 0837   CHOLHDL 2.9 09/12/2018 0837   CHOLHDL 3 12/07/2016 0852   VLDL 30.8 12/07/2016 0852   LDLCALC 54 09/12/2018 0837   LDLDIRECT 68.0 04/13/2015 0823   Hepatic Function Panel     Component Value Date/Time   PROT 6.9 12/10/2018 1353   ALBUMIN 4.9 (H) 12/10/2018 1353   AST 31 12/10/2018 1353   ALT 34 12/10/2018 1353   ALKPHOS 73 12/10/2018 1353   BILITOT 0.6 12/10/2018 1353      Component Value Date/Time   TSH 3.250 12/10/2018 1353   TSH 4.140 08/06/2018 1148   TSH 4.010 01/31/2018 1158   Results for Whitefield, BOBY EYER (MRN [id number]) as of 03/12/2019 06:29  Ref. Range 12/10/2018 13:53  Vitamin D, 25-Hydroxy Latest Ref Range: 30.0 - 100.0 ng/mL 78.4     I, Marcille Blanco, CMA, am acting  as transcriptionist for Starlyn Skeans, MD I have reviewed the above documentation for accuracy and completeness, and I agree with the above. -[name], MD

## 2019-04-01 ENCOUNTER — Other Ambulatory Visit: Payer: Self-pay

## 2019-04-01 ENCOUNTER — Encounter (INDEPENDENT_AMBULATORY_CARE_PROVIDER_SITE_OTHER): Payer: Self-pay | Admitting: Family Medicine

## 2019-04-01 ENCOUNTER — Ambulatory Visit (INDEPENDENT_AMBULATORY_CARE_PROVIDER_SITE_OTHER): Payer: Medicare HMO | Admitting: Family Medicine

## 2019-04-01 ENCOUNTER — Other Ambulatory Visit: Payer: Self-pay | Admitting: Family

## 2019-04-01 DIAGNOSIS — I714 Abdominal aortic aneurysm, without rupture, unspecified: Secondary | ICD-10-CM

## 2019-04-01 DIAGNOSIS — Z6830 Body mass index (BMI) 30.0-30.9, adult: Secondary | ICD-10-CM

## 2019-04-01 DIAGNOSIS — E669 Obesity, unspecified: Secondary | ICD-10-CM | POA: Diagnosis not present

## 2019-04-01 DIAGNOSIS — E038 Other specified hypothyroidism: Secondary | ICD-10-CM | POA: Diagnosis not present

## 2019-04-01 DIAGNOSIS — F3289 Other specified depressive episodes: Secondary | ICD-10-CM

## 2019-04-01 MED ORDER — BUPROPION HCL ER (SR) 150 MG PO TB12: 150.0000 mg | ORAL_TABLET | Freq: Every day | ORAL | 0 refills | Status: DC

## 2019-04-01 MED ORDER — LEVOTHYROXINE SODIUM 25 MCG PO TABS: 25.0000 ug | ORAL_TABLET | Freq: Every day | ORAL | 0 refills | Status: DC

## 2019-04-01 NOTE — Progress Notes (Signed)
Office: [phone]  /  Fax: [phone] TeleHealth Visit:  Philip [name] has verbally consented to this TeleHealth visit today. The patient is located at home, the provider is located at the [employer] [facility] office. The participants in this visit include the listed provider and patient. The visit was conducted today via Face Time.  HPI:   Chief Complaint: OBESITY Philip [name] is here to discuss his progress with his obesity treatment plan. He is on the  keep a food journal with 1700 to 1800 calories and 90+ grams of protein and is following his eating plan approximately 75 % of the time. He states he is walking, golfing, and doing pushups 60 minutes 5 to 6 times per week. Philip [name] continues to do well with journaling.He is exercising most days of the week and is trying to play golf 1 to 2 times per week. He is walking at least 6 miles on those days. His hunger is controlled, but he is still struggling with afternoon cravings.  We were unable to weigh the patient today for this TeleHealth visit. He feels as if he has lost weight since his last visit. He has lost 22 lbs since starting treatment with [place].  Hypothyroid Philip [name] has a diagnosis of hypothyroidism. He is stable on Synthroid. He denies hot or cold intolerance or palpitations.  Depression with emotional eating behaviors Philip [name] is stable on Wellbutrin. He is still struggling with afternoon cravings, but is doing better overall. He is struggling with emotional eating and using food for comfort to the extent that it is negatively impacting his health. He often snacks when he is not hungry. Philip [name] sometimes feels he is out of control and then feels guilty that he made poor food choices. He has been working on behavior modification techniques to help reduce his emotional eating and has been somewhat successful.   ASSESSMENT AND PLAN:  Other specified hypothyroidism - Plan: levothyroxine (SYNTHROID) 25 MCG tablet  Other depression -  with emotional eating - Plan: buPROPion (WELLBUTRIN SR) 150 MG 12 hr tablet  Class 1 obesity with serious comorbidity and body mass index (BMI) of 30.0 to 30.9 in adult, unspecified obesity type  PLAN:  Hypothyroid Philip [name] was informed of the importance of good thyroid control to help with weight loss efforts. He was also informed that supertherapeutic thyroid levels are dangerous and will not improve weight loss results. Philip [name] agrees to continue Synthroid 25 mcg before breakfast #30 with no refills. Philip [name] agrees to follow up in 4 weeks.  Depression with Emotional Eating Behaviors We discussed behavior modification techniques today to help Philip [name] deal with his emotional eating and depression. He has agreed to take Wellbutrin SR 150 mg qd #30 with no refills and agreed to follow up as directed.  Obesity Philip [name] is currently in the action stage of change. As such, his goal is to continue with weight loss efforts. He has agreed to keep a food journal with 1700 to 1800 calories and 90 grams of protein.  Philip [name] has been instructed to work up to a goal of 150 minutes of combined cardio and strengthening exercise per week for weight loss and overall health benefits. We discussed the following Behavioral Modification Strategies today: work on meal planning and easy cooking plans and ways to avoid night time snacking.  Philip [name] has agreed to follow up with our clinic in 4 weeks. He was informed of the importance of frequent follow up visits to maximize his success with intensive lifestyle modifications for his  multiple health conditions.  ALLERGIES: No Known Allergies  MEDICATIONS: Current Outpatient Medications on File Prior to Visit  Medication Sig Dispense Refill  . amLODipine (NORVASC) 5 MG tablet TAKE 1 TABLET (5 MG TOTAL) BY MOUTH DAILY. 90 tablet 3  . atorvastatin (LIPITOR) 40 MG tablet Take 1 tablet (40 mg total) by mouth at bedtime. 90 tablet 1  . B Complex-Biotin-FA (B-COMPLEX PO) Take 1  tablet by mouth daily.    . Cholecalciferol (VITAMIN D3) 5000 UNITS CAPS Take 5,000 Units by mouth daily.    Marland Kitchen co-enzyme Q-10 30 MG capsule Take 30 mg by mouth daily.    Marland Kitchen ELIQUIS 5 MG TABS tablet TAKE 1 TABLET TWICE DAILY 180 tablet 1  . hydrocortisone 2.5 % cream Apply 1 application topically as needed.    Marland Kitchen lisinopril (PRINIVIL,ZESTRIL) 10 MG tablet TAKE 1 TABLET EVERY DAY 90 tablet 3  . magnesium oxide (MAG-OX) 400 MG tablet Take 800 mg by mouth daily.    . Melatonin 10 MG CAPS Take 10 mg at bedtime by mouth.    . Multiple Vitamins-Minerals (MULTIVITAMIN WITH MINERALS) tablet Take 1 tablet by mouth daily.    . Omega-3 Fatty Acids (OMEGA-3 FISH OIL) 1200 MG CAPS Take 2 1,200 mg capsules daily 90 capsule 3  . polyethylene glycol (MIRALAX / GLYCOLAX) packet Take 17 g by mouth daily.     No current facility-administered medications on file prior to visit.     PAST MEDICAL HISTORY: Past Medical History:  Diagnosis Date  . AAA (abdominal aortic aneurysm) ([place])   . Atrial fibrillation ([place])   . CAD (coronary artery disease)    had a MI , s/p CABG  . Cataracts, bilateral    immature  . CHF (congestive heart failure) ([place])   . Dysrhythmia    paroximal a fib  . GERD (gastroesophageal reflux disease)    takes Omeprazole daily  . History of colon polyps   . History of kidney stones   . History of shingles   . Hyperlipidemia   . Hypertension    takes Amlodipine,Metoprolol,and Lisinopril daily  . Joint pain   . Joint swelling   . Myocardial infarction [facility])    several with last one being 2001(but never knew about any except 2001)  . OSA (obstructive sleep apnea)    started CPAP 12/09  . Peripheral vascular disease ([place])   . Pneumonia    as a child  . Skin cancer    several , one of them was melanoma (sees derm routinely)  . Tingling    feet occasionally  . Tinnitus     PAST SURGICAL HISTORY: Past Surgical History:  Procedure Laterality Date  . ABDOMINAL AORTAGRAM N/A  06/16/2014   Procedure: ABDOMINAL Maxcine Ham;  Surgeon: Angelia Mould, MD;  Location: [facility] CATH LAB;  Service: Cardiovascular;  Laterality: N/A;  . ABDOMINAL AORTIC ENDOVASCULAR STENT GRAFT N/A 07/08/2014   Procedure: ABDOMINAL AORTIC ENDOVASCULAR STENT GRAFT/ GORE;  Surgeon: Angelia Mould, MD;  Location: [place];  Service: Vascular;  Laterality: N/A;  . CARDIAC CATHETERIZATION  2015  . COLONOSCOPY    . CORONARY ARTERY BYPASS GRAFT  1999   x 3 vessels  . LEFT HEART CATHETERIZATION WITH CORONARY/GRAFT ANGIOGRAM N/A 05/29/2014   Procedure: LEFT HEART CATHETERIZATION WITH [name];  Surgeon: [name], MD;  Location: [facility] CATH LAB;  Service: Cardiovascular;  Laterality: N/A;  . PENILE PROSTHESIS IMPLANT  08/2005  . RHINOPLASTY  1975    SOCIAL HISTORY: Social  History   Tobacco Use  . Smoking status: Former Smoker    Types: Cigarettes    Last attempt to quit: 1979    Years since quitting: 41.4  . Smokeless tobacco: Never Used  . Tobacco comment: quit smoking in 1979  Substance Use Topics  . Alcohol use: Yes    Comment: occasionally - once per week  . Drug use: No    FAMILY HISTORY: Family History  Problem Relation Age of Onset  . Prostate cancer Father 65  . Heart disease Father   . Skin cancer Father   . Heart attack Father   . Cancer Father   . Deep vein thrombosis Father   . Hyperlipidemia Father   . Hypertension Father   . Heart disease Mother   . Skin cancer Mother   . Heart attack Mother   . Cancer Mother   . Hyperlipidemia Mother   . Hypertension Mother   . Varicose Veins Mother   . Peripheral vascular disease Mother        amputation  . Sleep apnea Brother   . Hyperlipidemia Brother   . Hypertension Brother   . Prostate cancer Brother 3  . Colon cancer Other        aunt  . Skin cancer Brother   . Skin cancer Sister   . Cancer Sister   . Heart disease Sister   . Diabetes Neg Hx   . Stroke Neg Hx     ROS: Review of Systems   Cardiovascular: Negative for palpitations.  Endo/Heme/Allergies:       Negative for heat intolerance. Negative for cold intolerance.  Psychiatric/Behavioral: Positive for depression.    PHYSICAL EXAM: Pt in no acute distress  RECENT LABS AND TESTS: BMET    Component Value Date/Time   NA 142 12/10/2018 1353   K 5.2 12/10/2018 1353   CL 101 12/10/2018 1353   CO2 26 12/10/2018 1353   GLUCOSE 97 12/10/2018 1353   GLUCOSE 99 01/01/2018 1017   BUN 18 12/10/2018 1353   CREATININE 1.16 12/10/2018 1353   CREATININE 1.08 12/14/2015 1122   CALCIUM 9.9 12/10/2018 1353   GFRNONAA 60 12/10/2018 1353   GFRAA 69 12/10/2018 1353   Lab Results  Component Value Date   HGBA1C 5.8 (H) 12/10/2018   HGBA1C 5.8 (H) 06/06/2018   HGBA1C 5.6 08/14/2017   HGBA1C 5.7 (H) 04/19/2017   HGBA1C 5.9 (H) 01/18/2017   Lab Results  Component Value Date   INSULIN 15.4 12/10/2018   INSULIN 9.6 06/06/2018   INSULIN 10.8 08/14/2017   INSULIN 17.3 04/19/2017   INSULIN 16.1 01/18/2017   CBC    Component Value Date/Time   WBC 4.4 06/06/2018 0000   WBC 4.6 01/01/2018 1017   RBC 4.55 06/06/2018 0000   RBC 4.77 01/01/2018 1017   HGB 14.6 06/06/2018 0000   HCT 43.0 06/06/2018 0000   PLT 147 (L) 06/06/2018 0000   MCV 95 06/06/2018 0000   MCH 32.1 06/06/2018 0000   MCH 31.1 12/14/2015 1122   MCHC 34.0 06/06/2018 0000   MCHC 34.1 01/01/2018 1017   RDW 14.2 06/06/2018 0000   LYMPHSABS 1.4 06/06/2018 0000   MONOABS 0.4 01/01/2018 1017   EOSABS 0.1 06/06/2018 0000   BASOSABS 0.0 06/06/2018 0000   Iron/TIBC/Ferritin/ %Sat No results found for: IRON, TIBC, FERRITIN, IRONPCTSAT Lipid Panel     Component Value Date/Time   CHOL 124 09/12/2018 0837   TRIG 137 09/12/2018 0837   TRIG 256 09/14/2009 0000  HDL 43 09/12/2018 0837   CHOLHDL 2.9 09/12/2018 0837   CHOLHDL 3 12/07/2016 0852   VLDL 30.8 12/07/2016 0852   LDLCALC 54 09/12/2018 0837   LDLDIRECT 68.0 04/13/2015 0823   Hepatic Function Panel      Component Value Date/Time   PROT 6.9 12/10/2018 1353   ALBUMIN 4.9 (H) 12/10/2018 1353   AST 31 12/10/2018 1353   ALT 34 12/10/2018 1353   ALKPHOS 73 12/10/2018 1353   BILITOT 0.6 12/10/2018 1353      Component Value Date/Time   TSH 3.250 12/10/2018 1353   TSH 4.140 08/06/2018 1148   TSH 4.010 01/31/2018 1158    Results for Hornstein, WORTHINGTON CRUZAN (MRN [id number]) as of 04/01/2019 13:25  Ref. Range 12/10/2018 13:53  Hemoglobin A1C Latest Ref Range: 4.8 - 5.6 % 5.8 (H)    I, Marcille Blanco, CMA, am acting as transcriptionist for Starlyn Skeans, MD I have reviewed the above documentation for accuracy and completeness, and I agree with the above. -[name], MD

## 2019-04-02 DIAGNOSIS — F329 Major depressive disorder, single episode, unspecified: Secondary | ICD-10-CM | POA: Insufficient documentation

## 2019-04-02 DIAGNOSIS — F32A Depression, unspecified: Secondary | ICD-10-CM | POA: Insufficient documentation

## 2019-04-05 ENCOUNTER — Other Ambulatory Visit (INDEPENDENT_AMBULATORY_CARE_PROVIDER_SITE_OTHER): Payer: Self-pay | Admitting: Family Medicine

## 2019-04-05 DIAGNOSIS — E038 Other specified hypothyroidism: Secondary | ICD-10-CM

## 2019-04-05 DIAGNOSIS — F3289 Other specified depressive episodes: Secondary | ICD-10-CM

## 2019-04-08 MED ORDER — BUPROPION HCL ER (SR) 150 MG PO TB12: 150.0000 mg | ORAL_TABLET | Freq: Every day | ORAL | 0 refills | Status: DC

## 2019-04-08 MED ORDER — LEVOTHYROXINE SODIUM 25 MCG PO TABS: 25.0000 ug | ORAL_TABLET | Freq: Every day | ORAL | 0 refills | Status: DC

## 2019-04-10 ENCOUNTER — Encounter: Payer: Self-pay | Admitting: Family

## 2019-04-19 DIAGNOSIS — G4733 Obstructive sleep apnea (adult) (pediatric): Secondary | ICD-10-CM | POA: Diagnosis not present

## 2019-04-24 DIAGNOSIS — L570 Actinic keratosis: Secondary | ICD-10-CM | POA: Diagnosis not present

## 2019-04-24 DIAGNOSIS — Z85828 Personal history of other malignant neoplasm of skin: Secondary | ICD-10-CM | POA: Diagnosis not present

## 2019-04-24 DIAGNOSIS — L819 Disorder of pigmentation, unspecified: Secondary | ICD-10-CM | POA: Diagnosis not present

## 2019-04-26 ENCOUNTER — Other Ambulatory Visit: Payer: Self-pay

## 2019-04-26 ENCOUNTER — Ambulatory Visit (INDEPENDENT_AMBULATORY_CARE_PROVIDER_SITE_OTHER): Payer: Medicare HMO | Admitting: Internal Medicine

## 2019-04-26 ENCOUNTER — Encounter: Payer: Self-pay | Admitting: Internal Medicine

## 2019-04-26 VITALS — BP 156/76 | HR 75 | Temp 97.8°F | Resp 16 | Ht 69.0 in | Wt 208.5 lb

## 2019-04-26 DIAGNOSIS — G4733 Obstructive sleep apnea (adult) (pediatric): Secondary | ICD-10-CM

## 2019-04-26 DIAGNOSIS — M79601 Pain in right arm: Secondary | ICD-10-CM | POA: Diagnosis not present

## 2019-04-26 DIAGNOSIS — Z1211 Encounter for screening for malignant neoplasm of colon: Secondary | ICD-10-CM

## 2019-04-26 DIAGNOSIS — R399 Unspecified symptoms and signs involving the genitourinary system: Secondary | ICD-10-CM

## 2019-04-26 DIAGNOSIS — Z0000 Encounter for general adult medical examination without abnormal findings: Secondary | ICD-10-CM

## 2019-04-26 DIAGNOSIS — E782 Mixed hyperlipidemia: Secondary | ICD-10-CM | POA: Diagnosis not present

## 2019-04-26 DIAGNOSIS — I10 Essential (primary) hypertension: Secondary | ICD-10-CM | POA: Diagnosis not present

## 2019-04-26 DIAGNOSIS — I110 Hypertensive heart disease with heart failure: Secondary | ICD-10-CM | POA: Diagnosis not present

## 2019-04-26 DIAGNOSIS — I2510 Atherosclerotic heart disease of native coronary artery without angina pectoris: Secondary | ICD-10-CM

## 2019-04-26 DIAGNOSIS — I509 Heart failure, unspecified: Secondary | ICD-10-CM | POA: Diagnosis not present

## 2019-04-26 LAB — CBC WITH DIFFERENTIAL/PLATELET
Basophils Absolute: 0 10*3/uL (ref 0.0–0.1)
Basophils Relative: 0.8 % (ref 0.0–3.0)
Eosinophils Absolute: 0.1 10*3/uL (ref 0.0–0.7)
Eosinophils Relative: 1.9 % (ref 0.0–5.0)
HCT: 43.3 % (ref 39.0–52.0)
Hemoglobin: 14.8 g/dL (ref 13.0–17.0)
Lymphocytes Relative: 38.4 % (ref 12.0–46.0)
Lymphs Abs: 2 10*3/uL (ref 0.7–4.0)
MCHC: 34.2 g/dL (ref 30.0–36.0)
MCV: 94.8 fl (ref 78.0–100.0)
Monocytes Absolute: 0.6 10*3/uL (ref 0.1–1.0)
Monocytes Relative: 10.9 % (ref 3.0–12.0)
Neutro Abs: 2.5 10*3/uL (ref 1.4–7.7)
Neutrophils Relative %: 48 % (ref 43.0–77.0)
Platelets: 156 10*3/uL (ref 150.0–400.0)
RBC: 4.57 Mil/uL (ref 4.22–5.81)
RDW: 13 % (ref 11.5–15.5)
WBC: 5.2 10*3/uL (ref 4.0–10.5)

## 2019-04-26 LAB — BASIC METABOLIC PANEL
BUN: 21 mg/dL (ref 6–23)
CO2: 30 mEq/L (ref 19–32)
Calcium: 9.3 mg/dL (ref 8.4–10.5)
Chloride: 104 mEq/L (ref 96–112)
Creatinine, Ser: 0.95 mg/dL (ref 0.40–1.50)
GFR: 76.49 mL/min (ref >60.00)
Glucose, Bld: 81 mg/dL (ref 70–99)
Potassium: 4.4 mEq/L (ref 3.5–5.1)
Sodium: 141 mEq/L (ref 135–145)

## 2019-04-26 MED ORDER — TAMSULOSIN HCL 0.4 MG PO CAPS: 0.4000 mg | ORAL_CAPSULE | Freq: Every day | ORAL | 6 refills | Status: DC

## 2019-04-26 MED ORDER — DICLOFENAC SODIUM 1 % TD GEL: 4.0000 g | Freq: Four times a day (QID) | TRANSDERMAL | 3 refills | Status: DC | PRN

## 2019-04-26 NOTE — Patient Instructions (Addendum)
Get the blood work     [place] Schedule your next appointment for checkup in 6 months Start tamsulosin to help with urinary flow  Check the  blood pressure 2 or 3 times a week Be sure your blood pressure is between 110/65 and  135/85. If it is consistently higher or lower, let me know  Get your second Shingrix when you can  Voltaren gel: Put on your right arm 3 or 4 times a day, if you are no better call for a referral

## 2019-04-26 NOTE — Progress Notes (Signed)
Pre visit review using our clinic review tool, if applicable. No additional management support is needed unless otherwise documented below in the visit note. 

## 2019-04-26 NOTE — Progress Notes (Signed)
Subjective:    Patient ID: Philip [name], male    DOB: 08-Feb-1941, 78 y.o.   MRN: [id number]  DOS:  04/26/2019 Type of visit - description: cpx In general feeling well. Continue with tremor, right thumb Insomnia: Stop melatonin, not an issue at this point, sleeping well. Ambulatory BPs are normal    BP Readings from Last 3 Encounters:  04/26/19 (!) 156/76  02/04/19 121/75  01/07/19 133/70      Review of Systems Reports no dysuria, gross hematuria or urinary frequency, does have slow flow. Reports pain at the right arm with certain activities such as lifting a cup of coffee.  Other than above, a 14 point review of systems is negative      Past Medical History:  Diagnosis Date  . AAA (abdominal aortic aneurysm) ([place])   . Atrial fibrillation ([place])   . CAD (coronary artery disease)    had a MI , s/p CABG  . Cataracts, bilateral    immature  . CHF (congestive heart failure) ([place])   . Dysrhythmia    paroximal a fib  . GERD (gastroesophageal reflux disease)    takes Omeprazole daily  . History of colon polyps   . History of kidney stones   . History of shingles   . Hyperlipidemia   . Hypertension    takes Amlodipine,Metoprolol,and Lisinopril daily  . Joint pain   . Joint swelling   . Myocardial infarction [facility])    several with last one being 2001(but never knew about any except 2001)  . OSA (obstructive sleep apnea)    started CPAP 12/09  . Peripheral vascular disease ([place])   . Pneumonia    as a child  . Skin cancer    several , one of them was melanoma (sees derm routinely)  . Tingling    feet occasionally  . Tinnitus     Past Surgical History:  Procedure Laterality Date  . ABDOMINAL AORTAGRAM N/A 06/16/2014   Procedure: ABDOMINAL Maxcine Ham;  Surgeon: Angelia Mould, MD;  Location: [facility] CATH LAB;  Service: Cardiovascular;  Laterality: N/A;  . ABDOMINAL AORTIC ENDOVASCULAR STENT GRAFT N/A 07/08/2014   Procedure: ABDOMINAL AORTIC ENDOVASCULAR STENT  GRAFT/ GORE;  Surgeon: Angelia Mould, MD;  Location: [place];  Service: Vascular;  Laterality: N/A;  . CARDIAC CATHETERIZATION  2015  . COLONOSCOPY    . CORONARY ARTERY BYPASS GRAFT  1999   x 3 vessels  . LEFT HEART CATHETERIZATION WITH CORONARY/GRAFT ANGIOGRAM N/A 05/29/2014   Procedure: LEFT HEART CATHETERIZATION WITH [name];  Surgeon: [name], MD;  Location: [facility] CATH LAB;  Service: Cardiovascular;  Laterality: N/A;  . PENILE PROSTHESIS IMPLANT  08/2005  . RHINOPLASTY  1975    Social History   Socioeconomic History  . Marital status: Married    Spouse name: Not on file  . Number of children: 3  . Years of education: 89  . Highest education level: Not on file  Occupational History  . Occupation: retired, Actor  Social Needs  . Financial resource strain: Not on file  . Food insecurity    Worry: Not on file    Inability: Not on file  . Transportation needs    Medical: Not on file    Non-medical: Not on file  Tobacco Use  . Smoking status: Former Smoker    Types: Cigarettes    Quit date: 1979    Years since quitting: 41.5  . Smokeless tobacco: Never Used  . Tobacco  comment: quit smoking in 1979  Substance and Sexual Activity  . Alcohol use: Yes    Comment: occasionally - once per week  . Drug use: No  . Sexual activity: Not Currently  Lifestyle  . Physical activity    Days per week: Not on file    Minutes per session: Not on file  . Stress: Not on file  Relationships  . Social Herbalist on phone: Not on file    Gets together: Not on file    Attends religious service: Not on file    Active member of club or organization: Not on file    Attends meetings of clubs or organizations: Not on file    Relationship status: Not on file  . Intimate partner violence    Fear of current or ex partner: Not on file    Emotionally abused: Not on file    Physically abused: Not on file    Forced sexual activity: Not on file  Other  Topics Concern  . Not on file  Social History Narrative   Lives w/ wife in a 2 story home.  Has one son and 2 stepchildren.  Retired for [employer].     Education: high school.     Family History  Problem Relation Age of Onset  . Prostate cancer Father 52  . Heart disease Father   . Skin cancer Father   . Heart attack Father   . Cancer Father   . Deep vein thrombosis Father   . Hyperlipidemia Father   . Hypertension Father   . Heart disease Mother   . Skin cancer Mother   . Heart attack Mother   . Cancer Mother   . Hyperlipidemia Mother   . Hypertension Mother   . Varicose Veins Mother   . Peripheral vascular disease Mother        amputation  . Sleep apnea Brother   . Hyperlipidemia Brother   . Hypertension Brother   . Prostate cancer Brother 39  . Colon cancer Other        aunt  . Skin cancer Brother   . Skin cancer Sister   . Cancer Sister   . Heart disease Sister   . Diabetes Neg Hx   . Stroke Neg Hx      Allergies as of 04/26/2019   No Known Allergies     Medication List       Accurate as of April 26, 2019 11:59 PM. If you have any questions, ask your nurse or doctor.        STOP taking these medications   Melatonin 10 MG Caps Stopped by: Kathlene November, MD     TAKE these medications   amLODipine 5 MG tablet Commonly known as: NORVASC TAKE 1 TABLET (5 MG TOTAL) BY MOUTH DAILY.   atorvastatin 40 MG tablet Commonly known as: LIPITOR Take 1 tablet (40 mg total) by mouth at bedtime.   B-COMPLEX PO Take 1 tablet by mouth daily.   buPROPion 150 MG 12 hr tablet Commonly known as: WELLBUTRIN SR Take 1 tablet (150 mg total) by mouth daily.   co-enzyme Q-10 30 MG capsule Take 30 mg by mouth daily.   diclofenac sodium 1 % Gel Commonly known as: VOLTAREN Apply 4 g topically 4 (four) times daily as needed. Started by: Kathlene November, MD   Eliquis 5 MG Tabs tablet Generic drug: apixaban TAKE 1 TABLET TWICE DAILY   hydrocortisone 2.5 % cream Apply 1 application  topically as needed.   levothyroxine 25 MCG tablet Commonly known as: SYNTHROID Take 1 tablet (25 mcg total) by mouth daily before breakfast.   lisinopril 10 MG tablet Commonly known as: ZESTRIL TAKE 1 TABLET EVERY DAY   magnesium oxide 400 MG tablet Commonly known as: MAG-OX Take 800 mg by mouth daily.   multivitamin with minerals tablet Take 1 tablet by mouth daily.   Omega-3 Fish Oil 1200 MG Caps Take 2 1,200 mg capsules daily   polyethylene glycol 17 g packet Commonly known as: MIRALAX / GLYCOLAX Take 17 g by mouth daily.   tamsulosin 0.4 MG Caps capsule Commonly known as: FLOMAX Take 1 capsule (0.4 mg total) by mouth daily after supper. Started by: Kathlene November, MD   Vitamin D3 125 MCG (5000 UT) Caps Take 5,000 Units by mouth daily.           Objective:   Physical Exam Musculoskeletal:       Arms:    BP (!) 156/76 (BP Location: Left Arm, Patient Position: Sitting, Cuff Size: Small)   Pulse 75   Temp 97.8 F (36.6 C) (Oral)   Resp 16   Ht 5\' 9"  (1.753 m)   Wt 208 lb 8 oz (94.6 kg)   SpO2 100%   BMI 30.79 kg/m  General: Well developed, NAD, BMI noted Neck: No  thyromegaly  HEENT:  Normocephalic . Face symmetric, atraumatic Lungs:  CTA B Normal respiratory effort, no intercostal retractions, no accessory muscle use. Heart: RRR,  no murmur.  No pretibial edema bilaterally  Abdomen:  Not distended, soft, non-tender. No rebound or rigidity.   Skin: Exposed areas without rash. Not pale. Not jaundice Neurologic:  alert & oriented X3.  Speech normal, gait appropriate for age and unassisted Strength symmetric and appropriate for age.  Psych: Cognition and judgment appear intact.  Cooperative with normal attention span and concentration.  Behavior appropriate. No anxious or depressed appearing.     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 Fib, paroxysmal  GERD  Chronic constipation OSA- Cpap (Dr [name]) ED- penile implant H/o skin cancer, Dr. [name] H/o Kidney stones 2016  PLAN:  HTN:BP today slightly elevated, normal at home, continue amlodipine, lisinopril.  Checking a BMP and CBC Hyperlipidemia: Controlled, continue Lipitor CAD: Asymptomatic, last visit with cardiology 07-2018 OSA: Good compliance with CPAP History of skin cancer: Sees dermatology regularly L UTS: Urinary flow slightly slow, treatment?  We can try Flomax.  Prescription sent. Insomnia: Currently not an issue. Right arm pain: Likely tendinitis, recommend topical NSAIDs. Tremor: R thumb- observation for now  RTC 6 months    Today, I spent more than  20  min with the patient (in addition to CPX): >50% of the time counseling regards chronic medical issues including HTN, reviewing his cholesterol results, addressing new symptoms (LUTS) and right arm pain.

## 2019-04-26 NOTE — Assessment & Plan Note (Addendum)
-  [id number]; pnm 23: 2018;  prevnar :2015; s/p shingrix x 1, rec to get the 2nd one when possible;  s/p zostavax [employer]) ; rec flu shot yearly  -CCS: Several Cscopes, had one in 2007 aprox. polyps x 6 ,  03-2009, hyperplastic polyps. 09-2014: polyps, pt told 5 years; not sure if likes to proceed, refer to GI [facility]) for further discussion He has a family history of prostate cancer: last year  DRE with a ? of noticeable lobule on the right.    PSA.stable and < 4.0 Has LUTS, start flomax -Diet and exercise: Doing well -Labs: CMP, CBC

## 2019-04-28 NOTE — Assessment & Plan Note (Signed)
HTN:BP today slightly elevated, normal at home, continue amlodipine, lisinopril.  Checking a BMP and CBC Hyperlipidemia: Controlled, continue Lipitor CAD: Asymptomatic, last visit with cardiology 07-2018 OSA: Good compliance with CPAP History of skin cancer: Sees dermatology regularly L UTS: Urinary flow slightly slow, treatment?  We can try Flomax.  Prescription sent. Insomnia: Currently not an issue. Right arm pain: Likely tendinitis, recommend topical NSAIDs. Tremor: R thumb- observation for now  RTC 6 months

## 2019-04-29 ENCOUNTER — Encounter (INDEPENDENT_AMBULATORY_CARE_PROVIDER_SITE_OTHER): Payer: Self-pay | Admitting: Family Medicine

## 2019-04-29 ENCOUNTER — Other Ambulatory Visit: Payer: Self-pay

## 2019-04-29 ENCOUNTER — Ambulatory Visit (INDEPENDENT_AMBULATORY_CARE_PROVIDER_SITE_OTHER): Payer: Medicare HMO | Admitting: Family Medicine

## 2019-04-29 DIAGNOSIS — E669 Obesity, unspecified: Secondary | ICD-10-CM | POA: Diagnosis not present

## 2019-04-29 DIAGNOSIS — F3289 Other specified depressive episodes: Secondary | ICD-10-CM | POA: Diagnosis not present

## 2019-04-29 DIAGNOSIS — E038 Other specified hypothyroidism: Secondary | ICD-10-CM

## 2019-04-29 DIAGNOSIS — Z6830 Body mass index (BMI) 30.0-30.9, adult: Secondary | ICD-10-CM | POA: Diagnosis not present

## 2019-04-29 DIAGNOSIS — I10 Essential (primary) hypertension: Secondary | ICD-10-CM | POA: Diagnosis not present

## 2019-04-29 DIAGNOSIS — E559 Vitamin D deficiency, unspecified: Secondary | ICD-10-CM

## 2019-04-30 ENCOUNTER — Other Ambulatory Visit: Payer: Medicare HMO

## 2019-04-30 MED ORDER — LEVOTHYROXINE SODIUM 25 MCG PO TABS: 25.0000 ug | ORAL_TABLET | Freq: Every day | ORAL | 0 refills | Status: DC

## 2019-04-30 MED ORDER — BUPROPION HCL ER (SR) 150 MG PO TB12: 150.0000 mg | ORAL_TABLET | Freq: Every day | ORAL | 0 refills | Status: DC

## 2019-04-30 NOTE — Progress Notes (Signed)
Office: [phone]  /  Fax: [phone] TeleHealth Visit:  Philip [name] has verbally consented to this TeleHealth visit today. The patient is located at home, the provider is located at the [facility] office. The participants in this visit include the listed provider and patient. The visit was conducted today via face time.  HPI:   Chief Complaint: OBESITY Philip [name] is here to discuss his progress with his obesity treatment plan. He is on the keep a food journal with 1700-1800 calories and 90+ grams of protein daily and is following his eating plan approximately 60 % of the time. He states he is walking for 60 minutes 7 times per week. Philip [name] thinks he has gained 1-2 lbs while on his vacation last week. He is planning on getting back on track with his eating plan now and he is still exercising regularly. He notes his sleep is good and his energy is good.  We were unable to weigh the patient today for this TeleHealth visit. He feels as if he has gained weight since his last visit. He has lost 22 lbs since starting treatment with [place].  Hypothyroidism Philip [name] has a diagnosis of hypothyroidism. He is stable on his thyroid medications. He denies hot or cold intolerance. He notes a thumb tremor recently but denies palpitations.  Vitamin D Deficiency Philip [name] has a diagnosis of vitamin D deficiency. Last Vit D level was at goal. He is due for labs. He denies nausea, vomiting or muscle weakness.  Hypertension Philip [name] is a 78 y.o. male with hypertension. Philip [name]'s blood pressure is elevated today, as well as at his last visit. His home reading today is 154/75. He denies chest pain, headaches, or dizziness. He is working on weight loss to help control his blood pressure with the goal of decreasing his risk of heart attack and stroke. Philip [name]'s blood pressure is not currently controlled.  Depression with Emotional Eating Behaviors Philip [name]'s mood is stable on Wellbutrin. He has had  some occasional increase in blood pressure recently, but this does not appear to be related to the Wellbutrin. Philip [name] struggles with emotional eating and using food for comfort to the extent that it is negatively impacting his health. He often snacks when he is not hungry. Philip [name] sometimes feels he is out of control and then feels guilty that he made poor food choices. He has been working on behavior modification techniques to help reduce his emotional eating and has been somewhat successful. He shows no sign of suicidal or homicidal ideations.  Depression screen [facility] 2/9 09/03/2018 01/18/2017 12/06/2016 12/21/2015 06/19/2015  Decreased Interest 0 3 1 0 0  Down, Depressed, Hopeless 0 1 0 0 0  PHQ - 2 Score 0 4 1 0 0  Altered sleeping - 2 - - -  Tired, decreased energy - 2 - - -  Change in appetite - 1 - - -  Feeling bad or failure about yourself  - 2 - - -  Trouble concentrating - 1 - - -  Moving slowly or fidgety/restless - 1 - - -  Suicidal thoughts - 0 - - -  PHQ-9 Score - 13 - - -    ASSESSMENT AND PLAN:  Other specified hypothyroidism - Plan: T3, T4, free, TSH, levothyroxine (SYNTHROID) 25 MCG tablet  Other depression - Plan: buPROPion (WELLBUTRIN SR) 150 MG 12 hr tablet  Essential hypertension  Vitamin D deficiency - Plan: VITAMIN D 25 Hydroxy (Vit-D Deficiency, Fractures)  Class 1 obesity with serious  comorbidity and body mass index (BMI) of 30.0 to 30.9 in adult, unspecified obesity type  Other depression - with emotional eating - Plan: buPROPion (WELLBUTRIN SR) 150 MG 12 hr tablet  PLAN:  Hypothyroidism Philip [name] was informed of the importance of good thyroid control to help with weight loss efforts. He was also informed that supertheraputic thyroid levels are dangerous and will not improve weight loss results. Philip [name] agrees to continue taking levothyroxine 25 mcg qd #30 and we will refill for 1 month. We will check labs today. Philip [name] agrees to follow up with our clinic in 4  weeks.  Vitamin D Deficiency Philip [name] was informed that low vitamin D levels contributes to fatigue and are associated with obesity, breast, and colon cancer. He will follow up for routine testing of vitamin D, at least 2-3 times per year. He was informed of the risk of over-replacement of vitamin D and agrees to not increase his dose unless he discusses this with [place] first. We will check labs today. Philip [name] agrees to follow up with our clinic in 4 weeks.  Hypertension We discussed sodium restriction, working on healthy weight loss, and a regular exercise program as the means to achieve improved blood pressure control. Philip [name] agreed with this plan and agreed to follow up as directed. We will continue to monitor his blood pressure as well as his progress with the above lifestyle modifications. Philip [name] will continue his diet and we will recheck blood pressure in 2 weeks, if no improvement we will adjust his medications at that time. He will watch for signs of hypotension as he continues his lifestyle modifications. Philip [name] agrees to follow up with our clinic in 4 weeks.  Depression with Emotional Eating Behaviors We discussed behavior modification techniques today to help Philip [name] deal with his emotional eating and depression. Philip [name] agrees to continue taking Wellbutrin SR 150 mg qd #30 and we will refill for 1 month. Philip [name] agrees to follow up with our clinic in 4 weeks.  Obesity Philip [name] is currently in the action stage of change. As such, his goal is to continue with weight loss efforts He has agreed to keep a food journal with 1700-1800 calories and 90+ grams of protein daily Philip [name] has been instructed to work up to a goal of 150 minutes of combined cardio and strengthening exercise per week for weight loss and overall health benefits. We discussed the following Behavioral Modification Strategies today: increasing lean protein intake, decreasing simple carbohydrates , decreasing sodium intake and work on  meal planning and easy cooking plans   Philip [name] has agreed to follow up with our clinic in 4 weeks. He was informed of the importance of frequent follow up visits to maximize his success with intensive lifestyle modifications for his multiple health conditions.  ALLERGIES: No Known Allergies  MEDICATIONS: Current Outpatient Medications on File Prior to Visit  Medication Sig Dispense Refill   amLODipine (NORVASC) 5 MG tablet TAKE 1 TABLET (5 MG TOTAL) BY MOUTH DAILY. 90 tablet 3   atorvastatin (LIPITOR) 40 MG tablet Take 1 tablet (40 mg total) by mouth at bedtime. 90 tablet 1   B Complex-Biotin-FA (B-COMPLEX PO) Take 1 tablet by mouth daily.     buPROPion (WELLBUTRIN SR) 150 MG 12 hr tablet Take 1 tablet (150 mg total) by mouth daily. 30 tablet 0   Cholecalciferol (VITAMIN D3) 5000 UNITS CAPS Take 5,000 Units by mouth daily.     co-enzyme Q-10 30 MG capsule Take 30 mg by mouth daily.  diclofenac sodium (VOLTAREN) 1 % GEL Apply 4 g topically 4 (four) times daily as needed. 100 g 3   ELIQUIS 5 MG TABS tablet TAKE 1 TABLET TWICE DAILY 180 tablet 1   hydrocortisone 2.5 % cream Apply 1 application topically as needed.     levothyroxine (SYNTHROID) 25 MCG tablet Take 1 tablet (25 mcg total) by mouth daily before breakfast. 30 tablet 0   lisinopril (PRINIVIL,ZESTRIL) 10 MG tablet TAKE 1 TABLET EVERY DAY 90 tablet 3   magnesium oxide (MAG-OX) 400 MG tablet Take 800 mg by mouth daily.     Multiple Vitamins-Minerals (MULTIVITAMIN WITH MINERALS) tablet Take 1 tablet by mouth daily.     Omega-3 Fatty Acids (OMEGA-3 FISH OIL) 1200 MG CAPS Take 2 1,200 mg capsules daily 90 capsule 3   polyethylene glycol (MIRALAX / GLYCOLAX) packet Take 17 g by mouth daily.     tamsulosin (FLOMAX) 0.4 MG CAPS capsule Take 1 capsule (0.4 mg total) by mouth daily after supper. 30 capsule 6   No current facility-administered medications on file prior to visit.     PAST MEDICAL HISTORY: Past Medical  History:  Diagnosis Date   AAA (abdominal aortic aneurysm) ([facility])    Atrial fibrillation ([facility])    CAD (coronary artery disease)    had a MI , s/p CABG   Cataracts, bilateral    immature   CHF (congestive heart failure) ([facility])    Dysrhythmia    paroximal a fib   GERD (gastroesophageal reflux disease)    takes Omeprazole daily   History of colon polyps    History of kidney stones    History of shingles    Hyperlipidemia    Hypertension    takes Amlodipine,Metoprolol,and Lisinopril daily   Joint pain    Joint swelling    Myocardial infarction ([place])    several with last one being 2001(but never knew about any except 2001)   OSA (obstructive sleep apnea)    started CPAP 12/09   Peripheral vascular disease ([place])    Pneumonia    as a child   Skin cancer    several , one of them was melanoma (sees derm routinely)   Tingling    feet occasionally   Tinnitus     PAST SURGICAL HISTORY: Past Surgical History:  Procedure Laterality Date   ABDOMINAL AORTAGRAM N/A 06/16/2014   Procedure: ABDOMINAL Maxcine Ham;  Surgeon: Angelia Mould, MD;  Location: [facility] CATH LAB;  Service: Cardiovascular;  Laterality: N/A;   ABDOMINAL AORTIC ENDOVASCULAR STENT GRAFT N/A 07/08/2014   Procedure: ABDOMINAL AORTIC ENDOVASCULAR STENT GRAFT/ GORE;  Surgeon: Angelia Mould, MD;  Location: [place];  Service: Vascular;  Laterality: N/A;   CARDIAC CATHETERIZATION  2015   COLONOSCOPY     CORONARY ARTERY BYPASS GRAFT  1999   x 3 vessels   LEFT HEART CATHETERIZATION WITH CORONARY/GRAFT ANGIOGRAM N/A 05/29/2014   Procedure: LEFT HEART CATHETERIZATION WITH [name];  Surgeon: [name], MD;  Location: [facility] CATH LAB;  Service: Cardiovascular;  Laterality: N/A;   PENILE PROSTHESIS IMPLANT  08/2005   RHINOPLASTY  1975    SOCIAL HISTORY: Social History   Tobacco Use   Smoking status: Former Smoker    Types: Cigarettes    Quit date: 1979    Years since  quitting: 41.5   Smokeless tobacco: Never Used   Tobacco comment: quit smoking in 1979  Substance Use Topics   Alcohol use: Yes    Comment: occasionally - once per  week   Drug use: No    FAMILY HISTORY: Family History  Problem Relation Age of Onset   Prostate cancer Father 63   Heart disease Father    Skin cancer Father    Heart attack Father    Cancer Father    Deep vein thrombosis Father    Hyperlipidemia Father    Hypertension Father    Heart disease Mother    Skin cancer Mother    Heart attack Mother    Cancer Mother    Hyperlipidemia Mother    Hypertension Mother    Varicose Veins Mother    Peripheral vascular disease Mother        amputation   Sleep apnea Brother    Hyperlipidemia Brother    Hypertension Brother    Prostate cancer Brother 33   Colon cancer Other        aunt   Skin cancer Brother    Skin cancer Sister    Cancer Sister    Heart disease Sister    Diabetes Neg Hx    Stroke Neg Hx     ROS: Review of Systems  Constitutional: Negative for weight loss.  Cardiovascular: Negative for chest pain and palpitations.  Gastrointestinal: Negative for nausea and vomiting.  Musculoskeletal:       Negative muscle weakness  Neurological: Negative for dizziness and headaches.  Endo/Heme/Allergies:       Negative hot/cold intolerance  Psychiatric/Behavioral: Positive for depression. Negative for suicidal ideas.    PHYSICAL EXAM: Pt in no acute distress  RECENT LABS AND TESTS: BMET    Component Value Date/Time   NA 141 04/26/2019 1341   NA 142 12/10/2018 1353   K 4.4 04/26/2019 1341   CL 104 04/26/2019 1341   CO2 30 04/26/2019 1341   GLUCOSE 81 04/26/2019 1341   BUN 21 04/26/2019 1341   BUN 18 12/10/2018 1353   CREATININE 0.95 04/26/2019 1341   CREATININE 1.08 12/14/2015 1122   CALCIUM 9.3 04/26/2019 1341   GFRNONAA 60 12/10/2018 1353   GFRAA 69 12/10/2018 1353   Lab Results  Component Value Date   HGBA1C 5.8  (H) 12/10/2018   HGBA1C 5.8 (H) 06/06/2018   HGBA1C 5.6 08/14/2017   HGBA1C 5.7 (H) 04/19/2017   HGBA1C 5.9 (H) 01/18/2017   Lab Results  Component Value Date   INSULIN 15.4 12/10/2018   INSULIN 9.6 06/06/2018   INSULIN 10.8 08/14/2017   INSULIN 17.3 04/19/2017   INSULIN 16.1 01/18/2017   CBC    Component Value Date/Time   WBC 5.2 04/26/2019 1341   RBC 4.57 04/26/2019 1341   HGB 14.8 04/26/2019 1341   HGB 14.6 06/06/2018 0000   HCT 43.3 04/26/2019 1341   HCT 43.0 06/06/2018 0000   PLT 156.0 04/26/2019 1341   PLT 147 (L) 06/06/2018 0000   MCV 94.8 04/26/2019 1341   MCV 95 06/06/2018 0000   MCH 32.1 06/06/2018 0000   MCH 31.1 12/14/2015 1122   MCHC 34.2 04/26/2019 1341   RDW 13.0 04/26/2019 1341   RDW 14.2 06/06/2018 0000   LYMPHSABS 2.0 04/26/2019 1341   LYMPHSABS 1.4 06/06/2018 0000   MONOABS 0.6 04/26/2019 1341   EOSABS 0.1 04/26/2019 1341   EOSABS 0.1 06/06/2018 0000   BASOSABS 0.0 04/26/2019 1341   BASOSABS 0.0 06/06/2018 0000   Iron/TIBC/Ferritin/ %Sat No results found for: IRON, TIBC, FERRITIN, IRONPCTSAT Lipid Panel     Component Value Date/Time   CHOL 124 09/12/2018 0837   TRIG 137 09/12/2018 0837  TRIG 256 09/14/2009 0000   HDL 43 09/12/2018 0837   CHOLHDL 2.9 09/12/2018 0837   CHOLHDL 3 12/07/2016 0852   VLDL 30.8 12/07/2016 0852   LDLCALC 54 09/12/2018 0837   LDLDIRECT 68.0 04/13/2015 0823   Hepatic Function Panel     Component Value Date/Time   PROT 6.9 12/10/2018 1353   ALBUMIN 4.9 (H) 12/10/2018 1353   AST 31 12/10/2018 1353   ALT 34 12/10/2018 1353   ALKPHOS 73 12/10/2018 1353   BILITOT 0.6 12/10/2018 1353      Component Value Date/Time   TSH 3.250 12/10/2018 1353   TSH 4.140 08/06/2018 1148   TSH 4.010 01/31/2018 1158      I, Trixie Dredge, am acting as Location manager for [name], MD I have reviewed the above documentation for accuracy and completeness, and I agree with the above. -[name], MD

## 2019-05-01 ENCOUNTER — Inpatient Hospital Stay: Admission: RE | Admit: 2019-05-01 | Payer: Medicare HMO | Source: Ambulatory Visit

## 2019-05-06 ENCOUNTER — Other Ambulatory Visit (INDEPENDENT_AMBULATORY_CARE_PROVIDER_SITE_OTHER): Payer: Self-pay | Admitting: Family Medicine

## 2019-05-06 DIAGNOSIS — E038 Other specified hypothyroidism: Secondary | ICD-10-CM

## 2019-05-06 DIAGNOSIS — F3289 Other specified depressive episodes: Secondary | ICD-10-CM

## 2019-05-08 ENCOUNTER — Ambulatory Visit: Payer: Medicare HMO | Admitting: Vascular Surgery

## 2019-05-15 ENCOUNTER — Other Ambulatory Visit: Payer: Self-pay

## 2019-05-15 ENCOUNTER — Ambulatory Visit
Admission: RE | Admit: 2019-05-15 | Discharge: 2019-05-15 | Disposition: A | Discharge status: Home / Self Care | Payer: Medicare HMO | Source: Ambulatory Visit | Attending: Family | Admitting: Family

## 2019-05-15 DIAGNOSIS — I714 Abdominal aortic aneurysm, without rupture, unspecified: Secondary | ICD-10-CM

## 2019-05-15 DIAGNOSIS — I723 Aneurysm of iliac artery: Secondary | ICD-10-CM | POA: Diagnosis not present

## 2019-05-15 MED ORDER — IOPAMIDOL (ISOVUE-370) INJECTION 76%
75.0000 mL | Freq: Once | INTRAVENOUS | Status: AC | PRN
  Administered 2019-05-15: 75 mL via INTRAVENOUS

## 2019-05-17 DIAGNOSIS — E038 Other specified hypothyroidism: Secondary | ICD-10-CM | POA: Diagnosis not present

## 2019-05-17 DIAGNOSIS — E559 Vitamin D deficiency, unspecified: Secondary | ICD-10-CM | POA: Diagnosis not present

## 2019-05-18 LAB — VITAMIN D 25 HYDROXY (VIT D DEFICIENCY, FRACTURES): Vit D, 25-Hydroxy: 81 ng/mL (ref 30.0–100.0)

## 2019-05-18 LAB — T4, FREE: Free T4: 1.04 ng/dL (ref 0.82–1.77)

## 2019-05-18 LAB — T3: T3, Total: 81 ng/dL (ref 71–180)

## 2019-05-18 LAB — TSH: TSH: 3.83 u[IU]/mL (ref 0.450–4.500)

## 2019-05-22 ENCOUNTER — Other Ambulatory Visit: Payer: Self-pay

## 2019-05-22 ENCOUNTER — Ambulatory Visit (INDEPENDENT_AMBULATORY_CARE_PROVIDER_SITE_OTHER): Payer: Medicare HMO | Admitting: Vascular Surgery

## 2019-05-22 ENCOUNTER — Encounter: Payer: Self-pay | Admitting: Vascular Surgery

## 2019-05-22 VITALS — BP 134/71

## 2019-05-22 DIAGNOSIS — I714 Abdominal aortic aneurysm, without rupture, unspecified: Secondary | ICD-10-CM

## 2019-05-22 DIAGNOSIS — Z95828 Presence of other vascular implants and grafts: Secondary | ICD-10-CM | POA: Diagnosis not present

## 2019-05-22 NOTE — Progress Notes (Signed)
Patient name: Philip [name] DOB: 11-27-1940 Sex: male    Referring Provider is Colon Branch, MD  PCP is Colon Branch, MD  REASON FOR VIRTUAL VISIT: Follow-up of aneurysmal disease  I connected with Philip [name] on 05/22/19 at  9:40 AM EDT by a video enabled telemedicine application and verified that I am speaking with the correct person using two identifiers. I discussed the limitations of evaluation and management by telemedicine and the availability of in person appointments. The patient expressed understanding and agreed to proceed.  Location: Patient: Home Provider: Office  HPI: Philip [name] is a 78 y.o. 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  I last saw the patient on 03/01/2017.  At that time the maximum diameter of the aneurysm was 6.7 cm.  The right hypogastric artery was 1.4 cm in maximum diameter.  The left hypogastric artery was 2.2 cm in maximum diameter.  I attempted to connect via video but this was unsuccessful so this was a phone visit.  He has no specific complaints.  He denies abdominal pain or back pain.  He denies claudication or rest pain.  Current Outpatient Medications  Medication Sig Dispense Refill  . atorvastatin (LIPITOR) 40 MG tablet Take 1 tablet (40 mg total) by mouth at bedtime. 90 tablet 1  . B Complex-Biotin-FA (B-COMPLEX PO) Take 1 tablet by mouth daily.    Marland Kitchen buPROPion (WELLBUTRIN SR) 150 MG 12 hr tablet Take 1 tablet (150 mg total) by mouth daily. 30 tablet 0  . Cholecalciferol (VITAMIN D3) 5000 UNITS CAPS Take 5,000 Units by mouth daily.    Marland Kitchen co-enzyme Q-10 30 MG capsule Take 30 mg by mouth daily.    . diclofenac sodium (VOLTAREN) 1 % GEL Apply 4 g topically 4 (four) times daily as needed. 100 g 3  . ELIQUIS 5 MG TABS tablet TAKE 1 TABLET TWICE DAILY 180 tablet 1  . hydrocortisone 2.5 % cream Apply 1 application topically as needed.    Marland Kitchen levothyroxine (SYNTHROID) 25 MCG tablet Take 1 tablet (25 mcg  total) by mouth daily before breakfast. 30 tablet 0  . lisinopril (PRINIVIL,ZESTRIL) 10 MG tablet TAKE 1 TABLET EVERY DAY 90 tablet 3  . magnesium oxide (MAG-OX) 400 MG tablet Take 800 mg by mouth daily.    . Multiple Vitamins-Minerals (MULTIVITAMIN WITH MINERALS) tablet Take 1 tablet by mouth daily.    . Omega-3 Fatty Acids (OMEGA-3 FISH OIL) 1200 MG CAPS Take 2 1,200 mg capsules daily 90 capsule 3  . polyethylene glycol (MIRALAX / GLYCOLAX) packet Take 17 g by mouth daily.    . tamsulosin (FLOMAX) 0.4 MG CAPS capsule Take 1 capsule (0.4 mg total) by mouth daily after supper. 30 capsule 6  . amLODipine (NORVASC) 5 MG tablet TAKE 1 TABLET (5 MG TOTAL) BY MOUTH DAILY. 90 tablet 3   No current facility-administered medications for this visit.    REVIEW OF SYSTEMS: Valu.Nieves ] denotes positive finding; [  ] denotes negative finding  CARDIOVASCULAR:  [ ]  chest pain   [ ]  dyspnea on exertion  [ ]  leg swelling  CONSTITUTIONAL:  [ ]  fever   [ ]  chills   OBSERVATIONS/OBJECTIVE: Vitals:   05/22/19 0934  BP: 134/71   DATA:  CT abdomen pelvis: I reviewed his CT abdomen pelvis that was done on 05/15/2019.  By my measurement the aneurysm remains stable at 6.7 cm in maximum diameter.  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  Thus these are stable in size.  MEDICAL ISSUES:  STATUS POST ENDOVASCULAR ANEURYSM REPAIR: The CT scan shows that the aneurysm remains stable in size with the graft in good position.  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  Unfortunately this will be hard to follow with ultrasound so I think he will need a follow-up CT scan in 1 year.  If this did enlarge we would have to consider coil embolization of the left hypogastric artery.  Of note he has small adrenal adenomas which are also stable in size.  We will  arrange for a follow-up CT of the abdomen pelvis in 1 year.  He knows to call sooner if he has problems.  FOLLOW UP INSTRUCTIONS:   I discussed the assessment and treatment plan with the patient. The patient was provided an opportunity to ask questions and all were answered. The patient agreed with the plan and demonstrated an understanding of the instructions. The patient was advised to call back or seek an in-person evaluation if the symptoms worsen or if the condition fails to improve as anticipated.  I provided 12 minutes of non-face-to-face time during this encounter.  Deitra Mayo [facility]

## 2019-05-29 ENCOUNTER — Encounter (INDEPENDENT_AMBULATORY_CARE_PROVIDER_SITE_OTHER): Payer: Self-pay

## 2019-05-29 ENCOUNTER — Telehealth (INDEPENDENT_AMBULATORY_CARE_PROVIDER_SITE_OTHER): Payer: Medicare HMO | Admitting: Family Medicine

## 2019-05-29 ENCOUNTER — Other Ambulatory Visit: Payer: Self-pay

## 2019-05-29 ENCOUNTER — Encounter (INDEPENDENT_AMBULATORY_CARE_PROVIDER_SITE_OTHER): Payer: Self-pay | Admitting: Family Medicine

## 2019-05-29 DIAGNOSIS — E669 Obesity, unspecified: Secondary | ICD-10-CM | POA: Diagnosis not present

## 2019-05-29 DIAGNOSIS — F3289 Other specified depressive episodes: Secondary | ICD-10-CM | POA: Diagnosis not present

## 2019-05-29 DIAGNOSIS — E039 Hypothyroidism, unspecified: Secondary | ICD-10-CM

## 2019-05-29 DIAGNOSIS — Z6830 Body mass index (BMI) 30.0-30.9, adult: Secondary | ICD-10-CM

## 2019-05-29 DIAGNOSIS — E559 Vitamin D deficiency, unspecified: Secondary | ICD-10-CM | POA: Diagnosis not present

## 2019-05-29 MED ORDER — BUPROPION HCL ER (SR) 150 MG PO TB12: 150.0000 mg | ORAL_TABLET | Freq: Every day | ORAL | 0 refills | Status: DC

## 2019-05-29 MED ORDER — LEVOTHYROXINE SODIUM 25 MCG PO TABS: 25.0000 ug | ORAL_TABLET | Freq: Every day | ORAL | 0 refills | Status: DC

## 2019-05-30 NOTE — Progress Notes (Signed)
Office: [phone]  /  Fax: [phone] TeleHealth Visit:  Philip [name] has verbally consented to this TeleHealth visit today. The patient is located at home, the provider is located at the [employer] [facility] office. The participants in this visit include the listed provider and patient and any and all parties involved. The visit was conducted today via FaceTime.  HPI:   Chief Complaint: OBESITY Philip [name] is here to discuss his progress with his obesity treatment plan. He is on the keep a food journal with 1700 to 1800 calories and 90+ grams of protein daily plan and is following his eating plan approximately 50 % of the time. He states he is doing cardio exercise 60 minutes 6 times per week. Philip [name] states he weighs 206 pounds today, and he has gained weight since his last visit. He is not journaling regularly, and he would like to look at other options. We were unable to weigh the patient today for this TeleHealth visit. He feels as if he has gained weight since his last visit. He has lost 22 lbs since starting treatment with [place].  Depression with emotional eating behaviors Philip [name] struggles with emotional eating and using food for comfort to the extent that it is negatively impacting his health. He often snacks when he is not hungry. Philip [name] sometimes feels he is out of control and then feels guilty that he made poor food choices. He is stable on Wellbutrin and he denies insomnia or palpitations. He is doing well avoiding stress, comfort and boredom eating. He has been working on behavior modification techniques to help reduce his emotional eating and has been somewhat successful. He shows no sign of suicidal or homicidal ideations.  Hypothyroidism Philip [name] has a diagnosis of hypothyroidism. He is stable on levothyroxine, and his labs are at goal. He denies hot or cold intolerance.  Vitamin D deficiency Philip [name] has a diagnosis of vitamin D deficiency. Philip [name] is at goal on vit D, and he is  at risk of over-replacement. He is on OTC vitamin D 5,000 IU daily. He denies nausea, vomiting or muscle weakness.  ASSESSMENT AND PLAN:  Class 1 obesity with serious comorbidity and body mass index (BMI) of 30.0 to 30.9 in adult, unspecified obesity type  Acquired hypothyroidism - Plan: levothyroxine (SYNTHROID) 25 MCG tablet  Other depression - with emotional eating - Plan: buPROPion (WELLBUTRIN SR) 150 MG 12 hr tablet  Vitamin D deficiency  PLAN:  Depression with Emotional Eating Behaviors We discussed behavior modification techniques today to help Philip [name] deal with his emotional eating and depression. He has agreed to continue Wellbutrin SR 150 mg daily #30 with no refills and follow up as directed.  Hypothyroidism Philip [name] was informed of the importance of good thyroid control to help with weight loss efforts. He was also informed that supertheraputic thyroid levels are dangerous and will not improve weight loss results. Philip [name] agrees to continue levothyroxine 25 mcg daily #30 with no refills and follow up as directed.  Vitamin D Deficiency Philip [name] was informed that low vitamin D levels contributes to fatigue and are associated with obesity, breast, and colon cancer. He agrees to decrease OTC vitamin D to 3 times a week and will follow up for routine testing of vitamin D, at least 2-3 times per year. He was informed of the risk of over-replacement of vitamin D and agrees to not increase his dose unless he discusses this with [place] first. Philip [name] agrees to follow up with our clinic in 4 weeks.  Obesity Philip [name] is currently in the action stage of change. As such, his goal is to continue with weight loss efforts He has agreed to keep a food journal with 1700 to 1800 calories and 100+ grams of protein daily or follow a lower carbohydrate, vegetable and lean protein rich diet plan Philip [name] has been instructed to work up to a goal of 150 minutes of combined cardio and strengthening exercise per week  for weight loss and overall health benefits. We discussed the following Behavioral Modification Strategies today: increasing lean protein intake, decreasing simple carbohydrates and work on meal planning and easy cooking plans  Philip [name] has agreed to follow up with our clinic in 4 weeks. He was informed of the importance of frequent follow up visits to maximize his success with intensive lifestyle modifications for his multiple health conditions.  ALLERGIES: No Known Allergies  MEDICATIONS: Current Outpatient Medications on File Prior to Visit  Medication Sig Dispense Refill   atorvastatin (LIPITOR) 40 MG tablet Take 1 tablet (40 mg total) by mouth at bedtime. 90 tablet 1   B Complex-Biotin-FA (B-COMPLEX PO) Take 1 tablet by mouth daily.     Cholecalciferol (VITAMIN D3) 5000 UNITS CAPS Take 5,000 Units by mouth daily.     co-enzyme Q-10 30 MG capsule Take 30 mg by mouth daily.     ELIQUIS 5 MG TABS tablet TAKE 1 TABLET TWICE DAILY 180 tablet 1   hydrocortisone 2.5 % cream Apply 1 application topically as needed.     lisinopril (PRINIVIL,ZESTRIL) 10 MG tablet TAKE 1 TABLET EVERY DAY 90 tablet 3   magnesium oxide (MAG-OX) 400 MG tablet Take 800 mg by mouth daily.     Multiple Vitamins-Minerals (MULTIVITAMIN WITH MINERALS) tablet Take 1 tablet by mouth daily.     Omega-3 Fatty Acids (OMEGA-3 FISH OIL) 1200 MG CAPS Take 2 1,200 mg capsules daily 90 capsule 3   polyethylene glycol (MIRALAX / GLYCOLAX) packet Take 17 g by mouth daily.     tamsulosin (FLOMAX) 0.4 MG CAPS capsule Take 1 capsule (0.4 mg total) by mouth daily after supper. 30 capsule 6   amLODipine (NORVASC) 5 MG tablet TAKE 1 TABLET (5 MG TOTAL) BY MOUTH DAILY. 90 tablet 3   No current facility-administered medications on file prior to visit.     PAST MEDICAL HISTORY: Past Medical History:  Diagnosis Date   AAA (abdominal aortic aneurysm) (HCC)    Atrial fibrillation (HCC)    CAD (coronary artery disease)     had a MI , s/p CABG   Cataracts, bilateral    immature   CHF (congestive heart failure) ([place])    Dysrhythmia    paroximal a fib   GERD (gastroesophageal reflux disease)    takes Omeprazole daily   History of colon polyps    History of kidney stones    History of shingles    Hyperlipidemia    Hypertension    takes Amlodipine,Metoprolol,and Lisinopril daily   Joint pain    Joint swelling    Myocardial infarction ([place])    several with last one being 2001(but never knew about any except 2001)   OSA (obstructive sleep apnea)    started CPAP 12/09   Peripheral vascular disease ([place])    Pneumonia    as a child   Skin cancer    several , one of them was melanoma (sees derm routinely)   Tingling    feet occasionally   Tinnitus     PAST SURGICAL HISTORY: Past Surgical  History:  Procedure Laterality Date   ABDOMINAL AORTAGRAM N/A 06/16/2014   Procedure: ABDOMINAL Maxcine Ham;  Surgeon: Angelia Mould, MD;  [facility];  Service: Cardiovascular;  Laterality: N/A;   ABDOMINAL AORTIC ENDOVASCULAR STENT GRAFT N/A 07/08/2014   Procedure: ABDOMINAL AORTIC ENDOVASCULAR STENT GRAFT/ GORE;  Surgeon: Angelia Mould, MD;  Location: [place];  Service: Vascular;  Laterality: N/A;   CARDIAC CATHETERIZATION  2015   COLONOSCOPY     CORONARY ARTERY BYPASS GRAFT  1999   x 3 vessels   LEFT HEART CATHETERIZATION WITH CORONARY/GRAFT ANGIOGRAM N/A 05/29/2014   Procedure: LEFT HEART CATHETERIZATION WITH [name];  Surgeon: [name], MD;  Location: [facility] CATH LAB;  Service: Cardiovascular;  Laterality: N/A;   PENILE PROSTHESIS IMPLANT  08/2005   RHINOPLASTY  1975    SOCIAL HISTORY: Social History   Tobacco Use   Smoking status: Former Smoker    Types: Cigarettes    Quit date: 1979    Years since quitting: 41.6   Smokeless tobacco: Never Used   Tobacco comment: quit smoking in 1979  Substance Use Topics   Alcohol use: Yes     Comment: occasionally - once per week   Drug use: No    FAMILY HISTORY: Family History  Problem Relation Age of Onset   Prostate cancer Father 48   Heart disease Father    Skin cancer Father    Heart attack Father    Cancer Father    Deep vein thrombosis Father    Hyperlipidemia Father    Hypertension Father    Heart disease Mother    Skin cancer Mother    Heart attack Mother    Cancer Mother    Hyperlipidemia Mother    Hypertension Mother    Varicose Veins Mother    Peripheral vascular disease Mother        amputation   Sleep apnea Brother    Hyperlipidemia Brother    Hypertension Brother    Prostate cancer Brother 2   Colon cancer Other        aunt   Skin cancer Brother    Skin cancer Sister    Cancer Sister    Heart disease Sister    Diabetes Neg Hx    Stroke Neg Hx     ROS: Review of Systems  Constitutional: Negative for weight loss.  Cardiovascular: Negative for palpitations.  Gastrointestinal: Negative for nausea and vomiting.  Musculoskeletal:       Negative for muscle weakness  Endo/Heme/Allergies:       Negative for hot or cold intolerance  Psychiatric/Behavioral: Positive for depression. Negative for suicidal ideas. The patient does not have insomnia.     PHYSICAL EXAM: Pt in no acute distress  RECENT LABS AND TESTS: BMET    Component Value Date/Time   NA 141 04/26/2019 1341   NA 142 12/10/2018 1353   K 4.4 04/26/2019 1341   CL 104 04/26/2019 1341   CO2 30 04/26/2019 1341   GLUCOSE 81 04/26/2019 1341   BUN 21 04/26/2019 1341   BUN 18 12/10/2018 1353   CREATININE 0.95 04/26/2019 1341   CREATININE 1.08 12/14/2015 1122   CALCIUM 9.3 04/26/2019 1341   GFRNONAA 60 12/10/2018 1353   GFRAA 69 12/10/2018 1353   Lab Results  Component Value Date   HGBA1C 5.8 (H) 12/10/2018   HGBA1C 5.8 (H) 06/06/2018   HGBA1C 5.6 08/14/2017   HGBA1C 5.7 (H) 04/19/2017   HGBA1C 5.9 (H) 01/18/2017  Lab Results  Component  Value Date   INSULIN 15.4 12/10/2018   INSULIN 9.6 06/06/2018   INSULIN 10.8 08/14/2017   INSULIN 17.3 04/19/2017   INSULIN 16.1 01/18/2017   CBC    Component Value Date/Time   WBC 5.2 04/26/2019 1341   RBC 4.57 04/26/2019 1341   HGB 14.8 04/26/2019 1341   HGB 14.6 06/06/2018 0000   HCT 43.3 04/26/2019 1341   HCT 43.0 06/06/2018 0000   PLT 156.0 04/26/2019 1341   PLT 147 (L) 06/06/2018 0000   MCV 94.8 04/26/2019 1341   MCV 95 06/06/2018 0000   MCH 32.1 06/06/2018 0000   MCH 31.1 12/14/2015 1122   MCHC 34.2 04/26/2019 1341   RDW 13.0 04/26/2019 1341   RDW 14.2 06/06/2018 0000   LYMPHSABS 2.0 04/26/2019 1341   LYMPHSABS 1.4 06/06/2018 0000   MONOABS 0.6 04/26/2019 1341   EOSABS 0.1 04/26/2019 1341   EOSABS 0.1 06/06/2018 0000   BASOSABS 0.0 04/26/2019 1341   BASOSABS 0.0 06/06/2018 0000   Iron/TIBC/Ferritin/ %Sat No results found for: IRON, TIBC, FERRITIN, IRONPCTSAT Lipid Panel     Component Value Date/Time   CHOL 124 09/12/2018 0837   TRIG 137 09/12/2018 0837   TRIG 256 09/14/2009 0000   HDL 43 09/12/2018 0837   CHOLHDL 2.9 09/12/2018 0837   CHOLHDL 3 12/07/2016 0852   VLDL 30.8 12/07/2016 0852   LDLCALC 54 09/12/2018 0837   LDLDIRECT 68.0 04/13/2015 0823   Hepatic Function Panel     Component Value Date/Time   PROT 6.9 12/10/2018 1353   ALBUMIN 4.9 (H) 12/10/2018 1353   AST 31 12/10/2018 1353   ALT 34 12/10/2018 1353   ALKPHOS 73 12/10/2018 1353   BILITOT 0.6 12/10/2018 1353      Component Value Date/Time   TSH 3.830 05/17/2019 0821   TSH 3.250 12/10/2018 1353   TSH 4.140 08/06/2018 1148     Ref. Range 05/17/2019 08:21  Vitamin D, 25-Hydroxy Latest Ref Range: 30.0 - 100.0 ng/mL 81.0    I, Doreene Nest, am acting as Location manager for [name], MD I have reviewed the above documentation for accuracy and completeness, and I agree with the above. -[name], MD

## 2019-06-07 ENCOUNTER — Other Ambulatory Visit: Payer: Self-pay | Admitting: Interventional Cardiology

## 2019-06-07 DIAGNOSIS — R5383 Other fatigue: Secondary | ICD-10-CM

## 2019-06-07 DIAGNOSIS — R0609 Other forms of dyspnea: Secondary | ICD-10-CM

## 2019-06-07 DIAGNOSIS — I2510 Atherosclerotic heart disease of native coronary artery without angina pectoris: Secondary | ICD-10-CM

## 2019-06-10 ENCOUNTER — Other Ambulatory Visit (INDEPENDENT_AMBULATORY_CARE_PROVIDER_SITE_OTHER): Payer: Self-pay | Admitting: Family Medicine

## 2019-06-10 ENCOUNTER — Encounter (INDEPENDENT_AMBULATORY_CARE_PROVIDER_SITE_OTHER): Payer: Self-pay | Admitting: Family Medicine

## 2019-06-10 DIAGNOSIS — E039 Hypothyroidism, unspecified: Secondary | ICD-10-CM

## 2019-06-10 MED ORDER — LEVOTHYROXINE SODIUM 25 MCG PO TABS: 25.0000 ug | ORAL_TABLET | Freq: Every day | ORAL | 0 refills | Status: DC

## 2019-06-26 ENCOUNTER — Telehealth (INDEPENDENT_AMBULATORY_CARE_PROVIDER_SITE_OTHER): Payer: Medicare HMO | Admitting: Family Medicine

## 2019-06-26 ENCOUNTER — Encounter (INDEPENDENT_AMBULATORY_CARE_PROVIDER_SITE_OTHER): Payer: Self-pay | Admitting: Family Medicine

## 2019-06-26 ENCOUNTER — Other Ambulatory Visit: Payer: Self-pay

## 2019-06-26 DIAGNOSIS — F3289 Other specified depressive episodes: Secondary | ICD-10-CM

## 2019-06-26 DIAGNOSIS — Z6830 Body mass index (BMI) 30.0-30.9, adult: Secondary | ICD-10-CM | POA: Diagnosis not present

## 2019-06-26 DIAGNOSIS — E669 Obesity, unspecified: Secondary | ICD-10-CM

## 2019-06-26 DIAGNOSIS — E039 Hypothyroidism, unspecified: Secondary | ICD-10-CM

## 2019-06-26 MED ORDER — LEVOTHYROXINE SODIUM 25 MCG PO TABS: 25.0000 ug | ORAL_TABLET | Freq: Every day | ORAL | 0 refills | Status: DC

## 2019-06-26 MED ORDER — BUPROPION HCL ER (SR) 150 MG PO TB12: 150.0000 mg | ORAL_TABLET | Freq: Every day | ORAL | 0 refills | Status: DC

## 2019-06-27 NOTE — Progress Notes (Signed)
Office: [phone]  /  Fax: [phone] TeleHealth Visit:  Philip [name] has verbally consented to this TeleHealth visit today. The patient is located at home, the provider is located at the [employer] [facility] office. The participants in this visit include the listed provider and patient. The visit was conducted today via [email or web address].  HPI:   Chief Complaint: OBESITY Philip [name] is here to discuss his progress with his obesity treatment plan. He is on the keep a food journal with 1700-1800 calories and 100+ grams of protein daily or follow a lower carbohydrate, vegetable and lean protein rich diet plan and is following his eating plan approximately 90 % of the time. He states he is walking for 60 minutes 7 times per week, and also doing pushups and yard work. Philip [name] feels he is doing well with weight loss and states his weight this morning was 199 lbs. Which is down 7 lbs from his last visit 1 month ago.  We were unable to weigh the patient today for this TeleHealth visit. He feels as if he has lost 7 lbs since his last visit. He has lost 22 lbs since starting treatment with [place].  Hypothyroidism Philip [name] has a diagnosis of hypothyroidism. He is stable on levothyroxine. He denies chest pain, hot or cold intolerance or palpitations.  Depression with Emotional Eating Behaviors Philip [name]'s mood is stable on Wellbutrin. His blood pressure is elevated today at 154/77, but he hadn't taken his medications yet. Philip [name] struggles with emotional eating and using food for comfort to the extent that it is negatively impacting his health. He often snacks when he is not hungry. Philip [name] sometimes feels he is out of control and then feels guilty that he made poor food choices. He has been working on behavior modification techniques to help reduce his emotional eating and has been somewhat successful. He shows no sign of suicidal or homicidal ideations.  Depression screen [facility] 2/9 09/03/2018 01/18/2017 12/06/2016 12/21/2015  06/19/2015  Decreased Interest 0 3 1 0 0  Down, Depressed, Hopeless 0 1 0 0 0  PHQ - 2 Score 0 4 1 0 0  Altered sleeping - 2 - - -  Tired, decreased energy - 2 - - -  Change in appetite - 1 - - -  Feeling bad or failure about yourself  - 2 - - -  Trouble concentrating - 1 - - -  Moving slowly or fidgety/restless - 1 - - -  Suicidal thoughts - 0 - - -  PHQ-9 Score - 13 - - -    ASSESSMENT AND PLAN:  Class 1 obesity with serious comorbidity and body mass index (BMI) of 30.0 to 30.9 in adult, unspecified obesity type  Acquired hypothyroidism - Plan: levothyroxine (SYNTHROID) 25 MCG tablet  Other depression - with emotional eating - Plan: buPROPion (WELLBUTRIN SR) 150 MG 12 hr tablet  PLAN:  Hypothyroidism Philip [name] was informed of the importance of good thyroid control to help with weight loss efforts. He was also informed that supertheraputic thyroid levels are dangerous and will not improve weight loss results. Philip [name] agrees to continue taking levothyroxine 25 mcg qhs before bed #30 and we will refill for 1 month. Philip [name] agrees to follow up with our clinic in 5 weeks.  Depression with Emotional Eating Behaviors We discussed behavior modification techniques today to help Philip [name] deal with his emotional eating and depression. Philip [name] agrees to continue taking Wellbutrin SR 150 mg qd #30 and we will refill for 1 month. [facility] agrees  to follow up with our clinic in 5 weeks.  Obesity Philip [name] is currently in the action stage of change. As such, his goal is to continue with weight loss efforts He has agreed to follow a lower carbohydrate, vegetable and lean protein rich diet plan Philip [name] has been instructed to work up to a goal of 150 minutes of combined cardio and strengthening exercise per week for weight loss and overall health benefits. We discussed the following Behavioral Modification Strategies today: increasing lean protein intake and decrease eating out   Philip [name] has agreed to follow up  with our clinic in 5 weeks. He was informed of the importance of frequent follow up visits to maximize his success with intensive lifestyle modifications for his multiple health conditions.  ALLERGIES: No Known Allergies  MEDICATIONS: Current Outpatient Medications on File Prior to Visit  Medication Sig Dispense Refill  . atorvastatin (LIPITOR) 40 MG tablet TAKE 1 TABLET (40 MG TOTAL) BY MOUTH AT BEDTIME. 90 tablet 2  . B Complex-Biotin-FA (B-COMPLEX PO) Take 1 tablet by mouth daily.    . Cholecalciferol (VITAMIN D3) 5000 UNITS CAPS Take 5,000 Units by mouth daily.    Marland Kitchen co-enzyme Q-10 30 MG capsule Take 30 mg by mouth daily.    Marland Kitchen ELIQUIS 5 MG TABS tablet TAKE 1 TABLET TWICE DAILY 180 tablet 1  . hydrocortisone 2.5 % cream Apply 1 application topically as needed.    Marland Kitchen lisinopril (PRINIVIL,ZESTRIL) 10 MG tablet TAKE 1 TABLET EVERY DAY 90 tablet 3  . magnesium oxide (MAG-OX) 400 MG tablet Take 800 mg by mouth daily.    . Multiple Vitamins-Minerals (MULTIVITAMIN WITH MINERALS) tablet Take 1 tablet by mouth daily.    . Omega-3 Fatty Acids (OMEGA-3 FISH OIL) 1200 MG CAPS Take 2 1,200 mg capsules daily 90 capsule 3  . polyethylene glycol (MIRALAX / GLYCOLAX) packet Take 17 g by mouth daily.    . tamsulosin (FLOMAX) 0.4 MG CAPS capsule Take 1 capsule (0.4 mg total) by mouth daily after supper. 30 capsule 6  . amLODipine (NORVASC) 5 MG tablet TAKE 1 TABLET (5 MG TOTAL) BY MOUTH DAILY. 90 tablet 3   No current facility-administered medications on file prior to visit.     PAST MEDICAL HISTORY: Past Medical History:  Diagnosis Date  . AAA (abdominal aortic aneurysm) ([place])   . Atrial fibrillation ([place])   . CAD (coronary artery disease)    had a MI , s/p CABG  . Cataracts, bilateral    immature  . CHF (congestive heart failure) ([place])   . Dysrhythmia    paroximal a fib  . GERD (gastroesophageal reflux disease)    takes Omeprazole daily  . History of colon polyps   . History of kidney stones    . History of shingles   . Hyperlipidemia   . Hypertension    takes Amlodipine,Metoprolol,and Lisinopril daily  . Joint pain   . Joint swelling   . Myocardial infarction [facility])    several with last one being 2001(but never knew about any except 2001)  . OSA (obstructive sleep apnea)    started CPAP 12/09  . Peripheral vascular disease ([place])   . Pneumonia    as a child  . Skin cancer    several , one of them was melanoma (sees derm routinely)  . Tingling    feet occasionally  . Tinnitus     PAST SURGICAL HISTORY: Past Surgical History:  Procedure Laterality Date  . ABDOMINAL AORTAGRAM N/A 06/16/2014  Procedure: ABDOMINAL AORTAGRAM;  Surgeon: Angelia Mould, MD;  Location: [facility] CATH LAB;  Service: Cardiovascular;  Laterality: N/A;  . ABDOMINAL AORTIC ENDOVASCULAR STENT GRAFT N/A 07/08/2014   Procedure: ABDOMINAL AORTIC ENDOVASCULAR STENT GRAFT/ GORE;  Surgeon: Angelia Mould, MD;  Location: [place];  Service: Vascular;  Laterality: N/A;  . CARDIAC CATHETERIZATION  2015  . COLONOSCOPY    . CORONARY ARTERY BYPASS GRAFT  1999   x 3 vessels  . LEFT HEART CATHETERIZATION WITH CORONARY/GRAFT ANGIOGRAM N/A 05/29/2014   Procedure: LEFT HEART CATHETERIZATION WITH [name];  Surgeon: [name], MD;  Location: [facility] CATH LAB;  Service: Cardiovascular;  Laterality: N/A;  . PENILE PROSTHESIS IMPLANT  08/2005  . RHINOPLASTY  1975    SOCIAL HISTORY: Social History   Tobacco Use  . Smoking status: Former Smoker    Types: Cigarettes    Quit date: 1979    Years since quitting: 41.6  . Smokeless tobacco: Never Used  . Tobacco comment: quit smoking in 1979  Substance Use Topics  . Alcohol use: Yes    Comment: occasionally - once per week  . Drug use: No    FAMILY HISTORY: Family History  Problem Relation Age of Onset  . Prostate cancer Father 67  . Heart disease Father   . Skin cancer Father   . Heart attack Father   . Cancer Father   . Deep vein  thrombosis Father   . Hyperlipidemia Father   . Hypertension Father   . Heart disease Mother   . Skin cancer Mother   . Heart attack Mother   . Cancer Mother   . Hyperlipidemia Mother   . Hypertension Mother   . Varicose Veins Mother   . Peripheral vascular disease Mother        amputation  . Sleep apnea Brother   . Hyperlipidemia Brother   . Hypertension Brother   . Prostate cancer Brother 45  . Colon cancer Other        aunt  . Skin cancer Brother   . Skin cancer Sister   . Cancer Sister   . Heart disease Sister   . Diabetes Neg Hx   . Stroke Neg Hx     ROS: Review of Systems  Constitutional: Positive for weight loss.  Cardiovascular: Negative for chest pain and palpitations.  Endo/Heme/Allergies:       Negative hot/cold intolerance  Psychiatric/Behavioral: Positive for depression. Negative for suicidal ideas.    PHYSICAL EXAM: Pt in no acute distress  RECENT LABS AND TESTS: BMET    Component Value Date/Time   NA 141 04/26/2019 1341   NA 142 12/10/2018 1353   K 4.4 04/26/2019 1341   CL 104 04/26/2019 1341   CO2 30 04/26/2019 1341   GLUCOSE 81 04/26/2019 1341   BUN 21 04/26/2019 1341   BUN 18 12/10/2018 1353   CREATININE 0.95 04/26/2019 1341   CREATININE 1.08 12/14/2015 1122   CALCIUM 9.3 04/26/2019 1341   GFRNONAA 60 12/10/2018 1353   GFRAA 69 12/10/2018 1353   Lab Results  Component Value Date   HGBA1C 5.8 (H) 12/10/2018   HGBA1C 5.8 (H) 06/06/2018   HGBA1C 5.6 08/14/2017   HGBA1C 5.7 (H) 04/19/2017   HGBA1C 5.9 (H) 01/18/2017   Lab Results  Component Value Date   INSULIN 15.4 12/10/2018   INSULIN 9.6 06/06/2018   INSULIN 10.8 08/14/2017   INSULIN 17.3 04/19/2017   INSULIN 16.1 01/18/2017   CBC    Component Value  Date/Time   WBC 5.2 04/26/2019 1341   RBC 4.57 04/26/2019 1341   HGB 14.8 04/26/2019 1341   HGB 14.6 06/06/2018 0000   HCT 43.3 04/26/2019 1341   HCT 43.0 06/06/2018 0000   PLT 156.0 04/26/2019 1341   PLT 147 (L) 06/06/2018  0000   MCV 94.8 04/26/2019 1341   MCV 95 06/06/2018 0000   MCH 32.1 06/06/2018 0000   MCH 31.1 12/14/2015 1122   MCHC 34.2 04/26/2019 1341   RDW 13.0 04/26/2019 1341   RDW 14.2 06/06/2018 0000   LYMPHSABS 2.0 04/26/2019 1341   LYMPHSABS 1.4 06/06/2018 0000   MONOABS 0.6 04/26/2019 1341   EOSABS 0.1 04/26/2019 1341   EOSABS 0.1 06/06/2018 0000   BASOSABS 0.0 04/26/2019 1341   BASOSABS 0.0 06/06/2018 0000   Iron/TIBC/Ferritin/ %Sat No results found for: IRON, TIBC, FERRITIN, IRONPCTSAT Lipid Panel     Component Value Date/Time   CHOL 124 09/12/2018 0837   TRIG 137 09/12/2018 0837   TRIG 256 09/14/2009 0000   HDL 43 09/12/2018 0837   CHOLHDL 2.9 09/12/2018 0837   CHOLHDL 3 12/07/2016 0852   VLDL 30.8 12/07/2016 0852   LDLCALC 54 09/12/2018 0837   LDLDIRECT 68.0 04/13/2015 0823   Hepatic Function Panel     Component Value Date/Time   PROT 6.9 12/10/2018 1353   ALBUMIN 4.9 (H) 12/10/2018 1353   AST 31 12/10/2018 1353   ALT 34 12/10/2018 1353   ALKPHOS 73 12/10/2018 1353   BILITOT 0.6 12/10/2018 1353      Component Value Date/Time   TSH 3.830 05/17/2019 0821   TSH 3.250 12/10/2018 1353   TSH 4.140 08/06/2018 1148      I, Trixie Dredge, am acting as Location manager for [name], MD I have reviewed the above documentation for accuracy and completeness, and I agree with the above. -[name], MD

## 2019-07-02 ENCOUNTER — Telehealth: Payer: Self-pay | Admitting: *Deleted

## 2019-07-02 NOTE — Telephone Encounter (Signed)
I called patient to update current status and complete clearance.  No answer.  Left voicemail.

## 2019-07-02 NOTE — Telephone Encounter (Signed)
   Primary Cardiologist: Nelva Bush, MD  Chart reviewed as part of pre-operative protocol coverage. Patient was contacted 07/02/2019 in reference to pre-operative risk assessment for pending surgery as outlined below.  SHAMUS BOGGS was last seen on 02/04/2019 by Dr. Irish Lack.  Since that day, DARE TULIP has done well since that visit.  He has a medical history including CAD s/p CABG 1999, AAA repair 2015, PAF on Eliquis.  Active walking 8000-12,000 steps daily.  Therefore, based on ACC/AHA guidelines, the patient would be at acceptable risk for the planned procedure without further cardiovascular testing.   According to our pharmacy protocol: Patient with diagnosis of paroxysmal Afib on Eliquis 5 mg BID (appropriate dose) for anticoagulation.    Procedure: Colonoscopy Date of procedure: 08/16/19  CHADS2-VASc score of 5 (HTN, AGEx2, CAD, CHF (noted in [place] (EF normal 12/2017))  CrCl 71.6 mg/dL (adjBW; 04/26/19) Platelet count 156 (04/26/19)  Per office protocol, patient can hold Eliquis for 1-2 days prior to procedure.     I will route this recommendation to the requesting party via Epic fax function and remove from pre-op pool.  Please call with questions.  Daune Perch, NP 07/02/2019, 2:05 PM

## 2019-07-02 NOTE — Telephone Encounter (Signed)
Patient with diagnosis of paroxysmal Afib on Eliquis 5 mg BID (appropriate dose) for anticoagulation.    Procedure: Colonoscopy Date of procedure: 08/16/19  CHADS2-VASc score of 5 (HTN, AGEx2, CAD, CHF (noted in [place] (EF normal 12/2017))   CrCl 71.6 mg/dL (adjBW; 04/26/19) Platelet count 156 (04/26/19)  Per office protocol, patient can hold Eliquis for 1-2 days prior to procedure.

## 2019-07-02 NOTE — Telephone Encounter (Signed)
   [facility] Pre-operative Risk Assessment    Request for surgical clearance:  1. What type of surgery is being performed? COLONOSCOPY   2. When is this surgery scheduled? 08/16/19   3. What type of clearance is required (medical clearance vs. Pharmacy clearance to hold med vs. Both)? BOTH  4. Are there any medications that need to be held prior to surgery and how long? ELIQUIS   5. Practice name and name of physician performing surgery? [facility]; DR. Michail Sermon   6. What is your office phone number [phone]    7.   What is your office fax number [phone]  8.   Anesthesia type (None, local, MAC, general) ? PROPOFOL   Julaine Hua 07/02/2019, 9:21 AM  _________________________________________________________________   (provider comments below)

## 2019-07-09 ENCOUNTER — Other Ambulatory Visit: Payer: Self-pay | Admitting: Internal Medicine

## 2019-07-09 DIAGNOSIS — R5383 Other fatigue: Secondary | ICD-10-CM

## 2019-07-09 DIAGNOSIS — R0609 Other forms of dyspnea: Secondary | ICD-10-CM

## 2019-07-09 DIAGNOSIS — I2510 Atherosclerotic heart disease of native coronary artery without angina pectoris: Secondary | ICD-10-CM

## 2019-07-10 NOTE — Telephone Encounter (Signed)
Please review for refill.  

## 2019-07-31 ENCOUNTER — Other Ambulatory Visit: Payer: Self-pay

## 2019-07-31 ENCOUNTER — Telehealth (INDEPENDENT_AMBULATORY_CARE_PROVIDER_SITE_OTHER): Payer: Medicare HMO | Admitting: Family Medicine

## 2019-07-31 ENCOUNTER — Encounter (INDEPENDENT_AMBULATORY_CARE_PROVIDER_SITE_OTHER): Payer: Self-pay | Admitting: Family Medicine

## 2019-07-31 DIAGNOSIS — E039 Hypothyroidism, unspecified: Secondary | ICD-10-CM | POA: Diagnosis not present

## 2019-07-31 DIAGNOSIS — I10 Essential (primary) hypertension: Secondary | ICD-10-CM | POA: Diagnosis not present

## 2019-07-31 DIAGNOSIS — Z6830 Body mass index (BMI) 30.0-30.9, adult: Secondary | ICD-10-CM

## 2019-07-31 DIAGNOSIS — F3289 Other specified depressive episodes: Secondary | ICD-10-CM

## 2019-07-31 DIAGNOSIS — E669 Obesity, unspecified: Secondary | ICD-10-CM | POA: Diagnosis not present

## 2019-07-31 MED ORDER — BUPROPION HCL ER (SR) 150 MG PO TB12: 150.0000 mg | ORAL_TABLET | Freq: Every day | ORAL | 0 refills | Status: DC

## 2019-07-31 MED ORDER — LEVOTHYROXINE SODIUM 25 MCG PO TABS: 25.0000 ug | ORAL_TABLET | Freq: Every day | ORAL | 0 refills | Status: DC

## 2019-07-31 NOTE — Progress Notes (Signed)
Office: [phone]  /  Fax: [phone] TeleHealth Visit:  Philip [name] has verbally consented to this TeleHealth visit today. The patient is located at home, the provider is located at the [employer] [facility] office. The participants in this visit include the listed provider and patient. Philip [name] was unable to use Face Time today and the telehealth visit was conducted via telephone.  HPI:   Chief Complaint: OBESITY Philip [name] is here to discuss his progress with his obesity treatment plan. He is on the lower carbohydrate, vegetable, and lean protein rich diet plan and is following his eating plan approximately 90 % of the time. He states he is walking 60 minutes 7 times per week and playing golf 2 times per week. Philip [name] feels that he has done well maintaining his weight at 198 lbs today. He is getting ready to go [place]. He feels that he will do well with activity and increased seafood.  We were unable to weigh the patient today for this TeleHealth visit. He feels as if he has maintained weight since his last visit. He has lost 22 lbs since starting treatment with [place].  Hypothyroid Philip [name] has a diagnosis of hypothyroidism. He is stable on levothyroxine. He denies hot or cold intolerance, tremors, or palpitations.  Depression with emotional eating behaviors Philip [name]'s mood is stable on Wellbutrin and he is doing well avoiding emotional eating and using food for comfort to the extent that it is negatively impacting his health. He has been working on behavior modification techniques to help reduce his emotional eating and has been somewhat successful.   Hypertension Philip [name] is a 78 y.o. male with hypertension. Philip [name]'s blood pressure is elevated today. He is taking lisinopril and has had some borderline elevated blood pressure readings recently. Philip [name] states that he took his lisinopril this morning. He is working on weight loss to help control his  blood pressure with the goal of decreasing his risk of heart attack and stroke.   ASSESSMENT AND PLAN:  Acquired hypothyroidism - Plan: levothyroxine (SYNTHROID) 25 MCG tablet  Other depression - with emotional eating - Plan: buPROPion (WELLBUTRIN SR) 150 MG 12 hr tablet  Essential hypertension  Class 1 obesity with serious comorbidity and body mass index (BMI) of 30.0 to 30.9 in adult, unspecified obesity type  PLAN:  Hypothyroid Philip [name] was informed of the importance of good thyroid control to help with weight loss efforts. He was also informed that supertherapeutic thyroid levels are dangerous and will not improve weight loss results. Philip [name] agrees to continue levothyroxine 25 mcg qAM #30 with no refills and he will follow up in 4 weeks.  Hypertension We discussed sodium restriction, working on healthy weight loss, and a regular exercise program as the means to achieve improved blood pressure control. Philip [name] agreed with this plan and agreed to follow up as directed. We will continue to monitor his blood pressure as well as his progress with the above lifestyle modifications. Philip [name] will continue to check his blood pressure at home. He agrees to continue his lisinopril and diet and he will watch for signs of hypotension as he continues his lifestyle modifications. We will follow up with Philip [name] in 4 weeks as we may need to increase lisinopril if blood pressures stay elevated.  Depression with Emotional Eating Behaviors We discussed behavior modification techniques today to help Philip [name] deal with his emotional eating and depression. He has agreed to take Wellbutrin SR 150 mg qd #  30 with no refills and agreed to follow up as directed.  I spent > than 50% of the 25 minute visit on counseling as documented in the note.  Obesity Philip [name] is currently in the action stage of change. As such, his goal is to continue with weight loss efforts. He has agreed to follow a lower carbohydrate, vegetable,  and lean protein rich diet plan. Philip [name] has been instructed to work up to a goal of 150 minutes of combined cardio and strengthening exercise per week for weight loss and overall health benefits. We discussed the following Behavioral Modification Strategies today: increase H2O intake and travel eating strategies   Philip [name] has agreed to follow up with our clinic in 4 weeks. He was informed of the importance of frequent follow up visits to maximize his success with intensive lifestyle modifications for his multiple health conditions.  ALLERGIES: No Known Allergies  MEDICATIONS: Current Outpatient Medications on File Prior to Visit  Medication Sig Dispense Refill   atorvastatin (LIPITOR) 40 MG tablet TAKE 1 TABLET (40 MG TOTAL) BY MOUTH AT BEDTIME. 90 tablet 2   B Complex-Biotin-FA (B-COMPLEX PO) Take 1 tablet by mouth daily.     Cholecalciferol (VITAMIN D3) 5000 UNITS CAPS Take 5,000 Units by mouth daily.     co-enzyme Q-10 30 MG capsule Take 30 mg by mouth daily.     ELIQUIS 5 MG TABS tablet TAKE 1 TABLET TWICE DAILY 180 tablet 1   hydrocortisone 2.5 % cream Apply 1 application topically as needed.     lisinopril (ZESTRIL) 10 MG tablet TAKE 1 TABLET EVERY DAY 90 tablet 3   magnesium oxide (MAG-OX) 400 MG tablet Take 800 mg by mouth daily.     Multiple Vitamins-Minerals (MULTIVITAMIN WITH MINERALS) tablet Take 1 tablet by mouth daily.     Omega-3 Fatty Acids (OMEGA-3 FISH OIL) 1200 MG CAPS Take 2 1,200 mg capsules daily 90 capsule 3   polyethylene glycol (MIRALAX / GLYCOLAX) packet Take 17 g by mouth daily.     tamsulosin (FLOMAX) 0.4 MG CAPS capsule Take 1 capsule (0.4 mg total) by mouth daily after supper. 30 capsule 6   No current facility-administered medications on file prior to visit.     PAST MEDICAL HISTORY: Past Medical History:  Diagnosis Date   AAA (abdominal aortic aneurysm) (HCC)    Atrial fibrillation (HCC)    CAD (coronary artery disease)    had a MI , s/p  CABG   Cataracts, bilateral    immature   CHF (congestive heart failure) ([place])    Dysrhythmia    paroximal a fib   GERD (gastroesophageal reflux disease)    takes Omeprazole daily   History of colon polyps    History of kidney stones    History of shingles    Hyperlipidemia    Hypertension    takes Amlodipine,Metoprolol,and Lisinopril daily   Joint pain    Joint swelling    Myocardial infarction ([place])    several with last one being 2001(but never knew about any except 2001)   OSA (obstructive sleep apnea)    started CPAP 12/09   Peripheral vascular disease ([place])    Pneumonia    as a child   Skin cancer    several , one of them was melanoma (sees derm routinely)   Tingling    feet occasionally   Tinnitus     PAST SURGICAL HISTORY: Past Surgical History:  Procedure Laterality Date   ABDOMINAL AORTAGRAM N/A 06/16/2014  Procedure: ABDOMINAL AORTAGRAM;  Surgeon: Angelia Mould, MD;  Location[facility] C[facility] LAB;  Service: Cardiovascular;  Laterality: N/A;   ABDOMINAL AORTIC ENDOVASCULAR STENT GRAFT N/A 07/08/2014   Procedure: ABDOMINAL AORTIC ENDOVASCULAR STENT GRAFT/ GORE;  Surgeon: Angelia Mould, MD;  Location: [place];  Service: Vascular;  Laterality: N/A;   CARDIAC CATHETERIZATION  2015   COLONOSCOPY     CORONARY ARTERY BYPASS GRAFT  1999   x 3 vessels   LEFT HEART CATHETERIZATION WITH CORONARY/GRAFT ANGIOGRAM N/A 05/29/2014   Procedure: LEFT HEART CATHETERIZATION WITH [name];  Surgeon: [name], MD;  Location: [facility] CATH LAB;  Service: Cardiovascular;  Laterality: N/A;   PENILE PROSTHESIS IMPLANT  08/2005   RHINOPLASTY  1975    SOCIAL HISTORY: Social History   Tobacco Use   Smoking status: Former Smoker    Types: Cigarettes    Quit date: 1979    Years since quitting: 41.7   Smokeless tobacco: Never Used   Tobacco comment: quit smoking in 1979  Substance Use Topics   Alcohol use: Yes    Comment:  occasionally - once per week   Drug use: No    FAMILY HISTORY: Family History  Problem Relation Age of Onset   Prostate cancer Father 31   Heart disease Father    Skin cancer Father    Heart attack Father    Cancer Father    Deep vein thrombosis Father    Hyperlipidemia Father    Hypertension Father    Heart disease Mother    Skin cancer Mother    Heart attack Mother    Cancer Mother    Hyperlipidemia Mother    Hypertension Mother    Varicose Veins Mother    Peripheral vascular disease Mother        amputation   Sleep apnea Brother    Hyperlipidemia Brother    Hypertension Brother    Prostate cancer Brother 38   Colon cancer Other        aunt   Skin cancer Brother    Skin cancer Sister    Cancer Sister    Heart disease Sister    Diabetes Neg Hx    Stroke Neg Hx     ROS: Review of Systems  Cardiovascular: Negative for palpitations.  Neurological: Negative for tremors.  Endo/Heme/Allergies:       Negative for heat intolerance. Negative for cold intolerance.    PHYSICAL EXAM: Pt in no acute distress  RECENT LABS AND TESTS: BMET    Component Value Date/Time   NA 141 04/26/2019 1341   NA 142 12/10/2018 1353   K 4.4 04/26/2019 1341   CL 104 04/26/2019 1341   CO2 30 04/26/2019 1341   GLUCOSE 81 04/26/2019 1341   BUN 21 04/26/2019 1341   BUN 18 12/10/2018 1353   CREATININE 0.95 04/26/2019 1341   CREATININE 1.08 12/14/2015 1122   CALCIUM 9.3 04/26/2019 1341   GFRNONAA 60 12/10/2018 1353   GFRAA 69 12/10/2018 1353   Lab Results  Component Value Date   HGBA1C 5.8 (H) 12/10/2018   HGBA1C 5.8 (H) 06/06/2018   HGBA1C 5.6 08/14/2017   HGBA1C 5.7 (H) 04/19/2017   HGBA1C 5.9 (H) 01/18/2017   Lab Results  Component Value Date   INSULIN 15.4 12/10/2018   INSULIN 9.6 06/06/2018   INSULIN 10.8 08/14/2017   INSULIN 17.3 04/19/2017   INSULIN 16.1 01/18/2017   CBC    Component Value Date/Time   WBC 5.2 04/26/2019 1341  RBC  4.57 04/26/2019 1341   HGB 14.8 04/26/2019 1341   HGB 14.6 06/06/2018 0000   HCT 43.3 04/26/2019 1341   HCT 43.0 06/06/2018 0000   PLT 156.0 04/26/2019 1341   PLT 147 (L) 06/06/2018 0000   MCV 94.8 04/26/2019 1341   MCV 95 06/06/2018 0000   MCH 32.1 06/06/2018 0000   MCH 31.1 12/14/2015 1122   MCHC 34.2 04/26/2019 1341   RDW 13.0 04/26/2019 1341   RDW 14.2 06/06/2018 0000   LYMPHSABS 2.0 04/26/2019 1341   LYMPHSABS 1.4 06/06/2018 0000   MONOABS 0.6 04/26/2019 1341   EOSABS 0.1 04/26/2019 1341   EOSABS 0.1 06/06/2018 0000   BASOSABS 0.0 04/26/2019 1341   BASOSABS 0.0 06/06/2018 0000   Iron/TIBC/Ferritin/ %Sat No results found for: IRON, TIBC, FERRITIN, IRONPCTSAT Lipid Panel     Component Value Date/Time   CHOL 124 09/12/2018 0837   TRIG 137 09/12/2018 0837   TRIG 256 09/14/2009 0000   HDL 43 09/12/2018 0837   CHOLHDL 2.9 09/12/2018 0837   CHOLHDL 3 12/07/2016 0852   VLDL 30.8 12/07/2016 0852   LDLCALC 54 09/12/2018 0837   LDLDIRECT 68.0 04/13/2015 0823   Hepatic Function Panel     Component Value Date/Time   PROT 6.9 12/10/2018 1353   ALBUMIN 4.9 (H) 12/10/2018 1353   AST 31 12/10/2018 1353   ALT 34 12/10/2018 1353   ALKPHOS 73 12/10/2018 1353   BILITOT 0.6 12/10/2018 1353      Component Value Date/Time   TSH 3.830 05/17/2019 0821   TSH 3.250 12/10/2018 1353   TSH 4.140 08/06/2018 1148   Results for Lyles, MONTGOMERY WILLMANN (MRN [id number]) as of 07/31/2019 10:52  Ref. Range 05/17/2019 08:21  Vitamin D, 25-Hydroxy Latest Ref Range: 30.0 - 100.0 ng/mL 81.0     I, Marcille Blanco, CMA, am acting as transcriptionist for Starlyn Skeans, MD I have reviewed the above documentation for accuracy and completeness, and I agree with the above. -[name], MD

## 2019-08-05 DIAGNOSIS — H3581 Retinal edema: Secondary | ICD-10-CM | POA: Diagnosis not present

## 2019-08-05 DIAGNOSIS — H318 Other specified disorders of choroid: Secondary | ICD-10-CM | POA: Diagnosis not present

## 2019-08-05 DIAGNOSIS — H52222 Regular astigmatism, left eye: Secondary | ICD-10-CM | POA: Diagnosis not present

## 2019-08-13 DIAGNOSIS — Z1159 Encounter for screening for other viral diseases: Secondary | ICD-10-CM | POA: Diagnosis not present

## 2019-08-13 NOTE — Progress Notes (Signed)
Cardiology Office Note    Date:  08/14/2019   ID:  Philip [name], DOB 04-Oct-1941, MRN [id number]  PCP:  Colon Branch, MD  Cardiologist: Larae Grooms, MD EPS: None  Chief Complaint  Patient presents with  . Follow-up    History of Present Illness:  Philip [name] is a 78 y.o. male with history of CAD S/P CABG 1999, AAA endovascular repair 2015, PAF on eliquis CHADSVASC=4, HTN, HLD, CKD sgae 3. Echo 2019 normal LV with grade 1 DD  televisit Dr. Irish Lack 01/2019 doing well.  Patient comes in for f/u. Denies chest pain, shortness of breath, dizziness or presyncope. Did 30 push ups this am, walks 1 hr/day, abdominal exercises. Can't go to the gym anymore.Bruises easily, no melena. Having colonoscopy Friday.  Past Medical History:  Diagnosis Date  . AAA (abdominal aortic aneurysm) ([place])   . Atrial fibrillation ([place])   . CAD (coronary artery disease)    had a MI , s/p CABG  . Cataracts, bilateral    immature  . CHF (congestive heart failure) ([place])   . Dysrhythmia    paroximal a fib  . GERD (gastroesophageal reflux disease)    takes Omeprazole daily  . History of colon polyps   . History of kidney stones   . History of shingles   . Hyperlipidemia   . Hypertension    takes Amlodipine,Metoprolol,and Lisinopril daily  . Joint pain   . Joint swelling   . Myocardial infarction [facility])    several with last one being 2001(but never knew about any except 2001)  . OSA (obstructive sleep apnea)    started CPAP 12/09  . Peripheral vascular disease ([place])   . Pneumonia    as a child  . Skin cancer    several , one of them was melanoma (sees derm routinely)  . Tingling    feet occasionally  . Tinnitus     Past Surgical History:  Procedure Laterality Date  . ABDOMINAL AORTAGRAM N/A 06/16/2014   Procedure: ABDOMINAL Maxcine Ham;  Surgeon: Angelia Mould, MD;  Location: [facility] CATH LAB;  Service: Cardiovascular;  Laterality: N/A;  . ABDOMINAL AORTIC ENDOVASCULAR STENT GRAFT  N/A 07/08/2014   Procedure: ABDOMINAL AORTIC ENDOVASCULAR STENT GRAFT/ GORE;  Surgeon: Angelia Mould, MD;  Location: [place];  Service: Vascular;  Laterality: N/A;  . CARDIAC CATHETERIZATION  2015  . COLONOSCOPY    . CORONARY ARTERY BYPASS GRAFT  1999   x 3 vessels  . LEFT HEART CATHETERIZATION WITH CORONARY/GRAFT ANGIOGRAM N/A 05/29/2014   Procedure: LEFT HEART CATHETERIZATION WITH [name];  Surgeon: [name], MD;  Location: [facility] CATH LAB;  Service: Cardiovascular;  Laterality: N/A;  . PENILE PROSTHESIS IMPLANT  08/2005  . RHINOPLASTY  1975    Current Medications: Current Meds  Medication Sig  . atorvastatin (LIPITOR) 40 MG tablet TAKE 1 TABLET (40 MG TOTAL) BY MOUTH AT BEDTIME.  . B Complex-Biotin-FA (B-COMPLEX PO) Take 1 tablet by mouth daily.  Marland Kitchen buPROPion (WELLBUTRIN SR) 150 MG 12 hr tablet Take 1 tablet (150 mg total) by mouth daily.  . Cholecalciferol (VITAMIN D3) 5000 UNITS CAPS Take 5,000 Units by mouth daily.  Marland Kitchen co-enzyme Q-10 30 MG capsule Take 30 mg by mouth daily.  Marland Kitchen ELIQUIS 5 MG TABS tablet TAKE 1 TABLET TWICE DAILY  . hydrocortisone 2.5 % cream Apply 1 application topically as needed.  Marland Kitchen levothyroxine (SYNTHROID) 25 MCG tablet Take 1 tablet (25 mcg total) by mouth daily before breakfast.  .  lisinopril (ZESTRIL) 10 MG tablet TAKE 1 TABLET EVERY DAY  . magnesium oxide (MAG-OX) 400 MG tablet Take 800 mg by mouth daily.  . Multiple Vitamins-Minerals (MULTIVITAMIN WITH MINERALS) tablet Take 1 tablet by mouth daily.  . Omega-3 Fatty Acids (OMEGA-3 FISH OIL) 1200 MG CAPS Take 2 1,200 mg capsules daily  . polyethylene glycol (MIRALAX / GLYCOLAX) packet Take 17 g by mouth daily.     Allergies:   Patient has no known allergies.   Social History   Socioeconomic History  . Marital status: Married    Spouse name: Not on file  . Number of children: 3  . Years of education: 15  . Highest education level: Not on file  Occupational History  . Occupation:  retired, Actor  Social Needs  . Financial resource strain: Not on file  . Food insecurity    Worry: Not on file    Inability: Not on file  . Transportation needs    Medical: Not on file    Non-medical: Not on file  Tobacco Use  . Smoking status: Former Smoker    Types: Cigarettes    Quit date: 1979    Years since quitting: 41.8  . Smokeless tobacco: Never Used  . Tobacco comment: quit smoking in 1979  Substance and Sexual Activity  . Alcohol use: Yes    Comment: occasionally - once per week  . Drug use: No  . Sexual activity: Not Currently  Lifestyle  . Physical activity    Days per week: Not on file    Minutes per session: Not on file  . Stress: Not on file  Relationships  . Social Herbalist on phone: Not on file    Gets together: Not on file    Attends religious service: Not on file    Active member of club or organization: Not on file    Attends meetings of clubs or organizations: Not on file    Relationship status: Not on file  Other Topics Concern  . Not on file  Social History Narrative   Lives w/ wife in a 2 story home.  Has one son and 2 stepchildren.  Retired for [employer].     Education: high school.     Family History:  The patient's family history includes Cancer in his father, mother, and sister; Colon cancer in an other family member; Deep vein thrombosis in his father; Heart attack in his father and mother; Heart disease in his father, mother, and sister; Hyperlipidemia in his brother, father, and mother; Hypertension in his brother, father, and mother; Peripheral vascular disease in his mother; Prostate cancer (age of onset: 3) in his father; Prostate cancer (age of onset: 25) in his brother; Skin cancer in his brother, father, mother, and sister; Sleep apnea in his brother; Varicose Veins in his mother.   ROS:   Please see the history of present illness.    ROS All other systems reviewed and are negative.   PHYSICAL EXAM:   VS:  BP  140/88   Pulse 73   Ht _0  (1.753 m)   Wt 202 lb 12.8 oz (92 kg)   SpO2 96%   BMI 29.95 kg/m   Physical Exam  GEN: Well nourished, well developed, in no acute distress  Neck: no JVD, carotid bruits, or masses Cardiac:RRR; no murmurs, rubs, or gallops  Respiratory:  clear to auscultation bilaterally, normal work of breathing GI: soft, nontender, nondistended, + BS Ext: trace ankle  edema, varicose veins.without cyanosis, clubbing, Good distal pulses bilaterally Neuro:  Alert and Oriented x 3 Psych: euthymic mood, full affect  Wt Readings from Last 3 Encounters:  08/14/19 202 lb 12.8 oz (92 kg)  04/26/19 208 lb 8 oz (94.6 kg)  02/04/19 201 lb (91.2 kg)      Studies/Labs Reviewed:   EKG:  EKG is  ordered today.   NSR with PVC's nonspecific intraventricular conduction delay Recent Labs: 12/10/2018: ALT 34 04/26/2019: BUN 21; Creatinine, Ser 0.95; Hemoglobin 14.8; Platelets 156.0; Potassium 4.4; Sodium 141 05/17/2019: TSH 3.830   Lipid Panel    Component Value Date/Time   CHOL 124 09/12/2018 0837   TRIG 137 09/12/2018 0837   TRIG 256 09/14/2009 0000   HDL 43 09/12/2018 0837   CHOLHDL 2.9 09/12/2018 0837   CHOLHDL 3 12/07/2016 0852   VLDL 30.8 12/07/2016 0852   LDLCALC 54 09/12/2018 0837   LDLDIRECT 68.0 04/13/2015 0823    Additional studies/ records that were reviewed today include:  Echo 3/2019tudy Conclusions   - Left ventricle: The cavity size was mildly dilated. Wall   thickness was increased in a pattern of mild LVH. Systolic   function was normal. The estimated ejection fraction was in the   range of 55% to 60%. Wall motion was normal; there were no   regional wall motion abnormalities. Doppler parameters are   consistent with abnormal left ventricular relaxation (grade 1   diastolic dysfunction). Doppler parameters are consistent with   high ventricular filling pressure. - Mitral valve: Severely calcified annulus. - Left atrium: The atrium was moderately  dilated.   Impressions:   - Normal LV systolic function; mild LVH: mild LVE; mild diastolic   dysfunction; severe MAC with trace MR; moderate LAE.   ------------------------------------------------------------------- Labs, prior tests, procedures, and surgery: Transthoracic echocardiography (05/16/2014).     EF was 55%.  NST 12/2016   ASSESSMENT:    1. Coronary artery disease involving native coronary artery of native heart without angina pectoris   2. Paroxysmal atrial fibrillation (HCC)   3. Essential hypertension   4. Mixed hyperlipidemia   5. PAD (peripheral artery disease) (HCC)      PLAN:  In order of problems listed above:   1.  CAD:S/P CABG 1999 No angina.  COntinue aggressive secondary prevention.  Exercises daily. Has lost 40 lbs at weight loss center.   2.  PAF: Eliquis for stroke prevention.  No significant bleeding issues noted. Needs bmet cbc  For Eliquis labs q 6 months. Holding eliquis for 2 days for colonoscopy on Friday   3. HTN: BP well controlled   4. Hyperlipiemia: . LDL 54 08/2018-thinks he had it checked at weight loss center   5.  PAD: AAA repaired.  CT 05/15/19 aneurysm stable 6.7 cm per Dr. Scot [name]     Medication Adjustments/Labs and Tests Ordered: Current medicines are reviewed at length with the patient today.  Concerns regarding medicines are outlined above.  Medication changes, Labs and Tests ordered today are listed in the Patient Instructions below. Patient Instructions  Medication Instructions:  Your physician recommends that you continue on your current medications as directed. Please refer to the Current Medication list given to you today.  If you need a refill on your cardiac medications before your next appointment, please call your pharmacy.   Lab work: TODAY: CBC, BMET  If you have labs (blood work) drawn today and your tests are completely normal, you will receive your results only by: Marland Kitchen MyChart Message (  if you have MyChart) OR  . A paper copy in the mail If you have any lab test that is abnormal or we need to change your treatment, we will call you to review the results.  Testing/Procedures: None ordered  Follow-Up: At [facility], you and your health needs are our priority.  As part of our continuing mission to provide you with exceptional heart care, we have created designated Provider Care Teams.  These Care Teams include your primary Cardiologist (physician) and Advanced Practice Providers (APPs -  Physician Assistants and Nurse Practitioners) who all work together to provide you with the care you need, when you need it. . You will need a follow up appointment in 6 months.  Please call our office 2 months in advance to schedule this appointment.  You may see Casandra Doffing, MD or one of the following Advanced Practice Providers on your designated Care Team:   . Lyda Jester, PA-C . Dayna Dunn, PA-C . Ermalinda Barrios, PA-C  Any Other Special Instructions Will Be Listed Below (If Applicable).       Sumner Boast, PA-C  08/14/2019 10:28 AM    [facility] [address], [place], [place]  [address] Phone: [phone]; Fax: [phone]

## 2019-08-14 ENCOUNTER — Other Ambulatory Visit: Payer: Self-pay

## 2019-08-14 ENCOUNTER — Encounter: Payer: Self-pay | Admitting: Physician Assistant

## 2019-08-14 ENCOUNTER — Ambulatory Visit: Payer: Medicare HMO | Admitting: Physician Assistant

## 2019-08-14 VITALS — BP 140/88 | HR 73 | Ht 69.0 in | Wt 202.8 lb

## 2019-08-14 DIAGNOSIS — I480 Paroxysmal atrial fibrillation: Secondary | ICD-10-CM

## 2019-08-14 DIAGNOSIS — I10 Essential (primary) hypertension: Secondary | ICD-10-CM

## 2019-08-14 DIAGNOSIS — E782 Mixed hyperlipidemia: Secondary | ICD-10-CM | POA: Diagnosis not present

## 2019-08-14 DIAGNOSIS — I739 Peripheral vascular disease, unspecified: Secondary | ICD-10-CM

## 2019-08-14 DIAGNOSIS — G4733 Obstructive sleep apnea (adult) (pediatric): Secondary | ICD-10-CM | POA: Diagnosis not present

## 2019-08-14 DIAGNOSIS — I2510 Atherosclerotic heart disease of native coronary artery without angina pectoris: Secondary | ICD-10-CM | POA: Diagnosis not present

## 2019-08-14 LAB — BASIC METABOLIC PANEL
BUN/Creatinine Ratio: 24 (ref 10–24)
BUN: 22 mg/dL (ref 8–27)
CO2: 24 mmol/L (ref 20–29)
Calcium: 9.9 mg/dL (ref 8.6–10.2)
Chloride: 99 mmol/L (ref 96–106)
Creatinine, Ser: 0.92 mg/dL (ref 0.76–1.27)
GFR calc Af Amer: 91 mL/min/{1.73_m2} (ref >59)
GFR calc non Af Amer: 79 mL/min/{1.73_m2} (ref >59)
Glucose: 102 mg/dL — ABNORMAL HIGH (ref 65–99)
Potassium: 4.4 mmol/L (ref 3.5–5.2)
Sodium: 137 mmol/L (ref 134–144)

## 2019-08-14 LAB — CBC
Hematocrit: 42.5 % (ref 37.5–51.0)
Hemoglobin: 14.8 g/dL (ref 13.0–17.7)
MCH: 32.7 pg (ref 26.6–33.0)
MCHC: 34.8 g/dL (ref 31.5–35.7)
MCV: 94 fL (ref 79–97)
Platelets: 158 10*3/uL (ref 150–450)
RBC: 4.53 x10E6/uL (ref 4.14–5.80)
RDW: 12.7 % (ref 11.6–15.4)
WBC: 3.6 10*3/uL (ref 3.4–10.8)

## 2019-08-14 NOTE — Patient Instructions (Signed)
Medication Instructions:  Your physician recommends that you continue on your current medications as directed. Please refer to the Current Medication list given to you today.  If you need a refill on your cardiac medications before your next appointment, please call your pharmacy.   Lab work: TODAY: CBC, BMET  If you have labs (blood work) drawn today and your tests are completely normal, you will receive your results only by: Marland Kitchen MyChart Message (if you have MyChart) OR . A paper copy in the mail If you have any lab test that is abnormal or we need to change your treatment, we will call you to review the results.  Testing/Procedures: None ordered  Follow-Up: At [facility], you and your health needs are our priority.  As part of our continuing mission to provide you with exceptional heart care, we have created designated Provider Care Teams.  These Care Teams include your primary Cardiologist (physician) and Advanced Practice Providers (APPs -  Physician Assistants and Nurse Practitioners) who all work together to provide you with the care you need, when you need it. . You will need a follow up appointment in 6 months.  Please call our office 2 months in advance to schedule this appointment.  You may see Casandra Doffing, MD or one of the following Advanced Practice Providers on your designated Care Team:   . Lyda Jester, PA-C . Dayna Dunn, PA-C . Ermalinda Barrios, PA-C  Any Other Special Instructions Will Be Listed Below (If Applicable).

## 2019-08-16 DIAGNOSIS — K635 Polyp of colon: Secondary | ICD-10-CM | POA: Diagnosis not present

## 2019-08-16 DIAGNOSIS — Z86010 Personal history of colonic polyps: Secondary | ICD-10-CM | POA: Diagnosis not present

## 2019-08-16 DIAGNOSIS — D125 Benign neoplasm of sigmoid colon: Secondary | ICD-10-CM | POA: Diagnosis not present

## 2019-08-16 DIAGNOSIS — K5520 Angiodysplasia of colon without hemorrhage: Secondary | ICD-10-CM | POA: Diagnosis not present

## 2019-08-16 DIAGNOSIS — K640 First degree hemorrhoids: Secondary | ICD-10-CM | POA: Diagnosis not present

## 2019-08-16 DIAGNOSIS — D120 Benign neoplasm of cecum: Secondary | ICD-10-CM | POA: Diagnosis not present

## 2019-08-16 DIAGNOSIS — K6289 Other specified diseases of anus and rectum: Secondary | ICD-10-CM | POA: Diagnosis not present

## 2019-08-16 DIAGNOSIS — D128 Benign neoplasm of rectum: Secondary | ICD-10-CM | POA: Diagnosis not present

## 2019-08-16 LAB — HM COLONOSCOPY

## 2019-08-20 DIAGNOSIS — K635 Polyp of colon: Secondary | ICD-10-CM | POA: Diagnosis not present

## 2019-08-20 DIAGNOSIS — D120 Benign neoplasm of cecum: Secondary | ICD-10-CM | POA: Diagnosis not present

## 2019-08-20 DIAGNOSIS — D125 Benign neoplasm of sigmoid colon: Secondary | ICD-10-CM | POA: Diagnosis not present

## 2019-08-20 DIAGNOSIS — D128 Benign neoplasm of rectum: Secondary | ICD-10-CM | POA: Diagnosis not present

## 2019-08-26 DIAGNOSIS — L820 Inflamed seborrheic keratosis: Secondary | ICD-10-CM | POA: Diagnosis not present

## 2019-08-26 DIAGNOSIS — L821 Other seborrheic keratosis: Secondary | ICD-10-CM | POA: Diagnosis not present

## 2019-08-26 DIAGNOSIS — D225 Melanocytic nevi of trunk: Secondary | ICD-10-CM | POA: Diagnosis not present

## 2019-08-26 DIAGNOSIS — D1801 Hemangioma of skin and subcutaneous tissue: Secondary | ICD-10-CM | POA: Diagnosis not present

## 2019-08-26 DIAGNOSIS — L570 Actinic keratosis: Secondary | ICD-10-CM | POA: Diagnosis not present

## 2019-08-26 DIAGNOSIS — D485 Neoplasm of uncertain behavior of skin: Secondary | ICD-10-CM | POA: Diagnosis not present

## 2019-08-26 DIAGNOSIS — Z85828 Personal history of other malignant neoplasm of skin: Secondary | ICD-10-CM | POA: Diagnosis not present

## 2019-08-28 ENCOUNTER — Encounter (INDEPENDENT_AMBULATORY_CARE_PROVIDER_SITE_OTHER): Payer: Self-pay | Admitting: Family Medicine

## 2019-08-28 ENCOUNTER — Other Ambulatory Visit: Payer: Self-pay

## 2019-08-28 ENCOUNTER — Telehealth (INDEPENDENT_AMBULATORY_CARE_PROVIDER_SITE_OTHER): Payer: Medicare HMO | Admitting: Family Medicine

## 2019-08-28 DIAGNOSIS — F3289 Other specified depressive episodes: Secondary | ICD-10-CM | POA: Diagnosis not present

## 2019-08-28 DIAGNOSIS — I10 Essential (primary) hypertension: Secondary | ICD-10-CM

## 2019-08-28 DIAGNOSIS — Z6831 Body mass index (BMI) 31.0-31.9, adult: Secondary | ICD-10-CM

## 2019-08-28 DIAGNOSIS — E669 Obesity, unspecified: Secondary | ICD-10-CM | POA: Diagnosis not present

## 2019-08-29 NOTE — Progress Notes (Signed)
Office: [phone]  /  Fax: [phone] TeleHealth Visit:  Philip [name] has verbally consented to this TeleHealth visit today. The patient is located at home, the provider is located at the [employer] [facility] office. The participants in this visit include the listed provider and patient. Philip [name] was unable to use realtime audiovisual technology today and the telehealth visit was conducted via telephone.   HPI:   Chief Complaint: OBESITY Philip [name] is here to discuss his progress with his obesity treatment plan. He is on the lower carbohydrate, vegetable and lean protein rich diet plan and is following his eating plan approximately 75 % of the time. He states he is walking daily, playing golf, and doing push ups for 60 minutes 6 times per week. Philip [name] continues to do well maintaining his weight loss. He states his weight today is 197 lbs. He saw his Cardiologist recently who was happy with his diet, exercise, and weight loss.  We were unable to weigh the patient today for this TeleHealth visit. He feels as if he has lost 1 lb since his last visit. He has lost 22-23 lbs since starting treatment with [place].  Hypertension Philip [name] is a 78 y.o. male with hypertension. Christine's blood pressure recently at his Cardiologist was 140/88, and is considered by them to be well controlled especially for his age. No change in medications. He is working weight loss to help control his blood pressure with the goal of decreasing his risk of heart attack and stroke.   Depression with Emotional Eating Behaviors Philip [name]'s mood is stable and he feels he is doing well, and would like to try to discontinue Wellbutrin. Philip [name] struggles with emotional eating and using food for comfort to the extent that it is negatively impacting his health. He often snacks when he is not hungry. Philip [name] sometimes feels he is out of control and then feels guilty that he made poor food choices. He has been working on behavior  modification techniques to help reduce his emotional eating and has been somewhat successful. He shows no sign of suicidal or homicidal ideations.  Depression screen [facility] 2/9 09/03/2018 01/18/2017 12/06/2016 12/21/2015 06/19/2015  Decreased Interest 0 3 1 0 0  Down, Depressed, Hopeless 0 1 0 0 0  PHQ - 2 Score 0 4 1 0 0  Altered sleeping - 2 - - -  Tired, decreased energy - 2 - - -  Change in appetite - 1 - - -  Feeling bad or failure about yourself  - 2 - - -  Trouble concentrating - 1 - - -  Moving slowly or fidgety/restless - 1 - - -  Suicidal thoughts - 0 - - -  PHQ-9 Score - 13 - - -    ASSESSMENT AND PLAN:  Essential hypertension  Other depression  Class 1 obesity with serious comorbidity and body mass index (BMI) of 31.0 to 31.9 in adult, unspecified obesity type  PLAN:  Hypertension We discussed sodium restriction, working on healthy weight loss, and a regular exercise program as the means to achieve improved blood pressure control. Philip [name] agreed with this plan and agreed to follow up as directed. We will continue to monitor his blood pressure as well as his progress with the above lifestyle modifications. Philip [name] agrees to continue taking lisinopril as is, and will continue diet and exercise. He will watch for signs of hypotension as he continues his lifestyle modifications. Philip [name] agrees to follow up with our clinic in 2 weeks.  Depression with Emotional Eating Behaviors We discussed behavior modification techniques today to help Philip [name] deal with his emotional eating and depression. Philip [name] is to take Wellbutrin every other day for 10 days and then discontinue. We will follow up in 2 weeks to reassess.  I spent > than 50% of the 25 minute visit on counseling as documented in the note.  Obesity Philip [name] is currently in the action stage of change. As such, his goal is to continue with weight loss efforts He has agreed to follow a lower carbohydrate, vegetable and lean protein rich  diet plan Jaspreet has been instructed to work up to a goal of 150 minutes of combined cardio and strengthening exercise per week or as is for weight loss and overall health benefits. We discussed the following Behavioral Modification Strategies today: increasing lean protein intake and no skipping meals   Zaelyn has agreed to follow up with our clinic in 2 weeks. He was informed of the importance of frequent follow up visits to maximize his success with intensive lifestyle modifications for his multiple health conditions.  ALLERGIES: No Known Allergies  MEDICATIONS: Current Outpatient Medications on File Prior to Visit  Medication Sig Dispense Refill   atorvastatin (LIPITOR) 40 MG tablet TAKE 1 TABLET (40 MG TOTAL) BY MOUTH AT BEDTIME. 90 tablet 2   B Complex-Biotin-FA (B-COMPLEX PO) Take 1 tablet by mouth daily.     buPROPion (WELLBUTRIN SR) 150 MG 12 hr tablet Take 1 tablet (150 mg total) by mouth daily. 30 tablet 0   Cholecalciferol (VITAMIN D3) 5000 UNITS CAPS Take 5,000 Units by mouth daily.     co-enzyme Q-10 30 MG capsule Take 30 mg by mouth daily.     ELIQUIS 5 MG TABS tablet TAKE 1 TABLET TWICE DAILY 180 tablet 1   hydrocortisone 2.5 % cream Apply 1 application topically as needed.     levothyroxine (SYNTHROID) 25 MCG tablet Take 1 tablet (25 mcg total) by mouth daily before breakfast. 30 tablet 0   lisinopril (ZESTRIL) 10 MG tablet TAKE 1 TABLET EVERY DAY 90 tablet 3   magnesium oxide (MAG-OX) 400 MG tablet Take 800 mg by mouth daily.     Multiple Vitamins-Minerals (MULTIVITAMIN WITH MINERALS) tablet Take 1 tablet by mouth daily.     Omega-3 Fatty Acids (OMEGA-3 FISH OIL) 1200 MG CAPS Take 2 1,200 mg capsules daily 90 capsule 3   polyethylene glycol (MIRALAX / GLYCOLAX) packet Take 17 g by mouth daily.     No current facility-administered medications on file prior to visit.     PAST MEDICAL HISTORY: Past Medical History:  Diagnosis Date   AAA (abdominal aortic  aneurysm) (HCC)    Atrial fibrillation (HCC)    CAD (coronary artery disease)    had a MI , s/p CABG   Cataracts, bilateral    immature   CHF (congestive heart failure) ([place])    Dysrhythmia    paroximal a fib   GERD (gastroesophageal reflux disease)    takes Omeprazole daily   History of colon polyps    History of kidney stones    History of shingles    Hyperlipidemia    Hypertension    takes Amlodipine,Metoprolol,and Lisinopril daily   Joint pain    Joint swelling    Myocardial infarction ([place])    several with last one being 2001(but never knew about any except 2001)   OSA (obstructive sleep apnea)    started CPAP 12/09   Peripheral vascular disease ([place])  Pneumonia    as a child   Skin cancer    several , one of them was melanoma (sees derm routinely)   Tingling    feet occasionally   Tinnitus     PAST SURGICAL HISTORY: Past Surgical History:  Procedure Laterality Date   ABDOMINAL AORTAGRAM N/A 06/16/2014   Procedure: ABDOMINAL Maxcine Ham;  Surgeon: Angelia Mould, MD;  Location: [facility] CATH LAB;  Service: Cardiovascular;  Laterality: N/A;   ABDOMINAL AORTIC ENDOVASCULAR STENT GRAFT N/A 07/08/2014   Procedure: ABDOMINAL AORTIC ENDOVASCULAR STENT GRAFT/ GORE;  Surgeon: Angelia Mould, MD;  Location: [place];  Service: Vascular;  Laterality: N/A;   CARDIAC CATHETERIZATION  2015   COLONOSCOPY     CORONARY ARTERY BYPASS GRAFT  1999   x 3 vessels   LEFT HEART CATHETERIZATION WITH CORONARY/GRAFT ANGIOGRAM N/A 05/29/2014   Procedure: LEFT HEART CATHETERIZATION WITH [name];  Surgeon: [name], MD;  Location[facility] CATH LAB;  Service: Cardiovascular;  Laterality: N/A;   PENILE PROSTHESIS IMPLANT  08/2005   RHINOPLASTY  1975    SOCIAL HISTORY: Social History   Tobacco Use   Smoking status: Former Smoker    Types: Cigarettes    Quit date: 1979    Years since quitting: 41.8   Smokeless tobacco: Never Used    Tobacco comment: quit smoking in 1979  Substance Use Topics   Alcohol use: Yes    Comment: occasionally - once per week   Drug use: No    FAMILY HISTORY: Family History  Problem Relation Age of Onset   Prostate cancer Father 64   Heart disease Father    Skin cancer Father    Heart attack Father    Cancer Father    Deep vein thrombosis Father    Hyperlipidemia Father    Hypertension Father    Heart disease Mother    Skin cancer Mother    Heart attack Mother    Cancer Mother    Hyperlipidemia Mother    Hypertension Mother    Varicose Veins Mother    Peripheral vascular disease Mother        amputation   Sleep apnea Brother    Hyperlipidemia Brother    Hypertension Brother    Prostate cancer Brother 56   Colon cancer Other        aunt   Skin cancer Brother    Skin cancer Sister    Cancer Sister    Heart disease Sister    Diabetes Neg Hx    Stroke Neg Hx     ROS: Review of Systems  Constitutional: Positive for weight loss.  Cardiovascular: Negative for chest pain.  Psychiatric/Behavioral: Positive for depression. Negative for suicidal ideas.    PHYSICAL EXAM: Pt in no acute distress  RECENT LABS AND TESTS: BMET    Component Value Date/Time   NA 137 08/14/2019 1030   K 4.4 08/14/2019 1030   CL 99 08/14/2019 1030   CO2 24 08/14/2019 1030   GLUCOSE 102 (H) 08/14/2019 1030   GLUCOSE 81 04/26/2019 1341   BUN 22 08/14/2019 1030   CREATININE 0.92 08/14/2019 1030   CREATININE 1.08 12/14/2015 1122   CALCIUM 9.9 08/14/2019 1030   GFRNONAA 79 08/14/2019 1030   GFRAA 91 08/14/2019 1030   Lab Results  Component Value Date   HGBA1C 5.8 (H) 12/10/2018   HGBA1C 5.8 (H) 06/06/2018   HGBA1C 5.6 08/14/2017   HGBA1C 5.7 (H) 04/19/2017   HGBA1C 5.9 (H) 01/18/2017  Lab Results  Component Value Date   INSULIN 15.4 12/10/2018   INSULIN 9.6 06/06/2018   INSULIN 10.8 08/14/2017   INSULIN 17.3 04/19/2017   INSULIN 16.1 01/18/2017    CBC    Component Value Date/Time   WBC 3.6 08/14/2019 1030   WBC 5.2 04/26/2019 1341   RBC 4.53 08/14/2019 1030   RBC 4.57 04/26/2019 1341   HGB 14.8 08/14/2019 1030   HCT 42.5 08/14/2019 1030   PLT 158 08/14/2019 1030   MCV 94 08/14/2019 1030   MCH 32.7 08/14/2019 1030   MCH 31.1 12/14/2015 1122   MCHC 34.8 08/14/2019 1030   MCHC 34.2 04/26/2019 1341   RDW 12.7 08/14/2019 1030   LYMPHSABS 2.0 04/26/2019 1341   LYMPHSABS 1.4 06/06/2018 0000   MONOABS 0.6 04/26/2019 1341   EOSABS 0.1 04/26/2019 1341   EOSABS 0.1 06/06/2018 0000   BASOSABS 0.0 04/26/2019 1341   BASOSABS 0.0 06/06/2018 0000   Iron/TIBC/Ferritin/ %Sat No results found for: IRON, TIBC, FERRITIN, IRONPCTSAT Lipid Panel     Component Value Date/Time   CHOL 124 09/12/2018 0837   TRIG 137 09/12/2018 0837   TRIG 256 09/14/2009 0000   HDL 43 09/12/2018 0837   CHOLHDL 2.9 09/12/2018 0837   CHOLHDL 3 12/07/2016 0852   VLDL 30.8 12/07/2016 0852   LDLCALC 54 09/12/2018 0837   LDLDIRECT 68.0 04/13/2015 0823   Hepatic Function Panel     Component Value Date/Time   PROT 6.9 12/10/2018 1353   ALBUMIN 4.9 (H) 12/10/2018 1353   AST 31 12/10/2018 1353   ALT 34 12/10/2018 1353   ALKPHOS 73 12/10/2018 1353   BILITOT 0.6 12/10/2018 1353      Component Value Date/Time   TSH 3.830 05/17/2019 0821   TSH 3.250 12/10/2018 1353   TSH 4.140 08/06/2018 1148      I, Trixie Dredge, am acting as Location manager for [name], MD I have reviewed the above documentation for accuracy and completeness, and I agree with the above. -[name], MD

## 2019-09-09 ENCOUNTER — Other Ambulatory Visit: Payer: Self-pay | Admitting: Internal Medicine

## 2019-09-09 ENCOUNTER — Encounter: Payer: Self-pay | Admitting: Internal Medicine

## 2019-09-09 DIAGNOSIS — I2510 Atherosclerotic heart disease of native coronary artery without angina pectoris: Secondary | ICD-10-CM

## 2019-09-09 NOTE — Telephone Encounter (Signed)
Refill Request.  

## 2019-09-09 NOTE — Telephone Encounter (Signed)
Pt's age 78, wt 45 kg, SCr 0.92, CrCl 84.72, last ov w/ Ermalinda Barrios, PA 08/14/19.

## 2019-09-16 ENCOUNTER — Other Ambulatory Visit: Payer: Self-pay

## 2019-09-16 ENCOUNTER — Encounter (INDEPENDENT_AMBULATORY_CARE_PROVIDER_SITE_OTHER): Payer: Self-pay | Admitting: Family Medicine

## 2019-09-16 ENCOUNTER — Ambulatory Visit (INDEPENDENT_AMBULATORY_CARE_PROVIDER_SITE_OTHER): Payer: Medicare HMO | Admitting: Family Medicine

## 2019-09-16 VITALS — BP 155/83 | HR 70 | Temp 97.8°F | Ht 69.0 in | Wt 200.0 lb

## 2019-09-16 DIAGNOSIS — E669 Obesity, unspecified: Secondary | ICD-10-CM | POA: Diagnosis not present

## 2019-09-16 DIAGNOSIS — E039 Hypothyroidism, unspecified: Secondary | ICD-10-CM

## 2019-09-16 DIAGNOSIS — Z6830 Body mass index (BMI) 30.0-30.9, adult: Secondary | ICD-10-CM | POA: Diagnosis not present

## 2019-09-16 DIAGNOSIS — I10 Essential (primary) hypertension: Secondary | ICD-10-CM | POA: Diagnosis not present

## 2019-09-16 MED ORDER — LEVOTHYROXINE SODIUM 25 MCG PO TABS: 25.0000 ug | ORAL_TABLET | Freq: Every day | ORAL | 0 refills | Status: DC

## 2019-09-18 NOTE — Progress Notes (Signed)
Office: [phone]  /  Fax: [phone]   HPI:   Chief Complaint: OBESITY Philip [name] is here to discuss his progress with his obesity treatment plan. He is on the lower carbohydrate, vegetable and lean protein rich diet plan and is following his eating plan approximately 50 % of the time. He states he is walking daily and doing yard work for 60 minutes 7 times per week. Philip [name] continues to do well with weight loss, and his BMI is now below 30. He is happy with his low carbohydrate plan, and his hunger is controlled. He is active with golf and yard work in addition to daily walking.  His weight is 200 lb (90.7 kg) today and has had a weight loss of 8 pounds over a period of 4-5 months since his last visit. He has lost 30 lbs since starting treatment with [place].  Hypertension Philip [name] is a 78 y.o. male with hypertension. [name]'s blood pressure has increased. He states he checks his blood pressure at home every week or so, but didn't bring in his blood pressure log. He denies chest pain, palpitations, or shortness of breath at rest. He is working on weight loss to help control his blood pressure with the goal of decreasing his risk of heart attack and stroke.   Hypothyroidism Philip [name] has a diagnosis of hypothyroidism. He is stable on his medications. He denies hot or cold intolerance or palpitations.  ASSESSMENT AND PLAN:  Essential hypertension  Acquired hypothyroidism - Plan: levothyroxine (SYNTHROID) 25 MCG tablet  Class 1 obesity with serious comorbidity and body mass index (BMI) of 30.0 to 30.9 in adult, unspecified obesity type - Starting BMI greater then 30  PLAN:  Hypertension We discussed sodium restriction, working on healthy weight loss, and a regular exercise program as the means to achieve improved blood pressure control. Graham agreed with this plan and agreed to follow up as directed. We will continue to monitor his blood pressure as well as his progress with the above  lifestyle modifications. Rhyen agrees to continue his medications and will watch for signs of hypotension as he continues his lifestyle modifications. He was asked to check his blood pressure 2-3 times per week and write down results, and bring them into his follow up visit next month.  Hypothyroidism Philip [name] was informed of the importance of good thyroid control to help with weight loss efforts. He was also informed that supertheraputic thyroid levels are dangerous and will not improve weight loss results. [name] agrees to continue taking levothyroxine 25 mcg PO q AM #30 and we will refill for 1 month. We will recheck labs in 1 month. Philip [name] agrees to follow up with our clinic in 4-5 weeks.  Obesity Philip [name] is currently in the action stage of change. As such, his goal is to continue with weight loss efforts He has agreed to follow a lower carbohydrate, vegetable and lean protein rich diet plan Philip [name] has been instructed to work up to a goal of 150 minutes of combined cardio and strengthening exercise per week or as is for weight loss and overall health benefits. We discussed the following Behavioral Modification Strategies today: increasing vegetables and holiday eating strategies    Philip [name] has agreed to follow up with our clinic in 4 to 5 weeks. He was informed of the importance of frequent follow up visits to maximize his success with intensive lifestyle modifications for his multiple health conditions.  ALLERGIES: No Known Allergies  MEDICATIONS: Current Outpatient Medications on File Prior  to Visit  Medication Sig Dispense Refill  . atorvastatin (LIPITOR) 40 MG tablet TAKE 1 TABLET (40 MG TOTAL) BY MOUTH AT BEDTIME. 90 tablet 2  . B Complex-Biotin-FA (B-COMPLEX PO) Take 1 tablet by mouth daily.    . Cholecalciferol (VITAMIN D3) 5000 UNITS CAPS Take 5,000 Units by mouth daily.    Marland Kitchen co-enzyme Q-10 30 MG capsule Take 30 mg by mouth daily.    Marland Kitchen ELIQUIS 5 MG TABS tablet TAKE 1 TABLET TWICE  DAILY 180 tablet 1  . hydrocortisone 2.5 % cream Apply 1 application topically as needed.    Marland Kitchen lisinopril (ZESTRIL) 10 MG tablet TAKE 1 TABLET EVERY DAY 90 tablet 3  . magnesium oxide (MAG-OX) 400 MG tablet Take 800 mg by mouth daily.    . Multiple Vitamins-Minerals (MULTIVITAMIN WITH MINERALS) tablet Take 1 tablet by mouth daily.    . Omega-3 Fatty Acids (OMEGA-3 FISH OIL) 1200 MG CAPS Take 2 1,200 mg capsules daily 90 capsule 3  . polyethylene glycol (MIRALAX / GLYCOLAX) packet Take 17 g by mouth daily.     No current facility-administered medications on file prior to visit.     PAST MEDICAL HISTORY: Past Medical History:  Diagnosis Date  . AAA (abdominal aortic aneurysm) ([place])   . Atrial fibrillation ([place])   . CAD (coronary artery disease)    had a MI , s/p CABG  . Cataracts, bilateral    immature  . CHF (congestive heart failure) ([place])   . Dysrhythmia    paroximal a fib  . GERD (gastroesophageal reflux disease)    takes Omeprazole daily  . History of colon polyps   . History of kidney stones   . History of shingles   . Hyperlipidemia   . Hypertension    takes Amlodipine,Metoprolol,and Lisinopril daily  . Joint pain   . Joint swelling   . Myocardial infarction [facility])    several with last one being 2001(but never knew about any except 2001)  . OSA (obstructive sleep apnea)    started CPAP 12/09  . Peripheral vascular disease ([place])   . Pneumonia    as a child  . Skin cancer    several , one of them was melanoma (sees derm routinely)  . Tingling    feet occasionally  . Tinnitus     PAST SURGICAL HISTORY: Past Surgical History:  Procedure Laterality Date  . ABDOMINAL AORTAGRAM N/A 06/16/2014   Procedure: ABDOMINAL Maxcine Ham;  Surgeon: Angelia Mould, MD;  Location[facility] C[facility] LAB;  Service: Cardiovascular;  Laterality: N/A;  . ABDOMINAL AORTIC ENDOVASCULAR STENT GRAFT N/A 07/08/2014   Procedure: ABDOMINAL AORTIC ENDOVASCULAR STENT GRAFT/ GORE;  Surgeon: Angelia Mould, MD;  Location: [place];  Service: Vascular;  Laterality: N/A;  . CARDIAC CATHETERIZATION  2015  . COLONOSCOPY    . CORONARY ARTERY BYPASS GRAFT  1999   x 3 vessels  . LEFT HEART CATHETERIZATION WITH CORONARY/GRAFT ANGIOGRAM N/A 05/29/2014   Procedure: LEFT HEART CATHETERIZATION WITH [name];  Surgeon: [name], MD;  Location: [facility] CATH LAB;  Service: Cardiovascular;  Laterality: N/A;  . PENILE PROSTHESIS IMPLANT  08/2005  . RHINOPLASTY  1975    SOCIAL HISTORY: Social History   Tobacco Use  . Smoking status: Former Smoker    Types: Cigarettes    Quit date: 1979    Years since quitting: 41.9  . Smokeless tobacco: Never Used  . Tobacco comment: quit smoking in 1979  Substance Use Topics  . Alcohol  use: Yes    Comment: occasionally - once per week  . Drug use: No    FAMILY HISTORY: Family History  Problem Relation Age of Onset  . Prostate cancer Father 68  . Heart disease Father   . Skin cancer Father   . Heart attack Father   . Cancer Father   . Deep vein thrombosis Father   . Hyperlipidemia Father   . Hypertension Father   . Heart disease Mother   . Skin cancer Mother   . Heart attack Mother   . Cancer Mother   . Hyperlipidemia Mother   . Hypertension Mother   . Varicose Veins Mother   . Peripheral vascular disease Mother        amputation  . Sleep apnea Brother   . Hyperlipidemia Brother   . Hypertension Brother   . Prostate cancer Brother 38  . Colon cancer Other        aunt  . Skin cancer Brother   . Skin cancer Sister   . Cancer Sister   . Heart disease Sister   . Diabetes Neg Hx   . Stroke Neg Hx     ROS: Review of Systems  Constitutional: Positive for weight loss.  Respiratory: Negative for shortness of breath (at rest).   Cardiovascular: Negative for chest pain and palpitations.  Endo/Heme/Allergies:       Negative hot/cold intolerance    PHYSICAL EXAM: Blood pressure (!) 155/83, pulse 70, temperature 97.8 F  (36.6 C), temperature source Oral, height 5\' 9"  (1.753 m), weight 200 lb (90.7 kg), SpO2 99 %. Body mass index is 29.53 kg/m. Physical Exam Vitals signs reviewed.  Constitutional:      Appearance: Normal appearance. He is obese.  Cardiovascular:     Rate and Rhythm: Normal rate.     Pulses: Normal pulses.  Pulmonary:     Effort: Pulmonary effort is normal.     Breath sounds: Normal breath sounds.  Musculoskeletal: Normal range of motion.  Skin:    General: Skin is warm and dry.  Neurological:     Mental Status: He is alert and oriented to person, place, and time.  Psychiatric:        Mood and Affect: Mood normal.        Behavior: Behavior normal.     RECENT LABS AND TESTS: BMET    Component Value Date/Time   NA 137 08/14/2019 1030   K 4.4 08/14/2019 1030   CL 99 08/14/2019 1030   CO2 24 08/14/2019 1030   GLUCOSE 102 (H) 08/14/2019 1030   GLUCOSE 81 04/26/2019 1341   BUN 22 08/14/2019 1030   CREATININE 0.92 08/14/2019 1030   CREATININE 1.08 12/14/2015 1122   CALCIUM 9.9 08/14/2019 1030   GFRNONAA 79 08/14/2019 1030   GFRAA 91 08/14/2019 1030   Lab Results  Component Value Date   HGBA1C 5.8 (H) 12/10/2018   HGBA1C 5.8 (H) 06/06/2018   HGBA1C 5.6 08/14/2017   HGBA1C 5.7 (H) 04/19/2017   HGBA1C 5.9 (H) 01/18/2017   Lab Results  Component Value Date   INSULIN 15.4 12/10/2018   INSULIN 9.6 06/06/2018   INSULIN 10.8 08/14/2017   INSULIN 17.3 04/19/2017   INSULIN 16.1 01/18/2017   CBC    Component Value Date/Time   WBC 3.6 08/14/2019 1030   WBC 5.2 04/26/2019 1341   RBC 4.53 08/14/2019 1030   RBC 4.57 04/26/2019 1341   HGB 14.8 08/14/2019 1030   HCT 42.5 08/14/2019 1030   PLT  158 08/14/2019 1030   MCV 94 08/14/2019 1030   MCH 32.7 08/14/2019 1030   MCH 31.1 12/14/2015 1122   MCHC 34.8 08/14/2019 1030   MCHC 34.2 04/26/2019 1341   RDW 12.7 08/14/2019 1030   LYMPHSABS 2.0 04/26/2019 1341   LYMPHSABS 1.4 06/06/2018 0000   MONOABS 0.6 04/26/2019 1341    EOSABS 0.1 04/26/2019 1341   EOSABS 0.1 06/06/2018 0000   BASOSABS 0.0 04/26/2019 1341   BASOSABS 0.0 06/06/2018 0000   Iron/TIBC/Ferritin/ %Sat No results found for: IRON, TIBC, FERRITIN, IRONPCTSAT Lipid Panel     Component Value Date/Time   CHOL 124 09/12/2018 0837   TRIG 137 09/12/2018 0837   TRIG 256 09/14/2009 0000   HDL 43 09/12/2018 0837   CHOLHDL 2.9 09/12/2018 0837   CHOLHDL 3 12/07/2016 0852   VLDL 30.8 12/07/2016 0852   LDLCALC 54 09/12/2018 0837   LDLDIRECT 68.0 04/13/2015 0823   Hepatic Function Panel     Component Value Date/Time   PROT 6.9 12/10/2018 1353   ALBUMIN 4.9 (H) 12/10/2018 1353   AST 31 12/10/2018 1353   ALT 34 12/10/2018 1353   ALKPHOS 73 12/10/2018 1353   BILITOT 0.6 12/10/2018 1353      Component Value Date/Time   TSH 3.830 05/17/2019 0821   TSH 3.250 12/10/2018 1353   TSH 4.140 08/06/2018 1148      OBESITY BEHAVIORAL INTERVENTION VISIT  Today's visit was # 28   Starting weight: 230 lbs Starting date: 01/18/17 Today's weight : 200 lbs Today's date: 09/16/2019 Total lbs lost to date: 30 At least 15 minutes were spent on discussing the following behavioral intervention visit.   ASK: We discussed the diagnosis of obesity with [name] today and [place] agreed to give [place] permission to discuss obesity behavioral modification therapy today.  ASSESS: Pao has the diagnosis of obesity and his BMI today is 29.52 Amirr is in the action stage of change   ADVISE: Cin was educated on the multiple health risks of obesity as well as the benefit of weight loss to improve his health. He was advised of the need for long term treatment and the importance of lifestyle modifications to improve his current health and to decrease his risk of future health problems.  AGREE: Multiple dietary modification options and treatment options were discussed and  [place] agreed to follow the recommendations documented in the above note.  ARRANGE:  Javoni was educated on the importance of frequent visits to treat obesity as outlined per CMS and USPSTF guidelines and agreed to schedule his next follow up appointment today.  I, Trixie Dredge, am acting as transcriptionist for [name], MD I have reviewed the above documentation for accuracy and completeness, and I agree with the above. -[name], MD

## 2019-10-14 ENCOUNTER — Ambulatory Visit (INDEPENDENT_AMBULATORY_CARE_PROVIDER_SITE_OTHER): Payer: Medicare HMO | Admitting: Family Medicine

## 2019-10-14 ENCOUNTER — Other Ambulatory Visit: Payer: Self-pay

## 2019-10-14 ENCOUNTER — Encounter (INDEPENDENT_AMBULATORY_CARE_PROVIDER_SITE_OTHER): Payer: Self-pay | Admitting: Family Medicine

## 2019-10-14 VITALS — BP 157/68 | HR 71 | Temp 98.0°F | Ht 69.0 in | Wt 201.0 lb

## 2019-10-14 DIAGNOSIS — E669 Obesity, unspecified: Secondary | ICD-10-CM

## 2019-10-14 DIAGNOSIS — E559 Vitamin D deficiency, unspecified: Secondary | ICD-10-CM

## 2019-10-14 DIAGNOSIS — E038 Other specified hypothyroidism: Secondary | ICD-10-CM

## 2019-10-14 DIAGNOSIS — Z6830 Body mass index (BMI) 30.0-30.9, adult: Secondary | ICD-10-CM | POA: Diagnosis not present

## 2019-10-14 DIAGNOSIS — R739 Hyperglycemia, unspecified: Secondary | ICD-10-CM | POA: Diagnosis not present

## 2019-10-14 DIAGNOSIS — E7849 Other hyperlipidemia: Secondary | ICD-10-CM

## 2019-10-14 DIAGNOSIS — E66811 Obesity, class 1: Secondary | ICD-10-CM

## 2019-10-14 DIAGNOSIS — I10 Essential (primary) hypertension: Secondary | ICD-10-CM | POA: Diagnosis not present

## 2019-10-14 NOTE — Progress Notes (Signed)
Office: [phone]  /  Fax: [phone]   HPI:  Chief Complaint: OBESITY Philip [name] is here to discuss his progress with his obesity treatment plan. He is following a lower carbohydrate, vegetable and lean protein rich diet plan and states he is following his eating plan approximately 50% of the time. He states he is walking 60 minutes 7 times per week.  [name] was on vacation and did some celebration eating, but got back on track afterwards. He is staying very active with cutting trees on his land as well as walking and golfing.  Today's visit was #45 Starting weight: 230 lbs Starting date: 01/18/2017 Today's weight: 201 lbs  Today's date: 10/14/2019 Total lbs lost to date: 29  Total lbs lost since last in-office visit: 0  Hypertension [name] has a diagnosis of hypertension. He states his blood pressure at home is running 140's/60-70's, but was increased today. He denies chest pain.  Hyperglycemia [name] had a mildly elevated glucose reading on his last labs (102 on 08/14/2019). He has done well with diet and exercise.  Hyperlipidemia [name] has a diagnosis of hyperlipidemia and is stable on Lipitor. He is due for labs. No chest pain or myalgias.  Vitamin D deficiency [name] has a diagnosis of Vitamin D deficiency and is on prescription Vitamin D. He is at risk of over replacement. He is due to have labs rechecked.  Hypothyroidism [name] has a diagnosis of hypothyroidism and is stable on Synthroid. He is due to have labs checked.  ASSESSMENT AND PLAN:  Essential hypertension  Hyperglycemia - Plan: Comprehensive Metabolic Panel (CMET), HgB A1c, Insulin, random  Other hyperlipidemia - Plan: Lipid Panel With LDL/HDL Ratio  Vitamin D deficiency - Plan: Vitamin D (25 hydroxy)  Other specified hypothyroidism - Plan: T3, T4, free, TSH  Class 1 obesity with serious comorbidity and body mass index (BMI) of 30.0 to 30.9 in adult, unspecified obesity type - Starting BMI greater  then 30  PLAN:  Hypertension Kindred is working on healthy weight loss and exercise to improve blood pressure control. He will continue to check his blood pressure at home and will have labs checked. We will watch for signs of hypotension as he continues his lifestyle modifications.  Hyperglycemia [name] will have labs checked and will follow-up as directed.  Hyperlipidemia Intensive lifestyle modifications as the first line treatment for hyperlipidemia. We discussed many lifestyle modifications today and Chess will continue Lipitor,  continue to work on diet, exercise and weight loss efforts. He will have labs checked and follow-up as directed for review of lab results.  Vitamin D Deficiency [name] was informed that low Vitamin D levels contributes to fatigue and are associated with obesity, breast, and colon cancer. He will have routine testing of Vitamin D and follow-up with our clinic in 4 weeks.  Hypothyroidism [name] was informed of the importance of good thyroid control for overall health and that supertheraputic thyroid levels are dangerous and will not improve weight loss results. Will have labs checked and follow-up as directed.  Obesity [name] is currently in the action stage of change. As such, his goal is to maintain weight loss at this time. He has agreed to follow a lower carbohydrate, vegetable and lean protein rich diet plan. [name] has been instructed to work up to a goal of 150 minutes of combined cardio and strengthening exercise per week for weight loss and overall health benefits. We discussed the following Behavioral Modification Strategies today: holiday eating strategies.   [name] has agreed to  follow-up with our clinic in 4 weeks. He was informed of the importance of frequent follow-up visits to maximize his success with intensive lifestyle modifications for his multiple health conditions.  ALLERGIES: No Known Allergies  MEDICATIONS: Current Outpatient  Medications on File Prior to Visit  Medication Sig Dispense Refill  . atorvastatin (LIPITOR) 40 MG tablet TAKE 1 TABLET (40 MG TOTAL) BY MOUTH AT BEDTIME. 90 tablet 2  . B Complex-Biotin-FA (B-COMPLEX PO) Take 1 tablet by mouth daily.    . Cholecalciferol (VITAMIN D3) 5000 UNITS CAPS Take 5,000 Units by mouth daily.    Marland Kitchen co-enzyme Q-10 30 MG capsule Take 30 mg by mouth daily.    Marland Kitchen ELIQUIS 5 MG TABS tablet TAKE 1 TABLET TWICE DAILY 180 tablet 1  . hydrocortisone 2.5 % cream Apply 1 application topically as needed.    Marland Kitchen levothyroxine (SYNTHROID) 25 MCG tablet Take 1 tablet (25 mcg total) by mouth daily before breakfast. 30 tablet 0  . lisinopril (ZESTRIL) 10 MG tablet TAKE 1 TABLET EVERY DAY 90 tablet 3  . magnesium oxide (MAG-OX) 400 MG tablet Take 800 mg by mouth daily.    . Multiple Vitamins-Minerals (MULTIVITAMIN WITH MINERALS) tablet Take 1 tablet by mouth daily.    . Omega-3 Fatty Acids (OMEGA-3 FISH OIL) 1200 MG CAPS Take 2 1,200 mg capsules daily 90 capsule 3  . polyethylene glycol (MIRALAX / GLYCOLAX) packet Take 17 g by mouth daily.     No current facility-administered medications on file prior to visit.    PAST MEDICAL HISTORY: Past Medical History:  Diagnosis Date  . AAA (abdominal aortic aneurysm) ([place])   . Atrial fibrillation ([place])   . CAD (coronary artery disease)    had a MI , s/p CABG  . Cataracts, bilateral    immature  . CHF (congestive heart failure) ([place])   . Dysrhythmia    paroximal a fib  . GERD (gastroesophageal reflux disease)    takes Omeprazole daily  . History of colon polyps   . History of kidney stones   . History of shingles   . Hyperlipidemia   . Hypertension    takes Amlodipine,Metoprolol,and Lisinopril daily  . Joint pain   . Joint swelling   . Myocardial infarction [facility])    several with last one being 2001(but never knew about any except 2001)  . OSA (obstructive sleep apnea)    started CPAP 12/09  . Peripheral vascular disease ([place])   .  Pneumonia    as a child  . Skin cancer    several , one of them was melanoma (sees derm routinely)  . Tingling    feet occasionally  . Tinnitus     PAST SURGICAL HISTORY: Past Surgical History:  Procedure Laterality Date  . ABDOMINAL AORTAGRAM N/A 06/16/2014   Procedure: ABDOMINAL Maxcine Ham;  Surgeon: Angelia Mould, MD;  Location[facility] C[facility] LAB;  Service: Cardiovascular;  Laterality: N/A;  . ABDOMINAL AORTIC ENDOVASCULAR STENT GRAFT N/A 07/08/2014   Procedure: ABDOMINAL AORTIC ENDOVASCULAR STENT GRAFT/ GORE;  Surgeon: Angelia Mould, MD;  Location: [facility];  Service: Vascular;  Laterality: N/A;  . CARDIAC CATHETERIZATION  2015  . COLONOSCOPY    . CORONARY ARTERY BYPASS GRAFT  1999   x 3 vessels  . LEFT HEART CATHETERIZATION WITH CORONARY/GRAFT ANGIOGRAM N/A 05/29/2014   Procedure: LEFT HEART CATHETERIZATION WITH [name];  Surgeon: [name], MD;  Location: [facility] CATH LAB;  Service: Cardiovascular;  Laterality: N/A;  . PENILE PROSTHESIS IMPLANT  08/2005  . RHINOPLASTY  1975    SOCIAL HISTORY: Social History   Tobacco Use  . Smoking status: Former Smoker    Types: Cigarettes    Quit date: 1979    Years since quitting: 41.9  . Smokeless tobacco: Never Used  . Tobacco comment: quit smoking in 1979  Substance Use Topics  . Alcohol use: Yes    Comment: occasionally - once per week  . Drug use: No    FAMILY HISTORY: Family History  Problem Relation Age of Onset  . Prostate cancer Father 64  . Heart disease Father   . Skin cancer Father   . Heart attack Father   . Cancer Father   . Deep vein thrombosis Father   . Hyperlipidemia Father   . Hypertension Father   . Heart disease Mother   . Skin cancer Mother   . Heart attack Mother   . Cancer Mother   . Hyperlipidemia Mother   . Hypertension Mother   . Varicose Veins Mother   . Peripheral vascular disease Mother        amputation  . Sleep apnea Brother   . Hyperlipidemia Brother   .  Hypertension Brother   . Prostate cancer Brother 5  . Colon cancer Other        aunt  . Skin cancer Brother   . Skin cancer Sister   . Cancer Sister   . Heart disease Sister   . Diabetes Neg Hx   . Stroke Neg Hx    ROS: Review of Systems  Cardiovascular: Negative for chest pain.  Musculoskeletal: Negative for myalgias.   PHYSICAL EXAM: Blood pressure (!) 157/68, pulse 71, temperature 98 F (36.7 C), temperature source Oral, height 5\' 9"  (1.753 m), weight 201 lb (91.2 kg), SpO2 99 %. Body mass index is 29.68 kg/m. Physical Exam Vitals reviewed.  Constitutional:      Appearance: Normal appearance. He is obese.  Cardiovascular:     Rate and Rhythm: Normal rate.     Pulses: Normal pulses.  Pulmonary:     Effort: Pulmonary effort is normal.     Breath sounds: Normal breath sounds.  Musculoskeletal:        General: Normal range of motion.  Skin:    General: Skin is warm and dry.  Neurological:     Mental Status: He is alert and oriented to person, place, and time.  Psychiatric:        Behavior: Behavior normal.   RECENT LABS AND TESTS: BMET    Component Value Date/Time   NA 137 08/14/2019 1030   K 4.4 08/14/2019 1030   CL 99 08/14/2019 1030   CO2 24 08/14/2019 1030   GLUCOSE 102 (H) 08/14/2019 1030   GLUCOSE 81 04/26/2019 1341   BUN 22 08/14/2019 1030   CREATININE 0.92 08/14/2019 1030   CREATININE 1.08 12/14/2015 1122   CALCIUM 9.9 08/14/2019 1030   GFRNONAA 79 08/14/2019 1030   GFRAA 91 08/14/2019 1030   Lab Results  Component Value Date   HGBA1C 5.8 (H) 12/10/2018   HGBA1C 5.8 (H) 06/06/2018   HGBA1C 5.6 08/14/2017   HGBA1C 5.7 (H) 04/19/2017   HGBA1C 5.9 (H) 01/18/2017   Lab Results  Component Value Date   INSULIN 15.4 12/10/2018   INSULIN 9.6 06/06/2018   INSULIN 10.8 08/14/2017   INSULIN 17.3 04/19/2017   INSULIN 16.1 01/18/2017   CBC    Component Value Date/Time   WBC 3.6 08/14/2019 1030   WBC 5.2  04/26/2019 1341   RBC 4.53 08/14/2019  1030   RBC 4.57 04/26/2019 1341   HGB 14.8 08/14/2019 1030   HCT 42.5 08/14/2019 1030   PLT 158 08/14/2019 1030   MCV 94 08/14/2019 1030   MCH 32.7 08/14/2019 1030   MCH 31.1 12/14/2015 1122   MCHC 34.8 08/14/2019 1030   MCHC 34.2 04/26/2019 1341   RDW 12.7 08/14/2019 1030   LYMPHSABS 2.0 04/26/2019 1341   LYMPHSABS 1.4 06/06/2018 0000   MONOABS 0.6 04/26/2019 1341   EOSABS 0.1 04/26/2019 1341   EOSABS 0.1 06/06/2018 0000   BASOSABS 0.0 04/26/2019 1341   BASOSABS 0.0 06/06/2018 0000   Iron/TIBC/Ferritin/ %Sat No results found for: IRON, TIBC, FERRITIN, IRONPCTSAT Lipid Panel     Component Value Date/Time   CHOL 124 09/12/2018 0837   TRIG 137 09/12/2018 0837   TRIG 256 09/14/2009 0000   HDL 43 09/12/2018 0837   CHOLHDL 2.9 09/12/2018 0837   CHOLHDL 3 12/07/2016 0852   VLDL 30.8 12/07/2016 0852   LDLCALC 54 09/12/2018 0837   LDLDIRECT 68.0 04/13/2015 0823   Hepatic Function Panel     Component Value Date/Time   PROT 6.9 12/10/2018 1353   ALBUMIN 4.9 (H) 12/10/2018 1353   AST 31 12/10/2018 1353   ALT 34 12/10/2018 1353   ALKPHOS 73 12/10/2018 1353   BILITOT 0.6 12/10/2018 1353      Component Value Date/Time   TSH 3.830 05/17/2019 0821   TSH 3.250 12/10/2018 1353   TSH 4.140 08/06/2018 1148     OBESITY BEHAVIORAL INTERVENTION VISIT DOCUMENTATION FOR INSURANCE (~15 minutes)    ASK: We discussed the diagnosis of obesity with [name] today and Seanpaul agreed to give [place] permission to discuss obesity behavioral modification therapy today.  ASSESS: Asaun has the diagnosis of obesity and his BMI today is 29.7. Gumaro is in the action stage of change.   ADVISE: Hajime was educated on the multiple health risks of obesity as well as the benefit of weight loss to improve his health. He was advised of the need for long term treatment and the importance of lifestyle modifications to improve his current health and to decrease his risk of future health  problems.  AGREE: Multiple dietary modification options and treatment options were discussed and  Terone agreed to follow the recommendations documented in the above note.  ARRANGE: Malick was educated on the importance of frequent visits to treat obesity as outlined per CMS and USPSTF guidelines and agreed to schedule his next follow up appointment today.  I, Michaelene Song, am acting as Location manager for [name], MD I have reviewed the above documentation for accuracy and completeness, and I agree with the above. -[name], MD

## 2019-10-15 LAB — HEMOGLOBIN A1C
Est. average glucose Bld gHb Est-mCnc: 114 mg/dL
Hgb A1c MFr Bld: 5.6 % (ref 4.8–5.6)

## 2019-10-15 LAB — COMPREHENSIVE METABOLIC PANEL
ALT: 26 IU/L (ref 0–44)
AST: 27 IU/L (ref 0–40)
Albumin/Globulin Ratio: 2.7 — ABNORMAL HIGH (ref 1.2–2.2)
Albumin: 4.8 g/dL — ABNORMAL HIGH (ref 3.7–4.7)
Alkaline Phosphatase: 56 IU/L (ref 39–117)
BUN/Creatinine Ratio: 21 (ref 10–24)
BUN: 21 mg/dL (ref 8–27)
Bilirubin Total: 0.7 mg/dL (ref 0.0–1.2)
CO2: 24 mmol/L (ref 20–29)
Calcium: 9.5 mg/dL (ref 8.6–10.2)
Chloride: 103 mmol/L (ref 96–106)
Creatinine, Ser: 1 mg/dL (ref 0.76–1.27)
GFR calc Af Amer: 82 mL/min/{1.73_m2} (ref >59)
GFR calc non Af Amer: 71 mL/min/{1.73_m2} (ref >59)
Globulin, Total: 1.8 g/dL (ref 1.5–4.5)
Glucose: 103 mg/dL — ABNORMAL HIGH (ref 65–99)
Potassium: 4.4 mmol/L (ref 3.5–5.2)
Sodium: 141 mmol/L (ref 134–144)
Total Protein: 6.6 g/dL (ref 6.0–8.5)

## 2019-10-15 LAB — VITAMIN D 25 HYDROXY (VIT D DEFICIENCY, FRACTURES): Vit D, 25-Hydroxy: 58.5 ng/mL (ref 30.0–100.0)

## 2019-10-15 LAB — LIPID PANEL WITH LDL/HDL RATIO
Cholesterol, Total: 131 mg/dL (ref 100–199)
HDL: 57 mg/dL (ref >39)
LDL Chol Calc (NIH): 60 mg/dL (ref 0–99)
LDL/HDL Ratio: 1.1 ratio (ref 0.0–3.6)
Triglycerides: 68 mg/dL (ref 0–149)
VLDL Cholesterol Cal: 14 mg/dL (ref 5–40)

## 2019-10-15 LAB — INSULIN, RANDOM: INSULIN: 14.1 u[IU]/mL (ref 2.6–24.9)

## 2019-10-15 LAB — T4, FREE: Free T4: 1.11 ng/dL (ref 0.82–1.77)

## 2019-10-15 LAB — T3: T3, Total: 84 ng/dL (ref 71–180)

## 2019-10-15 LAB — TSH: TSH: 3.45 u[IU]/mL (ref 0.450–4.500)

## 2019-10-18 ENCOUNTER — Other Ambulatory Visit: Payer: Self-pay

## 2019-10-21 ENCOUNTER — Ambulatory Visit (INDEPENDENT_AMBULATORY_CARE_PROVIDER_SITE_OTHER): Payer: Medicare HMO | Admitting: Internal Medicine

## 2019-10-21 ENCOUNTER — Encounter: Payer: Self-pay | Admitting: Internal Medicine

## 2019-10-21 ENCOUNTER — Other Ambulatory Visit: Payer: Self-pay

## 2019-10-21 VITALS — BP 143/71 | HR 73 | Temp 96.9°F | Resp 16 | Ht 69.0 in | Wt 209.2 lb

## 2019-10-21 DIAGNOSIS — N402 Nodular prostate without lower urinary tract symptoms: Secondary | ICD-10-CM | POA: Diagnosis not present

## 2019-10-21 DIAGNOSIS — E7849 Other hyperlipidemia: Secondary | ICD-10-CM | POA: Diagnosis not present

## 2019-10-21 DIAGNOSIS — I10 Essential (primary) hypertension: Secondary | ICD-10-CM

## 2019-10-21 DIAGNOSIS — R739 Hyperglycemia, unspecified: Secondary | ICD-10-CM | POA: Diagnosis not present

## 2019-10-21 LAB — PSA: PSA: 4.22 ng/mL — ABNORMAL HIGH (ref 0.10–4.00)

## 2019-10-21 NOTE — Progress Notes (Signed)
Subjective:    Patient ID: Philip [name], male    DOB: Feb 18, 1941, 78 y.o.   MRN: [id number]  DOS:  10/21/2019 Type of visit - description: Routine visit In general feeling well. Has no major concerns. Doing great with diet, he is extremely active.  Wt Readings from Last 3 Encounters:  10/21/19 209 lb 4 oz (94.9 kg)  10/14/19 201 lb (91.2 kg)  09/16/19 200 lb (90.7 kg)   BP Readings from Last 3 Encounters:  10/21/19 (!) 143/71  10/14/19 (!) 157/68  09/16/19 (!) 155/83     Review of Systems Denies chest pain no difficulty breathing At this point he has no dysuria, difficulty urinating or any other LUTS  Past Medical History:  Diagnosis Date  . AAA (abdominal aortic aneurysm) ([place])   . Atrial fibrillation ([place])   . CAD (coronary artery disease)    had a MI , s/p CABG  . Cataracts, bilateral    immature  . CHF (congestive heart failure) ([place])   . Dysrhythmia    paroximal a fib  . GERD (gastroesophageal reflux disease)    takes Omeprazole daily  . History of colon polyps   . History of kidney stones   . History of shingles   . Hyperlipidemia   . Hypertension    takes Amlodipine,Metoprolol,and Lisinopril daily  . Joint pain   . Joint swelling   . Myocardial infarction [facility])    several with last one being 2001(but never knew about any except 2001)  . OSA (obstructive sleep apnea)    started CPAP 12/09  . Peripheral vascular disease ([place])   . Pneumonia    as a child  . Skin cancer    several , one of them was melanoma (sees derm routinely)  . Tingling    feet occasionally  . Tinnitus     Past Surgical History:  Procedure Laterality Date  . ABDOMINAL AORTAGRAM N/A 06/16/2014   Procedure: ABDOMINAL Maxcine Ham;  Surgeon: Angelia Mould, MD;  Location: [facility] CATH LAB;  Service: Cardiovascular;  Laterality: N/A;  . ABDOMINAL AORTIC ENDOVASCULAR STENT GRAFT N/A 07/08/2014   Procedure: ABDOMINAL AORTIC ENDOVASCULAR STENT GRAFT/ GORE;  Surgeon: Angelia Mould, MD;  Location: [facility];  Service: Vascular;  Laterality: N/A;  . CARDIAC CATHETERIZATION  2015  . COLONOSCOPY    . CORONARY ARTERY BYPASS GRAFT  1999   x 3 vessels  . LEFT HEART CATHETERIZATION WITH CORONARY/GRAFT ANGIOGRAM N/A 05/29/2014   Procedure: LEFT HEART CATHETERIZATION WITH [name];  Surgeon: [name], MD;  Location: [facility] CATH LAB;  Service: Cardiovascular;  Laterality: N/A;  . PENILE PROSTHESIS IMPLANT  08/2005  . RHINOPLASTY  1975    Social History   Socioeconomic History  . Marital status: Married    Spouse name: Not on file  . Number of children: 3  . Years of education: 2  . Highest education level: Not on file  Occupational History  . Occupation: retired, Actor  Tobacco Use  . Smoking status: Former Smoker    Types: Cigarettes    Quit date: 1979    Years since quitting: 42.0  . Smokeless tobacco: Never Used  . Tobacco comment: quit smoking in 1979  Substance and Sexual Activity  . Alcohol use: Yes    Comment: occasionally - once per week  . Drug use: No  . Sexual activity: Not Currently  Other Topics Concern  . Not on file  Social History Narrative   Lives w/  wife in a 2 story home.  Has one son and 2 stepchildren.  Retired for [employer].     Education: high school.   Social Determinants of Health   Financial Resource Strain:   . Difficulty of Paying Living Expenses: Not on file  Food Insecurity:   . Worried About Charity fundraiser in the Last Year: Not on file  . Ran Out of Food in the Last Year: Not on file  Transportation Needs:   . Lack of Transportation (Medical): Not on file  . Lack of Transportation (Non-Medical): Not on file  Physical Activity:   . Days of Exercise per Week: Not on file  . Minutes of Exercise per Session: Not on file  Stress:   . Feeling of Stress : Not on file  Social Connections:   . Frequency of Communication with Friends and Family: Not on file  . Frequency of Social Gatherings with  Friends and Family: Not on file  . Attends Religious Services: Not on file  . Active Member of Clubs or Organizations: Not on file  . Attends Archivist Meetings: Not on file  . Marital Status: Not on file  Intimate Partner Violence:   . Fear of Current or Ex-Partner: Not on file  . Emotionally Abused: Not on file  . Physically Abused: Not on file  . Sexually Abused: Not on file      Allergies as of 10/21/2019   No Known Allergies     Medication List       Accurate as of October 21, 2019 10:03 AM. If you have any questions, ask your nurse or doctor.        atorvastatin 40 MG tablet Commonly known as: LIPITOR TAKE 1 TABLET (40 MG TOTAL) BY MOUTH AT BEDTIME.   B-COMPLEX PO Take 1 tablet by mouth daily.   co-enzyme Q-10 30 MG capsule Take 30 mg by mouth daily.   Eliquis 5 MG Tabs tablet Generic drug: apixaban TAKE 1 TABLET TWICE DAILY   hydrocortisone 2.5 % cream Apply 1 application topically as needed.   levothyroxine 25 MCG tablet Commonly known as: SYNTHROID Take 1 tablet (25 mcg total) by mouth daily before breakfast.   lisinopril 10 MG tablet Commonly known as: ZESTRIL TAKE 1 TABLET EVERY DAY   magnesium oxide 400 MG tablet Commonly known as: MAG-OX Take 800 mg by mouth daily.   multivitamin with minerals tablet Take 1 tablet by mouth daily.   Omega-3 Fish Oil 1200 MG Caps Take 2 1,200 mg capsules daily   polyethylene glycol 17 g packet Commonly known as: MIRALAX / GLYCOLAX Take 17 g by mouth daily.   Vitamin D3 125 MCG (5000 UT) Caps Take 5,000 Units by mouth daily.           Objective:   Physical Exam BP (!) 143/71 (BP Location: Left Arm, Patient Position: Sitting, Cuff Size: Normal)   Pulse 73   Temp (!) 96.9 F (36.1 C) (Temporal)   Resp 16   Ht 5\' 9"  (1.753 m)   Wt 209 lb 4 oz (94.9 kg)   SpO2 100%   BMI 30.90 kg/m  General:   Well developed, NAD, BMI noted.  HEENT:  Normocephalic . Face symmetric,  atraumatic Lungs:  CTA B Normal respiratory effort, no intercostal retractions, no accessory muscle use. Heart: Occasionally irregular heartbeat (likely PVC, see last EKG), otherwise regular rate and rhythm,  no murmur.  no pretibial edema bilaterally  Abdomen:  Not distended, soft,  non-tender. No rebound or rigidity.   Skin: Not pale. Not jaundice DRE: Prostate is soft, nontender, at the right side there is again area that seems a little more prominent , it is smaller compared to 2019 , now ~  2 mm, not hard. Neurologic:  alert & oriented X3.  Speech normal, gait appropriate for age and unassisted Psych--  Cognition and judgment appear intact.  Cooperative with normal attention span and concentration.  Behavior appropriate. No anxious or depressed appearing.     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 Fib, paroxysmal  GERD  Chronic constipation OSA- Cpap (Dr [name]) ED- penile implant H/o skin cancer, Dr. [name] H/o Kidney stones 2016  PLAN:  Hyperglycemia: Diet controlled, doing great HTN: BP today 143/71, on losartan, used to take amlodipine 10 mg, now 5 mg .  BPs at home 140/70.   Would like to see  BPs slightly lower.  Monitor BPs at home and let me know if they are consistently more than 140s Hyperlipidemia: On Lipitor, last FLP excellent. Question of prostate nodule, see physical exam, area seems smaller compared to 2019.  Check a PSA L UTS: See last visit, he thinks he tried Flomax and did not help, currently not taking it and essentially no symptoms. RTC 6 months   This visit occurred during the SARS-CoV-2 public health emergency.  Safety protocols were in  place, including screening questions prior to the visit, additional usage of staff PPE, and extensive cleaning of exam room while observing appropriate contact time as indicated for disinfecting solutions.

## 2019-10-21 NOTE — Progress Notes (Signed)
Pre visit review using our clinic review tool, if applicable. No additional management support is needed unless otherwise documented below in the visit note. 

## 2019-10-21 NOTE — Patient Instructions (Addendum)
Please schedule Medicare Wellness with Glenard Haring.   GO TO THE LAB : Get the blood work     GO TO THE FRONT DESK Schedule your next appointment   for a checkup in 6 months, physical exam.  Check the  blood pressure weekly BP GOAL is between 110/65 and  135/85. If it is consistently higher or lower, let me know

## 2019-10-22 NOTE — Assessment & Plan Note (Signed)
Hyperglycemia: Diet controlled, doing great HTN: BP today 143/71, on losartan, used to take amlodipine 10 mg, now 5 mg .  BPs at home 140/70.   Would like to see  BPs slightly lower.  Monitor BPs at home and let me know if they are consistently more than 140s Hyperlipidemia: On Lipitor, last FLP excellent. Question of prostate nodule, see physical exam, area seems smaller compared to 2019.  Check a PSA L UTS: See last visit, he thinks he tried Flomax and did not help, currently not taking it and essentially no symptoms. RTC 6 months

## 2019-10-23 NOTE — Telephone Encounter (Signed)
error 

## 2019-11-11 ENCOUNTER — Other Ambulatory Visit: Payer: Self-pay

## 2019-11-11 ENCOUNTER — Ambulatory Visit (INDEPENDENT_AMBULATORY_CARE_PROVIDER_SITE_OTHER): Payer: Medicare HMO | Admitting: Family Medicine

## 2019-11-11 ENCOUNTER — Encounter (INDEPENDENT_AMBULATORY_CARE_PROVIDER_SITE_OTHER): Payer: Self-pay | Admitting: Family Medicine

## 2019-11-11 VITALS — BP 167/81 | HR 66 | Temp 98.1°F | Ht 69.0 in | Wt 206.0 lb

## 2019-11-11 DIAGNOSIS — I10 Essential (primary) hypertension: Secondary | ICD-10-CM | POA: Diagnosis not present

## 2019-11-11 DIAGNOSIS — E039 Hypothyroidism, unspecified: Secondary | ICD-10-CM

## 2019-11-11 DIAGNOSIS — E669 Obesity, unspecified: Secondary | ICD-10-CM

## 2019-11-11 DIAGNOSIS — Z6830 Body mass index (BMI) 30.0-30.9, adult: Secondary | ICD-10-CM

## 2019-11-11 MED ORDER — LEVOTHYROXINE SODIUM 25 MCG PO TABS: 25.0000 ug | ORAL_TABLET | Freq: Every day | ORAL | 1 refills | Status: DC

## 2019-11-13 NOTE — Progress Notes (Signed)
Chief Complaint:   OBESITY Philip [name] is here to discuss his progress with his obesity treatment plan along with follow-up of his obesity related diagnoses. Philip [name] is following a lower carbohydrate, vegetable and lean protein rich diet plan and states he is following his eating plan approximately 50% of the time. Philip [name] states he is walking, doing push ups and sit ups for 60-90 minutes 7 times per week.  Today's visit was #: 47 Starting weight: 230 lbs Starting date: 01/18/17 Today's weight: 206 lbs Today's date: 11/11/2019 Total lbs lost to date: 24 Total lbs lost since last in-office visit: 0  Interim History: Philip [name] mood has been a bit low with recent events and politics, and he hasn't cared as much about his health and eating. He has gained some weight over the holidays which is normal, but he is a bit frustrated with himself.  Subjective:   1. Acquired hypothyroidism Philip [name] is stable on Synthroid. He denies palpitations but his mood has been low lately.  2. Essential hypertension Philip [name] blood pressure is elevated today, normally better controlled. He states his blood pressure was 141/75 this morning. He denies chest pain, but notes increased stress with recent events.  Assessment/Plan:   1. Acquired hypothyroidism Patient with long-standing hypothyroidism, on levothyroxine therapy. He appears euthyroid. We will refill Synthroid for 2 months, and we will recheck labs in 2 months. Orders and follow up as documented in patient record.  Counseling . Good thyroid control is important for overall health. Supratherapeutic thyroid levels are dangerous and will not improve weight loss results. . The correct way to take levothyroxine is fasting, with water, separated by at least 30 minutes from breakfast, and separated by more than 4 hours from calcium, iron, multivitamins, acid reflux medications (PPIs).   - levothyroxine (SYNTHROID) 25 MCG tablet; Take 1 tablet (25 mcg  total) by mouth daily before breakfast.  Dispense: 30 tablet; Refill: 1  2. Essential hypertension Philip [name] is working on healthy weight loss and exercise to improve blood pressure control. We will watch for signs of hypotension as he continues his lifestyle modifications. Philip [name] agrees to continue his medications, and check his blood pressure at home. If his blood pressure continue to be elevated he is to discuss with his primary care physician.  3. Class 1 obesity with serious comorbidity and body mass index (BMI) of 30.0 to 30.9 in adult, unspecified obesity type Philip [name] is currently in the action stage of change. As such, his goal is to continue with weight loss efforts. He has agreed to following a lower carbohydrate, vegetable and lean protein rich diet plan.   We discussed the following exercise goals today: Older adults should follow the adult guidelines. When older adults cannot meet the adult guidelines, they should be as physically active as their abilities and conditions will allow.  Older adults should do exercises that maintain or improve balance if they are at risk of falling.   We discussed the following behavioral modification strategies today: decreasing simple carbohydrates and emotional eating strategies.  Philip [name] has agreed to follow-up with our clinic in 8 weeks. He was informed of the importance of frequent follow-up visits to maximize his success with intensive lifestyle modifications for his multiple health conditions.   Objective:   Blood pressure (!) 167/81, pulse 66, temperature 98.1 F (36.7 C), temperature source Oral, height 5\' 9"  (1.753 m), weight 206 lb (93.4 kg), SpO2 98 %. Body mass index is 30.42 kg/m.  General: Cooperative,  alert, well developed, in no acute distress. HEENT: Conjunctivae and lids unremarkable. Neck: No thyromegaly.  Cardiovascular: Regular rhythm.  Lungs: Normal work of breathing. Extremities: No edema.  Neurologic: No focal  deficits.   Lab Results  Component Value Date   CREATININE 1.00 10/14/2019   BUN 21 10/14/2019   NA 141 10/14/2019   K 4.4 10/14/2019   CL 103 10/14/2019   CO2 24 10/14/2019   Lab Results  Component Value Date   ALT 26 10/14/2019   AST 27 10/14/2019   ALKPHOS 56 10/14/2019   BILITOT 0.7 10/14/2019   Lab Results  Component Value Date   HGBA1C 5.6 10/14/2019   HGBA1C 5.8 (H) 12/10/2018   HGBA1C 5.8 (H) 06/06/2018   HGBA1C 5.6 08/14/2017   HGBA1C 5.7 (H) 04/19/2017   Lab Results  Component Value Date   INSULIN 14.1 10/14/2019   INSULIN 15.4 12/10/2018   INSULIN 9.6 06/06/2018   INSULIN 10.8 08/14/2017   INSULIN 17.3 04/19/2017   Lab Results  Component Value Date   TSH 3.450 10/14/2019   Lab Results  Component Value Date   CHOL 131 10/14/2019   HDL 57 10/14/2019   LDLCALC 60 10/14/2019   LDLDIRECT 68.0 04/13/2015   TRIG 68 10/14/2019   CHOLHDL 2.9 09/12/2018   Lab Results  Component Value Date   WBC 3.6 08/14/2019   HGB 14.8 08/14/2019   HCT 42.5 08/14/2019   MCV 94 08/14/2019   PLT 158 08/14/2019   No results found for: IRON, TIBC, FERRITIN  Obesity Behavioral Intervention Documentation for Insurance:   Approximately 15 minutes were spent on the discussion below.  ASK: We discussed the diagnosis of obesity with [name] today and Nyan agreed to give [place] permission to discuss obesity behavioral modification therapy today.  ASSESS: Lonney has the diagnosis of obesity and his BMI today is 30.41. Deshone is in the action stage of change.   ADVISE: Robertson was educated on the multiple health risks of obesity as well as the benefit of weight loss to improve his health. He was advised of the need for long term treatment and the importance of lifestyle modifications to improve his current health and to decrease his risk of future health problems.  AGREE: Multiple dietary modification options and treatment options were discussed and Usher agreed to  follow the recommendations documented in the above note.  ARRANGE: Neilan was educated on the importance of frequent visits to treat obesity as outlined per CMS and USPSTF guidelines and agreed to schedule his next follow up appointment today.  Attestation Statements:   Reviewed by clinician on day of visit: allergies, medications, problem list, medical history, surgical history, family history, social history, and previous encounter notes.   I, Trixie Dredge, am acting as transcriptionist for [name], MD.  I have reviewed the above documentation for accuracy and completeness, and I agree with the above. -  [name], MD

## 2019-11-27 DIAGNOSIS — L218 Other seborrheic dermatitis: Secondary | ICD-10-CM | POA: Diagnosis not present

## 2019-11-27 DIAGNOSIS — L819 Disorder of pigmentation, unspecified: Secondary | ICD-10-CM | POA: Diagnosis not present

## 2019-11-27 DIAGNOSIS — L570 Actinic keratosis: Secondary | ICD-10-CM | POA: Diagnosis not present

## 2019-11-29 ENCOUNTER — Ambulatory Visit: Payer: Medicare HMO

## 2019-12-07 ENCOUNTER — Ambulatory Visit: Payer: Medicare HMO | Attending: Internal Medicine

## 2019-12-07 DIAGNOSIS — Z23 Encounter for immunization: Secondary | ICD-10-CM

## 2019-12-07 NOTE — Progress Notes (Signed)
   Covid-19 Vaccination Clinic  Name:  MARNELL PARRILL    MRN: [id number] DOB: Mar 27, 1941  12/07/2019  Mr. Cohea was observed post Covid-19 immunization for 15 minutes without incidence. He was provided with Vaccine Information Sheet and instruction to access the V-Safe system.   Mr. Zenk was instructed to call 911 with any severe reactions post vaccine: Marland Kitchen Difficulty breathing  . Swelling of your face and throat  . A fast heartbeat  . A bad rash all over your body  . Dizziness and weakness    Immunizations Administered    Name Date Dose VIS Date Route   Pfizer COVID-19 Vaccine 12/07/2019  4:30 PM 0.3 mL 10/11/2019 Intramuscular   Manufacturer: [place]   Lot: [id number]   [place]: [id number]

## 2019-12-10 ENCOUNTER — Ambulatory Visit: Payer: Medicare HMO

## 2019-12-18 DIAGNOSIS — L578 Other skin changes due to chronic exposure to nonionizing radiation: Secondary | ICD-10-CM | POA: Diagnosis not present

## 2020-01-01 ENCOUNTER — Ambulatory Visit: Payer: Medicare HMO | Attending: Internal Medicine

## 2020-01-01 DIAGNOSIS — Z23 Encounter for immunization: Secondary | ICD-10-CM | POA: Insufficient documentation

## 2020-01-01 NOTE — Progress Notes (Signed)
   Covid-19 Vaccination Clinic  Name:  Philip [name]    MRN: [id number] DOB: 1941-01-15  01/01/2020  [name] was observed post Covid-19 immunization for 15 minutes without incident. He was provided with Vaccine Information Sheet and instruction to access the V-Safe system.   [name] was instructed to call 911 with any severe reactions post vaccine: Marland Kitchen Difficulty breathing  . Swelling of face and throat  . A fast heartbeat  . A bad rash all over body  . Dizziness and weakness   Immunizations Administered    Name Date Dose VIS Date Route   Pfizer COVID-19 Vaccine 01/01/2020 12:36 PM 0.3 mL 10/11/2019 Intramuscular   Manufacturer: [place]   Lot: [id number]   [place]: [id number]

## 2020-01-04 ENCOUNTER — Other Ambulatory Visit: Payer: Self-pay | Admitting: Internal Medicine

## 2020-01-06 ENCOUNTER — Encounter (INDEPENDENT_AMBULATORY_CARE_PROVIDER_SITE_OTHER): Payer: Self-pay | Admitting: Family Medicine

## 2020-01-06 ENCOUNTER — Ambulatory Visit (INDEPENDENT_AMBULATORY_CARE_PROVIDER_SITE_OTHER): Payer: Medicare HMO | Admitting: Family Medicine

## 2020-01-06 ENCOUNTER — Other Ambulatory Visit: Payer: Self-pay

## 2020-01-06 VITALS — BP 153/73 | HR 69 | Temp 98.5°F | Ht 69.0 in | Wt 206.0 lb

## 2020-01-06 DIAGNOSIS — Z6830 Body mass index (BMI) 30.0-30.9, adult: Secondary | ICD-10-CM | POA: Diagnosis not present

## 2020-01-06 DIAGNOSIS — E669 Obesity, unspecified: Secondary | ICD-10-CM | POA: Diagnosis not present

## 2020-01-06 DIAGNOSIS — E039 Hypothyroidism, unspecified: Secondary | ICD-10-CM

## 2020-01-06 MED ORDER — LEVOTHYROXINE SODIUM 25 MCG PO TABS: 25.0000 ug | ORAL_TABLET | Freq: Every day | ORAL | 0 refills | Status: DC

## 2020-01-06 NOTE — Progress Notes (Signed)
Chief Complaint:   OBESITY Philip [name] is here to discuss his progress with his obesity treatment plan along with follow-up of his obesity related diagnoses. Philip [name] is on following a lower carbohydrate, vegetable and lean protein rich diet plan and states he is following his eating plan approximately 60% of the time. Philip [name] states he is walking for 60 minutes 7 times per week.  Today's visit was #: 46 Starting weight: 230 lbs Starting date: 01/18/2017 Today's weight: 206 lbs Today's date: 01/06/2020 Total lbs lost to date: 24 Total lbs lost since last in-office visit: 0  Interim History: Philip [name] has done well maintaining his weight on the Low carbohydrate plan. He is starting a little walking and playing golf, and he is sleeping well. His hunger is controlled.  Subjective:   1. Acquired hypothyroidism Philip [name] TSH is stable on levothyroxine. He denies worsening fatigue and his heart rate is within normal limits.  Assessment/Plan:   1. Acquired hypothyroidism Patient with long-standing hypothyroidism, on levothyroxine therapy. He appears euthyroid. We will refill levothyroxine for 2 months. Orders and follow up as documented in patient record.  - levothyroxine (SYNTHROID) 25 MCG tablet; Take 1 tablet (25 mcg total) by mouth daily before breakfast.  Dispense: 30 tablet; Refill: 0  2. Class 1 obesity with serious comorbidity and body mass index (BMI) of 30.0 to 30.9 in adult, unspecified obesity type Philip [name] is currently in the action stage of change. As such, his goal is to continue with weight loss efforts. He has agreed to following a lower carbohydrate, vegetable and lean protein rich diet plan.   Exercise goals: Philip [name] is to continue his current exercise regimen as is.  Behavioral modification strategies: no skipping meals.  Philip [name] has agreed to follow-up with our clinic in 8 weeks. He was informed of the importance of frequent follow-up visits to maximize his success with intensive  lifestyle modifications for his multiple health conditions.   Objective:   Blood pressure (!) 153/73, pulse 69, temperature 98.5 F (36.9 C), temperature source Oral, height 5\' 9"  (1.753 m), weight 206 lb (93.4 kg), SpO2 98 %. Body mass index is 30.42 kg/m.  General: Cooperative, alert, well developed, in no acute distress. HEENT: Conjunctivae and lids unremarkable. Cardiovascular: Regular rhythm.  Lungs: Normal work of breathing. Neurologic: No focal deficits.   Lab Results  Component Value Date   CREATININE 1.00 10/14/2019   BUN 21 10/14/2019   NA 141 10/14/2019   K 4.4 10/14/2019   CL 103 10/14/2019   CO2 24 10/14/2019   Lab Results  Component Value Date   ALT 26 10/14/2019   AST 27 10/14/2019   ALKPHOS 56 10/14/2019   BILITOT 0.7 10/14/2019   Lab Results  Component Value Date   HGBA1C 5.6 10/14/2019   HGBA1C 5.8 (H) 12/10/2018   HGBA1C 5.8 (H) 06/06/2018   HGBA1C 5.6 08/14/2017   HGBA1C 5.7 (H) 04/19/2017   Lab Results  Component Value Date   INSULIN 14.1 10/14/2019   INSULIN 15.4 12/10/2018   INSULIN 9.6 06/06/2018   INSULIN 10.8 08/14/2017   INSULIN 17.3 04/19/2017   Lab Results  Component Value Date   TSH 3.450 10/14/2019   Lab Results  Component Value Date   CHOL 131 10/14/2019   HDL 57 10/14/2019   LDLCALC 60 10/14/2019   LDLDIRECT 68.0 04/13/2015   TRIG 68 10/14/2019   CHOLHDL 2.9 09/12/2018   Lab Results  Component Value Date   WBC 3.6 08/14/2019   HGB 14.8 08/14/2019  HCT 42.5 08/14/2019   MCV 94 08/14/2019   PLT 158 08/14/2019   No results found for: IRON, TIBC, FERRITIN  Obesity Behavioral Intervention Documentation for Insurance:   Approximately 15 minutes were spent on the discussion below.  ASK: We discussed the diagnosis of obesity with Philip [name] today and Philip [name] agreed to give [place] permission to discuss obesity behavioral modification therapy today.  ASSESS: Philip [name] has the diagnosis of obesity and his BMI today is 30.41.  Philip [name] is in the action stage of change.   ADVISE: Philip [name] was educated on the multiple health risks of obesity as well as the benefit of weight loss to improve his health. He was advised of the need for long term treatment and the importance of lifestyle modifications to improve his current health and to decrease his risk of future health problems.  AGREE: Multiple dietary modification options and treatment options were discussed and Philip [name] agreed to follow the recommendations documented in the above note.  ARRANGE: Philip [name] was educated on the importance of frequent visits to treat obesity as outlined per CMS and USPSTF guidelines and agreed to schedule his next follow up appointment today.  Attestation Statements:   Reviewed by clinician on day of visit: allergies, medications, problem list, medical history, surgical history, family history, social history, and previous encounter notes.   I, Trixie Dredge, am acting as transcriptionist for [name], MD.  I have reviewed the above documentation for accuracy and completeness, and I agree with the above. -  [name], MD

## 2020-01-06 NOTE — Telephone Encounter (Signed)
Please review for refills, Thanks !  

## 2020-02-15 NOTE — Progress Notes (Signed)
Cardiology Office Note   Date:  02/17/2020   ID:  Philip [name], DOB Feb 29, 1941, MRN [id number]  PCP:  Colon Branch, MD    No chief complaint on file.  CAD  Wt Readings from Last 3 Encounters:  02/17/20 211 lb 12.8 oz (96.1 kg)  01/06/20 206 lb (93.4 kg)  11/11/19 206 lb (93.4 kg)       History of Present Illness: Philip [name] is a 79 y.o. male  is a 79 y.o.year-old malewith history of CAD status post CABG (1999), AAA status post endovascular repair (2015), paroxysmal atrial fibrillation, HTN, hyperlipidemia, and CKD stage III, who presents for follow-up ofcoronary artery disease and PAF. Dr. Saunders Revel last saw him in February 2019, at which time he was doing well other than mild leg edema and modest weight gain. He subsequently obtained an echocardiogram, which showed preserved LV systolic function with grade 1 diastolic dysfunction.   Never had palpitations when he had AFib in the past.   His wife had orthopedic surgery in 2019.   H/o PAF so has been on lifelong anticoagulation due to CHADS-vasc of 4.   Since the virus, he is not going to the gym.  Lost 40 lbs.  Maintained weight loss for the most part.  Since the last visit, he has played golf and worked in the yard.  Denies : Chest pain. Dizziness. Leg edema. Nitroglycerin use. Orthopnea. Palpitations. Paroxysmal nocturnal dyspnea. Shortness of breath. Syncope.   BP has been elevated.  He has received both COVID vaccines.     Past Medical History:  Diagnosis Date  . AAA (abdominal aortic aneurysm) ([place])   . Atrial fibrillation ([place])   . CAD (coronary artery disease)    had a MI , s/p CABG  . Cataracts, bilateral    immature  . CHF (congestive heart failure) ([place])   . Dysrhythmia    paroximal a fib  . GERD (gastroesophageal reflux disease)    takes Omeprazole daily  . History of colon polyps   . History of kidney stones   . History of shingles   . Hyperlipidemia   . Hypertension    takes  Amlodipine,Metoprolol,and Lisinopril daily  . Joint pain   . Joint swelling   . Myocardial infarction [facility])    several with last one being 2001(but never knew about any except 2001)  . OSA (obstructive sleep apnea)    started CPAP 12/09  . Peripheral vascular disease ([place])   . Pneumonia    as a child  . Skin cancer    several , one of them was melanoma (sees derm routinely)  . Tingling    feet occasionally  . Tinnitus     Past Surgical History:  Procedure Laterality Date  . ABDOMINAL AORTAGRAM N/A 06/16/2014   Procedure: ABDOMINAL Maxcine Ham;  Surgeon: Angelia Mould, MD;  Location: [facility] CATH LAB;  Service: Cardiovascular;  Laterality: N/A;  . ABDOMINAL AORTIC ENDOVASCULAR STENT GRAFT N/A 07/08/2014   Procedure: ABDOMINAL AORTIC ENDOVASCULAR STENT GRAFT/ GORE;  Surgeon: Angelia Mould, MD;  Location: [place];  Service: Vascular;  Laterality: N/A;  . CARDIAC CATHETERIZATION  2015  . COLONOSCOPY    . CORONARY ARTERY BYPASS GRAFT  1999   x 3 vessels  . LEFT HEART CATHETERIZATION WITH CORONARY/GRAFT ANGIOGRAM N/A 05/29/2014   Procedure: LEFT HEART CATHETERIZATION WITH [name];  Surgeon: [name], MD;  Location: [facility] CATH LAB;  Service: Cardiovascular;  Laterality: N/A;  . PENILE PROSTHESIS  IMPLANT  08/2005  . RHINOPLASTY  1975     Current Outpatient Medications  Medication Sig Dispense Refill  . amLODipine (NORVASC) 5 MG tablet TAKE 1 TABLET EVERY DAY 90 tablet 1  . atorvastatin (LIPITOR) 40 MG tablet TAKE 1 TABLET (40 MG TOTAL) BY MOUTH AT BEDTIME. 90 tablet 2  . B Complex-Biotin-FA (B-COMPLEX PO) Take 1 tablet by mouth daily.    . Cholecalciferol (VITAMIN D3) 5000 UNITS CAPS Take 5,000 Units by mouth daily.    Marland Kitchen co-enzyme Q-10 30 MG capsule Take 30 mg by mouth daily.    Marland Kitchen ELIQUIS 5 MG TABS tablet TAKE 1 TABLET TWICE DAILY 180 tablet 1  . hydrocortisone 2.5 % cream Apply 1 application topically as needed.    Marland Kitchen levothyroxine (SYNTHROID) 25 MCG tablet  Take 1 tablet (25 mcg total) by mouth daily before breakfast. 30 tablet 0  . lisinopril (ZESTRIL) 10 MG tablet TAKE 1 TABLET EVERY DAY 90 tablet 3  . magnesium oxide (MAG-OX) 400 MG tablet Take 800 mg by mouth daily.    . Multiple Vitamins-Minerals (MULTIVITAMIN WITH MINERALS) tablet Take 1 tablet by mouth daily.    . Omega-3 Fatty Acids (OMEGA-3 FISH OIL) 1200 MG CAPS Take 2 1,200 mg capsules daily 90 capsule 3  . polyethylene glycol (MIRALAX / GLYCOLAX) packet Take 17 g by mouth daily.     No current facility-administered medications for this visit.    Allergies:   Patient has no known allergies.    Social History:  The patient  reports that he quit smoking about 42 years ago. His smoking use included cigarettes. He has never used smokeless tobacco. He reports current alcohol use. He reports that he does not use drugs.   Family History:  The patient's family history includes Cancer in his father, mother, and sister; Colon cancer in an other family member; Deep vein thrombosis in his father; Heart attack in his father and mother; Heart disease in his father, mother, and sister; Hyperlipidemia in his brother, father, and mother; Hypertension in his brother, father, and mother; Peripheral vascular disease in his mother; Prostate cancer (age of onset: 49) in his father; Prostate cancer (age of onset: 62) in his brother; Skin cancer in his brother, father, mother, and sister; Sleep apnea in his brother; Varicose Veins in his mother.    ROS:  Please see the history of present illness.   Otherwise, review of systems are positive for elevated BP readings of late.   All other systems are reviewed and negative.    PHYSICAL EXAM: VS:  BP (!) 158/82   Pulse 75   Ht 5\' 9"  (1.753 m)   Wt 211 lb 12.8 oz (96.1 kg)   SpO2 96%   BMI 31.28 kg/m  , BMI Body mass index is 31.28 kg/m. GEN: Well nourished, well developed, in no acute distress  HEENT: normal  Neck: no JVD, carotid bruits, or  masses Cardiac: RRR; no murmurs, rubs, or gallops,no edema  Respiratory:  clear to auscultation bilaterally, normal work of breathing GI: soft, nontender, nondistended, + BS MS: no deformity or atrophy  Skin: warm and dry, no rash Neuro:  Strength and sensation are intact Psych: euthymic mood, full affect   EKG:   The ekg ordered 08/2019 demonstrates NSR, PVC   Recent Labs: 08/14/2019: Hemoglobin 14.8; Platelets 158 10/14/2019: ALT 26; BUN 21; Creatinine, Ser 1.00; Potassium 4.4; Sodium 141; TSH 3.450   Lipid Panel    Component Value Date/Time   CHOL 131  10/14/2019 0841   TRIG 68 10/14/2019 0841   TRIG 256 09/14/2009 0000   HDL 57 10/14/2019 0841   CHOLHDL 2.9 09/12/2018 0837   CHOLHDL 3 12/07/2016 0852   VLDL 30.8 12/07/2016 0852   LDLCALC 60 10/14/2019 0841   LDLDIRECT 68.0 04/13/2015 0823     Other studies Reviewed: Additional studies/ records that were reviewed today with results demonstrating: PMD labs reviewed from 10/2019, 08/2019.   ASSESSMENT AND PLAN:  1. CAD: COntinue aggressive secondary prevention. 2. PAF: Eliquis for stroke prevention. Eliquis labs reviewed.  Maintaining NSR. 3. HTN: Meds reduced after weight loss.  Readings are higher since he gained a little weight back.  Increase lisinopril 20 mg daily.  He will send [place] some readings via MyChart.  If BP still high, would increase lisinopril to 40 mg.  Another option would e to restart amlodipine 5 mg daily. Low salt diet recommended. 4. Hyperlipidemia: Whole food plant based diet recommended.  TC 131 in 12/20. 5. PAD: s/p AAA repair.    Current medicines are reviewed at length with the patient today.  The patient concerns regarding his medicines were addressed.  The following changes have been made:  No change  Labs/ tests ordered today include: to be checked with PMD in the near future No orders of the defined types were placed in this encounter.   Recommend 150 minutes/week of aerobic  exercise Low fat, low carb, high fiber diet recommended  Disposition:   FU in 1 year   Signed, Larae Grooms, MD  02/17/2020 8:16 AM    [facility] [address], [place], [place]  [address] Phone: [phone]; Fax: [phone]

## 2020-02-17 ENCOUNTER — Ambulatory Visit: Payer: Medicare HMO | Admitting: Interventional Cardiology

## 2020-02-17 ENCOUNTER — Encounter: Payer: Self-pay | Admitting: Interventional Cardiology

## 2020-02-17 ENCOUNTER — Other Ambulatory Visit: Payer: Self-pay

## 2020-02-17 VITALS — BP 158/82 | HR 75 | Ht 69.0 in | Wt 211.8 lb

## 2020-02-17 DIAGNOSIS — I25118 Atherosclerotic heart disease of native coronary artery with other forms of angina pectoris: Secondary | ICD-10-CM | POA: Diagnosis not present

## 2020-02-17 DIAGNOSIS — I2510 Atherosclerotic heart disease of native coronary artery without angina pectoris: Secondary | ICD-10-CM

## 2020-02-17 DIAGNOSIS — I480 Paroxysmal atrial fibrillation: Secondary | ICD-10-CM

## 2020-02-17 DIAGNOSIS — I10 Essential (primary) hypertension: Secondary | ICD-10-CM

## 2020-02-17 DIAGNOSIS — I739 Peripheral vascular disease, unspecified: Secondary | ICD-10-CM | POA: Diagnosis not present

## 2020-02-17 DIAGNOSIS — I714 Abdominal aortic aneurysm, without rupture, unspecified: Secondary | ICD-10-CM

## 2020-02-17 DIAGNOSIS — E782 Mixed hyperlipidemia: Secondary | ICD-10-CM | POA: Diagnosis not present

## 2020-02-17 MED ORDER — LISINOPRIL 20 MG PO TABS: 20.0000 mg | ORAL_TABLET | Freq: Every day | ORAL | 3 refills | Status: DC

## 2020-02-17 NOTE — Patient Instructions (Signed)
Medication Instructions:  Your physician has recommended you make the following change in your medication:   INCREASE: lisinopril to 20 mg once a day   *If you need a refill on your cardiac medications before your next appointment, please call your pharmacy*   Lab Work: None ordered today--to be done with Primary Care doctor If you have labs (blood work) drawn today and your tests are completely normal, you will receive your results only by: Marland Kitchen MyChart Message (if you have MyChart) OR . A paper copy in the mail If you have any lab test that is abnormal or we need to change your treatment, we will call you to review the results.   Testing/Procedures: None ordered   Follow-Up: At [facility], you and your health needs are our priority.  As part of our continuing mission to provide you with exceptional heart care, we have created designated Provider Care Teams.  These Care Teams include your primary Cardiologist (physician) and Advanced Practice Providers (APPs -  Physician Assistants and Nurse Practitioners) who all work together to provide you with the care you need, when you need it.  We recommend signing up for the patient portal called "MyChart".  Sign up information is provided on this After Visit Summary.  MyChart is used to connect with patients for Virtual Visits (Telemedicine).  Patients are able to view lab/test results, encounter notes, upcoming appointments, etc.  Non-urgent messages can be sent to your provider as well.   To learn more about what you can do with MyChart, go to [email or web address].    Your next appointment:   12 month(s)  The format for your next appointment:   In Person  Provider:   You may see Larae Grooms, MD or one of the following Advanced Practice Providers on your designated Care Team:    Melina Copa, PA-C  Ermalinda Barrios, PA-C    Other Instructions Your physician has requested that you regularly monitor and record your blood  pressure readings at home. Please use the same machine at the same time of day to check your readings and record them and send readings through [place].   High-Fiber Diet Fiber, also called dietary fiber, is a type of carbohydrate that is found in fruits, vegetables, whole grains, and beans. A high-fiber diet can have many health benefits. Your health care provider may recommend a high-fiber diet to help:  Prevent constipation. Fiber can make your bowel movements more regular.  Lower your cholesterol.  Relieve the following conditions: ? Swelling of veins in the anus (hemorrhoids). ? Swelling and irritation (inflammation) of specific areas of the digestive tract (uncomplicated diverticulosis). ? A problem of the large intestine (colon) that sometimes causes pain and diarrhea (irritable bowel syndrome, IBS).  Prevent overeating as part of a weight-loss plan.  Prevent heart disease, type 2 diabetes, and certain cancers. What is my plan? The recommended daily fiber intake in grams (g) includes:  38 g for men age 20 or younger.  30 g for men over age 18.  6 g for women age 68 or younger.  21 g for women over age 67. You can get the recommended daily intake of dietary fiber by:  Eating a variety of fruits, vegetables, grains, and beans.  Taking a fiber supplement, if it is not possible to get enough fiber through your diet. What do I need to know about a high-fiber diet?  It is better to get fiber through food sources rather than from fiber supplements. There is  not a lot of research about how effective supplements are.  Always check the fiber content on the nutrition facts label of any prepackaged food. Look for foods that contain 5 g of fiber or more per serving.  Talk with a diet and nutrition specialist (dietitian) if you have questions about specific foods that are recommended or not recommended for your medical condition, especially if those foods are not listed  below.  Gradually increase how much fiber you consume. If you increase your intake of dietary fiber too quickly, you may have bloating, cramping, or gas.  Drink plenty of water. Water helps you to digest fiber. What are tips for following this plan?  Eat a wide variety of high-fiber foods.  Make sure that half of the grains that you eat each day are whole grains.  Eat breads and cereals that are made with whole-grain flour instead of refined flour or white flour.  Eat brown rice, bulgur wheat, or millet instead of white rice.  Start the day with a breakfast that is high in fiber, such as a cereal that contains 5 g of fiber or more per serving.  Use beans in place of meat in soups, salads, and pasta dishes.  Eat high-fiber snacks, such as berries, raw vegetables, nuts, and popcorn.  Choose whole fruits and vegetables instead of processed forms like juice or sauce. What foods can I eat?  Fruits Berries. Pears. Apples. Oranges. Avocado. Prunes and raisins. Dried figs. Vegetables Sweet potatoes. Spinach. Kale. Artichokes. Cabbage. Broccoli. Cauliflower. Green peas. Carrots. Squash. Grains Whole-grain breads. Multigrain cereal. Oats and oatmeal. Brown rice. Barley. Bulgur wheat. Ina. Quinoa. Bran muffins. Popcorn. Rye wafer crackers. Meats and other proteins Navy, kidney, and pinto beans. Soybeans. Split peas. Lentils. Nuts and seeds. Dairy Fiber-fortified yogurt. Beverages Fiber-fortified soy milk. Fiber-fortified orange juice. Other foods Fiber bars. The items listed above may not be a complete list of recommended foods and beverages. Contact a dietitian for more options. What foods are not recommended? Fruits Fruit juice. Cooked, strained fruit. Vegetables Fried potatoes. Canned vegetables. Well-cooked vegetables. Grains White bread. Pasta made with refined flour. White rice. Meats and other proteins Fatty cuts of meat. Fried chicken or fried fish. Dairy Milk.  Yogurt. Cream cheese. Sour cream. Fats and oils Butters. Beverages Soft drinks. Other foods Cakes and pastries. The items listed above may not be a complete list of foods and beverages to avoid. Contact a dietitian for more information. Summary  Fiber is a type of carbohydrate. It is found in fruits, vegetables, whole grains, and beans.  There are many health benefits of eating a high-fiber diet, such as preventing constipation, lowering blood cholesterol, helping with weight loss, and reducing your risk of heart disease, diabetes, and certain cancers.  Gradually increase your intake of fiber. Increasing too fast can result in cramping, bloating, and gas. Drink plenty of water while you increase your fiber.  The best sources of fiber include whole fruits and vegetables, whole grains, nuts, seeds, and beans. This information is not intended to replace advice given to you by your health care provider. Make sure you discuss any questions you have with your health care provider. Document Revised: 08/21/2017 Document Reviewed: 08/21/2017 Elsevier Patient Education  2020 [employer].

## 2020-02-17 NOTE — Addendum Note (Signed)
Addended by: Drue Novel I on: 02/17/2020 09:45 AM   Modules accepted: Orders

## 2020-03-02 ENCOUNTER — Encounter (INDEPENDENT_AMBULATORY_CARE_PROVIDER_SITE_OTHER): Payer: Self-pay | Admitting: Family Medicine

## 2020-03-02 ENCOUNTER — Ambulatory Visit (INDEPENDENT_AMBULATORY_CARE_PROVIDER_SITE_OTHER): Payer: Medicare HMO | Admitting: Family Medicine

## 2020-03-02 ENCOUNTER — Other Ambulatory Visit: Payer: Self-pay

## 2020-03-02 VITALS — BP 133/65 | HR 69 | Temp 98.1°F | Ht 69.0 in | Wt 205.0 lb

## 2020-03-02 DIAGNOSIS — E669 Obesity, unspecified: Secondary | ICD-10-CM

## 2020-03-02 DIAGNOSIS — E8881 Metabolic syndrome: Secondary | ICD-10-CM

## 2020-03-02 DIAGNOSIS — Z6830 Body mass index (BMI) 30.0-30.9, adult: Secondary | ICD-10-CM

## 2020-03-02 DIAGNOSIS — E038 Other specified hypothyroidism: Secondary | ICD-10-CM

## 2020-03-02 DIAGNOSIS — E559 Vitamin D deficiency, unspecified: Secondary | ICD-10-CM

## 2020-03-02 MED ORDER — LEVOTHYROXINE SODIUM 25 MCG PO TABS: 25.0000 ug | ORAL_TABLET | Freq: Every day | ORAL | 1 refills | Status: DC

## 2020-03-02 NOTE — Progress Notes (Signed)
Chief Complaint:   OBESITY Philip [name] is here to discuss his progress with his obesity treatment plan along with follow-up of his obesity related diagnoses. Philip [name] is on following a lower carbohydrate, vegetable and lean protein rich diet plan and states he is following his eating plan approximately 60-70% of the time. Philip [name] states he is walking, doing pushups, and light weights for 60 minutes 5-6 times per week.  Today's visit was #: 52 Starting weight: 230 lbs Starting date: 01/18/2017 Today's weight: 205 lbs Today's date: 03/02/2020 Total lbs lost to date: 25 Total lbs lost since last in-office visit: 1  Interim History: Philip [name] continues to do well with decreasing simple carbohydrates in his diet. He notes this is more difficult with traveling for his grandson's games. He is doing very well with exercise and feels well overall.  Subjective:   1. Other specified hypothyroidism Philip [name] is stable on his medications and denies fatigue or tremors, or palpitations. He is due for labs.   2. Insulin resistance Philip [name] is stable on diet, and he feels well and denies hypoglycemia.  3. Vitamin D deficiency Philip [name] is stable on Vit D OTC, last Vit D level was at goal.  Assessment/Plan:   1. Other specified hypothyroidism Patient with long-standing hypothyroidism. He appears euthyroid. We will check labs today, and we will refill levothyroxine for 2 months. Orders and follow up as documented in patient record.  - Comprehensive metabolic panel  - levothyroxine (SYNTHROID) 25 MCG tablet; Take 1 tablet (25 mcg total) by mouth daily before breakfast.  Dispense: 30 tablet; Refill: 1  2. Insulin resistance Philip [name] will continue to work on weight loss, diet, exercise, and decreasing simple carbohydrates to help decrease the risk of diabetes. We will check labs today. Philip [name] agreed to follow-up with [place] as directed to closely monitor his progress.  - Hemoglobin A1c - Insulin, random  3. Vitamin D  deficiency Low Vitamin D level contributes to fatigue and are associated with obesity, breast, and colon cancer. Philip [name] agreed to continue OTC Vit D 5,000 IU every other day. He will follow-up for routine testing of Vitamin D, at least 2-3 times per year to avoid over-replacement. We will check labs today.  - VITAMIN D 25 Hydroxy (Vit-D Deficiency, Fractures)  4. Class 1 obesity with serious comorbidity and body mass index (BMI) of 30.0 to 30.9 in adult, unspecified obesity type Philip [name] is currently in the action stage of change. As such, his goal is to continue with weight loss efforts. He has agreed to following a lower carbohydrate, vegetable and lean protein rich diet plan.   Exercise goals: As is.  Behavioral modification strategies: travel eating strategies.  Philip [name] has agreed to follow-up with our clinic in 6 to 8 weeks. He was informed of the importance of frequent follow-up visits to maximize his success with intensive lifestyle modifications for his multiple health conditions.   Philip [name] was informed we would discuss his lab results at his next visit unless there is a critical issue that needs to be addressed sooner. Philip [name] agreed to keep his next visit at the agreed upon time to discuss these results.  Objective:   Blood pressure 133/65, pulse 69, temperature 98.1 F (36.7 C), temperature source Oral, height 5\' 9"  (1.753 m), weight 205 lb (93 kg), SpO2 98 %. Body mass index is 30.27 kg/m.  General: Cooperative, alert, well developed, in no acute distress. HEENT: Conjunctivae and lids unremarkable. Cardiovascular: Regular rhythm.  Lungs: Normal work of breathing. Neurologic: No  focal deficits.   Lab Results  Component Value Date   CREATININE 1.00 10/14/2019   BUN 21 10/14/2019   NA 141 10/14/2019   K 4.4 10/14/2019   CL 103 10/14/2019   CO2 24 10/14/2019   Lab Results  Component Value Date   ALT 26 10/14/2019   AST 27 10/14/2019   ALKPHOS 56 10/14/2019   BILITOT 0.7  10/14/2019   Lab Results  Component Value Date   HGBA1C 5.6 10/14/2019   HGBA1C 5.8 (H) 12/10/2018   HGBA1C 5.8 (H) 06/06/2018   HGBA1C 5.6 08/14/2017   HGBA1C 5.7 (H) 04/19/2017   Lab Results  Component Value Date   INSULIN 14.1 10/14/2019   INSULIN 15.4 12/10/2018   INSULIN 9.6 06/06/2018   INSULIN 10.8 08/14/2017   INSULIN 17.3 04/19/2017   Lab Results  Component Value Date   TSH 3.450 10/14/2019   Lab Results  Component Value Date   CHOL 131 10/14/2019   HDL 57 10/14/2019   LDLCALC 60 10/14/2019   LDLDIRECT 68.0 04/13/2015   TRIG 68 10/14/2019   CHOLHDL 2.9 09/12/2018   Lab Results  Component Value Date   WBC 3.6 08/14/2019   HGB 14.8 08/14/2019   HCT 42.5 08/14/2019   MCV 94 08/14/2019   PLT 158 08/14/2019   No results found for: IRON, TIBC, FERRITIN  Obesity Behavioral Intervention Documentation for Insurance:   Approximately 15 minutes were spent on the discussion below.  ASK: We discussed the diagnosis of obesity with Philip [name] today and Philip [name] agreed to give [place] permission to discuss obesity behavioral modification therapy today.  ASSESS: Philip [name] has the diagnosis of obesity and his BMI today is 30.26. Philip [name] is in the action stage of change.   ADVISE: Philip [name] was educated on the multiple health risks of obesity as well as the benefit of weight loss to improve his health. He was advised of the need for long term treatment and the importance of lifestyle modifications to improve his current health and to decrease his risk of future health problems.  AGREE: Multiple dietary modification options and treatment options were discussed and Philip [name] agreed to follow the recommendations documented in the above note.  ARRANGE: Philip [name] was educated on the importance of frequent visits to treat obesity as outlined per CMS and USPSTF guidelines and agreed to schedule his next follow up appointment today.  Attestation Statements:   Reviewed by clinician on day of  visit: allergies, medications, problem list, medical history, surgical history, family history, social history, and previous encounter notes.   I, Trixie Dredge, am acting as transcriptionist for [name], MD.  I have reviewed the above documentation for accuracy and completeness, and I agree with the above. - [name], MD

## 2020-03-03 LAB — COMPREHENSIVE METABOLIC PANEL
ALT: 24 IU/L (ref 0–44)
AST: 24 IU/L (ref 0–40)
Albumin/Globulin Ratio: 2.3 — ABNORMAL HIGH (ref 1.2–2.2)
Albumin: 4.6 g/dL (ref 3.7–4.7)
Alkaline Phosphatase: 56 IU/L (ref 39–117)
BUN/Creatinine Ratio: 18 (ref 10–24)
BUN: 18 mg/dL (ref 8–27)
Bilirubin Total: 0.8 mg/dL (ref 0.0–1.2)
CO2: 26 mmol/L (ref 20–29)
Calcium: 10.1 mg/dL (ref 8.6–10.2)
Chloride: 103 mmol/L (ref 96–106)
Creatinine, Ser: 0.98 mg/dL (ref 0.76–1.27)
GFR calc Af Amer: 84 mL/min/{1.73_m2} (ref >59)
GFR calc non Af Amer: 73 mL/min/{1.73_m2} (ref >59)
Globulin, Total: 2 g/dL (ref 1.5–4.5)
Glucose: 94 mg/dL (ref 65–99)
Potassium: 4.6 mmol/L (ref 3.5–5.2)
Sodium: 144 mmol/L (ref 134–144)
Total Protein: 6.6 g/dL (ref 6.0–8.5)

## 2020-03-03 LAB — VITAMIN D 25 HYDROXY (VIT D DEFICIENCY, FRACTURES): Vit D, 25-Hydroxy: 83.4 ng/mL (ref 30.0–100.0)

## 2020-03-03 LAB — INSULIN, RANDOM: INSULIN: 8.3 u[IU]/mL (ref 2.6–24.9)

## 2020-03-03 LAB — T4, FREE: Free T4: 1.17 ng/dL (ref 0.82–1.77)

## 2020-03-03 LAB — TSH: TSH: 4.66 u[IU]/mL — ABNORMAL HIGH (ref 0.450–4.500)

## 2020-03-03 LAB — HEMOGLOBIN A1C
Est. average glucose Bld gHb Est-mCnc: 120 mg/dL
Hgb A1c MFr Bld: 5.8 % — ABNORMAL HIGH (ref 4.8–5.6)

## 2020-03-03 LAB — T3: T3, Total: 91 ng/dL (ref 71–180)

## 2020-03-11 ENCOUNTER — Other Ambulatory Visit: Payer: Self-pay | Admitting: Internal Medicine

## 2020-03-11 DIAGNOSIS — I2510 Atherosclerotic heart disease of native coronary artery without angina pectoris: Secondary | ICD-10-CM

## 2020-03-12 NOTE — Telephone Encounter (Signed)
Please review for refill on Eliquis  

## 2020-03-12 NOTE — Telephone Encounter (Signed)
Eliquis 5mg  refill request received. Patient is 79 years old, weight-93kg, Crea-0.98 on 03/02/2020, Diagnosis-Afib, and last seen by Dr. Irish Lack on 02/17/2020. Dose is appropriate based on dosing criteria. Will send in refill to requested pharmacy.

## 2020-03-16 DIAGNOSIS — C44311 Basal cell carcinoma of skin of nose: Secondary | ICD-10-CM | POA: Diagnosis not present

## 2020-03-16 DIAGNOSIS — C44319 Basal cell carcinoma of skin of other parts of face: Secondary | ICD-10-CM | POA: Diagnosis not present

## 2020-03-16 DIAGNOSIS — D485 Neoplasm of uncertain behavior of skin: Secondary | ICD-10-CM | POA: Diagnosis not present

## 2020-04-15 ENCOUNTER — Encounter: Payer: Self-pay | Admitting: Internal Medicine

## 2020-04-15 ENCOUNTER — Ambulatory Visit (INDEPENDENT_AMBULATORY_CARE_PROVIDER_SITE_OTHER): Payer: Medicare HMO | Admitting: Internal Medicine

## 2020-04-15 ENCOUNTER — Other Ambulatory Visit: Payer: Self-pay

## 2020-04-15 VITALS — BP 163/77 | HR 65 | Temp 97.4°F | Resp 18 | Ht 69.0 in | Wt 211.1 lb

## 2020-04-15 DIAGNOSIS — M25551 Pain in right hip: Secondary | ICD-10-CM | POA: Diagnosis not present

## 2020-04-15 DIAGNOSIS — M5441 Lumbago with sciatica, right side: Secondary | ICD-10-CM | POA: Diagnosis not present

## 2020-04-15 MED ORDER — PREDNISONE 10 MG PO TABS
ORAL_TABLET | ORAL | 0 refills | Status: DC
Start: 2020-04-15 — End: 2020-04-24

## 2020-04-15 MED ORDER — CYCLOBENZAPRINE HCL 10 MG PO TABS
10.0000 mg | ORAL_TABLET | Freq: Every evening | ORAL | 0 refills | Status: DC | PRN
Start: 2020-04-15 — End: 2020-04-24

## 2020-04-15 NOTE — Progress Notes (Signed)
Subjective:    Patient ID: Philip [name], male    DOB: [birth year], 79 y.o.   MRN: [id number]  DOS:  04/15/2020 Type of visit - description: Acute Symptoms started 2 days ago. Pain is located at the lateral aspect of the right hip. Yesterday he played golf he was okay but the pain is gradually getting worse. He has noted in the last 24 hours some pain radiation to the lateral aspect of the right leg.  No low back pain No rash No injury No bladder or bowel incontinence No previous back surgeries    Review of Systems See above   Past Medical History:  Diagnosis Date  . AAA (abdominal aortic aneurysm) ([place])   . Atrial fibrillation ([place])   . CAD (coronary artery disease)    had a MI , s/p CABG  . Cataracts, bilateral    immature  . CHF (congestive heart failure) ([place])   . Dysrhythmia    paroximal a fib  . GERD (gastroesophageal reflux disease)    takes Omeprazole daily  . History of colon polyps   . History of kidney stones   . History of shingles   . Hyperlipidemia   . Hypertension    takes Amlodipine,Metoprolol,and Lisinopril daily  . Joint pain   . Joint swelling   . Myocardial infarction [facility])    several with last one being 2001(but never knew about any except 2001)  . OSA (obstructive sleep apnea)    started CPAP 12/09  . Peripheral vascular disease ([place])   . Pneumonia    as a child  . Skin cancer    several , one of them was melanoma (sees derm routinely)  . Tingling    feet occasionally  . Tinnitus     Past Surgical History:  Procedure Laterality Date  . ABDOMINAL AORTAGRAM N/A 06/16/2014   Procedure: ABDOMINAL Maxcine Ham;  Surgeon: Angelia Mould, MD;  Location: [facility] CATH LAB;  Service: Cardiovascular;  Laterality: N/A;  . ABDOMINAL AORTIC ENDOVASCULAR STENT GRAFT N/A 07/08/2014   Procedure: ABDOMINAL AORTIC ENDOVASCULAR STENT GRAFT/ GORE;  Surgeon: Angelia Mould, MD;  Location: [facility];  Service: Vascular;  Laterality: N/A;  . CARDIAC  CATHETERIZATION  2015  . COLONOSCOPY    . CORONARY ARTERY BYPASS GRAFT  1999   x 3 vessels  . LEFT HEART CATHETERIZATION WITH CORONARY/GRAFT ANGIOGRAM N/A 05/29/2014   Procedure: LEFT HEART CATHETERIZATION WITH [name];  Surgeon: [name], MD;  Location: [facility] CATH LAB;  Service: Cardiovascular;  Laterality: N/A;  . PENILE PROSTHESIS IMPLANT  08/2005  . RHINOPLASTY  1975    Allergies as of 04/15/2020   No Known Allergies     Medication List       Accurate as of April 15, 2020  4:04 PM. If you have any questions, ask your nurse or doctor.        amLODipine 5 MG tablet Commonly known as: NORVASC TAKE 1 TABLET EVERY DAY   atorvastatin 40 MG tablet Commonly known as: LIPITOR TAKE 1 TABLET (40 MG TOTAL) BY MOUTH AT BEDTIME.   B-COMPLEX PO Take 1 tablet by mouth daily.   co-enzyme Q-10 30 MG capsule Take 30 mg by mouth daily.   Eliquis 5 MG Tabs tablet Generic drug: apixaban TAKE 1 TABLET TWICE DAILY   hydrocortisone 2.5 % cream Apply 1 application topically as needed.   levothyroxine 25 MCG tablet Commonly known as: SYNTHROID Take 1 tablet (25 mcg total) by mouth daily before  breakfast.   lisinopril 20 MG tablet Commonly known as: ZESTRIL Take 1 tablet (20 mg total) by mouth daily.   magnesium oxide 400 MG tablet Commonly known as: MAG-OX Take 800 mg by mouth daily.   multivitamin with minerals tablet Take 1 tablet by mouth daily.   Omega-3 Fish Oil 1200 MG Caps Take 2 1,200 mg capsules daily   polyethylene glycol 17 g packet Commonly known as: MIRALAX / GLYCOLAX Take 17 g by mouth daily.   Vitamin D3 125 MCG (5000 UT) Caps Take 5,000 Units by mouth daily.          Objective:   Physical Exam Musculoskeletal:       Legs:    BP (!) 163/77 (BP Location: Left Arm, Patient Position: Sitting, Cuff Size: Normal)   Pulse 65   Temp (!) 97.4 F (36.3 C) (Temporal)   Resp 18   Ht 5\' 9"  (1.753 m)   Wt 211 lb 2 oz (95.8 kg)   SpO2  97%   BMI 31.18 kg/m  General:   Well developed, NAD, BMI noted. HEENT:  Normocephalic . Face symmetric, atraumatic MSK: No TTP at the lumbosacral spine. Slightly TTP at the SI right side and the right trochanteric bursa. Lower extremities: no pretibial edema bilaterally.  Good pedal pulses Skin: Not pale. Not jaundice Neurologic:  alert & oriented X3.  Speech normal, gait appropriate for age and unassisted. Motor exam normal in both lower extremities DTRs: Normal upper extremities Decrease symmetrically both legs. Psych--  Cognition and judgment appear intact.  Cooperative with normal attention span and concentration.  Behavior appropriate. No anxious or depressed appearing.      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 Fib, paroxysmal  GERD  Chronic constipation OSA- Cpap (Dr [name]) ED- penile implant H/o skin cancer, Dr. [name] H/o Kidney stones 2016  PLAN:  Right back and hip pain: As described above, most pain is located on the lateral aspect of the hip but he also has pain at the R SI joint. C/o  some radiation suspicious for a radiculopathy thus etiology not completely clear: radiculopathy versus SI joint pain versus back DJD. Plan: Conservative treatment with prednisone, Tylenol, ice pack and muscle relaxant at night, watch for drowsiness. If not better he will need further evaluation.  This visit occurred during the SARS-CoV-2 public health emergency.  Safety protocols were in place, including screening questions prior to the visit, additional usage of staff PPE, and extensive cleaning of exam room while observing appropriate contact time as indicated for  disinfecting solutions.

## 2020-04-15 NOTE — Progress Notes (Signed)
Pre visit review using our clinic review tool, if applicable. No additional management support is needed unless otherwise documented below in the visit note. 

## 2020-04-15 NOTE — Patient Instructions (Addendum)
Take prednisone as prescribed  Tylenol  500 mg OTC 2 tabs a day every 8 hours as needed for pain  Ice pack   Flexeril, a muscle relaxant at night, watch for drowsiness  Call if not gradually better in the next few days

## 2020-04-16 NOTE — Assessment & Plan Note (Signed)
Right back and hip pain: As described above, most pain is located on the lateral aspect of the hip but he also has pain at the R SI joint. C/o  some radiation suspicious for a radiculopathy thus etiology not completely clear: radiculopathy versus SI joint pain versus back DJD. Plan: Conservative treatment with prednisone, Tylenol, ice pack and muscle relaxant at night, watch for drowsiness. If not better he will need further evaluation.

## 2020-04-24 ENCOUNTER — Encounter: Payer: Self-pay | Admitting: Internal Medicine

## 2020-04-24 ENCOUNTER — Other Ambulatory Visit: Payer: Self-pay

## 2020-04-24 ENCOUNTER — Ambulatory Visit (INDEPENDENT_AMBULATORY_CARE_PROVIDER_SITE_OTHER): Payer: Medicare HMO | Admitting: Internal Medicine

## 2020-04-24 VITALS — BP 162/79 | HR 78 | Temp 97.0°F | Resp 18 | Ht 69.0 in | Wt 213.4 lb

## 2020-04-24 DIAGNOSIS — R399 Unspecified symptoms and signs involving the genitourinary system: Secondary | ICD-10-CM | POA: Diagnosis not present

## 2020-04-24 DIAGNOSIS — Z0000 Encounter for general adult medical examination without abnormal findings: Secondary | ICD-10-CM

## 2020-04-24 DIAGNOSIS — N429 Disorder of prostate, unspecified: Secondary | ICD-10-CM

## 2020-04-24 DIAGNOSIS — Z87898 Personal history of other specified conditions: Secondary | ICD-10-CM

## 2020-04-24 DIAGNOSIS — M5441 Lumbago with sciatica, right side: Secondary | ICD-10-CM

## 2020-04-24 DIAGNOSIS — R39198 Other difficulties with micturition: Secondary | ICD-10-CM

## 2020-04-24 LAB — CBC WITH DIFFERENTIAL/PLATELET
Basophils Absolute: 0 10*3/uL (ref 0.0–0.1)
Basophils Relative: 1.1 % (ref 0.0–3.0)
Eosinophils Absolute: 0.1 10*3/uL (ref 0.0–0.7)
Eosinophils Relative: 2.7 % (ref 0.0–5.0)
HCT: 42.4 % (ref 39.0–52.0)
Hemoglobin: 14.4 g/dL (ref 13.0–17.0)
Lymphocytes Relative: 44.4 % (ref 12.0–46.0)
Lymphs Abs: 1.6 10*3/uL (ref 0.7–4.0)
MCHC: 33.9 g/dL (ref 30.0–36.0)
MCV: 96.2 fl (ref 78.0–100.0)
Monocytes Absolute: 0.4 10*3/uL (ref 0.1–1.0)
Monocytes Relative: 11.1 % (ref 3.0–12.0)
Neutro Abs: 1.4 10*3/uL (ref 1.4–7.7)
Neutrophils Relative %: 40.7 % — ABNORMAL LOW (ref 43.0–77.0)
Platelets: 131 10*3/uL — ABNORMAL LOW (ref 150.0–400.0)
RBC: 4.41 Mil/uL (ref 4.22–5.81)
RDW: 13.4 % (ref 11.5–15.5)
WBC: 3.5 10*3/uL — ABNORMAL LOW (ref 4.0–10.5)

## 2020-04-24 LAB — URINALYSIS, ROUTINE W REFLEX MICROSCOPIC
Bilirubin Urine: NEGATIVE
Hgb urine dipstick: NEGATIVE
Ketones, ur: NEGATIVE
Leukocytes,Ua: NEGATIVE
Nitrite: NEGATIVE
Specific Gravity, Urine: 1.01 (ref 1.000–1.030)
Total Protein, Urine: NEGATIVE
Urine Glucose: NEGATIVE
Urobilinogen, UA: 0.2 (ref 0.0–1.0)
pH: 7.5 (ref 5.0–8.0)

## 2020-04-24 LAB — PSA: PSA: 4.37 ng/mL — ABNORMAL HIGH (ref 0.10–4.00)

## 2020-04-24 MED ORDER — AMLODIPINE BESYLATE 10 MG PO TABS
10.0000 mg | ORAL_TABLET | Freq: Every day | ORAL | 0 refills | Status: DC
Start: 2020-04-24 — End: 2020-09-11

## 2020-04-24 NOTE — Patient Instructions (Addendum)
Please schedule Medicare Wellness with Philip [name].    Increase amlodipine to 10 mg Continue checking your blood pressures We are referring you to the urologist We are referring you to the orthopedic doctor  Continue checking your blood pressure BP GOAL is between 110/65 and  135/85. If it is consistently higher or lower, let me know  GO TO THE LAB : Get the blood work     [place], [place] back for a checkup in 3 months

## 2020-04-24 NOTE — Progress Notes (Signed)
Pre visit review using our clinic review tool, if applicable. No additional management support is needed unless otherwise documented below in the visit note. 

## 2020-04-24 NOTE — Progress Notes (Signed)
Subjective:    Patient ID: Philip [name], male    DOB: [birth year], 79 y.o.   MRN: [id number]  DOS:  04/24/2020 Type of visit - description: Here for CPX but several other issues discussed  Continue with right back pain, right hip pain with radiation to the lateral right leg. Prednisone helped temporarily LUTS: c/o  slow flow, sometimes he stands for 8 to 10 minutes before he empties his bladder.  Denies dysuria, gross hematuria. HTN: Not well controlled    Review of Systems  Other than above, a 14 point review of systems is negative     Past Medical History:  Diagnosis Date  . AAA (abdominal aortic aneurysm) ([place])   . Atrial fibrillation ([place])   . CAD (coronary artery disease)    had a MI , s/p CABG  . Cataracts, bilateral    immature  . CHF (congestive heart failure) ([place])   . Dysrhythmia    paroximal a fib  . GERD (gastroesophageal reflux disease)    takes Omeprazole daily  . History of colon polyps   . History of kidney stones   . History of shingles   . Hyperlipidemia   . Hypertension    takes Amlodipine,Metoprolol,and Lisinopril daily  . Joint pain   . Joint swelling   . Myocardial infarction [facility])    several with last one being 2001(but never knew about any except 2001)  . OSA (obstructive sleep apnea)    started CPAP 12/09  . Peripheral vascular disease ([place])   . Pneumonia    as a child  . Skin cancer    several , one of them was melanoma (sees derm routinely)  . Tingling    feet occasionally  . Tinnitus     Past Surgical History:  Procedure Laterality Date  . ABDOMINAL AORTAGRAM N/A 06/16/2014   Procedure: ABDOMINAL Maxcine Ham;  Surgeon: Angelia Mould, MD;  Location: [facility] CATH LAB;  Service: Cardiovascular;  Laterality: N/A;  . ABDOMINAL AORTIC ENDOVASCULAR STENT GRAFT N/A 07/08/2014   Procedure: ABDOMINAL AORTIC ENDOVASCULAR STENT GRAFT/ GORE;  Surgeon: Angelia Mould, MD;  Location: [place];  Service: Vascular;  Laterality: N/A;  .  CARDIAC CATHETERIZATION  2015  . COLONOSCOPY    . CORONARY ARTERY BYPASS GRAFT  1999   x 3 vessels  . LEFT HEART CATHETERIZATION WITH CORONARY/GRAFT ANGIOGRAM N/A 05/29/2014   Procedure: LEFT HEART CATHETERIZATION WITH [name];  Surgeon: [name], MD;  Location: [facility] CATH LAB;  Service: Cardiovascular;  Laterality: N/A;  . PENILE PROSTHESIS IMPLANT  08/2005  . RHINOPLASTY  1975    Allergies as of 04/24/2020   No Known Allergies     Medication List       Accurate as of April 24, 2020 11:59 PM. If you have any questions, ask your nurse or doctor.        STOP taking these medications   cyclobenzaprine 10 MG tablet Commonly known as: FLEXERIL Stopped by: Kathlene November, MD   predniSONE 10 MG tablet Commonly known as: DELTASONE Stopped by: Kathlene November, MD     TAKE these medications   amLODipine 10 MG tablet Commonly known as: NORVASC Take 1 tablet (10 mg total) by mouth daily. What changed:   medication strength  how much to take Changed by: Kathlene November, MD   atorvastatin 40 MG tablet Commonly known as: LIPITOR TAKE 1 TABLET (40 MG TOTAL) BY MOUTH AT BEDTIME.   B-COMPLEX PO Take 1 tablet by mouth daily.  co-enzyme Q-10 30 MG capsule Take 30 mg by mouth daily.   Eliquis 5 MG Tabs tablet Generic drug: apixaban TAKE 1 TABLET TWICE DAILY   hydrocortisone 2.5 % cream Apply 1 application topically as needed.   levothyroxine 25 MCG tablet Commonly known as: SYNTHROID Take 1 tablet (25 mcg total) by mouth daily before breakfast.   lisinopril 20 MG tablet Commonly known as: ZESTRIL Take 1 tablet (20 mg total) by mouth daily.   magnesium oxide 400 MG tablet Commonly known as: MAG-OX Take 800 mg by mouth daily.   multivitamin with minerals tablet Take 1 tablet by mouth daily.   Omega-3 Fish Oil 1200 MG Caps Take 2 1,200 mg capsules daily   polyethylene glycol 17 g packet Commonly known as: MIRALAX / GLYCOLAX Take 17 g by mouth daily.   Vitamin  D3 125 MCG (5000 UT) Caps Take 5,000 Units by mouth daily.          Objective:   Physical Exam BP (!) 162/79 (BP Location: Left Arm, Patient Position: Sitting, Cuff Size: Normal)   Pulse 78   Temp (!) 97 F (36.1 C) (Temporal)   Resp 18   Ht 5\' 9"  (1.753 m)   Wt 213 lb 6 oz (96.8 kg)   SpO2 99%   BMI 31.51 kg/m  General: Well developed, NAD, BMI noted Neck: No  thyromegaly  HEENT:  Normocephalic . Face symmetric, atraumatic Lungs:  CTA B Normal respiratory effort, no intercostal retractions, no accessory muscle use. Heart: RRR,  no murmur.  Abdomen:  Not distended, soft, non-tender. No rebound or rigidity.  Aorta: Not palpable, no bruit. Lower extremities: no pretibial edema bilaterally DRE: Normal sphincter tone, brown stools, left side of the prostate normal, right side enlarged more so distally, not particularly tender, Skin: Exposed areas without rash. Not pale. Not jaundice Neurologic:  alert & oriented X3.  Speech normal, gait appropriate for age and unassisted Strength symmetric and appropriate for age.  Psych: Cognition and judgment appear intact.  Cooperative with normal attention span and concentration.  Behavior appropriate. No anxious or depressed appearing.     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 Fib, paroxysmal  GERD  Chronic constipation OSA- Cpap (Dr [name]) ED- penile implant H/o skin cancer, Dr. [name] H/o Kidney stones 2016  PLAN:  Here for CPX HTN: Currently on Amlodipine 5 mg lisinopril 20 mg.  Not well controlled, at home in the 150s, 160s.  Plan: Increase amlodipine to 10, monitor BPs at home, see AVS, reassess in 3  months. Slightly increased TSH: Follow-up by Dr. Leafy Ro Radiculopathy: See HPI, symptoms ongoing, refer to Ortho. RTC 3 months   This visit occurred during the SARS-CoV-2 public health emergency.  Safety protocols were in place, including screening questions prior to the visit, additional usage of staff PPE, and extensive cleaning of exam room while observing appropriate contact time as indicated for disinfecting solutions.

## 2020-04-25 ENCOUNTER — Encounter: Payer: Self-pay | Admitting: Internal Medicine

## 2020-04-25 LAB — URINE CULTURE
MICRO NUMBER:: 10635541
Result:: NO GROWTH
SPECIMEN QUALITY:: ADEQUATE

## 2020-04-25 NOTE — Assessment & Plan Note (Signed)
-  QV:5001 - pnm 23: 2018;prevnar :2015  s/p shingrix x 2 - -s/p zostavax ([employer]) -Had Covid shots -Recommend a flu shot yearly -CCS: Several Cscopes, had one in 2007 aprox. polyps x 6 , 03-2009, hyperplastic polyps. 09-2014: polyps, cscope 08/2019, next per GI Prostate cancer screening:  +FH  DRE today: Asymmetric, larger right lobule particularly distally. Check a PSA, UA, urine culture, refer to urology. Complain again of a very slow flow, Flomax did not help. -Diet and exercise: Doing well -Labs:CBC, PSA, UA, urine culture

## 2020-04-25 NOTE — Assessment & Plan Note (Addendum)
Here for CPX HTN: Currently on Amlodipine 5 mg lisinopril 20 mg.  Not well controlled, at home in the 150s, 160s.  Plan: Increase amlodipine to 10, monitor BPs at home, see AVS, reassess in 3 months. Slightly increased TSH: Follow-up by Dr. Leafy Ro Radiculopathy: See HPI, symptoms ongoing, refer to Ortho. RTC 3 months

## 2020-04-27 ENCOUNTER — Ambulatory Visit (INDEPENDENT_AMBULATORY_CARE_PROVIDER_SITE_OTHER): Payer: Medicare HMO | Admitting: Family Medicine

## 2020-04-27 ENCOUNTER — Encounter (INDEPENDENT_AMBULATORY_CARE_PROVIDER_SITE_OTHER): Payer: Self-pay | Admitting: Family Medicine

## 2020-04-27 ENCOUNTER — Other Ambulatory Visit: Payer: Self-pay

## 2020-04-27 VITALS — BP 132/77 | HR 59 | Temp 97.9°F | Ht 69.0 in | Wt 207.0 lb

## 2020-04-27 DIAGNOSIS — E669 Obesity, unspecified: Secondary | ICD-10-CM

## 2020-04-27 DIAGNOSIS — E038 Other specified hypothyroidism: Secondary | ICD-10-CM

## 2020-04-27 DIAGNOSIS — Z6830 Body mass index (BMI) 30.0-30.9, adult: Secondary | ICD-10-CM

## 2020-04-27 MED ORDER — LEVOTHYROXINE SODIUM 25 MCG PO TABS: 25.0000 ug | ORAL_TABLET | Freq: Every day | ORAL | 1 refills | Status: DC

## 2020-04-28 LAB — T4, FREE: Free T4: 1.24 ng/dL (ref 0.82–1.77)

## 2020-04-28 LAB — T3: T3, Total: 80 ng/dL (ref 71–180)

## 2020-04-28 LAB — TSH: TSH: 4.89 u[IU]/mL — ABNORMAL HIGH (ref 0.450–4.500)

## 2020-04-28 NOTE — Progress Notes (Signed)
Chief Complaint:   OBESITY Philip [name] is here to discuss his progress with his obesity treatment plan along with follow-up of his obesity related diagnoses. Philip [name] is on following a lower carbohydrate, vegetable and lean protein rich diet plan and states he is following his eating plan approximately 50-60% of the time. Philip [name] states he is doing some push-ups daily.  Today's visit was #: 75 Starting weight: 230 lbs Starting date: 01/18/2017 Today's weight: 207 lbs Today's date: 04/27/2020 Total lbs lost to date: 23 Total lbs lost since last in-office visit: 0  Interim History: Philip [name] hasn't been able to follow his Low carbohydrate plan as closely this last month. He is still golfing regularly, but he has had increased back pain and was referred to Ortho for evaluation. He hasn't been able to go to the gym regularly due to this.  Subjective:   1. Other specified hypothyroidism Philip [name] is on a low dose Synthroid, and last TSH was mildly elevated. He is due to have this rechecked.  Assessment/Plan:   1. Other specified hypothyroidism Patient with long-standing hypothyroidism, on levothyroxine therapy. He appears euthyroid. Philip [name] will continue levothyroxine, and we will refill for 1 month. We will check labs today, if TSH is still elevated, then may need to adjust levothyroxine dose. Orders and follow up as documented in patient record.  - levothyroxine (SYNTHROID) 25 MCG tablet; Take 1 tablet (25 mcg total) by mouth daily before breakfast.  Dispense: 30 tablet; Refill: 1 - T3 - T4, free - TSH  2. Class 1 obesity with serious comorbidity and body mass index (BMI) of 30.0 to 30.9 in adult, unspecified obesity type Philip [name] is currently in the action stage of change. As such, his goal is to continue with weight loss efforts. He has agreed to following a lower carbohydrate, vegetable and lean protein rich diet plan.   Exercise goals: As tolerated.  Behavioral modification strategies: meal  planning and cooking strategies.  Philip [name] has agreed to follow-up with our clinic in 3 to 4 weeks. He was informed of the importance of frequent follow-up visits to maximize his success with intensive lifestyle modifications for his multiple health conditions.   Philip [name] was informed we would discuss his lab results at his next visit unless there is a critical issue that needs to be addressed sooner. Philip [name] agreed to keep his next visit at the agreed upon time to discuss these results.  Objective:   Blood pressure 132/77, pulse (!) 59, temperature 97.9 F (36.6 C), temperature source Oral, height 5\' 9"  (1.753 m), weight 207 lb (93.9 kg), SpO2 98 %. Body mass index is 30.57 kg/m.  General: Cooperative, alert, well developed, in no acute distress. HEENT: Conjunctivae and lids unremarkable. Cardiovascular: Regular rhythm.  Lungs: Normal work of breathing. Neurologic: No focal deficits.   Lab Results  Component Value Date   CREATININE 0.98 03/02/2020   BUN 18 03/02/2020   NA 144 03/02/2020   K 4.6 03/02/2020   CL 103 03/02/2020   CO2 26 03/02/2020   Lab Results  Component Value Date   ALT 24 03/02/2020   AST 24 03/02/2020   ALKPHOS 56 03/02/2020   BILITOT 0.8 03/02/2020   Lab Results  Component Value Date   HGBA1C 5.8 (H) 03/02/2020   HGBA1C 5.6 10/14/2019   HGBA1C 5.8 (H) 12/10/2018   HGBA1C 5.8 (H) 06/06/2018   HGBA1C 5.6 08/14/2017   Lab Results  Component Value Date   INSULIN 8.3 03/02/2020   INSULIN 14.1 10/14/2019  INSULIN 15.4 12/10/2018   INSULIN 9.6 06/06/2018   INSULIN 10.8 08/14/2017   Lab Results  Component Value Date   TSH 4.890 (H) 04/27/2020   Lab Results  Component Value Date   CHOL 131 10/14/2019   HDL 57 10/14/2019   LDLCALC 60 10/14/2019   LDLDIRECT 68.0 04/13/2015   TRIG 68 10/14/2019   CHOLHDL 2.9 09/12/2018   Lab Results  Component Value Date   WBC 3.5 (L) 04/24/2020   HGB 14.4 04/24/2020   HCT 42.4 04/24/2020   MCV 96.2 04/24/2020     PLT 131.0 (L) 04/24/2020   No results found for: IRON, TIBC, FERRITIN  Obesity Behavioral Intervention Documentation for Insurance:   Approximately 15 minutes were spent on the discussion below.  ASK: We discussed the diagnosis of obesity with Philip [name] today and Philip [name] agreed to give [place] permission to discuss obesity behavioral modification therapy today.  ASSESS: Philip [name] has the diagnosis of obesity and his BMI today is 30.55. Philip [name] is in the action stage of change.   ADVISE: Philip [name] was educated on the multiple health risks of obesity as well as the benefit of weight loss to improve his health. He was advised of the need for long term treatment and the importance of lifestyle modifications to improve his current health and to decrease his risk of future health problems.  AGREE: Multiple dietary modification options and treatment options were discussed and Philip [name] agreed to follow the recommendations documented in the above note.  ARRANGE: Philip [name] was educated on the importance of frequent visits to treat obesity as outlined per CMS and USPSTF guidelines and agreed to schedule his next follow up appointment today.  Attestation Statements:   Reviewed by clinician on day of visit: allergies, medications, problem list, medical history, surgical history, family history, social history, and previous encounter notes.   I, Trixie Dredge, am acting as transcriptionist for [name], MD.  I have reviewed the above documentation for accuracy and completeness, and I agree with the above. -  [name], MD

## 2020-05-05 NOTE — Progress Notes (Signed)
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 His dermatologist has removed the area on his left nose multiple times, but the spot continues to recur.   Past/Anticipated interventions by patient's surgeon/dermatologist for current problematic lesion, if any:  Dr. Cindie Crumbly prescribed efudex and imiquimod creams to both areas, but patient did not see an improvement.  03/16/2020   Past skin cancers, if any: Patient states he has dealt with skin cancer for more than 50 years. He is not able to recall exact locations, but states they "have cropped up on both legs and arms" over the years  History of Blistering sunburns, if any: Yes--patient reports he never used sunscreen as a young man.  SAFETY ISSUES:  Prior radiation? No  Pacemaker/ICD? No  Possible current pregnancy? N/A  Is the patient on methotrexate? No  Current Complaints / other details:  Nothing of note

## 2020-05-06 ENCOUNTER — Encounter: Payer: Self-pay | Admitting: Radiation Oncology

## 2020-05-06 ENCOUNTER — Ambulatory Visit
Admission: RE | Admit: 2020-05-06 | Discharge: 2020-05-06 | Disposition: A | Discharge status: Home / Self Care | Payer: Medicare HMO | Source: Ambulatory Visit | Attending: Radiation Oncology | Admitting: Radiation Oncology

## 2020-05-06 ENCOUNTER — Other Ambulatory Visit: Payer: Self-pay

## 2020-05-06 VITALS — BP 149/83 | HR 70 | Temp 98.2°F | Resp 18 | Ht 69.0 in | Wt 212.0 lb

## 2020-05-06 DIAGNOSIS — I4891 Unspecified atrial fibrillation: Secondary | ICD-10-CM | POA: Diagnosis not present

## 2020-05-06 DIAGNOSIS — C44319 Basal cell carcinoma of skin of other parts of face: Secondary | ICD-10-CM

## 2020-05-06 DIAGNOSIS — I509 Heart failure, unspecified: Secondary | ICD-10-CM | POA: Insufficient documentation

## 2020-05-06 DIAGNOSIS — C44311 Basal cell carcinoma of skin of nose: Secondary | ICD-10-CM | POA: Diagnosis not present

## 2020-05-06 DIAGNOSIS — Z7901 Long term (current) use of anticoagulants: Secondary | ICD-10-CM | POA: Diagnosis not present

## 2020-05-06 DIAGNOSIS — I252 Old myocardial infarction: Secondary | ICD-10-CM | POA: Diagnosis not present

## 2020-05-06 DIAGNOSIS — E785 Hyperlipidemia, unspecified: Secondary | ICD-10-CM | POA: Insufficient documentation

## 2020-05-06 DIAGNOSIS — Z809 Family history of malignant neoplasm, unspecified: Secondary | ICD-10-CM | POA: Insufficient documentation

## 2020-05-06 DIAGNOSIS — I714 Abdominal aortic aneurysm, without rupture, unspecified: Secondary | ICD-10-CM

## 2020-05-06 DIAGNOSIS — G4700 Insomnia, unspecified: Secondary | ICD-10-CM | POA: Diagnosis not present

## 2020-05-06 DIAGNOSIS — Z79899 Other long term (current) drug therapy: Secondary | ICD-10-CM | POA: Diagnosis not present

## 2020-05-06 DIAGNOSIS — Z86010 Personal history of colonic polyps: Secondary | ICD-10-CM | POA: Diagnosis not present

## 2020-05-06 DIAGNOSIS — I10 Essential (primary) hypertension: Secondary | ICD-10-CM | POA: Diagnosis not present

## 2020-05-06 DIAGNOSIS — I2510 Atherosclerotic heart disease of native coronary artery without angina pectoris: Secondary | ICD-10-CM | POA: Insufficient documentation

## 2020-05-06 DIAGNOSIS — M545 Low back pain: Secondary | ICD-10-CM | POA: Diagnosis not present

## 2020-05-06 DIAGNOSIS — K219 Gastro-esophageal reflux disease without esophagitis: Secondary | ICD-10-CM | POA: Insufficient documentation

## 2020-05-06 DIAGNOSIS — Z87891 Personal history of nicotine dependence: Secondary | ICD-10-CM | POA: Diagnosis not present

## 2020-05-06 DIAGNOSIS — Z87442 Personal history of urinary calculi: Secondary | ICD-10-CM | POA: Diagnosis not present

## 2020-05-06 NOTE — Progress Notes (Signed)
Oncology Nurse Navigator Documentation  Met with patient during initial consult with Dr. Isidore Moos.     . Further introduced myself as his Navigator, explained my role as a member of the Care Team.   . Provided introductory explanation of radiation treatment including SIM planning and purpose of Aquaplast head and shoulder mask, showed them example.   . Provided a tour of SIM and Tomo areas, explained treatment and arrival procedures. . I encouraged him to contact me with questions/concerns as treatments/procedures begin.  . I have contacted Dr. Pearline Cables office at [facility] for his email address to provide to Dr. Isidore Moos at her request.  He verbalized understanding of information provided.    Harlow Asa, RN, BSN Head & Neck Oncology Nurse [place] [facility] [place] [phone]

## 2020-05-06 NOTE — Progress Notes (Signed)
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 Basal cell carcinoma (BCC) of left temple region  C44.319   2. Basal cell carcinoma (BCC) of left side of nose  C44.311     CHIEF COMPLAINT: Here to discuss management of skin cancer  HISTORY OF PRESENT ILLNESS::Philip [name] is a 79 y.o. male who presented with abnormal skin lesions to his nose and left temple. Shave biopsy of the areas performed on 04/16/2020 showed basal cell carcinoma, nodular type, to both areas.  He states the areas on his left temple and left nose has been present "on and off" for years. His dermatologist has removed the area on his left nose multiple times, but the spot continues to recur.   Past/Anticipated interventions by patient's surgeon/dermatologist for current problematic lesion, if any:  Dr. Cindie Crumbly prescribed efudex and imiquimod creams to both areas, but patient did not see an improvement.   Past skin cancers, if any: Patient states he has dealt with skin cancer for more than 50 years. He is not able to recall exact locations, but states they "have cropped up on both legs and arms" over the years  History of Blistering sunburns, if any: Yes--patient reports he never used sunscreen as a young man.  He was referred by his dermatologist, Dr. Pearline Cables, for consideration of radiation therapy.  The patient states that Dr. Pearline Cables had trouble determining where the left temple lesion is today. He was able to outline the lesion on his nose today, however.   The patient reports he is in his USOH.  He plays golf and lives with his wife in [place].  PREVIOUS RADIATION THERAPY: No  PAST  MEDICAL HISTORY:  has a past medical history of AAA (abdominal aortic aneurysm) ([place]), Atrial fibrillation ([place]), CAD (coronary artery disease), Cataracts, bilateral, CHF (congestive heart failure) ([place]), Dysrhythmia, GERD (gastroesophageal reflux disease), History of colon polyps, History of kidney stones, History of shingles, Hyperlipidemia, Hypertension, Joint pain, Joint swelling, Myocardial infarction ([place]), OSA (obstructive sleep apnea), Peripheral vascular disease ([place]), Pneumonia, Skin cancer, Tingling, and Tinnitus.    PAST SURGICAL HISTORY: Past Surgical History:  Procedure Laterality Date   ABDOMINAL AORTAGRAM N/A 06/16/2014   Procedure: ABDOMINAL Maxcine Ham;  Surgeon: Angelia Mould, MD;  Location: [facility] CATH LAB;  Service: Cardiovascular;  Laterality: N/A;   ABDOMINAL AORTIC ENDOVASCULAR STENT GRAFT N/A 07/08/2014   Procedure: ABDOMINAL AORTIC ENDOVASCULAR STENT GRAFT/ GORE;  Surgeon: Angelia Mould, MD;  Location: [place];  Service: Vascular;  Laterality: N/A;   CARDIAC CATHETERIZATION  2015   COLONOSCOPY     CORONARY ARTERY BYPASS GRAFT  1999   x 3 vessels   LEFT HEART CATHETERIZATION WITH CORONARY/GRAFT ANGIOGRAM N/A 05/29/2014   Procedure: LEFT HEART CATHETERIZATION WITH [name];  Surgeon: [name], MD;  Location: [facility] CATH LAB;  Service: Cardiovascular;  Laterality: N/A;   PENILE PROSTHESIS IMPLANT  08/2005   RHINOPLASTY  1975    FAMILY HISTORY: family history includes Cancer in his father, mother, and sister; Colon cancer in an other family member; Deep vein thrombosis in his father; Heart attack in his father and mother;  Heart disease in his father, mother, and sister; Hyperlipidemia in his brother, father, and mother; Hypertension in his brother, father, and mother; Peripheral vascular disease in his mother; Prostate cancer (age of onset: 22) in his father; Prostate cancer (age of onset: 20) in his brother; Skin cancer in his brother, father,  mother, and sister; Sleep apnea in his brother; Varicose Veins in his mother.  SOCIAL HISTORY:  reports that he quit smoking about 42 years ago. His smoking use included cigarettes. He has never used smokeless tobacco. He reports current alcohol use. He reports that he does not use drugs.  ALLERGIES: Patient has no known allergies.  MEDICATIONS:  Current Outpatient Medications  Medication Sig Dispense Refill   amLODipine (NORVASC) 10 MG tablet Take 1 tablet (10 mg total) by mouth daily. 90 tablet 0   atorvastatin (LIPITOR) 40 MG tablet TAKE 1 TABLET (40 MG TOTAL) BY MOUTH AT BEDTIME. 90 tablet 2   B Complex-Biotin-FA (B-COMPLEX PO) Take 1 tablet by mouth daily.     Cholecalciferol (VITAMIN D3) 5000 UNITS CAPS Take 5,000 Units by mouth daily.     co-enzyme Q-10 30 MG capsule Take 30 mg by mouth daily.     ELIQUIS 5 MG TABS tablet TAKE 1 TABLET TWICE DAILY 180 tablet 2   hydrocortisone 2.5 % cream Apply 1 application topically as needed.     levothyroxine (SYNTHROID) 25 MCG tablet Take 1 tablet (25 mcg total) by mouth daily before breakfast. 30 tablet 1   lisinopril (ZESTRIL) 20 MG tablet Take 1 tablet (20 mg total) by mouth daily. 90 tablet 3   magnesium oxide (MAG-OX) 400 MG tablet Take 800 mg by mouth daily.     Multiple Vitamins-Minerals (MULTIVITAMIN WITH MINERALS) tablet Take 1 tablet by mouth daily.     Omega-3 Fatty Acids (OMEGA-3 FISH OIL) 1200 MG CAPS Take 2 1,200 mg capsules daily 90 capsule 3   polyethylene glycol (MIRALAX / GLYCOLAX) packet Take 17 g by mouth daily.     No current facility-administered medications for this encounter.    REVIEW OF SYSTEMS:  Notable for that above.   PHYSICAL EXAM:  height is 5\' 9"  (1.753 m) and weight is 212 lb (96.2 kg). His oral temperature is 98.2 F (36.8 C). His blood pressure is 149/83 (abnormal) and his pulse is 70. His respiration is 18 and oxygen saturation is 100%.   See photo below.   Skin notable for chronic sun  changes. Lesion on nose is marked with pen/delineated, and I am unable to discern where the lesion is on his left temple. Neck: no adenopathy; prominent submandibular glands bilaterally. Lymph: no palpable adenopathy over face or neck  ECOG = 0  0 - Asymptomatic (Fully active, able to carry on all predisease activities without restriction)  1 - Symptomatic but completely ambulatory (Restricted in physically strenuous activity but ambulatory and able to carry out work of a light or sedentary nature. For example, light housework, office work)  2 - Symptomatic, <50% in bed during the day (Ambulatory and capable of all self care but unable to carry out any work activities. Up and about more than 50% of waking hours)  3 - Symptomatic, >50% in bed, but not bedbound (Capable of only limited self-care, confined to bed or chair 50% or more of waking hours)  4 - Bedbound (Completely disabled. Cannot carry on any self-care. Totally confined to bed or chair)  5 - Death   Eustace Pen MM, Creech RH, Tormey DC, et al. (  1982). "Toxicity and response criteria of the [place] [place] [employer]". [place] Oncol. 5 (6): 649-55   LABORATORY DATA:  Lab Results  Component Value Date   WBC 3.5 (L) 04/24/2020   HGB 14.4 04/24/2020   HCT 42.4 04/24/2020   MCV 96.2 04/24/2020   PLT 131.0 (L) 04/24/2020   CMP     Component Value Date/Time   NA 144 03/02/2020 1250   K 4.6 03/02/2020 1250   CL 103 03/02/2020 1250   CO2 26 03/02/2020 1250   GLUCOSE 94 03/02/2020 1250   GLUCOSE 81 04/26/2019 1341   BUN 18 03/02/2020 1250   CREATININE 0.98 03/02/2020 1250   CREATININE 1.08 12/14/2015 1122   CALCIUM 10.1 03/02/2020 1250   PROT 6.6 03/02/2020 1250   ALBUMIN 4.6 03/02/2020 1250   AST 24 03/02/2020 1250   ALT 24 03/02/2020 1250   ALKPHOS 56 03/02/2020 1250   BILITOT 0.8 03/02/2020 1250   GFRNONAA 73 03/02/2020 1250   GFRAA 84 03/02/2020 1250        RADIOGRAPHY: No results found.     IMPRESSION/PLAN: This is a wonderful patient with Skin Cancer  Today, I talked to the patient about the findings and work-up thus far. We discussed the patient's diagnosis of skin and general treatment for this, highlighting the role of radiotherapy in the management. We discussed the available radiationStechniques, and focused on the details of logistics and delivery. He is interested in alternatives to surgery.    We will try to determine is the left temple lesion is something that Dr. Pearline Cables plans to follow or it he would like for this to be targeted - if we treat with RT, we will need him to outline it in addition to the nose lesion, right before RT planning.  We discussed the risks, benefits, and side effects of radiotherapy. Side effects may include but not necessarily be limited to: skin irritation, fatigue, rare cartilage injury, telangiectasias. We discussed a 4 week hypofractionated vs 6 wk regimen (he would prefer 4 weeks as this will be much more convenient and I think this is a fine approach).  No guarantees of treatment were given. A consent form was signed and placed in the patient's medical record.  The patient was encouraged to ask questions that I answered to the best of my ability.   We'll arrange treatment planning after touching base with Dr. Pearline Cables.  We discussed measures to reduce the risk of infection during the COVID-19 pandemic.  He is vaccinated.  On date of service, in total, I spent 50 minutes on this encounter. Patient was seen in person.     __________________________________________   Eppie Gibson, MD  This document serves as a record of services personally performed by Eppie Gibson, MD. It was created on her behalf by Wilburn Mylar, a trained medical scribe. The creation of this record is based on the scribe's personal observations and the provider's statements to them. This document has been checked and approved by the attending provider.

## 2020-05-10 ENCOUNTER — Encounter: Payer: Self-pay | Admitting: Radiation Oncology

## 2020-05-13 ENCOUNTER — Telehealth (INDEPENDENT_AMBULATORY_CARE_PROVIDER_SITE_OTHER): Payer: Self-pay | Admitting: Family Medicine

## 2020-05-13 NOTE — Telephone Encounter (Signed)
Needs a refill of euthyrox 25mg ,

## 2020-05-18 ENCOUNTER — Other Ambulatory Visit: Payer: Self-pay

## 2020-05-18 ENCOUNTER — Encounter (INDEPENDENT_AMBULATORY_CARE_PROVIDER_SITE_OTHER): Payer: Self-pay | Admitting: Family Medicine

## 2020-05-18 ENCOUNTER — Ambulatory Visit (INDEPENDENT_AMBULATORY_CARE_PROVIDER_SITE_OTHER): Payer: Medicare HMO | Admitting: Family Medicine

## 2020-05-18 VITALS — BP 133/69 | HR 72 | Temp 97.7°F | Ht 69.0 in | Wt 206.0 lb

## 2020-05-18 DIAGNOSIS — Z6830 Body mass index (BMI) 30.0-30.9, adult: Secondary | ICD-10-CM

## 2020-05-18 DIAGNOSIS — E669 Obesity, unspecified: Secondary | ICD-10-CM | POA: Diagnosis not present

## 2020-05-18 DIAGNOSIS — E038 Other specified hypothyroidism: Secondary | ICD-10-CM

## 2020-05-18 MED ORDER — LEVOTHYROXINE SODIUM 25 MCG PO TABS: 25.0000 ug | ORAL_TABLET | Freq: Every day | ORAL | 1 refills | Status: DC

## 2020-05-19 NOTE — Progress Notes (Signed)
Oncology Nurse Navigator Documentation  I called and scheduled an appointment for Philip [name] with Dr. Cindie Crumbly (Dermatologist) on 05/25/20 at 2 pm to have Dr. Pearline Cables mark the areas planned for radiation. I then arranged an appointment for CT simulation the next day, 05/26/20 at 10:30 to plan his radiation. I have made Mr. Eden aware of these appointments and he is agreeable to the dates and times. He knows to call me back if he has any needs or concerns.   Harlow Asa RN, BSN, OCN Head & Neck Oncology Nurse [place] at [facility] Phone # [phone]  Fax # [phone]

## 2020-05-20 NOTE — Progress Notes (Signed)
Chief Complaint:   OBESITY Kodi is here to discuss his progress with his obesity treatment plan along with follow-up of his obesity related diagnoses. Deantae is on following a lower carbohydrate, vegetable and lean protein rich diet plan and states he is following his eating plan approximately 50-60% of the time. Stoney states he is at the gym, playing golf, and doing push ups for 30-90 minutes 3-6 times per week.  Today's visit was #: 59 Starting weight: 230 lbs Starting date: 01/18/2017 Today's weight: 206 lbs Today's date: 05/18/2020 Total lbs lost to date: 24 Total lbs lost since last in-office visit: 1  Interim History: Dillyn continues to do well with healthy eating. He continues to exercise regularly and his hunger is well controlled. He has increased his water especially when outdoors.  Subjective:   1. Other specified hypothyroidism Avelino is with elevated TSh, and his last 2 TSH readings have been mildly elevated, and T3 and T4 are within normal limits. She denies palpitations or weight gain. His energy is good.  Assessment/Plan:   1. Other specified hypothyroidism Patient with long-standing hypothyroidism, on levothyroxine therapy. Elvan appears euthyroid. We will refill Synthroid at 25 mcg for 2 months. Orders and follow up as documented in patient record.  - levothyroxine (SYNTHROID) 25 MCG tablet; Take 1 tablet (25 mcg total) by mouth daily before breakfast.  Dispense: 30 tablet; Refill: 1  2. Class 1 obesity with serious comorbidity and body mass index (BMI) of 30.0 to 30.9 in adult, unspecified obesity type Duy is currently in the action stage of change. As such, his goal is to continue with weight loss efforts. He has agreed to following a lower carbohydrate, vegetable and lean protein rich diet plan.   Exercise goals: As is.  Behavioral modification strategies: increasing water intake.  Daishon has agreed to follow-up with our clinic in 8 weeks. He was  informed of the importance of frequent follow-up visits to maximize his success with intensive lifestyle modifications for his multiple health conditions.   Objective:   Blood pressure 133/69, pulse 72, temperature 97.7 F (36.5 C), temperature source Oral, height 5\' 9"  (1.753 m), weight 206 lb (93.4 kg), SpO2 96 %. Body mass index is 30.42 kg/m.  General: Cooperative, alert, well developed, in no acute distress. HEENT: Conjunctivae and lids unremarkable. Cardiovascular: Regular rhythm.  Lungs: Normal work of breathing. Neurologic: No focal deficits.   Lab Results  Component Value Date   CREATININE 0.98 03/02/2020   BUN 18 03/02/2020   NA 144 03/02/2020   K 4.6 03/02/2020   CL 103 03/02/2020   CO2 26 03/02/2020   Lab Results  Component Value Date   ALT 24 03/02/2020   AST 24 03/02/2020   ALKPHOS 56 03/02/2020   BILITOT 0.8 03/02/2020   Lab Results  Component Value Date   HGBA1C 5.8 (H) 03/02/2020   HGBA1C 5.6 10/14/2019   HGBA1C 5.8 (H) 12/10/2018   HGBA1C 5.8 (H) 06/06/2018   HGBA1C 5.6 08/14/2017   Lab Results  Component Value Date   INSULIN 8.3 03/02/2020   INSULIN 14.1 10/14/2019   INSULIN 15.4 12/10/2018   INSULIN 9.6 06/06/2018   INSULIN 10.8 08/14/2017   Lab Results  Component Value Date   TSH 4.890 (H) 04/27/2020   Lab Results  Component Value Date   CHOL 131 10/14/2019   HDL 57 10/14/2019   LDLCALC 60 10/14/2019   LDLDIRECT 68.0 04/13/2015   TRIG 68 10/14/2019   CHOLHDL 2.9 09/12/2018  Lab Results  Component Value Date   WBC 3.5 (L) 04/24/2020   HGB 14.4 04/24/2020   HCT 42.4 04/24/2020   MCV 96.2 04/24/2020   PLT 131.0 (L) 04/24/2020   No results found for: IRON, TIBC, FERRITIN  Obesity Behavioral Intervention Documentation for Insurance:   Approximately 15 minutes were spent on the discussion below.  ASK: We discussed the diagnosis of obesity with [name] today and Pantelis agreed to give [place] permission to discuss obesity behavioral  modification therapy today.  ASSESS: Bodey has the diagnosis of obesity and his BMI today is 30.41. Treven is in the action stage of change.   ADVISE: Arty was educated on the multiple health risks of obesity as well as the benefit of weight loss to improve his health. He was advised of the need for long term treatment and the importance of lifestyle modifications to improve his current health and to decrease his risk of future health problems.  AGREE: Multiple dietary modification options and treatment options were discussed and Sonya agreed to follow the recommendations documented in the above note.  ARRANGE: [place] was educated on the importance of frequent visits to treat obesity as outlined per CMS and USPSTF guidelines and agreed to schedule his next follow up appointment today.  Attestation Statements:   Reviewed by clinician on day of visit: allergies, medications, problem list, medical history, surgical history, family history, social history, and previous encounter notes.   I, Trixie Dredge, am acting as transcriptionist for [name], MD.  I have reviewed the above documentation for accuracy and completeness, and I agree with the above. -  [name], MD

## 2020-05-22 ENCOUNTER — Ambulatory Visit
Admission: RE | Admit: 2020-05-22 | Discharge: 2020-05-22 | Disposition: A | Discharge status: Home / Self Care | Payer: Medicare HMO | Source: Ambulatory Visit | Attending: Vascular Surgery | Admitting: Vascular Surgery

## 2020-05-22 ENCOUNTER — Other Ambulatory Visit: Payer: Self-pay | Admitting: Interventional Cardiology

## 2020-05-22 DIAGNOSIS — I714 Abdominal aortic aneurysm, without rupture, unspecified: Secondary | ICD-10-CM

## 2020-05-22 DIAGNOSIS — I723 Aneurysm of iliac artery: Secondary | ICD-10-CM | POA: Diagnosis not present

## 2020-05-22 DIAGNOSIS — N281 Cyst of kidney, acquired: Secondary | ICD-10-CM | POA: Diagnosis not present

## 2020-05-22 DIAGNOSIS — R5383 Other fatigue: Secondary | ICD-10-CM

## 2020-05-22 DIAGNOSIS — R0609 Other forms of dyspnea: Secondary | ICD-10-CM

## 2020-05-22 DIAGNOSIS — I2510 Atherosclerotic heart disease of native coronary artery without angina pectoris: Secondary | ICD-10-CM

## 2020-05-22 DIAGNOSIS — D3500 Benign neoplasm of unspecified adrenal gland: Secondary | ICD-10-CM | POA: Diagnosis not present

## 2020-05-22 MED ORDER — IOPAMIDOL (ISOVUE-370) INJECTION 76%
75.0000 mL | Freq: Once | INTRAVENOUS | Status: AC | PRN
  Administered 2020-05-22: 75 mL via INTRAVENOUS

## 2020-05-26 ENCOUNTER — Ambulatory Visit
Admission: RE | Admit: 2020-05-26 | Discharge: 2020-05-26 | Disposition: A | Discharge status: Home / Self Care | Payer: Medicare HMO | Source: Ambulatory Visit | Attending: Radiation Oncology | Admitting: Radiation Oncology

## 2020-05-26 ENCOUNTER — Other Ambulatory Visit: Payer: Self-pay

## 2020-05-26 DIAGNOSIS — C44319 Basal cell carcinoma of skin of other parts of face: Secondary | ICD-10-CM | POA: Diagnosis not present

## 2020-05-26 DIAGNOSIS — C44311 Basal cell carcinoma of skin of nose: Secondary | ICD-10-CM | POA: Insufficient documentation

## 2020-05-27 ENCOUNTER — Encounter: Payer: Self-pay | Admitting: Vascular Surgery

## 2020-05-27 ENCOUNTER — Ambulatory Visit (INDEPENDENT_AMBULATORY_CARE_PROVIDER_SITE_OTHER): Payer: Medicare HMO | Admitting: Vascular Surgery

## 2020-05-27 VITALS — BP 139/69 | Ht 69.0 in | Wt 210.0 lb

## 2020-05-27 DIAGNOSIS — I714 Abdominal aortic aneurysm, without rupture, unspecified: Secondary | ICD-10-CM

## 2020-05-27 DIAGNOSIS — C44319 Basal cell carcinoma of skin of other parts of face: Secondary | ICD-10-CM | POA: Diagnosis not present

## 2020-05-27 DIAGNOSIS — C44311 Basal cell carcinoma of skin of nose: Secondary | ICD-10-CM | POA: Diagnosis not present

## 2020-05-27 DIAGNOSIS — Z95828 Presence of other vascular implants and grafts: Secondary | ICD-10-CM | POA: Diagnosis not present

## 2020-05-27 NOTE — Progress Notes (Addendum)
Patient name: Philip [name] DOB: 12-25-1940 Sex: male    Referring Provider is Colon Branch, MD  PCP is Colon Branch, MD  REASON FOR VIRTUAL VISIT: Follow-up of abdominal aortic aneurysm.  Status post endovascular aneurysm repair.  I connected with Philip [name] on 05/27/20 at  9:00 AM EDT by a telephone enabled telemedicine application and verified that I am speaking with the correct person using two identifiers. I discussed the limitations of evaluation and management by telemedicine and the availability of in person appointments. The patient expressed understanding and agreed to proceed.  This was a telephone visit.  Location: Patient: Home Provider: Office  HPI: Philip [name] is a 79 y.o. 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  At that time the right hypogastric artery was 1.4 cm in maximum diameter.  The left hypogastric was 2.2 cm in maximum diameter.  I did a virtual visit with him on 05/22/2019.  This showed that the aneurysm was stable in size and the graft was in good position.  The left hypogastric was 2.3 cm in maximum diameter.  I felt this would be hard to follow with ultrasound and therefore we set up for a 1 year follow-up CT scan.  He also had small adrenal adenomas which were stable in size.  Today his only complaint is that when he is doing crunches at the gym he notices some bulging in his left upper abdomen just below his rib cage which sounds potentially like a hernia.  It is not symptomatic.  Suggest something he notices.  He denies any abdominal pain or back pain.  He denies any claudication or rest pain.  He is on Eliquis and for this reason he is not on aspirin.  He is on a statin.  Current Outpatient Medications  Medication Sig Dispense Refill  . amLODipine (NORVASC) 10 MG tablet Take 1 tablet (10 mg total) by mouth daily. 90 tablet 0  . atorvastatin (LIPITOR) 40 MG tablet TAKE 1 TABLET (40 MG TOTAL) BY MOUTH AT  BEDTIME. 90 tablet 2  . B Complex-Biotin-FA (B-COMPLEX PO) Take 1 tablet by mouth daily.    . Cholecalciferol (VITAMIN D3) 5000 UNITS CAPS Take 5,000 Units by mouth daily.    Marland Kitchen co-enzyme Q-10 30 MG capsule Take 30 mg by mouth daily.    Marland Kitchen ELIQUIS 5 MG TABS tablet TAKE 1 TABLET TWICE DAILY 180 tablet 2  . hydrocortisone 2.5 % cream Apply 1 application topically as needed.    Marland Kitchen levothyroxine (SYNTHROID) 25 MCG tablet Take 1 tablet (25 mcg total) by mouth daily before breakfast. 30 tablet 1  . lisinopril (ZESTRIL) 20 MG tablet Take 1 tablet (20 mg total) by mouth daily. 90 tablet 3  . magnesium oxide (MAG-OX) 400 MG tablet Take 800 mg by mouth daily.    . Multiple Vitamins-Minerals (MULTIVITAMIN WITH MINERALS) tablet Take 1 tablet by mouth daily.    . Omega-3 Fatty Acids (OMEGA-3 FISH OIL) 1200 MG CAPS Take 2 1,200 mg capsules daily 90 capsule 3  . polyethylene glycol (MIRALAX / GLYCOLAX) packet Take 17 g by mouth daily.     No current facility-administered medications for this visit.   REVIEW OF SYSTEMS: Valu.Nieves ] denotes positive finding; [  ] denotes negative finding  CARDIOVASCULAR:  [ ]  chest pain   [ ]  dyspnea on exertion  [ ]  leg swelling  CONSTITUTIONAL:  [ ]  fever   [ ]   chills   OBSERVATIONS/OBJECTIVE: Vitals:   05/27/20 0922  BP: (!) 139/69  Weight: (!) 95.3 kg  Height: 5\' 9"  (1.753 m)   On the phone the patient is alert and oriented.  He is not short of breath.  DATA:  I have reviewed the images of the CT scan that was done on 05/21/2020.  The aneurysm is stable in size at 6.8 cm.  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  His most recent CT scan this month shows no evidence of enlargement of the aneurysm and no evidence of  endoleak.  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  Given that we cannot follow this with ultrasound I have recommended a follow-up CT scan of the abdomen and pelvis in 1 year and I will see him back at that time.  I encouraged him to stay as active as possible.  If the area of concern on his abdomen progresses I encouraged him to follow-up with his primary care physician to see if he needs to be evaluated for hernia.   FOLLOW UP INSTRUCTIONS:   I discussed the assessment and treatment plan with the patient. The patient was provided an opportunity to ask questions and all were answered. The patient agreed with the plan and demonstrated an understanding of the instructions. The patient was advised to call back or seek an in-person evaluation if the symptoms worsen or if the condition fails to improve as anticipated.  I provided 15 minutes of non-face-to-face time during this encounter.    Deitra Mayo [facility]

## 2020-06-02 ENCOUNTER — Other Ambulatory Visit: Payer: Self-pay

## 2020-06-02 ENCOUNTER — Ambulatory Visit
Admission: RE | Admit: 2020-06-02 | Discharge: 2020-06-02 | Disposition: A | Discharge status: Home / Self Care | Payer: Medicare HMO | Source: Ambulatory Visit | Attending: Radiation Oncology | Admitting: Radiation Oncology

## 2020-06-02 DIAGNOSIS — C44319 Basal cell carcinoma of skin of other parts of face: Secondary | ICD-10-CM | POA: Diagnosis not present

## 2020-06-02 DIAGNOSIS — C44311 Basal cell carcinoma of skin of nose: Secondary | ICD-10-CM | POA: Diagnosis not present

## 2020-06-03 ENCOUNTER — Ambulatory Visit
Admission: RE | Admit: 2020-06-03 | Discharge: 2020-06-03 | Disposition: A | Discharge status: Home / Self Care | Payer: Medicare HMO | Source: Ambulatory Visit | Attending: Radiation Oncology | Admitting: Radiation Oncology

## 2020-06-03 ENCOUNTER — Other Ambulatory Visit: Payer: Self-pay

## 2020-06-03 DIAGNOSIS — C44311 Basal cell carcinoma of skin of nose: Secondary | ICD-10-CM | POA: Diagnosis not present

## 2020-06-03 DIAGNOSIS — C44319 Basal cell carcinoma of skin of other parts of face: Secondary | ICD-10-CM | POA: Diagnosis not present

## 2020-06-04 ENCOUNTER — Ambulatory Visit
Admission: RE | Admit: 2020-06-04 | Discharge: 2020-06-04 | Disposition: A | Discharge status: Home / Self Care | Payer: Medicare HMO | Source: Ambulatory Visit | Attending: Radiation Oncology | Admitting: Radiation Oncology

## 2020-06-04 ENCOUNTER — Other Ambulatory Visit: Payer: Self-pay

## 2020-06-04 DIAGNOSIS — C44311 Basal cell carcinoma of skin of nose: Secondary | ICD-10-CM | POA: Diagnosis not present

## 2020-06-04 DIAGNOSIS — C44319 Basal cell carcinoma of skin of other parts of face: Secondary | ICD-10-CM | POA: Diagnosis not present

## 2020-06-05 ENCOUNTER — Ambulatory Visit
Admission: RE | Admit: 2020-06-05 | Discharge: 2020-06-05 | Disposition: A | Discharge status: Home / Self Care | Payer: Medicare HMO | Source: Ambulatory Visit | Attending: Radiation Oncology | Admitting: Radiation Oncology

## 2020-06-05 ENCOUNTER — Other Ambulatory Visit: Payer: Self-pay

## 2020-06-05 DIAGNOSIS — C44319 Basal cell carcinoma of skin of other parts of face: Secondary | ICD-10-CM | POA: Diagnosis not present

## 2020-06-05 DIAGNOSIS — C44311 Basal cell carcinoma of skin of nose: Secondary | ICD-10-CM | POA: Diagnosis not present

## 2020-06-08 ENCOUNTER — Ambulatory Visit
Admission: RE | Admit: 2020-06-08 | Discharge: 2020-06-08 | Disposition: A | Discharge status: Home / Self Care | Payer: Medicare HMO | Source: Ambulatory Visit | Attending: Radiation Oncology | Admitting: Radiation Oncology

## 2020-06-08 DIAGNOSIS — C44311 Basal cell carcinoma of skin of nose: Secondary | ICD-10-CM | POA: Diagnosis not present

## 2020-06-08 DIAGNOSIS — C44319 Basal cell carcinoma of skin of other parts of face: Secondary | ICD-10-CM | POA: Diagnosis not present

## 2020-06-09 ENCOUNTER — Ambulatory Visit
Admission: RE | Admit: 2020-06-09 | Discharge: 2020-06-09 | Disposition: A | Discharge status: Home / Self Care | Payer: Medicare HMO | Source: Ambulatory Visit | Attending: Radiation Oncology | Admitting: Radiation Oncology

## 2020-06-09 ENCOUNTER — Other Ambulatory Visit: Payer: Self-pay

## 2020-06-09 DIAGNOSIS — C44311 Basal cell carcinoma of skin of nose: Secondary | ICD-10-CM | POA: Diagnosis not present

## 2020-06-09 DIAGNOSIS — C44319 Basal cell carcinoma of skin of other parts of face: Secondary | ICD-10-CM | POA: Diagnosis not present

## 2020-06-10 ENCOUNTER — Ambulatory Visit
Admission: RE | Admit: 2020-06-10 | Discharge: 2020-06-10 | Disposition: A | Discharge status: Home / Self Care | Payer: Medicare HMO | Source: Ambulatory Visit | Attending: Radiation Oncology | Admitting: Radiation Oncology

## 2020-06-10 ENCOUNTER — Other Ambulatory Visit: Payer: Self-pay

## 2020-06-10 DIAGNOSIS — C44319 Basal cell carcinoma of skin of other parts of face: Secondary | ICD-10-CM

## 2020-06-10 DIAGNOSIS — C44311 Basal cell carcinoma of skin of nose: Secondary | ICD-10-CM

## 2020-06-10 MED ORDER — SONAFINE EX EMUL
1.0000 "application " | Freq: Two times a day (BID) | CUTANEOUS | Status: DC
  Administered 2020-06-10: 1 via TOPICAL

## 2020-06-10 NOTE — Progress Notes (Signed)
Pt here for patient teaching.    Pt given Radiation and You booklet, Managing Acute Radiation Side Effects for Head and Neck Cancer handout, skin care instructions and Sonafine.    Reviewed areas of pertinence such as fatigue, hair loss, mouth changes, skin changes, earaches and taste changes .   Pt able to give teach back of to pat skin, use unscented/gentle soap and drink plenty of water,apply Sonafine bid, avoid applying anything to skin within 4 hours of treatment and to use an electric razor if they must shave.   Pt demonstrated understanding and verbalizes understanding of information given and will contact nursing with any questions or concerns.    [email or web address]/[email or web address]

## 2020-06-11 ENCOUNTER — Other Ambulatory Visit: Payer: Self-pay

## 2020-06-11 ENCOUNTER — Ambulatory Visit
Admission: RE | Admit: 2020-06-11 | Discharge: 2020-06-11 | Disposition: A | Discharge status: Home / Self Care | Payer: Medicare HMO | Source: Ambulatory Visit | Attending: Radiation Oncology | Admitting: Radiation Oncology

## 2020-06-11 DIAGNOSIS — C44311 Basal cell carcinoma of skin of nose: Secondary | ICD-10-CM | POA: Diagnosis not present

## 2020-06-11 DIAGNOSIS — C44319 Basal cell carcinoma of skin of other parts of face: Secondary | ICD-10-CM | POA: Diagnosis not present

## 2020-06-12 ENCOUNTER — Other Ambulatory Visit: Payer: Self-pay

## 2020-06-12 ENCOUNTER — Ambulatory Visit
Admission: RE | Admit: 2020-06-12 | Discharge: 2020-06-12 | Disposition: A | Discharge status: Home / Self Care | Payer: Medicare HMO | Source: Ambulatory Visit | Attending: Radiation Oncology | Admitting: Radiation Oncology

## 2020-06-12 DIAGNOSIS — C44311 Basal cell carcinoma of skin of nose: Secondary | ICD-10-CM | POA: Diagnosis not present

## 2020-06-12 DIAGNOSIS — C44319 Basal cell carcinoma of skin of other parts of face: Secondary | ICD-10-CM | POA: Diagnosis not present

## 2020-06-12 DIAGNOSIS — M545 Low back pain: Secondary | ICD-10-CM | POA: Diagnosis not present

## 2020-06-15 ENCOUNTER — Ambulatory Visit
Admission: RE | Admit: 2020-06-15 | Discharge: 2020-06-15 | Disposition: A | Discharge status: Home / Self Care | Payer: Medicare HMO | Source: Ambulatory Visit | Attending: Radiation Oncology | Admitting: Radiation Oncology

## 2020-06-15 DIAGNOSIS — C44311 Basal cell carcinoma of skin of nose: Secondary | ICD-10-CM | POA: Diagnosis not present

## 2020-06-15 DIAGNOSIS — C44319 Basal cell carcinoma of skin of other parts of face: Secondary | ICD-10-CM | POA: Diagnosis not present

## 2020-06-16 ENCOUNTER — Ambulatory Visit
Admission: RE | Admit: 2020-06-16 | Discharge: 2020-06-16 | Disposition: A | Discharge status: Home / Self Care | Payer: Medicare HMO | Source: Ambulatory Visit | Attending: Radiation Oncology | Admitting: Radiation Oncology

## 2020-06-16 ENCOUNTER — Other Ambulatory Visit: Payer: Self-pay

## 2020-06-16 DIAGNOSIS — C44311 Basal cell carcinoma of skin of nose: Secondary | ICD-10-CM | POA: Diagnosis not present

## 2020-06-16 DIAGNOSIS — C44319 Basal cell carcinoma of skin of other parts of face: Secondary | ICD-10-CM | POA: Diagnosis not present

## 2020-06-16 DIAGNOSIS — M25562 Pain in left knee: Secondary | ICD-10-CM | POA: Diagnosis not present

## 2020-06-17 ENCOUNTER — Other Ambulatory Visit: Payer: Self-pay

## 2020-06-17 ENCOUNTER — Ambulatory Visit
Admission: RE | Admit: 2020-06-17 | Discharge: 2020-06-17 | Disposition: A | Discharge status: Home / Self Care | Payer: Medicare HMO | Source: Ambulatory Visit | Attending: Radiation Oncology | Admitting: Radiation Oncology

## 2020-06-17 DIAGNOSIS — M25562 Pain in left knee: Secondary | ICD-10-CM | POA: Diagnosis not present

## 2020-06-17 DIAGNOSIS — C44319 Basal cell carcinoma of skin of other parts of face: Secondary | ICD-10-CM | POA: Diagnosis not present

## 2020-06-17 DIAGNOSIS — C44311 Basal cell carcinoma of skin of nose: Secondary | ICD-10-CM | POA: Diagnosis not present

## 2020-06-18 ENCOUNTER — Other Ambulatory Visit: Payer: Self-pay

## 2020-06-18 ENCOUNTER — Ambulatory Visit
Admission: RE | Admit: 2020-06-18 | Discharge: 2020-06-18 | Disposition: A | Discharge status: Home / Self Care | Payer: Medicare HMO | Source: Ambulatory Visit | Attending: Radiation Oncology | Admitting: Radiation Oncology

## 2020-06-18 DIAGNOSIS — C44311 Basal cell carcinoma of skin of nose: Secondary | ICD-10-CM | POA: Diagnosis not present

## 2020-06-18 DIAGNOSIS — C44319 Basal cell carcinoma of skin of other parts of face: Secondary | ICD-10-CM | POA: Diagnosis not present

## 2020-06-19 ENCOUNTER — Ambulatory Visit
Admission: RE | Admit: 2020-06-19 | Discharge: 2020-06-19 | Disposition: A | Discharge status: Home / Self Care | Payer: Medicare HMO | Source: Ambulatory Visit | Attending: Radiation Oncology | Admitting: Radiation Oncology

## 2020-06-19 ENCOUNTER — Other Ambulatory Visit: Payer: Self-pay

## 2020-06-19 DIAGNOSIS — C44311 Basal cell carcinoma of skin of nose: Secondary | ICD-10-CM | POA: Diagnosis not present

## 2020-06-19 DIAGNOSIS — C44319 Basal cell carcinoma of skin of other parts of face: Secondary | ICD-10-CM | POA: Diagnosis not present

## 2020-06-22 ENCOUNTER — Other Ambulatory Visit: Payer: Self-pay

## 2020-06-22 ENCOUNTER — Ambulatory Visit
Admission: RE | Admit: 2020-06-22 | Discharge: 2020-06-22 | Disposition: A | Discharge status: Home / Self Care | Payer: Medicare HMO | Source: Ambulatory Visit | Attending: Radiation Oncology | Admitting: Radiation Oncology

## 2020-06-22 DIAGNOSIS — C44311 Basal cell carcinoma of skin of nose: Secondary | ICD-10-CM | POA: Diagnosis not present

## 2020-06-22 DIAGNOSIS — C44319 Basal cell carcinoma of skin of other parts of face: Secondary | ICD-10-CM | POA: Diagnosis not present

## 2020-06-23 ENCOUNTER — Ambulatory Visit
Admission: RE | Admit: 2020-06-23 | Discharge: 2020-06-23 | Disposition: A | Discharge status: Home / Self Care | Payer: Medicare HMO | Source: Ambulatory Visit | Attending: Radiation Oncology | Admitting: Radiation Oncology

## 2020-06-23 ENCOUNTER — Other Ambulatory Visit: Payer: Self-pay

## 2020-06-23 DIAGNOSIS — C44319 Basal cell carcinoma of skin of other parts of face: Secondary | ICD-10-CM | POA: Diagnosis not present

## 2020-06-23 DIAGNOSIS — C44311 Basal cell carcinoma of skin of nose: Secondary | ICD-10-CM | POA: Diagnosis not present

## 2020-06-24 ENCOUNTER — Ambulatory Visit
Admission: RE | Admit: 2020-06-24 | Discharge: 2020-06-24 | Disposition: A | Discharge status: Home / Self Care | Payer: Medicare HMO | Source: Ambulatory Visit | Attending: Radiation Oncology | Admitting: Radiation Oncology

## 2020-06-24 DIAGNOSIS — C44319 Basal cell carcinoma of skin of other parts of face: Secondary | ICD-10-CM | POA: Diagnosis not present

## 2020-06-24 DIAGNOSIS — C44311 Basal cell carcinoma of skin of nose: Secondary | ICD-10-CM | POA: Diagnosis not present

## 2020-06-25 ENCOUNTER — Other Ambulatory Visit: Payer: Self-pay

## 2020-06-25 ENCOUNTER — Ambulatory Visit
Admission: RE | Admit: 2020-06-25 | Discharge: 2020-06-25 | Disposition: A | Discharge status: Home / Self Care | Payer: Medicare HMO | Source: Ambulatory Visit | Attending: Radiation Oncology | Admitting: Radiation Oncology

## 2020-06-25 DIAGNOSIS — C44319 Basal cell carcinoma of skin of other parts of face: Secondary | ICD-10-CM | POA: Diagnosis not present

## 2020-06-25 DIAGNOSIS — C44311 Basal cell carcinoma of skin of nose: Secondary | ICD-10-CM | POA: Diagnosis not present

## 2020-06-26 ENCOUNTER — Ambulatory Visit
Admission: RE | Admit: 2020-06-26 | Discharge: 2020-06-26 | Disposition: A | Discharge status: Home / Self Care | Payer: Medicare HMO | Source: Ambulatory Visit | Attending: Radiation Oncology | Admitting: Radiation Oncology

## 2020-06-26 DIAGNOSIS — C44311 Basal cell carcinoma of skin of nose: Secondary | ICD-10-CM | POA: Diagnosis not present

## 2020-06-26 DIAGNOSIS — C44319 Basal cell carcinoma of skin of other parts of face: Secondary | ICD-10-CM | POA: Diagnosis not present

## 2020-06-29 ENCOUNTER — Encounter: Payer: Self-pay | Admitting: Radiation Oncology

## 2020-06-29 ENCOUNTER — Ambulatory Visit
Admission: RE | Admit: 2020-06-29 | Discharge: 2020-06-29 | Disposition: A | Discharge status: Home / Self Care | Payer: Medicare HMO | Source: Ambulatory Visit | Attending: Radiation Oncology | Admitting: Radiation Oncology

## 2020-06-29 ENCOUNTER — Other Ambulatory Visit: Payer: Self-pay

## 2020-06-29 DIAGNOSIS — C44311 Basal cell carcinoma of skin of nose: Secondary | ICD-10-CM | POA: Diagnosis not present

## 2020-06-29 DIAGNOSIS — C44319 Basal cell carcinoma of skin of other parts of face: Secondary | ICD-10-CM | POA: Diagnosis not present

## 2020-06-29 NOTE — Progress Notes (Signed)
Oncology Nurse Navigator Documentation  Met with Mr. Farrior after final RT to offer support and to celebrate end of radiation treatment.   Provided verbal post-RT guidance:  Importance of keeping his follow-up appointment with Dr. Isidore Moos in one month.   Importance of protecting treatment area from sun.  Continuation of Sonafine application 2-3 times daily, application of antibiotic ointment to areas of raw skin; when supply of Sonafine exhausted transition to OTC lotion with vitamin E.  Explained my role as navigator will continue for several more months, encouraged him to call me with needs/concerns.    Harlow Asa RN, BSN, OCN Head & Neck Oncology Nurse [place] at [facility] Phone # [phone]  Fax # [phone]

## 2020-06-30 ENCOUNTER — Other Ambulatory Visit: Payer: Self-pay | Admitting: Orthopedic Surgery

## 2020-06-30 DIAGNOSIS — M25562 Pain in left knee: Secondary | ICD-10-CM

## 2020-07-03 ENCOUNTER — Other Ambulatory Visit: Payer: Self-pay | Admitting: Orthopedic Surgery

## 2020-07-03 ENCOUNTER — Telehealth: Payer: Self-pay | Admitting: Internal Medicine

## 2020-07-03 NOTE — Progress Notes (Signed)
  Chronic Care Management   Note  07/03/2020 Name: DANNY YACKLEY MRN: [id number] DOB: [birth year]  Okey Regal Nelles is a 79 y.o. year old male who is a primary care patient of Colon Branch, MD. I reached out to [name] by phone today in response to a referral sent by Mr. Okey Regal Sukup's PCP, Colon Branch, MD.   Mr. Ballow was given information about Chronic Care Management services today including:  1. CCM service includes personalized support from designated clinical staff supervised by his physician, including individualized plan of care and coordination with other care providers 2. 24/7 contact phone numbers for assistance for urgent and routine care needs. 3. Service will only be billed when office clinical staff spend 20 minutes or more in a month to coordinate care. 4. Only one practitioner may furnish and bill the service in a calendar month. 5. The patient may stop CCM services at any time (effective at the end of the month) by phone call to the office staff.   Patient agreed to services and verbal consent obtained.   Follow up plan:   Carley Perdue [employer] Scheduler

## 2020-07-07 ENCOUNTER — Other Ambulatory Visit: Payer: Self-pay

## 2020-07-07 ENCOUNTER — Ambulatory Visit
Admission: RE | Admit: 2020-07-07 | Discharge: 2020-07-07 | Disposition: A | Discharge status: Home / Self Care | Payer: Medicare HMO | Source: Ambulatory Visit | Attending: Orthopedic Surgery | Admitting: Orthopedic Surgery

## 2020-07-07 DIAGNOSIS — M25562 Pain in left knee: Secondary | ICD-10-CM | POA: Diagnosis not present

## 2020-07-10 DIAGNOSIS — M25562 Pain in left knee: Secondary | ICD-10-CM | POA: Diagnosis not present

## 2020-07-13 ENCOUNTER — Other Ambulatory Visit: Payer: Self-pay

## 2020-07-13 ENCOUNTER — Ambulatory Visit (INDEPENDENT_AMBULATORY_CARE_PROVIDER_SITE_OTHER): Payer: Medicare HMO | Admitting: Family Medicine

## 2020-07-13 ENCOUNTER — Encounter (INDEPENDENT_AMBULATORY_CARE_PROVIDER_SITE_OTHER): Payer: Self-pay | Admitting: Family Medicine

## 2020-07-13 VITALS — BP 133/74 | HR 86 | Temp 97.8°F | Ht 69.0 in | Wt 208.0 lb

## 2020-07-13 DIAGNOSIS — E669 Obesity, unspecified: Secondary | ICD-10-CM | POA: Diagnosis not present

## 2020-07-13 DIAGNOSIS — E038 Other specified hypothyroidism: Secondary | ICD-10-CM

## 2020-07-13 DIAGNOSIS — Z6830 Body mass index (BMI) 30.0-30.9, adult: Secondary | ICD-10-CM

## 2020-07-13 DIAGNOSIS — M1612 Unilateral primary osteoarthritis, left hip: Secondary | ICD-10-CM

## 2020-07-13 MED ORDER — LEVOTHYROXINE SODIUM 25 MCG PO TABS: 25.0000 ug | ORAL_TABLET | Freq: Every day | ORAL | 1 refills | Status: DC

## 2020-07-13 NOTE — Progress Notes (Signed)
Chief Complaint:   OBESITY Philip [name] is here to discuss his progress with his obesity treatment plan along with follow-up of his obesity related diagnoses. Philip [name] is on following a lower carbohydrate, vegetable and lean protein rich diet plan and states he is following his eating plan approximately 50-60% of the time. Philip [name] states he is doing 0 minutes 0 times per week.  Today's visit was #: 85 Starting weight: 230 lbs Starting date: 01/18/2017 Today's weight: 208 lbs Today's date: 07/13/2020 Total lbs lost to date: 22 Total lbs lost since last in-office visit: 0  Interim History: Philip [name] has gained a bit of weight since starting prednisone for his hip and knee pain, and he hasn't been able to be as active with golfing over the last 2 weeks. He was doing well with his weight before this though.       Subjective:   1. Other specified hypothyroidism Philip [name] is stable on levothyroxine, and he is due for labs next month. He denies palpitations or tremors noted.  2. Osteoarthritis of left hip and knee, unspecified osteoarthritis type Philip [name] is on prednisone by his Orthopedic surgeon for his left knee osteoarthritis, and he notes decrease in pain and he is able to walk without a cane today. He is traditioning has been very active up to 2 weeks ago when his pain had worsened.  Assessment/Plan:   1. Other specified hypothyroidism Patient with long-standing hypothyroidism. We will refill levothyroxine for 2 months, and we will recheck labs in in 2 months. Philip [name] appears euthyroid. Orders and follow up as documented in patient record.  - levothyroxine (SYNTHROID) 25 MCG tablet; Take 1 tablet (25 mcg total) by mouth daily before breakfast.  Dispense: 30 tablet; Refill: 1  2. Osteoarthritis of left hip and knee, unspecified osteoarthritis type Philip [name] was educated on prednisone and weight gain, and will slowly increase activity as tolerated and as his surgeon recommended.   3. Class 1 obesity  with serious comorbidity and body mass index (BMI) of 30.0 to 30.9 in adult, unspecified obesity type Philip [name] is currently in the action stage of change. As such, his goal is to continue with weight loss efforts. He has agreed to the Category 3 Plan or following a lower carbohydrate, vegetable and lean protein rich diet plan.   Behavioral modification strategies: decreasing simple carbohydrates.  Philip [name] has agreed to follow-up with our clinic in 8 weeks. He was informed of the importance of frequent follow-up visits to maximize his success with intensive lifestyle modifications for his multiple health conditions.   Objective:   Blood pressure 133/74, pulse 86, temperature 97.8 F (36.6 C), height 5\' 9"  (1.753 m), weight 208 lb (94.3 kg), SpO2 96 %. Body mass index is 30.72 kg/m.  General: Cooperative, alert, well developed, in no acute distress. HEENT: Conjunctivae and lids unremarkable. Cardiovascular: Regular rhythm.  Lungs: Normal work of breathing. Neurologic: No focal deficits.   Lab Results  Component Value Date   CREATININE 0.98 03/02/2020   BUN 18 03/02/2020   NA 144 03/02/2020   K 4.6 03/02/2020   CL 103 03/02/2020   CO2 26 03/02/2020   Lab Results  Component Value Date   ALT 24 03/02/2020   AST 24 03/02/2020   ALKPHOS 56 03/02/2020   BILITOT 0.8 03/02/2020   Lab Results  Component Value Date   HGBA1C 5.8 (H) 03/02/2020   HGBA1C 5.6 10/14/2019   HGBA1C 5.8 (H) 12/10/2018   HGBA1C 5.8 (H) 06/06/2018   HGBA1C 5.6 08/14/2017  Lab Results  Component Value Date   INSULIN 8.3 03/02/2020   INSULIN 14.1 10/14/2019   INSULIN 15.4 12/10/2018   INSULIN 9.6 06/06/2018   INSULIN 10.8 08/14/2017   Lab Results  Component Value Date   TSH 4.890 (H) 04/27/2020   Lab Results  Component Value Date   CHOL 131 10/14/2019   HDL 57 10/14/2019   LDLCALC 60 10/14/2019   LDLDIRECT 68.0 04/13/2015   TRIG 68 10/14/2019   CHOLHDL 2.9 09/12/2018   Lab Results  Component  Value Date   WBC 3.5 (L) 04/24/2020   HGB 14.4 04/24/2020   HCT 42.4 04/24/2020   MCV 96.2 04/24/2020   PLT 131.0 (L) 04/24/2020   No results found for: IRON, TIBC, FERRITIN  Obesity Behavioral Intervention:   Approximately 15 minutes were spent on the discussion below.  ASK: We discussed the diagnosis of obesity with Philip [name] today and Philip [name] agreed to give [place] permission to discuss obesity behavioral modification therapy today.  ASSESS: Philip [name] has the diagnosis of obesity and his BMI today is 30.7. Philip [name] is in the action stage of change.   ADVISE: Philip [name] was educated on the multiple health risks of obesity as well as the benefit of weight loss to improve his health. He was advised of the need for long term treatment and the importance of lifestyle modifications to improve his current health and to decrease his risk of future health problems.  AGREE: Multiple dietary modification options and treatment options were discussed and Philip [name] agreed to follow the recommendations documented in the above note.  ARRANGE: Philip [name] was educated on the importance of frequent visits to treat obesity as outlined per CMS and USPSTF guidelines and agreed to schedule his next follow up appointment today.  Attestation Statements:   Reviewed by clinician on day of visit: allergies, medications, problem list, medical history, surgical history, family history, social history, and previous encounter notes.   I, Trixie Dredge, am acting as transcriptionist for [name], MD.  I have reviewed the above documentation for accuracy and completeness, and I agree with the above. -  [name], MD

## 2020-07-31 ENCOUNTER — Ambulatory Visit: Payer: Medicare HMO | Admitting: Internal Medicine

## 2020-07-31 DIAGNOSIS — M545 Low back pain, unspecified: Secondary | ICD-10-CM | POA: Diagnosis not present

## 2020-07-31 DIAGNOSIS — M5451 Vertebrogenic low back pain: Secondary | ICD-10-CM | POA: Diagnosis not present

## 2020-07-31 DIAGNOSIS — M25562 Pain in left knee: Secondary | ICD-10-CM | POA: Diagnosis not present

## 2020-08-04 NOTE — Progress Notes (Signed)
  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   Plan: The patient will follow-up with radiation oncology in 20mo .  -----------------------------------  Eppie Gibson, MD

## 2020-08-05 ENCOUNTER — Telehealth: Payer: Self-pay

## 2020-08-05 ENCOUNTER — Encounter: Payer: Self-pay | Admitting: Radiation Oncology

## 2020-08-05 ENCOUNTER — Ambulatory Visit
Admission: RE | Admit: 2020-08-05 | Discharge: 2020-08-05 | Disposition: A | Discharge status: Home / Self Care | Payer: Medicare HMO | Source: Ambulatory Visit | Attending: Radiation Oncology | Admitting: Radiation Oncology

## 2020-08-05 VITALS — BP 126/90 | HR 79 | Temp 97.1°F | Resp 18 | Ht 69.0 in | Wt 215.1 lb

## 2020-08-05 DIAGNOSIS — M1712 Unilateral primary osteoarthritis, left knee: Secondary | ICD-10-CM | POA: Diagnosis not present

## 2020-08-05 DIAGNOSIS — Z923 Personal history of irradiation: Secondary | ICD-10-CM | POA: Diagnosis not present

## 2020-08-05 DIAGNOSIS — Z7901 Long term (current) use of anticoagulants: Secondary | ICD-10-CM | POA: Diagnosis not present

## 2020-08-05 DIAGNOSIS — C44311 Basal cell carcinoma of skin of nose: Secondary | ICD-10-CM | POA: Diagnosis not present

## 2020-08-05 DIAGNOSIS — C44319 Basal cell carcinoma of skin of other parts of face: Secondary | ICD-10-CM

## 2020-08-05 DIAGNOSIS — Z79899 Other long term (current) drug therapy: Secondary | ICD-10-CM | POA: Insufficient documentation

## 2020-08-05 DIAGNOSIS — C44300 Unspecified malignant neoplasm of skin of unspecified part of face: Secondary | ICD-10-CM | POA: Insufficient documentation

## 2020-08-05 NOTE — Progress Notes (Signed)
Radiation Oncology         ([phone]) [phone] ________________________________  Name: Philip [name] MRN: [id number]  Date: 08/05/2020  DOB: 03-12-1941  Follow-Up Visit Note  outpatient  CC: Philip Branch, MD  Philip Branch, MD  Diagnosis and Prior Radiotherapy:    ICD-10-CM   1. Basal cell carcinoma (BCC) of left temple region  C44.319   2. Basal cell carcinoma (BCC) of left side of nose  C44.311   3. Skin cancer of face  C44.300     Cancer Staging: Stage I T1N0M0 basal cell carcinoma of the skin of left temple and left nose  Intent: Curative  Radiation Treatment Dates: 06/02/2020 through 06/29/2020 Site Technique Total Dose (Gy) Dose per Fx (Gy) Completed Fx Beam Energies  Nose: HN_L_Nose Complex 50/50 2.5 20/20 6E  Face: HN_L_templ specialPort 50/50 2.5 20/20 6E   CHIEF COMPLAINT: Here for follow-up and surveillance of skin cancer  Narrative:  The patient returns today for routine follow-up.   Philip [name] presents today for follow-up of radiation to his left nose and left temple completed on 06/29/2020  Pain issues, if any: Patient denies, though he is still dealing with issues with his sciatic nerve. Had a consult with an orthopedic surgeon who stated there may be an issue with his L5 vertebra, and he may benefit from surgical intervention.   Using a feeding tube?: N/A Weight changes, if any:  Wt Readings from Last 3 Encounters:  08/05/20 215 lb 2 oz (97.6 kg)  07/13/20 208 lb (94.3 kg)  05/27/20 (!) 210 lb (95.3 kg)   Swallowing issues, if any: Patient denies. Reports a healthy appetite, and denies any dietary restrictions Smoking or chewing tobacco? None Using fluoride trays daily? N/A Last ENT visit was on: N/A Other notable issues, if any: Reports skin in treatment fields is healing well. Reports he's sleeping well and denies any other lingering symptoms/concerns.   ALLERGIES:  has No Known Allergies.  Meds: Current Outpatient Medications  Medication Sig Dispense Refill    . amLODipine (NORVASC) 10 MG tablet Take 1 tablet (10 mg total) by mouth daily. 90 tablet 0  . atorvastatin (LIPITOR) 40 MG tablet TAKE 1 TABLET (40 MG TOTAL) BY MOUTH AT BEDTIME. 90 tablet 2  . B Complex-Biotin-FA (B-COMPLEX PO) Take 1 tablet by mouth daily.    . Cholecalciferol (VITAMIN D3) 5000 UNITS CAPS Take 5,000 Units by mouth daily.    Marland Kitchen co-enzyme Q-10 30 MG capsule Take 30 mg by mouth daily.    Marland Kitchen ELIQUIS 5 MG TABS tablet TAKE 1 TABLET TWICE DAILY 180 tablet 2  . hydrocortisone 2.5 % cream Apply 1 application topically as needed.    Marland Kitchen levothyroxine (SYNTHROID) 25 MCG tablet Take 1 tablet (25 mcg total) by mouth daily before breakfast. 30 tablet 1  . lisinopril (ZESTRIL) 20 MG tablet Take 1 tablet (20 mg total) by mouth daily. 90 tablet 3  . magnesium oxide (MAG-OX) 400 MG tablet Take 800 mg by mouth daily.    . Multiple Vitamins-Minerals (MULTIVITAMIN WITH MINERALS) tablet Take 1 tablet by mouth daily.    . Omega-3 Fatty Acids (OMEGA-3 FISH OIL) 1200 MG CAPS Take 2 1,200 mg capsules daily 90 capsule 3  . polyethylene glycol (MIRALAX / GLYCOLAX) packet Take 17 g by mouth daily.     No current facility-administered medications for this encounter.    Physical Findings: The patient is in no acute distress. Patient is alert and oriented.  height is 5\' 9"  (  1.753 m) and weight is 215 lb 2 oz (97.6 kg). His tympanic temperature is 97.1 F (36.2 C) (abnormal). His blood pressure is 126/90 and his pulse is 79. His respiration is 18 and oxygen saturation is 99%. .     The skin has healed well in the treatment areas of the left temple and left nose.  He has a little bit of alopecia at the left temple at the border of the radiation field.  He has 2 scabs that remain in the left temple field.  His skin remains a bit textured along the left nose but there is no suspicious evidence of residual cancer in either treatment field.  Lab Findings: Lab Results  Component Value Date   WBC 3.5 (L)  04/24/2020   HGB 14.4 04/24/2020   HCT 42.4 04/24/2020   MCV 96.2 04/24/2020   PLT 131.0 (L) 04/24/2020    Radiographic Findings: MR KNEE LEFT WO CONTRAST  Result Date: 07/07/2020 CLINICAL DATA:  Left knee pain centered about the patella for 2 months. No known injury. EXAM: MRI OF THE LEFT KNEE WITHOUT CONTRAST TECHNIQUE: Multiplanar, multisequence MR imaging of the knee was performed. No intravenous contrast was administered. COMPARISON:  None. FINDINGS: MENISCI Medial meniscus: Degenerative signal is seen throughout but no tear is identified. Lateral meniscus:  Intact. LIGAMENTS Cruciates:  Intact. Collaterals:  Intact. CARTILAGE Patellofemoral:  Normal. Medial: Mildly degenerated, most notable along the posterior aspect of the medial femoral condyle. Lateral:  Normal. Joint:  Very small effusion. Popliteal Fossa:  No Baker's cyst. Extensor Mechanism:  Intact. Bones: No fracture, stress change or worrisome lesion. A few tiny subchondral cysts in the posterior aspect of the medial femoral condyle are noted. Other: None. IMPRESSION: Mild medial compartment osteoarthritis. The exam is otherwise negative. Electronically Signed   By: Inge Rise M.D.   On: 07/07/2020 13:11    Impression/Plan:   He has healed well from radiotherapy.  He will continue to see dermatology at least twice a year.  I will see him back on an as-needed basis.  He knows to call if he has any questions or concerns in the future.  I wished him the best.  On date of service, in total, I spent 15 minutes on this encounter. Patient was seen in person.  _____________________________________   Eppie Gibson, MD

## 2020-08-05 NOTE — Telephone Encounter (Signed)
-----   Message from Angelia Mould, MD sent at 08/05/2020 12:28 PM EDT ----- Regarding: RE: Clearance. Yes. MRI is fine. Thanks CD ----- Message ----- From: Kaleen Mask, LPN Sent: [id number]   9:56 AM EDT To: Angelia Mould, MD Subject: Clearance.                                     Hey Dr. Scot [name].  This patient had an EVAR in 2015.  [facility] is waiting to schedule an MRI of his lumbar spine and wants to know if patient has clearance to do so?  Please advise.  Thanks,  Thurston Hole., LPN

## 2020-08-05 NOTE — Progress Notes (Signed)
Philip [name] presents today for follow-up of radiation to his left nose and left temple completed on 06/29/2020  Pain issues, if any: Patient denies, though he is still dealing with issues with his sciatic nerve. Had a consult with an orthopedic surgeon who stated there may be an issue with his L5 vertebra, and he may benefit from surgical intervention.   Using a feeding tube?: N/A Weight changes, if any:  Wt Readings from Last 3 Encounters:  08/05/20 215 lb 2 oz (97.6 kg)  07/13/20 208 lb (94.3 kg)  05/27/20 (!) 210 lb (95.3 kg)   Swallowing issues, if any: Patient denies. Reports a healthy appetite, and denies any dietary restrictions Smoking or chewing tobacco? None Using fluoride trays daily? N/A Last ENT visit was on: N/A Other notable issues, if any: Reports skin in treatment fields is healing well. Reports he's sleeping well and denies any other lingering symptoms/concerns.  Vitals:   08/05/20 1542  BP: 126/90  Pulse: 79  Resp: 18  Temp: (!) 97.1 F (36.2 C)  SpO2: 99%

## 2020-08-14 DIAGNOSIS — M545 Low back pain, unspecified: Secondary | ICD-10-CM | POA: Diagnosis not present

## 2020-08-19 ENCOUNTER — Other Ambulatory Visit: Payer: Self-pay

## 2020-08-19 ENCOUNTER — Encounter: Payer: Self-pay | Admitting: Internal Medicine

## 2020-08-19 ENCOUNTER — Ambulatory Visit (INDEPENDENT_AMBULATORY_CARE_PROVIDER_SITE_OTHER): Payer: Medicare HMO | Admitting: Internal Medicine

## 2020-08-19 VITALS — BP 117/70 | HR 62 | Temp 97.8°F | Resp 18 | Ht 69.0 in | Wt 219.0 lb

## 2020-08-19 DIAGNOSIS — I10 Essential (primary) hypertension: Secondary | ICD-10-CM

## 2020-08-19 DIAGNOSIS — E039 Hypothyroidism, unspecified: Secondary | ICD-10-CM | POA: Diagnosis not present

## 2020-08-19 DIAGNOSIS — M5410 Radiculopathy, site unspecified: Secondary | ICD-10-CM

## 2020-08-19 DIAGNOSIS — Z23 Encounter for immunization: Secondary | ICD-10-CM | POA: Diagnosis not present

## 2020-08-19 NOTE — Progress Notes (Signed)
Subjective:    Patient ID: Philip [name], male    DOB: 1941-04-16, 79 y.o.   MRN: [id number]  DOS:  08/19/2020 Type of visit - description: Follow-up HTN: Good compliance with medication, ambulatory BPs normal Radiculopathy: See last visit, saw Ortho, pain is about the same. PSA elevated, pending urology visit  BP Readings from Last 3 Encounters:  08/19/20 117/70  08/05/20 126/90  07/13/20 133/74    Wt Readings from Last 3 Encounters:  08/19/20 219 lb (99.3 kg)  08/05/20 215 lb 2 oz (97.6 kg)  07/13/20 208 lb (94.3 kg)     Review of Systems See above   Past Medical History:  Diagnosis Date  . AAA (abdominal aortic aneurysm) ([name])   . Atrial fibrillation ([name])   . CAD (coronary artery disease)    had a MI , s/p CABG  . Cataracts, bilateral    immature  . CHF (congestive heart failure) ([name])   . Dysrhythmia    paroximal a fib  . GERD (gastroesophageal reflux disease)    takes Omeprazole daily  . History of colon polyps   . History of kidney stones   . History of shingles   . Hyperlipidemia   . Hypertension    takes Amlodipine,Metoprolol,and Lisinopril daily  . Joint pain   . Joint swelling   . Myocardial infarction [facility])    several with last one being 2001(but never knew about any except 2001)  . OSA (obstructive sleep apnea)    started CPAP 12/09  . Peripheral vascular disease ([name])   . Pneumonia    as a child  . Skin cancer    several , one of them was melanoma (sees derm routinely)  . Tingling    feet occasionally  . Tinnitus     Past Surgical History:  Procedure Laterality Date  . ABDOMINAL AORTAGRAM N/A 06/16/2014   Procedure: ABDOMINAL Maxcine Ham;  Surgeon: Angelia Mould, MD;  Location: [facility] CATH LAB;  Service: Cardiovascular;  Laterality: N/A;  . ABDOMINAL AORTIC ENDOVASCULAR STENT GRAFT N/A 07/08/2014   Procedure: ABDOMINAL AORTIC ENDOVASCULAR STENT GRAFT/ GORE;  Surgeon: Angelia Mould, MD;  Location: [place];  Service: Vascular;   Laterality: N/A;  . CARDIAC CATHETERIZATION  2015  . COLONOSCOPY    . CORONARY ARTERY BYPASS GRAFT  1999   x 3 vessels  . LEFT HEART CATHETERIZATION WITH CORONARY/GRAFT ANGIOGRAM N/A 05/29/2014   Procedure: LEFT HEART CATHETERIZATION WITH [name];  Surgeon: [name], MD;  Location: [facility] CATH LAB;  Service: Cardiovascular;  Laterality: N/A;  . PENILE PROSTHESIS IMPLANT  08/2005  . RHINOPLASTY  1975    Allergies as of 08/19/2020   No Known Allergies     Medication List       Accurate as of August 19, 2020  9:39 AM. If you have any questions, ask your nurse or doctor.        amLODipine 10 MG tablet Commonly known as: NORVASC Take 1 tablet (10 mg total) by mouth daily.   atorvastatin 40 MG tablet Commonly known as: LIPITOR TAKE 1 TABLET (40 MG TOTAL) BY MOUTH AT BEDTIME.   B-COMPLEX PO Take 1 tablet by mouth daily.   co-enzyme Q-10 30 MG capsule Take 30 mg by mouth daily.   Eliquis 5 MG Tabs tablet Generic drug: apixaban TAKE 1 TABLET TWICE DAILY   hydrocortisone 2.5 % cream Apply 1 application topically as needed.   levothyroxine 25 MCG tablet Commonly known as: SYNTHROID Take 1 tablet (  25 mcg total) by mouth daily before breakfast.   lisinopril 20 MG tablet Commonly known as: ZESTRIL Take 1 tablet (20 mg total) by mouth daily.   magnesium oxide 400 MG tablet Commonly known as: MAG-OX Take 800 mg by mouth daily.   multivitamin with minerals tablet Take 1 tablet by mouth daily.   Omega-3 Fish Oil 1200 MG Caps Take 2 1,200 mg capsules daily   polyethylene glycol 17 g packet Commonly known as: MIRALAX / GLYCOLAX Take 17 g by mouth daily.   Vitamin D3 125 MCG (5000 UT) Caps Take 5,000 Units by mouth daily.          Objective:   Physical Exam BP 117/70 (BP Location: Left Arm, Patient Position: Sitting, Cuff Size: Small)   Pulse 62   Temp 97.8 F (36.6 C) (Oral)   Resp 18   Ht 5\' 9"  (1.753 m)   Wt 219 lb (99.3 kg)   SpO2  95%   BMI 32.34 kg/m  General:   Well developed, NAD, BMI noted. HEENT:  Normocephalic . Face symmetric, atraumatic Lungs:  CTA B Normal respiratory effort, no intercostal retractions, no accessory muscle use. Heart: Regular with occasional extra beats Lower extremities: Trace pretibial edema (sock marks) Skin: Not pale. Not jaundice Neurologic:  alert & oriented X3.  Speech normal, gait appropriate for age and unassisted Psych--  Cognition and judgment appear intact.  Cooperative with normal attention span and concentration.  Behavior appropriate. No anxious or depressed appearing.      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 Fib, paroxysmal  GERD  Chronic constipation OSA- Cpap (Dr [name]) ED- penile implant H/o skin cancer, Dr. [name] H/o Kidney stones 2016  PLAN:  HTN: Since the last office visit amlodipine was increased to 10 mg, he also takes Lisinopril, BPs are very good.  No change Hypothyroidism: On Synthroid, managed by the wellness clinic Radiculopathy: See last visit, saw Ortho, had a hip local injection with minimal help, recently had an MRI and plans to see Ortho this week to discuss results Elevated PSA: To see urology soon Preventive care: Flu shot today, encouraged to proceed with a Covid booster. RTC CPX 03/2021.    This visit occurred during the SARS-CoV-2 public health emergency.  Safety protocols were in place, including screening questions prior to the visit, additional usage of staff PPE, and extensive cleaning of exam room while observing appropriate contact time as indicated for disinfecting solutions.

## 2020-08-19 NOTE — Patient Instructions (Addendum)
Check the  blood pressure regularly BP GOAL is between 110/65 and  135/85. If it is consistently higher or lower, let me know     [place], PLEASE SCHEDULE YOUR APPOINTMENTS Come back for a physical exam by 03/2021

## 2020-08-19 NOTE — Progress Notes (Signed)
Pre visit review using our clinic review tool, if applicable. No additional management support is needed unless otherwise documented below in the visit note. 

## 2020-08-20 NOTE — Assessment & Plan Note (Signed)
HTN: Since the last office visit amlodipine was increased to 10 mg, he also takes Lisinopril, BPs are very good.  No change Hypothyroidism: On Synthroid, managed by the wellness clinic Radiculopathy: See last visit, saw Ortho, had a hip local injection with minimal help, recently had an MRI and plans to see Ortho this week to discuss results Elevated PSA: To see urology soon Preventive care: Flu shot today, encouraged to proceed with a Covid booster. RTC CPX 03/2021.

## 2020-08-21 DIAGNOSIS — M545 Low back pain, unspecified: Secondary | ICD-10-CM | POA: Diagnosis not present

## 2020-08-25 ENCOUNTER — Telehealth: Payer: Self-pay | Admitting: Pharmacist

## 2020-08-25 DIAGNOSIS — N402 Nodular prostate without lower urinary tract symptoms: Secondary | ICD-10-CM | POA: Diagnosis not present

## 2020-08-25 DIAGNOSIS — R972 Elevated prostate specific antigen [PSA]: Secondary | ICD-10-CM | POA: Diagnosis not present

## 2020-08-25 NOTE — Progress Notes (Addendum)
Chronic Care Management Pharmacy Assistant   Name: Philip [name]  MRN: [id number] DOB: June 14, 1941  Reason for Encounter: Initial Questions   PCP : Colon Branch, MD  Allergies:  No Known Allergies  Medications: Outpatient Encounter Medications as of 08/25/2020  Medication Sig  . amLODipine (NORVASC) 10 MG tablet Take 1 tablet (10 mg total) by mouth daily.  Marland Kitchen atorvastatin (LIPITOR) 40 MG tablet TAKE 1 TABLET (40 MG TOTAL) BY MOUTH AT BEDTIME.  . B Complex-Biotin-FA (B-COMPLEX PO) Take 1 tablet by mouth daily.  . Cholecalciferol (VITAMIN D3) 5000 UNITS CAPS Take 5,000 Units by mouth daily.  Marland Kitchen co-enzyme Q-10 30 MG capsule Take 30 mg by mouth daily.  Marland Kitchen ELIQUIS 5 MG TABS tablet TAKE 1 TABLET TWICE DAILY  . hydrocortisone 2.5 % cream Apply 1 application topically as needed.  Marland Kitchen levothyroxine (SYNTHROID) 25 MCG tablet Take 1 tablet (25 mcg total) by mouth daily before breakfast.  . lisinopril (ZESTRIL) 20 MG tablet Take 1 tablet (20 mg total) by mouth daily.  . magnesium oxide (MAG-OX) 400 MG tablet Take 800 mg by mouth daily.  . Multiple Vitamins-Minerals (MULTIVITAMIN WITH MINERALS) tablet Take 1 tablet by mouth daily.  . Omega-3 Fatty Acids (OMEGA-3 FISH OIL) 1200 MG CAPS Take 2 1,200 mg capsules daily  . polyethylene glycol (MIRALAX / GLYCOLAX) packet Take 17 g by mouth daily.   No facility-administered encounter medications on file as of 08/25/2020.    Current Diagnosis: Patient Active Problem List   Diagnosis Date Noted  . Skin cancer of face 08/05/2020  . Depression 04/02/2019  . Lower extremity edema 12/22/2017  . Coronary artery disease involving native coronary artery of native heart without angina pectoris 12/22/2017  . Other specified hypothyroidism 10/02/2017  . Osteoarthritis of left hip 06/26/2017  . Class 1 obesity with serious comorbidity and body mass index (BMI) of 30.0 to 30.9 in adult 05/08/2017  . Hyperglycemia 04/19/2017  . Plantar fasciitis of left foot  04/11/2017  . Vitamin D deficiency 03/29/2017  . Idiopathic peripheral neuropathy 03/13/2017  . Erectile dysfunction 02/22/2017  . PCP NOTES >>>>>>>>>>>>>>>>>>>>>>>>>>>>>>>>>>>>>>> 12/21/2015  . Chronic constipation 12/21/2015  . Atrial fibrillation ([place]) 05/07/2014  . Encounter for pre-operative cardiovascular clearance 05/07/2014  . AAA (abdominal aortic aneurysm) without rupture ([place]) 04/30/2014  . Fatigue 11/25/2013  . Hypomagnesemia 06/29/2011  . Annual physical exam 02/16/2011  . Insomnia 02/16/2011  . OSA (obstructive sleep apnea)   . History of skin cancer 07/24/2008  . Coronary atherosclerosis 07/04/2008  . Mixed hyperlipidemia 02/08/2007  . Essential hypertension 02/08/2007    Goals Addressed   None     Have you seen any other providers since your last visit? Yes, patient see's the orthopaedic surgeon Dr. Gladstone Lighter and is preparing for physical therapy. Also saw his urologist 08/25/20   Any changes in your medications or health? No medication changes, recent sciatica nerve issues. Patient states he recalls the onset of back pain was in June   Any side effects from any medications? None   Do you have an symptoms or problems not managed by your medications? None   Any concerns about your health right now? Patient is concerned with his prostate. Has a family history of prostate cancer and recent MRI showed a growth in the prostate area   Has your provider asked that you check blood pressure, blood sugar, or follow special diet at home? Yes, Dr. Leafy Ro asked him to follow a low carb diet.   Do you  get any type of exercise on a regular basis? Patient states he use to walk daily and go to the gym three times a week. Currently he is not able to do much physical activity due to his back pain.   Can you think of a goal you would like to reach for your health? Patient states he would like to return to his active lifestyle and lose some weight.    Do you have any  problems getting your medications? No, patient is using [facility] mail order for ongoing medications and the local [place] for medication needed right away.   Is there anything that you would like to discuss during the appointment? Not that he can think of  Reminded the patient to please bring medications and supplements to appointment in the office on  Wednesday October 27th at 9:00 am    Follow-Up:  Pharmacist [place], [place] Pharmacist Assistant [phone]  Reviewed by: De Blanch, PharmD Clinical Pharmacist [facility] [phone]

## 2020-08-26 ENCOUNTER — Other Ambulatory Visit: Payer: Self-pay | Admitting: Urology

## 2020-08-26 ENCOUNTER — Ambulatory Visit: Payer: Medicare HMO | Admitting: Pharmacist

## 2020-08-26 ENCOUNTER — Other Ambulatory Visit: Payer: Self-pay

## 2020-08-26 VITALS — BP 127/71 | HR 88 | Temp 98.0°F | Wt 220.0 lb

## 2020-08-26 DIAGNOSIS — N402 Nodular prostate without lower urinary tract symptoms: Secondary | ICD-10-CM

## 2020-08-26 DIAGNOSIS — I10 Essential (primary) hypertension: Secondary | ICD-10-CM

## 2020-08-26 DIAGNOSIS — R7303 Prediabetes: Secondary | ICD-10-CM

## 2020-08-26 DIAGNOSIS — E782 Mixed hyperlipidemia: Secondary | ICD-10-CM

## 2020-08-26 DIAGNOSIS — E039 Hypothyroidism, unspecified: Secondary | ICD-10-CM

## 2020-08-26 DIAGNOSIS — R972 Elevated prostate specific antigen [PSA]: Secondary | ICD-10-CM

## 2020-08-26 DIAGNOSIS — Z87898 Personal history of other specified conditions: Secondary | ICD-10-CM

## 2020-08-26 NOTE — Chronic Care Management (AMB) (Signed)
Chronic Care Management Pharmacy  Name: LEVONTE MOLINA  MRN: [id number] DOB: 01-18-1941  Chief Complaint/ HPI  [name],  79 y.o. , male presents for their Initial CCM visit with the clinical pharmacist In office.  PCP : Colon Branch, MD  Their chronic conditions include: Hypertension, Hyperlipidemia/CAD, Pre-Diabetes, AFib, Hypothyroidism, Chronic Constipation, Elevated PSA  Office Visits: 08/19/20: Visit w/ Dr. Larose Kells - HTN: amlodipine increased to 75m at last visit. BP very good. No change. Hypothyroid: managed by wellness clinic. Hip Pain: Managed by ortho. Elevated PSA: Following with urology. No med changes noted.   04/24/20: Visit w/ Dr. PLarose Kells- HTN: Increase amlodipine to 163mdaily. Follow with Dr. BeLeafy Roor elevated TSH.  Consult Visit: 07/13/20: Weight Mgmt visit w/ Dr. BeLeafy Ro Starting wt: 230lbs, Today's wt: 208lbs. Wt lost since last visit: 0lbs. Wt gain since starting prednisone. No med changes noted.   07/10/20: Ortho visit w/ Dr. GiGladstone Lighter8/30/21: Final tx for basal cell carcinoma  05/27/20: Vascular Surgery visit w/ Dr. 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 RTC 1 year for CT of abdomen and pelvis. Follow up with primary care for concern of hernia.  Medications: Outpatient Encounter Medications as of 08/26/2020  Medication Sig  . amLODipine (NORVASC) 10 MG tablet Take 1 tablet (10 mg total) by mouth daily.  . Marland Kitchentorvastatin (LIPITOR) 40 MG tablet TAKE 1 TABLET (40 MG TOTAL) BY MOUTH AT BEDTIME.  . Marland KitchenLIQUIS 5 MG TABS tablet TAKE 1 TABLET TWICE DAILY  . levothyroxine (SYNTHROID) 25 MCG tablet Take 1 tablet (25 mcg total) by mouth daily before breakfast.  . lisinopril (ZESTRIL) 20 MG tablet Take 1 tablet (20 mg total) by mouth daily.  . Multiple Vitamins-Minerals (MULTIVITAMIN WITH  MINERALS) tablet Take 1 tablet by mouth daily.  . B Complex-Biotin-FA (B-COMPLEX PO) Take 1 tablet by mouth daily.  . Cholecalciferol (VITAMIN D3) 5000 UNITS CAPS Take 5,000 Units by mouth daily.  . Marland Kitcheno-enzyme Q-10 30 MG capsule Take 30 mg by mouth daily.  . hydrocortisone 2.5 % cream Apply 1 application topically as needed.  . magnesium oxide (MAG-OX) 400 MG tablet Take 800 mg by mouth daily.  . Omega-3 Fatty Acids (OMEGA-3 FISH OIL) 1200 MG CAPS Take 2 1,200 mg capsules daily  . polyethylene glycol (MIRALAX / GLYCOLAX) packet Take 17 g by mouth daily.   No facility-administered encounter medications on file as of 08/26/2020.     Current Diagnosis/Assessment:  Goals Addressed            This Visit's Progress   . Chronic Care Management Pharmacy Care Plan       CARE PLAN ENTRY (see longitudinal plan of care for additional care plan information)  Current Barriers:  . Chronic Disease Management support, education, and care coordination needs related to Hypertension, Hyperlipidemia/CAD, Pre-Diabetes, AFib, Hypothyroidism, Chronic Constipation, Elevated PSA   Hypertension BP Readings from Last 3 Encounters:  08/26/20 127/71  08/19/20 117/70  08/05/20 126/90   . Pharmacist Clinical Goal(s): o Over the next 180 days, patient will work with PharmD and providers to maintain BP goal <130/80 . Current regimen:  . Amlodipine 1062maily PM . Lisinopril 23m36mily AM . Interventions: o Discussed BP goal . Patient self care activities - Over the next 180 days,  patient will: o Maintain BP <130/80  Hyperlipidemia Lab Results  Component Value Date/Time   LDLCALC 60 10/14/2019 08:41 AM   LDLDIRECT 68.0 04/13/2015 08:23 AM   . Pharmacist Clinical Goal(s): o Over the next 180 days, patient will work with PharmD and providers to maintain LDL goal < 70 . Current regimen:  . Atorvastatin 40mg daily . Fish Oil 1200mg #2 daily . CoQ10 100mg daily . Interventions: o Discussed LDL goal    . Patient self care activities - Over the next 180 days, patient will: o Maintain cholesterol medication regimen.   Pre-Diabetes Lab Results  Component Value Date/Time   HGBA1C 5.8 (H) 03/02/2020 12:50 PM   HGBA1C 5.6 10/14/2019 08:41 AM   . Pharmacist Clinical Goal(s): o Over the next 180 days, patient will work with PharmD and providers to maintain A1c goal <6.5% . Current regimen:  o Diet and exercise management   . Interventions: o Discussed diet and exercise o Discussed a1c goal . Patient self care activities - Over the next 180 days, patient will: o Maintain a1c less than 6.5%  Hypothyroidism  Lab Results  Component Value Date/Time   TSH 4.890 (H) 04/27/2020 04:37 PM   TSH 4.660 (H) 03/02/2020 12:50 PM   FREET4 1.24 04/27/2020 04:37 PM   FREET4 1.17 03/02/2020 12:50 PM   . Pharmacist Clinical Goal(s) o Over the next 180 days, patient will work with PharmD and providers to reduce symptoms associated with hypothyroidism . Current regimen:  o Levothyroxine 25mcg daily . Interventions: o Recommended patient to follow up with Dr. Beasley . Patient self care activities - Over the next 180 days, patient will: o Follow up with Dr. Beasley  Elevated PSA . Pharmacist Clinical Goal(s) o Over the next 180 days, patient will work with PharmD and providers to reduce symptoms associated with elevated PSA . Current regimen:  o None . Interventions: o Recommended patient to follow closely with urology o Recommended patient follow up on MRI . Patient self care activities - Over the next 180 days, patient will: o Follow up with Urology o Schedule MRI  Medication management . Pharmacist Clinical Goal(s): o Over the next 180 days, patient will work with PharmD and providers to maintain optimal medication adherence . Current pharmacy: [facility] . Interventions o Comprehensive medication review performed. o Continue current medication management strategy . Patient  self care activities - Over the next 180 days, patient will: o Focus on medication adherence by filling and taking medications appropriately  o Take medications as prescribed o Report any questions or concerns to PharmD and/or provider(s)  Initial goal documentation       Social Hx:  Married for 36 years. Wife has scoliosis.  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   Hypertension   BP goal is:  <130/80  Office blood pressures are  BP Readings from Last 3 Encounters:  08/26/20 127/71  08/19/20 117/70  08/05/20 126/90   Patient checks BP at home infrequently Patient home BP readings are ranging: Unable to assess  Patient has failed these meds in the past: None noted  Patient is currently controlled on the following medications:  . Amlodipine 10mg daily PM . Lisinopril 20mg   daily AM  Denies headache, chest pain, or dizziness  Patient would like to reduce his blood pressure regimen.  He would like to D/C amlodipine.  Discussed the importance of BP control noting his history of CAD, but this is something we could consider to explore. Will have patient monitor home BP to determine appropriateness of down titrating his BP regimen.  He realizes losing weight will help him reach this goal. His goal is to be less than 200lbs.   We discussed BP goal  Plan -Continue current medications  -Check BP 1-2 times per week and record     Hyperlipidemia/CAD   LDL goal <70  Lipid Panel     Component Value Date/Time   CHOL 131 10/14/2019 0841   TRIG 68 10/14/2019 0841   TRIG 256 09/14/2009 0000   HDL 57 10/14/2019 0841   LDLCALC 60 10/14/2019 0841   LDLDIRECT 68.0 04/13/2015 0823    Hepatic Function Latest Ref Rng &  Units 03/02/2020 10/14/2019 12/10/2018  Total Protein 6.0 - 8.5 g/dL 6.6 6.6 6.9  Albumin 3.7 - 4.7 g/dL 4.6 4.8(H) 4.9(H)  AST 0 - 40 IU/L 24 27 31  ALT 0 - 44 IU/L 24 26 34  Alk Phosphatase 39 - 117 IU/L 56 56 73  Total Bilirubin 0.0 - 1.2 mg/dL 0.8 0.7 0.6     The ASCVD Risk score (Goff DC Jr., et al., 2013) failed to calculate for the following reasons:   The 2013 ASCVD risk score is only valid for ages 40 to 79   Patient has failed these meds in past: None noted  Patient is currently controlled on the following medications:  . Atorvastatin 40mg daily . Fish Oil 1200mg #2 daily . CoQ10 100mg daily  We discussed:  LDL goal  Plan -Continue current medications  Pre-Diabetes   A1c goal <6.5%  Recent Relevant Labs: Lab Results  Component Value Date/Time   HGBA1C 5.8 (H) 03/02/2020 12:50 PM   HGBA1C 5.6 10/14/2019 08:41 AM   GFR 76.49 04/26/2019 01:41 PM   GFR 63.52 01/01/2018 10:17 AM   Patient has failed these meds in past: None noted  Patient is currently controlled on the following medications: . None  Diet  Works to reduce calories and carbohydrates. Rarely eats pizza (once in last year).  B - oatmeal or bacon and egg burrito L - Ham or turkey sandwich using tortilla D - Fish or other meat, beans (tries to avoid white rice, potatoes, and corn), eats brown rice Snacks - sugar free cookie, low calorie snack Drinks - water (4-8 glasses per day), tries to avoid juice, 2-3 cups of coffee with half and half   Exercise Limited due to back pain He reports he had an MRI of his back and that he is not a candidate for surgery. He is set to have PT.  Played golf twice weekly until the last 2 months   We discussed: diet and exercise extensively and A1c goal  Plan -Continue control with diet and exercise  AFIB   Patient is currently neither rate or rhythm controlled.  Patient has failed these meds in past: None noted  Patient is currently controlled on the following  medications:   Eliquis 5mg twice daily   What happened to metoprolol? When he lost weight he was taken off. Felt lethargic when taking it.  He states eliquis is expensive when he is in the donut hole, but his income is above the threshold for patient assistance  Plan -Continue   current medications   Hypothyroidism   [facility]  Lab Results  Component Value Date/Time   TSH 4.890 (H) 04/27/2020 04:37 PM   TSH 4.660 (H) 03/02/2020 12:50 PM   FREET4 1.24 04/27/2020 04:37 PM   FREET4 1.17 03/02/2020 12:50 PM    Patient has failed these meds in past: None noted Patient is currently uncontrolled on the following medications:  . Levothyroxine 52mg daily  Going to Dr. BLeafy Roin November   We discussed:  TSH normal limits. Pt denies symptoms  Plan -Follow up with Dr. BLeafy Rofor repeat TSH -Continue current medications   Chronic Constipation    Patient has failed these meds in past: None noted Patient is currently controlled on the following medications: .Marland KitchenMiralax 17g daily  Plan  -Continue current medications   Elevated PSA   PSA  Date Value Ref Range Status  04/24/2020 4.37 (H) 0.10 - 4.00 ng/mL Final    Comment:    Test performed using Access Hybritech PSA Assay, a parmagnetic partical, chemiluminecent immunoassay.  10/21/2019 4.22 (H) 0.10 - 4.00 ng/mL Final    Comment:    Test performed using Access Hybritech PSA Assay, a parmagnetic partical, chemiluminecent immunoassay.  01/01/2018 3.59 0.10 - 4.00 ng/mL Final    Comment:    Test performed using Access Hybritech PSA Assay, a parmagnetic partical, chemiluminecent immunoassay.     Patient has failed these meds in past: None noted Patient is currently stable on the following medications:  . 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 "It felt soft and pliable vs hard" upon exam. He recommended and MRI. Pt has yet to  schedule MRI. Still waiting for insurance approval then it will be scheduled.  Gets up once a night Feels he empties his bladder Trickles at times when he goes upon first urge, but if he waits he has a full stream.    Plan -Continue follow up with Urology. Make appt for MRI  Vaccines   Reviewed and discussed patient's vaccination history.    Immunization History  Administered Date(s) Administered  . Fluad Quad(high Dose 65+) 08/19/2020  . H1N1 11/06/2008  . Influenza Split 09/15/2011, 09/14/2012  . Influenza Whole 08/06/2010  . Influenza, High Dose Seasonal PF 09/16/2015, 09/26/2016, 09/01/2017, 09/03/2018, 08/29/2019  . Influenza,inj,Quad PF,6+ Mos 10/01/2013  . Influenza-Unspecified 08/31/2014  . PFIZER SARS-COV-2 Vaccination 12/07/2019, 01/01/2020  . Pneumococcal Conjugate-13 04/22/2014  . Pneumococcal Polysaccharide-23 01/01/2010, 10/18/2017  . Td 04/22/2014  . Zoster 10/31/2012  . Zoster Recombinat (Shingrix) 09/04/2018    Plan -Recommended patient receive 2nd Shingrix vaccine in pharmacy.   [place]   [facility] [place] [place]? Yes (fills once a week) Pt admits missing his medication about once a month  Miscellaneous Meds B complex Vitamin D 5000 units Hydrocortisone 2.5% cream Magnesium Oxide 4036m Multivitamin    We discussed: Current pharmacy is preferred with insurance plan and patient is satisfied with pharmacy services  Plan -Continue current medication management strategy    Follow up:  6 month phone visit  KaDe BlanchPharmD Clinical Pharmacist [phone]

## 2020-08-26 NOTE — Patient Instructions (Signed)
Visit Information  Goals Addressed            This Visit's Progress   . Chronic Care Management Pharmacy Care Plan       CARE PLAN ENTRY (see longitudinal plan of care for additional care plan information)  Current Barriers:  . Chronic Disease Management support, education, and care coordination needs related to Hypertension, Hyperlipidemia/CAD, Pre-Diabetes, AFib, Hypothyroidism, Chronic Constipation, Elevated PSA   Hypertension BP Readings from Last 3 Encounters:  08/26/20 127/71  08/19/20 117/70  08/05/20 126/90   . Pharmacist Clinical Goal(s): o Over the next 180 days, patient will work with PharmD and providers to maintain BP goal <130/80 . Current regimen:  . Amlodipine 10mg  daily PM . Lisinopril 20mg  daily AM . Interventions: o Discussed BP goal . Patient self care activities - Over the next 180 days, patient will: o Maintain BP <130/80  Hyperlipidemia Lab Results  Component Value Date/Time   LDLCALC 60 10/14/2019 08:41 AM   LDLDIRECT 68.0 04/13/2015 08:23 AM   . Pharmacist Clinical Goal(s): o Over the next 180 days, patient will work with PharmD and providers to maintain LDL goal < 70 . Current regimen:  . Atorvastatin 40mg  daily . Fish Oil 1200mg  #2 daily . CoQ10 100mg  daily . Interventions: o Discussed LDL goal  . Patient self care activities - Over the next 180 days, patient will: o Maintain cholesterol medication regimen.   Pre-Diabetes Lab Results  Component Value Date/Time   HGBA1C 5.8 (H) 03/02/2020 12:50 PM   HGBA1C 5.6 10/14/2019 08:41 AM   . Pharmacist Clinical Goal(s): o Over the next 180 days, patient will work with PharmD and providers to maintain A1c goal <6.5% . Current regimen:  o Diet and exercise management   . Interventions: o Discussed diet and exercise o Discussed a1c goal . Patient self care activities - Over the next 180 days, patient will: o Maintain a1c less than 6.5%  Hypothyroidism  Lab Results  Component Value  Date/Time   TSH 4.890 (H) 04/27/2020 04:37 PM   TSH 4.660 (H) 03/02/2020 12:50 PM   FREET4 1.24 04/27/2020 04:37 PM   FREET4 1.17 03/02/2020 12:50 PM   . Pharmacist Clinical Goal(s) o Over the next 180 days, patient will work with PharmD and providers to reduce symptoms associated with hypothyroidism . Current regimen:  o Levothyroxine 46mcg daily . Interventions: o Recommended patient to follow up with Dr. Leafy Ro . Patient self care activities - Over the next 180 days, patient will: o Follow up with Dr. Leafy Ro  Elevated PSA . Pharmacist Clinical Goal(s) o Over the next 180 days, patient will work with PharmD and providers to reduce symptoms associated with elevated PSA . Current regimen:  o None . Interventions: o Recommended patient to follow closely with urology o Recommended patient follow up on MRI . Patient self care activities - Over the next 180 days, patient will: o Follow up with Urology o Schedule MRI  Medication management . Pharmacist Clinical Goal(s): o Over the next 180 days, patient will work with PharmD and providers to maintain optimal medication adherence . Current pharmacy: [facility] . Interventions o Comprehensive medication review performed. o Continue current medication management strategy . Patient self care activities - Over the next 180 days, patient will: o Focus on medication adherence by filling and taking medications appropriately  o Take medications as prescribed o Report any questions or concerns to PharmD and/or provider(s)  Initial goal documentation        Mr.  Philip [name] was given information about Chronic Care Management services today including:  1. CCM service includes personalized support from designated clinical staff supervised by his physician, including individualized plan of care and coordination with other care providers 2. 24/7 contact phone numbers for assistance for urgent and routine care needs. 3. Standard  insurance, coinsurance, copays and deductibles apply for chronic care management only during months in which we provide at least 20 minutes of these services. Most insurances cover these services at 100%, however patients may be responsible for any copay, coinsurance and/or deductible if applicable. This service may help you avoid the need for more expensive face-to-face services. 4. Only one practitioner may furnish and bill the service in a calendar month. 5. The patient may stop CCM services at any time (effective at the end of the month) by phone call to the office staff.  Patient agreed to services and verbal consent obtained.   The patient verbalized understanding of instructions provided today and agreed to receive a mailed copy of patient instruction and/or educational materials. Telephone follow up appointment with pharmacy team member scheduled for: 02/24/2021  Melvenia Beam See Beharry, PharmD Clinical Pharmacist [facility] [phone]   Prediabetes Eating Plan Prediabetes is a condition that causes blood sugar (glucose) levels to be higher than normal. This increases the risk for developing diabetes. In order to prevent diabetes from developing, your health care provider may recommend a diet and other lifestyle changes to help you:  Control your blood glucose levels.  Improve your cholesterol levels.  Manage your blood pressure. Your health care provider may recommend working with a diet and nutrition specialist (dietitian) to make a meal plan that is best for you. What are tips for following this plan? Lifestyle  Set weight loss goals with the help of your health care team. It is recommended that most people with prediabetes lose 7% of their current body weight.  Exercise for at least 30 minutes at least 5 days a week.  Attend a support group or seek ongoing support from a mental health counselor.  Take over-the-counter and prescription medicines only  as told by your health care provider. Reading food labels  Read food labels to check the amount of fat, salt (sodium), and sugar in prepackaged foods. Avoid foods that have: ? Saturated fats. ? Trans fats. ? Added sugars.  Avoid foods that have more than 300 milligrams (mg) of sodium per serving. Limit your daily sodium intake to less than 2,300 mg each Buna Cuppett. Shopping  Avoid buying pre-made and processed foods. Cooking  Cook with olive oil. Do not use butter, lard, or ghee.  Bake, broil, grill, or boil foods. Avoid frying. Meal planning   Work with your dietitian to develop an eating plan that is right for you. This may include: ? Tracking how many calories you take in. Use a food diary, notebook, or mobile application to track what you eat at each meal. ? Using the glycemic index (GI) to plan your meals. The index tells you how quickly a food will raise your blood glucose. Choose low-GI foods. These foods take a longer time to raise blood glucose.  Consider following a Mediterranean diet. This diet includes: ? Several servings each Jamond Neels of fresh fruits and vegetables. ? Eating fish at least twice a week. ? Several servings each Heavenleigh Petruzzi of whole grains, beans, nuts, and seeds. ? Using olive oil instead of other fats. ? Moderate alcohol consumption. ? Eating small amounts of red meat  and whole-fat dairy.  If you have high blood pressure, you may need to limit your sodium intake or follow a diet such as the DASH eating plan. DASH is an eating plan that aims to lower high blood pressure. What foods are recommended? The items listed below may not be a complete list. Talk with your dietitian about what dietary choices are best for you. Grains Whole grains, such as whole-wheat or whole-grain breads, crackers, cereals, and pasta. Unsweetened oatmeal. Bulgur. Barley. Quinoa. Brown rice. Corn or whole-wheat flour tortillas or taco shells. Vegetables Lettuce. Spinach. Peas. Beets. Cauliflower.  Cabbage. Broccoli. Carrots. Tomatoes. Squash. Eggplant. Herbs. Peppers. Onions. Cucumbers. Brussels sprouts. Fruits Berries. Bananas. Apples. Oranges. Grapes. Papaya. Mango. Pomegranate. Kiwi. Grapefruit. Cherries. Meats and other protein foods Seafood. Poultry without skin. Lean cuts of pork and beef. Tofu. Eggs. Nuts. Beans. Dairy Low-fat or fat-free dairy products, such as yogurt, cottage cheese, and cheese. Beverages Water. Tea. Coffee. Sugar-free or diet soda. Seltzer water. Lowfat or no-fat milk. Milk alternatives, such as soy or almond milk. Fats and oils Olive oil. Canola oil. Sunflower oil. Grapeseed oil. Avocado. Walnuts. Sweets and desserts Sugar-free or low-fat pudding. Sugar-free or low-fat ice cream and other frozen treats. Seasoning and other foods Herbs. Sodium-free spices. Mustard. Relish. Low-fat, low-sugar ketchup. Low-fat, low-sugar barbecue sauce. Low-fat or fat-free mayonnaise. What foods are not recommended? The items listed below may not be a complete list. Talk with your dietitian about what dietary choices are best for you. Grains Refined white flour and flour products, such as bread, pasta, snack foods, and cereals. Vegetables Canned vegetables. Frozen vegetables with butter or cream sauce. Fruits Fruits canned with syrup. Meats and other protein foods Fatty cuts of meat. Poultry with skin. Breaded or fried meat. Processed meats. Dairy Full-fat yogurt, cheese, or milk. Beverages Sweetened drinks, such as sweet iced tea and soda. Fats and oils Butter. Lard. Ghee. Sweets and desserts Baked goods, such as cake, cupcakes, pastries, cookies, and cheesecake. Seasoning and other foods Spice mixes with added salt. Ketchup. Barbecue sauce. Mayonnaise. Summary  To prevent diabetes from developing, you may need to make diet and other lifestyle changes to help control blood sugar, improve cholesterol levels, and manage your blood pressure.  Set weight loss goals  with the help of your health care team. It is recommended that most people with prediabetes lose 7 percent of their current body weight.  Consider following a Mediterranean diet that includes plenty of fresh fruits and vegetables, whole grains, beans, nuts, seeds, fish, lean meat, low-fat dairy, and healthy oils. This information is not intended to replace advice given to you by your health care provider. Make sure you discuss any questions you have with your health care provider. Document Revised: 02/08/2019 Document Reviewed: 12/21/2016 Elsevier Patient Education  2020 [employer].

## 2020-09-01 DIAGNOSIS — G4733 Obstructive sleep apnea (adult) (pediatric): Secondary | ICD-10-CM | POA: Diagnosis not present

## 2020-09-02 DIAGNOSIS — M5416 Radiculopathy, lumbar region: Secondary | ICD-10-CM | POA: Diagnosis not present

## 2020-09-07 ENCOUNTER — Other Ambulatory Visit: Payer: Self-pay

## 2020-09-07 DIAGNOSIS — I10 Essential (primary) hypertension: Secondary | ICD-10-CM

## 2020-09-07 DIAGNOSIS — E782 Mixed hyperlipidemia: Secondary | ICD-10-CM

## 2020-09-07 DIAGNOSIS — R739 Hyperglycemia, unspecified: Secondary | ICD-10-CM

## 2020-09-09 ENCOUNTER — Encounter (INDEPENDENT_AMBULATORY_CARE_PROVIDER_SITE_OTHER): Payer: Self-pay | Admitting: Family Medicine

## 2020-09-09 ENCOUNTER — Ambulatory Visit (INDEPENDENT_AMBULATORY_CARE_PROVIDER_SITE_OTHER): Payer: Medicare HMO | Admitting: Family Medicine

## 2020-09-09 ENCOUNTER — Other Ambulatory Visit: Payer: Self-pay

## 2020-09-09 VITALS — BP 131/69 | HR 66 | Temp 97.7°F | Ht 69.0 in | Wt 212.0 lb

## 2020-09-09 DIAGNOSIS — E559 Vitamin D deficiency, unspecified: Secondary | ICD-10-CM | POA: Diagnosis not present

## 2020-09-09 DIAGNOSIS — E7849 Other hyperlipidemia: Secondary | ICD-10-CM | POA: Diagnosis not present

## 2020-09-09 DIAGNOSIS — E669 Obesity, unspecified: Secondary | ICD-10-CM

## 2020-09-09 DIAGNOSIS — E038 Other specified hypothyroidism: Secondary | ICD-10-CM | POA: Diagnosis not present

## 2020-09-09 DIAGNOSIS — I10 Essential (primary) hypertension: Secondary | ICD-10-CM

## 2020-09-09 DIAGNOSIS — Z6831 Body mass index (BMI) 31.0-31.9, adult: Secondary | ICD-10-CM

## 2020-09-09 DIAGNOSIS — E063 Autoimmune thyroiditis: Secondary | ICD-10-CM | POA: Diagnosis not present

## 2020-09-09 DIAGNOSIS — E782 Mixed hyperlipidemia: Secondary | ICD-10-CM | POA: Diagnosis not present

## 2020-09-09 DIAGNOSIS — R7303 Prediabetes: Secondary | ICD-10-CM

## 2020-09-09 NOTE — Progress Notes (Signed)
Chief Complaint:   OBESITY Orlandis is here to discuss his progress with his obesity treatment plan along with follow-up of his obesity related diagnoses. Papa is on the Category 3 Plan or following a lower carbohydrate, vegetable and lean protein rich diet plan and states he is following his eating plan approximately 50% of the time. Virlan states he is doing pushups, strengthening, 5-7 times per week, and playing golf once.  Today's visit was #: 36 Starting weight: 230 lbs Starting date: 01/18/2017 Today's weight: 212 lbs Today's date: 09/09/2020 Total lbs lost to date: 18 Total lbs lost since last in-office visit: 0  Interim History: Kadarrius has been working on physical therapy to help with his back pain. He has been cleared to play golf again, and he is working on increasing his water intake. He is up 4 lbs but this appears to water retension.   Subjective:   1. Essential hypertension Saveon's blood pressure is well controlled with his medications, diet, and exercise. He denies lightheadedness.  2. Pre-diabetes Jaqualin is working on diet, and he is not on metformin. He is due for labs to be checked. He is not showing signs of hypoglycemia.  3. Other hyperlipidemia Lathon is stable on Lipitor and diet, and he is due to have labs checked. He has had back issues but he denies myalgias.  4. Vitamin D deficiency Hayzen is on OTC Vit D, and he is due to have labs checked.  5. Other specified hypothyroidism Marice is stable on Synthroid, and he is due to have labs checked especially with changes in his weight.  Assessment/Plan:   1. Essential hypertension Awad is working on healthy weight loss and exercise to improve blood pressure control. We will watch for signs of hypotension as he continues his lifestyle modifications. We will check labs today, and he will follow up as directed.  - Comprehensive metabolic panel  2. Pre-diabetes Gram will continue to work on weight  loss, exercise, and decreasing simple carbohydrates to help decrease the risk of diabetes. We will check labs today, and he will follow up as directed.  - Hemoglobin A1c - Insulin, random  3. Other hyperlipidemia Cardiovascular risk and specific lipid/LDL goals reviewed. We discussed several lifestyle modifications today. Hadyn will continue Lipitor, and will continue to work on diet, exercise and weight loss efforts. We will check labs today. Orders and follow up as documented in patient record.   - Lipid Panel With LDL/HDL Ratio  4. Vitamin D deficiency Low Vitamin D level contributes to fatigue and are associated with obesity, breast, and colon cancer. We will check labs today. Granger will continue taking OTC Vitamin D and will follow-up for routine testing of Vitamin D, at least 2-3 times per year to avoid over-replacement.  - VITAMIN D 25 Hydroxy (Vit-D Deficiency, Fractures)  5. Other specified hypothyroidism Patient with long-standing hypothyroidism, and Arvo will continue Synthroid. He appears euthyroid. We will check labs today. Orders and follow up as documented in patient record.  - T3 - T4, free - TSH  6. Class 1 obesity with serious comorbidity and body mass index (BMI) of 31.0 to 31.9 in adult, unspecified obesity type Imre is currently in the action stage of change. As such, his goal is to continue with weight loss efforts. He has agreed to the Category 3 Plan or following a lower carbohydrate, vegetable and lean protein rich diet plan.   Exercise goals: As is.  Behavioral modification strategies: holiday eating strategies .  Divante has agreed to follow-up with our clinic in 4 weeks. He was informed of the importance of frequent follow-up visits to maximize his success with intensive lifestyle modifications for his multiple health conditions.   Kiley was informed we would discuss his lab results at his next visit unless there is a critical issue that needs to be  addressed sooner. Jostin agreed to keep his next visit at the agreed upon time to discuss these results.  Objective:   Blood pressure 131/69, pulse 66, temperature 97.7 F (36.5 C), height 5\' 9"  (1.753 m), weight 212 lb (96.2 kg), SpO2 97 %. Body mass index is 31.31 kg/m.  General: Cooperative, alert, well developed, in no acute distress. HEENT: Conjunctivae and lids unremarkable. Cardiovascular: Regular rhythm.  Lungs: Normal work of breathing. Neurologic: No focal deficits.   Lab Results  Component Value Date   CREATININE 0.98 03/02/2020   BUN 18 03/02/2020   NA 144 03/02/2020   K 4.6 03/02/2020   CL 103 03/02/2020   CO2 26 03/02/2020   Lab Results  Component Value Date   ALT 24 03/02/2020   AST 24 03/02/2020   ALKPHOS 56 03/02/2020   BILITOT 0.8 03/02/2020   Lab Results  Component Value Date   HGBA1C 5.8 (H) 03/02/2020   HGBA1C 5.6 10/14/2019   HGBA1C 5.8 (H) 12/10/2018   HGBA1C 5.8 (H) 06/06/2018   HGBA1C 5.6 08/14/2017   Lab Results  Component Value Date   INSULIN 8.3 03/02/2020   INSULIN 14.1 10/14/2019   INSULIN 15.4 12/10/2018   INSULIN 9.6 06/06/2018   INSULIN 10.8 08/14/2017   Lab Results  Component Value Date   TSH 4.890 (H) 04/27/2020   Lab Results  Component Value Date   CHOL 131 10/14/2019   HDL 57 10/14/2019   LDLCALC 60 10/14/2019   LDLDIRECT 68.0 04/13/2015   TRIG 68 10/14/2019   CHOLHDL 2.9 09/12/2018   Lab Results  Component Value Date   WBC 3.5 (L) 04/24/2020   HGB 14.4 04/24/2020   HCT 42.4 04/24/2020   MCV 96.2 04/24/2020   PLT 131.0 (L) 04/24/2020   No results found for: IRON, TIBC, FERRITIN  Obesity Behavioral Intervention:   Approximately 15 minutes were spent on the discussion below.  ASK: We discussed the diagnosis of obesity with [name] today and Jaxsen agreed to give [place] permission to discuss obesity behavioral modification therapy today.  ASSESS: Wilmon has the diagnosis of obesity and his BMI today is 31.29.  Kionte is in the action stage of change.   ADVISE: Markham was educated on the multiple health risks of obesity as well as the benefit of weight loss to improve his health. He was advised of the need for long term treatment and the importance of lifestyle modifications to improve his current health and to decrease his risk of future health problems.  AGREE: Multiple dietary modification options and treatment options were discussed and Rickardo agreed to follow the recommendations documented in the above note.  ARRANGE: Aldrich was educated on the importance of frequent visits to treat obesity as outlined per CMS and USPSTF guidelines and agreed to schedule his next follow up appointment today.  Attestation Statements:   Reviewed by clinician on day of visit: allergies, medications, problem list, medical history, surgical history, family history, social history, and previous encounter notes.   I, Trixie Dredge, am acting as transcriptionist for [name], MD.  I have reviewed the above documentation for accuracy and completeness, and I agree with the above. -  [name], MD

## 2020-09-10 ENCOUNTER — Other Ambulatory Visit: Payer: Self-pay | Admitting: Physician Assistant

## 2020-09-10 DIAGNOSIS — M5416 Radiculopathy, lumbar region: Secondary | ICD-10-CM | POA: Diagnosis not present

## 2020-09-10 LAB — TSH: TSH: 3.52 u[IU]/mL (ref 0.450–4.500)

## 2020-09-10 LAB — COMPREHENSIVE METABOLIC PANEL
ALT: 27 IU/L (ref 0–44)
AST: 32 IU/L (ref 0–40)
Albumin/Globulin Ratio: 2.1 (ref 1.2–2.2)
Albumin: 4.7 g/dL (ref 3.7–4.7)
Alkaline Phosphatase: 65 IU/L (ref 44–121)
BUN/Creatinine Ratio: 17 (ref 10–24)
BUN: 15 mg/dL (ref 8–27)
Bilirubin Total: 0.8 mg/dL (ref 0.0–1.2)
CO2: 27 mmol/L (ref 20–29)
Calcium: 9.8 mg/dL (ref 8.6–10.2)
Chloride: 103 mmol/L (ref 96–106)
Creatinine, Ser: 0.89 mg/dL (ref 0.76–1.27)
GFR calc Af Amer: 93 mL/min/{1.73_m2} (ref >59)
GFR calc non Af Amer: 81 mL/min/{1.73_m2} (ref >59)
Globulin, Total: 2.2 g/dL (ref 1.5–4.5)
Glucose: 103 mg/dL — ABNORMAL HIGH (ref 65–99)
Potassium: 5.2 mmol/L (ref 3.5–5.2)
Sodium: 141 mmol/L (ref 134–144)
Total Protein: 6.9 g/dL (ref 6.0–8.5)

## 2020-09-10 LAB — LIPID PANEL WITH LDL/HDL RATIO
Cholesterol, Total: 137 mg/dL (ref 100–199)
HDL: 49 mg/dL (ref >39)
LDL Chol Calc (NIH): 66 mg/dL (ref 0–99)
LDL/HDL Ratio: 1.3 ratio (ref 0.0–3.6)
Triglycerides: 124 mg/dL (ref 0–149)
VLDL Cholesterol Cal: 22 mg/dL (ref 5–40)

## 2020-09-10 LAB — HEMOGLOBIN A1C
Est. average glucose Bld gHb Est-mCnc: 117 mg/dL
Hgb A1c MFr Bld: 5.7 % — ABNORMAL HIGH (ref 4.8–5.6)

## 2020-09-10 LAB — T3: T3, Total: 97 ng/dL (ref 71–180)

## 2020-09-10 LAB — T4, FREE: Free T4: 1.12 ng/dL (ref 0.82–1.77)

## 2020-09-10 LAB — VITAMIN D 25 HYDROXY (VIT D DEFICIENCY, FRACTURES): Vit D, 25-Hydroxy: 58.4 ng/mL (ref 30.0–100.0)

## 2020-09-10 LAB — INSULIN, RANDOM: INSULIN: 14.2 u[IU]/mL (ref 2.6–24.9)

## 2020-09-11 ENCOUNTER — Other Ambulatory Visit: Payer: Self-pay | Admitting: *Deleted

## 2020-09-11 MED ORDER — AMLODIPINE BESYLATE 10 MG PO TABS: 10.0000 mg | ORAL_TABLET | Freq: Every day | ORAL | 3 refills | Status: DC

## 2020-09-11 NOTE — Telephone Encounter (Signed)
Called and made patient aware that the amlodipine 10 mg QD Rx has been sent to [facility]. Patient verbalized understanding and thanked me for the call.

## 2020-09-11 NOTE — Telephone Encounter (Signed)
OK to continue amlodipine 10 mg daily.   JV

## 2020-09-11 NOTE — Telephone Encounter (Signed)
Patient called the office requesting a refill on Amlodipine. He stated that he was taking Amlodipine 5mg , and that  "Dr. Irish Lack had increased Amlodipine 10mg ." I reviewed the last office visit and didn't see a change made. It is on his medication list that Dr. Larose Kells had ordered Amlodipine 10 mg. Please advise if Dr. Irish Lack agrees with the medication dosage change. Thank you.

## 2020-09-15 DIAGNOSIS — M5416 Radiculopathy, lumbar region: Secondary | ICD-10-CM | POA: Diagnosis not present

## 2020-09-16 DIAGNOSIS — Z85828 Personal history of other malignant neoplasm of skin: Secondary | ICD-10-CM | POA: Diagnosis not present

## 2020-09-16 DIAGNOSIS — L570 Actinic keratosis: Secondary | ICD-10-CM | POA: Diagnosis not present

## 2020-09-16 DIAGNOSIS — L814 Other melanin hyperpigmentation: Secondary | ICD-10-CM | POA: Diagnosis not present

## 2020-09-16 DIAGNOSIS — L821 Other seborrheic keratosis: Secondary | ICD-10-CM | POA: Diagnosis not present

## 2020-09-16 DIAGNOSIS — L905 Scar conditions and fibrosis of skin: Secondary | ICD-10-CM | POA: Diagnosis not present

## 2020-09-16 DIAGNOSIS — C44519 Basal cell carcinoma of skin of other part of trunk: Secondary | ICD-10-CM | POA: Diagnosis not present

## 2020-09-16 DIAGNOSIS — D229 Melanocytic nevi, unspecified: Secondary | ICD-10-CM | POA: Diagnosis not present

## 2020-09-16 DIAGNOSIS — L819 Disorder of pigmentation, unspecified: Secondary | ICD-10-CM | POA: Diagnosis not present

## 2020-09-16 DIAGNOSIS — D1801 Hemangioma of skin and subcutaneous tissue: Secondary | ICD-10-CM | POA: Diagnosis not present

## 2020-09-16 DIAGNOSIS — D485 Neoplasm of uncertain behavior of skin: Secondary | ICD-10-CM | POA: Diagnosis not present

## 2020-09-16 DIAGNOSIS — C44719 Basal cell carcinoma of skin of left lower limb, including hip: Secondary | ICD-10-CM | POA: Diagnosis not present

## 2020-09-17 DIAGNOSIS — M5416 Radiculopathy, lumbar region: Secondary | ICD-10-CM | POA: Diagnosis not present

## 2020-09-19 ENCOUNTER — Ambulatory Visit
Admission: RE | Admit: 2020-09-19 | Discharge: 2020-09-19 | Disposition: A | Discharge status: Home / Self Care | Payer: Medicare HMO | Source: Ambulatory Visit | Attending: Urology | Admitting: Urology

## 2020-09-19 DIAGNOSIS — R972 Elevated prostate specific antigen [PSA]: Secondary | ICD-10-CM

## 2020-09-19 DIAGNOSIS — N402 Nodular prostate without lower urinary tract symptoms: Secondary | ICD-10-CM

## 2020-09-19 MED ORDER — GADOBENATE DIMEGLUMINE 529 MG/ML IV SOLN
20.0000 mL | Freq: Once | INTRAVENOUS | Status: AC | PRN
  Administered 2020-09-19: 20 mL via INTRAVENOUS

## 2020-09-23 DIAGNOSIS — M5416 Radiculopathy, lumbar region: Secondary | ICD-10-CM | POA: Diagnosis not present

## 2020-09-28 ENCOUNTER — Telehealth: Payer: Self-pay | Admitting: Pharmacist

## 2020-09-28 NOTE — Progress Notes (Addendum)
Chronic Care Management Pharmacy Assistant   Name: Philip [name]  MRN: [id number] DOB: Oct 09, 1941  Reason for Encounter: Disease State  Patient Questions:  1.  Have you seen any other providers since your last visit? No  2.  Any changes in your medicines or health? No  PCP : Colon Branch, MD   Their chronic conditions include: Hypertension, Hyperlipidemia/CAD, Pre-Diabetes, AFib, Hypothyroidism, Chronic Constipation, Elevated PSA  Consult Visit 09-09-20([facility])  Patient presented in office with Philip [name] for a follow up.  Labs ordered.  No medication changes indicated.    No Office visits or Hospitalizations since last CCM call on 08-25-20.  Allergies:  No Known Allergies  Medications: Outpatient Encounter Medications as of 09/28/2020  Medication Sig   amLODipine (NORVASC) 10 MG tablet Take 1 tablet (10 mg total) by mouth daily.   atorvastatin (LIPITOR) 40 MG tablet TAKE 1 TABLET (40 MG TOTAL) BY MOUTH AT BEDTIME.   B Complex-Biotin-FA (B-COMPLEX PO) Take 1 tablet by mouth daily.   Cholecalciferol (VITAMIN D3) 5000 UNITS CAPS Take 5,000 Units by mouth daily.   co-enzyme Q-10 30 MG capsule Take 30 mg by mouth daily.   ELIQUIS 5 MG TABS tablet TAKE 1 TABLET TWICE DAILY   hydrocortisone 2.5 % cream Apply 1 application topically as needed.   levothyroxine (SYNTHROID) 25 MCG tablet Take 1 tablet (25 mcg total) by mouth daily before breakfast.   lisinopril (ZESTRIL) 20 MG tablet Take 1 tablet (20 mg total) by mouth daily.   magnesium oxide (MAG-OX) 400 MG tablet Take 800 mg by mouth daily.   Multiple Vitamins-Minerals (MULTIVITAMIN WITH MINERALS) tablet Take 1 tablet by mouth daily.   Omega-3 Fatty Acids (OMEGA-3 FISH OIL) 1200 MG CAPS Take 2 1,200 mg capsules daily   polyethylene glycol (MIRALAX / GLYCOLAX) packet Take 17 g by mouth daily.   No facility-administered encounter medications on file as of 09/28/2020.    Current Diagnosis: Patient Active Problem List    Diagnosis Date Noted   Skin cancer of face 08/05/2020   Depression 04/02/2019   Lower extremity edema 12/22/2017   Coronary artery disease involving native coronary artery of native heart without angina pectoris 12/22/2017   Other specified hypothyroidism 10/02/2017   Osteoarthritis of left hip 06/26/2017   Class 1 obesity with serious comorbidity and body mass index (BMI) of 30.0 to 30.9 in adult 05/08/2017   Hyperglycemia 04/19/2017   Plantar fasciitis of left foot 04/11/2017   Vitamin D deficiency 03/29/2017   Idiopathic peripheral neuropathy 03/13/2017   Erectile dysfunction 02/22/2017   PCP NOTES >>>>>>>>>>>>>>>>>>>>>>>>>>>>>>>>>>>>>>> 12/21/2015   Chronic constipation 12/21/2015   Atrial fibrillation ([place]) 05/07/2014   Encounter for pre-operative cardiovascular clearance 05/07/2014   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 Spoke with patient regarding BP  Recent Office Vitals: BP Readings from Last 3 Encounters:  09/09/20 131/69  08/26/20 127/71  08/19/20 117/70   Pulse Readings from Last 3 Encounters:  09/09/20 66  08/26/20 88  08/19/20 62    Wt Readings from Last 3 Encounters:  09/09/20 212 lb (96.2 kg)  08/26/20 220 lb (99.8  kg)  08/19/20 219 lb (99.3 kg)     Kidney Function Lab Results  Component Value Date/Time   CREATININE 0.89 09/09/2020 08:42 AM   CREATININE 0.98 03/02/2020 12:50 PM   CREATININE 1.08 12/14/2015 11:22 AM   CREATININE 1.20 02/03/2015 01:50 PM   GFR 76.49 04/26/2019 01:41 PM   GFRNONAA 81 09/09/2020 08:42 AM   GFRAA 93 09/09/2020 08:42 AM    BMP Latest  Ref Rng & Units 09/09/2020 03/02/2020 10/14/2019  Glucose 65 - 99 mg/dL 103(H) 94 103(H)  BUN 8 - 27 mg/dL 15 18 21   Creatinine 0.76 - 1.27 mg/dL 0.89 0.98 1.00  BUN/Creat Ratio 10 - 24 17 18 21   Sodium 134 - 144 mmol/L 141 144 141  Potassium 3.5 - 5.2 mmol/L 5.2 4.6 4.4  Chloride 96 - 106 mmol/L 103 103 103  CO2 20 - 29 mmol/L 27 26 24   Calcium 8.6 - 10.2 mg/dL 9.8 10.1 9.5    Current antihypertensive regimen:  Amlodipine 10mg  daily PM Lisinopril 20mg  daily AM How often are you checking your Blood Pressure? 3-5x per week Current home BP readings:     83-41(962/22) 08/30/20(128/67)  97-98(921/19)  41-74(081/44)  81-85(631/49) What recent interventions/DTPs have been made by any provider to improve Blood Pressure control since last CPP Visit: None Any recent hospitalizations or ED visits since last visit with CPP? No What diet changes have been made to improve Blood Pressure Control?  Reports he does have meals plans.  Reports "he tries to get plenty of protein and carbs down." What exercise is being done to improve your Blood Pressure Control?  Reports he does physical therapy due to back pain.  Adherence Review: Is the patient currently on ACE/ARB medication? Yes Lisinopril 20 mg  Does the patient have >5 day gap between last estimated fill dates? No    Follow-Up:  Pharmacist Review   [place] Shannon, [facility] [place] Pharmacist Assistant [phone]  Reviewed by: De Blanch, PharmD, BCACP Clinical Pharmacist [facility] [phone]

## 2020-09-30 DIAGNOSIS — M5416 Radiculopathy, lumbar region: Secondary | ICD-10-CM | POA: Diagnosis not present

## 2020-10-02 DIAGNOSIS — M5416 Radiculopathy, lumbar region: Secondary | ICD-10-CM | POA: Diagnosis not present

## 2020-10-05 DIAGNOSIS — H3581 Retinal edema: Secondary | ICD-10-CM | POA: Diagnosis not present

## 2020-10-05 DIAGNOSIS — M5416 Radiculopathy, lumbar region: Secondary | ICD-10-CM | POA: Diagnosis not present

## 2020-10-05 DIAGNOSIS — H318 Other specified disorders of choroid: Secondary | ICD-10-CM | POA: Diagnosis not present

## 2020-10-05 DIAGNOSIS — H40013 Open angle with borderline findings, low risk, bilateral: Secondary | ICD-10-CM | POA: Diagnosis not present

## 2020-10-06 DIAGNOSIS — C44719 Basal cell carcinoma of skin of left lower limb, including hip: Secondary | ICD-10-CM | POA: Diagnosis not present

## 2020-10-06 DIAGNOSIS — C44519 Basal cell carcinoma of skin of other part of trunk: Secondary | ICD-10-CM | POA: Diagnosis not present

## 2020-10-07 ENCOUNTER — Encounter (INDEPENDENT_AMBULATORY_CARE_PROVIDER_SITE_OTHER): Payer: Self-pay | Admitting: Family Medicine

## 2020-10-07 ENCOUNTER — Other Ambulatory Visit: Payer: Self-pay

## 2020-10-07 ENCOUNTER — Ambulatory Visit (INDEPENDENT_AMBULATORY_CARE_PROVIDER_SITE_OTHER): Payer: Medicare HMO | Admitting: Family Medicine

## 2020-10-07 VITALS — BP 127/71 | HR 71 | Temp 97.8°F | Ht 69.0 in | Wt 218.0 lb

## 2020-10-07 DIAGNOSIS — E039 Hypothyroidism, unspecified: Secondary | ICD-10-CM | POA: Diagnosis not present

## 2020-10-07 DIAGNOSIS — E669 Obesity, unspecified: Secondary | ICD-10-CM | POA: Diagnosis not present

## 2020-10-07 DIAGNOSIS — Z6832 Body mass index (BMI) 32.0-32.9, adult: Secondary | ICD-10-CM | POA: Diagnosis not present

## 2020-10-07 MED ORDER — LEVOTHYROXINE SODIUM 25 MCG PO TABS: 25.0000 ug | ORAL_TABLET | Freq: Every day | ORAL | 0 refills | Status: DC

## 2020-10-08 NOTE — Progress Notes (Signed)
Chief Complaint:   OBESITY Philip [name] is here to discuss his progress with his obesity treatment plan along with follow-up of his obesity related diagnoses. Philip [name] is on the Category 3 Plan or following a lower carbohydrate, vegetable and lean protein rich diet plan and states he is following his eating plan approximately 50% of the time. Philip [name] states he is doing 0 minutes 0 times per week.  Today's visit was #: 57 Starting weight: 230 lbs Starting date: 01/18/2017 Today's weight: 218 lbs Today's date: 10/07/2020 Total lbs lost to date: 12 Total lbs lost since last in-office visit: 0  Interim History: Philip [name] has been off track with his weight due to the holidays and multiple health issues, which has limited his activity significantly. He will be doing some celebration eating over Christmas.  Subjective:   1. Hypothyroidism, unspecified type Philip [name] is stable on his medications, and he denies symptoms. He requests a refill today. His last labs were within normal limits.  Assessment/Plan:   1. Hypothyroidism, unspecified type Philip [name] is with long-standing hypothyroidism. We will refill Synthroid for 1 month. He appears euthyroid. Orders and follow up as documented in patient record.  - levothyroxine (SYNTHROID) 25 MCG tablet; Take 1 tablet (25 mcg total) by mouth daily before breakfast.  Dispense: 30 tablet; Refill: 0  2. Class 1 obesity with serious comorbidity and body mass index (BMI) of 32.0 to 32.9 in adult, unspecified obesity type Philip [name] is currently in the action stage of change. As such, his goal is to continue with weight loss efforts. He has agreed to the Category 3 Plan.   Exercise goals: Philip [name] cushin exercises were discussed.  Behavioral modification strategies: increasing lean protein intake and meal planning and cooking strategies.  Philip [name] has agreed to follow-up with our clinic in 4 to 6 weeks. He was informed of the importance of frequent follow-up visits to  maximize his success with intensive lifestyle modifications for his multiple health conditions.   Objective:   Blood pressure 127/71, pulse 71, temperature 97.8 F (36.6 C), height 5\' 9"  (1.753 m), weight 218 lb (98.9 kg), SpO2 98 %. Body mass index is 32.19 kg/m.  General: Cooperative, alert, well developed, in no acute distress. HEENT: Conjunctivae and lids unremarkable. Cardiovascular: Regular rhythm.  Lungs: Normal work of breathing. Neurologic: No focal deficits.   Lab Results  Component Value Date   CREATININE 0.89 09/09/2020   BUN 15 09/09/2020   NA 141 09/09/2020   K 5.2 09/09/2020   CL 103 09/09/2020   CO2 27 09/09/2020   Lab Results  Component Value Date   ALT 27 09/09/2020   AST 32 09/09/2020   ALKPHOS 65 09/09/2020   BILITOT 0.8 09/09/2020   Lab Results  Component Value Date   HGBA1C 5.7 (H) 09/09/2020   HGBA1C 5.8 (H) 03/02/2020   HGBA1C 5.6 10/14/2019   HGBA1C 5.8 (H) 12/10/2018   HGBA1C 5.8 (H) 06/06/2018   Lab Results  Component Value Date   INSULIN 14.2 09/09/2020   INSULIN 8.3 03/02/2020   INSULIN 14.1 10/14/2019   INSULIN 15.4 12/10/2018   INSULIN 9.6 06/06/2018   Lab Results  Component Value Date   TSH 3.520 09/09/2020   Lab Results  Component Value Date   CHOL 137 09/09/2020   HDL 49 09/09/2020   LDLCALC 66 09/09/2020   LDLDIRECT 68.0 04/13/2015   TRIG 124 09/09/2020   CHOLHDL 2.9 09/12/2018   Lab Results  Component Value Date   WBC 3.5 (Philip) 04/24/2020  HGB 14.4 04/24/2020   HCT 42.4 04/24/2020   MCV 96.2 04/24/2020   PLT 131.0 (Philip) 04/24/2020   No results found for: IRON, TIBC, FERRITIN  Attestation Statements:   Reviewed by clinician on day of visit: allergies, medications, problem list, medical history, surgical history, family history, social history, and previous encounter notes.  Time spent on visit including pre-visit chart review and post-visit care and charting was 30 minutes.    I, Trixie Dredge, am acting as  transcriptionist for [name], MD.  I have reviewed the above documentation for accuracy and completeness, and I agree with the above. -  [name], MD

## 2020-10-09 DIAGNOSIS — M545 Low back pain, unspecified: Secondary | ICD-10-CM | POA: Diagnosis not present

## 2020-10-13 DIAGNOSIS — D0361 Melanoma in situ of right upper limb, including shoulder: Secondary | ICD-10-CM | POA: Diagnosis not present

## 2020-10-13 DIAGNOSIS — D485 Neoplasm of uncertain behavior of skin: Secondary | ICD-10-CM | POA: Diagnosis not present

## 2020-10-13 DIAGNOSIS — C44519 Basal cell carcinoma of skin of other part of trunk: Secondary | ICD-10-CM | POA: Diagnosis not present

## 2020-10-15 DIAGNOSIS — C4361 Malignant melanoma of right upper limb, including shoulder: Secondary | ICD-10-CM | POA: Diagnosis not present

## 2020-10-27 DIAGNOSIS — M5416 Radiculopathy, lumbar region: Secondary | ICD-10-CM | POA: Diagnosis not present

## 2020-11-02 DIAGNOSIS — C44519 Basal cell carcinoma of skin of other part of trunk: Secondary | ICD-10-CM | POA: Diagnosis not present

## 2020-11-02 DIAGNOSIS — C4441 Basal cell carcinoma of skin of scalp and neck: Secondary | ICD-10-CM | POA: Diagnosis not present

## 2020-11-03 DIAGNOSIS — C44612 Basal cell carcinoma of skin of right upper limb, including shoulder: Secondary | ICD-10-CM | POA: Diagnosis not present

## 2020-11-03 DIAGNOSIS — C4441 Basal cell carcinoma of skin of scalp and neck: Secondary | ICD-10-CM | POA: Diagnosis not present

## 2020-11-04 ENCOUNTER — Ambulatory Visit (INDEPENDENT_AMBULATORY_CARE_PROVIDER_SITE_OTHER): Payer: Medicare HMO | Admitting: Family Medicine

## 2020-11-05 ENCOUNTER — Other Ambulatory Visit (INDEPENDENT_AMBULATORY_CARE_PROVIDER_SITE_OTHER): Payer: Self-pay | Admitting: Family Medicine

## 2020-11-05 ENCOUNTER — Encounter (INDEPENDENT_AMBULATORY_CARE_PROVIDER_SITE_OTHER): Payer: Self-pay

## 2020-11-05 DIAGNOSIS — E039 Hypothyroidism, unspecified: Secondary | ICD-10-CM

## 2020-11-05 NOTE — Telephone Encounter (Signed)
MyChart message sent to pt to find out if they have enough medication to get them through until next appt.   

## 2020-11-05 NOTE — Telephone Encounter (Signed)
Dr.Beasley 

## 2020-11-09 ENCOUNTER — Other Ambulatory Visit (INDEPENDENT_AMBULATORY_CARE_PROVIDER_SITE_OTHER): Payer: Self-pay | Admitting: Family Medicine

## 2020-11-09 DIAGNOSIS — E039 Hypothyroidism, unspecified: Secondary | ICD-10-CM

## 2020-11-09 DIAGNOSIS — L929 Granulomatous disorder of the skin and subcutaneous tissue, unspecified: Secondary | ICD-10-CM | POA: Diagnosis not present

## 2020-11-09 MED ORDER — LEVOTHYROXINE SODIUM 25 MCG PO TABS: 25.0000 ug | ORAL_TABLET | Freq: Every day | ORAL | 0 refills | Status: DC

## 2020-11-09 NOTE — Telephone Encounter (Signed)
Medication Refill Request   Medication being requested:  Euthyrox Medication was last refilled on: 10/07/20 Did [facility] prescribe last? Yes Last visit: 10/07/20 When was pt instructed to f/u? 6 weeks When is next scheduled f/u? Does not have one Last labs and results associated with medication: 09/09/20 TSH- 4.89 Free T4- 1.12 T3- 97        Documentation

## 2020-11-09 NOTE — Telephone Encounter (Signed)
Medication Refill Request  Medication being requested:  Euthyrox Medication was last refilled on: 10/07/20 Did [facility] prescribe last? Yes Last visit: 10/07/20 When was pt instructed to f/u? 6 weeks When is next scheduled f/u? Does not have one Last labs and results associated with medication: 09/09/20 TSH- 4.89 Free T4- 1.12 T3- 97

## 2020-11-11 DIAGNOSIS — L570 Actinic keratosis: Secondary | ICD-10-CM | POA: Diagnosis not present

## 2020-11-11 DIAGNOSIS — L821 Other seborrheic keratosis: Secondary | ICD-10-CM | POA: Diagnosis not present

## 2020-11-11 DIAGNOSIS — D229 Melanocytic nevi, unspecified: Secondary | ICD-10-CM | POA: Diagnosis not present

## 2020-11-11 DIAGNOSIS — Z85820 Personal history of malignant melanoma of skin: Secondary | ICD-10-CM | POA: Diagnosis not present

## 2020-11-11 DIAGNOSIS — Z85828 Personal history of other malignant neoplasm of skin: Secondary | ICD-10-CM | POA: Diagnosis not present

## 2020-11-11 DIAGNOSIS — L905 Scar conditions and fibrosis of skin: Secondary | ICD-10-CM | POA: Diagnosis not present

## 2020-11-13 ENCOUNTER — Other Ambulatory Visit: Payer: Self-pay | Admitting: Interventional Cardiology

## 2020-11-13 DIAGNOSIS — I25118 Atherosclerotic heart disease of native coronary artery with other forms of angina pectoris: Secondary | ICD-10-CM

## 2020-11-18 DIAGNOSIS — M5459 Other low back pain: Secondary | ICD-10-CM | POA: Diagnosis not present

## 2020-11-24 ENCOUNTER — Telehealth: Payer: Self-pay

## 2020-11-24 ENCOUNTER — Other Ambulatory Visit: Payer: Self-pay | Admitting: Orthopedic Surgery

## 2020-11-24 DIAGNOSIS — M79604 Pain in right leg: Secondary | ICD-10-CM

## 2020-11-24 DIAGNOSIS — M79605 Pain in left leg: Secondary | ICD-10-CM

## 2020-11-24 NOTE — Telephone Encounter (Signed)
Screened for myelogram

## 2020-12-02 DIAGNOSIS — L905 Scar conditions and fibrosis of skin: Secondary | ICD-10-CM | POA: Diagnosis not present

## 2020-12-02 DIAGNOSIS — C4359 Malignant melanoma of other part of trunk: Secondary | ICD-10-CM | POA: Diagnosis not present

## 2020-12-02 DIAGNOSIS — C44519 Basal cell carcinoma of skin of other part of trunk: Secondary | ICD-10-CM | POA: Diagnosis not present

## 2020-12-02 DIAGNOSIS — L989 Disorder of the skin and subcutaneous tissue, unspecified: Secondary | ICD-10-CM | POA: Diagnosis not present

## 2020-12-04 ENCOUNTER — Other Ambulatory Visit: Payer: Self-pay | Admitting: Orthopedic Surgery

## 2020-12-04 ENCOUNTER — Ambulatory Visit
Admission: RE | Admit: 2020-12-04 | Discharge: 2020-12-04 | Disposition: A | Discharge status: Home / Self Care | Payer: Self-pay | Source: Ambulatory Visit | Attending: Orthopedic Surgery | Admitting: Orthopedic Surgery

## 2020-12-04 DIAGNOSIS — R52 Pain, unspecified: Secondary | ICD-10-CM

## 2020-12-07 ENCOUNTER — Ambulatory Visit
Admission: RE | Admit: 2020-12-07 | Discharge: 2020-12-07 | Disposition: A | Discharge status: Home / Self Care | Payer: Medicare HMO | Source: Ambulatory Visit | Attending: Orthopedic Surgery | Admitting: Orthopedic Surgery

## 2020-12-07 ENCOUNTER — Other Ambulatory Visit: Payer: Self-pay

## 2020-12-07 DIAGNOSIS — M5126 Other intervertebral disc displacement, lumbar region: Secondary | ICD-10-CM | POA: Diagnosis not present

## 2020-12-07 DIAGNOSIS — M48061 Spinal stenosis, lumbar region without neurogenic claudication: Secondary | ICD-10-CM | POA: Diagnosis not present

## 2020-12-07 DIAGNOSIS — M79605 Pain in left leg: Secondary | ICD-10-CM

## 2020-12-07 DIAGNOSIS — M79604 Pain in right leg: Secondary | ICD-10-CM

## 2020-12-07 DIAGNOSIS — M4316 Spondylolisthesis, lumbar region: Secondary | ICD-10-CM | POA: Diagnosis not present

## 2020-12-07 MED ORDER — IOPAMIDOL (ISOVUE-M 200) INJECTION 41%
20.0000 mL | Freq: Once | INTRAMUSCULAR | Status: AC
  Administered 2020-12-07: 20 mL via INTRATHECAL

## 2020-12-07 MED ORDER — DIAZEPAM 5 MG PO TABS
5.0000 mg | ORAL_TABLET | Freq: Once | ORAL | Status: AC
  Administered 2020-12-07: 5 mg via ORAL

## 2020-12-07 NOTE — Discharge Instructions (Signed)

## 2020-12-16 ENCOUNTER — Other Ambulatory Visit: Payer: Self-pay | Admitting: Interventional Cardiology

## 2020-12-16 DIAGNOSIS — I2510 Atherosclerotic heart disease of native coronary artery without angina pectoris: Secondary | ICD-10-CM

## 2020-12-16 DIAGNOSIS — M5451 Vertebrogenic low back pain: Secondary | ICD-10-CM | POA: Diagnosis not present

## 2020-12-16 NOTE — Telephone Encounter (Signed)
Eliquis 5mg  refill request received. Patient is 80 years old, weight-98.9kg, Crea-0.89 on 09/09/2020, Diagnosis-Afib, and last seen by Dr. Irish Lack on 02/17/2020. Dose is appropriate based on dosing criteria. Will send in refill to requested pharmacy.

## 2020-12-18 ENCOUNTER — Encounter: Payer: Self-pay | Admitting: Internal Medicine

## 2020-12-22 ENCOUNTER — Telehealth: Payer: Self-pay

## 2020-12-22 ENCOUNTER — Telehealth: Payer: Self-pay | Admitting: Pharmacist

## 2020-12-22 NOTE — Telephone Encounter (Signed)
Lower legs are swollen and he has some tingling.

## 2020-12-22 NOTE — Telephone Encounter (Signed)
Patient has been having leg pain after walking a few hundred feet since June. He is s/p EVAR in 2015. His symptoms began prior to last CT scan. The symptoms were thought to be due to a herniated disc, but scan shows that it is not severe enough to cause hip and leg pain after walking. Discussed with Stanford Breed and will bring in patient for ABIs and PA follow up.

## 2020-12-22 NOTE — Progress Notes (Addendum)
Chronic Care Management Pharmacy Assistant   Name: Philip [name]  MRN: [id number] DOB: Oct 10, 1941  Reason for Encounter: Disease State For CHL.  Patient Questions:  1.  Have you seen any other providers since your last visit? Yes    2.  Any changes in your medicines or health? No.  PCP : Philip Branch, MD   Their chronic conditions include: Hypertension, Hyperlipidemia/CAD, Pre-Diabetes, AFib, Hypothyroidism, Chronic Constipation, Elevated PSA.  Office Visits: None since 09/28/20  Consults: 10/09/20 Orthopedic Surgery. Philip [name]. Low back pain. No information given. 10/07/20 Fam Med Philip Skeans, MD. [facility]. No medication changes.  Allergies:  No Known Allergies  Medications: Outpatient Encounter Medications as of 12/22/2020  Medication Sig   amLODipine (NORVASC) 10 MG tablet Take 1 tablet (10 mg total) by mouth daily.   atorvastatin (LIPITOR) 40 MG tablet TAKE 1 TABLET (40 MG TOTAL) BY MOUTH AT BEDTIME.   B Complex-Biotin-FA (B-COMPLEX PO) Take 1 tablet by mouth daily.   Cholecalciferol (VITAMIN D3) 5000 UNITS CAPS Take 5,000 Units by mouth daily. (Patient not taking: Reported on 11/24/2020)   clobetasol cream (TEMOVATE) 7.02 % Apply 1 application topically 2 (two) times daily.   co-enzyme Q-10 30 MG capsule Take 30 mg by mouth daily.   ELIQUIS 5 MG TABS tablet TAKE 1 TABLET TWICE DAILY   hydrocortisone 2.5 % cream Apply 1 application topically as needed.   levothyroxine (SYNTHROID) 25 MCG tablet Take 1 tablet (25 mcg total) by mouth daily before breakfast.   lisinopril (ZESTRIL) 20 MG tablet Take 1 tablet (20 mg total) by mouth daily. Please make yearly appt with Dr. Irish Lack for April 2022 for future refills. Thank you 1st attempt   magnesium oxide (MAG-OX) 400 MG tablet Take 800 mg by mouth daily.   Multiple Vitamins-Minerals (MULTIVITAMIN WITH MINERALS) tablet Take 1 tablet by mouth daily.   Omega-3 Fatty Acids (OMEGA-3 FISH OIL) 1200 MG  CAPS Take 2 1,200 mg capsules daily   polyethylene glycol (MIRALAX / GLYCOLAX) packet Take 17 g by mouth daily.   predniSONE (DELTASONE) 10 MG tablet Take 10 mg by mouth daily. (Patient not taking: Reported on 11/24/2020)   No facility-administered encounter medications on file as of 12/22/2020.    Current Diagnosis: Patient Active Problem List   Diagnosis Date Noted   Skin cancer of face 08/05/2020   Depression 04/02/2019   Lower extremity edema 12/22/2017   Coronary artery disease involving native coronary artery of native heart without angina pectoris 12/22/2017   Other specified hypothyroidism 10/02/2017   Osteoarthritis of left hip 06/26/2017   Class 1 obesity with serious comorbidity and body mass index (BMI) of 30.0 to 30.9 in adult 05/08/2017   Hyperglycemia 04/19/2017   Plantar fasciitis of left foot 04/11/2017   Vitamin D deficiency 03/29/2017   Idiopathic peripheral neuropathy 03/13/2017   Erectile dysfunction 02/22/2017   PCP NOTES >>>>>>>>>>>>>>>>>>>>>>>>>>>>>>>>>>>>>>> 12/21/2015   Chronic constipation 12/21/2015   Atrial fibrillation (Philip [name]) 05/07/2014   Encounter for pre-operative cardiovascular clearance 05/07/2014   AAA (abdominal aortic aneurysm) without rupture (Philip [name]) 04/30/2014   Fatigue 11/25/2013   Hypomagnesemia 06/29/2011   Annual physical exam 02/16/2011   Insomnia 02/16/2011   OSA (obstructive sleep apnea)    History of skin cancer 07/24/2008   Coronary atherosclerosis 07/04/2008   Mixed hyperlipidemia 02/08/2007   Essential hypertension 02/08/2007    Goals Addressed   None    12/22/2020 Name: Philip [name] MRN: [id number] DOB: Nov 18, 1940 Philip [name]  is a 80 y.o. year old male who is a primary care patient of Philip Branch, MD.  Comprehensive medication review performed; Spoke to patient regarding cholesterol  Lipid Panel    Component Value Date/Time   CHOL 137 09/09/2020 0842   TRIG 124 09/09/2020 0842   TRIG 256 09/14/2009 0000   HDL 49  09/09/2020 0842   LDLCALC 66 09/09/2020 0842   LDLDIRECT 68.0 04/13/2015 0823    10-year ASCVD risk score: The ASCVD Risk score Mikey Bussing DC Jr., et al., 2013) failed to calculate for the following reasons:   The 2013 ASCVD risk score is only valid for ages 65 to 81  Current antihyperlipidemic regimen:  Atorvastatin 40 mg daily Fish Oil 1200 mg daily CoQ10 100 mg daily  Previous antihyperlipidemic medications tried: None noted  ASCVD risk enhancing conditions: age >49 and HTN, Pre diabetes.    What recent interventions/DTPs have been made by any provider to improve Cholesterol control since last CPP Visit: None.  Any recent hospitalizations or ED visits since last visit with CPP? Patient stated no.  What diet changes have been made to improve Cholesterol?  Patent stated he eats as much protein as he can. He stated he tries to eat at least 70-90 mg of protein a day.  What exercise is being done to improve Cholesterol?  Patent stated he is not exercising at this time , he stated he is waiting for wound to heal as well as figuring out why he is experiencing pain in his hips and legs when he walks. He stated he will start once those two situations are resolved.  Adherence Review: Does the patient have >5 day gap between last estimated fill dates? No  Follow-Up:  Pharmacist Review   Charlann Lange, RMA Clinical Pharmacist Assistant [phone]  10 minutes spent in review, coordination, and documentation.  Reviewed by: Beverly Milch, PharmD Clinical Pharmacist Philip [name] [phone]

## 2020-12-28 ENCOUNTER — Other Ambulatory Visit: Payer: Self-pay

## 2020-12-28 DIAGNOSIS — I739 Peripheral vascular disease, unspecified: Secondary | ICD-10-CM

## 2020-12-30 ENCOUNTER — Ambulatory Visit: Payer: Medicare HMO | Admitting: Physician Assistant

## 2020-12-30 ENCOUNTER — Other Ambulatory Visit: Payer: Self-pay

## 2020-12-30 ENCOUNTER — Ambulatory Visit (HOSPITAL_COMMUNITY)
Admission: RE | Admit: 2020-12-30 | Discharge: 2020-12-30 | Disposition: A | Discharge status: Home / Self Care | Payer: Medicare HMO | Source: Ambulatory Visit | Attending: Vascular Surgery | Admitting: Vascular Surgery

## 2020-12-30 VITALS — BP 137/73 | HR 70 | Temp 98.6°F | Resp 20 | Ht 69.0 in | Wt 228.4 lb

## 2020-12-30 DIAGNOSIS — I739 Peripheral vascular disease, unspecified: Secondary | ICD-10-CM | POA: Insufficient documentation

## 2020-12-30 DIAGNOSIS — I714 Abdominal aortic aneurysm, without rupture, unspecified: Secondary | ICD-10-CM

## 2020-12-30 MED ORDER — CILOSTAZOL 100 MG PO TABS
100.0000 mg | ORAL_TABLET | Freq: Two times a day (BID) | ORAL | 1 refills | Status: DC
Start: 2020-12-30 — End: 2021-02-09

## 2020-12-30 NOTE — Progress Notes (Signed)
Established Patient   History of Present Illness   Philip [name] is a 80 y.o. (1941/07/25) male who presents with chief complaint of bilateral hip, thigh, and calf claudication.  He was referred by his Orthopedic Dr. Gladstone Lighter.  Workup has included MR, myelogram and CT of lumbar spine demonstrating mild lumbar spine disease per patient.  He is able to walk 200-300 ft before experiencing severe bilateral hip, thigh, and eventually, calf claudication.  Symptoms are symmetrical and he is unable to tell if one side is worse than the other.  Symptoms resolve slowly if he stops however resolve instantly if he sits down.  He denies rest pain or tissue changes of his feet.  He is frustrated by his lack of mobility and subsequent weight gain.  He is also frustrated by not knowing the etiology of the symptoms. Surgical history is significant for EVAR by Dr. 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  He is on Eliquis for atrial fibrillation.  He is on a daily statin.  Current Outpatient Medications  Medication Sig Dispense Refill  . cilostazol (PLETAL) 100 MG tablet Take 1 tablet (100 mg total) by mouth 2 (two) times daily before a meal. 60 tablet 1  . amLODipine (NORVASC) 10 MG tablet Take 1 tablet (10 mg total) by mouth daily. 90 tablet 3  . atorvastatin (LIPITOR) 40 MG tablet TAKE 1 TABLET (40 MG TOTAL) BY MOUTH AT BEDTIME. 90 tablet 2  . B Complex-Biotin-FA (B-COMPLEX PO) Take 1 tablet by mouth daily.    . Cholecalciferol (VITAMIN D3) 5000 UNITS CAPS Take 5,000 Units by mouth daily. (Patient not taking: Reported on 11/24/2020)    . clobetasol cream (TEMOVATE) 4.54 % Apply 1 application topically 2 (two) times daily.    Marland Kitchen co-enzyme Q-10 30 MG capsule Take 30 mg by mouth daily.    Marland Kitchen ELIQUIS 5 MG TABS tablet TAKE 1 TABLET TWICE DAILY 180 tablet 1  . hydrocortisone 2.5 % cream Apply 1 application topically as needed.    Marland Kitchen levothyroxine  (SYNTHROID) 25 MCG tablet Take 1 tablet (25 mcg total) by mouth daily before breakfast. 30 tablet 0  . lisinopril (ZESTRIL) 20 MG tablet Take 1 tablet (20 mg total) by mouth daily. Please make yearly appt with Dr. Irish Lack for April 2022 for future refills. Thank you 1st attempt 90 tablet 0  . magnesium oxide (MAG-OX) 400 MG tablet Take 800 mg by mouth daily.    . Multiple Vitamins-Minerals (MULTIVITAMIN WITH MINERALS) tablet Take 1 tablet by mouth daily.    . Omega-3 Fatty Acids (OMEGA-3 FISH OIL) 1200 MG CAPS Take 2 1,200 mg capsules daily 90 capsule 3  . polyethylene glycol (MIRALAX / GLYCOLAX) packet Take 17 g by mouth daily.    . predniSONE (DELTASONE) 10 MG tablet Take 10 mg by mouth daily. (Patient not taking: Reported on 11/24/2020)     No current facility-administered medications for this visit.    REVIEW OF SYSTEMS (negative unless checked):   Cardiac:  []  Chest pain or chest pressure? []  Shortness of breath upon activity? []  Shortness of breath when lying flat? []  Irregular heart rhythm?  Vascular:  []  Pain in calf, thigh, or hip brought on by walking? []  Pain in feet at night that wakes you up from your sleep? []  Blood clot in your veins? []  Leg swelling?  Pulmonary:  []  Oxygen at home? []  Productive cough? []  Wheezing?  Neurologic:  []   Sudden weakness in arms or legs? []  Sudden numbness in arms or legs? []  Sudden onset of difficult speaking or slurred speech? []  Temporary loss of vision in one eye? []  Problems with dizziness?  Gastrointestinal:  []  Blood in stool? []  Vomited blood?  Genitourinary:  []  Burning when urinating? []  Blood in urine?  Psychiatric:  []  Major depression  Hematologic:  []  Bleeding problems? []  Problems with blood clotting?  Dermatologic:  []  Rashes or ulcers?  Constitutional:  []  Fever or chills?  Ear/Nose/Throat:  []  Change in hearing? []  Nose bleeds? []  Sore throat?  Musculoskeletal:  [x]  Back pain? []  Joint  pain? []  Muscle pain?   Physical Examination   Vitals:   12/30/20 1124  BP: 137/73  Pulse: 70  Resp: 20  Temp: 98.6 F (37 C)  TempSrc: Temporal  SpO2: 97%  Weight: 228 lb 6.4 oz (103.6 kg)  Height: 5\' 9"  (1.753 m)   Body mass index is 33.73 kg/m.  General:  WDWN in NAD; vital signs documented above Gait: Not observed HENT: WNL, normocephalic Pulmonary: normal non-labored breathing , without Rales, rhonchi,  wheezing Cardiac: regular HR Abdomen: soft, NT, no masses Skin: without rashes Vascular Exam/Pulses:  Right Left  Radial 2+ (normal) 2+ (normal)  Femoral 2+ (normal) 2+ (normal)  DP 2+ (normal) absent  PT 2+ (normal) absent   Extremities: without ischemic changes, without Gangrene , without cellulitis; without open wounds;  Musculoskeletal: no muscle wasting or atrophy  Neurologic: A&O X 3;  No focal weakness or paresthesias are detected Psychiatric:  The pt has Normal affect.  Non-Invasive Vascular imaging   ABI  ABI/TBIToday's ABIToday's TBIPrevious ABIPrevious TBI  +-------+-----------+-----------+------------+------------+  Right 1.13    0.77                  +-------+-----------+-----------+------------+------------+  Left  0.80    0.51     Medical Decision Making   [name] is a 80 y.o. male who presents with:  bilateral leg intermittent claudication without evidence of critical limb ischemia.   Based on physical exam bilateral lower extremities seem well perfused; patient has a palpable right DP pulse  ABIs suggest possible left SFA occlusive disease however he is without rest pain or non healing wounds.    Given that claudication symptoms are symmetrical and involve mainly the hips and thighs despite 2+ femoral pulses, it is highly unlikely that the etiology is related to arterial disease.  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  The patient is frustrated by his lack of mobility and has been  told that these symptoms are unrelated to the findings on MR, myelogram, and CT lumbar spine.  We will try a walking program and a prescription for Pletal to see if these will offer any relief.  He will follow up in 3 months with repeat ABIs.  He is also due for surveillance of EVAR around that time.  He has been instructed to discontinue Pletal if he experiences any side effects or if he does not notice any relief in symptoms after 4 weeks  He will call/return to office sooner with worsening symptoms    Dagoberto Ligas PA-C [facility] Office: [facility] MD: Scot [name]

## 2020-12-31 ENCOUNTER — Other Ambulatory Visit: Payer: Self-pay

## 2020-12-31 DIAGNOSIS — I739 Peripheral vascular disease, unspecified: Secondary | ICD-10-CM

## 2021-01-06 ENCOUNTER — Telehealth: Payer: Self-pay | Admitting: Interventional Cardiology

## 2021-01-06 DIAGNOSIS — I2510 Atherosclerotic heart disease of native coronary artery without angina pectoris: Secondary | ICD-10-CM

## 2021-01-06 MED ORDER — APIXABAN 5 MG PO TABS: 5.0000 mg | ORAL_TABLET | Freq: Two times a day (BID) | ORAL | 1 refills | Status: DC

## 2021-01-06 NOTE — Telephone Encounter (Signed)
Prescription refill request for Eliquis received.  Indication: paf Last office visit: 02/17/2020 Scr: 0.89, 09/09/2020 Age: 80 yo  Weight: 103.6 kg   Pt is on the correct dose of Eliquis per dosing criteria, prescription refill sent for Eliquis 5mg  BID.

## 2021-01-06 NOTE — Telephone Encounter (Signed)
*  STAT* If patient is at the pharmacy, call can be transferred to refill team.   1. Which medications need to be refilled? (please list name of each medication and dose if known) ELIQUIS 5 MG TABS tablet  2. Which pharmacy/location (including street and city if local pharmacy) is medication to be sent to? [place], [place]  3. Do they need a 30 day or 90 day supply? 90 day supply

## 2021-01-08 ENCOUNTER — Ambulatory Visit (INDEPENDENT_AMBULATORY_CARE_PROVIDER_SITE_OTHER): Payer: Medicare HMO | Admitting: Pulmonary Disease

## 2021-01-08 ENCOUNTER — Encounter: Payer: Self-pay | Admitting: Pulmonary Disease

## 2021-01-08 ENCOUNTER — Other Ambulatory Visit: Payer: Self-pay

## 2021-01-08 VITALS — BP 132/84 | HR 93 | Temp 98.1°F | Ht 69.0 in | Wt 232.1 lb

## 2021-01-08 DIAGNOSIS — G4733 Obstructive sleep apnea (adult) (pediatric): Secondary | ICD-10-CM | POA: Diagnosis not present

## 2021-01-08 NOTE — Assessment & Plan Note (Signed)
CPAP is working well for him.  He is very compliant by history and this is confirmed on his download which shows good control of events and mild leak and compliance more than 7 hours every night. CPAP is certainly helped improve his daytime somnolence and fatigue  CPAP supplies will be renewed for a year.  He would be eligible for new machine however his current machine is working well and he can wait until this starts giving him any problems  Weight loss encouraged, compliance with goal of at least 4-6 hrs every night is the expectation. Advised against medications with sedative side effects Cautioned against driving when sleepy - understanding that sleepiness will vary on a day to day basis

## 2021-01-08 NOTE — Patient Instructions (Signed)
CPAP supplies will be renewed x  1year CPAP is working well on 10 cm

## 2021-01-08 NOTE — Progress Notes (Signed)
Subjective:    Patient ID: Philip [name], male    DOB: Aug 18, 1941, 80 y.o.   MRN: [id number]  HPI  Chief Complaint  Patient presents with  . Consult    Former pt of RA.  Last seen 08/2016.  Here to re-establish care.  Still using cpap every night.    [name] is an 80 year old male who presents to reestablish care for obstructive sleep apnea. He was originally diagnosed in 2009 with severe OSA and is maintained on CPAP of 10 cm since then.  His last office visit with [place] was 08/2017 and he has been lost to follow-up.  We got him a new CPAP machine set at 10 cm in 2017.  His CPAP machine at work for more than 10 years. He states that he has done well  during the pandemic except for deconditioning. PMH -hip pain, left femoral artery stenosis, hypertension, skin cancer, CAD, atrial fibrillation  He is compliant with his CPAP machine.  Epworth sleepiness score is 3 and he denies excessive somnolence or fatigue.  Wife has not noted snoring.  He reports weight gain however when I review his weight records has been constant from 239-2 32 pounds. Bedtime is between 10:11 PM, sleep latency is minimal, he reports 1-2 nocturnal awakenings and is out of bed between 6 and 6:30 AM feeling rested without dryness of mouth or headaches  There is no history suggestive of cataplexy, sleep paralysis or parasomnias   Significant tests/ events reviewed  PSG 07/2008 >> severe OSA with AHI 33 per hour lowest desaturation of 84% and PLM's -  corrected by CPAP of 10    Past Medical History:  Diagnosis Date  . AAA (abdominal aortic aneurysm) ([place])   . Atrial fibrillation ([place])   . CAD (coronary artery disease)    had a MI , s/p CABG  . Cataracts, bilateral    immature  . CHF (congestive heart failure) ([place])   . Dysrhythmia    paroximal a fib  . GERD (gastroesophageal reflux disease)    takes Omeprazole daily  . History of colon polyps   . History of kidney stones   . History of shingles   .  Hyperlipidemia   . Hypertension    takes Amlodipine,Metoprolol,and Lisinopril daily  . Joint pain   . Joint swelling   . Myocardial infarction [facility])    several with last one being 2001(but never knew about any except 2001)  . OSA (obstructive sleep apnea)    started CPAP 12/09  . Peripheral vascular disease ([place])   . Pneumonia    as a child  . Skin cancer    several , one of them was melanoma (sees derm routinely)  . Tingling    feet occasionally  . Tinnitus     Past Surgical History:  Procedure Laterality Date  . ABDOMINAL AORTAGRAM N/A 06/16/2014   Procedure: ABDOMINAL Maxcine Ham;  Surgeon: Angelia Mould, MD;  Location: [facility] CATH LAB;  Service: Cardiovascular;  Laterality: N/A;  . ABDOMINAL AORTIC ENDOVASCULAR STENT GRAFT N/A 07/08/2014   Procedure: ABDOMINAL AORTIC ENDOVASCULAR STENT GRAFT/ GORE;  Surgeon: Angelia Mould, MD;  Location: [place];  Service: Vascular;  Laterality: N/A;  . CARDIAC CATHETERIZATION  2015  . COLONOSCOPY    . CORONARY ARTERY BYPASS GRAFT  1999   x 3 vessels  . LEFT HEART CATHETERIZATION WITH CORONARY/GRAFT ANGIOGRAM N/A 05/29/2014   Procedure: LEFT HEART CATHETERIZATION WITH [name];  Surgeon: [name], MD;  Location:  [place] CATH LAB;  Service: Cardiovascular;  Laterality: N/A;  . PENILE PROSTHESIS IMPLANT  08/2005  . RHINOPLASTY  1975    No Known Allergies  Social History   Socioeconomic History  . Marital status: Married    Spouse name: Not on file  . Number of children: 3  . Years of education: 38  . Highest education level: Not on file  Occupational History  . Occupation: retired, Actor  Tobacco Use  . Smoking status: Former Smoker    Types: Cigarettes    Quit date: 1979    Years since quitting: 43.2  . Smokeless tobacco: Never Used  . Tobacco comment: quit smoking in 1979  Vaping Use  . Vaping Use: Never used  Substance and Sexual Activity  . Alcohol use: Yes    Comment: occasionally - once  per week  . Drug use: No  . Sexual activity: Not Currently  Other Topics Concern  . Not on file  Social History Narrative   Lives w/ wife in a 2 story home.  Has one son and 2 stepchildren.  Retired for [employer].     Education: high school.   Social Determinants of Health   Financial Resource Strain: Not on file  Food Insecurity: Not on file  Transportation Needs: Not on file  Physical Activity: Not on file  Stress: Not on file  Social Connections: Not on file  Intimate Partner Violence: Not on file     Family History  Problem Relation Age of Onset  . Prostate cancer Father 48  . Heart disease Father   . Skin cancer Father   . Heart attack Father   . Cancer Father   . Deep vein thrombosis Father   . Hyperlipidemia Father   . Hypertension Father   . Heart disease Mother   . Skin cancer Mother   . Heart attack Mother   . Cancer Mother   . Hyperlipidemia Mother   . Hypertension Mother   . Varicose Veins Mother   . Peripheral vascular disease Mother        amputation  . Sleep apnea Brother   . Hyperlipidemia Brother   . Hypertension Brother   . Prostate cancer Brother 32  . Colon cancer Other        aunt  . Skin cancer Brother   . Skin cancer Sister   . Cancer Sister   . Heart disease Sister   . Diabetes Neg Hx   . Stroke Neg Hx      Review of Systems Constitutional: negative for anorexia, fevers and sweats  Eyes: negative for irritation, redness and visual disturbance  Ears, nose, mouth, throat, and face: negative for earaches, epistaxis, nasal congestion and sore throat  Respiratory: negative for cough, dyspnea on exertion, sputum and wheezing  Cardiovascular: negative for chest pain, dyspnea, lower extremity edema, orthopnea, palpitations and syncope  Gastrointestinal: negative for abdominal pain, constipation, diarrhea, melena, nausea and vomiting  Genitourinary:negative for dysuria, frequency and hematuria  Hematologic/lymphatic: negative for bleeding, easy  bruising and lymphadenopathy  Musculoskeletal:negative for arthralgias, muscle weakness and stiff joints  Neurological: negative for coordination problems, gait problems, headaches and weakness  Endocrine: negative for diabetic symptoms including polydipsia, polyuria and weight loss     Objective:   Physical Exam  Gen. Pleasant, obese, in no distress, normal affect ENT - no pallor,icterus, no post nasal drip, class 2-3 airway Neck: No JVD, no thyromegaly, no carotid bruits Lungs: no use of accessory muscles, no dullness to  percussion, decreased without rales or rhonchi  Cardiovascular: Rhythm regular, heart sounds  normal, no murmurs or gallops, no peripheral edema Abdomen: soft and non-tender, no hepatosplenomegaly, BS normal. Musculoskeletal: No deformities, no cyanosis or clubbing Neuro:  alert, non focal, no tremors       Assessment & Plan:

## 2021-02-08 NOTE — Progress Notes (Signed)
Cardiology Office Note   Date:  02/09/2021   ID:  [name], DOB [birth year], MRN [id number]  PCP:  Colon Branch, MD    No chief complaint on file.  CAD  Wt Readings from Last 3 Encounters:  02/09/21 229 lb 3.2 oz (104 kg)  01/08/21 232 lb 2 oz (105.3 kg)  12/30/20 228 lb 6.4 oz (103.6 kg)       History of Present Illness: Philip [name] is a 80 y.o. male  with history of CAD status post CABG (1999), AAA status post endovascular repair (2015), paroxysmal atrial fibrillation, HTN, hyperlipidemia, and CKD stage III, who presents for follow-up ofcoronary artery disease and PAF.Dr. Rob Bunting saw him in February 2019, at which time he was doing well other than mild leg edema and modest weight gain.He subsequently obtained an echocardiogram, which showed preserved LV systolic function with grade 1 diastolic dysfunction.  Never had palpitations when he had AFib in the past.  EVAR by Dr. 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  His wife had orthopedic surgery in 2019.   H/o PAF so has been on lifelong anticoagulation due to CHADS-vasc of 4.  With the virus, he stopped going to the gym. Lost 40 lbs.  Maintained weight loss for the most part.  Playing golf and working in the yard are his typical activities.    He has received both COVID vaccines.    BP was elevated in 2021.  Lisinopril was increased to 20 mg daily.  He has had back problems- spinal stenosis diagnosed recently.  He has some PAD, left SFA disease which is being treated conservatively for now.  Hip pain limits him the most.  Walking is limited.    Can do stationary bike with some pain in the left leg.    Denies : Chest pain. Dizziness. Leg edema. Nitroglycerin use. Orthopnea. Palpitations. Paroxysmal nocturnal dyspnea. Shortness of breath. Syncope.      Past Medical History:  Diagnosis Date  . AAA (abdominal aortic aneurysm)  ([place])   . Atrial fibrillation ([place])   . CAD (coronary artery disease)    had a MI , s/p CABG  . Cataracts, bilateral    immature  . CHF (congestive heart failure) ([place])   . Dysrhythmia    paroximal a fib  . GERD (gastroesophageal reflux disease)    takes Omeprazole daily  . History of colon polyps   . History of kidney stones   . History of shingles   . Hyperlipidemia   . Hypertension    takes Amlodipine,Metoprolol,and Lisinopril daily  . Joint pain   . Joint swelling   . Myocardial infarction [facility])    several with last one being 2001(but never knew about any except 2001)  . OSA (obstructive sleep apnea)    started CPAP 12/09  . Peripheral vascular disease ([place])   . Pneumonia    as a child  . Skin cancer    several , one of them was melanoma (sees derm routinely)  . Tingling    feet occasionally  . Tinnitus     Past Surgical History:  Procedure Laterality Date  . ABDOMINAL AORTAGRAM N/A 06/16/2014   Procedure: ABDOMINAL Maxcine Ham;  Surgeon: Angelia Mould, MD;  Location: [facility] CATH LAB;  Service: Cardiovascular;  Laterality: N/A;  . ABDOMINAL AORTIC ENDOVASCULAR STENT GRAFT N/A 07/08/2014   Procedure: ABDOMINAL AORTIC ENDOVASCULAR STENT GRAFT/ GORE;  Surgeon: Judeth Cornfield  Scot [name], MD;  Location: [place];  Service: Vascular;  Laterality: N/A;  . CARDIAC CATHETERIZATION  2015  . COLONOSCOPY    . CORONARY ARTERY BYPASS GRAFT  1999   x 3 vessels  . LEFT HEART CATHETERIZATION WITH CORONARY/GRAFT ANGIOGRAM N/A 05/29/2014   Procedure: LEFT HEART CATHETERIZATION WITH [name];  Surgeon: [name], MD;  Location: [facility] CATH LAB;  Service: Cardiovascular;  Laterality: N/A;  . PENILE PROSTHESIS IMPLANT  08/2005  . RHINOPLASTY  1975     Current Outpatient Medications  Medication Sig Dispense Refill  . amLODipine (NORVASC) 10 MG tablet Take 1 tablet (10 mg total) by mouth daily. 90 tablet 3  . apixaban (ELIQUIS) 5 MG TABS tablet Take 1 tablet (5 mg total) by  mouth 2 (two) times daily. 180 tablet 1  . atorvastatin (LIPITOR) 40 MG tablet TAKE 1 TABLET (40 MG TOTAL) BY MOUTH AT BEDTIME. 90 tablet 2  . B Complex-Biotin-FA (B-COMPLEX PO) Take 1 tablet by mouth daily.    . Cholecalciferol (VITAMIN D3) 5000 UNITS CAPS Take 5,000 Units by mouth daily.    Marland Kitchen co-enzyme Q-10 30 MG capsule Take 30 mg by mouth daily.    . hydrocortisone 2.5 % cream Apply 1 application topically as needed.    Marland Kitchen levothyroxine (SYNTHROID) 25 MCG tablet Take 1 tablet (25 mcg total) by mouth daily before breakfast. 30 tablet 0  . lisinopril (ZESTRIL) 20 MG tablet Take 1 tablet (20 mg total) by mouth daily. Please make yearly appt with Dr. Irish Lack for April 2022 for future refills. Thank you 1st attempt 90 tablet 0  . magnesium oxide (MAG-OX) 400 MG tablet Take 800 mg by mouth daily.    . Multiple Vitamins-Minerals (MULTIVITAMIN WITH MINERALS) tablet Take 1 tablet by mouth daily.    . Omega-3 Fatty Acids (OMEGA-3 FISH OIL) 1200 MG CAPS Take 2 1,200 mg capsules daily 90 capsule 3  . polyethylene glycol (MIRALAX / GLYCOLAX) packet Take 17 g by mouth daily.     No current facility-administered medications for this visit.    Allergies:   Patient has no known allergies.    Social History:  The patient  reports that he quit smoking about 43 years ago. His smoking use included cigarettes. He has never used smokeless tobacco. He reports current alcohol use. He reports that he does not use drugs.   Family History:  The patient's family history includes Cancer in his father, mother, and sister; Colon cancer in an other family member; Deep vein thrombosis in his father; Heart attack in his father and mother; Heart disease in his father, mother, and sister; Hyperlipidemia in his brother, father, and mother; Hypertension in his brother, father, and mother; Peripheral vascular disease in his mother; Prostate cancer (age of onset: 45) in his father; Prostate cancer (age of onset: 6) in his brother;  Skin cancer in his brother, father, mother, and sister; Sleep apnea in his brother; Varicose Veins in his mother.    ROS:  Please see the history of present illness.   Otherwise, review of systems are positive for LE swelling.   All other systems are reviewed and negative.    PHYSICAL EXAM: VS:  BP 140/82   Pulse 92   Ht 5\' 9"  (1.753 m)   Wt 229 lb 3.2 oz (104 kg)   SpO2 95%   BMI 33.85 kg/m  , BMI Body mass index is 33.85 kg/m. GEN: Well nourished, well developed, in no acute distress  HEENT: normal  Neck: no JVD, carotid bruits, or masses Cardiac: RRR; no murmurs, rubs, or gallops,; tr pitting leg edema  Respiratory:  clear to auscultation bilaterally, normal work of breathing GI: soft, nontender, nondistended, + BS MS: no deformity or atrophy  Skin: warm and dry, no rash Neuro:  Strength and sensation are intact Psych: euthymic mood, full affect   EKG:   The ekg ordered today demonstrates NSR, PVC   Recent Labs: 04/24/2020: Hemoglobin 14.4; Platelets 131.0 09/09/2020: ALT 27; BUN 15; Creatinine, Ser 0.89; Potassium 5.2; Sodium 141; TSH 3.520   Lipid Panel    Component Value Date/Time   CHOL 137 09/09/2020 0842   TRIG 124 09/09/2020 0842   TRIG 256 09/14/2009 0000   HDL 49 09/09/2020 0842   CHOLHDL 2.9 09/12/2018 0837   CHOLHDL 3 12/07/2016 0852   VLDL 30.8 12/07/2016 0852   LDLCALC 66 09/09/2020 0842   LDLDIRECT 68.0 04/13/2015 0823     Other studies Reviewed: Additional studies/ records that were reviewed today with results demonstrating: LDL 66 in 11/21 .   ASSESSMENT AND PLAN:  1. CAD: No angina. Continue aggressive secondary prevention.   2. PAF: Eliquis for stroke prevention. No palpitations. 3. HTN: The current medical regimen is effective;  continue present plan and medications.  Continue lisinopril.  Controlled at home 224-825O systolic.  4. Hyperlipidemia:LDL 66. Continue statin.  5. PAD: s/p AAA repair.  6. 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  Whole food, plant  based diet.    Current medicines are reviewed at length with the patient today.  The patient concerns regarding his medicines were addressed.  The following changes have been made:  No change  Labs/ tests ordered today include:  No orders of the defined types were placed in this encounter.   Recommend 150 minutes/week of aerobic exercise Low fat, low carb, high fiber diet recommended  Disposition:   FU in 1 year   Signed, Larae Grooms, MD  02/09/2021 4:09 PM    [facility] [place], [place], [place]  [address] Phone: [phone]; Fax: [phone]

## 2021-02-09 ENCOUNTER — Other Ambulatory Visit: Payer: Self-pay

## 2021-02-09 ENCOUNTER — Encounter: Payer: Self-pay | Admitting: Interventional Cardiology

## 2021-02-09 ENCOUNTER — Ambulatory Visit: Payer: Medicare HMO | Admitting: Interventional Cardiology

## 2021-02-09 VITALS — BP 140/82 | HR 92 | Ht 69.0 in | Wt 229.2 lb

## 2021-02-09 DIAGNOSIS — I480 Paroxysmal atrial fibrillation: Secondary | ICD-10-CM

## 2021-02-09 DIAGNOSIS — E782 Mixed hyperlipidemia: Secondary | ICD-10-CM | POA: Diagnosis not present

## 2021-02-09 DIAGNOSIS — I10 Essential (primary) hypertension: Secondary | ICD-10-CM

## 2021-02-09 DIAGNOSIS — I2510 Atherosclerotic heart disease of native coronary artery without angina pectoris: Secondary | ICD-10-CM | POA: Diagnosis not present

## 2021-02-09 DIAGNOSIS — I739 Peripheral vascular disease, unspecified: Secondary | ICD-10-CM

## 2021-02-09 NOTE — Patient Instructions (Signed)
Medication Instructions:  Your physician recommends that you continue on your current medications as directed. Please refer to the Current Medication list given to you today.  *If you need a refill on your cardiac medications before your next appointment, please call your pharmacy*   Lab Work: none If you have labs (blood work) drawn today and your tests are completely normal, you will receive your results only by: . MyChart Message (if you have MyChart) OR . A paper copy in the mail If you have any lab test that is abnormal or we need to change your treatment, we will call you to review the results.   Testing/Procedures: none   Follow-Up: At [facility], you and your health needs are our priority.  As part of our continuing mission to provide you with exceptional heart care, we have created designated Provider Care Teams.  These Care Teams include your primary Cardiologist (physician) and Advanced Practice Providers (APPs -  Physician Assistants and Nurse Practitioners) who all work together to provide you with the care you need, when you need it.  We recommend signing up for the patient portal called "MyChart".  Sign up information is provided on this After Visit Summary.  MyChart is used to connect with patients for Virtual Visits (Telemedicine).  Patients are able to view lab/test results, encounter notes, upcoming appointments, etc.  Non-urgent messages can be sent to your provider as well.   To learn more about what you can do with MyChart, go to [email or web address].    Your next appointment:   12 month(s)  The format for your next appointment:   In Person  Provider:   You may see Jayadeep Varanasi, MD or one of the following Advanced Practice Providers on your designated Care Team:    Dayna Dunn, PA-C  Michele Lenze, PA-C    Other Instructions  High-Fiber Eating Plan Fiber, also called dietary fiber, is a type of carbohydrate. It is found foods such as fruits,  vegetables, whole grains, and beans. A high-fiber diet can have many health benefits. Your health care provider may recommend a high-fiber diet to help:  Prevent constipation. Fiber can make your bowel movements more regular.  Lower your cholesterol.  Relieve the following conditions: ? Inflammation of veins in the anus (hemorrhoids). ? Inflammation of specific areas of the digestive tract (uncomplicated diverticulosis). ? A problem of the large intestine, also called the colon, that sometimes causes pain and diarrhea (irritable bowel syndrome, or IBS).  Prevent overeating as part of a weight-loss plan.  Prevent heart disease, type 2 diabetes, and certain cancers. What are tips for following this plan? Reading food labels  Check the nutrition facts label on food products for the amount of dietary fiber. Choose foods that have 5 grams of fiber or more per serving.  The goals for recommended daily fiber intake include: ? Men (age 50 or younger): 34-38 g. ? Men (over age 50): 28-34 g. ? Women (age 50 or younger): 25-28 g. ? Women (over age 50): 22-25 g. Your daily fiber goal is _____________ g.   Shopping  Choose whole fruits and vegetables instead of processed forms, such as apple juice or applesauce.  Choose a wide variety of high-fiber foods such as avocados, lentils, oats, and kidney beans.  Read the nutrition facts label of the foods you choose. Be aware of foods with added fiber. These foods often have high sugar and sodium amounts per serving. Cooking  Use whole-grain flour for baking and cooking.    Cook with brown rice instead of white rice. Meal planning  Start the day with a breakfast that is high in fiber, such as a cereal that contains 5 g of fiber or more per serving.  Eat breads and cereals that are made with whole-grain flour instead of refined flour or white flour.  Eat brown rice, bulgur wheat, or millet instead of white rice.  Use beans in place of meat in  soups, salads, and pasta dishes.  Be sure that half of the grains you eat each day are whole grains. General information  You can get the recommended daily intake of dietary fiber by: ? Eating a variety of fruits, vegetables, grains, nuts, and beans. ? Taking a fiber supplement if you are not able to take in enough fiber in your diet. It is better to get fiber through food than from a supplement.  Gradually increase how much fiber you consume. If you increase your intake of dietary fiber too quickly, you may have bloating, cramping, or gas.  Drink plenty of water to help you digest fiber.  Choose high-fiber snacks, such as berries, raw vegetables, nuts, and popcorn. What foods should I eat? Fruits Berries. Pears. Apples. Oranges. Avocado. Prunes and raisins. Dried figs. Vegetables Sweet potatoes. Spinach. Kale. Artichokes. Cabbage. Broccoli. Cauliflower. Green peas. Carrots. Squash. Grains Whole-grain breads. Multigrain cereal. Oats and oatmeal. Brown rice. Barley. Bulgur wheat. Millet. Quinoa. Bran muffins. Popcorn. Rye wafer crackers. Meats and other proteins Navy beans, kidney beans, and pinto beans. Soybeans. Split peas. Lentils. Nuts and seeds. Dairy Fiber-fortified yogurt. Beverages Fiber-fortified soy milk. Fiber-fortified orange juice. Other foods Fiber bars. The items listed above may not be a complete list of recommended foods and beverages. Contact a dietitian for more information. What foods should I avoid? Fruits Fruit juice. Cooked, strained fruit. Vegetables Fried potatoes. Canned vegetables. Well-cooked vegetables. Grains White bread. Pasta made with refined flour. White rice. Meats and other proteins Fatty cuts of meat. Fried chicken or fried fish. Dairy Milk. Yogurt. Cream cheese. Sour cream. Fats and oils Butters. Beverages Soft drinks. Other foods Cakes and pastries. The items listed above may not be a complete list of foods and beverages to avoid.  Talk with your dietitian about what choices are best for you. Summary  Fiber is a type of carbohydrate. It is found in foods such as fruits, vegetables, whole grains, and beans.  A high-fiber diet has many benefits. It can help to prevent constipation, lower blood cholesterol, aid weight loss, and reduce your risk of heart disease, diabetes, and certain cancers.  Increase your intake of fiber gradually. Increasing fiber too quickly may cause cramping, bloating, and gas. Drink plenty of water while you increase the amount of fiber you consume.  The best sources of fiber include whole fruits and vegetables, whole grains, nuts, seeds, and beans. This information is not intended to replace advice given to you by your health care provider. Make sure you discuss any questions you have with your health care provider. Document Revised: 02/20/2020 Document Reviewed: 02/20/2020 Elsevier Patient Education  2021 [employer]vier Inc.   

## 2021-02-18 NOTE — Telephone Encounter (Signed)
close encounter °

## 2021-02-24 ENCOUNTER — Ambulatory Visit (INDEPENDENT_AMBULATORY_CARE_PROVIDER_SITE_OTHER): Payer: Medicare HMO | Admitting: Pharmacist

## 2021-02-24 DIAGNOSIS — Z6830 Body mass index (BMI) 30.0-30.9, adult: Secondary | ICD-10-CM

## 2021-02-24 DIAGNOSIS — E039 Hypothyroidism, unspecified: Secondary | ICD-10-CM

## 2021-02-24 DIAGNOSIS — I2510 Atherosclerotic heart disease of native coronary artery without angina pectoris: Secondary | ICD-10-CM | POA: Diagnosis not present

## 2021-02-24 DIAGNOSIS — I10 Essential (primary) hypertension: Secondary | ICD-10-CM

## 2021-02-24 DIAGNOSIS — R7303 Prediabetes: Secondary | ICD-10-CM

## 2021-02-24 DIAGNOSIS — I480 Paroxysmal atrial fibrillation: Secondary | ICD-10-CM

## 2021-02-24 DIAGNOSIS — M1612 Unilateral primary osteoarthritis, left hip: Secondary | ICD-10-CM

## 2021-02-24 DIAGNOSIS — E782 Mixed hyperlipidemia: Secondary | ICD-10-CM | POA: Diagnosis not present

## 2021-02-24 DIAGNOSIS — E669 Obesity, unspecified: Secondary | ICD-10-CM

## 2021-02-24 NOTE — Patient Instructions (Signed)
Visit Information  PATIENT GOALS: Goals Addressed            This Visit's Progress   . Chronic Care Management Pharmacy Care Plan   On track    CARE PLAN ENTRY (see longitudinal plan of care for additional care plan information)  Current Barriers:  . Chronic Disease Management support, education, and care coordination needs related to Hypertension, Hyperlipidemia/CAD, Pre-Diabetes, AFib, Hypothyroidism, Chronic Constipation, Elevated PSA   Hypertension BP Readings from Last 3 Encounters:  02/09/21 140/82  01/08/21 132/84  12/30/20 137/73   . Pharmacist Clinical Goal(s): o Over the next 180 days, patient will work with PharmD and providers to maintain BP goal <130/80  . Current regimen:  . Amlodipine 10mg  daily in evening . Lisinopril 20mg  daily in morning . Interventions: o Discussed blood pressure goal . Patient self care activities - Over the next 180 days, patient will: o Maintain blood pressure <130/80 o Continue current blood pressure medication regimen  Hyperlipidemia Lab Results  Component Value Date/Time   LDLCALC 66 09/09/2020 08:42 AM   LDLDIRECT 68.0 04/13/2015 08:23 AM   . Pharmacist Clinical Goal(s): o Over the next 180 days, patient will work with PharmD and providers to maintain LDL goal < 70 . Current regimen:  . Atorvastatin 40mg  daily . Fish Oil 1200mg  - take 2 tablets daily . CoEnzyme Q10 - 100mg  daily . Interventions: o Discussed LDL goal  . Patient self care activities - Over the next 180 days, patient will: o Maintain current cholesterol medication regimen.    Atrial Fibrillation / Stroke Prevention:  . Pharmacist Clinical Goal(s): o Over the next 180 days, patient will work with PharmD and providers to maintain heart rate in normal sinus rhythm and prevent stroke . Current regimen:  . Eliquis 5mg  twice a day . Magnesium oxide 400mg  - take 2 tablets = 800mg  daily . Interventions: o Screened for patient assistance for Eliquis - not  eligible for 2022.  o Consider rechecking serum magnesium with next labs (might be able to lower / stop magnesium supplement)  . Patient self care activities - Over the next 180 days, patient will: o Continue Eliquis at current dose   Pre-Diabetes Lab Results  Component Value Date/Time   HGBA1C 5.7 (H) 09/09/2020 08:42 AM   HGBA1C 5.8 (H) 03/02/2020 12:50 PM   . Pharmacist Clinical Goal(s): o Over the next 180 days, patient will work with PharmD and providers to maintain A1c goal <6.5% . Current regimen:  o Diet and exercise management   . Interventions: o Discussed diet and exercise o Discussed a1c goal . Patient self care activities - Over the next 180 days, patient will: o Maintain a1c less than 6.5%  Hypothyroidism  Lab Results  Component Value Date/Time   TSH 3.520 09/09/2020 08:42 AM   TSH 4.890 (H) 04/27/2020 04:37 PM   FREET4 1.12 09/09/2020 08:42 AM   FREET4 1.24 04/27/2020 04:37 PM   . Pharmacist Clinical Goal(s) o Over the next 180 days, patient will work with PharmD and providers to reduce symptoms associated with hypothyroidism . Current regimen:  o Levothyroxine 72mcg daily (patient stopped taking on his own - has not taken since 12/2020) . Interventions: o Recommended patient to follow up with Dr. Leafy Ro for recheck of TSH to see if needs to restart levothyroxine . Patient self care activities - Over the next 180 days, patient will: o Follow up with Dr. Leafy Ro o Recheck TSH with next labs  Hip and Leg Pain / Osteoarthritis  of hip: . Pharmacist Clinical Goal(s) o Over the next 180 days, patient will work with PharmD and providers to reduce pain . Current regimen:  o none . Interventions: o Recommended trial of topical diclofenac gel OTC - apply to area of pain 2 to 3 times a day . Patient self care activities - Over the next 180 days, patient will: o Follow up with Dr. Larose Kells regarding continued pain o Trial of OTC diclofenac gel o Continue with  exercise 3 times per week - goal of at least 150 minutes per week.   Medication management . Pharmacist Clinical Goal(s): o Over the next 180 days, patient will work with PharmD and providers to maintain optimal medication adherence . Current pharmacy: [facility] . Interventions o Comprehensive medication review performed. o Continue current medication management strategy . Patient self care activities - Over the next 180 days, patient will: o Focus on medication adherence by filling and taking medications appropriately  o Take medications as prescribed o Report any questions or concerns to PharmD and/or provider(s)      . Weight (lb) < 200 lb (90.7 kg)         The patient verbalized understanding of instructions, educational materials, and care plan provided today and declined offer to receive copy of patient instructions, educational materials, and care plan.   Telephone follow up appointment with care management team member scheduled for: 3 to 4 months  Cherre Robins, PharmD Clinical Pharmacist [facility] [phone]

## 2021-02-24 NOTE — Chronic Care Management (AMB) (Signed)
Chronic Care Management Pharmacy Note  02/24/2021 Name:  Philip [name] MRN:  [id number] DOB:  1941/06/11  Subjective: Philip [name] is an 80 y.o. year old male who is a primary patient of [name], [name], MD.  The CCM team was consulted for assistance with disease management and care coordination needs.    Engaged with patient by telephone for follow up visit in response to provider referral for pharmacy case management and/or care coordination services.   Consent to Services:  The patient was given information about Chronic Care Management services, agreed to services, and gave verbal consent prior to initiation of services.  Please see initial visit note for detailed documentation.   Patient Care Team: Colon Branch, MD as PCP - General [name], MD as PCP - Cardiology (Cardiology) Ardis Hughs, MD as Attending Physician (Urology) Angelia Mould, MD as Consulting Physician (Vascular Surgery) Rigoberto Noel, MD as Consulting Physician (Pulmonary Disease) Druscilla Brownie, MD as Consulting Physician (Dermatology) Camillo Flaming, OD as Consulting Physician (Optometry) Wilford Corner, MD as Consulting Physician (Gastroenterology) Eppie Gibson, MD as Attending Physician (Radiation Oncology) Malmfelt, Stephani Police, RN as Oncology Nurse Navigator Cherre Robins, PharmD (Pharmacist)  Recent office visits: 08/19/2021 - PCP (Dr Larose Kells): routine f/u; no medication changes noted  Recent consult visits: 02/09/2021 - Cardio (Dr Irish Lack) - f/u CAD and PAF. No medication changes noted. 03/11/02022 - Pulm (dr [name]) - F/U Sleep apnea and reestablish care; Using CPAP nightly. New CPAP ordered. No med changes. 12/30/2020 - [facility] ([place], PAC) seen for claudication. Recommended walking program and started Pletal to see if these will offer any relief.  F/U in 3 months with repeat ABIs. 12/16/2020 - Ortho (Dr Gladstone Lighter) Seen for vetebrogenic low back  pain.  Hospital visits: None in previous 6 months  Objective:  Lab Results  Component Value Date   CREATININE 0.89 09/09/2020   CREATININE 0.98 03/02/2020   CREATININE 1.00 10/14/2019    Lab Results  Component Value Date   HGBA1C 5.7 (H) 09/09/2020   Last diabetic Eye exam: No results found for: HMDIABEYEEXA  Last diabetic Foot exam: No results found for: HMDIABFOOTEX      Component Value Date/Time   CHOL 137 09/09/2020 0842   TRIG 124 09/09/2020 0842   TRIG 256 09/14/2009 0000   HDL 49 09/09/2020 0842   CHOLHDL 2.9 09/12/2018 0837   CHOLHDL 3 12/07/2016 0852   VLDL 30.8 12/07/2016 0852   LDLCALC 66 09/09/2020 0842   LDLDIRECT 68.0 04/13/2015 0823    Hepatic Function Latest Ref Rng & Units 09/09/2020 03/02/2020 10/14/2019  Total Protein 6.0 - 8.5 g/dL 6.9 6.6 6.6  Albumin 3.7 - 4.7 g/dL 4.7 4.6 4.8(H)  AST 0 - 40 IU/L 32 24 27  ALT 0 - 44 IU/L 27 24 26   Alk Phosphatase 44 - 121 IU/L 65 56 56  Total Bilirubin 0.0 - 1.2 mg/dL 0.8 0.8 0.7    Lab Results  Component Value Date/Time   TSH 3.520 09/09/2020 08:42 AM   TSH 4.890 (H) 04/27/2020 04:37 PM   FREET4 1.12 09/09/2020 08:42 AM   FREET4 1.24 04/27/2020 04:37 PM    CBC Latest Ref Rng & Units 04/24/2020 08/14/2019 04/26/2019  WBC 4.0 - 10.5 K/uL 3.5(L) 3.6 5.2  Hemoglobin 13.0 - 17.0 g/dL 14.4 14.8 14.8  Hematocrit 39.0 - 52.0 % 42.4 42.5 43.3  Platelets 150.0 - 400.0 K/uL 131.0(L) 158 156.0    Lab Results  Component  Value Date/Time   VD25OH 58.4 09/09/2020 08:42 AM   VD25OH 83.4 03/02/2020 12:50 PM   VD25OH 41.32 04/22/2014 04:23 PM    Clinical ASCVD: Yes  The ASCVD Risk score Mikey Bussing DC Jr., et al., 2013) failed to calculate for the following reasons:   The 2013 ASCVD risk score is only valid for ages 78 to 58    Other: CHADS2VASc = 4  Social History   Tobacco Use  Smoking Status Former Smoker  . Types: Cigarettes  . Quit date: 83  . Years since quitting: 43.3  Smokeless Tobacco Never Used   Tobacco Comment   quit smoking in 1979   BP Readings from Last 3 Encounters:  02/09/21 140/82  01/08/21 132/84  12/30/20 137/73   Pulse Readings from Last 3 Encounters:  02/09/21 92  01/08/21 93  12/30/20 70   Wt Readings from Last 3 Encounters:  02/09/21 229 lb 3.2 oz (104 kg)  01/08/21 232 lb 2 oz (105.3 kg)  12/30/20 228 lb 6.4 oz (103.6 kg)    Assessment: Review of patient past medical history, allergies, medications, health status, including review of consultants reports, laboratory and other test data, was performed as part of comprehensive evaluation and provision of chronic care management services.   SDOH:  (Social Determinants of Health) assessments and interventions performed:  SDOH Interventions   Flowsheet Row Most Recent Value  SDOH Interventions   Financial Strain Interventions Other (Comment)  [Screened for PAP for Eliquis - patient does not meet program income requirements]  Physical Activity Interventions Intervention Not Indicated      CCM Care Plan  No Known Allergies  Medications Reviewed Today    Reviewed by Cherre Robins, PharmD (Pharmacist) on 02/24/21 at [phone]  Med List Status: <None>  Medication Order Taking? Sig Documenting Provider Last Dose Status Informant  amLODipine (NORVASC) 10 MG tablet [id number] Yes Take 1 tablet (10 mg total) by mouth daily.  Patient taking differently: Take 10 mg by mouth daily. At night   [name], MD Taking Active   apixaban (ELIQUIS) 5 MG TABS tablet [id number] Yes Take 1 tablet (5 mg total) by mouth 2 (two) times daily. [name], MD Taking Active   atorvastatin (LIPITOR) 40 MG tablet [id number] Yes TAKE 1 TABLET (40 MG TOTAL) BY MOUTH AT BEDTIME. [name], MD Taking Active   B Complex-Biotin-FA (B-COMPLEX PO) [id number] Yes Take 1 tablet by mouth daily. [provider] Taking Active Self  Cholecalciferol (VITAMIN D3) 5000 UNITS CAPS [id number] Yes Take 5,000 Units by mouth  daily. [provider] Taking Active            Med Note (CANTER, KAYLYN D   Wed Oct 18, 2017 10:36 AM)    co-enzyme Q-10 30 MG capsule [id number] Yes Take 30 mg by mouth daily. [provider] Taking Active Self  hydrocortisone 2.5 % cream [id number] Yes Apply 1 application topically as needed. [provider] Taking Active   levothyroxine (SYNTHROID) 25 MCG tablet [id number] No Take 1 tablet (25 mcg total) by mouth daily before breakfast.  Patient not taking: Reported on 02/24/2021   [name] D, MD Not Taking Active   lisinopril (ZESTRIL) 20 MG tablet [id number] Yes Take 1 tablet (20 mg total) by mouth daily. Please make yearly appt with Dr. Irish Lack for April 2022 for future refills. Thank you 1st attempt [name], MD Taking Active   magnesium oxide (MAG-OX) 400 MG tablet [id number] Yes Take 800 mg by mouth  daily. [provider] Taking Active Self  Misc Natural Products (GLUCOSAMINE CHOND DOUBLE STR PO) [id number] Yes Take 1 tablet by mouth daily. [provider] Taking Active   Multiple Vitamins-Minerals (MULTIVITAMIN WITH MINERALS) tablet [id number] Yes Take 1 tablet by mouth daily. [provider] Taking Active Self  Omega-3 Fatty Acids (OMEGA-3 FISH OIL) 1200 MG CAPS [id number] Yes Take 2 1,200 mg capsules daily End, Christopher, MD Taking Active   polyethylene glycol [facility] / GLYCOLAX) packet [id number] Yes Take 17 g by mouth daily. [provider] Taking Active           Patient Active Problem List   Diagnosis Date Noted  . Skin cancer of face 08/05/2020  . Depression 04/02/2019  . Lower extremity edema 12/22/2017  . Coronary artery disease involving native coronary artery of native heart without angina pectoris 12/22/2017  . Other specified hypothyroidism 10/02/2017  . Osteoarthritis of left hip 06/26/2017  . Class 1 obesity with serious comorbidity and body mass index (BMI) of 30.0 to 30.9 in adult  05/08/2017  . Hyperglycemia 04/19/2017  . Plantar fasciitis of left foot 04/11/2017  . Vitamin D deficiency 03/29/2017  . Idiopathic peripheral neuropathy 03/13/2017  . Erectile dysfunction 02/22/2017  . PCP NOTES >>>>>>>>>>>>>>>>>>>>>>>>>>>>>>>>>>>>>>> 12/21/2015  . Chronic constipation 12/21/2015  . Atrial fibrillation ([place]) 05/07/2014  . Encounter for pre-operative cardiovascular clearance 05/07/2014  . AAA (abdominal aortic aneurysm) without rupture ([place]) 04/30/2014  . Fatigue 11/25/2013  . Hypomagnesemia 06/29/2011  . Annual physical exam 02/16/2011  . Insomnia 02/16/2011  . OSA (obstructive sleep apnea)   . History of skin cancer 07/24/2008  . Coronary atherosclerosis 07/04/2008  . Mixed hyperlipidemia 02/08/2007  . Essential hypertension 02/08/2007    Immunization History  Administered Date(s) Administered  . Fluad Quad(high Dose 65+) 08/19/2020  . H1N1 11/06/2008  . Influenza Split 09/15/2011, 09/14/2012  . Influenza Whole 08/06/2010  . Influenza, High Dose Seasonal PF 09/16/2015, 09/26/2016, 09/01/2017, 09/03/2018, 08/29/2019  . Influenza,inj,Quad PF,6+ Mos 10/01/2013  . Influenza-Unspecified 08/31/2014  . PFIZER(Purple Top)SARS-COV-2 Vaccination 12/07/2019, 01/01/2020  . Pneumococcal Conjugate-13 04/22/2014  . Pneumococcal Polysaccharide-23 01/01/2010, 10/18/2017  . Td 04/22/2014  . Zoster 10/31/2012  . Zoster Recombinat (Shingrix) 09/04/2018    Conditions to be addressed/monitored: Atrial Fibrillation, CAD, HTN, HLD and Chronic hip and leg pain; PAD; s/p AAA repair; idiopathic periphireal neuropathy; prediabetes; hypothyroidism; chronic constipation  Care Plan : General Pharmacy (Adult)  Updates made by Cherre Robins, PHARMD since 02/24/2021 12:00 AM    Problem: CHL AMB "PATIENT-SPECIFIC PROBLEM"   Priority: High  Onset Date: 02/24/2021  Note:   Current Barriers:  . Does not maintain contact with provider office ([name]) . Not taking levothyroxine 54mg  daily . Chronic Disease Management support, education, and care coordination needs related to Hypertension, Hyperlipidemia/CAD, Pre-Diabetes, AFib, Hypothyroidism, Chronic Constipation, chronic pain, osteoarthritis of hip; idiopathic peripheral neuropathy.  Pharmacist Clinical Goal(s):  .Marland KitchenOver the next 180 days, patient will achieve adherence to monitoring guidelines and medication adherence to achieve therapeutic efficacy . maintain control of blood pressure and lipids as evidenced by maintaining goals below  . contact provider office for questions/concerns as evidenced notation of same in electronic health record . Recheck TSH and restart levothyroxine if needed  through collaboration with PharmD and provider.   Interventions: . 1:1 collaboration with PColon Branch MD regarding development and update of comprehensive plan of care as evidenced by provider attestation and co-signature . Inter-disciplinary care team collaboration (see longitudinal plan of care) . Comprehensive  medication review performed; medication list updated in electronic medical record  Hypertension - BP goal <130/80  BP Readings from Last 3 Encounters:  02/09/21 140/82  01/08/21 132/84  12/30/20 137/73 .  Currently BP at goal . Home BP readings per patient: 130's / 70's . Current regimen:  . Amlodipine 55m daily in evening . Lisinopril 233mdaily in morning . Interventions: o Discussed blood pressure goal o Continue current blood pressure medication regimen  Hyperlipidemia - LDL goal < 70 . LDL current at goal .  Current regimen:  . Atorvastatin 4050maily . Fish Oil 1200m43mtake 2 tablets daily . CoEnzyme Q10 - 100mg27mly . Interventions: o Discussed LDL goal o Maintain current cholesterol medication regimen.   Atrial Fibrillation / Stroke Prevention:  . Current regimen:  . Eliquis 5mg t19me a day . Magnesium oxide 400mg -60me 2 tablets = 800mg da98m. Interventions: o Screened for patient  assistance for Eliquis - not eligible for 2022.  o Consider rechecking serum magnesium with next labs (might be able to lower / stop magnesium supplement)  o Continue Eliquis at current dose  Pre-Diabetes- A1c goal <6.5% . Current controlled  . Current regimen:  o Diet and exercise management   . Interventions: o Reviewed diet and exercise o Reviewed a1c goal   Hypothyroidism  Lab Results  Component Value Date/Time   TSH 3.520 09/09/2020 08:42 AM   TSH 4.890 (H) 04/27/2020 04:37 PM   FREET4 1.12 09/09/2020 08:42 AM   FREET4 1.24 04/27/2020 04:37 PM   . Controlled per last TSH . Patient states he stopped levothyroxine 25mcg be99me he felt was not needed and could only get 30 day supply at a time. He has not taken since 12/2020 and wishes to have have TSH checked as planned in June 2022.  . Current regimen:  o Prescribed Levothyroxine 25mcg dai25mut patient stopped on own . Interventions: o Recommended patient to follow up with Dr. Beasley fo[place]afy [place] of TSH to see if needs to restart levothyroxine o Recheck TSH with next labs o Discussed importance of communicating with providers and clinical pharmacist if he has medication concerns or needs.   Hip and Leg Pain / PAD / Osteoarthritis of hip: . Uncontrolled . Medications tried - acetaminophen (not helpful); Aleve / naproxen (helped with pain but was instructed not to take due to risk of bleeding with Eliquis and oral NSAIDs) . Also had visit with vein and vascular; recommended trial of cilostazol. Patient took for 30 days but did not see any improvement in leg pain . Current regimen:  o none . Interventions: o Recommended trial of topical diclofenac gel OTC - apply to area of pain 2 to 3 times a day o Follow up with Dr. [name] regardLarose Kells continued pain o Continue with exercise 3 times per week - goal of at least 150 minutes per week.   Medication management . Current pharmacy: [facility] o Comprehensive  medication review performed. Medication list updated o Reviewed recent refill history; discussed adherence issues with patient  o Continue current medication management strategy o Focus on medication adherence by filling and taking medications appropriately  o Report any questions or concerns to PharmD and/or provider(s)   Patient Goals/Self-Care Activities . Over the next 180 days, patient will:  take medications as prescribed, check blood pressure once per week, document, and provide at future appointments, collaborate with provider on medication access solutions, and target a minimum of 150 minutes of  moderate intensity exercise weekly  Follow Up Plan: Telephone follow up appointment with care management team member scheduled for:  3 to 4 months            Medication Assistance: Screened patient for Eliquis / [place] patient assistance but he did not meet income requirement for 2022.   Patient's preferred pharmacy is:  [facility] ([facility]), [place] - [address] [place] [address] Phone: [phone] Fax: [phone]  [facility] [place], [place] [address] Phone: [phone] Fax: [phone]   Follow Up:  Patient agrees to Care Plan and Follow-up.  Plan: Trial of OTC diclofenac gel 1% applied to hip 2 to 3 times a day as needed for pain.  Discussed adherence, especially in regards to not taking levothyroxine 42mg for the last month. Patient wishes to have labs checked as planned in June 2022 and see if levothyroxine is still needed. Will notify PCP.  Consider checking serum magnesium with next labs since patient is taking Mag Ox 8061mdaily. Continue all other medications Continue to limit intake of fat and high CHO foods.  Continue to exercise 3 times per week with goal of at least 150 minutes of exercise per week.     Telephone follow  up appointment with care management team member scheduled for:  3 to 4 months  TaCherre RobinsPharmD Clinical Pharmacist [facility]

## 2021-02-25 DIAGNOSIS — L819 Disorder of pigmentation, unspecified: Secondary | ICD-10-CM | POA: Diagnosis not present

## 2021-02-25 DIAGNOSIS — D229 Melanocytic nevi, unspecified: Secondary | ICD-10-CM | POA: Diagnosis not present

## 2021-02-25 DIAGNOSIS — L570 Actinic keratosis: Secondary | ICD-10-CM | POA: Diagnosis not present

## 2021-02-25 DIAGNOSIS — L814 Other melanin hyperpigmentation: Secondary | ICD-10-CM | POA: Diagnosis not present

## 2021-02-25 DIAGNOSIS — L821 Other seborrheic keratosis: Secondary | ICD-10-CM | POA: Diagnosis not present

## 2021-02-25 DIAGNOSIS — Z85820 Personal history of malignant melanoma of skin: Secondary | ICD-10-CM | POA: Diagnosis not present

## 2021-03-01 ENCOUNTER — Other Ambulatory Visit: Payer: Self-pay | Admitting: Interventional Cardiology

## 2021-03-01 DIAGNOSIS — R0600 Dyspnea, unspecified: Secondary | ICD-10-CM

## 2021-03-01 DIAGNOSIS — I2510 Atherosclerotic heart disease of native coronary artery without angina pectoris: Secondary | ICD-10-CM

## 2021-03-01 DIAGNOSIS — R5383 Other fatigue: Secondary | ICD-10-CM

## 2021-03-01 DIAGNOSIS — R0609 Other forms of dyspnea: Secondary | ICD-10-CM

## 2021-04-02 DIAGNOSIS — H40012 Open angle with borderline findings, low risk, left eye: Secondary | ICD-10-CM | POA: Diagnosis not present

## 2021-04-07 ENCOUNTER — Other Ambulatory Visit: Payer: Self-pay

## 2021-04-07 ENCOUNTER — Ambulatory Visit (HOSPITAL_COMMUNITY)
Admission: RE | Admit: 2021-04-07 | Discharge: 2021-04-07 | Disposition: A | Discharge status: Home / Self Care | Payer: Medicare HMO | Source: Ambulatory Visit | Attending: Vascular Surgery | Admitting: Vascular Surgery

## 2021-04-07 ENCOUNTER — Ambulatory Visit (INDEPENDENT_AMBULATORY_CARE_PROVIDER_SITE_OTHER): Payer: Medicare HMO | Admitting: Physician Assistant

## 2021-04-07 VITALS — BP 140/71 | HR 75 | Temp 98.3°F | Resp 20 | Ht 69.0 in | Wt 225.7 lb

## 2021-04-07 DIAGNOSIS — I714 Abdominal aortic aneurysm, without rupture, unspecified: Secondary | ICD-10-CM

## 2021-04-07 DIAGNOSIS — I739 Peripheral vascular disease, unspecified: Secondary | ICD-10-CM

## 2021-04-07 NOTE — Progress Notes (Signed)
Office Note     CC:  follow up Requesting Provider:  Colon Branch, MD  HPI: Philip [name] is a 80 y.o. (1941/02/21) male who presents for recheck of left lower extremity discomfort.  He was last seen by myself in March of this year complaining of bilateral hip thigh and calf claudication left more so than the right.  He is a history of lumbar spine disease followed by an orthopedic surgeon Dr. Gladstone Lighter.  Surgical history also significant for EVAR by Dr. 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  Since last office visit he began exercising more often with walking and stationary bike use.  He states he rarely has symptoms in his right leg.  Symptoms mainly occur in left buttock and occasional radiation down his left thigh after walking or riding for a moderate distance.  He believes the symptoms are tolerable and seem to be improving.  He no longer has calf claudication.  He denies any rest pain or nonhealing wounds in his feet.  He takes a daily statin.  He is on Eliquis for atrial fibrillation.  Past Medical History:  Diagnosis Date  . AAA (abdominal aortic aneurysm) ([place])   . Atrial fibrillation ([place])   . CAD (coronary artery disease)    had a MI , s/p CABG  . Cataracts, bilateral    immature  . CHF (congestive heart failure) ([place])   . Dysrhythmia    paroximal a fib  . GERD (gastroesophageal reflux disease)    takes Omeprazole daily  . History of colon polyps   . History of kidney stones   . History of shingles   . Hyperlipidemia   . Hypertension    takes Amlodipine,Metoprolol,and Lisinopril daily  . Joint pain   . Joint swelling   . Myocardial infarction [facility])    several with last one being 2001(but never knew about any except 2001)  . OSA (obstructive sleep apnea)    started CPAP 12/09  . Peripheral vascular disease ([place])   . Pneumonia    as a child  . Skin cancer    several , one of them was melanoma (sees derm  routinely)  . Tingling    feet occasionally  . Tinnitus     Past Surgical History:  Procedure Laterality Date  . ABDOMINAL AORTAGRAM N/A 06/16/2014   Procedure: ABDOMINAL Maxcine Ham;  Surgeon: Angelia Mould, MD;  Location: [facility] CATH LAB;  Service: Cardiovascular;  Laterality: N/A;  . ABDOMINAL AORTIC ENDOVASCULAR STENT GRAFT N/A 07/08/2014   Procedure: ABDOMINAL AORTIC ENDOVASCULAR STENT GRAFT/ GORE;  Surgeon: Angelia Mould, MD;  Location: [place];  Service: Vascular;  Laterality: N/A;  . CARDIAC CATHETERIZATION  2015  . COLONOSCOPY    . CORONARY ARTERY BYPASS GRAFT  1999   x 3 vessels  . LEFT HEART CATHETERIZATION WITH CORONARY/GRAFT ANGIOGRAM N/A 05/29/2014   Procedure: LEFT HEART CATHETERIZATION WITH [name];  Surgeon: [name], MD;  Location: [facility] CATH LAB;  Service: Cardiovascular;  Laterality: N/A;  . PENILE PROSTHESIS IMPLANT  08/2005  . RHINOPLASTY  1975    Social History   Socioeconomic History  . Marital status: Married    Spouse name: Not on file  . Number of children: 3  . Years of education: 5  . Highest education level: Not on file  Occupational History  . Occupation: retired, Actor  Tobacco Use  . Smoking status: Former Smoker  Types: Cigarettes    Quit date: 1979    Years since quitting: 43.4  . Smokeless tobacco: Never Used  . Tobacco comment: quit smoking in 1979  Vaping Use  . Vaping Use: Never used  Substance and Sexual Activity  . Alcohol use: Yes    Comment: occasionally - once per week  . Drug use: No  . Sexual activity: Not Currently  Other Topics Concern  . Not on file  Social History Narrative   Lives w/ wife in a 2 story home.  Has one son and 2 stepchildren.  Retired for [employer].     Education: high school.   Social Determinants of Health   Financial Resource Strain: Low Risk   . Difficulty of Paying Living Expenses: Not very hard  Food Insecurity: Not on file  Transportation Needs: Not on file   Physical Activity: Sufficiently Active  . Days of Exercise per Week: 3 days  . Minutes of Exercise per Session: 60 min  Stress: Not on file  Social Connections: Not on file  Intimate Partner Violence: Not on file    Family History  Problem Relation Age of Onset  . Prostate cancer Father 66  . Heart disease Father   . Skin cancer Father   . Heart attack Father   . Cancer Father   . Deep vein thrombosis Father   . Hyperlipidemia Father   . Hypertension Father   . Heart disease Mother   . Skin cancer Mother   . Heart attack Mother   . Cancer Mother   . Hyperlipidemia Mother   . Hypertension Mother   . Varicose Veins Mother   . Peripheral vascular disease Mother        amputation  . Sleep apnea Brother   . Hyperlipidemia Brother   . Hypertension Brother   . Prostate cancer Brother 29  . Colon cancer Other        aunt  . Skin cancer Brother   . Skin cancer Sister   . Cancer Sister   . Heart disease Sister   . Diabetes Neg Hx   . Stroke Neg Hx     Current Outpatient Medications  Medication Sig Dispense Refill  . amLODipine (NORVASC) 10 MG tablet Take 1 tablet (10 mg total) by mouth daily. (Patient taking differently: Take 10 mg by mouth daily. At night) 90 tablet 3  . apixaban (ELIQUIS) 5 MG TABS tablet Take 1 tablet (5 mg total) by mouth 2 (two) times daily. 180 tablet 1  . atorvastatin (LIPITOR) 40 MG tablet TAKE 1 TABLET (40 MG TOTAL) BY MOUTH AT BEDTIME. 90 tablet 3  . B Complex-Biotin-FA (B-COMPLEX PO) Take 1 tablet by mouth daily.    . Cholecalciferol (VITAMIN D3) 5000 UNITS CAPS Take 5,000 Units by mouth daily.    Marland Kitchen co-enzyme Q-10 30 MG capsule Take 30 mg by mouth daily.    . hydrocortisone 2.5 % cream Apply 1 application topically as needed.    Marland Kitchen levothyroxine (SYNTHROID) 25 MCG tablet Take 1 tablet (25 mcg total) by mouth daily before breakfast. 30 tablet 0  . lisinopril (ZESTRIL) 20 MG tablet Take 1 tablet (20 mg total) by mouth daily. Please make yearly appt  with Dr. Irish Lack for April 2022 for future refills. Thank you 1st attempt 90 tablet 0  . magnesium oxide (MAG-OX) 400 MG tablet Take 800 mg by mouth daily.    . Misc Natural Products (GLUCOSAMINE CHOND DOUBLE STR PO) Take 1 tablet by mouth daily.    Marland Kitchen  Multiple Vitamins-Minerals (MULTIVITAMIN WITH MINERALS) tablet Take 1 tablet by mouth daily.    . Omega-3 Fatty Acids (OMEGA-3 FISH OIL) 1200 MG CAPS Take 2 1,200 mg capsules daily 90 capsule 3  . polyethylene glycol (MIRALAX / GLYCOLAX) packet Take 17 g by mouth daily.     No current facility-administered medications for this visit.    No Known Allergies   REVIEW OF SYSTEMS:   [X]  denotes positive finding, [ ]  denotes negative finding Cardiac  Comments:  Chest pain or chest pressure:    Shortness of breath upon exertion:    Short of breath when lying flat:    Irregular heart rhythm:        Vascular    Pain in calf, thigh, or hip brought on by ambulation:    Pain in feet at night that wakes you up from your sleep:     Blood clot in your veins:    Leg swelling:         Pulmonary    Oxygen at home:    Productive cough:     Wheezing:         Neurologic    Sudden weakness in arms or legs:     Sudden numbness in arms or legs:     Sudden onset of difficulty speaking or slurred speech:    Temporary loss of vision in one eye:     Problems with dizziness:         Gastrointestinal    Blood in stool:     Vomited blood:         Genitourinary    Burning when urinating:     Blood in urine:        Psychiatric    Major depression:         Hematologic    Bleeding problems:    Problems with blood clotting too easily:        Skin    Rashes or ulcers:        Constitutional    Fever or chills:      PHYSICAL EXAMINATION:  Vitals:   04/07/21 0939  BP: 140/71  Pulse: 75  Resp: 20  Temp: 98.3 F (36.8 C)  TempSrc: Temporal  SpO2: 97%  Weight: 225 lb 11.2 oz (102.4 kg)  Height: 5\' 9"  (1.753 m)    General:  WDWN in  NAD; vital signs documented above Gait: Not observed HENT: WNL, normocephalic Pulmonary: normal non-labored breathing Cardiac: regular HR Abdomen: soft, NT, no masses Skin: without rashes Vascular Exam/Pulses:  Right Left  Radial 2+ (normal) 2+ (normal)  Femoral 2+ (normal) 2+ (normal)  Popliteal 2+ (normal) 2+ (normal)  DP 2+ (normal) absent  PT 1+ (weak) absent   Extremities: No wounds of bilateral lower extremities Musculoskeletal: no muscle wasting or atrophy  Neurologic: A&O X 3;  No focal weakness or paresthesias are detected Psychiatric:  The pt has Normal affect.   Non-Invasive Vascular Imaging:   ABI/TBIToday's ABIToday's TBIPrevious ABIPrevious TBI  +-------+-----------+-----------+------------+------------+  Right 1.24    0.88    1.13    0.77      +-------+-----------+-----------+------------+------------+  Left  0.85    0.72    0.80    0.51         ASSESSMENT/PLAN:: 80 y.o. male here for follow-up of PAD  -Subjectively claudication symptoms have improved.  He mainly now claudicate in his left buttock with radiation down into his thigh -Given ABIs as well as physical exam of palpable  left femoral pulse and left popliteal pulse, the left buttock and thigh claudication is unlikely related to arterial circulation.  There is no indication for further work-up with more invasive procedure such as angiography -Encourage patient to continue his active lifestyle -Patient has follow-up at the end of August with Dr. Scot [name] for CTA surveillance of hypogastric artery aneurysm   Dagoberto Ligas, PA-C [facility] [phone]  Clinic MD:   Scot [name]

## 2021-04-08 ENCOUNTER — Other Ambulatory Visit: Payer: Self-pay | Admitting: Interventional Cardiology

## 2021-04-08 DIAGNOSIS — I25118 Atherosclerotic heart disease of native coronary artery with other forms of angina pectoris: Secondary | ICD-10-CM

## 2021-04-16 ENCOUNTER — Other Ambulatory Visit: Payer: Self-pay

## 2021-04-16 DIAGNOSIS — I714 Abdominal aortic aneurysm, without rupture, unspecified: Secondary | ICD-10-CM

## 2021-04-21 ENCOUNTER — Ambulatory Visit (INDEPENDENT_AMBULATORY_CARE_PROVIDER_SITE_OTHER): Payer: Medicare HMO | Admitting: Internal Medicine

## 2021-04-21 ENCOUNTER — Encounter: Payer: Self-pay | Admitting: Internal Medicine

## 2021-04-21 ENCOUNTER — Other Ambulatory Visit: Payer: Self-pay

## 2021-04-21 VITALS — BP 136/66 | HR 66 | Temp 97.8°F | Resp 16 | Ht 69.0 in | Wt 220.4 lb

## 2021-04-21 DIAGNOSIS — E038 Other specified hypothyroidism: Secondary | ICD-10-CM

## 2021-04-21 DIAGNOSIS — E039 Hypothyroidism, unspecified: Secondary | ICD-10-CM

## 2021-04-21 DIAGNOSIS — Z87898 Personal history of other specified conditions: Secondary | ICD-10-CM | POA: Diagnosis not present

## 2021-04-21 DIAGNOSIS — R739 Hyperglycemia, unspecified: Secondary | ICD-10-CM | POA: Diagnosis not present

## 2021-04-21 DIAGNOSIS — G609 Hereditary and idiopathic neuropathy, unspecified: Secondary | ICD-10-CM

## 2021-04-21 DIAGNOSIS — Z0001 Encounter for general adult medical examination with abnormal findings: Secondary | ICD-10-CM

## 2021-04-21 DIAGNOSIS — Z0000 Encounter for general adult medical examination without abnormal findings: Secondary | ICD-10-CM

## 2021-04-21 DIAGNOSIS — E782 Mixed hyperlipidemia: Secondary | ICD-10-CM | POA: Diagnosis not present

## 2021-04-21 LAB — CBC WITH DIFFERENTIAL/PLATELET
Basophils Absolute: 0 10*3/uL (ref 0.0–0.1)
Basophils Relative: 0.9 % (ref 0.0–3.0)
Eosinophils Absolute: 0.1 10*3/uL (ref 0.0–0.7)
Eosinophils Relative: 1.5 % (ref 0.0–5.0)
HCT: 43.8 % (ref 39.0–52.0)
Hemoglobin: 14.7 g/dL (ref 13.0–17.0)
Lymphocytes Relative: 42.8 % (ref 12.0–46.0)
Lymphs Abs: 1.6 10*3/uL (ref 0.7–4.0)
MCHC: 33.7 g/dL (ref 30.0–36.0)
MCV: 93.9 fl (ref 78.0–100.0)
Monocytes Absolute: 0.4 10*3/uL (ref 0.1–1.0)
Monocytes Relative: 11.5 % (ref 3.0–12.0)
Neutro Abs: 1.7 10*3/uL (ref 1.4–7.7)
Neutrophils Relative %: 43.3 % (ref 43.0–77.0)
Platelets: 139 10*3/uL — ABNORMAL LOW (ref 150.0–400.0)
RBC: 4.66 Mil/uL (ref 4.22–5.81)
RDW: 13 % (ref 11.5–15.5)
WBC: 3.9 10*3/uL — ABNORMAL LOW (ref 4.0–10.5)

## 2021-04-21 LAB — LIPID PANEL
Cholesterol: 125 mg/dL (ref 0–200)
HDL: 49.3 mg/dL (ref >39.00)
LDL Cholesterol: 48 mg/dL (ref 0–99)
NonHDL: 75.53
Total CHOL/HDL Ratio: 3
Triglycerides: 136 mg/dL (ref 0.0–149.0)
VLDL: 27.2 mg/dL (ref 0.0–40.0)

## 2021-04-21 LAB — COMPREHENSIVE METABOLIC PANEL
ALT: 25 U/L (ref 0–53)
AST: 29 U/L (ref 0–37)
Albumin: 4.8 g/dL (ref 3.5–5.2)
Alkaline Phosphatase: 63 U/L (ref 39–117)
BUN: 17 mg/dL (ref 6–23)
CO2: 30 mEq/L (ref 19–32)
Calcium: 10.2 mg/dL (ref 8.4–10.5)
Chloride: 103 mEq/L (ref 96–112)
Creatinine, Ser: 0.98 mg/dL (ref 0.40–1.50)
GFR: 72.62 mL/min (ref >60.00)
Glucose, Bld: 96 mg/dL (ref 70–99)
Potassium: 4.8 mEq/L (ref 3.5–5.1)
Sodium: 140 mEq/L (ref 135–145)
Total Bilirubin: 0.8 mg/dL (ref 0.2–1.2)
Total Protein: 7 g/dL (ref 6.0–8.3)

## 2021-04-21 LAB — PSA: PSA: 4.37 ng/mL — ABNORMAL HIGH (ref 0.10–4.00)

## 2021-04-21 LAB — HEMOGLOBIN A1C: Hgb A1c MFr Bld: 6 % (ref 4.6–6.5)

## 2021-04-21 LAB — TSH: TSH: 4.2 u[IU]/mL (ref 0.35–4.50)

## 2021-04-21 NOTE — Progress Notes (Signed)
Subjective:    Patient ID: Philip [name], male    DOB: 08/20/1941, 80 y.o.   MRN: [id number]  DOS:  04/21/2021 Type of visit - description: CPX  Here for CPX Multiple other issues discussed.  Continue with bilateral hip pain with  radiation to the buttocks and to the posterior thighs Has a long history of bilateral lower extremity numbness.Neurology referral?.  LUTS: Less noticeable than before, minimal difficulty urinating.  Patient self stopped Synthroid.   Review of Systems Denies chest pain no difficulty breathing.   Other than above, a 14 point review of systems is negative       Past Medical History:  Diagnosis Date   AAA (abdominal aortic aneurysm) ([place])    Atrial fibrillation ([place])    CAD (coronary artery disease)    had a MI , s/p CABG   Cataracts, bilateral    immature   CHF (congestive heart failure) ([place])    Dysrhythmia    paroximal a fib   GERD (gastroesophageal reflux disease)    takes Omeprazole daily   History of colon polyps    History of kidney stones    History of shingles    Hyperlipidemia    Hypertension    takes Amlodipine,Metoprolol,and Lisinopril daily   Joint pain    Joint swelling    Myocardial infarction ([place])    several with last one being 2001(but never knew about any except 2001)   OSA (obstructive sleep apnea)    started CPAP 12/09   Peripheral vascular disease ([place])    Pneumonia    as a child   Skin cancer    several , one of them was melanoma (sees derm routinely)   Tingling    feet occasionally   Tinnitus     Past Surgical History:  Procedure Laterality Date   ABDOMINAL AORTAGRAM N/A 06/16/2014   Procedure: ABDOMINAL Maxcine Ham;  Surgeon: Angelia Mould, MD;  Location: [facility] CATH LAB;  Service: Cardiovascular;  Laterality: N/A;   ABDOMINAL AORTIC ENDOVASCULAR STENT GRAFT N/A 07/08/2014   Procedure: ABDOMINAL AORTIC ENDOVASCULAR STENT GRAFT/ GORE;  Surgeon: Angelia Mould, MD;  Location: [place];  Service:  Vascular;  Laterality: N/A;   CARDIAC CATHETERIZATION  2015   COLONOSCOPY     CORONARY ARTERY BYPASS GRAFT  1999   x 3 vessels   LEFT HEART CATHETERIZATION WITH CORONARY/GRAFT ANGIOGRAM N/A 05/29/2014   Procedure: LEFT HEART CATHETERIZATION WITH [name];  Surgeon: [name], MD;  Location: [facility] CATH LAB;  Service: Cardiovascular;  Laterality: N/A;   PENILE PROSTHESIS IMPLANT  08/2005   RHINOPLASTY  1975   Social History   Socioeconomic History   Marital status: Married    Spouse name: Not on file   Number of children: 3   Years of education: 12   Highest education level: Not on file  Occupational History   Occupation: retired, Actor  Tobacco Use   Smoking status: Former    Pack years: 0.00    Types: Cigarettes    Quit date: 1979    Years since quitting: 43.5   Smokeless tobacco: Never   Tobacco comments:    quit smoking in 1979  Vaping Use   Vaping Use: Never used  Substance and Sexual Activity   Alcohol use: Yes    Comment: occasionally - once per week   Drug use: No   Sexual activity: Not Currently  Other Topics Concern   Not on file  Social History Narrative  Lives w/ wife in a 2 story home.  Has one son and 2 stepchildren.  Retired for [employer].     Education: high school.   Social Determinants of Health   Financial Resource Strain: Low Risk    Difficulty of Paying Living Expenses: Not very hard  Food Insecurity: Not on file  Transportation Needs: Not on file  Physical Activity: Sufficiently Active   Days of Exercise per Week: 3 days   Minutes of Exercise per Session: 60 min  Stress: Not on file  Social Connections: Not on file  Intimate Partner Violence: Not on file     Allergies as of 04/21/2021   No Known Allergies      Medication List        Accurate as of April 21, 2021 11:59 PM. If you have any questions, ask your nurse or doctor.          STOP taking these medications    levothyroxine 25 MCG tablet Commonly  known as: SYNTHROID Stopped by: Kathlene November, MD       TAKE these medications    amLODipine 10 MG tablet Commonly known as: NORVASC Take 1 tablet (10 mg total) by mouth daily. What changed: additional instructions   apixaban 5 MG Tabs tablet Commonly known as: Eliquis Take 1 tablet (5 mg total) by mouth 2 (two) times daily.   atorvastatin 40 MG tablet Commonly known as: LIPITOR TAKE 1 TABLET (40 MG TOTAL) BY MOUTH AT BEDTIME.   B-COMPLEX PO Take 1 tablet by mouth daily.   co-enzyme Q-10 30 MG capsule Take 30 mg by mouth daily.   GLUCOSAMINE CHOND DOUBLE STR PO Take 1 tablet by mouth daily.   hydrocortisone 2.5 % cream Apply 1 application topically as needed.   lisinopril 20 MG tablet Commonly known as: ZESTRIL Take 1 tablet (20 mg total) by mouth daily.   magnesium oxide 400 MG tablet Commonly known as: MAG-OX Take 800 mg by mouth daily.   multivitamin with minerals tablet Take 1 tablet by mouth daily.   Omega-3 Fish Oil 1200 MG Caps Take 2 1,200 mg capsules daily   polyethylene glycol 17 g packet Commonly known as: MIRALAX / GLYCOLAX Take 17 g by mouth daily.   VITAMIN C ER PO Take by mouth.   Vitamin D3 125 MCG (5000 UT) Caps Take 5,000 Units by mouth daily.           Objective:   Physical Exam BP 136/66 (BP Location: Left Arm, Patient Position: Sitting, Cuff Size: Normal)   Pulse 66   Temp 97.8 F (36.6 C) (Oral)   Resp 16   Ht 5\' 9"  (1.753 m)   Wt 220 lb 6 oz (100 kg)   SpO2 97%   BMI 32.54 kg/m  General: Well developed, NAD, BMI noted Neck: No  thyromegaly  HEENT:  Normocephalic . Face symmetric, atraumatic Lungs:  CTA B Normal respiratory effort, no intercostal retractions, no accessory muscle use. Heart: Seems regular.  Abdomen:  Not distended, soft, non-tender. No rebound or rigidity.   Lower extremities: no pretibial edema bilaterally  Skin: Exposed areas without rash. Not pale. Not jaundice Neurologic:  alert & oriented X3.   Speech normal, gait appropriate for age and unassisted Strength symmetric and appropriate for age.  Psych: Cognition and judgment appear intact.  Cooperative with normal attention span and concentration.  Behavior appropriate. No anxious or depressed appearing.     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 Fib, paroxysmal  GERD  Chronic constipation OSA- Cpap (Dr [name]) ED- penile implant H/o skin cancer, Dr. [name] H/o Kidney stones 2016  PLAN:  Here for CPX Hyperglycemia: Check A1c Neuropathy, back pain: Ongoing symptoms, status post extensive evaluation by Ortho including a myelogram February 2022, also ABIs June 2022.  They show mild spinal stenosis, L ABI mild arterial disease. Offered trial with gabapentin, declined, states that he was told to remain active and that seems to be helping. HTN: Seems controlled, no change Hyperlipidemia: On Lipitor, checking labs Subclinical hypothyroidism: TSH has been slightly elevated before, was prescribed Synthroid by another provider starting 2018, patient did not feel subjectively better, 2 months ago he stopped it.  Plan: Check TSH, consider restart meds if TSH elevated significantly. RTC 4 months   This visit occurred during the SARS-CoV-2 public health emergency.  Safety protocols were in place, including screening questions prior to the visit, additional usage of staff PPE, and extensive cleaning of exam room while observing appropriate contact time as indicated for disinfecting solutions.

## 2021-04-21 NOTE — Patient Instructions (Signed)
Check the  blood pressure 2 or 3 times a month   BP GOAL is between 110/65 and  135/85. If it is consistently higher or lower, let me know     GO TO THE LAB : Get the blood work     [place], [place] back for  a check up in 4 months   "Living will", "[place] of attorney": Advanced care planning  (If you already have a living will or healthcare power of attorney, please bring the copy to be scanned in your chart.)  Advance care planning is a process that supports adults in  understanding and sharing their preferences regarding future medical care.   The patient's preferences are recorded in documents called Advance Directives.    Advanced directives are completed (and can be modified at any time) while the patient is in full mental capacity.   The documentation should be available at all times to the patient, the family and the healthcare providers.  Bring in a copy to be scanned in your chart is an excellent idea and is recommended   This legal documents direct treatment decision making and/or appoint a surrogate to make the decision if the patient is not capable to do so.    Advance directives can be documented in many types of formats,  documents have names such as:  Lliving will  Durable power of attorney for healthcare (healthcare proxy or healthcare power of attorney)  Combined directives  Physician orders for life-sustaining treatment    More information at:  [email or web address]

## 2021-04-22 ENCOUNTER — Encounter: Payer: Self-pay | Admitting: Internal Medicine

## 2021-04-22 NOTE — Assessment & Plan Note (Signed)
-  [id number] - pnm 23: 2018;  prevnar :2015  s/p shingrix : todaty pt states he had only one shot,  rec to proceed w/ #2 - -s/p zostavax ( [employer])  -  Covid shots x 2, rec to get booster -CCS: Several Cscopes, had one in 2007 aprox. polyps x 6 ,  03-2009, hyperplastic polyps. 09-2014: polyps, cscope 08/2019, next per GI Prostate cancer screening:  +FH, saw urology, last note 07-2020 for increased PSA, slt abnormal DRE, MRI prostate: No radiographic evidence of malignancy. At this point symptoms are minimal, we will check a PSA . -Diet and exercise: Doing well -Labs: CMP, FLP, CBC, A1c, TSH, PSA -POA: On file

## 2021-04-22 NOTE — Assessment & Plan Note (Signed)
Here for CPX Hyperglycemia: Check A1c Neuropathy, back pain: Ongoing symptoms, status post extensive evaluation by Ortho including a myelogram February 2022, also ABIs June 2022.  They show mild spinal stenosis, L ABI mild arterial disease. Offered trial with gabapentin, declined, states that he was told to remain active and that seems to be helping. HTN: Seems controlled, no change Hyperlipidemia: On Lipitor, checking labs Subclinical hypothyroidism: TSH has been slightly elevated before, was prescribed Synthroid by another provider starting 2018, patient did not feel subjectively better, 2 months ago he stopped it.  Plan: Check TSH, consider restart meds if TSH elevated significantly. RTC 4 months

## 2021-05-12 ENCOUNTER — Ambulatory Visit
Admission: RE | Admit: 2021-05-12 | Discharge: 2021-05-12 | Disposition: A | Discharge status: Home / Self Care | Payer: Medicare HMO | Source: Ambulatory Visit | Attending: Vascular Surgery | Admitting: Vascular Surgery

## 2021-05-12 ENCOUNTER — Other Ambulatory Visit: Payer: Self-pay

## 2021-05-12 DIAGNOSIS — I714 Abdominal aortic aneurysm, without rupture, unspecified: Secondary | ICD-10-CM

## 2021-05-12 DIAGNOSIS — I774 Celiac artery compression syndrome: Secondary | ICD-10-CM | POA: Diagnosis not present

## 2021-05-12 DIAGNOSIS — I701 Atherosclerosis of renal artery: Secondary | ICD-10-CM | POA: Diagnosis not present

## 2021-05-12 DIAGNOSIS — I723 Aneurysm of iliac artery: Secondary | ICD-10-CM | POA: Diagnosis not present

## 2021-05-12 MED ORDER — IOPAMIDOL (ISOVUE-370) INJECTION 76%
75.0000 mL | Freq: Once | INTRAVENOUS | Status: AC | PRN
  Administered 2021-05-12: 75 mL via INTRAVENOUS

## 2021-05-26 DIAGNOSIS — L821 Other seborrheic keratosis: Secondary | ICD-10-CM | POA: Diagnosis not present

## 2021-05-26 DIAGNOSIS — L819 Disorder of pigmentation, unspecified: Secondary | ICD-10-CM | POA: Diagnosis not present

## 2021-05-26 DIAGNOSIS — L218 Other seborrheic dermatitis: Secondary | ICD-10-CM | POA: Diagnosis not present

## 2021-05-26 DIAGNOSIS — L570 Actinic keratosis: Secondary | ICD-10-CM | POA: Diagnosis not present

## 2021-06-02 ENCOUNTER — Encounter: Payer: Self-pay | Admitting: Vascular Surgery

## 2021-06-02 ENCOUNTER — Other Ambulatory Visit: Payer: Self-pay

## 2021-06-02 ENCOUNTER — Ambulatory Visit: Payer: Medicare HMO | Admitting: Vascular Surgery

## 2021-06-02 VITALS — BP 141/93 | HR 89 | Temp 98.1°F | Resp 20 | Ht 69.0 in | Wt 223.0 lb

## 2021-06-02 DIAGNOSIS — I714 Abdominal aortic aneurysm, without rupture, unspecified: Secondary | ICD-10-CM

## 2021-06-02 NOTE — Progress Notes (Signed)
REASON FOR VISIT:   Follow-up after endovascular repair of abdominal aortic aneurysm  MEDICAL ISSUES:   S/P ENDOVASCULAR REPAIR OF ABDOMINAL AORTIC ANEURYSM: Patient is doing well status post endovascular repair of his abdominal aortic aneurysm.  The aneurysm has decreased in size slightly.  There is no evidence of endoleak.  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  On the left side this is stable in size at 2.4 cm.  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  He does have a patent right hypogastric artery which is stable in size.  I have ordered a follow-up CT angio of the chest abdomen and pelvis in 1 year and I will see him back at that time.  He knows to call sooner if he has problems.  HPI:   Philip [name] is a pleasant 80 y.o. male who returns for a 1 year follow-up visit.  I last had a virtual visit with him on 05/27/2020.  He underwent endovascular repair of a 7 sonometer abdominal aortic aneurysm using 4 components on 07/08/2014.  At that time the right hypogastric artery measured 1.4 cm in maximum diameter the left hypogastric was 2.2 cm.  At the time of his last visit his aneurysm has not increased in size and there was no evidence of endoleak.  The left hypogastric artery aneurysm had increased to 2.6 cm in maximum diameter.  I recommended a follow-up study in 1 year.  He comes in for that study.  The patient's chief complaint today is issues with his back and sciatica in his left leg.  He does describe some pain in his hips with ambulation and could potentially have some hip claudication as it looks like his left hypogastric artery is occluded.  However his right hypogastric artery is widely patent.  He is being worked up for his back issues.  I do not get any history of Claudication or rest pain.  He remains fairly active.  He is not a smoker.  Past Medical History:  Diagnosis Date   AAA (abdominal  aortic aneurysm) (HCC)    Atrial fibrillation (HCC)    CAD (coronary artery disease)    had a MI , s/p CABG   Cataracts, bilateral    immature   CHF (congestive heart failure) (HCC)    Dysrhythmia    paroximal a fib   GERD (gastroesophageal reflux disease)    takes Omeprazole daily   History of colon polyps    History of kidney stones    History of shingles    Hyperlipidemia    Hypertension    takes Amlodipine,Metoprolol,and Lisinopril daily   Joint pain    Joint swelling    Myocardial infarction ([place])    several with last one being 2001(but never knew about any except 2001)   OSA (obstructive sleep apnea)    started CPAP 12/09   Peripheral vascular disease ([place])    Pneumonia    as a child   Skin cancer    several , one of them was melanoma (sees derm routinely)   Tingling    feet occasionally   Tinnitus     Family History  Problem Relation Age of Onset   Prostate cancer Father 33   Heart disease Father    Skin cancer Father    Heart attack Father    Cancer Father    Deep vein thrombosis Father    Hyperlipidemia Father  Hypertension Father    Heart disease Mother    Skin cancer Mother    Heart attack Mother    Cancer Mother    Hyperlipidemia Mother    Hypertension Mother    Varicose Veins Mother    Peripheral vascular disease Mother        amputation   Sleep apnea Brother    Hyperlipidemia Brother    Hypertension Brother    Prostate cancer Brother 14   Colon cancer Other        aunt   Skin cancer Brother    Skin cancer Sister    Cancer Sister    Heart disease Sister    Diabetes Neg Hx    Stroke Neg Hx     SOCIAL HISTORY: Social History   Tobacco Use   Smoking status: Former    Types: Cigarettes    Quit date: 1979    Years since quitting: 43.6   Smokeless tobacco: Never   Tobacco comments:    quit smoking in 1979  Substance Use Topics   Alcohol use: Yes    Comment: occasionally - once per week    No Known Allergies  Current  Outpatient Medications  Medication Sig Dispense Refill   amLODipine (NORVASC) 10 MG tablet Take 1 tablet (10 mg total) by mouth daily. (Patient taking differently: Take 10 mg by mouth daily. At night) 90 tablet 3   apixaban (ELIQUIS) 5 MG TABS tablet Take 1 tablet (5 mg total) by mouth 2 (two) times daily. 180 tablet 1   Ascorbic Acid (VITAMIN C ER PO) Take by mouth.     atorvastatin (LIPITOR) 40 MG tablet TAKE 1 TABLET (40 MG TOTAL) BY MOUTH AT BEDTIME. 90 tablet 3   B Complex-Biotin-FA (B-COMPLEX PO) Take 1 tablet by mouth daily.     co-enzyme Q-10 30 MG capsule Take 30 mg by mouth daily.     hydrocortisone 2.5 % cream Apply 1 application topically as needed.     lisinopril (ZESTRIL) 20 MG tablet Take 1 tablet (20 mg total) by mouth daily. 90 tablet 3   magnesium oxide (MAG-OX) 400 MG tablet Take 800 mg by mouth daily.     Multiple Vitamins-Minerals (MULTIVITAMIN WITH MINERALS) tablet Take 1 tablet by mouth daily.     Omega-3 Fatty Acids (OMEGA-3 FISH OIL) 1200 MG CAPS Take 2 1,200 mg capsules daily 90 capsule 3   polyethylene glycol (MIRALAX / GLYCOLAX) packet Take 17 g by mouth daily.     No current facility-administered medications for this visit.    REVIEW OF SYSTEMS:  '[X]'$  denotes positive finding, '[ ]'$  denotes negative finding Cardiac  Comments:  Chest pain or chest pressure:    Shortness of breath upon exertion:    Short of breath when lying flat:    Irregular heart rhythm:        Vascular    Pain in calf, thigh, or hip brought on by ambulation:    Pain in feet at night that wakes you up from your sleep:     Blood clot in your veins:    Leg swelling:         Pulmonary    Oxygen at home:    Productive cough:     Wheezing:         Neurologic    Sudden weakness in arms or legs:     Sudden numbness in arms or legs:     Sudden onset of difficulty speaking or slurred speech:  Temporary loss of vision in one eye:     Problems with dizziness:         Gastrointestinal     Blood in stool:     Vomited blood:         Genitourinary    Burning when urinating:     Blood in urine:        Psychiatric    Major depression:         Hematologic    Bleeding problems:    Problems with blood clotting too easily:        Skin    Rashes or ulcers:        Constitutional    Fever or chills:     PHYSICAL EXAM:   Vitals:   06/02/21 0958  BP: (!) 141/93  Pulse: 89  Resp: 20  Temp: 98.1 F (36.7 C)  SpO2: 97%  Weight: 223 lb (101.2 kg)  Height: '5\' 9"'$  (1.753 m)    GENERAL: The patient is a well-nourished male, in no acute distress. The vital signs are documented above. CARDIAC: There is a regular rate and rhythm.  VASCULAR: I do not detect carotid bruits. He has palpable femoral pulses.  I cannot palpate pedal pulses. PULMONARY: There is good air exchange bilaterally without wheezing or rales. ABDOMEN: Soft and non-tender with normal pitched bowel sounds.  MUSCULOSKELETAL: There are no major deformities or cyanosis. NEUROLOGIC: No focal weakness or paresthesias are detected. SKIN: There are no ulcers or rashes noted. PSYCHIATRIC: The patient has a normal affect.  DATA:    CT ANGIO ABDOMEN PELVIS: I have reviewed the images of his CT angio of the abdomen and pelvis.  His aneurysm is slightly decreased in size.  At 6.5 cm in maximum diameter compared to 7 cm a year ago.  His endovascular graft is in excellent position with no evidence of endoleak.  The left hypogastric artery is unchanged in size.  It is 2.4 cm.  Year ago was measured at 2.6 cm.  It looks like this has thrombosed.  On the right side the hypogastric artery is widely patent and is stable in size at about 1.4 cm.  Deitra Mayo [facility] [phone]

## 2021-06-18 ENCOUNTER — Ambulatory Visit (INDEPENDENT_AMBULATORY_CARE_PROVIDER_SITE_OTHER): Payer: Medicare HMO | Admitting: Pharmacist

## 2021-06-18 DIAGNOSIS — E782 Mixed hyperlipidemia: Secondary | ICD-10-CM

## 2021-06-18 DIAGNOSIS — E039 Hypothyroidism, unspecified: Secondary | ICD-10-CM | POA: Diagnosis not present

## 2021-06-18 DIAGNOSIS — R739 Hyperglycemia, unspecified: Secondary | ICD-10-CM

## 2021-06-18 DIAGNOSIS — I2510 Atherosclerotic heart disease of native coronary artery without angina pectoris: Secondary | ICD-10-CM | POA: Diagnosis not present

## 2021-06-18 DIAGNOSIS — I480 Paroxysmal atrial fibrillation: Secondary | ICD-10-CM | POA: Diagnosis not present

## 2021-06-18 DIAGNOSIS — I10 Essential (primary) hypertension: Secondary | ICD-10-CM | POA: Diagnosis not present

## 2021-06-18 DIAGNOSIS — G609 Hereditary and idiopathic neuropathy, unspecified: Secondary | ICD-10-CM

## 2021-06-18 DIAGNOSIS — E038 Other specified hypothyroidism: Secondary | ICD-10-CM | POA: Diagnosis not present

## 2021-06-18 NOTE — Patient Instructions (Signed)
Visit Information  PATIENT GOALS:  Goals Addressed             This Visit's Progress    Chronic Care Management Pharmacy Care Plan   On track    CARE PLAN ENTRY (see longitudinal plan of care for additional care plan information)  Current Barriers:  Chronic Disease Management support, education, and care coordination needs related to Hypertension, Hyperlipidemia/CAD, Pre-Diabetes, AFib, Hypothyroidism, Chronic Constipation, Elevated PSA   Hypertension BP Readings from Last 3 Encounters:  06/02/21 (!) 141/93  04/21/21 136/66  04/07/21 140/71  Pharmacist Clinical Goal(s): Over the next 180 days, patient will work with PharmD and providers to maintain BP goal <130/80  Current regimen:  Amlodipine '10mg'$  daily in evening Lisinopril '20mg'$  daily in morning Interventions: Discussed blood pressure goal Patient self care activities - Over the next 180 days, patient will: Maintain blood pressure <130/80 Continue current blood pressure medication regimen  Hyperlipidemia Lab Results  Component Value Date/Time   LDLCALC 48 04/21/2021 09:38 AM   LDLCALC 66 09/09/2020 08:42 AM   LDLDIRECT 68.0 04/13/2015 08:23 AM  Pharmacist Clinical Goal(s): Over the next 180 days, patient will work with PharmD and providers to maintain LDL goal < 70 Current regimen:  Atorvastatin '40mg'$  daily Fish Oil '1200mg'$  - take 2 tablets daily CoEnzyme Q10 - '100mg'$  daily Interventions: Discussed LDL goal  Patient self care activities - Over the next 180 days, patient will: Maintain current cholesterol medication regimen.    Atrial Fibrillation / Stroke Prevention:  Pharmacist Clinical Goal(s): Over the next 180 days, patient will work with PharmD and providers to maintain heart rate in normal sinus rhythm and prevent stroke Current regimen:  Eliquis '5mg'$  twice a day Magnesium oxide '400mg'$  - take 2 tablets = '800mg'$  daily Interventions: Screened for patient assistance for Eliquis - not eligible for 2022.   Consider rechecking serum magnesium with next labs (might be able to lower / stop magnesium supplement)  Patient self care activities - Over the next 180 days, patient will: Continue Eliquis at current dose   Pre-Diabetes Lab Results  Component Value Date/Time   HGBA1C 6.0 04/21/2021 09:38 AM   HGBA1C 5.7 (H) 09/09/2020 08:42 AM  Pharmacist Clinical Goal(s): Over the next 180 days, patient will work with PharmD and providers to maintain A1c goal <6.5% Current regimen:  Diet and exercise management   Interventions: Discussed diet and exercise Discussed a1c goal Patient self care activities - Over the next 180 days, patient will: Maintain a1c less than 6.5%  Hypothyroidism / elevated TSH in past  Lab Results  Component Value Date/Time   TSH 4.20 04/21/2021 09:38 AM   TSH 3.520 09/09/2020 08:42 AM   FREET4 1.12 09/09/2020 08:42 AM   FREET4 1.24 04/27/2020 04:37 PM  Pharmacist Clinical Goal(s) Over the next 180 days, patient will work with PharmD and providers to reduce symptoms associated with hypothyroidism Current regimen:  none Patient self care activities - Over the next 180 days, patient will: Continue to have thyroid panel checked yearly Contact provider if any symptoms of low thyroid:  Increased in weight Intolerance to cold Dry skin Increase in fatigue  Hip and Leg Pain / Osteoarthritis of hip: Pharmacist Clinical Goal(s) Over the next 180 days, patient will work with PharmD and providers to reduce pain Current regimen:  none Patient self care activities - Over the next 180 days, patient will: Continue with plan to consult with neurolgist Continue with exercise 3 times per week - goal of at least 150 minutes  per week.   Medication management Pharmacist Clinical Goal(s): Over the next 180 days, patient will work with PharmD and providers to maintain optimal medication adherence Current pharmacy: [facility] Order Interventions Comprehensive medication  review performed. Continue current medication management strategy Patient self care activities - Over the next 180 days, patient will: Focus on medication adherence by filling and taking medications appropriately  Take medications as prescribed Report any questions or concerns to PharmD and/or provider(s)          The patient verbalized understanding of instructions, educational materials, and care plan provided today and declined offer to receive copy of patient instructions, educational materials, and care plan.   Telephone follow up appointment with care management team member scheduled for: 4 to 6 months  Cherre Robins, PharmD Clinical Pharmacist [facility]

## 2021-06-18 NOTE — Chronic Care Management (AMB) (Signed)
Chronic Care Management Pharmacy Note  06/18/2021 Name:  Philip [name] MRN:  [id number] DOB:  [birth year]  Subjective: Philip [name] is an 80 y.o. year old male who is a primary patient of [name], [name], MD.  The CCM team was consulted for assistance with disease management and care coordination needs.    Engaged with patient by telephone for follow up visit in response to provider referral for pharmacy case management and/or care coordination services.   Consent to Services:  The patient was given information about Chronic Care Management services, agreed to services, and gave verbal consent prior to initiation of services.  Please see initial visit note for detailed documentation.   Patient Care Team: Colon Branch, MD as PCP - General [name], MD as PCP - Cardiology (Cardiology) Ardis Hughs, MD as Attending Physician (Urology) Angelia Mould, MD as Consulting Physician (Vascular Surgery) Rigoberto Noel, MD as Consulting Physician (Pulmonary Disease) Druscilla Brownie, MD as Consulting Physician (Dermatology) Wilford Corner, MD as Consulting Physician (Gastroenterology) Eppie Gibson, MD as Attending Physician (Radiation Oncology) Malmfelt, Stephani Police, RN as Oncology Nurse Navigator Cherre Robins, PharmD (Pharmacist)  Recent office visits: 04/21/2021 - PCP (Dr Larose Kells) F/U chronic conditions. Offered trial of gabapentin for neuropthy - patient declined. Labs checked. Note to have slightly low WBC and platlets. PSA elevated but stable. All other labs at goal or WNL  Recent consult visits: 06/02/2021 - Vascular Surgery ([name]) F/U AAA repair. Stable. F/U 1 year. No med changes.  04/07/2021 - Vein and Vascular ([place], PAC) f/u AAA and lower extremity discomfort. No med changes.  02/09/2021 - Cardio (Dr Irish Lack) - f/u CAD and PAF. No medication changes noted. 03/11/02022 - Pulm (dr [name]) - F/U Sleep apnea and reestablish care; Using CPAP nightly. New  CPAP ordered. No med changes. 12/30/2020 - Vascular and Vein Specilists ([place], PAC) seen for claudication. Recommended walking program and started Pletal to see if these will offer any relief.  F/U in 3 months with repeat ABIs. 12/16/2020 - Ortho (Dr Gladstone Lighter) Seen for vetebrogenic low back pain.  Hospital visits: None in previous 6 months  Objective:  Lab Results  Component Value Date   CREATININE 0.98 04/21/2021   CREATININE 0.89 09/09/2020   CREATININE 0.98 03/02/2020    Lab Results  Component Value Date   HGBA1C 6.0 04/21/2021   Last diabetic Eye exam: No results found for: HMDIABEYEEXA  Last diabetic Foot exam: No results found for: HMDIABFOOTEX      Component Value Date/Time   CHOL 125 04/21/2021 0938   CHOL 137 09/09/2020 0842   TRIG 136.0 04/21/2021 0938   TRIG 256 09/14/2009 0000   HDL 49.30 04/21/2021 0938   HDL 49 09/09/2020 0842   CHOLHDL 3 04/21/2021 0938   VLDL 27.2 04/21/2021 0938   LDLCALC 48 04/21/2021 0938   LDLCALC 66 09/09/2020 0842   LDLDIRECT 68.0 04/13/2015 0823    Hepatic Function Latest Ref Rng & Units 04/21/2021 09/09/2020 03/02/2020  Total Protein 6.0 - 8.3 g/dL 7.0 6.9 6.6  Albumin 3.5 - 5.2 g/dL 4.8 4.7 4.6  AST 0 - 37 U/L 29 32 24  ALT 0 - 53 U/L 25 27 24   Alk Phosphatase 39 - 117 U/L 63 65 56  Total Bilirubin 0.2 - 1.2 mg/dL 0.8 0.8 0.8    Lab Results  Component Value Date/Time   TSH 4.20 04/21/2021 09:38 AM   TSH 3.520 09/09/2020 08:42 AM   FREET4 1.12 09/09/2020  08:42 AM   FREET4 1.24 04/27/2020 04:37 PM    CBC Latest Ref Rng & Units 04/21/2021 04/24/2020 08/14/2019  WBC 4.0 - 10.5 K/uL 3.9(L) 3.5(L) 3.6  Hemoglobin 13.0 - 17.0 g/dL 14.7 14.4 14.8  Hematocrit 39.0 - 52.0 % 43.8 42.4 42.5  Platelets 150.0 - 400.0 K/uL 139.0(L) 131.0(L) 158    Lab Results  Component Value Date/Time   VD25OH 58.4 09/09/2020 08:42 AM   VD25OH 83.4 03/02/2020 12:50 PM   VD25OH 41.32 04/22/2014 04:23 PM    Clinical ASCVD: Yes  The ASCVD  Risk score Mikey Bussing DC Jr., et al., 2013) failed to calculate for the following reasons:   The 2013 ASCVD risk score is only valid for ages 34 to 9    Other: CHADS2VASc = 4  Social History   Tobacco Use  Smoking Status Former   Types: Cigarettes   Quit date: 1979   Years since quitting: 43.6  Smokeless Tobacco Never  Tobacco Comments   quit smoking in 1979   BP Readings from Last 3 Encounters:  06/02/21 (!) 141/93  04/21/21 136/66  04/07/21 140/71   Pulse Readings from Last 3 Encounters:  06/02/21 89  04/21/21 66  04/07/21 75   Wt Readings from Last 3 Encounters:  06/02/21 223 lb (101.2 kg)  04/21/21 220 lb 6 oz (100 kg)  04/07/21 225 lb 11.2 oz (102.4 kg)    Assessment: Review of patient past medical history, allergies, medications, health status, including review of consultants reports, laboratory and other test data, was performed as part of comprehensive evaluation and provision of chronic care management services.   SDOH:  (Social Determinants of Health) assessments and interventions performed:  SDOH Interventions    Flowsheet Row Most Recent Value  SDOH Interventions   Food Insecurity Interventions Intervention Not Indicated       CCM Care Plan  No Known Allergies  Medications Reviewed Today     Reviewed by Cherre Robins, PharmD (Pharmacist) on 06/18/21 at 0850  Med List Status: <None>   Medication Order Taking? Sig Documenting Provider Last Dose Status Informant  amLODipine (NORVASC) 10 MG tablet [id number] Yes Take 1 tablet (10 mg total) by mouth daily.  Patient taking differently: Take 10 mg by mouth daily. At night   [name], MD Taking Active   apixaban (ELIQUIS) 5 MG TABS tablet [id number] Yes Take 1 tablet (5 mg total) by mouth 2 (two) times daily. [name], MD Taking Active   Ascorbic Acid (VITAMIN C ER PO) [id number]  Take by mouth. [provider]  Active   atorvastatin (LIPITOR) 40 MG tablet [id number] Yes TAKE 1  TABLET (40 MG TOTAL) BY MOUTH AT BEDTIME. [name], MD Taking Active   B Complex-Biotin-FA (B-COMPLEX PO) [id number] Yes Take 1 tablet by mouth daily. [provider] Taking Active Self  co-enzyme Q-10 30 MG capsule [id number] Yes Take 30 mg by mouth daily. [provider] Taking Active Self  hydrocortisone 2.5 % cream [id number] Yes Apply 1 application topically as needed. [provider] Taking Active   lisinopril (ZESTRIL) 20 MG tablet [id number] Yes Take 1 tablet (20 mg total) by mouth daily. [name], MD Taking Active   magnesium oxide (MAG-OX) 400 MG tablet [id number] Yes Take 800 mg by mouth daily. [provider] Taking Active Self  Multiple Vitamins-Minerals (MULTIVITAMIN WITH MINERALS) tablet [id number] Yes Take 1 tablet by mouth daily. [provider] Taking Active Self  Omega-3 Fatty Acids (OMEGA-3 FISH OIL) 1200  MG CAPS [id number] Yes Take 2 1,200 mg capsules daily End, Christopher, MD Taking Active   polyethylene glycol (MIRALAX / GLYCOLAX) packet [id number]  Take 17 g by mouth daily. [provider]  Active             Patient Active Problem List   Diagnosis Date Noted   Skin cancer of face 08/05/2020   Depression 04/02/2019   Lower extremity edema 12/22/2017   Coronary artery disease involving native coronary artery of native heart without angina pectoris 12/22/2017   Other specified hypothyroidism 10/02/2017   Osteoarthritis of left hip 06/26/2017   Class 1 obesity with serious comorbidity and body mass index (BMI) of 30.0 to 30.9 in adult 05/08/2017   Hyperglycemia 04/19/2017   Vitamin D deficiency 03/29/2017   Idiopathic peripheral neuropathy 03/13/2017   Erectile dysfunction 02/22/2017   PCP NOTES >>>>>>>>>>>>>>>>>>>>>>>>>>>>>>>>>>>>>>> 12/21/2015   Chronic constipation 12/21/2015   Atrial fibrillation ([place]) 05/07/2014   Encounter for pre-operative cardiovascular clearance 05/07/2014   AAA  (abdominal aortic aneurysm) without rupture ([place]) 04/30/2014   Fatigue 11/25/2013   Hypomagnesemia 06/29/2011   Annual physical exam 02/16/2011   Insomnia 02/16/2011   OSA (obstructive sleep apnea)    History of skin cancer 07/24/2008   Coronary atherosclerosis 07/04/2008   Mixed hyperlipidemia 02/08/2007   Essential hypertension 02/08/2007    Immunization History  Administered Date(s) Administered   Fluad Quad(high Dose 65+) 08/19/2020   H1N1 11/06/2008   Influenza Split 09/15/2011, 09/14/2012   Influenza Whole 08/06/2010   Influenza, High Dose Seasonal PF 09/16/2015, 09/26/2016, 09/01/2017, 09/03/2018, 08/29/2019   Influenza,inj,Quad PF,6+ Mos 10/01/2013   Influenza-Unspecified 08/31/2014   PFIZER(Purple Top)SARS-COV-2 Vaccination 12/07/2019, 01/01/2020   Pneumococcal Conjugate-13 04/22/2014   Pneumococcal Polysaccharide-23 01/01/2010, 10/18/2017   Td 04/22/2014   Zoster Recombinat (Shingrix) 09/04/2018   Zoster, Live 10/31/2012    Conditions to be addressed/monitored: Atrial Fibrillation, CAD, HTN, HLD and Chronic hip and leg pain; PAD; s/p AAA repair; idiopathic periphireal neuropathy; prediabetes; hypothyroidism; chronic constipation  Care Plan : General Pharmacy (Adult)  Updates made by Cherre Robins, PHARMD since 06/18/2021 12:00 AM     Problem: Pre DM; HTN; hyperlipidemia, mixed; AAA; afib; neuropathy / hip pain; chronic constipation   Priority: High  Onset Date: 02/24/2021  Note:   Current Barriers:  Does not maintain contact with provider office ([name]) Chronic Disease Management support, education, and care coordination needs related to Hypertension, Hyperlipidemia/CAD, Pre-Diabetes, AFib, Hypothyroidism, Chronic Constipation, chronic pain, osteoarthritis of hip; idiopathic peripheral neuropathy.  Pharmacist Clinical Goal(s):  Over the next 180 days, patient will achieve adherence to monitoring guidelines and medication adherence to achieve therapeutic  efficacy maintain control of blood pressure and lipids as evidenced by maintaining goals below  contact provider office for questions/concerns as evidenced notation of same in electronic health record Recheck TSH and restart levothyroxine if needed  through collaboration with PharmD and provider.   Interventions: 1:1 collaboration with Colon Branch, MD regarding development and update of comprehensive plan of care as evidenced by provider attestation and co-signature Inter-disciplinary care team collaboration (see longitudinal plan of care) Comprehensive medication review performed; medication list updated in electronic medical record  Hypertension - BP goal <130/80 (due to aneurysm) BP Readings from Last 3 Encounters:  02/09/21 140/82  01/08/21 132/84  12/30/20 137/73  Currently BP slightly above goal in office but home BP readings are at goal Home BP readings per patient: 130's / 60-75 Current regimen:  Amlodipine 56m daily in evening Lisinopril 273mdaily in  morning Interventions: Discussed blood pressure goal Continue current blood pressure medication regimen  Hyperlipidemia - LDL goal < 70; Tg goal <150 LDL and Triglycerides are currently at goal  Current regimen:  Atorvastatin 38m daily Fish Oil 12062m- take 2 tablets daily CoEnzyme Q10 - 10074maily Interventions: Discussed LDL goal Maintain current cholesterol medication regimen.   Atrial Fibrillation / Stroke Prevention:  Controlled CHADSVasc = 4 Current regimen:  Eliquis 5mg33mice a day Magnesium oxide 400mg67make 2 tablets = 800mg 68my Interventions: At 01/2021 visit screened for patient assistance for Eliquis - not eligible for 2022.  Consider rechecking serum magnesium with next labs (might be able to lower / stop magnesium supplement)  Continue Eliquis at current dose  Pre-Diabetes- A1c goal <6.5% Current controlled  Current regimen:  Diet and exercise management   Interventions: Reviewed diet and  exercise Reviewed a1c goal   Hypothyroidism TSH in therapeutic range (03/2021) Patient stopped levothyroxine 25mcg 23march 2022 because he felt was not needed and could only get 30 day supply at a time.  Current regimen:  None Interventions: Continue to follow TSH Continue without pharmacotherapy since TSH was WNL  Hip and Leg Pain / PAD / Osteoarthritis of hip: Uncontrolled Medications tried - acetaminophen (not helpful); Aleve / naproxen (helped with pain but was instructed not to take due to risk of bleeding with Eliquis and oral NSAIDs); diclofenac 1% topical gel (minimal effecacy) At visit 12/30/2020 with vein and vascular; recommended trial of cilostazol. Patient took for 30 days but did not see any improvement in leg pain Has seen ortho for hip pain, tried PT and joint injections with limit efficacy. Has requested appointment with neurologist and is awaiting call back for appt.  Current regimen:  none Interventions: Continue with plan to consult with neurologist Continue with exercise 3 to 4 times per week - goal of at least 150 minutes per week.   Medication management Current pharmacy: [facility] Interventions Comprehensive medication review performed. Medication list updated Reviewed recent refill history; discussed adherence issues with patient  Continue current medication management strategy Focus on medication adherence by filling and taking medications appropriately  Report any questions or concerns to PharmD and/or provider(s)   Patient Goals/Self-Care Activities Over the next 180 days, patient will:  take medications as prescribed, check blood pressure once per week, document, and provide at future appointments, collaborate with provider on medication access solutions, and target a minimum of 150 minutes of moderate intensity exercise weekly  Follow Up Plan: Telephone follow up appointment with care management team member scheduled for:  4 to 6 months             Medication Assistance: Screened patient for Eliquis / [place] assistance 01/2021 but he did not meet income requirement for 2022.   [facility] [address] [place] ([place]) [place] [address] [id number] [phone] [facility] [id number] [phone] Up:  Patient agrees to Care Plan and Follow-up.      Telephone follow up appointment with care management team member scheduled for:  4 to 6 months  Nysia Dell ECherre RobinsD Clinical Pharmacist [facility]

## 2021-06-19 ENCOUNTER — Other Ambulatory Visit: Payer: Self-pay | Admitting: Interventional Cardiology

## 2021-06-19 DIAGNOSIS — I2510 Atherosclerotic heart disease of native coronary artery without angina pectoris: Secondary | ICD-10-CM

## 2021-06-21 NOTE — Telephone Encounter (Signed)
Eliquis '5mg'$  refill request received. Patient is 80 years old, weight-101.2kg, Crea-0.98 on 04/21/21, Diagnosis-Afib, and last seen by Dr. Irish Lack on 02/09/21. Dose is appropriate based on dosing criteria. Will send in refill to requested pharmacy.

## 2021-06-22 ENCOUNTER — Telehealth: Payer: Medicare HMO

## 2021-06-23 DIAGNOSIS — I739 Peripheral vascular disease, unspecified: Secondary | ICD-10-CM | POA: Diagnosis not present

## 2021-06-23 DIAGNOSIS — M47816 Spondylosis without myelopathy or radiculopathy, lumbar region: Secondary | ICD-10-CM | POA: Diagnosis not present

## 2021-08-23 ENCOUNTER — Ambulatory Visit (INDEPENDENT_AMBULATORY_CARE_PROVIDER_SITE_OTHER): Payer: Medicare HMO | Admitting: Internal Medicine

## 2021-08-23 ENCOUNTER — Encounter: Payer: Self-pay | Admitting: Internal Medicine

## 2021-08-23 ENCOUNTER — Other Ambulatory Visit: Payer: Self-pay

## 2021-08-23 VITALS — BP 145/80 | HR 65 | Temp 98.0°F | Resp 16 | Ht 69.0 in | Wt 220.5 lb

## 2021-08-23 DIAGNOSIS — E079 Disorder of thyroid, unspecified: Secondary | ICD-10-CM

## 2021-08-23 DIAGNOSIS — Z23 Encounter for immunization: Secondary | ICD-10-CM | POA: Diagnosis not present

## 2021-08-23 DIAGNOSIS — I10 Essential (primary) hypertension: Secondary | ICD-10-CM | POA: Diagnosis not present

## 2021-08-23 DIAGNOSIS — R739 Hyperglycemia, unspecified: Secondary | ICD-10-CM

## 2021-08-23 LAB — BASIC METABOLIC PANEL
BUN: 19 mg/dL (ref 6–23)
CO2: 27 mEq/L (ref 19–32)
Calcium: 9.8 mg/dL (ref 8.4–10.5)
Chloride: 103 mEq/L (ref 96–112)
Creatinine, Ser: 0.94 mg/dL (ref 0.40–1.50)
GFR: 76.16 mL/min (ref >60.00)
Glucose, Bld: 103 mg/dL — ABNORMAL HIGH (ref 70–99)
Potassium: 5 mEq/L (ref 3.5–5.1)
Sodium: 139 mEq/L (ref 135–145)

## 2021-08-23 LAB — T4, FREE: Free T4: 0.69 ng/dL (ref 0.60–1.60)

## 2021-08-23 LAB — TSH: TSH: 6.06 u[IU]/mL — ABNORMAL HIGH (ref 0.35–5.50)

## 2021-08-23 LAB — HEMOGLOBIN A1C: Hgb A1c MFr Bld: 5.9 % (ref 4.6–6.5)

## 2021-08-23 NOTE — Patient Instructions (Addendum)
Recommend to proceed with the following vaccines at your pharmacy:  Shingrix (shingles) #2 New Covid booster   Check the  blood pressure 2 or 3 times a month week monthly weekly daily BP GOAL is between 110/65 and  135/85. If it is consistently higher or lower, let me know    GO TO THE LAB : Get the blood work     [place], [place] back for a checkup in 4 to 6 months

## 2021-08-23 NOTE — Progress Notes (Signed)
Subjective:    Patient ID: Philip [name], male    DOB: Sep 03, 1941, 80 y.o.   MRN: [id number]  DOS:  08/23/2021 Type of visit - description: Follow-up  Since the last office visit is doing well and has no major concerns. He remains very active, goes to the gym 3 times a week. Ambulatory BPs are normal. Neuropathy and back pain: Unchanged. Today we talk about immunizations  Review of Systems See above   Past Medical History:  Diagnosis Date   AAA (abdominal aortic aneurysm)    Atrial fibrillation (HCC)    CAD (coronary artery disease)    had a MI , s/p CABG   Cataracts, bilateral    immature   CHF (congestive heart failure) (HCC)    Dysrhythmia    paroximal a fib   GERD (gastroesophageal reflux disease)    takes Omeprazole daily   History of colon polyps    History of kidney stones    History of shingles    Hyperlipidemia    Hypertension    takes Amlodipine,Metoprolol,and Lisinopril daily   Joint pain    Joint swelling    Myocardial infarction ([place])    several with last one being 2001(but never knew about any except 2001)   OSA (obstructive sleep apnea)    started CPAP 12/09   Peripheral vascular disease ([place])    Pneumonia    as a child   Skin cancer    several , one of them was melanoma (sees derm routinely)   Tingling    feet occasionally   Tinnitus     Past Surgical History:  Procedure Laterality Date   ABDOMINAL AORTAGRAM N/A 06/16/2014   Procedure: ABDOMINAL Maxcine Ham;  Surgeon: Angelia Mould, MD;  Location: [facility] CATH LAB;  Service: Cardiovascular;  Laterality: N/A;   ABDOMINAL AORTIC ENDOVASCULAR STENT GRAFT N/A 07/08/2014   Procedure: ABDOMINAL AORTIC ENDOVASCULAR STENT GRAFT/ GORE;  Surgeon: Angelia Mould, MD;  Location: [facility];  Service: Vascular;  Laterality: N/A;   CARDIAC CATHETERIZATION  2015   COLONOSCOPY     CORONARY ARTERY BYPASS GRAFT  1999   x 3 vessels   LEFT HEART CATHETERIZATION WITH CORONARY/GRAFT ANGIOGRAM N/A 05/29/2014    Procedure: LEFT HEART CATHETERIZATION WITH [name];  Surgeon: [name], MD;  Location[facility] C[facility] LAB;  Service: Cardiovascular;  Laterality: N/A;   PENILE PROSTHESIS IMPLANT  08/2005   RHINOPLASTY  1975    Allergies as of 08/23/2021   No Known Allergies      Medication List        Accurate as of August 23, 2021 11:59 PM. If you have any questions, ask your nurse or doctor.          amLODipine 10 MG tablet Commonly known as: NORVASC Take 1 tablet (10 mg total) by mouth daily. What changed: additional instructions   atorvastatin 40 MG tablet Commonly known as: LIPITOR TAKE 1 TABLET (40 MG TOTAL) BY MOUTH AT BEDTIME.   B-COMPLEX PO Take 1 tablet by mouth daily.   co-enzyme Q-10 30 MG capsule Take 30 mg by mouth daily.   Eliquis 5 MG Tabs tablet Generic drug: apixaban TAKE 1 TABLET TWICE DAILY   hydrocortisone 2.5 % cream Apply 1 application topically as needed.   lisinopril 20 MG tablet Commonly known as: ZESTRIL Take 1 tablet (20 mg total) by mouth daily.   magnesium oxide 400 MG tablet Commonly known as: MAG-OX Take 800 mg by mouth daily.   multivitamin  with minerals tablet Take 1 tablet by mouth daily.   Omega-3 Fish Oil 1200 MG Caps Take 2 1,200 mg capsules daily   polyethylene glycol 17 g packet Commonly known as: MIRALAX / GLYCOLAX Take 17 g by mouth daily.   VITAMIN C ER PO Take by mouth.           Objective:   Physical Exam BP (!) 145/80   Pulse 65   Temp 98 F (36.7 C) (Oral)   Resp 16   Ht 5\' 9"  (1.753 m)   Wt 220 lb 8 oz (100 kg)   SpO2 97%   BMI 32.56 kg/m  General:   Well developed, NAD, BMI noted. HEENT:  Normocephalic . Face symmetric, atraumatic Lungs:  CTA B Normal respiratory effort, no intercostal retractions, no accessory muscle use. Heart: Seems regular today.  Lower extremities: no pretibial edema bilaterally  Skin: Not pale. Not jaundice Neurologic:  alert & oriented X3.  Speech  normal, gait appropriate for age and unassisted Psych--  Cognition and judgment appear intact.  Cooperative with normal attention span and concentration.  Behavior appropriate. No anxious or depressed appearing.      Assessment     Assessment Hyperglycemia: A1c 6.1  12/2016 Neuropathy : saw neuro 5-18, likely idiopathic, declined NCS; also UE entrapment neuropathy likely HTN Hyperlipidemia Obesity- 1st visit [name] 01-18-17 CV: ---CAD >>>  MI, CABG   ---CHF ---Peripheral vascular disease ---AAA - see procedures , [name]  --- A. Fib, paroxysmal  GERD  Chronic constipation OSA- Cpap (Dr [name]) ED- penile implant H/o skin cancer, Dr. [name] H/o Kidney stones 2016  PLAN:  Hyperglycemia: Doing great with lifestyle, check A1c Neuropathy, back pain: Unchanged HTN: On amlodipine, lisinopril, BP upon arrival elevated, I recheck 145/80.  Check BMP, continue monitoring at home. H/o Elevated TSH: Labs Preventive care: Recommend [place], he is hesitant, pros>>cons Skin cancer: Sees dermatologist regularly RTC 4 to 6 months    This visit occurred during the SARS-CoV-2 public health emergency.  Safety protocols were in place, including screening questions prior to the visit, additional usage of staff PPE, and extensive cleaning of exam room while observing appropriate contact time as indicated for disinfecting solutions.

## 2021-08-24 NOTE — Assessment & Plan Note (Addendum)
Hyperglycemia: Doing great with lifestyle, check A1c Neuropathy, back pain: Unchanged HTN: On amlodipine, lisinopril, BP upon arrival elevated, I recheck 145/80.  Check BMP, continue monitoring at home. H/o Elevated TSH: Labs Preventive care: Recommend [place], he is hesitant, pros>>cons Skin cancer: Sees dermatologist regularly RTC 4 to 6 months

## 2021-09-24 ENCOUNTER — Other Ambulatory Visit: Payer: Self-pay | Admitting: Interventional Cardiology

## 2021-10-04 ENCOUNTER — Telehealth: Payer: Self-pay | Admitting: Pharmacist

## 2021-10-04 NOTE — Chronic Care Management (AMB) (Signed)
    Chronic Care Management Pharmacy Assistant   Name: JASDEEP KEPNER  MRN: [id number] DOB: [birth year]  Reason for Encounter: Disease State Hypertension   Recent office visits:  08/23/21-Jose Ladona Horns, MD (PCP) General follow up visit. Labs ordered. Follow up in 5 months.  Recent consult visits:  06/23/21-(Neurosurgery) Pieter Partridge C. [place]. Notes not available.  Hospital visits:  None in previous 6 months  Medications: Outpatient Encounter Medications as of 10/04/2021  Medication Sig   amLODipine (NORVASC) 10 MG tablet Take 1 tablet (10 mg total) by mouth daily. At night   Ascorbic Acid (VITAMIN C ER PO) Take by mouth.   atorvastatin (LIPITOR) 40 MG tablet TAKE 1 TABLET (40 MG TOTAL) BY MOUTH AT BEDTIME.   B Complex-Biotin-FA (B-COMPLEX PO) Take 1 tablet by mouth daily.   co-enzyme Q-10 30 MG capsule Take 30 mg by mouth daily.   ELIQUIS 5 MG TABS tablet TAKE 1 TABLET TWICE DAILY   hydrocortisone 2.5 % cream Apply 1 application topically as needed.   lisinopril (ZESTRIL) 20 MG tablet Take 1 tablet (20 mg total) by mouth daily.   magnesium oxide (MAG-OX) 400 MG tablet Take 800 mg by mouth daily.   Multiple Vitamins-Minerals (MULTIVITAMIN WITH MINERALS) tablet Take 1 tablet by mouth daily.   Omega-3 Fatty Acids (OMEGA-3 FISH OIL) 1200 MG CAPS Take 2 1,200 mg capsules daily   polyethylene glycol (MIRALAX / GLYCOLAX) packet Take 17 g by mouth daily.   No facility-administered encounter medications on file as of 10/04/2021.   Current antihypertensive regimen:  Amlodipine 10mg  daily in evening Lisinopril 20mg  daily in morning  How often are you checking your Blood Pressure? 1-2x per week  Current home BP readings:         136/66-Unknown date        124/65-Unknown date        156/66-Most recent reading  What recent interventions/DTPs have been made by any provider to improve Blood Pressure control since last CPP Visit: None noted  Any recent hospitalizations or ED visits since last  visit with CPP? No  What diet changes have been made to improve Blood Pressure Control?  Patient states he has not made any diet changes  What exercise is being done to improve your Blood Pressure Control?  Patient does exercise 30-45 mins every other day of the week.  Adherence Review: Is the patient currently on ACE/ARB medication? Yes Does the patient have >5 day gap between last estimated fill dates? No   Star Rating Drugs: Lisinopril 20 mg Last filled:09/25/21 90 DS Atorvastatin 40 mg Last filled: 09/25/21 90 DS  Myriam Elta [place], [place]

## 2021-10-06 ENCOUNTER — Emergency Department (HOSPITAL_COMMUNITY): Payer: Medicare HMO

## 2021-10-06 ENCOUNTER — Inpatient Hospital Stay (HOSPITAL_COMMUNITY)
Admission: EM | Admit: 2021-10-06 | Discharge: 2021-10-08 | DRG: 281 | Disposition: A | Discharge status: Home / Self Care | Payer: Medicare HMO | Attending: Internal Medicine | Admitting: Internal Medicine

## 2021-10-06 ENCOUNTER — Encounter (HOSPITAL_COMMUNITY): Payer: Self-pay

## 2021-10-06 ENCOUNTER — Other Ambulatory Visit: Payer: Self-pay

## 2021-10-06 DIAGNOSIS — I77810 Thoracic aortic ectasia: Secondary | ICD-10-CM | POA: Diagnosis not present

## 2021-10-06 DIAGNOSIS — R55 Syncope and collapse: Secondary | ICD-10-CM | POA: Diagnosis not present

## 2021-10-06 DIAGNOSIS — R42 Dizziness and giddiness: Secondary | ICD-10-CM

## 2021-10-06 DIAGNOSIS — I2582 Chronic total occlusion of coronary artery: Secondary | ICD-10-CM | POA: Diagnosis present

## 2021-10-06 DIAGNOSIS — Z6831 Body mass index (BMI) 31.0-31.9, adult: Secondary | ICD-10-CM | POA: Diagnosis not present

## 2021-10-06 DIAGNOSIS — Z7901 Long term (current) use of anticoagulants: Secondary | ICD-10-CM

## 2021-10-06 DIAGNOSIS — R079 Chest pain, unspecified: Secondary | ICD-10-CM | POA: Diagnosis not present

## 2021-10-06 DIAGNOSIS — I2510 Atherosclerotic heart disease of native coronary artery without angina pectoris: Secondary | ICD-10-CM | POA: Diagnosis present

## 2021-10-06 DIAGNOSIS — R778 Other specified abnormalities of plasma proteins: Secondary | ICD-10-CM | POA: Diagnosis not present

## 2021-10-06 DIAGNOSIS — R001 Bradycardia, unspecified: Secondary | ICD-10-CM | POA: Diagnosis present

## 2021-10-06 DIAGNOSIS — I700 Atherosclerosis of aorta: Secondary | ICD-10-CM | POA: Diagnosis not present

## 2021-10-06 DIAGNOSIS — I252 Old myocardial infarction: Secondary | ICD-10-CM

## 2021-10-06 DIAGNOSIS — I480 Paroxysmal atrial fibrillation: Secondary | ICD-10-CM | POA: Diagnosis present

## 2021-10-06 DIAGNOSIS — Z79899 Other long term (current) drug therapy: Secondary | ICD-10-CM | POA: Diagnosis not present

## 2021-10-06 DIAGNOSIS — R404 Transient alteration of awareness: Secondary | ICD-10-CM | POA: Diagnosis not present

## 2021-10-06 DIAGNOSIS — I714 Abdominal aortic aneurysm, without rupture, unspecified: Secondary | ICD-10-CM | POA: Diagnosis present

## 2021-10-06 DIAGNOSIS — Z85820 Personal history of malignant melanoma of skin: Secondary | ICD-10-CM

## 2021-10-06 DIAGNOSIS — Z87891 Personal history of nicotine dependence: Secondary | ICD-10-CM

## 2021-10-06 DIAGNOSIS — I4891 Unspecified atrial fibrillation: Secondary | ICD-10-CM | POA: Diagnosis present

## 2021-10-06 DIAGNOSIS — Z83438 Family history of other disorder of lipoprotein metabolism and other lipidemia: Secondary | ICD-10-CM | POA: Diagnosis not present

## 2021-10-06 DIAGNOSIS — E872 Acidosis, unspecified: Secondary | ICD-10-CM | POA: Diagnosis present

## 2021-10-06 DIAGNOSIS — Z8249 Family history of ischemic heart disease and other diseases of the circulatory system: Secondary | ICD-10-CM

## 2021-10-06 DIAGNOSIS — R7989 Other specified abnormal findings of blood chemistry: Secondary | ICD-10-CM | POA: Diagnosis present

## 2021-10-06 DIAGNOSIS — I110 Hypertensive heart disease with heart failure: Secondary | ICD-10-CM | POA: Diagnosis not present

## 2021-10-06 DIAGNOSIS — Z20822 Contact with and (suspected) exposure to covid-19: Secondary | ICD-10-CM | POA: Diagnosis present

## 2021-10-06 DIAGNOSIS — I10 Essential (primary) hypertension: Secondary | ICD-10-CM | POA: Diagnosis not present

## 2021-10-06 DIAGNOSIS — I517 Cardiomegaly: Secondary | ICD-10-CM | POA: Diagnosis not present

## 2021-10-06 DIAGNOSIS — K5909 Other constipation: Secondary | ICD-10-CM | POA: Diagnosis present

## 2021-10-06 DIAGNOSIS — E782 Mixed hyperlipidemia: Secondary | ICD-10-CM | POA: Diagnosis present

## 2021-10-06 DIAGNOSIS — I959 Hypotension, unspecified: Secondary | ICD-10-CM | POA: Diagnosis not present

## 2021-10-06 DIAGNOSIS — R4182 Altered mental status, unspecified: Secondary | ICD-10-CM | POA: Diagnosis not present

## 2021-10-06 DIAGNOSIS — G4733 Obstructive sleep apnea (adult) (pediatric): Secondary | ICD-10-CM | POA: Diagnosis present

## 2021-10-06 DIAGNOSIS — N400 Enlarged prostate without lower urinary tract symptoms: Secondary | ICD-10-CM | POA: Diagnosis not present

## 2021-10-06 DIAGNOSIS — Z951 Presence of aortocoronary bypass graft: Secondary | ICD-10-CM

## 2021-10-06 DIAGNOSIS — R531 Weakness: Secondary | ICD-10-CM

## 2021-10-06 DIAGNOSIS — I723 Aneurysm of iliac artery: Secondary | ICD-10-CM | POA: Diagnosis not present

## 2021-10-06 DIAGNOSIS — E669 Obesity, unspecified: Secondary | ICD-10-CM | POA: Diagnosis present

## 2021-10-06 DIAGNOSIS — I5032 Chronic diastolic (congestive) heart failure: Secondary | ICD-10-CM | POA: Diagnosis present

## 2021-10-06 DIAGNOSIS — N2889 Other specified disorders of kidney and ureter: Secondary | ICD-10-CM | POA: Diagnosis present

## 2021-10-06 DIAGNOSIS — K219 Gastro-esophageal reflux disease without esophagitis: Secondary | ICD-10-CM | POA: Diagnosis present

## 2021-10-06 DIAGNOSIS — E039 Hypothyroidism, unspecified: Secondary | ICD-10-CM | POA: Diagnosis present

## 2021-10-06 DIAGNOSIS — I719 Aortic aneurysm of unspecified site, without rupture: Secondary | ICD-10-CM | POA: Diagnosis not present

## 2021-10-06 DIAGNOSIS — I3481 Nonrheumatic mitral (valve) annulus calcification: Secondary | ICD-10-CM | POA: Diagnosis present

## 2021-10-06 DIAGNOSIS — I214 Non-ST elevation (NSTEMI) myocardial infarction: Principal | ICD-10-CM | POA: Diagnosis present

## 2021-10-06 DIAGNOSIS — I491 Atrial premature depolarization: Secondary | ICD-10-CM | POA: Diagnosis not present

## 2021-10-06 DIAGNOSIS — Z955 Presence of coronary angioplasty implant and graft: Secondary | ICD-10-CM

## 2021-10-06 LAB — COMPREHENSIVE METABOLIC PANEL
ALT: 25 U/L (ref 0–44)
AST: 29 U/L (ref 15–41)
Albumin: 4.2 g/dL (ref 3.5–5.0)
Alkaline Phosphatase: 60 U/L (ref 38–126)
Anion gap: 13 (ref 5–15)
BUN: 18 mg/dL (ref 8–23)
CO2: 23 mmol/L (ref 22–32)
Calcium: 9.6 mg/dL (ref 8.9–10.3)
Chloride: 101 mmol/L (ref 98–111)
Creatinine, Ser: 1.08 mg/dL (ref 0.61–1.24)
GFR, Estimated: >60 mL/min (ref >60)
Glucose, Bld: 146 mg/dL — ABNORMAL HIGH (ref 70–99)
Potassium: 4.1 mmol/L (ref 3.5–5.1)
Sodium: 137 mmol/L (ref 135–145)
Total Bilirubin: 0.7 mg/dL (ref 0.3–1.2)
Total Protein: 6.7 g/dL (ref 6.5–8.1)

## 2021-10-06 LAB — I-STAT CHEM 8, ED
BUN: 19 mg/dL (ref 8–23)
Calcium, Ion: 1.14 mmol/L — ABNORMAL LOW (ref 1.15–1.40)
Chloride: 103 mmol/L (ref 98–111)
Creatinine, Ser: 1.1 mg/dL (ref 0.61–1.24)
Glucose, Bld: 138 mg/dL — ABNORMAL HIGH (ref 70–99)
HCT: 41 % (ref 39.0–52.0)
Hemoglobin: 13.9 g/dL (ref 13.0–17.0)
Potassium: 4 mmol/L (ref 3.5–5.1)
Sodium: 138 mmol/L (ref 135–145)
TCO2: 22 mmol/L (ref 22–32)

## 2021-10-06 LAB — TSH: TSH: 5.809 u[IU]/mL — ABNORMAL HIGH (ref 0.350–4.500)

## 2021-10-06 LAB — CBC WITH DIFFERENTIAL/PLATELET
Abs Immature Granulocytes: 0.03 10*3/uL (ref 0.00–0.07)
Basophils Absolute: 0 10*3/uL (ref 0.0–0.1)
Basophils Relative: 0 %
Eosinophils Absolute: 0 10*3/uL (ref 0.0–0.5)
Eosinophils Relative: 0 %
HCT: 40.9 % (ref 39.0–52.0)
Hemoglobin: 14.1 g/dL (ref 13.0–17.0)
Immature Granulocytes: 0 %
Lymphocytes Relative: 16 %
Lymphs Abs: 1.4 10*3/uL (ref 0.7–4.0)
MCH: 31.9 pg (ref 26.0–34.0)
MCHC: 34.5 g/dL (ref 30.0–36.0)
MCV: 92.5 fL (ref 80.0–100.0)
Monocytes Absolute: 0.6 10*3/uL (ref 0.1–1.0)
Monocytes Relative: 7 %
Neutro Abs: 6.7 10*3/uL (ref 1.7–7.7)
Neutrophils Relative %: 77 %
Platelets: 129 10*3/uL — ABNORMAL LOW (ref 150–400)
RBC: 4.42 MIL/uL (ref 4.22–5.81)
RDW: 12.4 % (ref 11.5–15.5)
WBC: 8.8 10*3/uL (ref 4.0–10.5)
nRBC: 0 % (ref 0.0–0.2)

## 2021-10-06 LAB — URINALYSIS, ROUTINE W REFLEX MICROSCOPIC
Bilirubin Urine: NEGATIVE
Glucose, UA: NEGATIVE mg/dL
Hgb urine dipstick: NEGATIVE
Ketones, ur: NEGATIVE mg/dL
Leukocytes,Ua: NEGATIVE
Nitrite: NEGATIVE
Protein, ur: NEGATIVE mg/dL
Specific Gravity, Urine: 1.01 (ref 1.005–1.030)
pH: 7 (ref 5.0–8.0)

## 2021-10-06 LAB — CK: Total CK: 203 U/L (ref 49–397)

## 2021-10-06 LAB — HEPATIC FUNCTION PANEL
ALT: 23 U/L (ref 0–44)
AST: 31 U/L (ref 15–41)
Albumin: 3.8 g/dL (ref 3.5–5.0)
Alkaline Phosphatase: 63 U/L (ref 38–126)
Bilirubin, Direct: 0.1 mg/dL (ref 0.0–0.2)
Indirect Bilirubin: 0.6 mg/dL (ref 0.3–0.9)
Total Bilirubin: 0.7 mg/dL (ref 0.3–1.2)
Total Protein: 6.5 g/dL (ref 6.5–8.1)

## 2021-10-06 LAB — RESP PANEL BY RT-PCR (FLU A&B, COVID) ARPGX2
Influenza A by PCR: NEGATIVE
Influenza B by PCR: NEGATIVE
SARS Coronavirus 2 by RT PCR: NEGATIVE

## 2021-10-06 LAB — DIFFERENTIAL
Abs Immature Granulocytes: 0.01 10*3/uL (ref 0.00–0.07)
Basophils Absolute: 0 10*3/uL (ref 0.0–0.1)
Basophils Relative: 0 %
Eosinophils Absolute: 0 10*3/uL (ref 0.0–0.5)
Eosinophils Relative: 0 %
Immature Granulocytes: 0 %
Lymphocytes Relative: 21 %
Lymphs Abs: 1.5 10*3/uL (ref 0.7–4.0)
Monocytes Absolute: 0.7 10*3/uL (ref 0.1–1.0)
Monocytes Relative: 10 %
Neutro Abs: 4.8 10*3/uL (ref 1.7–7.7)
Neutrophils Relative %: 69 %

## 2021-10-06 LAB — TYPE AND SCREEN
ABO/RH(D): A POS
Antibody Screen: NEGATIVE

## 2021-10-06 LAB — T4, FREE: Free T4: 0.75 ng/dL (ref 0.61–1.12)

## 2021-10-06 LAB — MAGNESIUM: Magnesium: 1.9 mg/dL (ref 1.7–2.4)

## 2021-10-06 LAB — TROPONIN I (HIGH SENSITIVITY)
Troponin I (High Sensitivity): 21 ng/L — ABNORMAL HIGH (ref <18)
Troponin I (High Sensitivity): 64 ng/L — ABNORMAL HIGH (ref <18)
Troponin I (High Sensitivity): 417 ng/L (ref <18)
Troponin I (High Sensitivity): 631 ng/L (ref <18)

## 2021-10-06 LAB — BRAIN NATRIURETIC PEPTIDE: B Natriuretic Peptide: 25.5 pg/mL (ref 0.0–100.0)

## 2021-10-06 LAB — PHOSPHORUS: Phosphorus: 3.7 mg/dL (ref 2.5–4.6)

## 2021-10-06 LAB — LACTIC ACID, PLASMA
Lactic Acid, Venous: 3.7 mmol/L (ref 0.5–1.9)
Lactic Acid, Venous: 1.8 mmol/L (ref 0.5–1.9)

## 2021-10-06 LAB — PROTIME-INR
INR: 1.2 (ref 0.8–1.2)
Prothrombin Time: 14.8 seconds (ref 11.4–15.2)

## 2021-10-06 MED ORDER — POLYETHYLENE GLYCOL 3350 17 G PO PACK
17.0000 g | PACK | Freq: Every day | ORAL | Status: DC
  Administered 2021-10-06 – 2021-10-08 (×2): 17 g via ORAL
  Filled 2021-10-06 (×2): qty 1

## 2021-10-06 MED ORDER — IOHEXOL 350 MG/ML SOLN
100.0000 mL | Freq: Once | INTRAVENOUS | Status: AC | PRN
  Administered 2021-10-06: 100 mL via INTRAVENOUS

## 2021-10-06 MED ORDER — SODIUM CHLORIDE 0.9 % IV SOLN
75.0000 mL/h | INTRAVENOUS | Status: AC
  Administered 2021-10-06: 75 mL/h via INTRAVENOUS

## 2021-10-06 MED ORDER — HEPARIN (PORCINE) 25000 UT/250ML-% IV SOLN
1300.0000 [IU]/h | INTRAVENOUS | Status: DC
  Administered 2021-10-06: 22:00:00 1050 [IU]/h via INTRAVENOUS
  Filled 2021-10-06: qty 250

## 2021-10-06 MED ORDER — ACETAMINOPHEN 650 MG RE SUPP: 650.0000 mg | Freq: Four times a day (QID) | RECTAL | Status: DC | PRN

## 2021-10-06 MED ORDER — LACTATED RINGERS IV SOLN: INTRAVENOUS | Status: DC

## 2021-10-06 MED ORDER — LACTATED RINGERS IV BOLUS
1000.0000 mL | Freq: Once | INTRAVENOUS | Status: AC
  Administered 2021-10-06: 1000 mL via INTRAVENOUS

## 2021-10-06 MED ORDER — HYDROCODONE-ACETAMINOPHEN 5-325 MG PO TABS: 1.0000 | ORAL_TABLET | ORAL | Status: DC | PRN

## 2021-10-06 MED ORDER — ASPIRIN 81 MG PO CHEW
324.0000 mg | CHEWABLE_TABLET | Freq: Once | ORAL | Status: AC
  Administered 2021-10-06: 324 mg via ORAL
  Filled 2021-10-06: qty 4

## 2021-10-06 MED ORDER — ATORVASTATIN CALCIUM 40 MG PO TABS
40.0000 mg | ORAL_TABLET | Freq: Every day | ORAL | Status: DC
  Administered 2021-10-06 – 2021-10-07 (×2): 40 mg via ORAL
  Filled 2021-10-06 (×2): qty 1

## 2021-10-06 MED ORDER — ACETAMINOPHEN 325 MG PO TABS: 650.0000 mg | ORAL_TABLET | Freq: Four times a day (QID) | ORAL | Status: DC | PRN

## 2021-10-06 MED ORDER — ASPIRIN EC 81 MG PO TBEC
81.0000 mg | DELAYED_RELEASE_TABLET | Freq: Every day | ORAL | Status: DC
  Administered 2021-10-08: 81 mg via ORAL
  Filled 2021-10-06: qty 1

## 2021-10-06 NOTE — H&P (Signed)
Philip [name] PYP:[id number] DOB: 1941-03-23 DOA: 10/06/2021     PCP: Colon Branch, MD   Outpatient Specialists:  CARDS:  Dr. Irish Lack   Patient arrived to ER on 10/06/21 at 1231 Referred by Attending Regan Lemming, MD;[place]*   Patient coming from: home Lives  With family    Chief Complaint:   Chief Complaint  Patient presents with   Fatigue    HPI: Philip [name] is a 80 y.o. 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.4 stable, HLD,     Presented with   presyncope, diaphoresis Pt was feeling well in AM finished his exercise went to locker room and set down but felt very fatigued, he felt  nauseous no vomiting, coach checked his pulse and said it was ok No CP no neurological complaints He goes to the Gym 3 times a a week and does 30 min on Elliptical and does well He went to do light exercise in the gym and started to feel very poorly lightheaded and diaphoretic drenched in sweat, slow to respond, given Iv fluids 500 cc ECG showed multiple PVC's With IV fluids mental status has improved and he was feeling better on arrival to ER    Has  been vaccinated against COVID    Vaccinated against flu    Initial COVID TEST  NEGATIVE   Lab Results  Component Value Date   [place] NEGATIVE 10/06/2021     Regarding pertinent Chronic problems:     Hyperlipidemia -  on statins  Lipitor Lipid Panel     Component Value Date/Time   CHOL 125 04/21/2021 0938   CHOL 137 09/09/2020 0842   TRIG 136.0 04/21/2021 0938   TRIG 256 09/14/2009 0000   HDL 49.30 04/21/2021 0938   HDL 49 09/09/2020 0842   CHOLHDL 3 04/21/2021 0938   VLDL 27.2 04/21/2021 0938   LDLCALC 48 04/21/2021 0938   LDLCALC 66 09/09/2020 0842   LDLDIRECT 68.0 04/13/2015 0823   LABVLDL 22 09/09/2020 0842     HTN on NOrvasc, Lisinopril   chronic CHF diastolic - last echo 2458 (grade 1   diastolic dysfunction)   CAD  - On Aspirin, statin,  betablocker, Plavix                 -  followed by cardiology                - last cardiac cath 2015     Hypothyroidism:  Lab Results  Component Value Date   TSH 5.809 (H) 10/06/2021   Was on synthroid but decided he does not want it any more    obesity-   BMI Readings from Last 1 Encounters:  10/06/21 31.01 kg/m      OSA -on  CPAP,      A. Fib -  - CHA2DS2 vas score 4      current  on anticoagulation with   Eliquis,         While in ER: Noted elevated TSH slightly Trop 21 - 64 Lactic acid up to 3.7 initially but with fluids down to 1.8    ED Triage Vitals  Enc Vitals Group     BP 10/06/21 1240 (!) 155/65     Pulse Rate 10/06/21 1240 63     Resp 10/06/21 1240 16     Temp 10/06/21 1254 (!) 97.4 F (36.3 C)     Temp src --  SpO2 10/06/21 1240 99 %     Weight 10/06/21 1254 210 lb (95.3 kg)     Height --      Head Circumference --      Peak Flow --      Pain Score 10/06/21 1254 0     Pain Loc --      Pain Edu? --      Excl. in [place]? --   TMAX(24)@     _________________________________________ Significant initial  Findings: Abnormal Labs Reviewed  CBC WITH DIFFERENTIAL/PLATELET - Abnormal; Notable for the following components:      Result Value   Platelets 129 (*)    All other components within normal limits  COMPREHENSIVE METABOLIC PANEL - Abnormal; Notable for the following components:   Glucose, Bld 146 (*)    All other components within normal limits  TSH - Abnormal; Notable for the following components:   TSH 5.809 (*)    All other components within normal limits  LACTIC ACID, PLASMA - Abnormal; Notable for the following components:   Lactic Acid, Venous 3.7 (*)    All other components within normal limits  I-STAT CHEM 8, ED - Abnormal; Notable for the following components:   Glucose, Bld 138 (*)    Calcium, Ion 1.14 (*)    All other components within normal limits  TROPONIN I (HIGH SENSITIVITY) - Abnormal; Notable for the following components:    Troponin I (High Sensitivity) 21 (*)    All other components within normal limits  TROPONIN I (HIGH SENSITIVITY) - Abnormal; Notable for the following components:   Troponin I (High Sensitivity) 64 (*)    All other components within normal limits   ____________________________________________ Ordered CT HEAD   NON acute  CXR -  NON acute  CTabd/pelvis - 1. Stable to possibly slightly increased in size, indeterminate, 3.1 x 2.5 cm right renal lesion. Recommend MRI renal protocol for further evaluation as a malignancy is not excluded.  CTA chest -   no PE,   no evidence of infiltrate   _________________________ Troponin 21- 64 ECG: Ordered Personally reviewed by me showing: HR : 68 Rhythm: NSR, prolonged PR  no evidence of ischemic changes QTC 468   The recent clinical data is shown below. Vitals:   10/06/21 1700 10/06/21 1807 10/06/21 1830 10/06/21 1900  BP: (!) 163/81 (!) 158/83 (!) 154/80 (!) 169/90  Pulse: 93 78 77 83  Resp: 18 18 11 11   Temp:      SpO2: 99% 97% 93% 98%  Weight:        WBC     Component Value Date/Time   WBC 8.8 10/06/2021 1250   LYMPHSABS 1.4 10/06/2021 1250   LYMPHSABS 1.4 06/06/2018 0000   MONOABS 0.6 10/06/2021 1250   EOSABS 0.0 10/06/2021 1250   EOSABS 0.1 06/06/2018 0000   BASOSABS 0.0 10/06/2021 1250   BASOSABS 0.0 06/06/2018 0000     Lactic Acid, Venous    Component Value Date/Time   LATICACIDVEN 1.8 10/06/2021 1515      UA  no evidence of UTI      Urine analysis:    Component Value Date/Time   COLORURINE YELLOW 10/06/2021 1523   APPEARANCEUR CLEAR 10/06/2021 1523   LABSPEC 1.010 10/06/2021 1523   PHURINE 7.0 10/06/2021 1523   GLUCOSEU NEGATIVE 10/06/2021 1523   GLUCOSEU NEGATIVE 04/24/2020 0847   HGBUR NEGATIVE 10/06/2021 1523   HGBUR negative 11/10/2008 1322   BILIRUBINUR NEGATIVE 10/06/2021 1523   KETONESUR NEGATIVE 10/06/2021 1523  PROTEINUR NEGATIVE 10/06/2021 1523   UROBILINOGEN 0.2 04/24/2020 0847   NITRITE  NEGATIVE 10/06/2021 1523   LEUKOCYTESUR NEGATIVE 10/06/2021 1523    Results for orders placed or performed in visit on 04/24/20  Urine Culture     Status: None   Collection Time: 04/24/20  8:47 AM   Specimen: Blood  Result Value Ref Range Status   MICRO NUMBER: [id number]  Final   SPECIMEN QUALITY: Adequate  Final   Sample Source NOT GIVEN  Final   STATUS: FINAL  Final   Result: No Growth  Final      _______________________________________________ Hospitalist was called for admission for presyncope with elevated troponin  The following Work up has been ordered so far:  Orders Placed This Encounter  Procedures   Blood culture (routine x 2)   Resp Panel by RT-PCR (Flu A&B, Covid) Nasopharyngeal Swab   DG Chest Port 1 View   CT Angio Chest/Abd/Pel for Dissection W and/or Wo Contrast   CT Head Wo Contrast   CBC WITH DIFFERENTIAL   Comprehensive metabolic panel   Protime-INR   Urinalysis, Routine w reflex microscopic Urine, Clean Catch   Magnesium   Brain natriuretic peptide   TSH   Lactic acid, plasma   T4, free   Cardiac monitoring   Initiate Carrier Fluid Protocol   Cardiac monitoring   Consult to hospitalist   I-Stat Chem 8, ED   EKG 12-Lead   EKG 12-Lead   Type and screen [place] in observation (patient's expected length of stay will be less than 2 midnights)     Following Medications were ordered in ER: Medications  lactated ringers infusion ( Intravenous New Bag/Given 10/06/21 1352)  lactated ringers bolus 1,000 mL (0 mLs Intravenous Stopped 10/06/21 1656)  aspirin chewable tablet 324 mg (324 mg Oral Given 10/06/21 1713)  iohexol (OMNIPAQUE) 350 MG/ML injection 100 mL (100 mLs Intravenous Contrast Given 10/06/21 1742)        Consult Orders  (From admission, onward)           Start     Ordered   10/06/21 1916  Consult to hospitalist  Pg sent by deloris  Once       Provider:  (Not yet assigned)  Question Answer Comment  Place  call to: [facility]   Reason for Consult Admit      10/06/21 1915             OTHER Significant initial  Findings:  labs showing:    Recent Labs  Lab 10/06/21 1250 10/06/21 1308  NA 137 138  K 4.1 4.0  CO2 23  --   GLUCOSE 146* 138*  BUN 18 19  CREATININE 1.08 1.10  CALCIUM 9.6  --   MG 1.9  --     Cr  stable,    Lab Results  Component Value Date   CREATININE 1.10 10/06/2021   CREATININE 1.08 10/06/2021   CREATININE 0.94 08/23/2021    Recent Labs  Lab 10/06/21 1250  AST 29  ALT 25  ALKPHOS 60  BILITOT 0.7  PROT 6.7  ALBUMIN 4.2   Lab Results  Component Value Date   CALCIUM 9.6 10/06/2021    Plt: Lab Results  Component Value Date   PLT 129 (L) 10/06/2021       Recent Labs  Lab 10/06/21 1250 10/06/21 1308  WBC 8.8  --   NEUTROABS 6.7  --   HGB 14.1 13.9  HCT  40.9 41.0  MCV 92.5  --   PLT 129*  --     HG/HCT  stable,      Component Value Date/Time   HGB 13.9 10/06/2021 1308   HGB 14.8 08/14/2019 1030   HCT 41.0 10/06/2021 1308   HCT 42.5 08/14/2019 1030   MCV 92.5 10/06/2021 1250   MCV 94 08/14/2019 1030     Cardiac Panel (last 3 results) No results for input(s): CKTOTAL, CKMB, TROPONINI, RELINDX in the last 72 hours.   BNP (last 3 results) Recent Labs    10/06/21 1250  BNP 25.5      DM  labs:  HbA1C: Recent Labs    04/21/21 0938 08/23/21 0919  HGBA1C 6.0 5.9       CBG (last 3)  No results for input(s): GLUCAP in the last 72 hours.        Cultures: No results found for: SDES, SPECREQUEST, CULT, REPTSTATUS   Radiological Exams on Admission: CT Head Wo Contrast  Result Date: 10/06/2021 CLINICAL DATA:  A male at age 82 presents with mental status changes. EXAM: CT HEAD WITHOUT CONTRAST TECHNIQUE: Contiguous axial images were obtained from the base of the skull through the vertex without intravenous contrast. COMPARISON:  None FINDINGS: Brain: No evidence of acute infarction, hemorrhage, hydrocephalus,  extra-axial collection or mass lesion/mass effect. Mild generalized atrophy and chronic microvascular ischemic changes. Vascular: No hyperdense vessel or unexpected calcification. Skull: Normal. Negative for fracture or focal lesion. Sinuses/Orbits: No acute finding. Other: None. IMPRESSION: No acute intracranial pathology. Mild atrophy and chronic microvascular ischemic change. Electronically Signed   By: Zetta Bills M.D.   On: 10/06/2021 17:54   DG Chest Port 1 View  Result Date: 10/06/2021 CLINICAL DATA:  Weakness. EXAM: PORTABLE CHEST 1 VIEW COMPARISON:  July 08, 2014. FINDINGS: Stable cardiomegaly. Status post coronary bypass graft. Both lungs are clear. The visualized skeletal structures are unremarkable. IMPRESSION: No active disease. Electronically Signed   By: Marijo Conception M.D.   On: 10/06/2021 13:13   CT Angio Chest/Abd/Pel for Dissection W and/or Wo Contrast  Result Date: 10/06/2021 CLINICAL DATA:  Aortic aneurysm, known or suspected. Ha, cp, h/o AAA EXAM: CT ANGIOGRAPHY CHEST, ABDOMEN AND PELVIS TECHNIQUE: Non-contrast CT of the chest was initially obtained. Multidetector CT imaging through the chest, abdomen and pelvis was performed using the standard protocol during bolus administration of intravenous contrast. Multiplanar reconstructed images and MIPs were obtained and reviewed to evaluate the vascular anatomy. CONTRAST:  177mL OMNIPAQUE IOHEXOL 350 MG/ML SOLN COMPARISON:  CT CT angiography abdomen pelvis 05/12/2021. FINDINGS: CTA CHEST FINDINGS Cardiovascular: Preferential opacification of the thoracic aorta. No evidence of thoracic aortic aneurysm or dissection. Severe atherosclerotic plaque. Four-vessel coronary calcification status post coronary artery bypass graft. Severe mitral annular calcification. Normal heart size. No pericardial effusion. The main pulmonary artery is normal in caliber. No central or proximal segmental pulmonary emboli. Mediastinum/Nodes: No enlarged  mediastinal, hilar, or axillary lymph nodes. Thyroid gland, trachea, and esophagus demonstrate no significant findings. Lungs/Pleura: No focal consolidation. There is a 0.5 x 0.4 cm right upper lobe pulmonary nodule (8:53). No pulmonary mass. No pleural effusion. No pneumothorax. Musculoskeletal: No chest wall abnormality. No suspicious lytic or blastic osseous lesions. No acute displaced fracture. Multilevel degenerative changes of the spine. Review of the MIP images confirms the above findings. 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 Normal suprarenal abdominal aorta caliber.  Stable infrarenal abdominal aorta caliber measuring up to 6.7 x 6 cm. No endograft leak. No periaortic fat stranding. Celiac: Similar-appearing at least moderate narrowing of the origin of the celiac artery due to atherosclerotic plaque. Otherwise patent without evidence of aneurysm, dissection, vasculitis or significant stenosis. SMA: Patent without evidence of aneurysm, dissection, vasculitis or significant stenosis. Renals: The origins of the renal arteries are at the level of the aortic stent repair. Similar appearing at least moderate stenosis of the origin left renal artery. Otherwise the renal arteries are patent without evidence of aneurysm, dissection, vasculitis, fibromuscular dysplasia or significant stenosis. 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 Inflow: Severe atherosclerotic plaque distally with marked narrowing of the origin of the left femoral artery. 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 Otherwise patent without evidence of aneurysm, dissection, vasculitis or significant stenosis. Veins: No obvious venous abnormality within the limitations of this arterial phase study. Review of the MIP images confirms the above findings.  NON-VASCULAR Hepatobiliary: No focal liver abnormality. No gallstones, gallbladder wall thickening, or pericholecystic fluid. No biliary dilatation. Pancreas: No focal lesion. Normal pancreatic contour. No surrounding inflammatory changes. No main pancreatic ductal dilatation. Spleen: Normal in size without focal abnormality. Adrenals/Urinary Tract: There is a stable 1.4 cm left adrenal gland nodule with a density of 61 Hounsfield units. There is a stable 1.8 cm right adrenal gland nodule with a density of 45 Hounsfield units. Bilateral kidneys enhance symmetrically. Similar-appearing cortical irregularity of the left inferior renal pole likely related to partial nephrectomy. Stable to possibly slightly increased in size 3.1 x 2.5 cm right renal lesion with a density of 40 Hounsfield units. No hydronephrosis. No hydroureter. The urinary bladder is unremarkable. Stomach/Bowel: Stomach is within normal limits. No evidence of bowel wall thickening or dilatation. No pneumatosis. Query second or third portion of the duodenum diverticula (8:179). Stool throughout the colon. Markedly redundant sigmoid colon. Appendix appears normal. Lymphatic: No lymphadenopathy. Reproductive: Enlarged prostate measuring up to 6 cm. Penile prosthesis noted with balloon pump in the left lower abdomen. No associated findings suggest infection. Other: No intraperitoneal free fluid. No intraperitoneal free gas. No organized fluid collection. Musculoskeletal: Diastasis rectus.  No abdominal wall hernia or abnormality. No suspicious lytic or blastic osseous lesions. No acute displaced fracture. Multilevel degenerative changes of the spine. Review of the MIP images confirms the above findings. IMPRESSION: VASUCLAR: 1. Aortic Atherosclerosis (ICD10-I70.0) with stable marked narrowing of the origin of the left femoral artery as well as at least moderate narrowing of the origin of the celiac and left renal arteries. 2. Stable appearing infrarenal  abdominal aorta aneurysm repair with stent graft extending to bilateral common iliac arteries. 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 Aortic aneurysm NOS (ICD10-I71.9). 3. 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 4. Severe mitral annular calcifications. 5. Four-vessel coronary calcifications status post coronary artery bypass graft. 6. No central or proximal segmental pulmonary emboli. NON-VASCULAR 1. Stable to possibly slightly increased in size, indeterminate, 3.1 x 2.5 cm right renal lesion. Recommend MRI renal protocol for further evaluation as a malignancy is not excluded. 2. Stable indeterminate bilateral adrenal nodularities measuring 1.8 cm on the right and 1.4 cm on the left can further be evaluated on MR renal protocol. 3. Stool throughout the colon. 4. Prostatomegaly. 5. A right upper lobe pulmonary micronodule. No follow-up needed if patient is low-risk. Non-contrast chest CT can be considered in 12 months if patient is high-risk. This recommendation  follows the consensus statement: Guidelines for Management of Incidental Pulmonary Nodules Detected on CT Images: From the Fleischner Society 2017; Radiology 2017; 284:228-243. Electronically Signed   By: Iven Finn M.D.   On: 10/06/2021 18:17   _______________________________________________________________________________________________________ Latest   Blood pressure (!) 169/90, pulse 83, temperature (!) 97.4 F (36.3 C), resp. rate 11, weight 95.3 kg, SpO2 98 %.   Review of Systems:    Pertinent positives include:  fatigue,  Constitutional:  No weight loss, night sweats, Fevers, chills, weight loss  HEENT:  No headaches, Difficulty swallowing,Tooth/dental problems,Sore throat,  No sneezing, itching, ear ache, nasal congestion, post nasal drip,  Cardio-vascular:  No chest pain, Orthopnea, PND, anasarca, dizziness, palpitations.no Bilateral lower extremity swelling  GI:  No  heartburn, indigestion, abdominal pain, nausea, vomiting, diarrhea, change in bowel habits, loss of appetite, melena, blood in stool, hematemesis Resp:  no shortness of breath at rest. No dyspnea on exertion, No excess mucus, no productive cough, No non-productive cough, No coughing up of blood.No change in color of mucus.No wheezing. Skin:  no rash or lesions. No jaundice GU:  no dysuria, change in color of urine, no urgency or frequency. No straining to urinate.  No flank pain.  Musculoskeletal:  No joint pain or no joint swelling. No decreased range of motion. No back pain.  Psych:  No change in mood or affect. No depression or anxiety. No memory loss.  Neuro: no localizing neurological complaints, no tingling, no weakness, no double vision, no gait abnormality, no slurred speech, no confusion  All systems reviewed and apart from [place] all are negative _______________________________________________________________________________________________ Past Medical History:   Past Medical History:  Diagnosis Date   AAA (abdominal aortic aneurysm)    Atrial fibrillation (HCC)    CAD (coronary artery disease)    had a MI , s/p CABG   Cataracts, bilateral    immature   CHF (congestive heart failure) (HCC)    Dysrhythmia    paroximal a fib   GERD (gastroesophageal reflux disease)    takes Omeprazole daily   History of colon polyps    History of kidney stones    History of shingles    Hyperlipidemia    Hypertension    takes Amlodipine,Metoprolol,and Lisinopril daily   Joint pain    Joint swelling    Myocardial infarction ([place])    several with last one being 2001(but never knew about any except 2001)   OSA (obstructive sleep apnea)    started CPAP 12/09   Peripheral vascular disease ([place])    Pneumonia    as a child   Skin cancer    several , one of them was melanoma (sees derm routinely)   Tingling    feet occasionally   Tinnitus       Past Surgical History:  Procedure  Laterality Date   ABDOMINAL AORTAGRAM N/A 06/16/2014   Procedure: ABDOMINAL Maxcine Ham;  Surgeon: Angelia Mould, MD;  Location: [facility] CATH LAB;  Service: Cardiovascular;  Laterality: N/A;   ABDOMINAL AORTIC ENDOVASCULAR STENT GRAFT N/A 07/08/2014   Procedure: ABDOMINAL AORTIC ENDOVASCULAR STENT GRAFT/ GORE;  Surgeon: Angelia Mould, MD;  Location: [place];  Service: Vascular;  Laterality: N/A;   CARDIAC CATHETERIZATION  2015   COLONOSCOPY     CORONARY ARTERY BYPASS GRAFT  1999   x 3 vessels   LEFT HEART CATHETERIZATION WITH CORONARY/GRAFT ANGIOGRAM N/A 05/29/2014   Procedure: LEFT HEART CATHETERIZATION WITH [name];  Surgeon: [name], MD;  Location[facility]  CATH LAB;  Service: Cardiovascular;  Laterality: N/A;   PENILE PROSTHESIS IMPLANT  08/2005   RHINOPLASTY  1975    Social History:  Ambulatory   independently       reports that he quit smoking about 43 years ago. His smoking use included cigarettes. He has never used smokeless tobacco. He reports current alcohol use. He reports that he does not use drugs.   Family History:   Family History  Problem Relation Age of Onset   Prostate cancer Father 22   Heart disease Father    Skin cancer Father    Heart attack Father    Cancer Father    Deep vein thrombosis Father    Hyperlipidemia Father    Hypertension Father    Heart disease Mother    Skin cancer Mother    Heart attack Mother    Cancer Mother    Hyperlipidemia Mother    Hypertension Mother    Varicose Veins Mother    Peripheral vascular disease Mother        amputation   Sleep apnea Brother    Hyperlipidemia Brother    Hypertension Brother    Prostate cancer Brother 46   Colon cancer Other        aunt   Skin cancer Brother    Skin cancer Sister    Cancer Sister    Heart disease Sister    Diabetes Neg Hx    Stroke Neg Hx    ______________________________________________________________________________________________ Allergies: No  Known Allergies   Prior to Admission medications   Medication Sig Start Date End Date Taking? Authorizing Provider  amLODipine (NORVASC) 10 MG tablet Take 1 tablet (10 mg total) by mouth daily. At night 09/27/21  Yes [name], MD  Ascorbic Acid (VITAMIN C ER PO) Take 500 mg by mouth daily.   Yes [provider]  atorvastatin (LIPITOR) 40 MG tablet TAKE 1 TABLET (40 MG TOTAL) BY MOUTH AT BEDTIME. 03/02/21  Yes [name], MD  B Complex-Biotin-FA (B-COMPLEX PO) Take 1 tablet by mouth daily.   Yes [provider]  co-enzyme Q-10 30 MG capsule Take 30 mg by mouth daily.   Yes [provider]  ELIQUIS 5 MG TABS tablet TAKE 1 TABLET TWICE DAILY Patient taking differently: Take 5 mg by mouth 2 (two) times daily. 06/21/21  Yes [name], MD  hydrocortisone 2.5 % cream Apply 1 application topically as needed (skin irritation). 11/14/16  Yes [provider]  lisinopril (ZESTRIL) 20 MG tablet Take 1 tablet (20 mg total) by mouth daily. 04/08/21  Yes [name], MD  magnesium oxide (MAG-OX) 400 MG tablet Take 800 mg by mouth daily.   Yes [provider]  Multiple Vitamins-Minerals (MULTIVITAMIN WITH MINERALS) tablet Take 1 tablet by mouth daily.   Yes [provider]  Omega-3 Fatty Acids (OMEGA-3 FISH OIL) 1200 MG CAPS Take 2 1,200 mg capsules daily Patient taking differently: Take 2,400 mg by mouth daily. 07/13/18  Yes End, Harrell Gave, MD  polyethylene glycol (MIRALAX / GLYCOLAX) packet Take 17 g by mouth daily.   Yes [provider]    ___________________________________________________________________________________________________ Physical Exam: Vitals with BMI 10/06/2021 10/06/2021 10/06/2021  Height - - -  Weight - - -  BMI - - -  Systolic 465 681 275  Diastolic 90 80 83  Pulse 83 77 78     1. General:  in No  Acute distress   Chronically ill  -appearing 2. Psychological: Alert  and   Oriented 3. Head/ENT:   Moist Mucous Membranes                          Head Non traumatic, neck supple                           Poor Dentition 4. SKIN: normal    Skin turgor,  Skin clean Dry and intact no rash 5. Heart: Regular rate and rhythm no  Murmur, no Rub or gallop 6. Lungs:  Clear to auscultation bilaterally, no wheezes or crackles   7. Abdomen: Soft,  non-tender, Non distended   obese  bowel sounds present 8. Lower extremities: no clubbing, cyanosis, no  edema 9. Neurologically Grossly intact, moving all 4 extremities equally  10. MSK: Normal range of motion    Chart has been reviewed  ______________________________________________________________________________________________  Assessment/Plan 80 y.o. male with medical history significant of CAD sp CABG, a.fib, OSA, HTN PVD, AAA 6.5x6.4 stable, HLD,   Admitted for presyncope and rising troponin  NSTEMI   Present on Admission:   Elevated trop/NSTEMI -  -no chest pain no EKG changes troponin continues to rise Order echo, transition to heparin Cardiology consulted plan for cardiac cath Will make NPO  Presyncope - improved with IV fluids, in the setting of rising troponin possible angina  equivalent  monitor on tele   Elevated TSH - mild normal T4 will check T3 did not want to take synthroid in the pat   Chronic constipation - will need bowel regimen    Atrial fibrillation (HCC) - rate controlled, changed to heaprin given rising troponin   AAA (abdominal aortic aneurysm) without rupture - stable   CAD (coronary artery disease) - cont statin, not an beta blocker, make sure on aspirin Cardiology consult given rising troponin   AAA (abdominal aortic aneurysm) - stable    Renal mass - will need MRI  renal protocol discussed with pt, can be ordered as an out pt vs in AM   Mixed hyperlipidemia - contineu lipitor check lipid panel   OSA (obstructive sleep apnea) - CPAP  ordered  Other plan as per orders.  DVT  prophylaxis: heparin  Code Status:    Code Status: Prior FULL CODE as per patient   I had personally discussed CODE STATUS with patient and family     Family Communication:   Family  at  Bedside  plan of care was discussed   with   Wife   Disposition Plan:      To home once workup is complete and patient is stable   Following barriers for discharge:                           NSTEMI work up                             Pain controlled with PO medications                               Afebrile, white count improving able to transition to PO antibiotics                             Will need to be able to tolerate PO  Will likely need home health, home O2, set up                           Will need consultants to evaluate patient prior to discharge                       Consults called:   Cardiology Aware  Admission status:  ED Disposition     ED Disposition  [place]: [place] [[id number]]  Level of Care: Telemetry Cardiac [[id number]]  May place patient in observation at [facility] or [place] if equivalent level of care is available:: No  Covid Evaluation: Asymptomatic Screening Protocol (No Symptoms)  Diagnosis: Postural dizziness with presyncope [[id number]]  Admitting Physician: Toy Baker [[id number]]  Attending Physician: Toy Baker [[id number]]           Obs    Level of care     tele  For  24H     medical floor       SDU tele indefinitely please discontinue once patient no longer qualifies COVID-19 Labs    Lab Results  Component Value Date   [place] 10/06/2021     Precautions: admitted as  Covid Negative    PPE: Used by the provider:   N95  eye Goggles,  Gloves     Toa Mia 10/06/2021, 9:32 PM    [facility]     after 2 AM please page floor coverage PA If 7AM-7PM, please contact the day team taking care of the patient using [email or web address]    Patient was evaluated in the context of the global COVID-19 pandemic, which necessitated consideration that the patient might be at risk for infection with the SARS-CoV-2 virus that causes COVID-19. Institutional protocols and algorithms that pertain to the evaluation of patients at risk for COVID-19 are in a state of rapid change based on information released by regulatory bodies including the CDC and federal and state organizations. These policies and algorithms were followed during the patient's care.

## 2021-10-06 NOTE — Consult Note (Signed)
Cardiology Consultation:   Patient ID: Philip [name] MRN: [id number]; DOB: Jan 19, 1941  Admit date: 10/06/2021 Date of Consult: 10/06/2021  PCP:  Colon Branch, MD   [facility] Cardiologist:  Larae Grooms, MD        Patient Profile:   Philip [name] is a 80 y.o. male with a hx of CAD s/p CABG who is being seen 10/06/2021 for the evaluation of Chest pain at the request of Dr. Armandina Gemma.  History of Present Illness:   Philip [name] 80 yr old presents with generalized weakness  and sweating while working out at the gym.   When he was at the gym, he was engaging in strenuous exercise for his age.  He had feelings of generalized weakness and nausea and out of ordinary sweating all of which lasted about 30 minutes.  He went to the locker room and sat on the bench.  EMS was called.  EMS states that he was drenched in sweat upon arrival.  He was initially slow to respond to all questioning.  During transit to the hospital, he was given 500 cc of IV fluid and placed on supplemental oxygen for comfort.  Serial twelve-lead EKGs were obtained.  Initially, patient had multiple PVCs.  This improved during transit.  Patient's mental status is clear and he was able to answer more questions as they arrived at the hospital.   He denies any current nausea, chest discomfort, shortness of breath, or headache.  Per chart review, patient's medical history is notable for CABG 20 yrs ago, AAA s/p endograft followed by Dr. Scot [name], PAF on apixaban, OSA, HTN.    Past Medical History:  Diagnosis Date   AAA (abdominal aortic aneurysm)    Atrial fibrillation (HCC)    CAD (coronary artery disease)    had a MI , s/p CABG   Cataracts, bilateral    immature   CHF (congestive heart failure) (HCC)    Dysrhythmia    paroximal a fib   GERD (gastroesophageal reflux disease)    takes Omeprazole daily   History of colon polyps    History of kidney stones    History of shingles    Hyperlipidemia    Hypertension     takes Amlodipine,Metoprolol,and Lisinopril daily   Joint pain    Joint swelling    Myocardial infarction ([place])    several with last one being 2001(but never knew about any except 2001)   OSA (obstructive sleep apnea)    started CPAP 12/09   Peripheral vascular disease ([place])    Pneumonia    as a child   Skin cancer    several , one of them was melanoma (sees derm routinely)   Tingling    feet occasionally   Tinnitus     Past Surgical History:  Procedure Laterality Date   ABDOMINAL AORTAGRAM N/A 06/16/2014   Procedure: ABDOMINAL Maxcine Ham;  Surgeon: Angelia Mould, MD;  Location: [facility] CATH LAB;  Service: Cardiovascular;  Laterality: N/A;   ABDOMINAL AORTIC ENDOVASCULAR STENT GRAFT N/A 07/08/2014   Procedure: ABDOMINAL AORTIC ENDOVASCULAR STENT GRAFT/ GORE;  Surgeon: Angelia Mould, MD;  Location: [place];  Service: Vascular;  Laterality: N/A;   CARDIAC CATHETERIZATION  2015   COLONOSCOPY     CORONARY ARTERY BYPASS GRAFT  1999   x 3 vessels   LEFT HEART CATHETERIZATION WITH CORONARY/GRAFT ANGIOGRAM N/A 05/29/2014   Procedure: LEFT HEART CATHETERIZATION WITH [name];  Surgeon: [name], MD;  Location: [facility] CATH  LAB;  Service: Cardiovascular;  Laterality: N/A;   PENILE PROSTHESIS IMPLANT  08/2005   RHINOPLASTY  1975     Home Medications:  Prior to Admission medications   Medication Sig Start Date End Date Taking? Authorizing Provider  amLODipine (NORVASC) 10 MG tablet Take 1 tablet (10 mg total) by mouth daily. At night 09/27/21  Yes [name], MD  Ascorbic Acid (VITAMIN C ER PO) Take 500 mg by mouth daily.   Yes [provider]  atorvastatin (LIPITOR) 40 MG tablet TAKE 1 TABLET (40 MG TOTAL) BY MOUTH AT BEDTIME. 03/02/21  Yes [name], MD  B Complex-Biotin-FA (B-COMPLEX PO) Take 1 tablet by mouth daily.   Yes [provider]  co-enzyme Q-10 30 MG capsule Take 30 mg by mouth daily.   Yes [provider]   ELIQUIS 5 MG TABS tablet TAKE 1 TABLET TWICE DAILY Patient taking differently: Take 5 mg by mouth 2 (two) times daily. 06/21/21  Yes [name], MD  hydrocortisone 2.5 % cream Apply 1 application topically as needed (skin irritation). 11/14/16  Yes [provider]  lisinopril (ZESTRIL) 20 MG tablet Take 1 tablet (20 mg total) by mouth daily. 04/08/21  Yes [name], MD  magnesium oxide (MAG-OX) 400 MG tablet Take 800 mg by mouth daily.   Yes [provider]  Multiple Vitamins-Minerals (MULTIVITAMIN WITH MINERALS) tablet Take 1 tablet by mouth daily.   Yes [provider]  Omega-3 Fatty Acids (OMEGA-3 FISH OIL) 1200 MG CAPS Take 2 1,200 mg capsules daily Patient taking differently: Take 2,400 mg by mouth daily. 07/13/18  Yes End, Harrell Gave, MD  polyethylene glycol (MIRALAX / GLYCOLAX) packet Take 17 g by mouth daily.   Yes [provider]    Inpatient Medications: Scheduled Meds:  [START ON 10/07/2021] aspirin EC  81 mg Oral Daily   atorvastatin  40 mg Oral QHS   polyethylene glycol  17 g Oral Daily   Continuous Infusions:  sodium chloride 75 mL/hr (10/06/21 2210)   heparin 1,050 Units/hr (10/06/21 2215)   lactated ringers Stopped (10/06/21 2146)   PRN Meds: acetaminophen **OR** acetaminophen, HYDROcodone-acetaminophen  Allergies:   No Known Allergies  Social History:   Social History   Socioeconomic History   Marital status: Married    Spouse name: Not on file   Number of children: 3   Years of education: 12   Highest education level: Not on file  Occupational History   Occupation: retired, Actor  Tobacco Use   Smoking status: Former    Types: Cigarettes    Quit date: 1979    Years since quitting: 43.9   Smokeless tobacco: Never   Tobacco comments:    quit smoking in 1979  Vaping Use   Vaping Use: Never used  Substance and Sexual Activity   Alcohol use: Yes    Comment: occasionally - once per week    Drug use: No   Sexual activity: Not Currently  Other Topics Concern   Not on file  Social History Narrative   Lives w/ wife in a 2 story home.  Has one son and 2 stepchildren.  Retired for [employer].     Education: high school.   Social Determinants of Health   Financial Resource Strain: Low Risk    Difficulty of Paying Living Expenses: Not very hard  Food Insecurity: No Food Insecurity   Worried About Running Out of Food in the Last Year: Never true   Ran  Out of Food in the Last Year: Never true  Transportation Needs: Not on file  Physical Activity: Sufficiently Active   Days of Exercise per Week: 3 days   Minutes of Exercise per Session: 60 min  Stress: Not on file  Social Connections: Not on file  Intimate Partner Violence: Not on file    Family History:    Family History  Problem Relation Age of Onset   Prostate cancer Father 50   Heart disease Father    Skin cancer Father    Heart attack Father    Cancer Father    Deep vein thrombosis Father    Hyperlipidemia Father    Hypertension Father    Heart disease Mother    Skin cancer Mother    Heart attack Mother    Cancer Mother    Hyperlipidemia Mother    Hypertension Mother    Varicose Veins Mother    Peripheral vascular disease Mother        amputation   Sleep apnea Brother    Hyperlipidemia Brother    Hypertension Brother    Prostate cancer Brother 56   Colon cancer Other        aunt   Skin cancer Brother    Skin cancer Sister    Cancer Sister    Heart disease Sister    Diabetes Neg Hx    Stroke Neg Hx      ROS:  Please see the history of present illness.   All other ROS reviewed and negative.     Physical Exam/Data:   Vitals:   10/06/21 2000 10/06/21 2030 10/06/21 2100 10/06/21 2145  BP: (!) 150/79 (!) 143/79 (!) 147/81 (!) 151/81  Pulse: 75 71 73 73  Resp: _0 Temp:      SpO2: 97% 97% 96% 95%  Weight:        Intake/Output Summary (Last 24 hours) at 10/06/2021 2239 Last data filed at  10/06/2021 1659 Gross per 24 hour  Intake --  Output 650 ml  Net -650 ml   Last 3 Weights 10/06/2021 08/23/2021 06/02/2021  Weight (lbs) 210 lb 220 lb 8 oz 223 lb  Weight (kg) 95.255 kg 100.018 kg 101.152 kg     Body mass index is 31.01 kg/m.  General:  Well nourished, well developed, in no acute distress HEENT: normal Neck: no JVD Vascular: No carotid bruits; Distal pulses 2+ bilaterally Cardiac:  normal S1, S2; RRR; no murmur  Lungs:  clear to auscultation bilaterally, no wheezing, rhonchi or rales  Abd: soft, nontender, no hepatomegaly  Ext: no edema Musculoskeletal:  No deformities, BUE and BLE strength normal and equal Skin: warm and dry  Neuro:  CNs 2-12 intact, no focal abnormalities noted Psych:  Normal affect   EKG:  The EKG was personally reviewed and demonstrates:  NSR with PVC Telemetry:  Telemetry was personally reviewed and demonstrates sinus rhythm  Relevant CV Studies: Echo: Impressions:   - Normal LV systolic function; mild LVH: mild LVE; mild diastolic    dysfunction; severe MAC with trace MR; moderate LAE.  Laboratory Data:  High Sensitivity Troponin:   Recent Labs  Lab 10/06/21 1250 10/06/21 1515 10/06/21 2017  TROPONINIHS 21* 64* 417*     Chemistry Recent Labs  Lab 10/06/21 1250 10/06/21 1308  NA 137 138  K 4.1 4.0  CL 101 103  CO2 23  --   GLUCOSE 146* 138*  BUN 18 19  CREATININE 1.08 1.10  CALCIUM 9.6  --  MG 1.9  --   GFRNONAA >60  --   ANIONGAP 13  --     Recent Labs  Lab 10/06/21 1250 10/06/21 2017  PROT 6.7 6.5  ALBUMIN 4.2 3.8  AST 29 31  ALT 25 23  ALKPHOS 60 63  BILITOT 0.7 0.7   Lipids No results for input(s): CHOL, TRIG, HDL, LABVLDL, LDLCALC, CHOLHDL in the last 168 hours.  Hematology Recent Labs  Lab 10/06/21 1250 10/06/21 1308  WBC 8.8  --   RBC 4.42  --   HGB 14.1 13.9  HCT 40.9 41.0  MCV 92.5  --   MCH 31.9  --   MCHC 34.5  --   RDW 12.4  --   PLT 129*  --    Thyroid  Recent Labs  Lab  10/06/21 1250 10/06/21 1737  TSH 5.809*  --   FREET4  --  0.75    BNP Recent Labs  Lab 10/06/21 1250  BNP 25.5    DDimer No results for input(s): DDIMER in the last 168 hours.   Radiology/Studies:  CT Head Wo Contrast  Result Date: 10/06/2021 CLINICAL DATA:  A male at age 21 presents with mental status changes. EXAM: CT HEAD WITHOUT CONTRAST TECHNIQUE: Contiguous axial images were obtained from the base of the skull through the vertex without intravenous contrast. COMPARISON:  None FINDINGS: Brain: No evidence of acute infarction, hemorrhage, hydrocephalus, extra-axial collection or mass lesion/mass effect. Mild generalized atrophy and chronic microvascular ischemic changes. Vascular: No hyperdense vessel or unexpected calcification. Skull: Normal. Negative for fracture or focal lesion. Sinuses/Orbits: No acute finding. Other: None. IMPRESSION: No acute intracranial pathology. Mild atrophy and chronic microvascular ischemic change. Electronically Signed   By: Zetta Bills M.D.   On: 10/06/2021 17:54   DG Chest Port 1 View  Result Date: 10/06/2021 CLINICAL DATA:  Weakness. EXAM: PORTABLE CHEST 1 VIEW COMPARISON:  July 08, 2014. FINDINGS: Stable cardiomegaly. Status post coronary bypass graft. Both lungs are clear. The visualized skeletal structures are unremarkable. IMPRESSION: No active disease. Electronically Signed   By: Marijo Conception M.D.   On: 10/06/2021 13:13   CT Angio Chest/Abd/Pel for Dissection W and/or Wo Contrast  Result Date: 10/06/2021 CLINICAL DATA:  Aortic aneurysm, known or suspected. Ha, cp, h/o AAA EXAM: CT ANGIOGRAPHY CHEST, ABDOMEN AND PELVIS TECHNIQUE: Non-contrast CT of the chest was initially obtained. Multidetector CT imaging through the chest, abdomen and pelvis was performed using the standard protocol during bolus administration of intravenous contrast. Multiplanar reconstructed images and MIPs were obtained and reviewed to evaluate the vascular anatomy.  CONTRAST:  156m OMNIPAQUE IOHEXOL 350 MG/ML SOLN COMPARISON:  CT CT angiography abdomen pelvis 05/12/2021. FINDINGS: CTA CHEST FINDINGS Cardiovascular: Preferential opacification of the thoracic aorta. No evidence of thoracic aortic aneurysm or dissection. Severe atherosclerotic plaque. Four-vessel coronary calcification status post coronary artery bypass graft. Severe mitral annular calcification. Normal heart size. No pericardial effusion. The main pulmonary artery is normal in caliber. No central or proximal segmental pulmonary emboli. Mediastinum/Nodes: No enlarged mediastinal, hilar, or axillary lymph nodes. Thyroid gland, trachea, and esophagus demonstrate no significant findings. Lungs/Pleura: No focal consolidation. There is a 0.5 x 0.4 cm right upper lobe pulmonary nodule (8:53). No pulmonary mass. No pleural effusion. No pneumothorax. Musculoskeletal: No chest wall abnormality. No suspicious lytic or blastic osseous lesions. No acute displaced fracture. Multilevel degenerative changes of the spine. Review of the MIP images confirms the above findings. CTA ABDOMEN AND PELVIS FINDINGS VASCULAR Aorta: Stable appearing infrarenal  abdominal aorta aneurysm repair with stent graft extending to bilateral common iliac arteries. Normal suprarenal abdominal aorta caliber. Stable infrarenal abdominal aorta caliber measuring up to 6.7 x 6 cm. No endograft leak. No periaortic fat stranding. Celiac: Similar-appearing at least moderate narrowing of the origin of the celiac artery due to atherosclerotic plaque. Otherwise patent without evidence of aneurysm, dissection, vasculitis or significant stenosis. SMA: Patent without evidence of aneurysm, dissection, vasculitis or significant stenosis. Renals: The origins of the renal arteries are at the level of the aortic stent repair. Similar appearing at least moderate stenosis of the origin left renal artery. Otherwise the renal arteries are patent without evidence of  aneurysm, dissection, vasculitis, fibromuscular dysplasia or significant stenosis. 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 Inflow: Severe atherosclerotic plaque distally with marked narrowing of the origin of the left femoral artery. 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 Otherwise patent without evidence of aneurysm, dissection, vasculitis or significant stenosis. Veins: No obvious venous abnormality within the limitations of this arterial phase study. Review of the MIP images confirms the above findings. NON-VASCULAR Hepatobiliary: No focal liver abnormality. No gallstones, gallbladder wall thickening, or pericholecystic fluid. No biliary dilatation. Pancreas: No focal lesion. Normal pancreatic contour. No surrounding inflammatory changes. No main pancreatic ductal dilatation. Spleen: Normal in size without focal abnormality. Adrenals/Urinary Tract: There is a stable 1.4 cm left adrenal gland nodule with a density of 61 Hounsfield units. There is a stable 1.8 cm right adrenal gland nodule with a density of 45 Hounsfield units. Bilateral kidneys enhance symmetrically. Similar-appearing cortical irregularity of the left inferior renal pole likely related to partial nephrectomy. Stable to possibly slightly increased in size 3.1 x 2.5 cm right renal lesion with a density of 40 Hounsfield units. No hydronephrosis. No hydroureter. The urinary bladder is unremarkable. Stomach/Bowel: Stomach is within normal limits. No evidence of bowel wall thickening or dilatation. No pneumatosis. Query second or third portion of the duodenum diverticula (8:179). Stool throughout the colon. Markedly redundant sigmoid colon. Appendix appears normal. Lymphatic: No lymphadenopathy. Reproductive: Enlarged prostate measuring up to 6 cm. Penile prosthesis noted with balloon pump in the left lower abdomen. No associated findings suggest infection. Other: No intraperitoneal  free fluid. No intraperitoneal free gas. No organized fluid collection. Musculoskeletal: Diastasis rectus.  No abdominal wall hernia or abnormality. No suspicious lytic or blastic osseous lesions. No acute displaced fracture. Multilevel degenerative changes of the spine. Review of the MIP images confirms the above findings. IMPRESSION: VASUCLAR: 1. Aortic Atherosclerosis (ICD10-I70.0) with stable marked narrowing of the origin of the left femoral artery as well as at least moderate narrowing of the origin of the celiac and left renal arteries. 2. 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 Aortic aneurysm NOS (ICD10-I71.9). 3. 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 4. Severe mitral annular calcifications. 5. Four-vessel coronary calcifications status post coronary artery bypass graft. 6. No central or proximal segmental pulmonary emboli. NON-VASCULAR 1. Stable to possibly slightly increased in size, indeterminate, 3.1 x 2.5 cm right renal lesion. Recommend MRI renal protocol for further evaluation as a malignancy is not excluded. 2. Stable indeterminate bilateral adrenal nodularities measuring 1.8 cm on the right and 1.4 cm on the left can further be evaluated on MR renal protocol. 3. Stool throughout the colon. 4. Prostatomegaly. 5. A right upper lobe pulmonary micronodule. No follow-up needed if  patient is low-risk. Non-contrast chest CT can be considered in 12 months if patient is high-risk. This recommendation follows the consensus statement: Guidelines for Management of Incidental Pulmonary Nodules Detected on CT Images: From the Fleischner Society 2017; Radiology 2017; 284:228-243. Electronically Signed   By: Iven Finn M.D.   On: 10/06/2021 18:17     Assessment and Plan:   NSTEMI: Patient has anginal equivalent symptoms and mild  elevation of troponin suggesting NSTEMI especially given his underlying cardiac history. I would treat him as NSTEMI and plan for cardiac cath.  Paroxysmal atrial fibrillation: In sinus currently. On apixaban.  3. HTN: Controlled. Continue current meds.   Risk Assessment/Risk Scores:     TIMI Risk Score for Unstable Angina or Non-ST Elevation MI:   The patient's TIMI risk score is  , which indicates a  % risk of all cause mortality, new or recurrent myocardial infarction or need for urgent revascularization in the next 14 days.          For questions or updates, please contact [place] Please consult [email or web address] for contact info under    Signed, Robinette Haines, MD  10/06/2021 10:39 PM

## 2021-10-06 NOTE — ED Notes (Signed)
Patient transported to CT 

## 2021-10-06 NOTE — Progress Notes (Signed)
ANTICOAGULATION CONSULT NOTE - Initial Consult  Pharmacy Consult for heparin Indication: chest pain/ACS No Known Allergies  Patient Measurements: Weight: 95.3 kg (210 lb) Heparin Dosing Weight: 78 kg   Vital Signs: Temp: 97.4 F (36.3 C) (12/07 1254) BP: 169/90 (12/07 1900) Pulse Rate: 83 (12/07 1900)  Labs: Recent Labs    10/06/21 1250 10/06/21 1308 10/06/21 1515 10/06/21 2017  HGB 14.1 13.9  --   --   HCT 40.9 41.0  --   --   PLT 129*  --   --   --   LABPROT 14.8  --   --   --   INR 1.2  --   --   --   CREATININE 1.08 1.10  --   --   CKTOTAL  --   --   --  203  TROPONINIHS 21*  --  64* 417*    Estimated Creatinine Clearance: 60 mL/min (by C-G formula based on SCr of 1.1 mg/dL).   Medical History: Past Medical History:  Diagnosis Date   AAA (abdominal aortic aneurysm)    Atrial fibrillation (HCC)    CAD (coronary artery disease)    had a MI , s/p CABG   Cataracts, bilateral    immature   CHF (congestive heart failure) (HCC)    Dysrhythmia    paroximal a fib   GERD (gastroesophageal reflux disease)    takes Omeprazole daily   History of colon polyps    History of kidney stones    History of shingles    Hyperlipidemia    Hypertension    takes Amlodipine,Metoprolol,and Lisinopril daily   Joint pain    Joint swelling    Myocardial infarction ([place])    several with last one being 2001(but never knew about any except 2001)   OSA (obstructive sleep apnea)    started CPAP 12/09   Peripheral vascular disease ([place])    Pneumonia    as a child   Skin cancer    several , one of them was melanoma (sees derm routinely)   Tingling    feet occasionally   Tinnitus     Medications:  (Not in a hospital admission)   Assessment: 67 YOM who presented with generalized weakness to start IV heparin in the setting of elevated troponin. Of note, patient is on Eliquis PTA for h/o Afib and last dose was this AM.  H/H wnl, Plt low, SCr wnl  Goal of Therapy:  Heparin  level 0.3-0.7 units/ml Monitor platelets by anticoagulation protocol: Yes   Plan:  -Start heparin at 1050 units/hr. No bolus  -F/u 8 hr aPTT and anti-Xa level -Monitor daily aPTT, anti-Xa level, CBC and s/s of bleeding   Albertina Parr, PharmD., BCPS, BCCCP Clinical Pharmacist Please refer to [facility] for unit-specific pharmacist

## 2021-10-06 NOTE — ED Notes (Signed)
Lab made aware of T4 add-on and verified it can be ran.

## 2021-10-06 NOTE — ED Provider Notes (Signed)
[name]   CSN: [id number] Arrival date & time: 10/06/21  1231     History Chief Complaint  Patient presents with   Fatigue    Philip [name] is a 80 y.Philip. male.  HPI Patient presents with generalized weakness and altered mental status.  This was noticed at the gym.  Patient states that he was not feeling well even before the gym.  When he was at the gym, he was engaging in light exercise.  He had feelings of generalized weakness and nausea.  He went to the locker room and sat on the bench.  While he was sitting there, other patrons of the gym noticed that he did not look well.  He was diaphoretic.  EMS was called.  EMS states that he was drenched in sweat upon arrival.  He was initially slow to respond to all questioning.  During transit to the hospital, he was given 500 cc of IV fluid and placed on supplemental oxygen for comfort.  Serial twelve-lead EKGs were obtained.  Initially, patient had multiple PVCs.  This improved during transit.  Patient's mental status also improved and he was able to answer more questions as they arrived at the hospital.  Currently, patient is able to answer questions.  He endorses generalized weakness.  He denies any current nausea, chest discomfort, shortness of breath, or headache.  Per chart review, patient's medical history is notable for CABG, CHF, AAA, atrial fibrillation, OSA, HTN, HLD, and PVD.  Last imaging of AAA was in July.  At that time, aneurysm sac measured 6.5 x 6.4 cm which was stable from prior imaging.  Patient is followed by [facility]    Past Medical History:  Diagnosis Date   AAA (abdominal aortic aneurysm)    Atrial fibrillation (HCC)    CAD (coronary artery disease)    had a MI , s/p CABG   Cataracts, bilateral    immature   CHF (congestive heart failure) (HCC)    Dysrhythmia    paroximal a fib   GERD (gastroesophageal reflux disease)    takes Omeprazole daily    History of colon polyps    History of kidney stones    History of shingles    Hyperlipidemia    Hypertension    takes Amlodipine,Metoprolol,and Lisinopril daily   Joint pain    Joint swelling    Myocardial infarction ([place])    several with last one being 2001(but never knew about any except 2001)   OSA (obstructive sleep apnea)    started CPAP 12/09   Peripheral vascular disease ([place])    Pneumonia    as a child   Skin cancer    several , one of them was melanoma (sees derm routinely)   Tingling    feet occasionally   Tinnitus     Patient Active Problem List   Diagnosis Date Noted   Skin cancer of face 08/05/2020   Depression 04/02/2019   Lower extremity edema 12/22/2017   Coronary artery disease involving native coronary artery of native heart without angina pectoris 12/22/2017   Other specified hypothyroidism 10/02/2017   Osteoarthritis of left hip 06/26/2017   Class 1 obesity with serious comorbidity and body mass index (BMI) of 30.0 to 30.9 in adult 05/08/2017   Hyperglycemia 04/19/2017   Vitamin D deficiency 03/29/2017   Idiopathic peripheral neuropathy 03/13/2017   Erectile dysfunction 02/22/2017   PCP NOTES >>>>>>>>>>>>>>>>>>>>>>>>>>>>>>>>>>>>>>> 12/21/2015   Chronic constipation 12/21/2015  Atrial fibrillation ([place]) 05/07/2014   Encounter for pre-operative cardiovascular clearance 05/07/2014   AAA (abdominal aortic aneurysm) without rupture ([place]) 04/30/2014   Fatigue 11/25/2013   Hypomagnesemia 06/29/2011   Annual physical exam 02/16/2011   Insomnia 02/16/2011   OSA (obstructive sleep apnea)    History of skin cancer 07/24/2008   Coronary atherosclerosis 07/04/2008   Mixed hyperlipidemia 02/08/2007   Essential hypertension 02/08/2007    Past Surgical History:  Procedure Laterality Date   ABDOMINAL AORTAGRAM N/A 06/16/2014   Procedure: ABDOMINAL Maxcine Ham;  Surgeon: Angelia Mould, MD;  Location: [facility] CATH LAB;  Service: Cardiovascular;  Laterality:  N/A;   ABDOMINAL AORTIC ENDOVASCULAR STENT GRAFT N/A 07/08/2014   Procedure: ABDOMINAL AORTIC ENDOVASCULAR STENT GRAFT/ GORE;  Surgeon: Angelia Mould, MD;  Location: [place];  Service: Vascular;  Laterality: N/A;   CARDIAC CATHETERIZATION  2015   COLONOSCOPY     CORONARY ARTERY BYPASS GRAFT  1999   x 3 vessels   LEFT HEART CATHETERIZATION WITH CORONARY/GRAFT ANGIOGRAM N/A 05/29/2014   Procedure: LEFT HEART CATHETERIZATION WITH [name];  Surgeon: [name], MD;  Location: [facility] CATH LAB;  Service: Cardiovascular;  Laterality: N/A;   PENILE PROSTHESIS IMPLANT  08/2005   RHINOPLASTY  1975       Family History  Problem Relation Age of Onset   Prostate cancer Father 85   Heart disease Father    Skin cancer Father    Heart attack Father    Cancer Father    Deep vein thrombosis Father    Hyperlipidemia Father    Hypertension Father    Heart disease Mother    Skin cancer Mother    Heart attack Mother    Cancer Mother    Hyperlipidemia Mother    Hypertension Mother    Varicose Veins Mother    Peripheral vascular disease Mother        amputation   Sleep apnea Brother    Hyperlipidemia Brother    Hypertension Brother    Prostate cancer Brother 19   Colon cancer Other        aunt   Skin cancer Brother    Skin cancer Sister    Cancer Sister    Heart disease Sister    Diabetes Neg Hx    Stroke Neg Hx     Social History   Tobacco Use   Smoking status: Former    Types: Cigarettes    Quit date: 1979    Years since quitting: 43.9   Smokeless tobacco: Never   Tobacco comments:    quit smoking in 1979  Vaping Use   Vaping Use: Never used  Substance Use Topics   Alcohol use: Yes    Comment: occasionally - once per week   Drug use: No    Home Medications Prior to Admission medications   Medication Sig Start Date End Date Taking? Authorizing Provider  amLODipine (NORVASC) 10 MG tablet Take 1 tablet (10 mg total) by mouth daily. At night 09/27/21   Yes [name], MD  Ascorbic Acid (VITAMIN C ER PO) Take 500 mg by mouth daily.   Yes [provider]  atorvastatin (LIPITOR) 40 MG tablet TAKE 1 TABLET (40 MG TOTAL) BY MOUTH AT BEDTIME. 03/02/21  Yes [name], MD  B Complex-Biotin-FA (B-COMPLEX PO) Take 1 tablet by mouth daily.   Yes [provider]  co-enzyme Q-10 30 MG capsule Take 30 mg by mouth daily.   Yes [provider]  ELIQUIS 5 MG TABS tablet TAKE 1 TABLET TWICE DAILY Patient taking differently: Take 5 mg by mouth 2 (two) times daily. 06/21/21  Yes [name], MD  hydrocortisone 2.5 % cream Apply 1 application topically as needed (skin irritation). 11/14/16  Yes [provider]  lisinopril (ZESTRIL) 20 MG tablet Take 1 tablet (20 mg total) by mouth daily. 04/08/21  Yes [name], MD  magnesium oxide (MAG-OX) 400 MG tablet Take 800 mg by mouth daily.   Yes [provider]  Multiple Vitamins-Minerals (MULTIVITAMIN WITH MINERALS) tablet Take 1 tablet by mouth daily.   Yes [provider]  Omega-3 Fatty Acids (OMEGA-3 FISH OIL) 1200 MG CAPS Take 2 1,200 mg capsules daily Patient taking differently: Take 2,400 mg by mouth daily. 07/13/18  Yes End, Harrell Gave, MD  polyethylene glycol (MIRALAX / GLYCOLAX) packet Take 17 g by mouth daily.   Yes [provider]    Allergies    Patient has no known allergies.  Review of Systems   Review of Systems  Constitutional:  Positive for activity change, diaphoresis and fatigue. Negative for chills and fever.  HENT:  Negative for congestion, ear pain, sore throat, trouble swallowing and voice change.   Eyes:  Negative for pain and visual disturbance.  Respiratory:  Negative for cough, chest tightness, shortness of breath and wheezing.   Cardiovascular:  Negative for chest pain, palpitations and leg swelling.  Gastrointestinal:  Positive for nausea. Negative for abdominal pain, diarrhea and vomiting.   Genitourinary:  Negative for dysuria, flank pain and hematuria.  Musculoskeletal:  Negative for arthralgias, back pain, joint swelling, myalgias and neck pain.  Skin:  Positive for pallor. Negative for rash and wound.  Neurological:  Positive for dizziness, weakness (Generalized) and light-headedness. Negative for seizures, syncope, facial asymmetry, speech difficulty, numbness and headaches.  Hematological:  Bruises/bleeds easily (On Eliquis).  All other systems reviewed and are negative.  Physical Exam Updated Vital Signs BP (!) 145/83   Pulse 79   Temp (!) 97.4 F (36.3 C)   Resp 20   Wt 95.3 kg   SpO2 94%   BMI 31.01 kg/m   Physical Exam Vitals and nursing note reviewed.  Constitutional:      General: He is not in acute distress.    Appearance: Normal appearance. He is well-developed and normal weight. He is not toxic-appearing or diaphoretic.  HENT:     Head: Normocephalic and atraumatic.     Right Ear: External ear normal.     Left Ear: External ear normal.     Nose: Nose normal.     Mouth/Throat:     Mouth: Mucous membranes are dry.     Pharynx: Oropharynx is clear.  Eyes:     Conjunctiva/sclera: Conjunctivae normal.  Cardiovascular:     Rate and Rhythm: Normal rate and regular rhythm.     Heart sounds: No murmur heard. Pulmonary:     Effort: Pulmonary effort is normal. No respiratory distress.     Breath sounds: Normal breath sounds.  Abdominal:     Palpations: Abdomen is soft.     Tenderness: There is no abdominal tenderness.  Musculoskeletal:        General: No swelling.     Cervical back: Neck supple.  Skin:    General: Skin is warm and dry.     Capillary Refill: Capillary refill takes less than 2 seconds.  Neurological:     Mental Status: He is alert.  Psychiatric:  Mood and Affect: Mood normal.    ED Results / Procedures / Treatments   Labs (all labs ordered are listed, but only abnormal results are displayed) Labs Reviewed  CBC WITH  DIFFERENTIAL/PLATELET - Abnormal; Notable for the following components:      Result Value   Platelets 129 (*)    All other components within normal limits  COMPREHENSIVE METABOLIC PANEL - Abnormal; Notable for the following components:   Glucose, Bld 146 (*)    All other components within normal limits  TSH - Abnormal; Notable for the following components:   TSH 5.809 (*)    All other components within normal limits  LACTIC ACID, PLASMA - Abnormal; Notable for the following components:   Lactic Acid, Venous 3.7 (*)    All other components within normal limits  I-STAT CHEM 8, ED - Abnormal; Notable for the following components:   Glucose, Bld 138 (*)    Calcium, Ion 1.14 (*)    All other components within normal limits  TROPONIN I (HIGH SENSITIVITY) - Abnormal; Notable for the following components:   Troponin I (High Sensitivity) 21 (*)    All other components within normal limits  CULTURE, BLOOD (ROUTINE X 2)  CULTURE, BLOOD (ROUTINE X 2)  PROTIME-INR  URINALYSIS, ROUTINE W REFLEX MICROSCOPIC  MAGNESIUM  BRAIN NATRIURETIC PEPTIDE  LACTIC ACID, PLASMA  TYPE AND SCREEN  TROPONIN I (HIGH SENSITIVITY)    EKG EKG Interpretation  Date/Time:  Wednesday October 06 2021 13:04:26 EST Ventricular Rate:  68 PR Interval:  250 QRS Duration: 117 QT Interval:  440 QTC Calculation: 468 R Axis:   13 Text Interpretation: Sinus rhythm Prolonged PR interval Probable left atrial enlargement Left ventricular hypertrophy Confirmed by Godfrey Pick [phone]) on 10/06/2021 1:19:41 PM  Radiology DG Chest Port 1 View  Result Date: 10/06/2021 CLINICAL DATA:  Weakness. EXAM: PORTABLE CHEST 1 VIEW COMPARISON:  July 08, 2014. FINDINGS: Stable cardiomegaly. Status post coronary bypass graft. Both lungs are clear. The visualized skeletal structures are unremarkable. IMPRESSION: No active disease. Electronically Signed   By: Marijo Conception M.D.   On: 10/06/2021 13:13    Procedures Procedures    Medications Ordered in ED Medications  lactated ringers infusion ( Intravenous New Bag/Given 10/06/21 1352)  aspirin chewable tablet 324 mg (has no administration in time range)  lactated ringers bolus 1,000 mL (1,000 mLs Intravenous New Bag/Given 10/06/21 1529)    ED Course  I have reviewed the triage vital signs and the nursing notes.  Pertinent labs & imaging results that were available during my care of the patient were reviewed by me and considered in my medical decision making (see chart for details).    MDM Rules/Calculators/A&P                         CRITICAL CARE Performed by: Godfrey Pick   Total critical care time: 45 minutes  Critical care time was exclusive of separately billable procedures and treating other patients.  Critical care was necessary to treat or prevent imminent or life-threatening deterioration.  Critical care was time spent personally by me on the following activities: development of treatment plan with patient and/or surrogate as well as nursing, discussions with consultants, evaluation of patient's response to treatment, examination of patient, obtaining history from patient or surrogate, ordering and performing treatments and interventions, ordering and review of laboratory studies, ordering and review of radiographic studies, pulse oximetry and re-evaluation of patient's condition.   Patient  presents for near syncopal symptoms.  This was in the setting of a workout at the gym today.  EMS reports that he was not feeling well when he got to the gym.  Patient, however, states that he was feeling fine this morning.  He did progress to his workout and started to feel lightheaded, nausea, and generalized weakness.  The symptoms prompted the patient to go sit down in the locker room.  While sitting on the lock room, he appeared unwell to other gym patrons and EMS was called.  EMS did witness him pale and diaphoretic.  Patient improved during transit following IV  fluids and supplemental oxygen.  EMS did obtain serial twelve-lead EKGs during transit.  There were some dynamic changes.  These changes are shown below:    On arrival, patient is alert and oriented.  He still endorses generalized weakness but states that he feels better than he did earlier.  His blood pressure is moderately elevated.  His heart rate is normal.  EKG was obtained in the ED which did not show any ischemia.  PR interval was prolonged the patient's heart rate was normal.  Given his improvement with IV fluids, patient was started on maintenance IVF.  Laboratory work-up was initiated.  He has a history of AAA, s/p stenting.  He does not endorse any chest discomfort or abdominal pain.  No palpable masses appreciated.  Patient to undergo CT angiography of the chest, abdomen and pelvis.  Patient was kept on bedside cardiac monitor.  He had no recurrence or worsening of symptoms while in the ED.  He continued to endorse mild generalized weakness.  Labs showed normal electrolytes.  Patient's lactic acid was elevated at 3.7.  Given the acute onset of the symptoms, I have a suspicion for sepsis.  Patient may have had poor perfusion of cardiac etiology during this episode.  Repeat lactic acid normalized.  Patient's initial opponent was mildly elevated at 21.  At time of signout, delta troponin was pending.  CT imaging was also pending.  Care of patient was signed out to oncoming ED provider.  Final Clinical Impression(s) / ED Diagnoses Final diagnoses:  Near syncope  Generalized weakness  Lactic acidosis    Rx / DC Orders ED Discharge Orders     None        Godfrey Pick, MD 10/06/21 1644

## 2021-10-06 NOTE — ED Triage Notes (Addendum)
Per EMS, Pt, from gym, became very lethargic while performing light resistance training.  Reports some nausea.  Denies pain.  Pt reports he "wasn't feeling well" prior to starting workout, but can't verbalize exactly what was wrong.  Denies v/d.    EMS reports Pt was having a significant number of PVCs en route.   Pt has a significant cardiac Hx.

## 2021-10-06 NOTE — ED Provider Notes (Addendum)
  Physical Exam  BP (!) 151/71   Pulse 72   Temp (!) 97.4 F (36.3 C)   Resp 15   Wt 95.3 kg   SpO2 97%   BMI 31.01 kg/m     ED Course/Procedures     Procedures  MDM  58M, hx of CABG, CAD s/p stents, CHF, presenting with near syncope and lightheadedness. Diaphoretic, presenting with a lactic acidosis, getting CTA imaging for workup. Will need admission.    Reassess the patient following assumption of care.  He did describe an episode following his workout today that does sound suspicious of cardiogenic arrhythmia with profuse diaphoresis and near syncope.  He does have an extensive cardiac history.  Initial troponin climbing on repeat examination to 64.  The patient is currently chest pain-free with low suspicion for ACS.  Continue to trend the patient's troponins.  CTA imaging with the following findings: IMPRESSION:  VASUCLAR:     1. Aortic Atherosclerosis (ICD10-I70.0) with stable marked narrowing  of the origin of the left femoral artery as well as at least  moderate narrowing of the origin of the celiac and left renal  arteries.  2. Stable appearing infrarenal abdominal aorta aneurysm repair with  stent graft extending to bilateral common iliac arteries. 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 Aortic aneurysm NOS (ICD10-I71.9).  3. 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  4. Severe mitral annular calcifications.  5. Four-vessel coronary calcifications status post coronary artery  bypass graft.  6. No central or proximal segmental pulmonary emboli.     NON-VASCULAR     1. Stable to possibly slightly increased in size, indeterminate, 3.1  x 2.5 cm right renal lesion. Recommend MRI renal protocol for  further evaluation as a malignancy is not excluded.  2. Stable indeterminate bilateral adrenal nodularities measuring 1.8  cm on the right and 1.4 cm on the left can further be evaluated on  MR renal protocol.  3.  Stool throughout the colon.  4. Prostatomegaly.  5. A right upper lobe pulmonary micronodule. No follow-up needed if  patient is low-risk. Non-contrast chest CT can be considered in 12  months if patient is high-risk.     CT head without acute intracranial abnormality.  Patient had frequent PVCs on telemetry but no evidence of ventricular tachycardia in the emergency department.  Given the patient's presentation today, recommended admission for observation and continued cardiac telemetry, continuing trending of the patient's troponins.  Patient endorsed understanding and agrees with plan for admission.  Dr. Roel Cluck consulted and accepted the patient in admission.  Update: Patient third troponin greater than 400.  He is status post 325 mg of aspirin.  He continues to remain chest pain-free.  Cardiology consult placed.  He was started on heparin empirically due to concern for possible NSTEMI.   Regan Lemming, MD 10/06/21 2021    Regan Lemming, MD 10/06/21 2147

## 2021-10-07 ENCOUNTER — Observation Stay (HOSPITAL_COMMUNITY): Payer: Medicare HMO

## 2021-10-07 ENCOUNTER — Encounter (HOSPITAL_COMMUNITY)
Admission: EM | Disposition: A | Discharge status: Home / Self Care | Payer: Self-pay | Source: Home / Self Care | Attending: Internal Medicine

## 2021-10-07 DIAGNOSIS — R42 Dizziness and giddiness: Secondary | ICD-10-CM | POA: Diagnosis not present

## 2021-10-07 DIAGNOSIS — E669 Obesity, unspecified: Secondary | ICD-10-CM | POA: Diagnosis present

## 2021-10-07 DIAGNOSIS — Z87891 Personal history of nicotine dependence: Secondary | ICD-10-CM | POA: Diagnosis not present

## 2021-10-07 DIAGNOSIS — E782 Mixed hyperlipidemia: Secondary | ICD-10-CM | POA: Diagnosis present

## 2021-10-07 DIAGNOSIS — Z6831 Body mass index (BMI) 31.0-31.9, adult: Secondary | ICD-10-CM | POA: Diagnosis not present

## 2021-10-07 DIAGNOSIS — I214 Non-ST elevation (NSTEMI) myocardial infarction: Secondary | ICD-10-CM | POA: Diagnosis present

## 2021-10-07 DIAGNOSIS — I77810 Thoracic aortic ectasia: Secondary | ICD-10-CM | POA: Diagnosis present

## 2021-10-07 DIAGNOSIS — Z20822 Contact with and (suspected) exposure to covid-19: Secondary | ICD-10-CM | POA: Diagnosis present

## 2021-10-07 DIAGNOSIS — Z79899 Other long term (current) drug therapy: Secondary | ICD-10-CM | POA: Diagnosis not present

## 2021-10-07 DIAGNOSIS — E872 Acidosis, unspecified: Secondary | ICD-10-CM | POA: Diagnosis present

## 2021-10-07 DIAGNOSIS — I3481 Nonrheumatic mitral (valve) annulus calcification: Secondary | ICD-10-CM | POA: Diagnosis present

## 2021-10-07 DIAGNOSIS — Z7901 Long term (current) use of anticoagulants: Secondary | ICD-10-CM | POA: Diagnosis not present

## 2021-10-07 DIAGNOSIS — R778 Other specified abnormalities of plasma proteins: Secondary | ICD-10-CM | POA: Diagnosis not present

## 2021-10-07 DIAGNOSIS — K5909 Other constipation: Secondary | ICD-10-CM | POA: Diagnosis present

## 2021-10-07 DIAGNOSIS — I5032 Chronic diastolic (congestive) heart failure: Secondary | ICD-10-CM | POA: Diagnosis present

## 2021-10-07 DIAGNOSIS — G4733 Obstructive sleep apnea (adult) (pediatric): Secondary | ICD-10-CM | POA: Diagnosis present

## 2021-10-07 DIAGNOSIS — I700 Atherosclerosis of aorta: Secondary | ICD-10-CM | POA: Diagnosis present

## 2021-10-07 DIAGNOSIS — I2582 Chronic total occlusion of coronary artery: Secondary | ICD-10-CM | POA: Diagnosis present

## 2021-10-07 DIAGNOSIS — Z951 Presence of aortocoronary bypass graft: Secondary | ICD-10-CM | POA: Diagnosis not present

## 2021-10-07 DIAGNOSIS — R001 Bradycardia, unspecified: Secondary | ICD-10-CM | POA: Diagnosis present

## 2021-10-07 DIAGNOSIS — I723 Aneurysm of iliac artery: Secondary | ICD-10-CM | POA: Diagnosis present

## 2021-10-07 DIAGNOSIS — E039 Hypothyroidism, unspecified: Secondary | ICD-10-CM | POA: Diagnosis present

## 2021-10-07 DIAGNOSIS — I480 Paroxysmal atrial fibrillation: Secondary | ICD-10-CM | POA: Diagnosis present

## 2021-10-07 DIAGNOSIS — R55 Syncope and collapse: Secondary | ICD-10-CM | POA: Diagnosis not present

## 2021-10-07 DIAGNOSIS — I110 Hypertensive heart disease with heart failure: Secondary | ICD-10-CM | POA: Diagnosis present

## 2021-10-07 DIAGNOSIS — Z83438 Family history of other disorder of lipoprotein metabolism and other lipidemia: Secondary | ICD-10-CM | POA: Diagnosis not present

## 2021-10-07 DIAGNOSIS — I2510 Atherosclerotic heart disease of native coronary artery without angina pectoris: Secondary | ICD-10-CM | POA: Diagnosis present

## 2021-10-07 DIAGNOSIS — Z8249 Family history of ischemic heart disease and other diseases of the circulatory system: Secondary | ICD-10-CM | POA: Diagnosis not present

## 2021-10-07 HISTORY — PX: LEFT HEART CATH AND CORS/GRAFTS ANGIOGRAPHY: CATH118250

## 2021-10-07 LAB — CBC WITH DIFFERENTIAL/PLATELET
Abs Immature Granulocytes: 0.02 10*3/uL (ref 0.00–0.07)
Basophils Absolute: 0 10*3/uL (ref 0.0–0.1)
Basophils Relative: 0 %
Eosinophils Absolute: 0.1 10*3/uL (ref 0.0–0.5)
Eosinophils Relative: 1 %
HCT: 40.4 % (ref 39.0–52.0)
Hemoglobin: 13.8 g/dL (ref 13.0–17.0)
Immature Granulocytes: 0 %
Lymphocytes Relative: 28 %
Lymphs Abs: 1.6 10*3/uL (ref 0.7–4.0)
MCH: 31.9 pg (ref 26.0–34.0)
MCHC: 34.2 g/dL (ref 30.0–36.0)
MCV: 93.3 fL (ref 80.0–100.0)
Monocytes Absolute: 0.6 10*3/uL (ref 0.1–1.0)
Monocytes Relative: 11 %
Neutro Abs: 3.3 10*3/uL (ref 1.7–7.7)
Neutrophils Relative %: 60 %
Platelets: 126 10*3/uL — ABNORMAL LOW (ref 150–400)
RBC: 4.33 MIL/uL (ref 4.22–5.81)
RDW: 12.6 % (ref 11.5–15.5)
WBC: 5.6 10*3/uL (ref 4.0–10.5)
nRBC: 0 % (ref 0.0–0.2)

## 2021-10-07 LAB — COMPREHENSIVE METABOLIC PANEL
ALT: 22 U/L (ref 0–44)
AST: 31 U/L (ref 15–41)
Albumin: 3.7 g/dL (ref 3.5–5.0)
Alkaline Phosphatase: 58 U/L (ref 38–126)
Anion gap: 7 (ref 5–15)
BUN: 14 mg/dL (ref 8–23)
CO2: 23 mmol/L (ref 22–32)
Calcium: 8.9 mg/dL (ref 8.9–10.3)
Chloride: 104 mmol/L (ref 98–111)
Creatinine, Ser: 0.8 mg/dL (ref 0.61–1.24)
GFR, Estimated: >60 mL/min (ref >60)
Glucose, Bld: 99 mg/dL (ref 70–99)
Potassium: 3.7 mmol/L (ref 3.5–5.1)
Sodium: 134 mmol/L — ABNORMAL LOW (ref 135–145)
Total Bilirubin: 0.6 mg/dL (ref 0.3–1.2)
Total Protein: 6.2 g/dL — ABNORMAL LOW (ref 6.5–8.1)

## 2021-10-07 LAB — LIPID PANEL
Cholesterol: 122 mg/dL (ref 0–200)
HDL: 45 mg/dL (ref >40)
LDL Cholesterol: 65 mg/dL (ref 0–99)
Total CHOL/HDL Ratio: 2.7 RATIO
Triglycerides: 62 mg/dL (ref <150)
VLDL: 12 mg/dL (ref 0–40)

## 2021-10-07 LAB — ECHOCARDIOGRAM COMPLETE
Area-P 1/2: 4.06 cm2
Calc EF: 47.8 %
S' Lateral: 4.5 cm
Single Plane A2C EF: 43.9 %
Single Plane A4C EF: 50.8 %
Weight: 3360 oz

## 2021-10-07 LAB — MAGNESIUM: Magnesium: 2.3 mg/dL (ref 1.7–2.4)

## 2021-10-07 LAB — TROPONIN I (HIGH SENSITIVITY)
Troponin I (High Sensitivity): 908 ng/L (ref <18)
Troponin I (High Sensitivity): 931 ng/L (ref <18)
Troponin I (High Sensitivity): 99 ng/L — ABNORMAL HIGH (ref <18)

## 2021-10-07 LAB — TSH: TSH: 4.045 u[IU]/mL (ref 0.350–4.500)

## 2021-10-07 LAB — HEPARIN LEVEL (UNFRACTIONATED): Heparin Unfractionated: 0.85 IU/mL — ABNORMAL HIGH (ref 0.30–0.70)

## 2021-10-07 LAB — APTT
aPTT: 39 seconds — ABNORMAL HIGH (ref 24–36)
aPTT: 63 seconds — ABNORMAL HIGH (ref 24–36)

## 2021-10-07 LAB — PHOSPHORUS: Phosphorus: 3 mg/dL (ref 2.5–4.6)

## 2021-10-07 SURGERY — LEFT HEART CATH AND CORS/GRAFTS ANGIOGRAPHY
Anesthesia: LOCAL

## 2021-10-07 MED ORDER — LIDOCAINE HCL (PF) 1 % IJ SOLN
INTRAMUSCULAR | Status: AC
  Filled 2021-10-07: qty 30

## 2021-10-07 MED ORDER — HEPARIN (PORCINE) IN NACL 1000-0.9 UT/500ML-% IV SOLN
INTRAVENOUS | Status: AC
  Filled 2021-10-07: qty 1000

## 2021-10-07 MED ORDER — SODIUM CHLORIDE 0.9% FLUSH: 3.0000 mL | INTRAVENOUS | Status: DC | PRN

## 2021-10-07 MED ORDER — SODIUM CHLORIDE 0.9% FLUSH
3.0000 mL | Freq: Two times a day (BID) | INTRAVENOUS | Status: DC
  Administered 2021-10-07 – 2021-10-08 (×2): 3 mL via INTRAVENOUS

## 2021-10-07 MED ORDER — SODIUM CHLORIDE 0.9 % IV SOLN: 250.0000 mL | INTRAVENOUS | Status: DC | PRN

## 2021-10-07 MED ORDER — SODIUM CHLORIDE 0.9 % IV SOLN: INTRAVENOUS | Status: AC

## 2021-10-07 MED ORDER — MIDAZOLAM HCL 2 MG/2ML IJ SOLN
INTRAMUSCULAR | Status: AC
  Filled 2021-10-07: qty 2

## 2021-10-07 MED ORDER — LIDOCAINE HCL (PF) 1 % IJ SOLN
INTRAMUSCULAR | Status: DC | PRN
  Administered 2021-10-07: 5 mL

## 2021-10-07 MED ORDER — HYDRALAZINE HCL 20 MG/ML IJ SOLN: 10.0000 mg | INTRAMUSCULAR | Status: AC | PRN

## 2021-10-07 MED ORDER — HEPARIN SODIUM (PORCINE) 1000 UNIT/ML IJ SOLN
INTRAMUSCULAR | Status: DC | PRN
  Administered 2021-10-07: 4500 [IU] via INTRAVENOUS

## 2021-10-07 MED ORDER — VERAPAMIL HCL 2.5 MG/ML IV SOLN
INTRAVENOUS | Status: AC
  Filled 2021-10-07: qty 2

## 2021-10-07 MED ORDER — HEPARIN SODIUM (PORCINE) 1000 UNIT/ML IJ SOLN
INTRAMUSCULAR | Status: AC
  Filled 2021-10-07: qty 10

## 2021-10-07 MED ORDER — MIDAZOLAM HCL 2 MG/2ML IJ SOLN
INTRAMUSCULAR | Status: DC | PRN
  Administered 2021-10-07: 1 mg via INTRAVENOUS

## 2021-10-07 MED ORDER — ASPIRIN 81 MG PO CHEW
81.0000 mg | CHEWABLE_TABLET | ORAL | Status: AC
  Administered 2021-10-07: 81 mg via ORAL
  Filled 2021-10-07: qty 1

## 2021-10-07 MED ORDER — IOHEXOL 350 MG/ML SOLN
INTRAVENOUS | Status: DC | PRN
  Administered 2021-10-07: 50 mL via INTRA_ARTERIAL

## 2021-10-07 MED ORDER — SODIUM CHLORIDE 0.9 % WEIGHT BASED INFUSION: 1.0000 mL/kg/h | INTRAVENOUS | Status: DC

## 2021-10-07 MED ORDER — SODIUM CHLORIDE 0.9 % WEIGHT BASED INFUSION
3.0000 mL/kg/h | INTRAVENOUS | Status: DC
  Administered 2021-10-07: 3 mL/kg/h via INTRAVENOUS

## 2021-10-07 MED ORDER — FENTANYL CITRATE (PF) 100 MCG/2ML IJ SOLN
INTRAMUSCULAR | Status: AC
  Filled 2021-10-07: qty 2

## 2021-10-07 MED ORDER — FENTANYL CITRATE (PF) 100 MCG/2ML IJ SOLN
INTRAMUSCULAR | Status: DC | PRN
  Administered 2021-10-07: 25 ug via INTRAVENOUS

## 2021-10-07 MED ORDER — VERAPAMIL HCL 2.5 MG/ML IV SOLN
INTRAVENOUS | Status: DC | PRN
  Administered 2021-10-07: 10 mL via INTRA_ARTERIAL

## 2021-10-07 MED ORDER — HEPARIN (PORCINE) IN NACL 1000-0.9 UT/500ML-% IV SOLN
INTRAVENOUS | Status: DC | PRN
  Administered 2021-10-07 (×2): 500 mL

## 2021-10-07 MED ORDER — LABETALOL HCL 5 MG/ML IV SOLN: 10.0000 mg | INTRAVENOUS | Status: AC | PRN

## 2021-10-07 MED ORDER — SODIUM CHLORIDE 0.9% FLUSH: 3.0000 mL | Freq: Two times a day (BID) | INTRAVENOUS | Status: DC

## 2021-10-07 SURGICAL SUPPLY — 10 items
CATH INFINITI 5 FR IM (CATHETERS) ×1 IMPLANT
CATH INFINITI 5FR MULTPACK ANG (CATHETERS) ×1 IMPLANT
DEVICE RAD COMP TR BAND LRG (VASCULAR PRODUCTS) ×1 IMPLANT
GLIDESHEATH SLEND SS 6F .021 (SHEATH) ×1 IMPLANT
GUIDEWIRE INQWIRE 1.5J.035X260 (WIRE) IMPLANT
INQWIRE 1.5J .035X260CM (WIRE) ×2
KIT HEART LEFT (KITS) ×2 IMPLANT
PACK CARDIAC CATHETERIZATION (CUSTOM PROCEDURE TRAY) ×2 IMPLANT
TRANSDUCER W/STOPCOCK (MISCELLANEOUS) ×2 IMPLANT
TUBING CIL FLEX 10 FLL-RA (TUBING) ×2 IMPLANT

## 2021-10-07 NOTE — ED Notes (Signed)
Paged Dr. Hal Hope about pt's rising troponin.

## 2021-10-07 NOTE — Progress Notes (Signed)
Pt declined CPAP this hospital stay.

## 2021-10-07 NOTE — Progress Notes (Signed)
Also spoke with Dr. Angelena Form in person as well and advised heart rate has been in the 40s at time, but is asymptomatic.

## 2021-10-07 NOTE — Progress Notes (Signed)
ANTICOAGULATION CONSULT NOTE   Pharmacy Consult for heparin Indication: chest pain/ACS No Known Allergies  Patient Measurements: Weight: 95.3 kg (210 lb) Heparin Dosing Weight: 78 kg   Vital Signs: Temp: 97.7 F (36.5 C) (12/08 1225) Temp Source: Oral (12/08 1225) BP: 164/71 (12/08 1225) Pulse Rate: 77 (12/08 1225)  Labs: Recent Labs    10/06/21 1250 10/06/21 1308 10/06/21 1515 10/06/21 2017 10/06/21 2141 10/06/21 2352 10/07/21 0120 10/07/21 0407 10/07/21 1249  HGB 14.1 13.9  --   --   --   --   --  13.8  --   HCT 40.9 41.0  --   --   --   --   --  40.4  --   PLT 129*  --   --   --   --   --   --  126*  --   APTT  --   --   --   --   --   --   --  39* 63*  LABPROT 14.8  --   --   --   --   --   --   --   --   INR 1.2  --   --   --   --   --   --   --   --   HEPARINUNFRC  --   --   --   --   --   --   --  0.85*  --   CREATININE 1.08 1.10  --   --   --   --   --  0.80  --   CKTOTAL  --   --   --  203  --   --   --   --   --   TROPONINIHS 21*  --    < > 417*   < > 99* 908* 931*  --    < > = values in this interval not displayed.     Estimated Creatinine Clearance: 82.5 mL/min (by C-G formula based on SCr of 0.8 mg/dL).   Medical History: Past Medical History:  Diagnosis Date   AAA (abdominal aortic aneurysm)    Atrial fibrillation (HCC)    CAD (coronary artery disease)    had a MI , s/p CABG   Cataracts, bilateral    immature   CHF (congestive heart failure) (HCC)    Dysrhythmia    paroximal a fib   GERD (gastroesophageal reflux disease)    takes Omeprazole daily   History of colon polyps    History of kidney stones    History of shingles    Hyperlipidemia    Hypertension    takes Amlodipine,Metoprolol,and Lisinopril daily   Joint pain    Joint swelling    Myocardial infarction ([place])    several with last one being 2001(but never knew about any except 2001)   OSA (obstructive sleep apnea)    started CPAP 12/09   Peripheral vascular disease ([place])     Pneumonia    as a child   Skin cancer    several , one of them was melanoma (sees derm routinely)   Tingling    feet occasionally   Tinnitus     Medications:  Medications Prior to Admission  Medication Sig Dispense Refill Last Dose   amLODipine (NORVASC) 10 MG tablet Take 1 tablet (10 mg total) by mouth daily. At night 90 tablet 1 10/05/2021   Ascorbic Acid (VITAMIN C ER PO) Take 500  mg by mouth daily.   10/06/2021   atorvastatin (LIPITOR) 40 MG tablet TAKE 1 TABLET (40 MG TOTAL) BY MOUTH AT BEDTIME. 90 tablet 3 10/05/2021   B Complex-Biotin-FA (B-COMPLEX PO) Take 1 tablet by mouth daily.   10/06/2021   co-enzyme Q-10 30 MG capsule Take 30 mg by mouth daily.   10/06/2021   ELIQUIS 5 MG TABS tablet TAKE 1 TABLET TWICE DAILY (Patient taking differently: Take 5 mg by mouth 2 (two) times daily.) 180 tablet 1 10/05/2021 at 2100   hydrocortisone 2.5 % cream Apply 1 application topically as needed (skin irritation).   unk   lisinopril (ZESTRIL) 20 MG tablet Take 1 tablet (20 mg total) by mouth daily. 90 tablet 3 10/06/2021   magnesium oxide (MAG-OX) 400 MG tablet Take 800 mg by mouth daily.   10/05/2021   Multiple Vitamins-Minerals (MULTIVITAMIN WITH MINERALS) tablet Take 1 tablet by mouth daily.   10/06/2021   Omega-3 Fatty Acids (OMEGA-3 FISH OIL) 1200 MG CAPS Take 2 1,200 mg capsules daily (Patient taking differently: Take 2,400 mg by mouth daily.) 90 capsule 3 10/05/2021   polyethylene glycol (MIRALAX / GLYCOLAX) packet Take 17 g by mouth daily.   10/06/2021    Assessment: 19 YOM who presented with generalized weakness to start IV heparin in the setting of elevated troponin. Of note, patient is on Eliquis PTA for h/o Afib and last dose was on 12/7 in the morning. Plans for cath today -aPTT slightly below goal   Goal of Therapy:  Heparin level 0.3-0.7 units/ml Monitor platelets by anticoagulation protocol: Yes   Plan:  -No changes in heparin rate at this time since he is for cath today -Will  follow plans after cath  Hildred Laser, PharmD Clinical Pharmacist **Pharmacist phone directory can now be found on [email or web address] ([email or web address]).  Listed under [place].

## 2021-10-07 NOTE — Interval H&P Note (Signed)
History and Physical Interval Note:  10/07/2021 3:55 PM  Philip [name]  has presented today for surgery, with the diagnosis of chest pain.  The various methods of treatment have been discussed with the patient and family. After consideration of risks, benefits and other options for treatment, the patient has consented to  Procedure(s): LEFT HEART CATH AND CORS/GRAFTS ANGIOGRAPHY (N/A) as a surgical intervention.  The patient's history has been reviewed, patient examined, no change in status, stable for surgery.  I have reviewed the patient's chart and labs.  Questions were answered to the patient's satisfaction.    Cath Lab Visit (complete for each Cath Lab visit)  Clinical Evaluation Leading to the Procedure:   ACS: yes  Non-ACS:    Anginal Classification: CCS II  Anti-ischemic medical therapy: Minimal Therapy (1 class of medications)  Non-Invasive Test Results: No non-invasive testing performed  Prior CABG: Previous CABG        Philip [name]

## 2021-10-07 NOTE — Progress Notes (Signed)
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 Scot [name], PAF on apixaban, HTN, HLD, OSA on cpap   Presented with   presyncope, diaphoresis Pt was feeling well in AM finished his exercise went to locker room and set down but felt very fatigued, he felt  nauseous no vomiting, coach checked his pulse and said it was ok  Subjective:  He is on heparin drip, denies chest pain, no sob, no dizziness, he is able to ambulate to the bathroom, no edema, no hypoxia He is npo , awaiting for cardiac cath  RN reports patient has had brief bradycardia in the to 40's without symptom  Assessment & Plan:   Principal Problem:   Postural dizziness with presyncope Active Problems:   Mixed hyperlipidemia   OSA (obstructive sleep apnea)   AAA (abdominal aortic aneurysm) without rupture   Atrial fibrillation (HCC)   Chronic constipation   Elevated TSH   CAD (coronary artery disease)   AAA (abdominal aortic aneurysm)   Renal mass   Elevated troponin   NSTEMI (non-ST elevated myocardial infarction) ([place])   NSTEMI -High-sensitivity troponin increased to 931 -Echo pending -Cardiology consulted who recommended start heparin drip, n.p.o. for possible cath today -Continue aspirin, Lipitor  HTN: Home regimen Norvasc, lisinopril  PAF In sinus rhythm, not on nodal blocking agent in the past , he did have brief bradycardia on tele On Eliquis Cardiology following    Nutritional Assessment:  The patient's BMI is: Body mass index is 31.01 kg/m.Marland Kitchen  Seen by dietician.  I agree with the assessment and plan as outlined below:  Nutrition Status:          Unresulted Labs (From admission, onward)     Start     Ordered   10/08/21 0500  APTT  Daily,   R      10/06/21 2148   10/08/21 0500  Heparin level  (unfractionated)  Daily,   R      10/06/21 2148   10/07/21 1300  APTT  Once-Timed,   TIMED        10/07/21 0520   10/06/21 2009  T3  Add-on,   AD        10/06/21 2008   10/06/21 1447  Blood culture (routine x 2)  BLOOD CULTURE X 2,   STAT      10/06/21 1446              DVT prophylaxis:   On heparin drip   Code Status:full Family Communication: patient  Disposition:   Status is: inpatient    Dispo: The patient is from: home              Anticipated d/c is to: home              Anticipated d/c date is: TBD                Consultants:  cards  Procedures:  cath  Antimicrobials:   Anti-infectives (From admission, onward)    None           Objective: Vitals:   10/07/21 0645 10/07/21 0700 10/07/21 0715 10/07/21 0730  BP: 139/75 137/75 (!) 141/83 (!) 150/78  Pulse: 82 73 75 77  Resp: 18 12 13 15   Temp:  SpO2: 97% 97% 98% 96%  Weight:        Intake/Output Summary (Last 24 hours) at 10/07/2021 0814 Last data filed at 10/07/2021 0432 Gross per 24 hour  Intake --  Output 1400 ml  Net -1400 ml   Filed Weights   10/06/21 1254  Weight: 95.3 kg    Examination:  General exam: alert, awake, communicative,calm, NAD Respiratory system: Clear to auscultation. Respiratory effort normal. Cardiovascular system:  RRR.  Gastrointestinal system: Abdomen is nondistended, soft and nontender.  Normal bowel sounds  Central nervous system: Alert and oriented. No focal neurological deficits. Extremities:  no edema Skin: No rashes, lesions or ulcers Psychiatry: Judgement and insight appear normal. Mood & affect appropriate.     Data Reviewed: I have personally reviewed following labs and imaging studies  CBC: Recent Labs  Lab 10/06/21 1250 10/06/21 1308 10/06/21 2017 10/07/21 0407  WBC 8.8  --   --  5.6  NEUTROABS 6.7  --  4.8 3.3  HGB 14.1 13.9  --  13.8  HCT 40.9 41.0  --  40.4  MCV 92.5  --   --  93.3  PLT 129*  --   --  126*    Basic  Metabolic Panel: Recent Labs  Lab 10/06/21 1250 10/06/21 1308 10/06/21 2017 10/07/21 0407  NA 137 138  --  134*  K 4.1 4.0  --  3.7  CL 101 103  --  104  CO2 23  --   --  23  GLUCOSE 146* 138*  --  99  BUN 18 19  --  14  CREATININE 1.08 1.10  --  0.80  CALCIUM 9.6  --   --  8.9  MG 1.9  --   --  2.3  PHOS  --   --  3.7 3.0    GFR: Estimated Creatinine Clearance: 82.5 mL/min (by C-G formula based on SCr of 0.8 mg/dL).  Liver Function Tests: Recent Labs  Lab 10/06/21 1250 10/06/21 2017 10/07/21 0407  AST 29 31 31   ALT 25 23 22   ALKPHOS 60 63 58  BILITOT 0.7 0.7 0.6  PROT 6.7 6.5 6.2*  ALBUMIN 4.2 3.8 3.7    CBG: No results for input(s): GLUCAP in the last 168 hours.   Recent Results (from the past 240 hour(s))  Resp Panel by RT-PCR (Flu A&B, Covid) Nasopharyngeal Swab     Status: None   Collection Time: 10/06/21  7:48 PM   Specimen: Nasopharyngeal Swab; Nasopharyngeal(NP) swabs in vial transport medium  Result Value Ref Range Status   SARS Coronavirus 2 by RT PCR NEGATIVE NEGATIVE Final    Comment: (NOTE) SARS-CoV-2 target nucleic acids are NOT DETECTED.  The SARS-CoV-2 RNA is generally detectable in upper respiratory specimens during the acute phase of infection. The lowest concentration of SARS-CoV-2 viral copies this assay can detect is 138 copies/mL. A negative result does not preclude SARS-Cov-2 infection and should not be used as the sole basis for treatment or other patient management decisions. A negative result may occur with  improper specimen collection/handling, submission of specimen other than nasopharyngeal swab, presence of viral mutation(s) within the areas targeted by this assay, and inadequate number of viral copies(<138 copies/mL). A negative result must be combined with clinical observations, patient history, and epidemiological information. The expected result is Negative.  Fact Sheet for Patients:   EntrepreneurPulse.com.au  Fact Sheet for Healthcare Providers:  IncredibleEmployment.be  This test is no t yet approved or cleared by the [place]  FDA and  has been authorized for detection and/or diagnosis of SARS-CoV-2 by FDA under an Emergency Use Authorization (EUA). This EUA will remain  in effect (meaning this test can be used) for the duration of the COVID-19 declaration under Section 564(b)(1) of the Act, 21 U.S.C.section 360bbb-3(b)(1), unless the authorization is terminated  or revoked sooner.       Influenza A by PCR NEGATIVE NEGATIVE Final   Influenza B by PCR NEGATIVE NEGATIVE Final    Comment: (NOTE) The Xpert Xpress SARS-CoV-2/FLU/RSV plus assay is intended as an aid in the diagnosis of influenza from Nasopharyngeal swab specimens and should not be used as a sole basis for treatment. Nasal washings and aspirates are unacceptable for Xpert Xpress SARS-CoV-2/FLU/RSV testing.  Fact Sheet for Patients: EntrepreneurPulse.com.au  Fact Sheet for Healthcare Providers: IncredibleEmployment.be  This test is not yet approved or cleared by the [place] FDA and has been authorized for detection and/or diagnosis of SARS-CoV-2 by FDA under an Emergency Use Authorization (EUA). This EUA will remain in effect (meaning this test can be used) for the duration of the COVID-19 declaration under Section 564(b)(1) of the Act, 21 U.S.C. section 360bbb-3(b)(1), unless the authorization is terminated or revoked.  Performed at [facility], [place] [address], [place], [place] [address]          Radiology Studies: CT Head Wo Contrast  Result Date: 10/06/2021 CLINICAL DATA:  A male at age 11 presents with mental status changes. EXAM: CT HEAD WITHOUT CONTRAST TECHNIQUE: Contiguous axial images were obtained from the base of the skull through the vertex without intravenous contrast. COMPARISON:   None FINDINGS: Brain: No evidence of acute infarction, hemorrhage, hydrocephalus, extra-axial collection or mass lesion/mass effect. Mild generalized atrophy and chronic microvascular ischemic changes. Vascular: No hyperdense vessel or unexpected calcification. Skull: Normal. Negative for fracture or focal lesion. Sinuses/Orbits: No acute finding. Other: None. IMPRESSION: No acute intracranial pathology. Mild atrophy and chronic microvascular ischemic change. Electronically Signed   By: Zetta Bills M.D.   On: 10/06/2021 17:54   DG Chest Port 1 View  Result Date: 10/06/2021 CLINICAL DATA:  Weakness. EXAM: PORTABLE CHEST 1 VIEW COMPARISON:  July 08, 2014. FINDINGS: Stable cardiomegaly. Status post coronary bypass graft. Both lungs are clear. The visualized skeletal structures are unremarkable. IMPRESSION: No active disease. Electronically Signed   By: Marijo Conception M.D.   On: 10/06/2021 13:13   CT Angio Chest/Abd/Pel for Dissection W and/or Wo Contrast  Result Date: 10/06/2021 CLINICAL DATA:  Aortic aneurysm, known or suspected. Ha, cp, h/o AAA EXAM: CT ANGIOGRAPHY CHEST, ABDOMEN AND PELVIS TECHNIQUE: Non-contrast CT of the chest was initially obtained. Multidetector CT imaging through the chest, abdomen and pelvis was performed using the standard protocol during bolus administration of intravenous contrast. Multiplanar reconstructed images and MIPs were obtained and reviewed to evaluate the vascular anatomy. CONTRAST:  164mL OMNIPAQUE IOHEXOL 350 MG/ML SOLN COMPARISON:  CT CT angiography abdomen pelvis 05/12/2021. FINDINGS: CTA CHEST FINDINGS Cardiovascular: Preferential opacification of the thoracic aorta. No evidence of thoracic aortic aneurysm or dissection. Severe atherosclerotic plaque. Four-vessel coronary calcification status post coronary artery bypass graft. Severe mitral annular calcification. Normal heart size. No pericardial effusion. The main pulmonary artery is normal in caliber. No  central or proximal segmental pulmonary emboli. Mediastinum/Nodes: No enlarged mediastinal, hilar, or axillary lymph nodes. Thyroid gland, trachea, and esophagus demonstrate no significant findings. Lungs/Pleura: No focal consolidation. There is a 0.5 x 0.4 cm right upper lobe pulmonary nodule (8:53). No  pulmonary mass. No pleural effusion. No pneumothorax. Musculoskeletal: No chest wall abnormality. No suspicious lytic or blastic osseous lesions. No acute displaced fracture. Multilevel degenerative changes of the spine. Review of the MIP images confirms the above findings. 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 Normal suprarenal abdominal aorta caliber. Stable infrarenal abdominal aorta caliber measuring up to 6.7 x 6 cm. No endograft leak. No periaortic fat stranding. Celiac: Similar-appearing at least moderate narrowing of the origin of the celiac artery due to atherosclerotic plaque. Otherwise patent without evidence of aneurysm, dissection, vasculitis or significant stenosis. SMA: Patent without evidence of aneurysm, dissection, vasculitis or significant stenosis. Renals: The origins of the renal arteries are at the level of the aortic stent repair. Similar appearing at least moderate stenosis of the origin left renal artery. Otherwise the renal arteries are patent without evidence of aneurysm, dissection, vasculitis, fibromuscular dysplasia or significant stenosis. 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 Inflow: Severe atherosclerotic plaque distally with marked narrowing of the origin of the left femoral artery. 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 Otherwise patent without evidence of aneurysm, dissection, vasculitis or significant stenosis. Veins: No obvious venous abnormality within the limitations of this  arterial phase study. Review of the MIP images confirms the above findings. NON-VASCULAR Hepatobiliary: No focal liver abnormality. No gallstones, gallbladder wall thickening, or pericholecystic fluid. No biliary dilatation. Pancreas: No focal lesion. Normal pancreatic contour. No surrounding inflammatory changes. No main pancreatic ductal dilatation. Spleen: Normal in size without focal abnormality. Adrenals/Urinary Tract: There is a stable 1.4 cm left adrenal gland nodule with a density of 61 Hounsfield units. There is a stable 1.8 cm right adrenal gland nodule with a density of 45 Hounsfield units. Bilateral kidneys enhance symmetrically. Similar-appearing cortical irregularity of the left inferior renal pole likely related to partial nephrectomy. Stable to possibly slightly increased in size 3.1 x 2.5 cm right renal lesion with a density of 40 Hounsfield units. No hydronephrosis. No hydroureter. The urinary bladder is unremarkable. Stomach/Bowel: Stomach is within normal limits. No evidence of bowel wall thickening or dilatation. No pneumatosis. Query second or third portion of the duodenum diverticula (8:179). Stool throughout the colon. Markedly redundant sigmoid colon. Appendix appears normal. Lymphatic: No lymphadenopathy. Reproductive: Enlarged prostate measuring up to 6 cm. Penile prosthesis noted with balloon pump in the left lower abdomen. No associated findings suggest infection. Other: No intraperitoneal free fluid. No intraperitoneal free gas. No organized fluid collection. Musculoskeletal: Diastasis rectus.  No abdominal wall hernia or abnormality. No suspicious lytic or blastic osseous lesions. No acute displaced fracture. Multilevel degenerative changes of the spine. Review of the MIP images confirms the above findings. IMPRESSION: VASUCLAR: 1. Aortic Atherosclerosis (ICD10-I70.0) with stable marked narrowing of the origin of the left femoral artery as well as at least moderate narrowing of the  origin of the celiac and left renal arteries. 2. 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 Aortic aneurysm NOS (ICD10-I71.9). 3. 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 4. Severe mitral annular calcifications. 5. Four-vessel coronary calcifications status post coronary artery bypass graft. 6. No central or proximal segmental pulmonary emboli. NON-VASCULAR 1. Stable to possibly slightly increased in size, indeterminate, 3.1 x 2.5 cm right renal lesion. Recommend MRI renal protocol for further evaluation as a  malignancy is not excluded. 2. Stable indeterminate bilateral adrenal nodularities measuring 1.8 cm on the right and 1.4 cm on the left can further be evaluated on MR renal protocol. 3. Stool throughout the colon. 4. Prostatomegaly. 5. A right upper lobe pulmonary micronodule. No follow-up needed if patient is low-risk. Non-contrast chest CT can be considered in 12 months if patient is high-risk. This recommendation follows the consensus statement: Guidelines for Management of Incidental Pulmonary Nodules Detected on CT Images: From the Fleischner Society 2017; Radiology 2017; 284:228-243. Electronically Signed   By: Iven Finn M.D.   On: 10/06/2021 18:17        Scheduled Meds:  aspirin EC  81 mg Oral Daily   atorvastatin  40 mg Oral QHS   polyethylene glycol  17 g Oral Daily   Continuous Infusions:  heparin 1,300 Units/hr (10/07/21 0528)   lactated ringers Stopped (10/06/21 2146)     LOS: 0 days   Time spent: 79mins Greater than 50% of this time was spent in counseling, explanation of diagnosis, planning of further management, and coordination of care.   Voice Recognition Viviann Spare dictation system was used to create this note, attempts have been made to correct errors. Please contact the author with questions and/or  clarifications.   Florencia Reasons, MD PhD FACP [facility]  Available via Epic secure chat 7am-7pm for nonurgent issues Please page for urgent issues To page the attending provider between 7A-7P or the covering provider during after hours 7P-7A, please log into the web site [email or web address] and access using universal [place] password for that web site. If you do not have the password, please call the hospital operator.    10/07/2021, 8:14 AM

## 2021-10-07 NOTE — Progress Notes (Signed)
  Echocardiogram 2D Echocardiogram has been performed.  Philip [name] 10/07/2021, 8:54 AM

## 2021-10-07 NOTE — Progress Notes (Signed)
Walked into room. Pt states that he stopped his precath fluid because the pump was beeping. Spoke with cath lab and advised. Also advised cath lab of heart rate dropping down in to the 40's and then coming back out into the 60s and 70s. Pt is asymptomatic.   Also paged cardiology to make them aware.

## 2021-10-07 NOTE — Progress Notes (Addendum)
Progress Note  Patient Name: Philip [name] Date of Encounter: 10/07/2021  Primary Cardiologist: [place]   Currently pain-free, in process of getting an echo  Inpatient Medications    Scheduled Meds:  aspirin EC  81 mg Oral Daily   atorvastatin  40 mg Oral QHS   polyethylene glycol  17 g Oral Daily   Continuous Infusions:  heparin 1,300 Units/hr (10/07/21 0528)   lactated ringers Stopped (10/06/21 2146)   PRN Meds: acetaminophen **OR** acetaminophen, HYDROcodone-acetaminophen   Vital Signs    Vitals:   10/07/21 0645 10/07/21 0700 10/07/21 0715 10/07/21 0730  BP: 139/75 137/75 (!) 141/83 (!) 150/78  Pulse: 82 73 75 77  Resp: 18 12 13 15   Temp:      SpO2: 97% 97% 98% 96%  Weight:        Intake/Output Summary (Last 24 hours) at 10/07/2021 0823 Last data filed at 10/07/2021 0432 Gross per 24 hour  Intake --  Output 1400 ml  Net -1400 ml    I/O since admission: -[place]   10/06/21 1254  Weight: 95.3 kg    Telemetry    Sinus rhythm in the 80s- Personally Reviewed  ECG    ECG (independently read by me): Sinus rhythm at 70 bpm, first-degree AV block with parable 261 ms.  Early transition.  PVCs, left atrial enlargement.  Physical Exam   BP (!) 150/78   Pulse 77   Temp (!) 97.4 F (36.3 C)   Resp 15   Wt 95.3 kg   SpO2 96%   BMI 31.01 kg/m  General: Alert, oriented, no distress.  Skin: normal turgor, no rashes, warm and dry HEENT: Normocephalic, atraumatic. Pupils equal round and reactive to light; sclera anicteric; extraocular muscles intact;  Nose without nasal septal hypertrophy Mouth/Parynx benign;  Neck: No JVD, no carotid bruits; normal carotid upstroke Lungs: clear to ausculatation and percussion; no wheezing or rales Chest wall: without tenderness to palpitation Heart: PMI not displaced, RRR, s1 s2 normal, 1/6 systolic murmur, no diastolic murmur, no rubs, gallops, thrills, or heaves Abdomen: soft, nontender; no  hepatosplenomehaly, BS+; abdominal aorta nontender and not dilated by palpation. Back: no CVA tenderness Pulses 2+ Musculoskeletal: full range of motion, normal strength, no joint deformities Extremities: no clubbing cyanosis or edema, Homan's sign negative  Neurologic: grossly nonfocal; Cranial nerves grossly wnl Psychologic: Normal mood and affect   Labs    Chemistry Recent Labs  Lab 10/06/21 1250 10/06/21 1308 10/06/21 2017 10/07/21 0407  NA 137 138  --  134*  K 4.1 4.0  --  3.7  CL 101 103  --  104  CO2 23  --   --  23  GLUCOSE 146* 138*  --  99  BUN 18 19  --  14  CREATININE 1.08 1.10  --  0.80  CALCIUM 9.6  --   --  8.9  PROT 6.7  --  6.5 6.2*  ALBUMIN 4.2  --  3.8 3.7  AST 29  --  31 31  ALT 25  --  23 22  ALKPHOS 60  --  63 58  BILITOT 0.7  --  0.7 0.6  GFRNONAA >60  --   --  >60  ANIONGAP 13  --   --  7     Hematology Recent Labs  Lab 10/06/21 1250 10/06/21 1308 10/07/21 0407  WBC 8.8  --  5.6  RBC 4.42  --  4.33  HGB 14.1 13.9  13.8  HCT 40.9 41.0 40.4  MCV 92.5  --  93.3  MCH 31.9  --  31.9  MCHC 34.5  --  34.2  RDW 12.4  --  12.6  PLT 129*  --  126*    HS TROPONIN: 417 > 631 > ? 99 > 908 > 931  Cardiac EnzymesNo results for input(s): TROPONINI in the last 168 hours. No results for input(s): TROPIPOC in the last 168 hours.   BNP Recent Labs  Lab 10/06/21 1250  BNP 25.5     DDimer No results for input(s): DDIMER in the last 168 hours.   Lipid Panel     Component Value Date/Time   CHOL 122 10/07/2021 0407   CHOL 137 09/09/2020 0842   TRIG 62 10/07/2021 0407   TRIG 256 09/14/2009 0000   HDL 45 10/07/2021 0407   HDL 49 09/09/2020 0842   CHOLHDL 2.7 10/07/2021 0407   VLDL 12 10/07/2021 0407   LDLCALC 65 10/07/2021 0407   LDLCALC 66 09/09/2020 0842   LDLDIRECT 68.0 04/13/2015 0823     Radiology    CT Head Wo Contrast  Result Date: 10/06/2021 CLINICAL DATA:  A male at age 31 presents with mental status changes. EXAM: CT HEAD  WITHOUT CONTRAST TECHNIQUE: Contiguous axial images were obtained from the base of the skull through the vertex without intravenous contrast. COMPARISON:  None FINDINGS: Brain: No evidence of acute infarction, hemorrhage, hydrocephalus, extra-axial collection or mass lesion/mass effect. Mild generalized atrophy and chronic microvascular ischemic changes. Vascular: No hyperdense vessel or unexpected calcification. Skull: Normal. Negative for fracture or focal lesion. Sinuses/Orbits: No acute finding. Other: None. IMPRESSION: No acute intracranial pathology. Mild atrophy and chronic microvascular ischemic change. Electronically Signed   By: Zetta Bills M.D.   On: 10/06/2021 17:54   DG Chest Port 1 View  Result Date: 10/06/2021 CLINICAL DATA:  Weakness. EXAM: PORTABLE CHEST 1 VIEW COMPARISON:  July 08, 2014. FINDINGS: Stable cardiomegaly. Status post coronary bypass graft. Both lungs are clear. The visualized skeletal structures are unremarkable. IMPRESSION: No active disease. Electronically Signed   By: Marijo Conception M.D.   On: 10/06/2021 13:13   CT Angio Chest/Abd/Pel for Dissection W and/or Wo Contrast  Result Date: 10/06/2021 CLINICAL DATA:  Aortic aneurysm, known or suspected. Ha, cp, h/o AAA EXAM: CT ANGIOGRAPHY CHEST, ABDOMEN AND PELVIS TECHNIQUE: Non-contrast CT of the chest was initially obtained. Multidetector CT imaging through the chest, abdomen and pelvis was performed using the standard protocol during bolus administration of intravenous contrast. Multiplanar reconstructed images and MIPs were obtained and reviewed to evaluate the vascular anatomy. CONTRAST:  185mL OMNIPAQUE IOHEXOL 350 MG/ML SOLN COMPARISON:  CT CT angiography abdomen pelvis 05/12/2021. FINDINGS: CTA CHEST FINDINGS Cardiovascular: Preferential opacification of the thoracic aorta. No evidence of thoracic aortic aneurysm or dissection. Severe atherosclerotic plaque. Four-vessel coronary calcification status post coronary  artery bypass graft. Severe mitral annular calcification. Normal heart size. No pericardial effusion. The main pulmonary artery is normal in caliber. No central or proximal segmental pulmonary emboli. Mediastinum/Nodes: No enlarged mediastinal, hilar, or axillary lymph nodes. Thyroid gland, trachea, and esophagus demonstrate no significant findings. Lungs/Pleura: No focal consolidation. There is a 0.5 x 0.4 cm right upper lobe pulmonary nodule (8:53). No pulmonary mass. No pleural effusion. No pneumothorax. Musculoskeletal: No chest wall abnormality. No suspicious lytic or blastic osseous lesions. No acute displaced fracture. Multilevel degenerative changes of the spine. Review of the MIP images confirms the above findings. 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 Normal suprarenal abdominal aorta caliber. Stable infrarenal abdominal aorta caliber measuring up to 6.7 x 6 cm. No endograft leak. No periaortic fat stranding. Celiac: Similar-appearing at least moderate narrowing of the origin of the celiac artery due to atherosclerotic plaque. Otherwise patent without evidence of aneurysm, dissection, vasculitis or significant stenosis. SMA: Patent without evidence of aneurysm, dissection, vasculitis or significant stenosis. Renals: The origins of the renal arteries are at the level of the aortic stent repair. Similar appearing at least moderate stenosis of the origin left renal artery. Otherwise the renal arteries are patent without evidence of aneurysm, dissection, vasculitis, fibromuscular dysplasia or significant stenosis. 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 Inflow: Severe atherosclerotic plaque distally with marked narrowing of the origin of the left femoral artery. 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 Otherwise  patent without evidence of aneurysm, dissection, vasculitis or significant stenosis. Veins: No obvious venous abnormality within the limitations of this arterial phase study. Review of the MIP images confirms the above findings. NON-VASCULAR Hepatobiliary: No focal liver abnormality. No gallstones, gallbladder wall thickening, or pericholecystic fluid. No biliary dilatation. Pancreas: No focal lesion. Normal pancreatic contour. No surrounding inflammatory changes. No main pancreatic ductal dilatation. Spleen: Normal in size without focal abnormality. Adrenals/Urinary Tract: There is a stable 1.4 cm left adrenal gland nodule with a density of 61 Hounsfield units. There is a stable 1.8 cm right adrenal gland nodule with a density of 45 Hounsfield units. Bilateral kidneys enhance symmetrically. Similar-appearing cortical irregularity of the left inferior renal pole likely related to partial nephrectomy. Stable to possibly slightly increased in size 3.1 x 2.5 cm right renal lesion with a density of 40 Hounsfield units. No hydronephrosis. No hydroureter. The urinary bladder is unremarkable. Stomach/Bowel: Stomach is within normal limits. No evidence of bowel wall thickening or dilatation. No pneumatosis. Query second or third portion of the duodenum diverticula (8:179). Stool throughout the colon. Markedly redundant sigmoid colon. Appendix appears normal. Lymphatic: No lymphadenopathy. Reproductive: Enlarged prostate measuring up to 6 cm. Penile prosthesis noted with balloon pump in the left lower abdomen. No associated findings suggest infection. Other: No intraperitoneal free fluid. No intraperitoneal free gas. No organized fluid collection. Musculoskeletal: Diastasis rectus.  No abdominal wall hernia or abnormality. No suspicious lytic or blastic osseous lesions. No acute displaced fracture. Multilevel degenerative changes of the spine. Review of the MIP images confirms the above findings. IMPRESSION: VASUCLAR: 1.  Aortic Atherosclerosis (ICD10-I70.0) with stable marked narrowing of the origin of the left femoral artery as well as at least moderate narrowing of the origin of the celiac and left renal arteries. 2. 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 Aortic aneurysm NOS (ICD10-I71.9). 3. 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 4. Severe mitral annular calcifications. 5. Four-vessel coronary calcifications status post coronary artery bypass graft. 6. No central or proximal segmental pulmonary emboli. NON-VASCULAR 1. Stable to possibly slightly increased in size, indeterminate, 3.1 x 2.5 cm right renal lesion. Recommend MRI renal protocol for further evaluation as a malignancy is not excluded. 2. Stable indeterminate bilateral adrenal nodularities measuring 1.8 cm on the right and 1.4 cm on the left can further be evaluated on MR renal protocol. 3. Stool throughout the colon. 4. Prostatomegaly. 5. A right upper lobe pulmonary  micronodule. No follow-up needed if patient is low-risk. Non-contrast chest CT can be considered in 12 months if patient is high-risk. This recommendation follows the consensus statement: Guidelines for Management of Incidental Pulmonary Nodules Detected on CT Images: From the Fleischner Society 2017; Radiology 2017; 284:228-243. Electronically Signed   By: Iven Finn M.D.   On: 10/06/2021 18:17    Cardiac Studies   As above; echo being done  Patient Profile     ANTWAINE BOOMHOWER is a 80 y.o. male with a hx of CAD s/p CABG who was seen on 10/06/2021 for the evaluation of Chest pain at the request of Dr. Armandina Gemma.    Assessment & Plan    NSTEMI: Patient denies current chest pain.  However yesterday after working out with a trainer at [employer], he became profoundly diaphoretic, weak, lightheaded.  He was noted to have  multiple PVCs.  He was given 500 cc of intravenous fluid with improvement.  High-sensitivity troponins have increased to 931.  He is currently on intravenous heparin.  Patient is approximately 22 years status post CABG revascularization which was done at [facility].  Cardiac catheterization recommended.  An echo Doppler study is being done in the emergency room I have reviewed the risks, indications, and alternatives to cardiac catheterization, possible angioplasty, and stenting with the patient. Risks include but are not limited to bleeding, infection, vascular injury, stroke, myocardial infection, arrhythmia, kidney injury, radiation-related injury in the case of prolonged fluoroscopy use, emergency cardiac surgery, and death. The patient understands the risks of serious complication is 1-2 in 8882 with diagnostic cardiac cath and 1-2% or less with angioplasty/stenting.   PAF: Patient has been maintaining sinus rhythm.  His last dose of Eliquis was yesterday morning at approximately 7:30 AM.  We will tentatively plan for him to undergo definitive cardiac catheterization this afternoon. PVD: Status post AAA endograft by Dr. Doren Custard Hypertension: Has been on amlodipine 10 mg, metoprolol, and lisinopril 20 mg OSA: On CPAP since 2009 Hyperlipidemia on atorvastatin 40 mg  Signed, Troy Sine, MD, [facility] 10/07/2021, 8:23 AM

## 2021-10-07 NOTE — ED Notes (Signed)
Epic message report given to Magda Kiel, RN of [phone]

## 2021-10-07 NOTE — H&P (View-Only) (Signed)
Progress Note  Patient Name: Philip [name] Date of Encounter: 10/07/2021  Primary Cardiologist: [place]   Currently pain-free, in process of getting an echo  Inpatient Medications    Scheduled Meds:  aspirin EC  81 mg Oral Daily   atorvastatin  40 mg Oral QHS   polyethylene glycol  17 g Oral Daily   Continuous Infusions:  heparin 1,300 Units/hr (10/07/21 0528)   lactated ringers Stopped (10/06/21 2146)   PRN Meds: acetaminophen **OR** acetaminophen, HYDROcodone-acetaminophen   Vital Signs    Vitals:   10/07/21 0645 10/07/21 0700 10/07/21 0715 10/07/21 0730  BP: 139/75 137/75 (!) 141/83 (!) 150/78  Pulse: 82 73 75 77  Resp: 18 12 13 15   Temp:      SpO2: 97% 97% 98% 96%  Weight:        Intake/Output Summary (Last 24 hours) at 10/07/2021 0823 Last data filed at 10/07/2021 0432 Gross per 24 hour  Intake --  Output 1400 ml  Net -1400 ml    I/O since admission: -[place]   10/06/21 1254  Weight: 95.3 kg    Telemetry    Sinus rhythm in the 80s- Personally Reviewed  ECG    ECG (independently read by me): Sinus rhythm at 70 bpm, first-degree AV block with parable 261 ms.  Early transition.  PVCs, left atrial enlargement.  Physical Exam   BP (!) 150/78   Pulse 77   Temp (!) 97.4 F (36.3 C)   Resp 15   Wt 95.3 kg   SpO2 96%   BMI 31.01 kg/m  General: Alert, oriented, no distress.  Skin: normal turgor, no rashes, warm and dry HEENT: Normocephalic, atraumatic. Pupils equal round and reactive to light; sclera anicteric; extraocular muscles intact;  Nose without nasal septal hypertrophy Mouth/Parynx benign;  Neck: No JVD, no carotid bruits; normal carotid upstroke Lungs: clear to ausculatation and percussion; no wheezing or rales Chest wall: without tenderness to palpitation Heart: PMI not displaced, RRR, s1 s2 normal, 1/6 systolic murmur, no diastolic murmur, no rubs, gallops, thrills, or heaves Abdomen: soft, nontender; no  hepatosplenomehaly, BS+; abdominal aorta nontender and not dilated by palpation. Back: no CVA tenderness Pulses 2+ Musculoskeletal: full range of motion, normal strength, no joint deformities Extremities: no clubbing cyanosis or edema, Homan's sign negative  Neurologic: grossly nonfocal; Cranial nerves grossly wnl Psychologic: Normal mood and affect   Labs    Chemistry Recent Labs  Lab 10/06/21 1250 10/06/21 1308 10/06/21 2017 10/07/21 0407  NA 137 138  --  134*  K 4.1 4.0  --  3.7  CL 101 103  --  104  CO2 23  --   --  23  GLUCOSE 146* 138*  --  99  BUN 18 19  --  14  CREATININE 1.08 1.10  --  0.80  CALCIUM 9.6  --   --  8.9  PROT 6.7  --  6.5 6.2*  ALBUMIN 4.2  --  3.8 3.7  AST 29  --  31 31  ALT 25  --  23 22  ALKPHOS 60  --  63 58  BILITOT 0.7  --  0.7 0.6  GFRNONAA >60  --   --  >60  ANIONGAP 13  --   --  7     Hematology Recent Labs  Lab 10/06/21 1250 10/06/21 1308 10/07/21 0407  WBC 8.8  --  5.6  RBC 4.42  --  4.33  HGB 14.1 13.9  13.8  HCT 40.9 41.0 40.4  MCV 92.5  --  93.3  MCH 31.9  --  31.9  MCHC 34.5  --  34.2  RDW 12.4  --  12.6  PLT 129*  --  126*    HS TROPONIN: 417 > 631 > ? 99 > 908 > 931  Cardiac EnzymesNo results for input(s): TROPONINI in the last 168 hours. No results for input(s): TROPIPOC in the last 168 hours.   BNP Recent Labs  Lab 10/06/21 1250  BNP 25.5     DDimer No results for input(s): DDIMER in the last 168 hours.   Lipid Panel     Component Value Date/Time   CHOL 122 10/07/2021 0407   CHOL 137 09/09/2020 0842   TRIG 62 10/07/2021 0407   TRIG 256 09/14/2009 0000   HDL 45 10/07/2021 0407   HDL 49 09/09/2020 0842   CHOLHDL 2.7 10/07/2021 0407   VLDL 12 10/07/2021 0407   LDLCALC 65 10/07/2021 0407   LDLCALC 66 09/09/2020 0842   LDLDIRECT 68.0 04/13/2015 0823     Radiology    CT Head Wo Contrast  Result Date: 10/06/2021 CLINICAL DATA:  A male at age 23 presents with mental status changes. EXAM: CT HEAD  WITHOUT CONTRAST TECHNIQUE: Contiguous axial images were obtained from the base of the skull through the vertex without intravenous contrast. COMPARISON:  None FINDINGS: Brain: No evidence of acute infarction, hemorrhage, hydrocephalus, extra-axial collection or mass lesion/mass effect. Mild generalized atrophy and chronic microvascular ischemic changes. Vascular: No hyperdense vessel or unexpected calcification. Skull: Normal. Negative for fracture or focal lesion. Sinuses/Orbits: No acute finding. Other: None. IMPRESSION: No acute intracranial pathology. Mild atrophy and chronic microvascular ischemic change. Electronically Signed   By: Zetta Bills M.D.   On: 10/06/2021 17:54   DG Chest Port 1 View  Result Date: 10/06/2021 CLINICAL DATA:  Weakness. EXAM: PORTABLE CHEST 1 VIEW COMPARISON:  July 08, 2014. FINDINGS: Stable cardiomegaly. Status post coronary bypass graft. Both lungs are clear. The visualized skeletal structures are unremarkable. IMPRESSION: No active disease. Electronically Signed   By: Marijo Conception M.D.   On: 10/06/2021 13:13   CT Angio Chest/Abd/Pel for Dissection W and/or Wo Contrast  Result Date: 10/06/2021 CLINICAL DATA:  Aortic aneurysm, known or suspected. Ha, cp, h/o AAA EXAM: CT ANGIOGRAPHY CHEST, ABDOMEN AND PELVIS TECHNIQUE: Non-contrast CT of the chest was initially obtained. Multidetector CT imaging through the chest, abdomen and pelvis was performed using the standard protocol during bolus administration of intravenous contrast. Multiplanar reconstructed images and MIPs were obtained and reviewed to evaluate the vascular anatomy. CONTRAST:  110mL OMNIPAQUE IOHEXOL 350 MG/ML SOLN COMPARISON:  CT CT angiography abdomen pelvis 05/12/2021. FINDINGS: CTA CHEST FINDINGS Cardiovascular: Preferential opacification of the thoracic aorta. No evidence of thoracic aortic aneurysm or dissection. Severe atherosclerotic plaque. Four-vessel coronary calcification status post coronary  artery bypass graft. Severe mitral annular calcification. Normal heart size. No pericardial effusion. The main pulmonary artery is normal in caliber. No central or proximal segmental pulmonary emboli. Mediastinum/Nodes: No enlarged mediastinal, hilar, or axillary lymph nodes. Thyroid gland, trachea, and esophagus demonstrate no significant findings. Lungs/Pleura: No focal consolidation. There is a 0.5 x 0.4 cm right upper lobe pulmonary nodule (8:53). No pulmonary mass. No pleural effusion. No pneumothorax. Musculoskeletal: No chest wall abnormality. No suspicious lytic or blastic osseous lesions. No acute displaced fracture. Multilevel degenerative changes of the spine. Review of the MIP images confirms the above findings. 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 Normal suprarenal abdominal aorta caliber. Stable infrarenal abdominal aorta caliber measuring up to 6.7 x 6 cm. No endograft leak. No periaortic fat stranding. Celiac: Similar-appearing at least moderate narrowing of the origin of the celiac artery due to atherosclerotic plaque. Otherwise patent without evidence of aneurysm, dissection, vasculitis or significant stenosis. SMA: Patent without evidence of aneurysm, dissection, vasculitis or significant stenosis. Renals: The origins of the renal arteries are at the level of the aortic stent repair. Similar appearing at least moderate stenosis of the origin left renal artery. Otherwise the renal arteries are patent without evidence of aneurysm, dissection, vasculitis, fibromuscular dysplasia or significant stenosis. 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 Inflow: Severe atherosclerotic plaque distally with marked narrowing of the origin of the left femoral artery. 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 Otherwise  patent without evidence of aneurysm, dissection, vasculitis or significant stenosis. Veins: No obvious venous abnormality within the limitations of this arterial phase study. Review of the MIP images confirms the above findings. NON-VASCULAR Hepatobiliary: No focal liver abnormality. No gallstones, gallbladder wall thickening, or pericholecystic fluid. No biliary dilatation. Pancreas: No focal lesion. Normal pancreatic contour. No surrounding inflammatory changes. No main pancreatic ductal dilatation. Spleen: Normal in size without focal abnormality. Adrenals/Urinary Tract: There is a stable 1.4 cm left adrenal gland nodule with a density of 61 Hounsfield units. There is a stable 1.8 cm right adrenal gland nodule with a density of 45 Hounsfield units. Bilateral kidneys enhance symmetrically. Similar-appearing cortical irregularity of the left inferior renal pole likely related to partial nephrectomy. Stable to possibly slightly increased in size 3.1 x 2.5 cm right renal lesion with a density of 40 Hounsfield units. No hydronephrosis. No hydroureter. The urinary bladder is unremarkable. Stomach/Bowel: Stomach is within normal limits. No evidence of bowel wall thickening or dilatation. No pneumatosis. Query second or third portion of the duodenum diverticula (8:179). Stool throughout the colon. Markedly redundant sigmoid colon. Appendix appears normal. Lymphatic: No lymphadenopathy. Reproductive: Enlarged prostate measuring up to 6 cm. Penile prosthesis noted with balloon pump in the left lower abdomen. No associated findings suggest infection. Other: No intraperitoneal free fluid. No intraperitoneal free gas. No organized fluid collection. Musculoskeletal: Diastasis rectus.  No abdominal wall hernia or abnormality. No suspicious lytic or blastic osseous lesions. No acute displaced fracture. Multilevel degenerative changes of the spine. Review of the MIP images confirms the above findings. IMPRESSION: VASUCLAR: 1.  Aortic Atherosclerosis (ICD10-I70.0) with stable marked narrowing of the origin of the left femoral artery as well as at least moderate narrowing of the origin of the celiac and left renal arteries. 2. 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 Aortic aneurysm NOS (ICD10-I71.9). 3. 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 4. Severe mitral annular calcifications. 5. Four-vessel coronary calcifications status post coronary artery bypass graft. 6. No central or proximal segmental pulmonary emboli. NON-VASCULAR 1. Stable to possibly slightly increased in size, indeterminate, 3.1 x 2.5 cm right renal lesion. Recommend MRI renal protocol for further evaluation as a malignancy is not excluded. 2. Stable indeterminate bilateral adrenal nodularities measuring 1.8 cm on the right and 1.4 cm on the left can further be evaluated on MR renal protocol. 3. Stool throughout the colon. 4. Prostatomegaly. 5. A right upper lobe pulmonary  micronodule. No follow-up needed if patient is low-risk. Non-contrast chest CT can be considered in 12 months if patient is high-risk. This recommendation follows the consensus statement: Guidelines for Management of Incidental Pulmonary Nodules Detected on CT Images: From the Fleischner Society 2017; Radiology 2017; 284:228-243. Electronically Signed   By: Iven Finn M.D.   On: 10/06/2021 18:17    Cardiac Studies   As above; echo being done  Patient Profile     SHUAYB SCHEPERS is a 80 y.o. male with a hx of CAD s/p CABG who was seen on 10/06/2021 for the evaluation of Chest pain at the request of Dr. Armandina Gemma.    Assessment & Plan    NSTEMI: Patient denies current chest pain.  However yesterday after working out with a trainer at [employer], he became profoundly diaphoretic, weak, lightheaded.  He was noted to have  multiple PVCs.  He was given 500 cc of intravenous fluid with improvement.  High-sensitivity troponins have increased to 931.  He is currently on intravenous heparin.  Patient is approximately 21 years status post CABG revascularization which was done at [facility]  Cardiac catheterization recommended.  An echo Doppler study is being done in the emergency room I have reviewed the risks, indications, and alternatives to cardiac catheterization, possible angioplasty, and stenting with the patient. Risks include but are not limited to bleeding, infection, vascular injury, stroke, myocardial infection, arrhythmia, kidney injury, radiation-related injury in the case of prolonged fluoroscopy use, emergency cardiac surgery, and death. The patient understands the risks of serious complication is 1-2 in 9233 with diagnostic cardiac cath and 1-2% or less with angioplasty/stenting.   PAF: Patient has been maintaining sinus rhythm.  His last dose of Eliquis was yesterday morning at approximately 7:30 AM.  We will tentatively plan for him to undergo definitive cardiac catheterization this afternoon. PVD: Status post AAA endograft by Dr. Doren Custard Hypertension: Has been on amlodipine 10 mg, metoprolol, and lisinopril 20 mg OSA: On CPAP since 2009 Hyperlipidemia on atorvastatin 40 mg  Signed, Troy Sine, MD, [facility] 10/07/2021, 8:23 AM

## 2021-10-07 NOTE — Progress Notes (Signed)
ANTICOAGULATION CONSULT NOTE   Pharmacy Consult for heparin Indication: chest pain/ACS  No Known Allergies  Patient Measurements: Weight: 95.3 kg (210 lb) Heparin Dosing Weight: 78 kg   Vital Signs: BP: 133/75 (12/08 0445) Pulse Rate: 73 (12/08 0445)  Labs: Recent Labs    10/06/21 1250 10/06/21 1308 10/06/21 1515 10/06/21 2017 10/06/21 2141 10/06/21 2352 10/07/21 0120 10/07/21 0407  HGB 14.1 13.9  --   --   --   --   --  13.8  HCT 40.9 41.0  --   --   --   --   --  40.4  PLT 129*  --   --   --   --   --   --  126*  APTT  --   --   --   --   --   --   --  39*  LABPROT 14.8  --   --   --   --   --   --   --   INR 1.2  --   --   --   --   --   --   --   HEPARINUNFRC  --   --   --   --   --   --   --  0.85*  CREATININE 1.08 1.10  --   --   --   --   --   --   CKTOTAL  --   --   --  203  --   --   --   --   TROPONINIHS 21*  --    < > 417* 631* 99* 908*  --    < > = values in this interval not displayed.     Estimated Creatinine Clearance: 60 mL/min (by C-G formula based on SCr of 1.1 mg/dL).   Assessment: 51 YOM who presented with generalized weakness to start IV heparin in the setting of elevated troponin. Of note, patient is on Eliquis PTA for h/o Afib and last dose was this AM.  Heparin level 0.85 (affected by Eliquis), aPTT 39 sec (subtherapeutic) on gtt at 1050 units/hr. No issues with line or bleeding reported per RN.  Goal of Therapy:  Heparin level 0.3-0.7 units/ml, aPTT 66-102 sec Monitor platelets by anticoagulation protocol: Yes   Plan:  Increase heparin to 1300 units/hr F/u 8h aPTT  Sherlon Handing, PharmD, BCPS Please see amion for complete clinical pharmacist phone list 10/07/2021 5:09 AM

## 2021-10-08 ENCOUNTER — Encounter (HOSPITAL_COMMUNITY): Payer: Self-pay | Admitting: Cardiovascular Disease

## 2021-10-08 ENCOUNTER — Other Ambulatory Visit: Payer: Self-pay | Admitting: Cardiology

## 2021-10-08 DIAGNOSIS — R42 Dizziness and giddiness: Secondary | ICD-10-CM | POA: Diagnosis not present

## 2021-10-08 DIAGNOSIS — R001 Bradycardia, unspecified: Secondary | ICD-10-CM

## 2021-10-08 DIAGNOSIS — R55 Syncope and collapse: Secondary | ICD-10-CM | POA: Diagnosis not present

## 2021-10-08 LAB — BASIC METABOLIC PANEL
Anion gap: 9 (ref 5–15)
BUN: 15 mg/dL (ref 8–23)
CO2: 24 mmol/L (ref 22–32)
Calcium: 9 mg/dL (ref 8.9–10.3)
Chloride: 104 mmol/L (ref 98–111)
Creatinine, Ser: 0.94 mg/dL (ref 0.61–1.24)
GFR, Estimated: >60 mL/min (ref >60)
Glucose, Bld: 93 mg/dL (ref 70–99)
Potassium: 3.7 mmol/L (ref 3.5–5.1)
Sodium: 137 mmol/L (ref 135–145)

## 2021-10-08 LAB — CBC
HCT: 41.4 % (ref 39.0–52.0)
Hemoglobin: 14.1 g/dL (ref 13.0–17.0)
MCH: 31.5 pg (ref 26.0–34.0)
MCHC: 34.1 g/dL (ref 30.0–36.0)
MCV: 92.6 fL (ref 80.0–100.0)
Platelets: 127 10*3/uL — ABNORMAL LOW (ref 150–400)
RBC: 4.47 MIL/uL (ref 4.22–5.81)
RDW: 12.7 % (ref 11.5–15.5)
WBC: 4.6 10*3/uL (ref 4.0–10.5)
nRBC: 0 % (ref 0.0–0.2)

## 2021-10-08 LAB — T3: T3, Total: 73 ng/dL (ref 71–180)

## 2021-10-08 MED ORDER — APIXABAN 5 MG PO TABS
5.0000 mg | ORAL_TABLET | Freq: Two times a day (BID) | ORAL | Status: DC
  Administered 2021-10-08: 5 mg via ORAL
  Filled 2021-10-08: qty 1

## 2021-10-08 NOTE — Discharge Summary (Signed)
Discharge Summary  Philip [name] OEV:[id number] DOB: 1940/11/23  PCP: Colon Branch, MD  Admit date: 10/06/2021 Discharge date: 10/08/2021  Time spent:  63mins  Recommendations for Outpatient Follow-up:  F/u with PCP within a week  for hospital discharge follow up, pcp to follow up on final blood culture result, so far no growth  F/u with cardiology, cardiology to arrange outpatient cardiac monitoring    Discharge Diagnoses:  Active Hospital Problems   Diagnosis Date Noted   Postural dizziness with presyncope 10/06/2021   Elevated TSH 10/06/2021   CAD (coronary artery disease) 10/06/2021   AAA (abdominal aortic aneurysm) 10/06/2021   Renal mass 10/06/2021   Elevated troponin 10/06/2021   NSTEMI (non-ST elevated myocardial infarction) ([place]) 10/06/2021   Chronic constipation 12/21/2015   Atrial fibrillation ([place]) 05/07/2014   AAA (abdominal aortic aneurysm) without rupture 04/30/2014   OSA (obstructive sleep apnea)    Mixed hyperlipidemia 02/08/2007    Resolved Hospital Problems  No resolved problems to display.    Discharge Condition: stable  Diet recommendation: heart healthy  Filed Weights   10/06/21 1254  Weight: 95.3 kg    History of present illness: ( per admitting MD Dr Roel Cluck) [name] is a 80 y.o. 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.4 stable, HLD,      Presented with   presyncope, diaphoresis Pt was feeling well in AM finished his exercise went to locker room and set down but felt very fatigued, he felt  nauseous no vomiting, coach checked his pulse and said it was ok No CP no neurological complaints He goes to the Gym 3 times a a week and does 30 min on Elliptical and does well He went to do light exercise in the gym and started to feel very poorly lightheaded and diaphoretic drenched in sweat, slow to respond, given Iv fluids 500 cc ECG showed multiple PVC's With IV fluids mental status  has improved and he was feeling better on arrival to [facility] Course:  Principal Problem:   Postural dizziness with presyncope Active Problems:   Mixed hyperlipidemia   OSA (obstructive sleep apnea)   AAA (abdominal aortic aneurysm) without rupture   Atrial fibrillation ([facility])   Chronic constipation   Elevated TSH   CAD (coronary artery disease)   AAA (abdominal aortic aneurysm)   Renal mass   Elevated troponin   NSTEMI (non-ST elevated myocardial infarction) ([place])  NSTEMI -High-sensitivity troponin increased to 931 -Echo  showed normal systolic function with EF at 55 to 60% with grade 1 diastolic dysfunction -Cardiology consulted who recommended start heparin drip, n.p.o. for possible cath on 12/8 -High-sensitivity troponin peaked at 931.  Went cardiac catheterization noted above with chronic total occlusion of LAD, RCA and circumflex with patent LIMA to LAD, SVG to OM and SVG to PDA.  No culprit lesion found.  Recommendations for continued medical therapy. --Continue aspirin, statin.  Of note beta-blocker stopped in the past in setting of bradycardia. -he is feeling well, no recurrent symptom -he is cleared to discharge by cardiology     PAF In sinus rhythm, not on nodal blocking agent in the past , he did have brief bradycardia on tele On Eliquis Cardiology recommended outpatient cardiac monitoring as  Question if patient's recent episode was related to bradycardia or vagal response following working out associated with diaphoresis.  He is not on beta-blocker therapy   HTN: Continue Home regimen Norvasc, lisinopril  OSA cpap   Procedures: Cardiac cath  Consultations: Cardiology   Discharge Exam: BP (!) 142/79 (BP Location: Right Arm)   Pulse 62   Temp 97.9 F (36.6 C) (Oral)   Resp 18   Wt 95.3 kg   SpO2 96%   BMI 31.01 kg/m   General: NAD Cardiovascular: RRR Respiratory: Normal respiratory effort  Discharge Instructions You were cared for by a  hospitalist during your hospital stay. If you have any questions about your discharge medications or the care you received while you were in the hospital after you are discharged, you can call the unit and asked to speak with the hospitalist on call if the hospitalist that took care of you is not available. Once you are discharged, your primary care physician will handle any further medical issues. Please note that NO REFILLS for any discharge medications will be authorized once you are discharged, as it is imperative that you return to your primary care physician (or establish a relationship with a primary care physician if you do not have one) for your aftercare needs so that they can reassess your need for medications and monitor your lab values.  Discharge Instructions     Diet - low sodium heart healthy   Complete by: As directed    Increase activity slowly   Complete by: As directed       Allergies as of 10/08/2021   No Known Allergies      Medication List     TAKE these medications    amLODipine 10 MG tablet Commonly known as: NORVASC Take 1 tablet (10 mg total) by mouth daily. At night   atorvastatin 40 MG tablet Commonly known as: LIPITOR TAKE 1 TABLET (40 MG TOTAL) BY MOUTH AT BEDTIME.   B-COMPLEX PO Take 1 tablet by mouth daily.   co-enzyme Q-10 30 MG capsule Take 30 mg by mouth daily.   Eliquis 5 MG Tabs tablet Generic drug: apixaban TAKE 1 TABLET TWICE DAILY What changed: how much to take   hydrocortisone 2.5 % cream Apply 1 application topically as needed (skin irritation).   lisinopril 20 MG tablet Commonly known as: ZESTRIL Take 1 tablet (20 mg total) by mouth daily.   magnesium oxide 400 MG tablet Commonly known as: MAG-OX Take 800 mg by mouth daily.   multivitamin with minerals tablet Take 1 tablet by mouth daily.   Omega-3 Fish Oil 1200 MG Caps Take 2 1,200 mg capsules daily What changed:  how much to take how to take this when to take  this additional instructions   polyethylene glycol 17 g packet Commonly known as: MIRALAX / GLYCOLAX Take 17 g by mouth daily.   VITAMIN C ER PO Take 500 mg by mouth daily.       No Known Allergies  Follow-up Information     Colon Branch, MD Follow up.   Specialty: Internal Medicine Why: hospital discharge follow up Contact information: [address] [place] [address] [phone]         [name], MD .   Specialties: Cardiology, Radiology, Interventional Cardiology Contact information: [address] [place] [address] [phone]                  The results of significant diagnostics from this hospitalization (including imaging, microbiology, ancillary and laboratory) are listed below for reference.    Significant Diagnostic Studies: CT Head Wo Contrast  Result Date: 10/06/2021 CLINICAL DATA:  A male at age 26 presents with mental status changes. EXAM: CT HEAD WITHOUT CONTRAST TECHNIQUE: Contiguous axial images were obtained from the base of the skull through the vertex without intravenous contrast. COMPARISON:  None FINDINGS: Brain: No evidence of acute infarction, hemorrhage, hydrocephalus, extra-axial collection or mass lesion/mass effect. Mild generalized atrophy and chronic microvascular ischemic changes. Vascular: No hyperdense vessel or unexpected calcification. Skull: Normal. Negative for fracture or focal lesion. Sinuses/Orbits: No acute finding. Other: None. IMPRESSION: No acute intracranial pathology. Mild atrophy and chronic microvascular ischemic change. Electronically Signed   By: Zetta Bills M.D.   On: 10/06/2021 17:54   CARDIAC CATHETERIZATION  Result Date: 10/07/2021   Prox RCA lesion is 100% stenosed.   Ost Cx to Prox Cx lesion is 100% stenosed.   Dist LM to Prox LAD lesion is 100% stenosed.   SVG graft was visualized by angiography and is normal in caliber.   SVG graft was visualized by  angiography and is normal in caliber.   LIMA graft was visualized by angiography and is normal in caliber.   The graft exhibits no disease. 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 Continue medical management of CAD.   DG Chest Port 1 View  Result Date: 10/06/2021 CLINICAL DATA:  Weakness. EXAM: PORTABLE CHEST 1 VIEW COMPARISON:  July 08, 2014. FINDINGS: Stable cardiomegaly. Status post coronary bypass graft. Both lungs are clear. The visualized skeletal structures are unremarkable. IMPRESSION: No active disease. Electronically Signed   By: Marijo Conception M.D.   On: 10/06/2021 13:13   ECHOCARDIOGRAM COMPLETE  Result Date: 10/07/2021    ECHOCARDIOGRAM REPORT   Patient Name:   KIMM UNGARO Bega Date of Exam: 10/07/2021 Medical Rec #:  [id number]      Height:       69.0 in Accession #:    [id number]     Weight:       210.0 lb Date of Birth:  April 05, 1941      BSA:          2.109 m Patient Age:    80 years       BP:           150/78 mmHg Patient Gender: M              HR:           85 bpm. Exam Location:  Inpatient Procedure: 2D Echo, Cardiac Doppler and Color Doppler Indications:    Elevated Troponin  History:        Patient has prior history of Echocardiogram examinations, most                 recent 01/05/2018. CHF, Previous Myocardial Infarction and CAD,                 Arrythmias:Atrial Fibrillation; Risk Factors:Hypertension and                 Dyslipidemia.  Sonographer:    Bernadene Person RDCS Referring Phys: [place]  Sonographer Comments: No subcostal window. IMPRESSIONS  1. Left ventricular ejection fraction, by estimation, is 55 to 60%. The left ventricle has normal function. The LV endocardium is incompletely visualized, however, no regional wall motion abnormalities seen on limited views. There is moderate hypertrophy of the basal septum.  The rest of the LV segments demonstrate  mild left ventricular hypertrophy. Left ventricular diastolic parameters are consistent with Grade I diastolic dysfunction (impaired relaxation).  2. Right ventricular systolic function is normal. The right ventricular size is normal. Tricuspid regurgitation signal is inadequate for assessing PA pressure.  3. Left atrial size was mild to moderately dilated.  4. The mitral valve is degenerative. Trivial mitral valve regurgitation. Severe mitral annular calcification most notably on the posterior MV annulus.  5. The aortic valve is tricuspid. There is mild calcification of the aortic valve. There is mild thickening of the aortic valve. Aortic valve regurgitation is trivial. Aortic valve sclerosis is present, with no evidence of aortic valve stenosis.  6. Aortic dilatation noted. There is mild dilatation of the ascending aorta, measuring 39 mm. Comparison(s): Compared to prior TTE in 2019, there is no significant change. FINDINGS  Left Ventricle: Left ventricular ejection fraction, by estimation, is 55 to 60%. The left ventricle has normal function. The left ventricle has no regional wall motion abnormalities. The left ventricular internal cavity size was normal in size. There is  moderate hypertrophy of the basal septum. The rest of the LV segments demonstrate mild left ventricular hypertrophy. Left ventricular diastolic parameters are consistent with Grade I diastolic dysfunction (impaired relaxation). Right Ventricle: The right ventricular size is normal. No increase in right ventricular wall thickness. Right ventricular systolic function is normal. Tricuspid regurgitation signal is inadequate for assessing PA pressure. Left Atrium: Left atrial size was mild to moderately dilated. Right Atrium: Right atrial size was normal in size. Pericardium: There is no evidence of pericardial effusion. Mitral Valve: The mitral valve is degenerative in appearance. There is mild thickening  of the mitral valve leaflet(s). There is moderate calcification of the mitral valve leaflet(s). Severe mitral annular calcification. Trivial mitral valve regurgitation. Tricuspid Valve: The tricuspid valve is normal in structure. Tricuspid valve regurgitation is trivial. Aortic Valve: The aortic valve is tricuspid. There is mild calcification of the aortic valve. There is mild thickening of the aortic valve. Aortic valve regurgitation is trivial. Aortic valve sclerosis is present, with no evidence of aortic valve stenosis. Pulmonic Valve: The pulmonic valve was normal in structure. Pulmonic valve regurgitation is trivial. Aorta: Aortic dilatation noted. There is mild dilatation of the ascending aorta, measuring 39 mm. Venous: The inferior vena cava was not well visualized. IAS/Shunts: No atrial level shunt detected by color flow Doppler.  LEFT VENTRICLE PLAX 2D LVIDd:         5.94 cm      Diastology LVIDs:         4.50 cm      LV e' medial:    5.77 cm/s LV PW:         1.24 cm      LV E/e' medial:  23.4 LV IVS:        1.24 cm      LV e' lateral:   6.96 cm/s LVOT diam:     2.20 cm      LV E/e' lateral: 19.4 LV SV:         88 LV SV Index:   42 LVOT Area:     3.80 cm  LV Volumes (MOD) LV vol d, MOD A2C: 106.0 ml LV vol d, MOD A4C: 158.0 ml LV vol s, MOD A2C: 59.5 ml LV vol s, MOD A4C: 77.7 ml LV SV MOD A2C:     46.5 ml LV SV MOD A4C:     158.0 ml LV SV MOD BP:  64.7 ml RIGHT VENTRICLE RV S prime:     7.53 cm/s TAPSE (M-mode): 1.5 cm LEFT ATRIUM             Index        RIGHT ATRIUM           Index LA diam:        4.50 cm 2.13 cm/m   RA Area:     11.10 cm LA Vol (A2C):   77.9 ml 36.93 ml/m  RA Volume:   20.60 ml  9.77 ml/m LA Vol (A4C):   76.8 ml 36.41 ml/m LA Biplane Vol: 79.1 ml 37.50 ml/m  AORTIC VALVE LVOT Vmax:   112.00 cm/s LVOT Vmean:  72.800 cm/s LVOT VTI:    0.232 m  AORTA Ao Root diam: 3.30 cm Ao Asc diam:  3.90 cm MITRAL VALVE MV Area (PHT): 4.06 cm     SHUNTS MV Decel Time: 187 msec     Systemic  VTI:  0.23 m MV E velocity: 135.00 cm/s  Systemic Diam: 2.20 cm MV A velocity: 176.00 cm/s MV E/A ratio:  0.77 Gwyndolyn Kaufman MD Electronically signed by Gwyndolyn Kaufman MD Signature Date/Time: 10/07/2021/11:21:09 AM    Final    CT Angio Chest/Abd/Pel for Dissection W and/or Wo Contrast  Result Date: 10/06/2021 CLINICAL DATA:  Aortic aneurysm, known or suspected. Ha, cp, h/o AAA EXAM: CT ANGIOGRAPHY CHEST, ABDOMEN AND PELVIS TECHNIQUE: Non-contrast CT of the chest was initially obtained. Multidetector CT imaging through the chest, abdomen and pelvis was performed using the standard protocol during bolus administration of intravenous contrast. Multiplanar reconstructed images and MIPs were obtained and reviewed to evaluate the vascular anatomy. CONTRAST:  153mL OMNIPAQUE IOHEXOL 350 MG/ML SOLN COMPARISON:  CT CT angiography abdomen pelvis 05/12/2021. FINDINGS: CTA CHEST FINDINGS Cardiovascular: Preferential opacification of the thoracic aorta. No evidence of thoracic aortic aneurysm or dissection. Severe atherosclerotic plaque. Four-vessel coronary calcification status post coronary artery bypass graft. Severe mitral annular calcification. Normal heart size. No pericardial effusion. The main pulmonary artery is normal in caliber. No central or proximal segmental pulmonary emboli. Mediastinum/Nodes: No enlarged mediastinal, hilar, or axillary lymph nodes. Thyroid gland, trachea, and esophagus demonstrate no significant findings. Lungs/Pleura: No focal consolidation. There is a 0.5 x 0.4 cm right upper lobe pulmonary nodule (8:53). No pulmonary mass. No pleural effusion. No pneumothorax. Musculoskeletal: No chest wall abnormality. No suspicious lytic or blastic osseous lesions. No acute displaced fracture. Multilevel degenerative changes of the spine. Review of the MIP images confirms the above findings. CTA ABDOMEN AND PELVIS FINDINGS VASCULAR Aorta: Stable appearing infrarenal abdominal aorta aneurysm repair  with stent graft extending to bilateral common iliac arteries. Normal suprarenal abdominal aorta caliber. Stable infrarenal abdominal aorta caliber measuring up to 6.7 x 6 cm. No endograft leak. No periaortic fat stranding. Celiac: Similar-appearing at least moderate narrowing of the origin of the celiac artery due to atherosclerotic plaque. Otherwise patent without evidence of aneurysm, dissection, vasculitis or significant stenosis. SMA: Patent without evidence of aneurysm, dissection, vasculitis or significant stenosis. Renals: The origins of the renal arteries are at the level of the aortic stent repair. Similar appearing at least moderate stenosis of the origin left renal artery. Otherwise the renal arteries are patent without evidence of aneurysm, dissection, vasculitis, fibromuscular dysplasia or significant stenosis. 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 Inflow: Severe atherosclerotic plaque distally with marked narrowing of the origin of the left femoral artery. 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 Otherwise patent without evidence of aneurysm, dissection, vasculitis or significant stenosis. Veins: No obvious venous abnormality within the limitations of this arterial phase study. Review of the MIP images confirms the above findings. NON-VASCULAR Hepatobiliary: No focal liver abnormality. No gallstones, gallbladder wall thickening, or pericholecystic fluid. No biliary dilatation. Pancreas: No focal lesion. Normal pancreatic contour. No surrounding inflammatory changes. No main pancreatic ductal dilatation. Spleen: Normal in size without focal abnormality. Adrenals/Urinary Tract: There is a stable 1.4 cm left adrenal gland nodule with a density of 61 Hounsfield units. There is a stable 1.8 cm right adrenal gland nodule with a density of 45 Hounsfield units. Bilateral kidneys enhance symmetrically. Similar-appearing cortical irregularity  of the left inferior renal pole likely related to partial nephrectomy. Stable to possibly slightly increased in size 3.1 x 2.5 cm right renal lesion with a density of 40 Hounsfield units. No hydronephrosis. No hydroureter. The urinary bladder is unremarkable. Stomach/Bowel: Stomach is within normal limits. No evidence of bowel wall thickening or dilatation. No pneumatosis. Query second or third portion of the duodenum diverticula (8:179). Stool throughout the colon. Markedly redundant sigmoid colon. Appendix appears normal. Lymphatic: No lymphadenopathy. Reproductive: Enlarged prostate measuring up to 6 cm. Penile prosthesis noted with balloon pump in the left lower abdomen. No associated findings suggest infection. Other: No intraperitoneal free fluid. No intraperitoneal free gas. No organized fluid collection. Musculoskeletal: Diastasis rectus.  No abdominal wall hernia or abnormality. No suspicious lytic or blastic osseous lesions. No acute displaced fracture. Multilevel degenerative changes of the spine. Review of the MIP images confirms the above findings. IMPRESSION: VASUCLAR: 1. Aortic Atherosclerosis (ICD10-I70.0) with stable marked narrowing of the origin of the left femoral artery as well as at least moderate narrowing of the origin of the celiac and left renal arteries. 2. 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 Aortic aneurysm NOS (ICD10-I71.9). 3. 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 4. Severe mitral annular calcifications. 5. Four-vessel coronary calcifications status post coronary artery bypass graft. 6. No central or proximal segmental pulmonary emboli. NON-VASCULAR 1. Stable to possibly slightly increased in size, indeterminate, 3.1 x 2.5 cm right renal lesion. Recommend MRI renal protocol for further evaluation as a  malignancy is not excluded. 2. Stable indeterminate bilateral adrenal nodularities measuring 1.8 cm on the right and 1.4 cm on the left can further be evaluated on MR renal protocol. 3. Stool throughout the colon. 4. Prostatomegaly. 5. A right upper lobe pulmonary micronodule. No follow-up needed if patient is low-risk. Non-contrast chest CT can be considered in 12 months if patient is high-risk. This recommendation follows the consensus statement: Guidelines for Management of Incidental Pulmonary Nodules Detected on CT Images: From the Fleischner Society 2017; Radiology 2017; 284:228-243. Electronically Signed   By: Iven Finn M.D.   On: 10/06/2021 18:17    Microbiology: Recent Results (from the past 240 hour(s))  Blood culture (routine x 2)     Status: None (Preliminary result)   Collection Time: 10/06/21  3:15 PM   Specimen: Right Antecubital; Blood  Result Value Ref Range Status   Specimen Description RIGHT ANTECUBITAL  Final   Special Requests   Final    BOTTLES DRAWN AEROBIC AND ANAEROBIC Blood Culture adequate volume   Culture   Final    NO GROWTH 2 DAYS Performed at [facility], [address], [place], [place] [address]  Report Status PENDING  Incomplete  Blood culture (routine x 2)     Status: None (Preliminary result)   Collection Time: 10/06/21  3:39 PM   Specimen: BLOOD  Result Value Ref Range Status   Specimen Description BLOOD LEFT ANTECUBITAL  Final   Special Requests   Final    BOTTLES DRAWN AEROBIC AND ANAEROBIC Blood Culture results may not be optimal due to an excessive volume of blood received in culture bottles   Culture   Final    NO GROWTH 2 DAYS Performed at [facility], [place] [address], [place], [place] [address]    Report Status PENDING  Incomplete  Resp Panel by RT-PCR (Flu A&B, Covid) Nasopharyngeal Swab     Status: None   Collection Time: 10/06/21  7:48 PM   Specimen: Nasopharyngeal Swab; Nasopharyngeal(NP) swabs in vial transport medium   Result Value Ref Range Status   SARS Coronavirus 2 by RT PCR NEGATIVE NEGATIVE Final    Comment: (NOTE) SARS-CoV-2 target nucleic acids are NOT DETECTED.  The SARS-CoV-2 RNA is generally detectable in upper respiratory specimens during the acute phase of infection. The lowest concentration of SARS-CoV-2 viral copies this assay can detect is 138 copies/mL. A negative result does not preclude SARS-Cov-2 infection and should not be used as the sole basis for treatment or other patient management decisions. A negative result may occur with  improper specimen collection/handling, submission of specimen other than nasopharyngeal swab, presence of viral mutation(s) within the areas targeted by this assay, and inadequate number of viral copies(<138 copies/mL). A negative result must be combined with clinical observations, patient history, and epidemiological information. The expected result is Negative.  Fact Sheet for Patients:  EntrepreneurPulse.com.au  Fact Sheet for Healthcare Providers:  IncredibleEmployment.be  This test is no t yet approved or cleared by the [place] FDA and  has been authorized for detection and/or diagnosis of SARS-CoV-2 by FDA under an Emergency Use Authorization (EUA). This EUA will remain  in effect (meaning this test can be used) for the duration of the COVID-19 declaration under Section 564(b)(1) of the Act, 21 U.S.C.section 360bbb-3(b)(1), unless the authorization is terminated  or revoked sooner.       Influenza A by PCR NEGATIVE NEGATIVE Final   Influenza B by PCR NEGATIVE NEGATIVE Final    Comment: (NOTE) The Xpert Xpress SARS-CoV-2/FLU/RSV plus assay is intended as an aid in the diagnosis of influenza from Nasopharyngeal swab specimens and should not be used as a sole basis for treatment. Nasal washings and aspirates are unacceptable for Xpert Xpress SARS-CoV-2/FLU/RSV testing.  Fact Sheet for  Patients: EntrepreneurPulse.com.au  Fact Sheet for Healthcare Providers: IncredibleEmployment.be  This test is not yet approved or cleared by the [place] FDA and has been authorized for detection and/or diagnosis of SARS-CoV-2 by FDA under an Emergency Use Authorization (EUA). This EUA will remain in effect (meaning this test can be used) for the duration of the COVID-19 declaration under Section 564(b)(1) of the Act, 21 U.S.C. section 360bbb-3(b)(1), unless the authorization is terminated or revoked.  Performed at [facility], [place] [address] [place], [place] [address]      Labs: Basic Metabolic Panel: Recent Labs  Lab 10/06/21 1250 10/06/21 1308 10/06/21 2017 10/07/21 0407 10/08/21 0118  NA 137 138  --  134* 137  K 4.1 4.0  --  3.7 3.7  CL 101 103  --  104 104  CO2 23  --   --  23 24  GLUCOSE 146* 138*  --  99 93  BUN 18 19  --  14 15  CREATININE 1.08 1.10  --  0.80 0.94  CALCIUM 9.6  --   --  8.9 9.0  MG 1.9  --   --  2.3  --   PHOS  --   --  3.7 3.0  --    Liver Function Tests: Recent Labs  Lab 10/06/21 1250 10/06/21 2017 10/07/21 0407  AST 29 31 31   ALT 25 23 22   ALKPHOS 60 63 58  BILITOT 0.7 0.7 0.6  PROT 6.7 6.5 6.2*  ALBUMIN 4.2 3.8 3.7   No results for input(s): LIPASE, AMYLASE in the last 168 hours. No results for input(s): AMMONIA in the last 168 hours. CBC: Recent Labs  Lab 10/06/21 1250 10/06/21 1308 10/06/21 2017 10/07/21 0407 10/08/21 0118  WBC 8.8  --   --  5.6 4.6  NEUTROABS 6.7  --  4.8 3.3  --   HGB 14.1 13.9  --  13.8 14.1  HCT 40.9 41.0  --  40.4 41.4  MCV 92.5  --   --  93.3 92.6  PLT 129*  --   --  126* 127*   Cardiac Enzymes: Recent Labs  Lab 10/06/21 2017  CKTOTAL 203   BNP: BNP (last 3 results) Recent Labs    10/06/21 1250  BNP 25.5    ProBNP (last 3 results) No results for input(s): PROBNP in the last 8760 hours.  CBG: No results for input(s): GLUCAP in the  last 168 hours.     Signed:  Florencia Reasons MD, PhD, FACP  [facility] 10/08/2021, 5:28 PM

## 2021-10-08 NOTE — Progress Notes (Addendum)
Progress Note  Patient Name: Philip [name] Date of Encounter: 10/08/2021  [facility] Cardiologist: Larae Grooms, MD   Subjective   No chest pain overnight. Feeling well this morning.   Inpatient Medications    Scheduled Meds:  aspirin EC  81 mg Oral Daily   atorvastatin  40 mg Oral QHS   polyethylene glycol  17 g Oral Daily   sodium chloride flush  3 mL Intravenous Q12H   Continuous Infusions:  sodium chloride     lactated ringers Stopped (10/06/21 2146)   PRN Meds: sodium chloride, acetaminophen **OR** acetaminophen, HYDROcodone-acetaminophen, sodium chloride flush   Vital Signs    Vitals:   10/07/21 1725 10/07/21 2107 10/08/21 0053 10/08/21 0624  BP: (!) 147/85 (!) 145/74 138/83 (!) 142/79  Pulse: 68 68 77 60  Resp:  18 18 18   Temp:  97.8 F (36.6 C) 97.8 F (36.6 C) 97.9 F (36.6 C)  TempSrc:  Oral Oral Oral  SpO2: 96% 96% 95% 98%  Weight:        Intake/Output Summary (Last 24 hours) at 10/08/2021 1044 Last data filed at 10/08/2021 0900 Gross per 24 hour  Intake 2659.21 ml  Output 300 ml  Net 2359.21 ml   Last 3 Weights 10/06/2021 08/23/2021 06/02/2021  Weight (lbs) 210 lb 220 lb 8 oz 223 lb  Weight (kg) 95.255 kg 100.018 kg 101.152 kg      Telemetry    SR with episodes of SB (rates in the upper 30s at times) - Personally Reviewed  ECG    No new tracing  Physical Exam   GEN: No acute distress.   Neck: No JVD Cardiac: RRR, no murmurs, rubs, or gallops.  Respiratory: Clear to auscultation bilaterally. GI: Soft, nontender, non-distended  MS: No edema; No deformity. Left radial cath site stable.  Neuro:  Nonfocal  Psych: Normal affect   Labs    High Sensitivity Troponin:   Recent Labs  Lab 10/06/21 2017 10/06/21 2141 10/06/21 2352 10/07/21 0120 10/07/21 0407  TROPONINIHS 417* 631* 99* 908* 931*     Chemistry Recent Labs  Lab 10/06/21 1250 10/06/21 1308 10/06/21 2017 10/07/21 0407 10/08/21 0118  NA 137 138  --  134*  137  K 4.1 4.0  --  3.7 3.7  CL 101 103  --  104 104  CO2 23  --   --  23 24  GLUCOSE 146* 138*  --  99 93  BUN 18 19  --  14 15  CREATININE 1.08 1.10  --  0.80 0.94  CALCIUM 9.6  --   --  8.9 9.0  MG 1.9  --   --  2.3  --   PROT 6.7  --  6.5 6.2*  --   ALBUMIN 4.2  --  3.8 3.7  --   AST 29  --  31 31  --   ALT 25  --  23 22  --   ALKPHOS 60  --  63 58  --   BILITOT 0.7  --  0.7 0.6  --   GFRNONAA >60  --   --  >60 >60  ANIONGAP 13  --   --  7 9    Lipids  Recent Labs  Lab 10/07/21 0407  CHOL 122  TRIG 62  HDL 45  LDLCALC 65  CHOLHDL 2.7    Hematology Recent Labs  Lab 10/06/21 1250 10/06/21 1308 10/07/21 0407 10/08/21 0118  WBC 8.8  --  5.6 4.6  RBC 4.42  --  4.33 4.47  HGB 14.1 13.9 13.8 14.1  HCT 40.9 41.0 40.4 41.4  MCV 92.5  --  93.3 92.6  MCH 31.9  --  31.9 31.5  MCHC 34.5  --  34.2 34.1  RDW 12.4  --  12.6 12.7  PLT 129*  --  126* 127*   Thyroid  Recent Labs  Lab 10/06/21 1737 10/07/21 0407  TSH  --  4.045  FREET4 0.75  --     BNP Recent Labs  Lab 10/06/21 1250  BNP 25.5    DDimer No results for input(s): DDIMER in the last 168 hours.   Radiology    CT Head Wo Contrast  Result Date: 10/06/2021 CLINICAL DATA:  A male at age 80 presents with mental status changes. EXAM: CT HEAD WITHOUT CONTRAST TECHNIQUE: Contiguous axial images were obtained from the base of the skull through the vertex without intravenous contrast. COMPARISON:  None FINDINGS: Brain: No evidence of acute infarction, hemorrhage, hydrocephalus, extra-axial collection or mass lesion/mass effect. Mild generalized atrophy and chronic microvascular ischemic changes. Vascular: No hyperdense vessel or unexpected calcification. Skull: Normal. Negative for fracture or focal lesion. Sinuses/Orbits: No acute finding. Other: None. IMPRESSION: No acute intracranial pathology. Mild atrophy and chronic microvascular ischemic change. Electronically Signed   By: Zetta Bills M.D.   On: 10/06/2021  17:54   CARDIAC CATHETERIZATION  Result Date: 10/07/2021   Prox RCA lesion is 100% stenosed.   Ost Cx to Prox Cx lesion is 100% stenosed.   Dist LM to Prox LAD lesion is 100% stenosed.   SVG graft was visualized by angiography and is normal in caliber.   SVG graft was visualized by angiography and is normal in caliber.   LIMA graft was visualized by angiography and is normal in caliber.   The graft exhibits no disease. 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 Continue medical management of CAD.   DG Chest Port 1 View  Result Date: 10/06/2021 CLINICAL DATA:  Weakness. EXAM: PORTABLE CHEST 1 VIEW COMPARISON:  July 08, 2014. FINDINGS: Stable cardiomegaly. Status post coronary bypass graft. Both lungs are clear. The visualized skeletal structures are unremarkable. IMPRESSION: No active disease. Electronically Signed   By: Marijo Conception M.D.   On: 10/06/2021 13:13   ECHOCARDIOGRAM COMPLETE  Result Date: 10/07/2021    ECHOCARDIOGRAM REPORT   Patient Name:   Philip [name] Date of Exam: 10/07/2021 Medical Rec #:  [id number]      Height:       69.0 in Accession #:    [id number]     Weight:       210.0 lb Date of Birth:  [birth year]      BSA:          2.109 m Patient Age:    80 years       BP:           150/78 mmHg Patient Gender: M              HR:           85 bpm. Exam Location:  Inpatient Procedure: 2D Echo, Cardiac Doppler and Color Doppler Indications:    Elevated Troponin  History:        Patient has prior history of Echocardiogram examinations, most  recent 01/05/2018. CHF, Previous Myocardial Infarction and CAD,                 Arrythmias:Atrial Fibrillation; Risk Factors:Hypertension and                 Dyslipidemia.  Sonographer:    Bernadene Person RDCS Referring Phys: [place]  Sonographer Comments: No subcostal window.  IMPRESSIONS  1. Left ventricular ejection fraction, by estimation, is 55 to 60%. The left ventricle has normal function. The LV endocardium is incompletely visualized, however, no regional wall motion abnormalities seen on limited views. There is moderate hypertrophy of the basal septum. The rest of the LV segments demonstrate mild left ventricular hypertrophy. Left ventricular diastolic parameters are consistent with Grade I diastolic dysfunction (impaired relaxation).  2. Right ventricular systolic function is normal. The right ventricular size is normal. Tricuspid regurgitation signal is inadequate for assessing PA pressure.  3. Left atrial size was mild to moderately dilated.  4. The mitral valve is degenerative. Trivial mitral valve regurgitation. Severe mitral annular calcification most notably on the posterior MV annulus.  5. The aortic valve is tricuspid. There is mild calcification of the aortic valve. There is mild thickening of the aortic valve. Aortic valve regurgitation is trivial. Aortic valve sclerosis is present, with no evidence of aortic valve stenosis.  6. Aortic dilatation noted. There is mild dilatation of the ascending aorta, measuring 39 mm. Comparison(s): Compared to prior TTE in 2019, there is no significant change. FINDINGS  Left Ventricle: Left ventricular ejection fraction, by estimation, is 55 to 60%. The left ventricle has normal function. The left ventricle has no regional wall motion abnormalities. The left ventricular internal cavity size was normal in size. There is  moderate hypertrophy of the basal septum. The rest of the LV segments demonstrate mild left ventricular hypertrophy. Left ventricular diastolic parameters are consistent with Grade I diastolic dysfunction (impaired relaxation). Right Ventricle: The right ventricular size is normal. No increase in right ventricular wall thickness. Right ventricular systolic function is normal. Tricuspid regurgitation signal is inadequate  for assessing PA pressure. Left Atrium: Left atrial size was mild to moderately dilated. Right Atrium: Right atrial size was normal in size. Pericardium: There is no evidence of pericardial effusion. Mitral Valve: The mitral valve is degenerative in appearance. There is mild thickening of the mitral valve leaflet(s). There is moderate calcification of the mitral valve leaflet(s). Severe mitral annular calcification. Trivial mitral valve regurgitation. Tricuspid Valve: The tricuspid valve is normal in structure. Tricuspid valve regurgitation is trivial. Aortic Valve: The aortic valve is tricuspid. There is mild calcification of the aortic valve. There is mild thickening of the aortic valve. Aortic valve regurgitation is trivial. Aortic valve sclerosis is present, with no evidence of aortic valve stenosis. Pulmonic Valve: The pulmonic valve was normal in structure. Pulmonic valve regurgitation is trivial. Aorta: Aortic dilatation noted. There is mild dilatation of the ascending aorta, measuring 39 mm. Venous: The inferior vena cava was not well visualized. IAS/Shunts: No atrial level shunt detected by color flow Doppler.  LEFT VENTRICLE PLAX 2D LVIDd:         5.94 cm      Diastology LVIDs:         4.50 cm      LV e' medial:    5.77 cm/s LV PW:         1.24 cm      LV E/e' medial:  23.4 LV IVS:  1.24 cm      LV e' lateral:   6.96 cm/s LVOT diam:     2.20 cm      LV E/e' lateral: 19.4 LV SV:         88 LV SV Index:   42 LVOT Area:     3.80 cm  LV Volumes (MOD) LV vol d, MOD A2C: 106.0 ml LV vol d, MOD A4C: 158.0 ml LV vol s, MOD A2C: 59.5 ml LV vol s, MOD A4C: 77.7 ml LV SV MOD A2C:     46.5 ml LV SV MOD A4C:     158.0 ml LV SV MOD BP:      64.7 ml RIGHT VENTRICLE RV S prime:     7.53 cm/s TAPSE (M-mode): 1.5 cm LEFT ATRIUM             Index        RIGHT ATRIUM           Index LA diam:        4.50 cm 2.13 cm/m   RA Area:     11.10 cm LA Vol (A2C):   77.9 ml 36.93 ml/m  RA Volume:   20.60 ml  9.77 ml/m LA Vol  (A4C):   76.8 ml 36.41 ml/m LA Biplane Vol: 79.1 ml 37.50 ml/m  AORTIC VALVE LVOT Vmax:   112.00 cm/s LVOT Vmean:  72.800 cm/s LVOT VTI:    0.232 m  AORTA Ao Root diam: 3.30 cm Ao Asc diam:  3.90 cm MITRAL VALVE MV Area (PHT): 4.06 cm     SHUNTS MV Decel Time: 187 msec     Systemic VTI:  0.23 m MV E velocity: 135.00 cm/s  Systemic Diam: 2.20 cm MV A velocity: 176.00 cm/s MV E/A ratio:  0.77 Gwyndolyn Kaufman MD Electronically signed by Gwyndolyn Kaufman MD Signature Date/Time: 10/07/2021/11:21:09 AM    Final    CT Angio Chest/Abd/Pel for Dissection W and/or Wo Contrast  Result Date: 10/06/2021 CLINICAL DATA:  Aortic aneurysm, known or suspected. Ha, cp, h/o AAA EXAM: CT ANGIOGRAPHY CHEST, ABDOMEN AND PELVIS TECHNIQUE: Non-contrast CT of the chest was initially obtained. Multidetector CT imaging through the chest, abdomen and pelvis was performed using the standard protocol during bolus administration of intravenous contrast. Multiplanar reconstructed images and MIPs were obtained and reviewed to evaluate the vascular anatomy. CONTRAST:  140mL OMNIPAQUE IOHEXOL 350 MG/ML SOLN COMPARISON:  CT CT angiography abdomen pelvis 05/12/2021. FINDINGS: CTA CHEST FINDINGS Cardiovascular: Preferential opacification of the thoracic aorta. No evidence of thoracic aortic aneurysm or dissection. Severe atherosclerotic plaque. Four-vessel coronary calcification status post coronary artery bypass graft. Severe mitral annular calcification. Normal heart size. No pericardial effusion. The main pulmonary artery is normal in caliber. No central or proximal segmental pulmonary emboli. Mediastinum/Nodes: No enlarged mediastinal, hilar, or axillary lymph nodes. Thyroid gland, trachea, and esophagus demonstrate no significant findings. Lungs/Pleura: No focal consolidation. There is a 0.5 x 0.4 cm right upper lobe pulmonary nodule (8:53). No pulmonary mass. No pleural effusion. No pneumothorax. Musculoskeletal: No chest wall abnormality.  No suspicious lytic or blastic osseous lesions. No acute displaced fracture. Multilevel degenerative changes of the spine. Review of the MIP images confirms the above findings. 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 Normal suprarenal abdominal aorta caliber. Stable infrarenal abdominal aorta caliber measuring up to 6.7 x 6 cm. No endograft leak. No periaortic fat stranding. Celiac: Similar-appearing  at least moderate narrowing of the origin of the celiac artery due to atherosclerotic plaque. Otherwise patent without evidence of aneurysm, dissection, vasculitis or significant stenosis. SMA: Patent without evidence of aneurysm, dissection, vasculitis or significant stenosis. Renals: The origins of the renal arteries are at the level of the aortic stent repair. Similar appearing at least moderate stenosis of the origin left renal artery. Otherwise the renal arteries are patent without evidence of aneurysm, dissection, vasculitis, fibromuscular dysplasia or significant stenosis. 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 Inflow: Severe atherosclerotic plaque distally with marked narrowing of the origin of the left femoral artery. 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 Otherwise patent without evidence of aneurysm, dissection, vasculitis or significant stenosis. Veins: No obvious venous abnormality within the limitations of this arterial phase study. Review of the MIP images confirms the above findings. NON-VASCULAR Hepatobiliary: No focal liver abnormality. No gallstones, gallbladder wall thickening, or pericholecystic fluid. No biliary dilatation. Pancreas: No focal lesion. Normal pancreatic contour. No surrounding inflammatory changes. No main pancreatic ductal dilatation. Spleen: Normal in size without focal abnormality.  Adrenals/Urinary Tract: There is a stable 1.4 cm left adrenal gland nodule with a density of 61 Hounsfield units. There is a stable 1.8 cm right adrenal gland nodule with a density of 45 Hounsfield units. Bilateral kidneys enhance symmetrically. Similar-appearing cortical irregularity of the left inferior renal pole likely related to partial nephrectomy. Stable to possibly slightly increased in size 3.1 x 2.5 cm right renal lesion with a density of 40 Hounsfield units. No hydronephrosis. No hydroureter. The urinary bladder is unremarkable. Stomach/Bowel: Stomach is within normal limits. No evidence of bowel wall thickening or dilatation. No pneumatosis. Query second or third portion of the duodenum diverticula (8:179). Stool throughout the colon. Markedly redundant sigmoid colon. Appendix appears normal. Lymphatic: No lymphadenopathy. Reproductive: Enlarged prostate measuring up to 6 cm. Penile prosthesis noted with balloon pump in the left lower abdomen. No associated findings suggest infection. Other: No intraperitoneal free fluid. No intraperitoneal free gas. No organized fluid collection. Musculoskeletal: Diastasis rectus.  No abdominal wall hernia or abnormality. No suspicious lytic or blastic osseous lesions. No acute displaced fracture. Multilevel degenerative changes of the spine. Review of the MIP images confirms the above findings. IMPRESSION: VASUCLAR: 1. Aortic Atherosclerosis (ICD10-I70.0) with stable marked narrowing of the origin of the left femoral artery as well as at least moderate narrowing of the origin of the celiac and left renal arteries. 2. 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 Aortic aneurysm NOS (ICD10-I71.9). 3. 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 4. Severe mitral annular calcifications. 5. Four-vessel coronary  calcifications status post coronary artery bypass graft. 6. No central or proximal segmental pulmonary emboli. NON-VASCULAR 1. Stable to possibly slightly increased in size, indeterminate, 3.1 x 2.5 cm right renal lesion. Recommend MRI renal protocol for further evaluation as a malignancy is not excluded. 2. Stable indeterminate bilateral adrenal nodularities measuring 1.8 cm on the right and 1.4 cm on the left can further be evaluated on MR renal protocol. 3. Stool throughout the colon. 4. Prostatomegaly. 5. A right upper lobe pulmonary micronodule. No follow-up needed if patient is low-risk. Non-contrast chest CT can be considered in 12 months if patient is high-risk. This recommendation follows the consensus statement: Guidelines for Management of Incidental Pulmonary Nodules Detected on CT Images: From the Fleischner Society 2017; Radiology  2017; 259:563-875. Electronically Signed   By: Iven Finn M.D.   On: 10/06/2021 18:17    Cardiac Studies   Cath: 10/07/21    Prox RCA lesion is 100% stenosed.   Ost Cx to Prox Cx lesion is 100% stenosed.   Dist LM to Prox LAD lesion is 100% stenosed.   SVG graft was visualized by angiography and is normal in caliber.   SVG graft was visualized by angiography and is normal in caliber.   LIMA graft was visualized by angiography and is normal in caliber.   The graft exhibits no disease.   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 Continue medical management of CAD.   Diagnostic Dominance: Right Intervention  Echo: 10/07/21  IMPRESSIONS     1. Left ventricular ejection fraction, by estimation, is 55 to 60%. The  left ventricle has normal function. The LV endocardium is incompletely  visualized, however, no regional wall motion abnormalities seen on limited  views. There is moderate   hypertrophy of the basal septum. The rest of the LV segments demonstrate  mild left ventricular hypertrophy. Left ventricular diastolic parameters  are consistent with Grade I diastolic dysfunction (impaired relaxation).   2. Right ventricular systolic function is normal. The right ventricular  size is normal. Tricuspid regurgitation signal is inadequate for assessing  PA pressure.   3. Left atrial size was mild to moderately dilated.   4. The mitral valve is degenerative. Trivial mitral valve regurgitation.  Severe mitral annular calcification most notably on the posterior MV  annulus.   5. The aortic valve is tricuspid. There is mild calcification of the  aortic valve. There is mild thickening of the aortic valve. Aortic valve  regurgitation is trivial. Aortic valve sclerosis is present, with no  evidence of aortic valve stenosis.   6. Aortic dilatation noted. There is mild dilatation of the ascending  aorta, measuring 39 mm.   Comparison(s): Compared to prior TTE in 2019, there is no significant  change.   Patient Profile     80 y.o. male with a hx of CAD s/p CABG who was seen on 10/06/2021 for the evaluation of Chest pain at the request of Dr. Armandina Gemma.  Assessment & Plan    NSTEMI: High-sensitivity troponin peaked at 931.  Went cardiac catheterization noted above with chronic total occlusion of LAD, RCA and circumflex with patent LIMA to LAD, SVG to OM and SVG to PDA.  No culprit lesion found.  Recommendations for continued medical therapy. --Continue aspirin, statin.  Of note beta-blocker stopped in the past in setting of bradycardia.  Paroxysmal atrial fibrillation: Has been in sinus rhythm throughout admission.  We will plan to resume Eliquis today.  Intermittent sinus bradycardia: Has had some heart rates noted in the upper 30s on telemetry he has been asymptomatic with these episodes.  Of note he has not been on beta-blocker therapy PTA in the setting of history of  bradycardia. --Plan for 30-day outpatient monitor at discharge  Hypertension: Stable --Continue amlodipine 10 mg daily, lisinopril 20 mg daily at discharge  OSA: CPAP  Hyperlipidemia: On atorvastatin 40 mg daily  [facility] will sign off.   Medication Recommendations:  continue home medications Other recommendations (labs, testing, etc):  outpatient monitor Follow up as an outpatient:  will arrange  For questions or updates, please  contact [place] Please consult [email or web address] for contact info under        Signed, Reino Bellis, NP  10/08/2021, 10:44 AM     Patient seen and examined. Agree with assessment and plan.  Patient feels well today without recurrent chest pain or shortness of breath.  Echo Doppler study showed normal systolic function with EF at 55 to 60% with grade 1 diastolic dysfunction.  There is mild to moderate left atrial dilatation.  There is severe mitral annular calcification most prominent on the posterior mitral valve annulus.  There is mild aortic sclerosis without stenosis.  I personally reviewed his catheterization findings.  He has total proximal occlusion of all native vessels with widely patent grafts.  Question if patient's recent episode was related to bradycardia or vagal response following working out associated with diaphoresis.  He is not on beta-blocker therapy.  Left radial cath site stable.  Resume Eliquis today consider outpatient event monitor.  Okay for discharge today with follow-up with Dr. Irish Lack.   Troy Sine, MD, [facility] 10/08/2021 11:06 AM

## 2021-10-08 NOTE — Progress Notes (Signed)
Nursing Dc note  Patient alert and oriented. Both patient and wife Inez Catalina verbalized understanding of dc instructions . Dc documents and belongings given to patient. [facility]cmd notified of dc order. Piv sites unremarkable.

## 2021-10-11 ENCOUNTER — Telehealth: Payer: Self-pay

## 2021-10-11 LAB — CULTURE, BLOOD (ROUTINE X 2)
Culture: NO GROWTH
Culture: NO GROWTH
Special Requests: ADEQUATE

## 2021-10-11 NOTE — Telephone Encounter (Signed)
Transition Care Management Follow-up Telephone Call Date of discharge and from where: 10/08/2021-[place] How have you been since you were released from the hospital? Per wife-feeling much better. Any questions or concerns? No  Items Reviewed: Did the pt receive and understand the discharge instructions provided? Yes  Medications obtained and verified? Yes  Other? Yes  Any new allergies since your discharge? No  Dietary orders reviewed? Yes Do you have support at home? Yes   Home Care and Equipment/Supplies: Were home health services ordered? no If so, what is the name of the agency? N/a  Has the agency set up a time to come to the patient's home? not applicable Were any new equipment or medical supplies ordered?  No What is the name of the medical supply agency? N/a Were you able to get the supplies/equipment? not applicable Do you have any questions related to the use of the equipment or supplies? N/a  Functional Questionnaire: (I = Independent and D = Dependent) ADLs: I  Bathing/Dressing- I  Meal Prep- I  Eating- I  Maintaining continence- I  Transferring/Ambulation- I  Managing Meds- I  Follow up appointments reviewed:  PCP Hospital f/u appt confirmed? Yes  Scheduled to see Dr. Larose Kells on 10/18/2021 @ 3:00. [place] [facility] f/u appt confirmed? No  Cardiology to schedule Are transportation arrangements needed? No  If their condition worsens, is the pt aware to call PCP or go to the Emergency Dept.? Yes Was the patient provided with contact information for the PCP's office or ED? Yes Was to pt encouraged to call back with questions or concerns? Yes

## 2021-10-14 ENCOUNTER — Encounter: Payer: Self-pay | Admitting: Interventional Cardiology

## 2021-10-14 ENCOUNTER — Ambulatory Visit (INDEPENDENT_AMBULATORY_CARE_PROVIDER_SITE_OTHER): Payer: Medicare HMO

## 2021-10-14 DIAGNOSIS — R001 Bradycardia, unspecified: Secondary | ICD-10-CM | POA: Diagnosis not present

## 2021-10-15 DIAGNOSIS — R001 Bradycardia, unspecified: Secondary | ICD-10-CM | POA: Diagnosis not present

## 2021-10-18 ENCOUNTER — Encounter: Payer: Self-pay | Admitting: Internal Medicine

## 2021-10-18 ENCOUNTER — Ambulatory Visit (INDEPENDENT_AMBULATORY_CARE_PROVIDER_SITE_OTHER): Payer: Medicare HMO | Admitting: Internal Medicine

## 2021-10-18 VITALS — BP 126/74 | HR 74 | Temp 98.3°F | Resp 18 | Ht 69.0 in | Wt 225.0 lb

## 2021-10-18 DIAGNOSIS — I480 Paroxysmal atrial fibrillation: Secondary | ICD-10-CM | POA: Diagnosis not present

## 2021-10-18 DIAGNOSIS — I10 Essential (primary) hypertension: Secondary | ICD-10-CM | POA: Diagnosis not present

## 2021-10-18 DIAGNOSIS — I214 Non-ST elevation (NSTEMI) myocardial infarction: Secondary | ICD-10-CM

## 2021-10-18 DIAGNOSIS — I2510 Atherosclerotic heart disease of native coronary artery without angina pectoris: Secondary | ICD-10-CM | POA: Diagnosis not present

## 2021-10-18 DIAGNOSIS — N289 Disorder of kidney and ureter, unspecified: Secondary | ICD-10-CM

## 2021-10-18 NOTE — Patient Instructions (Signed)
Check the  blood pressure regularly BP GOAL is between 110/65 and  135/85. If it is consistently higher or lower, let me know     GO TO THE LAB : Get the blood work     See you in March as a schedule.  Call anytime if you have any symptoms or problems

## 2021-10-18 NOTE — Progress Notes (Signed)
Subjective:    Patient ID: Philip [name], male    DOB: 1941-10-06, 80 y.o.   MRN: [id number]  DOS:  10/18/2021 Type of visit - description: TCM 14  The patient was admitted to hospital 10/06/2021 and discharged 2 days later. Admitted with presyncope, diaphoresis. He was at the gym working very hard as usual and he felt suddenly lightheaded, nauseated and had to sit down. No LOC. No chest pain, difficulty breathing or palpitations. Went to the ER:  At the hospital, high sensitive troponin increased, Dx with non-STEMI. Echo: Good EF, grade 1 diastolic dysfunction. Cardiac catheterization performed: No culprit lesions, recommend medical therapy.  PAF: Noted to be in sinus rhythm.  With brief episode of bradycardia on telemetry. Question if the presyncopal event was bradycardia related.  CT head normal CT abdomen and pelvis:    1. Aortic Atherosclerosis (ICD10-I70.0) with stable marked narrowing of the origin of the left femoral artery as well as at least moderate narrowing of the origin of the celiac and left renal arteries. 2. 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 Aortic aneurysm NOS (ICD10-I71.9). 3. 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 4. Severe mitral annular calcifications. 5. Four-vessel coronary calcifications status post coronary artery bypass graft. 6. No central or proximal segmental pulmonary emboli.   NON-VASCULAR   1. Stable to possibly slightly increased in size, indeterminate, 3.1 x 2.5 cm right renal lesion. Recommend MRI renal protocol for further evaluation as a malignancy is not excluded. 2. Stable indeterminate bilateral adrenal nodularities measuring 1.8 cm on the right and 1.4 cm on the left can further be evaluated on MR renal protocol. 3. Stool throughout the colon. 4.  Prostatomegaly. 5. A right upper lobe pulmonary micronodule. No follow-up needed if patient is low-risk. Non-contrast chest CT can be considered in 12 months if patient is high-risk. This recommendation follows the consensus statement: Guidelines for Management of Incidental Pulmonary Nodules Detected on CT Images: From the Fleischner Society 2017; Radiology 2017; 284:228-243.    Review of Systems Since he left the hospital he is feeling well. He is back at the gym working hard. Side of the cardiac catheterization (left wrist): Denies any swelling, pain, numbness @ L arm. No chest pain, difficulty breathing or palpitations. No lightheadedness.   Past Medical History:  Diagnosis Date   AAA (abdominal aortic aneurysm)    Atrial fibrillation (HCC)    CAD (coronary artery disease)    had a MI , s/p CABG   Cataracts, bilateral    immature   CHF (congestive heart failure) (HCC)    Dysrhythmia    paroximal a fib   GERD (gastroesophageal reflux disease)    takes Omeprazole daily   History of colon polyps    History of kidney stones    History of shingles    Hyperlipidemia    Hypertension    takes Amlodipine,Metoprolol,and Lisinopril daily   Joint pain    Joint swelling    Myocardial infarction ([place])    several with last one being 2001(but never knew about any except 2001)   OSA (obstructive sleep apnea)    started CPAP 12/09   Peripheral vascular disease ([place])    Peyronie's disease    s/p penile implant in 2006   Pneumonia    as a child   Skin cancer    several , one of them was melanoma (sees derm  routinely)   Tingling    feet occasionally   Tinnitus     Past Surgical History:  Procedure Laterality Date   ABDOMINAL AORTAGRAM N/A 06/16/2014   Procedure: ABDOMINAL Maxcine Ham;  Surgeon: Angelia Mould, MD;  Location: [facility] CATH LAB;  Service: Cardiovascular;  Laterality: N/A;   ABDOMINAL AORTIC ENDOVASCULAR STENT GRAFT N/A 07/08/2014   Procedure: ABDOMINAL AORTIC  ENDOVASCULAR STENT GRAFT/ GORE;  Surgeon: Angelia Mould, MD;  Location: [facility];  Service: Vascular;  Laterality: N/A;   CARDIAC CATHETERIZATION  10/31/2013   COLONOSCOPY     CORONARY ARTERY BYPASS GRAFT  10/31/1997   x 3 vessels   LEFT HEART CATH AND CORS/GRAFTS ANGIOGRAPHY N/A 10/07/2021   Procedure: LEFT HEART CATH AND CORS/GRAFTS ANGIOGRAPHY;  Surgeon: Burnell Blanks, MD;  Location: [place] CV LAB;  Service: Cardiovascular;  Laterality: N/A;   LEFT HEART CATHETERIZATION WITH CORONARY/GRAFT ANGIOGRAM N/A 05/29/2014   Procedure: LEFT HEART CATHETERIZATION WITH [name];  Surgeon: [name], MD;  Location[facility] LAB;  Service: Cardiovascular;  Laterality: N/A;   PENILE PROSTHESIS IMPLANT  08/31/2005   AMS inflatable penile prosthesis   RHINOPLASTY  10/31/1973    Allergies as of 10/18/2021   No Known Allergies      Medication List        Accurate as of October 18, 2021 11:59 PM. If you have any questions, ask your nurse or doctor.          amLODipine 10 MG tablet Commonly known as: NORVASC Take 1 tablet (10 mg total) by mouth daily. At night   atorvastatin 40 MG tablet Commonly known as: LIPITOR TAKE 1 TABLET (40 MG TOTAL) BY MOUTH AT BEDTIME.   B-COMPLEX PO Take 1 tablet by mouth daily.   co-enzyme Q-10 30 MG capsule Take 30 mg by mouth daily.   Eliquis 5 MG Tabs tablet Generic drug: apixaban TAKE 1 TABLET TWICE DAILY What changed: how much to take   hydrocortisone 2.5 % cream Apply 1 application topically as needed (skin irritation).   lisinopril 20 MG tablet Commonly known as: ZESTRIL Take 1 tablet (20 mg total) by mouth daily.   magnesium oxide 400 MG tablet Commonly known as: MAG-OX Take 800 mg by mouth daily.   multivitamin with minerals tablet Take 1 tablet by mouth daily.   Omega-3 Fish Oil 1200 MG Caps Take 2 1,200 mg capsules daily What changed:  how much to take how to take this when to take  this additional instructions   polyethylene glycol 17 g packet Commonly known as: MIRALAX / GLYCOLAX Take 17 g by mouth daily.   VITAMIN C ER PO Take 500 mg by mouth daily.           Objective:   Physical Exam BP 126/74 (BP Location: Left Arm, Patient Position: Sitting, Cuff Size: Normal)    Pulse 74    Temp 98.3 F (36.8 C) (Oral)    Resp 18    Ht 5\' 9"  (1.753 m)    Wt 225 lb (102.1 kg)    SpO2 97%    BMI 33.23 kg/m  General:   Well developed, NAD, BMI noted. HEENT:  Normocephalic . Face symmetric, atraumatic Lungs:  CTA B Normal respiratory effort, no intercostal retractions, no accessory muscle use. Heart: RRR,  no murmur. Abdomen: Soft, nontender, good bowel sounds Lower extremities: trace pretibial edema bilaterally  Skin: Not pale. Not jaundice Neurologic:  alert & oriented X3.  Speech normal, gait appropriate for  age and unassisted Psych--  Cognition and judgment appear intact.  Cooperative with normal attention span and concentration.  Behavior appropriate. No anxious or depressed appearing.      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 Fib, paroxysmal  GERD  Chronic constipation OSA- Cpap (Dr [name]) ED- penile implant H/o skin cancer, Dr. [name] H/o Kidney stones 2016  PLAN:  TCM 14 NSTEMI: Presented to the hospital with lightheadedness, nausea.  Work-up showed a non-STEMI, cath showed no culprit lesion.  He did have some bradycardia (not on any medications that could trigger that). Was discharged in good condition.  He is back to the gym, exercising hard, he plays golf. Plan: Continue present care, check BMP, CBC.  Be alert of any new cardiac symptoms and  promptly seek medical attention. HTN: Well-controlled, on amlodipine, Zestril. Paroxysmal A. fib: Seems to be in sinus today, anticoagulated. Abnormal CT abdomen: R renal lesion, bilateral adrenal nodularities found, radiology recommends MRI with renal protocol and the patient is in complete agreement. Will schedule. RTC already scheduled for 12-2020  This visit occurred during the SARS-CoV-2 public health emergency.  Safety protocols were in place, including screening questions prior to the visit, additional usage of staff PPE, and extensive cleaning of exam room while observing appropriate contact time as indicated for disinfecting solutions.

## 2021-10-19 ENCOUNTER — Other Ambulatory Visit (INDEPENDENT_AMBULATORY_CARE_PROVIDER_SITE_OTHER): Payer: Medicare HMO

## 2021-10-19 DIAGNOSIS — I214 Non-ST elevation (NSTEMI) myocardial infarction: Secondary | ICD-10-CM

## 2021-10-19 LAB — BASIC METABOLIC PANEL
BUN: 17 mg/dL (ref 6–23)
CO2: 28 mEq/L (ref 19–32)
Calcium: 9.6 mg/dL (ref 8.4–10.5)
Chloride: 103 mEq/L (ref 96–112)
Creatinine, Ser: 0.95 mg/dL (ref 0.40–1.50)
GFR: 75.12 mL/min (ref >60.00)
Glucose, Bld: 98 mg/dL (ref 70–99)
Potassium: 4.4 mEq/L (ref 3.5–5.1)
Sodium: 137 mEq/L (ref 135–145)

## 2021-10-19 LAB — CBC WITH DIFFERENTIAL/PLATELET
Basophils Absolute: 0 10*3/uL (ref 0.0–0.1)
Basophils Relative: 0.8 % (ref 0.0–3.0)
Eosinophils Absolute: 0.1 10*3/uL (ref 0.0–0.7)
Eosinophils Relative: 1.4 % (ref 0.0–5.0)
HCT: 40 % (ref 39.0–52.0)
Hemoglobin: 13.8 g/dL (ref 13.0–17.0)
Lymphocytes Relative: 29.8 % (ref 12.0–46.0)
Lymphs Abs: 1.8 10*3/uL (ref 0.7–4.0)
MCHC: 34.4 g/dL (ref 30.0–36.0)
MCV: 92.7 fl (ref 78.0–100.0)
Monocytes Absolute: 0.7 10*3/uL (ref 0.1–1.0)
Monocytes Relative: 12.2 % — ABNORMAL HIGH (ref 3.0–12.0)
Neutro Abs: 3.3 10*3/uL (ref 1.4–7.7)
Neutrophils Relative %: 55.8 % (ref 43.0–77.0)
Platelets: 146 10*3/uL — ABNORMAL LOW (ref 150.0–400.0)
RBC: 4.31 Mil/uL (ref 4.22–5.81)
RDW: 12.9 % (ref 11.5–15.5)
WBC: 6 10*3/uL (ref 4.0–10.5)

## 2021-10-19 NOTE — Assessment & Plan Note (Signed)
TCM 14 NSTEMI: Presented to the hospital with lightheadedness, nausea.  Work-up showed a non-STEMI, cath showed no culprit lesion.  He did have some bradycardia (not on any medications that could trigger that). Was discharged in good condition.  He is back to the gym, exercising hard, he plays golf. Plan: Continue present care, check BMP, CBC.  Be alert of any new cardiac symptoms and promptly seek medical attention. HTN: Well-controlled, on amlodipine, Zestril. Paroxysmal A. fib: Seems to be in sinus today, anticoagulated. Abnormal CT abdomen: R renal lesion, bilateral adrenal nodularities found, radiology recommends MRI with renal protocol and the patient is in complete agreement. Will schedule. RTC already scheduled for 12-2020

## 2021-10-27 ENCOUNTER — Telehealth: Payer: Self-pay | Admitting: Interventional Cardiology

## 2021-10-27 NOTE — Telephone Encounter (Signed)
° °  Cardiac Monitor Alert  Date of alert:  10/27/2021   Patient Name: Philip [name]  DOB: 10-Oct-1941  MRN: [id number]   [facility] Cardiologist: Larae Grooms, MD  [facility] EP:  None    Monitor Information: Cardiac Event Monitor [[employer]]  Reason:  Bradycardia Ordering provider:  [facility]   Alert Atrial Fibrillation/Flutter This is the 1st alert for this rhythm.  The patient has a hx of Atrial Fibrillation/Flutter.  The patient is currently on anticoagulation. Anticoagulation medication as of 10/27/2021           ELIQUIS 5 MG TABS tablet TAKE 1 TABLET TWICE DAILY       Next Cardiology Appointment   Date:  01/26/2022  Provider:  Irish Lack  The patient was contacted today.  He is asymptomatic. Arrhythmia, symptoms and history reviewed with Dr Irish Lack.  Plan:  Continue monitoring    Other: Pt is taking medications as prescribed.  Thora Lance, RN  10/27/2021 4:12 PM

## 2021-10-27 NOTE — Telephone Encounter (Signed)
Calling to report critical ekg results

## 2021-11-02 ENCOUNTER — Ambulatory Visit: Payer: Medicare HMO | Admitting: Interventional Cardiology

## 2021-11-02 ENCOUNTER — Telehealth: Payer: Self-pay | Admitting: Interventional Cardiology

## 2021-11-02 NOTE — Telephone Encounter (Signed)
I spoke with patient. He reports he received message that monitor was showing poor skin contact.  After showering this morning he removed the monitor.  Skin was raw and he had brownish yellow drainage.   I advised patient to contact PCP to have skin area evaluated.  Patient will contact [employer] and request sensitive skin alternative.

## 2021-11-02 NOTE — Telephone Encounter (Signed)
Pt is wearing a heart monitor, pt removed the monitor today and noticed he had a skin infection. Pt wants to know what he should do moving forward. Pt's 30 days is up on 11/14/21

## 2021-11-04 ENCOUNTER — Encounter (HOSPITAL_COMMUNITY): Payer: Self-pay | Admitting: Radiology

## 2021-11-06 ENCOUNTER — Other Ambulatory Visit: Payer: Self-pay

## 2021-11-06 ENCOUNTER — Ambulatory Visit (HOSPITAL_BASED_OUTPATIENT_CLINIC_OR_DEPARTMENT_OTHER)
Admission: RE | Admit: 2021-11-06 | Discharge: 2021-11-06 | Disposition: A | Discharge status: Home / Self Care | Payer: Medicare HMO | Source: Ambulatory Visit | Attending: Internal Medicine | Admitting: Internal Medicine

## 2021-11-06 DIAGNOSIS — N289 Disorder of kidney and ureter, unspecified: Secondary | ICD-10-CM | POA: Diagnosis not present

## 2021-11-06 DIAGNOSIS — N281 Cyst of kidney, acquired: Secondary | ICD-10-CM | POA: Diagnosis not present

## 2021-11-06 DIAGNOSIS — E279 Disorder of adrenal gland, unspecified: Secondary | ICD-10-CM | POA: Diagnosis not present

## 2021-11-06 DIAGNOSIS — K862 Cyst of pancreas: Secondary | ICD-10-CM | POA: Diagnosis not present

## 2021-11-06 DIAGNOSIS — D3500 Benign neoplasm of unspecified adrenal gland: Secondary | ICD-10-CM | POA: Diagnosis not present

## 2021-11-06 MED ORDER — GADOBUTROL 1 MMOL/ML IV SOLN
10.0000 mL | Freq: Once | INTRAVENOUS | Status: AC | PRN
  Administered 2021-11-06: 10 mL via INTRAVENOUS

## 2021-11-08 DIAGNOSIS — Z961 Presence of intraocular lens: Secondary | ICD-10-CM | POA: Diagnosis not present

## 2021-11-08 DIAGNOSIS — Z135 Encounter for screening for eye and ear disorders: Secondary | ICD-10-CM | POA: Diagnosis not present

## 2021-11-08 DIAGNOSIS — H524 Presbyopia: Secondary | ICD-10-CM | POA: Diagnosis not present

## 2021-11-11 ENCOUNTER — Ambulatory Visit (INDEPENDENT_AMBULATORY_CARE_PROVIDER_SITE_OTHER): Payer: Medicare HMO | Admitting: Pharmacist

## 2021-11-11 DIAGNOSIS — E782 Mixed hyperlipidemia: Secondary | ICD-10-CM

## 2021-11-11 DIAGNOSIS — I10 Essential (primary) hypertension: Secondary | ICD-10-CM

## 2021-11-11 DIAGNOSIS — I2510 Atherosclerotic heart disease of native coronary artery without angina pectoris: Secondary | ICD-10-CM

## 2021-11-11 DIAGNOSIS — E079 Disorder of thyroid, unspecified: Secondary | ICD-10-CM

## 2021-11-11 DIAGNOSIS — R739 Hyperglycemia, unspecified: Secondary | ICD-10-CM

## 2021-11-11 DIAGNOSIS — I480 Paroxysmal atrial fibrillation: Secondary | ICD-10-CM

## 2021-11-11 NOTE — Patient Instructions (Signed)
Mr. Philip [name]  It was a pleasure speaking with you today.  I have attached a summary of our visit today and information about your health goals.   Our next appointment is by telephone on Thursday, May 12, 2022 at 8:30am  Please call the care guide team at [phone] if you need to cancel or reschedule your appointment.    If you have any questions or concerns, please feel free to contact me either at the phone number below or with a MyChart message.   Keep up the good work!  Cherre Robins, PharmD Clinical Pharmacist [facility] [phone] (direct line)  [phone] (main office number)   CARE PLAN ENTRY (Updated 11/11/2021)   Hypertension BP Readings from Last 3 Encounters:  10/18/21 126/74  10/08/21 (!) 142/79  08/23/21 (!) 145/80   Pharmacist Clinical Goal(s): Over the next 180 days, patient will work with PharmD and providers to maintain BP goal <130/80  Current regimen:  Amlodipine 10mg  daily in evening Lisinopril 20mg  daily in morning Interventions: Discussed blood pressure goal Patient self care activities - Over the next 180 days, patient will: Maintain blood pressure <130/80 Continue current blood pressure medication regimen  Hyperlipidemia Lab Results  Component Value Date/Time   LDLCALC 65 10/07/2021 04:07 AM   LDLCALC 66 09/09/2020 08:42 AM   LDLDIRECT 68.0 04/13/2015 08:23 AM   Pharmacist Clinical Goal(s): Over the next 180 days, patient will work with PharmD and providers to maintain LDL goal < 70 Current regimen:  Atorvastatin 40mg  daily Fish Oil 1200mg  - take 2 tablets daily CoEnzyme Q10 - 100mg  daily Interventions: Discussed LDL goal  Patient self care activities - Over the next 180 days, patient will: Maintain current cholesterol medication regimen.    Atrial Fibrillation / Stroke Prevention:  Pharmacist Clinical Goal(s): Over the next 180 days, patient will work with PharmD and providers to maintain  heart rate in normal sinus rhythm and prevent stroke Current regimen:  Eliquis 5mg  twice a day Magnesium oxide 400mg  - take 2 tablets = 800mg  daily Interventions: None today  Patient self care activities - Over the next 180 days, patient will: Continue Eliquis at current dose   Pre-Diabetes Lab Results  Component Value Date/Time   HGBA1C 5.9 08/23/2021 09:19 AM   HGBA1C 6.0 04/21/2021 09:38 AM   Pharmacist Clinical Goal(s): Over the next 180 days, patient will work with PharmD and providers to maintain A1c goal <6.5% Current regimen:  Diet and exercise management   Interventions: Discussed diet and exercise Discussed a1c goal Patient self care activities - Over the next 180 days, patient will: Maintain a1c less than 6.5%  Hypothyroidism / elevated TSH in past  Lab Results  Component Value Date/Time   TSH 4.045 10/07/2021 04:07 AM   TSH 5.809 (H) 10/06/2021 12:50 PM   TSH 6.06 (H) 08/23/2021 09:19 AM   TSH 4.20 04/21/2021 09:38 AM   FREET4 0.75 10/06/2021 05:37 PM   FREET4 0.69 08/23/2021 09:19 AM   Pharmacist Clinical Goal(s) Over the next 180 days, patient will work with PharmD and providers to reduce symptoms associated with hypothyroidism Current regimen:  none Patient self care activities - Over the next 180 days, patient will: Continue to have thyroid panel checked yearly Contact provider if any symptoms of low thyroid:  Increased in weight Intolerance to cold Dry skin Increase in fatigue  Hip and Leg Pain / Osteoarthritis of hip: Pharmacist Clinical Goal(s) Over the next 180 days, patient will work with PharmD and providers  to reduce pain Current regimen:  none Patient self care activities - Over the next 180 days, patient will: Continue with exercise 3 times per week - goal of at least 150 minutes per week.  Contact Dr Larose Kells if pain worsens  Medication management Pharmacist Clinical Goal(s): Over the next 180 days, patient will work with PharmD and  providers to maintain optimal medication adherence Current pharmacy: [facility] Comprehensive medication review performed. Continue current medication management strategy Patient self care activities - Over the next 180 days, patient will: Focus on medication adherence by filling and taking medications appropriately  Take medications as prescribed Report any questions or concerns to PharmD and/or provider(s)  Patient verbalizes understanding of instructions and care plan provided today and agrees to view in [place]. Active MyChart status confirmed with patient.

## 2021-11-11 NOTE — Chronic Care Management (AMB) (Signed)
Chronic Care Management Pharmacy Note  11/11/2021 Name:  Philip [name] MRN:  [id number] DOB:  1941/03/20  Subjective: Philip [name] is an 81 y.o. year old male who is a primary patient of [name], [name], MD.  The CCM team was consulted for assistance with disease management and care coordination needs.    Engaged with patient by telephone for follow up visit in response to provider referral for pharmacy case management and/or care coordination services.   Consent to Services:  The patient was given information about Chronic Care Management services, agreed to services, and gave verbal consent prior to initiation of services.  Please see initial visit note for detailed documentation.   Patient Care Team: Colon Branch, MD as PCP - General [name], MD as PCP - Cardiology (Cardiology) Ardis Hughs, MD as Attending Physician (Urology) Angelia Mould, MD as Consulting Physician (Vascular Surgery) Rigoberto Noel, MD as Consulting Physician (Pulmonary Disease) Druscilla Brownie, MD as Consulting Physician (Dermatology) Wilford Corner, MD as Consulting Physician (Gastroenterology) Eppie Gibson, MD as Attending Physician (Radiation Oncology) Malmfelt, Stephani Police, RN as Oncology Nurse Navigator Cherre Robins, RPH-CPP (Pharmacist)  Recent office visits: 10/18/2021 - Int Med (Dr [facility]) [facility] Follow up. No medication changes noted.  08/23/21-) Int Med (Dr Larose Kells) General follow up visit. Labs ordered. Follow up in 5 months.  Recent consult visits: 06/23/21-(Neurosurgery) Dr Reatha Armour. Notes not available. But per patient they felt that patient's symptoms were circulatory. No interventions recommended.  06/02/2021 - Vascular Surgery ([name]) F/U AAA repair. Stable. F/U 1 year. No med changes.  04/07/2021 - Vein and Vascular ([place], PAC) f/u AAA and lower extremity discomfort. No med changes.   Hospital visits: 10/06/2021 to 10/08/2021 Hospital Admission at [facility] for presyncope and diaphoresis.  Noted to have elevated torpoinin /  NSTEMI. Cardiac cath done - no intervention - recommended continue ASA and statin therapy. Unable to start beta blocker due to past history of bradycardia wiht BB. Cardio consulted. recommended outpatient cardia monitoring. No medication changes noted.    Objective:  Lab Results  Component Value Date   CREATININE 0.95 10/19/2021   CREATININE 0.94 10/08/2021   CREATININE 0.80 10/07/2021    Lab Results  Component Value Date   HGBA1C 5.9 08/23/2021   Last diabetic Eye exam: No results found for: HMDIABEYEEXA  Last diabetic Foot exam: No results found for: HMDIABFOOTEX      Component Value Date/Time   CHOL 122 10/07/2021 0407   CHOL 137 09/09/2020 0842   TRIG 62 10/07/2021 0407   TRIG 256 09/14/2009 0000   HDL 45 10/07/2021 0407   HDL 49 09/09/2020 0842   CHOLHDL 2.7 10/07/2021 0407   VLDL 12 10/07/2021 0407   LDLCALC 65 10/07/2021 0407   LDLCALC 66 09/09/2020 0842   LDLDIRECT 68.0 04/13/2015 0823    Hepatic Function Latest Ref Rng & Units 10/07/2021 10/06/2021 10/06/2021  Total Protein 6.5 - 8.1 g/dL 6.2(L) 6.5 6.7  Albumin 3.5 - 5.0 g/dL 3.7 3.8 4.2  AST 15 - 41 U/L 31 31 29   ALT 0 - 44 U/L 22 23 25   Alk Phosphatase 38 - 126 U/L 58 63 60  Total Bilirubin 0.3 - 1.2 mg/dL 0.6 0.7 0.7  Bilirubin, Direct 0.0 - 0.2 mg/dL - 0.1 -    Lab Results  Component Value Date/Time   TSH 4.045 10/07/2021 04:07 AM   TSH 5.809 (H) 10/06/2021 12:50 PM   TSH 6.06 (H) 08/23/2021 09:19  AM   TSH 4.20 04/21/2021 09:38 AM   FREET4 0.75 10/06/2021 05:37 PM   FREET4 0.69 08/23/2021 09:19 AM    CBC Latest Ref Rng & Units 10/18/2021 10/08/2021 10/07/2021  WBC 4.0 - 10.5 K/uL 6.0 4.6 5.6  Hemoglobin 13.0 - 17.0 g/dL 13.8 14.1 13.8  Hematocrit 39.0 - 52.0 % 40.0 41.4 40.4  Platelets 150.0 - 400.0 K/uL 146.0(L) 127(L) 126(L)    Lab Results  Component Value Date/Time   VD25OH 58.4 09/09/2020 08:42 AM   VD25OH 83.4  03/02/2020 12:50 PM   VD25OH 41.32 04/22/2014 04:23 PM    Clinical ASCVD: Yes  The ASCVD Risk score (Arnett DK, et al., 2019) failed to calculate for the following reasons:   The 2019 ASCVD risk score is only valid for ages 107 to 22   The patient has a prior MI or stroke diagnosis    Other: CHADS2VASc = 4  Social History   Tobacco Use  Smoking Status Former   Types: Cigarettes   Quit date: 1979   Years since quitting: 44.0  Smokeless Tobacco Never  Tobacco Comments   quit smoking in 1979   BP Readings from Last 3 Encounters:  10/18/21 126/74  10/08/21 (!) 142/79  08/23/21 (!) 145/80   Pulse Readings from Last 3 Encounters:  10/18/21 74  10/08/21 62  08/23/21 65   Wt Readings from Last 3 Encounters:  10/18/21 225 lb (102.1 kg)  10/06/21 210 lb (95.3 kg)  08/23/21 220 lb 8 oz (100 kg)    Assessment: Review of patient past medical history, allergies, medications, health status, including review of consultants reports, laboratory and other test data, was performed as part of comprehensive evaluation and provision of chronic care management services.   SDOH:  (Social Determinants of Health) assessments and interventions performed:  SDOH Interventions    Flowsheet Row Most Recent Value  SDOH Interventions   Financial Strain Interventions Intervention Not Indicated  Physical Activity Interventions Intervention Not Indicated  Transportation Interventions Intervention Not Indicated       CCM Care Plan  No Known Allergies  Medications Reviewed Today     Reviewed by Cherre Robins, RPH-CPP (Pharmacist) on 11/11/21 at [phone]  Med List Status: <None>   Medication Order Taking? Sig Documenting Provider Last Dose Status Informant  amLODipine (NORVASC) 10 MG tablet [id number] Yes Take 1 tablet (10 mg total) by mouth daily. At night [name], MD Taking Active Self  Ascorbic Acid (VITAMIN C ER PO) [id number] Yes Take 500 mg by mouth daily. [provider]  Taking Active Self  atorvastatin (LIPITOR) 40 MG tablet [id number] Yes TAKE 1 TABLET (40 MG TOTAL) BY MOUTH AT BEDTIME. [name], MD Taking Active Self  B Complex-Biotin-FA (B-COMPLEX PO) [id number] Yes Take 1 tablet by mouth daily. [provider] Taking Active Self  co-enzyme Q-10 30 MG capsule [id number] Yes Take 30 mg by mouth daily. [provider] Taking Active Self  ELIQUIS 5 MG TABS tablet [id number] Yes TAKE 1 TABLET TWICE DAILY [name], MD Taking Active Self  hydrocortisone 2.5 % cream [id number] Yes Apply 1 application topically as needed (skin irritation). [provider] Taking Active Self  lisinopril (ZESTRIL) 20 MG tablet [id number] Yes Take 1 tablet (20 mg total) by mouth daily. [name], MD Taking Active Self  magnesium oxide (MAG-OX) 400 MG tablet [id number] Yes Take 800 mg by mouth daily. [provider] Taking Active Self  Multiple Vitamins-Minerals (MULTIVITAMIN WITH MINERALS) tablet [id number] Yes  Take 1 tablet by mouth daily. [provider] Taking Active Self  Omega-3 Fatty Acids (OMEGA-3 FISH OIL) 1200 MG CAPS [id number] Yes Take 2 1,200 mg capsules daily  Patient taking differently: No sig reported   End, Harrell Gave, MD Taking Active Self  polyethylene glycol (MIRALAX / GLYCOLAX) packet [id number] Yes Take 17 g by mouth daily. [provider] Taking Active Self            Patient Active Problem List   Diagnosis Date Noted   Postural dizziness with presyncope 10/06/2021   Elevated TSH 10/06/2021   CAD (coronary artery disease) 10/06/2021   AAA (abdominal aortic aneurysm) 10/06/2021   Renal mass 10/06/2021   Elevated troponin 10/06/2021   NSTEMI (non-ST elevated myocardial infarction) ([place]) 10/06/2021   Skin cancer of face 08/05/2020   Depression 04/02/2019   Lower extremity edema 12/22/2017   Coronary artery disease involving native coronary artery of native heart without angina  pectoris 12/22/2017   Other specified hypothyroidism 10/02/2017   Osteoarthritis of left hip 06/26/2017   Class 1 obesity with serious comorbidity and body mass index (BMI) of 30.0 to 30.9 in adult 05/08/2017   Hyperglycemia 04/19/2017   Vitamin D deficiency 03/29/2017   Idiopathic peripheral neuropathy 03/13/2017   Erectile dysfunction 02/22/2017   PCP NOTES >>>>>>>>>>>>>>>>>>>>>>>>>>>>>>>>>>>>>>> 12/21/2015   Chronic constipation 12/21/2015   Atrial fibrillation ([place]) 05/07/2014   Encounter for pre-operative cardiovascular clearance 05/07/2014   AAA (abdominal aortic aneurysm) without rupture 04/30/2014   Fatigue 11/25/2013   Hypomagnesemia 06/29/2011   Annual physical exam 02/16/2011   Insomnia 02/16/2011   OSA (obstructive sleep apnea)    History of skin cancer 07/24/2008   Coronary atherosclerosis 07/04/2008   Mixed hyperlipidemia 02/08/2007   Essential hypertension 02/08/2007    Immunization History  Administered Date(s) Administered   Fluad Quad(high Dose 65+) 08/19/2020, 08/23/2021   H1N1 11/06/2008   Influenza Split 09/15/2011, 09/14/2012   Influenza Whole 08/06/2010   Influenza, High Dose Seasonal PF 09/16/2015, 09/26/2016, 09/01/2017, 09/03/2018, 08/29/2019   Influenza,inj,Quad PF,6+ Mos 10/01/2013   Influenza-Unspecified 08/31/2014   PFIZER(Purple Top)SARS-COV-2 Vaccination 12/07/2019, 01/01/2020   Pneumococcal Conjugate-13 04/22/2014   Pneumococcal Polysaccharide-23 01/01/2010, 10/18/2017   Td 04/22/2014   Zoster Recombinat (Shingrix) 09/04/2018   Zoster, Live 10/31/2012    Conditions to be addressed/monitored: Atrial Fibrillation, CAD, HTN, HLD and Chronic hip and leg pain; PAD; s/p AAA repair; idiopathic periphireal neuropathy; prediabetes; hypothyroidism; chronic constipation  Care Plan : General Pharmacy (Adult)  Updates made by Cherre Robins, RPH-CPP since 11/11/2021 12:00 AM     Problem: Pre DM; HTN; hyperlipidemia, mixed; AAA; afib; neuropathy / hip  pain; chronic constipation   Priority: High  Onset Date: 02/24/2021  Note:   Current Barriers:  Chronic Disease Management support, education, and care coordination needs related to Hypertension, Hyperlipidemia/CAD, Pre-Diabetes, AFib, Hypothyroidism, Chronic Constipation, chronic pain, osteoarthritis of hip; idiopathic peripheral neuropathy.  Pharmacist Clinical Goal(s):  Over the next 180 days, patient will achieve adherence to monitoring guidelines and medication adherence to achieve therapeutic efficacy maintain control of blood pressure and lipids as evidenced by maintaining goals below  contact provider office for questions/concerns as evidenced notation of same in electronic health record Recheck TSH and restart levothyroxine if needed  through collaboration with PharmD and provider.   Interventions: 1:1 collaboration with Colon Branch, MD regarding development and update of comprehensive plan of care as evidenced by provider attestation and co-signature Inter-disciplinary care team collaboration (see longitudinal plan of care) Comprehensive medication review performed; medication  list updated in electronic medical record  Hypertension - BP goal <130/80 (due to aneurysm) Last blood pressure was at goal though a few high blood pressure reading in past; home BP readings are at goal  BP Readings from Last 3 Encounters:  10/18/21 126/74  10/08/21 (!) 142/79  08/23/21 (!) 145/80  Home BP readings per patient: 130 to 140 / 70's Exercise: goes to gym for 60 mintues 3 days per week (weights and elliptical) and golfs 1 to 2 days per week.  Current regimen:  Amlodipine 42m daily in evening Lisinopril 229mdaily in morning Interventions: Discussed blood pressure goal Continue current blood pressure medication regimen Continue to exercise regularly - goal is at least 150 minutes of moderate intensity exercise per week  Hyperlipidemia - LDL goal < 70; Tg goal <150 LDL and Triglycerides  are currently at goal  Current regimen:  Atorvastatin 4031maily Fish Oil 1200m41mtake 2 tablets daily CoEnzyme Q10 - 100mg9mly Interventions: Discussed LDL goal Maintain current cholesterol medication regimen.   Atrial Fibrillation / Stroke Prevention:  Controlled CHADSVasc = 4 Wearing outpatient heart monitor currently. Has had 1 episode of atrial fibrillation / flutter. Was asymptomatic.  Current regimen:  Eliquis 5mg t63me a day Magnesium oxide 400mg -78me 2 tablets = 800mg da73mIn 2022 screened for patient assistance for Eliquis - not eligible for 2022.  Interventions: Continue Eliquis at current dose Continue to follow up with cardiology regarding atrial flutter / fibrillation  Pre-Diabetes- A1c goal <6.5% Current controlled  Current regimen:  Diet and exercise management   Interventions: Reviewed diet and exercise Reviewed a1c goal  Hypothyroidism TSH in therapeutic range (10/07/2021) but was a little elevated at6.06 08/23/2021 Patient stopped levothyroxine 25mcg in29mch 2022 because he felt was not needed and could only get 30 day supply at a time.  Current regimen:  None Interventions: Continue to follow TSH Continue without pharmacotherapy since TSH was WNL  Hip and Leg Pain / PAD / Osteoarthritis of hip: Improved; Still has pain but is not limiting activity Medications tried - acetaminophen (not helpful); Aleve / naproxen (helped with pain but was instructed not to take due to risk of bleeding with Eliquis and oral NSAIDs); diclofenac 1% topical gel (minimal effecacy) At visit 12/30/2020 with vein and vascular; recommended trial of cilostazol. Patient took for 30 days but did not see any improvement in leg pain Has seen ortho for hip pain, tried PT and joint injections with limit efficacy. Saw neurosurgeon 06/23/2021 - felt that pain was cardiac related. No intervention recommended.  Current regimen:  none Interventions: Continue with exercise 3 times  per week - goal of at least 150 minutes per week.  Follow up with PCP is pain increases  Medication management Current pharmacy: [facility] Noted that adherence for 2022 for atorvastatin 86% and amlodipine was 81%.  Per patient [facility]. Patient endorses that he is taking all prescription medications regularly and rarely misses doses (maybe once per month)  Interventions Comprehensive medication review performed. Medication list updated Reviewed recent refill history; discussed adherence issues with patient and will follow in 2023.  Continue current medication management strategy Focus on medication adherence by filling and taking medications appropriately  Report any questions or concerns to PharmD and/or provider(s)  Patient Goals/Self-Care Activities Over the next 180 days, patient will:  take medications as prescribed, check blood pressure once per week, document,  and provide at future appointments, collaborate with provider on medication access solutions, and target a minimum of 150 minutes of moderate intensity exercise weekly  Follow Up Plan: Telephone follow up appointment with care management team member scheduled for:  6 months            Medication Assistance: Screened patient for Eliquis / [place] patient assistance 01/2021 but he did not meet income requirement for 2022. Will follow and screen if needed in 2023.   Patient's preferred pharmacy is:  [facility] [address] ([address]), [place] [address] [place] ([place])  [address] Phone: [phone] Fax: [phone]  [facility] [place] [place] [address] Phone: [phone] Fax: [phone]   Follow Up:  Patient agrees to Care Plan and Follow-up.     Telephone follow up appointment with care management team member  scheduled for:  4 to 6 months  Cherre Robins, PharmD Clinical Pharmacist [place] [facility]

## 2021-11-29 DIAGNOSIS — L819 Disorder of pigmentation, unspecified: Secondary | ICD-10-CM | POA: Diagnosis not present

## 2021-11-29 DIAGNOSIS — Z85828 Personal history of other malignant neoplasm of skin: Secondary | ICD-10-CM | POA: Diagnosis not present

## 2021-11-29 DIAGNOSIS — L218 Other seborrheic dermatitis: Secondary | ICD-10-CM | POA: Diagnosis not present

## 2021-11-29 DIAGNOSIS — Z85820 Personal history of malignant melanoma of skin: Secondary | ICD-10-CM | POA: Diagnosis not present

## 2021-11-29 DIAGNOSIS — C44222 Squamous cell carcinoma of skin of right ear and external auricular canal: Secondary | ICD-10-CM | POA: Diagnosis not present

## 2021-11-29 DIAGNOSIS — L821 Other seborrheic keratosis: Secondary | ICD-10-CM | POA: Diagnosis not present

## 2021-11-29 DIAGNOSIS — D225 Melanocytic nevi of trunk: Secondary | ICD-10-CM | POA: Diagnosis not present

## 2021-11-29 DIAGNOSIS — Z08 Encounter for follow-up examination after completed treatment for malignant neoplasm: Secondary | ICD-10-CM | POA: Diagnosis not present

## 2021-11-29 DIAGNOSIS — L814 Other melanin hyperpigmentation: Secondary | ICD-10-CM | POA: Diagnosis not present

## 2021-11-29 DIAGNOSIS — D492 Neoplasm of unspecified behavior of bone, soft tissue, and skin: Secondary | ICD-10-CM | POA: Diagnosis not present

## 2021-11-30 DIAGNOSIS — I10 Essential (primary) hypertension: Secondary | ICD-10-CM

## 2021-11-30 DIAGNOSIS — I480 Paroxysmal atrial fibrillation: Secondary | ICD-10-CM

## 2021-11-30 DIAGNOSIS — I2510 Atherosclerotic heart disease of native coronary artery without angina pectoris: Secondary | ICD-10-CM

## 2021-11-30 DIAGNOSIS — E782 Mixed hyperlipidemia: Secondary | ICD-10-CM

## 2022-01-03 DIAGNOSIS — C4442 Squamous cell carcinoma of skin of scalp and neck: Secondary | ICD-10-CM | POA: Diagnosis not present

## 2022-01-03 DIAGNOSIS — C44222 Squamous cell carcinoma of skin of right ear and external auricular canal: Secondary | ICD-10-CM | POA: Diagnosis not present

## 2022-01-03 DIAGNOSIS — C44329 Squamous cell carcinoma of skin of other parts of face: Secondary | ICD-10-CM | POA: Diagnosis not present

## 2022-01-03 DIAGNOSIS — D485 Neoplasm of uncertain behavior of skin: Secondary | ICD-10-CM | POA: Diagnosis not present

## 2022-01-05 DIAGNOSIS — D485 Neoplasm of uncertain behavior of skin: Secondary | ICD-10-CM | POA: Diagnosis not present

## 2022-01-21 ENCOUNTER — Ambulatory Visit (INDEPENDENT_AMBULATORY_CARE_PROVIDER_SITE_OTHER): Payer: Medicare HMO | Admitting: Internal Medicine

## 2022-01-21 ENCOUNTER — Encounter: Payer: Self-pay | Admitting: Internal Medicine

## 2022-01-21 VITALS — BP 138/78 | HR 68 | Temp 98.0°F | Resp 16 | Ht 69.0 in | Wt 225.2 lb

## 2022-01-21 DIAGNOSIS — I2510 Atherosclerotic heart disease of native coronary artery without angina pectoris: Secondary | ICD-10-CM | POA: Diagnosis not present

## 2022-01-21 DIAGNOSIS — I10 Essential (primary) hypertension: Secondary | ICD-10-CM

## 2022-01-21 DIAGNOSIS — R739 Hyperglycemia, unspecified: Secondary | ICD-10-CM | POA: Diagnosis not present

## 2022-01-21 DIAGNOSIS — I509 Heart failure, unspecified: Secondary | ICD-10-CM | POA: Diagnosis not present

## 2022-01-21 DIAGNOSIS — I110 Hypertensive heart disease with heart failure: Secondary | ICD-10-CM | POA: Diagnosis not present

## 2022-01-21 LAB — BASIC METABOLIC PANEL
BUN: 24 mg/dL — ABNORMAL HIGH (ref 6–23)
CO2: 31 mEq/L (ref 19–32)
Calcium: 9.6 mg/dL (ref 8.4–10.5)
Chloride: 102 mEq/L (ref 96–112)
Creatinine, Ser: 1.03 mg/dL (ref 0.40–1.50)
GFR: 68.05 mL/min (ref >60.00)
Glucose, Bld: 104 mg/dL — ABNORMAL HIGH (ref 70–99)
Potassium: 5 mEq/L (ref 3.5–5.1)
Sodium: 138 mEq/L (ref 135–145)

## 2022-01-21 LAB — HEMOGLOBIN A1C: Hgb A1c MFr Bld: 6 % (ref 4.6–6.5)

## 2022-01-21 NOTE — Patient Instructions (Signed)
Check the  blood pressure regularly ?BP GOAL is between 110/65 and  135/85. ?If it is consistently higher or lower, let me know ? ? ?GO TO THE LAB : Get the blood work   ? ? ?[place], [place] ?Come back for a physical exam in about 4 months ?

## 2022-01-21 NOTE — Progress Notes (Signed)
? ?Subjective:  ? ? Patient ID: Philip [name], male    DOB: 05-09-1941, 81 y.o.   MRN: [id number] ? ?DOS:  01/21/2022 ?Type of visit - description: f/u ? ?Since the last office visit he is doing great. ?Going to the gym, exercise 30 to 40 minutes on the elliptical machine. ?Ambulatory BP typically  better than today, less than 130/80. ? ?Review of Systems ?Denies chest pain or difficulty breathing ?No claudication ?No nausea vomiting ?No weight loss ?No gross hematuria ?  ? ?Past Medical History:  ?Diagnosis Date  ? AAA (abdominal aortic aneurysm)   ? Atrial fibrillation ([place])   ? CAD (coronary artery disease)   ? had a MI , s/p CABG  ? Cataracts, bilateral   ? immature  ? CHF (congestive heart failure) ([place])   ? Dysrhythmia   ? paroximal a fib  ? GERD (gastroesophageal reflux disease)   ? takes Omeprazole daily  ? History of colon polyps   ? History of kidney stones   ? History of shingles   ? Hyperlipidemia   ? Hypertension   ? takes Amlodipine,Metoprolol,and Lisinopril daily  ? Joint pain   ? Joint swelling   ? Myocardial infarction [facility])   ? several with last one being 2001(but never knew about any except 2001)  ? OSA (obstructive sleep apnea)   ? started CPAP 12/09  ? Peripheral vascular disease ([place])   ? Peyronie's disease   ? s/p penile implant in 2006  ? Pneumonia   ? as a child  ? Skin cancer   ? several , one of them was melanoma (sees derm routinely)  ? Tingling   ? feet occasionally  ? Tinnitus   ? ? ?Past Surgical History:  ?Procedure Laterality Date  ? ABDOMINAL AORTAGRAM N/A 06/16/2014  ? Procedure: ABDOMINAL AORTAGRAM;  Surgeon: Angelia Mould, MD;  [facility] C[facility] LAB;  Service: Cardiovascular;  Laterality: N/A;  ? ABDOMINAL AORTIC ENDOVASCULAR STENT GRAFT N/A 07/08/2014  ? Procedure: ABDOMINAL AORTIC ENDOVASCULAR STENT GRAFT/ GORE;  Surgeon: Angelia Mould, MD;  Location: [place];  Service: Vascular;  Laterality: N/A;  ? CARDIAC CATHETERIZATION  10/31/2013  ? COLONOSCOPY    ? CORONARY  ARTERY BYPASS GRAFT  10/31/1997  ? x 3 vessels  ? LEFT HEART CATH AND CORS/GRAFTS ANGIOGRAPHY N/A 10/07/2021  ? Procedure: LEFT HEART CATH AND CORS/GRAFTS ANGIOGRAPHY;  Surgeon: Burnell Blanks, MD;  Location: [place] CV LAB;  Service: Cardiovascular;  Laterality: N/A;  ? LEFT HEART CATHETERIZATION WITH CORONARY/GRAFT ANGIOGRAM N/A 05/29/2014  ? Procedure: LEFT HEART CATHETERIZATION WITH [name];  Surgeon: [name], MD;  Location: [facility] CATH LAB;  Service: Cardiovascular;  Laterality: N/A;  ? PENILE PROSTHESIS IMPLANT  08/31/2005  ? AMS inflatable penile prosthesis  ? RHINOPLASTY  10/31/1973  ? ? ?Current Outpatient Medications  ?Medication Instructions  ? amLODipine (NORVASC) 10 mg, Oral, Daily, At night  ? Ascorbic Acid (VITAMIN C ER PO) 500 mg, Oral, Daily  ? atorvastatin (LIPITOR) 40 mg, Oral, Daily at bedtime  ? B Complex-Biotin-FA (B-COMPLEX PO) 1 tablet, Daily  ? co-enzyme Q-10 30 mg, Daily  ? ELIQUIS 5 MG TABS tablet TAKE 1 TABLET TWICE DAILY  ? hydrocortisone 2.5 % cream 1 application., Topical, As needed  ? lisinopril (ZESTRIL) 20 mg, Oral, Daily  ? magnesium oxide (MAG-OX) 800 mg, Daily  ? Multiple Vitamins-Minerals (MULTIVITAMIN WITH MINERALS) tablet 1 tablet, Daily  ? Omega-3 Fish Oil 2,400 mg, Oral, Daily, Take 2 1,200  mg capsules daily  ? polyethylene glycol (MIRALAX / GLYCOLAX) 17 g, Oral, Daily  ? ? ?   ?Objective:  ? Physical Exam ?BP 138/78 (BP Location: Left Arm, Patient Position: Sitting, Cuff Size: Small)   Pulse 68   Temp 98 ?F (36.7 ?C) (Oral)   Resp 16   Ht '5\' 9"'$  (1.753 m)   Wt 225 lb 4 oz (102.2 kg)   SpO2 94%   BMI 33.26 kg/m?  ?General:   ?Well developed, NAD, BMI noted. ?HEENT:  ?Normocephalic . Face symmetric, atraumatic ?Lungs:  ?CTA B ?Normal respiratory effort, no intercostal retractions, no accessory muscle use. ?Heart: Seems regular today ?Lower extremities: no pretibial edema bilaterally  ?Skin: Not pale. Not jaundice ?Neurologic:  ?alert &  oriented X3.  ?Speech normal, gait appropriate for age and unassisted ?Psych--  ?Cognition and judgment appear intact.  ?Cooperative with normal attention span and concentration.  ?Behavior appropriate. ?No anxious or depressed appearing.  ? ?   ?Assessment   ? 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 Fib, paroxysmal  ?GERD  ?Chronic constipation ?OSA- Cpap (Dr [name]) ?ED- penile implant ?H/o skin cancer, Dr. 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  watch for uncontrolled HTN, hypokalemia  ?-Cysts pancreas: Follow-up MRI in 2 years as recommended ? ?PLAN:  ?Hyperglycemia: Checking A1c, doing great with lifestyle ?HTN BP today 138/78, at home is even better, continue lisinopril, amlodipine.  Check BMP. ?CAD: Asymptomatic. ?MRI abdomen 11/06/2021: ?See findings below, all was discussed with the patient. 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  Monitor for uncontrolled HTN, hypokalemia  ?-Cysts pancreas: Follow-up MRI in 2 years as recommended ?Preventive care: ?Shingrix and COVID booster recommended. ?RTC CPX 4 months ?  ?This visit occurred during the SARS-CoV-2 public health emergency.  Safety protocols were in place, including screening questions prior to the visit, additional usage of staff PPE, and extensive cleaning of exam room while observing appropriate contact time as indicated for disinfecting solutions.  ? ?

## 2022-01-21 NOTE — Assessment & Plan Note (Signed)
Hyperglycemia: Checking A1c, doing great with lifestyle ?HTN BP today 138/78, at home is even better, continue lisinopril, amlodipine.  Check BMP. ?CAD: Asymptomatic. ?MRI abdomen 11/06/2021: ?See findings below, all was discussed with the patient. 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  Monitor for uncontrolled HTN, hypokalemia  ?-Cysts pancreas: Follow-up MRI in 2 years as recommended ?Preventive care: ?Shingrix and COVID booster recommended. ?RTC CPX 4 months ?  ?

## 2022-01-25 ENCOUNTER — Encounter: Payer: Self-pay | Admitting: Internal Medicine

## 2022-01-25 ENCOUNTER — Ambulatory Visit (INDEPENDENT_AMBULATORY_CARE_PROVIDER_SITE_OTHER): Payer: Medicare HMO | Admitting: Internal Medicine

## 2022-01-25 VITALS — BP 152/80 | HR 82 | Temp 98.0°F | Resp 18 | Ht 69.0 in | Wt 225.2 lb

## 2022-01-25 DIAGNOSIS — U071 COVID-19: Secondary | ICD-10-CM

## 2022-01-25 LAB — POC COVID19 BINAXNOW: SARS Coronavirus 2 Ag: POSITIVE — AB

## 2022-01-25 MED ORDER — MOLNUPIRAVIR EUA 200MG CAPSULE: 4.0000 | ORAL_CAPSULE | Freq: Two times a day (BID) | ORAL | 0 refills | Status: AC

## 2022-01-25 NOTE — Progress Notes (Deleted)
?  ?Cardiology Office Note ? ? ?Date:  01/25/2022  ? ?ID:  Philip [name], DOB April 14, 1941, MRN [id number] ? ?PCP:  Colon Branch, MD  ? ? ?No chief complaint on file. ? ? ? ?Wt Readings from Last 3 Encounters:  ?01/25/22 225 lb 4 oz (102.2 kg)  ?01/21/22 225 lb 4 oz (102.2 kg)  ?10/18/21 225 lb (102.1 kg)  ?  ? ?  ?History of Present Illness: ?Philip [name] is a 81 y.o. male  with history of CAD status post CABG (1999), AAA status post endovascular repair (2015), paroxysmal atrial fibrillation, HTN, hyperlipidemia, and CKD stage III, who presents for follow-up of coronary artery disease and PAF.  Dr. Saunders Revel last saw him in February 2019, at which time he was doing well other than mild leg edema and modest weight gain.  He subsequently obtained an echocardiogram, which showed preserved LV systolic function with grade 1 diastolic dysfunction.  ?  ?Never had palpitations when he had AFib in the past.  ?  ?EVAR by Dr. 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 ?  ?His wife had orthopedic surgery in 2019.  ?  ?H/o PAF so has been on lifelong anticoagulation due to CHADS-vasc of 4.  ?  With the virus, he stopped going to the gym.  Lost 40 lbs.  Maintained weight loss for the most part. ?  ?Playing golf and working in the yard are his typical activities. ?  ?  He has received both COVID vaccines.   ?  ?BP was elevated in 2021.  Lisinopril was increased to 20 mg daily. ?  ?He has had back problems- spinal stenosis diagnosed recently.  He has some PAD, left SFA disease which is being treated conservatively for now.  Hip pain limits him the most.  Walking is limited.   ?  ?Can do stationary bike with some pain in the left leg.   ? ?Jan 2023 monitor showed: ?"? Normal sinus rhythm with one episode of Atrial fibrillationwith RVR lasting 19 minutes. ?? Bradycardia noted during sleep. ?? No pauses > 3 seconds noted. ?  ?COntinue Eliquis" ? ? ? ?Past Medical History:   ?Diagnosis Date  ? AAA (abdominal aortic aneurysm)   ? Atrial fibrillation ([place])   ? CAD (coronary artery disease)   ? had a MI , s/p CABG  ? Cataracts, bilateral   ? immature  ? CHF (congestive heart failure) ([place])   ? Dysrhythmia   ? paroximal a fib  ? GERD (gastroesophageal reflux disease)   ? takes Omeprazole daily  ? History of colon polyps   ? History of kidney stones   ? History of shingles   ? Hyperlipidemia   ? Hypertension   ? takes Amlodipine,Metoprolol,and Lisinopril daily  ? Joint pain   ? Joint swelling   ? Myocardial infarction [facility])   ? several with last one being 2001(but never knew about any except 2001)  ? OSA (obstructive sleep apnea)   ? started CPAP 12/09  ? Peripheral vascular disease ([place])   ? Peyronie's disease   ? s/p penile implant in 2006  ? Pneumonia   ? as a child  ? Skin cancer   ? several , one of them was melanoma (sees derm routinely)  ? Tingling   ? feet occasionally  ? Tinnitus   ? ? ?Past Surgical History:  ?Procedure Laterality Date  ? ABDOMINAL AORTAGRAM N/A 06/16/2014  ?  Procedure: ABDOMINAL AORTAGRAM;  Surgeon: Angelia Mould, MD;  Location: [facility] CATH LAB;  Service: Cardiovascular;  Laterality: N/A;  ? ABDOMINAL AORTIC ENDOVASCULAR STENT GRAFT N/A 07/08/2014  ? Procedure: ABDOMINAL AORTIC ENDOVASCULAR STENT GRAFT/ GORE;  Surgeon: Angelia Mould, MD;  Location: [place];  Service: Vascular;  Laterality: N/A;  ? CARDIAC CATHETERIZATION  10/31/2013  ? COLONOSCOPY    ? CORONARY ARTERY BYPASS GRAFT  10/31/1997  ? x 3 vessels  ? LEFT HEART CATH AND CORS/GRAFTS ANGIOGRAPHY N/A 10/07/2021  ? Procedure: LEFT HEART CATH AND CORS/GRAFTS ANGIOGRAPHY;  Surgeon: Burnell Blanks, MD;  Location: [place] CV LAB;  Service: Cardiovascular;  Laterality: N/A;  ? LEFT HEART CATHETERIZATION WITH CORONARY/GRAFT ANGIOGRAM N/A 05/29/2014  ? Procedure: LEFT HEART CATHETERIZATION WITH [name];  Surgeon: [name], MD;  Location: [facility] CATH LAB;  Service:  Cardiovascular;  Laterality: N/A;  ? PENILE PROSTHESIS IMPLANT  08/31/2005  ? AMS inflatable penile prosthesis  ? RHINOPLASTY  10/31/1973  ? ? ? ?Current Outpatient Medications  ?Medication Sig Dispense Refill  ? amLODipine (NORVASC) 10 MG tablet Take 1 tablet (10 mg total) by mouth daily. At night 90 tablet 1  ? Ascorbic Acid (VITAMIN C ER PO) Take 500 mg by mouth daily.    ? atorvastatin (LIPITOR) 40 MG tablet TAKE 1 TABLET (40 MG TOTAL) BY MOUTH AT BEDTIME. 90 tablet 3  ? B Complex-Biotin-FA (B-COMPLEX PO) Take 1 tablet by mouth daily.    ? co-enzyme Q-10 30 MG capsule Take 30 mg by mouth daily.    ? ELIQUIS 5 MG TABS tablet TAKE 1 TABLET TWICE DAILY 180 tablet 1  ? hydrocortisone 2.5 % cream Apply 1 application topically as needed (skin irritation).    ? lisinopril (ZESTRIL) 20 MG tablet Take 1 tablet (20 mg total) by mouth daily. 90 tablet 3  ? magnesium oxide (MAG-OX) 400 MG tablet Take 800 mg by mouth daily.    ? Multiple Vitamins-Minerals (MULTIVITAMIN WITH MINERALS) tablet Take 1 tablet by mouth daily.    ? Omega-3 Fatty Acids (OMEGA-3 FISH OIL) 1200 MG CAPS Take 2 capsules (2,400 mg total) by mouth daily. Take 2 1,200 mg capsules daily 90 capsule 3  ? polyethylene glycol (MIRALAX / GLYCOLAX) packet Take 17 g by mouth daily.    ? ?No current facility-administered medications for this visit.  ? ? ?Allergies:   Patient has no known allergies.  ? ? ?Social History:  The patient  reports that he quit smoking about 44 years ago. His smoking use included cigarettes. He has never used smokeless tobacco. He reports current alcohol use. He reports that he does not use drugs.  ? ?Family History:  The patient's ***family history includes Cancer in his father, mother, and sister; Colon cancer in an other family member; Deep vein thrombosis in his father; Heart attack in his father and mother; Heart disease in his father, mother, and sister; Hyperlipidemia in his brother, father, and mother; Hypertension in his brother,  father, and mother; Peripheral vascular disease in his mother; Prostate cancer (age of onset: 85) in his father; Prostate cancer (age of onset: 76) in his brother; Skin cancer in his brother, father, mother, and sister; Sleep apnea in his brother; Varicose Veins in his mother.  ? ? ?ROS:  Please see the history of present illness.   Otherwise, review of systems are positive for ***.   All other systems are reviewed and negative.  ? ? ?PHYSICAL EXAM: ?VS:  There were  no vitals taken for this visit. , BMI There is no height or weight on file to calculate BMI. ?GEN: Well nourished, well developed, in no acute distress ?HEENT: normal ?Neck: no JVD, carotid bruits, or masses ?Cardiac: ***RRR; no murmurs, rubs, or gallops,no edema  ?Respiratory:  clear to auscultation bilaterally, normal work of breathing ?GI: soft, nontender, nondistended, + BS ?MS: no deformity or atrophy ?Skin: warm and dry, no rash ?Neuro:  Strength and sensation are intact ?Psych: euthymic mood, full affect ? ? ?EKG:   ?The ekg ordered today demonstrates *** ? ? ?Recent Labs: ?10/06/2021: B Natriuretic Peptide 25.5 ?10/07/2021: ALT 22; Magnesium 2.3; TSH 4.045 ?10/18/2021: Hemoglobin 13.8; Platelets 146.0 ?01/21/2022: BUN 24; Creatinine, Ser 1.03; Potassium 5.0; Sodium 138  ? ?Lipid Panel ?   ?Component Value Date/Time  ? CHOL 122 10/07/2021 0407  ? CHOL 137 09/09/2020 0842  ? TRIG 62 10/07/2021 0407  ? TRIG 256 09/14/2009 0000  ? HDL 45 10/07/2021 0407  ? HDL 49 09/09/2020 0842  ? CHOLHDL 2.7 10/07/2021 0407  ? VLDL 12 10/07/2021 0407  ? [place] 65 10/07/2021 0407  ? [place] 66 09/09/2020 0842  ? LDLDIRECT 68.0 04/13/2015 0823  ? ?  ?Other studies Reviewed: ?Additional studies/ records that were reviewed today with results demonstrating: ***. ? ? ?ASSESSMENT AND PLAN: ? ?CAD: ?PAF: ?Hypertension: ?Hyperlipidemia: ?PAD: ?Prediabetes: ? ? ?Current medicines are reviewed at length with the patient today.  The patient concerns regarding his medicines were  addressed. ? ?The following changes have been made:  No change*** ? ?Labs/ tests ordered today include: *** ?No orders of the defined types were placed in this encounter. ? ? ?Recommend 150 minutes/week of aerobi

## 2022-01-25 NOTE — Progress Notes (Signed)
? ?Subjective:  ? ? Patient ID: Philip [name], male    DOB: [birth year], 81 y.o.   MRN: [id number] ? ?DOS:  01/25/2022 ?Type of visit - description: Acute ? ?Was seen few days ago he was feeling well, symptoms started 3 days ago: ?Pressure behind the eyes, cough with a small amount of sputum production with no particular color. ?He said he has low-grade fever (Tmax 98.6, "high for me"). ?Some unusual joint aches and mild malaise. ?COVID test yesterday at home: Negative ? ? ?BP Readings from Last 3 Encounters:  ?01/25/22 (!) 152/80  ?01/21/22 138/78  ?10/18/21 126/74  ? ? ? ?Review of Systems ?Denies sore throat. ?Mild nasal clear discharge ?No nausea vomiting ?No chest pain or difficulty breathing ? ?Past Medical History:  ?Diagnosis Date  ? AAA (abdominal aortic aneurysm)   ? Atrial fibrillation ([place])   ? CAD (coronary artery disease)   ? had a MI , s/p CABG  ? Cataracts, bilateral   ? immature  ? CHF (congestive heart failure) ([place])   ? Dysrhythmia   ? paroximal a fib  ? GERD (gastroesophageal reflux disease)   ? takes Omeprazole daily  ? History of colon polyps   ? History of kidney stones   ? History of shingles   ? Hyperlipidemia   ? Hypertension   ? takes Amlodipine,Metoprolol,and Lisinopril daily  ? Joint pain   ? Joint swelling   ? Myocardial infarction [facility])   ? several with last one being 2001(but never knew about any except 2001)  ? OSA (obstructive sleep apnea)   ? started CPAP 12/09  ? Peripheral vascular disease ([place])   ? Peyronie's disease   ? s/p penile implant in 2006  ? Pneumonia   ? as a child  ? Skin cancer   ? several , one of them was melanoma (sees derm routinely)  ? Tingling   ? feet occasionally  ? Tinnitus   ? ? ?Past Surgical History:  ?Procedure Laterality Date  ? ABDOMINAL AORTAGRAM N/A 06/16/2014  ? Procedure: ABDOMINAL AORTAGRAM;  Surgeon: Angelia Mould, MD;  Location: [facility] CATH LAB;  Service: Cardiovascular;  Laterality: N/A;  ? ABDOMINAL AORTIC ENDOVASCULAR STENT GRAFT N/A  07/08/2014  ? Procedure: ABDOMINAL AORTIC ENDOVASCULAR STENT GRAFT/ GORE;  Surgeon: Angelia Mould, MD;  Location: [place];  Service: Vascular;  Laterality: N/A;  ? CARDIAC CATHETERIZATION  10/31/2013  ? COLONOSCOPY    ? CORONARY ARTERY BYPASS GRAFT  10/31/1997  ? x 3 vessels  ? LEFT HEART CATH AND CORS/GRAFTS ANGIOGRAPHY N/A 10/07/2021  ? Procedure: LEFT HEART CATH AND CORS/GRAFTS ANGIOGRAPHY;  Surgeon: Burnell Blanks, MD;  Location: [place] CV LAB;  Service: Cardiovascular;  Laterality: N/A;  ? LEFT HEART CATHETERIZATION WITH CORONARY/GRAFT ANGIOGRAM N/A 05/29/2014  ? Procedure: LEFT HEART CATHETERIZATION WITH [name];  Surgeon: [name], MD;  Location: [facility] CATH LAB;  Service: Cardiovascular;  Laterality: N/A;  ? PENILE PROSTHESIS IMPLANT  08/31/2005  ? AMS inflatable penile prosthesis  ? RHINOPLASTY  10/31/1973  ? ? ?Current Outpatient Medications  ?Medication Instructions  ? amLODipine (NORVASC) 10 mg, Oral, Daily, At night  ? Ascorbic Acid (VITAMIN C ER PO) 500 mg, Oral, Daily  ? atorvastatin (LIPITOR) 40 mg, Oral, Daily at bedtime  ? B Complex-Biotin-FA (B-COMPLEX PO) 1 tablet, Daily  ? co-enzyme Q-10 30 mg, Daily  ? ELIQUIS 5 MG TABS tablet TAKE 1 TABLET TWICE DAILY  ? hydrocortisone 2.5 % cream 1 application., Topical, As  needed  ? lisinopril (ZESTRIL) 20 mg, Oral, Daily  ? magnesium oxide (MAG-OX) 800 mg, Daily  ? molnupiravir EUA (LAGEVRIO) 800 mg, Oral, 2 times daily  ? Multiple Vitamins-Minerals (MULTIVITAMIN WITH MINERALS) tablet 1 tablet, Daily  ? Omega-3 Fish Oil 2,400 mg, Oral, Daily, Take 2 1,200 mg capsules daily  ? polyethylene glycol (MIRALAX / GLYCOLAX) 17 g, Oral, Daily  ? ? ?   ?Objective:  ? Physical Exam ?BP (!) 152/80 (BP Location: Left Arm, Patient Position: Sitting, Cuff Size: Small)   Pulse 82   Temp 98 ?F (36.7 ?C) (Oral)   Resp 18   Ht '5\' 9"'$  (1.753 m)   Wt 225 lb 4 oz (102.2 kg)   SpO2 97%   BMI 33.26 kg/m?  ?General:   ?Well developed,  NAD, BMI noted. ?HEENT:  ?Normocephalic . Face symmetric, atraumatic. ?Nose: ?Slightly congested, TMs normal, throat symmetric, no red ?Lungs:  ?CTA B ?Normal respiratory effort, no intercostal retractions, no accessory muscle use. ?Heart: Regular today, no murmur.  ?Lower extremities: no pretibial edema bilaterally  ?Skin: Not pale. Not jaundice ?Neurologic:  ?alert & oriented X3.  ?Speech normal, gait appropriate for age and unassisted ?Psych--  ?Cognition and judgment appear intact.  ?Cooperative with normal attention span and concentration.  ?Behavior appropriate. ?No anxious or depressed appearing.  ? ?   ?Assessment   ? ?  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 Fib, paroxysmal  ?GERD  ?Chronic constipation ?OSA- Cpap (Dr [name]) ?ED- penile implant ?H/o skin cancer, Dr. 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  watch for uncontrolled HTN, hypokalemia  ?-Cysts pancreas: Follow-up MRI in 2 years as recommended ? ?PLAN:  ?COVID-19: ?Symptoms a started 3 days ago, COVID test negative yesterday, repeated here at the office today: Positive. ?He does not look toxic, no recent COVID-vaccine. ?Plan:  ?Rest, fluids, Tylenol, molnupiravir (results of this drug compared to Paxlovid in our community have been very favorable with less side effects). ?Unable to check oxygen levels at home ?I did recommend a COVID-vaccine in 1 month, states he would be very reluctant. ?Clear indications for seek further medical attention discussed, see AVS. ?  ? ?This visit occurred during the SARS-CoV-2 public health emergency.  Safety  protocols were in place, including screening questions prior to the visit, additional usage of staff PPE, and extensive cleaning of exam room while observing appropriate contact time as indicated for disinfecting solutions.  ? ?

## 2022-01-25 NOTE — Patient Instructions (Addendum)
? ?  Rest, fluids , tylenol ? ? ?For cough:  ?Take Mucinex DM or Robitussin-DM OTC.  Follow the instructions in the box. ? ? ?For nasal congestion: ?-Use over-the-counter Flonase: 2 nasal sprays on each side of the nose in the morning until you feel better ? ?Start taking molnupiravir for 5 days ? ? ?Call if not gradually better over the next  10 days ? ? ?Call or go to the ER if the symptoms are severe, you have high fever, short of breath, chest pain  ?

## 2022-01-26 ENCOUNTER — Ambulatory Visit: Payer: Medicare HMO | Admitting: Interventional Cardiology

## 2022-01-26 DIAGNOSIS — I10 Essential (primary) hypertension: Secondary | ICD-10-CM

## 2022-01-26 DIAGNOSIS — I25118 Atherosclerotic heart disease of native coronary artery with other forms of angina pectoris: Secondary | ICD-10-CM

## 2022-01-26 DIAGNOSIS — I739 Peripheral vascular disease, unspecified: Secondary | ICD-10-CM

## 2022-01-26 DIAGNOSIS — I480 Paroxysmal atrial fibrillation: Secondary | ICD-10-CM

## 2022-01-26 DIAGNOSIS — E782 Mixed hyperlipidemia: Secondary | ICD-10-CM

## 2022-01-26 NOTE — Assessment & Plan Note (Signed)
COVID-19: ?Symptoms a started 3 days ago, COVID test negative yesterday, repeated here at the office today: Positive. ?He does not look toxic, no recent COVID-vaccine. ?Plan:  ?Rest, fluids, Tylenol, molnupiravir (results of this drug compared to Paxlovid in our community have been very favorable with less side effects). ?Unable to check oxygen levels at home ?I did recommend a COVID-vaccine in 1 month, states he would be very reluctant. ?Clear indications for seek further medical attention discussed, see AVS. ?  ?

## 2022-01-28 ENCOUNTER — Emergency Department (HOSPITAL_COMMUNITY)
Admission: EM | Admit: 2022-01-28 | Discharge: 2022-01-28 | Disposition: A | Discharge status: Home / Self Care | Payer: Medicare HMO | Attending: Emergency Medicine | Admitting: Emergency Medicine

## 2022-01-28 ENCOUNTER — Other Ambulatory Visit: Payer: Self-pay

## 2022-01-28 ENCOUNTER — Encounter (HOSPITAL_COMMUNITY): Payer: Self-pay

## 2022-01-28 ENCOUNTER — Ambulatory Visit (INDEPENDENT_AMBULATORY_CARE_PROVIDER_SITE_OTHER): Payer: Medicare HMO | Admitting: Internal Medicine

## 2022-01-28 ENCOUNTER — Ambulatory Visit: Payer: Medicare HMO | Admitting: Nurse Practitioner

## 2022-01-28 VITALS — BP 142/78 | HR 72 | Temp 98.1°F | Resp 18 | Ht 69.0 in | Wt 222.4 lb

## 2022-01-28 DIAGNOSIS — Z7901 Long term (current) use of anticoagulants: Secondary | ICD-10-CM | POA: Insufficient documentation

## 2022-01-28 DIAGNOSIS — H05242 Constant exophthalmos, left eye: Secondary | ICD-10-CM | POA: Diagnosis not present

## 2022-01-28 DIAGNOSIS — J3489 Other specified disorders of nose and nasal sinuses: Secondary | ICD-10-CM | POA: Insufficient documentation

## 2022-01-28 DIAGNOSIS — Z79899 Other long term (current) drug therapy: Secondary | ICD-10-CM | POA: Diagnosis not present

## 2022-01-28 DIAGNOSIS — H5712 Ocular pain, left eye: Secondary | ICD-10-CM | POA: Diagnosis not present

## 2022-01-28 MED ORDER — TETRACAINE HCL 0.5 % OP SOLN
2.0000 [drp] | Freq: Once | OPHTHALMIC | Status: AC
  Administered 2022-01-28: 2 [drp] via OPHTHALMIC
  Filled 2022-01-28: qty 4

## 2022-01-28 MED ORDER — FLUORESCEIN SODIUM 1 MG OP STRP
1.0000 | ORAL_STRIP | Freq: Once | OPHTHALMIC | Status: AC
  Administered 2022-01-28: 1 via OPHTHALMIC
  Filled 2022-01-28: qty 1

## 2022-01-28 NOTE — Patient Instructions (Signed)
Please go to the [facility] emergency room ?

## 2022-01-28 NOTE — ED Provider Notes (Signed)
Shared visit.  Patient here with left thigh pain.  Sent from primary care doctor's office for this.  Patient diagnosed with COVID has had symptoms for about 3 to 4 days.  Primarily has been having nasal congestion, sinus pressure specifically behind the left eye.  He has normal vitals here.  No fever.  Visual acuity is unremarkable.  He has normal visual fields.  He has no pain or foreign body sensation over the left eye.  Discomfort at times is in the left temporal area behind the eye.  He denies any falls.  He is on Eliquis.  He is neurologically intact.  No concern for stroke.  No visual field deficit.  He is eye pressure was 21 in both eyes.  I have no concern for acute glaucoma or other eye emergency.  I have no concern for stroke or head bleed.  Overall suspect his symptoms are secondary to COVID.  He has been started on antiviral by his primary care doctor.  Discharged in good condition.  Understands return precautions. ? ?This chart was dictated using voice recognition software.  Despite best efforts to proofread,  errors can occur which can change the documentation meaning.  ?  Lennice Sites, DO ?01/28/22 1308 ? ?

## 2022-01-28 NOTE — ED Notes (Signed)
Tetracaine and Strips at bedside if needed. ?

## 2022-01-28 NOTE — ED Triage Notes (Addendum)
Patient c/o pain in his left eye x 3 days. Patient went to his PCP today and was sent to the ED today stating that the patient had pressure in his left eye. ?Patient states pain when he bends over or get up from a sitting position. ? ? ?Patient states he was told he was Covid + x 3 days. ?

## 2022-01-28 NOTE — ED Notes (Signed)
I provided reinforced discharge education based off of discharge instructions. Pt acknowledged and understood my education. Pt had no further questions/concerns for provider/myself.  °

## 2022-01-28 NOTE — Assessment & Plan Note (Signed)
Acute left eye pain with subtle exophthalmos: ?Symptoms and findings as described above in the context of recent diagnosis of COVID-19 on molnupiravir (doing well from that perspective).  ?No obvious periorbital cellulitis.  I do believe he needs immediate attention, possibly CT scanning of the area and a ophthalmology consult.  I discussed that with the ER physician who agreed to see the patient.  Appreciate his help. ?

## 2022-01-28 NOTE — Progress Notes (Signed)
? ?Subjective:  ? ? Patient ID: Philip [name], male    DOB: 1941/06/24, 81 y.o.   MRN: [id number] ? ?DOS:  01/28/2022 ?Type of visit - description: acute ? ?Patient was Dx with COVID 01/25/2022, was prescribed molnupiravir. ?The next day he developed pain at the L eye. ?The pain is worse when he bends over or gets up. ?Area is also painful when he moves his eyes left or right ?His vision -particularly at the L external field- is slightly blurred. ?He has mild discharge ?No light intolerance. ? ?As far as COVID, he is doing okay.  No fever, no chest pain, mild cough. ? ?Review of Systems ?See above  ? ?Past Medical History:  ?Diagnosis Date  ? AAA (abdominal aortic aneurysm)   ? Atrial fibrillation ([place])   ? CAD (coronary artery disease)   ? had a MI , s/p CABG  ? Cataracts, bilateral   ? immature  ? CHF (congestive heart failure) ([place])   ? Dysrhythmia   ? paroximal a fib  ? GERD (gastroesophageal reflux disease)   ? takes Omeprazole daily  ? History of colon polyps   ? History of kidney stones   ? History of shingles   ? Hyperlipidemia   ? Hypertension   ? takes Amlodipine,Metoprolol,and Lisinopril daily  ? Joint pain   ? Joint swelling   ? Myocardial infarction [facility])   ? several with last one being 2001(but never knew about any except 2001)  ? OSA (obstructive sleep apnea)   ? started CPAP 12/09  ? Peripheral vascular disease ([place])   ? Peyronie's disease   ? s/p penile implant in 2006  ? Pneumonia   ? as a child  ? Skin cancer   ? several , one of them was melanoma (sees derm routinely)  ? Tingling   ? feet occasionally  ? Tinnitus   ? ? ?Past Surgical History:  ?Procedure Laterality Date  ? ABDOMINAL AORTAGRAM N/A 06/16/2014  ? Procedure: ABDOMINAL AORTAGRAM;  Surgeon: Angelia Mould, MD;  Location: [facility] CATH LAB;  Service: Cardiovascular;  Laterality: N/A;  ? ABDOMINAL AORTIC ENDOVASCULAR STENT GRAFT N/A 07/08/2014  ? Procedure: ABDOMINAL AORTIC ENDOVASCULAR STENT GRAFT/ GORE;  Surgeon: Angelia Mould,  MD;  Location: [place];  Service: Vascular;  Laterality: N/A;  ? CARDIAC CATHETERIZATION  10/31/2013  ? COLONOSCOPY    ? CORONARY ARTERY BYPASS GRAFT  10/31/1997  ? x 3 vessels  ? LEFT HEART CATH AND CORS/GRAFTS ANGIOGRAPHY N/A 10/07/2021  ? Procedure: LEFT HEART CATH AND CORS/GRAFTS ANGIOGRAPHY;  Surgeon: Burnell Blanks, MD;  Location: [place] CV LAB;  Service: Cardiovascular;  Laterality: N/A;  ? LEFT HEART CATHETERIZATION WITH CORONARY/GRAFT ANGIOGRAM N/A 05/29/2014  ? Procedure: LEFT HEART CATHETERIZATION WITH [name];  Surgeon: [name], MD;  Location: [facility] CATH LAB;  Service: Cardiovascular;  Laterality: N/A;  ? PENILE PROSTHESIS IMPLANT  08/31/2005  ? AMS inflatable penile prosthesis  ? RHINOPLASTY  10/31/1973  ? ? ?Current Outpatient Medications  ?Medication Instructions  ? amLODipine (NORVASC) 10 mg, Oral, Daily, At night  ? Ascorbic Acid (VITAMIN C ER PO) 500 mg, Oral, Daily  ? atorvastatin (LIPITOR) 40 mg, Oral, Daily at bedtime  ? B Complex-Biotin-FA (B-COMPLEX PO) 1 tablet, Daily  ? co-enzyme Q-10 30 mg, Daily  ? ELIQUIS 5 MG TABS tablet TAKE 1 TABLET TWICE DAILY  ? hydrocortisone 2.5 % cream 1 application., Topical, As needed  ? lisinopril (ZESTRIL) 20 mg, Oral, Daily  ?  magnesium oxide (MAG-OX) 800 mg, Daily  ? molnupiravir EUA (LAGEVRIO) 800 mg, Oral, 2 times daily  ? Multiple Vitamins-Minerals (MULTIVITAMIN WITH MINERALS) tablet 1 tablet, Daily  ? Omega-3 Fish Oil 2,400 mg, Oral, Daily, Take 2 1,200 mg capsules daily  ? polyethylene glycol (MIRALAX / GLYCOLAX) 17 g, Oral, Daily  ? ? ?   ?Objective:  ? Physical Exam ?BP (!) 142/78 (BP Location: Left Arm, Patient Position: Sitting, Cuff Size: Normal)   Pulse 72   Temp 98.1 ?F (36.7 ?C) (Oral)   Resp 18   Ht '5\' 9"'$  (1.753 m)   Wt 222 lb 6.4 oz (100.9 kg)   SpO2 98%   BMI 32.84 kg/m?  ?General:   ?Well developed, NAD, BMI noted. ?HEENT:  ?Normocephalic . Face symmetric, atraumatic ?Pupils equal and reactive.  No  light intolerance ?External ocular movements are preserved. ?Conjunctivas: R normal, L not red but seems to be slightly swollen. ?There is a subtle left-sided exophthalmos. ?Periorbital area with no crepitus, redness or swelling.  ?Skin: Not pale. Not jaundice ?Neurologic:  ?alert & oriented X3.  ?Speech normal, gait appropriate for age and unassisted ?Psych--  ?Cognition and judgment appear intact.  ?Cooperative with normal attention span and concentration.  ?Behavior appropriate. ?No anxious or depressed appearing.  ? ?   ?Assessment   ? ?  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 Fib, paroxysmal  ?GERD  ?Chronic constipation ?OSA- Cpap (Dr [name]) ?ED- penile implant ?H/o skin cancer, Dr. 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  watch for uncontrolled HTN, hypokalemia  ?-Cysts pancreas: Follow-up MRI in 2 years as recommended ? ?PLAN:  ?Acute left eye pain with subtle exophthalmos: ?Symptoms and findings as described above in the context of recent diagnosis of COVID-19 on molnupiravir (doing well from that perspective).  ?No obvious periorbital cellulitis.  I do believe he needs immediate attention, possibly CT scanning of the area and a ophthalmology consult.  I discussed that with the ER physician who agreed to see the patient.  Appreciate his help. ? ?   ? ?This visit occurred during the SARS-CoV-2 public health emergency.  Safety protocols were in place, including screening questions prior to the visit, additional usage of staff PPE, and extensive cleaning of exam room while observing appropriate contact  time as indicated for disinfecting solutions.  ? ?

## 2022-01-28 NOTE — Discharge Instructions (Signed)
ShowedYou were seen today with concerns of a possible left eye issue.  Your exam no sign of acute eye issue.  This is likely sinus pressure due to your COVID-19 infection.  I recommend you continue your antivirals.  If you notice a change in visual acuity, have sudden pain in the eye, or have difficulty moving your eye, you should seek eye care immediately. ?

## 2022-01-28 NOTE — ED Provider Notes (Signed)
?[facility] DEPT ?Provider Note ? ? ?CSN: [id number] ?Arrival date & time: 01/28/22  1156 ? ?  ? ?History ? ?Chief Complaint  ?Patient presents with  ? Eye Pain  ? ? ?Philip [name] is a 81 y.o. male.  The patient presents from primary care with concern about pressure behind the left eye. The patient was diagnosed with Covid-19 and is under treatment using molnupiravir. Patient states pressure began at approximately the same time.  Patient denies vision changes. Denies headache. Patient does take Eliquis.  No relevant PMH ? ?HPI ? ?  ? ?Home Medications ?Prior to Admission medications   ?Medication Sig Start Date End Date Taking? Authorizing Provider  ?amLODipine (NORVASC) 10 MG tablet Take 1 tablet (10 mg total) by mouth daily. At night 09/27/21   [name], MD  ?Ascorbic Acid (VITAMIN C ER PO) Take 500 mg by mouth daily.    [provider]  ?atorvastatin (LIPITOR) 40 MG tablet TAKE 1 TABLET (40 MG TOTAL) BY MOUTH AT BEDTIME. 03/02/21   [name], MD  ?B Complex-Biotin-FA (B-COMPLEX PO) Take 1 tablet by mouth daily.    [provider]  ?co-enzyme Q-10 30 MG capsule Take 30 mg by mouth daily.    [provider]  ?ELIQUIS 5 MG TABS tablet TAKE 1 TABLET TWICE DAILY 06/21/21   [name], MD  ?hydrocortisone 2.5 % cream Apply 1 application topically as needed (skin irritation). 11/14/16   [provider]  ?lisinopril (ZESTRIL) 20 MG tablet Take 1 tablet (20 mg total) by mouth daily. 04/08/21   [name], MD  ?magnesium oxide (MAG-OX) 400 MG tablet Take 800 mg by mouth daily.    [provider]  ?molnupiravir EUA (LAGEVRIO) 200 mg CAPS capsule Take 4 capsules (800 mg total) by mouth 2 (two) times daily for 5 days. 01/25/22 01/30/22  Colon Branch, MD  ?Multiple Vitamins-Minerals (MULTIVITAMIN WITH MINERALS) tablet Take 1 tablet by mouth daily.    [provider]  ?Omega-3 Fatty Acids (OMEGA-3 FISH OIL) 1200  MG CAPS Take 2 capsules (2,400 mg total) by mouth daily. Take 2 1,200 mg capsules daily 11/11/21   Cherre Robins, RPH-CPP  ?polyethylene glycol (MIRALAX / GLYCOLAX) packet Take 17 g by mouth daily.    [provider]  ?   ? ?Allergies    ?Patient has no known allergies.   ? ?Review of Systems   ?Review of Systems  ?HENT:  Positive for sinus pressure (Behind left eye).   ?Eyes:  Negative for pain and visual disturbance.  ? ?Physical Exam ?Updated Vital Signs ?BP (!) 148/81 (BP Location: Right Arm)   Pulse 83   Temp 98.1 ?F (36.7 ?C) (Oral)   Resp 16   Ht '5\' 9"'$  (1.753 m)   Wt 100.9 kg   SpO2 98%   BMI 32.84 kg/m?  ?Physical Exam ?Constitutional:   ?   General: He is not in acute distress. ?HENT:  ?   Head: Normocephalic and atraumatic.  ?Eyes:  ?   General: Vision grossly intact. Gaze aligned appropriately. No visual field deficit. ?   Intraocular pressure: Right eye pressure is 21 mmHg. Left eye pressure is 21 mmHg.  ?   Extraocular Movements: Extraocular movements intact.  ?   Conjunctiva/sclera: Conjunctivae normal.  ?   Comments: Mild exophthalmos noted left eye  ?Neurological:  ?   Mental Status: He is alert.  ? ? ?ED Results / Procedures / Treatments   ?  Labs ?(all labs ordered are listed, but only abnormal results are displayed) ?Labs Reviewed - No data to display ? ?EKG ?None ? ?Radiology ?No results found. ? ?Procedures ?Procedures  ? ? ?Medications Ordered in ED ?Medications  ?tetracaine (PONTOCAINE) 0.5 % ophthalmic solution 2 drop (2 drops Left Eye Given 01/28/22 1304)  ?fluorescein ophthalmic strip 1 strip (1 strip Left Eye Given 01/28/22 1305)  ? ? ?ED Course/ Medical Decision Making/ A&P ?  ?                        ?Medical Decision Making ?Risk ?Prescription drug management. ? ?Patient complains of head pressure behind left eye.  ? ? ?Normal reactive pupils. Eye pressure measured at 21 bilaterally. No subjunctival hemorrhage. EOM intact. No visual field deficit. No headache. Visual acuity  nonremarkable. No pain or foreign body sensation over eye. Mild exophthalmos noted ? ?Pain described as pressure in head. This is likely sinus pressure from Covid. I see no acute issue involving the actual eye. The patient is safe to discharge home.  I recommend continuing the antivirals.  If he notices visual field changes, loss of visual acuity, or experiences pain in the eye itself, he should see an eye care specialist immediately. ? ? ?Final Clinical Impression(s) / ED Diagnoses ?Final diagnoses:  ?Sinus pressure  ? ? ?Rx / DC Orders ?ED Discharge Orders   ? ? None  ? ?  ? ? ?  ?Dorothyann Peng, PA-C ?01/28/22 1317 ? ?  ?Lennice Sites, DO ?01/28/22 1438 ? ?

## 2022-02-28 ENCOUNTER — Ambulatory Visit (INDEPENDENT_AMBULATORY_CARE_PROVIDER_SITE_OTHER): Payer: Medicare HMO

## 2022-02-28 VITALS — Ht 69.0 in | Wt 222.0 lb

## 2022-02-28 DIAGNOSIS — Z0000 Encounter for general adult medical examination without abnormal findings: Secondary | ICD-10-CM

## 2022-02-28 NOTE — Progress Notes (Addendum)
? ?Subjective:  ? Philip [name] is a 81 y.o. male who presents for Medicare Annual/Subsequent preventive examination. ? ?I connected with Sota today by telephone and verified that I am speaking with the correct person using two identifiers. ?Location patient: home ?Location provider: work ?Persons participating in the virtual visit: patient, nurse.  ?  ?I discussed the limitations, risks, security and privacy concerns of performing an evaluation and management service by telephone and the availability of in person appointments. I also discussed with the patient that there may be a patient responsible charge related to this service. The patient expressed understanding and verbally consented to this telephonic visit.  ?  ?Interactive audio and video telecommunications were attempted between this provider and patient, however failed, due to patient having technical difficulties OR patient did not have access to video capability.  We continued and completed visit with audio only. ? ?Some vital signs may be absent or patient reported.  ? ?Time Spent with patient on telephone encounter: 30 minutes ? ? ?Review of Systems    ? ?Cardiac Risk Factors include: advanced age (>76mn, >>65women);male gender;hypertension;dyslipidemia;obesity (BMI >30kg/m2) ? ?   ?Objective:  ?  ?Today's Vitals  ? 02/28/22 1530  ?Weight: 222 lb (100.7 kg)  ?Height: '5\' 9"'$  (1.753 m)  ? ?Body mass index is 32.78 kg/m?. ? ? ?  02/28/2022  ?  3:32 PM 01/28/2022  ? 12:09 PM 10/06/2021  ? 12:55 PM 08/05/2020  ?  3:46 PM 05/27/2020  ?  9:21 AM 05/06/2020  ? 10:00 AM 03/09/2018  ?  9:21 AM  ?Advanced Directives  ?Does Patient Have a Medical Advance Directive? Yes Yes Yes Yes Yes Yes Yes  ?Type of AParamedicof ADenisonLiving will [place]North AttleboroughLiving will Living will;Healthcare Power of AMontrose ManorLiving will  [place] will Living will;Healthcare Power of Attorney   ?Does patient want to make changes to medical advance directive?    No - Patient declined Yes (ED - send information to MyChart) No - Patient declined No - Patient declined  ?Copy of HFriantin Chart? Yes - validated most recent copy scanned in chart (See row information)  No - copy requested, Physician notified No - copy requested  Yes - validated most recent copy scanned in chart (See row information) No - copy requested  ?Would patient like information on creating a medical advance directive?   No - Patient declined      ? ? ?Current Medications (verified) ?Outpatient Encounter Medications as of 02/28/2022  ?Medication Sig  ? amLODipine (NORVASC) 10 MG tablet Take 1 tablet (10 mg total) by mouth daily. At night  ? Ascorbic Acid (VITAMIN C ER PO) Take 500 mg by mouth daily.  ? atorvastatin (LIPITOR) 40 MG tablet TAKE 1 TABLET (40 MG TOTAL) BY MOUTH AT BEDTIME.  ? B Complex-Biotin-FA (B-COMPLEX PO) Take 1 tablet by mouth daily.  ? co-enzyme Q-10 30 MG capsule Take 30 mg by mouth daily.  ? ELIQUIS 5 MG TABS tablet TAKE 1 TABLET TWICE DAILY  ? hydrocortisone 2.5 % cream Apply 1 application topically as needed (skin irritation).  ? lisinopril (ZESTRIL) 20 MG tablet Take 1 tablet (20 mg total) by mouth daily.  ? magnesium oxide (MAG-OX) 400 MG tablet Take 800 mg by mouth daily.  ? Multiple Vitamins-Minerals (MULTIVITAMIN WITH MINERALS) tablet Take 1 tablet by mouth daily.  ? Omega-3 Fatty Acids (OMEGA-3 FISH OIL) 1200 MG CAPS Take  2 capsules (2,400 mg total) by mouth daily. Take 2 1,200 mg capsules daily  ? polyethylene glycol (MIRALAX / GLYCOLAX) packet Take 17 g by mouth daily.  ? ?No facility-administered encounter medications on file as of 02/28/2022.  ? ? ?Allergies (verified) ?Patient has no known allergies.  ? ?History: ?Past Medical History:  ?Diagnosis Date  ? AAA (abdominal aortic aneurysm) ([place])   ? Atrial fibrillation ([place])   ? CAD (coronary artery disease)   ? had a MI , s/p CABG  ?  Cataracts, bilateral   ? immature  ? CHF (congestive heart failure) ([place])   ? Dysrhythmia   ? paroximal a fib  ? GERD (gastroesophageal reflux disease)   ? takes Omeprazole daily  ? History of colon polyps   ? History of kidney stones   ? History of shingles   ? Hyperlipidemia   ? Hypertension   ? takes Amlodipine,Metoprolol,and Lisinopril daily  ? Joint pain   ? Joint swelling   ? Myocardial infarction [facility])   ? several with last one being 2001(but never knew about any except 2001)  ? OSA (obstructive sleep apnea)   ? started CPAP 12/09  ? Peripheral vascular disease ([place])   ? Peyronie's disease   ? s/p penile implant in 2006  ? Pneumonia   ? as a child  ? Skin cancer   ? several , one of them was melanoma (sees derm routinely)  ? Tingling   ? feet occasionally  ? Tinnitus   ? ?Past Surgical History:  ?Procedure Laterality Date  ? ABDOMINAL AORTAGRAM N/A 06/16/2014  ? Procedure: ABDOMINAL AORTAGRAM;  Surgeon: Angelia Mould, MD;  [facility];  Service: Cardiovascular;  Laterality: N/A;  ? ABDOMINAL AORTIC ENDOVASCULAR STENT GRAFT N/A 07/08/2014  ? Procedure: ABDOMINAL AORTIC ENDOVASCULAR STENT GRAFT/ GORE;  Surgeon: Angelia Mould, MD;  Location: [place] [facility];  Service: Vascular;  Laterality: N/A;  ? CARDIAC CATHETERIZATION  10/31/2013  ? COLONOSCOPY    ? CORONARY ARTERY BYPASS GRAFT  10/31/1997  ? x 3 vessels  ? LEFT HEART CATH AND CORS/GRAFTS ANGIOGRAPHY N/A 10/07/2021  ? Procedure: LEFT HEART CATH AND CORS/GRAFTS ANGIOGRAPHY;  Surgeon: Burnell Blanks, MD;  Location: [facility] CV LAB;  Service: Cardiovascular;  Laterality: N/A;  ? LEFT HEART CATHETERIZATION WITH CORONARY/GRAFT ANGIOGRAM N/A 05/29/2014  ? Procedure: LEFT HEART CATHETERIZATION WITH [name];  Surgeon: [name], MD;  Location: [facility] CATH LAB;  Service: Cardiovascular;  Laterality: N/A;  ? PENILE PROSTHESIS IMPLANT  08/31/2005  ? AMS inflatable penile prosthesis  ? RHINOPLASTY  10/31/1973  ? ?Family  History  ?Problem Relation Age of Onset  ? Prostate cancer Father 74  ? Heart disease Father   ? Skin cancer Father   ? Heart attack Father   ? Cancer Father   ? Deep vein thrombosis Father   ? Hyperlipidemia Father   ? Hypertension Father   ? Heart disease Mother   ? Skin cancer Mother   ? Heart attack Mother   ? Cancer Mother   ? Hyperlipidemia Mother   ? Hypertension Mother   ? Varicose Veins Mother   ? Peripheral vascular disease Mother   ?     amputation  ? Sleep apnea Brother   ? Hyperlipidemia Brother   ? Hypertension Brother   ? Prostate cancer Brother 48  ? Colon cancer Other   ?     aunt  ? Skin cancer Brother   ? Skin cancer Sister   ?  Cancer Sister   ? Heart disease Sister   ? Diabetes Neg Hx   ? Stroke Neg Hx   ? ?Social History  ? ?Socioeconomic History  ? Marital status: Married  ?  Spouse name: Not on file  ? Number of children: 3  ? Years of education: 47  ? Highest education level: Not on file  ?Occupational History  ? Occupation: retired, Actor  ?Tobacco Use  ? Smoking status: Former  ?  Types: Cigarettes  ?  Quit date: 1979  ?  Years since quitting: 44.3  ? Smokeless tobacco: Never  ? Tobacco comments:  ?  quit smoking in 1979  ?Vaping Use  ? Vaping Use: Never used  ?Substance and Sexual Activity  ? Alcohol use: Yes  ?  Comment: occasionally - once per week  ? Drug use: No  ? Sexual activity: Not Currently  ?Other Topics Concern  ? Not on file  ?Social History Narrative  ? Lives w/ wife in a 2 story home.  Has one son and 2 stepchildren.  Retired for [employer].    ? Education: high school.  ? ?Social Determinants of Health  ? ?Financial Resource Strain: Low Risk   ? Difficulty of Paying Living Expenses: Not very hard  ?Food Insecurity: No Food Insecurity  ? Worried About Charity fundraiser in the Last Year: Never true  ? Ran Out of Food in the Last Year: Never true  ?Transportation Needs: No Transportation Needs  ? Lack of Transportation (Medical): No  ? Lack of Transportation (Non-Medical):  No  ?Physical Activity: Sufficiently Active  ? Days of Exercise per Week: 3 days  ? Minutes of Exercise per Session: 60 min  ?Stress: No Stress Concern Present  ? Feeling of Stress : Not at all  ?Social Connectio

## 2022-02-28 NOTE — Patient Instructions (Signed)
Mr. Law , ?Thank you for taking time to complete your Medicare Wellness Visit. I appreciate your ongoing commitment to your health goals. Please review the following plan we discussed and let me know if I can assist you in the future.  ? ?Screening recommendations/referrals: ?Colonoscopy: No longer required ?Recommended yearly ophthalmology/optometry visit for glaucoma screening and checkup ?Recommended yearly dental visit for hygiene and checkup ? ?Vaccinations: ?Influenza vaccine: Up to date ?Pneumococcal vaccine: Up to date ?Tdap vaccine: Up to date ?Shingles vaccine: Due for second dose. Please discuss with your local pharmacy.   ?Covid-19: Declined booster ? ?Advanced directives: Copy in chart ? ?Conditions/risks identified: See problem list ? ?Next appointment: Follow up in one year for your annual wellness visit.  ? ?Preventive Care 18 Years and Older, Male ?Preventive care refers to lifestyle choices and visits with your health care provider that can promote health and wellness. ?What does preventive care include? ?A yearly physical exam. This is also called an annual well check. ?Dental exams once or twice a year. ?Routine eye exams. Ask your health care provider how often you should have your eyes checked. ?Personal lifestyle choices, including: ?Daily care of your teeth and gums. ?Regular physical activity. ?Eating a healthy diet. ?Avoiding tobacco and drug use. ?Limiting alcohol use. ?Practicing safe sex. ?Taking low doses of aspirin every day. ?Taking vitamin and mineral supplements as recommended by your health care provider. ?What happens during an annual well check? ?The services and screenings done by your health care provider during your annual well check will depend on your age, overall health, lifestyle risk factors, and family history of disease. ?Counseling  ?Your health care provider may ask you questions about your: ?Alcohol use. ?Tobacco use. ?Drug use. ?Emotional well-being. ?Home and  relationship well-being. ?Sexual activity. ?Eating habits. ?History of falls. ?Memory and ability to understand (cognition). ?Work and work Statistician. ?Screening  ?You may have the following tests or measurements: ?Height, weight, and BMI. ?Blood pressure. ?Lipid and cholesterol levels. These may be checked every 5 years, or more frequently if you are over 104 years old. ?Skin check. ?Lung cancer screening. You may have this screening every year starting at age 42 if you have a 30-pack-year history of smoking and currently smoke or have quit within the past 15 years. ?Fecal occult blood test (FOBT) of the stool. You may have this test every year starting at age 61. ?Flexible sigmoidoscopy or colonoscopy. You may have a sigmoidoscopy every 5 years or a colonoscopy every 10 years starting at age 13. ?Prostate cancer screening. Recommendations will vary depending on your family history and other risks. ?Hepatitis C blood test. ?Hepatitis B blood test. ?Sexually transmitted disease (STD) testing. ?Diabetes screening. This is done by checking your blood sugar (glucose) after you have not eaten for a while (fasting). You may have this done every 1-3 years. ?Abdominal aortic aneurysm (AAA) screening. You may need this if you are a current or former smoker. ?Osteoporosis. You may be screened starting at age 60 if you are at high risk. ?Talk with your health care provider about your test results, treatment options, and if necessary, the need for more tests. ?Vaccines  ?Your health care provider may recommend certain vaccines, such as: ?Influenza vaccine. This is recommended every year. ?Tetanus, diphtheria, and acellular pertussis (Tdap, Td) vaccine. You may need a Td booster every 10 years. ?Zoster vaccine. You may need this after age 11. ?Pneumococcal 13-valent conjugate (PCV13) vaccine. One dose is recommended after age 75. ?Pneumococcal polysaccharide (  PPSV23) vaccine. One dose is recommended after age 25. ?Talk to your  health care provider about which screenings and vaccines you need and how often you need them. ?This information is not intended to replace advice given to you by your health care provider. Make sure you discuss any questions you have with your health care provider. ?Document Released: 11/13/2015 Document Revised: 07/06/2016 Document Reviewed: 08/18/2015 ?Elsevier Interactive Patient Education ? 2017 [place]. ? ?Fall Prevention in the Home ?Falls can cause injuries. They can happen to people of all ages. There are many things you can do to make your home safe and to help prevent falls. ?What can I do on the outside of my home? ?Regularly fix the edges of walkways and driveways and fix any cracks. ?Remove anything that might make you trip as you walk through a door, such as a raised step or threshold. ?Trim any bushes or trees on the path to your home. ?Use bright outdoor lighting. ?Clear any walking paths of anything that might make someone trip, such as rocks or tools. ?Regularly check to see if handrails are loose or broken. Make sure that both sides of any steps have handrails. ?Any raised decks and porches should have guardrails on the edges. ?Have any leaves, snow, or ice cleared regularly. ?Use sand or salt on walking paths during winter. ?Clean up any spills in your garage right away. This includes oil or grease spills. ?What can I do in the bathroom? ?Use night lights. ?Install grab bars by the toilet and in the tub and shower. Do not use towel bars as grab bars. ?Use non-skid mats or decals in the tub or shower. ?If you need to sit down in the shower, use a plastic, non-slip stool. ?Keep the floor dry. Clean up any water that spills on the floor as soon as it happens. ?Remove soap buildup in the tub or shower regularly. ?Attach bath mats securely with double-sided non-slip rug tape. ?Do not have throw rugs and other things on the floor that can make you trip. ?What can I do in the bedroom? ?Use night  lights. ?Make sure that you have a light by your bed that is easy to reach. ?Do not use any sheets or blankets that are too big for your bed. They should not hang down onto the floor. ?Have a firm chair that has side arms. You can use this for support while you get dressed. ?Do not have throw rugs and other things on the floor that can make you trip. ?What can I do in the kitchen? ?Clean up any spills right away. ?Avoid walking on wet floors. ?Keep items that you use a lot in easy-to-reach places. ?If you need to reach something above you, use a strong step stool that has a grab bar. ?Keep electrical cords out of the way. ?Do not use floor polish or wax that makes floors slippery. If you must use wax, use non-skid floor wax. ?Do not have throw rugs and other things on the floor that can make you trip. ?What can I do with my stairs? ?Do not leave any items on the stairs. ?Make sure that there are handrails on both sides of the stairs and use them. Fix handrails that are broken or loose. Make sure that handrails are as long as the stairways. ?Check any carpeting to make sure that it is firmly attached to the stairs. Fix any carpet that is loose or worn. ?Avoid having throw rugs at the top or  bottom of the stairs. If you do have throw rugs, attach them to the floor with carpet tape. ?Make sure that you have a light switch at the top of the stairs and the bottom of the stairs. If you do not have them, ask someone to add them for you. ?What else can I do to help prevent falls? ?Wear shoes that: ?Do not have high heels. ?Have rubber bottoms. ?Are comfortable and fit you well. ?Are closed at the toe. Do not wear sandals. ?If you use a stepladder: ?Make sure that it is fully opened. Do not climb a closed stepladder. ?Make sure that both sides of the stepladder are locked into place. ?Ask someone to hold it for you, if possible. ?Clearly mark and make sure that you can see: ?Any grab bars or handrails. ?First and last  steps. ?Where the edge of each step is. ?Use tools that help you move around (mobility aids) if they are needed. These include: ?Canes. ?Walkers. ?Scooters. ?Crutches. ?Turn on the lights when you go into a da

## 2022-03-07 NOTE — Progress Notes (Signed)
?Cardiology Office Note:   ? ?Date:  03/09/2022  ? ?ID:  BUZZ AXEL, DOB [birth year], MRN [id number] ? ?PCP:  Colon Branch, MD ?  ?[facility] Providers ?Cardiologist:  Larae Grooms, MD    ? ?Referring MD: Colon Branch, MD  ? ?Chief Complaint:  ? ?History of Present Illness:   ? ?Philip [name] is a very pleasant 81 y.o. male with a hx of AAA s/p endovascular repair 2015, CAD s/p CABG 1999,  hypertension, PAF on chronic anticoagulation, hyperlipidemia, CKD stage III, PAD with left SFA disease. ? ?EVAR by Dr. Scot [name] 07/08/2014 for which he is followed annually with CT to monitor aneurysmal hypogastric artery.  ? ?He was last seen in our office by Dr. Irish Lack on 02/09/21 at which time no changes were made and 1 year follow-up was recommended.  ? ?Admission 12/7-12/9/22 for chest pain. Was feeling well until he became profoundly diaphoretic, nauseous, weak, lightheaded following a work out.  Noted to have multiple PVCs and to be drenched in sweat per EMS, was transported to ED with improvement in symptoms with IV fluid bolus. Hs troponin increased to 931.  He underwent left heart catheterization on 10/07/2021 which revealed severe three-vessel CAD, chronic occlusion of distal left main artery and the proximal RCA, patent LIMA to LDA, patent SVG to OM, and patent SVG to PDA.  No culprit lesion is found, continue medical management of CAD.  Echo revealed normal systolic function with LVEF 55 to 25%, grade 1 diastolic dysfunction.  He was cleared for discharge by cardiology with note of not on beta blocker therapy due to past history of bradycardia.  Outpatient monitor was ordered which revealed NSR with 1 episode of A-fib RVR lasting 19 minutes, bradycardia noted during sleep, no pauses greater than 3 seconds. ? ?Today, he is here alone. Reports he is feeling well with no further episodes of presyncope, nausea, diaphoresis like he had in December. Continues to work out for 30 minutes on the elliptical 3 times per  week as well as weight machines, spends 1-2 hours at the gym 3 days per week as well as plays golf 2 times per week.  Also does yard work. Tries to eat protein with each meal, like vegetables, mostly eats at home.  ?No lightheadedness or SOB since that time except as expected with highest HR while working out. Has left leg claudication, has some PAD followed by [facility]  yearly. Home BP seldom over 852 mmHg systolic and 77O to 24M mmHg diastolic. He denies chest pain, shortness of breath, lower extremity edema, fatigue, palpitations, melena, hematuria, hemoptysis, diaphoresis, weakness, presyncope, syncope, orthopnea, and PND. ? ?Past Medical History:  ?Diagnosis Date  ? AAA (abdominal aortic aneurysm) ([place])   ? Atrial fibrillation ([place])   ? CAD (coronary artery disease)   ? had a MI , s/p CABG  ? Cataracts, bilateral   ? immature  ? CHF (congestive heart failure) ([place])   ? Dysrhythmia   ? paroximal a fib  ? GERD (gastroesophageal reflux disease)   ? takes Omeprazole daily  ? History of colon polyps   ? History of kidney stones   ? History of shingles   ? Hyperlipidemia   ? Hypertension   ? takes Amlodipine,Metoprolol,and Lisinopril daily  ? Joint pain   ? Joint swelling   ? Myocardial infarction [facility])   ? several with last one being 2001(but never knew about any except 2001)  ? OSA (obstructive sleep apnea)   ?  started CPAP 12/09  ? Peripheral vascular disease ([place])   ? Peyronie's disease   ? s/p penile implant in 2006  ? Pneumonia   ? as a child  ? Skin cancer   ? several , one of them was melanoma (sees derm routinely)  ? Tingling   ? feet occasionally  ? Tinnitus   ? ? ?Past Surgical History:  ?Procedure Laterality Date  ? ABDOMINAL AORTAGRAM N/A 06/16/2014  ? Procedure: ABDOMINAL AORTAGRAM;  Surgeon: Angelia Mould, MD;  Location: [facility] CATH LAB;  Service: Cardiovascular;  Laterality: N/A;  ? ABDOMINAL AORTIC ENDOVASCULAR STENT GRAFT N/A 07/08/2014  ? Procedure: ABDOMINAL AORTIC ENDOVASCULAR STENT GRAFT/ GORE;   Surgeon: Angelia Mould, MD;  Location: [facility];  Service: Vascular;  Laterality: N/A;  ? CARDIAC CATHETERIZATION  10/31/2013  ? COLONOSCOPY    ? CORONARY ARTERY BYPASS GRAFT  10/31/1997  ? x 3 vessels  ? LEFT HEART CATH AND CORS/GRAFTS ANGIOGRAPHY N/A 10/07/2021  ? Procedure: LEFT HEART CATH AND CORS/GRAFTS ANGIOGRAPHY;  Surgeon: Burnell Blanks, MD;  Location: [place] CV LAB;  Service: Cardiovascular;  Laterality: N/A;  ? LEFT HEART CATHETERIZATION WITH CORONARY/GRAFT ANGIOGRAM N/A 05/29/2014  ? Procedure: LEFT HEART CATHETERIZATION WITH [name];  Surgeon: [name], MD;  Location: [facility] CATH LAB;  Service: Cardiovascular;  Laterality: N/A;  ? PENILE PROSTHESIS IMPLANT  08/31/2005  ? AMS inflatable penile prosthesis  ? RHINOPLASTY  10/31/1973  ? ? ?Current Medications: ?Current Meds  ?Medication Sig  ? amLODipine (NORVASC) 10 MG tablet Take 1 tablet (10 mg total) by mouth daily. At night  ? Ascorbic Acid (VITAMIN C ER PO) Take 500 mg by mouth daily.  ? atorvastatin (LIPITOR) 40 MG tablet TAKE 1 TABLET (40 MG TOTAL) BY MOUTH AT BEDTIME.  ? B Complex-Biotin-FA (B-COMPLEX PO) Take 1 tablet by mouth daily.  ? co-enzyme Q-10 30 MG capsule Take 30 mg by mouth daily.  ? ELIQUIS 5 MG TABS tablet TAKE 1 TABLET TWICE DAILY  ? hydrocortisone 2.5 % cream Apply 1 application topically as needed (skin irritation).  ? lisinopril (ZESTRIL) 20 MG tablet Take 1 tablet (20 mg total) by mouth daily.  ? magnesium oxide (MAG-OX) 400 MG tablet Take 800 mg by mouth daily.  ? Multiple Vitamins-Minerals (MULTIVITAMIN WITH MINERALS) tablet Take 1 tablet by mouth daily.  ? Omega-3 Fatty Acids (OMEGA-3 FISH OIL) 1200 MG CAPS Take 2 capsules (2,400 mg total) by mouth daily. Take 2 1,200 mg capsules daily  ? polyethylene glycol (MIRALAX / GLYCOLAX) packet Take 17 g by mouth daily.  ?  ? ?Allergies:   Patient has no known allergies.  ? ?Social History  ? ?Socioeconomic History  ? Marital status: Married  ?   Spouse name: Not on file  ? Number of children: 3  ? Years of education: 80  ? Highest education level: Not on file  ?Occupational History  ? Occupation: retired, Actor  ?Tobacco Use  ? Smoking status: Former  ?  Types: Cigarettes  ?  Quit date: 1979  ?  Years since quitting: 44.3  ? Smokeless tobacco: Never  ? Tobacco comments:  ?  quit smoking in 1979  ?Vaping Use  ? Vaping Use: Never used  ?Substance and Sexual Activity  ? Alcohol use: Yes  ?  Comment: occasionally - once per week  ? Drug use: No  ? Sexual activity: Not Currently  ?Other Topics Concern  ? Not on file  ?Social History Narrative  ?  Lives w/ wife in a 2 story home.  Has one son and 2 stepchildren.  Retired for [employer].    ? Education: high school.  ? ?Social Determinants of Health  ? ?Financial Resource Strain: Low Risk   ? Difficulty of Paying Living Expenses: Not very hard  ?Food Insecurity: No Food Insecurity  ? Worried About Charity fundraiser in the Last Year: Never true  ? Ran Out of Food in the Last Year: Never true  ?Transportation Needs: No Transportation Needs  ? Lack of Transportation (Medical): No  ? Lack of Transportation (Non-Medical): No  ?Physical Activity: Sufficiently Active  ? Days of Exercise per Week: 3 days  ? Minutes of Exercise per Session: 60 min  ?Stress: No Stress Concern Present  ? Feeling of Stress : Not at all  ?Social Connections: Moderately Integrated  ? Frequency of Communication with Friends and Family: More than three times a week  ? Frequency of Social Gatherings with Friends and Family: More than three times a week  ? Attends Religious Services: Never  ? Active Member of Clubs or Organizations: Yes  ? Attends Archivist Meetings: More than 4 times per year  ? Marital Status: Married  ?  ? ?Family History: ?The patient's family history includes Cancer in his father, mother, and sister; Colon cancer in an other family member; Deep vein thrombosis in his father; Heart attack in his father and  mother; Heart disease in his father, mother, and sister; Hyperlipidemia in his brother, father, and mother; Hypertension in his brother, father, and mother; Peripheral vascular disease in his mother; Prostate

## 2022-03-09 ENCOUNTER — Ambulatory Visit: Payer: Medicare HMO | Admitting: Nurse Practitioner

## 2022-03-09 ENCOUNTER — Encounter: Payer: Self-pay | Admitting: Nurse Practitioner

## 2022-03-09 VITALS — BP 134/70 | HR 77 | Ht 69.0 in | Wt 225.6 lb

## 2022-03-09 DIAGNOSIS — I480 Paroxysmal atrial fibrillation: Secondary | ICD-10-CM

## 2022-03-09 DIAGNOSIS — I2510 Atherosclerotic heart disease of native coronary artery without angina pectoris: Secondary | ICD-10-CM | POA: Diagnosis not present

## 2022-03-09 DIAGNOSIS — I714 Abdominal aortic aneurysm, without rupture, unspecified: Secondary | ICD-10-CM | POA: Diagnosis not present

## 2022-03-09 DIAGNOSIS — Z7901 Long term (current) use of anticoagulants: Secondary | ICD-10-CM

## 2022-03-09 DIAGNOSIS — E785 Hyperlipidemia, unspecified: Secondary | ICD-10-CM

## 2022-03-09 DIAGNOSIS — I739 Peripheral vascular disease, unspecified: Secondary | ICD-10-CM

## 2022-03-09 NOTE — Patient Instructions (Signed)
Medication Instructions:  ? ?Your physician recommends that you continue on your current medications as directed. Please refer to the Current Medication list given to you today. ? ? ?*If you need a refill on your cardiac medications before your next appointment, please call your pharmacy* ? ? ?Lab Work: ? ?None Ordered. ? ?If you have labs (blood work) drawn today and your tests are completely normal, you will receive your results only by: ?MyChart Message (if you have MyChart) OR ?A paper copy in the mail ?If you have any lab test that is abnormal or we need to change your treatment, we will call you to review the results. ? ? ?Testing/Procedures: ? ? ? ? ?Follow-Up: ?At [facility], you and your health needs are our priority.  As part of our continuing mission to provide you with exceptional heart care, we have created designated Provider Care Teams.  These Care Teams include your primary Cardiologist (physician) and Advanced Practice Providers (APPs -  Physician Assistants and Nurse Practitioners) who all work together to provide you with the care you need, when you need it. ? ?We recommend signing up for the patient portal called "MyChart".  Sign up information is provided on this After Visit Summary.  MyChart is used to connect with patients for Virtual Visits (Telemedicine).  Patients are able to view lab/test results, encounter notes, upcoming appointments, etc.  Non-urgent messages can be sent to your provider as well.   ?To learn more about what you can do with MyChart, go to [email or web address].   ? ?Your next appointment:   ?1 month(s) ? ?The format for your next appointment:   ?In Person ? ?Provider:   ?Larae Grooms, MD   ? ? ?Other Instructions ? ?Your physician wants you to follow-up in: 1 year with Dr. Marlou Porch.  You will receive a reminder letter in the mail two months in advance. If you don't receive a letter, please call our office to schedule the follow-up appointment. ? ? ?Important  Information About Sugar ? ? ? ? ?  ?

## 2022-03-16 ENCOUNTER — Telehealth: Payer: Self-pay | Admitting: Pharmacist

## 2022-03-16 NOTE — Chronic Care Management (AMB) (Signed)
    Chronic Care Management Pharmacy Assistant   Name: Philip [name]  MRN: [id number] DOB: December 16, 1940   Reason for Encounter: Disease State Hypertension   Recent office visits:  03/31/523-Jose Ladona Horns, MD (PCP) Seen for eye pain. Patient was sent to ER. 01/25/22-Jose Ladona Horns, MD (PCP) Seen for sinusitis. Start on molnupiravir for 5 days. 01/21/22-Jose Ladona Horns, MD (PCP) Seen for follow up visit on hypertension. Follow up in 4 months.  Recent consult visits:  03/09/22-Michelle Elwyn Reach, NP (Cardiology) Follow up visit. Follow up in 1 year. 01/03/22-Mario Mitkov (Dermatology) Notes not available. 01/03/22-Daniel Saralyn Pilar (Pathology) Notes not available. 11/29/21-Omar Lyda Jester (Dermatopathology)  Notes not available. 11/29/21-Arthur Clista Bernhardt (Dermatology) Notes not available.  Hospital visits:  Admitted to the hospital on 01/28/22 due to Eye pain. Discharge date was 01/28/22. Discharged from [place]?Medications Started at [facility] Discharge:?? -started None noted  Medication Changes at Hospital Discharge: -Changed None noted  Medications Discontinued at Hospital Discharge: -Stopped None noted Medications that remain the same after Hospital Discharge:??  -All other medications will remain the same.     Admitted to the hospital on 10/06/21 due to Dizziness. Discharge date was 10/08/21. Discharged from [place]?Medications Started at [facility] Discharge:?? -started none noted  Medication Changes at Hospital Discharge: -Changed none noted  Medications Discontinued at Hospital Discharge: -Stopped none noted  Medications that remain the same after Hospital Discharge:??  -All other medications will remain the same.    Medications: Outpatient Encounter Medications as of 03/16/2022  Medication Sig   amLODipine (NORVASC) 10 MG tablet Take 1 tablet (10 mg total) by mouth daily. At night   Ascorbic Acid (VITAMIN C ER PO) Take 500 mg  by mouth daily.   atorvastatin (LIPITOR) 40 MG tablet TAKE 1 TABLET (40 MG TOTAL) BY MOUTH AT BEDTIME.   B Complex-Biotin-FA (B-COMPLEX PO) Take 1 tablet by mouth daily.   co-enzyme Q-10 30 MG capsule Take 30 mg by mouth daily.   ELIQUIS 5 MG TABS tablet TAKE 1 TABLET TWICE DAILY   hydrocortisone 2.5 % cream Apply 1 application topically as needed (skin irritation).   lisinopril (ZESTRIL) 20 MG tablet Take 1 tablet (20 mg total) by mouth daily.   magnesium oxide (MAG-OX) 400 MG tablet Take 800 mg by mouth daily.   Multiple Vitamins-Minerals (MULTIVITAMIN WITH MINERALS) tablet Take 1 tablet by mouth daily.   Omega-3 Fatty Acids (OMEGA-3 FISH OIL) 1200 MG CAPS Take 2 capsules (2,400 mg total) by mouth daily. Take 2 1,200 mg capsules daily   polyethylene glycol (MIRALAX / GLYCOLAX) packet Take 17 g by mouth daily.   No facility-administered encounter medications on file as of 03/16/2022.    Unsuccessful attempts to complete assessment call. I have called patient 3x and left 3 voicemail's for the patient to return my call when available.   Adherence Review: Is the patient currently on ACE/ARB medication? No Does the patient have >5 day gap between last estimated fill dates? No   Star Rating Drugs: Lisinopril 20 mg Last filled:02/15/22 90 DS Atorvastatin 40 mg Last filled:01/05/22 90 DS  Myriam Elta [place], [place]

## 2022-03-23 DIAGNOSIS — D225 Melanocytic nevi of trunk: Secondary | ICD-10-CM | POA: Diagnosis not present

## 2022-03-23 DIAGNOSIS — L570 Actinic keratosis: Secondary | ICD-10-CM | POA: Diagnosis not present

## 2022-03-23 DIAGNOSIS — D492 Neoplasm of unspecified behavior of bone, soft tissue, and skin: Secondary | ICD-10-CM | POA: Diagnosis not present

## 2022-03-23 DIAGNOSIS — L821 Other seborrheic keratosis: Secondary | ICD-10-CM | POA: Diagnosis not present

## 2022-03-23 DIAGNOSIS — R208 Other disturbances of skin sensation: Secondary | ICD-10-CM | POA: Diagnosis not present

## 2022-03-23 DIAGNOSIS — L814 Other melanin hyperpigmentation: Secondary | ICD-10-CM | POA: Diagnosis not present

## 2022-04-02 ENCOUNTER — Other Ambulatory Visit: Payer: Self-pay | Admitting: Interventional Cardiology

## 2022-04-02 DIAGNOSIS — I2510 Atherosclerotic heart disease of native coronary artery without angina pectoris: Secondary | ICD-10-CM

## 2022-04-02 DIAGNOSIS — R5383 Other fatigue: Secondary | ICD-10-CM

## 2022-04-02 DIAGNOSIS — R0609 Other forms of dyspnea: Secondary | ICD-10-CM

## 2022-04-11 DIAGNOSIS — C44329 Squamous cell carcinoma of skin of other parts of face: Secondary | ICD-10-CM | POA: Diagnosis not present

## 2022-04-11 DIAGNOSIS — C4442 Squamous cell carcinoma of skin of scalp and neck: Secondary | ICD-10-CM | POA: Diagnosis not present

## 2022-04-18 ENCOUNTER — Ambulatory Visit (INDEPENDENT_AMBULATORY_CARE_PROVIDER_SITE_OTHER): Payer: Medicare HMO | Admitting: Nurse Practitioner

## 2022-04-18 ENCOUNTER — Encounter: Payer: Self-pay | Admitting: Nurse Practitioner

## 2022-04-18 VITALS — BP 138/74 | HR 78 | Temp 98.1°F | Ht 69.0 in | Wt 225.4 lb

## 2022-04-18 DIAGNOSIS — G4733 Obstructive sleep apnea (adult) (pediatric): Secondary | ICD-10-CM | POA: Diagnosis not present

## 2022-04-18 DIAGNOSIS — E669 Obesity, unspecified: Secondary | ICD-10-CM | POA: Diagnosis not present

## 2022-04-18 NOTE — Assessment & Plan Note (Signed)
BMI 33.2.  He has lost around 15 pounds since we saw him last.  He is exercising regularly.  Healthy weight management discussed.

## 2022-04-18 NOTE — Patient Instructions (Addendum)
Continue to use CPAP every night, minimum of 4-6 hours a night.  Change equipment every 30 days or as directed by DME. Wash your tubing with warm soap and water daily, hang to dry. Wash humidifier portion weekly.  Be aware of reduced alertness and do not drive or operate heavy machinery if experiencing this or drowsiness.  Exercise encouraged, as tolerated. Avoid or decrease alcohol consumption and medications that make you more sleepy, if possible. Notify if persistent daytime sleepiness occurs even with consistent use of CPAP.  Follow up in one year with Dr. [name]. If symptoms do not improve or worsen, please contact office for sooner follow up or seek emergency care.

## 2022-04-18 NOTE — Progress Notes (Signed)
$'@Patient'y$  ID: Philip [name], male    DOB: Dec 13, 1940, 81 y.o.   MRN: [id number]  Chief Complaint  Patient presents with   Follow-up    No c/o    Referring provider: Colon Branch, MD  HPI: 81 year old male, former smoker followed for OSA on CPAP.  He is a patient of Dr. Bari Mantis and last seen in office on 01/08/2021.  Past medical history significant for hypertension, CAD, AAA without rupture, A-fib on Eliquis, NSTEMI, neuropathy, OA, HLD, obesity.  TEST/EVENTS:  07/2008 PSG: AHI 33/h with SPO2 low of 84% -severe OSA.  Corrected by CPAP of 10 cmH2O  04/18/2022: Today-follow-up Patient presents today for follow-up.  He has been doing well with CPAP therapy; rarely misses a night and wears it for around 7 to 8 hours.  He has not had any issues with it and feels like he wakes feeling rested.  Goes to bed between 10-11 PM, falls asleep easily and wakes around 6 AM. Denies any morning headaches, drowsy driving, narcolepsy or excessive fatigue.  He has been trying to stay active and goes to the gym 3 to 4 days a week.  He is also been able to lose around 15 pounds since we saw him last.    No concerns or complaints today.  03/16/2022-04/14/2022 Airview download CPAP 10 cmH2O 29/30 days used; 100% >4 hr; average usage 7 hours 28 minutes Leaks 95th percentile 47.8 AHI 2.5  No Known Allergies  Immunization History  Administered Date(s) Administered   Fluad Quad(high Dose 65+) 08/19/2020, 08/23/2021   H1N1 11/06/2008   Influenza Split 09/15/2011, 09/14/2012   Influenza Whole 08/06/2010   Influenza, High Dose Seasonal PF 09/16/2015, 09/26/2016, 09/01/2017, 09/03/2018, 08/29/2019   Influenza,inj,Quad PF,6+ Mos 10/01/2013   Influenza-Unspecified 08/31/2014   PFIZER(Purple Top)SARS-COV-2 Vaccination 12/07/2019, 01/01/2020   Pneumococcal Conjugate-13 04/22/2014   Pneumococcal Polysaccharide-23 01/01/2010, 10/18/2017   Td 04/22/2014   Zoster Recombinat (Shingrix) 09/04/2018, 03/04/2022   Zoster,  Live 10/31/2012    Past Medical History:  Diagnosis Date   AAA (abdominal aortic aneurysm) ([place])    Atrial fibrillation ([place])    CAD (coronary artery disease)    had a MI , s/p CABG   Cataracts, bilateral    immature   CHF (congestive heart failure) ([place])    Dysrhythmia    paroximal a fib   GERD (gastroesophageal reflux disease)    takes Omeprazole daily   History of colon polyps    History of kidney stones    History of shingles    Hyperlipidemia    Hypertension    takes Amlodipine,Metoprolol,and Lisinopril daily   Joint pain    Joint swelling    Myocardial infarction ([place])    several with last one being 2001(but never knew about any except 2001)   OSA (obstructive sleep apnea)    started CPAP 12/09   Peripheral vascular disease ([place])    Peyronie's disease    s/p penile implant in 2006   Pneumonia    as a child   Skin cancer    several , one of them was melanoma (sees derm routinely)   Tingling    feet occasionally   Tinnitus     Tobacco History: Social History   Tobacco Use  Smoking Status Former   Types: Cigarettes   Quit date: 1979   Years since quitting: 44.4  Smokeless Tobacco Never  Tobacco Comments   quit smoking in 1979   Counseling given: Not Answered Tobacco comments: quit smoking in  1979   Outpatient Medications Prior to Visit  Medication Sig Dispense Refill   amLODipine (NORVASC) 10 MG tablet Take 1 tablet (10 mg total) by mouth daily. At night 90 tablet 1   Ascorbic Acid (VITAMIN C ER PO) Take 500 mg by mouth daily.     atorvastatin (LIPITOR) 40 MG tablet TAKE 1 TABLET AT BEDTIME 90 tablet 3   B Complex-Biotin-FA (B-COMPLEX PO) Take 1 tablet by mouth daily.     co-enzyme Q-10 30 MG capsule Take 30 mg by mouth daily.     ELIQUIS 5 MG TABS tablet TAKE 1 TABLET TWICE DAILY 180 tablet 1   hydrocortisone 2.5 % cream Apply 1 application topically as needed (skin irritation).     lisinopril (ZESTRIL) 20 MG tablet Take 1 tablet (20 mg total) by  mouth daily. 90 tablet 3   magnesium oxide (MAG-OX) 400 MG tablet Take 800 mg by mouth daily.     Multiple Vitamins-Minerals (MULTIVITAMIN WITH MINERALS) tablet Take 1 tablet by mouth daily.     Omega-3 Fatty Acids (OMEGA-3 FISH OIL) 1200 MG CAPS Take 2 capsules (2,400 mg total) by mouth daily. Take 2 1,200 mg capsules daily 90 capsule 3   polyethylene glycol (MIRALAX / GLYCOLAX) packet Take 17 g by mouth daily.     No facility-administered medications prior to visit.     Review of Systems:   Constitutional: No weight loss or gain, night sweats, fevers, chills, fatigue, or lassitude. HEENT: No headaches, difficulty swallowing, tooth/dental problems, or sore throat. No sneezing, itching, ear ache, nasal congestion, or post nasal drip CV:  No chest pain, orthopnea, PND, swelling in lower extremities, anasarca, dizziness, palpitations, syncope Resp: No shortness of breath with exertion or at rest. No excess mucus or change in color of mucus. No productive or non-productive. No hemoptysis. No wheezing.  No chest wall deformity Skin: No rash, lesions, ulcerations MSK:  No joint pain or swelling.  No decreased range of motion.  No back pain. Neuro: No dizziness or lightheadedness.  Psych: No depression or anxiety. Mood stable.     Physical Exam:  BP 138/74 (BP Location: Right Arm, Patient Position: Sitting, Cuff Size: Normal)   Pulse 78   Temp 98.1 F (36.7 C) (Oral)   Ht '5\' 9"'$  (1.753 m)   Wt 225 lb 6.4 oz (102.2 kg)   SpO2 98%   BMI 33.29 kg/m   GEN: Pleasant, interactive, well-appearing; obese; in no acute distress. HEENT:  Normocephalic and atraumatic. PERRLA. Sclera white. Nasal turbinates pink, moist and patent bilaterally. No rhinorrhea present. Oropharynx pink and moist, without exudate or edema. No lesions, ulcerations, or postnasal drip.  NECK:  Supple w/ fair ROM. No JVD present. Normal carotid impulses w/o bruits. Thyroid symmetrical with no goiter or nodules palpated. No  lymphadenopathy.   CV: RRR, no m/r/g, no peripheral edema. Pulses intact, +2 bilaterally. No cyanosis, pallor or clubbing. PULMONARY:  Unlabored, regular breathing. Clear bilaterally A&P w/o wheezes/rales/rhonchi. No accessory muscle use. No dullness to percussion. GI: BS present and normoactive. Soft, non-tender to palpation. No organomegaly or masses detected. No CVA tenderness. MSK: No erythema, warmth or tenderness. Cap refil <2 sec all extrem. No deformities or joint swelling noted.  Neuro: A/Ox3. No focal deficits noted.   Skin: Warm, no lesions or rashe Psych: Normal affect and behavior. Judgement and thought content appropriate.     Lab Results:  CBC    Component Value Date/Time   WBC 6.0 10/18/2021 1533   RBC 4.31  10/18/2021 1533   HGB 13.8 10/18/2021 1533   HGB 14.8 08/14/2019 1030   HCT 40.0 10/18/2021 1533   HCT 42.5 08/14/2019 1030   PLT 146.0 (L) 10/18/2021 1533   PLT 158 08/14/2019 1030   MCV 92.7 10/18/2021 1533   MCV 94 08/14/2019 1030   MCH 31.5 10/08/2021 0118   MCHC 34.4 10/18/2021 1533   RDW 12.9 10/18/2021 1533   RDW 12.7 08/14/2019 1030   LYMPHSABS 1.8 10/18/2021 1533   LYMPHSABS 1.4 06/06/2018 0000   MONOABS 0.7 10/18/2021 1533   EOSABS 0.1 10/18/2021 1533   EOSABS 0.1 06/06/2018 0000   BASOSABS 0.0 10/18/2021 1533   BASOSABS 0.0 06/06/2018 0000    BMET    Component Value Date/Time   NA 138 01/21/2022 0908   NA 141 09/09/2020 0842   K 5.0 01/21/2022 0908   CL 102 01/21/2022 0908   CO2 31 01/21/2022 0908   GLUCOSE 104 (H) 01/21/2022 0908   BUN 24 (H) 01/21/2022 0908   BUN 15 09/09/2020 0842   CREATININE 1.03 01/21/2022 0908   CREATININE 1.08 12/14/2015 1122   CALCIUM 9.6 01/21/2022 0908   GFRNONAA >60 10/08/2021 0118   GFRAA 93 09/09/2020 0842    BNP    Component Value Date/Time   BNP 25.5 10/06/2021 1250     Imaging:  No results found.        No data to display          No results found for:  "NITRICOXIDE"      Assessment & Plan:   OSA (obstructive sleep apnea) Excellent compliance and continues to receive good benefit.  He is well controlled with AHI 2.5.  Does have some mask leaks, however these do not seem to be affecting him.  Advised him to monitor and if he starts having any increased fatigue, snoring or on his mask or other issues to let us know and we can send him for mask fitting.  Otherwise we will send orders to DME for new supplies.  Continue CPAP 10 cm of water.  Patient Instructions  Continue to use CPAP every night, minimum of 4-6 hours a night.  Change equipment every 30 days or as directed by DME. Wash your tubing with warm soap and water daily, hang to dry. Wash humidifier portion weekly.  Be aware of reduced alertness and do not drive or operate heavy machinery if experiencing this or drowsiness.  Exercise encouraged, as tolerated. Avoid or decrease alcohol consumption and medications that make you more sleepy, if possible. Notify if persistent daytime sleepiness occurs even with consistent use of CPAP.  Follow up in one year with Dr. [name]. If symptoms do not improve or worsen, please contact office for sooner follow up or seek emergency care.     Obesity (BMI 30.0-34.9) BMI 33.2.  He has lost around 15 pounds since we saw him last.  He is exercising regularly.  Healthy weight management discussed.   I spent 25 minutes of dedicated to the care of this patient on the date of this encounter to include pre-visit review of records, face-to-face time with the patient discussing conditions above, post visit ordering of testing, clinical documentation with the electronic health record, making appropriate referrals as documented, and communicating necessary findings to members of the patients care team.  Clayton Bibles, NP 04/18/2022  Pt aware and understands NP's role.

## 2022-04-18 NOTE — Assessment & Plan Note (Signed)
Excellent compliance and continues to receive good benefit.  He is well controlled with AHI 2.5.  Does have some mask leaks, however these do not seem to be affecting him.  Advised him to monitor and if he starts having any increased fatigue, snoring or on his mask or other issues to let us know and we can send him for mask fitting.  Otherwise we will send orders to DME for new supplies.  Continue CPAP 10 cm of water.  Patient Instructions  Continue to use CPAP every night, minimum of 4-6 hours a night.  Change equipment every 30 days or as directed by DME. Wash your tubing with warm soap and water daily, hang to dry. Wash humidifier portion weekly.  Be aware of reduced alertness and do not drive or operate heavy machinery if experiencing this or drowsiness.  Exercise encouraged, as tolerated. Avoid or decrease alcohol consumption and medications that make you more sleepy, if possible. Notify if persistent daytime sleepiness occurs even with consistent use of CPAP.  Follow up in one year with Dr. [name]. If symptoms do not improve or worsen, please contact office for sooner follow up or seek emergency care.

## 2022-04-27 ENCOUNTER — Encounter: Payer: Self-pay | Admitting: Internal Medicine

## 2022-04-27 ENCOUNTER — Ambulatory Visit (INDEPENDENT_AMBULATORY_CARE_PROVIDER_SITE_OTHER): Payer: Medicare HMO | Admitting: Internal Medicine

## 2022-04-27 VITALS — BP 141/68 | HR 65 | Temp 98.0°F | Resp 12 | Ht 69.0 in | Wt 220.2 lb

## 2022-04-27 DIAGNOSIS — Z0000 Encounter for general adult medical examination without abnormal findings: Secondary | ICD-10-CM

## 2022-04-27 DIAGNOSIS — I2510 Atherosclerotic heart disease of native coronary artery without angina pectoris: Secondary | ICD-10-CM | POA: Diagnosis not present

## 2022-04-27 DIAGNOSIS — E038 Other specified hypothyroidism: Secondary | ICD-10-CM | POA: Diagnosis not present

## 2022-04-27 DIAGNOSIS — R972 Elevated prostate specific antigen [PSA]: Secondary | ICD-10-CM

## 2022-04-27 DIAGNOSIS — I480 Paroxysmal atrial fibrillation: Secondary | ICD-10-CM | POA: Diagnosis not present

## 2022-04-27 DIAGNOSIS — I10 Essential (primary) hypertension: Secondary | ICD-10-CM

## 2022-04-27 DIAGNOSIS — E079 Disorder of thyroid, unspecified: Secondary | ICD-10-CM | POA: Diagnosis not present

## 2022-04-27 LAB — CBC WITH DIFFERENTIAL/PLATELET
Basophils Absolute: 0 10*3/uL (ref 0.0–0.1)
Basophils Relative: 1 % (ref 0.0–3.0)
Eosinophils Absolute: 0.1 10*3/uL (ref 0.0–0.7)
Eosinophils Relative: 1.6 % (ref 0.0–5.0)
HCT: 41.6 % (ref 39.0–52.0)
Hemoglobin: 14.2 g/dL (ref 13.0–17.0)
Lymphocytes Relative: 39.6 % (ref 12.0–46.0)
Lymphs Abs: 1.5 10*3/uL (ref 0.7–4.0)
MCHC: 34.1 g/dL (ref 30.0–36.0)
MCV: 94.2 fl (ref 78.0–100.0)
Monocytes Absolute: 0.5 10*3/uL (ref 0.1–1.0)
Monocytes Relative: 13 % — ABNORMAL HIGH (ref 3.0–12.0)
Neutro Abs: 1.7 10*3/uL (ref 1.4–7.7)
Neutrophils Relative %: 44.8 % (ref 43.0–77.0)
Platelets: 140 10*3/uL — ABNORMAL LOW (ref 150.0–400.0)
RBC: 4.42 Mil/uL (ref 4.22–5.81)
RDW: 13.5 % (ref 11.5–15.5)
WBC: 3.8 10*3/uL — ABNORMAL LOW (ref 4.0–10.5)

## 2022-04-27 LAB — BASIC METABOLIC PANEL
BUN: 26 mg/dL — ABNORMAL HIGH (ref 6–23)
CO2: 30 mEq/L (ref 19–32)
Calcium: 10 mg/dL (ref 8.4–10.5)
Chloride: 100 mEq/L (ref 96–112)
Creatinine, Ser: 1.13 mg/dL (ref 0.40–1.50)
GFR: 60.77 mL/min (ref >60.00)
Glucose, Bld: 102 mg/dL — ABNORMAL HIGH (ref 70–99)
Potassium: 4.9 mEq/L (ref 3.5–5.1)
Sodium: 137 mEq/L (ref 135–145)

## 2022-04-27 LAB — LIPID PANEL
Cholesterol: 132 mg/dL (ref 0–200)
HDL: 51.2 mg/dL (ref >39.00)
LDL Cholesterol: 57 mg/dL (ref 0–99)
NonHDL: 80.54
Total CHOL/HDL Ratio: 3
Triglycerides: 119 mg/dL (ref 0.0–149.0)
VLDL: 23.8 mg/dL (ref 0.0–40.0)

## 2022-04-27 LAB — TSH: TSH: 5.08 u[IU]/mL (ref 0.35–5.50)

## 2022-04-27 LAB — PSA: PSA: 6.2 ng/mL — ABNORMAL HIGH (ref 0.10–4.00)

## 2022-04-27 LAB — T4, FREE: Free T4: 0.76 ng/dL (ref 0.60–1.60)

## 2022-04-27 NOTE — Assessment & Plan Note (Signed)
-  [id number] - pnm 23: 2018;  prevnar :2015  - s/p shingrix  - -s/p zostavax ( [employer])  -  Covid  vax booster recommended - Yearly flu shot recommended -CCS: C-scope 2007 aprox. polyps x 6 ,  03-2009, hyperplastic polyps. 09-2014: polyps, cscope 08/2019, was told no further colonoscopies, no documentation. Prostate cancer screening:  +FH, saw urology, last note 07-2020 for increased PSA, slt abnormal DRE, MRI prostate: No radiographic evidence of malignancy.  Currently with no symptoms, DRE showed right lobe to be slightly more firm, not a new issue.  Monitor PSA. -Labs: TFTs  PSA FLP BMP CBC -Diet and exercise: Counseled, doing well, goes to gym 3 times a week. -POA: On file

## 2022-04-27 NOTE — Assessment & Plan Note (Signed)
Here for CPX Hyperglycemia: Last A1c is stable. HTN: Ambulatory BPs 130/70, BP today 141/68.  Checking labs, recommend to continue Amlodipine and lisinopril. CAD, atrial fibrillation, Saw cardiology 03/09/2022, not on BB due to bradycardia.  Anticoagulated.  Felt to be stable. OSA: Saw pulmonary, stable, on CPAP Acute left eye pain: See last visit, went to the ER, eye exam was negative. Subclinical hypothyroidism: Check TFTs RTC 6 months

## 2022-04-27 NOTE — Progress Notes (Signed)
Subjective:    Patient ID: Philip [name], male    DOB: Aug 07, 1941, 81 y.o.   MRN: [id number]  DOS:  04/27/2022 Type of visit - description: cpx  Since the last office visit is doing well and has no major concerns. Specifically denies chest pain or difficulty breathing. No LUTS. Ambulatory BPs are very good  BP Readings from Last 3 Encounters:  04/27/22 (!) 141/68  04/18/22 138/74  03/09/22 134/70   Review of Systems  Other than above, a 14 point review of systems is negative    Past Medical History:  Diagnosis Date   AAA (abdominal aortic aneurysm) ([place])    Atrial fibrillation ([place])    CAD (coronary artery disease)    had a MI , s/p CABG   Cataracts, bilateral    immature   CHF (congestive heart failure) ([place])    Dysrhythmia    paroximal a fib   GERD (gastroesophageal reflux disease)    takes Omeprazole daily   History of colon polyps    History of kidney stones    History of shingles    Hyperlipidemia    Hypertension    takes Amlodipine,Metoprolol,and Lisinopril daily   Joint pain    Joint swelling    Myocardial infarction ([place])    several with last one being 2001(but never knew about any except 2001)   OSA (obstructive sleep apnea)    started CPAP 12/09   Peripheral vascular disease ([place])    Peyronie's disease    s/p penile implant in 2006   Pneumonia    as a child   Skin cancer    several , one of them was melanoma (sees derm routinely)   Tingling    feet occasionally   Tinnitus     Past Surgical History:  Procedure Laterality Date   ABDOMINAL AORTAGRAM N/A 06/16/2014   Procedure: ABDOMINAL Maxcine Ham;  Surgeon: Angelia Mould, MD;  Location: [facility] CATH LAB;  Service: Cardiovascular;  Laterality: N/A;   ABDOMINAL AORTIC ENDOVASCULAR STENT GRAFT N/A 07/08/2014   Procedure: ABDOMINAL AORTIC ENDOVASCULAR STENT GRAFT/ GORE;  Surgeon: Angelia Mould, MD;  Location: [facility];  Service: Vascular;  Laterality: N/A;   CARDIAC CATHETERIZATION   10/31/2013   COLONOSCOPY     CORONARY ARTERY BYPASS GRAFT  10/31/1997   x 3 vessels   LEFT HEART CATH AND CORS/GRAFTS ANGIOGRAPHY N/A 10/07/2021   Procedure: LEFT HEART CATH AND CORS/GRAFTS ANGIOGRAPHY;  Surgeon: Burnell Blanks, MD;  Location: [facility] CV LAB;  Service: Cardiovascular;  Laterality: N/A;   LEFT HEART CATHETERIZATION WITH CORONARY/GRAFT ANGIOGRAM N/A 05/29/2014   Procedure: LEFT HEART CATHETERIZATION WITH [name];  Surgeon: [name], MD;  Location[facility] C[facility] LAB;  Service: Cardiovascular;  Laterality: N/A;   PENILE PROSTHESIS IMPLANT  08/31/2005   AMS inflatable penile prosthesis   RHINOPLASTY  10/31/1973   Social History   Socioeconomic History   Marital status: Married    Spouse name: Not on file   Number of children: 3   Years of education: 12   Highest education level: Not on file  Occupational History   Occupation: retired, Actor  Tobacco Use   Smoking status: Former    Types: Cigarettes    Quit date: 1979    Years since quitting: 44.5   Smokeless tobacco: Never   Tobacco comments:    quit smoking in [place] Use   Vaping Use: Never used  Substance and Sexual Activity   Alcohol use:  Yes    Comment: occasionally - once per week   Drug use: No   Sexual activity: Not Currently  Other Topics Concern   Not on file  Social History Narrative   Lives w/ wife in a 2 story home.  Has one son and 2 stepchildren.  Retired for [employer].     Education: high school.   Social Determinants of Health   Financial Resource Strain: Low Risk  (11/11/2021)   Overall Financial Resource Strain (CARDIA)    Difficulty of Paying Living Expenses: Not very hard  Food Insecurity: No Food Insecurity (06/18/2021)   Hunger Vital Sign    Worried About Running Out of Food in the Last Year: Never true    Ran Out of Food in the Last Year: Never true  Transportation Needs: No Transportation Needs (11/11/2021)   PRAPARE - Armed forces logistics/support/administrative officer (Medical): No    Lack of Transportation (Non-Medical): No  Physical Activity: Sufficiently Active (11/11/2021)   Exercise Vital Sign    Days of Exercise per Week: 3 days    Minutes of Exercise per Session: 60 min  Stress: No Stress Concern Present (02/28/2022)   [place]    Feeling of Stress : Not at all  Social Connections: Moderately Integrated (02/28/2022)   Social Connection and Isolation Panel [NHANES]    Frequency of Communication with Friends and Family: More than three times a week    Frequency of Social Gatherings with Friends and Family: More than three times a week    Attends Religious Services: Never    Marine scientist or Organizations: Yes    Attends Music therapist: More than 4 times per year    Marital Status: Married  Human resources officer Violence: Not At Risk (02/28/2022)   Humiliation, Afraid, Rape, and Kick questionnaire    Fear of Current or Ex-Partner: No    Emotionally Abused: No    Physically Abused: No    Sexually Abused: No     Current Outpatient Medications  Medication Instructions   amLODipine (NORVASC) 10 mg, Oral, Daily, At night   Ascorbic Acid (VITAMIN C ER PO) 500 mg, Oral, Daily   atorvastatin (LIPITOR) 40 MG tablet TAKE 1 TABLET AT BEDTIME   B Complex-Biotin-FA (B-COMPLEX PO) 1 tablet, Daily   co-enzyme Q-10 30 mg, Daily   ELIQUIS 5 MG TABS tablet TAKE 1 TABLET TWICE DAILY   hydrocortisone 2.5 % cream 1 application , Topical, As needed   lisinopril (ZESTRIL) 20 mg, Oral, Daily   magnesium oxide (MAG-OX) 800 mg, Daily   Multiple Vitamins-Minerals (MULTIVITAMIN WITH MINERALS) tablet 1 tablet, Daily   Omega-3 Fish Oil 2,400 mg, Oral, Daily, Take 2 1,200 mg capsules daily   polyethylene glycol (MIRALAX / GLYCOLAX) 17 g, Oral, Daily       Objective:   Physical Exam BP (!) 141/68 (BP Location: Left Arm, Cuff Size: Large)   Pulse 65   Temp 98  F (36.7 C) (Oral)   Resp 12   Ht '5\' 9"'$  (1.753 m)   Wt 220 lb 3.2 oz (99.9 kg)   SpO2 98%   BMI 32.52 kg/m  General: Well developed, NAD, BMI noted Neck: No  thyromegaly  HEENT:  Normocephalic . Face symmetric, atraumatic Lungs:  CTA B Normal respiratory effort, no intercostal retractions, no accessory muscle use. Heart: Seems regular.  Abdomen:  Not distended, soft, non-tender. No rebound or rigidity.   Lower  extremities: no pretibial edema bilaterally DRE: Normal sphincter tone, brown stools.  Prostate normal.  Question of very mild induration on the R lobe Skin: Exposed areas without rash. Not pale. Not jaundice Neurologic:  alert & oriented X3.  Speech normal, gait appropriate for age and unassisted Strength symmetric and appropriate for age.  Psych: Cognition and judgment appear intact.  Cooperative with normal attention span and concentration.  Behavior appropriate. No anxious or depressed appearing.     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 Fib, paroxysmal  GERD  Chronic constipation OSA- Cpap (Dr [name]) ED- penile implant H/o skin cancer, Dr. 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  watch for uncontrolled HTN, hypokalemia  -Cysts pancreas: Follow-up MRI in 2 years as recommended  PLAN: Here for CPX Hyperglycemia: Last A1c is stable. HTN: Ambulatory BPs 130/70, BP today 141/68.  Checking labs, recommend to continue Amlodipine and lisinopril. CAD, atrial fibrillation, Saw cardiology 03/09/2022, not on BB due to bradycardia.  Anticoagulated.  Felt  to be stable. OSA: Saw pulmonary, stable, on CPAP Acute left eye pain: See last visit, went to the ER, eye exam was negative. Subclinical hypothyroidism: Check TFTs RTC 6 months

## 2022-04-27 NOTE — Patient Instructions (Signed)
Please consider getting the COVID-vaccine booster and flu shot this fall   Check the  blood pressure regularly BP GOAL is between 110/65 and  135/85. If it is consistently higher or lower, let me know      GO TO THE LAB : Get the blood work     Philip [name], [place] back for a checkup in 6 months

## 2022-05-02 ENCOUNTER — Other Ambulatory Visit: Payer: Self-pay | Admitting: Interventional Cardiology

## 2022-05-02 DIAGNOSIS — I2510 Atherosclerotic heart disease of native coronary artery without angina pectoris: Secondary | ICD-10-CM

## 2022-05-02 NOTE — Telephone Encounter (Signed)
Prescription refill request for Eliquis received. Indication: PAF Last office visit: 03/09/22  Elwyn Reach NP Scr: 1.13 on 04/27/22 Age: 81 Weight: 102.3  Based on above findings Eliquis '5mg'$  twice daily is the appropriate dose.  Refill approved.

## 2022-05-12 ENCOUNTER — Telehealth: Payer: Medicare HMO

## 2022-05-12 ENCOUNTER — Ambulatory Visit (INDEPENDENT_AMBULATORY_CARE_PROVIDER_SITE_OTHER): Payer: Medicare HMO | Admitting: Pharmacist

## 2022-05-12 DIAGNOSIS — I10 Essential (primary) hypertension: Secondary | ICD-10-CM

## 2022-05-12 DIAGNOSIS — I2510 Atherosclerotic heart disease of native coronary artery without angina pectoris: Secondary | ICD-10-CM

## 2022-05-12 DIAGNOSIS — I480 Paroxysmal atrial fibrillation: Secondary | ICD-10-CM

## 2022-05-12 DIAGNOSIS — E038 Other specified hypothyroidism: Secondary | ICD-10-CM

## 2022-05-12 NOTE — Patient Instructions (Signed)
Philip [name]  It was a pleasure speaking with you  Below is a summary of your health goals. You can find an updated Chronic Care Management care plan in [place].   Patient Goals/Self-Care Activities take medications as prescribed,  check blood pressure once per week, document, and provide at future appointments,  collaborate with provider on medication access solutions, and  target a minimum of 150 minutes of moderate intensity exercise weekly    If you have any questions or concerns, please feel free to contact me either at the phone number below or with a MyChart message.   Keep up the good work!  Cherre Robins, PharmD Clinical Pharmacist [facility] [phone] (direct line)  [phone] (main office number)   Patient verbalizes understanding of instructions and care plan provided today and agrees to view in [place]. Active MyChart status and patient understanding of how to access instructions and care plan via MyChart confirmed with patient.

## 2022-05-12 NOTE — Chronic Care Management (AMB) (Signed)
Chronic Care Management Pharmacy Note  05/12/2022 Name:  Philip [name] MRN:  [id number] DOB:  [birth year]  Summary:  Patient is due to have lisinopril filled. States he has 18 to 20 days left. He will request form [facility] next week.  Blood pressure at home usually SBP 130 to 140's and DBP 60 to 70's. Checking blood pressure about once per week.  Denies dizziness. Continue current regimen. Reviewed blood pressure goal.  Patient is in Medicare coverage gap but does not qualify for medication assistance program due to income limit.  Exercises regularly - continue   Subjective: Philip [name] is an 81 y.o. year old male who is a primary patient of [name], [name], MD.  The CCM team was consulted for assistance with disease management and care coordination needs.    Engaged with patient by telephone for follow up visit in response to provider referral for pharmacy case management and/or care coordination services.   Consent to Services:  The patient was given information about Chronic Care Management services, agreed to services, and gave verbal consent prior to initiation of services.  Please see initial visit note for detailed documentation.   Patient Care Team: Colon Branch, MD as PCP - General [name], MD as PCP - Cardiology (Cardiology) Ardis Hughs, MD as Attending Physician (Urology) Angelia Mould, MD as Consulting Physician (Vascular Surgery) Rigoberto Noel, MD as Consulting Physician (Pulmonary Disease) Druscilla Brownie, MD as Consulting Physician (Dermatology) Wilford Corner, MD as Consulting Physician (Gastroenterology) Eppie Gibson, MD as Attending Physician (Radiation Oncology) Malmfelt, Stephani Police, RN as Oncology Nurse Navigator Cherre Robins, RPH-CPP (Pharmacist)  Recent office visits: 04/27/2022 - Int Med (Dr Larose Kells) Annual physical exam. Office BP slightly elevated but ambulatory BPs good. Labs checked. No med changes noted. PSA slightly  elelvated. Plans to recheck in 6 months.  10/18/2021 - Int Med (Dr Larose Kells) [facility] Follow up. No medication changes noted.  08/23/21-) Int Med (Dr Larose Kells) General follow up visit. Labs ordered. Follow up in 5 months.  Recent consult visits: 04/18/2022 - Pulm (Cobb, NP) Seen for OSA. No med changes noted. Referred for DME for updated CPAP supplies.  03/23/2022 - Dermatology (Dr Pearline Cables) melanocytic nevi of trunk. Actinic keratosis. Full office notes not available.  06/23/21-(Neurosurgery) Dr Reatha Armour. Notes not available. But per patient they felt that patient's symptoms were circulatory. No interventions recommended.  06/02/2021 - Vascular Surgery ([name]) F/U AAA repair. Stable. F/U 1 year. No med changes.  04/07/2021 - Vein and Vascular ([place]and, PAC) f/u AAA and lower extremity discomfort. No med changes.   Hospital visits: None in last 6 months.   Objective:  Lab Results  Component Value Date   CREATININE 1.13 04/27/2022   CREATININE 1.03 01/21/2022   CREATININE 0.95 10/19/2021    Lab Results  Component Value Date   HGBA1C 6.0 01/21/2022   Last diabetic Eye exam: No results found for: "HMDIABEYEEXA"  Last diabetic Foot exam: No results found for: "HMDIABFOOTEX"      Component Value Date/Time   CHOL 132 04/27/2022 0939   CHOL 137 09/09/2020 0842   TRIG 119.0 04/27/2022 0939   TRIG 256 09/14/2009 0000   HDL 51.20 04/27/2022 0939   HDL 49 09/09/2020 0842   CHOLHDL 3 04/27/2022 0939   VLDL 23.8 04/27/2022 0939   LDLCALC 57 04/27/2022 0939   LDLCALC 66 09/09/2020 0842   LDLDIRECT 68.0 04/13/2015 0823       Latest Ref Rng & Units 10/07/2021  4:07 AM 10/06/2021    8:17 PM 10/06/2021   12:50 PM  Hepatic Function  Total Protein 6.5 - 8.1 g/dL 6.2  6.5  6.7   Albumin 3.5 - 5.0 g/dL 3.7  3.8  4.2   AST 15 - 41 U/L 31  31  29    ALT 0 - 44 U/L 22  23  25    Alk Phosphatase 38 - 126 U/L 58  63  60   Total Bilirubin 0.3 - 1.2 mg/dL 0.6  0.7  0.7   Bilirubin, Direct 0.0 - 0.2  mg/dL  0.1      Lab Results  Component Value Date/Time   TSH 5.08 04/27/2022 09:39 AM   TSH 4.045 10/07/2021 04:07 AM   TSH 5.809 (H) 10/06/2021 12:50 PM   TSH 6.06 (H) 08/23/2021 09:19 AM   FREET4 0.76 04/27/2022 09:39 AM   FREET4 0.75 10/06/2021 05:37 PM       Latest Ref Rng & Units 04/27/2022    9:39 AM 10/18/2021    3:33 PM 10/08/2021    1:18 AM  CBC  WBC 4.0 - 10.5 K/uL 3.8  6.0  4.6   Hemoglobin 13.0 - 17.0 g/dL 14.2  13.8  14.1   Hematocrit 39.0 - 52.0 % 41.6  40.0  41.4   Platelets 150.0 - 400.0 K/uL 140.0  146.0  127     Lab Results  Component Value Date/Time   VD25OH 58.4 09/09/2020 08:42 AM   VD25OH 83.4 03/02/2020 12:50 PM   VD25OH 41.32 04/22/2014 04:23 PM    Clinical ASCVD: Yes  The ASCVD Risk score (Arnett DK, et al., 2019) failed to calculate for the following reasons:   The 2019 ASCVD risk score is only valid for ages 58 to 27   The patient has a prior MI or stroke diagnosis    Other: CHADS2VASc = 4  Social History   Tobacco Use  Smoking Status Former   Types: Cigarettes   Quit date: 1979   Years since quitting: 44.5  Smokeless Tobacco Never  Tobacco Comments   quit smoking in 1979   BP Readings from Last 3 Encounters:  04/27/22 (!) 141/68  04/18/22 138/74  03/09/22 134/70   Pulse Readings from Last 3 Encounters:  04/27/22 65  04/18/22 78  03/09/22 77   Wt Readings from Last 3 Encounters:  04/27/22 220 lb 3.2 oz (99.9 kg)  04/18/22 225 lb 6.4 oz (102.2 kg)  03/09/22 225 lb 9.6 oz (102.3 kg)    Assessment: Review of patient past medical history, allergies, medications, health status, including review of consultants reports, laboratory and other test data, was performed as part of comprehensive evaluation and provision of chronic care management services.   SDOH:  (Social Determinants of Health) assessments and interventions performed:  SDOH Interventions    Flowsheet Row Most Recent Value  SDOH Interventions   Food Insecurity  Interventions Intervention Not Indicated  Financial Strain Interventions Other (Comment)  [screened for Eliquis PAP - Income did not qualify.]       CCM Care Plan  No Known Allergies  Medications Reviewed Today     Reviewed by Cherre Robins, RPH-CPP (Pharmacist) on 05/12/22 at 1533  Med List Status: <None>   Medication Order Taking? Sig Documenting Provider Last Dose Status Informant  amLODipine (NORVASC) 10 MG tablet [id number] Yes Take 1 tablet (10 mg total) by mouth daily. At night [name], MD Taking Active Self  Ascorbic Acid (VITAMIN C ER PO) [id number] Yes Take  500 mg by mouth daily. [provider] Taking Active Self  atorvastatin (LIPITOR) 40 MG tablet [id number] Yes TAKE 1 TABLET AT BEDTIME Swinyer, Lanice Schwab, NP Taking Active   B Complex-Biotin-FA (B-COMPLEX PO) [id number] Yes Take 1 tablet by mouth daily. [provider] Taking Active Self  co-enzyme Q-10 30 MG capsule [id number] Yes Take 30 mg by mouth daily. [provider] Taking Active Self  ELIQUIS 5 MG TABS tablet [id number] Yes TAKE 1 TABLET TWICE DAILY [name], MD Taking Active   hydrocortisone 2.5 % cream [id number] Yes Apply 1 application topically as needed (skin irritation). [provider] Taking Active Self  lisinopril (ZESTRIL) 20 MG tablet [id number] Yes Take 1 tablet (20 mg total) by mouth daily. [name], MD Taking Active Self  magnesium oxide (MAG-OX) 400 MG tablet [id number] Yes Take 800 mg by mouth daily. [provider] Taking Active Self  Multiple Vitamins-Minerals (MULTIVITAMIN WITH MINERALS) tablet [id number] Yes Take 1 tablet by mouth daily. [provider] Taking Active Self  Omega-3 Fatty Acids (OMEGA-3 FISH OIL) 1200 MG CAPS [id number] Yes Take 2 capsules (2,400 mg total) by mouth daily. Take 2 1,200 mg capsules daily Clovis Mankins, RPH-CPP Taking Active   polyethylene glycol (MIRALAX / GLYCOLAX) packet [id number] Yes  Take 17 g by mouth daily. [provider] Taking Active Self            Patient Active Problem List   Diagnosis Date Noted   Postural dizziness with presyncope 10/06/2021   Elevated TSH 10/06/2021   CAD (coronary artery disease) 10/06/2021   AAA (abdominal aortic aneurysm) ([place]) 10/06/2021   Renal mass 10/06/2021   Elevated troponin 10/06/2021   NSTEMI (non-ST elevated myocardial infarction) ([place]) 10/06/2021   Skin cancer of face 08/05/2020   Depression 04/02/2019   Lower extremity edema 12/22/2017   Coronary artery disease involving native coronary artery of native heart without angina pectoris 12/22/2017   Other specified hypothyroidism 10/02/2017   Osteoarthritis of left hip 06/26/2017   Obesity (BMI 30.0-34.9) 05/08/2017   Hyperglycemia 04/19/2017   Vitamin D deficiency 03/29/2017   Idiopathic peripheral neuropathy 03/13/2017   Erectile dysfunction 02/22/2017   PCP NOTES >>>>>>>>>>>>>>>>>>>>>>>>>>>>>>>>>>>>>>> 12/21/2015   Chronic constipation 12/21/2015   Atrial fibrillation ([place]) 05/07/2014   Encounter for pre-operative cardiovascular clearance 05/07/2014   AAA (abdominal aortic aneurysm) without rupture ([place]) 04/30/2014   Fatigue 11/25/2013   Hypomagnesemia 06/29/2011   Annual physical exam 02/16/2011   Insomnia 02/16/2011   OSA (obstructive sleep apnea)    History of skin cancer 07/24/2008   Coronary atherosclerosis 07/04/2008   Mixed hyperlipidemia 02/08/2007   Essential hypertension 02/08/2007    Immunization History  Administered Date(s) Administered   Fluad Quad(high Dose 65+) 08/19/2020, 08/23/2021   H1N1 11/06/2008   Influenza Split 09/15/2011, 09/14/2012   Influenza Whole 08/06/2010   Influenza, High Dose Seasonal PF 09/16/2015, 09/26/2016, 09/01/2017, 09/03/2018, 08/29/2019   Influenza,inj,Quad PF,6+ Mos 10/01/2013   Influenza-Unspecified 08/31/2014   PFIZER(Purple Top)SARS-COV-2 Vaccination 12/07/2019, 01/01/2020   Pneumococcal  Conjugate-13 04/22/2014   Pneumococcal Polysaccharide-23 01/01/2010, 10/18/2017   Td 04/22/2014   Zoster Recombinat (Shingrix) 09/04/2018, 03/04/2022   Zoster, Live 10/31/2012    Conditions to be addressed/monitored: Atrial Fibrillation, CAD, HTN, HLD and Chronic hip and leg pain; PAD; s/p AAA repair; idiopathic periphireal neuropathy; prediabetes; hypothyroidism; chronic constipation  Care Plan : General Pharmacy (Adult)  Updates made by Cherre Robins, RPH-CPP since 05/12/2022 12:00 AM     Problem: Pre DM; HTN; hyperlipidemia, mixed; AAA;  afib; neuropathy / hip pain; chronic constipation   Priority: High  Onset Date: 02/24/2021  Note:   Current Barriers:  Chronic Disease Management support, education, and care coordination needs related to Hypertension, Hyperlipidemia/CAD, Pre-Diabetes, AFib, Hypothyroidism, Chronic Constipation, chronic pain, osteoarthritis of hip; idiopathic peripheral neuropathy.  Pharmacist Clinical Goal(s):  Over the next 180 days, patient will achieve adherence to monitoring guidelines and medication adherence to achieve therapeutic efficacy maintain control of blood pressure and lipids as evidenced by maintaining goals below  contact provider office for questions/concerns as evidenced notation of same in electronic health record Recheck TSH and restart levothyroxine if needed  through collaboration with PharmD and provider.   Interventions: 1:1 collaboration with Colon Branch, MD regarding development and update of comprehensive plan of care as evidenced by provider attestation and co-signature Inter-disciplinary care team collaboration (see longitudinal plan of care) Comprehensive medication review performed; medication list updated in electronic medical record  Hypertension - BP goal <130/80 (due to aneurysm) Last blood pressure was at goal though a few high blood pressure reading in past; home BP readings are at goal  BP Readings from Last 3 Encounters:   04/27/22 (!) 141/68  04/18/22 138/74  03/09/22 134/70  Home BP readings per patient: 130 to 140's / 60 to 70's Exercise: goes to gym for 60 mintues 3 days per week (weights and elliptical) and golfs 1 to 2 days per week.  Current regimen:  Amlodipine 61m daily in evening Lisinopril 248mdaily in morning Interventions: Discussed blood pressure goal Continue current blood pressure medication regimen Continue to exercise regularly - goal is at least 150 minutes of moderate intensity exercise per week  Hyperlipidemia - LDL goal < 70; Tg goal <150 LDL and Triglycerides are currently at goal  Current regimen:  Atorvastatin 4014maily Fish Oil 1200m68mtake 2 tablets daily CoEnzyme Q10 - 100mg59mly Interventions: Discussed LDL goal Maintain current cholesterol medication regimen.   Atrial Fibrillation / Stroke Prevention:  Controlled CHADSVasc = 4 Wearing outpatient heart monitor currently. Has had 1 episode of atrial fibrillation / flutter. Was asymptomatic.  Current regimen:  Eliquis 5mg t54me a day Magnesium oxide 400mg -42me 2 tablets = 800mg da24mIn 2022 screened for patient assistance for Eliquis - not eligible for 2022.  Interventions: Continue Eliquis at current dose Continue to follow up with cardiology regarding atrial flutter / fibrillation  Pre-Diabetes- A1c goal <6.5% Current controlled  Current regimen:  Diet and exercise management   Interventions: Reviewed diet and exercise Reviewed a1c goal  Hypothyroidism TSH in therapeutic range  Patient stopped levothyroxine 25mcg in15mch 2022 because he felt was not needed and could only get 30 day supply at a time.  Current regimen:  None Interventions: Continue to follow TSH at least yearly Continue without pharmacotherapy since TSH was WNL  Hip and Leg Pain / PAD / Osteoarthritis of hip: Improved; Still has pain but is not limiting activity Medications tried - acetaminophen (not helpful); Aleve / naproxen  (helped with pain but was instructed not to take due to risk of bleeding with Eliquis and oral NSAIDs); diclofenac 1% topical gel (minimal effecacy) At visit 12/30/2020 with vein and vascular; recommended trial of cilostazol. Patient took for 30 days but did not see any improvement in leg pain Has seen ortho for hip pain, tried PT and joint injections with limit efficacy. Saw neurosurgeon 06/23/2021 - felt that pain was cardiac related. No intervention recommended.  Current regimen:  none Interventions: Continue with exercise  3 times per week - goal of at least 150 minutes per week.   Medication management Current pharmacy: [facility] Noted that adherence for 2022 for atorvastatin 86% and amlodipine was 81%.  Per patient he has gotten excess refills from [facility] and did not have to fill as often in 2022. Patient endorses that he is taking all prescription medications regularly and rarely misses doses (maybe once per month)  Interventions Comprehensive medication review performed. Medication list updated Reviewed recent refill history; discussed adherence issues with patient and will follow in 2023.  Continue current medication management strategy Focus on medication adherence by filling and taking medications appropriately  Report any questions or concerns to PharmD and/or provider(s)  Patient Goals/Self-Care Activities Over the next 180 days, patient will:  take medications as prescribed,  check blood pressure once per week, document, and provide at future appointments,  collaborate with provider on medication access solutions, and  target a minimum of 150 minutes of moderate intensity exercise weekly  Follow Up Plan: No further follow up required for Chronic Care Management. Will continue to check adherence.            Medication Assistance: Screened patient for Eliquis / [place] patient assistance 01/2021 and today but he did not meet income requirement for 2022  or 2023.  Patient's preferred pharmacy is:  [facility] [address] ([address]), [place] [address] [place] ([place]) [place] [address] Phone: [phone] Fax: [phone]  [facility] [place], [place] [place] [address] Phone: [phone] Fax: [phone]   Follow Up:  Patient agrees to Care Plan and Follow-up.     No further follow up required: will continue to check in regarding adherence.   Cherre Robins, PharmD Clinical Pharmacist [facility]

## 2022-05-22 ENCOUNTER — Other Ambulatory Visit: Payer: Self-pay | Admitting: Interventional Cardiology

## 2022-05-22 DIAGNOSIS — I25118 Atherosclerotic heart disease of native coronary artery with other forms of angina pectoris: Secondary | ICD-10-CM

## 2022-05-30 DIAGNOSIS — E038 Other specified hypothyroidism: Secondary | ICD-10-CM

## 2022-05-30 DIAGNOSIS — I480 Paroxysmal atrial fibrillation: Secondary | ICD-10-CM | POA: Diagnosis not present

## 2022-05-30 DIAGNOSIS — I10 Essential (primary) hypertension: Secondary | ICD-10-CM

## 2022-05-30 DIAGNOSIS — I2510 Atherosclerotic heart disease of native coronary artery without angina pectoris: Secondary | ICD-10-CM | POA: Diagnosis not present

## 2022-06-01 ENCOUNTER — Other Ambulatory Visit: Payer: Self-pay

## 2022-06-01 DIAGNOSIS — I714 Abdominal aortic aneurysm, without rupture, unspecified: Secondary | ICD-10-CM

## 2022-06-03 DIAGNOSIS — D485 Neoplasm of uncertain behavior of skin: Secondary | ICD-10-CM | POA: Diagnosis not present

## 2022-06-03 DIAGNOSIS — C4442 Squamous cell carcinoma of skin of scalp and neck: Secondary | ICD-10-CM | POA: Diagnosis not present

## 2022-06-03 DIAGNOSIS — D044 Carcinoma in situ of skin of scalp and neck: Secondary | ICD-10-CM | POA: Diagnosis not present

## 2022-06-07 ENCOUNTER — Other Ambulatory Visit: Payer: Self-pay

## 2022-06-07 DIAGNOSIS — I714 Abdominal aortic aneurysm, without rupture, unspecified: Secondary | ICD-10-CM

## 2022-06-07 NOTE — Progress Notes (Signed)
Error

## 2022-06-08 ENCOUNTER — Encounter (INDEPENDENT_AMBULATORY_CARE_PROVIDER_SITE_OTHER): Payer: Self-pay

## 2022-06-20 ENCOUNTER — Ambulatory Visit (HOSPITAL_COMMUNITY)
Admission: RE | Admit: 2022-06-20 | Discharge: 2022-06-20 | Disposition: A | Discharge status: Home / Self Care | Payer: Medicare HMO | Source: Ambulatory Visit | Attending: Vascular Surgery | Admitting: Vascular Surgery

## 2022-06-20 DIAGNOSIS — I714 Abdominal aortic aneurysm, without rupture, unspecified: Secondary | ICD-10-CM | POA: Insufficient documentation

## 2022-06-20 DIAGNOSIS — I701 Atherosclerosis of renal artery: Secondary | ICD-10-CM | POA: Diagnosis not present

## 2022-06-20 MED ORDER — IOHEXOL 350 MG/ML SOLN
100.0000 mL | Freq: Once | INTRAVENOUS | Status: AC | PRN
  Administered 2022-06-20: 100 mL via INTRAVENOUS

## 2022-06-21 DIAGNOSIS — M25562 Pain in left knee: Secondary | ICD-10-CM | POA: Diagnosis not present

## 2022-06-22 ENCOUNTER — Other Ambulatory Visit: Payer: Self-pay | Admitting: Interventional Cardiology

## 2022-06-23 ENCOUNTER — Encounter: Payer: Self-pay | Admitting: Vascular Surgery

## 2022-06-23 ENCOUNTER — Ambulatory Visit: Payer: Medicare HMO | Admitting: Vascular Surgery

## 2022-06-23 VITALS — BP 180/78 | HR 74 | Temp 98.7°F | Resp 20 | Ht 69.0 in | Wt 228.9 lb

## 2022-06-23 DIAGNOSIS — I714 Abdominal aortic aneurysm, without rupture, unspecified: Secondary | ICD-10-CM | POA: Diagnosis not present

## 2022-06-23 DIAGNOSIS — Z48812 Encounter for surgical aftercare following surgery on the circulatory system: Secondary | ICD-10-CM | POA: Diagnosis not present

## 2022-06-23 DIAGNOSIS — I739 Peripheral vascular disease, unspecified: Secondary | ICD-10-CM | POA: Diagnosis not present

## 2022-06-23 NOTE — Progress Notes (Signed)
REASON FOR VISIT:   Follow-up after endovascular aneurysm repair  MEDICAL ISSUES:   S/P ENDOVASCULAR ANEURYSM REPAIR: His aneurysm remains stable in size at 6.7 cm.  There is no evidence of endoleak.  I think we can try to follow this with an ultrasound on the next visit.  I have ordered a follow-up duplex scan in 1 year.  He is right common iliac artery measures 1.4 cm in maximum diameter and has been stable in size.  The left common iliac artery measures 2.4 cm in maximum diameter and also has been stable in size and is largely thrombosed.  If we cannot visualize these with duplex then the subsequent follow-up study will likely have to be a CT angio.  He is not a smoker.  He is on a statin.  He is not on aspirin because he is on Eliquis for A-fib.  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  He is not a smoker.  I encouraged him to stay as active as possible.  We will get ABIs when he returns in 1 year.  He knows to call sooner if he has problems.   HPI:   Philip [name] is a pleasant 81 y.o. male who I last saw on 06/02/2021.  He underwent endovascular repair of a 7 cm abdominal aortic aneurysm on 07/08/2014.  At the time of his last visit, his CT angiogram showed that the maximum diameter of his aneurysm was 6.5 cm.  There was no evidence of endoleak and the graft was in excellent position.  The left hypogastric artery measured 2.4 cm in maximum diameter.  This was largely thrombosed.  The right hypogastric artery measured 1.4 cm in maximum diameter.  He was set up for a 1 year follow-up visit.  Since I saw him last he is injured his left knee and is having some issues with that.  He also describes some claudication in the left calf.  This has been stable.  He denies any rest pain or nonhealing ulcers.  He denies any significant abdominal pain or back pain.  Past Medical History:   Diagnosis Date   AAA (abdominal aortic aneurysm) (HCC)    Atrial fibrillation (HCC)    CAD (coronary artery disease)    had a MI , s/p CABG   Cataracts, bilateral    immature   CHF (congestive heart failure) (HCC)    Dysrhythmia    paroximal a fib   GERD (gastroesophageal reflux disease)    takes Omeprazole daily   History of colon polyps    History of kidney stones    History of shingles    Hyperlipidemia    Hypertension    takes Amlodipine,Metoprolol,and Lisinopril daily   Joint pain    Joint swelling    Myocardial infarction ([place])    several with last one being 2001(but never knew about any except 2001)   OSA (obstructive sleep apnea)    started CPAP 12/09   Peripheral vascular disease ([facility])    Peyronie's disease    s/p penile implant in 2006   Pneumonia    as a child   Skin cancer    several , one of them was melanoma (sees derm routinely)   Tingling    feet occasionally   Tinnitus     Family History  Problem Relation Age of Onset   Prostate cancer  Father 14   Heart disease Father    Skin cancer Father    Heart attack Father    Cancer Father    Deep vein thrombosis Father    Hyperlipidemia Father    Hypertension Father    Heart disease Mother    Skin cancer Mother    Heart attack Mother    Cancer Mother    Hyperlipidemia Mother    Hypertension Mother    Varicose Veins Mother    Peripheral vascular disease Mother        amputation   Sleep apnea Brother    Hyperlipidemia Brother    Hypertension Brother    Prostate cancer Brother 93   Colon cancer Other        aunt   Skin cancer Brother    Skin cancer Sister    Cancer Sister    Heart disease Sister    Diabetes Neg Hx    Stroke Neg Hx     SOCIAL HISTORY: Social History   Tobacco Use   Smoking status: Former    Types: Cigarettes    Quit date: 1979    Years since quitting: 44.6   Smokeless tobacco: Never   Tobacco comments:    quit smoking in 1979  Substance Use Topics   Alcohol use:  Yes    Comment: occasionally - once per week    No Known Allergies  Current Outpatient Medications  Medication Sig Dispense Refill   amLODipine (NORVASC) 10 MG tablet TAKE 1 TABLET EVERY NIGHT 90 tablet 2   Ascorbic Acid (VITAMIN C ER PO) Take 500 mg by mouth daily.     atorvastatin (LIPITOR) 40 MG tablet TAKE 1 TABLET AT BEDTIME 90 tablet 3   B Complex-Biotin-FA (B-COMPLEX PO) Take 1 tablet by mouth daily.     co-enzyme Q-10 30 MG capsule Take 30 mg by mouth daily.     ELIQUIS 5 MG TABS tablet TAKE 1 TABLET TWICE DAILY 180 tablet 1   hydrocortisone 2.5 % cream Apply 1 application topically as needed (skin irritation).     lisinopril (ZESTRIL) 20 MG tablet TAKE 1 TABLET (20 MG TOTAL) BY MOUTH DAILY 90 tablet 3   magnesium oxide (MAG-OX) 400 MG tablet Take 800 mg by mouth daily.     Multiple Vitamins-Minerals (MULTIVITAMIN WITH MINERALS) tablet Take 1 tablet by mouth daily.     Omega-3 Fatty Acids (OMEGA-3 FISH OIL) 1200 MG CAPS Take 2 capsules (2,400 mg total) by mouth daily. Take 2 1,200 mg capsules daily 90 capsule 3   polyethylene glycol (MIRALAX / GLYCOLAX) packet Take 17 g by mouth daily.     No current facility-administered medications for this visit.    REVIEW OF SYSTEMS:  '[X]'$  denotes positive finding, '[ ]'$  denotes negative finding Cardiac  Comments:  Chest pain or chest pressure:    Shortness of breath upon exertion:    Short of breath when lying flat:    Irregular heart rhythm:        Vascular    Pain in calf, thigh, or hip brought on by ambulation: x   Pain in feet at night that wakes you up from your sleep:     Blood clot in your veins:    Leg swelling:         Pulmonary    Oxygen at home:    Productive cough:     Wheezing:         Neurologic    Sudden weakness in arms or  legs:     Sudden numbness in arms or legs:     Sudden onset of difficulty speaking or slurred speech:    Temporary loss of vision in one eye:     Problems with dizziness:          Gastrointestinal    Blood in stool:     Vomited blood:         Genitourinary    Burning when urinating:     Blood in urine:        Psychiatric    Major depression:         Hematologic    Bleeding problems:    Problems with blood clotting too easily:        Skin    Rashes or ulcers:        Constitutional    Fever or chills:     PHYSICAL EXAM:   Vitals:   06/23/22 1559  BP: (!) 180/78  Pulse: 74  Resp: 20  Temp: 98.7 F (37.1 C)  SpO2: 95%  Weight: 228 lb 14.4 oz (103.8 kg)  Height: '5\' 9"'$  (1.753 m)    GENERAL: The patient is a well-nourished male, in no acute distress. The vital signs are documented above. CARDIAC: There is a regular rate and rhythm.  VASCULAR: I do not detect carotid bruits. On the right side he has a palpable femoral, popliteal, and posterior tibial pulse. On the left side he has a palpable femoral pulse.  I cannot palpate a popliteal or pedal pulses.  He does have brisk Doppler signals in the left foot in the posterior tibial and dorsalis pedis positions. He has bilateral lower extremity swelling. PULMONARY: There is good air exchange bilaterally without wheezing or rales. ABDOMEN: Soft and non-tender with normal pitched bowel sounds.  MUSCULOSKELETAL: There are no major deformities or cyanosis. NEUROLOGIC: No focal weakness or paresthesias are detected. SKIN: There are no ulcers or rashes noted. PSYCHIATRIC: The patient has a normal affect.  DATA:    CT ANGIO: I reviewed the images of the CT angio that was done on 06/20/2022.  This shows that the endovascular graft is in excellent position with no evidence of endoleak.  The radiologist measured the aneurysm sac at 6.7 x 6.1 cm in maximum diameter which was unchanged compared to the measurement of 6.7 x 6.0 at the same level on the previous study.  Deitra Mayo [facility] [phone]

## 2022-07-27 DIAGNOSIS — D485 Neoplasm of uncertain behavior of skin: Secondary | ICD-10-CM | POA: Diagnosis not present

## 2022-07-27 DIAGNOSIS — C44612 Basal cell carcinoma of skin of right upper limb, including shoulder: Secondary | ICD-10-CM | POA: Diagnosis not present

## 2022-07-27 DIAGNOSIS — C44619 Basal cell carcinoma of skin of left upper limb, including shoulder: Secondary | ICD-10-CM | POA: Diagnosis not present

## 2022-07-29 DIAGNOSIS — L439 Lichen planus, unspecified: Secondary | ICD-10-CM | POA: Diagnosis not present

## 2022-07-29 DIAGNOSIS — L905 Scar conditions and fibrosis of skin: Secondary | ICD-10-CM | POA: Diagnosis not present

## 2022-07-29 DIAGNOSIS — C4442 Squamous cell carcinoma of skin of scalp and neck: Secondary | ICD-10-CM | POA: Diagnosis not present

## 2022-07-29 DIAGNOSIS — D485 Neoplasm of uncertain behavior of skin: Secondary | ICD-10-CM | POA: Diagnosis not present

## 2022-08-12 DIAGNOSIS — D485 Neoplasm of uncertain behavior of skin: Secondary | ICD-10-CM | POA: Diagnosis not present

## 2022-08-12 DIAGNOSIS — L570 Actinic keratosis: Secondary | ICD-10-CM | POA: Diagnosis not present

## 2022-09-02 DIAGNOSIS — C44612 Basal cell carcinoma of skin of right upper limb, including shoulder: Secondary | ICD-10-CM | POA: Diagnosis not present

## 2022-09-12 DIAGNOSIS — C44619 Basal cell carcinoma of skin of left upper limb, including shoulder: Secondary | ICD-10-CM | POA: Diagnosis not present

## 2022-09-28 DIAGNOSIS — L821 Other seborrheic keratosis: Secondary | ICD-10-CM | POA: Diagnosis not present

## 2022-09-28 DIAGNOSIS — D225 Melanocytic nevi of trunk: Secondary | ICD-10-CM | POA: Diagnosis not present

## 2022-09-28 DIAGNOSIS — C44212 Basal cell carcinoma of skin of right ear and external auricular canal: Secondary | ICD-10-CM | POA: Diagnosis not present

## 2022-09-28 DIAGNOSIS — D492 Neoplasm of unspecified behavior of bone, soft tissue, and skin: Secondary | ICD-10-CM | POA: Diagnosis not present

## 2022-09-28 DIAGNOSIS — Z85828 Personal history of other malignant neoplasm of skin: Secondary | ICD-10-CM | POA: Diagnosis not present

## 2022-09-28 DIAGNOSIS — Z872 Personal history of diseases of the skin and subcutaneous tissue: Secondary | ICD-10-CM | POA: Diagnosis not present

## 2022-09-28 DIAGNOSIS — L570 Actinic keratosis: Secondary | ICD-10-CM | POA: Diagnosis not present

## 2022-09-28 DIAGNOSIS — L814 Other melanin hyperpigmentation: Secondary | ICD-10-CM | POA: Diagnosis not present

## 2022-09-28 DIAGNOSIS — Z08 Encounter for follow-up examination after completed treatment for malignant neoplasm: Secondary | ICD-10-CM | POA: Diagnosis not present

## 2022-10-28 ENCOUNTER — Encounter: Payer: Self-pay | Admitting: Internal Medicine

## 2022-10-28 ENCOUNTER — Ambulatory Visit (INDEPENDENT_AMBULATORY_CARE_PROVIDER_SITE_OTHER): Payer: Medicare HMO | Admitting: Internal Medicine

## 2022-10-28 VITALS — BP 146/72 | HR 63 | Temp 98.1°F | Resp 18 | Ht 69.0 in | Wt 228.4 lb

## 2022-10-28 DIAGNOSIS — R739 Hyperglycemia, unspecified: Secondary | ICD-10-CM

## 2022-10-28 DIAGNOSIS — I10 Essential (primary) hypertension: Secondary | ICD-10-CM | POA: Diagnosis not present

## 2022-10-28 DIAGNOSIS — I110 Hypertensive heart disease with heart failure: Secondary | ICD-10-CM | POA: Diagnosis not present

## 2022-10-28 DIAGNOSIS — I509 Heart failure, unspecified: Secondary | ICD-10-CM | POA: Diagnosis not present

## 2022-10-28 DIAGNOSIS — I739 Peripheral vascular disease, unspecified: Secondary | ICD-10-CM

## 2022-10-28 MED ORDER — LOSARTAN POTASSIUM-HCTZ 50-12.5 MG PO TABS: 1.0000 | ORAL_TABLET | Freq: Every day | ORAL | 0 refills | Status: DC

## 2022-10-28 NOTE — Progress Notes (Signed)
Subjective:    Patient ID: Philip [name], male    DOB: 1941/01/07, 81 y.o.   MRN: [id number]  DOS:  10/28/2022 Type of visit - description: 6 months follow-up  Since the last office visit is doing well. Developed left knee pain, saw orthopedics, knee is still hurting, he is still able to exercise 30 minutes 3 times a week.  Denies chest pain or difficulty breathing. No lower extremity edema or palpitations  Saw cardiovascular surgery, note reviewed.   BP Readings from Last 3 Encounters:  10/28/22 (!) 146/72  06/23/22 (!) 180/78  04/27/22 (!) 141/68    Review of Systems See above   Past Medical History:  Diagnosis Date   AAA (abdominal aortic aneurysm) ([place])    Atrial fibrillation ([place])    CAD (coronary artery disease)    had a MI , s/p CABG   Cataracts, bilateral    immature   CHF (congestive heart failure) ([place])    Dysrhythmia    paroximal a fib   GERD (gastroesophageal reflux disease)    takes Omeprazole daily   History of colon polyps    History of kidney stones    History of shingles    Hyperlipidemia    Hypertension    takes Amlodipine,Metoprolol,and Lisinopril daily   Joint pain    Joint swelling    Myocardial infarction ([place])    several with last one being 2001(but never knew about any except 2001)   OSA (obstructive sleep apnea)    started CPAP 12/09   Peripheral vascular disease ([place])    Peyronie's disease    s/p penile implant in 2006   Pneumonia    as a child   Skin cancer    several , one of them was melanoma (sees derm routinely)   Tingling    feet occasionally   Tinnitus     Past Surgical History:  Procedure Laterality Date   ABDOMINAL AORTAGRAM N/A 06/16/2014   Procedure: ABDOMINAL Maxcine Ham;  Surgeon: Angelia Mould, MD;  Location: [facility] CATH LAB;  Service: Cardiovascular;  Laterality: N/A;   ABDOMINAL AORTIC ENDOVASCULAR STENT GRAFT N/A 07/08/2014   Procedure: ABDOMINAL AORTIC ENDOVASCULAR STENT GRAFT/ GORE;  Surgeon:  Angelia Mould, MD;  Location: [place];  Service: Vascular;  Laterality: N/A;   CARDIAC CATHETERIZATION  10/31/2013   COLONOSCOPY     CORONARY ARTERY BYPASS GRAFT  10/31/1997   x 3 vessels   LEFT HEART CATH AND CORS/GRAFTS ANGIOGRAPHY N/A 10/07/2021   Procedure: LEFT HEART CATH AND CORS/GRAFTS ANGIOGRAPHY;  Surgeon: Burnell Blanks, MD;  Location: [place] CV LAB;  Service: Cardiovascular;  Laterality: N/A;   LEFT HEART CATHETERIZATION WITH CORONARY/GRAFT ANGIOGRAM N/A 05/29/2014   Procedure: LEFT HEART CATHETERIZATION WITH [name];  Surgeon: [name], MD;  Location: [facility] CATH LAB;  Service: Cardiovascular;  Laterality: N/A;   PENILE PROSTHESIS IMPLANT  08/31/2005   AMS inflatable penile prosthesis   RHINOPLASTY  10/31/1973    Current Outpatient Medications  Medication Instructions   amLODipine (NORVASC) 10 MG tablet TAKE 1 TABLET EVERY NIGHT   Ascorbic Acid (VITAMIN C ER PO) 500 mg, Oral, Daily   atorvastatin (LIPITOR) 40 MG tablet TAKE 1 TABLET AT BEDTIME   B Complex-Biotin-FA (B-COMPLEX PO) 1 tablet, Daily   co-enzyme Q-10 30 mg, Daily   ELIQUIS 5 MG TABS tablet TAKE 1 TABLET TWICE DAILY   hydrocortisone 2.5 % cream 1 application , Topical, As needed   losartan-hydrochlorothiazide (HYZAAR) 50-12.5 MG tablet 1  tablet, Oral, Daily   magnesium oxide (MAG-OX) 800 mg, Daily   Multiple Vitamins-Minerals (MULTIVITAMIN WITH MINERALS) tablet 1 tablet, Daily   Omega-3 Fish Oil 2,400 mg, Oral, Daily, Take 2 1,200 mg capsules daily   polyethylene glycol (MIRALAX / GLYCOLAX) 17 g, Oral, Daily       Objective:   Physical Exam BP (!) 146/72   Pulse 63   Temp 98.1 F (36.7 C) (Oral)   Resp 18   Ht '5\' 9"'$  (1.753 m)   Wt 228 lb 6 oz (103.6 kg)   SpO2 96%   BMI 33.73 kg/m  General:   Well developed, NAD, BMI noted. HEENT:  Normocephalic . Face symmetric, atraumatic Lungs:  CTA B Normal respiratory effort, no intercostal retractions, no accessory  muscle use. Heart: RRR,  no murmur.  Lower extremities: some pretibial edema bilaterally  Skin: Not pale. Not jaundice Neurologic:  alert & oriented X3.  Speech normal, gait appropriate for age and unassisted Psych--  Cognition and judgment appear intact.  Cooperative with normal attention span and concentration.  Behavior appropriate. No anxious or depressed appearing.      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 Fib, paroxysmal  GERD  Chronic constipation OSA- Cpap (Dr [name]) ED- penile implant H/o skin cancer, Dr. 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  watch for uncontrolled HTN, hypokalemia  -Cysts pancreas: Follow-up MRI in 2 years as recommended  PLAN: Hyperglycemia: Encouraged a  healthy diet and exercise regularly as he is doing.  Check A1c HTN: Not well-controlled, at home ranges from the 140s to 160s.  BP was in the 180s when he saw vascular surgery. Upon arrival BP was elevated, it was rechecked and decreased to the 140s. Currently on lisinopril 20 mg, amlodipine 10 mg. Plan: Change lisinopril to losartan HCT 50-12.5.  BMP in 2 weeks. Continue amlodipine, watch for increasing edema. Monitor BPs.  Low-salt diet, continue avoiding NSAIDs. S/p aneurysm repair, PVD: Saw vascular surgery 05-2022, they reviewed the CT angio done on 06/20/2022 and everything looked satisfactory. Preventive care: Declined COVID flu and pneumonia shots RTC for additional labs 2 weeks RTC next visit 3 to 4 months

## 2022-10-28 NOTE — Patient Instructions (Addendum)
Stop lisinopril  Start losartan HCT 50-12.5 mg 1 tablet every morning  Other medications the same  Check the  blood pressure regularly BP GOAL is between 110/65 and  135/85. If it is consistently higher or lower, let me know   GO TO THE LAB : Get the blood work     [place], [place] back for more blood work in 2 weeks  Come back for a checkup in 3 to 4 months

## 2022-10-29 LAB — BASIC METABOLIC PANEL
BUN: 23 mg/dL (ref 7–25)
CO2: 22 mmol/L (ref 20–32)
Calcium: 9.8 mg/dL (ref 8.6–10.3)
Chloride: 101 mmol/L (ref 98–110)
Creat: 1.11 mg/dL (ref 0.70–1.22)
Glucose, Bld: 92 mg/dL (ref 65–99)
Potassium: 4.9 mmol/L (ref 3.5–5.3)
Sodium: 138 mmol/L (ref 135–146)

## 2022-10-29 LAB — HEMOGLOBIN A1C
Hgb A1c MFr Bld: 5.9 % of total Hgb — ABNORMAL HIGH (ref <5.7)
Mean Plasma Glucose: 123 mg/dL
eAG (mmol/L): 6.8 mmol/L

## 2022-10-29 NOTE — Assessment & Plan Note (Signed)
Hyperglycemia: Encouraged a  healthy diet and exercise regularly as he is doing.  Check A1c HTN: Not well-controlled, at home ranges from the 140s to 160s.  BP was in the 180s when he saw vascular surgery. Upon arrival BP was elevated, it was rechecked and decreased to the 140s. Currently on lisinopril 20 mg, amlodipine 10 mg. Plan: Change lisinopril to losartan HCT 50-12.5.  BMP in 2 weeks. Continue amlodipine, watch for increasing edema. Monitor BPs.  Low-salt diet, continue avoiding NSAIDs. S/p aneurysm repair, PVD: Saw vascular surgery 05-2022, they reviewed the CT angio done on 06/20/2022 and everything looked satisfactory. Preventive care: Declined COVID flu and pneumonia shots RTC for additional labs 2 weeks RTC next visit 3 to 4 months

## 2022-11-09 DIAGNOSIS — H524 Presbyopia: Secondary | ICD-10-CM | POA: Diagnosis not present

## 2022-11-09 DIAGNOSIS — Z961 Presence of intraocular lens: Secondary | ICD-10-CM | POA: Diagnosis not present

## 2022-11-09 DIAGNOSIS — Z135 Encounter for screening for eye and ear disorders: Secondary | ICD-10-CM | POA: Diagnosis not present

## 2022-11-14 ENCOUNTER — Other Ambulatory Visit (INDEPENDENT_AMBULATORY_CARE_PROVIDER_SITE_OTHER): Payer: Medicare HMO

## 2022-11-14 DIAGNOSIS — I10 Essential (primary) hypertension: Secondary | ICD-10-CM | POA: Diagnosis not present

## 2022-11-14 LAB — BASIC METABOLIC PANEL
BUN: 21 mg/dL (ref 6–23)
CO2: 31 mEq/L (ref 19–32)
Calcium: 9.8 mg/dL (ref 8.4–10.5)
Chloride: 99 mEq/L (ref 96–112)
Creatinine, Ser: 1.19 mg/dL (ref 0.40–1.50)
GFR: 56.9 mL/min — ABNORMAL LOW (ref >60.00)
Glucose, Bld: 103 mg/dL — ABNORMAL HIGH (ref 70–99)
Potassium: 4.5 mEq/L (ref 3.5–5.1)
Sodium: 138 mEq/L (ref 135–145)

## 2022-11-18 DIAGNOSIS — C44212 Basal cell carcinoma of skin of right ear and external auricular canal: Secondary | ICD-10-CM | POA: Diagnosis not present

## 2022-11-18 DIAGNOSIS — C4441 Basal cell carcinoma of skin of scalp and neck: Secondary | ICD-10-CM | POA: Diagnosis not present

## 2022-11-18 DIAGNOSIS — D485 Neoplasm of uncertain behavior of skin: Secondary | ICD-10-CM | POA: Diagnosis not present

## 2022-11-24 DIAGNOSIS — S01301A Unspecified open wound of right ear, initial encounter: Secondary | ICD-10-CM | POA: Diagnosis not present

## 2022-12-05 ENCOUNTER — Ambulatory Visit (INDEPENDENT_AMBULATORY_CARE_PROVIDER_SITE_OTHER): Payer: Medicare HMO | Admitting: Family Medicine

## 2022-12-05 ENCOUNTER — Encounter: Payer: Self-pay | Admitting: Family Medicine

## 2022-12-05 VITALS — BP 132/76 | HR 87 | Temp 98.2°F | Resp 12 | Ht 69.0 in | Wt 225.8 lb

## 2022-12-05 DIAGNOSIS — R059 Cough, unspecified: Secondary | ICD-10-CM | POA: Diagnosis not present

## 2022-12-05 DIAGNOSIS — I10 Essential (primary) hypertension: Secondary | ICD-10-CM

## 2022-12-05 LAB — POC COVID19 BINAXNOW: SARS Coronavirus 2 Ag: NEGATIVE

## 2022-12-05 LAB — POCT INFLUENZA A/B
Influenza A, POC: NEGATIVE
Influenza B, POC: NEGATIVE

## 2022-12-05 MED ORDER — BENZONATATE 200 MG PO CAPS: 200.0000 mg | ORAL_CAPSULE | Freq: Three times a day (TID) | ORAL | 0 refills | Status: DC | PRN

## 2022-12-05 NOTE — Assessment & Plan Note (Signed)
Patient is negative for covid and influenza. Prescribed Benzonatate '200mg'$  tablets, every 8 hours as needed for cough. Informed patient if his symptoms become worse or didn't improve by Thursday or Friday, send a message through [place]. Discussed about continuing supportive measures and taking OTC Mucinex.

## 2022-12-05 NOTE — Assessment & Plan Note (Addendum)
Blood pressure is at goal today with patient reporting that he is taking Lisinopril and Amlodipine. Recommend patient  to continue Lisinopril and Amlodipine. Also, recommend patient to monitor his blood pressure. If it increases, would like patient to follow up sooner with this PCP before his scheduled appointment in March.

## 2022-12-05 NOTE — Progress Notes (Signed)
Subjective:     Patient ID: Philip [name], male    DOB: [birth year], 82 y.o.   MRN: [id number]  No chief complaint on file.   Sinus Problem Associated symptoms include coughing.  Cough   Patient is in today for cough.  Last Thursday, he started with had sinus drainage Friday and Saturday symptoms become worse. On Saturday, he started OTC Cold/Flu medication. Reports it has helped some.  Last night, started sneezing, chest "bronchial" pain with cough. Denies any cardiac chest pain.  Reports 2-3 times of productive cough, beige in color, thick sputum. Otherwise, a non-productive cough.  Denies ear pain, headaches, fever, and sore throat. He reports scratchy last week,  but has subsided. Denies SOB. Denies any nausea or vomiting.   HTN: On previous visit on 12/29, patients blood pressure was elevated. His blood pressure medication was changed from Lisinopril to Losartan/HCTZ with continuing Amlodipine.  Patient reports he took the new medication (Losartan/HCTZ) with no change. He reports he had a 30 day supply and afterwards, he started back on Lisinopril in the morning and continued Amlodipine at night. He reports while talking Losartan/HCTZ he checked his blood pressure with no improvement in readings.    There are no preventive care reminders to display for this patient.  Past Medical History:  Diagnosis Date   AAA (abdominal aortic aneurysm) ([place])    Atrial fibrillation ([facility])    CAD (coronary artery disease)    had a MI , s/p CABG   Cataracts, bilateral    immature   CHF (congestive heart failure) ([facility])    Dysrhythmia    paroximal a fib   GERD (gastroesophageal reflux disease)    takes Omeprazole daily   History of colon polyps    History of kidney stones    History of shingles    Hyperlipidemia    Hypertension    takes Amlodipine,Metoprolol,and Lisinopril daily   Joint pain    Joint swelling    Myocardial infarction ([place])    several with last one being 2001(but  never knew about any except 2001)   OSA (obstructive sleep apnea)    started CPAP 12/09   Peripheral vascular disease ([place])    Peyronie's disease    s/p penile implant in 2006   Pneumonia    as a child   Skin cancer    several , one of them was melanoma (sees derm routinely)   Tingling    feet occasionally   Tinnitus     Past Surgical History:  Procedure Laterality Date   ABDOMINAL AORTAGRAM N/A 06/16/2014   Procedure: ABDOMINAL Maxcine Ham;  Surgeon: Angelia Mould, MD;  Location: [facility] CATH LAB;  Service: Cardiovascular;  Laterality: N/A;   ABDOMINAL AORTIC ENDOVASCULAR STENT GRAFT N/A 07/08/2014   Procedure: ABDOMINAL AORTIC ENDOVASCULAR STENT GRAFT/ GORE;  Surgeon: Angelia Mould, MD;  Location: [place];  Service: Vascular;  Laterality: N/A;   CARDIAC CATHETERIZATION  10/31/2013   COLONOSCOPY     CORONARY ARTERY BYPASS GRAFT  10/31/1997   x 3 vessels   LEFT HEART CATH AND CORS/GRAFTS ANGIOGRAPHY N/A 10/07/2021   Procedure: LEFT HEART CATH AND CORS/GRAFTS ANGIOGRAPHY;  Surgeon: Burnell Blanks, MD;  Location: [place] CV LAB;  Service: Cardiovascular;  Laterality: N/A;   LEFT HEART CATHETERIZATION WITH CORONARY/GRAFT ANGIOGRAM N/A 05/29/2014   Procedure: LEFT HEART CATHETERIZATION WITH [name];  Surgeon: [name], MD;  Location: [facility] CATH LAB;  Service: Cardiovascular;  Laterality: N/A;   PENILE PROSTHESIS  IMPLANT  08/31/2005   AMS inflatable penile prosthesis   RHINOPLASTY  10/31/1973    Family History  Problem Relation Age of Onset   Prostate cancer Father 65   Heart disease Father    Skin cancer Father    Heart attack Father    Cancer Father    Deep vein thrombosis Father    Hyperlipidemia Father    Hypertension Father    Heart disease Mother    Skin cancer Mother    Heart attack Mother    Cancer Mother    Hyperlipidemia Mother    Hypertension Mother    Varicose Veins Mother    Peripheral vascular disease Mother         amputation   Sleep apnea Brother    Hyperlipidemia Brother    Hypertension Brother    Prostate cancer Brother 68   Colon cancer Other        aunt   Skin cancer Brother    Skin cancer Sister    Cancer Sister    Heart disease Sister    Diabetes Neg Hx    Stroke Neg Hx     Social History   Socioeconomic History   Marital status: Married    Spouse name: Not on file   Number of children: 3   Years of education: 12   Highest education level: Not on file  Occupational History   Occupation: retired, Actor  Tobacco Use   Smoking status: Former    Types: Cigarettes    Quit date: 1979    Years since quitting: 45.1   Smokeless tobacco: Never   Tobacco comments:    quit smoking in 1979  Vaping Use   Vaping Use: Never used  Substance and Sexual Activity   Alcohol use: Yes    Comment: occasionally - once per week   Drug use: No   Sexual activity: Not Currently  Other Topics Concern   Not on file  Social History Narrative   Lives w/ wife in a 2 story home.  Has one son and 2 stepchildren.  Retired for [employer].     Education: high school.   Social Determinants of Health   Financial Resource Strain: Low Risk  (05/12/2022)   Overall Financial Resource Strain (CARDIA)    Difficulty of Paying Living Expenses: Not very hard  Food Insecurity: No Food Insecurity (05/12/2022)   Hunger Vital Sign    Worried About Running Out of Food in the Last Year: Never true    Ran Out of Food in the Last Year: Never true  Transportation Needs: No Transportation Needs (11/11/2021)   PRAPARE - Hydrologist (Medical): No    Lack of Transportation (Non-Medical): No  Physical Activity: Sufficiently Active (11/11/2021)   Exercise Vital Sign    Days of Exercise per Week: 3 days    Minutes of Exercise per Session: 60 min  Stress: No Stress Concern Present (02/28/2022)   [place]    Feeling of Stress :  Not at all  Social Connections: Moderately Integrated (02/28/2022)   Social Connection and Isolation Panel [NHANES]    Frequency of Communication with Friends and Family: More than three times a week    Frequency of Social Gatherings with Friends and Family: More than three times a week    Attends Religious Services: Never    Marine scientist or Organizations: Yes    Attends Archivist  Meetings: More than 4 times per year    Marital Status: Married  Human resources officer Violence: Not At Risk (02/28/2022)   Humiliation, Afraid, Rape, and Kick questionnaire    Fear of Current or Ex-Partner: No    Emotionally Abused: No    Physically Abused: No    Sexually Abused: No    Outpatient Medications Prior to Visit  Medication Sig Dispense Refill   amLODipine (NORVASC) 10 MG tablet TAKE 1 TABLET EVERY NIGHT 90 tablet 2   Ascorbic Acid (VITAMIN C ER PO) Take 500 mg by mouth daily.     atorvastatin (LIPITOR) 40 MG tablet TAKE 1 TABLET AT BEDTIME 90 tablet 3   B Complex-Biotin-FA (B-COMPLEX PO) Take 1 tablet by mouth daily.     co-enzyme Q-10 30 MG capsule Take 30 mg by mouth daily.     ELIQUIS 5 MG TABS tablet TAKE 1 TABLET TWICE DAILY 180 tablet 1   hydrocortisone 2.5 % cream Apply 1 application topically as needed (skin irritation).     losartan-hydrochlorothiazide (HYZAAR) 50-12.5 MG tablet Take 1 tablet by mouth daily. 30 tablet 0   magnesium oxide (MAG-OX) 400 MG tablet Take 800 mg by mouth daily.     Multiple Vitamins-Minerals (MULTIVITAMIN WITH MINERALS) tablet Take 1 tablet by mouth daily.     Omega-3 Fatty Acids (OMEGA-3 FISH OIL) 1200 MG CAPS Take 2 capsules (2,400 mg total) by mouth daily. Take 2 1,200 mg capsules daily 90 capsule 3   polyethylene glycol (MIRALAX / GLYCOLAX) packet Take 17 g by mouth daily.     No facility-administered medications prior to visit.    No Known Allergies  Review of Systems  Respiratory:  Positive for cough.    See HPI      Objective:     Physical Exam Vitals reviewed.  Constitutional:      General: He is not in acute distress.    Appearance: Normal appearance. He is not ill-appearing or toxic-appearing.  HENT:     Head: Normocephalic.     Right Ear: Tympanic membrane normal. There is impacted cerumen.     Left Ear: Tympanic membrane normal.     Nose:     Right Sinus: No maxillary sinus tenderness or frontal sinus tenderness.     Left Sinus: No maxillary sinus tenderness or frontal sinus tenderness.     Mouth/Throat:     Mouth: Mucous membranes are moist.     Pharynx: Oropharynx is clear. No oropharyngeal exudate or posterior oropharyngeal erythema.  Eyes:     General:        Right eye: No discharge.        Left eye: No discharge.     Conjunctiva/sclera: Conjunctivae normal.  Cardiovascular:     Rate and Rhythm: Normal rate and regular rhythm.     Heart sounds: Normal heart sounds. No murmur heard.    No friction rub. No gallop.     Comments: Trace of edema in bilateral extremities  Pulmonary:     Effort: Pulmonary effort is normal. No respiratory distress.     Breath sounds: Normal breath sounds.  Musculoskeletal:     Right lower leg: Edema present.     Left lower leg: Edema present.  Skin:    General: Skin is warm and dry.  Neurological:     General: No focal deficit present.     Mental Status: He is alert. Mental status is at baseline.  Psychiatric:        Mood and  Affect: Mood normal.        Behavior: Behavior normal.        Thought Content: Thought content normal.        Judgment: Judgment normal.      There were no vitals taken for this visit. Wt Readings from Last 3 Encounters:  10/28/22 228 lb 6 oz (103.6 kg)  06/23/22 228 lb 14.4 oz (103.8 kg)  04/27/22 220 lb 3.2 oz (99.9 kg)       Assessment & Plan:   Problem List Items Addressed This Visit   None   I am having [name] maintain his multivitamin with minerals, co-enzyme Q-10, B Complex-Biotin-FA (B-COMPLEX PO), magnesium  oxide, hydrocortisone, polyethylene glycol, Ascorbic Acid (VITAMIN C ER PO), Omega-3 Fish Oil, atorvastatin, Eliquis, amLODipine, and losartan-hydrochlorothiazide.  No orders of the defined types were placed in this encounter.

## 2022-12-23 DIAGNOSIS — C44212 Basal cell carcinoma of skin of right ear and external auricular canal: Secondary | ICD-10-CM | POA: Diagnosis not present

## 2022-12-23 DIAGNOSIS — S0100XA Unspecified open wound of scalp, initial encounter: Secondary | ICD-10-CM | POA: Diagnosis not present

## 2022-12-23 DIAGNOSIS — C4441 Basal cell carcinoma of skin of scalp and neck: Secondary | ICD-10-CM | POA: Diagnosis not present

## 2023-01-25 DIAGNOSIS — L905 Scar conditions and fibrosis of skin: Secondary | ICD-10-CM | POA: Diagnosis not present

## 2023-01-25 DIAGNOSIS — D485 Neoplasm of uncertain behavior of skin: Secondary | ICD-10-CM | POA: Diagnosis not present

## 2023-01-25 DIAGNOSIS — C4441 Basal cell carcinoma of skin of scalp and neck: Secondary | ICD-10-CM | POA: Diagnosis not present

## 2023-01-25 DIAGNOSIS — C4442 Squamous cell carcinoma of skin of scalp and neck: Secondary | ICD-10-CM | POA: Diagnosis not present

## 2023-01-26 DIAGNOSIS — H18413 Arcus senilis, bilateral: Secondary | ICD-10-CM | POA: Diagnosis not present

## 2023-01-26 DIAGNOSIS — H26492 Other secondary cataract, left eye: Secondary | ICD-10-CM | POA: Diagnosis not present

## 2023-01-26 DIAGNOSIS — Z961 Presence of intraocular lens: Secondary | ICD-10-CM | POA: Diagnosis not present

## 2023-01-26 DIAGNOSIS — H02831 Dermatochalasis of right upper eyelid: Secondary | ICD-10-CM | POA: Diagnosis not present

## 2023-01-27 ENCOUNTER — Ambulatory Visit: Payer: Medicare HMO | Admitting: Internal Medicine

## 2023-01-30 ENCOUNTER — Ambulatory Visit (INDEPENDENT_AMBULATORY_CARE_PROVIDER_SITE_OTHER): Payer: Medicare HMO | Admitting: Internal Medicine

## 2023-01-30 ENCOUNTER — Encounter: Payer: Self-pay | Admitting: Internal Medicine

## 2023-01-30 VITALS — BP 138/86 | HR 82 | Temp 98.1°F | Resp 18 | Ht 69.0 in | Wt 227.1 lb

## 2023-01-30 DIAGNOSIS — I739 Peripheral vascular disease, unspecified: Secondary | ICD-10-CM | POA: Diagnosis not present

## 2023-01-30 DIAGNOSIS — I10 Essential (primary) hypertension: Secondary | ICD-10-CM

## 2023-01-30 DIAGNOSIS — I509 Heart failure, unspecified: Secondary | ICD-10-CM | POA: Diagnosis not present

## 2023-01-30 DIAGNOSIS — I480 Paroxysmal atrial fibrillation: Secondary | ICD-10-CM | POA: Diagnosis not present

## 2023-01-30 DIAGNOSIS — I110 Hypertensive heart disease with heart failure: Secondary | ICD-10-CM | POA: Diagnosis not present

## 2023-01-30 MED ORDER — LOSARTAN POTASSIUM-HCTZ 50-12.5 MG PO TABS: 1.0000 | ORAL_TABLET | Freq: Every day | ORAL | 1 refills | Status: DC

## 2023-01-30 NOTE — Assessment & Plan Note (Signed)
HTN On  December 2023 BP was elevated, was rec to cont  amlodipine, lisinopril was changed to losartan HCT 50-12.5 mg.  Subsequently was seen at another office, he reported he was not taking losartan HCT (ran out) and went back on lisinopril. Ambulatory BPs reviewed, recent readings run from 160s to 139.  Diastolic BP normal. Plan: Continue amlodipine, change lisinopril to losartan HCT 50-12.5 mg 1 tablet daily.  Monitor BPs, Future labs:BMP CBC.  See AVS. Right leg: R calf noted to be larger, no TTP, no pitting edema, patient reports this is a chronic issue. RTC 3 months CPX

## 2023-01-30 NOTE — Patient Instructions (Addendum)
Stop lisinopril  Start losartan HCT 50-12.5 mg 1 tablet every morning  Other medications the same  Check the  blood pressure regularly BP GOAL is between 110/65 and  135/85. If it is consistently higher or lower, let me know   [place], [place] back for blood work only in 2 weeks  Come back for a physical exam by 05/2023   Vaccines I recommend:  RSV vaccine

## 2023-01-30 NOTE — Progress Notes (Signed)
Subjective:    Patient ID: Philip [name], male    DOB: 1941/08/11, 82 y.o.   MRN: [id number]  DOS:  01/30/2023 Type of visit - description: f/u  Since the last office visit is doing well. We talk about hypertension management. Ambulatory BPs reviewed. Denies chest pain- difficulty breathing. No headache.  No nausea vomiting.  Review of Systems See above   Past Medical History:  Diagnosis Date   AAA (abdominal aortic aneurysm)    Atrial fibrillation    CAD (coronary artery disease)    had a MI , s/p CABG   Cataracts, bilateral    immature   CHF (congestive heart failure)    Dysrhythmia    paroximal a fib   GERD (gastroesophageal reflux disease)    takes Omeprazole daily   History of colon polyps    History of kidney stones    History of shingles    Hyperlipidemia    Hypertension    takes Amlodipine,Metoprolol,and Lisinopril daily   Joint pain    Joint swelling    Myocardial infarction    several with last one being 2001(but never knew about any except 2001)   OSA (obstructive sleep apnea)    started CPAP 12/09   Peripheral vascular disease    Peyronie's disease    s/p penile implant in 2006   Pneumonia    as a child   Skin cancer    several , one of them was melanoma (sees derm routinely)   Tingling    feet occasionally   Tinnitus     Past Surgical History:  Procedure Laterality Date   ABDOMINAL AORTAGRAM N/A 06/16/2014   Procedure: ABDOMINAL Maxcine Ham;  Surgeon: Angelia Mould, MD;  Location: [facility] CATH LAB;  Service: Cardiovascular;  Laterality: N/A;   ABDOMINAL AORTIC ENDOVASCULAR STENT GRAFT N/A 07/08/2014   Procedure: ABDOMINAL AORTIC ENDOVASCULAR STENT GRAFT/ GORE;  Surgeon: Angelia Mould, MD;  Location: [place];  Service: Vascular;  Laterality: N/A;   CARDIAC CATHETERIZATION  10/31/2013   COLONOSCOPY     CORONARY ARTERY BYPASS GRAFT  10/31/1997   x 3 vessels   LEFT HEART CATH AND CORS/GRAFTS ANGIOGRAPHY N/A 10/07/2021   Procedure: LEFT  HEART CATH AND CORS/GRAFTS ANGIOGRAPHY;  Surgeon: Burnell Blanks, MD;  Location: [place] CV LAB;  Service: Cardiovascular;  Laterality: N/A;   LEFT HEART CATHETERIZATION WITH CORONARY/GRAFT ANGIOGRAM N/A 05/29/2014   Procedure: LEFT HEART CATHETERIZATION WITH [name];  Surgeon: [name], MD;  Location: [facility] CATH LAB;  Service: Cardiovascular;  Laterality: N/A;   PENILE PROSTHESIS IMPLANT  08/31/2005   AMS inflatable penile prosthesis   RHINOPLASTY  10/31/1973    Current Outpatient Medications  Medication Instructions   amLODipine (NORVASC) 10 MG tablet TAKE 1 TABLET EVERY NIGHT   Ascorbic Acid (VITAMIN C ER PO) 500 mg, Oral, Daily   atorvastatin (LIPITOR) 40 MG tablet TAKE 1 TABLET AT BEDTIME   B Complex-Biotin-FA (B-COMPLEX PO) 1 tablet, Daily   benzonatate (TESSALON) 200 mg, Oral, 3 times daily PRN   co-enzyme Q-10 30 mg, Daily   ELIQUIS 5 MG TABS tablet TAKE 1 TABLET TWICE DAILY   hydrocortisone 2.5 % cream 1 application , Topical, As needed   lisinopril (ZESTRIL) 10 mg, Oral, Daily   magnesium oxide (MAG-OX) 800 mg, Daily   Multiple Vitamins-Minerals (MULTIVITAMIN WITH MINERALS) tablet 1 tablet, Daily   Omega-3 Fish Oil 2,400 mg, Oral, Daily, Take 2 1,200 mg capsules daily   polyethylene glycol (MIRALAX /  GLYCOLAX) 17 g, Oral, Daily       Objective:   Physical Exam BP 138/86   Pulse 82   Temp 98.1 F (36.7 C) (Oral)   Resp 18   Ht 5\' 9"  (1.753 m)   Wt 227 lb 2 oz (103 kg)   SpO2 95%   BMI 33.54 kg/m  General:   Well developed, NAD, BMI noted. HEENT:  Normocephalic . Face symmetric, atraumatic Lungs:  CTA B Normal respiratory effort, no intercostal retractions, no accessory muscle use. Heart: RRR,  no murmur.  Lower extremities: no pretibial pitting edema, R calf is slightly larger in circumference.  No TTP. Skin: Not pale. Not jaundice Neurologic:  alert & oriented X3.  Speech normal, gait appropriate for age and  unassisted Psych--  Cognition and judgment appear intact.  Cooperative with normal attention span and concentration.  Behavior appropriate. No anxious or depressed appearing.      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 Fib, paroxysmal  GERD  Chronic constipation OSA- Cpap (Dr [name]) ED- penile implant H/o skin cancer, Dr. 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  watch for uncontrolled HTN, hypokalemia  -Cysts pancreas: Follow-up MRI in 2 years as recommended  PLAN: HTN On  December 2023 BP was elevated, was rec to cont  amlodipine, lisinopril was changed to losartan HCT 50-12.5 mg.  Subsequently was seen at another office, he reported he was not taking losartan HCT (ran out) and went back on lisinopril. Ambulatory BPs reviewed, recent readings run from 160s to 139.  Diastolic BP normal. Plan: Continue amlodipine, change lisinopril to losartan HCT 50-12.5 mg 1 tablet daily.  Monitor BPs, Future labs:BMP CBC.  See AVS. Right leg: R calf noted to be larger, no TTP, no pitting edema, patient reports this is a chronic issue. RTC 3 months CPX

## 2023-02-03 DIAGNOSIS — H26492 Other secondary cataract, left eye: Secondary | ICD-10-CM | POA: Diagnosis not present

## 2023-02-04 ENCOUNTER — Other Ambulatory Visit: Payer: Self-pay | Admitting: Interventional Cardiology

## 2023-02-04 DIAGNOSIS — I2510 Atherosclerotic heart disease of native coronary artery without angina pectoris: Secondary | ICD-10-CM

## 2023-02-04 DIAGNOSIS — I480 Paroxysmal atrial fibrillation: Secondary | ICD-10-CM

## 2023-02-06 NOTE — Telephone Encounter (Signed)
Prescription refill request for Eliquis received. Indication: Afib  Last office visit: 03/09/22 ([facility]) Scr: 1.19 (11/14/22)  Age: 82 Weight: 103kg  Appropriate dose. Refill sent.

## 2023-02-13 ENCOUNTER — Other Ambulatory Visit (INDEPENDENT_AMBULATORY_CARE_PROVIDER_SITE_OTHER): Payer: Medicare HMO

## 2023-02-13 DIAGNOSIS — I10 Essential (primary) hypertension: Secondary | ICD-10-CM | POA: Diagnosis not present

## 2023-02-13 LAB — CBC WITH DIFFERENTIAL/PLATELET
Basophils Absolute: 0 10*3/uL (ref 0.0–0.1)
Basophils Relative: 0.6 % (ref 0.0–3.0)
Eosinophils Absolute: 0.1 10*3/uL (ref 0.0–0.7)
Eosinophils Relative: 1.6 % (ref 0.0–5.0)
HCT: 39.3 % (ref 39.0–52.0)
Hemoglobin: 13.6 g/dL (ref 13.0–17.0)
Lymphocytes Relative: 46.2 % — ABNORMAL HIGH (ref 12.0–46.0)
Lymphs Abs: 1.9 10*3/uL (ref 0.7–4.0)
MCHC: 34.5 g/dL (ref 30.0–36.0)
MCV: 92 fl (ref 78.0–100.0)
Monocytes Absolute: 0.5 10*3/uL (ref 0.1–1.0)
Monocytes Relative: 12.2 % — ABNORMAL HIGH (ref 3.0–12.0)
Neutro Abs: 1.6 10*3/uL (ref 1.4–7.7)
Neutrophils Relative %: 39.4 % — ABNORMAL LOW (ref 43.0–77.0)
Platelets: 148 10*3/uL — ABNORMAL LOW (ref 150.0–400.0)
RBC: 4.28 Mil/uL (ref 4.22–5.81)
RDW: 13.4 % (ref 11.5–15.5)
WBC: 4.1 10*3/uL (ref 4.0–10.5)

## 2023-02-13 LAB — BASIC METABOLIC PANEL
BUN: 20 mg/dL (ref 6–23)
CO2: 30 mEq/L (ref 19–32)
Calcium: 9.9 mg/dL (ref 8.4–10.5)
Chloride: 98 mEq/L (ref 96–112)
Creatinine, Ser: 1.23 mg/dL (ref 0.40–1.50)
GFR: 54.59 mL/min — ABNORMAL LOW (ref >60.00)
Glucose, Bld: 105 mg/dL — ABNORMAL HIGH (ref 70–99)
Potassium: 4.5 mEq/L (ref 3.5–5.1)
Sodium: 137 mEq/L (ref 135–145)

## 2023-02-23 DIAGNOSIS — C4441 Basal cell carcinoma of skin of scalp and neck: Secondary | ICD-10-CM | POA: Diagnosis not present

## 2023-02-23 DIAGNOSIS — D485 Neoplasm of uncertain behavior of skin: Secondary | ICD-10-CM | POA: Diagnosis not present

## 2023-02-23 DIAGNOSIS — C4442 Squamous cell carcinoma of skin of scalp and neck: Secondary | ICD-10-CM | POA: Diagnosis not present

## 2023-03-03 ENCOUNTER — Ambulatory Visit: Payer: Medicare HMO | Admitting: Internal Medicine

## 2023-03-07 ENCOUNTER — Ambulatory Visit (INDEPENDENT_AMBULATORY_CARE_PROVIDER_SITE_OTHER): Payer: Medicare HMO | Admitting: *Deleted

## 2023-03-07 DIAGNOSIS — Z0000 Encounter for general adult medical examination without abnormal findings: Secondary | ICD-10-CM | POA: Diagnosis not present

## 2023-03-07 NOTE — Patient Instructions (Signed)
Philip [name] , Thank you for taking time to come for your Medicare Wellness Visit. I appreciate your ongoing commitment to your health goals. Please review the following plan we discussed and let me know if I can assist you in the future.     This is a list of the screening recommended for you and due dates:  Health Maintenance  Topic Date Due   Flu Shot  06/01/2023   Medicare Annual Wellness Visit  03/06/2024   DTaP/Tdap/Td vaccine (2 - Tdap) 04/22/2024   Pneumonia Vaccine  Completed   Zoster (Shingles) Vaccine  Completed   HPV Vaccine  Aged Out   COVID-19 Vaccine  Discontinued    Next appointment: Follow up in one year for your annual wellness visit.   Preventive Care 34 Years and Older, Male Preventive care refers to lifestyle choices and visits with your health care provider that can promote health and wellness. What does preventive care include? A yearly physical exam. This is also called an annual well check. Dental exams once or twice a year. Routine eye exams. Ask your health care provider how often you should have your eyes checked. Personal lifestyle choices, including: Daily care of your teeth and gums. Regular physical activity. Eating a healthy diet. Avoiding tobacco and drug use. Limiting alcohol use. Practicing safe sex. Taking low doses of aspirin every day. Taking vitamin and mineral supplements as recommended by your health care provider. What happens during an annual well check? The services and screenings done by your health care provider during your annual well check will depend on your age, overall health, lifestyle risk factors, and family history of disease. Counseling  Your health care provider may ask you questions about your: Alcohol use. Tobacco use. Drug use. Emotional well-being. Home and relationship well-being. Sexual activity. Eating habits. History of falls. Memory and ability to understand (cognition). Work and work  Astronomer. Screening  You may have the following tests or measurements: Height, weight, and BMI. Blood pressure. Lipid and cholesterol levels. These may be checked every 5 years, or more frequently if you are over 6 years old. Skin check. Lung cancer screening. You may have this screening every year starting at age 83 if you have a 30-pack-year history of smoking and currently smoke or have quit within the past 15 years. Fecal occult blood test (FOBT) of the stool. You may have this test every year starting at age 83. Flexible sigmoidoscopy or colonoscopy. You may have a sigmoidoscopy every 5 years or a colonoscopy every 10 years starting at age 83. Prostate cancer screening. Recommendations will vary depending on your family history and other risks. Hepatitis C blood test. Hepatitis B blood test. Sexually transmitted disease (STD) testing. Diabetes screening. This is done by checking your blood sugar (glucose) after you have not eaten for a while (fasting). You may have this done every 1-3 years. Abdominal aortic aneurysm (AAA) screening. You may need this if you are a current or former smoker. Osteoporosis. You may be screened starting at age 83 if you are at high risk. Talk with your health care provider about your test results, treatment options, and if necessary, the need for more tests. Vaccines  Your health care provider may recommend certain vaccines, such as: Influenza vaccine. This is recommended every year. Tetanus, diphtheria, and acellular pertussis (Tdap, Td) vaccine. You may need a Td booster every 10 years. Zoster vaccine. You may need this after age 83. Pneumococcal 13-valent conjugate (PCV13) vaccine. One dose is recommended after  age 83. Pneumococcal polysaccharide (PPSV23) vaccine. One dose is recommended after age 83. Talk to your health care provider about which screenings and vaccines you need and how often you need them. This information is not intended to replace  advice given to you by your health care provider. Make sure you discuss any questions you have with your health care provider. Document Released: 11/13/2015 Document Revised: 07/06/2016 Document Reviewed: 08/18/2015 Elsevier Interactive Patient Education  2017 [employer] Prevention in the Home Falls can cause injuries. They can happen to people of all ages. There are many things you can do to make your home safe and to help prevent falls. What can I do on the outside of my home? Regularly fix the edges of walkways and driveways and fix any cracks. Remove anything that might make you trip as you walk through a door, such as a raised step or threshold. Trim any bushes or trees on the path to your home. Use bright outdoor lighting. Clear any walking paths of anything that might make someone trip, such as rocks or tools. Regularly check to see if handrails are loose or broken. Make sure that both sides of any steps have handrails. Any raised decks and porches should have guardrails on the edges. Have any leaves, snow, or ice cleared regularly. Use sand or salt on walking paths during winter. Clean up any spills in your garage right away. This includes oil or grease spills. What can I do in the bathroom? Use night lights. Install grab bars by the toilet and in the tub and shower. Do not use towel bars as grab bars. Use non-skid mats or decals in the tub or shower. If you need to sit down in the shower, use a plastic, non-slip stool. Keep the floor dry. Clean up any water that spills on the floor as soon as it happens. Remove soap buildup in the tub or shower regularly. Attach bath mats securely with double-sided non-slip rug tape. Do not have throw rugs and other things on the floor that can make you trip. What can I do in the bedroom? Use night lights. Make sure that you have a light by your bed that is easy to reach. Do not use any sheets or blankets that are too big for your bed.  They should not hang down onto the floor. Have a firm chair that has side arms. You can use this for support while you get dressed. Do not have throw rugs and other things on the floor that can make you trip. What can I do in the kitchen? Clean up any spills right away. Avoid walking on wet floors. Keep items that you use a lot in easy-to-reach places. If you need to reach something above you, use a strong step stool that has a grab bar. Keep electrical cords out of the way. Do not use floor polish or wax that makes floors slippery. If you must use wax, use non-skid floor wax. Do not have throw rugs and other things on the floor that can make you trip. What can I do with my stairs? Do not leave any items on the stairs. Make sure that there are handrails on both sides of the stairs and use them. Fix handrails that are broken or loose. Make sure that handrails are as long as the stairways. Check any carpeting to make sure that it is firmly attached to the stairs. Fix any carpet that is loose or worn. Avoid having throw rugs  at the top or bottom of the stairs. If you do have throw rugs, attach them to the floor with carpet tape. Make sure that you have a light switch at the top of the stairs and the bottom of the stairs. If you do not have them, ask someone to add them for you. What else can I do to help prevent falls? Wear shoes that: Do not have high heels. Have rubber bottoms. Are comfortable and fit you well. Are closed at the toe. Do not wear sandals. If you use a stepladder: Make sure that it is fully opened. Do not climb a closed stepladder. Make sure that both sides of the stepladder are locked into place. Ask someone to hold it for you, if possible. Clearly mark and make sure that you can see: Any grab bars or handrails. First and last steps. Where the edge of each step is. Use tools that help you move around (mobility aids) if they are needed. These  include: Canes. Walkers. Scooters. Crutches. Turn on the lights when you go into a dark area. Replace any light bulbs as soon as they burn out. Set up your furniture so you have a clear path. Avoid moving your furniture around. If any of your floors are uneven, fix them. If there are any pets around you, be aware of where they are. Review your medicines with your doctor. Some medicines can make you feel dizzy. This can increase your chance of falling. Ask your doctor what other things that you can do to help prevent falls. This information is not intended to replace advice given to you by your health care provider. Make sure you discuss any questions you have with your health care provider. Document Released: 08/13/2009 Document Revised: 03/24/2016 Document Reviewed: 11/21/2014 Elsevier Interactive Patient Education  2017 [employer].

## 2023-03-07 NOTE — Progress Notes (Signed)
Subjective:   Philip [name] is a 82 y.o. male who presents for Medicare Annual/Subsequent preventive examination.  I connected with  Philip [name] on 03/07/23 by a audio enabled telemedicine application and verified that I am speaking with the correct person using two identifiers.  Patient Location: Home  Provider Location: Office/Clinic  I discussed the limitations of evaluation and management by telemedicine. The patient expressed understanding and agreed to proceed.   Review of Systems     Cardiac Risk Factors include: advanced age (>22men, >58 women);male gender;dyslipidemia;hypertension     Objective:    There were no vitals filed for this visit. There is no height or weight on file to calculate BMI.     03/07/2023    3:37 PM 02/28/2022    3:32 PM 01/28/2022   12:09 PM 10/06/2021   12:55 PM 08/05/2020    3:46 PM 05/27/2020    9:21 AM 05/06/2020   10:00 AM  Advanced Directives  Does Patient Have a Medical Advance Directive? Yes Yes Yes Yes Yes Yes Yes  Type of Estate agent of [place];Living will Healthcare Power of [place];Living will Healthcare Power of [place];Living will Living will;Healthcare Power of [employer] Power of [place];Living will  Healthcare Power of [place];Living will  Does patient want to make changes to medical advance directive? No - Patient declined    No - Patient declined Yes (ED - send information to MyChart) No - Patient declined  Copy of Healthcare Power of Attorney in Chart? Yes - validated most recent copy scanned in chart (See row information) Yes - validated most recent copy scanned in chart (See row information)  No - copy requested, Physician notified No - copy requested  Yes - validated most recent copy scanned in chart (See row information)  Would patient like information on creating a medical advance directive?    No - Patient declined       Current Medications (verified) Outpatient Encounter Medications as  of 03/07/2023  Medication Sig   amLODipine (NORVASC) 10 MG tablet TAKE 1 TABLET EVERY NIGHT   Ascorbic Acid (VITAMIN C ER PO) Take 500 mg by mouth daily.   atorvastatin (LIPITOR) 40 MG tablet TAKE 1 TABLET AT BEDTIME   B Complex-Biotin-FA (B-COMPLEX PO) Take 1 tablet by mouth daily.   benzonatate (TESSALON) 200 MG capsule Take 1 capsule (200 mg total) by mouth 3 (three) times daily as needed for cough.   co-enzyme Q-10 30 MG capsule Take 30 mg by mouth daily.   ELIQUIS 5 MG TABS tablet TAKE 1 TABLET TWICE DAILY   hydrocortisone 2.5 % cream Apply 1 application topically as needed (skin irritation).   losartan-hydrochlorothiazide (HYZAAR) 50-12.5 MG tablet Take 1 tablet by mouth daily.   magnesium oxide (MAG-OX) 400 MG tablet Take 800 mg by mouth daily.   Multiple Vitamins-Minerals (MULTIVITAMIN WITH MINERALS) tablet Take 1 tablet by mouth daily.   Omega-3 Fatty Acids (OMEGA-3 FISH OIL) 1200 MG CAPS Take 2 capsules (2,400 mg total) by mouth daily. Take 2 1,200 mg capsules daily   polyethylene glycol (MIRALAX / GLYCOLAX) packet Take 17 g by mouth daily.   No facility-administered encounter medications on file as of 03/07/2023.    Allergies (verified) Patient has no known allergies.   History: Past Medical History:  Diagnosis Date   AAA (abdominal aortic aneurysm) (HCC)    Arthritis    Atrial fibrillation (HCC)    CAD (coronary artery disease)    had a MI , s/p  CABG   Cataracts, bilateral    immature   CHF (congestive heart failure) (HCC)    Dysrhythmia    paroximal a fib   GERD (gastroesophageal reflux disease)    takes Omeprazole daily   History of colon polyps    History of kidney stones    History of shingles    Hyperlipidemia    Hypertension    takes Amlodipine,Metoprolol,and Lisinopril daily   Joint pain    Joint swelling    Myocardial infarction [facility])    several with last one being 2001(but never knew about any except 2001)   OSA (obstructive sleep apnea)    started  CPAP 12/09   Peripheral vascular disease (HCC)    Peyronie's disease    s/p penile implant in 2006   Pneumonia    as a child   Skin cancer    several , one of them was melanoma (sees derm routinely)   Sleep apnea    Tingling    feet occasionally   Tinnitus    Past Surgical History:  Procedure Laterality Date   ABDOMINAL AORTAGRAM N/A 06/16/2014   Procedure: ABDOMINAL Ronny Flurry;  Surgeon: Chuck Hint, MD;  Location: [facility] CATH LAB;  Service: Cardiovascular;  Laterality: N/A;   ABDOMINAL AORTIC ENDOVASCULAR STENT GRAFT N/A 07/08/2014   Procedure: ABDOMINAL AORTIC ENDOVASCULAR STENT GRAFT/ GORE;  Surgeon: Chuck Hint, MD;  Location[facility] OR;  Service: Vascular;  Laterality: N/A;   CARDIAC CATHETERIZATION  10/31/2013   COLONOSCOPY     CORONARY ARTERY BYPASS GRAFT  10/31/1997   x 3 vessels   LEFT HEART CATH AND CORS/GRAFTS ANGIOGRAPHY N/A 10/07/2021   Procedure: LEFT HEART CATH AND CORS/GRAFTS ANGIOGRAPHY;  Surgeon: Kathleene Hazel, MD;  Location: [facility] INVASIVE CV LAB;  Service: Cardiovascular;  Laterality: N/A;   LEFT HEART CATHETERIZATION WITH CORONARY/GRAFT ANGIOGRAM N/A 05/29/2014   Procedure: LEFT HEART CATHETERIZATION WITH Isabel Caprice;  Surgeon: Laurey Morale, MD;  Location: [facility] CATH LAB;  Service: Cardiovascular;  Laterality: N/A;   PENILE PROSTHESIS IMPLANT  08/31/2005   AMS inflatable penile prosthesis   RHINOPLASTY  10/31/1973   Family History  Problem Relation Age of Onset   Prostate cancer Father 27   Heart disease Father    Skin cancer Father    Heart attack Father    Cancer Father    Deep vein thrombosis Father    Hyperlipidemia Father    Hypertension Father    Heart disease Mother    Skin cancer Mother    Heart attack Mother    Cancer Mother    Hyperlipidemia Mother    Hypertension Mother    Varicose Veins Mother    Peripheral vascular disease Mother        amputation   Sleep apnea Brother    Hyperlipidemia Brother     Hypertension Brother    Prostate cancer Brother 67   Colon cancer Other        aunt   Skin cancer Brother    Skin cancer Sister    Cancer Sister    Heart disease Sister    Diabetes Neg Hx    Stroke Neg Hx    Social History   Socioeconomic History   Marital status: Married    Spouse name: Not on file   Number of children: 3   Years of education: 12   Highest education level: 12th grade  Occupational History   Occupation: retired, Research officer, political party  Tobacco Use   Smoking status: Former  Types: Cigarettes    Quit date: 44    Years since quitting: 45.3   Smokeless tobacco: Never   Tobacco comments:    quit smoking in 1979  Vaping Use   Vaping Use: Never used  Substance and Sexual Activity   Alcohol use: Yes    Comment: occasionally - once per week   Drug use: No   Sexual activity: Not Currently  Other Topics Concern   Not on file  Social History Narrative   Lives w/ wife in a 2 story home.  Has one son and 2 stepchildren.  Retired for [employer].     Education: high school.   Social Determinants of Health   Financial Resource Strain: Low Risk  (01/25/2023)   Overall Financial Resource Strain (CARDIA)    Difficulty of Paying Living Expenses: Not hard at all  Food Insecurity: No Food Insecurity (01/25/2023)   Hunger Vital Sign    Worried About Running Out of Food in the Last Year: Never true    Ran Out of Food in the Last Year: Never true  Transportation Needs: No Transportation Needs (01/25/2023)   PRAPARE - Administrator, Civil Service (Medical): No    Lack of Transportation (Non-Medical): No  Physical Activity: Sufficiently Active (01/25/2023)   Exercise Vital Sign    Days of Exercise per Week: 5 days    Minutes of Exercise per Session: 90 min  Stress: No Stress Concern Present (01/25/2023)   [employer] of Occupational Health - Occupational Stress Questionnaire    Feeling of Stress : Only a little  Social Connections: Moderately Integrated  (01/25/2023)   Social Connection and Isolation Panel [NHANES]    Frequency of Communication with Friends and Family: More than three times a week    Frequency of Social Gatherings with Friends and Family: Twice a week    Attends Religious Services: Never    Database administrator or Organizations: Yes    Attends Engineer, structural: More than 4 times per year    Marital Status: Married    Tobacco Counseling Counseling given: Not Answered Tobacco comments: quit smoking in 1979   Clinical Intake:  Pre-visit preparation completed: Yes  Pain : No/denies pain  Nutritional Risks: None Diabetes: No  How often do you need to have someone help you when you read instructions, pamphlets, or other written materials from your doctor or pharmacy?: 1 - Never   Activities of Daily Living    03/07/2023    3:39 PM  In your present state of health, do you have any difficulty performing the following activities:  Hearing? 1  Comment hearing loss, tinnitus  Vision? 0  Difficulty concentrating or making decisions? 0  Walking or climbing stairs? 0  Dressing or bathing? 0  Doing errands, shopping? 0  Preparing Food and eating ? N  Using the Toilet? N  In the past six months, have you accidently leaked urine? N  Do you have problems with loss of bowel control? N  Managing your Medications? N  Managing your Finances? N  Housekeeping or managing your Housekeeping? N    Patient Care Team: Wanda Plump, MD as PCP - General (Internal Medicine) Corky Crafts, MD as PCP - Cardiology (Cardiology) Crist Fat, MD as Attending Physician (Urology) Chuck Hint, MD as Consulting Physician (Vascular Surgery) Oretha Milch, MD as Consulting Physician (Pulmonary Disease) Cherlyn Roberts, MD as Consulting Physician (Dermatology) Charlott Rakes, MD as Consulting Physician (Gastroenterology) Basilio Cairo,  Maralyn Sago, MD as Attending Physician (Radiation Oncology) [place],  Lise Auer, RN as Oncology Nurse Navigator  Indicate any recent Medical Services you may have received from other than [facility] providers in the past year (date may be approximate).     Assessment:   This is a routine wellness examination for Chan.  Hearing/Vision screen No results found.  Dietary issues and exercise activities discussed: Current Exercise Habits: Home exercise routine, Type of exercise: Other - see comments;strength training/weights (plays golf 2 days a week, elliptical), Time (Minutes): 60, Frequency (Times/Week): 3, Weekly Exercise (Minutes/Week): 180, Intensity: Mild, Exercise limited by: None identified   Goals Addressed   None    Depression Screen    03/07/2023    3:39 PM 01/30/2023    2:03 PM 10/28/2022    2:02 PM 04/27/2022    9:07 AM 02/28/2022    3:39 PM 01/21/2022    8:43 AM 08/23/2021    9:17 AM  PHQ 2/9 Scores  PHQ - 2 Score 0 0 0 0 0 1 1  PHQ- 9 Score   0   1 1    Fall Risk    03/07/2023    3:38 PM 01/30/2023    2:03 PM 10/28/2022    2:07 PM 04/27/2022    9:07 AM 02/28/2022    3:36 PM  Fall Risk   Falls in the past year? 0 0 0 0 0  Number falls in past yr: 0 0 0 0 0  Injury with Fall? 0 0 0  0  Risk for fall due to : No Fall Risks      Follow up Falls evaluation completed Falls evaluation completed Falls evaluation completed  Falls prevention discussed    FALL RISK PREVENTION PERTAINING TO THE HOME:  Any stairs in or around the home? Yes  If so, are there any without handrails? No  Home free of loose throw rugs in walkways, pet beds, electrical cords, etc? Yes  Adequate lighting in your home to reduce risk of falls? Yes   ASSISTIVE DEVICES UTILIZED TO PREVENT FALLS:  Life alert? No  Use of a cane, walker or w/c? No  Grab bars in the bathroom? No  Shower chair or bench in shower? Yes  Elevated toilet seat or a handicapped toilet? No   TIMED UP AND GO:  Was the test performed?  No, audio visit .    Cognitive Function:    12/06/2016     3:22 PM  MMSE - Mini Mental State Exam  Orientation to time 5  Orientation to Place 5  Registration 3  Attention/ Calculation 5  Recall 3  Language- name 2 objects 2  Language- repeat 1  Language- follow 3 step command 3  Language- read & follow direction 1  Write a sentence 1  Copy design 1  Total score 30        03/07/2023    3:44 PM  6CIT Screen  What Year? 0 points  What month? 0 points  What time? 0 points  Count back from 20 0 points  Months in reverse 0 points  Repeat phrase 0 points  Total Score 0 points    Immunizations Immunization History  Administered Date(s) Administered   Fluad Quad(high Dose 65+) 08/19/2020, 08/23/2021   H1N1 11/06/2008   Influenza Split 09/15/2011, 09/14/2012   Influenza Whole 08/06/2010   Influenza, High Dose Seasonal PF 09/16/2015, 09/26/2016, 09/01/2017, 09/03/2018, 08/29/2019   Influenza,inj,Quad PF,6+ Mos 10/01/2013   Influenza-Unspecified 08/31/2014   PFIZER(Purple Top)SARS-COV-2  Vaccination 12/07/2019, 01/01/2020   Pneumococcal Conjugate-13 04/22/2014   Pneumococcal Polysaccharide-23 01/01/2010, 10/18/2017   Td 04/22/2014   Zoster Recombinat (Shingrix) 09/04/2018, 03/04/2022   Zoster, Live 10/31/2012    TDAP status: Up to date  Flu Vaccine status: Up to date  Pneumococcal vaccine status: Up to date  Covid-19 vaccine status: Information provided on how to obtain vaccines.   Qualifies for Shingles Vaccine? Yes   Zostavax completed Yes   Shingrix Completed?: Yes  Screening Tests Health Maintenance  Topic Date Due   Medicare Annual Wellness (AWV)  03/01/2023   INFLUENZA VACCINE  06/01/2023   DTaP/Tdap/Td (2 - Tdap) 04/22/2024   Pneumonia Vaccine 28+ Years old  Completed   Zoster Vaccines- Shingrix  Completed   HPV VACCINES  Aged Out   COVID-19 Vaccine  Discontinued    Health Maintenance  Health Maintenance Due  Topic Date Due   Medicare Annual Wellness (AWV)  03/01/2023    Colorectal cancer screening: No  longer required.   Lung Cancer Screening: (Low Dose CT Chest recommended if Age 62-80 years, 30 pack-year currently smoking OR have quit w/in 15years.) does not qualify.    Additional Screening:  Hepatitis C Screening: does not qualify  Vision Screening: Recommended annual ophthalmology exams for early detection of glaucoma and other disorders of the eye. Is the patient up to date with their annual eye exam?  Yes  Who is the provider or what is the name of the office in which the patient attends annual eye exams? [facility] If pt is not established with a provider, would they like to be referred to a provider to establish care? No .   Dental Screening: Recommended annual dental exams for proper oral hygiene  Community Resource Referral / Chronic Care Management: CRR required this visit?  No   CCM required this visit?  No      Plan:     I have personally reviewed and noted the following in the patient's chart:   Medical and social history Use of alcohol, tobacco or illicit drugs  Current medications and supplements including opioid prescriptions. Patient is not currently taking opioid prescriptions. Functional ability and status Nutritional status Physical activity Advanced directives List of other physicians Hospitalizations, surgeries, and ER visits in previous 12 months Vitals Screenings to include cognitive, depression, and falls Referrals and appointments  In addition, I have reviewed and discussed with patient certain preventive protocols, quality metrics, and best practice recommendations. A written personalized care plan for preventive services as well as general preventive health recommendations were provided to patient.   Due to this being a telephonic visit, the after visit summary with patients personalized plan was offered to patient via mail or my-chart. Patient would like to access on my-chart.   Donne Anon, [place]   03/07/2023   Nurse Notes: None

## 2023-03-08 DIAGNOSIS — D485 Neoplasm of uncertain behavior of skin: Secondary | ICD-10-CM | POA: Diagnosis not present

## 2023-03-08 DIAGNOSIS — C4441 Basal cell carcinoma of skin of scalp and neck: Secondary | ICD-10-CM | POA: Diagnosis not present

## 2023-03-08 DIAGNOSIS — C44222 Squamous cell carcinoma of skin of right ear and external auricular canal: Secondary | ICD-10-CM | POA: Diagnosis not present

## 2023-03-08 DIAGNOSIS — C44319 Basal cell carcinoma of skin of other parts of face: Secondary | ICD-10-CM | POA: Diagnosis not present

## 2023-03-23 ENCOUNTER — Telehealth: Payer: Self-pay | Admitting: Internal Medicine

## 2023-03-23 DIAGNOSIS — D485 Neoplasm of uncertain behavior of skin: Secondary | ICD-10-CM | POA: Diagnosis not present

## 2023-03-23 DIAGNOSIS — C4441 Basal cell carcinoma of skin of scalp and neck: Secondary | ICD-10-CM | POA: Diagnosis not present

## 2023-03-23 MED ORDER — LOSARTAN POTASSIUM-HCTZ 50-12.5 MG PO TABS: 1.0000 | ORAL_TABLET | Freq: Every day | ORAL | 1 refills | Status: DC

## 2023-03-23 NOTE — Telephone Encounter (Signed)
Prescription Request  03/23/2023  Is this a "Controlled Substance" medicine? No  LOV: 01/30/2023  What is the name of the medication or equipment?  losartan-hydrochlorothiazide (HYZAAR) 50-12.5 MG tablet -- 90 day supply  Have you contacted your pharmacy to request a refill? No   Which pharmacy would you like this sent to?   [facility] - [place], [place] - [address] [place] [address] Phone: [phone] Fax: [phone]    Patient notified that their request is being sent to the clinical staff for review and that they should receive a response within 2 business days.   Please advise at Mobile [phone] (mobile)

## 2023-03-23 NOTE — Telephone Encounter (Signed)
Rx sent 

## 2023-03-23 NOTE — Addendum Note (Signed)
Addended byConrad Kingvale D on: 03/23/2023 08:13 AM   Modules accepted: Orders

## 2023-03-29 DIAGNOSIS — D225 Melanocytic nevi of trunk: Secondary | ICD-10-CM | POA: Diagnosis not present

## 2023-03-29 DIAGNOSIS — L814 Other melanin hyperpigmentation: Secondary | ICD-10-CM | POA: Diagnosis not present

## 2023-03-29 DIAGNOSIS — D492 Neoplasm of unspecified behavior of bone, soft tissue, and skin: Secondary | ICD-10-CM | POA: Diagnosis not present

## 2023-03-29 DIAGNOSIS — Z85828 Personal history of other malignant neoplasm of skin: Secondary | ICD-10-CM | POA: Diagnosis not present

## 2023-03-29 DIAGNOSIS — Z08 Encounter for follow-up examination after completed treatment for malignant neoplasm: Secondary | ICD-10-CM | POA: Diagnosis not present

## 2023-03-29 DIAGNOSIS — L570 Actinic keratosis: Secondary | ICD-10-CM | POA: Diagnosis not present

## 2023-03-29 DIAGNOSIS — C44329 Squamous cell carcinoma of skin of other parts of face: Secondary | ICD-10-CM | POA: Diagnosis not present

## 2023-03-29 DIAGNOSIS — L821 Other seborrheic keratosis: Secondary | ICD-10-CM | POA: Diagnosis not present

## 2023-04-14 ENCOUNTER — Other Ambulatory Visit: Payer: Self-pay | Admitting: Nurse Practitioner

## 2023-04-14 DIAGNOSIS — R5383 Other fatigue: Secondary | ICD-10-CM

## 2023-04-14 DIAGNOSIS — R0609 Other forms of dyspnea: Secondary | ICD-10-CM

## 2023-04-14 DIAGNOSIS — I2510 Atherosclerotic heart disease of native coronary artery without angina pectoris: Secondary | ICD-10-CM

## 2023-04-16 NOTE — Progress Notes (Unsigned)
Office Visit    Patient Name: Philip [name] Date of Encounter: 04/17/2023  Primary Care Provider:  Wanda Plump, MD Primary Cardiologist:  Lance Muss, MD Primary Electrophysiologist: None   Past Medical History    Past Medical History:  Diagnosis Date   AAA (abdominal aortic aneurysm) (HCC)    Arthritis    Atrial fibrillation (HCC)    CAD (coronary artery disease)    had a MI , s/p CABG   Cataracts, bilateral    immature   CHF (congestive heart failure) (HCC)    Dysrhythmia    paroximal a fib   GERD (gastroesophageal reflux disease)    takes Omeprazole daily   History of colon polyps    History of kidney stones    History of shingles    Hyperlipidemia    Hypertension    takes Amlodipine,Metoprolol,and Lisinopril daily   Joint pain    Joint swelling    Myocardial infarction (HCC)    several with last one being 2001(but never knew about any except 2001)   OSA (obstructive sleep apnea)    started CPAP 12/09   Peripheral vascular disease (HCC)    Peyronie's disease    s/p penile implant in 2006   Pneumonia    as a child   Skin cancer    several , one of them was melanoma (sees derm routinely)   Sleep apnea    Tingling    feet occasionally   Tinnitus    Past Surgical History:  Procedure Laterality Date   ABDOMINAL AORTAGRAM N/A 06/16/2014   Procedure: ABDOMINAL Ronny Flurry;  Surgeon: Chuck Hint, MD;  Location: [facility] CATH LAB;  Service: Cardiovascular;  Laterality: N/A;   ABDOMINAL AORTIC ENDOVASCULAR STENT GRAFT N/A 07/08/2014   Procedure: ABDOMINAL AORTIC ENDOVASCULAR STENT GRAFT/ GORE;  Surgeon: Chuck Hint, MD;  Location: [facility] OR;  Service: Vascular;  Laterality: N/A;   CARDIAC CATHETERIZATION  10/31/2013   COLONOSCOPY     CORONARY ARTERY BYPASS GRAFT  10/31/1997   x 3 vessels   LEFT HEART CATH AND CORS/GRAFTS ANGIOGRAPHY N/A 10/07/2021   Procedure: LEFT HEART CATH AND CORS/GRAFTS ANGIOGRAPHY;  Surgeon: Kathleene Hazel, MD;   Location: [facility] INVASIVE CV LAB;  Service: Cardiovascular;  Laterality: N/A;   LEFT HEART CATHETERIZATION WITH CORONARY/GRAFT ANGIOGRAM N/A 05/29/2014   Procedure: LEFT HEART CATHETERIZATION WITH Isabel Caprice;  Surgeon: Laurey Morale, MD;  Location: [facility] CATH LAB;  Service: Cardiovascular;  Laterality: N/A;   PENILE PROSTHESIS IMPLANT  08/31/2005   AMS inflatable penile prosthesis   RHINOPLASTY  10/31/1973    Allergies  No Known Allergies   History of Present Illness    Philip [name]  is a 82 year old male with a PMH of CAD s/p CABG x 3 1999, HTN, OSA (on CPAP), HLD, PAD, AAA s/p endovascular repair 2015, PAF (on Eliquis), CKD stage III who presents today for follow-up.  Mr. Philip [name] was seen initially by Dr. Shirlee Latch in 2014 to establish care due to history of CAD and complained of generalized fatigue.  He underwent LHC he underwent CABG x 3 in 1999 and underwent LHC in 2009 that showed patent grafts.  He developed a AAA (7 cm) and underwent surgical repair by Dr. Edilia [name] in 05/2014.  He underwent a repeat Myoview perfusion scan in 2018 that showed stable anatomy.  2D echo was completed in 2019 that showed normal EF with grade 1 DD and moderate LAE with mild LV.  He was  seen in the ED via EMS on 10/06/2021 with complaint of chest pain.  EKG was completed and troponins were found to be mildly elevated.  He underwent repeat LHC that showed patent grafts with chronic occlusion of distal left main and proximal RCA.  He was recommended to continue medical therapy at that time.  He was last seen by Dr. Edilia [name] on 06/23/2022 for follow-up and review of annual CT angio of the abdomen.  Mr. Philip [name] presents today for a 1 year follow-up alone.  Since last being seen in the office patient reports he has been doing well from a cardiac perspective.  His blood pressure today is slightly elevated 142/60 but is controlled at home.  He is compliant with his current medication regimen and denies any adverse  reactions.  He is staying active and participates at the gym on the elliptical 3 days a week.  He is also golfing twice a week when weather permits.  He does report having some ongoing cramping in his left thigh that occurs with activity and is relieved with rest.  He is currently followed by [facility] and is planning to have a follow-up appointment with them soon.  He has been compliant with his CPAP otherwise denies any new complaints or concerns..  Patient denies chest pain, palpitations, dyspnea, PND, orthopnea, nausea, vomiting, dizziness, syncope, edema, weight gain, or early satiety.  Home Medications    Current Outpatient Medications  Medication Sig Dispense Refill   amLODipine (NORVASC) 10 MG tablet TAKE 1 TABLET EVERY NIGHT 90 tablet 2   Ascorbic Acid (VITAMIN C ER PO) Take 500 mg by mouth daily.     atorvastatin (LIPITOR) 40 MG tablet TAKE 1 TABLET AT BEDTIME 90 tablet 0   B Complex-Biotin-FA (B-COMPLEX PO) Take 1 tablet by mouth daily.     co-enzyme Q-10 30 MG capsule Take 30 mg by mouth daily.     ELIQUIS 5 MG TABS tablet TAKE 1 TABLET TWICE DAILY 180 tablet 3   hydrocortisone 2.5 % cream Apply 1 application topically as needed (skin irritation).     losartan-hydrochlorothiazide (HYZAAR) 50-12.5 MG tablet Take 1 tablet by mouth daily. 90 tablet 1   magnesium oxide (MAG-OX) 400 MG tablet Take 800 mg by mouth daily.     Multiple Vitamins-Minerals (MULTIVITAMIN WITH MINERALS) tablet Take 1 tablet by mouth daily.     Omega-3 Fatty Acids (OMEGA-3 FISH OIL) 1200 MG CAPS Take 2 capsules (2,400 mg total) by mouth daily. Take 2 1,200 mg capsules daily 90 capsule 3   polyethylene glycol (MIRALAX / GLYCOLAX) packet Take 17 g by mouth daily.     No current facility-administered medications for this visit.     Review of Systems  Please see the history of present illness.    (+) Left thigh discomfort (+) Claudication  All other systems reviewed and are otherwise negative except as noted  above.  Physical Exam    Wt Readings from Last 3 Encounters:  04/17/23 222 lb (100.7 kg)  01/30/23 227 lb 2 oz (103 kg)  12/05/22 225 lb 12.8 oz (102.4 kg)   VS: Vitals:   04/17/23 0921  BP: (!) 142/60  Pulse: 68  SpO2: 96%  ,Body mass index is 32.78 kg/m.  Constitutional:      Appearance: Healthy appearance. Not in distress.  Neck:     Vascular: JVD normal.  Pulmonary:     Effort: Pulmonary effort is normal.     Breath sounds: No wheezing. No rales. Diminished in  the bases Cardiovascular:     Normal rate. Regular rhythm. Normal S1. Normal S2.      Murmurs: There is no murmur.  Edema:    Peripheral edema absent.  Abdominal:     Palpations: Abdomen is soft non tender. There is no hepatomegaly.  Skin:    General: Skin is warm and dry.  Neurological:     General: No focal deficit present.     Mental Status: Alert and oriented to person, place and time.     Cranial Nerves: Cranial nerves are intact.  EKG/LABS/ Recent Cardiac Studies    ECG personally reviewed by me today -sinus rhythm with first-degree AVB and PVC with LVH and no acute changes consistent with previous EKG.  Cardiac Studies & Procedures   CARDIAC CATHETERIZATION  CARDIAC CATHETERIZATION 10/07/2021  Narrative   Prox RCA lesion is 100% stenosed.   Ost Cx to Prox Cx lesion is 100% stenosed.   Dist LM to Prox LAD lesion is 100% stenosed.   SVG graft was visualized by angiography and is normal in caliber.   SVG graft was visualized by angiography and is normal in caliber.   LIMA graft was visualized by angiography and is normal in caliber.   The graft exhibits no disease.  Severe three vessel CAD Chronic occlusion of the distal left main artery and the proximal RCA Patent LIMA to LAD Patent SVG to OM Patent SVG to PDA  Recommendations: No culprit lesion is found. Continue medical management of CAD.  Findings Coronary Findings Diagnostic  Dominance: Right  Left Main Dist LM to Prox LAD lesion  is 100% stenosed. The lesion is chronically occluded.  Left Anterior Descending Vessel is large.  Left Circumflex Vessel is large. Ost Cx to Prox Cx lesion is 100% stenosed. The lesion is chronically occluded.  Right Coronary Artery Prox RCA lesion is 100% stenosed. The lesion is chronically occluded.  Saphenous Graft To RPAV SVG graft was visualized by angiography and is normal in caliber.  The graft exhibits no disease.  Saphenous Graft To 2nd Mrg SVG graft was visualized by angiography and is normal in caliber.  LIMA LIMA Graft To Dist LAD LIMA graft was visualized by angiography and is normal in caliber.  Intervention  No interventions have been documented.   STRESS TESTS  MYOCARDIAL PERFUSION IMAGING 01/12/2017  Narrative  Nuclear stress EF: 47%.  The left ventricular ejection fraction is mildly decreased (45-54%).  Blood pressure demonstrated a hypertensive response to exercise.  There was no ST segment deviation noted during stress.  There is a small defect of mild severity present in the mid anteroseptal location. The defect is non-reversible and consistent with attenuation artifact of scar.  There is a small defect of mild severity present in the mid anterior location. The defect is reversible and consistent with ischemia in the diagonal territory.  This is an intermediate risk study.  Compared to study of 2015, the apical defect has resolved and there is no change in the anterior/anteroseptal defect.   ECHOCARDIOGRAM  ECHOCARDIOGRAM COMPLETE 10/07/2021  Narrative ECHOCARDIOGRAM REPORT    Patient Name:   Philip [name] Snooks Date of Exam: 10/07/2021 Medical Rec #:  [id number]      Height:       69.0 in Accession #:    [id number]     Weight:       210.0 lb Date of Birth:  1941-09-02      BSA:          2.109  m Patient Age:    80 years       BP:           150/78 mmHg Patient Gender: M              HR:           85 bpm. Exam Location:  Inpatient  Procedure:  2D Echo, Cardiac Doppler and Color Doppler  Indications:    Elevated Troponin  History:        Patient has prior history of Echocardiogram examinations, most recent 01/05/2018. CHF, Previous Myocardial Infarction and CAD, Arrythmias:Atrial Fibrillation; Risk Factors:Hypertension and Dyslipidemia.  Sonographer:    Eulah Pont RDCS Referring Phys: Kenn File DOUTOVA   Sonographer Comments: No subcostal window. IMPRESSIONS   1. Left ventricular ejection fraction, by estimation, is 55 to 60%. The left ventricle has normal function. The LV endocardium is incompletely visualized, however, no regional wall motion abnormalities seen on limited views. There is moderate hypertrophy of the basal septum. The rest of the LV segments demonstrate mild left ventricular hypertrophy. Left ventricular diastolic parameters are consistent with Grade I diastolic dysfunction (impaired relaxation). 2. Right ventricular systolic function is normal. The right ventricular size is normal. Tricuspid regurgitation signal is inadequate for assessing PA pressure. 3. Left atrial size was mild to moderately dilated. 4. The mitral valve is degenerative. Trivial mitral valve regurgitation. Severe mitral annular calcification most notably on the posterior MV annulus. 5. The aortic valve is tricuspid. There is mild calcification of the aortic valve. There is mild thickening of the aortic valve. Aortic valve regurgitation is trivial. Aortic valve sclerosis is present, with no evidence of aortic valve stenosis. 6. Aortic dilatation noted. There is mild dilatation of the ascending aorta, measuring 39 mm.  Comparison(s): Compared to prior TTE in 2019, there is no significant change.  FINDINGS Left Ventricle: Left ventricular ejection fraction, by estimation, is 55 to 60%. The left ventricle has normal function. The left ventricle has no regional wall motion abnormalities. The left ventricular internal cavity size was normal  in size. There is moderate hypertrophy of the basal septum. The rest of the LV segments demonstrate mild left ventricular hypertrophy. Left ventricular diastolic parameters are consistent with Grade I diastolic dysfunction (impaired relaxation).  Right Ventricle: The right ventricular size is normal. No increase in right ventricular wall thickness. Right ventricular systolic function is normal. Tricuspid regurgitation signal is inadequate for assessing PA pressure.  Left Atrium: Left atrial size was mild to moderately dilated.  Right Atrium: Right atrial size was normal in size.  Pericardium: There is no evidence of pericardial effusion.  Mitral Valve: The mitral valve is degenerative in appearance. There is mild thickening of the mitral valve leaflet(s). There is moderate calcification of the mitral valve leaflet(s). Severe mitral annular calcification. Trivial mitral valve regurgitation.  Tricuspid Valve: The tricuspid valve is normal in structure. Tricuspid valve regurgitation is trivial.  Aortic Valve: The aortic valve is tricuspid. There is mild calcification of the aortic valve. There is mild thickening of the aortic valve. Aortic valve regurgitation is trivial. Aortic valve sclerosis is present, with no evidence of aortic valve stenosis.  Pulmonic Valve: The pulmonic valve was normal in structure. Pulmonic valve regurgitation is trivial.  Aorta: Aortic dilatation noted. There is mild dilatation of the ascending aorta, measuring 39 mm.  Venous: The inferior vena cava was not well visualized.  IAS/Shunts: No atrial level shunt detected by color flow Doppler.   LEFT VENTRICLE PLAX 2D LVIDd:  5.94 cm      Diastology LVIDs:         4.50 cm      LV e' medial:    5.77 cm/s LV PW:         1.24 cm      LV E/e' medial:  23.4 LV IVS:        1.24 cm      LV e' lateral:   6.96 cm/s LVOT diam:     2.20 cm      LV E/e' lateral: 19.4 LV SV:         88 LV SV Index:   42 LVOT Area:      3.80 cm  LV Volumes (MOD) LV vol d, MOD A2C: 106.0 ml LV vol d, MOD A4C: 158.0 ml LV vol s, MOD A2C: 59.5 ml LV vol s, MOD A4C: 77.7 ml LV SV MOD A2C:     46.5 ml LV SV MOD A4C:     158.0 ml LV SV MOD BP:      64.7 ml  RIGHT VENTRICLE RV S prime:     7.53 cm/s TAPSE (M-mode): 1.5 cm  LEFT ATRIUM             Index        RIGHT ATRIUM           Index LA diam:        4.50 cm 2.13 cm/m   RA Area:     11.10 cm LA Vol (A2C):   77.9 ml 36.93 ml/m  RA Volume:   20.60 ml  9.77 ml/m LA Vol (A4C):   76.8 ml 36.41 ml/m LA Biplane Vol: 79.1 ml 37.50 ml/m AORTIC VALVE LVOT Vmax:   112.00 cm/s LVOT Vmean:  72.800 cm/s LVOT VTI:    0.232 m  AORTA Ao Root diam: 3.30 cm Ao Asc diam:  3.90 cm  MITRAL VALVE MV Area (PHT): 4.06 cm     SHUNTS MV Decel Time: 187 msec     Systemic VTI:  0.23 m MV E velocity: 135.00 cm/s  Systemic Diam: 2.20 cm MV A velocity: 176.00 cm/s MV E/A ratio:  0.77  Laurance Flatten MD Electronically signed by Laurance Flatten MD Signature Date/Time: 10/07/2021/11:21:09 AM    Final    MONITORS  CARDIAC EVENT MONITOR 11/19/2021  Narrative  Normal sinus rhythm with one episode of Atrial fibrillationwith RVR lasting 19 minutes.  Bradycardia noted during sleep.  No pauses > 3 seconds noted.           Risk Assessment/Calculations:    CHA2DS2-VASc Score = 4   This indicates a 4.8% annual risk of stroke. The patient's score is based upon: CHF History: 0 HTN History: 1 Diabetes History: 0 Stroke History: 0 Vascular Disease History: 1 Age Score: 2 Gender Score: 0           Lab Results  Component Value Date   WBC 4.1 02/13/2023   HGB 13.6 02/13/2023   HCT 39.3 02/13/2023   MCV 92.0 02/13/2023   PLT 148.0 (L) 02/13/2023   Lab Results  Component Value Date   CREATININE 1.23 02/13/2023   BUN 20 02/13/2023   NA 137 02/13/2023   K 4.5 02/13/2023   CL 98 02/13/2023   CO2 30 02/13/2023   Lab Results  Component Value Date   ALT  22 10/07/2021   AST 31 10/07/2021   ALKPHOS 58 10/07/2021   BILITOT 0.6 10/07/2021   Lab Results  Component  Value Date   CHOL 132 04/27/2022   HDL 51.20 04/27/2022   LDLCALC 57 04/27/2022   LDLDIRECT 68.0 04/13/2015   TRIG 119.0 04/27/2022   CHOLHDL 3 04/27/2022    Lab Results  Component Value Date   HGBA1C 5.9 (H) 10/28/2022     Assessment & Plan    1.  Coronary artery disease: -s/p CABG x 3 1999 with last LHC completed 09/2021 showing patent grafts and medical management -Today patient reports that he is doing well and not experiencing any cardiac changes since his previous visit. Continue GDMT with Lipitor 40 mg daily, Toprol-XL 25 mg daily, omega-3 fish oil 1200 mg twice daily -Continue secondary prevention with physical activity and diet.  2.  Essential hypertension: -Patient's blood pressure today is slightly elevated at 142/60 but is controlled at home. -Patient will continue to monitor and contact office if blood pressures remain elevated. -Continue Norvasc 10 mg daily and Hyzaar 50-12.5 mg daily  3.  Abdominal aortic aneurysm: -s/p endovascular repair 2015 by Dr. Edilia [name] -Patient reports intermittent claudication in left thigh and is scheduled to follow-up with [facility]  4.  Hyperlipidemia: -Patient's LDL cholesterol was controlled at 57 -Continue Lipitor 40 mg daily  5.  Paroxysmal AF: -Patient is sinus rhythm with occasional PVCs today.  He is currently rate controlled with Toprol 25 mg daily -Patient creatinine was 1.2 and hemoglobin was 13.6 -Continue Eliquis 5 mg twice daily -CHA2DS2-VASc Score = 4 [CHF History: 0, HTN History: 1, Diabetes History: 0, Stroke History: 0, Vascular Disease History: 1, Age Score: 2, Gender Score: 0].  Therefore, the patient's annual risk of stroke is 4.8 %.      6.  History of sleep apnea: -Patient reports nightly compliance with CPAP  Disposition: Follow-up with Lance Muss, MD or APP in 12 months    Medication  Adjustments/Labs and Tests Ordered: Current medicines are reviewed at length with the patient today.  Concerns regarding medicines are outlined above.   Signed, Napoleon Form, Leodis Rains, NP 04/17/2023, 12:05 PM [facility]

## 2023-04-17 ENCOUNTER — Encounter: Payer: Self-pay | Admitting: Nurse Practitioner

## 2023-04-17 ENCOUNTER — Ambulatory Visit: Payer: Medicare HMO | Attending: Nurse Practitioner | Admitting: Nurse Practitioner

## 2023-04-17 VITALS — BP 142/60 | HR 68 | Ht 69.0 in | Wt 222.0 lb

## 2023-04-17 DIAGNOSIS — I714 Abdominal aortic aneurysm, without rupture, unspecified: Secondary | ICD-10-CM

## 2023-04-17 DIAGNOSIS — I2510 Atherosclerotic heart disease of native coronary artery without angina pectoris: Secondary | ICD-10-CM

## 2023-04-17 DIAGNOSIS — G4733 Obstructive sleep apnea (adult) (pediatric): Secondary | ICD-10-CM

## 2023-04-17 DIAGNOSIS — E782 Mixed hyperlipidemia: Secondary | ICD-10-CM

## 2023-04-17 DIAGNOSIS — I10 Essential (primary) hypertension: Secondary | ICD-10-CM | POA: Diagnosis not present

## 2023-04-17 DIAGNOSIS — I480 Paroxysmal atrial fibrillation: Secondary | ICD-10-CM | POA: Diagnosis not present

## 2023-04-17 NOTE — Patient Instructions (Signed)
Medication Instructions:  Your physician recommends that you continue on your current medications as directed. Please refer to the Current Medication list given to you today. *If you need a refill on your cardiac medications before your next appointment, please call your pharmacy*   Lab Work: None ordered If you have labs (blood work) drawn today and your tests are completely normal, you will receive your results only by: MyChart Message (if you have MyChart) OR A paper copy in the mail If you have any lab test that is abnormal or we need to change your treatment, we will call you to review the results.   Testing/Procedures: None ordered   Follow-Up: At [facility], you and your health needs are our priority.  As part of our continuing mission to provide you with exceptional heart care, we have created designated Provider Care Teams.  These Care Teams include your primary Cardiologist (physician) and Advanced Practice Providers (APPs -  Physician Assistants and Nurse Practitioners) who all work together to provide you with the care you need, when you need it.  We recommend signing up for the patient portal called "MyChart".  Sign up information is provided on this After Visit Summary.  MyChart is used to connect with patients for Virtual Visits (Telemedicine).  Patients are able to view lab/test results, encounter notes, upcoming appointments, etc.  Non-urgent messages can be sent to your provider as well.   To learn more about what you can do with MyChart, go to [email or web address].    Your next appointment:   12 month(s)  Provider:   Lance Muss, MD  or Robin Searing, NP   Other Instructions

## 2023-04-21 DIAGNOSIS — C44319 Basal cell carcinoma of skin of other parts of face: Secondary | ICD-10-CM | POA: Diagnosis not present

## 2023-05-03 DIAGNOSIS — D492 Neoplasm of unspecified behavior of bone, soft tissue, and skin: Secondary | ICD-10-CM | POA: Diagnosis not present

## 2023-05-03 DIAGNOSIS — C44719 Basal cell carcinoma of skin of left lower limb, including hip: Secondary | ICD-10-CM | POA: Diagnosis not present

## 2023-05-05 ENCOUNTER — Ambulatory Visit (INDEPENDENT_AMBULATORY_CARE_PROVIDER_SITE_OTHER): Payer: Medicare HMO | Admitting: Internal Medicine

## 2023-05-05 ENCOUNTER — Encounter: Payer: Self-pay | Admitting: Internal Medicine

## 2023-05-05 VITALS — BP 130/72 | HR 70 | Temp 98.2°F | Resp 16 | Ht 69.0 in | Wt 222.4 lb

## 2023-05-05 DIAGNOSIS — Z0000 Encounter for general adult medical examination without abnormal findings: Secondary | ICD-10-CM | POA: Diagnosis not present

## 2023-05-05 DIAGNOSIS — I10 Essential (primary) hypertension: Secondary | ICD-10-CM | POA: Diagnosis not present

## 2023-05-05 DIAGNOSIS — R739 Hyperglycemia, unspecified: Secondary | ICD-10-CM

## 2023-05-05 DIAGNOSIS — E782 Mixed hyperlipidemia: Secondary | ICD-10-CM | POA: Diagnosis not present

## 2023-05-05 DIAGNOSIS — R972 Elevated prostate specific antigen [PSA]: Secondary | ICD-10-CM

## 2023-05-05 DIAGNOSIS — E079 Disorder of thyroid, unspecified: Secondary | ICD-10-CM | POA: Diagnosis not present

## 2023-05-05 DIAGNOSIS — Z0001 Encounter for general adult medical examination with abnormal findings: Secondary | ICD-10-CM

## 2023-05-05 LAB — LIPID PANEL
Cholesterol: 137 mg/dL (ref 0–200)
HDL: 49.9 mg/dL (ref >39.00)
LDL Cholesterol: 61 mg/dL (ref 0–99)
NonHDL: 87.59
Total CHOL/HDL Ratio: 3
Triglycerides: 135 mg/dL (ref 0.0–149.0)
VLDL: 27 mg/dL (ref 0.0–40.0)

## 2023-05-05 LAB — PSA: PSA: 8.67 ng/mL — ABNORMAL HIGH (ref 0.10–4.00)

## 2023-05-05 LAB — TSH: TSH: 8.26 u[IU]/mL — ABNORMAL HIGH (ref 0.35–5.50)

## 2023-05-05 LAB — HEMOGLOBIN A1C: Hgb A1c MFr Bld: 6 % (ref 4.6–6.5)

## 2023-05-05 LAB — ALT: ALT: 21 U/L (ref 0–53)

## 2023-05-05 LAB — AST: AST: 26 U/L (ref 0–37)

## 2023-05-05 LAB — T4, FREE: Free T4: 0.67 ng/dL (ref 0.60–1.60)

## 2023-05-05 NOTE — Progress Notes (Signed)
Subjective:    Patient ID: Philip [name], male    DOB: [birth year], 82 y.o.   MRN: [id number]  DOS:  05/05/2023 Type of visit - description: cpx  Here for CPX. In general feeling well. Ambulatory BPs in the 140/60. Denies chest pain or difficulty breathing. Denies dysuria, gross hematuria, difficulty with urination.  BP Readings from Last 3 Encounters:  05/05/23 130/72  04/17/23 (!) 142/60  01/30/23 138/86     Review of Systems  Other than above, a 14 point review of systems is negative     Past Medical History:  Diagnosis Date   AAA (abdominal aortic aneurysm) (HCC)    Arthritis    Atrial fibrillation (HCC)    CAD (coronary artery disease)    had a MI , s/p CABG   Cataracts, bilateral    immature   CHF (congestive heart failure) (HCC)    Dysrhythmia    paroximal a fib   GERD (gastroesophageal reflux disease)    takes Omeprazole daily   History of colon polyps    History of kidney stones    History of shingles    Hyperlipidemia    Hypertension    takes Amlodipine,Metoprolol,and Lisinopril daily   Joint pain    Joint swelling    Myocardial infarction (HCC)    several with last one being 2001(but never knew about any except 2001)   OSA (obstructive sleep apnea)    started CPAP 12/09   Peripheral vascular disease (HCC)    Peyronie's disease    s/p penile implant in 2006   Pneumonia    as a child   Skin cancer    several , one of them was melanoma (sees derm routinely)   Sleep apnea    Tingling    feet occasionally   Tinnitus     Past Surgical History:  Procedure Laterality Date   ABDOMINAL AORTAGRAM N/A 06/16/2014   Procedure: ABDOMINAL Ronny Flurry;  Surgeon: Chuck Hint, MD;  Location: [facility] CATH LAB;  Service: Cardiovascular;  Laterality: N/A;   ABDOMINAL AORTIC ENDOVASCULAR STENT GRAFT N/A 07/08/2014   Procedure: ABDOMINAL AORTIC ENDOVASCULAR STENT GRAFT/ GORE;  Surgeon: Chuck Hint, MD;  Location: [facility] OR;  Service: Vascular;   Laterality: N/A;   CARDIAC CATHETERIZATION  10/31/2013   COLONOSCOPY     CORONARY ARTERY BYPASS GRAFT  10/31/1997   x 3 vessels   LEFT HEART CATH AND CORS/GRAFTS ANGIOGRAPHY N/A 10/07/2021   Procedure: LEFT HEART CATH AND CORS/GRAFTS ANGIOGRAPHY;  Surgeon: Kathleene Hazel, MD;  Location: [facility] INVASIVE CV LAB;  Service: Cardiovascular;  Laterality: N/A;   LEFT HEART CATHETERIZATION WITH CORONARY/GRAFT ANGIOGRAM N/A 05/29/2014   Procedure: LEFT HEART CATHETERIZATION WITH Isabel Caprice;  Surgeon: Laurey Morale, MD;  Location: [facility] CATH LAB;  Service: Cardiovascular;  Laterality: N/A;   PENILE PROSTHESIS IMPLANT  08/31/2005   AMS inflatable penile prosthesis   RHINOPLASTY  10/31/1973   Social History   Socioeconomic History   Marital status: Married    Spouse name: Not on file   Number of children: 3   Years of education: 12   Highest education level: 12th grade  Occupational History   Occupation: retired, Research officer, political party  Tobacco Use   Smoking status: Former    Types: Cigarettes    Quit date: 1979    Years since quitting: 45.5   Smokeless tobacco: Never   Tobacco comments:    quit smoking in 1979  Vaping Use   Vaping Use: Never used  Substance and Sexual Activity   Alcohol use: Yes    Comment: occasionally - once per week   Drug use: No   Sexual activity: Not Currently  Other Topics Concern   Not on file  Social History Narrative   Lives w/ wife in a 2 story home.  Has one son and 2 stepchildren.  Retired for [employer].     Education: high school.   Social Determinants of Health   Financial Resource Strain: Low Risk  (01/25/2023)   Overall Financial Resource Strain (CARDIA)    Difficulty of Paying Living Expenses: Not hard at all  Food Insecurity: No Food Insecurity (01/25/2023)   Hunger Vital Sign    Worried About Running Out of Food in the Last Year: Never true    Ran Out of Food in the Last Year: Never true  Transportation Needs: No Transportation Needs  (01/25/2023)   PRAPARE - Administrator, Civil Service (Medical): No    Lack of Transportation (Non-Medical): No  Physical Activity: Sufficiently Active (01/25/2023)   Exercise Vital Sign    Days of Exercise per Week: 5 days    Minutes of Exercise per Session: 90 min  Stress: No Stress Concern Present (01/25/2023)   [employer] of Occupational Health - Occupational Stress Questionnaire    Feeling of Stress : Only a little  Social Connections: Moderately Integrated (01/25/2023)   Social Connection and Isolation Panel [NHANES]    Frequency of Communication with Friends and Family: More than three times a week    Frequency of Social Gatherings with Friends and Family: Twice a week    Attends Religious Services: Never    Database administrator or Organizations: Yes    Attends Engineer, structural: More than 4 times per year    Marital Status: Married  Catering manager Violence: Not At Risk (02/28/2022)   Humiliation, Afraid, Rape, and Kick questionnaire    Fear of Current or Ex-Partner: No    Emotionally Abused: No    Physically Abused: No    Sexually Abused: No    Current Outpatient Medications  Medication Instructions   amLODipine (NORVASC) 10 MG tablet TAKE 1 TABLET EVERY NIGHT   Ascorbic Acid (VITAMIN C ER PO) 500 mg, Oral, Daily   atorvastatin (LIPITOR) 40 MG tablet TAKE 1 TABLET AT BEDTIME   B Complex-Biotin-FA (B-COMPLEX PO) 1 tablet, Daily   co-enzyme Q-10 30 mg, Daily   ELIQUIS 5 MG TABS tablet TAKE 1 TABLET TWICE DAILY   hydrocortisone 2.5 % cream 1 application , Topical, As needed   losartan-hydrochlorothiazide (HYZAAR) 50-12.5 MG tablet 1 tablet, Oral, Daily   magnesium oxide (MAG-OX) 800 mg, Daily   Multiple Vitamins-Minerals (MULTIVITAMIN WITH MINERALS) tablet 1 tablet, Daily   Omega-3 Fish Oil 2,400 mg, Oral, Daily, Take 2 1,200 mg capsules daily   polyethylene glycol (MIRALAX / GLYCOLAX) 17 g, Oral, Daily       Objective:   Physical  Exam BP 130/72   Pulse 70   Temp 98.2 F (36.8 C) (Oral)   Resp 16   Ht 5\' 9"  (1.753 m)   Wt 222 lb 6 oz (100.9 kg)   SpO2 96%   BMI 32.84 kg/m  General: Well developed, NAD, BMI noted HEENT:  Normocephalic . Face symmetric, atraumatic Lungs:  CTA B Normal respiratory effort, no intercostal retractions, no accessory muscle use. Heart: Seems regular today. Abdomen:  Not distended, soft, non-tender. No rebound or rigidity.  No bruit Lower extremities: no  pretibial edema bilaterally DRE: Normal stools, prostate symptoms actually symmetrical and not tender today. Skin: Exposed areas without rash. Not pale. Not jaundice Neurologic:  alert & oriented X3.  Speech normal, gait appropriate for age and unassisted Strength symmetric and appropriate for age.  Psych: Cognition and judgment appear intact.  Cooperative with normal attention span and concentration.  Behavior appropriate. No anxious or depressed appearing.     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  watch for uncontrolled HTN, hypokalemia  -Cysts pancreas: Follow-up MRI in 2 years as recommended  PLAN: Here for CPX -Td:2015 - pnm 23: 2018;  prevnar :2015.     - s/p shingrix ,s/p zostavax ( [facility])  -Vaccines are recommended: COVID booster, flu shot every fall, PNM 20, RSV -CCS: C-scope 2007 aprox. polyps x 6 ,  03-2009, hyperplastic polyps. 09-2014:  polyps, cscope 08/2019, was told no further colonoscopies, no documentation. Prostate cancer screening:  +FH,  last note from urology 07-2020 for increased PSA, slt abnormal DRE, MRI prostate: No radiographic evidence of malignancy.  Today reports no symptoms, DRE is actually normal.  Last PSA slightly elevated, recheck PSA, further advised with results. -Labs: AST ALT FLP A1c TSH free T4 PSA -Diet and exercise: Counseled, doing well, goes to gym 3 times a week. -POA: On file Hyperglycemia: Check A1c HTN: Currently on amlodipine, Hyzaar 50-12.5 mg daily.  Last BMP okay.  Ambulatory BPs in the 140s, recommend to check regularly and schedule a nurse visit in 1 month, bring his cuff and BP log.  Consider change medications. Hyperlipidemia: On atorvastatin, check FLP Cardiovascular:: Saw cardiology 04/17/2023, no changes suggested. RTC nurse visit 1 month.  RTC routine checkup 4 months

## 2023-05-05 NOTE — Assessment & Plan Note (Signed)
-  [id number] - pnm 23: 2018;  prevnar :2015.     - s/p shingrix ,s/p zostavax ( [facility] [employer])  -Vaccines are recommended: COVID booster, flu shot every fall, PNM 20, RSV -CCS: C-scope 2007 aprox. polyps x 6 ,  03-2009, hyperplastic polyps. 09-2014: polyps, cscope 08/2019, was told no further colonoscopies, no documentation. Prostate cancer screening:  +FH,  last note from urology 07-2020 for increased PSA, slt abnormal DRE, MRI prostate: No radiographic evidence of malignancy.  Today reports no symptoms, DRE is actually normal.  Last PSA slightly elevated, recheck PSA, further advised with results. -Labs: AST ALT FLP A1c TSH free T4 PSA -Diet and exercise: Counseled, doing well, goes to gym 3 times a week. -POA: On file

## 2023-05-05 NOTE — Assessment & Plan Note (Signed)
Here for CPX Hyperglycemia: Check A1c HTN: Currently on amlodipine, Hyzaar 50-12.5 mg daily.  Last BMP okay.  Ambulatory BPs in the 140s, recommend to check regularly and schedule a nurse visit in 1 month, bring his cuff and BP log.  Consider change medications. Hyperlipidemia: On atorvastatin, check FLP Cardiovascular:: Saw cardiology 04/17/2023, no changes suggested. RTC nurse visit 1 month.  RTC routine checkup 4 months

## 2023-05-05 NOTE — Patient Instructions (Addendum)
Vaccines I recommend: Flu shot this fall RSV vaccine COVID-vaccine Pneumonia shot (PNM 20) RSV  Check the  blood pressure regularly Schedule a nurse visit in 1 month, bring your blood pressure log and your blood pressure cuff and let us compare with ours. BP GOAL is between 110/65 and  135/85. If it is consistently higher or lower, let me know     GO TO THE LAB : Get the blood work     GO TO THE FRONT DESK, PLEASE SCHEDULE YOUR APPOINTMENTS Come back for   a nurse visit in 1 month  Come back for a checkup in 4 months

## 2023-05-08 DIAGNOSIS — D0421 Carcinoma in situ of skin of right ear and external auricular canal: Secondary | ICD-10-CM | POA: Diagnosis not present

## 2023-05-08 NOTE — Addendum Note (Signed)
Addended byConrad Algona D on: 05/08/2023 01:58 PM   Modules accepted: Orders

## 2023-05-24 DIAGNOSIS — L089 Local infection of the skin and subcutaneous tissue, unspecified: Secondary | ICD-10-CM | POA: Diagnosis not present

## 2023-05-24 DIAGNOSIS — Z48817 Encounter for surgical aftercare following surgery on the skin and subcutaneous tissue: Secondary | ICD-10-CM | POA: Diagnosis not present

## 2023-05-24 DIAGNOSIS — Z4801 Encounter for change or removal of surgical wound dressing: Secondary | ICD-10-CM | POA: Diagnosis not present

## 2023-05-29 DIAGNOSIS — C44719 Basal cell carcinoma of skin of left lower limb, including hip: Secondary | ICD-10-CM | POA: Diagnosis not present

## 2023-05-29 DIAGNOSIS — Z4801 Encounter for change or removal of surgical wound dressing: Secondary | ICD-10-CM | POA: Diagnosis not present

## 2023-05-29 DIAGNOSIS — Z48817 Encounter for surgical aftercare following surgery on the skin and subcutaneous tissue: Secondary | ICD-10-CM | POA: Diagnosis not present

## 2023-06-07 ENCOUNTER — Ambulatory Visit (INDEPENDENT_AMBULATORY_CARE_PROVIDER_SITE_OTHER): Payer: Medicare HMO

## 2023-06-07 DIAGNOSIS — I10 Essential (primary) hypertension: Secondary | ICD-10-CM

## 2023-06-07 MED ORDER — LOSARTAN POTASSIUM-HCTZ 100-12.5 MG PO TABS: 1.0000 | ORAL_TABLET | Freq: Every day | ORAL | 0 refills | Status: DC

## 2023-06-07 NOTE — Progress Notes (Signed)
BP  is not well-controlled, has some good readings mixed with 150s, 160s. Currently on Amlodipine 10 mg Hyzaar 50-12.5 mg. Plan: Change to Hyzaar 100-12.5 mg daily. Low-salt diet Monitor BPs BMP in 2 weeks. Drop BP log

## 2023-06-07 NOTE — Progress Notes (Signed)
Pt here for Blood pressure check per Dr. Drue Novel  Pt currently takes: Amlodipine 10 MG, daily at night, Losartan- hydrochlorothiazide 50-12.5 mg, once daily   Pt reports compliance with medication.  BP today @ = 150/80 HR = 79  Pt advised per Dr. Drue Novel increase Losartan hydrochlorothiazide 100-12.5 mg. Recheck blood pressure in 2 weeks; nurse visit. Check BMP. Pt scheduled 06/21/23 for lab and nurse visit. Advised patient to continue monitoring blood pressures at home.

## 2023-06-09 DIAGNOSIS — L578 Other skin changes due to chronic exposure to nonionizing radiation: Secondary | ICD-10-CM | POA: Diagnosis not present

## 2023-06-09 DIAGNOSIS — L570 Actinic keratosis: Secondary | ICD-10-CM | POA: Diagnosis not present

## 2023-06-09 DIAGNOSIS — D0439 Carcinoma in situ of skin of other parts of face: Secondary | ICD-10-CM | POA: Diagnosis not present

## 2023-06-14 DIAGNOSIS — D485 Neoplasm of uncertain behavior of skin: Secondary | ICD-10-CM | POA: Diagnosis not present

## 2023-06-14 DIAGNOSIS — D0471 Carcinoma in situ of skin of right lower limb, including hip: Secondary | ICD-10-CM | POA: Diagnosis not present

## 2023-06-14 DIAGNOSIS — C44719 Basal cell carcinoma of skin of left lower limb, including hip: Secondary | ICD-10-CM | POA: Diagnosis not present

## 2023-06-20 ENCOUNTER — Other Ambulatory Visit: Payer: Self-pay | Admitting: *Deleted

## 2023-06-20 DIAGNOSIS — I739 Peripheral vascular disease, unspecified: Secondary | ICD-10-CM

## 2023-06-20 DIAGNOSIS — I714 Abdominal aortic aneurysm, without rupture, unspecified: Secondary | ICD-10-CM

## 2023-06-20 NOTE — Progress Notes (Signed)
vas 

## 2023-06-21 ENCOUNTER — Ambulatory Visit (INDEPENDENT_AMBULATORY_CARE_PROVIDER_SITE_OTHER): Payer: Medicare HMO

## 2023-06-21 ENCOUNTER — Other Ambulatory Visit (INDEPENDENT_AMBULATORY_CARE_PROVIDER_SITE_OTHER): Payer: Medicare HMO

## 2023-06-21 DIAGNOSIS — I10 Essential (primary) hypertension: Secondary | ICD-10-CM | POA: Diagnosis not present

## 2023-06-21 LAB — BASIC METABOLIC PANEL
BUN: 21 mg/dL (ref 6–23)
CO2: 29 mEq/L (ref 19–32)
Calcium: 9.6 mg/dL (ref 8.4–10.5)
Chloride: 101 mEq/L (ref 96–112)
Creatinine, Ser: 1.18 mg/dL (ref 0.40–1.50)
GFR: 57.23 mL/min — ABNORMAL LOW (ref >60.00)
Glucose, Bld: 110 mg/dL — ABNORMAL HIGH (ref 70–99)
Potassium: 4.2 mEq/L (ref 3.5–5.1)
Sodium: 139 mEq/L (ref 135–145)

## 2023-06-21 MED ORDER — HYDROCHLOROTHIAZIDE 25 MG PO TABS: 25.0000 mg | ORAL_TABLET | Freq: Every day | ORAL | 0 refills | Status: DC

## 2023-06-21 MED ORDER — LOSARTAN POTASSIUM 100 MG PO TABS: 100.0000 mg | ORAL_TABLET | Freq: Every day | ORAL | 0 refills | Status: DC

## 2023-06-21 NOTE — Progress Notes (Signed)
BPs still not well-controlled. - Continue amlodipine - Change Hyzaar to 1.  Losartan 100 mg 1 tablet daily 2.  HCTZ 25 mg 1 tablet daily Send prescriptions Nurse visit 2 weeks for BP check and BMP

## 2023-06-21 NOTE — Progress Notes (Signed)
Pt here for Blood pressure check per [name]  Pt currently takes: Amlodipine 10 mg once daily, Hyzaar 100-12.5 mg daily.    Pt reports compliance with medication.  BP today @ = 148/70 HR = 72  BP recheck: 128/70    Pt advised per [name] Change Hyzaar to Losartan 100mg  once daily, hydrochlorothiazide 25 mg. Rx sent to pharmacy. Pt had labs checked today. Appt scheduled in 2 weeks with nurse

## 2023-06-22 ENCOUNTER — Telehealth: Payer: Self-pay | Admitting: Internal Medicine

## 2023-06-22 ENCOUNTER — Other Ambulatory Visit: Payer: Self-pay

## 2023-06-22 DIAGNOSIS — I10 Essential (primary) hypertension: Secondary | ICD-10-CM

## 2023-06-22 NOTE — Telephone Encounter (Signed)
Pt said he missed a call. The only documentation I see is from his lab results on 06/21/23. Please call patient to advise.

## 2023-06-23 NOTE — Telephone Encounter (Signed)
Chart reviewed, unsure what call was for yesterday.

## 2023-06-29 ENCOUNTER — Ambulatory Visit (INDEPENDENT_AMBULATORY_CARE_PROVIDER_SITE_OTHER)
Admission: RE | Admit: 2023-06-29 | Discharge: 2023-06-29 | Disposition: A | Discharge status: Home / Self Care | Payer: Medicare HMO | Source: Ambulatory Visit | Attending: Vascular Surgery | Admitting: Vascular Surgery

## 2023-06-29 ENCOUNTER — Ambulatory Visit (HOSPITAL_COMMUNITY)
Admission: RE | Admit: 2023-06-29 | Discharge: 2023-06-29 | Disposition: A | Discharge status: Home / Self Care | Payer: Medicare HMO | Source: Ambulatory Visit | Attending: Vascular Surgery | Admitting: Vascular Surgery

## 2023-06-29 ENCOUNTER — Ambulatory Visit: Payer: Medicare HMO | Admitting: Vascular Surgery

## 2023-06-29 ENCOUNTER — Encounter: Payer: Self-pay | Admitting: Vascular Surgery

## 2023-06-29 VITALS — BP 149/80 | HR 75 | Temp 98.4°F | Resp 20 | Ht 69.0 in | Wt 223.0 lb

## 2023-06-29 DIAGNOSIS — I714 Abdominal aortic aneurysm, without rupture, unspecified: Secondary | ICD-10-CM

## 2023-06-29 DIAGNOSIS — I739 Peripheral vascular disease, unspecified: Secondary | ICD-10-CM

## 2023-06-29 LAB — VAS US ABI WITH/WO TBI
Left ABI: 0.67
Right ABI: 1.15

## 2023-06-29 NOTE — Progress Notes (Signed)
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  He comes in for his yearly follow-up visit.  The maximum diameter of the aneurysm is 6.8 cm which is unchanged compared to a year ago on his CT scan.  There is no evidence of endoleak.  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  This was largely thrombosed.  Given that this could not be adequately seen on his ultrasound today when he comes back in 1 year we will get a CT of the abdomen and pelvis.  Fortunately he is not a smoker.  I have explained that I will be retiring so he will be seen on the PA schedule at that time.  PERIPHERAL ARTERIAL DISEASE WITH CLAUDICATION: This patient has stable claudication of the left calf.  He is very active.  He is not a smoker.  I have explained that we would typically not consider arteriography and intervention unless he developed disabling claudication, rest pain, or nonhealing ulcer.  He is on a statin.  He is not on aspirin because he is on Eliquis for A-fib.  He comes back in 1 year we will get follow-up ABIs.  HPI:   Philip [name] is a pleasant 82 y.o. male who I last saw on 06/23/2022.  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  When I saw him last, the aneurysm remains stable in size at 6.7 cm.  There was no evidence of endoleak.  I recommended a follow-up ultrasound in 1 year.  His right common iliac artery measures 1.4 cm in maximum diameter and is stable in size.  The left  artery measures 2.4 cm in maximum diameter and is stable in size.  This was largely thrombosed.  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  I recommended ABIs when he returned for this visit.  Since I saw the patient last he  denies any abdominal pain or back pain.  He does have stable claudication of the left calf.  He walks on the treadmill and has to stop at times briefly before he can resume.  He denies any history of rest pain or nonhealing ulcers.  He has had multiple skin cancers removed this has been a chronic problem for him as he was in the [employer] for many years before they had sunscreen.  Past Medical History:  Diagnosis Date   AAA (abdominal aortic aneurysm) (HCC)    Arthritis    Atrial fibrillation (HCC)    CAD (coronary artery disease)    had a MI , s/p CABG   Cataracts, bilateral    immature   CHF (congestive heart failure) (HCC)    Dysrhythmia    paroximal a fib   GERD (gastroesophageal reflux disease)    takes Omeprazole daily   History of colon polyps    History of kidney stones    History of shingles    Hyperlipidemia    Hypertension    takes Amlodipine,Metoprolol,and Lisinopril daily   Joint pain    Joint swelling    Myocardial infarction (HCC)    several with last one being 2001(but never knew about any except 2001)   OSA (obstructive sleep apnea)  started CPAP 12/09   Peripheral vascular disease (HCC)    Peyronie's disease    s/p penile implant in 2006   Pneumonia    as a child   Skin cancer    several , one of them was melanoma (sees derm routinely)   Sleep apnea    Tingling    feet occasionally   Tinnitus     Family History  Problem Relation Age of Onset   Prostate cancer Father 20   Heart disease Father    Skin cancer Father    Heart attack Father    Cancer Father    Deep vein thrombosis Father    Hyperlipidemia Father    Hypertension Father    Heart disease Mother    Skin cancer Mother    Heart attack Mother    Cancer Mother    Hyperlipidemia Mother    Hypertension Mother    Varicose Veins Mother    Peripheral vascular disease Mother        amputation   Sleep apnea Brother    Hyperlipidemia Brother    Hypertension Brother    Prostate cancer Brother  29   Colon cancer Other        aunt   Skin cancer Brother    Skin cancer Sister    Cancer Sister    Heart disease Sister    Diabetes Neg Hx    Stroke Neg Hx     SOCIAL HISTORY: Social History   Tobacco Use   Smoking status: Former    Current packs/day: 0.00    Types: Cigarettes    Quit date: 1979    Years since quitting: 45.6   Smokeless tobacco: Never   Tobacco comments:    quit smoking in 1979  Substance Use Topics   Alcohol use: Yes    Comment: occasionally - once per week    No Known Allergies  Current Outpatient Medications  Medication Sig Dispense Refill   amLODipine (NORVASC) 10 MG tablet TAKE 1 TABLET EVERY NIGHT 90 tablet 2   Ascorbic Acid (VITAMIN C ER PO) Take 500 mg by mouth daily.     atorvastatin (LIPITOR) 40 MG tablet TAKE 1 TABLET AT BEDTIME 90 tablet 0   B Complex-Biotin-FA (B-COMPLEX PO) Take 1 tablet by mouth daily.     co-enzyme Q-10 30 MG capsule Take 30 mg by mouth daily.     ELIQUIS 5 MG TABS tablet TAKE 1 TABLET TWICE DAILY 180 tablet 3   fluorouracil (EFUDEX) 5 % cream Apply topically 2 (two) times daily.     hydrochlorothiazide (HYDRODIURIL) 25 MG tablet Take 1 tablet (25 mg total) by mouth daily. 30 tablet 0   hydrocortisone 2.5 % cream Apply 1 application topically as needed (skin irritation).     losartan (COZAAR) 100 MG tablet Take 1 tablet (100 mg total) by mouth daily. 30 tablet 0   magnesium oxide (MAG-OX) 400 MG tablet Take 800 mg by mouth daily.     Multiple Vitamins-Minerals (MULTIVITAMIN WITH MINERALS) tablet Take 1 tablet by mouth daily.     Omega-3 Fatty Acids (OMEGA-3 FISH OIL) 1200 MG CAPS Take 2 capsules (2,400 mg total) by mouth daily. Take 2 1,200 mg capsules daily 90 capsule 3   polyethylene glycol (MIRALAX / GLYCOLAX) packet Take 17 g by mouth daily.     No current facility-administered medications for this visit.    REVIEW OF SYSTEMS:  [X]  denotes positive finding, [ ]  denotes negative finding Cardiac  Comments:  Chest pain or chest pressure:    Shortness of breath upon exertion:    Short of breath when lying flat:    Irregular heart rhythm:        Vascular    Pain in calf, thigh, or hip brought on by ambulation:    Pain in feet at night that wakes you up from your sleep:     Blood clot in your veins:    Leg swelling:         Pulmonary    Oxygen at home:    Productive cough:     Wheezing:         Neurologic    Sudden weakness in arms or legs:     Sudden numbness in arms or legs:     Sudden onset of difficulty speaking or slurred speech:    Temporary loss of vision in one eye:     Problems with dizziness:         Gastrointestinal    Blood in stool:     Vomited blood:         Genitourinary    Burning when urinating:     Blood in urine:        Psychiatric    Major depression:         Hematologic    Bleeding problems:    Problems with blood clotting too easily:        Skin    Rashes or ulcers:        Constitutional    Fever or chills:     PHYSICAL EXAM:   Vitals:   06/29/23 1019  BP: (!) 149/80  Pulse: 75  Resp: 20  Temp: 98.4 F (36.9 C)  SpO2: 96%  Weight: 223 lb (101.2 kg)  Height: 5\' 9"  (1.753 m)    GENERAL: The patient is a well-nourished male, in no acute distress. The vital signs are documented above. CARDIAC: There is a regular rate and rhythm.  VASCULAR: I do not detect carotid bruits. On the right side he has a palpable femoral pulse and popliteal pulse.  I cannot palpate pedal pulses. On the left side he has a palpable femoral pulse.  I cannot palpate popliteal or pedal pulses. PULMONARY: There is good air exchange bilaterally without wheezing or rales. ABDOMEN: Soft and non-tender with normal pitched bowel sounds.  His aneurysm is not pulsatile MUSCULOSKELETAL: There are no major deformities or cyanosis. NEUROLOGIC: No focal weakness or paresthesias are detected. SKIN: There are no ulcers or rashes noted. PSYCHIATRIC: The patient has a normal  affect.  DATA:    DUPLEX ABDOMINAL AORTA: I have independently interpreted his duplex of the abdominal aorta.  The maximum diameter of his aneurysm is 6.8 cm which has not changed compared to 6.7 cm by CT scan a year ago.  The right common iliac artery measures 2 cm in maximum diameter.  The left common iliac artery measures 2 cm in maximum diameter.  I compared this to his CT scan a year ago at what time the right was 19 mm in the left 18 mm in diameter.  There is no evidence of endoleak.  ARTERIAL DOPPLER STUDY: I have independently interpreted his arterial Doppler study today.  On the right side there is a triphasic posterior tibial and dorsalis pedis signal.  ABI is 100%.  Toe pressures 88 mmHg.  On the left side there is a monophasic posterior tibial and dorsalis pedis signal.  ABIs 67%.  Toe pressure 61  mmHg.  Waverly Ferrari [facility] [phone]

## 2023-07-05 ENCOUNTER — Other Ambulatory Visit (INDEPENDENT_AMBULATORY_CARE_PROVIDER_SITE_OTHER): Payer: Medicare HMO

## 2023-07-05 ENCOUNTER — Ambulatory Visit (INDEPENDENT_AMBULATORY_CARE_PROVIDER_SITE_OTHER): Payer: Medicare HMO

## 2023-07-05 DIAGNOSIS — I10 Essential (primary) hypertension: Secondary | ICD-10-CM | POA: Diagnosis not present

## 2023-07-05 LAB — BASIC METABOLIC PANEL
BUN: 26 mg/dL — ABNORMAL HIGH (ref 6–23)
CO2: 31 meq/L (ref 19–32)
Calcium: 10.1 mg/dL (ref 8.4–10.5)
Chloride: 99 meq/L (ref 96–112)
Creatinine, Ser: 1.3 mg/dL (ref 0.40–1.50)
GFR: 50.94 mL/min — ABNORMAL LOW (ref >60.00)
Glucose, Bld: 97 mg/dL (ref 70–99)
Potassium: 4.9 meq/L (ref 3.5–5.1)
Sodium: 139 meq/L (ref 135–145)

## 2023-07-05 NOTE — Progress Notes (Signed)
Pt here for Blood pressure check per  [name]  Pt currently takes:  Amlodipine 10mg , at bedtime Hydrochlorothiazide 25mg ; once daily Losartan 100mg ; once daily   Pt reports compliance with medication.  BP today @ = 132/70 HR = 75  Pt advised per [name], no change, watch salt intake,  resting before taking blood pressures. Get BMP today. Pt advised to send blood pressure in 2 to 3 weeks.

## 2023-07-10 DIAGNOSIS — C44321 Squamous cell carcinoma of skin of nose: Secondary | ICD-10-CM | POA: Diagnosis not present

## 2023-07-10 DIAGNOSIS — L578 Other skin changes due to chronic exposure to nonionizing radiation: Secondary | ICD-10-CM | POA: Diagnosis not present

## 2023-07-10 DIAGNOSIS — D0471 Carcinoma in situ of skin of right lower limb, including hip: Secondary | ICD-10-CM | POA: Diagnosis not present

## 2023-07-10 DIAGNOSIS — L244 Irritant contact dermatitis due to drugs in contact with skin: Secondary | ICD-10-CM | POA: Diagnosis not present

## 2023-07-10 DIAGNOSIS — D485 Neoplasm of uncertain behavior of skin: Secondary | ICD-10-CM | POA: Diagnosis not present

## 2023-07-14 ENCOUNTER — Other Ambulatory Visit: Payer: Self-pay

## 2023-07-14 DIAGNOSIS — I739 Peripheral vascular disease, unspecified: Secondary | ICD-10-CM

## 2023-07-17 ENCOUNTER — Other Ambulatory Visit: Payer: Self-pay | Admitting: Interventional Cardiology

## 2023-07-17 ENCOUNTER — Other Ambulatory Visit: Payer: Self-pay | Admitting: Family Medicine

## 2023-07-17 DIAGNOSIS — R5383 Other fatigue: Secondary | ICD-10-CM

## 2023-07-17 DIAGNOSIS — I2510 Atherosclerotic heart disease of native coronary artery without angina pectoris: Secondary | ICD-10-CM

## 2023-07-17 DIAGNOSIS — R0609 Other forms of dyspnea: Secondary | ICD-10-CM

## 2023-07-17 MED ORDER — HYDROCHLOROTHIAZIDE 25 MG PO TABS: 25.0000 mg | ORAL_TABLET | Freq: Every day | ORAL | 1 refills | Status: DC

## 2023-07-17 MED ORDER — LOSARTAN POTASSIUM 100 MG PO TABS: 100.0000 mg | ORAL_TABLET | Freq: Every day | ORAL | 1 refills | Status: DC

## 2023-07-20 ENCOUNTER — Other Ambulatory Visit: Payer: Self-pay | Admitting: Family Medicine

## 2023-07-28 DIAGNOSIS — Z85828 Personal history of other malignant neoplasm of skin: Secondary | ICD-10-CM | POA: Diagnosis not present

## 2023-07-28 DIAGNOSIS — D044 Carcinoma in situ of skin of scalp and neck: Secondary | ICD-10-CM | POA: Diagnosis not present

## 2023-07-28 DIAGNOSIS — D485 Neoplasm of uncertain behavior of skin: Secondary | ICD-10-CM | POA: Diagnosis not present

## 2023-07-28 DIAGNOSIS — D0439 Carcinoma in situ of skin of other parts of face: Secondary | ICD-10-CM | POA: Diagnosis not present

## 2023-07-28 DIAGNOSIS — C44629 Squamous cell carcinoma of skin of left upper limb, including shoulder: Secondary | ICD-10-CM | POA: Diagnosis not present

## 2023-07-28 DIAGNOSIS — Z08 Encounter for follow-up examination after completed treatment for malignant neoplasm: Secondary | ICD-10-CM | POA: Diagnosis not present

## 2023-08-11 DIAGNOSIS — C44321 Squamous cell carcinoma of skin of nose: Secondary | ICD-10-CM | POA: Diagnosis not present

## 2023-08-28 DIAGNOSIS — C44629 Squamous cell carcinoma of skin of left upper limb, including shoulder: Secondary | ICD-10-CM | POA: Diagnosis not present

## 2023-08-28 DIAGNOSIS — D485 Neoplasm of uncertain behavior of skin: Secondary | ICD-10-CM | POA: Diagnosis not present

## 2023-08-28 DIAGNOSIS — C44619 Basal cell carcinoma of skin of left upper limb, including shoulder: Secondary | ICD-10-CM | POA: Diagnosis not present

## 2023-08-28 DIAGNOSIS — L244 Irritant contact dermatitis due to drugs in contact with skin: Secondary | ICD-10-CM | POA: Diagnosis not present

## 2023-08-28 DIAGNOSIS — L578 Other skin changes due to chronic exposure to nonionizing radiation: Secondary | ICD-10-CM | POA: Diagnosis not present

## 2023-09-08 ENCOUNTER — Ambulatory Visit (INDEPENDENT_AMBULATORY_CARE_PROVIDER_SITE_OTHER): Payer: Medicare HMO | Admitting: Internal Medicine

## 2023-09-08 ENCOUNTER — Encounter: Payer: Self-pay | Admitting: Internal Medicine

## 2023-09-08 VITALS — BP 136/60 | HR 71 | Temp 97.8°F | Resp 18 | Ht 69.0 in | Wt 225.1 lb

## 2023-09-08 DIAGNOSIS — I10 Essential (primary) hypertension: Secondary | ICD-10-CM | POA: Diagnosis not present

## 2023-09-08 DIAGNOSIS — E038 Other specified hypothyroidism: Secondary | ICD-10-CM

## 2023-09-08 DIAGNOSIS — D696 Thrombocytopenia, unspecified: Secondary | ICD-10-CM | POA: Diagnosis not present

## 2023-09-08 LAB — CBC WITH DIFFERENTIAL/PLATELET
Basophils Absolute: 0 10*3/uL (ref 0.0–0.1)
Basophils Relative: 0.7 % (ref 0.0–3.0)
Eosinophils Absolute: 0 10*3/uL (ref 0.0–0.7)
Eosinophils Relative: 1.2 % (ref 0.0–5.0)
HCT: 40.8 % (ref 39.0–52.0)
Hemoglobin: 13.8 g/dL (ref 13.0–17.0)
Lymphocytes Relative: 41.7 % (ref 12.0–46.0)
Lymphs Abs: 1.6 10*3/uL (ref 0.7–4.0)
MCHC: 33.7 g/dL (ref 30.0–36.0)
MCV: 93.6 fL (ref 78.0–100.0)
Monocytes Absolute: 0.5 10*3/uL (ref 0.1–1.0)
Monocytes Relative: 13.3 % — ABNORMAL HIGH (ref 3.0–12.0)
Neutro Abs: 1.7 10*3/uL (ref 1.4–7.7)
Neutrophils Relative %: 43.1 % (ref 43.0–77.0)
Platelets: 162 10*3/uL (ref 150.0–400.0)
RBC: 4.36 Mil/uL (ref 4.22–5.81)
RDW: 13.8 % (ref 11.5–15.5)
WBC: 3.9 10*3/uL — ABNORMAL LOW (ref 4.0–10.5)

## 2023-09-08 LAB — T4, FREE: Free T4: 0.74 ng/dL (ref 0.60–1.60)

## 2023-09-08 LAB — TSH: TSH: 4.75 u[IU]/mL (ref 0.35–5.50)

## 2023-09-08 NOTE — Progress Notes (Signed)
Subjective:    Patient ID: Philip [name], male    DOB: [birth year], 82 y.o.   MRN: [id number]  DOS:  09/08/2023 Type of visit - description: Follow-up  Since the last office visit is doing well. Good ambulatory BPs. PSA was elevated but denies symptoms such as difficulty urinating, dysuria or blood in the urine.   Review of Systems See above   Past Medical History:  Diagnosis Date   AAA (abdominal aortic aneurysm) (HCC)    Arthritis    Atrial fibrillation (HCC)    CAD (coronary artery disease)    had a MI , s/p CABG   Cataracts, bilateral    immature   CHF (congestive heart failure) (HCC)    Dysrhythmia    paroximal a fib   GERD (gastroesophageal reflux disease)    takes Omeprazole daily   History of colon polyps    History of kidney stones    History of shingles    Hyperlipidemia    Hypertension    takes Amlodipine,Metoprolol,and Lisinopril daily   Joint pain    Joint swelling    Myocardial infarction (HCC)    several with last one being 2001(but never knew about any except 2001)   OSA (obstructive sleep apnea)    started CPAP 12/09   Peripheral vascular disease (HCC)    Peyronie's disease    s/p penile implant in 2006   Pneumonia    as a child   Skin cancer    several , one of them was melanoma (sees derm routinely)   Sleep apnea    Tingling    feet occasionally   Tinnitus     Past Surgical History:  Procedure Laterality Date   ABDOMINAL AORTAGRAM N/A 06/16/2014   Procedure: ABDOMINAL Ronny Flurry;  Surgeon: Chuck Hint, MD;  Location: [facility] CATH LAB;  Service: Cardiovascular;  Laterality: N/A;   ABDOMINAL AORTIC ENDOVASCULAR STENT GRAFT N/A 07/08/2014   Procedure: ABDOMINAL AORTIC ENDOVASCULAR STENT GRAFT/ GORE;  Surgeon: Chuck Hint, MD;  Location: [facility] OR;  Service: Vascular;  Laterality: N/A;   CARDIAC CATHETERIZATION  10/31/2013   COLONOSCOPY     CORONARY ARTERY BYPASS GRAFT  10/31/1997   x 3 vessels   LEFT HEART CATH AND CORS/GRAFTS  ANGIOGRAPHY N/A 10/07/2021   Procedure: LEFT HEART CATH AND CORS/GRAFTS ANGIOGRAPHY;  Surgeon: Kathleene Hazel, MD;  Location: [facility] INVASIVE CV LAB;  Service: Cardiovascular;  Laterality: N/A;   LEFT HEART CATHETERIZATION WITH CORONARY/GRAFT ANGIOGRAM N/A 05/29/2014   Procedure: LEFT HEART CATHETERIZATION WITH Isabel Caprice;  Surgeon: Laurey Morale, MD;  Location: [facility] CATH LAB;  Service: Cardiovascular;  Laterality: N/A;   PENILE PROSTHESIS IMPLANT  08/31/2005   AMS inflatable penile prosthesis   RHINOPLASTY  10/31/1973    Current Outpatient Medications  Medication Instructions   amLODipine (NORVASC) 10 MG tablet TAKE 1 TABLET EVERY NIGHT   Ascorbic Acid (VITAMIN C ER PO) 500 mg, Oral, Daily   atorvastatin (LIPITOR) 40 mg, Oral, Daily   B Complex-Biotin-FA (B-COMPLEX PO) 1 tablet, Daily   co-enzyme Q-10 30 mg, Daily   ELIQUIS 5 MG TABS tablet TAKE 1 TABLET TWICE DAILY   fluorouracil (EFUDEX) 5 % cream Topical, 2 times daily   hydrochlorothiazide (HYDRODIURIL) 25 mg, Oral, Daily   hydrocortisone 2.5 % cream 1 application , Topical, As needed   losartan (COZAAR) 100 mg, Oral, Daily   magnesium oxide (MAG-OX) 800 mg, Daily   Multiple Vitamins-Minerals (MULTIVITAMIN WITH MINERALS) tablet 1 tablet, Daily  Omega-3 Fish Oil 2,400 mg, Oral, Daily, Take 2 1,200 mg capsules daily   polyethylene glycol (MIRALAX / GLYCOLAX) 17 g, Oral, Daily       Objective:   Physical Exam BP 136/60   Pulse 71   Temp 97.8 F (36.6 C) (Oral)   Resp 18   Ht 5\' 9"  (1.753 m)   Wt 225 lb 2 oz (102.1 kg)   SpO2 97%   BMI 33.25 kg/m  General:   Well developed, NAD, BMI noted. HEENT:  Normocephalic . Face symmetric, atraumatic Lungs:  CTA B Normal respiratory effort, no intercostal retractions, no accessory muscle use. Heart: RRR,  no murmur.  Lower extremities: trace pretibial edema bilaterally.  R calf is larger (chronic finding per patient), soft and nontender. Skin: Not pale. Not  jaundice Neurologic:  alert & oriented X3.  Speech normal, gait appropriate for age and unassisted Psych--  Cognition and judgment appear intact.  Cooperative with normal attention span and concentration.  Behavior appropriate. No anxious or depressed appearing.      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  watch for uncontrolled HTN, hypokalemia  -Cysts pancreas: Follow-up MRI in 2 years as recommended  PLAN: HTN: since LOV meds were adjusted, currently on Losartan 100 mg, amlodipine 10 mg, HCTZ 25 mg.  Last BMP okay.  Ambulatory BPs 130/60, 130/70.  No change. PAD: Saw vascular surgery 06/29/2023. Increased PSA: PSA  was elevated, saw urology, next visit with them December 2024. Thrombocytopenia: Per chart review, check CBC. Subclinical hypothyroidism: Check TFTs. R calf: noted to be larger, soft, nontender, chronic finding per patient Preventive care: Strongly declines a flu shot or COVID-vaccine. RTC 4 to 5 months.

## 2023-09-08 NOTE — Assessment & Plan Note (Addendum)
HTN: since LOV meds were adjusted, currently on Losartan 100 mg, amlodipine 10 mg, HCTZ 25 mg.  Last BMP okay.  Ambulatory BPs 130/60, 130/70.  No change. PAD: Saw vascular surgery 06/29/2023. Increased PSA: PSA  was elevated, saw urology, next visit with them December 2024. Thrombocytopenia: Per chart review, check CBC. Subclinical hypothyroidism: Check TFTs. R calf: noted to be larger, soft, nontender, chronic finding per patient Preventive care: Strongly declines a flu shot or COVID-vaccine. RTC 4 to 5 months.

## 2023-09-08 NOTE — Patient Instructions (Addendum)
Continue taking your blood pressure regularly Blood pressure goal:  between 110/65 and  135/85. If it is consistently higher or lower, let me know     GO TO THE LAB : Get the blood work     Next visit with me in 4 to 5  months for a checkup Please schedule it at the front desk

## 2023-09-11 DIAGNOSIS — D044 Carcinoma in situ of skin of scalp and neck: Secondary | ICD-10-CM | POA: Diagnosis not present

## 2023-09-20 ENCOUNTER — Other Ambulatory Visit: Payer: Self-pay | Admitting: Medical Genetics

## 2023-09-20 DIAGNOSIS — Z006 Encounter for examination for normal comparison and control in clinical research program: Secondary | ICD-10-CM

## 2023-09-25 DIAGNOSIS — D485 Neoplasm of uncertain behavior of skin: Secondary | ICD-10-CM | POA: Diagnosis not present

## 2023-09-25 DIAGNOSIS — C44222 Squamous cell carcinoma of skin of right ear and external auricular canal: Secondary | ICD-10-CM | POA: Diagnosis not present

## 2023-09-25 DIAGNOSIS — C44619 Basal cell carcinoma of skin of left upper limb, including shoulder: Secondary | ICD-10-CM | POA: Diagnosis not present

## 2023-10-11 DIAGNOSIS — C44222 Squamous cell carcinoma of skin of right ear and external auricular canal: Secondary | ICD-10-CM | POA: Diagnosis not present

## 2023-10-23 DIAGNOSIS — R972 Elevated prostate specific antigen [PSA]: Secondary | ICD-10-CM | POA: Diagnosis not present

## 2023-11-06 DIAGNOSIS — D0439 Carcinoma in situ of skin of other parts of face: Secondary | ICD-10-CM | POA: Diagnosis not present

## 2023-11-07 ENCOUNTER — Telehealth: Payer: Self-pay | Admitting: Internal Medicine

## 2023-11-07 DIAGNOSIS — K869 Disease of pancreas, unspecified: Secondary | ICD-10-CM

## 2023-11-07 NOTE — Telephone Encounter (Signed)
 Arrange for MRI abdomen with and without contrast, Dx follow-up pancreatic lesion (seen on MRI 2 years ago was (. Needs BMP before MRI.

## 2023-11-07 NOTE — Telephone Encounter (Signed)
 Mychart message sent to Pt, orders placed.

## 2023-11-10 DIAGNOSIS — Z135 Encounter for screening for eye and ear disorders: Secondary | ICD-10-CM | POA: Diagnosis not present

## 2023-11-10 DIAGNOSIS — Z961 Presence of intraocular lens: Secondary | ICD-10-CM | POA: Diagnosis not present

## 2023-11-10 DIAGNOSIS — H524 Presbyopia: Secondary | ICD-10-CM | POA: Diagnosis not present

## 2023-11-16 ENCOUNTER — Other Ambulatory Visit (INDEPENDENT_AMBULATORY_CARE_PROVIDER_SITE_OTHER): Payer: Medicare HMO

## 2023-11-16 DIAGNOSIS — K869 Disease of pancreas, unspecified: Secondary | ICD-10-CM | POA: Diagnosis not present

## 2023-11-16 LAB — BASIC METABOLIC PANEL
BUN: 23 mg/dL (ref 6–23)
CO2: 33 meq/L — ABNORMAL HIGH (ref 19–32)
Calcium: 9.6 mg/dL (ref 8.4–10.5)
Chloride: 98 meq/L (ref 96–112)
Creatinine, Ser: 1.32 mg/dL (ref 0.40–1.50)
GFR: 49.89 mL/min — ABNORMAL LOW (ref >60.00)
Glucose, Bld: 99 mg/dL (ref 70–99)
Potassium: 4.2 meq/L (ref 3.5–5.1)
Sodium: 139 meq/L (ref 135–145)

## 2023-11-22 ENCOUNTER — Ambulatory Visit (HOSPITAL_BASED_OUTPATIENT_CLINIC_OR_DEPARTMENT_OTHER)
Admission: RE | Admit: 2023-11-22 | Discharge: 2023-11-22 | Disposition: A | Discharge status: Home / Self Care | Payer: Medicare HMO | Source: Ambulatory Visit | Attending: Internal Medicine | Admitting: Internal Medicine

## 2023-11-22 DIAGNOSIS — N281 Cyst of kidney, acquired: Secondary | ICD-10-CM | POA: Diagnosis not present

## 2023-11-22 DIAGNOSIS — K869 Disease of pancreas, unspecified: Secondary | ICD-10-CM | POA: Diagnosis not present

## 2023-11-22 DIAGNOSIS — N261 Atrophy of kidney (terminal): Secondary | ICD-10-CM | POA: Diagnosis not present

## 2023-11-22 DIAGNOSIS — I7143 Infrarenal abdominal aortic aneurysm, without rupture: Secondary | ICD-10-CM | POA: Diagnosis not present

## 2023-11-22 DIAGNOSIS — D3501 Benign neoplasm of right adrenal gland: Secondary | ICD-10-CM | POA: Diagnosis not present

## 2023-11-22 MED ORDER — GADOBUTROL 1 MMOL/ML IV SOLN
10.0000 mL | Freq: Once | INTRAVENOUS | Status: AC | PRN
  Administered 2023-11-22: 10 mL via INTRAVENOUS

## 2023-12-01 ENCOUNTER — Encounter: Payer: Self-pay | Admitting: Internal Medicine

## 2023-12-01 ENCOUNTER — Telehealth: Payer: Self-pay

## 2023-12-01 NOTE — Telephone Encounter (Signed)
 Noted, thank you

## 2023-12-01 NOTE — Telephone Encounter (Signed)
I just sent my comments

## 2023-12-01 NOTE — Telephone Encounter (Signed)
Copied from CRM [id number]. Topic: Clinical - Lab/Test Results >> Nov 30, 2023  4:47 PM Corin V wrote: Reason for CRM: Patient called regarding his MRI results from 1/22. They are still pending. He is very anxious to get these results due to a possible mast and asked that someone call him back with an update on the results tomorrow so he has an idea of when to expect the next steps.

## 2023-12-01 NOTE — Telephone Encounter (Signed)
 Please advise

## 2024-01-07 ENCOUNTER — Other Ambulatory Visit: Payer: Self-pay | Admitting: Internal Medicine

## 2024-01-26 ENCOUNTER — Telehealth: Payer: Self-pay | Admitting: Internal Medicine

## 2024-01-26 NOTE — Telephone Encounter (Signed)
 Copied from CRM [id number]. Topic: Medicare AWV >> Jan 26, 2024  9:38 AM Payton Doughty wrote: Reason for CRM: Called LVM 01/25/2024 to schedule AWV. Please schedule Virtual or Telehealth visits ONLY.   Verlee Rossetti; Care Guide Ambulatory Clinical Support [facility] Direct Dial: [phone]

## 2024-01-29 DIAGNOSIS — R972 Elevated prostate specific antigen [PSA]: Secondary | ICD-10-CM | POA: Diagnosis not present

## 2024-01-29 LAB — PSA: PSA: 7.28

## 2024-01-31 DIAGNOSIS — C44311 Basal cell carcinoma of skin of nose: Secondary | ICD-10-CM | POA: Diagnosis not present

## 2024-01-31 DIAGNOSIS — C44519 Basal cell carcinoma of skin of other part of trunk: Secondary | ICD-10-CM | POA: Diagnosis not present

## 2024-01-31 DIAGNOSIS — D0471 Carcinoma in situ of skin of right lower limb, including hip: Secondary | ICD-10-CM | POA: Diagnosis not present

## 2024-01-31 DIAGNOSIS — Z08 Encounter for follow-up examination after completed treatment for malignant neoplasm: Secondary | ICD-10-CM | POA: Diagnosis not present

## 2024-01-31 DIAGNOSIS — L820 Inflamed seborrheic keratosis: Secondary | ICD-10-CM | POA: Diagnosis not present

## 2024-01-31 DIAGNOSIS — L578 Other skin changes due to chronic exposure to nonionizing radiation: Secondary | ICD-10-CM | POA: Diagnosis not present

## 2024-01-31 DIAGNOSIS — L570 Actinic keratosis: Secondary | ICD-10-CM | POA: Diagnosis not present

## 2024-01-31 DIAGNOSIS — Z85828 Personal history of other malignant neoplasm of skin: Secondary | ICD-10-CM | POA: Diagnosis not present

## 2024-01-31 DIAGNOSIS — D485 Neoplasm of uncertain behavior of skin: Secondary | ICD-10-CM | POA: Diagnosis not present

## 2024-02-05 DIAGNOSIS — R972 Elevated prostate specific antigen [PSA]: Secondary | ICD-10-CM | POA: Diagnosis not present

## 2024-02-05 DIAGNOSIS — N402 Nodular prostate without lower urinary tract symptoms: Secondary | ICD-10-CM | POA: Diagnosis not present

## 2024-02-06 ENCOUNTER — Other Ambulatory Visit: Payer: Self-pay | Admitting: Interventional Cardiology

## 2024-02-06 ENCOUNTER — Encounter: Payer: Self-pay | Admitting: Internal Medicine

## 2024-02-06 DIAGNOSIS — I480 Paroxysmal atrial fibrillation: Secondary | ICD-10-CM

## 2024-02-07 ENCOUNTER — Ambulatory Visit (INDEPENDENT_AMBULATORY_CARE_PROVIDER_SITE_OTHER): Payer: Medicare HMO | Admitting: Internal Medicine

## 2024-02-07 ENCOUNTER — Encounter: Payer: Self-pay | Admitting: Internal Medicine

## 2024-02-07 VITALS — BP 130/82 | HR 81 | Temp 98.1°F | Resp 16 | Ht 69.0 in | Wt 229.5 lb

## 2024-02-07 DIAGNOSIS — E782 Mixed hyperlipidemia: Secondary | ICD-10-CM | POA: Diagnosis not present

## 2024-02-07 DIAGNOSIS — I10 Essential (primary) hypertension: Secondary | ICD-10-CM | POA: Diagnosis not present

## 2024-02-07 DIAGNOSIS — R739 Hyperglycemia, unspecified: Secondary | ICD-10-CM

## 2024-02-07 LAB — HEPATIC FUNCTION PANEL
ALT: 21 U/L (ref 0–53)
AST: 29 U/L (ref 0–37)
Albumin: 5 g/dL (ref 3.5–5.2)
Alkaline Phosphatase: 69 U/L (ref 39–117)
Bilirubin, Direct: 0.2 mg/dL (ref 0.0–0.3)
Total Bilirubin: 0.9 mg/dL (ref 0.2–1.2)
Total Protein: 7.4 g/dL (ref 6.0–8.3)

## 2024-02-07 LAB — HEMOGLOBIN A1C: Hgb A1c MFr Bld: 6 % (ref 4.6–6.5)

## 2024-02-07 NOTE — Patient Instructions (Addendum)
 Vaccines I recommend: RSV vaccine Pneumonia shot (PNM 20)   Continue checking your blood pressure regularly Blood pressure goal:  between 110/65 and  135/85. If it is consistently higher or lower, let me know     GO TO THE LAB : Get the blood work     Please go to the front desk: Arrange for a physical exam by 04-2024

## 2024-02-07 NOTE — Progress Notes (Unsigned)
 Subjective:    Patient ID: Philip [name], male    DOB: 1940/11/10, 83 y.o.   MRN: [id number]  DOS:  02/07/2024 Type of visit - description: Routine follow-up  In general feeling well. Denies chest pain or difficulty breathing. Claudication has increased: Has more frequent and intense left calf pain with exertion.  Pain is stops with rest.  No low back pain.   Review of Systems See above   Past Medical History:  Diagnosis Date   AAA (abdominal aortic aneurysm) (HCC)    Arthritis    Atrial fibrillation (HCC)    CAD (coronary artery disease)    had a MI , s/p CABG   Cataracts, bilateral    immature   CHF (congestive heart failure) (HCC)    Dysrhythmia    paroximal a fib   GERD (gastroesophageal reflux disease)    takes Omeprazole daily   History of colon polyps    History of kidney stones    History of shingles    Hyperlipidemia    Hypertension    takes Amlodipine,Metoprolol,and Lisinopril daily   Joint pain    Joint swelling    Myocardial infarction (HCC)    several with last one being 2001(but never knew about any except 2001)   OSA (obstructive sleep apnea)    started CPAP 12/09   Peripheral vascular disease (HCC)    Peyronie's disease    s/p penile implant in 2006   Pneumonia    as a child   Skin cancer    several , one of them was melanoma (sees derm routinely)   Sleep apnea    Tingling    feet occasionally   Tinnitus     Past Surgical History:  Procedure Laterality Date   ABDOMINAL AORTAGRAM N/A 06/16/2014   Procedure: ABDOMINAL Ronny Flurry;  Surgeon: Chuck Hint, MD;  Location: [facility] CATH LAB;  Service: Cardiovascular;  Laterality: N/A;   ABDOMINAL AORTIC ENDOVASCULAR STENT GRAFT N/A 07/08/2014   Procedure: ABDOMINAL AORTIC ENDOVASCULAR STENT GRAFT/ GORE;  Surgeon: Chuck Hint, MD;  Location: [facility] OR;  Service: Vascular;  Laterality: N/A;   CARDIAC CATHETERIZATION  10/31/2013   COLONOSCOPY     CORONARY ARTERY BYPASS GRAFT  10/31/1997    x 3 vessels   LEFT HEART CATH AND CORS/GRAFTS ANGIOGRAPHY N/A 10/07/2021   Procedure: LEFT HEART CATH AND CORS/GRAFTS ANGIOGRAPHY;  Surgeon: Kathleene Hazel, MD;  Location: [facility] INVASIVE CV LAB;  Service: Cardiovascular;  Laterality: N/A;   LEFT HEART CATHETERIZATION WITH CORONARY/GRAFT ANGIOGRAM N/A 05/29/2014   Procedure: LEFT HEART CATHETERIZATION WITH Isabel Caprice;  Surgeon: Laurey Morale, MD;  Location: [facility] CATH LAB;  Service: Cardiovascular;  Laterality: N/A;   PENILE PROSTHESIS IMPLANT  08/31/2005   AMS inflatable penile prosthesis   RHINOPLASTY  10/31/1973    Current Outpatient Medications  Medication Instructions   amLODipine (NORVASC) 10 MG tablet TAKE 1 TABLET EVERY NIGHT   Ascorbic Acid (VITAMIN C ER PO) 500 mg, Daily   atorvastatin (LIPITOR) 40 mg, Oral, Daily   B Complex-Biotin-FA (B-COMPLEX PO) 1 tablet, Daily   co-enzyme Q-10 30 mg, Daily   Eliquis 5 mg, Oral, 2 times daily   fluorouracil (EFUDEX) 5 % cream 2 times daily   hydrochlorothiazide (HYDRODIURIL) 25 mg, Oral, Daily   hydrocortisone 2.5 % cream 1 application , As needed   losartan (COZAAR) 100 mg, Oral, Daily   magnesium oxide (MAG-OX) 800 mg, Daily   Multiple Vitamins-Minerals (MULTIVITAMIN WITH MINERALS) tablet 1 tablet, Daily  Omega-3 Fish Oil 2,400 mg, Daily   polyethylene glycol (MIRALAX / GLYCOLAX) 17 g, Daily       Objective:   Physical Exam BP 130/82   Pulse 81   Temp 98.1 F (36.7 C) (Oral)   Resp 16   Ht 5\' 9"  (1.753 m)   Wt 229 lb 8 oz (104.1 kg)   SpO2 97%   BMI 33.89 kg/m  General:   Well developed, NAD, BMI noted. HEENT:  Normocephalic . Face symmetric, atraumatic Lungs:  CTA B Normal respiratory effort, no intercostal retractions, no accessory muscle use. Heart: RRR,  no murmur.  Lower extremities: no pretibial edema bilaterally  Skin: Not pale. Not jaundice Neurologic:  alert & oriented X3.  Speech normal, gait appropriate for age and unassisted Psych--   Cognition and judgment appear intact.  Cooperative with normal attention span and concentration.  Behavior appropriate. No anxious or depressed appearing.      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  -Cysts pancreas:   MRI 11/2023, no f/u needed  Obesity- 1st visit Dr Dalbert Garnet 01-18-17 PLAN: Hyperglycemia: Check A1c. HTN ambulatory BPs in the 130/70 range, continue amlodipine, HCTZ, losartan.  Last BMP okay. CAD: Denies angina, he is anticoagulated.  On statins, check LFTs. PAD: Increasing claudication symptoms, plans to reach out to vascular surgery which is appropriate.  Denies pain at rest. Vaccine advised: Declined vaccines today. Elevated PSA.  Last PSA 7.2 (01/29/2024, K PN) RTC 04-2024 CPX

## 2024-02-07 NOTE — Telephone Encounter (Signed)
 Prescription refill request for Eliquis received. Indication:afib Last office visit:6/24 Scr:1.32  1/25 Age: 83 Weight:102.1  kg  Prescription refilled

## 2024-02-08 NOTE — Assessment & Plan Note (Signed)
 Hyperglycemia: Check A1c. HTN ambulatory BPs in the 130/70 range, continue amlodipine, HCTZ, losartan.  Last BMP okay. CAD: Denies angina, he is anticoagulated.  On statins, check LFTs. PAD: Increasing claudication symptoms, plans to reach out to vascular surgery which is appropriate.  Denies pain at rest. Vaccine advised: Declined vaccines today. Elevated PSA.  Last PSA 7.2 (01/29/2024, KPN) RTC 04-2024 CPX

## 2024-02-09 ENCOUNTER — Encounter: Payer: Self-pay | Admitting: Internal Medicine

## 2024-03-04 DIAGNOSIS — C44311 Basal cell carcinoma of skin of nose: Secondary | ICD-10-CM | POA: Diagnosis not present

## 2024-03-11 DIAGNOSIS — C4442 Squamous cell carcinoma of skin of scalp and neck: Secondary | ICD-10-CM | POA: Diagnosis not present

## 2024-03-11 DIAGNOSIS — C44519 Basal cell carcinoma of skin of other part of trunk: Secondary | ICD-10-CM | POA: Diagnosis not present

## 2024-03-11 DIAGNOSIS — D485 Neoplasm of uncertain behavior of skin: Secondary | ICD-10-CM | POA: Diagnosis not present

## 2024-03-18 DIAGNOSIS — D0471 Carcinoma in situ of skin of right lower limb, including hip: Secondary | ICD-10-CM | POA: Diagnosis not present

## 2024-03-27 ENCOUNTER — Ambulatory Visit (INDEPENDENT_AMBULATORY_CARE_PROVIDER_SITE_OTHER)

## 2024-03-27 VITALS — BP 130/82 | Ht 69.0 in | Wt 228.0 lb

## 2024-03-27 DIAGNOSIS — Z0000 Encounter for general adult medical examination without abnormal findings: Secondary | ICD-10-CM | POA: Diagnosis not present

## 2024-03-27 NOTE — Progress Notes (Signed)
 Because this visit was a virtual/telehealth visit,  certain criteria was not obtained, such a blood pressure, CBG if applicable, and timed get up and go. Any medications not marked as "taking" were not mentioned during the medication reconciliation part of the visit. Any vitals not documented were not able to be obtained due to this being a telehealth visit or patient was unable to self-report a recent blood pressure reading due to a lack of equipment at home via telehealth. Vitals that have been documented are verbally provided by the patient.   This visit was performed by a medical professional under my direct supervision. I was immediately available for consultation/collaboration. I have reviewed and agree with the Annual Wellness Visit documentation.  Subjective:   Philip [name] is a 83 y.o. who presents for a Medicare Wellness preventive visit.  As a reminder, Annual Wellness Visits don't include a physical exam, and some assessments may be limited, especially if this visit is performed virtually. We may recommend an in-person follow-up visit with your provider if needed.  Visit Complete: Virtual I connected with  Philip [name] on 03/27/24 by a audio enabled telemedicine application and verified that I am speaking with the correct person using two identifiers.  Patient Location: Home  Provider Location: Home Office  I discussed the limitations of evaluation and management by telemedicine. The patient expressed understanding and agreed to proceed.  Vital Signs: Because this visit was a virtual/telehealth visit, some criteria may be missing or patient reported. Any vitals not documented were not able to be obtained and vitals that have been documented are patient reported.  VideoDeclined- This patient declined Librarian, academic. Therefore the visit was completed with audio only.  Persons Participating in Visit: Patient.  AWV Questionnaire: No: Patient  Medicare AWV questionnaire was not completed prior to this visit.  Cardiac Risk Factors include: advanced age (>38men, >12 women);male gender;dyslipidemia;obesity (BMI >30kg/m2);hypertension     Objective:     Today's Vitals   03/27/24 1326  BP: 130/82  Weight: 228 lb (103.4 kg)  Height: 5\' 9"  (1.753 m)   Body mass index is 33.67 kg/m.     03/27/2024    1:30 PM 03/07/2023    3:37 PM 02/28/2022    3:32 PM 01/28/2022   12:09 PM 10/06/2021   12:55 PM 08/05/2020    3:46 PM 05/27/2020    9:21 AM  Advanced Directives  Does Patient Have a Medical Advance Directive? Yes Yes Yes Yes Yes Yes Yes  Type of Estate agent of [place];Living will Healthcare Power of [place];Living will Healthcare Power of [place];Living will Healthcare Power of [place];Living will Living will;Healthcare Power of [employer] Power of [place];Living will   Does patient want to make changes to medical advance directive? No - Patient declined No - Patient declined    No - Patient declined Yes (ED - send information to MyChart)  Copy of Healthcare Power of Attorney in Chart? Yes - validated most recent copy scanned in chart (See row information) Yes - validated most recent copy scanned in chart (See row information) Yes - validated most recent copy scanned in chart (See row information)  No - copy requested, Physician notified No - copy requested   Would patient like information on creating a medical advance directive?     No - Patient declined      Current Medications (verified) Outpatient Encounter Medications as of 03/27/2024  Medication Sig   amLODipine  (NORVASC ) 10 MG tablet TAKE 1  TABLET EVERY NIGHT   Ascorbic Acid (VITAMIN C ER PO) Take 500 mg by mouth daily.   atorvastatin  (LIPITOR) 40 MG tablet Take 1 tablet (40 mg total) by mouth daily.   B Complex-Biotin-FA (B-COMPLEX PO) Take 1 tablet by mouth daily.   co-enzyme Q-10 30 MG capsule Take 30 mg by mouth daily.   ELIQUIS  5 MG TABS  tablet TAKE 1 TABLET TWICE DAILY   fluorouracil (EFUDEX) 5 % cream Apply topically 2 (two) times daily.   hydrochlorothiazide  (HYDRODIURIL ) 25 MG tablet Take 1 tablet (25 mg total) by mouth daily.   hydrocortisone 2.5 % cream Apply 1 application topically as needed (skin irritation).   losartan  (COZAAR ) 100 MG tablet Take 1 tablet (100 mg total) by mouth daily.   magnesium  oxide (MAG-OX) 400 MG tablet Take 800 mg by mouth daily.   Multiple Vitamins-Minerals (MULTIVITAMIN WITH MINERALS) tablet Take 1 tablet by mouth daily.   Omega-3 Fatty Acids (OMEGA-3 FISH OIL ) 1200 MG CAPS Take 2 capsules (2,400 mg total) by mouth daily. Take 2 1,200 mg capsules daily   polyethylene glycol (MIRALAX  / GLYCOLAX ) packet Take 17 g by mouth daily.   No facility-administered encounter medications on file as of 03/27/2024.    Allergies (verified) Patient has no known allergies.   History: Past Medical History:  Diagnosis Date   AAA (abdominal aortic aneurysm) (HCC)    Arthritis    Atrial fibrillation (HCC)    CAD (coronary artery disease)    had a MI , s/p CABG   Cataracts, bilateral    immature   CHF (congestive heart failure) (HCC)    Dysrhythmia    paroximal a fib   GERD (gastroesophageal reflux disease)    takes Omeprazole  daily   History of colon polyps    History of kidney stones    History of shingles    Hyperlipidemia    Hypertension    takes Amlodipine ,Metoprolol ,and Lisinopril  daily   Joint pain    Joint swelling    Myocardial infarction (HCC)    several with last one being 2001(but never knew about any except 2001)   OSA (obstructive sleep apnea)    started CPAP 12/09   Peripheral vascular disease (HCC)    Peyronie's disease    s/p penile implant in 2006   Pneumonia    as a child   Skin cancer    several , one of them was melanoma (sees derm routinely)   Sleep apnea    Tingling    feet occasionally   Tinnitus    Past Surgical History:  Procedure Laterality Date    ABDOMINAL AORTAGRAM N/A 06/16/2014   Procedure: ABDOMINAL Tommi Fraise;  Surgeon: Dannis Dy, MD;  Location[facility] CATH LAB;  Service: Cardiovascular;  Laterality: N/A;   ABDOMINAL AORTIC ENDOVASCULAR STENT GRAFT N/A 07/08/2014   Procedure: ABDOMINAL AORTIC ENDOVASCULAR STENT GRAFT/ GORE;  Surgeon: Dannis Dy, MD;  Location: [facility] OR;  Service: Vascular;  Laterality: N/A;   CARDIAC CATHETERIZATION  10/31/2013   COLONOSCOPY     CORONARY ARTERY BYPASS GRAFT  10/31/1997   x 3 vessels   LEFT HEART CATH AND CORS/GRAFTS ANGIOGRAPHY N/A 10/07/2021   Procedure: LEFT HEART CATH AND CORS/GRAFTS ANGIOGRAPHY;  Surgeon: Odie Benne, MD;  Location: [facility] INVASIVE CV LAB;  Service: Cardiovascular;  Laterality: N/A;   LEFT HEART CATHETERIZATION WITH CORONARY/GRAFT ANGIOGRAM N/A 05/29/2014   Procedure: LEFT HEART CATHETERIZATION WITH Estella Helling;  Surgeon: Darlis Eisenmenger, MD;  Location: [facility] CATH LAB;  Service: Cardiovascular;  Laterality: N/A;   PENILE PROSTHESIS IMPLANT  08/31/2005   AMS inflatable penile prosthesis   RHINOPLASTY  10/31/1973   Family History  Problem Relation Age of Onset   Prostate cancer Father 20   Heart disease Father    Skin cancer Father    Heart attack Father    Cancer Father    Deep vein thrombosis Father    Hyperlipidemia Father    Hypertension Father    Heart disease Mother    Skin cancer Mother    Heart attack Mother    Cancer Mother    Hyperlipidemia Mother    Hypertension Mother    Varicose Veins Mother    Peripheral vascular disease Mother        amputation   Sleep apnea Brother    Hyperlipidemia Brother    Hypertension Brother    Prostate cancer Brother 36   Colon cancer Other        aunt   Skin cancer Brother    Skin cancer Sister    Cancer Sister    Heart disease Sister    Diabetes Neg Hx    Stroke Neg Hx    Social History   Socioeconomic History   Marital status: Married    Spouse name: Not on file   Number of  children: 3   Years of education: 12   Highest education level: 12th grade  Occupational History   Occupation: retired, Research officer, political party  Tobacco Use   Smoking status: Former    Current packs/day: 0.00    Types: Cigarettes    Quit date: 1979    Years since quitting: 46.4   Smokeless tobacco: Never   Tobacco comments:    quit smoking in 1979  Vaping Use   Vaping status: Never Used  Substance and Sexual Activity   Alcohol use: Yes    Comment: occasionally - once per week   Drug use: No   Sexual activity: Not Currently  Other Topics Concern   Not on file  Social History Narrative   Lives w/ wife in a 2 story home.  Has one son and 2 stepchildren.  Retired for [employer].     Education: high school.   Social Drivers of Corporate investment banker Strain: Low Risk  (03/27/2024)   Overall Financial Resource Strain (CARDIA)    Difficulty of Paying Living Expenses: Not hard at all  Food Insecurity: No Food Insecurity (03/27/2024)   Hunger Vital Sign    Worried About Running Out of Food in the Last Year: Never true    Ran Out of Food in the Last Year: Never true  Transportation Needs: No Transportation Needs (02/06/2024)   PRAPARE - Administrator, Civil Service (Medical): No    Lack of Transportation (Non-Medical): No  Physical Activity: Sufficiently Active (03/27/2024)   Exercise Vital Sign    Days of Exercise per Week: 3 days    Minutes of Exercise per Session: 120 min  Stress: No Stress Concern Present (03/27/2024)   [employer] of Occupational Health - Occupational Stress Questionnaire    Feeling of Stress : Only a little  Social Connections: Moderately Integrated (03/27/2024)   Social Connection and Isolation Panel [NHANES]    Frequency of Communication with Friends and Family: More than three times a week    Frequency of Social Gatherings with Friends and Family: Twice a week    Attends Religious Services: Never    Database administrator or  Organizations: Yes     Attends Engineer, structural: More than 4 times per year    Marital Status: Married    Tobacco Counseling Counseling given: Not Answered Tobacco comments: quit smoking in 1979    Clinical Intake:  Pre-visit preparation completed: Yes  Pain : No/denies pain     BMI - recorded: 33.67 Nutritional Status: BMI > 30  Obese Nutritional Risks: None Diabetes: No  Lab Results  Component Value Date   HGBA1C 6.0 02/07/2024   HGBA1C 6.0 05/05/2023   HGBA1C 5.9 (H) 10/28/2022     How often do you need to have someone help you when you read instructions, pamphlets, or other written materials from your doctor or pharmacy?: 1 - Never What is the last grade level you completed in school?: Graduate  Interpreter Needed?: No  Information entered by :: Philip [name],CMA   Activities of Daily Living     03/27/2024    1:30 PM  In your present state of health, do you have any difficulty performing the following activities:  Hearing? 0  Vision? 0  Difficulty concentrating or making decisions? 0  Walking or climbing stairs? 0  Dressing or bathing? 0  Doing errands, shopping? 0  Preparing Food and eating ? N  Using the Toilet? N  In the past six months, have you accidently leaked urine? N  Do you have problems with loss of bowel control? N  Managing your Medications? N  Managing your Finances? N  Housekeeping or managing your Housekeeping? N    Patient Care Team: Ezell Hollow, MD as PCP - General (Internal Medicine) Lucendia Rusk, MD as PCP - Cardiology (Cardiology) Andrez Banker, MD as Attending Physician (Urology) Dannis Dy, MD (Inactive) as Consulting Physician (Vascular Surgery) Lind Repine, MD as Consulting Physician (Pulmonary Disease) Eduardo Grade, MD as Consulting Physician (Dermatology) Baldo Bonds, MD as Consulting Physician (Gastroenterology) Colie Dawes, MD as Attending Physician (Radiation Oncology) [place],  Nancyann Aye, RN as Oncology Nurse Navigator  Indicate any recent Medical Services you may have received from other than [facility] providers in the past year (date may be approximate).     Assessment:    This is a routine wellness examination for Philip [name].  Hearing/Vision screen Hearing Screening - Comments:: Patient has no difficulties with hearing   Vision Screening - Comments:: No vision difficulties    Goals Addressed             This Visit's Progress    Patient Stated   On track    Maintain current health       Depression Screen     03/27/2024    1:31 PM 02/07/2024    8:22 AM 02/07/2024    8:20 AM 09/08/2023    8:12 AM 05/05/2023    8:02 AM 03/07/2023    3:39 PM 01/30/2023    2:03 PM  PHQ 2/9 Scores  PHQ - 2 Score 0 0 0 0 0 0 0  PHQ- 9 Score 1 0  0 0      Fall Risk     03/27/2024    1:30 PM 02/07/2024    8:20 AM 09/08/2023    8:12 AM 05/05/2023    8:00 AM 03/07/2023    3:38 PM  Fall Risk   Falls in the past year? 0 0 0 0 0  Number falls in past yr: 0 0 0 0 0  Injury with Fall? 0 0 0 0 0  Risk for fall  due to : No Fall Risks    No Fall Risks  Follow up Falls evaluation completed Falls evaluation completed;Education provided Falls evaluation completed Falls evaluation completed Falls evaluation completed    MEDICARE RISK AT HOME:  Medicare Risk at Home Any stairs in or around the home?: Yes If so, are there any without handrails?: No Home free of loose throw rugs in walkways, pet beds, electrical cords, etc?: Yes Adequate lighting in your home to reduce risk of falls?: Yes Life alert?: No Use of a cane, walker or w/c?: No Grab bars in the bathroom?: Yes Shower chair or bench in shower?: Yes Elevated toilet seat or a handicapped toilet?: Yes  TIMED UP AND GO:  Was the test performed?  No  Cognitive Function: 6CIT completed    12/06/2016    3:22 PM  MMSE - Mini Mental State Exam  Orientation to time 5  Orientation to Place 5  Registration 3  Attention/ Calculation 5   Recall 3  Language- name 2 objects 2  Language- repeat 1  Language- follow 3 step command 3  Language- read & follow direction 1  Write a sentence 1  Copy design 1  Total score 30        03/27/2024    1:28 PM 03/07/2023    3:44 PM  6CIT Screen  What Year? 0 points 0 points  What month? 0 points 0 points  What time? 0 points 0 points  Count back from 20 0 points 0 points  Months in reverse 0 points 0 points  Repeat phrase 0 points 0 points  Total Score 0 points 0 points    Immunizations Immunization History  Administered Date(s) Administered   Fluad Quad(high Dose 65+) 08/19/2020, 08/23/2021   H1N1 11/06/2008   Influenza Split 09/15/2011, 09/14/2012   Influenza Whole 08/06/2010   Influenza, High Dose Seasonal PF 09/16/2015, 09/26/2016, 09/01/2017, 09/03/2018, 08/29/2019   Influenza,inj,Quad PF,6+ Mos 10/01/2013   Influenza-Unspecified 08/31/2014   PFIZER(Purple Top)SARS-COV-2 Vaccination 12/07/2019, 01/01/2020   Pneumococcal Conjugate-13 04/22/2014   Pneumococcal Polysaccharide-23 01/01/2010, 10/18/2017   Td 04/22/2014   Zoster Recombinant(Shingrix) 09/04/2018, 03/04/2022   Zoster, Live 10/31/2012    Screening Tests Health Maintenance  Topic Date Due   DTaP/Tdap/Td (2 - Tdap) 04/22/2024   INFLUENZA VACCINE  05/31/2024   Medicare Annual Wellness (AWV)  03/27/2025   Pneumonia Vaccine 35+ Years old  Completed   Zoster Vaccines- Shingrix  Completed   HPV VACCINES  Aged Out   Meningococcal B Vaccine  Aged Out   COVID-19 Vaccine  Discontinued    Health Maintenance  There are no preventive care reminders to display for this patient. Health Maintenance Items Addressed:   Additional Screening:  Vision Screening: Recommended annual ophthalmology exams for early detection of glaucoma and other disorders of the eye.  Dental Screening: Recommended annual dental exams for proper oral hygiene  Community Resource Referral / Chronic Care Management: CRR required this  visit?  No   CCM required this visit?  No   Plan:    I have personally reviewed and noted the following in the patient's chart:   Medical and social history Use of alcohol, tobacco or illicit drugs  Current medications and supplements including opioid prescriptions. Patient is not currently taking opioid prescriptions. Functional ability and status Nutritional status Physical activity Advanced directives List of other physicians Hospitalizations, surgeries, and ER visits in previous 12 months Vitals Screenings to include cognitive, depression, and falls Referrals and appointments  In addition, I have  reviewed and discussed with patient certain preventive protocols, quality metrics, and best practice recommendations. A written personalized care plan for preventive services as well as general preventive health recommendations were provided to patient.   Philip [name], [place]   03/27/2024   After Visit Summary: (MyChart) Due to this being a telephonic visit, the after visit summary with patients personalized plan was offered to patient via MyChart   Notes: Nothing significant to report at this time.

## 2024-03-27 NOTE — Addendum Note (Signed)
 Addended by: Yara Tomkinson K on: 03/27/2024 04:31 PM   Modules accepted: Level of Service

## 2024-04-12 DIAGNOSIS — C4442 Squamous cell carcinoma of skin of scalp and neck: Secondary | ICD-10-CM | POA: Diagnosis not present

## 2024-04-25 ENCOUNTER — Other Ambulatory Visit: Payer: Self-pay | Admitting: Interventional Cardiology

## 2024-04-29 ENCOUNTER — Telehealth: Payer: Self-pay | Admitting: Interventional Cardiology

## 2024-04-29 DIAGNOSIS — R0609 Other forms of dyspnea: Secondary | ICD-10-CM

## 2024-04-29 DIAGNOSIS — R5383 Other fatigue: Secondary | ICD-10-CM

## 2024-04-29 DIAGNOSIS — I2510 Atherosclerotic heart disease of native coronary artery without angina pectoris: Secondary | ICD-10-CM

## 2024-04-29 MED ORDER — AMLODIPINE BESYLATE 10 MG PO TABS: 10.0000 mg | ORAL_TABLET | Freq: Every evening | ORAL | 0 refills | Status: DC

## 2024-04-29 MED ORDER — ATORVASTATIN CALCIUM 40 MG PO TABS
40.0000 mg | ORAL_TABLET | Freq: Every day | ORAL | 0 refills | Status: DC
Start: 2024-04-29 — End: 2024-05-27

## 2024-04-29 NOTE — Telephone Encounter (Signed)
*  STAT* If patient is at the pharmacy, call can be transferred to refill team.   1. Which medications need to be refilled? (please list name of each medication and dose if known)   amLODipine  (NORVASC ) 10 MG tablet  atorvastatin  (LIPITOR) 40 MG tablet   2. Would you like to learn more about the convenience, safety, & potential cost savings by using the [facility]?   3. Are you open to using the [facility] (Type [facility]. ).  4. Which pharmacy/location (including street and city if local pharmacy) is medication to be sent to?  [facility] - [place], [place] - [address]   5. Do they need a 30 day or 90 day supply?   90 day  Patient stated he has 4 days left of these medications.

## 2024-04-29 NOTE — Telephone Encounter (Signed)
 Pt's medications were sent to pt's pharmacy as requested. Confirmation received.

## 2024-05-13 DIAGNOSIS — C44622 Squamous cell carcinoma of skin of right upper limb, including shoulder: Secondary | ICD-10-CM | POA: Diagnosis not present

## 2024-05-13 DIAGNOSIS — L821 Other seborrheic keratosis: Secondary | ICD-10-CM | POA: Diagnosis not present

## 2024-05-13 DIAGNOSIS — Z08 Encounter for follow-up examination after completed treatment for malignant neoplasm: Secondary | ICD-10-CM | POA: Diagnosis not present

## 2024-05-13 DIAGNOSIS — Z85828 Personal history of other malignant neoplasm of skin: Secondary | ICD-10-CM | POA: Diagnosis not present

## 2024-05-13 DIAGNOSIS — D485 Neoplasm of uncertain behavior of skin: Secondary | ICD-10-CM | POA: Diagnosis not present

## 2024-05-13 DIAGNOSIS — L814 Other melanin hyperpigmentation: Secondary | ICD-10-CM | POA: Diagnosis not present

## 2024-05-13 DIAGNOSIS — Z872 Personal history of diseases of the skin and subcutaneous tissue: Secondary | ICD-10-CM | POA: Diagnosis not present

## 2024-05-13 DIAGNOSIS — D225 Melanocytic nevi of trunk: Secondary | ICD-10-CM | POA: Diagnosis not present

## 2024-05-13 DIAGNOSIS — C44219 Basal cell carcinoma of skin of left ear and external auricular canal: Secondary | ICD-10-CM | POA: Diagnosis not present

## 2024-05-22 ENCOUNTER — Ambulatory Visit (INDEPENDENT_AMBULATORY_CARE_PROVIDER_SITE_OTHER): Admitting: Internal Medicine

## 2024-05-22 ENCOUNTER — Ambulatory Visit: Payer: Self-pay | Admitting: Internal Medicine

## 2024-05-22 ENCOUNTER — Encounter: Payer: Self-pay | Admitting: Internal Medicine

## 2024-05-22 VITALS — BP 148/86 | HR 73 | Temp 97.9°F | Resp 16 | Ht 69.0 in | Wt 230.0 lb

## 2024-05-22 DIAGNOSIS — E038 Other specified hypothyroidism: Secondary | ICD-10-CM | POA: Diagnosis not present

## 2024-05-22 DIAGNOSIS — Z0000 Encounter for general adult medical examination without abnormal findings: Secondary | ICD-10-CM

## 2024-05-22 DIAGNOSIS — I10 Essential (primary) hypertension: Secondary | ICD-10-CM | POA: Diagnosis not present

## 2024-05-22 DIAGNOSIS — Z0001 Encounter for general adult medical examination with abnormal findings: Secondary | ICD-10-CM

## 2024-05-22 DIAGNOSIS — H6121 Impacted cerumen, right ear: Secondary | ICD-10-CM

## 2024-05-22 DIAGNOSIS — R739 Hyperglycemia, unspecified: Secondary | ICD-10-CM | POA: Diagnosis not present

## 2024-05-22 DIAGNOSIS — I2510 Atherosclerotic heart disease of native coronary artery without angina pectoris: Secondary | ICD-10-CM | POA: Diagnosis not present

## 2024-05-22 DIAGNOSIS — E782 Mixed hyperlipidemia: Secondary | ICD-10-CM | POA: Diagnosis not present

## 2024-05-22 LAB — CBC WITH DIFFERENTIAL/PLATELET
Basophils Absolute: 0 K/uL (ref 0.0–0.1)
Basophils Relative: 0.5 % (ref 0.0–3.0)
Eosinophils Absolute: 0.1 K/uL (ref 0.0–0.7)
Eosinophils Relative: 1.3 % (ref 0.0–5.0)
HCT: 41.9 % (ref 39.0–52.0)
Hemoglobin: 14.5 g/dL (ref 13.0–17.0)
Lymphocytes Relative: 49.4 % — ABNORMAL HIGH (ref 12.0–46.0)
Lymphs Abs: 2.2 K/uL (ref 0.7–4.0)
MCHC: 34.5 g/dL (ref 30.0–36.0)
MCV: 91.6 fl (ref 78.0–100.0)
Monocytes Absolute: 0.7 K/uL (ref 0.1–1.0)
Monocytes Relative: 16.4 % — ABNORMAL HIGH (ref 3.0–12.0)
Neutro Abs: 1.5 K/uL (ref 1.4–7.7)
Neutrophils Relative %: 32.4 % — ABNORMAL LOW (ref 43.0–77.0)
Platelets: 119 K/uL — ABNORMAL LOW (ref 150.0–400.0)
RBC: 4.58 Mil/uL (ref 4.22–5.81)
RDW: 14.1 % (ref 11.5–15.5)
WBC: 4.5 K/uL (ref 4.0–10.5)

## 2024-05-22 LAB — BASIC METABOLIC PANEL WITH GFR
BUN: 21 mg/dL (ref 6–23)
CO2: 32 meq/L (ref 19–32)
Calcium: 10 mg/dL (ref 8.4–10.5)
Chloride: 97 meq/L (ref 96–112)
Creatinine, Ser: 1.47 mg/dL (ref 0.40–1.50)
GFR: 43.68 mL/min — ABNORMAL LOW (ref >60.00)
Glucose, Bld: 94 mg/dL (ref 70–99)
Potassium: 3.8 meq/L (ref 3.5–5.1)
Sodium: 138 meq/L (ref 135–145)

## 2024-05-22 LAB — LIPID PANEL
Cholesterol: 136 mg/dL (ref 0–200)
HDL: 43.9 mg/dL (ref >39.00)
LDL Cholesterol: 25 mg/dL (ref 0–99)
NonHDL: 91.9
Total CHOL/HDL Ratio: 3
Triglycerides: 336 mg/dL — ABNORMAL HIGH (ref 0.0–149.0)
VLDL: 67.2 mg/dL — ABNORMAL HIGH (ref 0.0–40.0)

## 2024-05-22 LAB — T4, FREE: Free T4: 0.7 ng/dL (ref 0.60–1.60)

## 2024-05-22 LAB — MAGNESIUM: Magnesium: 2 mg/dL (ref 1.5–2.5)

## 2024-05-22 LAB — TSH: TSH: 5.21 u[IU]/mL (ref 0.35–5.50)

## 2024-05-22 NOTE — Progress Notes (Signed)
 Subjective:    Patient ID: Philip [name], male    DOB: 1940-11-25, 83 y.o.   MRN: [id number]  DOS:  05/22/2024 Type of visit - description: CPX  Here for CPX. Chronic medical problems addressed. His main concern today is decreased hearing on the right side for the last 2 months. Denies any pain or discharge. 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 Has tried to self flush the ear without much success.  He is anticoagulated, denies any blood in the urine or in the stools. No chest pain no difficulty breathing No edema  BP Readings from Last 3 Encounters:  05/22/24 (!) 148/86  03/27/24 130/82  02/07/24 130/82     Review of Systems  Other than above, a 14 point review of systems is negative    Past Medical History:  Diagnosis Date   AAA (abdominal aortic aneurysm) (HCC)    Arthritis    Atrial fibrillation (HCC)    CAD (coronary artery disease)    had a MI , s/p CABG   Cataracts, bilateral    immature   CHF (congestive heart failure) (HCC)    Dysrhythmia    paroximal a fib   GERD (gastroesophageal reflux disease)    takes Omeprazole  daily   History of colon polyps    History of kidney stones    History of shingles    Hyperlipidemia    Hypertension    takes Amlodipine ,Metoprolol ,and Lisinopril  daily   Joint pain    Joint swelling    Myocardial infarction (HCC)    several with last one being 2001(but never knew about any except 2001)   OSA (obstructive sleep apnea)    started CPAP 12/09   Peripheral vascular disease (HCC)    Peyronie's disease    s/p penile implant in 2006   Pneumonia    as a child   Skin cancer    several , one of them was melanoma (sees derm routinely)   Sleep apnea    Tingling    feet occasionally   Tinnitus     Past Surgical History:  Procedure Laterality Date   ABDOMINAL AORTAGRAM N/A 06/16/2014   Procedure: ABDOMINAL EZELLA;  Surgeon: Lonni GORMAN Blade, MD;  Location: [facility] CATH LAB;  Service:  Cardiovascular;  Laterality: N/A;   ABDOMINAL AORTIC ENDOVASCULAR STENT GRAFT N/A 07/08/2014   Procedure: ABDOMINAL AORTIC ENDOVASCULAR STENT GRAFT/ GORE;  Surgeon: Lonni GORMAN Blade, MD;  Location: [facility] OR;  Service: Vascular;  Laterality: N/A;   CARDIAC CATHETERIZATION  10/31/2013   COLONOSCOPY     CORONARY ARTERY BYPASS GRAFT  10/31/1997   x 3 vessels   LEFT HEART CATH AND CORS/GRAFTS ANGIOGRAPHY N/A 10/07/2021   Procedure: LEFT HEART CATH AND CORS/GRAFTS ANGIOGRAPHY;  Surgeon: Verlin Lonni BIRCH, MD;  Location: [facility] INVASIVE CV LAB;  Service: Cardiovascular;  Laterality: N/A;   LEFT HEART CATHETERIZATION WITH CORONARY/GRAFT ANGIOGRAM N/A 05/29/2014   Procedure: LEFT HEART CATHETERIZATION WITH EL BILE;  Surgeon: Ezra GORMAN Shuck, MD;  Location: [facility] CATH LAB;  Service: Cardiovascular;  Laterality: N/A;   PENILE PROSTHESIS IMPLANT  08/31/2005   AMS inflatable penile prosthesis   RHINOPLASTY  10/31/1973    Current Outpatient Medications  Medication Instructions   amLODipine  (NORVASC ) 10 mg, Oral, Nightly   Ascorbic Acid (VITAMIN C ER PO) 500 mg, Daily   atorvastatin  (LIPITOR) 40 mg, Oral, Daily   B Complex-Biotin-FA (B-COMPLEX PO) 1 tablet, Daily   co-enzyme Q-10 30 mg, Daily  Eliquis  5 mg, Oral, 2 times daily   fluorouracil (EFUDEX) 5 % cream 2 times daily   hydrochlorothiazide  (HYDRODIURIL ) 25 mg, Oral, Daily   hydrocortisone 2.5 % cream 1 application , As needed   losartan  (COZAAR ) 100 mg, Oral, Daily   magnesium  oxide (MAG-OX) 800 mg, Daily   Multiple Vitamins-Minerals (MULTIVITAMIN WITH MINERALS) tablet 1 tablet, Daily   Omega-3 Fish Oil  2,400 mg, Daily   polyethylene glycol (MIRALAX  / GLYCOLAX ) 17 g, Daily       Objective:   Physical Exam BP (!) 148/86   Pulse 73   Temp 97.9 F (36.6 C) (Oral)   Resp 16   Ht 5' 9 (1.753 m)   Wt 230 lb (104.3 kg)   SpO2 96%   BMI 33.97 kg/m  General: Well developed, NAD, BMI noted Neck: No  thyromegaly   HEENT:  Normocephalic . Face symmetric, atraumatic. Left ear: Normal R ear: Cerumen impaction.  Unable to remove much wax with a spoon. Lungs:  CTA B Normal respiratory effort, no intercostal retractions, no accessory muscle use. Heart: RRR,  no murmur.  Abdomen:  Not distended, soft, non-tender. No rebound or rigidity.   Lower extremities: no pretibial edema bilaterally  Skin: Multiple different skin lesions noted.   Neurologic:  alert & oriented X3.  Speech normal, gait appropriate for age and unassisted Strength symmetric and appropriate for age.  Psych: Cognition and judgment appear intact.  Cooperative with normal attention span and concentration.  Behavior appropriate. No anxious or depressed appearing.     Assessment     Assessment Hyperglycemia: A1c 6.1  12/2016 Neuropathy : saw neuro 5-18, likely idiopathic, declined NCS; also UE entrapment neuropathy likely HTN Hyperlipidemia CV: ---CAD >>>  MI, CABG (1999); NSTEMI 09-2021   ---CHF ---Peripheral vascular disease ---AAA - s/p EVAR/PAD, Dr Eliza  --- A. 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  -Cysts pancreas:   MRI 11/2023, no f/u needed  Obesity- 1st visit Dr Verdon 01-18-17  PLAN: Here for CPX -Td:2015 - pnm 23: 2018;  prevnar :2015.     - s/p shingrix ,s/p zostavax ( [facility])  -Vaccines are recommended: PNM 20, flu shot every fall, COVID booster,   RSV -CCS: C-scope 2007 aprox. polyps x 6 ,  03-2009, hyperplastic polyps. 09-2014: polyps, C-scope  08/16/2019, see report, + polyps, see pathology report, apparently no recall (as he was told) Prostate cancer screening: History of elevated PSA, LOV urology April 2025.  Recommend follow-up in 6 months. -Labs:BMP FLP CBC TSH free  T4 -Diet and exercise: Remains active, goes to the gym 3 times a week without symptoms.  I feel better after I exercise. -POA: On file  Other issues: Hyperglycemia: Has a healthy lifestyle, last A1c 6.0.  Reassess on RTC HTN: BP is typically okay, today is 148/86.  Ambulatory BPs typically in the 130s with occasionally in the 140s. Diastolic BP 75-80. Plan: Labs, continue amlodipine , HCTZ, losartan . Monitor BPs carefully, call if not at goal. See AVS. High cholesterol: On Lipitor 40 mg, last LFTs normal, check FLP. CAD, CHF, A-fib paroxysmal: Seems to be doing well, anticoagulated, no bleeding .  Due to see cardiology, referral sent. OSA.  Reports good CPAP compliance Cerumen impaction: Had a lavage, moderate amount of wax removed. Subsequently I removed more with a  spoon.  Had a minor bleeding so elected not to continue using the spoon. Plan: Peroxide regularly, call if any continued bleeding, has pain or discharge. Difficulty hearing completely resolved after ear lavage  RTC 4 months

## 2024-05-22 NOTE — Patient Instructions (Addendum)
 Vaccines are recommended: Tdap PNM 20 (pneumonia shot) Flu shot this fall Consider COVID booster RSV  Continue checking your blood pressure; please check daily.   Blood pressure goal:  between 110/65 and  135/85.  Call or send a message in 2 weeks with your blood pressure readings.  We might need to adjust your medication    GO TO THE LAB :  Get the blood work   Your results will be posted on MyChart with my comments  Next office visit for a checkup in 4 months Please make an appointment before you leave today

## 2024-05-22 NOTE — Assessment & Plan Note (Signed)
 Here for CPX -Td:2015 - pnm 23: 2018;  prevnar :2015.     - s/p shingrix ,s/p zostavax ( [employer])  -Vaccines are recommended: PNM 20, flu shot every fall, COVID booster,   RSV -CCS: C-scope 2007 aprox. polyps x 6 ,  03-2009, hyperplastic polyps. 09-2014: polyps, C-scope  08/16/2019, see report, + polyps, see pathology report, apparently no recall (as he was told) Prostate cancer screening: History of elevated PSA, LOV urology April 2025.  Recommend follow-up in 6 months. -Labs:BMP FLP CBC TSH free T4 -Diet and exercise: Remains active, goes to the gym 3 times a week without symptoms.  I feel better after I exercise. -POA: On file

## 2024-05-22 NOTE — Assessment & Plan Note (Signed)
 Here for CPX   Other issues: Hyperglycemia: Has a healthy lifestyle, last A1c 6.0.  Reassess on RTC HTN: BP is typically okay, today is 148/86.  Ambulatory BPs typically in the 130s with occasionally in the 140s. Diastolic BP 75-80. Plan: Labs, continue amlodipine , HCTZ, losartan . Monitor BPs carefully, call if not at goal. See AVS. High cholesterol: On Lipitor 40 mg, last LFTs normal, check FLP. CAD, CHF, A-fib paroxysmal: Seems to be doing well, anticoagulated, no bleeding .  Due to see cardiology, referral sent. OSA.  Reports good CPAP compliance Cerumen impaction: Had a lavage, moderate amount of wax removed. Subsequently I removed more with a spoon.  Had a minor bleeding so elected not to continue using the spoon. Plan: Peroxide regularly, call if any continued bleeding, has pain or discharge. Difficulty hearing completely resolved after ear lavage  RTC 4 months

## 2024-05-27 ENCOUNTER — Telehealth: Payer: Self-pay | Admitting: Cardiovascular Disease

## 2024-05-27 DIAGNOSIS — R0609 Other forms of dyspnea: Secondary | ICD-10-CM

## 2024-05-27 DIAGNOSIS — R5383 Other fatigue: Secondary | ICD-10-CM

## 2024-05-27 DIAGNOSIS — I2510 Atherosclerotic heart disease of native coronary artery without angina pectoris: Secondary | ICD-10-CM

## 2024-05-27 MED ORDER — AMLODIPINE BESYLATE 10 MG PO TABS: 10.0000 mg | ORAL_TABLET | Freq: Every evening | ORAL | 0 refills | Status: DC

## 2024-05-27 MED ORDER — ATORVASTATIN CALCIUM 40 MG PO TABS
40.0000 mg | ORAL_TABLET | Freq: Every day | ORAL | 0 refills | Status: DC
Start: 2024-05-27 — End: 2024-06-10

## 2024-05-27 NOTE — Telephone Encounter (Signed)
*  STAT* If patient is at the pharmacy, call can be transferred to refill team.   1. Which medications need to be refilled? (please list name of each medication and dose if known)   atorvastatin  (LIPITOR) 40 MG tablet  amLODipine  (NORVASC ) 10 MG tablet   2. Would you like to learn more about the convenience, safety, & potential cost savings by using the [facility]?   3. Are you open to using the [facility] (Type [facility]. ).  4. Which pharmacy/location (including street and city if local pharmacy) is medication to be sent to?  [id number] - [place] ([place]), [place] - [address]   5. Do they need a 30 day or 90 day supply?   Patient stated he is almost out of these medication.  Patient has an appointment scheduled on 8/11 with Dr. Court.

## 2024-05-29 DIAGNOSIS — C44622 Squamous cell carcinoma of skin of right upper limb, including shoulder: Secondary | ICD-10-CM | POA: Diagnosis not present

## 2024-05-29 DIAGNOSIS — L905 Scar conditions and fibrosis of skin: Secondary | ICD-10-CM | POA: Diagnosis not present

## 2024-05-29 DIAGNOSIS — C44612 Basal cell carcinoma of skin of right upper limb, including shoulder: Secondary | ICD-10-CM | POA: Diagnosis not present

## 2024-06-10 ENCOUNTER — Ambulatory Visit: Attending: Cardiovascular Disease | Admitting: Cardiovascular Disease

## 2024-06-10 ENCOUNTER — Encounter: Payer: Self-pay | Admitting: Cardiovascular Disease

## 2024-06-10 VITALS — BP 154/62 | HR 73 | Ht 69.0 in | Wt 235.0 lb

## 2024-06-10 DIAGNOSIS — I10 Essential (primary) hypertension: Secondary | ICD-10-CM | POA: Diagnosis not present

## 2024-06-10 DIAGNOSIS — I2510 Atherosclerotic heart disease of native coronary artery without angina pectoris: Secondary | ICD-10-CM

## 2024-06-10 DIAGNOSIS — E782 Mixed hyperlipidemia: Secondary | ICD-10-CM

## 2024-06-10 DIAGNOSIS — I7143 Infrarenal abdominal aortic aneurysm, without rupture: Secondary | ICD-10-CM

## 2024-06-10 DIAGNOSIS — R0609 Other forms of dyspnea: Secondary | ICD-10-CM | POA: Diagnosis not present

## 2024-06-10 DIAGNOSIS — R5383 Other fatigue: Secondary | ICD-10-CM | POA: Diagnosis not present

## 2024-06-10 DIAGNOSIS — Z4802 Encounter for removal of sutures: Secondary | ICD-10-CM | POA: Diagnosis not present

## 2024-06-10 MED ORDER — ATORVASTATIN CALCIUM 40 MG PO TABS
40.0000 mg | ORAL_TABLET | Freq: Every day | ORAL | 4 refills | Status: DC
Start: 2024-06-10 — End: 2024-07-22

## 2024-06-10 MED ORDER — AMLODIPINE BESYLATE 10 MG PO TABS: 10.0000 mg | ORAL_TABLET | Freq: Every evening | ORAL | 4 refills | Status: DC

## 2024-06-10 NOTE — Assessment & Plan Note (Signed)
 History of endoluminal stent grafting by Dr. Eliza  in 2015.  He is followed by the vein and vascular specialist noninvasively on annual basis.

## 2024-06-10 NOTE — Progress Notes (Signed)
 06/10/2024 DAMASCUS FELDPAUSCH   1941-05-18  [id number]  Primary Physician [name], MD Primary Cardiologist: Dorn JINNY Lesches MD GENI CODY MADEIRA, FSCAI  HPI:  Philip [name] is a 83 y.o. moderately overweight married Caucasian male father of 3, grandfather 7 grandchildren who is retired from the [employer], political party.  He was formally a patient of Dr. Dann.  I am assuming his care.  His risk factors include treated hypertension and hyperlipidemia.  He has never had a stroke but has had bypass grafting in the past and [place] [facility] in 1999.  He is also had endoluminal stent grafting for an abdominal aortic aneurysm by Dr. Eliza  in 2015.  He has a history of PAF maintaining sinus rhythm on Eliquis  oral anticoagulation.  He is fairly active and works out in [employer] 3 days a week for an hour at a time on cardio and weights.  He plays golf twice a week.  He is completely asymptomatic.  He does complain of left lower extremity claudication however.   Current Meds  Medication Sig   amLODipine  (NORVASC ) 10 MG tablet Take 1 tablet (10 mg total) by mouth at bedtime.   Ascorbic Acid (VITAMIN C ER PO) Take 500 mg by mouth daily.   atorvastatin  (LIPITOR) 40 MG tablet Take 1 tablet (40 mg total) by mouth daily.   B Complex-Biotin-FA (B-COMPLEX PO) Take 1 tablet by mouth daily.   co-enzyme Q-10 30 MG capsule Take 30 mg by mouth daily.   ELIQUIS  5 MG TABS tablet TAKE 1 TABLET TWICE DAILY   fluorouracil (EFUDEX) 5 % cream Apply topically 2 (two) times daily.   hydrochlorothiazide  (HYDRODIURIL ) 25 MG tablet Take 1 tablet (25 mg total) by mouth daily.   losartan  (COZAAR ) 100 MG tablet Take 1 tablet (100 mg total) by mouth daily.   magnesium  oxide (MAG-OX) 400 MG tablet Take 800 mg by mouth daily.   Multiple Vitamins-Minerals (MULTIVITAMIN WITH MINERALS) tablet Take 1 tablet by mouth daily.   Omega-3 Fatty Acids (OMEGA-3 FISH OIL ) 1200 MG CAPS Take 2 capsules (2,400 mg total) by mouth daily. Take 2  1,200 mg capsules daily   polyethylene glycol (MIRALAX  / GLYCOLAX ) packet Take 17 g by mouth daily.     No Known Allergies  Social History   Socioeconomic History   Marital status: Married    Spouse name: Not on file   Number of children: 3   Years of education: 12   Highest education level: 12th grade  Occupational History   Occupation: retired, Research officer, political party  Tobacco Use   Smoking status: Former    Current packs/day: 0.00    Types: Cigarettes    Quit date: 1979    Years since quitting: 46.6   Smokeless tobacco: Never   Tobacco comments:    quit smoking in 1979  Vaping Use   Vaping status: Never Used  Substance and Sexual Activity   Alcohol use: Yes    Comment: occasionally - once per week   Drug use: No   Sexual activity: Not Currently  Other Topics Concern   Not on file  Social History Narrative   Lives w/ wife in a 2 story home.     Has one son and 2 stepchildren.  Retired for [employer].     Education: high school.   Social Drivers of Health   Financial Resource Strain: Low Risk  (05/15/2024)   Overall Financial Resource Strain (CARDIA)    Difficulty of  Paying Living Expenses: Not hard at all  Food Insecurity: No Food Insecurity (05/15/2024)   Hunger Vital Sign    Worried About Running Out of Food in the Last Year: Never true    Ran Out of Food in the Last Year: Never true  Transportation Needs: No Transportation Needs (05/15/2024)   PRAPARE - Administrator, Civil Service (Medical): No    Lack of Transportation (Non-Medical): No  Physical Activity: Sufficiently Active (05/15/2024)   Exercise Vital Sign    Days of Exercise per Week: 3 days    Minutes of Exercise per Session: 120 min  Stress: No Stress Concern Present (05/15/2024)   [employer] of Occupational Health - Occupational Stress Questionnaire    Feeling of Stress: Not at all  Social Connections: Moderately Integrated (05/15/2024)   Social Connection and Isolation Panel    Frequency of  Communication with Friends and Family: More than three times a week    Frequency of Social Gatherings with Friends and Family: Twice a week    Attends Religious Services: Never    Database administrator or Organizations: Yes    Attends Engineer, structural: More than 4 times per year    Marital Status: Married  Catering manager Violence: Not At Risk (03/27/2024)   Humiliation, Afraid, Rape, and Kick questionnaire    Fear of Current or Ex-Partner: No    Emotionally Abused: No    Physically Abused: No    Sexually Abused: No     Review of Systems: General: negative for chills, fever, night sweats or weight changes.  Cardiovascular: negative for chest pain, dyspnea on exertion, edema, orthopnea, palpitations, paroxysmal nocturnal dyspnea or shortness of breath Dermatological: negative for rash Respiratory: negative for cough or wheezing Urologic: negative for hematuria Abdominal: negative for nausea, vomiting, diarrhea, bright red blood per rectum, melena, or hematemesis Neurologic: negative for visual changes, syncope, or dizziness All other systems reviewed and are otherwise negative except as noted above.    Blood pressure (!) 154/62, pulse 73, height 5' 9 (1.753 m), weight 235 lb (106.6 kg), SpO2 98%.  General appearance: alert and no distress Neck: no adenopathy, no carotid bruit, no JVD, supple, symmetrical, trachea midline, and thyroid  not enlarged, symmetric, no tenderness/mass/nodules Lungs: clear to auscultation bilaterally Heart: regular rate and rhythm, S1, S2 normal, no murmur, click, rub or gallop Extremities: extremities normal, atraumatic, no cyanosis or edema Pulses: 2+ and symmetric Skin: Skin color, texture, turgor normal. No rashes or lesions Neurologic: Grossly normal  EKG EKG Interpretation Date/Time:  Monday June 10 2024 14:35:26 EDT Ventricular Rate:  73 PR Interval:  208 QRS Duration:  124 QT Interval:  402 QTC Calculation: 442 R  Axis:   -22  Text Interpretation: Sinus rhythm with occasional Premature ventricular complexes Left ventricular hypertrophy with QRS widening ( R in aVL , Cornell product ) When compared with ECG of 07-Oct-2021 12:45, PR interval has decreased Confirmed by Court Carrier [phone]) on 06/10/2024 2:37:03 PM    ASSESSMENT AND PLAN:   Mixed hyperlipidemia History of hyperlipidemia on statin therapy with lipid profile performed 05/22/2024 revealing total cholesterol 136, LDL 25 and HDL of 43.  Essential hypertension History of essential hypertension with blood pressure measured today 154/62.  He is on HydroDIURIL  and losartan .  Coronary atherosclerosis History of CAD status post bypass grafting in [place] in 1999.  His last cardiac catheterization was performed in December 2022 by Dr. Verlin revealing occluded native vessels with patent grafts.  He is asymptomatic.  AAA (abdominal aortic aneurysm) (HCC) History of endoluminal stent grafting by Dr. Eliza  in 2015.  He is followed by the vein and vascular specialist noninvasively on annual basis.     Dorn DOROTHA Lesches MD FACP,FACC,FAHA, [facility] 06/10/2024 2:50 PM

## 2024-06-10 NOTE — Assessment & Plan Note (Signed)
 History of CAD status post bypass grafting in [facility] in 1999.  His last cardiac catheterization was performed in December 2022 by Dr. Verlin revealing occluded native vessels with patent grafts.  He is asymptomatic.

## 2024-06-10 NOTE — Assessment & Plan Note (Signed)
 History of essential hypertension with blood pressure measured today 154/62.  He is on HydroDIURIL  and losartan .

## 2024-06-10 NOTE — Assessment & Plan Note (Signed)
 History of hyperlipidemia on statin therapy with lipid profile performed 05/22/2024 revealing total cholesterol 136, LDL 25 and HDL of 43.

## 2024-06-10 NOTE — Patient Instructions (Signed)
 Medication Instructions:  Your physician recommends that you continue on your current medications as directed. Please refer to the Current Medication list given to you today.  *If you need a refill on your cardiac medications before your next appointment, please call your pharmacy*   Follow-Up: At [facility], you and your health needs are our priority.  As part of our continuing mission to provide you with exceptional heart care, our providers are all part of one team.  This team includes your primary Cardiologist (physician) and Advanced Practice Providers or APPs (Physician Assistants and Nurse Practitioners) who all work together to provide you with the care you need, when you need it.  Your next appointment:   12 month(s)  Provider:   Dorn Lesches, MD    We recommend signing up for the patient portal called MyChart.  Sign up information is provided on this After Visit Summary.  MyChart is used to connect with patients for Virtual Visits (Telemedicine).  Patients are able to view lab/test results, encounter notes, upcoming appointments, etc.  Non-urgent messages can be sent to your provider as well.   To learn more about what you can do with MyChart, go to [email or web address].

## 2024-06-14 DIAGNOSIS — C44219 Basal cell carcinoma of skin of left ear and external auricular canal: Secondary | ICD-10-CM | POA: Diagnosis not present

## 2024-06-17 ENCOUNTER — Encounter (HOSPITAL_COMMUNITY): Payer: Self-pay

## 2024-06-17 ENCOUNTER — Emergency Department (HOSPITAL_COMMUNITY)

## 2024-06-17 ENCOUNTER — Observation Stay (HOSPITAL_COMMUNITY)

## 2024-06-17 ENCOUNTER — Other Ambulatory Visit: Payer: Self-pay

## 2024-06-17 ENCOUNTER — Inpatient Hospital Stay (HOSPITAL_COMMUNITY)
Admission: EM | Admit: 2024-06-17 | Discharge: 2024-06-27 | DRG: 871 | Disposition: A | Discharge status: Home / Self Care | Attending: Family Medicine | Admitting: Family Medicine

## 2024-06-17 DIAGNOSIS — R0682 Tachypnea, not elsewhere classified: Secondary | ICD-10-CM | POA: Diagnosis not present

## 2024-06-17 DIAGNOSIS — N179 Acute kidney failure, unspecified: Secondary | ICD-10-CM | POA: Diagnosis present

## 2024-06-17 DIAGNOSIS — E782 Mixed hyperlipidemia: Secondary | ICD-10-CM | POA: Diagnosis present

## 2024-06-17 DIAGNOSIS — Z951 Presence of aortocoronary bypass graft: Secondary | ICD-10-CM

## 2024-06-17 DIAGNOSIS — R7989 Other specified abnormal findings of blood chemistry: Secondary | ICD-10-CM | POA: Diagnosis not present

## 2024-06-17 DIAGNOSIS — I4819 Other persistent atrial fibrillation: Secondary | ICD-10-CM | POA: Diagnosis present

## 2024-06-17 DIAGNOSIS — Z6834 Body mass index (BMI) 34.0-34.9, adult: Secondary | ICD-10-CM | POA: Diagnosis not present

## 2024-06-17 DIAGNOSIS — Z87442 Personal history of urinary calculi: Secondary | ICD-10-CM

## 2024-06-17 DIAGNOSIS — Z860100 Personal history of colon polyps, unspecified: Secondary | ICD-10-CM

## 2024-06-17 DIAGNOSIS — R404 Transient alteration of awareness: Secondary | ICD-10-CM | POA: Diagnosis not present

## 2024-06-17 DIAGNOSIS — I330 Acute and subacute infective endocarditis: Secondary | ICD-10-CM | POA: Diagnosis present

## 2024-06-17 DIAGNOSIS — Z7901 Long term (current) use of anticoagulants: Secondary | ICD-10-CM | POA: Diagnosis not present

## 2024-06-17 DIAGNOSIS — I252 Old myocardial infarction: Secondary | ICD-10-CM

## 2024-06-17 DIAGNOSIS — R7881 Bacteremia: Secondary | ICD-10-CM | POA: Diagnosis not present

## 2024-06-17 DIAGNOSIS — I3489 Other nonrheumatic mitral valve disorders: Secondary | ICD-10-CM | POA: Diagnosis not present

## 2024-06-17 DIAGNOSIS — E871 Hypo-osmolality and hyponatremia: Secondary | ICD-10-CM | POA: Diagnosis present

## 2024-06-17 DIAGNOSIS — T679XXA Effect of heat and light, unspecified, initial encounter: Secondary | ICD-10-CM

## 2024-06-17 DIAGNOSIS — Z83438 Family history of other disorder of lipoprotein metabolism and other lipidemia: Secondary | ICD-10-CM

## 2024-06-17 DIAGNOSIS — I38 Endocarditis, valve unspecified: Secondary | ICD-10-CM | POA: Diagnosis not present

## 2024-06-17 DIAGNOSIS — I21A1 Myocardial infarction type 2: Secondary | ICD-10-CM | POA: Diagnosis present

## 2024-06-17 DIAGNOSIS — R4182 Altered mental status, unspecified: Secondary | ICD-10-CM | POA: Diagnosis not present

## 2024-06-17 DIAGNOSIS — Z79899 Other long term (current) drug therapy: Secondary | ICD-10-CM | POA: Diagnosis not present

## 2024-06-17 DIAGNOSIS — R29700 NIHSS score 0: Secondary | ICD-10-CM | POA: Diagnosis not present

## 2024-06-17 DIAGNOSIS — Z85820 Personal history of malignant melanoma of skin: Secondary | ICD-10-CM | POA: Diagnosis not present

## 2024-06-17 DIAGNOSIS — Z87891 Personal history of nicotine dependence: Secondary | ICD-10-CM | POA: Diagnosis not present

## 2024-06-17 DIAGNOSIS — E876 Hypokalemia: Secondary | ICD-10-CM | POA: Diagnosis present

## 2024-06-17 DIAGNOSIS — Z8249 Family history of ischemic heart disease and other diseases of the circulatory system: Secondary | ICD-10-CM | POA: Diagnosis not present

## 2024-06-17 DIAGNOSIS — L7631 Postprocedural hematoma of skin and subcutaneous tissue following a dermatologic procedure: Secondary | ICD-10-CM | POA: Diagnosis present

## 2024-06-17 DIAGNOSIS — Y838 Other surgical procedures as the cause of abnormal reaction of the patient, or of later complication, without mention of misadventure at the time of the procedure: Secondary | ICD-10-CM | POA: Diagnosis present

## 2024-06-17 DIAGNOSIS — Z85828 Personal history of other malignant neoplasm of skin: Secondary | ICD-10-CM

## 2024-06-17 DIAGNOSIS — R6520 Severe sepsis without septic shock: Secondary | ICD-10-CM | POA: Diagnosis present

## 2024-06-17 DIAGNOSIS — I6523 Occlusion and stenosis of bilateral carotid arteries: Secondary | ICD-10-CM | POA: Diagnosis not present

## 2024-06-17 DIAGNOSIS — D72829 Elevated white blood cell count, unspecified: Secondary | ICD-10-CM | POA: Diagnosis not present

## 2024-06-17 DIAGNOSIS — N183 Chronic kidney disease, stage 3 unspecified: Secondary | ICD-10-CM | POA: Diagnosis not present

## 2024-06-17 DIAGNOSIS — R531 Weakness: Secondary | ICD-10-CM

## 2024-06-17 DIAGNOSIS — B9561 Methicillin susceptible Staphylococcus aureus infection as the cause of diseases classified elsewhere: Secondary | ICD-10-CM | POA: Diagnosis not present

## 2024-06-17 DIAGNOSIS — A4101 Sepsis due to Methicillin susceptible Staphylococcus aureus: Secondary | ICD-10-CM | POA: Diagnosis present

## 2024-06-17 DIAGNOSIS — I10 Essential (primary) hypertension: Secondary | ICD-10-CM | POA: Diagnosis present

## 2024-06-17 DIAGNOSIS — K219 Gastro-esophageal reflux disease without esophagitis: Secondary | ICD-10-CM | POA: Diagnosis present

## 2024-06-17 DIAGNOSIS — Z808 Family history of malignant neoplasm of other organs or systems: Secondary | ICD-10-CM

## 2024-06-17 DIAGNOSIS — I4891 Unspecified atrial fibrillation: Secondary | ICD-10-CM | POA: Diagnosis present

## 2024-06-17 DIAGNOSIS — T8131XA Disruption of external operation (surgical) wound, not elsewhere classified, initial encounter: Secondary | ICD-10-CM | POA: Diagnosis not present

## 2024-06-17 DIAGNOSIS — D6959 Other secondary thrombocytopenia: Secondary | ICD-10-CM | POA: Diagnosis not present

## 2024-06-17 DIAGNOSIS — I340 Nonrheumatic mitral (valve) insufficiency: Secondary | ICD-10-CM | POA: Diagnosis not present

## 2024-06-17 DIAGNOSIS — I4892 Unspecified atrial flutter: Secondary | ICD-10-CM | POA: Diagnosis present

## 2024-06-17 DIAGNOSIS — E66811 Obesity, class 1: Secondary | ICD-10-CM | POA: Diagnosis present

## 2024-06-17 DIAGNOSIS — I714 Abdominal aortic aneurysm, without rupture, unspecified: Secondary | ICD-10-CM | POA: Diagnosis not present

## 2024-06-17 DIAGNOSIS — G9341 Metabolic encephalopathy: Secondary | ICD-10-CM | POA: Diagnosis present

## 2024-06-17 DIAGNOSIS — R339 Retention of urine, unspecified: Secondary | ICD-10-CM | POA: Diagnosis present

## 2024-06-17 DIAGNOSIS — A419 Sepsis, unspecified organism: Secondary | ICD-10-CM | POA: Diagnosis not present

## 2024-06-17 DIAGNOSIS — I6340 Cerebral infarction due to embolism of unspecified cerebral artery: Secondary | ICD-10-CM | POA: Diagnosis not present

## 2024-06-17 DIAGNOSIS — I480 Paroxysmal atrial fibrillation: Secondary | ICD-10-CM | POA: Diagnosis not present

## 2024-06-17 DIAGNOSIS — I129 Hypertensive chronic kidney disease with stage 1 through stage 4 chronic kidney disease, or unspecified chronic kidney disease: Secondary | ICD-10-CM | POA: Diagnosis present

## 2024-06-17 DIAGNOSIS — R000 Tachycardia, unspecified: Secondary | ICD-10-CM | POA: Diagnosis not present

## 2024-06-17 DIAGNOSIS — N1831 Chronic kidney disease, stage 3a: Secondary | ICD-10-CM | POA: Diagnosis present

## 2024-06-17 DIAGNOSIS — G4733 Obstructive sleep apnea (adult) (pediatric): Secondary | ICD-10-CM | POA: Diagnosis present

## 2024-06-17 DIAGNOSIS — I2510 Atherosclerotic heart disease of native coronary artery without angina pectoris: Secondary | ICD-10-CM | POA: Diagnosis present

## 2024-06-17 DIAGNOSIS — M799 Soft tissue disorder, unspecified: Secondary | ICD-10-CM | POA: Diagnosis not present

## 2024-06-17 DIAGNOSIS — R29705 NIHSS score 5: Secondary | ICD-10-CM | POA: Diagnosis not present

## 2024-06-17 DIAGNOSIS — Z800 Family history of malignant neoplasm of digestive organs: Secondary | ICD-10-CM

## 2024-06-17 DIAGNOSIS — R509 Fever, unspecified: Secondary | ICD-10-CM | POA: Diagnosis present

## 2024-06-17 DIAGNOSIS — I76 Septic arterial embolism: Secondary | ICD-10-CM | POA: Diagnosis present

## 2024-06-17 DIAGNOSIS — R0902 Hypoxemia: Secondary | ICD-10-CM | POA: Diagnosis present

## 2024-06-17 DIAGNOSIS — Z8619 Personal history of other infectious and parasitic diseases: Secondary | ICD-10-CM

## 2024-06-17 DIAGNOSIS — R61 Generalized hyperhidrosis: Secondary | ICD-10-CM | POA: Diagnosis not present

## 2024-06-17 DIAGNOSIS — Z8679 Personal history of other diseases of the circulatory system: Secondary | ICD-10-CM

## 2024-06-17 DIAGNOSIS — E039 Hypothyroidism, unspecified: Secondary | ICD-10-CM | POA: Diagnosis present

## 2024-06-17 DIAGNOSIS — I672 Cerebral atherosclerosis: Secondary | ICD-10-CM | POA: Diagnosis present

## 2024-06-17 DIAGNOSIS — T86822 Skin graft (allograft) (autograft) infection: Secondary | ICD-10-CM | POA: Diagnosis not present

## 2024-06-17 DIAGNOSIS — T8149XA Infection following a procedure, other surgical site, initial encounter: Secondary | ICD-10-CM | POA: Insufficient documentation

## 2024-06-17 DIAGNOSIS — I63443 Cerebral infarction due to embolism of bilateral cerebellar arteries: Secondary | ICD-10-CM | POA: Diagnosis not present

## 2024-06-17 DIAGNOSIS — I059 Rheumatic mitral valve disease, unspecified: Secondary | ICD-10-CM | POA: Diagnosis not present

## 2024-06-17 DIAGNOSIS — R739 Hyperglycemia, unspecified: Secondary | ICD-10-CM | POA: Diagnosis present

## 2024-06-17 DIAGNOSIS — I493 Ventricular premature depolarization: Secondary | ICD-10-CM | POA: Diagnosis not present

## 2024-06-17 DIAGNOSIS — R410 Disorientation, unspecified: Secondary | ICD-10-CM | POA: Diagnosis not present

## 2024-06-17 DIAGNOSIS — Z8042 Family history of malignant neoplasm of prostate: Secondary | ICD-10-CM

## 2024-06-17 LAB — COMPREHENSIVE METABOLIC PANEL WITH GFR
ALT: 25 U/L (ref 0–44)
AST: 33 U/L (ref 15–41)
Albumin: 3.3 g/dL — ABNORMAL LOW (ref 3.5–5.0)
Alkaline Phosphatase: 42 U/L (ref 38–126)
Anion gap: 13 (ref 5–15)
BUN: 39 mg/dL — ABNORMAL HIGH (ref 8–23)
CO2: 23 mmol/L (ref 22–32)
Calcium: 8.3 mg/dL — ABNORMAL LOW (ref 8.9–10.3)
Chloride: 94 mmol/L — ABNORMAL LOW (ref 98–111)
Creatinine, Ser: 1.87 mg/dL — ABNORMAL HIGH (ref 0.61–1.24)
GFR, Estimated: 35 mL/min — ABNORMAL LOW (ref >60)
Glucose, Bld: 139 mg/dL — ABNORMAL HIGH (ref 70–99)
Potassium: 2.7 mmol/L — CL (ref 3.5–5.1)
Sodium: 130 mmol/L — ABNORMAL LOW (ref 135–145)
Total Bilirubin: 1.1 mg/dL (ref 0.0–1.2)
Total Protein: 6.1 g/dL — ABNORMAL LOW (ref 6.5–8.1)

## 2024-06-17 LAB — CBC WITH DIFFERENTIAL/PLATELET
Abs Immature Granulocytes: 0.28 K/uL — ABNORMAL HIGH (ref 0.00–0.07)
Basophils Absolute: 0 K/uL (ref 0.0–0.1)
Basophils Relative: 0 %
Eosinophils Absolute: 0 K/uL (ref 0.0–0.5)
Eosinophils Relative: 0 %
HCT: 37.9 % — ABNORMAL LOW (ref 39.0–52.0)
Hemoglobin: 13.6 g/dL (ref 13.0–17.0)
Immature Granulocytes: 3 %
Lymphocytes Relative: 6 %
Lymphs Abs: 0.6 K/uL — ABNORMAL LOW (ref 0.7–4.0)
MCH: 32.2 pg (ref 26.0–34.0)
MCHC: 35.9 g/dL (ref 30.0–36.0)
MCV: 89.6 fL (ref 80.0–100.0)
Monocytes Absolute: 0.8 K/uL (ref 0.1–1.0)
Monocytes Relative: 8 %
Neutro Abs: 8.1 K/uL — ABNORMAL HIGH (ref 1.7–7.7)
Neutrophils Relative %: 83 %
Platelets: 75 K/uL — ABNORMAL LOW (ref 150–400)
RBC: 4.23 MIL/uL (ref 4.22–5.81)
RDW: 13.5 % (ref 11.5–15.5)
WBC: 9.8 K/uL (ref 4.0–10.5)
nRBC: 0 % (ref 0.0–0.2)

## 2024-06-17 LAB — MAGNESIUM: Magnesium: 1.9 mg/dL (ref 1.7–2.4)

## 2024-06-17 LAB — SEDIMENTATION RATE: Sed Rate: 22 mm/h — ABNORMAL HIGH (ref 0–16)

## 2024-06-17 LAB — PROTIME-INR
INR: 1.5 — ABNORMAL HIGH (ref 0.8–1.2)
Prothrombin Time: 18.4 s — ABNORMAL HIGH (ref 11.4–15.2)

## 2024-06-17 LAB — I-STAT CG4 LACTIC ACID, ED: Lactic Acid, Venous: 1.7 mmol/L (ref 0.5–1.9)

## 2024-06-17 MED ORDER — SODIUM CHLORIDE 0.9 % IV SOLN
250.0000 mL | INTRAVENOUS | Status: AC | PRN
Start: 2024-06-17 — End: 2024-06-18

## 2024-06-17 MED ORDER — IOHEXOL 350 MG/ML SOLN
50.0000 mL | Freq: Once | INTRAVENOUS | Status: AC | PRN
  Administered 2024-06-17: 50 mL via INTRAVENOUS

## 2024-06-17 MED ORDER — POTASSIUM CHLORIDE 10 MEQ/100ML IV SOLN
10.0000 meq | INTRAVENOUS | Status: AC
  Administered 2024-06-17: 10 meq via INTRAVENOUS
  Filled 2024-06-17 (×2): qty 100

## 2024-06-17 MED ORDER — VANCOMYCIN VARIABLE DOSE PER UNSTABLE RENAL FUNCTION (PHARMACIST DOSING): Status: DC

## 2024-06-17 MED ORDER — AMLODIPINE BESYLATE 10 MG PO TABS
10.0000 mg | ORAL_TABLET | Freq: Every day | ORAL | Status: DC
Start: 2024-06-17 — End: 2024-06-18
  Administered 2024-06-17: 10 mg via ORAL
  Filled 2024-06-17: qty 1

## 2024-06-17 MED ORDER — POLYETHYLENE GLYCOL 3350 17 G PO PACK: 17.0000 g | PACK | Freq: Every day | ORAL | Status: DC | PRN

## 2024-06-17 MED ORDER — ONDANSETRON HCL 4 MG/2ML IJ SOLN: 4.0000 mg | Freq: Four times a day (QID) | INTRAMUSCULAR | Status: DC | PRN

## 2024-06-17 MED ORDER — ATORVASTATIN CALCIUM 40 MG PO TABS
40.0000 mg | ORAL_TABLET | Freq: Every day | ORAL | Status: DC
  Administered 2024-06-18 – 2024-06-27 (×9): 40 mg via ORAL
  Filled 2024-06-17 (×10): qty 1

## 2024-06-17 MED ORDER — HYDRALAZINE HCL 20 MG/ML IJ SOLN: 5.0000 mg | Freq: Four times a day (QID) | INTRAMUSCULAR | Status: DC | PRN

## 2024-06-17 MED ORDER — ACETAMINOPHEN 325 MG PO TABS
650.0000 mg | ORAL_TABLET | Freq: Four times a day (QID) | ORAL | Status: DC | PRN
  Administered 2024-06-17 – 2024-06-19 (×2): 650 mg via ORAL
  Filled 2024-06-17 (×2): qty 2

## 2024-06-17 MED ORDER — ACETAMINOPHEN 650 MG RE SUPP: 650.0000 mg | Freq: Four times a day (QID) | RECTAL | Status: DC | PRN

## 2024-06-17 MED ORDER — SODIUM CHLORIDE 0.9% FLUSH: 3.0000 mL | INTRAVENOUS | Status: DC | PRN

## 2024-06-17 MED ORDER — APIXABAN 2.5 MG PO TABS
2.5000 mg | ORAL_TABLET | Freq: Two times a day (BID) | ORAL | Status: DC
Start: 2024-06-17 — End: 2024-06-18
  Administered 2024-06-17: 2.5 mg via ORAL
  Filled 2024-06-17: qty 1

## 2024-06-17 MED ORDER — PIPERACILLIN-TAZOBACTAM 3.375 G IVPB
3.3750 g | Freq: Three times a day (TID) | INTRAVENOUS | Status: DC
  Administered 2024-06-18: 3.375 g via INTRAVENOUS
  Filled 2024-06-17: qty 50

## 2024-06-17 MED ORDER — SODIUM CHLORIDE 0.9 % IV BOLUS
1000.0000 mL | Freq: Once | INTRAVENOUS | Status: AC
  Administered 2024-06-17: 1000 mL via INTRAVENOUS

## 2024-06-17 MED ORDER — ADULT MULTIVITAMIN W/MINERALS CH
1.0000 | ORAL_TABLET | Freq: Every day | ORAL | Status: DC
  Administered 2024-06-17 – 2024-06-27 (×10): 1 via ORAL
  Filled 2024-06-17 (×12): qty 1

## 2024-06-17 MED ORDER — VANCOMYCIN HCL 2000 MG/400ML IV SOLN
2000.0000 mg | Freq: Once | INTRAVENOUS | Status: AC
  Administered 2024-06-17: 2000 mg via INTRAVENOUS
  Filled 2024-06-17: qty 400

## 2024-06-17 MED ORDER — APIXABAN 5 MG PO TABS: 5.0000 mg | ORAL_TABLET | Freq: Two times a day (BID) | ORAL | Status: DC

## 2024-06-17 MED ORDER — SODIUM CHLORIDE 0.9% FLUSH
3.0000 mL | Freq: Two times a day (BID) | INTRAVENOUS | Status: DC
  Administered 2024-06-17 – 2024-06-27 (×19): 3 mL via INTRAVENOUS

## 2024-06-17 MED ORDER — POTASSIUM CHLORIDE 10 MEQ/100ML IV SOLN
10.0000 meq | INTRAVENOUS | Status: AC
  Administered 2024-06-17 – 2024-06-18 (×4): 10 meq via INTRAVENOUS
  Filled 2024-06-17 (×3): qty 100

## 2024-06-17 MED ORDER — HYDROCODONE-ACETAMINOPHEN 5-325 MG PO TABS
1.0000 | ORAL_TABLET | ORAL | Status: DC | PRN
  Administered 2024-06-24: 2 via ORAL
  Filled 2024-06-17: qty 2

## 2024-06-17 MED ORDER — POLYETHYLENE GLYCOL 3350 17 G PO PACK
17.0000 g | PACK | Freq: Every day | ORAL | Status: DC
  Administered 2024-06-17 – 2024-06-27 (×9): 17 g via ORAL
  Filled 2024-06-17 (×10): qty 1

## 2024-06-17 MED ORDER — LACTATED RINGERS IV BOLUS (SEPSIS)
1000.0000 mL | Freq: Once | INTRAVENOUS | Status: AC
  Administered 2024-06-17: 1000 mL via INTRAVENOUS

## 2024-06-17 MED ORDER — ACETAMINOPHEN 325 MG PO TABS
650.0000 mg | ORAL_TABLET | Freq: Once | ORAL | Status: AC
  Administered 2024-06-17: 650 mg via ORAL
  Filled 2024-06-17: qty 2

## 2024-06-17 MED ORDER — PIPERACILLIN-TAZOBACTAM 3.375 G IVPB 30 MIN
3.3750 g | Freq: Once | INTRAVENOUS | Status: AC
  Administered 2024-06-17: 3.375 g via INTRAVENOUS
  Filled 2024-06-17: qty 50

## 2024-06-17 MED ORDER — SODIUM CHLORIDE 0.9 % IV SOLN
INTRAVENOUS | Status: AC
  Administered 2024-06-18: 100 mL via INTRAVENOUS

## 2024-06-17 MED ORDER — ONDANSETRON HCL 4 MG PO TABS: 4.0000 mg | ORAL_TABLET | Freq: Four times a day (QID) | ORAL | Status: DC | PRN

## 2024-06-17 MED ORDER — VANCOMYCIN HCL 1500 MG/300ML IV SOLN: 1500.0000 mg | INTRAVENOUS | Status: DC

## 2024-06-17 NOTE — Progress Notes (Addendum)
 Pharmacy Antibiotic Note  Philip [name] is a 83 y.o. male admitted on 06/17/2024 with wound infection.  Pharmacy has been consulted for Vancomycin  and Zosyn  dosing.  Plan: Vancomycin  per level considering patient is in AKI Continue to monitor renal function Vancomycin  peak and trough to be ordered if deemed appropriate PTZ 3.375 q8h Continue to monitor Bcx   Height: 5' 9 (175.3 cm) Weight: 101.2 kg (223 lb) IBW/kg (Calculated) : 70.7  Temp (24hrs), Avg:102.6 F (39.2 C), Min:101.9 F (38.8 C), Max:103 F (39.4 C)  Recent Labs  Lab 06/17/24 1734 06/17/24 1735 06/17/24 1903  WBC 9.8  --   --   CREATININE  --   --  1.87*  LATICACIDVEN  --  1.7  --     Estimated Creatinine Clearance: 34.5 mL/min (A) (by C-G formula based on SCr of 1.87 mg/dL (H)).    No Known Allergies  Antimicrobials this admission: Vancomycin  2 g x1 VAN 1.5 g q36  >>  PTZ 3.375 q8  >>   Microbiology results: 08/18 BCx: in process 08/18 UCx: needs to be collected   Thank you for allowing pharmacy to be a part of this patient's care.  R. Samual Satterfield, PharmD PGY-1 Acute Care Pharmacy Resident [facility] 06/17/2024 10:09 PM

## 2024-06-17 NOTE — Progress Notes (Signed)
 ED Pharmacy Antibiotic Sign Off An antibiotic consult was received from an ED provider for vancomycin  and zosyn  per pharmacy dosing for sepsis. A chart review was completed to assess appropriateness.  The following one time order(s) were placed per pharmacy consult:  vancomycin   2000 mg IV  x 1 dose zosyn   3.375 mg IV x 1 dose  Further antibiotic and/or antibiotic pharmacy consults should be ordered by the admitting provider if indicated.   Thank you for allowing pharmacy to be a part of this patient's care.   R. Samual Satterfield, PharmD PGY-1 Acute Care Pharmacy Resident [facility] 06/17/2024 8:42 PM

## 2024-06-17 NOTE — ED Triage Notes (Signed)
 Pt arrives to ED via EMS from a parking lot near an urgent care where he was found roaming around the parking lot disoriented and attempting to find urgent care. Pt is a/o x2 Pt found to have a 103 tempeture with EMS upon arrival.

## 2024-06-17 NOTE — Hospital Course (Addendum)
 83 y.o. 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

## 2024-06-17 NOTE — ED Provider Notes (Signed)
 [place] EMERGENCY DEPARTMENT AT [facility] Provider Note   CSN: [id number] Arrival date & time: 06/17/24  1706     Patient presents with: Code Sepsis   Philip [name] is a 83 y.o. male found by doctor's office staff wandering repeatedly in the parking lot.  Patient was in his car, out in the heat all day.  Very confused for EMS.  They report BS 140's, BP 150's systolic, tachycardic HR 130's, with hypoxia requiring 4L [place].  Pt cannot provide any clear answers about today, stuttering and trailing off when speaking.  He states he lives with his wife.    His wife Suellen by phone reports the patient had a skin graft and surgery on his ear on Friday, 3 days ago, but had to go back 2 days ago due to bleeding issues for stitches and cautizeration.  He's on doxycycline.  EMS reports temp 101F for their oral thermometer  My review of medical chart notes a history of parox A Fib, OSA, on A/C with eliquis , AAA stenting by Dr Eliza in 2015.  He was seen by cardiology 1 week ago Dr Court in the office who does not note confusion in the chart.   HPI     Prior to Admission medications   Medication Sig Start Date End Date Taking? Authorizing Provider  amLODipine  (NORVASC ) 10 MG tablet Take 1 tablet (10 mg total) by mouth at bedtime. 06/10/24   Court Dorn PARAS, MD  Ascorbic Acid (VITAMIN C ER PO) Take 500 mg by mouth daily.    [provider]  atorvastatin  (LIPITOR) 40 MG tablet Take 1 tablet (40 mg total) by mouth daily. 06/10/24   Court Dorn PARAS, MD  B Complex-Biotin-FA (B-COMPLEX PO) Take 1 tablet by mouth daily.    [provider]  co-enzyme Q-10 30 MG capsule Take 30 mg by mouth daily.    [provider]  ELIQUIS  5 MG TABS tablet TAKE 1 TABLET TWICE DAILY 02/07/24   Dick, Ernest H Jr., NP  fluorouracil (EFUDEX) 5 % cream Apply topically 2 (two) times daily. 06/09/23   [provider]  hydrochlorothiazide  (HYDRODIURIL ) 25 MG tablet Take 1 tablet  (25 mg total) by mouth daily. 07/17/23   [name], Jose E, MD  hydrocortisone 2.5 % cream Apply 1 application topically as needed (skin irritation). Patient not taking: Reported on 06/10/2024 11/14/16   [provider]  losartan  (COZAAR ) 100 MG tablet Take 1 tablet (100 mg total) by mouth daily. 01/08/24   [name], Jose E, MD  magnesium  oxide (MAG-OX) 400 MG tablet Take 800 mg by mouth daily.    [provider]  Multiple Vitamins-Minerals (MULTIVITAMIN WITH MINERALS) tablet Take 1 tablet by mouth daily.    [provider]  Omega-3 Fatty Acids (OMEGA-3 FISH OIL ) 1200 MG CAPS Take 2 capsules (2,400 mg total) by mouth daily. Take 2 1,200 mg capsules daily 11/11/21   Eckard, Tammy, RPH-CPP  polyethylene glycol (MIRALAX  / GLYCOLAX ) packet Take 17 g by mouth daily.    [provider]    Allergies: Patient has no known allergies.    Review of Systems  Updated Vital Signs BP 107/63   Pulse (!) 108   Temp (!) 101.9 F (38.8 C) (Oral)   Resp (!) 22   Ht 5' 9 (1.753 m)   Wt 101.2 kg   SpO2 94%   BMI 32.93 kg/m   Physical Exam Constitutional:      General: He is not in  acute distress. HENT:     Head: Normocephalic and atraumatic.  Eyes:     Conjunctiva/sclera: Conjunctivae normal.     Pupils: Pupils are equal, round, and reactive to light.  Cardiovascular:     Rate and Rhythm: Tachycardia present. Rhythm irregular.  Pulmonary:     Effort: Pulmonary effort is normal. No respiratory distress.     Comments: 96% on 4L [place] Abdominal:     General: There is no distension.     Tenderness: There is no abdominal tenderness.  Skin:    General: Skin is warm and dry.  Neurological:     General: No focal deficit present.     Mental Status: He is alert. Mental status is at baseline.     (all labs ordered are listed, but only abnormal results are displayed) Labs Reviewed  CBC WITH DIFFERENTIAL/PLATELET - Abnormal; Notable for the following components:      Result Value    HCT 37.9 (*)    Platelets 75 (*)    Neutro Abs 8.1 (*)    Lymphs Abs 0.6 (*)    Abs Immature Granulocytes 0.28 (*)    All other components within normal limits  PROTIME-INR - Abnormal; Notable for the following components:   Prothrombin Time 18.4 (*)    INR 1.5 (*)    All other components within normal limits  COMPREHENSIVE METABOLIC PANEL WITH GFR - Abnormal; Notable for the following components:   Sodium 130 (*)    Potassium 2.7 (*)    Chloride 94 (*)    Glucose, Bld 139 (*)    BUN 39 (*)    Creatinine, Ser 1.87 (*)    Calcium  8.3 (*)    Total Protein 6.1 (*)    Albumin 3.3 (*)    GFR, Estimated 35 (*)    All other components within normal limits  CULTURE, BLOOD (ROUTINE X 2)  CULTURE, BLOOD (ROUTINE X 2)  URINALYSIS, W/ REFLEX TO CULTURE (INFECTION SUSPECTED)  MAGNESIUM   I-STAT CG4 LACTIC ACID, ED    EKG: None  Radiology: CT Head Wo Contrast Result Date: 06/17/2024 CLINICAL DATA:  Altered level of consciousness EXAM: CT HEAD WITHOUT CONTRAST TECHNIQUE: Contiguous axial images were obtained from the base of the skull through the vertex without intravenous contrast. RADIATION DOSE REDUCTION: This exam was performed according to the departmental dose-optimization program which includes automated exposure control, adjustment of the mA and/or kV according to patient size and/or use of iterative reconstruction technique. COMPARISON:  10/06/2021 FINDINGS: Brain: No acute infarct or hemorrhage. Lateral ventricles and midline structures are unremarkable. No acute extra-axial fluid collections. No mass effect. Vascular: No hyperdense vessel or unexpected calcification. Skull: Normal. Negative for fracture or focal lesion. Sinuses/Orbits: No acute finding. Other: None. IMPRESSION: 1. No acute intracranial process. Electronically Signed   By: Ozell Daring M.D.   On: 06/17/2024 18:24   DG Chest Port 1 View Result Date: 06/17/2024 CLINICAL DATA:  Sepsis EXAM: PORTABLE CHEST 1 VIEW  COMPARISON:  10/06/2021 FINDINGS: 2 frontal views of the chest demonstrate stable enlargement the cardiac silhouette and postsurgical changes from CABG. No acute airspace disease, effusion, or pneumothorax. There are no acute bony abnormalities. IMPRESSION: 1. Stable chest, no acute process. Electronically Signed   By: Ozell Daring M.D.   On: 06/17/2024 17:38     .Critical Care  Performed by: Cottie Donnice PARAS, MD Authorized by: Cottie Donnice PARAS, MD   Critical care provider statement:    Critical care time (minutes):  40  Critical care time was exclusive of:  Separately billable procedures and treating other patients   Critical care was time spent personally by me on the following activities:  Ordering and performing treatments and interventions, ordering and review of laboratory studies, ordering and review of radiographic studies, pulse oximetry, review of old charts, examination of patient and evaluation of patient's response to treatment Comments:     Sepsis workup, hypokalemia management    Medications Ordered in the ED  potassium chloride  10 mEq in 100 mL IVPB (10 mEq Intravenous New Bag/Given 06/17/24 2036)  vancomycin  (VANCOREADY) IVPB 2000 mg/400 mL (has no administration in time range)  piperacillin -tazobactam (ZOSYN ) IVPB 3.375 g (has no administration in time range)  lactated ringers  bolus 1,000 mL (0 mLs Intravenous Stopped 06/17/24 1906)  acetaminophen  (TYLENOL ) tablet 650 mg (650 mg Oral Given 06/17/24 1738)  sodium chloride  0.9 % bolus 1,000 mL (1,000 mLs Intravenous New Bag/Given 06/17/24 2035)    Clinical Course as of 06/17/24 2108  Mon Jun 17, 2024  1721 His wife was contacted by phone and is en route to the hospital [MT]  1828 Lactate and WCB wnl. [MT]  2038 BP stable - mental status improving back to baseline according to family at bedside.  Pt still weak and difficulty standing  Remains in A Fib HR 100's.  Plan to admit to hospital for hypokalemia, sepsis rule out  [MT]  2102 Admitted to hospitalist [MT]    Clinical Course User Index [MT] Zyan Mirkin, Donnice PARAS, MD                                 Medical Decision Making Amount and/or Complexity of Data Reviewed Labs: ordered. Radiology: ordered.  Risk OTC drugs. Prescription drug management. Decision regarding hospitalization.   This patient presents to the Emergency Department with complaint of altered mental status.  This involves an extensive number of treatment options, and is a complaint that carries with it a high risk of complications and morbidity.  The differential diagnosis includes hypoglycemia vs metabolic encephalopathy vs infection (including cystitis) vs ICH vs stroke vs polypharmacy vs other  I ordered, reviewed, and interpreted labs, including lactate, wbc wnl.  K 2.7. I ordered medication IV fluids, BS antibiotics per sepsis protocol I ordered imaging studies which included ct head, dg chest  I independently visualized and interpreted imaging which showed no emergent findings and the monitor tracing which showed A Fib borderline RVR Additional history was obtained from wife, family I personally reviewed the patients ECG which showed A fib with RVR  After the interventions stated above, I reevaluated the patient and found mental status improved, BP stable, HR improving.  Plan to admit for sepsis rule out and hypokalemia.  UA pending on admission.  He feels better but still weak.      Final diagnoses:  Hypokalemia  Sepsis, due to unspecified organism, unspecified whether acute organ dysfunction present (HCC)  Weakness  Heat exposure, initial encounter    ED Discharge Orders     None          Cottie Donnice PARAS, MD 06/17/24 2108

## 2024-06-17 NOTE — ED Triage Notes (Addendum)
 Philip [name]

## 2024-06-17 NOTE — H&P (Signed)
 [facility] History and Physical  [name] FMW:[id number] DOB: [birth year] DOA: 06/17/2024  Referring physician: ED  PCP: Philip Aloysius BRAVO, MD   Patient is coming from: Home  Chief Complaint: Fever confusion  HPI:  Philip [name] is a 83 years old male with past medical history of atrial fibrillation, CAD status post CABG, PAD, obstructive sleep apnea, history of AAA stenting presented to hospital after being found wandering in the parking lot out in the heat trying to get to his car.  Patient is stated that he had a follow-up with dermatologist today and when he was about to go to his car he was found to be wandering and confused.  Bystanders called  EMS.  EMS found that that he was tachycardic and hypoxic requiring 4 L of nasal cannula oxygen and was febrile with a temperature of 101 F.   Of note patient had recent skin grafting surgery on his ear 3 days ago and was taking doxycycline. In the ED. in the ED patient was febrile with a temperature of 103 F, tachycardia at 128 with blood pressure of 149/81.  Labs were notable for no leukocytosis.  Lactic acid of 1.7.  BMP showed potassium low at 2.7 with elevated creatinine at 1.8 and low sodium at 130.  Chest x-ray did not show any infiltrate.  CT head scan was negative for acute process.  Blood cultures were sent from the ED and patient was considered for admission to hospital for further evaluation and treatment.   Assessment and Plan Principal Problem:   Fever Active Problems:   Mixed hyperlipidemia   Essential hypertension   History of skin cancer   Atrial fibrillation (HCC)   Altered mental status  Fever, altered mental status likely metabolic encephalopathy Rule out sepsis.  Unable to rule out heat exhaustion/stroke.  Patient was tachycardic tachypneic and febrile without leukocytosis or lactic acid elevation.  Blood cultures have been sent.  Has been started on antibiotic empirically with Zosyn  and vancomycin  since patient has  a recent skin surgery  Urinalysis pending..  Negative CT head.  Will continue to monitor closely.  Will need to follow cultures and see clinical course.  De-escalate antibiotics as clinically indicated.  Will get the CT scan of the soft tissue of the neck since there is area of tenderness with induration in the neck.  Looks like patient had graft was taken from the neck and placed on the earlobe.  Recent ear surgery likely Mohs surgery with graft placement from the neck..  Has area of induration in the neck possibly hematoma.  Tenderness on palpation.  In the context of fever we will get a CT scan of the soft tissue of the neck.  Unable to use IV contrast.  Will continue with vancomycin  and Zosyn .    Significant hypokalemia Will replace.  Check levels in AM.  Hyponatremia Likely hypovolemic hyponatremia.  Sodium 130 on presentation.  Continue with normal saline hydration.  AKI.  Baseline creatinine around 1.3.  Creatinine on presentation at 1.8.  Continue IV hydration.  Check BMP in AM.  Persistent atrial fibrillation. Tachycardic on presentation likely secondary to fever.  Still tachycardic.  On Eliquis  will resume.  Essential hypertension Resume amlodipine .  Hold HCTZ and losartan  for now.  Add as needed hydralazine  if needed  Class I obesity.Body mass index is 32.93 kg/m.  Would benefit from ongoing weight loss as outpatient.   DVT Prophylaxis: Eliquis   Review of Systems:  All systems were reviewed and were  negative unless otherwise mentioned in the HPI   Past Medical History:  Diagnosis Date   AAA (abdominal aortic aneurysm) (HCC)    Arthritis    Atrial fibrillation (HCC)    CAD (coronary artery disease)    had a MI , s/p CABG   Cataracts, bilateral    immature   CHF (congestive heart failure) (HCC)    Dysrhythmia    paroximal a fib   GERD (gastroesophageal reflux disease)    takes Omeprazole  daily   History of colon polyps    History of kidney stones    History of  shingles    Hyperlipidemia    Hypertension    takes Amlodipine ,Metoprolol ,and Lisinopril  daily   Joint pain    Joint swelling    Myocardial infarction (HCC)    several with last one being 2001(but never knew about any except 2001)   OSA (obstructive sleep apnea)    started CPAP 12/09   Peripheral vascular disease (HCC)    Peyronie's disease    s/p penile implant in 2006   Pneumonia    as a child   Skin cancer    several , one of them was melanoma (sees derm routinely)   Sleep apnea    Tingling    feet occasionally   Tinnitus    Past Surgical History:  Procedure Laterality Date   ABDOMINAL AORTAGRAM N/A 06/16/2014   Procedure: ABDOMINAL EZELLA;  Surgeon: Lonni GORMAN Blade, MD;  Location: [facility] CATH LAB;  Service: Cardiovascular;  Laterality: N/A;   ABDOMINAL AORTIC ENDOVASCULAR STENT GRAFT N/A 07/08/2014   Procedure: ABDOMINAL AORTIC ENDOVASCULAR STENT GRAFT/ GORE;  Surgeon: Lonni GORMAN Blade, MD;  Location: [facility] OR;  Service: Vascular;  Laterality: N/A;   CARDIAC CATHETERIZATION  10/31/2013   COLONOSCOPY     CORONARY ARTERY BYPASS GRAFT  10/31/1997   x 3 vessels   LEFT HEART CATH AND CORS/GRAFTS ANGIOGRAPHY N/A 10/07/2021   Procedure: LEFT HEART CATH AND CORS/GRAFTS ANGIOGRAPHY;  Surgeon: Verlin Lonni BIRCH, MD;  Location: [facility] INVASIVE CV LAB;  Service: Cardiovascular;  Laterality: N/A;   LEFT HEART CATHETERIZATION WITH CORONARY/GRAFT ANGIOGRAM N/A 05/29/2014   Procedure: LEFT HEART CATHETERIZATION WITH EL BILE;  Surgeon: Ezra GORMAN Shuck, MD;  Location: [facility] CATH LAB;  Service: Cardiovascular;  Laterality: N/A;   PENILE PROSTHESIS IMPLANT  08/31/2005   AMS inflatable penile prosthesis   RHINOPLASTY  10/31/1973    Social History:  reports that he quit smoking about 46 years ago. His smoking use included cigarettes. He has never used smokeless tobacco. He reports current alcohol use. He reports that he does not use drugs.  No Known Allergies  Family  History  Problem Relation Age of Onset   Prostate cancer Father 25   Heart disease Father    Skin cancer Father    Heart attack Father    Cancer Father    Deep vein thrombosis Father    Hyperlipidemia Father    Hypertension Father    Heart disease Mother    Skin cancer Mother    Heart attack Mother    Cancer Mother    Hyperlipidemia Mother    Hypertension Mother    Varicose Veins Mother    Peripheral vascular disease Mother        amputation   Sleep apnea Brother    Hyperlipidemia Brother    Hypertension Brother    Prostate cancer Brother 40   Colon cancer Other        aunt   Skin  cancer Brother    Skin cancer Sister    Cancer Sister    Heart disease Sister    Diabetes Neg Hx    Stroke Neg Hx      Prior to Admission medications   Medication Sig Start Date End Date Taking? Authorizing Provider  amLODipine  (NORVASC ) 10 MG tablet Take 1 tablet (10 mg total) by mouth at bedtime. 06/10/24   Court Dorn PARAS, MD  Ascorbic Acid (VITAMIN C ER PO) Take 500 mg by mouth daily.    [provider]  atorvastatin  (LIPITOR) 40 MG tablet Take 1 tablet (40 mg total) by mouth daily. 06/10/24   Court Dorn PARAS, MD  B Complex-Biotin-FA (B-COMPLEX PO) Take 1 tablet by mouth daily.    [provider]  co-enzyme Q-10 30 MG capsule Take 30 mg by mouth daily.    [provider]  ELIQUIS  5 MG TABS tablet TAKE 1 TABLET TWICE DAILY 02/07/24   Dick, Ernest H Jr., NP  fluorouracil (EFUDEX) 5 % cream Apply topically 2 (two) times daily. 06/09/23   [provider]  hydrochlorothiazide  (HYDRODIURIL ) 25 MG tablet Take 1 tablet (25 mg total) by mouth daily. 07/17/23   [name], Jose E, MD  hydrocortisone 2.5 % cream Apply 1 application topically as needed (skin irritation). Patient not taking: Reported on 06/10/2024 11/14/16   [provider]  losartan  (COZAAR ) 100 MG tablet Take 1 tablet (100 mg total) by mouth daily. 01/08/24   [name], Jose E, MD  magnesium  oxide (MAG-OX)  400 MG tablet Take 800 mg by mouth daily.    [provider]  Multiple Vitamins-Minerals (MULTIVITAMIN WITH MINERALS) tablet Take 1 tablet by mouth daily.    [provider]  Omega-3 Fatty Acids (OMEGA-3 FISH OIL ) 1200 MG CAPS Take 2 capsules (2,400 mg total) by mouth daily. Take 2 1,200 mg capsules daily 11/11/21   Eckard, Tammy, RPH-CPP  polyethylene glycol (MIRALAX  / GLYCOLAX ) packet Take 17 g by mouth daily.    [provider]    Physical Exam:  Vitals:   06/17/24 1915 06/17/24 1930 06/17/24 1945 06/17/24 2000  BP: 124/64 111/64 109/71 107/63  Pulse: (!) 101 (!) 132 (!) 107 (!) 108  Resp: 14 14 (!) 22 (!) 22  Temp:      TempSrc:      SpO2: 94% 94% 94% 94%  Weight:      Height:       Wt Readings from Last 3 Encounters:  06/17/24 101.2 kg  06/10/24 106.6 kg  05/22/24 104.3 kg   Body mass index is 32.93 kg/m.  General: Obese built, not in obvious distress, elderly male, alert awake and Communicative HENT: Normocephalic, No scleral pallor or icterus noted. Oral mucosa is moist.  Left ear with dressing in left neck with dressing with some induration and tenderness. Chest:  Clear breath sounds.  . No crackles or wheezes.  CVS: S1 &S2 heard. No murmur.  Regular rate and rhythm. Abdomen: Soft, nontender, nondistended.  Bowel sounds are heard. No abdominal mass palpated Extremities: No cyanosis, clubbing or edema.  Peripheral pulses are palpable. Psych: Alert, awake and oriented, normal mood at the time of my exam CNS:  No cranial nerve deficits.  Power equal in all extremities.   Skin: Warm and dry.  No rashes noted.  Labs on Admission:   CBC: Recent Labs  Lab 06/17/24 1734  WBC 9.8  NEUTROABS 8.1*  HGB 13.6  HCT 37.9*  MCV 89.6  PLT 75*  Basic Metabolic Panel: Recent Labs  Lab 06/17/24 1903 06/17/24 2109  NA 130*  --   K 2.7*  --   CL 94*  --   CO2 23  --   GLUCOSE 139*  --   BUN 39*  --   CREATININE 1.87*  --   CALCIUM  8.3*  --    MG  --  1.9    Liver Function Tests: Recent Labs  Lab 06/17/24 1903  AST 33  ALT 25  ALKPHOS 42  BILITOT 1.1  PROT 6.1*  ALBUMIN 3.3*   No results for input(s): LIPASE, AMYLASE in the last 168 hours. No results for input(s): AMMONIA in the last 168 hours.  Cardiac Enzymes: No results for input(s): CKTOTAL, CKMB, CKMBINDEX, TROPONINI in the last 168 hours.  BNP (last 3 results) No results for input(s): BNP in the last 8760 hours.  ProBNP (last 3 results) No results for input(s): PROBNP in the last 8760 hours.  CBG: No results for input(s): GLUCAP in the last 168 hours.  Lipase     Component Value Date/Time   LIPASE 37 04/14/2015 1925     Urinalysis    Component Value Date/Time   COLORURINE YELLOW 10/06/2021 1523   APPEARANCEUR CLEAR 10/06/2021 1523   LABSPEC 1.010 10/06/2021 1523   PHURINE 7.0 10/06/2021 1523   GLUCOSEU NEGATIVE 10/06/2021 1523   GLUCOSEU NEGATIVE 04/24/2020 0847   HGBUR NEGATIVE 10/06/2021 1523   HGBUR negative 11/10/2008 1322   BILIRUBINUR NEGATIVE 10/06/2021 1523   KETONESUR NEGATIVE 10/06/2021 1523   PROTEINUR NEGATIVE 10/06/2021 1523   UROBILINOGEN 0.2 04/24/2020 0847   NITRITE NEGATIVE 10/06/2021 1523   LEUKOCYTESUR NEGATIVE 10/06/2021 1523     Drugs of Abuse  No results found for: LABOPIA, COCAINSCRNUR, LABBENZ, AMPHETMU, THCU, LABBARB    Radiological Exams on Admission: CT Head Wo Contrast Result Date: 06/17/2024 CLINICAL DATA:  Altered level of consciousness EXAM: CT HEAD WITHOUT CONTRAST TECHNIQUE: Contiguous axial images were obtained from the base of the skull through the vertex without intravenous contrast. RADIATION DOSE REDUCTION: This exam was performed according to the departmental dose-optimization program which includes automated exposure control, adjustment of the mA and/or kV according to patient size and/or use of iterative reconstruction technique. COMPARISON:  10/06/2021 FINDINGS:  Brain: No acute infarct or hemorrhage. Lateral ventricles and midline structures are unremarkable. No acute extra-axial fluid collections. No mass effect. Vascular: No hyperdense vessel or unexpected calcification. Skull: Normal. Negative for fracture or focal lesion. Sinuses/Orbits: No acute finding. Other: None. IMPRESSION: 1. No acute intracranial process. Electronically Signed   By: Ozell Daring M.D.   On: 06/17/2024 18:24   DG Chest Port 1 View Result Date: 06/17/2024 CLINICAL DATA:  Sepsis EXAM: PORTABLE CHEST 1 VIEW COMPARISON:  10/06/2021 FINDINGS: 2 frontal views of the chest demonstrate stable enlargement the cardiac silhouette and postsurgical changes from CABG. No acute airspace disease, effusion, or pneumothorax. There are no acute bony abnormalities. IMPRESSION: 1. Stable chest, no acute process. Electronically Signed   By: Ozell Daring M.D.   On: 06/17/2024 17:38    EKG: Personally reviewed by me which shows irregular rhythm with tachycardia/atrial fibrillation   Consultant: None  Code Status: Full code  Microbiology blood cultures   Antibiotics: Vancomycin  and Zosyn   Family Communication:  Patients' condition and plan of care including tests being ordered have been discussed with the patient and the patient's spouse and daughter at bedside who indicate understanding and agree with the plan.   Status is: Observation  Severity of Illness: The appropriate patient status for this patient is OBSERVATION. Observation status is judged to be reasonable and necessary in order to provide the required intensity of service to ensure the patient's safety. The patient's presenting symptoms, physical exam findings, and initial radiographic and laboratory data in the context of their medical condition is felt to place them at decreased risk for further clinical deterioration. Furthermore, it is anticipated that the patient will be medically stable for discharge from the hospital within 2  midnights of admission.   Signed, Vernal Alstrom, MD [facility] 06/17/2024

## 2024-06-18 ENCOUNTER — Inpatient Hospital Stay (HOSPITAL_COMMUNITY)

## 2024-06-18 ENCOUNTER — Other Ambulatory Visit: Payer: Self-pay

## 2024-06-18 DIAGNOSIS — E876 Hypokalemia: Secondary | ICD-10-CM | POA: Diagnosis not present

## 2024-06-18 DIAGNOSIS — I4892 Unspecified atrial flutter: Secondary | ICD-10-CM

## 2024-06-18 DIAGNOSIS — N179 Acute kidney failure, unspecified: Secondary | ICD-10-CM

## 2024-06-18 DIAGNOSIS — I340 Nonrheumatic mitral (valve) insufficiency: Secondary | ICD-10-CM | POA: Diagnosis not present

## 2024-06-18 DIAGNOSIS — A4101 Sepsis due to Methicillin susceptible Staphylococcus aureus: Secondary | ICD-10-CM | POA: Diagnosis not present

## 2024-06-18 DIAGNOSIS — Z79899 Other long term (current) drug therapy: Secondary | ICD-10-CM | POA: Diagnosis not present

## 2024-06-18 DIAGNOSIS — I6523 Occlusion and stenosis of bilateral carotid arteries: Secondary | ICD-10-CM | POA: Diagnosis not present

## 2024-06-18 DIAGNOSIS — T86822 Skin graft (allograft) (autograft) infection: Secondary | ICD-10-CM | POA: Diagnosis not present

## 2024-06-18 DIAGNOSIS — I3489 Other nonrheumatic mitral valve disorders: Secondary | ICD-10-CM | POA: Diagnosis not present

## 2024-06-18 DIAGNOSIS — I480 Paroxysmal atrial fibrillation: Secondary | ICD-10-CM | POA: Diagnosis not present

## 2024-06-18 DIAGNOSIS — I4891 Unspecified atrial fibrillation: Secondary | ICD-10-CM | POA: Diagnosis not present

## 2024-06-18 DIAGNOSIS — I330 Acute and subacute infective endocarditis: Secondary | ICD-10-CM | POA: Diagnosis not present

## 2024-06-18 DIAGNOSIS — I672 Cerebral atherosclerosis: Secondary | ICD-10-CM | POA: Diagnosis not present

## 2024-06-18 DIAGNOSIS — Y838 Other surgical procedures as the cause of abnormal reaction of the patient, or of later complication, without mention of misadventure at the time of the procedure: Secondary | ICD-10-CM | POA: Diagnosis present

## 2024-06-18 DIAGNOSIS — Z6834 Body mass index (BMI) 34.0-34.9, adult: Secondary | ICD-10-CM | POA: Diagnosis not present

## 2024-06-18 DIAGNOSIS — E871 Hypo-osmolality and hyponatremia: Secondary | ICD-10-CM | POA: Diagnosis present

## 2024-06-18 DIAGNOSIS — R7881 Bacteremia: Secondary | ICD-10-CM

## 2024-06-18 DIAGNOSIS — N1831 Chronic kidney disease, stage 3a: Secondary | ICD-10-CM | POA: Diagnosis not present

## 2024-06-18 DIAGNOSIS — G9341 Metabolic encephalopathy: Secondary | ICD-10-CM | POA: Diagnosis not present

## 2024-06-18 DIAGNOSIS — H9589 Other postprocedural complications and disorders of the ear and mastoid process, not elsewhere classified: Secondary | ICD-10-CM

## 2024-06-18 DIAGNOSIS — I63443 Cerebral infarction due to embolism of bilateral cerebellar arteries: Secondary | ICD-10-CM | POA: Diagnosis not present

## 2024-06-18 DIAGNOSIS — R509 Fever, unspecified: Secondary | ICD-10-CM | POA: Diagnosis not present

## 2024-06-18 DIAGNOSIS — I21A1 Myocardial infarction type 2: Secondary | ICD-10-CM | POA: Diagnosis not present

## 2024-06-18 DIAGNOSIS — R7989 Other specified abnormal findings of blood chemistry: Secondary | ICD-10-CM | POA: Diagnosis not present

## 2024-06-18 DIAGNOSIS — A419 Sepsis, unspecified organism: Secondary | ICD-10-CM | POA: Diagnosis present

## 2024-06-18 DIAGNOSIS — R29705 NIHSS score 5: Secondary | ICD-10-CM | POA: Diagnosis not present

## 2024-06-18 DIAGNOSIS — I129 Hypertensive chronic kidney disease with stage 1 through stage 4 chronic kidney disease, or unspecified chronic kidney disease: Secondary | ICD-10-CM | POA: Diagnosis not present

## 2024-06-18 DIAGNOSIS — I38 Endocarditis, valve unspecified: Secondary | ICD-10-CM | POA: Diagnosis not present

## 2024-06-18 DIAGNOSIS — D72829 Elevated white blood cell count, unspecified: Secondary | ICD-10-CM

## 2024-06-18 DIAGNOSIS — I76 Septic arterial embolism: Secondary | ICD-10-CM | POA: Diagnosis not present

## 2024-06-18 DIAGNOSIS — R6520 Severe sepsis without septic shock: Secondary | ICD-10-CM | POA: Diagnosis not present

## 2024-06-18 DIAGNOSIS — Z951 Presence of aortocoronary bypass graft: Secondary | ICD-10-CM | POA: Diagnosis not present

## 2024-06-18 DIAGNOSIS — Z85820 Personal history of malignant melanoma of skin: Secondary | ICD-10-CM | POA: Diagnosis not present

## 2024-06-18 DIAGNOSIS — I2510 Atherosclerotic heart disease of native coronary artery without angina pectoris: Secondary | ICD-10-CM | POA: Diagnosis not present

## 2024-06-18 DIAGNOSIS — I700 Atherosclerosis of aorta: Secondary | ICD-10-CM | POA: Diagnosis not present

## 2024-06-18 DIAGNOSIS — Z7901 Long term (current) use of anticoagulants: Secondary | ICD-10-CM | POA: Diagnosis not present

## 2024-06-18 DIAGNOSIS — R29700 NIHSS score 0: Secondary | ICD-10-CM | POA: Diagnosis not present

## 2024-06-18 DIAGNOSIS — I6782 Cerebral ischemia: Secondary | ICD-10-CM | POA: Diagnosis not present

## 2024-06-18 DIAGNOSIS — I6340 Cerebral infarction due to embolism of unspecified cerebral artery: Secondary | ICD-10-CM | POA: Diagnosis not present

## 2024-06-18 DIAGNOSIS — T8149XA Infection following a procedure, other surgical site, initial encounter: Secondary | ICD-10-CM | POA: Diagnosis not present

## 2024-06-18 DIAGNOSIS — B9561 Methicillin susceptible Staphylococcus aureus infection as the cause of diseases classified elsewhere: Secondary | ICD-10-CM

## 2024-06-18 DIAGNOSIS — R4182 Altered mental status, unspecified: Secondary | ICD-10-CM | POA: Diagnosis not present

## 2024-06-18 DIAGNOSIS — T8131XA Disruption of external operation (surgical) wound, not elsewhere classified, initial encounter: Secondary | ICD-10-CM | POA: Diagnosis not present

## 2024-06-18 DIAGNOSIS — I10 Essential (primary) hypertension: Secondary | ICD-10-CM

## 2024-06-18 DIAGNOSIS — R0603 Acute respiratory distress: Secondary | ICD-10-CM | POA: Diagnosis not present

## 2024-06-18 DIAGNOSIS — R410 Disorientation, unspecified: Secondary | ICD-10-CM | POA: Diagnosis not present

## 2024-06-18 DIAGNOSIS — E039 Hypothyroidism, unspecified: Secondary | ICD-10-CM | POA: Diagnosis present

## 2024-06-18 DIAGNOSIS — Z87891 Personal history of nicotine dependence: Secondary | ICD-10-CM | POA: Diagnosis not present

## 2024-06-18 DIAGNOSIS — L7631 Postprocedural hematoma of skin and subcutaneous tissue following a dermatologic procedure: Secondary | ICD-10-CM | POA: Diagnosis present

## 2024-06-18 DIAGNOSIS — I4819 Other persistent atrial fibrillation: Secondary | ICD-10-CM | POA: Diagnosis present

## 2024-06-18 DIAGNOSIS — E782 Mixed hyperlipidemia: Secondary | ICD-10-CM | POA: Diagnosis present

## 2024-06-18 DIAGNOSIS — R0989 Other specified symptoms and signs involving the circulatory and respiratory systems: Secondary | ICD-10-CM | POA: Diagnosis not present

## 2024-06-18 DIAGNOSIS — T679XXA Effect of heat and light, unspecified, initial encounter: Secondary | ICD-10-CM | POA: Diagnosis not present

## 2024-06-18 DIAGNOSIS — Z8249 Family history of ischemic heart disease and other diseases of the circulatory system: Secondary | ICD-10-CM | POA: Diagnosis not present

## 2024-06-18 LAB — CBC
HCT: 33.8 % — ABNORMAL LOW (ref 39.0–52.0)
HCT: 34.9 % — ABNORMAL LOW (ref 39.0–52.0)
Hemoglobin: 12.3 g/dL — ABNORMAL LOW (ref 13.0–17.0)
Hemoglobin: 12.4 g/dL — ABNORMAL LOW (ref 13.0–17.0)
MCH: 32.5 pg (ref 26.0–34.0)
MCH: 32.3 pg (ref 26.0–34.0)
MCHC: 36.4 g/dL — ABNORMAL HIGH (ref 30.0–36.0)
MCHC: 35.5 g/dL (ref 30.0–36.0)
MCV: 89.4 fL (ref 80.0–100.0)
MCV: 90.9 fL (ref 80.0–100.0)
Platelets: 65 K/uL — ABNORMAL LOW (ref 150–400)
Platelets: 59 K/uL — ABNORMAL LOW (ref 150–400)
RBC: 3.78 MIL/uL — ABNORMAL LOW (ref 4.22–5.81)
RBC: 3.84 MIL/uL — ABNORMAL LOW (ref 4.22–5.81)
RDW: 13.8 % (ref 11.5–15.5)
RDW: 14 % (ref 11.5–15.5)
WBC: 11 K/uL — ABNORMAL HIGH (ref 4.0–10.5)
WBC: 7.5 K/uL (ref 4.0–10.5)
nRBC: 0 % (ref 0.0–0.2)
nRBC: 0 % (ref 0.0–0.2)

## 2024-06-18 LAB — TROPONIN I (HIGH SENSITIVITY)
Troponin I (High Sensitivity): 4819 ng/L (ref <18)
Troponin I (High Sensitivity): 6153 ng/L (ref <18)
Troponin I (High Sensitivity): 6715 ng/L (ref <18)
Troponin I (High Sensitivity): 5648 ng/L (ref <18)

## 2024-06-18 LAB — COMPREHENSIVE METABOLIC PANEL WITH GFR
ALT: 34 U/L (ref 0–44)
AST: 95 U/L — ABNORMAL HIGH (ref 15–41)
Albumin: 2.9 g/dL — ABNORMAL LOW (ref 3.5–5.0)
Alkaline Phosphatase: 37 U/L — ABNORMAL LOW (ref 38–126)
Anion gap: 13 (ref 5–15)
BUN: 38 mg/dL — ABNORMAL HIGH (ref 8–23)
CO2: 20 mmol/L — ABNORMAL LOW (ref 22–32)
Calcium: 8.1 mg/dL — ABNORMAL LOW (ref 8.9–10.3)
Chloride: 100 mmol/L (ref 98–111)
Creatinine, Ser: 1.93 mg/dL — ABNORMAL HIGH (ref 0.61–1.24)
GFR, Estimated: 34 mL/min — ABNORMAL LOW (ref >60)
Glucose, Bld: 130 mg/dL — ABNORMAL HIGH (ref 70–99)
Potassium: 3 mmol/L — ABNORMAL LOW (ref 3.5–5.1)
Sodium: 133 mmol/L — ABNORMAL LOW (ref 135–145)
Total Bilirubin: 1.1 mg/dL (ref 0.0–1.2)
Total Protein: 5.6 g/dL — ABNORMAL LOW (ref 6.5–8.1)

## 2024-06-18 LAB — ECHOCARDIOGRAM COMPLETE
Area-P 1/2: 2.44 cm2
Est EF: 50
Height: 69 in
S' Lateral: 4.3 cm
Weight: 3749.58 [oz_av]

## 2024-06-18 LAB — LACTIC ACID, PLASMA: Lactic Acid, Venous: 1.2 mmol/L (ref 0.5–1.9)

## 2024-06-18 LAB — BLOOD CULTURE ID PANEL (REFLEXED) - BCID2

## 2024-06-18 LAB — RESP PANEL BY RT-PCR (RSV, FLU A&B, COVID)  RVPGX2
Influenza A by PCR: NEGATIVE
Influenza B by PCR: NEGATIVE
Resp Syncytial Virus by PCR: NEGATIVE
SARS Coronavirus 2 by RT PCR: NEGATIVE

## 2024-06-18 LAB — PROTIME-INR
INR: 1.5 — ABNORMAL HIGH (ref 0.8–1.2)
Prothrombin Time: 19.3 s — ABNORMAL HIGH (ref 11.4–15.2)

## 2024-06-18 LAB — MAGNESIUM: Magnesium: 1.7 mg/dL (ref 1.7–2.4)

## 2024-06-18 LAB — APTT: aPTT: 67 s — ABNORMAL HIGH (ref 24–36)

## 2024-06-18 LAB — HEPARIN LEVEL (UNFRACTIONATED): Heparin Unfractionated: >1.1 [IU]/mL — ABNORMAL HIGH (ref 0.30–0.70)

## 2024-06-18 MED ORDER — PERFLUTREN LIPID MICROSPHERE
1.0000 mL | INTRAVENOUS | Status: AC | PRN
  Administered 2024-06-18: 5 mL via INTRAVENOUS

## 2024-06-18 MED ORDER — DILTIAZEM LOAD VIA INFUSION: 10.0000 mg | Freq: Once | INTRAVENOUS | Status: DC

## 2024-06-18 MED ORDER — METOPROLOL TARTRATE 5 MG/5ML IV SOLN
5.0000 mg | INTRAVENOUS | Status: DC | PRN
  Filled 2024-06-18: qty 5

## 2024-06-18 MED ORDER — CEFAZOLIN SODIUM-DEXTROSE 2-4 GM/100ML-% IV SOLN
2.0000 g | Freq: Three times a day (TID) | INTRAVENOUS | Status: DC
  Administered 2024-06-18 – 2024-06-25 (×21): 2 g via INTRAVENOUS
  Filled 2024-06-18 (×21): qty 100

## 2024-06-18 MED ORDER — METOPROLOL TARTRATE 12.5 MG HALF TABLET
12.5000 mg | ORAL_TABLET | Freq: Two times a day (BID) | ORAL | Status: DC
  Administered 2024-06-18 – 2024-06-19 (×3): 12.5 mg via ORAL
  Filled 2024-06-18 (×3): qty 1

## 2024-06-18 MED ORDER — CHLORHEXIDINE GLUCONATE CLOTH 2 % EX PADS
6.0000 | MEDICATED_PAD | Freq: Every day | CUTANEOUS | Status: DC
  Administered 2024-06-18 – 2024-06-22 (×5): 6 via TOPICAL

## 2024-06-18 MED ORDER — METOPROLOL TARTRATE 25 MG PO TABS
25.0000 mg | ORAL_TABLET | Freq: Two times a day (BID) | ORAL | Status: DC
  Filled 2024-06-18: qty 1

## 2024-06-18 MED ORDER — HEPARIN (PORCINE) 25000 UT/250ML-% IV SOLN
1200.0000 [IU]/h | INTRAVENOUS | Status: DC
  Administered 2024-06-18 – 2024-06-20 (×3): 1200 [IU]/h via INTRAVENOUS
  Filled 2024-06-18 (×3): qty 250

## 2024-06-18 MED ORDER — DILTIAZEM HCL-DEXTROSE 125-5 MG/125ML-% IV SOLN (PREMIX)
5.0000 mg/h | INTRAVENOUS | Status: DC
  Administered 2024-06-18: 5 mg/h via INTRAVENOUS
  Filled 2024-06-18: qty 125

## 2024-06-18 MED ORDER — POTASSIUM CHLORIDE 10 MEQ/100ML IV SOLN
10.0000 meq | INTRAVENOUS | Status: AC
  Administered 2024-06-18 (×4): 10 meq via INTRAVENOUS
  Filled 2024-06-18 (×4): qty 100

## 2024-06-18 MED ORDER — DILTIAZEM HCL 25 MG/5ML IV SOLN
10.0000 mg | Freq: Once | INTRAVENOUS | Status: AC
  Administered 2024-06-18: 10 mg via INTRAVENOUS
  Filled 2024-06-18: qty 5

## 2024-06-18 NOTE — Progress Notes (Signed)
 PHARMACY - ANTICOAGULATION CONSULT NOTE  Pharmacy Consult for IV Heparin  Indication: NSTEMI, atrial fibrillation  No Known Allergies  Patient Measurements: Height: 5' 9 (175.3 cm) Weight: 106.3 kg (234 lb 5.6 oz) IBW/kg (Calculated) : 70.7 HEPARIN  DW (KG): 92.2  Vital Signs: Temp: 98.1 F (36.7 C) (08/19 1502) Temp Source: Oral (08/19 1502) BP: 108/81 (08/19 1805) Pulse Rate: 94 (08/19 1805)  Labs: Recent Labs    06/17/24 1734 06/17/24 1903 06/18/24 0557 06/18/24 1200 06/18/24 1740  HGB 13.6  --  12.3*  --  12.4*  HCT 37.9*  --  33.8*  --  34.9*  PLT 75*  --  65*  --  59*  APTT  --   --   --   --  67*  LABPROT 18.4*  --  19.3*  --   --   INR 1.5*  --  1.5*  --   --   HEPARINUNFRC  --   --   --   --  >1.10*  CREATININE  --  1.87* 1.93*  --   --   TROPONINIHS  --   --  5,180* 6,153*  --     Estimated Creatinine Clearance: 34.2 mL/min (A) (by C-G formula based on SCr of 1.93 mg/dL (H)).  Assessment: 83 yr old male on Eliquis  5 mg BID prior to admit for atrial fibrillation. Eliquis  continued on admit 8/18 but dose reduced to 2.5 mg BID with AKI and creatinine >1.5 and >80 yrs old.   Eliquis  2.5 mg given at 11pm on 8/18.  To change to IV heparin  for NSTEMI.  Cardiology consulted.    Thrombocytopenia, Plt down to 59. No bleeding reported. BCID with Staph aureus, so resistance. Likely contributing to low platelets. Will use aPTTs for heparin  monitoring while heparin  levels are expected to be falsely elevated due to recent Eliquis  doses.  Heparin  level >1.1 (affected by Eliquis ), aPTT 67 sec (therapeutic) on infusion at 1200 units/hr. No bleeding noted.  Goal of Therapy:  Heparin  level 0.3-0.7 units/ml aPTT 66-102 seconds Monitor platelets by anticoagulation protocol: Yes   Plan:  Continue heparin  infusion at 1200 units/hr Daily aPTT to confirm therapeutic Consider holding IV heparin  if platelet count drops <50K  Vito Ralph, PharmD, BCPS Please see amion for  complete clinical pharmacist phone list 06/18/2024,7:11 PM

## 2024-06-18 NOTE — Consult Note (Signed)
 [facility]    Date of Admission:  06/17/2024     Total days of antibiotics 1               Reason for Consult: MSSA Bacteremia    Referring Provider: [employer] Primary Care Provider: [name], MD   ASSESSMENT:  Philip [name] is an 83 y/o caucasian male with history of skin cancer and recent skin grafting around his neck and left ear arriving with altered mental status and found to have MSSA bacteremia. Suspected source of infection is his left ear/neck. No abscess or fluid collection on imaging.  Discussed plan of care to continue with Cefazolin  for MSSA bacteremia and will need to obtain blood cultures to determine clearance and TTE to check for endocarditis. May need to consider TEE although this appears to be an acute/rapid onset infection. Has elevated creatinine consistent with acute kidney injury in the setting of infection and anticipate continued recovery with treatment. Standard/universal precautions. Remaining medical and supportive care per Internal Medicine.   PLAN:  Continue current dose of Cefazolin  Obtain blood cultures for clearance of bacteremia.  TTE to check for endocarditis. Continue basic post-operative wound care.  Monitor renal function for improvement of AKI. Standard/universal precautions.  Remaining medical and supportive care per Internal Medicine.    Principal Problem:   MSSA bacteremia Active Problems:   Mixed hyperlipidemia   Essential hypertension   History of skin cancer   Atrial fibrillation (HCC)   Fever   Altered mental status   Sepsis (HCC)    atorvastatin   40 mg Oral Daily   multivitamin with minerals  1 tablet Oral Daily   polyethylene glycol  17 g Oral Daily   sodium chloride  flush  3 mL Intravenous Q12H     HPI: Philip [name] is a 83 y.o. male with previous medical history of sleep apnea, hypertension, heart failure, Peyronies disease s/p penile implant, and melanoma admitted with fever  and altered mental status.   Mr. Philip [name] recently underwent skin graft surgery on 06/14/24 that was complicated by bleeding issues that was resolved with sutures and cauterization. He was going for follow up and was found in the parking lot with altered mental status. He was taking doxycycline at the time. EMS recorded a temperature of 101 F. Continued to be febrile on arrival with temperature of 103 F and WBC count of 11,000. Chest x-ray with no acute process. Head CT with no intracranial process. CT soft tissue neck with mild thickening of the left sternocleidomastoid in the upper neck with adjacent soft tissue thickening. Started on broad spectrum antibiotics with Vancomycin  and piperacillin -tazobactam. Blood cultures were positive for MSSA.   Mrs. Philip [name] is at beside and provides the history with Mr. Philip [name] being lethargic and not able any additional information. He has not implanted devices, mental implants or joint arthroplasties.   Review of Systems: Review of Systems  Unable to perform ROS: Acuity of condition     Past Medical History:  Diagnosis Date   AAA (abdominal aortic aneurysm) (HCC)    Arthritis    Atrial fibrillation (HCC)    CAD (coronary artery disease)    had a MI , s/p CABG   Cataracts, bilateral    immature   CHF (congestive heart failure) (HCC)    Dysrhythmia    paroximal a fib   GERD (gastroesophageal reflux disease)    takes Omeprazole  daily   History of colon polyps  History of kidney stones    History of shingles    Hyperlipidemia    Hypertension    takes Amlodipine ,Metoprolol ,and Lisinopril  daily   Joint pain    Joint swelling    Myocardial infarction (HCC)    several with last one being 2001(but never knew about any except 2001)   OSA (obstructive sleep apnea)    started CPAP 12/09   Peripheral vascular disease (HCC)    Peyronie's disease    s/p penile implant in 2006   Pneumonia    as a child   Skin cancer    several , one of them was melanoma  (sees derm routinely)   Sleep apnea    Tingling    feet occasionally   Tinnitus     Social History   Tobacco Use   Smoking status: Former    Current packs/day: 0.00    Types: Cigarettes    Quit date: 1979    Years since quitting: 46.6   Smokeless tobacco: Never   Tobacco comments:    quit smoking in 1979  Vaping Use   Vaping status: Never Used  Substance Use Topics   Alcohol use: Yes    Comment: occasionally - once per week   Drug use: No    Family History  Problem Relation Age of Onset   Prostate cancer Father 42   Heart disease Father    Skin cancer Father    Heart attack Father    Cancer Father    Deep vein thrombosis Father    Hyperlipidemia Father    Hypertension Father    Heart disease Mother    Skin cancer Mother    Heart attack Mother    Cancer Mother    Hyperlipidemia Mother    Hypertension Mother    Varicose Veins Mother    Peripheral vascular disease Mother        amputation   Sleep apnea Brother    Hyperlipidemia Brother    Hypertension Brother    Prostate cancer Brother 59   Colon cancer Other        aunt   Skin cancer Brother    Skin cancer Sister    Cancer Sister    Heart disease Sister    Diabetes Neg Hx    Stroke Neg Hx     No Known Allergies  OBJECTIVE: Blood pressure (!) 124/57, pulse 96, temperature 98.1 F (36.7 C), temperature source Oral, resp. rate (!) 21, height 5' 9 (1.753 m), weight 106.3 kg, SpO2 96%.  Physical Exam Constitutional:      General: He is not in acute distress.    Appearance: He is well-developed.  Cardiovascular:     Rate and Rhythm: Normal rate and regular rhythm.     Heart sounds: Normal heart sounds.  Pulmonary:     Effort: Pulmonary effort is normal.     Breath sounds: Normal breath sounds.  Skin:    General: Skin is warm and dry.     Comments: Bandage without purulent drainage on his neck and ear  Neurological:     Mental Status: He is alert.     Lab Results Lab Results  Component  Value Date   WBC 11.0 (H) 06/18/2024   HGB 12.3 (L) 06/18/2024   HCT 33.8 (L) 06/18/2024   MCV 89.4 06/18/2024   PLT 65 (L) 06/18/2024    Lab Results  Component Value Date   CREATININE 1.93 (H) 06/18/2024   BUN 38 (H) 06/18/2024   NA  133 (L) 06/18/2024   K 3.0 (L) 06/18/2024   CL 100 06/18/2024   CO2 20 (L) 06/18/2024    Lab Results  Component Value Date   ALT 34 06/18/2024   AST 95 (H) 06/18/2024   ALKPHOS 37 (L) 06/18/2024   BILITOT 1.1 06/18/2024     Microbiology: Recent Results (from the past 240 hours)  Blood Culture (routine x 2)     Status: None (Preliminary result)   Collection Time: 06/17/24  5:16 PM   Specimen: BLOOD LEFT FOREARM  Result Value Ref Range Status   Specimen Description BLOOD LEFT FOREARM  Final   Special Requests   Final    BOTTLES DRAWN AEROBIC AND ANAEROBIC Blood Culture adequate volume   Culture  Setup Time   Final    GRAM POSITIVE COCCI IN BOTH AEROBIC AND ANAEROBIC BOTTLES CRITICAL VALUE NOTED.  VALUE IS CONSISTENT WITH PREVIOUSLY REPORTED AND CALLED VALUE. Performed at [facility], [address] [place], [place] [address]    Culture GRAM POSITIVE COCCI  Final   Report Status PENDING  Incomplete  Blood Culture (routine x 2)     Status: None (Preliminary result)   Collection Time: 06/17/24  5:20 PM   Specimen: BLOOD RIGHT FOREARM  Result Value Ref Range Status   Specimen Description BLOOD RIGHT FOREARM  Final   Special Requests   Final    BOTTLES DRAWN AEROBIC AND ANAEROBIC Blood Culture adequate volume   Culture  Setup Time   Final    GRAM POSITIVE COCCI IN BOTH AEROBIC AND ANAEROBIC BOTTLES CRITICAL RESULT CALLED TO, READ BACK BY AND VERIFIED WITH: PHARMD E.MARTIN AT 9180 ON 06/18/2024 BY T.SAAD. Performed at [facility], [address], [place], [place] [address]    Culture GRAM POSITIVE COCCI  Final   Report Status PENDING  Incomplete  Blood Culture ID Panel (Reflexed)     Status: Abnormal   Collection Time:  06/17/24  5:20 PM  Result Value Ref Range Status   Enterococcus faecalis NOT DETECTED NOT DETECTED Final   Enterococcus Faecium NOT DETECTED NOT DETECTED Final   Listeria monocytogenes NOT DETECTED NOT DETECTED Final   Staphylococcus species DETECTED (A) NOT DETECTED Final    Comment: CRITICAL RESULT CALLED TO, READ BACK BY AND VERIFIED WITH: PHARMD E.MARTIN AT 9180 ON 06/18/2024 BY T.SAAD.    Staphylococcus aureus (BCID) DETECTED (A) NOT DETECTED Final    Comment: CRITICAL RESULT CALLED TO, READ BACK BY AND VERIFIED WITH: PHARMD E.MARTIN AT 9180 ON 06/18/2024 BY T.SAAD.    Staphylococcus epidermidis NOT DETECTED NOT DETECTED Final   Staphylococcus lugdunensis NOT DETECTED NOT DETECTED Final   Streptococcus species NOT DETECTED NOT DETECTED Final   Streptococcus agalactiae NOT DETECTED NOT DETECTED Final   Streptococcus pneumoniae NOT DETECTED NOT DETECTED Final   Streptococcus pyogenes NOT DETECTED NOT DETECTED Final   A.calcoaceticus-baumannii NOT DETECTED NOT DETECTED Final   Bacteroides fragilis NOT DETECTED NOT DETECTED Final   Enterobacterales NOT DETECTED NOT DETECTED Final   Enterobacter cloacae complex NOT DETECTED NOT DETECTED Final   Escherichia coli NOT DETECTED NOT DETECTED Final   Klebsiella aerogenes NOT DETECTED NOT DETECTED Final   Klebsiella oxytoca NOT DETECTED NOT DETECTED Final   Klebsiella pneumoniae NOT DETECTED NOT DETECTED Final   Proteus species NOT DETECTED NOT DETECTED Final   Salmonella species NOT DETECTED NOT DETECTED Final   Serratia marcescens NOT DETECTED NOT DETECTED Final   Haemophilus influenzae NOT DETECTED NOT DETECTED Final  Neisseria meningitidis NOT DETECTED NOT DETECTED Final   Pseudomonas aeruginosa NOT DETECTED NOT DETECTED Final   Stenotrophomonas maltophilia NOT DETECTED NOT DETECTED Final   Candida albicans NOT DETECTED NOT DETECTED Final   Candida auris NOT DETECTED NOT DETECTED Final   Candida glabrata NOT DETECTED NOT DETECTED  Final   Candida krusei NOT DETECTED NOT DETECTED Final   Candida parapsilosis NOT DETECTED NOT DETECTED Final   Candida tropicalis NOT DETECTED NOT DETECTED Final   Cryptococcus neoformans/gattii NOT DETECTED NOT DETECTED Final   Meth resistant mecA/C and MREJ NOT DETECTED NOT DETECTED Final    Comment: Performed at [facility], [address] [place], [place] [address]  Resp panel by RT-PCR (RSV, Flu A&B, Covid) Anterior Nasal Swab     Status: None   Collection Time: 06/18/24  8:58 AM   Specimen: Anterior Nasal Swab  Result Value Ref Range Status   SARS Coronavirus 2 by RT PCR NEGATIVE NEGATIVE Final   Influenza A by PCR NEGATIVE NEGATIVE Final   Influenza B by PCR NEGATIVE NEGATIVE Final    Comment: (NOTE) The Xpert Xpress SARS-CoV-2/FLU/RSV plus assay is intended as an aid in the diagnosis of influenza from Nasopharyngeal swab specimens and should not be used as a sole basis for treatment. Nasal washings and aspirates are unacceptable for Xpert Xpress SARS-CoV-2/FLU/RSV testing.  Fact Sheet for Patients: BloggerCourse.com  Fact Sheet for Healthcare Providers: SeriousBroker.it  This test is not yet approved or cleared by the [place]  FDA and has been authorized for detection and/or diagnosis of SARS-CoV-2 by FDA under an Emergency Use Authorization (EUA). This EUA will remain in effect (meaning this test can be used) for the duration of the COVID-19 declaration under Section 564(b)(1) of the Act, 21 U.S.C. section 360bbb-3(b)(1), unless the authorization is terminated or revoked.     Resp Syncytial Virus by PCR NEGATIVE NEGATIVE Final    Comment: (NOTE) Fact Sheet for Patients: BloggerCourse.com  Fact Sheet for Healthcare Providers: SeriousBroker.it  This test is not yet approved or cleared by the [place]  FDA and has been authorized for detection and/or  diagnosis of SARS-CoV-2 by FDA under an Emergency Use Authorization (EUA). This EUA will remain in effect (meaning this test can be used) for the duration of the COVID-19 declaration under Section 564(b)(1) of the Act, 21 U.S.C. section 360bbb-3(b)(1), unless the authorization is terminated or revoked.  Performed at [facility], [address], [place], [place] [address]     I have personally spent 32 minutes involved in face-to-face and non-face-to-face activities for this patient on the day of the visit. Professional time spent includes the following activities: preparing to see the patient (review of tests), obtaining and reviewing separately obtained history (admission/discharge record), performing a medically appropriate examination, ordering medications, communicating with other health care professionals, documenting clinical information in the EMR, communicating results and counseling patient and family regarding medication and plan of care, and care coordination.    Greg Levell Tavano, NP [facility]  06/18/2024  1:53 PM

## 2024-06-18 NOTE — Progress Notes (Signed)
 PHARMACY - PHYSICIAN COMMUNICATION CRITICAL VALUE ALERT - BLOOD CULTURE IDENTIFICATION (BCID)  Philip [name] is an 83 y.o. male who presented to [facility] on 06/17/2024 with a chief complaint of fever/confusion  Assessment: 34 YOM who presented with fever/confusion and concern for infection. Now with 4/4 blood cultures growing GPC in clusters with BCID detecting MSSA. The patient was noted to have a recent skin grafting of his ear - potentially a source.  Name of physician (or Provider) Contacted: Sreeram + automatic ID consult  Current antibiotics: Vancomycin  + Zosyn   Changes to prescribed antibiotics recommended:  Narrow to Cefazolin  2g IV every 8 hours, ID team to see for full work-up  Results for orders placed or performed during the hospital encounter of 06/17/24  Blood Culture ID Panel (Reflexed) (Collected: 06/17/2024  5:20 PM)  Result Value Ref Range   Enterococcus faecalis NOT DETECTED NOT DETECTED   Enterococcus Faecium NOT DETECTED NOT DETECTED   Listeria monocytogenes NOT DETECTED NOT DETECTED   Staphylococcus species DETECTED (A) NOT DETECTED   Staphylococcus aureus (BCID) DETECTED (A) NOT DETECTED   Staphylococcus epidermidis NOT DETECTED NOT DETECTED   Staphylococcus lugdunensis NOT DETECTED NOT DETECTED   Streptococcus species NOT DETECTED NOT DETECTED   Streptococcus agalactiae NOT DETECTED NOT DETECTED   Streptococcus pneumoniae NOT DETECTED NOT DETECTED   Streptococcus pyogenes NOT DETECTED NOT DETECTED   A.calcoaceticus-baumannii NOT DETECTED NOT DETECTED   Bacteroides fragilis NOT DETECTED NOT DETECTED   Enterobacterales NOT DETECTED NOT DETECTED   Enterobacter cloacae complex NOT DETECTED NOT DETECTED   Escherichia coli NOT DETECTED NOT DETECTED   Klebsiella aerogenes NOT DETECTED NOT DETECTED   Klebsiella oxytoca NOT DETECTED NOT DETECTED   Klebsiella pneumoniae NOT DETECTED NOT DETECTED   Proteus species NOT DETECTED NOT DETECTED   Salmonella species NOT  DETECTED NOT DETECTED   Serratia marcescens NOT DETECTED NOT DETECTED   Haemophilus influenzae NOT DETECTED NOT DETECTED   Neisseria meningitidis NOT DETECTED NOT DETECTED   Pseudomonas aeruginosa NOT DETECTED NOT DETECTED   Stenotrophomonas maltophilia NOT DETECTED NOT DETECTED   Candida albicans NOT DETECTED NOT DETECTED   Candida auris NOT DETECTED NOT DETECTED   Candida glabrata NOT DETECTED NOT DETECTED   Candida krusei NOT DETECTED NOT DETECTED   Candida parapsilosis NOT DETECTED NOT DETECTED   Candida tropicalis NOT DETECTED NOT DETECTED   Cryptococcus neoformans/gattii NOT DETECTED NOT DETECTED   Meth resistant mecA/C and MREJ NOT DETECTED NOT DETECTED    Thank you for allowing pharmacy to be a part of this patient's care.  Almarie Lunger, PharmD, BCPS, BCIDP Infectious Diseases Clinical Pharmacist 06/18/2024 11:37 AM   **Pharmacist phone directory can now be found on [email or web address] ([email or web address]).  Listed under [facility].

## 2024-06-18 NOTE — Progress Notes (Signed)
 Critical Troponin 5,648, previous value 6,715. Segars, MD notified, pt asymptomatic, denied chest pain, SOB, palpitations.  Will continue to monitor pt closely.

## 2024-06-18 NOTE — Progress Notes (Signed)
   06/18/24 0535  Assess: MEWS Score  Temp 99.1 F (37.3 C)  BP (!) 101/56  MAP (mmHg) 69  Pulse Rate 80  ECG Heart Rate (!) 105  Resp (!) 22  SpO2 96 %  O2 Device Room Air  Assess: MEWS Score  MEWS Temp 0  MEWS Systolic 0  MEWS Pulse 1  MEWS RR 1  MEWS LOC 1  MEWS Score 3  MEWS Score Color Yellow  Assess: if the MEWS score is Yellow or Red  Were vital signs accurate and taken at a resting state? Yes  Does the patient meet 2 or more of the SIRS criteria? Yes  MEWS guidelines implemented  Yes, yellow  Treat  MEWS Interventions Considered administering scheduled or prn medications/treatments as ordered  Take Vital Signs  Increase Vital Sign Frequency  Yellow: Q2hr x1, continue Q4hrs until patient remains green for 12hrs  Escalate  MEWS: Escalate Yellow: Discuss with charge nurse and consider notifying provider and/or RRT  Notify: Charge Nurse/RN  Name of Charge Nurse/RN Notified Fred, RN  Assess: SIRS CRITERIA  SIRS Temperature  0  SIRS Respirations  1  SIRS Pulse 1  SIRS WBC 0  SIRS Score Sum  2

## 2024-06-18 NOTE — Progress Notes (Addendum)
 PHARMACY - ANTICOAGULATION CONSULT NOTE  Pharmacy Consult for Eliquis  > IV Heparin  Indication: NSTEMI, atrial fibrillation  No Known Allergies  Patient Measurements: Height: 5' 9 (175.3 cm) Weight: 106.3 kg (234 lb 5.6 oz) IBW/kg (Calculated) : 70.7 HEPARIN  DW (KG): 92.2  Vital Signs: Temp: 99.5 F (37.5 C) (08/19 0707) Temp Source: Axillary (08/19 0707) BP: 123/70 (08/19 0839) Pulse Rate: 103 (08/19 0707)  Labs: Recent Labs    06/17/24 1734 06/17/24 1903 06/18/24 0557  HGB 13.6  --  12.3*  HCT 37.9*  --  33.8*  PLT 75*  --  65*  LABPROT 18.4*  --  19.3*  INR 1.5*  --  1.5*  CREATININE  --  1.87* 1.93*  TROPONINIHS  --   --  5,180*    Estimated Creatinine Clearance: 34.2 mL/min (A) (by C-G formula based on SCr of 1.93 mg/dL (H)).   Medical History: Past Medical History:  Diagnosis Date   AAA (abdominal aortic aneurysm) (HCC)    Arthritis    Atrial fibrillation (HCC)    CAD (coronary artery disease)    had a MI , s/p CABG   Cataracts, bilateral    immature   CHF (congestive heart failure) (HCC)    Dysrhythmia    paroximal a fib   GERD (gastroesophageal reflux disease)    takes Omeprazole  daily   History of colon polyps    History of kidney stones    History of shingles    Hyperlipidemia    Hypertension    takes Amlodipine ,Metoprolol ,and Lisinopril  daily   Joint pain    Joint swelling    Myocardial infarction (HCC)    several with last one being 2001(but never knew about any except 2001)   OSA (obstructive sleep apnea)    started CPAP 12/09   Peripheral vascular disease (HCC)    Peyronie's disease    s/p penile implant in 2006   Pneumonia    as a child   Skin cancer    several , one of them was melanoma (sees derm routinely)   Sleep apnea    Tingling    feet occasionally   Tinnitus    Assessment: 83 yr old male on Eliquis  5 mg BID prior to admit for atrial fibrillation. Eliquis  continued on admit 8/18 but dose reduced to 2.5 mg BID with AKI  and creatinine >1.5 and >80 yrs old.   Eliquis  2.5 mg given at 11pm on 8/18.  To change to IV heparin  for NSTEMI.  Cardiology consulted.    Thrombocytopenia, platelets count 75>65K. 119K 05/22/24. No bleeding reported. BCID with Staph aureus, so resistance. Likely contributing to low platelets. Will use aPTTs for heparin  monitoring while heparin  levels are expected to be falsely elevated due to recent Eliquis  doses.  Goal of Therapy:  Heparin  level 0.3-0.7 units/ml aPTT 66-102 seconds Monitor platelets by anticoagulation protocol: Yes   Plan:  Heparin  drip to begin at 1200 units/hr. aPTT, heparin  level and CBC ~8 hrs after drip begins. Daily aPTT and heparin  level until correlating; daily CBC. Consider holding IV heparin  if platelet count drops <50K. Monitor for signs/symptoms of bleeding. Follow up cardiology consult. Eliquis  on hold.  Genaro Zebedee Calin, RPh 06/18/2024,10:08 AM

## 2024-06-18 NOTE — Progress Notes (Signed)
 OT Cancellation Note  Patient Details Name: CADARIUS NEVARES MRN: [id number] DOB: 02-25-1941   Cancelled Treatment:    Reason Eval/Treat Not Completed: Other (comment) (Nursing asked to come back later today. Pending viral pannel, elevated trophins and confused.) Belvin Gauss K OTR/L  Acute Rehab Services  [phone] office number   Warrick Berber 06/18/2024, 11:30 AM

## 2024-06-18 NOTE — Plan of Care (Signed)

## 2024-06-18 NOTE — Progress Notes (Signed)
 Progress Note   Patient: Philip [name] FMW:[id number] DOB: [birth year] DOA: 06/17/2024     0 DOS: the patient was seen and examined on 06/18/2024   Brief hospital course: Philip [name] is a 83 years old male with past medical history of atrial fibrillation, CAD status post CABG, PAD, obstructive sleep apnea, history of AAA stenting presented to hospital after being found wandering in the parking lot out in the heat trying to find urgent care.SABRA  He was found to be very confused as per EMS.  EMS reported that he was tachycardic and hypoxic requiring 4 L of nasal cannula oxygen and was febrile with a temperature of 101 F.   Of note patient had recent skin grafting surgery on his ear 3 days ago and was taking doxycycline.Patient was noted to be stuttering  in the ED. in the ED patient was febrile with a temperature of 103 F tachycardia at 128 with blood pressure of 149/81.  Labs were notable for no leukocytosis.  Lactic acid of 1.7.  BMP showed potassium low at 2.7 with elevated creatinine at 1.8 and low sodium at 130.  Chest x-ray did not show any infiltrate.  CT head scan was negative for acute process.  Blood cultures were sent from the ED and patient was considered for admission to hospital for further evaluation and treatment.   Assessment and Plan: Sepsis due to skin infection Presented with tachycardic tachypneic and febrile, AKI.  Blood cultures positive for MSSA. possibly related to recent skin graft left ear. CT neck-mild thickening of left sternocleidomastoid and upper neck with adjacent soft tissues thickening and skin irregularity likely the source of recent graft.  ID on board, antibiotics de-escalated from vancomycin  and Zosyn  to Cefazolin . Continue to monitor vitals closely.  Acute metabolic encephalopathy- In the setting of sepsis. CT head unremarkable.  Continue delirium precautions. Fall, aspiration precautions.  NSTEMI- Demand ischemia related to sepsis, episode of  Afib. Troponin peaked to 6153. Cardiology consult appreciated. Echocardiogram pending. Follow cardiology recommendations.  Atrial flutter with RVR In the setting of sepsis. Got IV Cardizem  10mg  over night. Cardiology on board, now sinus, started on Metoprolol  12.5mg  bid. Takes eliquis  at home. Continue heparin  drip as per protocol.  Significant hypokalemia Status post IV KCl replacement. Potassium 3.0 today, will continue IV KCl replacement.  Hyponatremia Likely hypovolemic hyponatremia.  Sodium improved with IV hydration. Trend sodium.  AKI.  Baseline creatinine around 1.3.  Creatinine on presentation at 1.8.  Gentle IV hydration.   Monitor daily renal function, electrolytes. Avoid nephrotoxic drugs.  Essential hypertension Caution with antihypertensive regimen due to low blood pressures.  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 Fall, aspiration precautions. Diet:  Diet Orders (From admission, onward)     Start     Ordered   06/17/24 2143  Diet Heart Room service appropriate? Yes; Fluid consistency: Thin  Diet effective now       Question Answer Comment  Room service appropriate? Yes   Fluid consistency: Thin      06/17/24 2147           DVT prophylaxis: SCDs Start: 06/17/24 2143  Level of care: Progressive   Code Status: Full Code  Subjective: Patient is seen and examined today morning.  He is more sleepy and lethargic, unable to provide history. Overnight events noted.  Physical Exam: Vitals:   06/18/24 1300 06/18/24 1400 06/18/24 1502 06/18/24 1600  BP: (!) 124/57  116/70 134/65 113/65  Pulse: 96 98 97   Resp: (!) 21 (!) 24 20 17   Temp:   98.1 F (36.7 C)   TempSrc:   Oral   SpO2: 96% 95% 94% 96%  Weight:      Height:        General - Elderly Caucasian obese male, sleepy, lethargic HEENT - PERRLA, EOMI, atraumatic head, non tender sinuses. Lung - Clear, basal rales,  rhonchi, no wheezes. Heart - S1, S2 heard, no murmurs, rubs, trace pedal edema. Abdomen - Soft, non tender, bowel sounds good Neuro -sleepy, unable to follow commands, unable to do full neuroexam Skin - Warm and dry.  Left ear dressing noted.  Data Reviewed:      Latest Ref Rng & Units 06/18/2024    5:57 AM 06/17/2024    5:34 PM 05/22/2024   11:46 AM  CBC  WBC 4.0 - 10.5 K/uL 11.0  9.8  4.5   Hemoglobin 13.0 - 17.0 g/dL 87.6  86.3  85.4   Hematocrit 39.0 - 52.0 % 33.8  37.9  41.9   Platelets 150 - 400 K/uL 65  75  119.0       Latest Ref Rng & Units 06/18/2024    5:57 AM 06/17/2024    7:03 PM 05/22/2024   11:46 AM  BMP  Glucose 70 - 99 mg/dL 869  860  94   BUN 8 - 23 mg/dL 38  39  21   Creatinine 0.61 - 1.24 mg/dL 8.06  8.12  8.52   Sodium 135 - 145 mmol/L 133  130  138   Potassium 3.5 - 5.1 mmol/L 3.0  2.7  3.8   Chloride 98 - 111 mmol/L 100  94  97   CO2 22 - 32 mmol/L 20  23  32   Calcium  8.9 - 10.3 mg/dL 8.1  8.3  89.9    ECHOCARDIOGRAM COMPLETE Result Date: 06/18/2024    ECHOCARDIOGRAM REPORT   Patient Name:   Philip [name] Date of Exam: 06/18/2024 Medical Rec #:  [id number]      Height:       69.0 in Accession #:    [id number]     Weight:       234.3 lb Date of Birth:  1941-05-14      BSA:          2.210 m Patient Age:    83 years       BP:           104/59 mmHg Patient Gender: M              HR:           101 bpm. Exam Location:  Inpatient Procedure: 2D Echo, Color Doppler, Cardiac Doppler and Intracardiac            Opacification Agent (Both Spectral and Color Flow Doppler were            utilized during procedure). Indications:    Elevated troponin  History:        Patient has prior history of Echocardiogram examinations, most                 recent 10/07/2021. CHF, Previous Myocardial Infarction and CAD,                 Arrythmias:Atrial Fibrillation; Risk Factors:Hypertension and                 Dyslipidemia.  Sonographer:  Koleen Popper RDCS Referring Phys: [id number]  [facility]  Sonographer Comments: Technically difficult study due to poor echo windows. IMPRESSIONS  1. Left ventricular ejection fraction, by estimation, is 50%. The left ventricle has mildly decreased function. Left ventricular endocardial border not optimally defined to evaluate regional wall motion. There is moderate concentric left ventricular hypertrophy. Indeterminate diastolic filling due to E-A fusion.  2. Right ventricular systolic function was not well visualized. The right ventricular size is not well visualized. Tricuspid regurgitation signal is inadequate for assessing PA pressure.  3. Left atrial size was mild to moderately dilated.  4. The mitral valve is degenerative. No evidence of mitral valve regurgitation. Mild mitral stenosis. The mean mitral valve gradient is 4.6 mmHg with average heart rate of 99 bpm. Severe mitral annular calcification.  5. The aortic valve is tricuspid. There is mild calcification of the aortic valve. Aortic valve regurgitation is not visualized. Aortic valve sclerosis/calcification is present, without any evidence of aortic stenosis. Comparison(s): Prior images reviewed side by side. The left ventricular function is worsened. Technically poor quality study, even after administration of Definity  contrast, further challenged due to incessant ectopy. Suspect there is moderate hypokinesis of the basal inferolateral wall. FINDINGS  Left Ventricle: Left ventricular ejection fraction, by estimation, is 50%. The left ventricle has mildly decreased function. Left ventricular endocardial border not optimally defined to evaluate regional wall motion. Definity  contrast agent was given IV  to delineate the left ventricular endocardial borders. The left ventricular internal cavity size was normal in size. There is moderate concentric left ventricular hypertrophy. Indeterminate diastolic filling due to E-A fusion. Right Ventricle: The right ventricular size is not well  visualized. Right vetricular wall thickness was not well visualized. Right ventricular systolic function was not well visualized. Tricuspid regurgitation signal is inadequate for assessing PA pressure. Left Atrium: Left atrial size was mild to moderately dilated. Right Atrium: Right atrial size was normal in size. Pericardium: There is no evidence of pericardial effusion. Mitral Valve: The mitral valve is degenerative in appearance. Severe mitral annular calcification. No evidence of mitral valve regurgitation. Mild mitral valve stenosis. The mean mitral valve gradient is 4.6 mmHg with average heart rate of 99 bpm. Tricuspid Valve: The tricuspid valve is grossly normal. Tricuspid valve regurgitation is not demonstrated. Aortic Valve: The aortic valve is tricuspid. There is mild calcification of the aortic valve. Aortic valve regurgitation is not visualized. Aortic valve sclerosis/calcification is present, without any evidence of aortic stenosis. Pulmonic Valve: The pulmonic valve was not well visualized. Pulmonic valve regurgitation is trivial. No evidence of pulmonic stenosis. Aorta: The aortic root and ascending aorta are structurally normal, with no evidence of dilitation. IAS/Shunts: The interatrial septum was not well visualized.  LEFT VENTRICLE PLAX 2D LVIDd:         5.20 cm LVIDs:         4.30 cm LV PW:         1.50 cm LV IVS:        1.60 cm LVOT diam:     1.90 cm LV SV:         41 LV SV Index:   18 LVOT Area:     2.84 cm  LEFT ATRIUM             Index LA diam:        4.50 cm 2.04 cm/m LA Vol (A2C):   65.3 ml 29.55 ml/m LA Vol (A4C):   72.1 ml 32.62 ml/m LA Biplane Vol: 69.5 ml  31.45 ml/m  AORTIC VALVE LVOT Vmax:   99.70 cm/s LVOT Vmean:  63.900 cm/s LVOT VTI:    0.143 m  AORTA Ao Root diam: 3.00 cm Ao Asc diam:  3.50 cm MITRAL VALVE MV Area (PHT): 2.44 cm  SHUNTS MV Mean grad:  4.6 mmHg  Systemic VTI:  0.14 m MV Decel Time: 310 msec  Systemic Diam: 1.90 cm Philip Croitoru MD Electronically signed by  Philip Balding MD Signature Date/Time: 06/18/2024/3:49:24 PM    Final    CT SOFT TISSUE NECK W CONTRAST Result Date: 06/17/2024 CLINICAL DATA:  fever, rule out neck infection. EXAM: CT NECK WITH CONTRAST TECHNIQUE: Multidetector CT imaging of the neck was performed using the standard protocol following the bolus administration of intravenous contrast. RADIATION DOSE REDUCTION: This exam was performed according to the departmental dose-optimization program which includes automated exposure control, adjustment of the mA and/or kV according to patient size and/or use of iterative reconstruction technique. CONTRAST:  50mL OMNIPAQUE  IOHEXOL  350 MG/ML SOLN COMPARISON:  None Available. FINDINGS: Pharynx and larynx: Normal. No mass or swelling. Salivary glands: No inflammation, mass, or stone. Thyroid : Normal. Lymph nodes: None enlarged or abnormal density. Vascular: Limited evaluation due to non arterial contrast timing. Bulky calcific atherosclerosis at the carotid bifurcations. Limited intracranial: Negative. Visualized orbits: Negative. Mastoids and visualized paranasal sinuses: Clear. Skeleton: No acute or aggressive process. Upper chest: Lung apices are clear. Other: Mild thickening of the left sternocleidomastoid in the upper neck with adjacent soft tissue thickening and skin irregularity, likely the source of recent graft. Milder edema and thickening of the sternocleidomastoid in the lower neck. IMPRESSION: 1. Mild thickening of the left sternocleidomastoid in the upper neck with adjacent soft tissue thickening and skin irregularity, likely the source of recent graft. Milder edema and thickening of the sternocleidomastoid in the lower neck. 2. Otherwise, no evidence of acute abnormality. 3. Bulky calcific atherosclerosis at the carotid bifurcations. Electronically Signed   By: Gilmore GORMAN Molt M.D.   On: 06/17/2024 23:04   CT Head Wo Contrast Result Date: 06/17/2024 CLINICAL DATA:  Altered level of  consciousness EXAM: CT HEAD WITHOUT CONTRAST TECHNIQUE: Contiguous axial images were obtained from the base of the skull through the vertex without intravenous contrast. RADIATION DOSE REDUCTION: This exam was performed according to the departmental dose-optimization program which includes automated exposure control, adjustment of the mA and/or kV according to patient size and/or use of iterative reconstruction technique. COMPARISON:  10/06/2021 FINDINGS: Brain: No acute infarct or hemorrhage. Lateral ventricles and midline structures are unremarkable. No acute extra-axial fluid collections. No mass effect. Vascular: No hyperdense vessel or unexpected calcification. Skull: Normal. Negative for fracture or focal lesion. Sinuses/Orbits: No acute finding. Other: None. IMPRESSION: 1. No acute intracranial process. Electronically Signed   By: Ozell Daring M.D.   On: 06/17/2024 18:24   DG Chest Port 1 View Result Date: 06/17/2024 CLINICAL DATA:  Sepsis EXAM: PORTABLE CHEST 1 VIEW COMPARISON:  10/06/2021 FINDINGS: 2 frontal views of the chest demonstrate stable enlargement the cardiac silhouette and postsurgical changes from CABG. No acute airspace disease, effusion, or pneumothorax. There are no acute bony abnormalities. IMPRESSION: 1. Stable chest, no acute process. Electronically Signed   By: Ozell Daring M.D.   On: 06/17/2024 17:38    Family Communication: No family at bedside  Disposition: Status is: Inpatient Remains inpatient appropriate because: Sepsis, IV antibiotics, IV heparin  drip, ID and cardiology follow-up  Planned Discharge Destination: Home with Home Health     Time spent: 24  minutes  Author: Concepcion Riser, MD 06/18/2024 4:13 PM Secure chat 7am to 7pm For on call review [email or web address].

## 2024-06-18 NOTE — Progress Notes (Signed)
 PT Cancellation Note  Patient Details Name: Philip [name] MRN: [id number] DOB: 31-Jan-1941   Cancelled Treatment:    Reason Eval/Treat Not Completed: Other (comment) Discussed case with RN who recommended PT and OT attempt to return later in the day, pending viral panel, elevated troponin with cardiology consult pending, and pt drowsy.   Izetta Call, PT, DPT   Acute Rehabilitation Department Office [phone] Secure Chat Communication Preferred   Izetta JULIANNA Call 06/18/2024, 1:43 PM

## 2024-06-18 NOTE — Progress Notes (Signed)
   06/18/24 0444  Assess: MEWS Score  Temp 99.8 F (37.7 C)  BP 107/63  MAP (mmHg) 75  Pulse Rate (!) 138  ECG Heart Rate (!) 137  Resp (!) 29  Level of Consciousness Responds to Voice  SpO2 94 %  O2 Device Room Air  Assess: MEWS Score  MEWS Temp 0  MEWS Systolic 0  MEWS Pulse 3  MEWS RR 2  MEWS LOC 1  MEWS Score 6  MEWS Score Color Red  Assess: if the MEWS score is Yellow or Red  Were vital signs accurate and taken at a resting state? Yes  Does the patient meet 2 or more of the SIRS criteria? Yes  MEWS guidelines implemented  Yes, red  Treat  MEWS Interventions Considered administering scheduled or prn medications/treatments as ordered  Take Vital Signs  Increase Vital Sign Frequency  Red: Q1hr x2, continue Q4hrs until patient remains green for 12hrs  Escalate  MEWS: Escalate Red: Discuss with charge nurse and notify provider. Consider notifying RRT. If remains red for 2 hours consider need for higher level of care  Notify: Charge Nurse/RN  Name of Charge Nurse/RN Notified Fred, RN  Assess: SIRS CRITERIA  SIRS Temperature  0  SIRS Respirations  1  SIRS Pulse 1  SIRS WBC 0  SIRS Score Sum  2

## 2024-06-18 NOTE — Progress Notes (Signed)
  Echocardiogram 2D Echocardiogram has been performed.  Koleen KANDICE Popper, RDCS 06/18/2024, 2:18 PM

## 2024-06-18 NOTE — TOC CM/SW Note (Signed)
 Transition of Care [facility]) - Inpatient Brief Assessment   Patient Details  Name: Philip [name] MRN: [id number] Date of Birth: [birth year]  [facility]) CM/SW Contact:    Waddell Barnie Rama, RN Phone Number: 06/18/2024, 3:45 PM   Clinical Narrative:  From home with spouse, has PCP and insurance on file, states has no HH services in place at this time or DME at home.  States family member  (wife or son) will transport them home at [employer] and family is support system, states gets medications from [facility] on [address].  Pta self ambulatory.  Pta on eliquis  also.  There are no ICM needs identified  at this time.  Please place consult for ICM needs.  Patient gives  permission for son to speak with NCM.  Transition of Care Asessment: Insurance and Status: Insurance coverage has been reviewed Patient has primary care physician: Yes Home environment has been reviewed: home with wife Prior level of function:: indep Prior/Current Home Services: No current home services Social Drivers of Health Review: SDOH reviewed no interventions necessary Readmission risk has been reviewed: Yes Transition of care needs: no transition of care needs at this time

## 2024-06-18 NOTE — Consult Note (Addendum)
 Cardiology Consultation   Patient ID: Philip [name] MRN: [id number]; DOB: November 23, 1940  Admit date: 06/17/2024 Date of Consult: 06/18/2024  PCP:  [name], MD   [facility] Providers Cardiologist:  Dorn Lesches, MD      Patient Profile: Philip [name] is a 83 y.o. male with a hx of HTN, HLD, CAD s/p CABG (1999, [facility]), AAA s/p stenting ([facility], 2015), PAF on eliquis , CKD III who is being seen 06/18/2024 for the evaluation of Afib/flutter with RVR and sepsis at the request of Dr. Darci.  History of Present Illness: Philip [name] recently transition care from Dr. Burnard to Dr. Lesches.  He has a remote history of CABG in 1999 performed at [facility].  His last heart catheterization in 2022 showed occluded native vessels and patent grafts.  He has a chronically occluded distal left main and proximal RCA.  He had patent LIMA-LAD, patent SVG-OM, patent SVG-PDA. Afib was an incidental finding in 04/2014, not initially anticoagulated given upcoming AAA stenting planned. He never had palpitations. He was subsequently started on OAC. Heart monitor 10/2021 showed NSR with 1 episode of atrial fibrillation with RVR lasting 19 minutes.  No pauses greater than 3 seconds noted.  He was last seen by Dr. Lesches in clinic 05/2024 and doing well. He remains active with exercise and golf.   He presented to [facility] after being found by a doctor's office staff wandering repeatedly in the parking lot. He was confused with EMS, found to be hyperglycemia with BS 140s, SBP 150s, and tachycardic with HR in the 130s. He was hypoxic requiring 4L [place]. He was also febrile to 101. Code sepsis was initiated. Mentation has improved. He was admitted for further workup. Hospital course complicated by urinary retention requiring I/O cath possibly secondary to penile prosthesis. He also converted to atrial flutter with RVR in the 150s. He received 10 mg IV cardizem .   HS troponin 4819 Lactic acid 1.2 Mg  1.7 WBC 11 Hb 12.3 K 2.7 --> 3.0 Na 133 sCr 1.93 (1.87) - baseline 1.3-1.4 Sed rate 22  Blood cultures with staph aureus  Cardiology was consulted for atrial flutter with RVR. He has since converted to SR with first degree HB with PACs and PVCs. He is found sleeping with mittens, wakes to stimulation but does not answer questions. Wife at bedside helps with history. No recent illness, no chest pain. Suspect current infection related to recent ear surgery for skin cancer.    Past Medical History:  Diagnosis Date   AAA (abdominal aortic aneurysm) (HCC)    Arthritis    Atrial fibrillation (HCC)    CAD (coronary artery disease)    had a MI , s/p CABG   Cataracts, bilateral    immature   CHF (congestive heart failure) (HCC)    Dysrhythmia    paroximal a fib   GERD (gastroesophageal reflux disease)    takes Omeprazole  daily   History of colon polyps    History of kidney stones    History of shingles    Hyperlipidemia    Hypertension    takes Amlodipine ,Metoprolol ,and Lisinopril  daily   Joint pain    Joint swelling    Myocardial infarction (HCC)    several with last one being 2001(but never knew about any except 2001)   OSA (obstructive sleep apnea)    started CPAP 12/09   Peripheral vascular disease (HCC)    Peyronie's disease    s/p penile implant in 2006  Pneumonia    as a child   Skin cancer    several , one of them was melanoma (sees derm routinely)   Sleep apnea    Tingling    feet occasionally   Tinnitus     Past Surgical History:  Procedure Laterality Date   ABDOMINAL AORTAGRAM N/A 06/16/2014   Procedure: ABDOMINAL EZELLA;  Surgeon: Lonni GORMAN Blade, MD;  Location: [facility] CATH LAB;  Service: Cardiovascular;  Laterality: N/A;   ABDOMINAL AORTIC ENDOVASCULAR STENT GRAFT N/A 07/08/2014   Procedure: ABDOMINAL AORTIC ENDOVASCULAR STENT GRAFT/ GORE;  Surgeon: Lonni GORMAN Blade, MD;  Location: [facility] OR;  Service: Vascular;  Laterality: N/A;   CARDIAC  CATHETERIZATION  10/31/2013   COLONOSCOPY     CORONARY ARTERY BYPASS GRAFT  10/31/1997   x 3 vessels   LEFT HEART CATH AND CORS/GRAFTS ANGIOGRAPHY N/A 10/07/2021   Procedure: LEFT HEART CATH AND CORS/GRAFTS ANGIOGRAPHY;  Surgeon: Verlin Lonni BIRCH, MD;  Location: [facility] INVASIVE CV LAB;  Service: Cardiovascular;  Laterality: N/A;   LEFT HEART CATHETERIZATION WITH CORONARY/GRAFT ANGIOGRAM N/A 05/29/2014   Procedure: LEFT HEART CATHETERIZATION WITH EL BILE;  Surgeon: Ezra GORMAN Shuck, MD;  Location: [facility] CATH LAB;  Service: Cardiovascular;  Laterality: N/A;   PENILE PROSTHESIS IMPLANT  08/31/2005   AMS inflatable penile prosthesis   RHINOPLASTY  10/31/1973     Home Medications:  Prior to Admission medications   Medication Sig Start Date End Date Taking? Authorizing Provider  amLODipine  (NORVASC ) 10 MG tablet Take 1 tablet (10 mg total) by mouth at bedtime. 06/10/24  Yes Court Dorn PARAS, MD  Ascorbic Acid (VITAMIN C ER PO) Take 500 mg by mouth daily.   Yes [provider]  atorvastatin  (LIPITOR) 40 MG tablet Take 1 tablet (40 mg total) by mouth daily. 06/10/24  Yes Court Dorn PARAS, MD  B Complex-Biotin-FA (B-COMPLEX PO) Take 1 tablet by mouth daily.   Yes [provider]  co-enzyme Q-10 30 MG capsule Take 30 mg by mouth daily.   Yes [provider]  doxycycline (ADOXA) 100 MG tablet Take 100 mg by mouth 2 (two) times daily. 06/14/24  Yes [provider]  ELIQUIS  5 MG TABS tablet TAKE 1 TABLET TWICE DAILY 02/07/24  Yes Dick, Ernest H Jr., NP  hydrochlorothiazide  (HYDRODIURIL ) 25 MG tablet Take 1 tablet (25 mg total) by mouth daily. 07/17/23  Yes [name], Aloysius BRAVO, MD  losartan  (COZAAR ) 100 MG tablet Take 1 tablet (100 mg total) by mouth daily. 01/08/24  Yes [name], Aloysius BRAVO, MD  magnesium  oxide (MAG-OX) 400 MG tablet Take 800 mg by mouth daily.   Yes [provider]  Multiple Vitamins-Minerals (MULTIVITAMIN WITH MINERALS) tablet Take 1 tablet by  mouth daily.   Yes [provider]  Omega-3 Fatty Acids (OMEGA-3 FISH OIL ) 1200 MG CAPS Take 2 capsules (2,400 mg total) by mouth daily. Take 2 1,200 mg capsules daily 11/11/21  Yes Eckard, Tammy, RPH-CPP  polyethylene glycol (MIRALAX  / GLYCOLAX ) packet Take 17 g by mouth daily.   Yes [provider]  hydrocortisone 2.5 % cream Apply 1 application topically as needed (skin irritation). Patient not taking: No sig reported 11/14/16   [provider]    Scheduled Meds:  atorvastatin   40 mg Oral Daily   multivitamin with minerals  1 tablet Oral Daily   polyethylene glycol  17 g Oral Daily   sodium chloride  flush  3 mL Intravenous Q12H   Continuous Infusions:  sodium chloride  100 mL/hr at 06/18/24  1100   sodium chloride       ceFAZolin  (ANCEF ) IV     diltiazem  (CARDIZEM ) infusion 5 mg/hr (06/18/24 1100)   heparin  1,200 Units/hr (06/18/24 1100)   potassium chloride      PRN Meds: sodium chloride , acetaminophen  **OR** acetaminophen , HYDROcodone -acetaminophen , polyethylene glycol, sodium chloride  flush  Allergies:   No Known Allergies  Social History:   Social History   Socioeconomic History   Marital status: Married    Spouse name: Not on file   Number of children: 3   Years of education: 12   Highest education level: 12th grade  Occupational History   Occupation: retired, Research officer, political party  Tobacco Use   Smoking status: Former    Current packs/day: 0.00    Types: Cigarettes    Quit date: 1979    Years since quitting: 46.6   Smokeless tobacco: Never   Tobacco comments:    quit smoking in 1979  Vaping Use   Vaping status: Never Used  Substance and Sexual Activity   Alcohol use: Yes    Comment: occasionally - once per week   Drug use: No   Sexual activity: Not Currently  Other Topics Concern   Not on file  Social History Narrative   Lives w/ wife in a 2 story home.     Has one son and 2 stepchildren.  Retired for [employer].     Education: high school.    Social Drivers of Corporate investment banker Strain: Low Risk  (05/15/2024)   Overall Financial Resource Strain (CARDIA)    Difficulty of Paying Living Expenses: Not hard at all  Food Insecurity: No Food Insecurity (05/15/2024)   Hunger Vital Sign    Worried About Running Out of Food in the Last Year: Never true    Ran Out of Food in the Last Year: Never true  Transportation Needs: No Transportation Needs (05/15/2024)   PRAPARE - Administrator, Civil Service (Medical): No    Lack of Transportation (Non-Medical): No  Physical Activity: Sufficiently Active (05/15/2024)   Exercise Vital Sign    Days of Exercise per Week: 3 days    Minutes of Exercise per Session: 120 min  Stress: No Stress Concern Present (05/15/2024)   [employer] of Occupational Health - Occupational Stress Questionnaire    Feeling of Stress: Not at all  Social Connections: Moderately Integrated (05/15/2024)   Social Connection and Isolation Panel    Frequency of Communication with Friends and Family: More than three times a week    Frequency of Social Gatherings with Friends and Family: Twice a week    Attends Religious Services: Never    Database administrator or Organizations: Yes    Attends Engineer, structural: More than 4 times per year    Marital Status: Married  Catering manager Violence: Not At Risk (03/27/2024)   Humiliation, Afraid, Rape, and Kick questionnaire    Fear of Current or Ex-Partner: No    Emotionally Abused: No    Physically Abused: No    Sexually Abused: No    Family History:    Family History  Problem Relation Age of Onset   Prostate cancer Father 56   Heart disease Father    Skin cancer Father    Heart attack Father    Cancer Father    Deep vein thrombosis Father    Hyperlipidemia Father    Hypertension Father    Heart disease Mother    Skin cancer Mother  Heart attack Mother    Cancer Mother    Hyperlipidemia Mother    Hypertension Mother     Varicose Veins Mother    Peripheral vascular disease Mother        amputation   Sleep apnea Brother    Hyperlipidemia Brother    Hypertension Brother    Prostate cancer Brother 45   Colon cancer Other        aunt   Skin cancer Brother    Skin cancer Sister    Cancer Sister    Heart disease Sister    Diabetes Neg Hx    Stroke Neg Hx      ROS:  Please see the history of present illness.   All other ROS reviewed and negative.     Physical Exam/Data: Vitals:   06/18/24 0900 06/18/24 1000 06/18/24 1100 06/18/24 1118  BP: 105/67 (!) 107/48 114/60   Pulse: (!) 103   99  Resp: (!) 23 20 (!) 23 20  Temp:    98.1 F (36.7 C)  TempSrc:    Oral  SpO2: 94%   97%  Weight:      Height:        Intake/Output Summary (Last 24 hours) at 06/18/2024 1159 Last data filed at 06/18/2024 1100 Gross per 24 hour  Intake 1799.98 ml  Output 1150 ml  Net 649.98 ml      06/18/2024    4:44 AM 06/17/2024    7:07 PM 06/10/2024    2:27 PM  Last 3 Weights  Weight (lbs) 234 lb 5.6 oz 223 lb 235 lb  Weight (kg) 106.3 kg 101.152 kg 106.595 kg     Body mass index is 34.61 kg/m.  General:  elderly male found sleeping, mittens in plac Vascular: No carotid bruits; Distal pulses 2+ bilaterally Cardiac:  irregular rhythm, regular rate Lungs:  clear to auscultation bilaterally, no wheezing, rhonchi or rales  Abd: soft, nontender, no hepatomegaly  Ext: no edema, chronic skin changes Musculoskeletal:  No deformities, BUE and BLE strength normal and equal Skin: warm and dry  Neuro:  CNs 2-12 intact, no focal abnormalities noted Psych:  Normal affect   EKG:  The EKG was personally reviewed and demonstrates:  atrial flutter with VR 155, ST depression lateral lead Telemetry:  Telemetry was personally reviewed and demonstrates:  Atrial flutter with RVR in the 150s --> SR in the 80-90s with PACs, PVCs at approximately 0650 today  Relevant CV Studies:  Echo pending  Laboratory Data: High Sensitivity  Troponin:   Recent Labs  Lab 06/18/24 0557  TROPONINIHS 4,819*     Chemistry Recent Labs  Lab 06/17/24 1903 06/17/24 2109 06/18/24 0557  NA 130*  --  133*  K 2.7*  --  3.0*  CL 94*  --  100  CO2 23  --  20*  GLUCOSE 139*  --  130*  BUN 39*  --  38*  CREATININE 1.87*  --  1.93*  CALCIUM  8.3*  --  8.1*  MG  --  1.9 1.7  GFRNONAA 35*  --  34*  ANIONGAP 13  --  13    Recent Labs  Lab 06/17/24 1903 06/18/24 0557  PROT 6.1* 5.6*  ALBUMIN 3.3* 2.9*  AST 33 95*  ALT 25 34  ALKPHOS 42 37*  BILITOT 1.1 1.1   Lipids No results for input(s): CHOL, TRIG, HDL, LABVLDL, LDLCALC, CHOLHDL in the last 168 hours.  Hematology Recent Labs  Lab 06/17/24 1734 06/18/24 9442  WBC 9.8 11.0*  RBC 4.23 3.78*  HGB 13.6 12.3*  HCT 37.9* 33.8*  MCV 89.6 89.4  MCH 32.2 32.5  MCHC 35.9 36.4*  RDW 13.5 13.8  PLT 75* 65*   Thyroid  No results for input(s): TSH, FREET4 in the last 168 hours.  BNPNo results for input(s): BNP, PROBNP in the last 168 hours.  DDimer No results for input(s): DDIMER in the last 168 hours.  Radiology/Studies:  CT SOFT TISSUE NECK W CONTRAST Result Date: 06/17/2024 CLINICAL DATA:  fever, rule out neck infection. EXAM: CT NECK WITH CONTRAST TECHNIQUE: Multidetector CT imaging of the neck was performed using the standard protocol following the bolus administration of intravenous contrast. RADIATION DOSE REDUCTION: This exam was performed according to the departmental dose-optimization program which includes automated exposure control, adjustment of the mA and/or kV according to patient size and/or use of iterative reconstruction technique. CONTRAST:  50mL OMNIPAQUE  IOHEXOL  350 MG/ML SOLN COMPARISON:  None Available. FINDINGS: Pharynx and larynx: Normal. No mass or swelling. Salivary glands: No inflammation, mass, or stone. Thyroid : Normal. Lymph nodes: None enlarged or abnormal density. Vascular: Limited evaluation due to non arterial contrast timing.  Bulky calcific atherosclerosis at the carotid bifurcations. Limited intracranial: Negative. Visualized orbits: Negative. Mastoids and visualized paranasal sinuses: Clear. Skeleton: No acute or aggressive process. Upper chest: Lung apices are clear. Other: Mild thickening of the left sternocleidomastoid in the upper neck with adjacent soft tissue thickening and skin irregularity, likely the source of recent graft. Milder edema and thickening of the sternocleidomastoid in the lower neck. IMPRESSION: 1. Mild thickening of the left sternocleidomastoid in the upper neck with adjacent soft tissue thickening and skin irregularity, likely the source of recent graft. Milder edema and thickening of the sternocleidomastoid in the lower neck. 2. Otherwise, no evidence of acute abnormality. 3. Bulky calcific atherosclerosis at the carotid bifurcations. Electronically Signed   By: Gilmore GORMAN Molt M.D.   On: 06/17/2024 23:04   CT Head Wo Contrast Result Date: 06/17/2024 CLINICAL DATA:  Altered level of consciousness EXAM: CT HEAD WITHOUT CONTRAST TECHNIQUE: Contiguous axial images were obtained from the base of the skull through the vertex without intravenous contrast. RADIATION DOSE REDUCTION: This exam was performed according to the departmental dose-optimization program which includes automated exposure control, adjustment of the mA and/or kV according to patient size and/or use of iterative reconstruction technique. COMPARISON:  10/06/2021 FINDINGS: Brain: No acute infarct or hemorrhage. Lateral ventricles and midline structures are unremarkable. No acute extra-axial fluid collections. No mass effect. Vascular: No hyperdense vessel or unexpected calcification. Skull: Normal. Negative for fracture or focal lesion. Sinuses/Orbits: No acute finding. Other: None. IMPRESSION: 1. No acute intracranial process. Electronically Signed   By: Ozell Daring M.D.   On: 06/17/2024 18:24   DG Chest Port 1 View Result Date:  06/17/2024 CLINICAL DATA:  Sepsis EXAM: PORTABLE CHEST 1 VIEW COMPARISON:  10/06/2021 FINDINGS: 2 frontal views of the chest demonstrate stable enlargement the cardiac silhouette and postsurgical changes from CABG. No acute airspace disease, effusion, or pneumothorax. There are no acute bony abnormalities. IMPRESSION: 1. Stable chest, no acute process. Electronically Signed   By: Ozell Daring M.D.   On: 06/17/2024 17:38     Assessment and Plan:  Atrial flutter with RVR PAF Chronic anticoagulation - has not required BB - has done well on eliquis  - now on hold, heparin  gtt running - Aflutter with rates in the 150s --> SR with PACs and PVCs at approximately 0650 today after 10 mg IV  cardizem  at 0453 - suspect likely related to sepsis - BP borderline, can consider adding low dose BB if BP can tolerate - repeat echo   Elevated troponin - troponin 4819 - will trend, but in without chest pain, suspect this is likely demand ischemia in the setting of sepsis and RVR - repeat EKG now that he is in SR\   CAD s/p CABG - last heart cath in 2022 with 3/3 patent grafts - remains active, no recent complaints of chest pain, per his wife at bedside   Hypertension - holding home medications for now   Bacteremia, sepsis - will repeat echo as above - ABX per primary   Risk Assessment/Risk Scores:         CHA2DS2-VASc Score = 4   This indicates a 4.8% annual risk of stroke. The patient's score is based upon: CHF History: 0 HTN History: 1 Diabetes History: 0 Stroke History: 0 Vascular Disease History: 1 Age Score: 2 Gender Score: 0      For questions or updates, please contact [facility] Please consult [email or web address] for contact info under    Signed, Jon Nat Hails, PA  06/18/2024 11:59 AM   Agree with note by Jon Hails PA-C  Asked to see this 83 year old gentleman with A-fib/flutter with RVR.  He was previously patient Dr. Joesphine.  I recently saw him in  the office as a new patient.  His history is as outlined.  He has a history of CAD status post CABG in 1999 [place].  He is an abdominal aortic aneurysm stent grafted.  His other problems include history of PAF on Eliquis  maintaining sinus rhythm and CKD 3.  His last cath performed with Dr. Verlin in 2022 revealed occluded native vessels with patent grafts.  His LV function was normal by echo in 2022.  He had Mohs surgery on his left ear on Friday.  Because of continued bleeding he went back to see the surgeon on Monday who placed extra stitch in.  Apparently he was found walking in the parking lot confused.  EMS was called.  Patient was found to have elevated serum glucose at 140, was slightly hypertensive and tachycardic with a heart rate of 130.  He said hypoxic requiring 4 L.  Code sepsis was initiated.  He was given IV diltiazem  and converted to sinus rhythm.  He is currently fairly lethargic.  His lungs are clear.  He is in regular rhythm on exam and on telemetry.  His Eliquis  has been held and he is on IV heparin .  His white count is elevated as well.  His K is low and will need to be repleted.  He did have an elevated troponin of close to 5000 probably related to demand ischemia given his rapid ventricular response.  I do not think this is ACS.  His serum creatinine is elevated as well compared to his baseline.  Agree with potentially adding a low-dose beta-blocker for rate control.  Can cut his losartan  in half given his CKD and borderline blood pressure as well.  Will continue to follow.  Dorn DOROTHA Lesches, M.D., FACP, [facility], [facility]YN[facility] [address] [place], [place]  [address]  [phone] 06/18/2024 1:01 PM

## 2024-06-18 NOTE — Plan of Care (Signed)
  Problem: Education: Goal: Knowledge of General Education information will improve Description: Including pain rating scale, medication(s)/side effects and non-pharmacologic comfort measures Outcome: Not Progressing   Problem: Clinical Measurements: Goal: Ability to maintain clinical measurements within normal limits will improve Outcome: Progressing Goal: Will remain free from infection Outcome: Progressing Goal: Diagnostic test results will improve Outcome: Progressing Goal: Respiratory complications will improve Outcome: Progressing Goal: Cardiovascular complication will be avoided Outcome: Progressing   Problem: Nutrition: Goal: Adequate nutrition will be maintained Outcome: Progressing   Problem: Elimination: Goal: Will not experience complications related to urinary retention Outcome: Not Progressing

## 2024-06-18 NOTE — Progress Notes (Addendum)
 Notified earlier in shift of urinary retention with 900 cc in bladder. Hx of penile prosthesis. Was I/O cath x 1, placed order for serial bladder scan and notify provider parameters. UA was sent for culture. There was also concern re: inability to deflate the prosthesis. I examined and attempted to deflate, appears to be improving.   Around this time noted tachycardia, EKG ordered demonstrating Aflutter with RVR rate in the 150s. Ordered for Diltiazem  10 mg IV x 1 followed by gtt. RN was not comfortable administering on the floor (advised to contact [facility], rapid for admin if needed), and ultimately transferred to progressive before treated. Now has been started on diltiazem  as ordered. Labs including lactate, trop not drawn prior to transfer, have requested these be drawn as well.   Update:  HS trop 4819. Have switched AC order from Eliquis  to heparin  pharm to dose. Called cardiology + spoke with NP roberts, they will consult on patient and provide additional recommendations   Dorn Dawson, MD  [facility]

## 2024-06-19 ENCOUNTER — Inpatient Hospital Stay (HOSPITAL_COMMUNITY)

## 2024-06-19 DIAGNOSIS — I480 Paroxysmal atrial fibrillation: Secondary | ICD-10-CM | POA: Diagnosis not present

## 2024-06-19 DIAGNOSIS — T8149XA Infection following a procedure, other surgical site, initial encounter: Secondary | ICD-10-CM | POA: Insufficient documentation

## 2024-06-19 DIAGNOSIS — B9561 Methicillin susceptible Staphylococcus aureus infection as the cause of diseases classified elsewhere: Secondary | ICD-10-CM | POA: Diagnosis not present

## 2024-06-19 DIAGNOSIS — R7881 Bacteremia: Secondary | ICD-10-CM

## 2024-06-19 LAB — CBC
HCT: 31.6 % — ABNORMAL LOW (ref 39.0–52.0)
Hemoglobin: 11.3 g/dL — ABNORMAL LOW (ref 13.0–17.0)
MCH: 32.4 pg (ref 26.0–34.0)
MCHC: 35.8 g/dL (ref 30.0–36.0)
MCV: 90.5 fL (ref 80.0–100.0)
Platelets: 62 K/uL — ABNORMAL LOW (ref 150–400)
RBC: 3.49 MIL/uL — ABNORMAL LOW (ref 4.22–5.81)
RDW: 14.1 % (ref 11.5–15.5)
WBC: 6.7 K/uL (ref 4.0–10.5)
nRBC: 0 % (ref 0.0–0.2)

## 2024-06-19 LAB — COMPREHENSIVE METABOLIC PANEL WITH GFR
ALT: 37 U/L (ref 0–44)
AST: 107 U/L — ABNORMAL HIGH (ref 15–41)
Albumin: 2.6 g/dL — ABNORMAL LOW (ref 3.5–5.0)
Alkaline Phosphatase: 36 U/L — ABNORMAL LOW (ref 38–126)
Anion gap: 11 (ref 5–15)
BUN: 34 mg/dL — ABNORMAL HIGH (ref 8–23)
CO2: 20 mmol/L — ABNORMAL LOW (ref 22–32)
Calcium: 7.4 mg/dL — ABNORMAL LOW (ref 8.9–10.3)
Chloride: 102 mmol/L (ref 98–111)
Creatinine, Ser: 1.45 mg/dL — ABNORMAL HIGH (ref 0.61–1.24)
GFR, Estimated: 48 mL/min — ABNORMAL LOW (ref >60)
Glucose, Bld: 126 mg/dL — ABNORMAL HIGH (ref 70–99)
Potassium: 2.9 mmol/L — ABNORMAL LOW (ref 3.5–5.1)
Sodium: 133 mmol/L — ABNORMAL LOW (ref 135–145)
Total Bilirubin: 0.9 mg/dL (ref 0.0–1.2)
Total Protein: 5.2 g/dL — ABNORMAL LOW (ref 6.5–8.1)

## 2024-06-19 LAB — HEPARIN LEVEL (UNFRACTIONATED): Heparin Unfractionated: >1.1 [IU]/mL — ABNORMAL HIGH (ref 0.30–0.70)

## 2024-06-19 LAB — APTT: aPTT: 68 s — ABNORMAL HIGH (ref 24–36)

## 2024-06-19 LAB — GLUCOSE, CAPILLARY: Glucose-Capillary: 146 mg/dL — ABNORMAL HIGH (ref 70–99)

## 2024-06-19 MED ORDER — METOPROLOL TARTRATE 5 MG/5ML IV SOLN
5.0000 mg | Freq: Once | INTRAVENOUS | Status: AC
  Administered 2024-06-19: 5 mg via INTRAVENOUS

## 2024-06-19 MED ORDER — POTASSIUM CHLORIDE CRYS ER 20 MEQ PO TBCR
40.0000 meq | EXTENDED_RELEASE_TABLET | ORAL | Status: AC
  Administered 2024-06-19 (×2): 40 meq via ORAL
  Filled 2024-06-19 (×2): qty 2

## 2024-06-19 MED ORDER — MAGNESIUM SULFATE 2 GM/50ML IV SOLN
2.0000 g | Freq: Once | INTRAVENOUS | Status: AC
  Administered 2024-06-19: 2 g via INTRAVENOUS
  Filled 2024-06-19: qty 50

## 2024-06-19 MED ORDER — METOPROLOL TARTRATE 12.5 MG HALF TABLET
12.5000 mg | ORAL_TABLET | Freq: Once | ORAL | Status: AC
  Administered 2024-06-19: 12.5 mg via ORAL
  Filled 2024-06-19: qty 1

## 2024-06-19 MED ORDER — METOPROLOL TARTRATE 25 MG PO TABS
25.0000 mg | ORAL_TABLET | Freq: Two times a day (BID) | ORAL | Status: DC
  Administered 2024-06-19 – 2024-06-21 (×4): 25 mg via ORAL
  Filled 2024-06-19 (×4): qty 1

## 2024-06-19 MED ORDER — METOPROLOL TARTRATE 5 MG/5ML IV SOLN
INTRAVENOUS | Status: AC
Start: 2024-06-19 — End: 2024-06-20
  Filled 2024-06-19: qty 5

## 2024-06-19 NOTE — Progress Notes (Signed)
 Inpatient Rehab Admissions Coordinator :  Per therapy recommendations patient was screened for CIR candidacy by Heron Leavell RN MSN. Patient is not yet at a level to tolerate the intensity required to pursue a CIR admit. Noted rapid response intervention. The CIR admissions team will follow and monitor for progress and place a Rehab Consult order if felt to be appropriate. Please contact me with any questions.  Heron Leavell RN MSN Admissions Coordinator [phone]

## 2024-06-19 NOTE — Progress Notes (Signed)
 [facility]  Date of Admission:  06/17/2024     Reason for Follow Up: MSSA bacteremia  Total days of antibiotics 3         ASSESSMENT:  Philip [name] is an 83 y/o caucasian male with history of skin cancer and recent skin grafting around his neck and left ear arriving with altered mental status and found to have MSSA bacteremia.   TTE is without evidence of endocarditis although study quality was poor and will pursue TEE. Clinically improving and alert today. Most likely source of infection remains surgical site. Blood cultures for clearance of bacteremia obtained this morning and pending. Renal function slowly improving. Continue general wound care on surgical site. Remaining medical and supportive care per Internal Medicine.   PLAN:  Continue current dose of Cefazolin .  Monitor blood cultures for clearance of bacteremia.  Post-surgical wound care TEE when appropriate to check for endocarditis.  Remaining medical and supportive care per Internal Medicine.   Principal Problem:   MSSA bacteremia Active Problems:   Surgical site infection   Mixed hyperlipidemia   Essential hypertension   History of skin cancer   Atrial fibrillation (HCC)   Fever   Altered mental status   Sepsis (HCC)    atorvastatin   40 mg Oral Daily   Chlorhexidine  Gluconate Cloth  6 each Topical Daily   metoprolol  tartrate  25 mg Oral BID   multivitamin with minerals  1 tablet Oral Daily   polyethylene glycol  17 g Oral Daily   potassium chloride   40 mEq Oral Q4H   sodium chloride  flush  3 mL Intravenous Q12H    SUBJECTIVE:  Afebrile overnight with no acute events. Does not recall events leading to the hospital. Son present and informed that Philip [name] had a fever and mild confusion prior to leaving the skin care office and was instructed to go to Urgent Care. Tolerating antibiotics with no adverse side effects.   No Known Allergies   Review of Systems: Review of Systems   Constitutional:  Negative for chills, fever and weight loss.  Respiratory:  Negative for cough, shortness of breath and wheezing.   Cardiovascular:  Negative for chest pain and leg swelling.  Gastrointestinal:  Negative for abdominal pain, constipation, diarrhea, nausea and vomiting.  Skin:  Negative for rash.      OBJECTIVE: Vitals:   06/19/24 1020 06/19/24 1100 06/19/24 1121 06/19/24 1210  BP: 123/83 (!) 140/64  (!) 137/59  Pulse: (!) 113 (!) 110 88   Resp: 20 (!) 26 (!) 25 (!) 23  Temp:   99.2 F (37.3 C)   TempSrc:   Oral   SpO2: 95% 96% 95%   Weight:      Height:       Body mass index is 34.97 kg/m.  Physical Exam Constitutional:      General: He is not in acute distress.    Appearance: He is well-developed.     Comments: Seated in the chair beside the bed eating lunch; pleasant.   Cardiovascular:     Rate and Rhythm: Normal rate and regular rhythm.     Heart sounds: Normal heart sounds.  Pulmonary:     Effort: Pulmonary effort is normal.     Breath sounds: Normal breath sounds.  Skin:    General: Skin is warm and dry.  Neurological:     Mental Status: He is alert.  Psychiatric:        Mood and Affect: Mood  normal.     Lab Results Lab Results  Component Value Date   WBC 6.7 06/19/2024   HGB 11.3 (L) 06/19/2024   HCT 31.6 (L) 06/19/2024   MCV 90.5 06/19/2024   PLT 62 (L) 06/19/2024    Lab Results  Component Value Date   CREATININE 1.45 (H) 06/19/2024   BUN 34 (H) 06/19/2024   NA 133 (L) 06/19/2024   K 2.9 (L) 06/19/2024   CL 102 06/19/2024   CO2 20 (L) 06/19/2024    Lab Results  Component Value Date   ALT 37 06/19/2024   AST 107 (H) 06/19/2024   ALKPHOS 36 (L) 06/19/2024   BILITOT 0.9 06/19/2024     Microbiology: Recent Results (from the past 240 hours)  Blood Culture (routine x 2)     Status: Abnormal (Preliminary result)   Collection Time: 06/17/24  5:16 PM   Specimen: BLOOD LEFT FOREARM  Result Value Ref Range Status   Specimen  Description BLOOD LEFT FOREARM  Final   Special Requests   Final    BOTTLES DRAWN AEROBIC AND ANAEROBIC Blood Culture adequate volume   Culture  Setup Time   Final    GRAM POSITIVE COCCI IN BOTH AEROBIC AND ANAEROBIC BOTTLES CRITICAL VALUE NOTED.  VALUE IS CONSISTENT WITH PREVIOUSLY REPORTED AND CALLED VALUE. Performed at [facility], [address] [place], [place] [address]    Culture STAPHYLOCOCCUS AUREUS (A)  Final   Report Status PENDING  Incomplete  Blood Culture (routine x 2)     Status: Abnormal (Preliminary result)   Collection Time: 06/17/24  5:20 PM   Specimen: BLOOD RIGHT FOREARM  Result Value Ref Range Status   Specimen Description BLOOD RIGHT FOREARM  Final   Special Requests   Final    BOTTLES DRAWN AEROBIC AND ANAEROBIC Blood Culture adequate volume   Culture  Setup Time   Final    GRAM POSITIVE COCCI IN BOTH AEROBIC AND ANAEROBIC BOTTLES CRITICAL RESULT CALLED TO, READ BACK BY AND VERIFIED WITH: PHARMD E.MARTIN AT 9180 ON 06/18/2024 BY T.SAAD.    Culture (A)  Final    STAPHYLOCOCCUS AUREUS SUSCEPTIBILITIES TO FOLLOW Performed at [facility], [address] [place], [place] [address]    Report Status PENDING  Incomplete  Blood Culture ID Panel (Reflexed)     Status: Abnormal   Collection Time: 06/17/24  5:20 PM  Result Value Ref Range Status   Enterococcus faecalis NOT DETECTED NOT DETECTED Final   Enterococcus Faecium NOT DETECTED NOT DETECTED Final   Listeria monocytogenes NOT DETECTED NOT DETECTED Final   Staphylococcus species DETECTED (A) NOT DETECTED Final    Comment: CRITICAL RESULT CALLED TO, READ BACK BY AND VERIFIED WITH: PHARMD E.MARTIN AT 9180 ON 06/18/2024 BY T.SAAD.    Staphylococcus aureus (BCID) DETECTED (A) NOT DETECTED Final    Comment: CRITICAL RESULT CALLED TO, READ BACK BY AND VERIFIED WITH: PHARMD E.MARTIN AT 9180 ON 06/18/2024 BY T.SAAD.    Staphylococcus epidermidis NOT DETECTED NOT DETECTED Final   Staphylococcus  lugdunensis NOT DETECTED NOT DETECTED Final   Streptococcus species NOT DETECTED NOT DETECTED Final   Streptococcus agalactiae NOT DETECTED NOT DETECTED Final   Streptococcus pneumoniae NOT DETECTED NOT DETECTED Final   Streptococcus pyogenes NOT DETECTED NOT DETECTED Final   A.calcoaceticus-baumannii NOT DETECTED NOT DETECTED Final   Bacteroides fragilis NOT DETECTED NOT DETECTED Final   Enterobacterales NOT DETECTED NOT DETECTED Final   Enterobacter cloacae complex NOT DETECTED NOT DETECTED Final  Escherichia coli NOT DETECTED NOT DETECTED Final   Klebsiella aerogenes NOT DETECTED NOT DETECTED Final   Klebsiella oxytoca NOT DETECTED NOT DETECTED Final   Klebsiella pneumoniae NOT DETECTED NOT DETECTED Final   Proteus species NOT DETECTED NOT DETECTED Final   Salmonella species NOT DETECTED NOT DETECTED Final   Serratia marcescens NOT DETECTED NOT DETECTED Final   Haemophilus influenzae NOT DETECTED NOT DETECTED Final   Neisseria meningitidis NOT DETECTED NOT DETECTED Final   Pseudomonas aeruginosa NOT DETECTED NOT DETECTED Final   Stenotrophomonas maltophilia NOT DETECTED NOT DETECTED Final   Candida albicans NOT DETECTED NOT DETECTED Final   Candida auris NOT DETECTED NOT DETECTED Final   Candida glabrata NOT DETECTED NOT DETECTED Final   Candida krusei NOT DETECTED NOT DETECTED Final   Candida parapsilosis NOT DETECTED NOT DETECTED Final   Candida tropicalis NOT DETECTED NOT DETECTED Final   Cryptococcus neoformans/gattii NOT DETECTED NOT DETECTED Final   Meth resistant mecA/C and MREJ NOT DETECTED NOT DETECTED Final    Comment: Performed at [facility], [address] [place], [place] [address]  Resp panel by RT-PCR (RSV, Flu A&B, Covid) Anterior Nasal Swab     Status: None   Collection Time: 06/18/24  8:58 AM   Specimen: Anterior Nasal Swab  Result Value Ref Range Status   SARS Coronavirus 2 by RT PCR NEGATIVE NEGATIVE Final   Influenza A by PCR NEGATIVE NEGATIVE Final    Influenza B by PCR NEGATIVE NEGATIVE Final    Comment: (NOTE) The Xpert Xpress SARS-CoV-2/FLU/RSV plus assay is intended as an aid in the diagnosis of influenza from Nasopharyngeal swab specimens and should not be used as a sole basis for treatment. Nasal washings and aspirates are unacceptable for Xpert Xpress SARS-CoV-2/FLU/RSV testing.  Fact Sheet for Patients: BloggerCourse.com  Fact Sheet for Healthcare Providers: SeriousBroker.it  This test is not yet approved or cleared by the [place]  FDA and has been authorized for detection and/or diagnosis of SARS-CoV-2 by FDA under an Emergency Use Authorization (EUA). This EUA will remain in effect (meaning this test can be used) for the duration of the COVID-19 declaration under Section 564(b)(1) of the Act, 21 U.S.C. section 360bbb-3(b)(1), unless the authorization is terminated or revoked.     Resp Syncytial Virus by PCR NEGATIVE NEGATIVE Final    Comment: (NOTE) Fact Sheet for Patients: BloggerCourse.com  Fact Sheet for Healthcare Providers: SeriousBroker.it  This test is not yet approved or cleared by the [place]  FDA and has been authorized for detection and/or diagnosis of SARS-CoV-2 by FDA under an Emergency Use Authorization (EUA). This EUA will remain in effect (meaning this test can be used) for the duration of the COVID-19 declaration under Section 564(b)(1) of the Act, 21 U.S.C. section 360bbb-3(b)(1), unless the authorization is terminated or revoked.  Performed at [facility], [address] [place], [place] [address]    I have personally spent 28 minutes involved in face-to-face and non-face-to-face activities for this patient on the day of the visit. Professional time spent includes the following activities: preparing to see the patient (review of tests), performing a medically appropriate  examination, ordering medications, communicating with other health care professionals, documenting clinical information in the EMR, communicating results and counseling patient and family regarding medication and plan of care, and care coordination.    Greg Jenese Mischke, NP [facility]  06/19/2024  12:45 PM

## 2024-06-19 NOTE — Progress Notes (Signed)
 Rounding Note   Patient Name: Philip [name] Date of Encounter: 06/19/2024  [facility] Cardiologist: Dorn Lesches, MD   Subjective Patient is more communicative/less lethargic this morning.  He denies chest pain or shortness of breath.  Scheduled Meds:  atorvastatin   40 mg Oral Daily   Chlorhexidine  Gluconate Cloth  6 each Topical Daily   metoprolol  tartrate  12.5 mg Oral BID   multivitamin with minerals  1 tablet Oral Daily   polyethylene glycol  17 g Oral Daily   potassium chloride   40 mEq Oral Q4H   sodium chloride  flush  3 mL Intravenous Q12H   Continuous Infusions:   ceFAZolin  (ANCEF ) IV Stopped (06/19/24 9361)   heparin  1,200 Units/hr (06/19/24 0802)   PRN Meds: acetaminophen  **OR** acetaminophen , HYDROcodone -acetaminophen , polyethylene glycol, sodium chloride  flush   Vital Signs  Vitals:   06/19/24 0802 06/19/24 0900 06/19/24 1000 06/19/24 1020  BP: 129/73 121/76  123/83  Pulse: (!) 109 (!) 107 (!) 103 (!) 113  Resp: (!) 24 (!) 23 (!) 21 20  Temp: 98 F (36.7 C)     TempSrc: Axillary     SpO2: 94% 96% 95% 95%  Weight:      Height:        Intake/Output Summary (Last 24 hours) at 06/19/2024 1056 Last data filed at 06/19/2024 0819 Gross per 24 hour  Intake 3198.22 ml  Output 1700 ml  Net 1498.22 ml      06/19/2024   12:00 AM 06/18/2024    4:44 AM 06/17/2024    7:07 PM  Last 3 Weights  Weight (lbs) 236 lb 12.4 oz 234 lb 5.6 oz 223 lb  Weight (kg) 107.4 kg 106.3 kg 101.152 kg      Telemetry A-fib with RVR, occasional occasional aberrantly conducted beats..- Personally Reviewed  ECG  Not performed- Personally Reviewed  Physical Exam  GEN: No acute distress.   Neck: No JVD Cardiac: Irregularly irregular, no murmurs, rubs, or gallops.  Respiratory: Clear to auscultation bilaterally. GI: Soft, nontender, non-distended  MS: No edema; No deformity. Neuro:  Nonfocal  Psych: Normal affect   Labs High Sensitivity Troponin:   Recent Labs   Lab 06/18/24 0557 06/18/24 1200 06/18/24 1740 06/18/24 1922  TROPONINIHS 4,819* 6,153* 6,715* 5,648*     Chemistry Recent Labs  Lab 06/17/24 1903 06/17/24 2109 06/18/24 0557 06/19/24 0500  NA 130*  --  133* 133*  K 2.7*  --  3.0* 2.9*  CL 94*  --  100 102  CO2 23  --  20* 20*  GLUCOSE 139*  --  130* 126*  BUN 39*  --  38* 34*  CREATININE 1.87*  --  1.93* 1.45*  CALCIUM  8.3*  --  8.1* 7.4*  MG  --  1.9 1.7  --   PROT 6.1*  --  5.6* 5.2*  ALBUMIN 3.3*  --  2.9* 2.6*  AST 33  --  95* 107*  ALT 25  --  34 37  ALKPHOS 42  --  37* 36*  BILITOT 1.1  --  1.1 0.9  GFRNONAA 35*  --  34* 48*  ANIONGAP 13  --  13 11    Lipids No results for input(s): CHOL, TRIG, HDL, LABVLDL, LDLCALC, CHOLHDL in the last 168 hours.  Hematology Recent Labs  Lab 06/18/24 0557 06/18/24 1740 06/19/24 0500  WBC 11.0* 7.5 6.7  RBC 3.78* 3.84* 3.49*  HGB 12.3* 12.4* 11.3*  HCT 33.8* 34.9* 31.6*  MCV 89.4 90.9 90.5  MCH 32.5 32.3 32.4  MCHC 36.4* 35.5 35.8  RDW 13.8 14.0 14.1  PLT 65* 59* 62*   Thyroid  No results for input(s): TSH, FREET4 in the last 168 hours.  BNPNo results for input(s): BNP, PROBNP in the last 168 hours.  DDimer No results for input(s): DDIMER in the last 168 hours.   Radiology  ECHOCARDIOGRAM COMPLETE Result Date: 06/18/2024    ECHOCARDIOGRAM REPORT   Patient Name:   Philip [name] Date of Exam: 06/18/2024 Medical Rec #:  [id number]      Height:       69.0 in Accession #:    [id number]     Weight:       234.3 lb Date of Birth:  1941/10/19      BSA:          2.210 m Patient Age:    83 years       BP:           104/59 mmHg Patient Gender: M              HR:           101 bpm. Exam Location:  Inpatient Procedure: 2D Echo, Color Doppler, Cardiac Doppler and Intracardiac            Opacification Agent (Both Spectral and Color Flow Doppler were            utilized during procedure). Indications:    Elevated troponin  History:        Patient has prior history of  Echocardiogram examinations, most                 recent 10/07/2021. CHF, Previous Myocardial Infarction and CAD,                 Arrythmias:Atrial Fibrillation; Risk Factors:Hypertension and                 Dyslipidemia.  Sonographer:    Koleen Popper RDCS Referring Phys: [id number] [facility]  Sonographer Comments: Technically difficult study due to poor echo windows. IMPRESSIONS  1. Left ventricular ejection fraction, by estimation, is 50%. The left ventricle has mildly decreased function. Left ventricular endocardial border not optimally defined to evaluate regional wall motion. There is moderate concentric left ventricular hypertrophy. Indeterminate diastolic filling due to E-A fusion.  2. Right ventricular systolic function was not well visualized. The right ventricular size is not well visualized. Tricuspid regurgitation signal is inadequate for assessing PA pressure.  3. Left atrial size was mild to moderately dilated.  4. The mitral valve is degenerative. No evidence of mitral valve regurgitation. Mild mitral stenosis. The mean mitral valve gradient is 4.6 mmHg with average heart rate of 99 bpm. Severe mitral annular calcification.  5. The aortic valve is tricuspid. There is mild calcification of the aortic valve. Aortic valve regurgitation is not visualized. Aortic valve sclerosis/calcification is present, without any evidence of aortic stenosis. Comparison(s): Prior images reviewed side by side. The left ventricular function is worsened. Technically poor quality study, even after administration of Definity  contrast, further challenged due to incessant ectopy. Suspect there is moderate hypokinesis of the basal inferolateral wall. FINDINGS  Left Ventricle: Left ventricular ejection fraction, by estimation, is 50%. The left ventricle has mildly decreased function. Left ventricular endocardial border not optimally defined to evaluate regional wall motion. Definity  contrast agent was given IV  to  delineate the left ventricular endocardial borders. The left ventricular internal cavity size was normal in size. There  is moderate concentric left ventricular hypertrophy. Indeterminate diastolic filling due to E-A fusion. Right Ventricle: The right ventricular size is not well visualized. Right vetricular wall thickness was not well visualized. Right ventricular systolic function was not well visualized. Tricuspid regurgitation signal is inadequate for assessing PA pressure. Left Atrium: Left atrial size was mild to moderately dilated. Right Atrium: Right atrial size was normal in size. Pericardium: There is no evidence of pericardial effusion. Mitral Valve: The mitral valve is degenerative in appearance. Severe mitral annular calcification. No evidence of mitral valve regurgitation. Mild mitral valve stenosis. The mean mitral valve gradient is 4.6 mmHg with average heart rate of 99 bpm. Tricuspid Valve: The tricuspid valve is grossly normal. Tricuspid valve regurgitation is not demonstrated. Aortic Valve: The aortic valve is tricuspid. There is mild calcification of the aortic valve. Aortic valve regurgitation is not visualized. Aortic valve sclerosis/calcification is present, without any evidence of aortic stenosis. Pulmonic Valve: The pulmonic valve was not well visualized. Pulmonic valve regurgitation is trivial. No evidence of pulmonic stenosis. Aorta: The aortic root and ascending aorta are structurally normal, with no evidence of dilitation. IAS/Shunts: The interatrial septum was not well visualized.  LEFT VENTRICLE PLAX 2D LVIDd:         5.20 cm LVIDs:         4.30 cm LV PW:         1.50 cm LV IVS:        1.60 cm LVOT diam:     1.90 cm LV SV:         41 LV SV Index:   18 LVOT Area:     2.84 cm  LEFT ATRIUM             Index LA diam:        4.50 cm 2.04 cm/m LA Vol (A2C):   65.3 ml 29.55 ml/m LA Vol (A4C):   72.1 ml 32.62 ml/m LA Biplane Vol: 69.5 ml 31.45 ml/m  AORTIC VALVE LVOT Vmax:   99.70 cm/s  LVOT Vmean:  63.900 cm/s LVOT VTI:    0.143 m  AORTA Ao Root diam: 3.00 cm Ao Asc diam:  3.50 cm MITRAL VALVE MV Area (PHT): 2.44 cm  SHUNTS MV Mean grad:  4.6 mmHg  Systemic VTI:  0.14 m MV Decel Time: 310 msec  Systemic Diam: 1.90 cm Jerel Croitoru MD Electronically signed by Jerel Balding MD Signature Date/Time: 06/18/2024/3:49:24 PM    Final    CT SOFT TISSUE NECK W CONTRAST Result Date: 06/17/2024 CLINICAL DATA:  fever, rule out neck infection. EXAM: CT NECK WITH CONTRAST TECHNIQUE: Multidetector CT imaging of the neck was performed using the standard protocol following the bolus administration of intravenous contrast. RADIATION DOSE REDUCTION: This exam was performed according to the departmental dose-optimization program which includes automated exposure control, adjustment of the mA and/or kV according to patient size and/or use of iterative reconstruction technique. CONTRAST:  50mL OMNIPAQUE  IOHEXOL  350 MG/ML SOLN COMPARISON:  None Available. FINDINGS: Pharynx and larynx: Normal. No mass or swelling. Salivary glands: No inflammation, mass, or stone. Thyroid : Normal. Lymph nodes: None enlarged or abnormal density. Vascular: Limited evaluation due to non arterial contrast timing. Bulky calcific atherosclerosis at the carotid bifurcations. Limited intracranial: Negative. Visualized orbits: Negative. Mastoids and visualized paranasal sinuses: Clear. Skeleton: No acute or aggressive process. Upper chest: Lung apices are clear. Other: Mild thickening of the left sternocleidomastoid in the upper neck with adjacent soft tissue thickening and skin irregularity, likely the source of  recent graft. Milder edema and thickening of the sternocleidomastoid in the lower neck. IMPRESSION: 1. Mild thickening of the left sternocleidomastoid in the upper neck with adjacent soft tissue thickening and skin irregularity, likely the source of recent graft. Milder edema and thickening of the sternocleidomastoid in the lower  neck. 2. Otherwise, no evidence of acute abnormality. 3. Bulky calcific atherosclerosis at the carotid bifurcations. Electronically Signed   By: Gilmore GORMAN Molt M.D.   On: 06/17/2024 23:04   CT Head Wo Contrast Result Date: 06/17/2024 CLINICAL DATA:  Altered level of consciousness EXAM: CT HEAD WITHOUT CONTRAST TECHNIQUE: Contiguous axial images were obtained from the base of the skull through the vertex without intravenous contrast. RADIATION DOSE REDUCTION: This exam was performed according to the departmental dose-optimization program which includes automated exposure control, adjustment of the mA and/or kV according to patient size and/or use of iterative reconstruction technique. COMPARISON:  10/06/2021 FINDINGS: Brain: No acute infarct or hemorrhage. Lateral ventricles and midline structures are unremarkable. No acute extra-axial fluid collections. No mass effect. Vascular: No hyperdense vessel or unexpected calcification. Skull: Normal. Negative for fracture or focal lesion. Sinuses/Orbits: No acute finding. Other: None. IMPRESSION: 1. No acute intracranial process. Electronically Signed   By: Ozell Daring M.D.   On: 06/17/2024 18:24   DG Chest Port 1 View Result Date: 06/17/2024 CLINICAL DATA:  Sepsis EXAM: PORTABLE CHEST 1 VIEW COMPARISON:  10/06/2021 FINDINGS: 2 frontal views of the chest demonstrate stable enlargement the cardiac silhouette and postsurgical changes from CABG. No acute airspace disease, effusion, or pneumothorax. There are no acute bony abnormalities. IMPRESSION: 1. Stable chest, no acute process. Electronically Signed   By: Ozell Daring M.D.   On: 06/17/2024 17:38    Cardiac Studies  2D echocardiogram (06/18/2024)  IMPRESSIONS     1. Left ventricular ejection fraction, by estimation, is 50%. The left  ventricle has mildly decreased function. Left ventricular endocardial  border not optimally defined to evaluate regional wall motion. There is  moderate concentric  left ventricular  hypertrophy. Indeterminate diastolic filling due to E-A fusion.   2. Right ventricular systolic function was not well visualized. The right  ventricular size is not well visualized. Tricuspid regurgitation signal is  inadequate for assessing PA pressure.   3. Left atrial size was mild to moderately dilated.   4. The mitral valve is degenerative. No evidence of mitral valve  regurgitation. Mild mitral stenosis. The mean mitral valve gradient is 4.6.  With average heart rate of 99 bpm. Severe mitral annular  calcification.   5. The aortic valve is tricuspid. There is mild calcification of the  aortic valve. Aortic valve regurgitation is not visualized. Aortic valve  sclerosis/calcification is present, without any evidence of aortic  stenosis.   Comparison(s): Prior images reviewed side by side. The left ventricular  function is worsened. Technically poor quality study, even after  administration of Definity  contrast, further challenged due to incessant  ectopy. Suspect there is moderate  hypokinesis of the basal inferolateral wall   Patient Profile   Philip [name] is a 83 y.o. male with a hx of HTN, HLD, CAD s/p CABG (1999, [facility]), AAA s/p stenting ([facility], 2015), PAF on eliquis , CKD III who is being seen 06/18/2024 for the evaluation of Afib/flutter with RVR and sepsis at the request of Dr. Darci.   Assessment & Plan   1: PAF-history of PAF on Eliquis .  He was in sinus rhythm when I saw him in the office several weeks ago.  Admission he had A-fib with RVR.  His Eliquis  has been held and he is on IV heparin .  He was on a diltiazem  drip which has been discontinued.  He is on low-dose beta-blocker which we titrated.  I suspect his A-fib was initiated by his sepsis.  He may convert spontaneously once his sepsis is adequately treated.  2: Elevated troponin-suspect demand ischemia secondary to A-fib with RVR.  Troponins were peaked at approxi-5000.  Patient denies chest  pain.  3: History of CABG-last cath in 2022 showed occluded native vessels with patent grafts.  4: Essential hypertension-blood pressures have been stable.  Patient was on Norvasc  and losartan  at home which were on hold.  Will slowly titrate beta-blocker for rate control  5: Sepsis-had MSSA on IV antibiotics.  His white count has come down from 11,000 down to 6700  Patient more alert and communicative today.  Vital signs are stable.  Heart rate is still slightly elevated in A-fib.  Will titrate beta-blocker.  EF approximately 50% by 2D echo.  Herminia Jardiance nothing further to add is what I do not suspect we will continue doing the outpatient Court with IV antibiotic and   For questions or updates, please contact [facility] Please consult [email or web address] for contact info under     Signed, Dorn Court, MD  06/19/2024, 10:56 AM

## 2024-06-19 NOTE — Progress Notes (Signed)
 PT Cancellation Note  Patient Details Name: Philip [name] MRN: [id number] DOB: [birth year]   Cancelled Treatment:    Reason Eval/Treat Not Completed: Medical issues which prohibited therapy, upon my arrival pt with tremors and HR elevation (140-160s) and rapid response RN present evaluating patient. Will defer evaluation until pt medically stable.   Izetta Call, PT, DPT   Acute Rehabilitation Department Office [phone] Secure Chat Communication Preferred   Izetta JULIANNA Call 06/19/2024, 12:56 PM

## 2024-06-19 NOTE — Progress Notes (Signed)
 Patient was in chair eating lunch when we noticed his leads started reading off.  Went to room to check on him and found him standing up ripping everything off and shaking.  Patient placed back into a seated position, then into bed and placed on oxygen via [place] until O2 was able to be obtained. Then weaned off.  Rapid Nurse bought to bedside, Drue, MD called and brought to bedside as well.  IV metoprolol  given per order for elevated HR, CXR obtained. Patient eventually spiked fever. CHARM Drue, MD notified and tylenol  given.

## 2024-06-19 NOTE — Significant Event (Signed)
 Rapid Response Event Note   Reason for Call :  shaking  Initial Focused Assessment:  Patient is alert and easily responsive.  He is sitting in the chair shaking  He looks like he is having rigors.  Cool and dry to the touch.  Lung sounds clear diminished bases. Heart tones irregular and rapid.  Manual BP 160/85  HR 150-160s  RR 35-38  O2 sat 100% on [place]  Axillary temp 98.7  He has a left gaze preference but quickly crosses midline to look at the right.  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  Dr Drue came to bedside   Interventions:  Stood and pivot to bed with max assist of 4 staff members After a few minutes lying in the bed with light covers he stopped shaking. He is now warm and dry. He is able to lift his arms and legs bilaterally with no drift.  Legs are weaker than arms.  No focal weakness.   BP 145/63  HR 120-140s  RR 30s  O2 sat 100% on RA  5mg  Lopressor  IV PCXR  Plan of Care:     Event Summary:   MD Notified: Dr Drue came to bedside Call Time:  1241 Arrival Time: 1244 End Time: 1322  Elvin [place], RN

## 2024-06-19 NOTE — Progress Notes (Signed)
 PHARMACY - ANTICOAGULATION CONSULT NOTE  Pharmacy Consult for Eliquis  > IV Heparin  Indication: NSTEMI, atrial fibrillation  No Known Allergies  Patient Measurements: Height: 5' 9 (175.3 cm) Weight: 107.4 kg (236 lb 12.4 oz) IBW/kg (Calculated) : 70.7 HEPARIN  DW (KG): 92.2  Vital Signs: Temp: 98 F (36.7 C) (08/20 0802) Temp Source: Axillary (08/20 0802) BP: 129/73 (08/20 0802) Pulse Rate: 109 (08/20 0802)  Labs: Recent Labs    06/17/24 1734 06/17/24 1734 06/17/24 1903 06/18/24 0557 06/18/24 1200 06/18/24 1740 06/18/24 1922 06/19/24 0500  HGB 13.6  --   --  12.3*  --  12.4*  --  11.3*  HCT 37.9*  --   --  33.8*  --  34.9*  --  31.6*  PLT 75*  --   --  65*  --  59*  --  62*  APTT  --   --   --   --   --  67*  --  68*  LABPROT 18.4*  --   --  19.3*  --   --   --   --   INR 1.5*  --   --  1.5*  --   --   --   --   HEPARINUNFRC  --   --   --   --   --  >1.10*  --  >1.10*  CREATININE  --   --  1.87* 1.93*  --   --   --  1.45*  TROPONINIHS  --    < >  --  4,819* 6,153* 6,715* 5,648*  --    < > = values in this interval not displayed.    Estimated Creatinine Clearance: 45.8 mL/min (A) (by C-G formula based on SCr of 1.45 mg/dL (H)).   Medical History: Past Medical History:  Diagnosis Date   AAA (abdominal aortic aneurysm) (HCC)    Arthritis    Atrial fibrillation (HCC)    CAD (coronary artery disease)    had a MI , s/p CABG   Cataracts, bilateral    immature   CHF (congestive heart failure) (HCC)    Dysrhythmia    paroximal a fib   GERD (gastroesophageal reflux disease)    takes Omeprazole  daily   History of colon polyps    History of kidney stones    History of shingles    Hyperlipidemia    Hypertension    takes Amlodipine ,Metoprolol ,and Lisinopril  daily   Joint pain    Joint swelling    Myocardial infarction (HCC)    several with last one being 2001(but never knew about any except 2001)   OSA (obstructive sleep apnea)    started CPAP 12/09    Peripheral vascular disease (HCC)    Peyronie's disease    s/p penile implant in 2006   Pneumonia    as a child   Skin cancer    several , one of them was melanoma (sees derm routinely)   Sleep apnea    Tingling    feet occasionally   Tinnitus    Assessment: 83 yr old male on Eliquis  5 mg BID prior to admit for atrial fibrillation. Eliquis  continued on admit 8/18 but dose reduced to 2.5 mg BID with AKI and creatinine >1.5 and >80 yrs old.   Eliquis  2.5 mg given at 11pm on 8/18.  Now on IV heparin  for possible NSTEMI.    -aPTT= 68 and at goal on 1200 units/hr -Hg= 11.3, plt= 62 (likely due to bacteremia)  Goal of  Therapy:  Heparin  level 0.3-0.7 units/ml aPTT 66-102 seconds Monitor platelets by anticoagulation protocol: Yes   Plan:  -Continue heparin  1200 units/hr -Daily heparin  level, aPTT and CBC -Will follow procedural plans  Prentice Poisson, PharmD Clinical Pharmacist **Pharmacist phone directory can now be found on [email or web address] ([email or web address]).  Listed under [facility].

## 2024-06-19 NOTE — Evaluation (Signed)
 Occupational Therapy Evaluation Patient Details Name: Philip [name] MRN: [id number] DOB: 1940-11-08 Today's Date: 06/19/2024   History of Present Illness   83 yr old male who presented 06/17/24 due to wondering a parking lot. Pt was admitted due to fever. Blood work (+) MSSA bacteremia surgical site infection,  Pt recently had ear sx with graft from the neck. PMH: afib, CAD s/p CABG, PAD, OSA, AAA stenting, penile prothesis     Clinical Impressions PT admitted with sepsis, AMS and hypoxic . Pt currently with functional limitiations due to the deficits listed below (see OT problem list). Pt indep and goes to the gym 3 days a week. Pt enjoys playing golf at [place]. Pt at this time needs a stedy for safety with basic transfer and oob for the first time. Pt slow response to questions but with increased time able to provide an answer.  Pt will benefit from skilled OT to increase their independence and safety with adls and balance to allow discharge Patient will benefit from intensive inpatient follow-up therapy, >3 hours/day .      If plan is discharge home, recommend the following:   A lot of help with walking and/or transfers;Two people to help with bathing/dressing/bathroom     Functional Status Assessment   Patient has had a recent decline in their functional status and demonstrates the ability to make significant improvements in function in a reasonable and predictable amount of time.     Equipment Recommendations   BSC/3in1;Other (comment) (RW)     Recommendations for Other Services   Rehab consult     Precautions/Restrictions   Precautions Precautions: Fall Restrictions Weight Bearing Restrictions Per Provider Order: No     Mobility Bed Mobility Overal bed mobility: Needs Assistance Bed Mobility: Rolling, Supine to Sit Rolling: Min assist   Supine to sit: Min assist     General bed mobility comments: pt exiting on the left side fo the bed. pt needs  cues to turn head visually attend to bed rail and (A) to power up from surface. pt static sitting min to CGA (A)    Transfers Overall transfer level: Needs assistance   Transfers: Sit to/from Stand, Bed to chair/wheelchair/BSC Sit to Stand: Min assist           General transfer comment: pt powered up from slightly elevated surface (elevated due to stedy placement under the bed). pt static standing for ~1 minutes. pt up in chair Transfer via Lift Equipment: Stedy    Balance                                           ADL either performed or assessed with clinical judgement   ADL Overall ADL's : Needs assistance/impaired Eating/Feeding: Set up;Sitting   Grooming: Minimal assistance;Sitting                   Toilet Transfer: Minimal assistance;BSC/3in1 (stedy) Toilet Transfer Details (indicate cue type and reason): simulated bed to chair                 Vision Baseline Vision/History: 1 Wears glasses Vision Assessment?: Wears glasses for reading Additional Comments: pt looking toward the L but with name call able to use central vision and cross midline. once sitting eob pt with scanning occuring in all visual fields     Perception  Praxis         Pertinent Vitals/Pain Pain Assessment Pain Assessment: Faces Faces Pain Scale: Hurts little more Pain Location: pt states ive felt better' Pain Descriptors / Indicators: Other (Comment) (generalize not feeling well. reports back hurts) Pain Intervention(s): Monitored during session, Repositioned, Limited activity within patient's tolerance     Extremity/Trunk Assessment Upper Extremity Assessment Upper Extremity Assessment: Generalized weakness   Lower Extremity Assessment Lower Extremity Assessment: Generalized weakness   Cervical / Trunk Assessment Cervical / Trunk Assessment: Normal   Communication Communication Communication: No apparent difficulties   Cognition Arousal:  Alert Behavior During Therapy: Flat affect Cognition: Cognition impaired   Orientation impairments: Time, Place Awareness: Online awareness impaired, Intellectual awareness impaired Memory impairment (select all impairments): Short-term memory     OT - Cognition Comments: pt is able to pick teh correct answer with 3 choices. 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 Only after picking hospital from list of 3 is he able to geneate that he is at [facility]. pt with symmetrical smile and giggle when discussing interest in golf.                 Following commands: Intact       Cueing  General Comments      VSS HR 99-120 sitting in afib on screen. BP taken by RN SBP 140   Exercises     Shoulder Instructions      Home Living Family/patient expects to be discharged to:: Private residence Living Arrangements: Spouse/significant other Available Help at Discharge: Family;Available 24 hours/day Type of Home: House Home Access: Stairs to enter [employer] of Steps: 4 Entrance Stairs-Rails: Can reach both Home Layout: Two level;Bed/bath upstairs;Able to live on main level with bedroom/bathroom     Bathroom Shower/Tub: Walk-in shower (TUB downstaris)   Bathroom Toilet: Standard     Home Equipment: None   Additional Comments: goes to gym 3 days week lifting      Prior Functioning/Environment Prior Level of Function : Independent/Modified Independent;Driving (playing golf)                    OT Problem List: Decreased strength;Impaired balance (sitting and/or standing);Decreased activity tolerance;Decreased cognition;Decreased safety awareness;Decreased knowledge of use of DME or AE;Decreased knowledge of precautions;Cardiopulmonary status limiting activity;Obesity   OT Treatment/Interventions: Self-care/ADL training;Therapeutic exercise;Neuromuscular education;Energy conservation;DME and/or AE instruction;Manual  therapy;Therapeutic activities;Balance training;Patient/family education      OT Goals(Current goals can be found in the care plan section)   Acute Rehab OT Goals Patient Stated Goal: to return to playing golf OT Goal Formulation: With patient/family Time For Goal Achievement: 07/03/24 Potential to Achieve Goals: Good   OT Frequency:  Min 2X/week    Co-evaluation              AM-PAC OT 6 Clicks Daily Activity     Outcome Measure Help from another person eating meals?: A Little Help from another person taking care of personal grooming?: A Little Help from another person toileting, which includes using toliet, bedpan, or urinal?: A Lot Help from another person bathing (including washing, rinsing, drying)?: A Lot Help from another person to put on and taking off regular upper body clothing?: A Lot Help from another person to put on and taking off regular lower body clothing?: Total 6 Click Score: 13   End of Session Nurse Communication: Mobility status;Precautions  Activity Tolerance: Patient tolerated treatment well Patient left: in chair;with call  bell/phone within reach;with family/visitor present;with nursing/sitter in room  OT Visit Diagnosis: Unsteadiness on feet (R26.81);Muscle weakness (generalized) (M62.81)                Time: 1024-1050 OT Time Calculation (min): 26 min Charges:  OT General Charges $OT Visit: 1 Visit OT Evaluation $OT Eval Moderate Complexity: 1 Mod   Brynn, OTR/L  Acute Rehabilitation Services Office: [phone] .   [name] 06/19/2024, 1:56 PM

## 2024-06-19 NOTE — Progress Notes (Signed)
 Progress Note   Patient: Philip [name] FMW:[id number] DOB: February 23, 1941 DOA: 06/17/2024     1 DOS: the patient was seen and examined on 06/19/2024     Brief hospital course: From HPI Philip [name] is a 83 years old male with past medical history of atrial fibrillation, CAD status post CABG, PAD, obstructive sleep apnea, history of AAA stenting presented to hospital after being found wandering in the parking lot out in the heat trying to find urgent care.SABRA  He was found to be very confused as per EMS.  EMS reported that he was tachycardic and hypoxic requiring 4 L of nasal cannula oxygen and was febrile with a temperature of 101 F.   Of note patient had recent skin grafting surgery on his ear 3 days ago and was taking doxycycline.Patient was noted to be stuttering  in the ED. in the ED patient was febrile with a temperature of 103 F tachycardia at 128 with blood pressure of 149/81.  Labs were notable for no leukocytosis.  Lactic acid of 1.7.  BMP showed potassium low at 2.7 with elevated creatinine at 1.8 and low sodium at 130.  Chest x-ray did not show any infiltrate.  CT head scan was negative for acute process.  Blood cultures were sent from the ED and patient was considered for admission to hospital for further evaluation and treatment.       Assessment and Plan: Sepsis due to skin infection Presented with tachycardic tachypneic and febrile, AKI.  Blood cultures positive for MSSA. possibly related to recent skin graft left ear. CT neck-mild thickening of left sternocleidomastoid and upper neck with adjacent soft tissues thickening and skin irregularity likely the source of recent graft.  ID on board, antibiotics de-escalated from vancomycin  and Zosyn  to Cefazolin . Continue to monitor vitals closely. Infectious disease on board we appreciate input   Acute metabolic encephalopathy-improving In the setting of sepsis. CT head unremarkable.  Continue delirium precautions. Fall, aspiration  precautions.   NSTEMI- Demand ischemia related to sepsis, episode of Afib. Troponin peaked to 6153. Cardiology consult appreciated. Echocardiogram showing EF of 50% with indeterminate diastolic parameters Cardiologist on board we appreciate input  Atrial flutter with RVR In the setting of sepsis. Received IV Cardizem  which have now been transitioned to metoprolol  Continue metoprolol  Continue IV heparin  Takes eliquis  at home.   Significant hypokalemia Continue repletion and monitoring.   Hyponatremia Likely hypovolemic hyponatremia.  Sodium improved with IV hydration. Trend sodium.   AKI.  Baseline creatinine around 1.3.  Patient received gentle hydration with improvement in renal function today Monitor daily renal function, electrolytes. Avoid nephrotoxic drugs.   Essential hypertension Caution with antihypertensive regimen due to low blood pressures.   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 Lung - Clear, basal rales, rhonchi, no wheezes. Heart - S1, S2 heard, no murmurs, rubs, trace pedal edema. Abdomen - Soft, non tender, bowel sounds good Neuro -sleepy, unable to follow commands, unable to do full neuroexam Skin - Warm and dry.  Left ear dressing noted.   Data Reviewed:   Vitals:   06/19/24  1515 06/19/24 1600 06/19/24 1605 06/19/24 1700  BP:  116/73  127/64  Pulse: (!) 103 96 100 84  Resp: (!) 21 (!) 21 (!) 24 (!) 26  Temp:   97.8 F (36.6 C) 97.9 F (36.6 C)  TempSrc:   Oral Oral  SpO2: 100% 100% 100% 100%  Weight:      Height:        Data  Reviewed:  Chest x-ray reviewed that did not show any acute process    Latest Ref Rng & Units 06/19/2024    5:00 AM 06/18/2024    5:40 PM 06/18/2024    5:57 AM  CBC  WBC 4.0 - 10.5 K/uL 6.7  7.5  11.0   Hemoglobin 13.0 - 17.0 g/dL 88.6  87.5  87.6   Hematocrit 39.0 - 52.0 % 31.6  34.9  33.8   Platelets 150 - 400 K/uL 62  59  65        Latest Ref Rng & Units 06/19/2024    5:00 AM 06/18/2024    5:57 AM 06/17/2024    7:03 PM  BMP  Glucose 70 - 99 mg/dL 873  869  860   BUN 8 - 23 mg/dL 34  38  39   Creatinine 0.61 - 1.24 mg/dL 8.54  8.06  8.12   Sodium 135 - 145 mmol/L 133  133  130   Potassium 3.5 - 5.1 mmol/L 2.9  3.0  2.7   Chloride 98 - 111 mmol/L 102  100  94   CO2 22 - 32 mmol/L 20  20  23    Calcium  8.9 - 10.3 mg/dL 7.4  8.1  8.3      Family Communication: Discussed with patient's son at bedside  Disposition: Status is: Inpatient   Planned Discharge Destination:  Time spent: 45 minutes  Author: Drue Philip Potter, MD 06/19/2024 5:41 PM  For on call review [email or web address].

## 2024-06-20 DIAGNOSIS — I480 Paroxysmal atrial fibrillation: Secondary | ICD-10-CM | POA: Diagnosis not present

## 2024-06-20 DIAGNOSIS — R7989 Other specified abnormal findings of blood chemistry: Secondary | ICD-10-CM | POA: Diagnosis not present

## 2024-06-20 DIAGNOSIS — R7881 Bacteremia: Secondary | ICD-10-CM | POA: Diagnosis not present

## 2024-06-20 DIAGNOSIS — B9561 Methicillin susceptible Staphylococcus aureus infection as the cause of diseases classified elsewhere: Secondary | ICD-10-CM | POA: Diagnosis not present

## 2024-06-20 LAB — CULTURE, BLOOD (ROUTINE X 2)
Special Requests: ADEQUATE
Special Requests: ADEQUATE

## 2024-06-20 LAB — CBC
HCT: 32.2 % — ABNORMAL LOW (ref 39.0–52.0)
Hemoglobin: 11.5 g/dL — ABNORMAL LOW (ref 13.0–17.0)
MCH: 32.2 pg (ref 26.0–34.0)
MCHC: 35.7 g/dL (ref 30.0–36.0)
MCV: 90.2 fL (ref 80.0–100.0)
Platelets: 65 K/uL — ABNORMAL LOW (ref 150–400)
RBC: 3.57 MIL/uL — ABNORMAL LOW (ref 4.22–5.81)
RDW: 14 % (ref 11.5–15.5)
WBC: 8.1 K/uL (ref 4.0–10.5)
nRBC: 0 % (ref 0.0–0.2)

## 2024-06-20 LAB — COMPREHENSIVE METABOLIC PANEL WITH GFR
ALT: 26 U/L (ref 0–44)
AST: 99 U/L — ABNORMAL HIGH (ref 15–41)
Albumin: 2.5 g/dL — ABNORMAL LOW (ref 3.5–5.0)
Alkaline Phosphatase: 37 U/L — ABNORMAL LOW (ref 38–126)
Anion gap: 8 (ref 5–15)
BUN: 32 mg/dL — ABNORMAL HIGH (ref 8–23)
CO2: 24 mmol/L (ref 22–32)
Calcium: 8.3 mg/dL — ABNORMAL LOW (ref 8.9–10.3)
Chloride: 101 mmol/L (ref 98–111)
Creatinine, Ser: 1.46 mg/dL — ABNORMAL HIGH (ref 0.61–1.24)
GFR, Estimated: 47 mL/min — ABNORMAL LOW (ref >60)
Glucose, Bld: 122 mg/dL — ABNORMAL HIGH (ref 70–99)
Potassium: 3.4 mmol/L — ABNORMAL LOW (ref 3.5–5.1)
Sodium: 133 mmol/L — ABNORMAL LOW (ref 135–145)
Total Bilirubin: 0.6 mg/dL (ref 0.0–1.2)
Total Protein: 5.8 g/dL — ABNORMAL LOW (ref 6.5–8.1)

## 2024-06-20 LAB — HEPARIN LEVEL (UNFRACTIONATED): Heparin Unfractionated: >1.1 [IU]/mL — ABNORMAL HIGH (ref 0.30–0.70)

## 2024-06-20 LAB — APTT: aPTT: 72 s — ABNORMAL HIGH (ref 24–36)

## 2024-06-20 MED ORDER — APIXABAN 5 MG PO TABS
5.0000 mg | ORAL_TABLET | Freq: Two times a day (BID) | ORAL | Status: DC
  Administered 2024-06-20 – 2024-06-27 (×14): 5 mg via ORAL
  Filled 2024-06-20 (×15): qty 1

## 2024-06-20 MED ORDER — POTASSIUM CHLORIDE CRYS ER 20 MEQ PO TBCR
40.0000 meq | EXTENDED_RELEASE_TABLET | Freq: Once | ORAL | Status: AC
  Administered 2024-06-20: 40 meq via ORAL
  Filled 2024-06-20: qty 2

## 2024-06-20 MED ORDER — WHITE PETROLATUM EX OINT
TOPICAL_OINTMENT | Freq: Every day | CUTANEOUS | Status: DC
  Administered 2024-06-21 – 2024-06-23 (×3): 0.2 via TOPICAL
  Filled 2024-06-20 (×2): qty 28.35

## 2024-06-20 NOTE — Evaluation (Signed)
 Physical Therapy Evaluation Patient Details Name: Philip [name] MRN: [id number] DOB: [birth year] Today's Date: 06/20/2024  History of Present Illness  83 yr old male who presented 06/17/24 due to wondering a parking lot. Pt was admitted due to fever. Pt recently had ear sx with graft from the neck. PMH: afib, CAD s/p CABG, PAD, OSA, AAA stenting, penile prothesis  Clinical Impression  Pt admitted with/for AMS, due to bacteremia.  On eval still with decreased, but improving mentation with resultant instability and weakness with mobility needing min assist for stability most of the time.  Pt currently limited functionally due to the problems listed below.  (see problems list.)  Pt will benefit from PT to maximize function and safety to be able to get home safely with available assist.         If plan is discharge home, recommend the following:  (PRN assist from wife)   Can travel by private vehicle        Equipment Recommendations None recommended by PT  Recommendations for Other Services       Functional Status Assessment Patient has had a recent decline in their functional status and demonstrates the ability to make significant improvements in function in a reasonable and predictable amount of time.     Precautions / Restrictions Precautions Precautions: Fall Restrictions Weight Bearing Restrictions Per Provider Order: No      Mobility  Bed Mobility Overal bed mobility: Needs Assistance Bed Mobility: Rolling, Supine to Sit     Supine to sit: Min assist     General bed mobility comments: min directional cues and tatile assist to EOB    Transfers Overall transfer level: Needs assistance   Transfers: Sit to/from Stand, Bed to chair/wheelchair/BSC Sit to Stand: Min assist   Step pivot transfers: Min assist       General transfer comment: stability assist overall    Ambulation/Gait Ambulation/Gait assistance: Min assist Gait Distance (Feet): 15 Feet (to/from  bathroom) Assistive device: IV Pole Gait Pattern/deviations: Step-through pattern Gait velocity: slower Gait velocity interpretation: <1.31 ft/sec, indicative of household ambulator Pre-gait activities: >5 min standing for pericare, with deep squats multiple times to clean himself General Gait Details: mild instability likely due to decrease (though improving) mentation.  Stairs            Wheelchair Mobility     Tilt Bed    Modified Rankin (Stroke Patients Only)       Balance Overall balance assessment: Needs assistance   Sitting balance-Leahy Scale: Good     Standing balance support: No upper extremity supported, Single extremity supported, During functional activity Standing balance-Leahy Scale: Fair (to poor) Standing balance comment: using the bed foundation to stabilize, intermittent use of stationary surface during >5 min standing for peri-care post toileting.                             Pertinent Vitals/Pain Pain Assessment Pain Assessment: Faces Faces Pain Scale: Hurts a little bit Pain Descriptors / Indicators: Other (Comment) (general) Pain Intervention(s): Monitored during session    Home Living Family/patient expects to be discharged to:: Private residence Living Arrangements: Spouse/significant other Available Help at Discharge: Family;Available 24 hours/day Type of Home: House Home Access: Stairs to enter Entrance Stairs-Rails: Can reach both Entrance Stairs-Number of Steps: 4   Home Layout: Two level;Bed/bath upstairs;Able to live on main level with bedroom/bathroom Home Equipment: None Additional Comments: goes to gym 3 days week lifting  Prior Function Prior Level of Function : Independent/Modified Independent;Driving (playing golf)                     Extremity/Trunk Assessment   Upper Extremity Assessment Upper Extremity Assessment: Overall WFL for tasks assessed (general weakness)    Lower Extremity  Assessment Lower Extremity Assessment: Overall WFL for tasks assessed (general weakness)    Cervical / Trunk Assessment Cervical / Trunk Assessment: Normal  Communication   Communication Communication: No apparent difficulties    Cognition Arousal: Alert Behavior During Therapy: Flat affect                             Following commands: Intact       Cueing       General Comments General comments (skin integrity, edema, etc.): HR in the 110's with standing activity, SpO2 mid 90's    Exercises     Assessment/Plan    PT Assessment Patient needs continued PT services  PT Problem List Decreased strength;Decreased activity tolerance;Decreased balance;Decreased mobility;Decreased cognition       PT Treatment Interventions Gait training;Stair training;Functional mobility training;Therapeutic activities;Balance training;Patient/family education    PT Goals (Current goals can be found in the Care Plan section)  Acute Rehab PT Goals Patient Stated Goal: back independent Time For Goal Achievement: 06/29/24 Potential to Achieve Goals: Good    Frequency Min 2X/week     Co-evaluation               AM-PAC PT 6 Clicks Mobility  Outcome Measure Help needed turning from your back to your side while in a flat bed without using bedrails?: A Little Help needed moving from lying on your back to sitting on the side of a flat bed without using bedrails?: A Little Help needed moving to and from a bed to a chair (including a wheelchair)?: A Little Help needed standing up from a chair using your arms (e.g., wheelchair or bedside chair)?: A Little Help needed to walk in hospital room?: A Little Help needed climbing 3-5 steps with a railing? : A Little 6 Click Score: 18    End of Session   Activity Tolerance: Patient tolerated treatment well;Patient limited by fatigue Patient left: in chair;with call bell/phone within reach;with family/visitor present Nurse  Communication: Mobility status PT Visit Diagnosis: Unsteadiness on feet (R26.81);Muscle weakness (generalized) (M62.81);Difficulty in walking, not elsewhere classified (R26.2)    Time: 1208-1232 PT Time Calculation (min) (ACUTE ONLY): 24 min   Charges:   PT Evaluation $PT Eval Moderate Complexity: 1 Mod PT Treatments $Gait Training: 8-22 mins PT General Charges $$ ACUTE PT VISIT: 1 Visit         06/20/2024  [place] HERO., PT Acute Rehabilitation Services [phone]  (office)  Vinie GAILS Khian Remo 06/20/2024, 1:00 PM

## 2024-06-20 NOTE — Progress Notes (Addendum)
 Progress Note  Patient Name: Philip [name] Date of Encounter: 06/20/2024 [facility] Cardiologist: Dorn Lesches, MD   Interval Summary    Looks better today but still does not feel well. Brother at bedside.   Vital Signs Vitals:   06/19/24 2126 06/19/24 2133 06/20/24 0410 06/20/24 0734  BP:  (!) 123/96 134/84 122/65  Pulse: 92 100 97 99  Resp:  (!) 22 20 20   Temp:   99.2 F (37.3 C) 99.3 F (37.4 C)  TempSrc:   Oral Oral  SpO2: 100% 100% 100% 100%  Weight:   107.1 kg   Height:        Intake/Output Summary (Last 24 hours) at 06/20/2024 0931 Last data filed at 06/20/2024 0414 Gross per 24 hour  Intake 592.97 ml  Output 1250 ml  Net -657.03 ml      06/20/2024    4:10 AM 06/19/2024   12:00 AM 06/18/2024    4:44 AM  Last 3 Weights  Weight (lbs) 236 lb 1.8 oz 236 lb 12.4 oz 234 lb 5.6 oz  Weight (kg) 107.1 kg 107.4 kg 106.3 kg      Telemetry/ECG   Atrial fibrillation rates 90-110s - Personally Reviewed  Physical Exam  GEN: No acute distress.   Neck: No JVD Cardiac: Irreg Irreg, no murmurs, rubs, or gallops.  Respiratory: Clear to auscultation bilaterally. GI: Soft, nontender, non-distended  MS: No edema  Assessment & Plan   83 y.o. male with a hx of HTN, HLD, CAD s/p CABG (1999, [facility]), AAA s/p stenting ([facility], 2015), PAF on eliquis , CKD III who was seen 06/18/2024 for the evaluation of Afib/flutter with RVR and sepsis at the request of Dr. Darci.  Atrial fibrillation with RVR -- previously in sinus rhythm when last seen in the office, but atrial fibrillation with RVR on admission in the setting of sepsis.   -- rates have improved -- continue metoprolol  25mg  BID, will stop IV heparin  and resume Eliquid 5mg  BID given no plans for cardiac cath at this time   NSTEMI-suspect type II demand -- hsTn peaked at 6715, as above in the setting of sepsis -- no anginal symptoms prior to or during admission   HTN -- BP stable, will continue metoprolol   25mg  BID for now, can titrate as needed  -- home amlodipine  and losartan  held for now   Sepsis  MSSA -- ID following, on antibiotics -- asked for TEE, will plan for Monday  CAD s/p CABG '99 -- no anginal symptoms -- last cardiac cath 2022 with patent grafts   Informed Consent   Shared Decision Making/Informed Consent{  The risks [esophageal damage, perforation (1:10,000 risk), bleeding, pharyngeal hematoma as well as other potential complications associated with conscious sedation including aspiration, arrhythmia, respiratory failure and death], benefits (treatment guidance and diagnostic support) and alternatives of a transesophageal echocardiogram were discussed in detail with [name] and he is willing to proceed.      For questions or updates, please contact [facility] Please consult [email or web address] for contact info under       Signed, Manuelita Rummer, NP   Agree with note by Manuelita Rummer NP-C  Patient admitted with presumed sepsis with MSSA status post Mohs surgery on the left side of his neck.  He is on IV antibiotics.  He did have elevated enzymes with troponins that peaked at 6700 probably related to demand ischemia.  His vital signs are stable.  His Eliquis  was placed on hold and he was  placed on IV heparin  however this can be restarted.  He currently is in A-fib with controlled ventricular response that he was in sinus rhythm when I saw him in the office several weeks ago.  Plan TEE early next week with he is more cognitively recovered.   Dorn DOROTHA Lesches, M.D., FACP, [facility], LYNITA [facility] [address] [place], [place]  [address]  [phone] 06/20/2024 11:00 AM

## 2024-06-20 NOTE — Progress Notes (Signed)
  Progress Note   Patient: TIMOHTY [name] FMW:[id number] DOB: 12/09/1940 DOA: 06/17/2024     2 DOS: the patient was seen and examined on 06/20/2024 at 8:58AM and 4:00PM      Brief hospital course: 83 y.o. M with AF on Eliquis , CAD s/p CABG 1999, AAA s/p stenting, CKD IIIa baseline 1.3 who prseented with MSSA bacteremia from Mohs surgical wound.       Assessment and Plan: Sepsis due to staph bacteremia Acute metabolic encephalopathy Neck hematoma Induration of the left neck likely hematoma.  CT on Monday not concerning.  This is unchanged clinically from Monday per infectious disease, with his overall fever curve improving, we do not think this is an infected hematoma at this point. -Continue cefazolin  - Consult cardiology for TEE - Consult infectious disease, appreciate recommendations   Coronary artery disease Demand ischemia AAA s/p stent Hypertension Blood pressure controlled - Continue metoprolol  - Hold amlodipine , HCTZ, losartan   Thrombocytopenia Due to sepsis.  Okay to continue heparin  for now - Daily CBC - Okay to continue anticogulation for now  Atrial fibrillation with RVR - Continue Eliquis  for now  AKI on CKD IIIa Creatinine stabilized  Hyponatremia Stabilized at 133  Hypokalemia Improved - Supplement potassium  OSA on CPAP  Obesity, class I      Subjective: Patient's delirium is clearing.  He had some fever overnight.  He has no worsening of the neck pain, heart rates have improved        Physical Exam: BP 119/77 (BP Location: Left Arm)   Pulse 99   Temp 98.9 F (37.2 C) (Oral)   Resp (!) 21   Ht 5' 9 (1.753 m)   Wt 107.1 kg   SpO2 94%   BMI 34.87 kg/m   Adult male, sitting up in bed, interactive and responsive but somewhat sluggish Tachycardic, regular, no murmurs, no peripheral edema Respiratory rate normal, lungs clear without rales or wheezes Abdomen soft, no tenderness to palpation Responds to questions, makes eye  contact, psychomotor slowing is obvious, but oriented to self, wife, hospital   Data Reviewed: Discussed with infectious disease Basic metabolic panel shows mild potassium deficiency, mildly low sodium, stable at 1.4 CBC shows hemoglobin stable at 11.5, platelets stable at 65,000    Family Communication: Wife at the bedside    Disposition: Status is: Inpatient         Author: Lonni SHAUNNA Dalton, MD 06/20/2024 4:06 PM  For on call review [email or web address].

## 2024-06-20 NOTE — Progress Notes (Signed)
 [facility]    Date of Admission:  06/17/2024   Total days of antibiotics 4   ID: Philip [name] is a 83 y.o. male with  mssa bacteremia in the setting of soft tissue infection to left neck from recent skin graft surgery Principal Problem:   MSSA bacteremia Active Problems:   Mixed hyperlipidemia   Essential hypertension   History of skin cancer   Atrial fibrillation (HCC)   Fever   Altered mental status   Sepsis (HCC)   Surgical site infection    Subjective: Afebrile this am but did have isolated temp of 101.1F,   Medications:   apixaban   5 mg Oral BID   atorvastatin   40 mg Oral Daily   Chlorhexidine  Gluconate Cloth  6 each Topical Daily   metoprolol  tartrate  25 mg Oral BID   multivitamin with minerals  1 tablet Oral Daily   polyethylene glycol  17 g Oral Daily   sodium chloride  flush  3 mL Intravenous Q12H    Objective: Vital signs in last 24 hours: Temp:  [97.8 F (36.6 C)-99.3 F (37.4 C)] 98.9 F (37.2 C) (08/21 1126) Pulse Rate:  [84-106] 99 (08/21 1126) Resp:  [19-26] 21 (08/21 1126) BP: (112-137)/(64-96) 119/77 (08/21 1126) SpO2:  [94 %-100 %] 94 % (08/21 1126) Weight:  [107.1 kg] 107.1 kg (08/21 0410)  Physical Exam  Constitutional: He is oriented to person, place, and time. He appears well-developed and well-nourished. No distress.  HENT:  Mouth/Throat: Oropharynx is clear and moist. No oropharyngeal exudate. Left sided wound slightly less endurated but still asymmetry.  Cardiovascular: Normal rate, regular rhythm and normal heart sounds. Exam reveals no gallop and no friction rub.  No murmur heard.  Pulmonary/Chest: Effort normal and breath sounds normal. No respiratory distress. He has no wheezes.  Abdominal: Soft. Bowel sounds are normal. He exhibits no distension. There is no tenderness.  Lymphadenopathy:  He has no cervical adenopathy.  Neurological: He is alert and oriented to person, place, and time.  Skin: Skin  is warm and dry. No rash noted. No erythema.  Psychiatric: He has a normal mood and affect. His behavior is normal.   Lab Results Recent Labs    06/19/24 0500 06/20/24 0322  WBC 6.7 8.1  HGB 11.3* 11.5*  HCT 31.6* 32.2*  NA 133* 133*  K 2.9* 3.4*  CL 102 101  CO2 20* 24  BUN 34* 32*  CREATININE 1.45* 1.46*   Liver Panel Recent Labs    06/19/24 0500 06/20/24 0322  PROT 5.2* 5.8*  ALBUMIN 2.6* 2.5*  AST 107* 99*  ALT 37 26  ALKPHOS 36* 37*  BILITOT 0.9 0.6   Sedimentation Rate Recent Labs    06/17/24 1734  ESRSEDRATE 22*   C-Reactive Protein No results for input(s): CRP in the last 72 hours.  Microbiology: 8/20 blood cx ngtd 8/18 blood cx MSSA Studies/Results: DG Chest Port 1 View Result Date: 06/19/2024 CLINICAL DATA:  33497 Acute respiratory distress 66502 EXAM: PORTABLE CHEST 1 VIEW COMPARISON:  06/17/2024. FINDINGS: The heart size and mediastinal contours are unchanged. Prior median sternotomy and CABG. Aortic atherosclerosis. Low lung volumes. No focal consolidation, pleural effusion, or pneumothorax. No acute osseous abnormality. IMPRESSION: No significant change from the prior exam.  Stable chest. Electronically Signed   By: Harrietta Sherry M.D.   On: 06/19/2024 13:39     Assessment/Plan: If still has fevers today, then would repeat imaging on neck to see if infection  has organized that would required I x D.  For the time being continue with iv cefazolin   Cardiology is on board to plan for TEE on Monday.  Will continue to monitor follow up blood cx  Ireland Virrueta B. Luiz MD MPH [facility] [phone]   [facility] for Infectious Diseases Pager: [phone]  06/20/2024, 2:49 PM

## 2024-06-20 NOTE — Consult Note (Addendum)
 WOC Nurse Consult Note: patient with history of squamous cell CA of neck with reported Mohs procedure by dermatology  Reason for Consult: neck wound  Wound type: full thickness L neck and L ear r/t squamous cell CA and Mohs procedure  Pressure Injury POA: NA  Measurement: per nursing flowsheet  Wound bed: neck red moist; l ear dark hemorrhagic tissue and red moist  Drainage (amount, consistency, odor) per nursing flowsheet  Periwound: Dressing procedure/placement/frequency:  Cleanse L ear  wound with NS, apply vaseline to wound beds daily, may leave open to air or cover with silicone foam or Telfa nonstick dressing and tape if desired/oozing.   Cleanse L neck incision with NS, apply silver hydrofiber Soila [phone]) cut to fit wound bed daily and secure with silicone foam.    POC discussed with bedside nurse. Patient should continue follow-up with dermatologist as outpatient.   WOC team will not follow. Re-consult if further needs arise.   Thank you,    Powell Bar MSN, RN-BC, [employer]

## 2024-06-20 NOTE — Progress Notes (Signed)
  Inpatient Rehab Admissions Coordinator :  Per therapy recommendations, patient was screened for CIR candidacy by Ottie Glazier RN MSN.  At this time patient appears to be a potential candidate for CIR. I will place a rehab consult per protocol for full assessment. Please call me with any questions.  Ottie Glazier RN MSN Admissions Coordinator [phone]

## 2024-06-21 DIAGNOSIS — R7881 Bacteremia: Secondary | ICD-10-CM | POA: Diagnosis not present

## 2024-06-21 DIAGNOSIS — Z951 Presence of aortocoronary bypass graft: Secondary | ICD-10-CM | POA: Diagnosis not present

## 2024-06-21 DIAGNOSIS — R7989 Other specified abnormal findings of blood chemistry: Secondary | ICD-10-CM | POA: Diagnosis not present

## 2024-06-21 DIAGNOSIS — I480 Paroxysmal atrial fibrillation: Secondary | ICD-10-CM | POA: Diagnosis not present

## 2024-06-21 DIAGNOSIS — T86822 Skin graft (allograft) (autograft) infection: Secondary | ICD-10-CM

## 2024-06-21 DIAGNOSIS — B9561 Methicillin susceptible Staphylococcus aureus infection as the cause of diseases classified elsewhere: Secondary | ICD-10-CM | POA: Diagnosis not present

## 2024-06-21 LAB — CBC
HCT: 31.2 % — ABNORMAL LOW (ref 39.0–52.0)
Hemoglobin: 10.9 g/dL — ABNORMAL LOW (ref 13.0–17.0)
MCH: 31.6 pg (ref 26.0–34.0)
MCHC: 34.9 g/dL (ref 30.0–36.0)
MCV: 90.4 fL (ref 80.0–100.0)
Platelets: 73 K/uL — ABNORMAL LOW (ref 150–400)
RBC: 3.45 MIL/uL — ABNORMAL LOW (ref 4.22–5.81)
RDW: 14.3 % (ref 11.5–15.5)
WBC: 7.6 K/uL (ref 4.0–10.5)
nRBC: 0 % (ref 0.0–0.2)

## 2024-06-21 LAB — COMPREHENSIVE METABOLIC PANEL WITH GFR
ALT: 15 U/L (ref 0–44)
AST: 69 U/L — ABNORMAL HIGH (ref 15–41)
Albumin: 2.4 g/dL — ABNORMAL LOW (ref 3.5–5.0)
Alkaline Phosphatase: 39 U/L (ref 38–126)
Anion gap: 12 (ref 5–15)
BUN: 26 mg/dL — ABNORMAL HIGH (ref 8–23)
CO2: 23 mmol/L (ref 22–32)
Calcium: 8.3 mg/dL — ABNORMAL LOW (ref 8.9–10.3)
Chloride: 104 mmol/L (ref 98–111)
Creatinine, Ser: 1.27 mg/dL — ABNORMAL HIGH (ref 0.61–1.24)
GFR, Estimated: 56 mL/min — ABNORMAL LOW (ref >60)
Glucose, Bld: 117 mg/dL — ABNORMAL HIGH (ref 70–99)
Potassium: 3.4 mmol/L — ABNORMAL LOW (ref 3.5–5.1)
Sodium: 139 mmol/L (ref 135–145)
Total Bilirubin: 0.9 mg/dL (ref 0.0–1.2)
Total Protein: 5.4 g/dL — ABNORMAL LOW (ref 6.5–8.1)

## 2024-06-21 MED ORDER — POTASSIUM CHLORIDE CRYS ER 20 MEQ PO TBCR
40.0000 meq | EXTENDED_RELEASE_TABLET | Freq: Once | ORAL | Status: AC
Start: 2024-06-21 — End: 2024-06-21
  Administered 2024-06-21: 40 meq via ORAL
  Filled 2024-06-21: qty 2

## 2024-06-21 MED ORDER — METOPROLOL TARTRATE 50 MG PO TABS
50.0000 mg | ORAL_TABLET | Freq: Two times a day (BID) | ORAL | Status: DC
  Administered 2024-06-21 – 2024-06-27 (×11): 50 mg via ORAL
  Filled 2024-06-21 (×12): qty 1

## 2024-06-21 MED ORDER — POTASSIUM CHLORIDE CRYS ER 20 MEQ PO TBCR
40.0000 meq | EXTENDED_RELEASE_TABLET | Freq: Once | ORAL | Status: AC
  Administered 2024-06-21: 40 meq via ORAL
  Filled 2024-06-21: qty 2

## 2024-06-21 NOTE — Progress Notes (Signed)
    [facility]    Date of Admission:  06/17/2024   Total days of antibiotics  ID: Philip [name] is a 83 y.o. male with   Principal Problem:   MSSA bacteremia Active Problems:   Mixed hyperlipidemia   Essential hypertension   History of skin cancer   Atrial fibrillation (HCC)   Fever   Altered mental status   Sepsis (HCC)   Surgical site infection    Subjective: Afebrile. He feels like there is less swelling to left side of neck  Medications:   apixaban   5 mg Oral BID   atorvastatin   40 mg Oral Daily   Chlorhexidine  Gluconate Cloth  6 each Topical Daily   metoprolol  tartrate  50 mg Oral BID   multivitamin with minerals  1 tablet Oral Daily   polyethylene glycol  17 g Oral Daily   sodium chloride  flush  3 mL Intravenous Q12H   white petrolatum    Topical Daily    Objective: Vital signs in last 24 hours: Temp:  [97.9 F (36.6 C)-98.3 F (36.8 C)] 98.3 F (36.8 C) (08/22 1109) Pulse Rate:  [84-102] 84 (08/22 1109) Resp:  [16-23] 20 (08/22 1109) BP: (110-139)/(72-94) 139/80 (08/22 1109) SpO2:  [94 %-96 %] 95 % (08/22 1109) Weight:  [106.5 kg] 106.5 kg (08/22 0415) Physical Exam  Constitutional: He is oriented to person, place, and time. He appears well-developed and well-nourished. No distress.  HENT:  left side of neck covered. Less erythema Mouth/Throat: Oropharynx is clear and moist. No oropharyngeal exudate.  Cardiovascular: Normal rate, regular rhythm and normal heart sounds. Exam reveals no gallop and no friction rub.  No murmur heard.  Pulmonary/Chest: Effort normal and breath sounds normal. No respiratory distress. He has no wheezes.  Abdominal: Soft. Bowel sounds are normal. He exhibits no distension. There is no tenderness.  Lymphadenopathy:  He has no cervical adenopathy.  Neurological: He is alert and oriented to person, place, and time.  Skin: Skin is warm and dry. No rash noted. No erythema.  Psychiatric: He has a normal mood  and affect. His behavior is normal.   Lab Results Recent Labs    06/20/24 0322 06/21/24 0237  WBC 8.1 7.6  HGB 11.5* 10.9*  HCT 32.2* 31.2*  NA 133* 139  K 3.4* 3.4*  CL 101 104  CO2 24 23  BUN 32* 26*  CREATININE 1.46* 1.27*   Liver Panel Recent Labs    06/20/24 0322 06/21/24 0237  PROT 5.8* 5.4*  ALBUMIN 2.5* 2.4*  AST 99* 69*  ALT 26 15  ALKPHOS 37* 39  BILITOT 0.6 0.9   Sedimentation Rate No results for input(s): ESRSEDRATE in the last 72 hours. C-Reactive Protein No results for input(s): CRP in the last 72 hours.  Microbiology: 8/20 blood cx NGTD 8/18 blood cx MSSA Studies/Results: No results found.   Assessment/Plan: Complicated MSSA bacteremia associated with soft skin tissue infection, site of recent skin graft to left neck = continue on cefazolin  2gm IV Q 8hr. Appears improving. Blood cx are ngtd at 48hr.  Plan for TEE on Monday  Long term medication = continue to check cr  We will see back on Monday.   [facility] for Infectious Diseases Pager: [phone]  06/21/2024, 2:43 PM

## 2024-06-21 NOTE — Progress Notes (Signed)
  Inpatient Rehabilitation Admissions Coordinator   Met with patient and his brother at bedside for rehab assessment.  We will need to follow up with his progress with mobility over the weekend to determine which rehab venue will be needed. Noted plans for TEE nest week. We will follow up on Monday. Please call me with any questions.   Heron Leavell, RN, MSN Rehab Admissions Coordinator [phone]

## 2024-06-21 NOTE — Progress Notes (Addendum)
 Physical Therapy Treatment Patient Details Name: Philip [name] MRN: [id number] DOB: 02/05/1941 Today's Date: 06/21/2024   History of Present Illness 83 yr old male who presented 06/17/24 due to wondering a parking lot. Pt was admitted due to fever. Pt recently had ear sx with graft from the neck. PMH: afib, CAD s/p CABG, PAD, OSA, AAA stenting, penile prothesis    PT Comments  Pt is slowly improving in mentation, still encephalopathic and apathetic.  Emphasis on standing exercises from strengthening and gait training for stamina and stability    If plan is discharge home, recommend the following:  (PRN assist from wife)   Can travel by private vehicle        Equipment Recommendations  None recommended by PT    Recommendations for Other Services       Precautions / Restrictions Precautions Precautions: Fall Restrictions Weight Bearing Restrictions Per Provider Order: No     Mobility  Bed Mobility                    Transfers Overall transfer level: Needs assistance   Transfers: Sit to/from Stand, Bed to chair/wheelchair/BSC Sit to Stand: Min assist, Contact guard assist (cues for safety and hand placement)           General transfer comment: stability assist overall    Ambulation/Gait Ambulation/Gait assistance: Min assist Gait Distance (Feet): 160 Feet (x2 with rest propped on wall.) Assistive device: IV Pole Gait Pattern/deviations: Step-through pattern Gait velocity: slower     General Gait Details: mild instability likely due to decrease (though improving) mentation.   Stairs             Wheelchair Mobility     Tilt Bed    Modified Rankin (Stroke Patients Only)       Balance Overall balance assessment: Needs assistance   Sitting balance-Leahy Scale: Good     Standing balance support: No upper extremity supported, Single extremity supported, During functional activity Standing balance-Leahy Scale: Fair (to poor) Standing  balance comment: using the bed foundation to stabilize, intermittent use of stationary surface during >5 min standing for peri-care post toileting.                            Communication Communication Communication: No apparent difficulties  Cognition Arousal: Alert Behavior During Therapy: Flat affect                             Following commands: Intact      Cueing    Exercises General Exercises - Lower Extremity Hip ABduction/ADduction: AROM, Both, 10 reps, Standing Hip Flexion/Marching: AROM, Both, 10 reps, Standing Toe Raises: AROM, Both, 15 reps, Standing Heel Raises: AROM, Both, 15 reps, Standing Mini-Sqauts: AROM, 10 reps, Standing    General Comments General comments (skin integrity, edema, etc.): HR 90s with one run of increase to 101 during session. pt otherwise with stable HR      Pertinent Vitals/Pain Pain Assessment Pain Assessment: Faces Faces Pain Scale: Hurts a little bit Pain Descriptors / Indicators: Other (Comment) (gripey stomach  bowel pain) Pain Intervention(s): Monitored during session    Home Living Family/patient expects to be discharged to:: Private residence Living Arrangements: Spouse/significant other Available Help at Discharge: Family;Available 24 hours/day Type of Home: House Home Access: Stairs to enter Entrance Stairs-Rails: Can reach both Entrance Stairs-Number of Steps: 4   Home Layout: Two  level;Bed/bath upstairs;Able to live on main level with bedroom/bathroom Home Equipment: None Additional Comments: goes to gym 3 days week lifting    Prior Function            PT Goals (current goals can now be found in the care plan section) Acute Rehab PT Goals Patient Stated Goal: back independent Time For Goal Achievement: 06/29/24 Potential to Achieve Goals: Good    Frequency    Min 2X/week      PT Plan      Co-evaluation              AM-PAC PT 6 Clicks Mobility   Outcome Measure  Help  needed turning from your back to your side while in a flat bed without using bedrails?: A Little Help needed moving from lying on your back to sitting on the side of a flat bed without using bedrails?: A Little Help needed moving to and from a bed to a chair (including a wheelchair)?: A Little Help needed standing up from a chair using your arms (e.g., wheelchair or bedside chair)?: A Little Help needed to walk in hospital room?: A Little Help needed climbing 3-5 steps with a railing? : A Little 6 Click Score: 18    End of Session   Activity Tolerance: Patient tolerated treatment well;Patient limited by fatigue Patient left: in chair;with call bell/phone within reach;with family/visitor present Nurse Communication: Mobility status PT Visit Diagnosis: Unsteadiness on feet (R26.81);Muscle weakness (generalized) (M62.81);Difficulty in walking, not elsewhere classified (R26.2)     Time: 8354-8275 PT Time Calculation (min) (ACUTE ONLY): 39 min  Charges:    $Gait Training: 8-22 mins $Therapeutic Exercise: 8-22 mins $Therapeutic Activity: 8-22 mins PT General Charges $$ ACUTE PT VISIT: 1 Visit                     06/21/2024  [place] HERO., PT Acute Rehabilitation Services [phone]  (office)   Vinie GAILS Praise Dolecki 06/21/2024, 5:54 PM

## 2024-06-21 NOTE — Progress Notes (Signed)
 Occupational Therapy Treatment Patient Details Name: Philip [name] MRN: [id number] DOB: Feb 10, 1941 Today's Date: 06/21/2024   History of present illness 83 yr old male who presented 06/17/24 due to wondering a parking lot. Pt was admitted due to fever. Pt recently had ear sx with graft from the neck. PMH: afib, CAD s/p CABG, PAD, OSA, AAA stenting, penile prothesis   OT comments  Pt demonstrates basic transfer, sink level grooming and toilet transfer during session. Pt requires min (A) for balance even with RW due to posterior bias. Pt demonstrates decreased cognition at this time and flat affect. Pt increased briskness to responses compared to prior session. Recommendation for Patient will benefit from intensive inpatient follow-up therapy, >3 hours/day       If plan is discharge home, recommend the following:  A lot of help with walking and/or transfers;Two people to help with bathing/dressing/bathroom   Equipment Recommendations  BSC/3in1;Other (comment)    Recommendations for Other Services Rehab consult    Precautions / Restrictions Precautions Precautions: Fall Restrictions Weight Bearing Restrictions Per Provider Order: No       Mobility Bed Mobility               General bed mobility comments: oob on arrival    Transfers Overall transfer level: Needs assistance   Transfers: Sit to/from Stand Sit to Stand: Contact guard assist           General transfer comment: cues for hand placement and not to pull on RW     Balance Overall balance assessment: Needs assistance         Standing balance support: Single extremity supported, During functional activity, Reliant on assistive device for balance Standing balance-Leahy Scale: Fair Standing balance comment: posterior bias and likely to fall posteriorly based on this session                           ADL either performed or assessed with clinical judgement   ADL Overall ADL's : Needs  assistance/impaired     Grooming: Minimal assistance;Standing Grooming Details (indicate cue type and reason): pt with posterior bias. pt with cues with simple tactile input to weight shift anterior for task. pt able to open all containers and sequence properly                 Toilet Transfer: Minimal assistance;Standard walker;Regular Toilet;Grab bars Toilet Transfer Details (indicate cue type and reason): pt requires cues for hand placement and safety. pt requires use of grab bars Toileting- Clothing Manipulation and Hygiene: Moderate assistance Toileting - Clothing Manipulation Details (indicate cue type and reason): pt with loose stool but very small amount     Functional mobility during ADLs: Minimal assistance;Rolling walker (2 wheels)      Extremity/Trunk Assessment Upper Extremity Assessment Upper Extremity Assessment: Overall WFL for tasks assessed   Lower Extremity Assessment Lower Extremity Assessment: Overall WFL for tasks assessed   Cervical / Trunk Assessment Cervical / Trunk Assessment: Normal    Vision       Perception     Praxis     Communication Communication Communication: No apparent difficulties   Cognition Arousal: Alert Behavior During Therapy: Flat affect Cognition: Cognition impaired             OT - Cognition Comments: pt with increased brisk responses compared to prior sessions. pt still looking at times to family in the room to get responses  Following commands: Intact        Cueing      Exercises      Shoulder Instructions       General Comments HR 90s with one run of increase to 101 during session. pt otherwise with stable HR    Pertinent Vitals/ Pain       Pain Assessment Pain Assessment: No/denies pain  Home Living Family/patient expects to be discharged to:: Private residence Living Arrangements: Spouse/significant other Available Help at Discharge: Family;Available 24 hours/day Type of  Home: House Home Access: Stairs to enter [employer] of Steps: 4 Entrance Stairs-Rails: Can reach both Home Layout: Two level;Bed/bath upstairs;Able to live on main level with bedroom/bathroom     Bathroom Shower/Tub: Walk-in shower (TUB downstaris)   Bathroom Toilet: Standard     Home Equipment: None   Additional Comments: goes to gym 3 days week lifting      Prior Functioning/Environment              Frequency  Min 2X/week        Progress Toward Goals  OT Goals(current goals can now be found in the care plan section)  Progress towards OT goals: Progressing toward goals  Acute Rehab OT Goals Patient Stated Goal: to be able to play golf again OT Goal Formulation: With patient/family Time For Goal Achievement: 07/03/24 Potential to Achieve Goals: Good ADL Goals Pt Will Perform Eating: with set-up;sitting Pt Will Perform Grooming: with set-up;sitting Pt Will Perform Upper Body Bathing: with contact guard assist;sitting Pt Will Transfer to Toilet: with min assist;stand pivot transfer;bedside commode Additional ADL Goal #1: pt will demonstrate bed mobility supervision as precursor to adls.  Plan      Co-evaluation                 AM-PAC OT 6 Clicks Daily Activity     Outcome Measure   Help from another person eating meals?: A Little Help from another person taking care of personal grooming?: A Little Help from another person toileting, which includes using toliet, bedpan, or urinal?: A Lot Help from another person bathing (including washing, rinsing, drying)?: A Lot Help from another person to put on and taking off regular upper body clothing?: A Little Help from another person to put on and taking off regular lower body clothing?: Total 6 Click Score: 14    End of Session Equipment Utilized During Treatment: Rolling walker (2 wheels);Gait belt  OT Visit Diagnosis: Unsteadiness on feet (R26.81);Muscle weakness (generalized) (M62.81)    Activity Tolerance Patient tolerated treatment well   Patient Left in chair;with call bell/phone within reach;with family/visitor present   Nurse Communication Mobility status        Time: 1452 (1352)-1519 OT Time Calculation (min): 27 min  Charges: OT General Charges $OT Visit: 1 Visit OT Treatments $Self Care/Home Management : 23-37 mins   Brynn, OTR/L  Acute Rehabilitation Services Office: [phone] .   [name] 06/21/2024, 5:53 PM

## 2024-06-21 NOTE — Progress Notes (Addendum)
  Progress Note  Patient Name: Philip [name] Date of Encounter: 06/21/2024 Philip [name] Cardiologist: Philip Lesches, MD    Interval Summary   Patient's cognition is improving. Was able to sit out of bed yesterday and can walk to the bathroom. Converted to NSR.   Vital Signs Vitals:   06/21/24 0415 06/21/24 0416 06/21/24 0739 06/21/24 0800  BP: 129/82 129/82 132/83 134/76  Pulse: 98  98   Resp: 20 20 20 17   Temp: 98.3 F (36.8 C)  97.9 F (36.6 C)   TempSrc: Oral  Axillary   SpO2: 96%  96%   Weight: 106.5 kg     Height:        Intake/Output Summary (Last 24 hours) at 06/21/2024 0931 Last data filed at 06/21/2024 9474 Gross per 24 hour  Intake 894 ml  Output 1350 ml  Net -456 ml      06/21/2024    4:15 AM 06/20/2024    4:10 AM 06/19/2024   12:00 AM  Last 3 Weights  Weight (lbs) 234 lb 12.6 oz 236 lb 1.8 oz 236 lb 12.4 oz  Weight (kg) 106.5 kg 107.1 kg 107.4 kg      Telemetry/ECG  NSR with PVCs  - Personally Reviewed  Physical exam per MD   Assessment & Plan   Atrial Fibrillation  - Patient found to be in Afib with rVR on admission in the setting of sepsis  - Now maintaining NSR with occasionally PVCs  - K 3.4- ordered supplementation  - Increase metoprolol  tartrate to 50 mg BID  - Continue eliquis  5 mg BID- this is the correct dose based on weight, renal function   NSTEMI- suspect type II demand  - Trop peaked at 6715  - Echo this admission with EF 50% - Suspect demand ischemia in the setting of sepsis. No anginal symptoms   - No plans for cath at this time   HTN  - BP in the 130s/80s  - Increase metoprolol  as above   Sepsis  MSSA  - TEE planned for Monday - see progress note 8/21 for consent   CAD s/p CABG  - Previously had CABG in 1999 - Most recent cath in 2022 showed patent LIMA-LAD, SVG-OM, SVG-PDA - Not on ASA as patient is on eliquis   - Continue metoprolol  tartrate, lipitor 40 mg daily    For questions or updates, please contact  [facility] [name] Please consult [email or web address] for contact info under       Signed, Philip FABIENE [name], PA-C   Agree with note by Philip [name] PA-C   Patient looks more alert today.  Has converted to sinus rhythm.  Currently on Eliquis .  Suspect elevated troponins related to demand ischemia.  On antibiotics for MSSA.  Plan for TEE on Monday.  Philip DOROTHA [name], M.D., FACP, [facility], FAHA, [facility]  [address] [place], [place]  [address]  [phone] 06/21/2024 10:15 AM

## 2024-06-21 NOTE — Plan of Care (Signed)
   Problem: Education: Goal: Knowledge of General Education information will improve Description Including pain rating scale, medication(s)/side effects and non-pharmacologic comfort measures Outcome: Progressing   Problem: Health Behavior/Discharge Planning: Goal: Ability to manage health-related needs will improve Outcome: Progressing

## 2024-06-21 NOTE — Progress Notes (Signed)
  Progress Note   Patient: Philip [name] FMW:[id number] DOB: May 06, 1941 DOA: 06/17/2024     3 DOS: the patient was seen and examined on 06/21/2024        Brief hospital course: 83 y.o. M with AF on Eliquis , CAD s/p CABG 1999, AAA s/p stenting, CKD IIIa baseline 1.3 who prseented with MSSA bacteremia from Mohs surgical wound.       Assessment and Plan: Sepsis due to MSSA bacteremia Acute metabolic encephalopathy Neck hematoma - Continue cefazolin  - Consult ID -Needs TEE  Acute metabolic encephalopathy due to sepsis Resolving - Delirium precautions  Thrombocytopenia Due to sepsis, slightly up today - Daily CBC - Okay to continue anticoagulation as long as this is trending up  Coronary artery disease Demand ischemia AAA with stenting years ago Hypertension Blood pressure high normal -Continue metoprolol  - Hold amlodipine , losartan , hydrochlorothiazide  - Continue Lipitor  Atrial fibrillation with RVR Rate controlled today - Continue Eliquis   AKI on CKD stage IIIa Creatinine resolved to baseline  Hyponatremia Stable  Sleep apnea on CPAP  Class I obesity  Hypokalemia      Subjective: No change.  No new fever.  Cardiology planned TEE on Monday.     Physical Exam: BP (!) 141/83 (BP Location: Right Arm)   Pulse 87   Temp 98.2 F (36.8 C) (Oral)   Resp 18   Ht 5' 9 (1.753 m)   Wt 106.5 kg   SpO2 97%   BMI 34.67 kg/m   Adult male, clinically no change, sitting in bed, soiled himself, interactive but sluggish RRR, no murmurs, no peripheral edema Respiratory rate normal, lungs clear without rales or wheezes but overall inspiration limits exam Abdomen soft, no tenderness palpation or guarding Psychomotor slowing noted, responds to questions, oriented to self, but not month or floor of the hospital, generalized weakness, severe  Data Reviewed: Discussed with ID Basic metabolic panel shows hypokalemia, improved renal function CBC shows improving  thrombocytopenia, worsening anemia    Family Communication: Wife at the bedside    Disposition: Status is: Inpatient         Author: Lonni SHAUNNA Dalton, MD 06/21/2024 4:50 PM  For on call review [email or web address].

## 2024-06-22 DIAGNOSIS — Z951 Presence of aortocoronary bypass graft: Secondary | ICD-10-CM | POA: Diagnosis not present

## 2024-06-22 DIAGNOSIS — B9561 Methicillin susceptible Staphylococcus aureus infection as the cause of diseases classified elsewhere: Secondary | ICD-10-CM | POA: Diagnosis not present

## 2024-06-22 DIAGNOSIS — I480 Paroxysmal atrial fibrillation: Secondary | ICD-10-CM | POA: Diagnosis not present

## 2024-06-22 DIAGNOSIS — R7989 Other specified abnormal findings of blood chemistry: Secondary | ICD-10-CM | POA: Diagnosis not present

## 2024-06-22 DIAGNOSIS — R7881 Bacteremia: Secondary | ICD-10-CM | POA: Diagnosis not present

## 2024-06-22 LAB — CBC
HCT: 33.3 % — ABNORMAL LOW (ref 39.0–52.0)
Hemoglobin: 11.8 g/dL — ABNORMAL LOW (ref 13.0–17.0)
MCH: 32.2 pg (ref 26.0–34.0)
MCHC: 35.4 g/dL (ref 30.0–36.0)
MCV: 90.7 fL (ref 80.0–100.0)
Platelets: 99 K/uL — ABNORMAL LOW (ref 150–400)
RBC: 3.67 MIL/uL — ABNORMAL LOW (ref 4.22–5.81)
RDW: 14.3 % (ref 11.5–15.5)
WBC: 6.8 K/uL (ref 4.0–10.5)
nRBC: 0 % (ref 0.0–0.2)

## 2024-06-22 LAB — BASIC METABOLIC PANEL WITH GFR
Anion gap: 5 (ref 5–15)
BUN: 23 mg/dL (ref 8–23)
CO2: 24 mmol/L (ref 22–32)
Calcium: 8.1 mg/dL — ABNORMAL LOW (ref 8.9–10.3)
Chloride: 107 mmol/L (ref 98–111)
Creatinine, Ser: 1.17 mg/dL (ref 0.61–1.24)
GFR, Estimated: >60 mL/min (ref >60)
Glucose, Bld: 113 mg/dL — ABNORMAL HIGH (ref 70–99)
Potassium: 4 mmol/L (ref 3.5–5.1)
Sodium: 136 mmol/L (ref 135–145)

## 2024-06-22 NOTE — Plan of Care (Signed)

## 2024-06-22 NOTE — Progress Notes (Signed)
  Progress Note  Patient Name: Philip [name] Date of Encounter: 06/22/2024 [facility] Cardiologist: Dorn Lesches, MD   Interval Summary   No acute overnight events. Patient reports no appetite today but otherwise no new or acute complaints.   Remains in SR.   Vital Signs Vitals:   06/21/24 2317 06/22/24 0316 06/22/24 0327 06/22/24 0727  BP: (!) 141/89 (!) 142/79  (!) 153/83  Pulse: 82 79    Resp: 20 20    Temp: 98.2 F (36.8 C) 98.4 F (36.9 C)  98 F (36.7 C)  TempSrc: Oral Oral  Oral  SpO2: 96% 97%  98%  Weight:   107.3 kg   Height:        Intake/Output Summary (Last 24 hours) at 06/22/2024 0901 Last data filed at 06/22/2024 0503 Gross per 24 hour  Intake 640 ml  Output 1925 ml  Net -1285 ml      06/22/2024    3:27 AM 06/21/2024    4:15 AM 06/20/2024    4:10 AM  Last 3 Weights  Weight (lbs) 236 lb 8.9 oz 234 lb 12.6 oz 236 lb 1.8 oz  Weight (kg) 107.3 kg 106.5 kg 107.1 kg      Telemetry/ECG  SR, some PVCs - Personally Reviewed  Physical Exam  General: Well developed, in no acute distress.  Neck: No JVD.  Cardiac: Normal rate, regular rhythm.  Resp: Normal work of breathing.  Ext: No edema.  Neuro: No gross focal deficits.  Psych: Normal affect.   Assessment & Plan  Atrial Fibrillation: Likely provoked by sepsis. Patient found to be in Afib with RVR on admission. Now back to sinus rhythm.  - Continue metoprolol  tartrate 50 mg BID. - Continue eliquis  5 mg BID.   NSTEMI: Per Dr. Lesches, suspect type II secondary to demand ischemia in setting of sepsis, AF w/ RVR and known CAD.  CAD s/p CABG  - Echo this admission with EF 50% - No plans for cath at this time per Dr. Lesches. No chest pain.  - Not on ASA due to Eliquis . - Continue metoprolol  tartrate. - Continue atorvastain 40 mg daily.   Sepsis  MSSA bacteremia - TEE planned for Monday - see progress note 8/21 for consent.    For questions or updates, please contact [facility] Please consult [email or web address] for contact info under       Signed, Fonda Kitty, MD

## 2024-06-22 NOTE — Progress Notes (Signed)
  Progress Note   Patient: Philip [name] FMW:[id number] DOB: 02/23/1941 DOA: 06/17/2024     4 DOS: the patient was seen and examined on 06/22/2024 at 8:50AM      Brief hospital course: 83 y.o. M with AF on Eliquis , CAD s/p CABG 1999, AAA s/p stenting, CKD IIIa baseline 1.3 who prseented with MSSA bacteremia from Mohs surgical wound.       Assessment and Plan: Sepsis and MSSA bacteremia - Continue cefazolin  - TEE Monday  Thrombocytopenia Improving considerably  Coronary artery disease Demand ischemia No ischemic workup planned - Continue metoprolol , Lipitor - Hold amlodipine , losartan , HCTZ  Atrial fibrillation with RVR - Continue Eliquis          Subjective: Patient is still weak and tired, he has persistent encephalopathy although he is oriented to place and self today.  He is unable to move himself up in bed.  He is incontinent of stool.     Physical Exam: BP (!) 143/71 (BP Location: Right Arm)   Pulse 82   Temp 97.8 F (36.6 C) (Oral)   Resp 20   Ht 5' 9 (1.753 m)   Wt 107.3 kg   SpO2 98%   BMI 34.93 kg/m   Adult male, clinically no change, sitting in bed, soiled himself, interactive but sluggish RRR, no murmurs, no peripheral edema Respiratory rate normal, lungs clear without rales or wheezes but overall inspiration limits exam Abdomen soft, no tenderness palpation or guarding Psychomotor slowing noted, responds to questions, oriented to self, but not month or floor of the hospital, generalized weakness, severe  Data Reviewed: Basic metabolic panel shows normal electrolytes and renal function CBC shows a stable anemia, resolved thrombocytopenia    Family Communication:     Disposition: Status is: Inpatient         Author: Lonni SHAUNNA Dalton, MD 06/22/2024 7:33 PM  For on call review [email or web address].

## 2024-06-22 NOTE — Progress Notes (Signed)
 Mobility Specialist Progress Note:    06/22/24 1459  Mobility  Activity Ambulated with assistance  Level of Assistance Contact guard assist, steadying assist  Assistive Device Front wheel walker  Distance Ambulated (ft) 250 ft  Activity Response Tolerated well  Mobility Referral Yes  Mobility visit 1 Mobility  Mobility Specialist Start Time (ACUTE ONLY) 1330  Mobility Specialist Stop Time (ACUTE ONLY) 1343  Mobility Specialist Time Calculation (min) (ACUTE ONLY) 13 min   Pt received in chair agreeable to mobility. No c/o throughout. No physical assistance needed, contact guard for safety. Returned to room w/o fault. Left in chair w/ call bell and personal belongings in reach. All needs met. Family in room.  Thersia Minder Mobility Specialist  Please contact vis Secure Chat or  Rehab Office [phone]

## 2024-06-22 NOTE — Plan of Care (Signed)

## 2024-06-23 DIAGNOSIS — Z951 Presence of aortocoronary bypass graft: Secondary | ICD-10-CM | POA: Diagnosis not present

## 2024-06-23 DIAGNOSIS — B9561 Methicillin susceptible Staphylococcus aureus infection as the cause of diseases classified elsewhere: Secondary | ICD-10-CM | POA: Diagnosis not present

## 2024-06-23 DIAGNOSIS — R7881 Bacteremia: Secondary | ICD-10-CM | POA: Diagnosis not present

## 2024-06-23 DIAGNOSIS — R7989 Other specified abnormal findings of blood chemistry: Secondary | ICD-10-CM | POA: Diagnosis not present

## 2024-06-23 DIAGNOSIS — I480 Paroxysmal atrial fibrillation: Secondary | ICD-10-CM | POA: Diagnosis not present

## 2024-06-23 NOTE — Progress Notes (Signed)
  Progress Note   Patient: Philip [name] FMW:[id number] DOB: 1941-09-26 DOA: 06/17/2024     5 DOS: the patient was seen and examined on 06/23/2024 at 8:19AM      Brief hospital course: 83 y.o. M with AF on Eliquis , CAD s/p CABG 1999, AAA s/p stenting, CKD IIIa baseline 1.3 who prseented with MSSA bacteremia from Mohs surgical wound.       Assessment and Plan: Sepsis from MSSA bacteremia and from left neck surgical wound - Continue cefazolin  - Consult ID, appreciate recommendations - Plan for TEE Monday, will finalize antibiotic regimen after that and likely discharge Monday or Tuesday  Acute metabolic encephalopathy from sepsis No significant change. - Delirium precautions  Thrombocytopenia Improving - Check CBC tomorrow  Paroxysmal atrial fibrillation Stable, cardiology resumed telemetry - Continue Eliquis , metoprolol  - Follow-up with cardiology outpatient  Coronary artery disease Demand ischemia No ischemic workup needed - Continue metoprolol , Lipitor - Hold losartan , amlodipine , hydrochlorothiazide          Subjective: No new change, patient feels stable, he still remains somewhat confused which is not his baseline.     Physical Exam: BP (!) 143/83 (BP Location: Right Arm)   Pulse 79   Temp 98.2 F (36.8 C) (Oral)   Resp (!) 21   Ht 5' 9 (1.753 m)   Wt 106.7 kg   SpO2 96%   BMI 34.74 kg/m   Adult male, lying in bed, eating breakfast Irregular, no murmurs, no peripheral edema Respiratory rate normal, lungs clear without rales or wheezes Abdomen soft, no tenderness palpation or guarding Psychomotor slowing still present, responds to questions, oriented to self, but not place or time, has generalized weakness, but this seems to be improving.    Data Reviewed: No new labs    Family Communication: None present    Disposition: Status is: Inpatient         Author: Lonni SHAUNNA Dalton, MD 06/23/2024 12:29 PM  For on call review  [email or web address].

## 2024-06-23 NOTE — Plan of Care (Signed)

## 2024-06-23 NOTE — Progress Notes (Signed)
  Progress Note  Patient Name: Philip [name] Date of Encounter: 06/23/2024 [facility] Cardiologist: Dorn Lesches, MD   Interval Summary   No acute overnight events. Patient reports little appetite this morning, otherwise feeling relatively well. No new or acute complaints.    Vital Signs Vitals:   06/22/24 2311 06/23/24 0135 06/23/24 0300 06/23/24 0747  BP: 139/74  134/65   Pulse: 72  78   Resp: 19  18 18   Temp: 98 F (36.7 C)  98.2 F (36.8 C) 98.2 F (36.8 C)  TempSrc: Oral  Oral Oral  SpO2: 97%  98%   Weight:  106.7 kg    Height:        Intake/Output Summary (Last 24 hours) at 06/23/2024 0821 Last data filed at 06/23/2024 0331 Gross per 24 hour  Intake 320 ml  Output 2050 ml  Net -1730 ml      06/23/2024    1:35 AM 06/22/2024    3:27 AM 06/21/2024    4:15 AM  Last 3 Weights  Weight (lbs) 235 lb 3.7 oz 236 lb 8.9 oz 234 lb 12.6 oz  Weight (kg) 106.7 kg 107.3 kg 106.5 kg      Telemetry/ECG  Disconnected   Physical Exam  General: Well developed, in no acute distress.  Neck: No JVD.  Cardiac: Normal rate, regular rhythm.  Resp: Normal work of breathing.  Ext: No edema.  Neuro: No gross focal deficits.  Psych: Normal affect.    Assessment & Plan  Atrial Fibrillation: Likely provoked by sepsis. Patient found to be in Afib with RVR on admission. Now back to sinus rhythm, appears to be maintaining, although telemetry disconnected. - Continue metoprolol  tartrate 50 mg BID. - Continue eliquis  5 mg BID. - Reconnect telemetry.   NSTEMI: Per Dr. Lesches, suspect type II secondary to demand ischemia in setting of sepsis, AF w/ RVR and known CAD.  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  - Echo this admission with EF 50% - No plans for cath at this time per Dr. Lesches. No chest pain.  - Not on ASA due to Eliquis . - Continue metoprolol  tartrate. - Continue atorvastain 40 mg daily.   Sepsis  MSSA bacteremia - TEE planned for tomorrow to assess for endocarditis  - see progress note 8/21 for consent.  For questions or updates, please contact [facility] Please consult [email or web address] for contact info under       Signed, Fonda Kitty, MD

## 2024-06-23 NOTE — Progress Notes (Signed)
 Mobility Specialist Progress Note:    06/23/24 1137  Mobility  Activity Ambulated with assistance  Level of Assistance Contact guard assist, steadying assist  Assistive Device Front wheel walker  Distance Ambulated (ft) 250 ft  Activity Response Tolerated well  Mobility Referral Yes  Mobility visit 1 Mobility  Mobility Specialist Start Time (ACUTE ONLY) 1036  Mobility Specialist Stop Time (ACUTE ONLY) 1046  Mobility Specialist Time Calculation (min) (ACUTE ONLY) 10 min   Received pt in chair having no complaints and agreeable to mobility. Pt was asymptomatic throughout ambulation and returned to room w/o fault. Left in chair w/ call bell in reach and all needs met.   Thersia Minder Mobility Specialist  Please contact vis Secure Chat or  Rehab Office [phone]

## 2024-06-24 ENCOUNTER — Encounter (HOSPITAL_COMMUNITY): Payer: Self-pay | Admitting: Internal Medicine

## 2024-06-24 ENCOUNTER — Inpatient Hospital Stay (HOSPITAL_COMMUNITY)

## 2024-06-24 ENCOUNTER — Encounter (HOSPITAL_COMMUNITY)
Admission: EM | Disposition: A | Discharge status: Home / Self Care | Payer: Self-pay | Source: Home / Self Care | Attending: Family Medicine

## 2024-06-24 DIAGNOSIS — B9561 Methicillin susceptible Staphylococcus aureus infection as the cause of diseases classified elsewhere: Secondary | ICD-10-CM | POA: Diagnosis not present

## 2024-06-24 DIAGNOSIS — Z87891 Personal history of nicotine dependence: Secondary | ICD-10-CM | POA: Diagnosis not present

## 2024-06-24 DIAGNOSIS — R7881 Bacteremia: Secondary | ICD-10-CM | POA: Diagnosis not present

## 2024-06-24 DIAGNOSIS — I330 Acute and subacute infective endocarditis: Secondary | ICD-10-CM

## 2024-06-24 DIAGNOSIS — I10 Essential (primary) hypertension: Secondary | ICD-10-CM | POA: Diagnosis not present

## 2024-06-24 DIAGNOSIS — I340 Nonrheumatic mitral (valve) insufficiency: Secondary | ICD-10-CM | POA: Diagnosis not present

## 2024-06-24 DIAGNOSIS — I2510 Atherosclerotic heart disease of native coronary artery without angina pectoris: Secondary | ICD-10-CM

## 2024-06-24 DIAGNOSIS — A4101 Sepsis due to Methicillin susceptible Staphylococcus aureus: Secondary | ICD-10-CM

## 2024-06-24 DIAGNOSIS — Z951 Presence of aortocoronary bypass graft: Secondary | ICD-10-CM

## 2024-06-24 HISTORY — PX: TRANSESOPHAGEAL ECHOCARDIOGRAM (CATH LAB): EP1270

## 2024-06-24 LAB — BASIC METABOLIC PANEL WITH GFR
Anion gap: 8 (ref 5–15)
BUN: 19 mg/dL (ref 8–23)
CO2: 24 mmol/L (ref 22–32)
Calcium: 9 mg/dL (ref 8.9–10.3)
Chloride: 103 mmol/L (ref 98–111)
Creatinine, Ser: 1.32 mg/dL — ABNORMAL HIGH (ref 0.61–1.24)
GFR, Estimated: 53 mL/min — ABNORMAL LOW (ref >60)
Glucose, Bld: 108 mg/dL — ABNORMAL HIGH (ref 70–99)
Potassium: 3.7 mmol/L (ref 3.5–5.1)
Sodium: 135 mmol/L (ref 135–145)

## 2024-06-24 LAB — CULTURE, BLOOD (ROUTINE X 2)
Culture: NO GROWTH
Culture: NO GROWTH
Special Requests: ADEQUATE
Special Requests: ADEQUATE

## 2024-06-24 LAB — CBC
HCT: 35.2 % — ABNORMAL LOW (ref 39.0–52.0)
Hemoglobin: 12.2 g/dL — ABNORMAL LOW (ref 13.0–17.0)
MCH: 31.9 pg (ref 26.0–34.0)
MCHC: 34.7 g/dL (ref 30.0–36.0)
MCV: 91.9 fL (ref 80.0–100.0)
Platelets: 180 K/uL (ref 150–400)
RBC: 3.83 MIL/uL — ABNORMAL LOW (ref 4.22–5.81)
RDW: 14.2 % (ref 11.5–15.5)
WBC: 7.1 K/uL (ref 4.0–10.5)
nRBC: 0 % (ref 0.0–0.2)

## 2024-06-24 LAB — ECHO TEE

## 2024-06-24 SURGERY — TRANSESOPHAGEAL ECHOCARDIOGRAM (TEE) (CATHLAB)
Anesthesia: Monitor Anesthesia Care

## 2024-06-24 MED ORDER — PROPOFOL 10 MG/ML IV BOLUS
INTRAVENOUS | Status: DC | PRN
  Administered 2024-06-24: 100 ug/kg/min via INTRAVENOUS
  Administered 2024-06-24: 100 mg via INTRAVENOUS
  Administered 2024-06-24: 50 mg via INTRAVENOUS

## 2024-06-24 MED ORDER — SODIUM CHLORIDE 0.9 % IV SOLN
INTRAVENOUS | Status: DC
Start: 2024-06-24 — End: 2024-06-24

## 2024-06-24 MED ORDER — LIDOCAINE 2% (20 MG/ML) 5 ML SYRINGE
INTRAMUSCULAR | Status: DC | PRN
  Administered 2024-06-24: 100 mg via INTRAVENOUS

## 2024-06-24 NOTE — Consult Note (Signed)
 WOC Nurse Consult Note: Reason for Consult: reassess photo for L neck wound per surgeon request. Received a calling about the case. Dr. Jonel will visit the patient and request a evaluation about the last wound care order.  Waiting the new notes and photo to reassess. Last note:  For left neck wound: Change dressing twice daily Cleanse with sterile saline, pat dry Apply Xeroform to wound Cover with Gauze Change twice daily  Please reconsult if further assistance is needed. Thank-you,  Lela Holm RN, CNS, [employer], MSN.  (Phone [phone])

## 2024-06-24 NOTE — Plan of Care (Signed)
  Problem: Education: Goal: Knowledge of General Education information will improve Description: Including pain rating scale, medication(s)/side effects and non-pharmacologic comfort measures Outcome: Progressing   Problem: Health Behavior/Discharge Planning: Goal: Ability to manage health-related needs will improve Outcome: Progressing   Problem: Clinical Measurements: Goal: Ability to maintain clinical measurements within normal limits will improve Outcome: Progressing Goal: Diagnostic test results will improve Outcome: Progressing Goal: Cardiovascular complication will be avoided Outcome: Progressing   Problem: Nutrition: Goal: Adequate nutrition will be maintained Outcome: Progressing   

## 2024-06-24 NOTE — Progress Notes (Signed)
 PT Cancellation Note  Patient Details Name: Philip [name] MRN: [id number] DOB: May 19, 1941   Cancelled Treatment:    Reason Eval/Treat Not Completed: Other (comment). Entered room and pt in bed incontinent of stool. Had not attempted to call for assist. Called to have nurse staff assist to be cleaned so I can return later and concentrate on mobility.   Rodgers LELON [facility] 06/24/2024, 10:58 AM  [facility]al PT Acute Rehabilitation Services Office [phone]

## 2024-06-24 NOTE — Progress Notes (Signed)
 Physical Therapy Treatment Patient Details Name: Philip [name] MRN: [id number] DOB: 19-Nov-1940 Today's Date: 06/24/2024   History of Present Illness 83 yr old male who presented 06/17/24 due to wondering a parking lot. Pt recently had ear sx with graft from the neck. Pt with sepsis due to MSSA bacteremia from neck wound. Pt also with encephalopathy.  PMH: afib, CAD s/p CABG, PAD, OSA, AAA stenting, penile prothesis    PT Comments  Pt making good progress with mobility and only requiring supervision/CGA ambulating entire unit. Pt's cognition remains below baseline with slow processing, decr memory, and decr awareness of his situation. Currently he is more confident with gait with rollator so will recommend one for home. Recommend HHPT and he will need to have supervision due to his cognition when he returns home.     If plan is discharge home, recommend the following: Supervision due to cognitive status;Help with stairs or ramp for entrance;Assist for transportation;Assistance with cooking/housework   Can travel by private vehicle        Equipment Recommendations  None recommended by PT    Recommendations for Other Services       Precautions / Restrictions Precautions Precautions: Fall Restrictions Weight Bearing Restrictions Per Provider Order: No     Mobility  Bed Mobility               General bed mobility comments: Pt up in chair    Transfers Overall transfer level: Needs assistance Equipment used: None Transfers: Sit to/from Stand, Bed to chair/wheelchair/BSC Sit to Stand: Supervision                Ambulation/Gait Ambulation/Gait assistance: Supervision, Contact guard assist Gait Distance (Feet): 450 Feet Assistive device: Rollator (4 wheels), None Gait Pattern/deviations: Step-through pattern, Decreased stride length Gait velocity: decr Gait velocity interpretation: 1.31 - 2.62 ft/sec, indicative of limited community ambulator   General Gait Details:  With rollator supervision for safety. With no device CGA.   Stairs             Wheelchair Mobility     Tilt Bed    Modified Rankin (Stroke Patients Only)       Balance Overall balance assessment: Needs assistance Sitting-balance support: No upper extremity supported Sitting balance-Leahy Scale: Good     Standing balance support: No upper extremity supported, During functional activity Standing balance-Leahy Scale: Fair                              Hotel manager: No apparent difficulties  Cognition Arousal: Alert Behavior During Therapy: Flat affect   PT - Cognitive impairments: Orientation, Awareness, Memory, Problem solving   Orientation impairments: Time                   PT - Cognition Comments: Pt unable to remember if he ate breakfast when son asked (he was actually NPO). Slow processsing. Following commands: Impaired Following commands impaired: Follows multi-step commands with increased time    Cueing Cueing Techniques: Verbal cues, Tactile cues  Exercises      General Comments        Pertinent Vitals/Pain Pain Assessment Pain Assessment: No/denies pain    Home Living                          Prior Function            PT Goals (current goals can  now be found in the care plan section) Acute Rehab PT Goals Patient Stated Goal: back independent Progress towards PT goals: Progressing toward goals    Frequency    Min 2X/week      PT Plan      Co-evaluation              AM-PAC PT 6 Clicks Mobility   Outcome Measure  Help needed turning from your back to your side while in a flat bed without using bedrails?: A Little Help needed moving from lying on your back to sitting on the side of a flat bed without using bedrails?: A Little Help needed moving to and from a bed to a chair (including a wheelchair)?: A Little Help needed standing up from a chair using your arms  (e.g., wheelchair or bedside chair)?: A Little Help needed to walk in hospital room?: A Little Help needed climbing 3-5 steps with a railing? : A Little 6 Click Score: 18    End of Session Equipment Utilized During Treatment: Gait belt Activity Tolerance: Patient tolerated treatment well Patient left: in chair;with call bell/phone within reach;with chair alarm set   PT Visit Diagnosis: Unsteadiness on feet (R26.81);Muscle weakness (generalized) (M62.81);Difficulty in walking, not elsewhere classified (R26.2)     Time: 1129-1150 PT Time Calculation (min) (ACUTE ONLY): 21 min  Charges:    $Gait Training: 8-22 mins PT General Charges $$ ACUTE PT VISIT: 1 Visit                     [facility] PT Acute Rehabilitation Services Office [phone]    Rodgers ORN [facility] 06/24/2024, 2:39 PM

## 2024-06-24 NOTE — Progress Notes (Signed)
 Inpatient Rehab Admissions Coordinator:    CIR following. Pt. States he prefers d/c home but is open to CIR if he cannot progress well enough to go directly home.   Leita Kleine, MS, CCC-SLP Rehab Admissions Coordinator  [phone] (celll) [phone] (office)

## 2024-06-24 NOTE — Plan of Care (Signed)

## 2024-06-24 NOTE — CV Procedure (Signed)
 INDICATIONS: endocarditis  PROCEDURE:   Informed consent was obtained prior to the procedure. The risks, benefits and alternatives for the procedure were discussed and the patient comprehended these risks.  Risks include, but are not limited to, cough, sore throat, vomiting, nausea, somnolence, esophageal and stomach trauma or perforation, bleeding, low blood pressure, aspiration, pneumonia, infection, trauma to the teeth and death.    After a procedural time-out, the oropharynx was anesthetized with 20% benzocaine spray.   During this procedure the patient was administered propofol  per anesthesia.  The patient's heart rate, blood pressure, and oxygen saturation were monitored continuously during the procedure. The period of conscious sedation was 30 minutes, of which I was present face-to-face 100% of this time.  The transesophageal probe was inserted in the esophagus and stomach without difficulty and multiple views were obtained.  The patient was kept under observation until the patient left the procedure room.  The patient left the procedure room in stable condition.   Agitated microbubble saline contrast was not administered.  COMPLICATIONS:    There were no immediate complications.  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 Mobile vegetation. Mild MS, mild-moderate MR. Possible perforation at site of vegatation with trivial additional MR.   RECOMMENDATIONS:    At least 6 wk abx per ID.   Time Spent Directly with the Patient:  60 minutes   Clayten Allcock A Jersee Winiarski 06/24/2024, 2:49 PM

## 2024-06-24 NOTE — Consult Note (Addendum)
 [address]       [place] [address]             [phone]        Philip [name] [facility] Medical Record #[id number] Date of Birth: Sep 23, 1941  Referring:  [facility] Primary Care: [name], MD Primary Cardiologist:Jonathan Court, MD  Chief Complaint:    Chief Complaint  Patient presents with   Code Sepsis   History of Present Illness:      Philip [name] is an 83 yo male with known history of CAD, S/P CABG performed in 1999 in [place], Paroxysmal Atrial Fibrillation, HLD, TAA S/P Stenting by Dr. Eliza,  H/O Melanoma and most recently squamous cell carcinoma.  He recently was brought to the ED on 8/18 after being found wandering in the parking lot by office staff.  He recently underwent surgical excision on his left ear and was having bleeding issues and required cauterization.  He had also been undergoing treatment with Doxycycline.  Upon evaluation by EMS the patient was very confused.  He was noted to be hypoxic and required oxygen at 4L [place], he was tachycardic and his blood pressure was elevated.  He was also noted to have a fever.  He was worked up for Sepsis.  EKG obtained showed Atrial Fibrillation with RVR.  He was hypokalemic and supplemented accordingly.  He was felt to warrant admission.  He was started on broad spectrum antibiotics.  CT head was negative.  CT of neck showed evidence of recent skin grafting, but no fluid collection to indicate infection.  Blood cultures came back positive for Staph Aureus.  Infectious Disease consult was obtained, they made antibiotic recommendations, and workup for Endocarditis.  TTE showed no evidence of endocarditis.  TEE was performed today and this showed mitral valve endocarditis with a 1.8 x 1.2 cm mass on mitral valve atrial surface adherent to a shelf of MAC.  He has mild MS, mild to moderate MR with possible perforation at the site of vegetation.  Cardiothoracic surgery consultation has been requested.   Currently the patient is in post TEE holding.  He is overall a poor historian.  He is confused at baseline and this is apparent during exam.  He does not recall who or where his previous coronary bypass grafting procedure was performed.  He currently denies chest pain and shortness of breath.  When asked about his recent ear surgery he stated yes.  He stated he thinks it was squamous cancer.  He is unsure what happened to his neck.  The dressing is marked 8/1 he is unsure if he was supposed to change this or have follow up. It is heavily soiled.  He has some of his own teeth but also has partials.  He has not been to a dentist in a while.  He states he would prefer to avoid surgery.  Past Medical History:  Diagnosis Date   AAA (abdominal aortic aneurysm) (HCC)    Arthritis    Atrial fibrillation (HCC)    CAD (coronary artery disease)    had a MI , s/p CABG   Cataracts, bilateral    immature   CHF (congestive heart failure) (HCC)    Dysrhythmia    paroximal a fib   GERD (gastroesophageal reflux disease)    takes Omeprazole  daily   History of colon polyps    History of kidney stones    History of shingles  Hyperlipidemia    Hypertension    takes Amlodipine ,Metoprolol ,and Lisinopril  daily   Joint pain    Joint swelling    Myocardial infarction [facility])    several with last one being 2001(but never knew about any except 2001)   OSA (obstructive sleep apnea)    started CPAP 12/09   Peripheral vascular disease (HCC)    Peyronie's disease    s/p penile implant in 2006   Pneumonia    as a child   Skin cancer    several , one of them was melanoma (sees derm routinely)   Sleep apnea    Tingling    feet occasionally   Tinnitus     Past Surgical History:  Procedure Laterality Date   ABDOMINAL AORTAGRAM N/A 06/16/2014   Procedure: ABDOMINAL EZELLA;  Surgeon: Lonni GORMAN Blade, MD;  Location[facility] C[facility] LAB;  Service: Cardiovascular;  Laterality: N/A;   ABDOMINAL AORTIC  ENDOVASCULAR STENT GRAFT N/A 07/08/2014   Procedure: ABDOMINAL AORTIC ENDOVASCULAR STENT GRAFT/ GORE;  Surgeon: Lonni GORMAN Blade, MD;  Location: [facility] OR;  Service: Vascular;  Laterality: N/A;   CARDIAC CATHETERIZATION  10/31/2013   COLONOSCOPY     CORONARY ARTERY BYPASS GRAFT  10/31/1997   x 3 vessels   LEFT HEART CATH AND CORS/GRAFTS ANGIOGRAPHY N/A 10/07/2021   Procedure: LEFT HEART CATH AND CORS/GRAFTS ANGIOGRAPHY;  Surgeon: Verlin Lonni BIRCH, MD;  Location: [facility] INVASIVE CV LAB;  Service: Cardiovascular;  Laterality: N/A;   LEFT HEART CATHETERIZATION WITH CORONARY/GRAFT ANGIOGRAM N/A 05/29/2014   Procedure: LEFT HEART CATHETERIZATION WITH EL BILE;  Surgeon: Ezra GORMAN Shuck, MD;  Location: [facility] CATH LAB;  Service: Cardiovascular;  Laterality: N/A;   PENILE PROSTHESIS IMPLANT  08/31/2005   AMS inflatable penile prosthesis   RHINOPLASTY  10/31/1973    Social History   Tobacco Use  Smoking Status Former   Current packs/day: 0.00   Types: Cigarettes   Quit date: 1979   Years since quitting: 46.6  Smokeless Tobacco Never  Tobacco Comments   quit smoking in 1979    Social History   Substance and Sexual Activity  Alcohol Use Yes   Comment: occasionally - once per week     No Known Allergies  Current Facility-Administered Medications  Medication Dose Route Frequency Provider Last Rate Last Admin   acetaminophen  (TYLENOL ) tablet 650 mg  650 mg Oral Q6H PRN Pokhrel, Laxman, MD   650 mg at 06/19/24 1346   Or   acetaminophen  (TYLENOL ) suppository 650 mg  650 mg Rectal Q6H PRN Pokhrel, Laxman, MD       apixaban  (ELIQUIS ) tablet 5 mg  5 mg Oral BID Henry Shaver B, NP   5 mg at 06/23/24 2140   atorvastatin  (LIPITOR) tablet 40 mg  40 mg Oral Daily Pokhrel, Laxman, MD   40 mg at 06/23/24 0932   ceFAZolin  (ANCEF ) IVPB 2g/100 mL premix  2 g Intravenous Q8H Luiz Channel, MD 200 mL/hr at 06/24/24 0600 2 g at 06/24/24 0600   HYDROcodone -acetaminophen   (NORCO/VICODIN) 5-325 MG per tablet 1-2 tablet  1-2 tablet Oral Q4H PRN Pokhrel, Laxman, MD       metoprolol  tartrate (LOPRESSOR ) tablet 50 mg  50 mg Oral BID Johnson, Kathleen R, PA-C   50 mg at 06/23/24 2140   multivitamin with minerals tablet 1 tablet  1 tablet Oral Daily Pokhrel, Laxman, MD   1 tablet at 06/23/24 0932   polyethylene glycol (MIRALAX  / GLYCOLAX ) packet 17 g  17 g Oral Daily PRN Pokhrel, Laxman,  MD       polyethylene glycol (MIRALAX  / GLYCOLAX ) packet 17 g  17 g Oral Daily Pokhrel, Laxman, MD   17 g at 06/23/24 0932   sodium chloride  flush (NS) 0.9 % injection 3 mL  3 mL Intravenous Q12H Pokhrel, Laxman, MD   3 mL at 06/24/24 0947   sodium chloride  flush (NS) 0.9 % injection 3 mL  3 mL Intravenous PRN Pokhrel, Laxman, MD       white petrolatum  (VASELINE) gel   Topical Daily Danford, Lonni SQUIBB, MD   Given at 06/24/24 [phone]    Medications Prior to Admission  Medication Sig Dispense Refill Last Dose/Taking   amLODipine  (NORVASC ) 10 MG tablet Take 1 tablet (10 mg total) by mouth at bedtime. 90 tablet 4 06/16/2024 Bedtime   Ascorbic Acid (VITAMIN C ER PO) Take 500 mg by mouth daily.   06/15/2024   atorvastatin  (LIPITOR) 40 MG tablet Take 1 tablet (40 mg total) by mouth daily. 90 tablet 4 06/17/2024 Morning   B Complex-Biotin-FA (B-COMPLEX PO) Take 1 tablet by mouth daily.   06/15/2024   co-enzyme Q-10 30 MG capsule Take 30 mg by mouth daily.   06/15/2024   doxycycline (ADOXA) 100 MG tablet Take 100 mg by mouth 2 (two) times daily.   06/17/2024 Morning   ELIQUIS  5 MG TABS tablet TAKE 1 TABLET TWICE DAILY 180 tablet 3 06/17/2024 at  9:00 AM   hydrochlorothiazide  (HYDRODIURIL ) 25 MG tablet Take 1 tablet (25 mg total) by mouth daily. 90 tablet 1 06/17/2024 Morning   losartan  (COZAAR ) 100 MG tablet Take 1 tablet (100 mg total) by mouth daily. 90 tablet 1 06/17/2024 Morning   magnesium  oxide (MAG-OX) 400 MG tablet Take 800 mg by mouth daily.   06/15/2024   Multiple Vitamins-Minerals  (MULTIVITAMIN WITH MINERALS) tablet Take 1 tablet by mouth daily.   06/15/2024   Omega-3 Fatty Acids (OMEGA-3 FISH OIL ) 1200 MG CAPS Take 2 capsules (2,400 mg total) by mouth daily. Take 2 1,200 mg capsules daily 90 capsule 3 06/15/2024   polyethylene glycol (MIRALAX  / GLYCOLAX ) packet Take 17 g by mouth daily.   06/16/2024 Morning   hydrocortisone 2.5 % cream Apply 1 application topically as needed (skin irritation). (Patient not taking: No sig reported)   Not Taking    Family History  Problem Relation Age of Onset   Prostate cancer Father 110   Heart disease Father    Skin cancer Father    Heart attack Father    Cancer Father    Deep vein thrombosis Father    Hyperlipidemia Father    Hypertension Father    Heart disease Mother    Skin cancer Mother    Heart attack Mother    Cancer Mother    Hyperlipidemia Mother    Hypertension Mother    Varicose Veins Mother    Peripheral vascular disease Mother        amputation   Sleep apnea Brother    Hyperlipidemia Brother    Hypertension Brother    Prostate cancer Brother 1   Colon cancer Other        aunt   Skin cancer Brother    Skin cancer Sister    Cancer Sister    Heart disease Sister    Diabetes Neg Hx    Stroke Neg Hx    Review of Systems:   ROS    Cardiac Review of Systems: Y or  [    ]= no  Chest Pain [  N  ]  Resting SOB [  N ] Exertional SOB  [  ]  Orthopnea [  ]   Pedal Edema [ Y  ]    Palpitations [ Y ] Syncope  [  ]   Presyncope [   ]  General Review of Systems: [Y] = yes [  ]=no Constitional: recent weight change [  ]; anorexia [  ]; fatigue [  ]; nausea [  ]; night sweats [  ]; fever [ Y ]; or chills [  ]                                                               Dental: Last Dentist visit: unknown  Eye : blurred vision [  ]; diplopia [   ]; vision changes [  ];  Amaurosis fugax[  ]; Resp: cough [ Y ];  wheezing[  ];  hemoptysis[  ]; shortness of breath[  ]; paroxysmal nocturnal dyspnea[  ]; dyspnea on  exertion[  ]; or orthopnea[  ];  GI:  gallstones[  ], vomiting[  ];  dysphagia[  ]; melena[  ];  hematochezia [  ]; heartburn[  ];   Hx of  Colonoscopy[  ]; GU: kidney stones [  ]; hematuria[  ];   dysuria [  ];  nocturia[  ];  history of     obstruction [  ]; urinary frequency [  ]             Skin: rash, swelling[  ];, hair loss[  ];  peripheral edema[  ];  or itching[  ]; left neck wound with bloody drainage, Left ear with dirty bandages... assume from recent surgical procedure Musculosketetal: myalgias[  ];  joint swelling[  ];  joint erythema[  ];  joint pain[  ];  back pain[  ];  Heme/Lymph: bruising[  ];  bleeding[  ];  anemia[  ];  Neuro: TIA[  ];  headaches[  ];  stroke[  ];  vertigo[  ];  seizures[  ];   paresthesias[  ];  difficulty walking[  ];  Psych:depression[  ]; anxiety[  ];  Endocrine: diabetes[  ];  thyroid  dysfunction[  ];             Physical Exam: BP 132/73   Pulse 73   Temp 98.3 F (36.8 C) (Temporal)   Resp (!) 22   Ht 5' 9 (1.753 m)   Wt 104.2 kg   SpO2 93%   BMI 33.92 kg/m   General appearance: alert, cooperative, and no distress Head: Normocephalic, without obvious abnormality, atraumatic Neck: no adenopathy, no carotid bruit, no JVD, supple, symmetrical, trachea midline, thyroid  not enlarged, symmetric, no tenderness/mass/nodules, and Left neck wound with old dressing, bloody drainage Resp: diminished breath sounds bibasilar Cardio: irregularly irregular rhythm GI: soft, non-tender; bowel sounds normal; no masses,  no organomegaly Extremities: edema + pitting Neurologic: Mental status: confused  Diagnostic Studies & Laboratory data:     Recent Radiology Findings:   EP STUDY Result Date: 06/24/2024 See surgical note for result.    I have independently reviewed the above radiologic studies and discussed with the patient   Recent Lab Findings: Lab Results  Component Value Date   WBC 7.1 06/24/2024   HGB 12.2 (L)  06/24/2024   HCT 35.2 (L)  06/24/2024   PLT 180 06/24/2024   GLUCOSE 108 (H) 06/24/2024   CHOL 136 05/22/2024   TRIG 336.0 (H) 05/22/2024   HDL 43.90 05/22/2024   LDLDIRECT 68.0 04/13/2015   LDLCALC 25 05/22/2024   ALT 15 06/21/2024   AST 69 (H) 06/21/2024   NA 135 06/24/2024   K 3.7 06/24/2024   CL 103 06/24/2024   CREATININE 1.32 (H) 06/24/2024   BUN 19 06/24/2024   CO2 24 06/24/2024   TSH 5.21 05/22/2024   INR 1.5 (H) 06/18/2024   HGBA1C 6.0 02/07/2024    Assessment / Plan:      83 yo male with known CAD S/P CABG performed in 1999 at [place].  Found wondering, disoriented in parking lot.  EMS brought in with confirmed sepsis diagnosis for staph aureus.  Currently on broad spectrum ABX.. ID requested TEE which confirmed Mitral Valve Endocarditis.  Have to assume source is from left ear/neck region.  He is confused overall and poor historian.  In setting of previous sternotomy and advanced age would definitely be at higher risk for surgical intervention.  He does have some native teeth and would require dental clearance if surgery is felt to be indicated.  He is also currently on Eliquis  with known history of Atrial Fibrillation.  He would require washout from this prior to proceeding with surgery.  I think ideally it would be best to treat patient with antibiotics and avoid surgery if possible.  However, Dr. Kerrin is aware of patient and he will evaluate and make decision if patient is felt to require or be a candidate for surgical intervention.  I  spent 55 minutes counseling the patient face to face.   Philip Shad, PA-C 06/24/2024 3:33 PM  Patient seen and examined, records and TEE images reviewed.  Complicated case in 83 yo with prior CABG 25 years ago, now with mitral valve endocarditis with relatively large immobile vegetation with smaller mobile vegetation.  Blood cultures + Staph aureus.  No heart failure.  Normally would recommend surgery in this setting but he is an extremely poor candidate  with multiple risk factors and unlikely to have acceptable recovery postop.  Although he is at risk for septic emboli and/or progression of endocarditis, in my opinion, risk of poor outcome with surgery is even higher in his particular case.  Philip MOTE Kerrin, MD [facility] [phone]'

## 2024-06-24 NOTE — Progress Notes (Signed)
  Progress Note   Patient: Philip [name] FMW:[id number] DOB: [birth year] DOA: 06/17/2024     6 DOS: the patient was seen and examined on 06/24/2024 at 12:50PM and 4:30PM      Brief hospital course: 83 y.o. 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     Assessment and Plan: Sepsis from native mitral valve MSSA endocarditis Admitted and started on antibiotics.  Blood cultures growing MSSA.  Echocardiogram normal but TEE 8/25 showed 1.8 x 1.6 cm mitral valve vegetation. - Continue cefazolin  - Consult ID, appreciate cares - Consult cardiothoracic surgery  Acute metabolic encephalopathy  Seems excessively persistent at this point, and given what we now know about the mitral valve vegetation, have to be seriously concerned about septic emboli. - Obtain MRI brain - Delirium precautions   Left neck wound This appears to be dehiscing.  His derm does not have hospital privileges.  I discussed with the ENT.  Given the shallow depth, this is low risk, recommend twice daily dressing changes, will have to heal by secondary intention  - xeroform, change twice daily  Paroxysmal atrial fibrillation Stable - Continue Eliquis  and metoprolol   Coronary artery disease s/p CABG 1999 at [facility] ischemia Peripheral vascular disease s/p AAA stent No further ischemic workup planned - Continue metoprolol  and Lipitor - Hold losartan , amlodipine , and hydrochlorothiazide   Thrombocytopenia Resolved  AKI on CKD stage IIIa Creatinine 1.9 on admission, due to sepsis, improved to 1.1-1.3 which is his baseline        Subjective: TEE today showed vegetation.  CT surgery consulted.  Overall patient is unchanged, has no fever or new symptoms.     Physical Exam: BP 132/73   Pulse 73   Temp 98.3 F (36.8 C) (Temporal)   Resp (!) 22   Ht 5' 9  (1.753 m)   Wt 104.2 kg   SpO2 93%   BMI 33.92 kg/m   Adult male, sitting up in chair, interactive and appropriate RRR, no murmurs, no peripheral edema Respiratory rate normal, lungs clear without rales or wheezes Abdomen soft without tenderness palpation or guarding, no ascites or distention Psychomotor slowing noted, generalized weakness but symmetric strength, memory impairment seems mild but present    Data Reviewed: Discussed with cardiology and ID Basic metabolic panel shows stable renal function and electrolytes     Family Communication: Wife and brother at the bedside    Disposition: Status is: Inpatient         Author: Lonni SHAUNNA Dalton, MD 06/24/2024 6:32 PM  For on call review [email or web address].

## 2024-06-24 NOTE — Plan of Care (Addendum)
 Id brief note  Ula showed a large 1.8x1.2 mv vegetation/mass with possible perforation and trivial additional MR  Patient remains confused  8/20 repeat bcx ngtd  Afebrile     A/p Mssa mv endocarditis AMS    -agree with discussing case with CTS regarding potential early surgical intervention; suspect cns sx could be septic emboli and in setting large vegetation would potentially benefit from surgical intervention -agree with brain mri -continue cefazolin  -discussed with Dr Jonel

## 2024-06-24 NOTE — Anesthesia Procedure Notes (Signed)
 Procedure Name: MAC Date/Time: 06/24/2024 2:25 PM  Performed by: Viviana Almarie DASEN, CRNAPre-anesthesia Checklist: Patient identified, Suction available, Patient being monitored, Emergency Drugs available and Timeout performed Patient Re-evaluated:Patient Re-evaluated prior to induction Oxygen Delivery Method: Nasal cannula Induction Type: IV induction Placement Confirmation: positive ETCO2

## 2024-06-24 NOTE — Transfer of Care (Signed)
 Immediate Anesthesia Transfer of Care Note  Patient: Philip [name]  Procedure(s) Performed: TRANSESOPHAGEAL ECHOCARDIOGRAM  Patient Location: PACU and Cath Lab  Anesthesia Type:MAC  Level of Consciousness: patient cooperative  Airway & Oxygen Therapy: Patient Spontanous Breathing and Patient connected to nasal cannula oxygen  Post-op Assessment: Report given to RN, Post -op Vital signs reviewed and stable, Patient moving all extremities X 4, and Patient able to stick tongue midline  Post vital signs: Reviewed and stable  Last Vitals:  Vitals Value Taken Time  BP 96/63   Temp 98.3   Pulse 73 06/24/24 14:52  Resp 15 06/24/24 14:52  SpO2 93 % 06/24/24 14:52  Vitals shown include unfiled device data.  Last Pain:  Vitals:   06/24/24 1408  TempSrc: Temporal  PainSc: 0-No pain      Patients Stated Pain Goal: 0 (06/21/24 0746)  Complications: No notable events documented.

## 2024-06-24 NOTE — Anesthesia Postprocedure Evaluation (Signed)
 Anesthesia Post Note  Patient: Philip [name]  Procedure(s) Performed: TRANSESOPHAGEAL ECHOCARDIOGRAM     Patient location during evaluation: Cath Lab Anesthesia Type: MAC Level of consciousness: awake Pain management: pain level controlled Vital Signs Assessment: post-procedure vital signs reviewed and stable Respiratory status: spontaneous breathing, nonlabored ventilation and respiratory function stable Cardiovascular status: blood pressure returned to baseline and stable Postop Assessment: no apparent nausea or vomiting Anesthetic complications: no   No notable events documented.  Last Vitals:  Vitals:   06/24/24 1515 06/24/24 1520  BP: 126/68 132/73  Pulse: 73 73  Resp: (!) 21 (!) 22  Temp:    SpO2: 94% 93%    Last Pain:  Vitals:   06/24/24 1520  TempSrc:   PainSc: 0-No pain                 Irineo Gaulin P Lou Irigoyen

## 2024-06-24 NOTE — H&P (View-Only) (Signed)
  Progress Note  Patient Name: Philip [name] Date of Encounter: 06/24/2024 [facility] Cardiologist: Dorn Lesches, MD   Interval Summary   No acute overnight events. Denies chest pain, fevers, palpitations, or dyspnea.   Vital Signs Vitals:   06/23/24 1547 06/23/24 1945 06/23/24 2359 06/24/24 0405  BP: 132/65 136/73 129/67 135/78  Pulse: 74 87 69 74  Resp: 20 20 20 19   Temp: 97.6 F (36.4 C) 98.5 F (36.9 C) 98.3 F (36.8 C) 98.2 F (36.8 C)  TempSrc: Oral Oral Oral Oral  SpO2: 97% 98% 98% 95%  Weight:    104.2 kg  Height:        Intake/Output Summary (Last 24 hours) at 06/24/2024 1008 Last data filed at 06/24/2024 0916 Gross per 24 hour  Intake 60 ml  Output 1300 ml  Net -1240 ml      06/24/2024    4:05 AM 06/23/2024    1:35 AM 06/22/2024    3:27 AM  Last 3 Weights  Weight (lbs) 229 lb 11.5 oz 235 lb 3.7 oz 236 lb 8.9 oz  Weight (kg) 104.2 kg 106.7 kg 107.3 kg      Telemetry/ECG  SR  Physical Exam  General: Well developed, in no acute distress.  Neck: No JVD.  Cardiac: Normal rate, regular rhythm.  Resp: Normal work of breathing.  Ext: No edema.  Neuro: No gross focal deficits.  Psych: Normal affect.    Assessment & Plan  Atrial Fibrillation: Likely provoked by sepsis. Patient found to be in Afib with RVR on admission.  - Continue metoprolol  tartrate 50 mg BID. - Continue eliquis  5 mg BID.    NSTEMI: Per Dr. Lesches, suspect type II secondary to demand ischemia in setting of sepsis, AF w/ RVR and known CAD.  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  - Echo this admission with EF 50% - No plans for cath at this time per Dr. Lesches. No chest pain.  - Not on ASA due to Eliquis . - Continue metoprolol  tartrate. - Continue atorvastain 40 mg daily.   Sepsis  MSSA bacteremia - TEE later today.  If negative, will sign off and arrange Cardiology follow up.  For questions or updates, please contact [facility] Please consult [email or web address] for  contact info under       Signed, Riko Lumsden K Clinton Wahlberg, MD

## 2024-06-24 NOTE — Anesthesia Preprocedure Evaluation (Addendum)
 Anesthesia Evaluation  Patient identified by MRN, date of birth, ID band Patient awake    Reviewed: Allergy & Precautions, NPO status , Patient's Chart, lab work & pertinent test results  Airway Mallampati: II  TM Distance: >3 FB Neck ROM: Full    Dental no notable dental hx. (+) Teeth Intact, Dental Advisory Given   Pulmonary sleep apnea , former smoker   Pulmonary exam normal breath sounds clear to auscultation       Cardiovascular hypertension, Pt. on medications + CAD and + CABG  Normal cardiovascular exam+ dysrhythmias Atrial Fibrillation  Rhythm:Regular Rate:Normal  06/18/2024 TTE  1. Left ventricular ejection fraction, by estimation, is 50%. The left  ventricle has mildly decreased function. Left ventricular endocardial  border not optimally defined to evaluate regional wall motion. There is  moderate concentric left ventricular  hypertrophy. Indeterminate diastolic filling due to E-A fusion.   2. Right ventricular systolic function was not well visualized. The right  ventricular size is not well visualized. Tricuspid regurgitation signal is  inadequate for assessing PA pressure.   3. Left atrial size was mild to moderately dilated.   4. The mitral valve is degenerative. No evidence of mitral valve  regurgitation. Mild mitral stenosis. The mean mitral valve gradient is 4.6  mmHg with average heart rate of 99 bpm. Severe mitral annular  calcification.   5. The aortic valve is tricuspid. There is mild calcification of the  aortic valve. Aortic valve regurgitation is not visualized. Aortic valve  sclerosis/calcification is present, without any evidence of aortic  stenosis.      Neuro/Psych  PSYCHIATRIC DISORDERS  Depression       GI/Hepatic ,GERD  Medicated,,  Endo/Other  Hypothyroidism    Renal/GU Renal diseaseLab Results      Component                Value               Date                           K                         3.7                 06/24/2024                CO2                      24                  06/24/2024                BUN                      19                  06/24/2024                CREATININE               1.32 (H)            06/24/2024                GFRNONAA                 53 (L)  06/24/2024                   ALBUMIN                  2.4 (L)             06/21/2024                GLUCOSE                  108 (H)             06/24/2024                Musculoskeletal  (+) Arthritis ,    Abdominal   Peds  Hematology   Anesthesia Other Findings   Reproductive/Obstetrics                              Anesthesia Physical Anesthesia Plan  ASA: 3  Anesthesia Plan: MAC   Post-op Pain Management: Minimal or no pain anticipated   Induction:   PONV Risk Score and Plan: Treatment may vary due to age or medical condition and Propofol  infusion  Airway Management Planned: Nasal Cannula and Natural Airway  Additional Equipment: None  Intra-op Plan:   Post-operative Plan:   Informed Consent: I have reviewed the patients History and Physical, chart, labs and discussed the procedure including the risks, benefits and alternatives for the proposed anesthesia with the patient or authorized representative who has indicated his/her understanding and acceptance.     Dental advisory given  Plan Discussed with: CRNA and Surgeon  Anesthesia Plan Comments:         Anesthesia Quick Evaluation

## 2024-06-24 NOTE — Interval H&P Note (Signed)
 History and Physical Interval Note:  06/24/2024 2:24 PM  Philip [name]  has presented today for surgery, with the diagnosis of Bacteremia.  The various methods of treatment have been discussed with the patient and family. After consideration of risks, benefits and other options for treatment, the patient has consented to  Procedure(s): TRANSESOPHAGEAL ECHOCARDIOGRAM (N/A) as a surgical intervention.  The patient's history has been reviewed, patient examined, no change in status, stable for surgery.  I have reviewed the patient's chart and labs.  Questions were answered to the patient's satisfaction.     Anija Brickner A Rohan Juenger

## 2024-06-24 NOTE — Progress Notes (Signed)
  Progress Note  Patient Name: Philip [name] Date of Encounter: 06/24/2024 [facility] Cardiologist: Dorn Lesches, MD   Interval Summary   No acute overnight events. Denies chest pain, fevers, palpitations, or dyspnea.   Vital Signs Vitals:   06/23/24 1547 06/23/24 1945 06/23/24 2359 06/24/24 0405  BP: 132/65 136/73 129/67 135/78  Pulse: 74 87 69 74  Resp: 20 20 20 19   Temp: 97.6 F (36.4 C) 98.5 F (36.9 C) 98.3 F (36.8 C) 98.2 F (36.8 C)  TempSrc: Oral Oral Oral Oral  SpO2: 97% 98% 98% 95%  Weight:    104.2 kg  Height:        Intake/Output Summary (Last 24 hours) at 06/24/2024 1008 Last data filed at 06/24/2024 0916 Gross per 24 hour  Intake 60 ml  Output 1300 ml  Net -1240 ml      06/24/2024    4:05 AM 06/23/2024    1:35 AM 06/22/2024    3:27 AM  Last 3 Weights  Weight (lbs) 229 lb 11.5 oz 235 lb 3.7 oz 236 lb 8.9 oz  Weight (kg) 104.2 kg 106.7 kg 107.3 kg      Telemetry/ECG  SR  Physical Exam  General: Well developed, in no acute distress.  Neck: No JVD.  Cardiac: Normal rate, regular rhythm.  Resp: Normal work of breathing.  Ext: No edema.  Neuro: No gross focal deficits.  Psych: Normal affect.    Assessment & Plan  Atrial Fibrillation: Likely provoked by sepsis. Patient found to be in Afib with RVR on admission.  - Continue metoprolol  tartrate 50 mg BID. - Continue eliquis  5 mg BID.    NSTEMI: Per Dr. Lesches, suspect type II secondary to demand ischemia in setting of sepsis, AF w/ RVR and known CAD.  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  - Echo this admission with EF 50% - No plans for cath at this time per Dr. Lesches. No chest pain.  - Not on ASA due to Eliquis . - Continue metoprolol  tartrate. - Continue atorvastain 40 mg daily.   Sepsis  MSSA bacteremia - TEE later today.  If negative, will sign off and arrange Cardiology follow up.  For questions or updates, please contact [facility] Please consult [email or web address] for  contact info under       Signed, Riko Lumsden K Clinton Wahlberg, MD

## 2024-06-25 ENCOUNTER — Inpatient Hospital Stay (HOSPITAL_COMMUNITY)

## 2024-06-25 DIAGNOSIS — N183 Chronic kidney disease, stage 3 unspecified: Secondary | ICD-10-CM

## 2024-06-25 DIAGNOSIS — R410 Disorientation, unspecified: Secondary | ICD-10-CM

## 2024-06-25 DIAGNOSIS — I76 Septic arterial embolism: Secondary | ICD-10-CM

## 2024-06-25 DIAGNOSIS — I714 Abdominal aortic aneurysm, without rupture, unspecified: Secondary | ICD-10-CM

## 2024-06-25 DIAGNOSIS — B9561 Methicillin susceptible Staphylococcus aureus infection as the cause of diseases classified elsewhere: Secondary | ICD-10-CM | POA: Diagnosis not present

## 2024-06-25 DIAGNOSIS — Z7901 Long term (current) use of anticoagulants: Secondary | ICD-10-CM

## 2024-06-25 DIAGNOSIS — I63443 Cerebral infarction due to embolism of bilateral cerebellar arteries: Secondary | ICD-10-CM

## 2024-06-25 DIAGNOSIS — R29705 NIHSS score 5: Secondary | ICD-10-CM

## 2024-06-25 DIAGNOSIS — R7881 Bacteremia: Secondary | ICD-10-CM | POA: Diagnosis not present

## 2024-06-25 DIAGNOSIS — I3489 Other nonrheumatic mitral valve disorders: Secondary | ICD-10-CM

## 2024-06-25 DIAGNOSIS — I2510 Atherosclerotic heart disease of native coronary artery without angina pectoris: Secondary | ICD-10-CM

## 2024-06-25 DIAGNOSIS — A4101 Sepsis due to Methicillin susceptible Staphylococcus aureus: Secondary | ICD-10-CM | POA: Diagnosis not present

## 2024-06-25 DIAGNOSIS — I6340 Cerebral infarction due to embolism of unspecified cerebral artery: Secondary | ICD-10-CM | POA: Diagnosis not present

## 2024-06-25 DIAGNOSIS — I6523 Occlusion and stenosis of bilateral carotid arteries: Secondary | ICD-10-CM | POA: Diagnosis not present

## 2024-06-25 DIAGNOSIS — I672 Cerebral atherosclerosis: Secondary | ICD-10-CM | POA: Diagnosis not present

## 2024-06-25 DIAGNOSIS — I330 Acute and subacute infective endocarditis: Secondary | ICD-10-CM

## 2024-06-25 LAB — COMPREHENSIVE METABOLIC PANEL WITH GFR
ALT: 25 U/L (ref 0–44)
AST: 67 U/L — ABNORMAL HIGH (ref 15–41)
Albumin: 3 g/dL — ABNORMAL LOW (ref 3.5–5.0)
Alkaline Phosphatase: 44 U/L (ref 38–126)
Anion gap: 11 (ref 5–15)
BUN: 17 mg/dL (ref 8–23)
CO2: 26 mmol/L (ref 22–32)
Calcium: 9.1 mg/dL (ref 8.9–10.3)
Chloride: 99 mmol/L (ref 98–111)
Creatinine, Ser: 1.17 mg/dL (ref 0.61–1.24)
GFR, Estimated: >60 mL/min (ref >60)
Glucose, Bld: 104 mg/dL — ABNORMAL HIGH (ref 70–99)
Potassium: 4.1 mmol/L (ref 3.5–5.1)
Sodium: 136 mmol/L (ref 135–145)
Total Bilirubin: 0.8 mg/dL (ref 0.0–1.2)
Total Protein: 5.9 g/dL — ABNORMAL LOW (ref 6.5–8.1)

## 2024-06-25 LAB — CBC
HCT: 35.5 % — ABNORMAL LOW (ref 39.0–52.0)
Hemoglobin: 12.3 g/dL — ABNORMAL LOW (ref 13.0–17.0)
MCH: 32.2 pg (ref 26.0–34.0)
MCHC: 34.6 g/dL (ref 30.0–36.0)
MCV: 92.9 fL (ref 80.0–100.0)
Platelets: 183 K/uL (ref 150–400)
RBC: 3.82 MIL/uL — ABNORMAL LOW (ref 4.22–5.81)
RDW: 14.2 % (ref 11.5–15.5)
WBC: 6.7 K/uL (ref 4.0–10.5)
nRBC: 0 % (ref 0.0–0.2)

## 2024-06-25 LAB — HEMOGLOBIN A1C
Hgb A1c MFr Bld: 5.9 % — ABNORMAL HIGH (ref 4.8–5.6)
Mean Plasma Glucose: 122.63 mg/dL

## 2024-06-25 MED ORDER — GADOBUTROL 1 MMOL/ML IV SOLN
10.0000 mL | Freq: Once | INTRAVENOUS | Status: AC | PRN
  Administered 2024-06-25: 10 mL via INTRAVENOUS

## 2024-06-25 MED ORDER — IOHEXOL 350 MG/ML SOLN
75.0000 mL | Freq: Once | INTRAVENOUS | Status: AC | PRN
  Administered 2024-06-25: 75 mL via INTRAVENOUS

## 2024-06-25 MED ORDER — ENSURE PLUS HIGH PROTEIN PO LIQD
237.0000 mL | Freq: Two times a day (BID) | ORAL | Status: DC
  Administered 2024-06-25 – 2024-06-27 (×5): 237 mL via ORAL

## 2024-06-25 MED ORDER — AMLODIPINE BESYLATE 5 MG PO TABS
5.0000 mg | ORAL_TABLET | Freq: Every day | ORAL | Status: DC
  Administered 2024-06-26 – 2024-06-27 (×2): 5 mg via ORAL
  Filled 2024-06-25 (×2): qty 1

## 2024-06-25 MED ORDER — CEFAZOLIN SODIUM-DEXTROSE 2-4 GM/100ML-% IV SOLN
2.0000 g | Freq: Four times a day (QID) | INTRAVENOUS | Status: DC
  Administered 2024-06-25 – 2024-06-27 (×9): 2 g via INTRAVENOUS
  Filled 2024-06-25 (×9): qty 100

## 2024-06-25 NOTE — Progress Notes (Signed)
 Occupational Therapy Treatment Patient Details Name: Philip [name] MRN: [id number] DOB: 25-May-1941 Today's Date: 06/25/2024   History of present illness 83 yr old male who presented 06/17/24 due to wondering a parking lot. Pt recently had ear sx with graft from the neck. Pt with sepsis due to MSSA bacteremia from neck wound. Pt also with encephalopathy. MRI (+) Bil cerebral cerebellar and L pons infarct TEE (+) vegetation mass  PMH: afib, CAD s/p CABG, PAD, OSA, AAA stenting, penile prothesis   OT comments  Pt completes two visual assessments with correct data with slight bias to the L. Pt more alert and responsive to questions. Pt engaged in conversation but with intermittent eye contact. Recommendation for Patient will benefit from intensive inpatient follow-up therapy, >3 hours/day       If plan is discharge home, recommend the following:  A lot of help with walking and/or transfers;Two people to help with bathing/dressing/bathroom   Equipment Recommendations  BSC/3in1;Other (comment)    Recommendations for Other Services Rehab consult    Precautions / Restrictions Precautions Precautions: Fall       Mobility Bed Mobility Overal bed mobility: Needs Assistance Bed Mobility: Rolling, Supine to Sit Rolling: Contact guard assist   Supine to sit: Contact guard     General bed mobility comments: exiting Left side with use of bedrail and hob 45 degrees    Transfers Overall transfer level: Needs assistance   Transfers: Sit to/from Stand, Bed to chair/wheelchair/BSC Sit to Stand: Contact guard assist                 Balance Overall balance assessment: Needs assistance Sitting-balance support: No upper extremity supported, Feet supported Sitting balance-Leahy Scale: Good     Standing balance support: No upper extremity supported, During functional activity Standing balance-Leahy Scale: Fair                             ADL either performed or assessed  with clinical judgement   ADL Overall ADL's : Needs assistance/impaired                         Toilet Transfer: Supervision/safety;Stand-pivot             General ADL Comments: vision assessment provide to patient. pt completed visual scanning with correct locating R to L. pt with correct close with a sligh L bias to drawing. see ( media)    Extremity/Trunk Assessment Upper Extremity Assessment Upper Extremity Assessment: Right hand dominant;RUE deficits/detail RUE Deficits / Details: noted to have a tremor to writing and pt reports this is normal   Lower Extremity Assessment Lower Extremity Assessment: Overall WFL for tasks assessed        Vision   Vision Assessment?: Wears glasses for reading   Perception     Praxis     Communication     Cognition Arousal: Alert Behavior During Therapy: Flat affect Cognition: Cognition impaired   Orientation impairments: Time, Place Awareness: Intellectual awareness intact, Online awareness impaired       OT - Cognition Comments: pt reports correctly he has had a stroke and he states my brain apparently isnt working the way it should which i wish i understood more                 Following commands: Intact        Cueing      Exercises  Shoulder Instructions       General Comments      Pertinent Vitals/ Pain       Pain Assessment Pain Assessment: No/denies pain  Home Living                                          Prior Functioning/Environment              Frequency  Min 2X/week        Progress Toward Goals  OT Goals(current goals can now be found in the care plan section)  Progress towards OT goals: Progressing toward goals  Acute Rehab OT Goals Patient Stated Goal: to better understand what is happening with his health OT Goal Formulation: With patient/family Time For Goal Achievement: 07/03/24 Potential to Achieve Goals: Good ADL Goals Pt Will  Perform Eating: with set-up;sitting Pt Will Perform Grooming: with set-up;sitting Pt Will Perform Upper Body Bathing: with contact guard assist;sitting Pt Will Transfer to Toilet: with min assist;stand pivot transfer;bedside commode Additional ADL Goal #1: pt will demonstrate bed mobility supervision as precursor to adls.  Plan      Co-evaluation                 AM-PAC OT 6 Clicks Daily Activity     Outcome Measure   Help from another person eating meals?: A Little Help from another person taking care of personal grooming?: A Little Help from another person toileting, which includes using toliet, bedpan, or urinal?: A Lot Help from another person bathing (including washing, rinsing, drying)?: A Lot Help from another person to put on and taking off regular upper body clothing?: A Little Help from another person to put on and taking off regular lower body clothing?: A Lot 6 Click Score: 15    End of Session    OT Visit Diagnosis: Unsteadiness on feet (R26.81);Muscle weakness (generalized) (M62.81)   Activity Tolerance Patient tolerated treatment well   Patient Left in chair;with call bell/phone within reach;with family/visitor present (brother)   Nurse Communication Mobility status;Precautions        Time: [phone] OT Time Calculation (min): 16 min  Charges: OT General Charges $OT Visit: 1 Visit OT Treatments $Self Care/Home Management : 8-22 mins   Brynn, OTR/L  Acute Rehabilitation Services Office: [phone] .   Philip [name] 06/25/2024, 10:01 AM

## 2024-06-25 NOTE — Progress Notes (Signed)
   Inpatient Rehabilitation Admissions Coordinator   Patient is supervision to contact guard assist 450 feet 8/25. He is not in need of CIR level rehab at this time. Other rehab venues should be pursued Acute team and TOC made aware.  Heron Leavell, RN, MSN Rehab Admissions Coordinator [phone] 06/25/2024 11:34 AM

## 2024-06-25 NOTE — Plan of Care (Signed)
  Problem: Education: Goal: Knowledge of General Education information will improve Description: Including pain rating scale, medication(s)/side effects and non-pharmacologic comfort measures Outcome: Progressing   Problem: Clinical Measurements: Goal: Will remain free from infection Outcome: Progressing   Problem: Skin Integrity: Goal: Risk for impaired skin integrity will decrease Outcome: Progressing   

## 2024-06-25 NOTE — Progress Notes (Signed)
 Pharmacy Antimicrobial Stewardship Note  79 YOM found to have MSSA bacteremia seeded from recent ear skin grafting and TEE showing mitral valve endocarditis. MRI results are concerning for CNS septic emboli.  Will adjust Cefazolin  to q6h dosing to target CNS coverage - plan for at least 2 weeks at the higher dosing.   Plan - Adjust Cefazolin  to 2g IV every 6 hours - Will monitor additional work-up, renal function, and plans for LOT  Thank you for allowing pharmacy to be a part of this patient's care.  Almarie Lunger, PharmD, BCPS, BCIDP Infectious Diseases Clinical Pharmacist 06/25/2024 12:32 PM   **Pharmacist phone directory can now be found on [email or web address] ([email or web address]).  Listed under [facility].

## 2024-06-25 NOTE — Progress Notes (Addendum)
 Progress Note  Patient Name: Philip [name] Date of Encounter: 06/25/2024 [facility] Cardiologist: Dorn Lesches, MD   Interval Summary    Sitting up in the chair with family at bedside. Feels overwhelmed, still confused regarding events.   Vital Signs Vitals:   06/24/24 2141 06/25/24 0048 06/25/24 0358 06/25/24 0750  BP: (!) 159/88 126/60 127/64 (!) 147/69  Pulse: 77 74 75 80  Resp:  20 19 20   Temp:  97.9 F (36.6 C) 97.9 F (36.6 C) 98.3 F (36.8 C)  TempSrc:  Oral Oral Oral  SpO2:  98% 97% 98%  Weight:   100.4 kg   Height:        Intake/Output Summary (Last 24 hours) at 06/25/2024 0952 Last data filed at 06/25/2024 0710 Gross per 24 hour  Intake 250 ml  Output 1500 ml  Net -1250 ml      06/25/2024    3:58 AM 06/24/2024    4:05 AM 06/23/2024    1:35 AM  Last 3 Weights  Weight (lbs) 221 lb 5.5 oz 229 lb 11.5 oz 235 lb 3.7 oz  Weight (kg) 100.4 kg 104.2 kg 106.7 kg      Telemetry/ECG  N/a - Personally Reviewed  Physical Exam  GEN: No acute distress.   Neck: No JVD Cardiac: RRR, no murmurs, rubs, or gallops.  Respiratory: Clear to auscultation bilaterally. GI: Soft, nontender, non-distended  MS: No edema  Assessment & Plan   83 y.o. male with a hx of HTN, HLD, CAD s/p CABG (1999, [facility]), AAA s/p stenting ([facility], 2015), PAF on eliquis , CKD III who was seen 06/18/2024 for the evaluation of Afib/flutter with RVR and sepsis at the request of Dr. Darci.   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 1.8-1.2 cm mass on mitral valve, mobile vegetation, mild MS, mild  to moderate MR and possible perforation at the site of vegetation -- seen by CT surgery and felt to be a poor candidate/high risk for surgery -- ID following  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 -- Brain MRI with concern for acute to early subacute ischemic infarcts within the bilateral cerebral and cerebellar hemispheres concerning for septic emboli -- neurology following, need to ensure ok to continue on Eliquis    For questions or updates, please contact [facility] Please consult [email or web address] for contact info under       Signed, Manuelita Rummer, NP   ATTENDING ATTESTATION:  After conducting a review of all available clinical information with the care team, interviewing the patient, and performing a physical exam, I agree with the findings and plan described in this note.   GEN: No acute distress.   HEENT:  MMM, no JVD, no scleral icterus Cardiac: RRR, no significant murmurs, rubs, or gallops.  Respiratory: Clear to auscultation bilaterally. GI: Soft, nontender, non-distended  MS: No edema; No deformity. Neuro:  Nonfocal; less confused versus yesterday Vasc:  +2 radial pulses  No acute events overnight.  Patient's TEE yesterday demonstrated possible perforated mitral valve and mitral valve endocarditis.  Due to mental status changes and MRI was  performed which demonstrated bilateral scattered embolic phenomenon and neurology was consulted.  Today the patient is less confused.  He remains in normal rhythm based on exam.  He was seen by cardiothoracic surgery and considered a high risk surgical candidate and therefore surgical intervention will be deferred.  I spoke with neurology team at bedside who indicated no concrete titrations to chronic anticoagulation.  Cardiology issues seem to be stable.  The patient is not in acute heart failure.  Will continue current management.  Cardiology will sign off.  Call with questions.  Lurena Red, MD Pager [phone]

## 2024-06-25 NOTE — Progress Notes (Signed)
 Progress Note   Patient: Philip [name] FMW:[id number] DOB: 03/18/1941 DOA: 06/17/2024     7 DOS: the patient was seen and examined on 06/25/2024 at 8:52AM and 4:45PM      Brief hospital course: 83 y.o. 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     Assessment and Plan: Sepsis due to native mitral valve MSSA endocarditis Presented with confusion, fever, tachycardia, AKI, thrombocytopenia, and hypoxia requiring 4 L nasal cannula, all found to be due to MSSA bacteremia.  ID consulted.  TTE obtained and unremarkable but TEE showed 1.8x1.2 cm vegetation on mitral valve.  Given size of vegetation, cardiothoracic surgery was consulted, but recommended against surgery given surgical risk. 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  Neurology consulted.  LDL 25 last month, A1c <6%.  MRI findings communicated to Dr. Kerrin 8/26 - Continue Eliquis  - tPA not given because outside window - Dysphagia screen passed - PT eval ordered:  planning HHPT - Nonsmoker  - Follow-up CT angiogram head and neck - Consult Neurology   Left neck wound Had a recent skin graft for a Mohs surgery on left ear.  This developed a hematoma, which is resolving.  Unfortunately, the skin is now dehiscing a little bit.  I discussed this with ENT on 8/25, who were not asked to see the patient.  Given shallow depth of wound, this is low risk, will have to heal by secondary intention - Xeroform, change twice daily - Follow up with Dermatology Dr. Robina - Will need [facility] for wound care   Paroxysmal atrial fibrillation Stable - Continue Eliquis   and metoprolol    Acute metabolic encephalopathy At baseline the patient is cognitively intact has no memory impairment.  At present he has cognitive delay, persistent confusion, persistent memory impairment.   Coronary artery disease s/p CABG 1999 at [facility] Demand ischemia Peripheral vascular disease s/p AAA stent Mildly elevated troponin on admission.  Cardiology consulted, no ischemic work up planned given situation.  BP trending up - Continue metoprolol  and Lipitor - Resume amlodipine  - Hold losartan , hydrochlorothiazide   Thrombocytopenia Normalized  AKI on CKD stage IIIa Creatinine 1.9 at the time of admission, improved to 1.1-1.3 which is his baseline      Subjective: Feels about the same.  No change to left neck wound.  No fever, no respiratory symptoms, no chest pain.       Physical Exam: BP (!) 152/77 (BP Location: Left Arm)   Pulse 73   Temp 97.6 F (36.4 C) (Oral)   Resp 18   Ht 5' 9 (1.753 m)   Wt 100.4 kg   SpO2 97%   BMI 32.69 kg/m   Adult male, sitting up in recliner, interactive and appropriate RRR, no murmurs appreciated, mild anasarca but no pitting anywhere Respiratory rate normal, lungs diminished but no rales or wheezes appreciated Abdomen soft, tenderness palpation Makes eye contact, oriented to self, wife, hospital, but psychomotor slowing is obvious, short-term memory seems impaired.  He has generalized weakness, upper extremity strength seems 5/5 and symmetric but lower extremity weakness is worse on the right, 3/5 in the left, 2/5 on the right.  Data Reviewed: Discussed with cardiology and neurology Basic metabolic panel shows normal electrolytes and renal function CBC shows no leukocytosis, mild anemia, stable     Family Communication: Wife at the bedside    Disposition: Status is: Inpatient 83 y.o. M with chronic kidney disease, vascular disease, A-fib on Eliquis  presented with MSSA mitral valve endocarditis with  septic emboli to the brain  He is gradually improving, will need arrangements for outpatient parenteral antibiotics, finalization of antibiotic regimen from ID, and plans for left neck wound        Author: Lonni SHAUNNA Dalton, MD 06/25/2024 4:52 PM  For on call review [email or web address].

## 2024-06-25 NOTE — Consult Note (Addendum)
 NEUROLOGY CONSULT NOTE   Date of service: June 25, 2024 Patient Name: Philip [name] MRN:  [id number] DOB:  [birth year] Chief Complaint: Septic emboli Requesting Provider: Jonel Lonni SQUIBB, *  History of Present Illness  Philip [name] is a 83 y.o. male with hx of CAD status post CABG in 1999, paroxysmal atrial fibrillation on Eliquis , stent grafting for AAA in 2014, hyperlipidemia who presented to the emergency department on 06/17/2024 after being found confused in a parking lot.  EMS was called by bystanders and patient found to be tachycardic and hypoxic.  Of note he recently had Mohs surgery on his left ear.  When he arrived to the emergency room he was febrile at 50 F as well as hypoxic requiring 4 L of nasal cannula oxygen.  Patient was admitted for further evaluation and management for acute encephalopathy.  Patient was initially started on Zosyn  and vancomycin  and was admitted for further evaluation and management.  During his hospital course patient developed atrial fibrillation with RVR requiring diltiazem .  Cardiology was consulted due to elevated troponin and patient was started on heparin  drip.  Thought sepsis was the cause of atrial fibrillation with RVR and elevated troponin from demand.  Patient is now rate controlled with metoprolol  tartrate 50 mg twice daily. Blood cultures on admission started to grow MSSA and ID is now involved.  Antibiotics de-escalated to cefazolin  on 06/18/2024.  During course patient had TTE which not show evidence of endocarditis, but then had TEE which showed evidence of vegetation on atrial aspect of mitral valve concerning for endocarditis on 06/24/2024.  Cardiothoracic surgery consulted stating risk of surgery is too high in this patient.  Patient had altered mental status yesterday which worsened prompting MRI brain without contrast revealing concern for acute to early subacute ischemic infarcts within the bilateral cerebral and cerebellar  hemispheres concerning for septic emboli.  Neurology consulted for further evaluation and management.  Patient evaluated at bedside this morning.  Patient does still seem to be very confused.  He is alert and oriented x 2.  He is unable to tell me why he is here and does not know what is going on.  I tried to explain this to him, and asked him to repeat back to me what he understood, and he was not able to repeat.  He does continually repeat that I am confused about why I am here.  He denies any weakness, trouble speaking, trouble swallowing, sensation loss, visual field deficits, or any other deficits.  Discussed plan with patient's brother, Nicholaus, at bedside.  LKW: 06/17/2024 Modified rankin score: 3-Moderate disability-requires help but walks WITHOUT assistance IV Thrombolysis: No EVT: No  NIHSS components Score: Comment  1a Level of Conscious 0[x]  1[]  2[]  3[]      1b LOC Questions 0[]  1[]  2[x]       1c LOC Commands 0[x]  1[]  2[]       2 Best Gaze 0[x]  1[]  2[]       3 Visual 0[x]  1[]  2[]  3[]      4 Facial Palsy 0[]  1[x]  2[]  3[]      5a Motor Arm - left 0[x]  1[]  2[]  3[]  4[]  UN[]    5b Motor Arm - Right 0[x]  1[]  2[]  3[]  4[]  UN[]    6a Motor Leg - Left 0[x]  1[]  2[]  3[]  4[]  UN[]    6b Motor Leg - Right 0[]  1[x]  2[]  3[]  4[]  UN[]    7 Limb Ataxia 0[x]  1[]  2[]  UN[]      8 Sensory 0[x]  1[]  2[]  UN[]   9 Best Language 0[]  1[x]  2[]  3[]      10 Dysarthria 0[x]  1[]  2[]  UN[]      11 Extinct. and Inattention 0[x]  1[]  2[]       TOTAL:   5      ROS   Neuro: He denies any weakness, trouble speaking, trouble swallowing, trouble finding words, sensation loss, visual field deficits, or any other deficits  Past History   Past Medical History:  Diagnosis Date   AAA (abdominal aortic aneurysm) (HCC)    Arthritis    Atrial fibrillation (HCC)    CAD (coronary artery disease)    had a MI , s/p CABG   Cataracts, bilateral    immature   CHF (congestive heart failure) (HCC)    Dysrhythmia    paroximal a fib    GERD (gastroesophageal reflux disease)    takes Omeprazole  daily   History of colon polyps    History of kidney stones    History of shingles    Hyperlipidemia    Hypertension    takes Amlodipine ,Metoprolol ,and Lisinopril  daily   Joint pain    Joint swelling    Myocardial infarction (HCC)    several with last one being 2001(but never knew about any except 2001)   OSA (obstructive sleep apnea)    started CPAP 12/09   Peripheral vascular disease (HCC)    Peyronie's disease    s/p penile implant in 2006   Pneumonia    as a child   Skin cancer    several , one of them was melanoma (sees derm routinely)   Sleep apnea    Tingling    feet occasionally   Tinnitus     Past Surgical History:  Procedure Laterality Date   ABDOMINAL AORTAGRAM N/A 06/16/2014   Procedure: ABDOMINAL EZELLA;  Surgeon: Lonni GORMAN Blade, MD;  Location: [facility] CATH LAB;  Service: Cardiovascular;  Laterality: N/A;   ABDOMINAL AORTIC ENDOVASCULAR STENT GRAFT N/A 07/08/2014   Procedure: ABDOMINAL AORTIC ENDOVASCULAR STENT GRAFT/ GORE;  Surgeon: Lonni GORMAN Blade, MD;  Location: [facility] OR;  Service: Vascular;  Laterality: N/A;   CARDIAC CATHETERIZATION  10/31/2013   COLONOSCOPY     CORONARY ARTERY BYPASS GRAFT  10/31/1997   x 3 vessels   LEFT HEART CATH AND CORS/GRAFTS ANGIOGRAPHY N/A 10/07/2021   Procedure: LEFT HEART CATH AND CORS/GRAFTS ANGIOGRAPHY;  Surgeon: Verlin Lonni BIRCH, MD;  Location: [facility] INVASIVE CV LAB;  Service: Cardiovascular;  Laterality: N/A;   LEFT HEART CATHETERIZATION WITH CORONARY/GRAFT ANGIOGRAM N/A 05/29/2014   Procedure: LEFT HEART CATHETERIZATION WITH EL BILE;  Surgeon: Ezra GORMAN Shuck, MD;  Location: [facility] CATH LAB;  Service: Cardiovascular;  Laterality: N/A;   PENILE PROSTHESIS IMPLANT  08/31/2005   AMS inflatable penile prosthesis   RHINOPLASTY  10/31/1973   TRANSESOPHAGEAL ECHOCARDIOGRAM (CATH LAB) N/A 06/24/2024   Procedure: TRANSESOPHAGEAL ECHOCARDIOGRAM;   Surgeon: Loni Soyla LABOR, MD;  Location: [facility] INVASIVE CV LAB;  Service: Cardiovascular;  Laterality: N/A;    Family History: Family History  Problem Relation Age of Onset   Prostate cancer Father 10   Heart disease Father    Skin cancer Father    Heart attack Father    Cancer Father    Deep vein thrombosis Father    Hyperlipidemia Father    Hypertension Father    Heart disease Mother    Skin cancer Mother    Heart attack Mother    Cancer Mother    Hyperlipidemia Mother    Hypertension Mother    Varicose  Veins Mother    Peripheral vascular disease Mother        amputation   Sleep apnea Brother    Hyperlipidemia Brother    Hypertension Brother    Prostate cancer Brother 90   Colon cancer Other        aunt   Skin cancer Brother    Skin cancer Sister    Cancer Sister    Heart disease Sister    Diabetes Neg Hx    Stroke Neg Hx     Social History  reports that he quit smoking about 46 years ago. His smoking use included cigarettes. He has never used smokeless tobacco. He reports current alcohol use. He reports that he does not use drugs.  No Known Allergies  Medications   Current Facility-Administered Medications:    acetaminophen  (TYLENOL ) tablet 650 mg, 650 mg, Oral, Q6H PRN, 650 mg at 06/19/24 1346 **OR** acetaminophen  (TYLENOL ) suppository 650 mg, 650 mg, Rectal, Q6H PRN, Pokhrel, Laxman, MD   apixaban  (ELIQUIS ) tablet 5 mg, 5 mg, Oral, BID, Henry Shaver B, NP, 5 mg at 06/24/24 2141   atorvastatin  (LIPITOR) tablet 40 mg, 40 mg, Oral, Daily, Pokhrel, Laxman, MD, 40 mg at 06/23/24 0932   ceFAZolin  (ANCEF ) IVPB 2g/100 mL premix, 2 g, Intravenous, Q6H, Gladis Almarie PARAS, [facility]   HYDROcodone -acetaminophen  (NORCO/VICODIN) 5-325 MG per tablet 1-2 tablet, 1-2 tablet, Oral, Q4H PRN, Pokhrel, Laxman, MD, 2 tablet at 06/24/24 1559   metoprolol  tartrate (LOPRESSOR ) tablet 50 mg, 50 mg, Oral, BID, Johnson, Kathleen R, PA-C, 50 mg at 06/24/24 2141   multivitamin with minerals  tablet 1 tablet, 1 tablet, Oral, Daily, Pokhrel, Laxman, MD, 1 tablet at 06/23/24 0932   polyethylene glycol (MIRALAX  / GLYCOLAX ) packet 17 g, 17 g, Oral, Daily PRN, Pokhrel, Laxman, MD   polyethylene glycol (MIRALAX  / GLYCOLAX ) packet 17 g, 17 g, Oral, Daily, Pokhrel, Laxman, MD, 17 g at 06/23/24 0932   sodium chloride  flush (NS) 0.9 % injection 3 mL, 3 mL, Intravenous, Q12H, Pokhrel, Laxman, MD, 3 mL at 06/24/24 2146   sodium chloride  flush (NS) 0.9 % injection 3 mL, 3 mL, Intravenous, PRN, Pokhrel, Laxman, MD   white petrolatum  (VASELINE) gel, , Topical, Daily, Jonel Lonni SQUIBB, MD, Given at 06/24/24 0947  Vitals   Vitals:   06/24/24 2141 06/25/24 0048 06/25/24 0358 06/25/24 0750  BP: (!) 159/88 126/60 127/64 (!) 147/69  Pulse: 77 74 75 80  Resp:  20 19 20   Temp:  97.9 F (36.6 C) 97.9 F (36.6 C) 98.3 F (36.8 C)  TempSrc:  Oral Oral Oral  SpO2:  98% 97% 98%  Weight:   100.4 kg   Height:        Body mass index is 32.69 kg/m.   Physical Exam   Constitutional: Resting in bed, no acute distress Psych: Affect appropriate to situation.  Eyes: EOMI, visual field intact, PERRL HENT: No OP obstruction.  No palate deviation appreciated Head: Normocephalic, does have healing incision sites noted to left ear, and left neck Cardiovascular: Bradycardic, no murmurs auscultated Respiratory: Effort normal, non-labored breathing. Skin: Healing surgical incisions noted to left ear and left neck  Neurologic Examination    Physical Exam Gen: A&Ox2 (name and place, but not time or situation)  Neuro: *MS: A&O x2. 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 *CN:    I: Deferred   II,III: PERRLA, VFF by confrontation, optic discs  not visualized 2/2 pupillary constriction   III,IV,VI: EOMI w/o nystagmus, no ptosis   V: Sensation intact from V1 to V3 to LT   VII: Eyelid closure  was full.  Smile symmetric.   VIII: Hearing intact to voice   IX,X: Voice normal, palate elevates symmetrically    XI: SCM/trap 5/5 bilat   XII: Tongue protrudes midline, no atrophy or fasciculations  *Motor:   Normal bulk.  No tremor, rigidity or bradykinesia. No pronator drift.   Strength: Dlt Bic Tri WE WrF FgS Gr HF KnF KnE PlF DoF    Left 5 5 5 5 5 5 5 4 5 5 5 5     Right 5 5 5 5 5 5 5 3 5 5 5 5    *Sensory: Intact to light touch, proprioception intact *Coordination: Right finger-nose test more discoordinated than left.  Right heel-to-shin appropriate with some discoordination appreciated.  Left heel-to-shin appropriate. *Reflexes:  2+ brachial radialis reflexes, unable to elicit patellar reflexes *Gait: deferred   Labs/Imaging/Neurodiagnostic studies   CBC:  Recent Labs  Lab 07/06/24 0251 06/25/24 0329  WBC 7.1 6.7  HGB 12.2* 12.3*  HCT 35.2* 35.5*  MCV 91.9 92.9  PLT 180 183   Basic Metabolic Panel:  Lab Results  Component Value Date   NA 136 06/25/2024   K 4.1 06/25/2024   CO2 26 06/25/2024   GLUCOSE 104 (H) 06/25/2024   BUN 17 06/25/2024   CREATININE 1.17 06/25/2024   CALCIUM  9.1 06/25/2024   GFRNONAA >60 06/25/2024   GFRAA 93 09/09/2020   Lipid Panel:  Lab Results  Component Value Date   LDLCALC 25 05/22/2024   HgbA1c:  Lab Results  Component Value Date   HGBA1C 6.0 02/07/2024   Urine Drug Screen: No results found for: LABOPIA, COCAINSCRNUR, LABBENZ, AMPHETMU, THCU, LABBARB  Alcohol Level No results found for: [facility] INR  Lab Results  Component Value Date   INR 1.5 (H) 06/18/2024   APTT  Lab Results  Component Value Date   APTT 72 (H) 06/20/2024   AED levels: No results found for: PHENYTOIN, ZONISAMIDE, LAMOTRIGINE, LEVETIRACETA  CT Head without contrast(Personally reviewed): 06/17/2024 IMPRESSION: 1. No acute intracranial process.  MRI Brain(Personally reviewed): 07-06-24 IMPRESSION: 1. Multiple scattered  subcentimeter acute to early subacute ischemic infarcts involving the bilateral cerebral and cerebellar hemispheres as well as the left pons. No associated hemorrhage or significant mass effect. Given the various vascular distributions involved, a central thromboembolic etiology is likely. 2. Underlying mild chronic microvascular ischemic disease.   ASSESSMENT   Philip [name] is a 83 y.o. male with hx of CAD status post CABG in 1999, paroxysmal atrial fibrillation on Eliquis , stent grafting for AAA in 2014, hyperlipidemia who presented to the emergency department on 06/17/2024 after being found confused in a parking lot.  Course complicated by MSSA bacteremia with endocarditis now on cefazolin .  MRI brain without contrast on 2024-07-06 revealing concern for acute to early subacute ischemic infarcts within the bilateral cerebral and cerebellar hemispheres concerning for septic emboli.  Neurology consulted for further evaluation and management.  Patient likely has shower of septic emboli secondary to endocarditis of mitral valve.  Patient is already on Eliquis .  Most recent LDL on 05/22/2024 showing 25.  This is at goal.  Most recent A1c 4 months ago was 6.0.  Will obtain A1c today.  Will also evaluate with CT angio head and neck.  Patient has already had echocardiogram.  PT/OT already following.    RECOMMENDATIONS  #Acute to  early subacute ischemic infarcts of bilateral cerebral and cerebellar hemispheres likely from septic embolic source - Allow for permissive hypertension.  Rate control metoprolol  is okay, otherwise hold any other antihypertensive medications - CTA head and neck ordered - Check A1c, most recent LDL at goal at 25 about 1 month ago - Patient already on Eliquis  5 mg twice daily, can continue, no neurologic indication for antiplatelets at this time - q4 hr neuro checks - STAT head CT for any change in neuro exam - Tele - PT/OT - Stroke education - Amb referral to neurology  upon discharge - Continue atorvastatin  40 mg daily - Obtain additional MRI sequences with contrast  #Sepsis from mitral valve MSSA endocarditis Confirmed endocarditis on 06/24/2024 on TEE.  Patient is on cefazolin .  Continue management per primary team and ID.  Per CT surgery patient is not a surgical candidate.   #Atrial fibrillation Rate controlled with metoprolol  tartrate 50 mg twice daily as well as anticoagulated with Eliquis  5 mg twice daily. - Management per primary  #CAD status post CABG 1999 Patient currently on metoprolol  tartrate 50 mg twice daily and Lipitor 40 mg daily -Management per primary  #CKD stage IIIa Baseline creatinine around 1.1-1.3.  Will need contrast with CT. -Monitor kidney function  ______________________________________________________________________  Signed, Libby Blanch, DO Internal medicine resident PGY 3   Attending Neurohospitalist Addendum Patient seen and examined with APP/Resident. Agree with the history and physical as documented above. Agree with the plan as documented, which I helped formulate. I have edited the note above to reflect my full findings and recommendations. I have independently reviewed the chart, obtained history, review of systems and examined the patient.I have personally reviewed pertinent head/neck/spine imaging (CT/MRI). Please feel free to call with any questions.  OK to continue eliquis  given low risk of hemorrhagic conversion of small (subcentimeter) ischemic strokes w/ central embolic source. Workup per above. Stroke team will f/u tomorrow.  -- Elida Ross, MD [facility] [phone]  If 7pm- 7am, please page neurology on call as listed in AMION.

## 2024-06-25 NOTE — Progress Notes (Signed)
 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 Left neck wound from cancer moh's surgery  8/18 bcx mssa 8/20 bcx negative  Cts involved and poor surgical candidate Primary team discussed with neurology ok to resume anticoagulation   Improving mentation per his wife  So far no focal tenderness otherwise   PLAN: Continue high dose cefazolin  (plan 2 weeks until 0/93/74 then decrease to q8hr dosing Consider repeating brain imaging in 2-3 weeks make sure nothing had developed into abscess Maintain standard isolation precaution At least 6 weeks abx planned Discussed with primary team  Principal Problem:   MSSA bacteremia Active Problems:   Mixed hyperlipidemia   Essential hypertension   History of skin cancer   Atrial fibrillation (HCC)   Fever   Altered mental status   Sepsis (HCC)   Surgical site infection   No Known Allergies  Scheduled Meds:  [START ON 06/26/2024] amLODipine   5 mg Oral Daily   apixaban   5 mg Oral BID   atorvastatin   40 mg Oral Daily   feeding supplement  237 mL Oral BID BM   metoprolol  tartrate  50 mg Oral BID   multivitamin with minerals  1 tablet Oral Daily   polyethylene glycol  17 g Oral Daily   sodium chloride  flush  3 mL Intravenous Q12H   white petrolatum    Topical Daily   Continuous Infusions:   ceFAZolin  (ANCEF ) IV 2 g (06/25/24 1720)   PRN Meds:.acetaminophen  **OR** acetaminophen , HYDROcodone -acetaminophen , polyethylene glycol, sodium chloride  flush   SUBJECTIVE: Improved mentation No other complaint  Review of Systems: ROS All other ROS was negative, except mentioned above     OBJECTIVE: Vitals:   06/25/24 0750 06/25/24 1145 06/25/24 1645 06/25/24 2013  BP: (!) 147/69 (!) 144/73 (!)  152/77 (!) 138/59  Pulse: 80 61 73   Resp: 20 18 18 17   Temp: 98.3 F (36.8 C) 97.9 F (36.6 C) 97.6 F (36.4 C) 98.2 F (36.8 C)  TempSrc: Oral Oral Oral Oral  SpO2: 98% 98% 97%   Weight:      Height:       Body mass index is 32.69 kg/m.  Physical Exam General/constitutional: no distress, pleasant HEENT: Normocephalic, PER, Conj Clear, EOMI, Oropharynx clear Neck supple CV: rrr no mrg Lungs: clear to auscultation, normal respiratory effort Abd: Soft, Nontender Ext: no edema Skin: left neck wound Neuro: nonfocal MSK: no peripheral joint swelling/tenderness/warmth; back spines nontender   Lab Results Lab Results  Component Value Date   WBC 6.7 06/25/2024   HGB 12.3 (L) 06/25/2024   HCT 35.5 (L) 06/25/2024   MCV 92.9 06/25/2024   PLT 183 06/25/2024    Lab Results  Component Value Date   CREATININE 1.17 06/25/2024   BUN 17 06/25/2024   NA 136 06/25/2024   K 4.1 06/25/2024   CL 99 06/25/2024   CO2 26 06/25/2024    Lab Results  Component Value Date   ALT 25 06/25/2024   AST 67 (H) 06/25/2024   ALKPHOS 44 06/25/2024   BILITOT 0.8 06/25/2024      Microbiology: Recent Results (from the past 240 hours)  Blood Culture (routine x 2)     Status: Abnormal   Collection Time: 06/17/24  5:16  PM   Specimen: BLOOD LEFT FOREARM  Result Value Ref Range Status   Specimen Description BLOOD LEFT FOREARM  Final   Special Requests   Final    BOTTLES DRAWN AEROBIC AND ANAEROBIC Blood Culture adequate volume   Culture  Setup Time   Final    GRAM POSITIVE COCCI IN BOTH AEROBIC AND ANAEROBIC BOTTLES CRITICAL VALUE NOTED.  VALUE IS CONSISTENT WITH PREVIOUSLY REPORTED AND CALLED VALUE.    Culture (A)  Final    STAPHYLOCOCCUS AUREUS SUSCEPTIBILITIES PERFORMED ON PREVIOUS CULTURE WITHIN THE LAST 5 DAYS. Performed at [facility], [address], [place], [place] [address]    Report Status 06/20/2024 FINAL  Final  Blood Culture (routine x 2)     Status: Abnormal    Collection Time: 06/17/24  5:20 PM   Specimen: BLOOD RIGHT FOREARM  Result Value Ref Range Status   Specimen Description BLOOD RIGHT FOREARM  Final   Special Requests   Final    BOTTLES DRAWN AEROBIC AND ANAEROBIC Blood Culture adequate volume   Culture  Setup Time   Final    GRAM POSITIVE COCCI IN BOTH AEROBIC AND ANAEROBIC BOTTLES CRITICAL RESULT CALLED TO, READ BACK BY AND VERIFIED WITH: PHARMD E.MARTIN AT 9180 ON 06/18/2024 BY T.SAAD. Performed at [facility], [address], [place], [place] [address]    Culture STAPHYLOCOCCUS AUREUS (A)  Final   Report Status 06/20/2024 FINAL  Final   Organism ID, Bacteria STAPHYLOCOCCUS AUREUS  Final      Susceptibility   Staphylococcus aureus - MIC*    CIPROFLOXACIN <=0.5 SENSITIVE Sensitive     ERYTHROMYCIN <=0.25 SENSITIVE Sensitive     GENTAMICIN <=0.5 SENSITIVE Sensitive     OXACILLIN 0.5 SENSITIVE Sensitive     TETRACYCLINE >=16 RESISTANT Resistant     VANCOMYCIN  <=0.5 SENSITIVE Sensitive     TRIMETH/SULFA >=320 RESISTANT Resistant     CLINDAMYCIN <=0.25 SENSITIVE Sensitive     RIFAMPIN <=0.5 SENSITIVE Sensitive     Inducible Clindamycin NEGATIVE Sensitive     LINEZOLID 2 SENSITIVE Sensitive     * STAPHYLOCOCCUS AUREUS  Blood Culture ID Panel (Reflexed)     Status: Abnormal   Collection Time: 06/17/24  5:20 PM  Result Value Ref Range Status   Enterococcus faecalis NOT DETECTED NOT DETECTED Final   Enterococcus Faecium NOT DETECTED NOT DETECTED Final   Listeria monocytogenes NOT DETECTED NOT DETECTED Final   Staphylococcus species DETECTED (A) NOT DETECTED Final    Comment: CRITICAL RESULT CALLED TO, READ BACK BY AND VERIFIED WITH: PHARMD E.MARTIN AT 9180 ON 06/18/2024 BY T.SAAD.    Staphylococcus aureus (BCID) DETECTED (A) NOT DETECTED Final    Comment: CRITICAL RESULT CALLED TO, READ BACK BY AND VERIFIED WITH: PHARMD E.MARTIN AT 9180 ON 06/18/2024 BY T.SAAD.    Staphylococcus epidermidis NOT DETECTED NOT DETECTED Final    Staphylococcus lugdunensis NOT DETECTED NOT DETECTED Final   Streptococcus species NOT DETECTED NOT DETECTED Final   Streptococcus agalactiae NOT DETECTED NOT DETECTED Final   Streptococcus pneumoniae NOT DETECTED NOT DETECTED Final   Streptococcus pyogenes NOT DETECTED NOT DETECTED Final   A.calcoaceticus-baumannii NOT DETECTED NOT DETECTED Final   Bacteroides fragilis NOT DETECTED NOT DETECTED Final   Enterobacterales NOT DETECTED NOT DETECTED Final   Enterobacter cloacae complex NOT DETECTED NOT DETECTED Final   Escherichia coli NOT DETECTED NOT DETECTED Final   Klebsiella aerogenes NOT DETECTED NOT DETECTED Final   Klebsiella oxytoca NOT DETECTED NOT DETECTED Final  Klebsiella pneumoniae NOT DETECTED NOT DETECTED Final   Proteus species NOT DETECTED NOT DETECTED Final   Salmonella species NOT DETECTED NOT DETECTED Final   Serratia marcescens NOT DETECTED NOT DETECTED Final   Haemophilus influenzae NOT DETECTED NOT DETECTED Final   Neisseria meningitidis NOT DETECTED NOT DETECTED Final   Pseudomonas aeruginosa NOT DETECTED NOT DETECTED Final   Stenotrophomonas maltophilia NOT DETECTED NOT DETECTED Final   Candida albicans NOT DETECTED NOT DETECTED Final   Candida auris NOT DETECTED NOT DETECTED Final   Candida glabrata NOT DETECTED NOT DETECTED Final   Candida krusei NOT DETECTED NOT DETECTED Final   Candida parapsilosis NOT DETECTED NOT DETECTED Final   Candida tropicalis NOT DETECTED NOT DETECTED Final   Cryptococcus neoformans/gattii NOT DETECTED NOT DETECTED Final   Meth resistant mecA/C and MREJ NOT DETECTED NOT DETECTED Final    Comment: Performed at [facility], [address], [place], [place] [address]  Resp panel by RT-PCR (RSV, Flu A&B, Covid) Anterior Nasal Swab     Status: None   Collection Time: 06/18/24  8:58 AM   Specimen: Anterior Nasal Swab  Result Value Ref Range Status   SARS Coronavirus 2 by RT PCR NEGATIVE NEGATIVE Final   Influenza A by PCR  NEGATIVE NEGATIVE Final   Influenza B by PCR NEGATIVE NEGATIVE Final    Comment: (NOTE) The Xpert Xpress SARS-CoV-2/FLU/RSV plus assay is intended as an aid in the diagnosis of influenza from Nasopharyngeal swab specimens and should not be used as a sole basis for treatment. Nasal washings and aspirates are unacceptable for Xpert Xpress SARS-CoV-2/FLU/RSV testing.  Fact Sheet for Patients: BloggerCourse.com  Fact Sheet for Healthcare Providers: SeriousBroker.it  This test is not yet approved or cleared by the [place]  FDA and has been authorized for detection and/or diagnosis of SARS-CoV-2 by FDA under an Emergency Use Authorization (EUA). This EUA will remain in effect (meaning this test can be used) for the duration of the COVID-19 declaration under Section 564(b)(1) of the Act, 21 U.S.C. section 360bbb-3(b)(1), unless the authorization is terminated or revoked.     Resp Syncytial Virus by PCR NEGATIVE NEGATIVE Final    Comment: (NOTE) Fact Sheet for Patients: BloggerCourse.com  Fact Sheet for Healthcare Providers: SeriousBroker.it  This test is not yet approved or cleared by the [place]  FDA and has been authorized for detection and/or diagnosis of SARS-CoV-2 by FDA under an Emergency Use Authorization (EUA). This EUA will remain in effect (meaning this test can be used) for the duration of the COVID-19 declaration under Section 564(b)(1) of the Act, 21 U.S.C. section 360bbb-3(b)(1), unless the authorization is terminated or revoked.  Performed at [facility], [address], [place], [place] [address]   Culture, blood (Routine X 2) w Reflex to ID Panel     Status: None   Collection Time: 06/19/24  6:40 AM   Specimen: BLOOD RIGHT ARM  Result Value Ref Range Status   Specimen Description BLOOD RIGHT ARM  Final   Special Requests   Final    BOTTLES DRAWN  AEROBIC AND ANAEROBIC Blood Culture adequate volume   Culture   Final    NO GROWTH 5 DAYS Performed at [facility], [address], [place], [place] [address]    Report Status 06/24/2024 FINAL  Final  Culture, blood (Routine X 2) w Reflex to ID Panel     Status: None   Collection Time: 06/19/24  6:54 AM   Specimen: BLOOD  LEFT ARM  Result Value Ref Range Status   Specimen Description BLOOD LEFT ARM  Final   Special Requests   Final    BOTTLES DRAWN AEROBIC AND ANAEROBIC Blood Culture adequate volume   Culture   Final    NO GROWTH 5 DAYS Performed at [facility], [address] [place], [place] [address]    Report Status 06/24/2024 FINAL  Final     Serology:   Imaging: If present, new imagings (plain films, ct scans, and mri) have been personally visualized and interpreted; radiology reports have been reviewed. Decision making incorporated into the Impression / Recommendations.  8/26 mri brain FINDINGS: There is a punctate focus of enhancement in the right frontal lobe.   There is normal enhancement of the dural sinuses. No evidence of sinus thrombosis.   There is no extra-axial fluid collection.   No leptomeningeal enhancement.   IMPRESSION: There is a small focus of enhancement in the right frontal lobe at the site of 1 of the embolic infarcts noted on the prior study.   There is no evidence of brain abscess, meningitis, empyema or dural sinus thrombosis     8/25 cta head neck Bilateral carotid plaque without significant stenosis   Intracranial atherosclerosis of the right posterior cerebral artery.   No proximal embolic source identified.   No pseudoaneurysm identified.   8/25 tee  1. There is a large, mobile vegetation measuring 1.9 x 1.2cm attached to  the atrial aspect of the mitral valve, likely adherent to a shelf of  mitral annular calcification on the posterior annulus, adjacent to A3/P3  scallop. Evidence of small leaflet   perforation at site of vegetation with trivial to mild additional jet MR  at site of perforation. In total, MR appears mild-moderate. The mitral  valve is degenerative. Mild to moderate mitral valve regurgitation. Mild  mitral stenosis. The mean mitral  valve gradient is 6.0 mmHg with average heart rate of 82 bpm. Moderate to  severe mitral annular calcification.   2. Left ventricular ejection fraction, by estimation, is 55 to 60%. The  left ventricle has normal function.   3. Right ventricular systolic function is normal. The right ventricular  size is normal.   4. Left atrial size was mildly dilated. No left atrial/left atrial  appendage thrombus was detected. The LAA emptying velocity was 44 cm/s.   5. Right atrial size was mildly dilated.   6. The aortic valve is grossly normal. Aortic valve regurgitation is  trivial.   7. There is Moderate (Grade III) atheroma plaque involving the aortic  arch and descending aorta.   8. 3D performed of the mitral valve and demonstrates mitral valve  vegetation.     Constance ONEIDA Passer, MD [facility] [phone] pager    06/25/2024, 10:48 PM

## 2024-06-25 NOTE — TOC Progression Note (Signed)
 Transition of Care [facility]) - Progression Note    Patient Details  Name: Philip [name] MRN: [id number] Date of Birth: Jul 23, 1941  [facility]) CM/SW Contact  Waddell Barnie Rama, RN Phone Number: 06/25/2024, 1:26 PM  Clinical Narrative:    Patient with endocarditis, and emoboli's, NCM offered choice to wife at bedside for [facility] for iv abx, and HHPT.  She has no preference,  NCM made referral to Pam with [employer] and [place] with [place].  They are able to take referral.  Soc will begin 24 to 48 hrs post dc.                      Expected Discharge Plan and Services                                               Social Drivers of Health (SDOH) Interventions SDOH Screenings   Food Insecurity: No Food Insecurity (06/19/2024)  Housing: Low Risk  (06/19/2024)  Transportation Needs: No Transportation Needs (06/19/2024)  Utilities: Not At Risk (06/19/2024)  Alcohol Screen: Low Risk  (05/15/2024)  Depression (PHQ2-9): Low Risk  (03/27/2024)  Financial Resource Strain: Low Risk  (05/15/2024)  Physical Activity: Sufficiently Active (05/15/2024)  Social Connections: Moderately Integrated (06/20/2024)  Stress: No Stress Concern Present (05/15/2024)  Tobacco Use: Medium Risk (06/17/2024)    Readmission Risk Interventions    06/18/2024    3:45 PM  Readmission Risk Prevention Plan  Transportation Screening Complete  Home Care Screening Complete  Medication Review (RN CM) Complete

## 2024-06-25 NOTE — Progress Notes (Addendum)
 Physical Therapy Treatment Patient Details Name: NEWTON FRUTIGER MRN: [id number] DOB: 05-12-1941 Today's Date: 06/25/2024   History of Present Illness 83 yo male adm 06/17/24 due to wandering in  parking lot with fever and Afib. Pt recently had ear sx with graft from the neck and bacteremia. 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 PMH: Afib, CAD s/p CABG, PAD, OSA, AAA stenting, penile prothesis, HTN, HLD, skin CA    PT Comments  Pt with flat affect and decreased problem solving and awareness. Rt hip flexion 2+/5, left hip flexion 3/5 all other LB myotomes 4-5/5 and sensation intact. Pt reports feeling hips are weaker but able to walk long hall distance and perform stairs without physical assist with use of RW. Shortened session due to transport arrival for test. HHPT recommended and will continue to follow.      If plan is discharge home, recommend the following: Supervision due to cognitive status;Help with stairs or ramp for entrance;Assist for transportation;Assistance with cooking/housework   Can travel by private vehicle        Equipment Recommendations  None recommended by PT    Recommendations for Other Services       Precautions / Restrictions Precautions Precautions: Fall     Mobility  Bed Mobility Overal bed mobility: Modified Independent             General bed mobility comments: pt with bed flat, use of rail and no physical assist    Transfers Overall transfer level: Needs assistance   Transfers: Sit to/from Stand Sit to Stand: Supervision           General transfer comment: supervision for safety    Ambulation/Gait Ambulation/Gait assistance: Contact guard assist Gait Distance (Feet): 300 Feet Assistive device: Rolling walker (2 wheels) Gait Pattern/deviations: Step-through pattern, Decreased stride length   Gait velocity interpretation: 1.31 - 2.62 ft/sec, indicative of limited community  ambulator   General Gait Details: cues for direction and proximity to RW, pt stated feeling more secure with use of RW although able to walk short distances without it   Stairs Stairs: Yes Stairs assistance: Supervision Stair Management: Alternating pattern, Forwards, One rail Left Number of Stairs: 11 General stair comments: pt able to complete flight of stairs with supervision for safety   Wheelchair Mobility     Tilt Bed    Modified Rankin (Stroke Patients Only) Modified Rankin (Stroke Patients Only) Pre-Morbid Rankin Score: No symptoms Modified Rankin: Moderately severe disability     Balance Overall balance assessment: Mild deficits observed, not formally tested Sitting-balance support: No upper extremity supported, Feet supported Sitting balance-Leahy Scale: Good     Standing balance support: During functional activity, Reliant on assistive device for balance, Single extremity supported, Bilateral upper extremity supported Standing balance-Leahy Scale: Fair Standing balance comment: static standing with single UE support at toilet, bil UE support on RW for gait                            Communication Communication Communication: No apparent difficulties  Cognition Arousal: Alert Behavior During Therapy: Flat affect   PT - Cognitive impairments: Orientation, Awareness, Memory, Problem solving                       PT - Cognition Comments: pt unable to recall room number or use directional sign to find way to room Following commands: Intact  Following commands impaired: Only follows one step commands consistently    Cueing Cueing Techniques: Verbal cues  Exercises      General Comments        Pertinent Vitals/Pain Pain Assessment Pain Assessment: No/denies pain    Home Living                          Prior Function            PT Goals (current goals can now be found in the care plan section) Progress towards PT  goals: Progressing toward goals    Frequency    Min 2X/week      PT Plan      Co-evaluation              AM-PAC PT 6 Clicks Mobility   Outcome Measure  Help needed turning from your back to your side while in a flat bed without using bedrails?: A Little Help needed moving from lying on your back to sitting on the side of a flat bed without using bedrails?: A Little Help needed moving to and from a bed to a chair (including a wheelchair)?: A Little Help needed standing up from a chair using your arms (e.g., wheelchair or bedside chair)?: A Little Help needed to walk in hospital room?: A Little Help needed climbing 3-5 steps with a railing? : A Little 6 Click Score: 18    End of Session Equipment Utilized During Treatment: Gait belt Activity Tolerance: Patient tolerated treatment well Patient left: in chair;with nursing/sitter in room (with staff for transport to test) Nurse Communication: Mobility status PT Visit Diagnosis: Unsteadiness on feet (R26.81);Muscle weakness (generalized) (M62.81);Difficulty in walking, not elsewhere classified (R26.2);Other abnormalities of gait and mobility (R26.89)     Time: 8886-8874 PT Time Calculation (min) (ACUTE ONLY): 12 min  Charges:    $Therapeutic Activity: 8-22 mins PT General Charges $$ ACUTE PT VISIT: 1 Visit                     Lenoard SQUIBB, PT Acute Rehabilitation Services Office: [phone]    [name] Billie Trager 06/25/2024, 1:31 PM

## 2024-06-26 ENCOUNTER — Other Ambulatory Visit: Payer: Self-pay

## 2024-06-26 DIAGNOSIS — R29700 NIHSS score 0: Secondary | ICD-10-CM | POA: Diagnosis not present

## 2024-06-26 DIAGNOSIS — E876 Hypokalemia: Secondary | ICD-10-CM | POA: Diagnosis not present

## 2024-06-26 DIAGNOSIS — I63443 Cerebral infarction due to embolism of bilateral cerebellar arteries: Secondary | ICD-10-CM | POA: Diagnosis not present

## 2024-06-26 DIAGNOSIS — I480 Paroxysmal atrial fibrillation: Secondary | ICD-10-CM | POA: Diagnosis not present

## 2024-06-26 DIAGNOSIS — I6340 Cerebral infarction due to embolism of unspecified cerebral artery: Secondary | ICD-10-CM | POA: Diagnosis not present

## 2024-06-26 DIAGNOSIS — I4891 Unspecified atrial fibrillation: Secondary | ICD-10-CM

## 2024-06-26 DIAGNOSIS — T679XXA Effect of heat and light, unspecified, initial encounter: Secondary | ICD-10-CM

## 2024-06-26 DIAGNOSIS — I10 Essential (primary) hypertension: Secondary | ICD-10-CM | POA: Diagnosis not present

## 2024-06-26 DIAGNOSIS — I38 Endocarditis, valve unspecified: Secondary | ICD-10-CM

## 2024-06-26 DIAGNOSIS — R7881 Bacteremia: Secondary | ICD-10-CM | POA: Diagnosis not present

## 2024-06-26 LAB — CBC
HCT: 34.6 % — ABNORMAL LOW (ref 39.0–52.0)
Hemoglobin: 12.1 g/dL — ABNORMAL LOW (ref 13.0–17.0)
MCH: 32.4 pg (ref 26.0–34.0)
MCHC: 35 g/dL (ref 30.0–36.0)
MCV: 92.5 fL (ref 80.0–100.0)
Platelets: 188 K/uL (ref 150–400)
RBC: 3.74 MIL/uL — ABNORMAL LOW (ref 4.22–5.81)
RDW: 14 % (ref 11.5–15.5)
WBC: 6.8 K/uL (ref 4.0–10.5)
nRBC: 0 % (ref 0.0–0.2)

## 2024-06-26 LAB — COMPREHENSIVE METABOLIC PANEL WITH GFR
ALT: 19 U/L (ref 0–44)
AST: 58 U/L — ABNORMAL HIGH (ref 15–41)
Albumin: 3.1 g/dL — ABNORMAL LOW (ref 3.5–5.0)
Alkaline Phosphatase: 42 U/L (ref 38–126)
Anion gap: 11 (ref 5–15)
BUN: 18 mg/dL (ref 8–23)
CO2: 26 mmol/L (ref 22–32)
Calcium: 8.8 mg/dL — ABNORMAL LOW (ref 8.9–10.3)
Chloride: 100 mmol/L (ref 98–111)
Creatinine, Ser: 1.32 mg/dL — ABNORMAL HIGH (ref 0.61–1.24)
GFR, Estimated: 53 mL/min — ABNORMAL LOW (ref >60)
Glucose, Bld: 110 mg/dL — ABNORMAL HIGH (ref 70–99)
Potassium: 3.7 mmol/L (ref 3.5–5.1)
Sodium: 137 mmol/L (ref 135–145)
Total Bilirubin: 0.7 mg/dL (ref 0.0–1.2)
Total Protein: 5.8 g/dL — ABNORMAL LOW (ref 6.5–8.1)

## 2024-06-26 MED ORDER — CEFAZOLIN IV (FOR PTA / DISCHARGE USE ONLY): 6.0000 g | INTRAVENOUS | 0 refills | Status: DC

## 2024-06-26 MED ORDER — STROKE: EARLY STAGES OF RECOVERY BOOK
Freq: Once | Status: AC
  Filled 2024-06-26: qty 1

## 2024-06-26 NOTE — Progress Notes (Signed)
 PHARMACY CONSULT NOTE FOR:  OUTPATIENT  PARENTERAL ANTIBIOTIC THERAPY (OPAT)  Indication: MSSA endocarditis Regimen: Cefazolin  8g daily as a continuous infusion thru 07/09/24 followed by 6g daily as a continuous infusion thru 10/1 End date: 07/31/24 (6 weeks from 8/20)  IV antibiotic discharge orders are pended. To discharging provider:  please sign these orders via discharge navigator,  Select New Orders & click on the button choice - Manage This Unsigned Work.     Thank you for allowing pharmacy to be a part of this patient's care.  Almarie Lunger, PharmD, BCPS, BCIDP Infectious Diseases Clinical Pharmacist 06/26/2024 1:20 PM   **Pharmacist phone directory can now be found on [email or web address] ([email or web address]).  Listed under [facility].

## 2024-06-26 NOTE — Progress Notes (Signed)
 Initial Nutrition Assessment  DOCUMENTATION CODES:   Obesity unspecified  INTERVENTION:  -Continue regular menu, regular texture, thin liquids -Continue Ensure Plus High Protein BID -Continue MVI -Discussed importance of adequate kcal/pro intake for healing, fueling body, etc   NUTRITION DIAGNOSIS:   Increased nutrient needs related to acute illness, wound healing as evidenced by estimated needs.  GOAL:   Patient will meet greater than or equal to 90% of their needs  MONITOR:   PO intake, Skin, Supplement acceptance, Labs, Weight trends  REASON FOR ASSESSMENT:   Consult Assessment of nutrition requirement/status  ASSESSMENT:   Hx atrial fibrillation, CAD status post CABG, PAD, obstructive sleep apnea, history of AAA stenting presented to hospital after being found wandering in a parking lot.  Spoke to pt and family in room. Pt denies n/v/d or chewing/swallowing difficulties. Last BM 8/25. Does endorse some constipation issues currently and at home. Pt with fair appetite, documented meal average 51% of all recorded meals. He does have documented weight loss of 10.1 kg x 3 days (9.5%); suspect fluid fluctuations and or scale differences vs actual loss. Discussed importance of adequate kcal/pro intake, encouraged increased intake as able. Pt with order for Ensure Plus High Protein BID, however, Per pt/family has not received one yet. This RD got pt one to drink at visit. Pt/family deny additional questions/concerns/needs at this time, will continue to monitor, RDN available prn.   Labs BG 104-110 Cr 1.32 Calcium  8.8 Albumin 3.1 AST 58 GFR 53 H/H 12.1/34.6 A1c 5.9  Medications Scheduled Meds:  amLODipine   5 mg Oral Daily   apixaban   5 mg Oral BID   atorvastatin   40 mg Oral Daily   feeding supplement  237 mL Oral BID BM   metoprolol  tartrate  50 mg Oral BID   multivitamin with minerals  1 tablet Oral Daily   polyethylene glycol  17 g Oral Daily   sodium chloride   flush  3 mL Intravenous Q12H   white petrolatum    Topical Daily   Continuous Infusions:   ceFAZolin  (ANCEF ) IV 2 g (06/26/24 1150)   PRN Meds:.acetaminophen  **OR** acetaminophen , HYDROcodone -acetaminophen , polyethylene glycol, sodium chloride  flush   Diet Order:   Diet Order             Diet regular Room service appropriate? Yes; Fluid consistency: Thin  Diet effective now                   EDUCATION NEEDS:   Education needs have been addressed  Skin:  Skin Assessment: Skin Integrity Issues: Skin Integrity Issues:: Other (Comment) Other: Neck and Ear wounds  Last BM:  8/25  Height:   Ht Readings from Last 1 Encounters:  06/17/24 5' 9 (1.753 m)    Weight:   Wt Readings from Last 1 Encounters:  06/26/24 96.6 kg    BMI:  Body mass index is 31.45 kg/m.  Estimated Nutritional Needs:   Kcal:  1600-2000 kcal  Protein:  85-110 g  Fluid:  >/=1.6L  Philip [name], RDN, LDN Registered Dietitian Nutritionist RD Inpatient Contact Info in [place]

## 2024-06-26 NOTE — Progress Notes (Addendum)
 STROKE TEAM PROGRESS NOTE   INTERIM HISTORY/SUBJECTIVE Confusion is improving; states it is 1925, August 27, no focal weakness on exam. ID following for endocarditis and ABX. On eliquis  for afib already.  MRI scan of the brain shows multiple scattered small acute to early subacute infarcts involving bilateral cerebral and cerebellar hemispheres and left pons.  Postcontrast images show solitary enhancing right frontal lesion without definite evidence of brain abscesses.  Transesophageal echo shows a large 1.9 x 1.2 cm mobile vegetation involving atrial aspect of the mitral valve.  Blood cultures are negative.  Patient is on IV cefazolin  and being followed by ID His family is at the bedside.  Mental status and confusion have improved and is almost back to his baseline.  No focal deficits OBJECTIVE  CBC    Component Value Date/Time   WBC 6.8 06/26/2024 0250   RBC 3.74 (L) 06/26/2024 0250   HGB 12.1 (L) 06/26/2024 0250   HGB 14.8 08/14/2019 1030   HCT 34.6 (L) 06/26/2024 0250   HCT 42.5 08/14/2019 1030   PLT 188 06/26/2024 0250   PLT 158 08/14/2019 1030   MCV 92.5 06/26/2024 0250   MCV 94 08/14/2019 1030   MCH 32.4 06/26/2024 0250   MCHC 35.0 06/26/2024 0250   RDW 14.0 06/26/2024 0250   RDW 12.7 08/14/2019 1030   LYMPHSABS 0.6 (L) 06/17/2024 1734   LYMPHSABS 1.4 06/06/2018 0000   MONOABS 0.8 06/17/2024 1734   EOSABS 0.0 06/17/2024 1734   EOSABS 0.1 06/06/2018 0000   BASOSABS 0.0 06/17/2024 1734   BASOSABS 0.0 06/06/2018 0000    BMET    Component Value Date/Time   NA 137 06/26/2024 0250   NA 141 09/09/2020 0842   K 3.7 06/26/2024 0250   CL 100 06/26/2024 0250   CO2 26 06/26/2024 0250   GLUCOSE 110 (H) 06/26/2024 0250   BUN 18 06/26/2024 0250   BUN 15 09/09/2020 0842   CREATININE 1.32 (H) 06/26/2024 0250   CREATININE 1.11 10/28/2022 1440   CALCIUM  8.8 (L) 06/26/2024 0250   GFRNONAA 53 (L) 06/26/2024 0250    IMAGING past 24 hours CT ANGIO HEAD NECK W WO CM Result Date:  06/25/2024 CLINICAL DATA:  Multiple emboli EXAM: CT ANGIOGRAPHY HEAD AND NECK WITH AND WITHOUT CONTRAST TECHNIQUE: Multidetector CT imaging of the head and neck was performed using the standard protocol during bolus administration of intravenous contrast. Multiplanar CT image reconstructions and MIPs were obtained to evaluate the vascular anatomy. Carotid stenosis measurements (when applicable) are obtained utilizing NASCET criteria, using the distal internal carotid diameter as the denominator. RADIATION DOSE REDUCTION: This exam was performed according to the departmental dose-optimization program which includes automated exposure control, adjustment of the mA and/or kV according to patient size and/or use of iterative reconstruction technique. CONTRAST:  75mL OMNIPAQUE  IOHEXOL  350 MG/ML SOLN COMPARISON:  CT same day FINDINGS: CT HEAD FINDINGS CT HEAD: Attenuation in the brain parenchyma is normal. There is no hemorrhage. No acute ischemic changes. No mass lesion. The ventricles are normal. Skull/sinuses/orbits: No significant abnormality. CTA NECK: CTA NECK Aortic arch: No proximal vessel stenosis. Right carotid: There is calcified plaque at the bifurcation without significant stenosis Left carotid: There is calcified plaque at the bifurcation without significant stenosis Right vertebral: Normal Left vertebral: Normal Soft tissues: No significant abnormality Other comments: None CTA HEAD: CTA HEAD Right anterior circulation: The internal carotid artery is patent without significant stenosis. The anterior and middle cerebral arteries are patent without significant stenosis or proximal branch occlusion.  No aneurysm. Left anterior circulation: The internal carotid artery is patent without significant stenosis. The anterior and middle cerebral arteries are patent without significant stenosis or proximal branch occlusion. No aneurysm. Posterior circulation: Both vertebral arteries are patent. The basilar artery is  patent without significant stenosis. There is intracranial atherosclerosis of the right posterior cerebral artery. No aneurysm. IMPRESSION: Bilateral carotid plaque without significant stenosis Intracranial atherosclerosis of the right posterior cerebral artery. No proximal embolic source identified. No pseudoaneurysm identified. Electronically Signed   By: Nancyann Burns M.D.   On: 06/25/2024 17:06   MR BRAIN W CONTRAST Result Date: 06/25/2024 CLINICAL DATA:  Multifocal septic emboli, confusion EXAM: MRI HEAD WITH CONTRAST TECHNIQUE: Multiplanar, multiecho pulse sequences of the brain and surrounding structures were obtained with intravenous contrast. CONTRAST:  10mL GADAVIST  GADOBUTROL  1 MMOL/ML IV SOLN COMPARISON:  June 24, 2024 FINDINGS: There is a punctate focus of enhancement in the right frontal lobe. There is normal enhancement of the dural sinuses. No evidence of sinus thrombosis. There is no extra-axial fluid collection. No leptomeningeal enhancement. IMPRESSION: There is a small focus of enhancement in the right frontal lobe at the site of 1 of the embolic infarcts noted on the prior study. There is no evidence of brain abscess, meningitis, empyema or dural sinus thrombosis Electronically Signed   By: Nancyann Burns M.D.   On: 06/25/2024 16:56    Vitals:   06/25/24 2013 06/25/24 2326 06/26/24 0422 06/26/24 0719  BP: (!) 138/59 (!) 155/76 (!) 153/75 (!) 158/85  Pulse:  71 71 78  Resp: 17 18 18 18   Temp: 98.2 F (36.8 C) 98.2 F (36.8 C) 98.3 F (36.8 C) 98 F (36.7 C)  TempSrc: Oral Oral Oral Oral  SpO2:  95% 96% 97%  Weight:   96.6 kg   Height:         PHYSICAL EXAM General:  Alert, well-nourished, well-developed patient in no acute distress Psych:  Mood and affect appropriate for situation CV: Regular rate and rhythm on monitor Respiratory:  Regular, unlabored respirations on room air GI: Abdomen soft and nontender   NEURO:  Mental Status: Alert and oriented to self, place,  age, month and day but initially states it is 1926 and correct himself, patient is able to give clear and coherent history Speech/Language: speech is without dysarthria or aphasia.  Naming, repetition, fluency, and comprehension intact.  Cranial Nerves:  II: PERRL. Visual fields full.  III, IV, VI: EOMI. Eyelids elevate symmetrically.  V: Sensation is intact to light touch and symmetrical to face.  VII: Face is symmetrical resting and smiling VIII: hearing intact to voice. IX, X: Palate elevates symmetrically. Phonation is normal.  KP:Dynloizm shrug 5/5. XII: tongue is midline without fasciculations. Motor: 5/5 strength to all muscle groups tested.  Tone: is normal and bulk is normal Sensation- Intact to light touch bilaterally. Extinction absent to light touch to DSS.   Coordination: FTN intact bilaterally, HKS: no ataxia in BLE.No drift.  Gait- deferred  Most Recent NIH 0     ASSESSMENT/PLAN  Philip [name] is a 83 y.o. male with history of CAD status post CABG in 1999, paroxysmal atrial fibrillation on Eliquis , stent grafting for AAA in 2014, hyperlipidemia who presented to the emergency department on 06/17/2024 after being found confused in a parking lot.  EMS was called by bystanders and patient found to be tachycardic and hypoxic.  Initial NIH 5.  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  CTA head & neck Bilateral carotid plaque without significant stenosis. Intracranial atherosclerosis of the right posterior cerebral artery. MRI wo  Multiple scattered subcentimeter acute to early subacute ischemic infarcts involving the bilateral cerebral and cerebellar hemispheres as well as the left pons. No associated hemorrhage or significant mass effect. Given the various vascular distributions involved, a central  thromboembolic etiology is likely. MRI w There is a small focus of enhancement in the right frontal lobe at the site of 1 of the embolic infarcts noted on the prior study. TEE There is a large, mobile vegetation measuring 1.9 x 1.2cm attached to the atrial aspect of the mitral valve. Left atrial size was mildly dilated. No left atrial/left atrial appendage thrombus was detected.  2D Echo EF 50% LDL 25 HgbA1c 5.9 VTE prophylaxis - Eliquis   Eliquis  (apixaban ) daily prior to admission, now on Eliquis  (apixaban ) daily  Therapy recommendations:  Home Health RN, PT Disposition:  pending   Endocarditis  ID following  Cefazolin , dosing per ID pharmacy for CNS penetration Plan for PICC line and OPAT  Hypertension BP goal: Normotension Continue metoprolol  and resume amlodipine  Hold losartan , hydrochlorothiazide   Atrial fibrillation Home Meds: Eliquis   Continue telemetry monitoring Continue eliquis  per cardiology and ID recommendations   Hyperlipidemia Home meds: Atorvastatin  40, resumed in hospital LDL 25, goal < 70 Continue statin at discharge  Wound on left neck MOHS surgery with skin graft To heal by secondary intention, follow up outpatient   Hospital day # 8   Patient seen and examined by NP/APP with MD. MD to update note as needed.   Philip Last, DNP, FNP-BC [facility] Pager: [phone]  I have personally obtained history,examined this patient, reviewed notes, independently viewed imaging studies, participated in medical decision making and plan of care.ROS completed by me personally and pertinent positives fully documented  I have made any additions or clarifications directly to the above note. Agree with note above.  Patient developed confusion as well as new onset A-fib with rapid RVR and MRI scan shows bilateral embolic cortical and subcortical infarcts but neurologically is improving and almost back to his baseline.  Echocardiogram shows a large 1.9 x  1.2 cm mitral valve vegetation CT angiogram showed only mild extracranial intracranial atherosclerosis without large vessel stenosis or occlusion.  Agree with Eliquis  for anticoagulation for stroke prevention for A-fib The patient has endocarditis and may have slightly higher risk of brain hemorrhage.  Discussed risk-benefit of anticoagulation with patient and his family members at the bedside and answered questions.  Maintain aggressive risk factor modification.  Mobilize out of bed.  Therapy consults.  Continue antibiotics for endocarditis as per ID.  Stroke team will sign off.  Follow-up as an outpatient stroke clinic in 2 months.  Discussed with Dr. Drusilla   I personally spent a total of 50 minutes in the care of the patient today including getting/reviewing separately obtained history, performing a medically appropriate exam/evaluation, counseling and educating, placing orders, referring and communicating with other health care professionals, documenting clinical information in the EHR, independently interpreting results, and coordinating care.        Eather Popp, MD Medical Director [facility] Pager: [phone] 06/26/2024 4:13 PM  To contact Stroke Continuity provider, please refer to [email or web address]. After hours, contact General Neurology

## 2024-06-26 NOTE — Progress Notes (Signed)
 [facility]  Date of Admission:  06/17/2024     Reason for Follow Up: MSSA bacteremia  Total days of antibiotics 9         ASSESSMENT:  Mr. Pletz is an 83 y/o caucasian male with history of skin cancer and recent skin grafting around his neck and left ear arriving with altered mental status and found to have MSSA bacteremia. TEE with large mobile vegetation on mitral valve. Not a candidate for valve replacement surgery.   Mr. Denk MRI from 06/25/24 with embolic infarcts concerning for septic emboli. Blood cultures are cleared. Discussed plan of care for 6 weeks of antibiotics with Cefazolin . Will continue CNS dosed Cefazolin  2 g every 6 hours for 2 weeks and then reduce to Cefazolin  2 q every 8 hours for the remaining 4 weeks. Place PICC line. Home Health/OPAT orders. Son does have some concern about discharge to home and not a CIR candidate. Will arrange follow up in ID clinic and okay for discharge from ID standpoint once PICC placed and home health arranged/disposition determined. Remaining medical and supportive care per Internal Medicine.   PLAN:  Continue current antimicrobial therapy with Cefazolin  through 07/31/24.  Place PICC line.  Home Health/OPAT orders. Follow up in ID office and okay for discharge from ID standpoint once home health/disposition is determined.  Remaining medical and supportive care per Internal Medicine.   Diagnosis:  MSSA bacteremia and aortic valve endocarditis   No Known Allergies  OPAT Orders Discharge antibiotics to be given via PICC line Discharge antibiotics: Cefazolin  2 g IV q 6 hours for 2 weeks through 07/09/24 and then Cefazolin  2 q IV q 8 through 07/31/24  Per pharmacy protocol   Duration: 6 weeks  End Date: 07/31/24   [facility] Care Per Protocol:  Home health RN for IV administration and teaching; PICC line care and labs.    Labs weekly while on IV antibiotics: _X_ CBC with differential _X_ BMP __ CMP _X_  CRP _X_ ESR __ Vancomycin  trough __ CK  _X_ Please pull PIC at completion of IV antibiotics __ Please leave PIC in place until doctor has seen patient or been notified  Fax weekly labs to [phone]  Clinic Follow Up Appt:  07/09/24 at 9:15 am with Cathlyn July, NP   Principal Problem:   MSSA bacteremia Active Problems:   Surgical site infection   Mixed hyperlipidemia   Essential hypertension   History of skin cancer   Atrial fibrillation (HCC)   Fever   Altered mental status   Sepsis (HCC)    amLODipine   5 mg Oral Daily   apixaban   5 mg Oral BID   atorvastatin   40 mg Oral Daily   feeding supplement  237 mL Oral BID BM   metoprolol  tartrate  50 mg Oral BID   multivitamin with minerals  1 tablet Oral Daily   polyethylene glycol  17 g Oral Daily   sodium chloride  flush  3 mL Intravenous Q12H   white petrolatum    Topical Daily    SUBJECTIVE:  Afebrile overnight with no acute events. Tolerating antibiotics with no adverse side effects. Son and family visiting.   No Known Allergies   Review of Systems: Review of Systems  Constitutional:  Negative for chills, fever and weight loss.  Respiratory:  Negative for cough, shortness of breath and wheezing.   Cardiovascular:  Negative for chest pain and leg swelling.  Gastrointestinal:  Negative for abdominal pain, constipation, diarrhea, nausea  and vomiting.  Skin:  Negative for rash.      OBJECTIVE: Vitals:   06/25/24 2326 06/26/24 0422 06/26/24 0719 06/26/24 1215  BP: (!) 155/76 (!) 153/75 (!) 158/85 133/64  Pulse: 71 71 78 62  Resp: 18 18 18 17   Temp: 98.2 F (36.8 C) 98.3 F (36.8 C) 98 F (36.7 C)   TempSrc: Oral Oral Oral   SpO2: 95% 96% 97% 96%  Weight:  96.6 kg    Height:       Body mass index is 31.45 kg/m.  Physical Exam Constitutional:      General: He is not in acute distress.    Appearance: He is well-developed.  Cardiovascular:     Rate and Rhythm: Normal rate and regular rhythm.      Heart sounds: Normal heart sounds.  Pulmonary:     Effort: Pulmonary effort is normal.     Breath sounds: Normal breath sounds.  Skin:    General: Skin is warm and dry.  Neurological:     Mental Status: He is alert.     Lab Results Lab Results  Component Value Date   WBC 6.8 06/26/2024   HGB 12.1 (L) 06/26/2024   HCT 34.6 (L) 06/26/2024   MCV 92.5 06/26/2024   PLT 188 06/26/2024    Lab Results  Component Value Date   CREATININE 1.32 (H) 06/26/2024   BUN 18 06/26/2024   NA 137 06/26/2024   K 3.7 06/26/2024   CL 100 06/26/2024   CO2 26 06/26/2024    Lab Results  Component Value Date   ALT 19 06/26/2024   AST 58 (H) 06/26/2024   ALKPHOS 42 06/26/2024   BILITOT 0.7 06/26/2024     Microbiology: Recent Results (from the past 240 hours)  Blood Culture (routine x 2)     Status: Abnormal   Collection Time: 06/17/24  5:16 PM   Specimen: BLOOD LEFT FOREARM  Result Value Ref Range Status   Specimen Description BLOOD LEFT FOREARM  Final   Special Requests   Final    BOTTLES DRAWN AEROBIC AND ANAEROBIC Blood Culture adequate volume   Culture  Setup Time   Final    GRAM POSITIVE COCCI IN BOTH AEROBIC AND ANAEROBIC BOTTLES CRITICAL VALUE NOTED.  VALUE IS CONSISTENT WITH PREVIOUSLY REPORTED AND CALLED VALUE.    Culture (A)  Final    STAPHYLOCOCCUS AUREUS SUSCEPTIBILITIES PERFORMED ON PREVIOUS CULTURE WITHIN THE LAST 5 DAYS. Performed at [facility], [address], [place], [place] [address]    Report Status 06/20/2024 FINAL  Final  Blood Culture (routine x 2)     Status: Abnormal   Collection Time: 06/17/24  5:20 PM   Specimen: BLOOD RIGHT FOREARM  Result Value Ref Range Status   Specimen Description BLOOD RIGHT FOREARM  Final   Special Requests   Final    BOTTLES DRAWN AEROBIC AND ANAEROBIC Blood Culture adequate volume   Culture  Setup Time   Final    GRAM POSITIVE COCCI IN BOTH AEROBIC AND ANAEROBIC BOTTLES CRITICAL RESULT CALLED TO, READ BACK BY AND  VERIFIED WITH: PHARMD E.MARTIN AT 9180 ON 06/18/2024 BY T.SAAD. Performed at [facility], [address], [place], [place] [address]    Culture STAPHYLOCOCCUS AUREUS (A)  Final   Report Status 06/20/2024 FINAL  Final   Organism ID, Bacteria STAPHYLOCOCCUS AUREUS  Final      Susceptibility   Staphylococcus aureus - MIC*    CIPROFLOXACIN <=0.5 SENSITIVE Sensitive  ERYTHROMYCIN <=0.25 SENSITIVE Sensitive     GENTAMICIN <=0.5 SENSITIVE Sensitive     OXACILLIN 0.5 SENSITIVE Sensitive     TETRACYCLINE >=16 RESISTANT Resistant     VANCOMYCIN  <=0.5 SENSITIVE Sensitive     TRIMETH/SULFA >=320 RESISTANT Resistant     CLINDAMYCIN <=0.25 SENSITIVE Sensitive     RIFAMPIN <=0.5 SENSITIVE Sensitive     Inducible Clindamycin NEGATIVE Sensitive     LINEZOLID 2 SENSITIVE Sensitive     * STAPHYLOCOCCUS AUREUS  Blood Culture ID Panel (Reflexed)     Status: Abnormal   Collection Time: 06/17/24  5:20 PM  Result Value Ref Range Status   Enterococcus faecalis NOT DETECTED NOT DETECTED Final   Enterococcus Faecium NOT DETECTED NOT DETECTED Final   Listeria monocytogenes NOT DETECTED NOT DETECTED Final   Staphylococcus species DETECTED (A) NOT DETECTED Final    Comment: CRITICAL RESULT CALLED TO, READ BACK BY AND VERIFIED WITH: PHARMD E.MARTIN AT 9180 ON 06/18/2024 BY T.SAAD.    Staphylococcus aureus (BCID) DETECTED (A) NOT DETECTED Final    Comment: CRITICAL RESULT CALLED TO, READ BACK BY AND VERIFIED WITH: PHARMD E.MARTIN AT 9180 ON 06/18/2024 BY T.SAAD.    Staphylococcus epidermidis NOT DETECTED NOT DETECTED Final   Staphylococcus lugdunensis NOT DETECTED NOT DETECTED Final   Streptococcus species NOT DETECTED NOT DETECTED Final   Streptococcus agalactiae NOT DETECTED NOT DETECTED Final   Streptococcus pneumoniae NOT DETECTED NOT DETECTED Final   Streptococcus pyogenes NOT DETECTED NOT DETECTED Final   A.calcoaceticus-baumannii NOT DETECTED NOT DETECTED Final   Bacteroides fragilis NOT  DETECTED NOT DETECTED Final   Enterobacterales NOT DETECTED NOT DETECTED Final   Enterobacter cloacae complex NOT DETECTED NOT DETECTED Final   Escherichia coli NOT DETECTED NOT DETECTED Final   Klebsiella aerogenes NOT DETECTED NOT DETECTED Final   Klebsiella oxytoca NOT DETECTED NOT DETECTED Final   Klebsiella pneumoniae NOT DETECTED NOT DETECTED Final   Proteus species NOT DETECTED NOT DETECTED Final   Salmonella species NOT DETECTED NOT DETECTED Final   Serratia marcescens NOT DETECTED NOT DETECTED Final   Haemophilus influenzae NOT DETECTED NOT DETECTED Final   Neisseria meningitidis NOT DETECTED NOT DETECTED Final   Pseudomonas aeruginosa NOT DETECTED NOT DETECTED Final   Stenotrophomonas maltophilia NOT DETECTED NOT DETECTED Final   Candida albicans NOT DETECTED NOT DETECTED Final   Candida auris NOT DETECTED NOT DETECTED Final   Candida glabrata NOT DETECTED NOT DETECTED Final   Candida krusei NOT DETECTED NOT DETECTED Final   Candida parapsilosis NOT DETECTED NOT DETECTED Final   Candida tropicalis NOT DETECTED NOT DETECTED Final   Cryptococcus neoformans/gattii NOT DETECTED NOT DETECTED Final   Meth resistant mecA/C and MREJ NOT DETECTED NOT DETECTED Final    Comment: Performed at [facility], [address] [place], [place] [address]  Resp panel by RT-PCR (RSV, Flu A&B, Covid) Anterior Nasal Swab     Status: None   Collection Time: 06/18/24  8:58 AM   Specimen: Anterior Nasal Swab  Result Value Ref Range Status   SARS Coronavirus 2 by RT PCR NEGATIVE NEGATIVE Final   Influenza A by PCR NEGATIVE NEGATIVE Final   Influenza B by PCR NEGATIVE NEGATIVE Final    Comment: (NOTE) The Xpert Xpress SARS-CoV-2/FLU/RSV plus assay is intended as an aid in the diagnosis of influenza from Nasopharyngeal swab specimens and should not be used as a sole basis for treatment. Nasal washings and aspirates are unacceptable for Xpert Xpress SARS-CoV-2/FLU/RSV testing.  Fact Sheet for  Patients: BloggerCourse.com  Fact Sheet for Healthcare Providers: SeriousBroker.it  This test is not yet approved or cleared by the [place]  FDA and has been authorized for detection and/or diagnosis of SARS-CoV-2 by FDA under an Emergency Use Authorization (EUA). This EUA will remain in effect (meaning this test can be used) for the duration of the COVID-19 declaration under Section 564(b)(1) of the Act, 21 U.S.C. section 360bbb-3(b)(1), unless the authorization is terminated or revoked.     Resp Syncytial Virus by PCR NEGATIVE NEGATIVE Final    Comment: (NOTE) Fact Sheet for Patients: BloggerCourse.com  Fact Sheet for Healthcare Providers: SeriousBroker.it  This test is not yet approved or cleared by the [place]  FDA and has been authorized for detection and/or diagnosis of SARS-CoV-2 by FDA under an Emergency Use Authorization (EUA). This EUA will remain in effect (meaning this test can be used) for the duration of the COVID-19 declaration under Section 564(b)(1) of the Act, 21 U.S.C. section 360bbb-3(b)(1), unless the authorization is terminated or revoked.  Performed at [facility], [address] [place], [place] [address]   Culture, blood (Routine X 2) w Reflex to ID Panel     Status: None   Collection Time: 06/19/24  6:40 AM   Specimen: BLOOD RIGHT ARM  Result Value Ref Range Status   Specimen Description BLOOD RIGHT ARM  Final   Special Requests   Final    BOTTLES DRAWN AEROBIC AND ANAEROBIC Blood Culture adequate volume   Culture   Final    NO GROWTH 5 DAYS Performed at [facility], [address] [place], [place] [address]    Report Status 06/24/2024 FINAL  Final  Culture, blood (Routine X 2) w Reflex to ID Panel     Status: None   Collection Time: 06/19/24  6:54 AM   Specimen: BLOOD LEFT ARM  Result Value Ref Range Status   Specimen  Description BLOOD LEFT ARM  Final   Special Requests   Final    BOTTLES DRAWN AEROBIC AND ANAEROBIC Blood Culture adequate volume   Culture   Final    NO GROWTH 5 DAYS Performed at [facility], [address] [place], [place] [address]    Report Status 06/24/2024 FINAL  Final     Cathlyn July, NP [facility]  06/26/2024  1:34 PM

## 2024-06-26 NOTE — TOC Progression Note (Signed)
 Transition of Care [facility]) - Progression Note    Patient Details  Name: Philip [name] MRN: [id number] Date of Birth: 09-Oct-1941  Transition of [facility] CM/SW Contact  Waddell Barnie Rama, RN Phone Number: 06/26/2024, 4:16 PM  Clinical Narrative:    NCM faxed the OPAT orders to [facility], put the original on the shadow chart.                     Expected Discharge Plan and Services                                               Social Drivers of Health (SDOH) Interventions SDOH Screenings   Food Insecurity: No Food Insecurity (06/19/2024)  Housing: Low Risk  (06/19/2024)  Transportation Needs: No Transportation Needs (06/19/2024)  Utilities: Not At Risk (06/19/2024)  Alcohol Screen: Low Risk  (05/15/2024)  Depression (PHQ2-9): Low Risk  (03/27/2024)  Financial Resource Strain: Low Risk  (05/15/2024)  Physical Activity: Sufficiently Active (05/15/2024)  Social Connections: Moderately Integrated (06/20/2024)  Stress: No Stress Concern Present (05/15/2024)  Tobacco Use: Medium Risk (06/17/2024)    Readmission Risk Interventions    06/18/2024    3:45 PM  Readmission Risk Prevention Plan  Transportation Screening Complete  Home Care Screening Complete  Medication Review (RN CM) Complete

## 2024-06-26 NOTE — Plan of Care (Signed)
  Problem: Education: Goal: Knowledge of General Education information will improve Description: Including pain rating scale, medication(s)/side effects and non-pharmacologic comfort measures Outcome: Progressing   Problem: Clinical Measurements: Goal: Diagnostic test results will improve Outcome: Progressing   Problem: Clinical Measurements: Goal: Will remain free from infection Outcome: Progressing

## 2024-06-26 NOTE — Progress Notes (Signed)
 [facility]  PROGRESS NOTE  Philip [name] FMW:[id number] DOB: Nov 10, 1940 DOA: 06/17/2024 PCP: [name], MD   Brief HPI:   83 y.o. 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      Assessment/Plan:   Sepsis due to native mitral valve MSSA endocarditis Presented with confusion, fever, tachycardia, AKI, thrombocytopenia, and hypoxia requiring 4 L nasal cannula, all found to be due to MSSA bacteremia.  ID consulted.  TTE obtained and unremarkable but TEE showed 1.8x1.2 cm vegetation on mitral valve.   Given size of vegetation, cardiothoracic surgery was consulted, but recommended against surgery given surgical risk. 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  Neurology consulted.  LDL 25 last month, A1c <6%.  MRI findings communicated to Dr. Kerrin 8/26 - Continue Eliquis  - tPA not given because outside window - Dysphagia screen passed - PT eval ordered:  planning HHPT - Nonsmoker  - Neurology consulted, recommend to continue with Eliquis  for stroke prevention for A-fib     Left neck wound Had a recent skin graft for a Mohs surgery on left ear.  This developed a hematoma, which is resolving.  Unfortunately, the skin is now dehiscing a little bit.  Dr. Deleta  discussed this with ENT on 8/25, who were not asked to see the patient.  Given shallow depth of wound, this is low risk, will have to heal by secondary intention - Xeroform, change twice daily - Follow up with Dermatology Dr. Robina - Will need [facility] for wound care     Paroxysmal atrial fibrillation Stable - Continue Eliquis  and  metoprolol      Acute metabolic encephalopathy At baseline the patient is cognitively intact has no memory impairment.   Confusion has improved   Coronary artery disease s/p CABG 1999 at [facility] Demand ischemia Peripheral vascular disease s/p AAA stent Mildly elevated troponin on admission.  Cardiology consulted, no ischemic work up planned given situation.   Hypertension - Continue metoprolol  and Lipitor - Resume amlodipine  - Hold losartan , hydrochlorothiazide    Thrombocytopenia Normalized   AKI on CKD stage IIIa Creatinine 1.9 at the time of admission, improved to 1.1-1.3 which is his baseline    Medications     amLODipine   5 mg Oral Daily   apixaban   5 mg Oral BID   atorvastatin   40 mg Oral Daily   feeding supplement  237 mL Oral BID BM   metoprolol  tartrate  50 mg Oral BID   multivitamin with minerals  1 tablet Oral Daily   polyethylene glycol  17 g Oral Daily   sodium chloride  flush  3 mL Intravenous Q12H   white petrolatum    Topical Daily     Data Reviewed:   CBG:  Recent Labs  Lab 06/19/24 1245  GLUCAP 146*    SpO2: 97 % O2 Flow Rate (L/min): 3 L/min    Vitals:   06/25/24 2013 06/25/24 2326 06/26/24 0422 06/26/24 0719  BP: (!) 138/59 (!) 155/76 (!) 153/75 (!) 158/85  Pulse:  71 71 78  Resp: 17 18 18 18   Temp: 98.2 F (36.8 C) 98.2 F (36.8 C) 98.3 F (  36.8 C) 98 F (36.7 C)  TempSrc: Oral Oral Oral Oral  SpO2:  95% 96% 97%  Weight:   96.6 kg   Height:          Data Reviewed:  Basic Metabolic Panel: Recent Labs  Lab 06/21/24 0237 06/22/24 0312 06/24/24 0251 06/25/24 0329 06/26/24 0250  NA 139 136 135 136 137  K 3.4* 4.0 3.7 4.1 3.7  CL 104 107 103 99 100  CO2 23 24 24 26 26   GLUCOSE 117* 113* 108* 104* 110*  BUN 26* 23 19 17 18   CREATININE 1.27* 1.17 1.32* 1.17 1.32*  CALCIUM  8.3* 8.1* 9.0 9.1 8.8*    CBC: Recent Labs  Lab 06/21/24 0237 06/22/24 0312 06/24/24 0251 06/25/24 0329 06/26/24 0250  WBC 7.6 6.8 7.1  6.7 6.8  HGB 10.9* 11.8* 12.2* 12.3* 12.1*  HCT 31.2* 33.3* 35.2* 35.5* 34.6*  MCV 90.4 90.7 91.9 92.9 92.5  PLT 73* 99* 180 183 188    LFT Recent Labs  Lab 06/20/24 0322 06/21/24 0237 06/25/24 0329 06/26/24 0250  AST 99* 69* 67* 58*  ALT 26 15 25 19   ALKPHOS 37* 39 44 42  BILITOT 0.6 0.9 0.8 0.7  PROT 5.8* 5.4* 5.9* 5.8*  ALBUMIN 2.5* 2.4* 3.0* 3.1*     Antibiotics: Anti-infectives (From admission, onward)    Start     Dose/Rate Route Frequency Ordered Stop   06/25/24 1200  ceFAZolin  (ANCEF ) IVPB 2g/100 mL premix        2 g 200 mL/hr over 30 Minutes Intravenous Every 6 hours 06/25/24 0807     06/19/24 1000  vancomycin  (VANCOREADY) IVPB 1500 mg/300 mL  Status:  Discontinued        1,500 mg 150 mL/hr over 120 Minutes Intravenous Every 36 hours 06/17/24 2210 06/17/24 2237   06/18/24 1400  ceFAZolin  (ANCEF ) IVPB 2g/100 mL premix  Status:  Discontinued        2 g 200 mL/hr over 30 Minutes Intravenous Every 8 hours 06/18/24 0827 06/25/24 0807   06/18/24 0500  piperacillin -tazobactam (ZOSYN ) IVPB 3.375 g  Status:  Discontinued        3.375 g 12.5 mL/hr over 240 Minutes Intravenous Every 8 hours 06/17/24 2210 06/18/24 0827   06/17/24 2237  vancomycin  variable dose per unstable renal function (pharmacist dosing)  Status:  Discontinued         Does not apply See admin instructions 06/17/24 2237 06/18/24 0827   06/17/24 2045  vancomycin  (VANCOREADY) IVPB 2000 mg/400 mL        2,000 mg 200 mL/hr over 120 Minutes Intravenous  Once 06/17/24 2044 06/17/24 2253   06/17/24 2045  piperacillin -tazobactam (ZOSYN ) IVPB 3.375 g        3.375 g 100 mL/hr over 30 Minutes Intravenous  Once 06/17/24 2044 06/17/24 2252        DVT prophylaxis: Apixaban   Code Status: Full code  Family Communication: Discussed with patient's son at bedside   CONSULTS infectious disease, CT surgery   Subjective   Denies any complaints   Objective    Physical Examination:  General-appears in no  acute distress Heart-S1-S2, regular, no murmur auscultated Lungs-clear to auscultation bilaterally, no wheezing or crackles auscultated Abdomen-soft, nontender, no organomegaly Extremities-no edema in the lower extremities Neuro-alert, oriented x3, no focal deficit noted  Status is: Inpatient:             Sabas GORMAN Brod   [facility] If 7PM-7AM, please contact night-coverage at [email or web address], Office  [phone]  06/26/2024, 9:26 AM  LOS: 8 days

## 2024-06-27 DIAGNOSIS — R7881 Bacteremia: Secondary | ICD-10-CM | POA: Diagnosis not present

## 2024-06-27 DIAGNOSIS — I10 Essential (primary) hypertension: Secondary | ICD-10-CM | POA: Diagnosis not present

## 2024-06-27 DIAGNOSIS — I480 Paroxysmal atrial fibrillation: Secondary | ICD-10-CM | POA: Diagnosis not present

## 2024-06-27 DIAGNOSIS — B9561 Methicillin susceptible Staphylococcus aureus infection as the cause of diseases classified elsewhere: Secondary | ICD-10-CM | POA: Diagnosis not present

## 2024-06-27 DIAGNOSIS — T8149XA Infection following a procedure, other surgical site, initial encounter: Secondary | ICD-10-CM | POA: Diagnosis not present

## 2024-06-27 DIAGNOSIS — R4182 Altered mental status, unspecified: Secondary | ICD-10-CM

## 2024-06-27 MED ORDER — SODIUM CHLORIDE 0.9% FLUSH: 10.0000 mL | INTRAVENOUS | Status: DC | PRN

## 2024-06-27 MED ORDER — CHLORHEXIDINE GLUCONATE CLOTH 2 % EX PADS
6.0000 | MEDICATED_PAD | Freq: Every day | CUTANEOUS | Status: DC
  Administered 2024-06-27: 6 via TOPICAL

## 2024-06-27 MED ORDER — METOPROLOL TARTRATE 50 MG PO TABS: 50.0000 mg | ORAL_TABLET | Freq: Two times a day (BID) | ORAL | 2 refills | Status: DC

## 2024-06-27 MED ORDER — SODIUM CHLORIDE 0.9% FLUSH
10.0000 mL | Freq: Two times a day (BID) | INTRAVENOUS | Status: DC
  Administered 2024-06-27: 10 mL

## 2024-06-27 NOTE — Discharge Summary (Addendum)
 Physician Discharge Summary   Patient: Philip [name] MRN: [id number] DOB: Dec 04, 1940  Admit date:     06/17/2024  Discharge date: 06/27/24  Discharge Physician: Sabas GORMAN Brod   PCP: [name], MD   Recommendations at discharge:   Follow-up ID clinic on 07/09/2024 at 9:15 AM Patient to go home on IV cefazolin  till 07/31/2024 Patient to go home with home health PT, RN Continue wound care Follow-up dermatology as outpatient  Discharge Diagnoses: Principal Problem:   MSSA bacteremia Active Problems:   Mixed hyperlipidemia   Essential hypertension   History of skin cancer   Atrial fibrillation (HCC)   Fever   Altered mental status   Sepsis (HCC)   Surgical site infection  Resolved Problems:   * No resolved hospital problems. [facility] Course: 83 y.o. 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  Assessment and Plan:  Sepsis due to native mitral valve MSSA endocarditis Presented with confusion, fever, tachycardia, AKI, thrombocytopenia, and hypoxia requiring 4 L nasal cannula, all found to be due to MSSA bacteremia.  ID consulted.  TTE obtained and unremarkable but TEE showed 1.8x1.2 cm vegetation on mitral valve.   Given size of vegetation, cardiothoracic surgery was consulted, but recommended against surgery given surgical risk. 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  Neurology consulted.  LDL 25 last month, A1c <6%.  MRI findings communicated to Dr. Kerrin 8/26 - Continue Eliquis  - tPA not given because outside window - Dysphagia screen passed - PT eval ordered:   planning HHPT - Nonsmoker  - Neurology consulted, recommend to continue with Eliquis  for stroke prevention for A-fib     Left neck wound Had a recent skin graft for a Mohs surgery on left ear.  This developed a hematoma, which is resolving.  Unfortunately, the skin is now dehiscing a little bit.  Dr. Jonel  discussed this with ENT on 8/25, who were not asked to see the patient.  Given shallow depth of wound, this is low risk, will have to heal by secondary intention - Xeroform, change twice daily - Follow up with Dermatology Dr. Robina - Will need [facility] for wound care     Paroxysmal atrial fibrillation Stable - Continue Eliquis  and metoprolol      Acute metabolic encephalopathy At baseline the patient is cognitively intact has no memory impairment.   Confusion has improved   Coronary artery disease s/p CABG 1999 at [facility] Demand ischemia Peripheral vascular disease s/p AAA stent Mildly elevated troponin on admission.  Cardiology consulted, no ischemic work up planned given situation.   Hypertension - Continue metoprolol   - Resume amlodipine  - Hold losartan , hydrochlorothiazide    Thrombocytopenia Normalized   AKI on CKD stage IIIa Creatinine 1.9 at the time of admission, improved to 1.1-1.3 which is his baseline         Consultants: Infectious disease Procedures performed:   Disposition: Home Diet recommendation:  Discharge Diet Orders (From admission, onward)     Start     Ordered   06/27/24 0000  Diet - low sodium heart healthy  06/27/24 1357           Regular diet DISCHARGE MEDICATION: Allergies as of 06/27/2024   No Known Allergies      Medication List     STOP taking these medications    doxycycline 100 MG tablet Commonly known as: ADOXA   hydrochlorothiazide  25 MG tablet Commonly known as: HYDRODIURIL    losartan  100 MG tablet Commonly known as: COZAAR        TAKE these medications    amLODipine  10 MG tablet Commonly known  as: NORVASC  Take 1 tablet (10 mg total) by mouth at bedtime.   atorvastatin  40 MG tablet Commonly known as: LIPITOR Take 1 tablet (40 mg total) by mouth daily.   B-COMPLEX PO Take 1 tablet by mouth daily.   ceFAZolin  IVPB Commonly known as: ANCEF  Inject 6-8 g into the vein daily. Indication:  MSSA endocarditis First Dose: Yes Last Day of Therapy:  8g CI thru 9/9, then 6g CI thru 10/1 Labs - Once weekly:  CBC/D and BMP, Labs - Once weekly: ESR and CRP Method of administration: continuous infusion - 8g daily as continuous infusion thru 9/9 then 6g daily as a continuous infusion thru 10/1 Method of administration may be changed at the discretion of home infusion pharmacist based upon assessment of the patient and/or caregiver's ability to self-administer the medication ordered.   co-enzyme Q-10 30 MG capsule Take 30 mg by mouth daily.   Eliquis  5 MG Tabs tablet Generic drug: apixaban  TAKE 1 TABLET TWICE DAILY   hydrocortisone 2.5 % cream Apply 1 application topically as needed (skin irritation).   magnesium  oxide 400 MG tablet Commonly known as: MAG-OX Take 800 mg by mouth daily.   metoprolol  tartrate 50 MG tablet Commonly known as: LOPRESSOR  Take 1 tablet (50 mg total) by mouth 2 (two) times daily.   multivitamin with minerals tablet Take 1 tablet by mouth daily.   Omega-3 Fish Oil  1200 MG Caps Take 2 capsules (2,400 mg total) by mouth daily. Take 2 1,200 mg capsules daily   polyethylene glycol 17 g packet Commonly known as: MIRALAX  / GLYCOLAX  Take 17 g by mouth daily.   VITAMIN C ER PO Take 500 mg by mouth daily.               Discharge Care Instructions  (From admission, onward)           Start     Ordered   06/27/24 0000  Discharge wound care:       Comments: Wound care  Daily      Comments: 1.         Cleanse L ear  wound with NS, apply vaseline to wound bed daily, may leave open to air or cover with silicone foam or Telfa nonstick dressing and  tape if desired/oozing.      Comments: For left neck wound: Change dressing twice daily Cleanse with sterile saline, pat dry Apply Xeroform to wound Cover with Gauze Change daily   06/27/24 1357   06/26/24 0000  Change dressing on IV access line weekly and PRN  (Home infusion instructions - [facility] )        06/26/24 1539            Follow-up Information     [facility] Follow up.   Specialty: Home Health Services Why: [facility] will call you to set up apt times , [facility], HHPT) Contact information: [address] [place] [address] [phone]  [facility] Follow up.   Why: [facility] will supply the IV medication Contact information: [phone]        Philemon Cordella BIRCH, FNP Follow up.   Specialty: Infectious Diseases Why: 07/09/24 at 9:15 am. Please call to reschedule if you are not able to make this appointment. Contact information: [address] [place] [address] [phone]                Discharge Exam: Fredricka Weights   06/25/24 0358 06/26/24 0422 06/27/24 0537  Weight: 100.4 kg 96.6 kg 99.6 kg   Appears in no acute distress S1-S2, regular Lungs clear to auscultation bilaterally Abdomen is soft, nontender  Condition at discharge: good  The results of significant diagnostics from this hospitalization (including imaging, microbiology, ancillary and laboratory) are listed below for reference.   Imaging Studies: US  EKG SITE RITE Result Date: 06/26/2024 If Site Rite image not attached, placement could not be confirmed due to current cardiac rhythm.  CT ANGIO HEAD NECK W WO CM Result Date: 06/25/2024 CLINICAL DATA:  Multiple emboli EXAM: CT ANGIOGRAPHY HEAD AND NECK WITH AND WITHOUT CONTRAST TECHNIQUE: Multidetector CT imaging of the head and neck was performed using the standard protocol during bolus administration of intravenous contrast. Multiplanar CT image reconstructions  and MIPs were obtained to evaluate the vascular anatomy. Carotid stenosis measurements (when applicable) are obtained utilizing NASCET criteria, using the distal internal carotid diameter as the denominator. RADIATION DOSE REDUCTION: This exam was performed according to the departmental dose-optimization program which includes automated exposure control, adjustment of the mA and/or kV according to patient size and/or use of iterative reconstruction technique. CONTRAST:  75mL OMNIPAQUE  IOHEXOL  350 MG/ML SOLN COMPARISON:  CT same day FINDINGS: CT HEAD FINDINGS CT HEAD: Attenuation in the brain parenchyma is normal. There is no hemorrhage. No acute ischemic changes. No mass lesion. The ventricles are normal. Skull/sinuses/orbits: No significant abnormality. CTA NECK: CTA NECK Aortic arch: No proximal vessel stenosis. Right carotid: There is calcified plaque at the bifurcation without significant stenosis Left carotid: There is calcified plaque at the bifurcation without significant stenosis Right vertebral: Normal Left vertebral: Normal Soft tissues: No significant abnormality Other comments: None CTA HEAD: CTA HEAD Right anterior circulation: The internal carotid artery is patent without significant stenosis. The anterior and middle cerebral arteries are patent without significant stenosis or proximal branch occlusion. No aneurysm. Left anterior circulation: The internal carotid artery is patent without significant stenosis. The anterior and middle cerebral arteries are patent without significant stenosis or proximal branch occlusion. No aneurysm. Posterior circulation: Both vertebral arteries are patent. The basilar artery is patent without significant stenosis. There is intracranial atherosclerosis of the right posterior cerebral artery. No aneurysm. IMPRESSION: Bilateral carotid plaque without significant stenosis Intracranial atherosclerosis of the right posterior cerebral artery. No proximal embolic source  identified. No pseudoaneurysm identified. Electronically Signed   By: Nancyann Burns M.D.   On: 06/25/2024 17:06   MR BRAIN W CONTRAST Result Date: 06/25/2024 CLINICAL DATA:  Multifocal septic emboli, confusion EXAM: MRI HEAD WITH CONTRAST TECHNIQUE: Multiplanar, multiecho pulse sequences of the brain and surrounding structures were obtained with intravenous contrast. CONTRAST:  10mL GADAVIST  GADOBUTROL  1 MMOL/ML IV SOLN COMPARISON:  June 24, 2024 FINDINGS: There is a punctate focus of enhancement in the right frontal lobe. There is normal enhancement of the dural sinuses. No evidence of sinus thrombosis. There is no extra-axial fluid collection. No leptomeningeal enhancement. IMPRESSION: There is a small focus of enhancement  in the right frontal lobe at the site of 1 of the embolic infarcts noted on the prior study. There is no evidence of brain abscess, meningitis, empyema or dural sinus thrombosis Electronically Signed   By: Nancyann Burns M.D.   On: 06/25/2024 16:56   MR BRAIN WO CONTRAST Result Date: 06/25/2024 CLINICAL DATA:  Initial evaluation for mental status change, unknown cause. EXAM: MRI HEAD WITHOUT CONTRAST TECHNIQUE: Multiplanar, multiecho pulse sequences of the brain and surrounding structures were obtained without intravenous contrast. COMPARISON:  CT from 06/17/2024. FINDINGS: Brain: Cerebral volume within normal limits for age. Mild T2/FLAIR signal intensity involving the periventricular white matter, most characteristic of chronic microvascular ischemic disease, mild for age. Multiple scattered predominantly subcentimeter foci of restricted diffusion are seen involving the bilateral cerebral and cerebellar hemispheres, consistent with acute to early subacute ischemic infarcts. The largest focus of ischemia seen involving the inferior left caudate head and measures 9 mm (series 5, image 86). Mild patchy involvement of the left pons (series 5, images 77, 76). No associated hemorrhage or  significant mass effect. A central thromboembolic etiology is likely. Gray-white matter differentiation otherwise maintained. No other acute or chronic intracranial blood products. No mass lesion, midline shift or mass effect. No hydrocephalus or extra-axial fluid collection. Pituitary gland within normal limits. Vascular: Major intracranial vascular flow voids are maintained. Skull and upper cervical spine: Craniocervical junction within normal limits. Bone marrow signal intensity overall within normal limits. No scalp soft tissue abnormality. Sinuses/Orbits: Prior bilateral ocular lens replacement. Paranasal sinuses are largely clear. No significant mastoid effusion. Other: None. IMPRESSION: 1. Multiple scattered subcentimeter acute to early subacute ischemic infarcts involving the bilateral cerebral and cerebellar hemispheres as well as the left pons. No associated hemorrhage or significant mass effect. Given the various vascular distributions involved, a central thromboembolic etiology is likely. 2. Underlying mild chronic microvascular ischemic disease. Electronically Signed   By: Morene Hoard M.D.   On: 06/25/2024 06:02   ECHO TEE Result Date: 06/24/2024    TRANSESOPHOGEAL ECHO REPORT   Patient Name:   DEZMON CONOVER Starkes Date of Exam: 06/24/2024 Medical Rec #:  [id number]      Height:       69.0 in Accession #:    [id number]     Weight:       229.7 lb Date of Birth:  1940/12/04      BSA:          2.191 m Patient Age:    83 years       BP:           166/89 mmHg Patient Gender: M              HR:           77 bpm. Exam Location:  Inpatient Procedure: Transesophageal Echo, 3D Echo, Cardiac Doppler and Color Doppler            (Both Spectral and Color Flow Doppler were utilized during            procedure). Indications:     Endocarditis  History:         Patient has prior history of Echocardiogram examinations, most                  recent 06/18/2024. Arrythmias:Atrial Fibrillation; Risk                   Factors:Hypertension, Dyslipidemia and Sleep Apnea.  Sonographer:     Damien Senior RDCS  Referring Phys:  [id number] ROLLO JONELLE [name] Diagnosing Phys: Soyla Merck MD  Sonographer Comments: Patient had severe obstructive breathing pattern during TEE, complicating imaging windows. PROCEDURE: After discussion of the risks and benefits of a TEE, an informed consent was obtained from the patient. TEE procedure time was 10 minutes. The transesophogeal probe was passed without difficulty through the esophogus of the patient. Imaged were obtained with the patient in a left lateral decubitus position. Sedation performed by different physician. The patient was monitored while under deep sedation. Anesthestetic sedation was provided intravenously by Anesthesiology: 301mg  of Propofol , 100mg  of Lidocaine . Image quality was good. The patient's vital signs; including heart rate, blood pressure, and oxygen saturation; remained stable throughout the procedure. The patient developed no complications during the procedure.  IMPRESSIONS  1. There is a large, mobile vegetation measuring 1.9 x 1.2cm attached to the atrial aspect of the mitral valve, likely adherent to a shelf of mitral annular calcification on the posterior annulus, adjacent to A3/P3 scallop. Evidence of small leaflet perforation at site of vegetation with trivial to mild additional jet MR at site of perforation. In total, MR appears mild-moderate. The mitral valve is degenerative. Mild to moderate mitral valve regurgitation. Mild mitral stenosis. The mean mitral valve gradient is 6.0 mmHg with average heart rate of 82 bpm. Moderate to severe mitral annular calcification.  2. Left ventricular ejection fraction, by estimation, is 55 to 60%. The left ventricle has normal function.  3. Right ventricular systolic function is normal. The right ventricular size is normal.  4. Left atrial size was mildly dilated. No left atrial/left atrial appendage thrombus was detected. The  LAA emptying velocity was 44 cm/s.  5. Right atrial size was mildly dilated.  6. The aortic valve is grossly normal. Aortic valve regurgitation is trivial.  7. There is Moderate (Grade III) atheroma plaque involving the aortic arch and descending aorta.  8. 3D performed of the mitral valve and demonstrates mitral valve vegetation. Conclusion(s)/Recommendation(s): Critical findings reported to Dr. Bentley. Wendel and acknowledged at 06/24/24 at 1500. FINDINGS  Left Ventricle: Left ventricular ejection fraction, by estimation, is 55 to 60%. The left ventricle has normal function. The left ventricular internal cavity size was normal in size. Right Ventricle: The right ventricular size is normal. No increase in right ventricular wall thickness. Right ventricular systolic function is normal. Left Atrium: Left atrial size was mildly dilated. No left atrial/left atrial appendage thrombus was detected. The LAA emptying velocity was 44 cm/s. Right Atrium: Right atrial size was mildly dilated. Pericardium: There is no evidence of pericardial effusion. Mitral Valve: There is a large, mobile vegetation measuring 1.9 x 1.2cm attached to the atrial aspect of the mitral valve, likely adherent to a shelf of mitral annular calcification on the posterior annulus, adjacent to A3/P3 scallop. Evidence of small leaflet perforation at site of vegetation with trivial to mild additional jet MR at site of perforation. In total, MR appears mild-moderate. The mitral valve is degenerative in appearance. Moderate to severe mitral annular calcification. Mild to moderate  mitral valve regurgitation. Mild mitral valve stenosis. MV peak gradient, 10.6 mmHg. The mean mitral valve gradient is 6.0 mmHg with average heart rate of 82 bpm. Tricuspid Valve: The tricuspid valve is grossly normal. Tricuspid valve regurgitation is trivial. Aortic Valve: The aortic valve is grossly normal. Aortic valve regurgitation is trivial. Pulmonic Valve: The pulmonic  valve was grossly normal. Pulmonic valve regurgitation is trivial. Aorta: The aortic root and ascending aorta are structurally normal, with no  evidence of dilitation. There is moderate (Grade III) atheroma plaque involving the aortic arch and descending aorta. Venous: A normal flow pattern is recorded from the left upper pulmonary vein. IAS/Shunts: The interatrial septum appears to be lipomatous. No atrial level shunt detected by color flow Doppler. Additional Comments: 3D was performed not requiring image post processing on an independent workstation and was abnormal. Spectral Doppler performed.  AORTA Ao Root diam: 3.60 cm Ao Asc diam:  3.70 cm MITRAL VALVE MV Peak grad: 10.6 mmHg MV Mean grad: 6.0 mmHg MV Vmax:      1.63 m/s MV Vmean:     123.0 cm/s Soyla Merck MD Electronically signed by Soyla Merck MD Signature Date/Time: 06/24/2024/4:06:04 PM    Final    EP STUDY Result Date: 06/24/2024 See surgical note for result.  DG Chest Port 1 View Result Date: 06/19/2024 CLINICAL DATA:  33497 Acute respiratory distress 66502 EXAM: PORTABLE CHEST 1 VIEW COMPARISON:  06/17/2024. FINDINGS: The heart size and mediastinal contours are unchanged. Prior median sternotomy and CABG. Aortic atherosclerosis. Low lung volumes. No focal consolidation, pleural effusion, or pneumothorax. No acute osseous abnormality. IMPRESSION: No significant change from the prior exam.  Stable chest. Electronically Signed   By: Harrietta Sherry M.D.   On: 06/19/2024 13:39   ECHOCARDIOGRAM COMPLETE Result Date: 06/18/2024    ECHOCARDIOGRAM REPORT   Patient Name:   LANNIS LICHTENWALNER Zanetti Date of Exam: 06/18/2024 Medical Rec #:  [id number]      Height:       69.0 in Accession #:    [id number]     Weight:       234.3 lb Date of Birth:  18-Feb-1941      BSA:          2.210 m Patient Age:    83 years       BP:           104/59 mmHg Patient Gender: M              HR:           101 bpm. Exam Location:  Inpatient Procedure: 2D Echo, Color Doppler,  Cardiac Doppler and Intracardiac            Opacification Agent (Both Spectral and Color Flow Doppler were            utilized during procedure). Indications:    Elevated troponin  History:        Patient has prior history of Echocardiogram examinations, most                 recent 10/07/2021. CHF, Previous Myocardial Infarction and CAD,                 Arrythmias:Atrial Fibrillation; Risk Factors:Hypertension and                 Dyslipidemia.  Sonographer:    Koleen Popper RDCS Referring Phys: [id number] [facility]  Sonographer Comments: Technically difficult study due to poor echo windows. IMPRESSIONS  1. Left ventricular ejection fraction, by estimation, is 50%. The left ventricle has mildly decreased function. Left ventricular endocardial border not optimally defined to evaluate regional wall motion. There is moderate concentric left ventricular hypertrophy. Indeterminate diastolic filling due to E-A fusion.  2. Right ventricular systolic function was not well visualized. The right ventricular size is not well visualized. Tricuspid regurgitation signal is inadequate for assessing PA pressure.  3. Left atrial size was mild to moderately dilated.  4. The  mitral valve is degenerative. No evidence of mitral valve regurgitation. Mild mitral stenosis. The mean mitral valve gradient is 4.6 mmHg with average heart rate of 99 bpm. Severe mitral annular calcification.  5. The aortic valve is tricuspid. There is mild calcification of the aortic valve. Aortic valve regurgitation is not visualized. Aortic valve sclerosis/calcification is present, without any evidence of aortic stenosis. Comparison(s): Prior images reviewed side by side. The left ventricular function is worsened. Technically poor quality study, even after administration of Definity  contrast, further challenged due to incessant ectopy. Suspect there is moderate hypokinesis of the basal inferolateral wall. FINDINGS  Left Ventricle: Left ventricular  ejection fraction, by estimation, is 50%. The left ventricle has mildly decreased function. Left ventricular endocardial border not optimally defined to evaluate regional wall motion. Definity  contrast agent was given IV  to delineate the left ventricular endocardial borders. The left ventricular internal cavity size was normal in size. There is moderate concentric left ventricular hypertrophy. Indeterminate diastolic filling due to E-A fusion. Right Ventricle: The right ventricular size is not well visualized. Right vetricular wall thickness was not well visualized. Right ventricular systolic function was not well visualized. Tricuspid regurgitation signal is inadequate for assessing PA pressure. Left Atrium: Left atrial size was mild to moderately dilated. Right Atrium: Right atrial size was normal in size. Pericardium: There is no evidence of pericardial effusion. Mitral Valve: The mitral valve is degenerative in appearance. Severe mitral annular calcification. No evidence of mitral valve regurgitation. Mild mitral valve stenosis. The mean mitral valve gradient is 4.6 mmHg with average heart rate of 99 bpm. Tricuspid Valve: The tricuspid valve is grossly normal. Tricuspid valve regurgitation is not demonstrated. Aortic Valve: The aortic valve is tricuspid. There is mild calcification of the aortic valve. Aortic valve regurgitation is not visualized. Aortic valve sclerosis/calcification is present, without any evidence of aortic stenosis. Pulmonic Valve: The pulmonic valve was not well visualized. Pulmonic valve regurgitation is trivial. No evidence of pulmonic stenosis. Aorta: The aortic root and ascending aorta are structurally normal, with no evidence of dilitation. IAS/Shunts: The interatrial septum was not well visualized.  LEFT VENTRICLE PLAX 2D LVIDd:         5.20 cm LVIDs:         4.30 cm LV PW:         1.50 cm LV IVS:        1.60 cm LVOT diam:     1.90 cm LV SV:         41 LV SV Index:   18 LVOT Area:      2.84 cm  LEFT ATRIUM             Index LA diam:        4.50 cm 2.04 cm/m LA Vol (A2C):   65.3 ml 29.55 ml/m LA Vol (A4C):   72.1 ml 32.62 ml/m LA Biplane Vol: 69.5 ml 31.45 ml/m  AORTIC VALVE LVOT Vmax:   99.70 cm/s LVOT Vmean:  63.900 cm/s LVOT VTI:    0.143 m  AORTA Ao Root diam: 3.00 cm Ao Asc diam:  3.50 cm MITRAL VALVE MV Area (PHT): 2.44 cm  SHUNTS MV Mean grad:  4.6 mmHg  Systemic VTI:  0.14 m MV Decel Time: 310 msec  Systemic Diam: 1.90 cm Jerel Croitoru MD Electronically signed by Jerel Balding MD Signature Date/Time: 06/18/2024/3:49:24 PM    Final    CT SOFT TISSUE NECK W CONTRAST Result Date: 06/17/2024 CLINICAL DATA:  fever, rule out neck  infection. EXAM: CT NECK WITH CONTRAST TECHNIQUE: Multidetector CT imaging of the neck was performed using the standard protocol following the bolus administration of intravenous contrast. RADIATION DOSE REDUCTION: This exam was performed according to the departmental dose-optimization program which includes automated exposure control, adjustment of the mA and/or kV according to patient size and/or use of iterative reconstruction technique. CONTRAST:  50mL OMNIPAQUE  IOHEXOL  350 MG/ML SOLN COMPARISON:  None Available. FINDINGS: Pharynx and larynx: Normal. No mass or swelling. Salivary glands: No inflammation, mass, or stone. Thyroid : Normal. Lymph nodes: None enlarged or abnormal density. Vascular: Limited evaluation due to non arterial contrast timing. Bulky calcific atherosclerosis at the carotid bifurcations. Limited intracranial: Negative. Visualized orbits: Negative. Mastoids and visualized paranasal sinuses: Clear. Skeleton: No acute or aggressive process. Upper chest: Lung apices are clear. Other: Mild thickening of the left sternocleidomastoid in the upper neck with adjacent soft tissue thickening and skin irregularity, likely the source of recent graft. Milder edema and thickening of the sternocleidomastoid in the lower neck. IMPRESSION: 1. Mild  thickening of the left sternocleidomastoid in the upper neck with adjacent soft tissue thickening and skin irregularity, likely the source of recent graft. Milder edema and thickening of the sternocleidomastoid in the lower neck. 2. Otherwise, no evidence of acute abnormality. 3. Bulky calcific atherosclerosis at the carotid bifurcations. Electronically Signed   By: Gilmore GORMAN Molt M.D.   On: 06/17/2024 23:04   CT Head Wo Contrast Result Date: 06/17/2024 CLINICAL DATA:  Altered level of consciousness EXAM: CT HEAD WITHOUT CONTRAST TECHNIQUE: Contiguous axial images were obtained from the base of the skull through the vertex without intravenous contrast. RADIATION DOSE REDUCTION: This exam was performed according to the departmental dose-optimization program which includes automated exposure control, adjustment of the mA and/or kV according to patient size and/or use of iterative reconstruction technique. COMPARISON:  10/06/2021 FINDINGS: Brain: No acute infarct or hemorrhage. Lateral ventricles and midline structures are unremarkable. No acute extra-axial fluid collections. No mass effect. Vascular: No hyperdense vessel or unexpected calcification. Skull: Normal. Negative for fracture or focal lesion. Sinuses/Orbits: No acute finding. Other: None. IMPRESSION: 1. No acute intracranial process. Electronically Signed   By: Ozell Daring M.D.   On: 06/17/2024 18:24   DG Chest Port 1 View Result Date: 06/17/2024 CLINICAL DATA:  Sepsis EXAM: PORTABLE CHEST 1 VIEW COMPARISON:  10/06/2021 FINDINGS: 2 frontal views of the chest demonstrate stable enlargement the cardiac silhouette and postsurgical changes from CABG. No acute airspace disease, effusion, or pneumothorax. There are no acute bony abnormalities. IMPRESSION: 1. Stable chest, no acute process. Electronically Signed   By: Ozell Daring M.D.   On: 06/17/2024 17:38    Microbiology: Results for orders placed or performed during the hospital encounter of  06/17/24  Blood Culture (routine x 2)     Status: Abnormal   Collection Time: 06/17/24  5:16 PM   Specimen: BLOOD LEFT FOREARM  Result Value Ref Range Status   Specimen Description BLOOD LEFT FOREARM  Final   Special Requests   Final    BOTTLES DRAWN AEROBIC AND ANAEROBIC Blood Culture adequate volume   Culture  Setup Time   Final    GRAM POSITIVE COCCI IN BOTH AEROBIC AND ANAEROBIC BOTTLES CRITICAL VALUE NOTED.  VALUE IS CONSISTENT WITH PREVIOUSLY REPORTED AND CALLED VALUE.    Culture (A)  Final    STAPHYLOCOCCUS AUREUS SUSCEPTIBILITIES PERFORMED ON PREVIOUS CULTURE WITHIN THE LAST 5 DAYS. Performed at [facility], [address], [place], [place] [address]  Report Status 06/20/2024 FINAL  Final  Blood Culture (routine x 2)     Status: Abnormal   Collection Time: 06/17/24  5:20 PM   Specimen: BLOOD RIGHT FOREARM  Result Value Ref Range Status   Specimen Description BLOOD RIGHT FOREARM  Final   Special Requests   Final    BOTTLES DRAWN AEROBIC AND ANAEROBIC Blood Culture adequate volume   Culture  Setup Time   Final    GRAM POSITIVE COCCI IN BOTH AEROBIC AND ANAEROBIC BOTTLES CRITICAL RESULT CALLED TO, READ BACK BY AND VERIFIED WITH: PHARMD E.MARTIN AT 9180 ON 06/18/2024 BY T.SAAD. Performed at [facility], [address] [place], [place] [address]    Culture STAPHYLOCOCCUS AUREUS (A)  Final   Report Status 06/20/2024 FINAL  Final   Organism ID, Bacteria STAPHYLOCOCCUS AUREUS  Final      Susceptibility   Staphylococcus aureus - MIC*    CIPROFLOXACIN <=0.5 SENSITIVE Sensitive     ERYTHROMYCIN <=0.25 SENSITIVE Sensitive     GENTAMICIN <=0.5 SENSITIVE Sensitive     OXACILLIN 0.5 SENSITIVE Sensitive     TETRACYCLINE >=16 RESISTANT Resistant     VANCOMYCIN  <=0.5 SENSITIVE Sensitive     TRIMETH/SULFA >=320 RESISTANT Resistant     CLINDAMYCIN <=0.25 SENSITIVE Sensitive     RIFAMPIN <=0.5 SENSITIVE Sensitive     Inducible Clindamycin NEGATIVE Sensitive      LINEZOLID 2 SENSITIVE Sensitive     * STAPHYLOCOCCUS AUREUS  Blood Culture ID Panel (Reflexed)     Status: Abnormal   Collection Time: 06/17/24  5:20 PM  Result Value Ref Range Status   Enterococcus faecalis NOT DETECTED NOT DETECTED Final   Enterococcus Faecium NOT DETECTED NOT DETECTED Final   Listeria monocytogenes NOT DETECTED NOT DETECTED Final   Staphylococcus species DETECTED (A) NOT DETECTED Final    Comment: CRITICAL RESULT CALLED TO, READ BACK BY AND VERIFIED WITH: PHARMD E.MARTIN AT 9180 ON 06/18/2024 BY T.SAAD.    Staphylococcus aureus (BCID) DETECTED (A) NOT DETECTED Final    Comment: CRITICAL RESULT CALLED TO, READ BACK BY AND VERIFIED WITH: PHARMD E.MARTIN AT 9180 ON 06/18/2024 BY T.SAAD.    Staphylococcus epidermidis NOT DETECTED NOT DETECTED Final   Staphylococcus lugdunensis NOT DETECTED NOT DETECTED Final   Streptococcus species NOT DETECTED NOT DETECTED Final   Streptococcus agalactiae NOT DETECTED NOT DETECTED Final   Streptococcus pneumoniae NOT DETECTED NOT DETECTED Final   Streptococcus pyogenes NOT DETECTED NOT DETECTED Final   A.calcoaceticus-baumannii NOT DETECTED NOT DETECTED Final   Bacteroides fragilis NOT DETECTED NOT DETECTED Final   Enterobacterales NOT DETECTED NOT DETECTED Final   Enterobacter cloacae complex NOT DETECTED NOT DETECTED Final   Escherichia coli NOT DETECTED NOT DETECTED Final   Klebsiella aerogenes NOT DETECTED NOT DETECTED Final   Klebsiella oxytoca NOT DETECTED NOT DETECTED Final   Klebsiella pneumoniae NOT DETECTED NOT DETECTED Final   Proteus species NOT DETECTED NOT DETECTED Final   Salmonella species NOT DETECTED NOT DETECTED Final   Serratia marcescens NOT DETECTED NOT DETECTED Final   Haemophilus influenzae NOT DETECTED NOT DETECTED Final   Neisseria meningitidis NOT DETECTED NOT DETECTED Final   Pseudomonas aeruginosa NOT DETECTED NOT DETECTED Final   Stenotrophomonas maltophilia NOT DETECTED NOT DETECTED Final   Candida  albicans NOT DETECTED NOT DETECTED Final   Candida auris NOT DETECTED NOT DETECTED Final   Candida glabrata NOT DETECTED NOT DETECTED Final   Candida krusei NOT DETECTED NOT DETECTED Final   Candida parapsilosis NOT DETECTED  NOT DETECTED Final   Candida tropicalis NOT DETECTED NOT DETECTED Final   Cryptococcus neoformans/gattii NOT DETECTED NOT DETECTED Final   Meth resistant mecA/C and MREJ NOT DETECTED NOT DETECTED Final    Comment: Performed at [facility], [address], [place], [place] [address]  Resp panel by RT-PCR (RSV, Flu A&B, Covid) Anterior Nasal Swab     Status: None   Collection Time: 06/18/24  8:58 AM   Specimen: Anterior Nasal Swab  Result Value Ref Range Status   SARS Coronavirus 2 by RT PCR NEGATIVE NEGATIVE Final   Influenza A by PCR NEGATIVE NEGATIVE Final   Influenza B by PCR NEGATIVE NEGATIVE Final    Comment: (NOTE) The Xpert Xpress SARS-CoV-2/FLU/RSV plus assay is intended as an aid in the diagnosis of influenza from Nasopharyngeal swab specimens and should not be used as a sole basis for treatment. Nasal washings and aspirates are unacceptable for Xpert Xpress SARS-CoV-2/FLU/RSV testing.  Fact Sheet for Patients: BloggerCourse.com  Fact Sheet for Healthcare Providers: SeriousBroker.it  This test is not yet approved or cleared by the [place]  FDA and has been authorized for detection and/or diagnosis of SARS-CoV-2 by FDA under an Emergency Use Authorization (EUA). This EUA will remain in effect (meaning this test can be used) for the duration of the COVID-19 declaration under Section 564(b)(1) of the Act, 21 U.S.C. section 360bbb-3(b)(1), unless the authorization is terminated or revoked.     Resp Syncytial Virus by PCR NEGATIVE NEGATIVE Final    Comment: (NOTE) Fact Sheet for Patients: BloggerCourse.com  Fact Sheet for Healthcare  Providers: SeriousBroker.it  This test is not yet approved or cleared by the [place]  FDA and has been authorized for detection and/or diagnosis of SARS-CoV-2 by FDA under an Emergency Use Authorization (EUA). This EUA will remain in effect (meaning this test can be used) for the duration of the COVID-19 declaration under Section 564(b)(1) of the Act, 21 U.S.C. section 360bbb-3(b)(1), unless the authorization is terminated or revoked.  Performed at [facility], [address] [place], [place] [address]   Culture, blood (Routine X 2) w Reflex to ID Panel     Status: None   Collection Time: 06/19/24  6:40 AM   Specimen: BLOOD RIGHT ARM  Result Value Ref Range Status   Specimen Description BLOOD RIGHT ARM  Final   Special Requests   Final    BOTTLES DRAWN AEROBIC AND ANAEROBIC Blood Culture adequate volume   Culture   Final    NO GROWTH 5 DAYS Performed at [facility], [address] [place], [place] [address]    Report Status 06/24/2024 FINAL  Final  Culture, blood (Routine X 2) w Reflex to ID Panel     Status: None   Collection Time: 06/19/24  6:54 AM   Specimen: BLOOD LEFT ARM  Result Value Ref Range Status   Specimen Description BLOOD LEFT ARM  Final   Special Requests   Final    BOTTLES DRAWN AEROBIC AND ANAEROBIC Blood Culture adequate volume   Culture   Final    NO GROWTH 5 DAYS Performed at [facility], [address] [place], [place] [address]    Report Status 06/24/2024 FINAL  Final    Labs: CBC: Recent Labs  Lab 06/21/24 0237 06/22/24 0312 06/24/24 0251 06/25/24 0329 06/26/24 0250  WBC 7.6 6.8 7.1 6.7 6.8  HGB 10.9* 11.8* 12.2* 12.3* 12.1*  HCT 31.2* 33.3* 35.2* 35.5* 34.6*  MCV  90.4 90.7 91.9 92.9 92.5  PLT 73* 99* 180 183 188   Basic Metabolic Panel: Recent Labs  Lab 06/21/24 0237 06/22/24 0312 06/24/24 0251 06/25/24 0329 06/26/24 0250  NA 139 136 135 136 137  K 3.4* 4.0 3.7 4.1 3.7  CL  104 107 103 99 100  CO2 23 24 24 26 26   GLUCOSE 117* 113* 108* 104* 110*  BUN 26* 23 19 17 18   CREATININE 1.27* 1.17 1.32* 1.17 1.32*  CALCIUM  8.3* 8.1* 9.0 9.1 8.8*   Liver Function Tests: Recent Labs  Lab 06/21/24 0237 06/25/24 0329 06/26/24 0250  AST 69* 67* 58*  ALT 15 25 19   ALKPHOS 39 44 42  BILITOT 0.9 0.8 0.7  PROT 5.4* 5.9* 5.8*  ALBUMIN 2.4* 3.0* 3.1*   CBG: No results for input(s): GLUCAP in the last 168 hours.  Discharge time spent: greater than 30 minutes.  Signed: Sabas GORMAN Brod, MD [facility] 06/27/2024

## 2024-06-27 NOTE — Progress Notes (Incomplete)
 [facility]  PROGRESS NOTE  Philip [name] DOB: 1941-09-01 DOA: 06/17/2024 PCP: [name], MD   Brief HPI:   83 y.o. 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      Assessment/Plan:   Sepsis due to native mitral valve MSSA endocarditis Presented with confusion, fever, tachycardia, AKI, thrombocytopenia, and hypoxia requiring 4 L nasal cannula, all found to be due to MSSA bacteremia.  ID consulted.  TTE obtained and unremarkable but TEE showed 1.8x1.2 cm vegetation on mitral valve.   Given size of vegetation, cardiothoracic surgery was consulted, but recommended against surgery given surgical risk. 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  Neurology consulted.  LDL 25 last month, A1c <6%.  MRI findings communicated to Dr. Kerrin 8/26 - Continue Eliquis  - tPA not given because outside window - Dysphagia screen passed - PT eval ordered:  planning HHPT - Nonsmoker  - Neurology consulted, recommend to continue with Eliquis  for stroke prevention for A-fib     Left neck wound Had a recent skin graft for a Mohs surgery on left ear.  This developed a hematoma, which is resolving.  Unfortunately, the skin is now dehiscing a little bit.  Dr. Deleta  discussed this with ENT on 8/25, who were not asked to see the patient.  Given shallow depth of wound, this is low risk, will have to heal by secondary intention - Xeroform, change twice daily - Follow up with Dermatology Dr. Robina - Will need [facility] for wound care     Paroxysmal atrial fibrillation Stable - Continue Eliquis  and  metoprolol      Acute metabolic encephalopathy At baseline the patient is cognitively intact has no memory impairment.   Confusion has improved   Coronary artery disease s/p CABG 1999 at [facility] Demand ischemia Peripheral vascular disease s/p AAA stent Mildly elevated troponin on admission.  Cardiology consulted, no ischemic work up planned given situation.   Hypertension - Continue metoprolol  and Lipitor - Resume amlodipine  - Hold losartan , hydrochlorothiazide    Thrombocytopenia Normalized   AKI on CKD stage IIIa Creatinine 1.9 at the time of admission, improved to 1.1-1.3 which is his baseline    Medications     amLODipine   5 mg Oral Daily   apixaban   5 mg Oral BID   atorvastatin   40 mg Oral Daily   feeding supplement  237 mL Oral BID BM   metoprolol  tartrate  50 mg Oral BID   multivitamin with minerals  1 tablet Oral Daily   polyethylene glycol  17 g Oral Daily   sodium chloride  flush  3 mL Intravenous Q12H   white petrolatum    Topical Daily     Data Reviewed:   CBG:  No results for input(s): GLUCAP in the last 168 hours.   SpO2: 97 % O2 Flow Rate (L/min): 3 L/min    Vitals:   06/27/24 0000 06/27/24 0521 06/27/24 0537 06/27/24 0734  BP: 138/74 (!) 152/73  134/72  Pulse: 67 73  72  Resp: 18 18  18   Temp: 98.1 F (36.7 C) 98.2 F (36.8 C)  98.1 F (36.7 C)  TempSrc: Oral Oral  Oral  SpO2: 97% 96%  97%  Weight:   99.6 kg   Height:          Data Reviewed:  Basic Metabolic Panel: Recent Labs  Lab 06/21/24 0237 06/22/24 0312 06/24/24 0251 06/25/24 0329 06/26/24 0250  NA 139 136 135 136 137  K 3.4* 4.0 3.7 4.1 3.7  CL 104 107 103 99 100  CO2 23 24 24 26 26   GLUCOSE 117* 113* 108* 104* 110*  BUN 26* 23 19 17 18   CREATININE 1.27* 1.17 1.32* 1.17 1.32*  CALCIUM  8.3* 8.1* 9.0 9.1 8.8*    CBC: Recent Labs  Lab 06/21/24 0237 06/22/24 0312 06/24/24 0251 06/25/24 0329 06/26/24 0250  WBC 7.6 6.8 7.1 6.7 6.8  HGB 10.9* 11.8* 12.2*  12.3* 12.1*  HCT 31.2* 33.3* 35.2* 35.5* 34.6*  MCV 90.4 90.7 91.9 92.9 92.5  PLT 73* 99* 180 183 188    LFT Recent Labs  Lab 06/21/24 0237 06/25/24 0329 06/26/24 0250  AST 69* 67* 58*  ALT 15 25 19   ALKPHOS 39 44 42  BILITOT 0.9 0.8 0.7  PROT 5.4* 5.9* 5.8*  ALBUMIN 2.4* 3.0* 3.1*     Antibiotics: Anti-infectives (From admission, onward)    Start     Dose/Rate Route Frequency Ordered Stop   06/26/24 0000  ceFAZolin  (ANCEF ) IVPB        6-8 g Intravenous Every 24 hours 06/26/24 1539 07/31/24 2359   06/25/24 1200  ceFAZolin  (ANCEF ) IVPB 2g/100 mL premix        2 g 200 mL/hr over 30 Minutes Intravenous Every 6 hours 06/25/24 0807     06/19/24 1000  vancomycin  (VANCOREADY) IVPB 1500 mg/300 mL  Status:  Discontinued        1,500 mg 150 mL/hr over 120 Minutes Intravenous Every 36 hours 06/17/24 2210 06/17/24 2237   06/18/24 1400  ceFAZolin  (ANCEF ) IVPB 2g/100 mL premix  Status:  Discontinued        2 g 200 mL/hr over 30 Minutes Intravenous Every 8 hours 06/18/24 0827 06/25/24 0807   06/18/24 0500  piperacillin -tazobactam (ZOSYN ) IVPB 3.375 g  Status:  Discontinued        3.375 g 12.5 mL/hr over 240 Minutes Intravenous Every 8 hours 06/17/24 2210 06/18/24 0827   06/17/24 2237  vancomycin  variable dose per unstable renal function (pharmacist dosing)  Status:  Discontinued         Does not apply See admin instructions 06/17/24 2237 06/18/24 0827   06/17/24 2045  vancomycin  (VANCOREADY) IVPB 2000 mg/400 mL        2,000 mg 200 mL/hr over 120 Minutes Intravenous  Once 06/17/24 2044 06/17/24 2253   06/17/24 2045  piperacillin -tazobactam (ZOSYN ) IVPB 3.375 g        3.375 g 100 mL/hr over 30 Minutes Intravenous  Once 06/17/24 2044 06/17/24 2252        DVT prophylaxis: Apixaban   Code Status: Full code  Family Communication: Discussed with patient's son at bedside   CONSULTS infectious disease, CT surgery   Subjective      Objective    Physical  Examination:    Status is: Inpatient:             Philip [name] Brod   [facility] If 7PM-7AM, please contact night-coverage at [email or web address], Office  [phone]   06/27/2024, 8:37 AM  LOS: 9 days

## 2024-06-27 NOTE — TOC Progression Note (Signed)
 Transition of Care [facility]) - Progression Note    Patient Details  Name: MCIHAEL HINDERMAN MRN: [id number] Date of Birth: [birth year]  Transition of Care [facility]) CM/SW Contact  Waddell Barnie Rama, RN Phone Number: 06/27/2024, 1:44 PM  Clinical Narrative:    NCM spoke with Holley Herring, she is at the pharmacy picking up the IV medication , she will then come to hospital to work with patient to get him dc home with wife. Cory with [facility] is aware also.                     Expected Discharge Plan and Services                                               Social Drivers of Health (SDOH) Interventions SDOH Screenings   Food Insecurity: No Food Insecurity (06/19/2024)  Housing: Low Risk  (06/19/2024)  Transportation Needs: No Transportation Needs (06/19/2024)  Utilities: Not At Risk (06/19/2024)  Alcohol Screen: Low Risk  (05/15/2024)  Depression (PHQ2-9): Low Risk  (03/27/2024)  Financial Resource Strain: Low Risk  (05/15/2024)  Physical Activity: Sufficiently Active (05/15/2024)  Social Connections: Moderately Integrated (06/20/2024)  Stress: No Stress Concern Present (05/15/2024)  Tobacco Use: Medium Risk (06/17/2024)    Readmission Risk Interventions    06/18/2024    3:45 PM  Readmission Risk Prevention Plan  Transportation Screening Complete  Home Care Screening Complete  Medication Review (RN CM) Complete

## 2024-06-27 NOTE — Progress Notes (Signed)
 Peripherally Inserted Central Catheter Placement  The IV Nurse has discussed with the patient and/or persons authorized to consent for the patient, the purpose of this procedure and the potential benefits and risks involved with this procedure.  The benefits include less needle sticks, lab draws from the catheter, and the patient may be discharged home with the catheter. Risks include, but not limited to, infection, bleeding, blood clot (thrombus formation), and puncture of an artery; nerve damage and irregular heartbeat and possibility to perform a PICC exchange if needed/ordered by physician.  Alternatives to this procedure were also discussed.  Bard Power PICC patient education guide, fact sheet on infection prevention and patient information card has been provided to patient /or left at bedside.    PICC Placement Documentation  PICC Single Lumen 06/27/24 Right Brachial 40 cm 0 cm (Active)  Indication for Insertion or Continuance of Line Home intravenous therapies (PICC only) 06/27/24 1056  Exposed Catheter (cm) 0 cm 06/27/24 1056  Site Assessment Clean, Dry, Intact 06/27/24 1056  Line Status Flushed;Saline locked;Blood return noted 06/27/24 1056  Dressing Type Transparent;Securing device 06/27/24 1056  Dressing Status Antimicrobial disc/dressing in place;Clean, Dry, Intact 06/27/24 1056  Line Care Connections checked and tightened 06/27/24 1056  Line Adjustment (NICU/IV Team Only) No 06/27/24 1056  Dressing Intervention New dressing;Adhesive placed at insertion site (IV team only) 06/27/24 1056  Dressing Change Due 07/04/24 06/27/24 1056       Renaee Notice Albarece 06/27/2024, 10:58 AM

## 2024-06-27 NOTE — Progress Notes (Addendum)
 Pt has DC order, AVS was given and explained to pt and wife and son, all questions were answered. Education on dressing change was done as well, supplies provided. Pending education on IV infusion, sending pt with PICC. Family to provide transport.  0415pm: Called ED/Security for the missing [employer] of the pt Gray/Blue size 11.

## 2024-06-27 NOTE — Progress Notes (Signed)
 PT Cancellation Note  Patient Details Name: Philip [name] MRN: [id number] DOB: [birth year]   Cancelled Treatment:    Reason Eval/Treat Not Completed: Other (comment) (RN deferrred at this time stating pending PIcc line placement and needs to remain in bed)   Philip [name] 06/27/2024, 9:59 AM Lenoard SQUIBB, PT Acute Rehabilitation Services Office: [phone]

## 2024-06-27 NOTE — Progress Notes (Signed)
 Physical Therapy Treatment Patient Details Name: Philip [name] MRN: [id number] DOB: 11-Dec-1940 Today's Date: 06/27/2024   History of Present Illness 83 yo male adm 06/17/24 due to wandering in  parking lot with fever and Afib. Pt recently had ear sx with graft from the neck and bacteremia. 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 PMH: Afib, CAD s/p CABG, PAD, OSA, AAA stenting, penile prothesis, HTN, HLD, skin CA    PT Comments  Pt pleasant with improved hip ROM bil and able to demonstrate 3/5 Rt hip flexion and 3+/5 on left with pt stating hip weakness as greatest limiter to gait speed and function with improved stride and speed with use of RW. Pt educated for repeated sit to stands and marching as well as education for cognitive deficits and balance challenges. HHPT remains appropriate.     If plan is discharge home, recommend the following: Supervision due to cognitive status;Help with stairs or ramp for entrance;Assist for transportation;Assistance with cooking/housework   Can travel by private vehicle        Equipment Recommendations  None recommended by PT    Recommendations for Other Services       Precautions / Restrictions Precautions Precautions: Fall Recall of Precautions/Restrictions: Intact     Mobility  Bed Mobility Overal bed mobility: Modified Independent             General bed mobility comments: bed flat without rail    Transfers Overall transfer level: Modified independent                 General transfer comment: pt able to stand from bed and chair without assist. pt completed 10 repeated sit to stands <1 min without use of UB for strengthening    Ambulation/Gait Ambulation/Gait assistance: Supervision Gait Distance (Feet): 500 Feet Assistive device: Rolling walker (2 wheels), None Gait Pattern/deviations: Step-through pattern, Decreased stride length   Gait velocity  interpretation: 1.31 - 2.62 ft/sec, indicative of limited community ambulator   General Gait Details: pt walked 100' without RW with decreased stride and steady, unable to rotate head while walking due to limited neck ROM and limited change in gait speed. RW for remainder of gait with increased stride and speed   Stairs             Wheelchair Mobility     Tilt Bed    Modified Rankin (Stroke Patients Only) Modified Rankin (Stroke Patients Only) Pre-Morbid Rankin Score: No symptoms Modified Rankin: Moderately severe disability     Balance Overall balance assessment: Needs assistance   Sitting balance-Leahy Scale: Good       Standing balance-Leahy Scale: Good       Tandem Stance - Right Leg: 20 Tandem Stance - Left Leg: 20   Rhomberg - Eyes Closed: 30   High Level Balance Comments: pt able to turn 360 bil in 4 sec, pt can tandem stance with CGA to place feet then hold 20 sec, unable to rotate neck sufficiently with gait to perform head turns, limited ability to increase gait speed            Communication Communication Communication: No apparent difficulties  Cognition Arousal: Alert Behavior During Therapy: Flat affect   PT - Cognitive impairments: Awareness, Memory, Problem solving                       PT - Cognition Comments: pt unable to recall room number  or use directional sign to find way to room despite education x 3 during ambulation Following commands: Intact Following commands impaired: Only follows one step commands consistently    Cueing Cueing Techniques: Verbal cues  Exercises      General Comments        Pertinent Vitals/Pain Pain Assessment Pain Assessment: No/denies pain    Home Living                          Prior Function            PT Goals (current goals can now be found in the care plan section) Progress towards PT goals: Progressing toward goals    Frequency    Min 2X/week      PT Plan       Co-evaluation              AM-PAC PT 6 Clicks Mobility   Outcome Measure  Help needed turning from your back to your side while in a flat bed without using bedrails?: None Help needed moving from lying on your back to sitting on the side of a flat bed without using bedrails?: None Help needed moving to and from a bed to a chair (including a wheelchair)?: None Help needed standing up from a chair using your arms (e.g., wheelchair or bedside chair)?: A Little Help needed to walk in hospital room?: A Little Help needed climbing 3-5 steps with a railing? : A Little 6 Click Score: 21    End of Session Equipment Utilized During Treatment: Gait belt Activity Tolerance: Patient tolerated treatment well Patient left: in chair;with nursing/sitter in room Nurse Communication: Mobility status PT Visit Diagnosis: Muscle weakness (generalized) (M62.81);Difficulty in walking, not elsewhere classified (R26.2);Other abnormalities of gait and mobility (R26.89)     Time: 8890-8875 PT Time Calculation (min) (ACUTE ONLY): 15 min  Charges:    $Gait Training: 8-22 mins PT General Charges $$ ACUTE PT VISIT: 1 Visit                     Philip [name], PT Acute Rehabilitation Services Office: [phone]    Philip [name] Philip [name] 06/27/2024, 12:45 PM

## 2024-06-27 NOTE — Progress Notes (Addendum)
 Occupational Therapy Treatment Patient Details Name: Philip [name] MRN: [id number] DOB: 10-Feb-1941 Today's Date: 06/27/2024   History of present illness 83 yo male adm 06/17/24 due to wandering in  parking lot with fever and Afib. Pt recently had ear sx with graft from the neck and bacteremia. 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 PMH: Afib, CAD s/p CABG, PAD, OSA, AAA stenting, penile prothesis, HTN, HLD, skin CA   OT comments  Patient continues to make good gains with OT treatment. Patient able to get to EOB without assistance and CGA for transfer to recliner. Patient performed grooming before eating breakfast seated in recliner. Patient met self feeding goals with patient able to feed self with setup and patient able to open most containers.  Discharge recommendations changed to [facility] due to functional gains. Acute OT to continue to follow to address establish goals. Addendum performed to update discharge recommendations.       If plan is discharge home, recommend the following:  A little help with walking and/or transfers;A lot of help with bathing/dressing/bathroom   Equipment Recommendations  BSC/3in1;Other (comment)    Recommendations for Other Services      Precautions / Restrictions Precautions Precautions: Fall Restrictions Weight Bearing Restrictions Per Provider Order: No       Mobility Bed Mobility Overal bed mobility: Modified Independent             General bed mobility comments: verbal cues to initiate    Transfers Overall transfer level: Needs assistance Equipment used: Rolling walker (2 wheels) Transfers: Sit to/from Stand, Bed to chair/wheelchair/BSC Sit to Stand: Supervision     Step pivot transfers: Contact guard assist     General transfer comment: cues for hand placement and safety     Balance Overall balance assessment: Mild deficits observed, not formally tested Sitting-balance  support: No upper extremity supported, Feet supported Sitting balance-Leahy Scale: Good     Standing balance support: During functional activity, Reliant on assistive device for balance, Single extremity supported, Bilateral upper extremity supported Standing balance-Leahy Scale: Fair Standing balance comment: stood for transfer                           ADL either performed or assessed with clinical judgement   ADL Overall ADL's : Needs assistance/impaired Eating/Feeding: Set up;Sitting Eating/Feeding Details (indicate cue type and reason): patient able to open containers and manage all utensils Grooming: Wash/dry hands;Wash/dry face;Brushing hair;Set up;Sitting                                 General ADL Comments: could benefit from grooming standing at sink    Extremity/Trunk Assessment              Vision       Perception     Praxis     Communication Communication Communication: No apparent difficulties   Cognition Arousal: Alert Behavior During Therapy: Flat affect Cognition: Cognition impaired   Orientation impairments: Time Awareness: Intellectual awareness intact, Online awareness impaired Memory impairment (select all impairments): Short-term memory Attention impairment (select first level of impairment): Alternating attention   OT - Cognition Comments: unable to give correct day but knew month and year                 Following commands: Intact Following commands impaired: Follows one step commands  inconsistently, Follows one step commands with increased time      Cueing   Cueing Techniques: Verbal cues  Exercises      Shoulder Instructions       General Comments      Pertinent Vitals/ Pain       Pain Assessment Pain Assessment: No/denies pain Pain Intervention(s): Monitored during session  Home Living                                          Prior Functioning/Environment               Frequency  Min 2X/week        Progress Toward Goals  OT Goals(current goals can now be found in the care plan section)  Progress towards OT goals: Progressing toward goals  Acute Rehab OT Goals Patient Stated Goal: to get better OT Goal Formulation: With patient Time For Goal Achievement: 07/03/24 Potential to Achieve Goals: Good ADL Goals Pt Will Perform Eating: with set-up;sitting Pt Will Perform Grooming: with set-up;sitting Pt Will Perform Upper Body Bathing: with contact guard assist;sitting Pt Will Transfer to Toilet: with min assist;stand pivot transfer;bedside commode Additional ADL Goal #1: pt will demonstrate bed mobility supervision as precursor to adls.  Plan      Co-evaluation                 AM-PAC OT 6 Clicks Daily Activity     Outcome Measure   Help from another person eating meals?: A Little Help from another person taking care of personal grooming?: A Little Help from another person toileting, which includes using toliet, bedpan, or urinal?: A Lot Help from another person bathing (including washing, rinsing, drying)?: A Lot Help from another person to put on and taking off regular upper body clothing?: A Little Help from another person to put on and taking off regular lower body clothing?: A Lot 6 Click Score: 15    End of Session Equipment Utilized During Treatment: Gait belt;Rolling walker (2 wheels)  OT Visit Diagnosis: Unsteadiness on feet (R26.81);Muscle weakness (generalized) (M62.81)   Activity Tolerance Patient tolerated treatment well   Patient Left in chair;with call bell/phone within reach;with chair alarm set   Nurse Communication Mobility status;Precautions        Time: 9254-9189 OT Time Calculation (min): 25 min  Charges: OT General Charges $OT Visit: 1 Visit OT Treatments $Self Care/Home Management : 23-37 mins  Dick [name], OTA Acute Rehabilitation Services  Office [phone]   Philip [name] 06/27/2024,  9:33 AM

## 2024-06-27 NOTE — TOC Transition Note (Signed)
 Transition of Care [facility]) - Discharge Note   Patient Details  Name: Philip [name] MRN: [id number] Date of Birth: 1941-05-04  Transition of Care [facility]) CM/SW Contact:  Waddell Barnie Rama, RN Phone Number: 06/27/2024, 2:31 PM   Clinical Narrative:    For dc today, Holley Herring will come to do education with patient and wife today, Hedda will be providing the [facility] services.  Patient has transportation at dc.          Patient Goals and CMS Choice            Discharge Placement                       Discharge Plan and Services Additional resources added to the After Visit Summary for                                       Social Drivers of Health (SDOH) Interventions SDOH Screenings   Food Insecurity: No Food Insecurity (06/19/2024)  Housing: Low Risk  (06/19/2024)  Transportation Needs: No Transportation Needs (06/19/2024)  Utilities: Not At Risk (06/19/2024)  Alcohol Screen: Low Risk  (05/15/2024)  Depression (PHQ2-9): Low Risk  (03/27/2024)  Financial Resource Strain: Low Risk  (05/15/2024)  Physical Activity: Sufficiently Active (05/15/2024)  Social Connections: Moderately Integrated (06/20/2024)  Stress: No Stress Concern Present (05/15/2024)  Tobacco Use: Medium Risk (06/17/2024)     Readmission Risk Interventions    06/18/2024    3:45 PM  Readmission Risk Prevention Plan  Transportation Screening Complete  Home Care Screening Complete  Medication Review (RN CM) Complete

## 2024-06-27 NOTE — Care Management Important Message (Signed)
 Important Message  Patient Details  Name: Philip [name] MRN: [id number] Date of Birth: April 24, 1941   Important Message Given:  Yes - Medicare IM     Vonzell Arrie Sharps 06/27/2024, 11:48 AM

## 2024-06-27 NOTE — Plan of Care (Signed)
   Problem: Education: Goal: Knowledge of General Education information will improve Description: Including pain rating scale, medication(s)/side effects and non-pharmacologic comfort measures Outcome: Progressing   Problem: Clinical Measurements: Goal: Ability to maintain clinical measurements within normal limits will improve Outcome: Progressing   Problem: Clinical Measurements: Goal: Respiratory complications will improve Outcome: Progressing

## 2024-06-28 ENCOUNTER — Telehealth: Payer: Self-pay

## 2024-06-28 DIAGNOSIS — A4101 Sepsis due to Methicillin susceptible Staphylococcus aureus: Secondary | ICD-10-CM | POA: Diagnosis not present

## 2024-06-28 DIAGNOSIS — C44209 Unspecified malignant neoplasm of skin of left ear and external auricular canal: Secondary | ICD-10-CM | POA: Diagnosis not present

## 2024-06-28 DIAGNOSIS — T8131XD Disruption of external operation (surgical) wound, not elsewhere classified, subsequent encounter: Secondary | ICD-10-CM | POA: Diagnosis not present

## 2024-06-28 DIAGNOSIS — G9341 Metabolic encephalopathy: Secondary | ICD-10-CM | POA: Diagnosis not present

## 2024-06-28 DIAGNOSIS — T8149XA Infection following a procedure, other surgical site, initial encounter: Secondary | ICD-10-CM | POA: Diagnosis not present

## 2024-06-28 DIAGNOSIS — I509 Heart failure, unspecified: Secondary | ICD-10-CM | POA: Diagnosis not present

## 2024-06-28 DIAGNOSIS — I330 Acute and subacute infective endocarditis: Secondary | ICD-10-CM | POA: Diagnosis not present

## 2024-06-28 DIAGNOSIS — I76 Septic arterial embolism: Secondary | ICD-10-CM | POA: Diagnosis not present

## 2024-06-28 DIAGNOSIS — I130 Hypertensive heart and chronic kidney disease with heart failure and stage 1 through stage 4 chronic kidney disease, or unspecified chronic kidney disease: Secondary | ICD-10-CM | POA: Diagnosis not present

## 2024-06-28 NOTE — Transitions of Care (Post Inpatient/ED Visit) (Signed)
 06/28/2024  Name: Philip [name] MRN: [id number] DOB: August 14, 1941  Today's TOC FU Call Status: Today's TOC FU Call Status:: Successful TOC FU Call Completed TOC FU Call Complete Date: 06/28/24 Patient's Name and Date of Birth confirmed.  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 How have you been since you were released from the hospital?: Better Any questions or concerns?: No  Items Reviewed: Did you receive and understand the discharge instructions provided?: Yes Medications obtained,verified, and reconciled?: Yes (Medications Reviewed) Any new allergies since your discharge?: No Dietary orders reviewed?: Yes Type of Diet Ordered:: low sodium heart healthy Do you have support at home?: Yes People in Home [RPT]: spouse Name of Support/Comfort Primary Source: Rudolf Blizard ( wife )  Medications Reviewed Today: Medications Reviewed Today     Reviewed by Lauro Shona LABOR, RN (Registered Nurse) on 06/28/24 at 1209  Med List Status: <None>   Medication Order Taking? Sig Documenting Provider Last Dose Status Informant  amLODipine  (NORVASC ) 10 MG tablet [id number] Yes Take 1 tablet (10 mg total) by mouth at bedtime. Court Dorn PARAS, MD  Active Pharmacy Records, Spouse/Significant Other  Ascorbic Acid (VITAMIN C ER PO) [id number] Yes Take 500 mg by mouth daily. [provider]  Active Pharmacy Records, Spouse/Significant Other  atorvastatin  (LIPITOR) 40 MG tablet [id number] Yes Take 1 tablet (40 mg total) by mouth daily. Court Dorn PARAS, MD  Active Pharmacy Records, Spouse/Significant Other  B Complex-Biotin-FA (B-COMPLEX PO) [id number] Yes Take 1 tablet by mouth daily. [provider]  Active Pharmacy Records, Spouse/Significant Other  ceFAZolin   (ANCEF ) IVPB [id number] Yes Inject 6-8 g into the vein daily. Indication:  MSSA endocarditis First Dose: Yes Last Day of Therapy:  8g CI thru 9/9, then 6g CI thru 10/1 Labs - Once weekly:  CBC/D and BMP, Labs - Once weekly: ESR and CRP Method of administration: continuous infusion - 8g daily as continuous infusion thru 9/9 then 6g daily as a continuous infusion thru 10/1 Method of administration may be changed at the discretion of home infusion pharmacist based upon assessment of the patient and/or caregiver's ability to self-administer the medication ordered. Overton Faith T, MD  Active   co-enzyme Q-10 30 MG capsule [id number] Yes Take 30 mg by mouth daily. [provider]  Active Pharmacy Records, Spouse/Significant Other  ELIQUIS  5 MG TABS tablet [id number] Yes TAKE 1 TABLET TWICE DAILY Wyn Jackee VEAR Raddle., NP  Active Pharmacy Records, Spouse/Significant Other  hydrocortisone 2.5 % cream [id number]  Apply 1 application topically as needed (skin irritation).  Patient not taking: Reported on 06/28/2024   [provider]  Active Pharmacy Records, Spouse/Significant Other           Med Note (BRIDGES, JACQUELINE L   Mon Apr 17, 2023  9:19 AM)    magnesium  oxide (MAG-OX) 400 MG tablet [id number] Yes Take 800 mg by mouth daily. [provider]  Active Pharmacy Records, Spouse/Significant Other  metoprolol  tartrate (LOPRESSOR ) 50 MG tablet [id number] Yes Take 1 tablet (50 mg total) by mouth 2 (two) times daily. Drusilla Sabas RAMAN, MD  Active   Multiple Vitamins-Minerals (MULTIVITAMIN WITH MINERALS) tablet [id number] Yes Take 1 tablet by mouth daily. [provider]  Active Pharmacy Records, Spouse/Significant Other  Omega-3 Fatty Acids (OMEGA-3 FISH OIL ) 1200 MG CAPS [id number] Yes Take 2 capsules (2,400 mg  total) by mouth daily. Take 2 1,200 mg capsules daily Eckard, Tammy, RPH-CPP  Active Pharmacy Records, Spouse/Significant Other  polyethylene glycol (MIRALAX  / GLYCOLAX ) packet  [id number] Yes Take 17 g by mouth daily. [provider]  Active Pharmacy Records, Spouse/Significant Other            Home Care and Equipment/Supplies: Were Home Health Services Ordered?: Yes Name of Home Health Agency:: [facility] Has Agency set up a time to come to your home?: Yes First Home Health Visit Date: 06/28/24 Any new equipment or medical supplies ordered?: Yes Name of Medical supply agency?: [facility] provided IV antibiotic Were you able to get the equipment/medical supplies?: Yes Do you have any questions related to the use of the equipment/supplies?: No  Functional Questionnaire: Do you need assistance with bathing/showering or dressing?: No Do you need assistance with meal preparation?: No (wife helps with preparing meals) Do you need assistance with eating?: No Do you have difficulty maintaining continence: No Do you need assistance with getting out of bed/getting out of a chair/moving?: Yes (patient states he has a walker and uses as needed) Do you have difficulty managing or taking your medications?: Yes (wife helps with medications)  Follow up appointments reviewed: PCP Follow-up appointment confirmed?: Yes (conference call with patient to PCP office to schedule appt) Date of PCP follow-up appointment?: 07/02/24 Follow-up Provider: PCP office with NP Specialist Hospital Follow-up appointment confirmed?: Yes Date of Specialist follow-up appointment?: 07/09/24 Follow-Up Specialty Provider:: Philemon Cordella BIRCH, FNP (Infectious Diseases) Do you need transportation to your follow-up appointment?: No Do you understand care options if your condition(s) worsen?: Yes-patient verbalized understanding  SDOH Interventions Today    Flowsheet Row Most Recent Value  SDOH Interventions   Food Insecurity Interventions Intervention Not Indicated  Housing Interventions Intervention Not Indicated  Transportation Interventions Intervention Not  Indicated  Utilities Interventions Intervention Not Indicated    Goals Addressed             This Visit's Progress    VBCI Transitions of Care (TOC) Care Plan       Problems:  Recent Hospitalization for treatment of 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    Medication management barrier Wife reports they did not receive the New Metoprolol   Goal:  Over the next 30 days, the patient will not experience hospital readmission  Interventions:  Transitions of Care: Doctor Visits  - discussed the importance of doctor visits Contacted provider for patient needs regarding Metoprolol - wife will pick up from pharmacy Post-op wound/incision care reviewed with patient/caregiver Reviewed Signs and symptoms of infection Reviewed wound care and IV antibiotic adminstration per d/c summary Advised wife that [facility] is [facility] as both are listed on d/c summary for issues with IV medication - patient has PICC line right arm Educated to [employer] with any issues/concerns who can make a visit prior to their next scheduled visit - advised TOC RN will not be in office Monday due to holiday  Patient Self Care Activities:  Attend all scheduled provider appointments Call pharmacy for medication refills 3-7 days in advance of running out of medications Call provider office for new concerns or questions  Notify RN Care Manager of [facility] call rescheduling needs Participate in Transition of Care Program/Attend TOC scheduled calls Take medications as prescribed    Plan:  Telephone follow up appointment with care management team member scheduled for:  07/05/24 11am The patient has been provided  with contact information for the care management team and has been advised to call with any health related questions or concerns.        Any assessment questions not assessed was due to length of time on call - wife took over call for patient who was tired     Shona Lauro PEAK, CCM [facility]  VBCI-Population Health RN Care Manager [phone]

## 2024-06-28 NOTE — Patient Instructions (Signed)
 Visit Information  Thank you for taking time to visit with me today. Please don't hesitate to contact me if I can be of assistance to you before our next scheduled telephone appointment.  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    Medication management barrier Wife reports they did not receive the New Metoprolol   Goal:  Over the next 30 days, the patient will not experience hospital readmission  Interventions:  Transitions of Care: Doctor Visits  - discussed the importance of doctor visits Contacted provider for patient needs regarding Metoprolol - wife will pick up from pharmacy Post-op wound/incision care reviewed with patient/caregiver Reviewed Signs and symptoms of infection Reviewed wound care and IV antibiotic adminstration per d/c summary Advised wife that [facility] as both are listed on d/c summary for issues with IV medication - patient has PICC line right arm Educated to [employer] with any issues/concerns who can make a visit prior to their next scheduled visit - advised TOC RN will not be in office Monday due to holiday  Patient Self Care Activities:  Attend all scheduled provider appointments Call pharmacy for medication refills 3-7 days in advance of running out of medications Call provider office for new concerns or questions  Notify RN Care Manager of [facility] call rescheduling needs Participate in Transition of Care Program/Attend TOC scheduled calls Take medications as prescribed    Plan:  Telephone follow up appointment with care management team member scheduled for:  07/05/24 11am The patient has been provided with contact information for the care  management team and has been advised to call with any health related questions or concerns.         Patient verbalizes understanding of instructions and care plan provided today and agrees to view in MyChart. Active MyChart status and patient understanding of how to access instructions and care plan via MyChart confirmed with patient.     Telephone follow up appointment with care management team member scheduled for: 07/05/24 11am The patient has been provided with contact information for the care management team and has been advised to call with any health related questions or concerns.   Please call the care guide team at [phone] if you need to cancel or reschedule your appointment.   Please call the Suicide and Crisis Lifeline: 988 call 1-800-273-TALK (toll free, 24 hour hotline) call 911 if you are experiencing a Mental Health or Behavioral Health Crisis or need someone to talk to.  Shona Prow RN, CCM [place]  VBCI-Population Health RN Care Manager [phone]

## 2024-07-02 ENCOUNTER — Telehealth: Payer: Self-pay

## 2024-07-02 ENCOUNTER — Ambulatory Visit (INDEPENDENT_AMBULATORY_CARE_PROVIDER_SITE_OTHER): Admitting: Family

## 2024-07-02 ENCOUNTER — Encounter: Payer: Self-pay | Admitting: Family

## 2024-07-02 VITALS — BP 138/76 | HR 60 | Temp 98.1°F | Ht 69.0 in | Wt 222.6 lb

## 2024-07-02 DIAGNOSIS — I130 Hypertensive heart and chronic kidney disease with heart failure and stage 1 through stage 4 chronic kidney disease, or unspecified chronic kidney disease: Secondary | ICD-10-CM | POA: Diagnosis not present

## 2024-07-02 DIAGNOSIS — I330 Acute and subacute infective endocarditis: Secondary | ICD-10-CM | POA: Diagnosis not present

## 2024-07-02 DIAGNOSIS — B9561 Methicillin susceptible Staphylococcus aureus infection as the cause of diseases classified elsewhere: Secondary | ICD-10-CM

## 2024-07-02 DIAGNOSIS — R7881 Bacteremia: Secondary | ICD-10-CM | POA: Diagnosis not present

## 2024-07-02 DIAGNOSIS — T8131XD Disruption of external operation (surgical) wound, not elsewhere classified, subsequent encounter: Secondary | ICD-10-CM | POA: Diagnosis not present

## 2024-07-02 DIAGNOSIS — I76 Septic arterial embolism: Secondary | ICD-10-CM | POA: Diagnosis not present

## 2024-07-02 DIAGNOSIS — A419 Sepsis, unspecified organism: Secondary | ICD-10-CM

## 2024-07-02 DIAGNOSIS — G9341 Metabolic encephalopathy: Secondary | ICD-10-CM | POA: Diagnosis not present

## 2024-07-02 DIAGNOSIS — I509 Heart failure, unspecified: Secondary | ICD-10-CM | POA: Diagnosis not present

## 2024-07-02 DIAGNOSIS — A4101 Sepsis due to Methicillin susceptible Staphylococcus aureus: Secondary | ICD-10-CM | POA: Diagnosis not present

## 2024-07-02 DIAGNOSIS — C44209 Unspecified malignant neoplasm of skin of left ear and external auricular canal: Secondary | ICD-10-CM | POA: Diagnosis not present

## 2024-07-02 DIAGNOSIS — T8149XA Infection following a procedure, other surgical site, initial encounter: Secondary | ICD-10-CM | POA: Diagnosis not present

## 2024-07-02 NOTE — Progress Notes (Signed)
 Philip [name] is a 83 y.o. male with the following history as recorded in EpicCare:  Patient Active Problem List   Diagnosis Date Noted   Surgical site infection 06/19/2024   Sepsis (HCC) 06/18/2024   MSSA bacteremia 06/18/2024   Fever 06/17/2024   Altered mental status 06/17/2024   Cough 12/05/2022   Postural dizziness with presyncope 10/06/2021   Elevated TSH 10/06/2021   CAD (coronary artery disease) 10/06/2021   AAA (abdominal aortic aneurysm) (HCC) 10/06/2021   Renal mass 10/06/2021   Elevated troponin 10/06/2021   NSTEMI (non-ST elevated myocardial infarction) (HCC) 10/06/2021   Skin cancer of face 08/05/2020   Depression 04/02/2019   Lower extremity edema 12/22/2017   Coronary artery disease involving native coronary artery of native heart without angina pectoris 12/22/2017   Other specified hypothyroidism 10/02/2017   Osteoarthritis of left hip 06/26/2017   Obesity (BMI 30.0-34.9) 05/08/2017   Hyperglycemia 04/19/2017   Vitamin D  deficiency 03/29/2017   Idiopathic peripheral neuropathy 03/13/2017   Erectile dysfunction 02/22/2017   PCP NOTES >>>>>>>>>>>>>>>>>>>>>>>>>>>>>>>>>>>>>>> 12/21/2015   Chronic constipation 12/21/2015   Atrial fibrillation (HCC) 05/07/2014   Encounter for pre-operative cardiovascular clearance 05/07/2014   AAA (abdominal aortic aneurysm) without rupture (HCC) 04/30/2014   Fatigue 11/25/2013   Hypomagnesemia 06/29/2011   Annual physical exam 02/16/2011   Insomnia 02/16/2011   OSA (obstructive sleep apnea)    History of skin cancer 07/24/2008   Coronary atherosclerosis 07/04/2008   Mixed hyperlipidemia 02/08/2007   Essential hypertension 02/08/2007    Current Outpatient Medications  Medication Sig Dispense Refill   amLODipine  (NORVASC ) 10 MG tablet Take 1 tablet (10 mg total) by mouth at bedtime. 90 tablet 4   Ascorbic Acid (VITAMIN C ER PO) Take 500 mg by mouth daily.     atorvastatin  (LIPITOR) 40 MG tablet Take 1 tablet (40 mg total) by  mouth daily. 90 tablet 4   B Complex-Biotin-FA (B-COMPLEX PO) Take 1 tablet by mouth daily.     ceFAZolin  (ANCEF ) IVPB Inject 6-8 g into the vein daily. Indication:  MSSA endocarditis First Dose: Yes Last Day of Therapy:  8g CI thru 9/9, then 6g CI thru 10/1 Labs - Once weekly:  CBC/D and BMP, Labs - Once weekly: ESR and CRP Method of administration: continuous infusion - 8g daily as continuous infusion thru 9/9 then 6g daily as a continuous infusion thru 10/1 Method of administration may be changed at the discretion of home infusion pharmacist based upon assessment of the patient and/or caregiver's ability to self-administer the medication ordered. 35 Units 0   co-enzyme Q-10 30 MG capsule Take 30 mg by mouth daily.     ELIQUIS  5 MG TABS tablet TAKE 1 TABLET TWICE DAILY 180 tablet 3   hydrocortisone 2.5 % cream Apply 1 application topically as needed (skin irritation).     magnesium  oxide (MAG-OX) 400 MG tablet Take 800 mg by mouth daily.     metoprolol  tartrate (LOPRESSOR ) 50 MG tablet Take 1 tablet (50 mg total) by mouth 2 (two) times daily. 60 tablet 2   Multiple Vitamins-Minerals (MULTIVITAMIN WITH MINERALS) tablet Take 1 tablet by mouth daily.     Omega-3 Fatty Acids (OMEGA-3 FISH OIL ) 1200 MG CAPS Take 2 capsules (2,400 mg total) by mouth daily. Take 2 1,200 mg capsules daily 90 capsule 3   polyethylene glycol (MIRALAX  / GLYCOLAX ) packet Take 17 g by mouth daily.     No current facility-administered medications for this visit.    Allergies: Patient has no  known allergies.  Past Medical History:  Diagnosis Date   AAA (abdominal aortic aneurysm) (HCC)    Arthritis    Atrial fibrillation (HCC)    CAD (coronary artery disease)    had a MI , s/p CABG   Cataracts, bilateral    immature   CHF (congestive heart failure) (HCC)    Dysrhythmia    paroximal a fib   GERD (gastroesophageal reflux disease)    takes Omeprazole  daily   History of colon polyps    History of kidney stones     History of shingles    Hyperlipidemia    Hypertension    takes Amlodipine ,Metoprolol ,and Lisinopril  daily   Joint pain    Joint swelling    Myocardial infarction (HCC)    several with last one being 2001(but never knew about any except 2001)   OSA (obstructive sleep apnea)    started CPAP 12/09   Peripheral vascular disease (HCC)    Peyronie's disease    s/p penile implant in 2006   Pneumonia    as a child   Skin cancer    several , one of them was melanoma (sees derm routinely)   Sleep apnea    Tingling    feet occasionally   Tinnitus     Past Surgical History:  Procedure Laterality Date   ABDOMINAL AORTAGRAM N/A 06/16/2014   Procedure: ABDOMINAL EZELLA;  Surgeon: Lonni GORMAN Blade, MD;  Location: [facility] CATH LAB;  Service: Cardiovascular;  Laterality: N/A;   ABDOMINAL AORTIC ENDOVASCULAR STENT GRAFT N/A 07/08/2014   Procedure: ABDOMINAL AORTIC ENDOVASCULAR STENT GRAFT/ GORE;  Surgeon: Lonni GORMAN Blade, MD;  Location: [facility] OR;  Service: Vascular;  Laterality: N/A;   CARDIAC CATHETERIZATION  10/31/2013   COLONOSCOPY     CORONARY ARTERY BYPASS GRAFT  10/31/1997   x 3 vessels   LEFT HEART CATH AND CORS/GRAFTS ANGIOGRAPHY N/A 10/07/2021   Procedure: LEFT HEART CATH AND CORS/GRAFTS ANGIOGRAPHY;  Surgeon: Verlin Lonni BIRCH, MD;  Location: [facility] INVASIVE CV LAB;  Service: Cardiovascular;  Laterality: N/A;   LEFT HEART CATHETERIZATION WITH CORONARY/GRAFT ANGIOGRAM N/A 05/29/2014   Procedure: LEFT HEART CATHETERIZATION WITH EL BILE;  Surgeon: Ezra GORMAN Shuck, MD;  Location: [facility] CATH LAB;  Service: Cardiovascular;  Laterality: N/A;   PENILE PROSTHESIS IMPLANT  08/31/2005   AMS inflatable penile prosthesis   RHINOPLASTY  10/31/1973   TRANSESOPHAGEAL ECHOCARDIOGRAM (CATH LAB) N/A 06/24/2024   Procedure: TRANSESOPHAGEAL ECHOCARDIOGRAM;  Surgeon: Loni Soyla LABOR, MD;  Location: [facility] INVASIVE CV LAB;  Service: Cardiovascular;  Laterality: N/A;    Family History   Problem Relation Age of Onset   Prostate cancer Father 49   Heart disease Father    Skin cancer Father    Heart attack Father    Cancer Father    Deep vein thrombosis Father    Hyperlipidemia Father    Hypertension Father    Heart disease Mother    Skin cancer Mother    Heart attack Mother    Cancer Mother    Hyperlipidemia Mother    Hypertension Mother    Varicose Veins Mother    Peripheral vascular disease Mother        amputation   Sleep apnea Brother    Hyperlipidemia Brother    Hypertension Brother    Prostate cancer Brother 34   Colon cancer Other        aunt   Skin cancer Brother    Skin cancer Sister    Cancer Sister  Heart disease Sister    Diabetes Neg Hx    Stroke Neg Hx     Social History   Tobacco Use   Smoking status: Former    Current packs/day: 0.00    Types: Cigarettes    Quit date: 1979    Years since quitting: 46.7   Smokeless tobacco: Never   Tobacco comments:    quit smoking in 1979  Substance Use Topics   Alcohol use: Yes    Comment: occasionally - once per week    Subjective:   Patient was hospitalized from 06/17/24- 06/27/24 with 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; accompanied by wife today; is under care of ID- scheduled to see them next week; unfortunately, there was a scheduling error today and he should have been scheduled with his PCP;  Per patient, he is doing better but still feeling weak; home health will be starting today; does have PICC line in place and is getting IV antibiotics as ordered;   Discharge summary does indicate need to follow up with dermatology but patient and wife both agree they want to wait on that at this time;   No fever, chest pain or shortness of breath; appetite is improving;   Objective:  Vitals:   07/02/24 0844 07/02/24 0912  BP: (!) 140/76 138/76  Pulse: 60   Temp: 98.1 F (36.7 C)   TempSrc: Oral   SpO2: 94%   Weight: 222 lb 9.6 oz (101 kg)   Height: 5' 9 (1.753 m)      General: Well developed, well nourished, in no acute distress  Skin : Warm and dry.  Head: Normocephalic and atraumatic  Lungs: Respirations unlabored; clear to auscultation bilaterally without wheeze, rales, rhonchi  CVS exam: normal rate and regular rhythm.  Neurologic: Alert and oriented; speech intact; face symmetrical; uses walker;  Assessment:  1. Sepsis, due to unspecified organism, unspecified whether acute organ dysfunction present (HCC)   2. MSSA bacteremia     Plan:  Patient will keep planned follow up with ID as scheduled for next week; home health is starting today; patient will plan to see his PCP in 3 weeks for follow up.   Return in about 3 weeks (around 07/23/2024) for follow up.  No orders of the defined types were placed in this encounter.   Requested Prescriptions    No prescriptions requested or ordered in this encounter

## 2024-07-02 NOTE — Telephone Encounter (Signed)
 The patient had a recent acute cerebral infarction in the context of endocarditis. He was seen at the office today by another provider athe was feeling okay for the circumstances.  I stopped him on the hallway,  was using a walker, his speech was essentially normal. Plan: -Okay for PT. -Please ask patient to check BPs daily.  Call if more than 140/75 consistently. -If there is any mental status changes, acute difficulty with motor strength or speech: ER.

## 2024-07-02 NOTE — Telephone Encounter (Signed)
 Copied from CRM #[id number]. Topic: Clinical - Home Health Verbal Orders >> Jul 02, 2024  2:36 PM Sasha M wrote: Caller/Agency: Signe Beagle [facility] Callback Number: [phone] Service Requested: Physical Therapy Frequency: once a week for 8 weeks Any new concerns about the patient? Yes Mr. Rosan states that pt was in hospital and after discharge he completed a home evaluation. Mr. Athena concerns at this time are pt BP stating that after pt climbed stairs and sat down BP was reading 200/90, pt stood up and BP read 180/80, after a 10 min rest-seated BP was 160/70 and standing was 140/70. He had no symptoms during this period.  Another concern Mr. Hoffman expressed is it seems like the pt has aphasia, he states communication seems impaired and Mr. Rosan is not sure if it is expressive or reactive. Currently pt only has PT and nursing involved.

## 2024-07-02 NOTE — Patient Instructions (Signed)
 Please keep planned follow up with your ID provider and see dermatology. You should also plan to see Dr. Amon in follow up in about 3 weeks.

## 2024-07-03 ENCOUNTER — Other Ambulatory Visit: Payer: Self-pay

## 2024-07-03 DIAGNOSIS — I714 Abdominal aortic aneurysm, without rupture, unspecified: Secondary | ICD-10-CM

## 2024-07-03 NOTE — Telephone Encounter (Signed)
 Spoke w/ Signe- verbal orders and PCP recommendations given. Signe verbalized understanding.

## 2024-07-04 DIAGNOSIS — T8149XA Infection following a procedure, other surgical site, initial encounter: Secondary | ICD-10-CM | POA: Diagnosis not present

## 2024-07-04 DIAGNOSIS — R7881 Bacteremia: Secondary | ICD-10-CM | POA: Diagnosis not present

## 2024-07-04 DIAGNOSIS — B9561 Methicillin susceptible Staphylococcus aureus infection as the cause of diseases classified elsewhere: Secondary | ICD-10-CM | POA: Diagnosis not present

## 2024-07-05 ENCOUNTER — Other Ambulatory Visit: Payer: Self-pay

## 2024-07-05 NOTE — Patient Instructions (Signed)
 Visit Information  Thank you for taking time to visit with me today. Please don't hesitate to contact me if I can be of assistance to you before our next scheduled telephone appointment.  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    Medication management barrier Wife reports they did not receive the New Metoprolol -   Goal:  Over the next 30 days, the patient will not experience hospital readmission  Interventions:  Transitions of Care: Doctor Visits  - discussed the importance of doctor visits Contacted provider for patient needs regarding Metoprolol - wife will pick up from pharmacy Post-op wound/incision care reviewed with patient/caregiver Reviewed Signs and symptoms of infection Reviewed wound care and IV antibiotic adminstration per d/c summary Advised wife that [facility] is [facility] as both are listed on d/c summary for issues with IV medication - patient has PICC line right arm Educated to [employer] with any issues/concerns who can make a visit prior to their next scheduled visit - advised TOC RN will not be in office Monday due to holiday 07/05/24: Patient had appt 9/2 with Jason Leita Repine, FNP - [facility] - PICC in place, Home health visit 9/2 --Okay for PT.  -  Call if BP more than 140/75 consistently. - If there is any mental status changes, acute difficulty with motor strength or speech: ER  - Per my call - Patient reports some confusion today but no worse than when he left hospital - No BM x 2 days - education Miralax /warm prune juice - discussed Son who is POA helping with medication decisions related to confusion - wife states patient could not remember password on computer -  TOC encouraged wife to call PCP office and request Neuro referral as wife states patient had mild stroke in hospital and she would like him to see neuro.   Patient Self Care Activities:  Attend all scheduled provider appointments - 07/05/24 wife stated she was thinking of cancelling 07/09/24 appointment and [facility] RN educated this is with Infectious Disease and is important and wife agreed to keep appointment Call pharmacy for medication refills 3-7 days in advance of running out of medications Call provider office for new concerns or questions  Notify RN Care Manager of TOC call rescheduling needs Participate in Transition of Care Program/Attend TOC scheduled calls Take medications as prescribed    Plan:  Telephone follow up appointment with care management team member scheduled for:  07/12/24 11am The patient has been provided with contact information for the care management team and has been advised to call with any health related questions or concerns.         Patient verbalizes understanding of instructions and care plan provided today and agrees to view in MyChart. Active MyChart status and patient understanding of how to access instructions and care plan via MyChart confirmed with patient.     Telephone follow up appointment with care management team member scheduled for: 07/12/24 11am The patient has been provided with contact information for the care management team and has been advised to call with any health related questions or concerns.   Please call  the care guide team at [phone] if you need to cancel or reschedule your appointment.   Please call the Suicide and Crisis Lifeline: 988 call 1-800-273-TALK (toll free, 24 hour hotline) call 911 if you are experiencing a Mental Health or Behavioral Health Crisis or need someone to talk to.  Shona Prow RN, CCM [place]  VBCI-Population Health RN Care Manager [phone]

## 2024-07-05 NOTE — Transitions of Care (Post Inpatient/ED Visit) (Signed)
 Transition of Care week 2  Visit Note  07/05/2024  Name: Philip [name] MRN: [id number]          DOB: 23-May-1941  Situation: Patient enrolled in [facility] 30-day program. Visit completed with patient and wife by telephone.   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   - mild stroke reported by wife  Initial Transition Care Management Follow-up Telephone Call    Past Medical History:  Diagnosis Date   AAA (abdominal aortic aneurysm) (HCC)    Arthritis    Atrial fibrillation (HCC)    CAD (coronary artery disease)    had a MI , s/p CABG   Cataracts, bilateral    immature   CHF (congestive heart failure) (HCC)    Dysrhythmia    paroximal a fib   GERD (gastroesophageal reflux disease)    takes Omeprazole  daily   History of colon polyps    History of kidney stones    History of shingles    Hyperlipidemia    Hypertension    takes Amlodipine ,Metoprolol ,and Lisinopril  daily   Joint pain    Joint swelling    Myocardial infarction (HCC)    several with last one being 2001(but never knew about any except 2001)   OSA (obstructive sleep apnea)    started CPAP 12/09   Peripheral vascular disease (HCC)    Peyronie's disease    s/p penile implant in 2006   Pneumonia    as a child   Skin cancer    several , one of them was melanoma (sees derm routinely)   Sleep apnea    Tingling    feet occasionally   Tinnitus     Assessment: Patient Reported Symptoms: Cognitive Cognitive Status: No symptoms reported, Normal speech and language skills, Alert and oriented to person, place, and time, Struggling with memory recall      Neurological Neurological Review of Symptoms: Other:, Weakness [place] Neurological Symptoms/Conditions [RPT]: wife states patient had mild stroke during hospitalization - has difficulty with memory recall and physical weakness - discussed neurology referral -  Patient's PCP is out of office until 07/16/24 - educated to call PCP office to request neurology referral Neurological Management Strategies: Activity, Adequate rest Neurological Self-Management Outcome: 3 (uncertain) Neurological Comment: wife is calling PCP office to request Neuro referral as patient was seen by NP 07/02/24  HEENT HEENT Symptoms Reported: Other: (patient/wife report left ear is getting better) HEENT Self-Management Outcome: 4 (good)    Cardiovascular Cardiovascular Symptoms Reported: No symptoms reported    Respiratory Respiratory Symptoms Reported: No symptoms reported    Endocrine Endocrine Symptoms Reported: No symptoms reported    Gastrointestinal Gastrointestinal Symptoms Reported: Constipation Additional Gastrointestinal Details: patient reported not moving bowels x 2 days - discussed miralax  and warmed prune juice      Genitourinary Genitourinary Symptoms Reported: Other Other Genitourinary Symptoms: patient reports some difficulty starting flow and once started empties his bladder without difficulty - educated to discuss with MD as there is medication that may help Genitourinary Self-Management Outcome: 3 (uncertain)  Integumentary Integumentary Symptoms Reported: Wound Additional Integumentary Details: patient states ear and neck are looking better Skin Self-Management Outcome: 4 (good)  Musculoskeletal Musculoskelatal Symptoms Reviewed: Weakness Additional Musculoskeletal Details: patient reports weakness and is working with PT at home with [facility] and feels he can get around the house safely Musculoskeletal Self-Management Outcome: 3 (uncertain)      Psychosocial Psychosocial  Symptoms Reported: Depression - if selected complete PHQ 2-9         There were no vitals filed for this visit.  Medications Reviewed Today     Reviewed by Lauro Shona LABOR, RN (Registered Nurse) on 07/05/24 at 1124  Med List Status: <None>   Medication Order Taking? Sig  Documenting Provider Last Dose Status Informant  amLODipine  (NORVASC ) 10 MG tablet [id number] Yes Take 1 tablet (10 mg total) by mouth at bedtime. Court Dorn PARAS, MD  Active Pharmacy Records, Spouse/Significant Other  Ascorbic Acid (VITAMIN C ER PO) [id number] Yes Take 500 mg by mouth daily. [provider]  Active Pharmacy Records, Spouse/Significant Other  atorvastatin  (LIPITOR) 40 MG tablet [id number] Yes Take 1 tablet (40 mg total) by mouth daily. Court Dorn PARAS, MD  Active Pharmacy Records, Spouse/Significant Other  B Complex-Biotin-FA (B-COMPLEX PO) [id number] Yes Take 1 tablet by mouth daily. [provider]  Active Pharmacy Records, Spouse/Significant Other  ceFAZolin  (ANCEF ) IVPB [id number] Yes Inject 6-8 g into the vein daily. Indication:  MSSA endocarditis First Dose: Yes Last Day of Therapy:  8g CI thru 9/9, then 6g CI thru 10/1 Labs - Once weekly:  CBC/D and BMP, Labs - Once weekly: ESR and CRP Method of administration: continuous infusion - 8g daily as continuous infusion thru 9/9 then 6g daily as a continuous infusion thru 10/1 Method of administration may be changed at the discretion of home infusion pharmacist based upon assessment of the patient and/or caregiver's ability to self-administer the medication ordered. Overton Faith T, MD  Active   co-enzyme Q-10 30 MG capsule [id number] Yes Take 30 mg by mouth daily. [provider]  Active Pharmacy Records, Spouse/Significant Other  ELIQUIS  5 MG TABS tablet [id number] Yes TAKE 1 TABLET TWICE DAILY Wyn Jackee VEAR Raddle., NP  Active Pharmacy Records, Spouse/Significant Other  hydrocortisone 2.5 % cream [id number]  Apply 1 application topically as needed (skin irritation).  Patient not taking: Reported on 07/05/2024   [provider]  Active Pharmacy Records, Spouse/Significant Other           Med Note (BRIDGES, JACQUELINE L   Mon Apr 17, 2023  9:19 AM)    magnesium  oxide (MAG-OX) 400 MG tablet [id number] Yes  Take 800 mg by mouth daily. [provider]  Active Pharmacy Records, Spouse/Significant Other  metoprolol  tartrate (LOPRESSOR ) 50 MG tablet [id number] Yes Take 1 tablet (50 mg total) by mouth 2 (two) times daily. Drusilla Sabas RAMAN, MD  Active   Multiple Vitamins-Minerals (MULTIVITAMIN WITH MINERALS) tablet [id number] Yes Take 1 tablet by mouth daily. [provider]  Active Pharmacy Records, Spouse/Significant Other  Omega-3 Fatty Acids (OMEGA-3 FISH OIL ) 1200 MG CAPS [id number] Yes Take 2 capsules (2,400 mg total) by mouth daily. Take 2 1,200 mg capsules daily Eckard, Tammy, RPH-CPP  Active Pharmacy Records, Spouse/Significant Other  polyethylene glycol (MIRALAX  / GLYCOLAX ) packet [id number] Yes Take 17 g by mouth daily. [provider]  Active Pharmacy Records, Spouse/Significant Other            Recommendation:   Specialty provider follow-up wife to call PCP to request neuro consult  Follow Up Plan:   Telephone follow up appointment date/time:  07/12/24 11am   Shona Lauro RN, CCM [place]  VBCI-Population Health RN Care Manager [phone]

## 2024-07-08 DIAGNOSIS — I330 Acute and subacute infective endocarditis: Secondary | ICD-10-CM | POA: Diagnosis not present

## 2024-07-08 DIAGNOSIS — I509 Heart failure, unspecified: Secondary | ICD-10-CM | POA: Diagnosis not present

## 2024-07-08 DIAGNOSIS — I76 Septic arterial embolism: Secondary | ICD-10-CM | POA: Diagnosis not present

## 2024-07-08 DIAGNOSIS — I130 Hypertensive heart and chronic kidney disease with heart failure and stage 1 through stage 4 chronic kidney disease, or unspecified chronic kidney disease: Secondary | ICD-10-CM | POA: Diagnosis not present

## 2024-07-08 DIAGNOSIS — C44209 Unspecified malignant neoplasm of skin of left ear and external auricular canal: Secondary | ICD-10-CM | POA: Diagnosis not present

## 2024-07-08 DIAGNOSIS — T8149XA Infection following a procedure, other surgical site, initial encounter: Secondary | ICD-10-CM | POA: Diagnosis not present

## 2024-07-08 DIAGNOSIS — T8131XD Disruption of external operation (surgical) wound, not elsewhere classified, subsequent encounter: Secondary | ICD-10-CM | POA: Diagnosis not present

## 2024-07-08 DIAGNOSIS — A4101 Sepsis due to Methicillin susceptible Staphylococcus aureus: Secondary | ICD-10-CM | POA: Diagnosis not present

## 2024-07-08 DIAGNOSIS — G9341 Metabolic encephalopathy: Secondary | ICD-10-CM | POA: Diagnosis not present

## 2024-07-09 ENCOUNTER — Other Ambulatory Visit: Payer: Self-pay

## 2024-07-09 ENCOUNTER — Encounter: Payer: Self-pay | Admitting: Family

## 2024-07-09 ENCOUNTER — Ambulatory Visit (INDEPENDENT_AMBULATORY_CARE_PROVIDER_SITE_OTHER): Admitting: Family

## 2024-07-09 VITALS — BP 160/64 | HR 57 | Temp 97.4°F | Wt 228.0 lb

## 2024-07-09 DIAGNOSIS — I130 Hypertensive heart and chronic kidney disease with heart failure and stage 1 through stage 4 chronic kidney disease, or unspecified chronic kidney disease: Secondary | ICD-10-CM | POA: Diagnosis not present

## 2024-07-09 DIAGNOSIS — Z452 Encounter for adjustment and management of vascular access device: Secondary | ICD-10-CM | POA: Diagnosis not present

## 2024-07-09 DIAGNOSIS — I76 Septic arterial embolism: Secondary | ICD-10-CM | POA: Diagnosis not present

## 2024-07-09 DIAGNOSIS — T8149XA Infection following a procedure, other surgical site, initial encounter: Secondary | ICD-10-CM | POA: Diagnosis not present

## 2024-07-09 DIAGNOSIS — I059 Rheumatic mitral valve disease, unspecified: Secondary | ICD-10-CM | POA: Diagnosis not present

## 2024-07-09 DIAGNOSIS — I330 Acute and subacute infective endocarditis: Secondary | ICD-10-CM | POA: Diagnosis not present

## 2024-07-09 DIAGNOSIS — R7881 Bacteremia: Secondary | ICD-10-CM | POA: Diagnosis not present

## 2024-07-09 DIAGNOSIS — T8131XD Disruption of external operation (surgical) wound, not elsewhere classified, subsequent encounter: Secondary | ICD-10-CM | POA: Diagnosis not present

## 2024-07-09 DIAGNOSIS — B9561 Methicillin susceptible Staphylococcus aureus infection as the cause of diseases classified elsewhere: Secondary | ICD-10-CM | POA: Diagnosis not present

## 2024-07-09 DIAGNOSIS — I509 Heart failure, unspecified: Secondary | ICD-10-CM | POA: Diagnosis not present

## 2024-07-09 DIAGNOSIS — C44209 Unspecified malignant neoplasm of skin of left ear and external auricular canal: Secondary | ICD-10-CM | POA: Diagnosis not present

## 2024-07-09 DIAGNOSIS — A4101 Sepsis due to Methicillin susceptible Staphylococcus aureus: Secondary | ICD-10-CM | POA: Diagnosis not present

## 2024-07-09 DIAGNOSIS — G9341 Metabolic encephalopathy: Secondary | ICD-10-CM | POA: Diagnosis not present

## 2024-07-09 NOTE — Assessment & Plan Note (Signed)
 MSSA bacteremia cleared with source questionable given the findings of endocarditis. Surgical wounds on left neck and left ear healing without evidence of infection. Will continue treatment with Cefazolin  for endocarditis.

## 2024-07-09 NOTE — Assessment & Plan Note (Signed)
 PICC line in place in right upper extremity infusing without complications. Dressing is clean and dry. Continue PICC line care per protocol.

## 2024-07-09 NOTE — Assessment & Plan Note (Signed)
 Philip [name] has been doing well and tolerating Cefazolin  with no adverse side effects. Discussed plan of care to reduce dose of Cefazolin  to q 6 from q 8 which was done for CNS benefits and will complete treatment on 07/31/24 as planned. Continue therapeutic drug monitoring of renal function and inflammatory markers. Will need echocardiogram following treatment.

## 2024-07-09 NOTE — Patient Instructions (Addendum)
 Nice to see you.  Continue to take your medication daily as prescribed.   Plan for follow up with Dr. Overton in 3 weeks or sooner if needed   Have a great day and stay safe!

## 2024-07-09 NOTE — Progress Notes (Signed)
 Subjective:   Patient ID: Philip [name], male    DOB: 1940-11-25, 83 y.o.   MRN: [id number]  Chief Complaint  Patient presents with   Follow-up    MSSA bacteremia and Mitral valve endocarditis     HPI:  Philip [name] is a 83 y.o. male with history of skin cancer and recent skin grafting around his neck and left ear arriving to the hospital with altered mental status and found to have MSSA bacteremia with TEE positive for mobile vegetation consistent with bacterial endocarditis.  Evaluated by cardiothoracic surgery and deemed not a candidate for valve replacement.  Plan of care was for 6 weeks of IV antibiotics with 2 weeks of cefazolin  2 g IV Q 6 through 9/9 and transition to 2 g cefazolin  IV Q8 through 07/31/2024.  Here today for hospitalization follow-up.  Mr. Moist has been doing okay since leaving the hospital and is present today with his wife.  Receiving cefazolin  as prescribed with no adverse side effects through continuous pump.  Has fatigue and tires easily and denies fevers, chills, or night sweats.  PICC line functioning appropriately with dressing changed yesterday.  Surgical wounds on neck and ear appear to be healing adequately.   No Known Allergies    Outpatient Medications Prior to Visit  Medication Sig Dispense Refill   amLODipine  (NORVASC ) 10 MG tablet Take 1 tablet (10 mg total) by mouth at bedtime. 90 tablet 4   Ascorbic Acid (VITAMIN C ER PO) Take 500 mg by mouth daily.     atorvastatin  (LIPITOR) 40 MG tablet Take 1 tablet (40 mg total) by mouth daily. 90 tablet 4   B Complex-Biotin-FA (B-COMPLEX PO) Take 1 tablet by mouth daily.     ceFAZolin  (ANCEF ) IVPB Inject 6-8 g into the vein daily. Indication:  MSSA endocarditis First Dose: Yes Last Day of Therapy:  8g CI thru 9/9, then 6g CI thru 10/1 Labs - Once weekly:  CBC/D and BMP, Labs - Once weekly: ESR and CRP Method of administration: continuous infusion - 8g daily as continuous infusion thru 9/9 then 6g daily  as a continuous infusion thru 10/1 Method of administration may be changed at the discretion of home infusion pharmacist based upon assessment of the patient and/or caregiver's ability to self-administer the medication ordered. 35 Units 0   co-enzyme Q-10 30 MG capsule Take 30 mg by mouth daily.     ELIQUIS  5 MG TABS tablet TAKE 1 TABLET TWICE DAILY 180 tablet 3   magnesium  oxide (MAG-OX) 400 MG tablet Take 800 mg by mouth daily.     metoprolol  tartrate (LOPRESSOR ) 50 MG tablet Take 1 tablet (50 mg total) by mouth 2 (two) times daily. 60 tablet 2   Multiple Vitamins-Minerals (MULTIVITAMIN WITH MINERALS) tablet Take 1 tablet by mouth daily.     Omega-3 Fatty Acids (OMEGA-3 FISH OIL ) 1200 MG CAPS Take 2 capsules (2,400 mg total) by mouth daily. Take 2 1,200 mg capsules daily 90 capsule 3   polyethylene glycol (MIRALAX  / GLYCOLAX ) packet Take 17 g by mouth daily.     hydrocortisone 2.5 % cream Apply 1 application topically as needed (skin irritation). (Patient not taking: Reported on 07/05/2024)     No facility-administered medications prior to visit.     Past Medical History:  Diagnosis Date   AAA (abdominal aortic aneurysm) (HCC)    Arthritis    Atrial fibrillation (HCC)    CAD (coronary artery disease)    had a MI , s/p  CABG   Cataracts, bilateral    immature   CHF (congestive heart failure) (HCC)    Dysrhythmia    paroximal a fib   GERD (gastroesophageal reflux disease)    takes Omeprazole  daily   History of colon polyps    History of kidney stones    History of shingles    Hyperlipidemia    Hypertension    takes Amlodipine ,Metoprolol ,and Lisinopril  daily   Joint pain    Joint swelling    Myocardial infarction [facility])    several with last one being 2001(but never knew about any except 2001)   OSA (obstructive sleep apnea)    started CPAP 12/09   Peripheral vascular disease (HCC)    Peyronie's disease    s/p penile implant in 2006   Pneumonia    as a child   Skin cancer     several , one of them was melanoma (sees derm routinely)   Sleep apnea    Tingling    feet occasionally   Tinnitus      Past Surgical History:  Procedure Laterality Date   ABDOMINAL AORTAGRAM N/A 06/16/2014   Procedure: ABDOMINAL EZELLA;  Surgeon: Lonni GORMAN Blade, MD;  Location: [facility] CATH LAB;  Service: Cardiovascular;  Laterality: N/A;   ABDOMINAL AORTIC ENDOVASCULAR STENT GRAFT N/A 07/08/2014   Procedure: ABDOMINAL AORTIC ENDOVASCULAR STENT GRAFT/ GORE;  Surgeon: Lonni GORMAN Blade, MD;  Location: [facility] OR;  Service: Vascular;  Laterality: N/A;   CARDIAC CATHETERIZATION  10/31/2013   COLONOSCOPY     CORONARY ARTERY BYPASS GRAFT  10/31/1997   x 3 vessels   LEFT HEART CATH AND CORS/GRAFTS ANGIOGRAPHY N/A 10/07/2021   Procedure: LEFT HEART CATH AND CORS/GRAFTS ANGIOGRAPHY;  Surgeon: Verlin Lonni BIRCH, MD;  Location: [facility] INVASIVE CV LAB;  Service: Cardiovascular;  Laterality: N/A;   LEFT HEART CATHETERIZATION WITH CORONARY/GRAFT ANGIOGRAM N/A 05/29/2014   Procedure: LEFT HEART CATHETERIZATION WITH EL BILE;  Surgeon: Ezra GORMAN Shuck, MD;  Location: [facility] CATH LAB;  Service: Cardiovascular;  Laterality: N/A;   PENILE PROSTHESIS IMPLANT  08/31/2005   AMS inflatable penile prosthesis   RHINOPLASTY  10/31/1973   TRANSESOPHAGEAL ECHOCARDIOGRAM (CATH LAB) N/A 06/24/2024   Procedure: TRANSESOPHAGEAL ECHOCARDIOGRAM;  Surgeon: Loni Soyla LABOR, MD;  Location: [facility] INVASIVE CV LAB;  Service: Cardiovascular;  Laterality: N/A;       Review of Systems  Constitutional:  Negative for chills, diaphoresis, fatigue and fever.  Respiratory:  Negative for cough, chest tightness, shortness of breath and wheezing.   Cardiovascular:  Negative for chest pain.  Gastrointestinal:  Negative for abdominal pain, diarrhea, nausea and vomiting.    Objective:   BP (!) 160/64   Pulse (!) 57   Temp (!) 97.4 F (36.3 C) (Oral)   Wt 228 lb (103.4 kg)   SpO2 98%   BMI 33.67 kg/m   Nursing note and vital signs reviewed.  Physical Exam Constitutional:      General: He is not in acute distress.    Appearance: He is well-developed.  Cardiovascular:     Rate and Rhythm: Normal rate and regular rhythm.     Heart sounds: Normal heart sounds.     Comments: PICC line in right upper extremity with dressing that is clean and dry.  Currently infusing Pulmonary:     Effort: Pulmonary effort is normal.     Breath sounds: Normal breath sounds.  Skin:    General: Skin is warm and dry.     Comments: Surgical areas on left  ear and left neck appear to be healing without evidence of infection  Neurological:     Mental Status: He is alert and oriented to person, place, and time.  Psychiatric:        Behavior: Behavior normal.        Thought Content: Thought content normal.        Judgment: Judgment normal.         07/05/2024   11:53 AM 03/27/2024    1:31 PM 02/07/2024    8:22 AM 02/07/2024    8:20 AM 09/08/2023    8:12 AM  Depression screen PHQ 2/9  Decreased Interest 1 0 0 0 0  Down, Depressed, Hopeless 1 0 0 0 0  PHQ - 2 Score 2 0 0 0 0  Altered sleeping 0 1 0  0  Tired, decreased energy 1 0 0  0  Change in appetite 0 0 0  0  Feeling bad or failure about yourself  0 0 0  0  Trouble concentrating 1 0 0  0  Moving slowly or fidgety/restless 1 0 0  0  Suicidal thoughts 0 0 0  0  PHQ-9 Score 5 1 0  0     Assessment & Plan:    Patient Active Problem List   Diagnosis Date Noted   Surgical site infection 06/19/2024    Priority: High   Endocarditis of mitral valve 07/09/2024   Peripherally inserted central catheter (PICC) in place 07/09/2024   Sepsis (HCC) 06/18/2024   MSSA bacteremia 06/18/2024   Fever 06/17/2024   Altered mental status 06/17/2024   Cough 12/05/2022   Postural dizziness with presyncope 10/06/2021   Elevated TSH 10/06/2021   CAD (coronary artery disease) 10/06/2021   AAA (abdominal aortic aneurysm) (HCC) 10/06/2021   Renal mass 10/06/2021    Elevated troponin 10/06/2021   NSTEMI (non-ST elevated myocardial infarction) (HCC) 10/06/2021   Skin cancer of face 08/05/2020   Depression 04/02/2019   Lower extremity edema 12/22/2017   Coronary artery disease involving native coronary artery of native heart without angina pectoris 12/22/2017   Other specified hypothyroidism 10/02/2017   Osteoarthritis of left hip 06/26/2017   Obesity (BMI 30.0-34.9) 05/08/2017   Hyperglycemia 04/19/2017   Vitamin D  deficiency 03/29/2017   Idiopathic peripheral neuropathy 03/13/2017   Erectile dysfunction 02/22/2017   PCP NOTES >>>>>>>>>>>>>>>>>>>>>>>>>>>>>>>>>>>>>>> 12/21/2015   Chronic constipation 12/21/2015   Atrial fibrillation (HCC) 05/07/2014   Encounter for pre-operative cardiovascular clearance 05/07/2014   AAA (abdominal aortic aneurysm) without rupture (HCC) 04/30/2014   Fatigue 11/25/2013   Hypomagnesemia 06/29/2011   Annual physical exam 02/16/2011   Insomnia 02/16/2011   OSA (obstructive sleep apnea)    History of skin cancer 07/24/2008   Coronary atherosclerosis 07/04/2008   Mixed hyperlipidemia 02/08/2007   Essential hypertension 02/08/2007     Problem List Items Addressed This Visit       Cardiovascular and Mediastinum   Endocarditis of mitral valve   Mr. Kicklighter has been doing well and tolerating Cefazolin  with no adverse side effects. Discussed plan of care to reduce dose of Cefazolin  to q 6 from q 8 which was done for CNS benefits and will complete treatment on 07/31/24 as planned. Continue therapeutic drug monitoring of renal function and inflammatory markers. Will need echocardiogram following treatment.         Other   MSSA bacteremia - Primary   MSSA bacteremia cleared with source questionable given the findings of endocarditis. Surgical wounds on left neck and left ear  healing without evidence of infection. Will continue treatment with Cefazolin  for endocarditis.       Peripherally inserted central catheter (PICC)  in place   PICC line in place in right upper extremity infusing without complications. Dressing is clean and dry. Continue PICC line care per protocol.         I am having Lamar MICAEL Bruch maintain his multivitamin with minerals, co-enzyme Q-10, B Complex-Biotin-FA (B-COMPLEX PO), magnesium  oxide, hydrocortisone, polyethylene glycol, Ascorbic Acid (VITAMIN C ER PO), Omega-3 Fish Oil , Eliquis , amLODipine , atorvastatin , ceFAZolin , and metoprolol  tartrate.   OPAT Orders Discharge antibiotics to be given via PICC line Discharge antibiotics: Cefazolin  2 g IV q 6 hours  Per pharmacy protocol   End Date: 07/31/24  [facility] Care Per Protocol:  Home health RN for IV administration and teaching; PICC line care and labs.    Labs weekly while on IV antibiotics: _X_ CBC with differential _X_ BMP __ CMP _X_ CRP _X_ ESR __ Vancomycin  trough __ CK  _X_ Please pull PIC at completion of IV antibiotics __ Please leave PIC in place until doctor has seen patient or been notified  Fax weekly labs to [phone]   Follow-up: Return in about 3 weeks (around 07/30/2024). or sooner if needed.   Cathlyn July, MSN, FNP-C Nurse Practitioner [facility] for Infectious Disease [facility] Main number: [phone]

## 2024-07-10 DIAGNOSIS — R7881 Bacteremia: Secondary | ICD-10-CM | POA: Diagnosis not present

## 2024-07-10 DIAGNOSIS — B9561 Methicillin susceptible Staphylococcus aureus infection as the cause of diseases classified elsewhere: Secondary | ICD-10-CM | POA: Diagnosis not present

## 2024-07-10 DIAGNOSIS — T8149XA Infection following a procedure, other surgical site, initial encounter: Secondary | ICD-10-CM | POA: Diagnosis not present

## 2024-07-12 ENCOUNTER — Other Ambulatory Visit: Payer: Self-pay

## 2024-07-12 NOTE — Patient Instructions (Signed)
 Visit Information  Thank you for taking time to visit with me today. Please don't hesitate to contact me if I can be of assistance to you before our next scheduled telephone appointment.  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    Medication management barrier Wife reports they did not receive the New Metoprolol -has this med now   Goal:  Over the next 30 days, the patient will not experience hospital readmission  Interventions:  Transitions of Care: Doctor Visits  - discussed the importance of doctor visits Contacted provider for patient needs regarding Metoprolol - wife will pick up from pharmacy Post-op wound/incision care reviewed with patient/caregiver Reviewed Signs and symptoms of infection Reviewed wound care and IV antibiotic adminstration per d/c summary Advised wife that [facility] is [facility] as both are listed on d/c summary for issues with IV medication - patient has PICC line right arm Educated to [employer] with any issues/concerns who can make a visit prior to their next scheduled visit - advised TOC RN will not be in office Monday due to holiday 07/05/24: Patient had appt 9/2 with Jason Leita Repine, FNP - VSS - PICC in place, Home health visit 9/2 --Okay for PT.  -  Call if BP more than 140/75 consistently. - If there is any mental status changes, acute difficulty with motor strength or speech: ER  - Per my call - Patient reports some confusion today but no worse than when he left hospital - No BM x 2 days - education Miralax /warm prune juice - discussed Son who is POA helping with medication decisions related to confusion - wife states patient could not remember password  on computer - TOC encouraged wife to call PCP office and request Neuro referral as wife states patient had mild stroke in hospital and she would like him to see neuro.  07/12/24: Patient states he is doing better  - saw infectious disease, Cordella July 07/09/24 and note states decrease from Q8 to Q6  and we called [facility] at [phone] and spoke with pharmacist, Massie - who states patient has elastomeric pump and the decrease dose is inside the bulb not in Qhours.  Patient feels he is getting stronger with PT - wife states she is seeing the improvement and has decided to wait until PCP appointment 10/7 to discuss if patient needs neurology referral.   Patient Self Care Activities:  Attend all scheduled provider appointments - 07/05/24 wife stated she was thinking of cancelling 07/09/24 appointment and [facility] RN educated this is with Infectious Disease and is important and wife agreed to keep appointment Call pharmacy for medication refills 3-7 days in advance of running out of medications Call provider office for new concerns or questions  Notify RN Care Manager of TOC call rescheduling needs Participate in Transition of Care Program/Attend TOC scheduled calls Take medications as prescribed    Plan:  Telephone follow up appointment with care management team member scheduled for:  07/19/24 11am The patient has been provided with contact information for the care management team and has been advised to call with any health related questions or concerns.  Patient verbalizes understanding of instructions and care plan provided today and agrees to view in MyChart. Active MyChart status and patient understanding of how to access instructions and care plan via MyChart confirmed with patient.     Telephone follow up appointment with care management team member scheduled for: 07/19/24 The patient has been provided with contact information for the care management team and has been advised to call with  any health related questions or concerns.   Please call the care guide team at [phone] if you need to cancel or reschedule your appointment.   Please call the Suicide and Crisis Lifeline: 988 call 1-800-273-TALK (toll free, 24 hour hotline) call 911 if you are experiencing a Mental Health or Behavioral Health Crisis or need someone to talk to.  Shona Prow RN, CCM [place]  VBCI-Population Health RN Care Manager [phone]

## 2024-07-12 NOTE — Transitions of Care (Post Inpatient/ED Visit) (Signed)
 Transition of Care week 3  Visit Note  07/12/2024  Name: Philip [name] MRN: [id number]          DOB: 10/27/1941  Situation: Patient enrolled in [facility] 30-day program. Visit completed with patient and wife by telephone.   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   - mild stroke reported by wife  Initial Transition Care Management Follow-up Telephone Call    Past Medical History:  Diagnosis Date   AAA (abdominal aortic aneurysm) (HCC)    Arthritis    Atrial fibrillation (HCC)    CAD (coronary artery disease)    had a MI , s/p CABG   Cataracts, bilateral    immature   CHF (congestive heart failure) (HCC)    Dysrhythmia    paroximal a fib   GERD (gastroesophageal reflux disease)    takes Omeprazole  daily   History of colon polyps    History of kidney stones    History of shingles    Hyperlipidemia    Hypertension    takes Amlodipine ,Metoprolol ,and Lisinopril  daily   Joint pain    Joint swelling    Myocardial infarction (HCC)    several with last one being 2001(but never knew about any except 2001)   OSA (obstructive sleep apnea)    started CPAP 12/09   Peripheral vascular disease (HCC)    Peyronie's disease    s/p penile implant in 2006   Pneumonia    as a child   Skin cancer    several , one of them was melanoma (sees derm routinely)   Sleep apnea    Tingling    feet occasionally   Tinnitus     Assessment: Patient Reported Symptoms: Cognitive Cognitive Status: No symptoms reported, Normal speech and language skills, Alert and oriented to person, place, and time, Struggling with memory recall      Neurological Neurological Review of Symptoms: Other: [place] Neurological Symptoms/Conditions [RPT]: wife and patient states seeing improvement and plan to discuss neuro referral with PCP 10/7    HEENT HEENT Symptoms Reported: No symptoms reported       Cardiovascular Cardiovascular Symptoms Reported: Other: Other Cardiovascular Symptoms: 9/9 appt Cathlyn July ID  BP 160/64 HR 57 discussed with patient who feels was off because he was at MD office -    Patient reports home findings with no symptoms 9/11 123/63 today 128/63  56 HR Cardiovascular Self-Management Outcome: 4 (good)  Respiratory Respiratory Symptoms Reported: No symptoms reported    Endocrine Endocrine Symptoms Reported: No symptoms reported    Gastrointestinal Gastrointestinal Symptoms Reported: Constipation Additional Gastrointestinal Details: patient states he had a good BM 2 days ago and is taking the Miralax  and plans to try prune juice Gastrointestinal Management Strategies: Medication therapy    Genitourinary Genitourinary Symptoms Reported: Difficulty initiating stream Additional Genitourinary Details: Patient states at times it seems normal    Integumentary Integumentary Symptoms Reported: Wound Additional Integumentary Details: patient states both areas are healing and has not needed any wound care the past few days and states the Home Health nurse looks at it    Musculoskeletal Musculoskelatal Symptoms Reviewed: Other Other Musculoskeletal Symptoms: patient  reports he is feeling stronger and continues to work with PT        Psychosocial Psychosocial Symptoms Reported: Depression - if selected complete PHQ 2-9         Vitals:   07/11/24 1030 07/12/24 1000  BP: 123/63 128/63  Pulse:  (!) 56    Medications Reviewed Today     Reviewed by Lauro Shona LABOR, RN (Registered Nurse) on 07/12/24 at 1153  Med List Status: <None>   Medication Order Taking? Sig Documenting Provider Last Dose Status Informant  amLODipine  (NORVASC ) 10 MG tablet [id number] Yes Take 1 tablet (10 mg total) by mouth at bedtime. Court Dorn PARAS, MD  Active Pharmacy Records, Spouse/Significant Other  Ascorbic Acid (VITAMIN C ER PO) [id number] Yes Take 500 mg by mouth daily. [provider]  Active Pharmacy Records, Spouse/Significant Other  atorvastatin  (LIPITOR) 40 MG tablet [id number] Yes Take 1 tablet (40 mg total) by mouth daily. Court Dorn PARAS, MD  Active Pharmacy Records, Spouse/Significant Other  B Complex-Biotin-FA (B-COMPLEX PO) [id number] Yes Take 1 tablet by mouth daily. [provider]  Active Pharmacy Records, Spouse/Significant Other  ceFAZolin  (ANCEF ) IVPB [id number] Yes Inject 6-8 g into the vein daily. Indication:  MSSA endocarditis First Dose: Yes Last Day of Therapy:  8g CI thru 9/9, then 6g CI thru 10/1 Labs - Once weekly:  CBC/D and BMP, Labs - Once weekly: ESR and CRP Method of administration: continuous infusion - 8g daily as continuous infusion thru 9/9 then 6g daily as a continuous infusion thru 10/1 Method of administration may be changed at the discretion of home infusion pharmacist based upon assessment of the patient and/or caregiver's ability to self-administer the medication ordered. Overton Faith T, MD  Active   co-enzyme Q-10 30 MG capsule [id number] Yes Take 30 mg by mouth daily. [provider]  Active Pharmacy Records, Spouse/Significant Other  ELIQUIS  5 MG TABS tablet [id number] Yes TAKE 1 TABLET TWICE DAILY Wyn Ernest H Jr., NP  Active Pharmacy Records, Spouse/Significant Other  magnesium  oxide (MAG-OX) 400 MG tablet [id number] Yes Take 800 mg by mouth daily. [provider]  Active Pharmacy Records, Spouse/Significant Other  metoprolol  tartrate (LOPRESSOR ) 50 MG tablet [id number] Yes Take 1 tablet (50 mg total) by mouth 2 (two) times daily. Drusilla Sabas RAMAN, MD  Active   Multiple Vitamins-Minerals (MULTIVITAMIN WITH MINERALS) tablet [id number] Yes Take 1 tablet by mouth daily. [provider]  Active Pharmacy Records, Spouse/Significant Other  Omega-3 Fatty Acids (OMEGA-3 FISH OIL ) 1200 MG CAPS [id number] Yes Take 2 capsules (2,400 mg total) by mouth daily. Take 2 1,200 mg capsules daily Eckard, Tammy,  RPH-CPP  Active Pharmacy Records, Spouse/Significant Other  polyethylene glycol (MIRALAX  / GLYCOLAX ) packet [id number] Yes Take 17 g by mouth daily. [provider]  Active Pharmacy Records, Spouse/Significant Other            Recommendation:   Continue Current Plan of Care  Follow Up Plan:   Telephone follow up appointment date/time:  07/19/24 11am  Shona Lauro RN, CCM [place]  VBCI-Population Health RN Care Manager [phone]

## 2024-07-13 DIAGNOSIS — T8149XA Infection following a procedure, other surgical site, initial encounter: Secondary | ICD-10-CM | POA: Diagnosis not present

## 2024-07-13 DIAGNOSIS — B9561 Methicillin susceptible Staphylococcus aureus infection as the cause of diseases classified elsewhere: Secondary | ICD-10-CM | POA: Diagnosis not present

## 2024-07-13 DIAGNOSIS — R7881 Bacteremia: Secondary | ICD-10-CM | POA: Diagnosis not present

## 2024-07-14 ENCOUNTER — Emergency Department (HOSPITAL_COMMUNITY)
Admission: EM | Admit: 2024-07-14 | Discharge: 2024-07-15 | Disposition: A | Discharge status: Home / Self Care | Attending: Emergency Medicine | Admitting: Emergency Medicine

## 2024-07-14 ENCOUNTER — Other Ambulatory Visit: Payer: Self-pay

## 2024-07-14 ENCOUNTER — Encounter (HOSPITAL_COMMUNITY): Payer: Self-pay | Admitting: Emergency Medicine

## 2024-07-14 DIAGNOSIS — Z452 Encounter for adjustment and management of vascular access device: Secondary | ICD-10-CM

## 2024-07-14 DIAGNOSIS — Z7901 Long term (current) use of anticoagulants: Secondary | ICD-10-CM | POA: Diagnosis not present

## 2024-07-14 DIAGNOSIS — I2510 Atherosclerotic heart disease of native coronary artery without angina pectoris: Secondary | ICD-10-CM | POA: Diagnosis not present

## 2024-07-14 DIAGNOSIS — R7881 Bacteremia: Secondary | ICD-10-CM | POA: Diagnosis not present

## 2024-07-14 DIAGNOSIS — Z79899 Other long term (current) drug therapy: Secondary | ICD-10-CM | POA: Diagnosis not present

## 2024-07-14 DIAGNOSIS — N1831 Chronic kidney disease, stage 3a: Secondary | ICD-10-CM | POA: Insufficient documentation

## 2024-07-14 DIAGNOSIS — Z8619 Personal history of other infectious and parasitic diseases: Secondary | ICD-10-CM | POA: Diagnosis not present

## 2024-07-14 DIAGNOSIS — B9561 Methicillin susceptible Staphylococcus aureus infection as the cause of diseases classified elsewhere: Secondary | ICD-10-CM

## 2024-07-14 DIAGNOSIS — I129 Hypertensive chronic kidney disease with stage 1 through stage 4 chronic kidney disease, or unspecified chronic kidney disease: Secondary | ICD-10-CM | POA: Insufficient documentation

## 2024-07-14 DIAGNOSIS — Y828 Other medical devices associated with adverse incidents: Secondary | ICD-10-CM | POA: Insufficient documentation

## 2024-07-14 DIAGNOSIS — I482 Chronic atrial fibrillation, unspecified: Secondary | ICD-10-CM | POA: Diagnosis not present

## 2024-07-14 DIAGNOSIS — T82524A Displacement of infusion catheter, initial encounter: Secondary | ICD-10-CM | POA: Insufficient documentation

## 2024-07-14 DIAGNOSIS — T829XXA Unspecified complication of cardiac and vascular prosthetic device, implant and graft, initial encounter: Secondary | ICD-10-CM

## 2024-07-14 DIAGNOSIS — T82898A Other specified complication of vascular prosthetic devices, implants and grafts, initial encounter: Secondary | ICD-10-CM | POA: Diagnosis not present

## 2024-07-14 LAB — COMPREHENSIVE METABOLIC PANEL WITH GFR
ALT: <5 U/L (ref 0–44)
AST: 31 U/L (ref 15–41)
Albumin: 4.2 g/dL (ref 3.5–5.0)
Alkaline Phosphatase: 58 U/L (ref 38–126)
Anion gap: 14 (ref 5–15)
BUN: 15 mg/dL (ref 8–23)
CO2: 27 mmol/L (ref 22–32)
Calcium: 10.1 mg/dL (ref 8.9–10.3)
Chloride: 97 mmol/L — ABNORMAL LOW (ref 98–111)
Creatinine, Ser: 1.18 mg/dL (ref 0.61–1.24)
GFR, Estimated: >60 mL/min (ref >60)
Glucose, Bld: 107 mg/dL — ABNORMAL HIGH (ref 70–99)
Potassium: 4.4 mmol/L (ref 3.5–5.1)
Sodium: 138 mmol/L (ref 135–145)
Total Bilirubin: 0.8 mg/dL (ref 0.0–1.2)
Total Protein: 7.3 g/dL (ref 6.5–8.1)

## 2024-07-14 LAB — CBC WITH DIFFERENTIAL/PLATELET
Abs Immature Granulocytes: 0.01 K/uL (ref 0.00–0.07)
Basophils Absolute: 0 K/uL (ref 0.0–0.1)
Basophils Relative: 0 %
Eosinophils Absolute: 0.1 K/uL (ref 0.0–0.5)
Eosinophils Relative: 2 %
HCT: 39.7 % (ref 39.0–52.0)
Hemoglobin: 13.2 g/dL (ref 13.0–17.0)
Immature Granulocytes: 0 %
Lymphocytes Relative: 38 %
Lymphs Abs: 1.8 K/uL (ref 0.7–4.0)
MCH: 32.4 pg (ref 26.0–34.0)
MCHC: 33.2 g/dL (ref 30.0–36.0)
MCV: 97.3 fL (ref 80.0–100.0)
Monocytes Absolute: 0.7 K/uL (ref 0.1–1.0)
Monocytes Relative: 14 %
Neutro Abs: 2.2 K/uL (ref 1.7–7.7)
Neutrophils Relative %: 46 %
Platelets: 71 K/uL — ABNORMAL LOW (ref 150–400)
RBC: 4.08 MIL/uL — ABNORMAL LOW (ref 4.22–5.81)
RDW: 13.2 % (ref 11.5–15.5)
WBC: 4.8 K/uL (ref 4.0–10.5)
nRBC: 0 % (ref 0.0–0.2)

## 2024-07-14 MED ORDER — ATORVASTATIN CALCIUM 40 MG PO TABS: 40.0000 mg | ORAL_TABLET | Freq: Every day | ORAL | Status: DC

## 2024-07-14 MED ORDER — METOPROLOL TARTRATE 25 MG PO TABS
50.0000 mg | ORAL_TABLET | Freq: Two times a day (BID) | ORAL | Status: DC
  Administered 2024-07-14: 50 mg via ORAL
  Filled 2024-07-14: qty 2

## 2024-07-14 MED ORDER — CEFAZOLIN SODIUM-DEXTROSE 2-4 GM/100ML-% IV SOLN
2.0000 g | Freq: Once | INTRAVENOUS | Status: AC
  Administered 2024-07-14: 2 g via INTRAVENOUS
  Filled 2024-07-14: qty 100

## 2024-07-14 MED ORDER — CEFAZOLIN SODIUM-DEXTROSE 2-4 GM/100ML-% IV SOLN
2.0000 g | Freq: Three times a day (TID) | INTRAVENOUS | Status: DC
  Administered 2024-07-15: 2 g via INTRAVENOUS
  Filled 2024-07-14: qty 100

## 2024-07-14 MED ORDER — APIXABAN 5 MG PO TABS
5.0000 mg | ORAL_TABLET | Freq: Two times a day (BID) | ORAL | Status: DC
  Administered 2024-07-14: 5 mg via ORAL
  Filled 2024-07-14: qty 1

## 2024-07-14 MED ORDER — AMLODIPINE BESYLATE 5 MG PO TABS
10.0000 mg | ORAL_TABLET | Freq: Every day | ORAL | Status: DC
  Administered 2024-07-14: 10 mg via ORAL
  Filled 2024-07-14: qty 2

## 2024-07-14 NOTE — ED Triage Notes (Signed)
 Pt states his PICC line came out today. Pt was receiving ancef  through it continuously until October.

## 2024-07-14 NOTE — ED Provider Notes (Signed)
 [place] [facility] Provider Note   CSN: [id number] Arrival date & time: 07/14/24  1345     History Chief Complaint  Patient presents with   Vascular Access Problem    HPI: RENALD [name] is a 82 y.o. male with history pertinent for MSSA bacteremia and endocarditis on IV cefazolin  until 07/31/2024, HLD, HTN, A-fib on Eliquis , CAD, AAA, CKD who presents complaining of PICC line displacement. Patient arrived via POV accompanied by wife.  History provided by patient.  No interpreter required during this encounter.  Patient reports that he was in his usual state of health, denies any recent fever, chills, chest pain, shortness of breath, other symptoms.  Reports that he has been adherent to his IV cefazolin .  Reports that his most recent dose was at 3:30 PM yesterday, and he was due for dose at 3:30 PM today.  Reports that he was eating lunch when he felt something went on his arm, and when he took off his shirt he noticed blood trickling down his arm, and noticed that his PICC had become fully dislodged.  He is unaware how exactly the PICC became dislodged.  Reports that he initially had significant bleeding, however they placed a Coban dressing on it, and the bleeding has since resolved spontaneously.    Patient's recorded medical, surgical, social, medication list and allergies were reviewed in the Snapshot window as part of the initial history.   Prior to Admission medications   Medication Sig Start Date End Date Taking? Authorizing Provider  amLODipine  (NORVASC ) 10 MG tablet Take 1 tablet (10 mg total) by mouth at bedtime. 06/10/24  Yes Court Dorn PARAS, MD  Ascorbic Acid (VITAMIN C ER PO) Take 500 mg by mouth daily.   Yes [provider]  atorvastatin  (LIPITOR) 40 MG tablet Take 1 tablet (40 mg total) by mouth daily. 06/10/24  Yes Court Dorn PARAS, MD  B Complex-Biotin-FA (B-COMPLEX PO) Take 1 tablet by mouth daily.   Yes [provider]  ceFAZolin  (ANCEF ) IVPB Inject 6-8 g into the vein daily. Indication:  MSSA endocarditis First Dose: Yes Last Day of Therapy:  8g CI thru 9/9, then 6g CI thru 10/1 Labs - Once weekly:  CBC/D and BMP, Labs - Once weekly: ESR and CRP Method of administration: continuous infusion - 8g daily as continuous infusion thru 9/9 then 6g daily as a continuous infusion thru 10/1 Method of administration may be changed at the discretion of home infusion pharmacist based upon assessment of the patient and/or caregiver's ability to self-administer the medication ordered. 06/26/24 07/31/24 Yes Vu, Constance DASEN, MD  co-enzyme Q-10 30 MG capsule Take 30 mg by mouth daily.   Yes [provider]  ELIQUIS  5 MG TABS tablet TAKE 1 TABLET TWICE DAILY 02/07/24  Yes Dick, Ernest H Jr., NP  magnesium  oxide (MAG-OX) 400 MG tablet Take 800 mg by mouth daily.   Yes [provider]  metoprolol  tartrate (LOPRESSOR ) 50 MG tablet Take 1 tablet (50 mg total) by mouth 2 (two) times daily. 06/27/24  Yes Drusilla Sabas RAMAN, MD  Multiple Vitamins-Minerals (MULTIVITAMIN WITH MINERALS) tablet Take 1 tablet by mouth daily.   Yes [provider]  Omega-3 Fatty Acids (OMEGA-3 FISH OIL ) 1200 MG CAPS Take 2 capsules (2,400 mg total) by mouth daily. Take 2 1,200 mg capsules daily 11/11/21  Yes Eckard, Tammy, RPH-CPP  polyethylene glycol (MIRALAX  / GLYCOLAX ) packet Take 17 g by mouth daily.   Yes [provider]     Allergies: Patient has no known allergies.   Review of Systems   ROS as per HPI  Physical Exam Updated Vital Signs BP (!) 144/70 (BP Location: Right Arm)   Pulse (!) 51   Temp 98.1 F (36.7 C) (Oral)   Resp 18   Ht 5' 9 (1.753 m)   Wt 103.4 kg   SpO2 93%   BMI 33.66 kg/m  Physical Exam Vitals and nursing note reviewed.  Constitutional:      General: He is not in acute distress.    Appearance: He is well-developed.  HENT:     Head: Normocephalic and atraumatic.  Eyes:      Conjunctiva/sclera: Conjunctivae normal.  Cardiovascular:     Rate and Rhythm: Normal rate and regular rhythm.     Heart sounds: No murmur heard. Pulmonary:     Effort: Pulmonary effort is normal. No respiratory distress.     Breath sounds: Normal breath sounds.  Abdominal:     Palpations: Abdomen is soft.     Tenderness: There is no abdominal tenderness.  Musculoskeletal:        General: No swelling.     Cervical back: Neck supple.     Comments: Hemostatic punctate wound on right volar upper arm, see image  Skin:    General: Skin is warm and dry.     Capillary Refill: Capillary refill takes less than 2 seconds.  Neurological:     Mental Status: He is alert.  Psychiatric:        Mood and Affect: Mood normal.     ED Course/ Medical Decision Making/ A&P    Procedures Procedures   Medications Ordered in ED Medications  ceFAZolin  (ANCEF ) IVPB 2g/100 mL premix (has no administration in time range)  amLODipine  (NORVASC ) tablet 10 mg (10 mg Oral Given 07/14/24 2120)  atorvastatin  (LIPITOR) tablet 40 mg (has no administration in time range)  metoprolol  tartrate (LOPRESSOR ) tablet 50 mg (50 mg Oral Given 07/14/24 2120)  apixaban  (ELIQUIS ) tablet 5 mg (5 mg Oral Given 07/14/24 2120)  ceFAZolin  (ANCEF ) IVPB 2g/100 mL premix (0 g Intravenous Stopped 07/14/24 2019)    Medical Decision Making:   Philip [name] is a 83 y.o. male who presents for PICC displacement as per above.  Physical exam is pertinent for hemostatic punctate wound on the right volar upper arm.   The differential includes but is not limited to PICC displacement, persistent bacteremia, missed antibiotics.  Independent historian: Spouse/partner  External data reviewed: Notes: Reviewed patient's recent discharge summary, he was discharged on 8/28 with IV cefazolin  with total of 8 g continuously over 24 hours, was seen in infectious disease clinic on 9/9, and from 9/9 through 10/1, needs 6 g cefazolin  daily continuously  over 24 hours  Initial Plan:  Blood culture given history of bacteremia Screening labs including CBC and Metabolic panel to evaluate for infectious or metabolic etiology of disease.   Labs: Ordered, Independent interpretation, and Details: CBC without leukocytosis, anemia, thrombocytopenia.  CMP without AKI, emergent electrolyte derangement, emergent LFT abnormality.  Blood culture in process  Radiology: Not indicated   EKG/Medicine tests: Not indicated EKG Interpretation:    Interventions: Cefazolin   See the EMR for full details regarding lab and imaging results.  Patient overall well-appearing on exam, has not had any sick symptoms, has hemostatic wound, no underlying hematoma.  Given missed dose of antibiotics, 2 g of cefazolin  IV ordered, screening labs and single blood culture obtained at time of  IV placement for IV cefazolin  given history of bacteremia.  Attempted to get PICC placed in the emergency department, not available, did discuss with medicine to admit for PICC placement and continued IV antibiotics in the interim.  Medicine without bed, already boarding patients in the ED, therefore medicine consulted, and will help to arrange PICC placement in the a.m., however patient remained a ED patient.  No medications ordered, diet ordered, no additional acute events while patient was under my care.  Plan at the time of signout, monitor patient overnight, hopeful discharge in the a.m. after PICC placement.  Presentation is most consistent with acute complicated illness and Current presentation is complicated by underlying chronic conditions  Discussion of management or test interpretations with external provider(s): Medicine consult, Dr. Keturah  Risk Drugs:Prescription drug management  Disposition: Pending at the time of handoff, hopeful discharge in the a.m. after replacement of PICC line  MDM generated using voice dictation software and may contain dictation errors.  Please  contact me for any clarification or with any questions.  Clinical Impression:  1. Complication associated with peripherally inserted central catheter (PICC), initial encounter   2. Needs peripherally inserted central catheter (PICC)      Admit   Final Clinical Impression(s) / ED Diagnoses Final diagnoses:  Complication associated with peripherally inserted central catheter (PICC), initial encounter  Needs peripherally inserted central catheter (PICC)    Rx / DC Orders ED Discharge Orders     None        Rogelia Jerilynn RAMAN, MD 07/15/24 0015

## 2024-07-14 NOTE — Consult Note (Signed)
 Brief [facility] consult:   48 M with hx AF on Eliquis , CAD s/p CABG 1999, AAA s/p stenting, CKD IIIa, HTN, with recent admission 8/18-28 for MSSA endocarditis of native MV, c/b septic cerebral emboli, who had PICC line placed for Cefazolin  planned EOT 10/1. Presented to ED today noting PICC line had inadvertently been removed. RFA for PICC line placement. On interview he has no complaints other than PICC line coming out. Exam regular, CTAB, 1+ pitting edema LE, L neck + ear wound are scabbed over, no open wound, no surrounding sign of infection. Labs reviewed chemistries unremarkable, WBC 4, PLT 71 has intermittent thrombocytopenia on recent labs. Repeat Bcx obtained in the ED.   A/P:   Disposition  - Does not want to wait in the ED overnight for PICC in the morning, which is reasonable. He would like to be discharged and return tomorrow morning to have the PICC line replaced. I advised him to return before 6 AM tomorrow for next dose of antibiotics. Advised may end up being delay in PICC line placement depending on waiting room at the time, and he understands.   PICC line dislodged  - Order for PICC replacement  - Has [facility] for PICC care + lab monitoring via ID   MSSA Endocarditis, native MV  - Was evaluated by CT surgery, not candidate for valve replacement  - Seen by ID 9/9, plan for Cefazolin  2 g IV q 8 hr with EOT 10/1, continue   L neck wound  - Continue wound care   Chronic medical problems:  Afib: Continue home Eliquis   CAD / CABG: On DOAC per above, continue Atorvastatin   AAA s/p stent: Noted, OP surveillance  CKD 3a: Cr stable  HTN: Continue home amlodipine   Thrombocytopenia: PLT 71, OK to continue [facility] with close monitoring PLT outpatient   Dorn Dawson, MD  [facility]

## 2024-07-14 NOTE — ED Provider Triage Note (Signed)
 Emergency Medicine Provider Triage Evaluation Note  Philip [name] , a 83 y.o. male  was evaluated in triage.  Pt reports he accidentally pulled out his PICC line earlier today. He is on daily ancef  for endocarditis. Last infusion was yesterday at 3:30pm, is due for another infusion today at 3:30pm. Is scheduled to have infusions into October. Feels otherwise well.  Review of Systems  Positive:  Negative:   Physical Exam  BP (!) 145/76 (BP Location: Left Arm)   Pulse 63   Temp 98.4 F (36.9 C)   Resp 16   Ht 5' 9 (1.753 m)   Wt 103.4 kg   SpO2 98%   BMI 33.66 kg/m  Gen:   Awake, no distress   Resp:  Normal effort  MSK:   Moves extremities without difficulty  Other:  Right arm is wrapped, pt brought PICC with him in a bag  Medical Decision Making  Medically screening exam initiated at 2:38 PM.  Appropriate orders placed.  Philip [name] was informed that the remainder of the evaluation will be completed by another provider, this initial triage assessment does not replace that evaluation, and the importance of remaining in the ED until their evaluation is complete.     Nora Lauraine LABOR, PA-C 07/14/24 1440

## 2024-07-15 ENCOUNTER — Ambulatory Visit: Attending: Physician Assistant | Admitting: Physician Assistant

## 2024-07-15 VITALS — BP 138/78 | HR 54 | Ht 69.0 in | Wt 221.0 lb

## 2024-07-15 DIAGNOSIS — I714 Abdominal aortic aneurysm, without rupture, unspecified: Secondary | ICD-10-CM | POA: Diagnosis not present

## 2024-07-15 DIAGNOSIS — I10 Essential (primary) hypertension: Secondary | ICD-10-CM

## 2024-07-15 DIAGNOSIS — I2510 Atherosclerotic heart disease of native coronary artery without angina pectoris: Secondary | ICD-10-CM | POA: Diagnosis not present

## 2024-07-15 DIAGNOSIS — E782 Mixed hyperlipidemia: Secondary | ICD-10-CM | POA: Diagnosis not present

## 2024-07-15 MED ORDER — SODIUM CHLORIDE 0.9% FLUSH: 10.0000 mL | INTRAVENOUS | Status: DC | PRN

## 2024-07-15 MED ORDER — HEPARIN SOD (PORK) LOCK FLUSH 100 UNIT/ML IV SOLN
250.0000 [IU] | Freq: Once | INTRAVENOUS | Status: AC | PRN
  Administered 2024-07-15: 250 [IU]
  Filled 2024-07-15: qty 5

## 2024-07-15 MED ORDER — CHLORHEXIDINE GLUCONATE CLOTH 2 % EX PADS: 6.0000 | MEDICATED_PAD | Freq: Every day | CUTANEOUS | Status: DC

## 2024-07-15 NOTE — ED Notes (Signed)
 PICC team at bedtime

## 2024-07-15 NOTE — Progress Notes (Signed)
 Peripherally Inserted Central Catheter Placement  The IV Nurse has discussed with the patient and/or persons authorized to consent for the patient, the purpose of this procedure and the potential benefits and risks involved with this procedure.  The benefits include less needle sticks, lab draws from the catheter, and the patient may be discharged home with the catheter. Risks include, but not limited to, infection, bleeding, blood clot (thrombus formation), and puncture of an artery; nerve damage and irregular heartbeat and possibility to perform a PICC exchange if needed/ordered by physician.  Alternatives to this procedure were also discussed.  Bard Power PICC patient education guide, fact sheet on infection prevention and patient information card has been provided to patient /or left at bedside.    PICC Placement Documentation  PICC Single Lumen 07/15/24 Right Basilic 39 cm 0 cm (Active)  Indication for Insertion or Continuance of Line Home intravenous therapies (PICC only) 07/15/24 0842  Exposed Catheter (cm) 0 cm 07/15/24 0842  Site Assessment Clean, Dry, Intact 07/15/24 0842  Line Status Flushed;Saline locked;Blood return noted 07/15/24 0842  Dressing Type Transparent;Securing device 07/15/24 0842  Dressing Status Antimicrobial disc/dressing in place;Clean, Dry, Intact 07/15/24 0842  Line Care Connections checked and tightened 07/15/24 0842  Line Adjustment (NICU/IV Team Only) No 07/15/24 0842  Dressing Intervention New dressing;Adhesive placed at insertion site (IV team only) 07/15/24 0842  Dressing Change Due 07/22/24 07/15/24 0842       Jolee Na 07/15/2024, 8:43 AM

## 2024-07-15 NOTE — ED Provider Notes (Signed)
 Received signout from overnight team.  Dispo pending PICC line placement.  PICC line then placed.  He is not due for antibiotics until this afternoon.  Will discharge in stable condition   Neysa Caron PARAS, DO 07/15/24 1008

## 2024-07-15 NOTE — Progress Notes (Signed)
 Cardiology Office Note   Date:  07/15/2024  ID:  Philip [name], DOB [birth year], MRN [id number] PCP: [name], MD  [facility] Providers Cardiologist:  Dorn Lesches, MD   History of Present Illness Philip [name] is a 83 y.o. male with a past medical history of obesity, hypertension, hyperlipidemia, coronary atherosclerosis, and abdominal aortic aneurysm here for follow-up appointment.  He is a father of 3 and grandfather of 7 grandchildren who is retired from the [employer].  He has never had a stroke but had bypass grafting in the past at [facility] in 1999.  Also had endoluminal stenting for an abdominal aortic aneurysm by Dr. Eliza in 2015.  He has a history of PAF maintaining normal sinus rhythm on Eliquis  oral anticoagulation.  Fairly active and works out at [employer] 3 days a week for about an hour at a time on a cardio and weights.  Plays golf twice a week.  Fully asymptomatic.  He does complain of some left lower extremity claudication.  Today, he presents with peripheral artery disease and an abdominal aortic aneurysm  for follow-up after a recent ER visit due to a PICC line complication.  He experienced a dislodged PICC line with significant bleeding, which he controlled with pressure. The line was replaced during the ER visit. He is on IV ceftazolin for MSSA, with treatment scheduled to complete by July 31, 2024. An echocardiogram revealed a vegetation.  CT surgery was consulted and treatment with medication was recommended at that time.  He has peripheral artery disease causing left leg pain and is under cardiology care. He also has an abdominal aortic aneurysm and needs a new vascular surgeon.  Current medications include amlodipine , metoprolol , Lipitor, Eliquis , and fish oil . He notes leg swelling that resolves with elevation. He denies chest pain or shortness of breath. He is eager to resume physical activities like gym workouts and golf.  Reports no  shortness of breath nor dyspnea on exertion. Reports no chest pain, pressure, or tightness. No orthopnea, PND. Reports no palpitations.   Discussed the use of AI scribe software for clinical note transcription with the patient, who gave verbal consent to proceed.   ROS: pertinent ROS in HPI  Studies Reviewed     Echo 06/18/24 IMPRESSIONS     1. Left ventricular ejection fraction, by estimation, is 50%. The left  ventricle has mildly decreased function. Left ventricular endocardial  border not optimally defined to evaluate regional wall motion. There is  moderate concentric left ventricular  hypertrophy. Indeterminate diastolic filling due to E-A fusion.   2. Right ventricular systolic function was not well visualized. The right  ventricular size is not well visualized. Tricuspid regurgitation signal is  inadequate for assessing PA pressure.   3. Left atrial size was mild to moderately dilated.   4. The mitral valve is degenerative. No evidence of mitral valve  regurgitation. Mild mitral stenosis. The mean mitral valve gradient is 4.6  mmHg with average heart rate of 99 bpm. Severe mitral annular  calcification.   5. The aortic valve is tricuspid. There is mild calcification of the  aortic valve. Aortic valve regurgitation is not visualized. Aortic valve  sclerosis/calcification is present, without any evidence of aortic  stenosis.   Comparison(s): Prior images reviewed side by side. The left ventricular  function is worsened. Technically poor quality study, even after  administration of Definity  contrast, further challenged due to incessant  ectopy. Suspect there is moderate  hypokinesis  of the basal inferolateral wall.   FINDINGS   Left Ventricle: Left ventricular ejection fraction, by estimation, is  50%. The left ventricle has mildly decreased function. Left ventricular  endocardial border not optimally defined to evaluate regional wall motion.  Definity  contrast agent was  given IV   to delineate the left ventricular endocardial borders. The left  ventricular internal cavity size was normal in size. There is moderate  concentric left ventricular hypertrophy. Indeterminate diastolic filling  due to E-A fusion.   Right Ventricle: The right ventricular size is not well visualized. Right  vetricular wall thickness was not well visualized. Right ventricular  systolic function was not well visualized. Tricuspid regurgitation signal  is inadequate for assessing PA  pressure.   Left Atrium: Left atrial size was mild to moderately dilated.   Right Atrium: Right atrial size was normal in size.   Pericardium: There is no evidence of pericardial effusion.   Mitral Valve: The mitral valve is degenerative in appearance. Severe  mitral annular calcification. No evidence of mitral valve regurgitation.  Mild mitral valve stenosis. The mean mitral valve gradient is 4.6 mmHg  with average heart rate of 99 bpm.   Tricuspid Valve: The tricuspid valve is grossly normal. Tricuspid valve  regurgitation is not demonstrated.   Aortic Valve: The aortic valve is tricuspid. There is mild calcification  of the aortic valve. Aortic valve regurgitation is not visualized. Aortic  valve sclerosis/calcification is present, without any evidence of aortic  stenosis.   Pulmonic Valve: The pulmonic valve was not well visualized. Pulmonic valve  regurgitation is trivial. No evidence of pulmonic stenosis.   Aorta: The aortic root and ascending aorta are structurally normal, with  no evidence of dilitation.   IAS/Shunts: The interatrial septum was not well visualized.      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 Valvular vegetation identified on echocardiogram. - Continue IV ceftazolin until July 31, 2024. - Repeat echocardiogram in October to assess vegetation. -follow up with ID  PICC line complication (dislodgement and bleeding) PICC line dislodged causing bleeding, likely exacerbated by anticoagulation with Eliquis . Bleeding resolved spontaneously. - New PICC line placed in the ER.  Chronic systolic heart failure with mildly reduced ejection fraction Ejection fraction at 50%, slightly below normal. Chronic lower extremity edema likely venous in origin, but heart failure may contribute.  Atherosclerotic heart disease of native coronary artery without angina No recent chest pain or shortness of breath reported.  Abdominal aortic aneurysm Previously followed by Dr. Eliza who has retired. - Refer to Dr. Sheree for vascular surgery follow-up.  Mixed hyperlipidemia with marked hypertriglyceridemia Significant increase in triglycerides from 135 to 336. LDL remains low. Possible lab error considered. - Recheck lipid panel in October with  primary care. - Consider switching to Vascepa or adding fenofibrate  if triglycerides remain elevated.  Essential hypertension Blood pressure well-controlled with current regimen of amlodipine  and metoprolol .  Chronic lower extremity edema (likely venous) Chronic edema likely due to venous insufficiency. Resolves with elevation. Compression stockings not preferred by him.       Dispo: He can return in 3 months for follow-up with MD  Signed, Philip LOISE Fabry, PA-C

## 2024-07-15 NOTE — ED Notes (Signed)
 Went to blue room doc zone to notify provider team of pt ready for DC.

## 2024-07-15 NOTE — Patient Instructions (Addendum)
 Thank you for choosing [facility]!     Medication Instructions:  No medication changes were made during today's visit.  *If you need a refill on your cardiac medications before your next appointment, please call your pharmacy*   Lab Work: REMINDER, the labs that you have done with your primary care provider, make sure we are faxed a copy. Our fax number is listed above. If you have labs (blood work) drawn today and your tests are completely normal, you will receive your results only by: MyChart Message (if you have MyChart) OR A paper copy in the mail If you have any lab test that is abnormal or we need to change your treatment, we will call you to review the results.   Testing/Procedures: Your physician has requested that you have an echocardiogram in October 2025. Echocardiography is a painless test that uses sound waves to create images of your heart. It provides your doctor with information about the size and shape of your heart and how well your heart's chambers and valves are working. This procedure takes approximately one hour. There are no restrictions for this procedure. Please do NOT wear cologne, perfume, aftershave, or lotions (deodorant is allowed). Please arrive 15 minutes prior to your appointment time.  Please note: We ask at that you not bring children with you during ultrasound (echo/ vascular) testing. Due to room size and safety concerns, children are not allowed in the ultrasound rooms during exams. Our front office staff cannot provide observation of children in our lobby area while testing is being conducted. An adult accompanying a patient to their appointment will only be allowed in the ultrasound room at the discretion of the ultrasound technician under special circumstances. We apologize for any inconvenience.   Your next appointment:   2-3 month(s)   Provider:   Dr. Lurena Red    Follow-Up: At [facility], you and your health needs  are our priority.  As part of our continuing mission to provide you with exceptional heart care, we have created designated Provider Care Teams.  These Care Teams include your primary Cardiologist (physician) and Advanced Practice Providers (APPs -  Physician Assistants and Nurse Practitioners) who all work together to provide you with the care you need, when you need it. We recommend signing up for the patient portal called MyChart.  Sign up information is provided on this After Visit Summary.  MyChart is used to connect with patients for Virtual Visits (Telemedicine).  Patients are able to view lab/test results, encounter notes, upcoming appointments, etc.  Non-urgent messages can be sent to your provider as well.   To learn more about what you can do with MyChart, go to [email or web address].

## 2024-07-15 NOTE — Discharge Instructions (Signed)
 Please continue to take your antibiotics and follow-up with your primary doctors

## 2024-07-16 ENCOUNTER — Telehealth: Payer: Self-pay

## 2024-07-16 DIAGNOSIS — I130 Hypertensive heart and chronic kidney disease with heart failure and stage 1 through stage 4 chronic kidney disease, or unspecified chronic kidney disease: Secondary | ICD-10-CM | POA: Diagnosis not present

## 2024-07-16 DIAGNOSIS — I330 Acute and subacute infective endocarditis: Secondary | ICD-10-CM | POA: Diagnosis not present

## 2024-07-16 DIAGNOSIS — C44209 Unspecified malignant neoplasm of skin of left ear and external auricular canal: Secondary | ICD-10-CM | POA: Diagnosis not present

## 2024-07-16 DIAGNOSIS — A4101 Sepsis due to Methicillin susceptible Staphylococcus aureus: Secondary | ICD-10-CM | POA: Diagnosis not present

## 2024-07-16 DIAGNOSIS — I76 Septic arterial embolism: Secondary | ICD-10-CM | POA: Diagnosis not present

## 2024-07-16 DIAGNOSIS — G9341 Metabolic encephalopathy: Secondary | ICD-10-CM | POA: Diagnosis not present

## 2024-07-16 DIAGNOSIS — T8131XD Disruption of external operation (surgical) wound, not elsewhere classified, subsequent encounter: Secondary | ICD-10-CM | POA: Diagnosis not present

## 2024-07-16 DIAGNOSIS — T8149XA Infection following a procedure, other surgical site, initial encounter: Secondary | ICD-10-CM | POA: Diagnosis not present

## 2024-07-16 DIAGNOSIS — I509 Heart failure, unspecified: Secondary | ICD-10-CM | POA: Diagnosis not present

## 2024-07-16 NOTE — Telephone Encounter (Signed)
 Copied from CRM [id number]. Topic: General - Other >> Jul 16, 2024  3:39 PM Berneda FALCON wrote: Reason for CRM: Signe Eastern PT with [facility] would like PCP to know that blood pressure at start of session was 160/80 when he stood up 150/80. No symptoms either time.  Additionally, pt is having some difficulty with bowel movements, Needs suppository to have success. Miralax  daily, is it okay to continue to use suppository?  [phone] (confidential VM).

## 2024-07-16 NOTE — Telephone Encounter (Signed)
Plan of care signed and faxed back to [facility] at [phone]. Form sent for scanning.  

## 2024-07-16 NOTE — Telephone Encounter (Signed)
 LMOM informing Signe of PCP recommendations. Instructed to call if questions/concerns.

## 2024-07-16 NOTE — Telephone Encounter (Signed)
 BP yesterday at cardiology 138/78, recommend to monitor BPs twice a day, let me know if readings are consistently more than 140/85. Agree to continue MiraLAX  daily, okay to use a suppository if needed. If he continues with constipation or develops nausea, stomach pain: Needs to be seen.

## 2024-07-17 DIAGNOSIS — T8131XD Disruption of external operation (surgical) wound, not elsewhere classified, subsequent encounter: Secondary | ICD-10-CM | POA: Diagnosis not present

## 2024-07-17 DIAGNOSIS — I330 Acute and subacute infective endocarditis: Secondary | ICD-10-CM | POA: Diagnosis not present

## 2024-07-17 DIAGNOSIS — I130 Hypertensive heart and chronic kidney disease with heart failure and stage 1 through stage 4 chronic kidney disease, or unspecified chronic kidney disease: Secondary | ICD-10-CM | POA: Diagnosis not present

## 2024-07-17 DIAGNOSIS — A4101 Sepsis due to Methicillin susceptible Staphylococcus aureus: Secondary | ICD-10-CM | POA: Diagnosis not present

## 2024-07-17 DIAGNOSIS — T8149XA Infection following a procedure, other surgical site, initial encounter: Secondary | ICD-10-CM | POA: Diagnosis not present

## 2024-07-17 DIAGNOSIS — G9341 Metabolic encephalopathy: Secondary | ICD-10-CM | POA: Diagnosis not present

## 2024-07-17 DIAGNOSIS — C44209 Unspecified malignant neoplasm of skin of left ear and external auricular canal: Secondary | ICD-10-CM | POA: Diagnosis not present

## 2024-07-17 DIAGNOSIS — I76 Septic arterial embolism: Secondary | ICD-10-CM | POA: Diagnosis not present

## 2024-07-17 DIAGNOSIS — I509 Heart failure, unspecified: Secondary | ICD-10-CM | POA: Diagnosis not present

## 2024-07-18 DIAGNOSIS — T8149XA Infection following a procedure, other surgical site, initial encounter: Secondary | ICD-10-CM | POA: Diagnosis not present

## 2024-07-18 DIAGNOSIS — T8131XD Disruption of external operation (surgical) wound, not elsewhere classified, subsequent encounter: Secondary | ICD-10-CM | POA: Diagnosis not present

## 2024-07-18 DIAGNOSIS — I76 Septic arterial embolism: Secondary | ICD-10-CM | POA: Diagnosis not present

## 2024-07-18 DIAGNOSIS — A4101 Sepsis due to Methicillin susceptible Staphylococcus aureus: Secondary | ICD-10-CM | POA: Diagnosis not present

## 2024-07-18 DIAGNOSIS — I509 Heart failure, unspecified: Secondary | ICD-10-CM | POA: Diagnosis not present

## 2024-07-18 DIAGNOSIS — G9341 Metabolic encephalopathy: Secondary | ICD-10-CM | POA: Diagnosis not present

## 2024-07-18 DIAGNOSIS — C44209 Unspecified malignant neoplasm of skin of left ear and external auricular canal: Secondary | ICD-10-CM | POA: Diagnosis not present

## 2024-07-18 DIAGNOSIS — I130 Hypertensive heart and chronic kidney disease with heart failure and stage 1 through stage 4 chronic kidney disease, or unspecified chronic kidney disease: Secondary | ICD-10-CM | POA: Diagnosis not present

## 2024-07-18 DIAGNOSIS — I330 Acute and subacute infective endocarditis: Secondary | ICD-10-CM | POA: Diagnosis not present

## 2024-07-19 ENCOUNTER — Telehealth: Payer: Self-pay

## 2024-07-19 LAB — CULTURE, BLOOD (SINGLE)
Culture: NO GROWTH
Special Requests: ADEQUATE

## 2024-07-19 NOTE — Transitions of Care (Post Inpatient/ED Visit) (Signed)
 Transition of Care week 4  Visit Note  07/19/2024  Name: Philip [name] MRN: [id number]          DOB: 11-27-1940  Situation: Patient enrolled in [facility] 30-day program. Visit completed with patient by telephone.   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     Initial Transition Care Management Follow-up Telephone Call    Past Medical History:  Diagnosis Date   AAA (abdominal aortic aneurysm) (HCC)    Arthritis    Atrial fibrillation (HCC)    CAD (coronary artery disease)    had a MI , s/p CABG   Cataracts, bilateral    immature   CHF (congestive heart failure) (HCC)    Dysrhythmia    paroximal a fib   GERD (gastroesophageal reflux disease)    takes Omeprazole  daily   History of colon polyps    History of kidney stones    History of shingles    Hyperlipidemia    Hypertension    takes Amlodipine ,Metoprolol ,and Lisinopril  daily   Joint pain    Joint swelling    Myocardial infarction (HCC)    several with last one being 2001(but never knew about any except 2001)   OSA (obstructive sleep apnea)    started CPAP 12/09   Peripheral vascular disease (HCC)    Peyronie's disease    s/p penile implant in 2006   Pneumonia    as a child   Skin cancer    several , one of them was melanoma (sees derm routinely)   Sleep apnea    Tingling    feet occasionally   Tinnitus     Assessment: Unable to complete - patient reports being ready for [facility] Program closure - confirmed he'd gotten the PICC line wet and went to ED 07/14/24 and had new PICC line placement - will wrap with saran wrap moving forward Patient Reported Symptoms:Patient reports had a BM this morning. BP 121/74 and feels he no longer needs TOC program outreaches   There were no vitals filed for this visit.  Medications Reviewed Today     Reviewed by Lauro Shona LABOR, RN (Registered Nurse) on 07/19/24 at 1209  Med  List Status: <None>   Medication Order Taking? Sig Documenting Provider Last Dose Status Informant  amLODipine  (NORVASC ) 10 MG tablet [id number] Yes Take 1 tablet (10 mg total) by mouth at bedtime. Court Dorn PARAS, MD  Active Pharmacy Records, Spouse/Significant Other  Ascorbic Acid (VITAMIN C ER PO) [id number] Yes Take 500 mg by mouth daily. [provider]  Active Pharmacy Records, Spouse/Significant Other  atorvastatin  (LIPITOR) 40 MG tablet [id number] Yes Take 1 tablet (40 mg total) by mouth daily. Court Dorn PARAS, MD  Active Pharmacy Records, Spouse/Significant Other  B Complex-Biotin-FA (B-COMPLEX PO) [id number] Yes Take 1 tablet by mouth daily. [provider]  Active Pharmacy Records, Spouse/Significant Other  ceFAZolin  (ANCEF ) IVPB [id number] Yes Inject 6-8 g into the vein daily. Indication:  MSSA endocarditis First Dose: Yes Last Day of Therapy:  8g CI thru 9/9, then 6g CI thru 10/1 Labs - Once weekly:  CBC/D and BMP, Labs - Once weekly: ESR and CRP Method of administration: continuous infusion - 8g daily as continuous infusion thru 9/9 then 6g daily as a continuous infusion thru 10/1 Method of administration may be changed at the discretion of home infusion pharmacist based upon assessment of the patient and/or caregiver's ability to  self-administer the medication ordered. Overton Faith T, MD  Active   co-enzyme Q-10 30 MG capsule [id number] Yes Take 30 mg by mouth daily. [provider]  Active Pharmacy Records, Spouse/Significant Other  ELIQUIS  5 MG TABS tablet [id number] Yes TAKE 1 TABLET TWICE DAILY Wyn Ernest H Jr., NP  Active Pharmacy Records, Spouse/Significant Other  magnesium  oxide (MAG-OX) 400 MG tablet [id number] Yes Take 800 mg by mouth daily. [provider]  Active Pharmacy Records, Spouse/Significant Other  metoprolol  tartrate (LOPRESSOR ) 50 MG tablet [id number] Yes Take 1 tablet (50 mg total) by mouth 2 (two) times daily. Drusilla Sabas RAMAN, MD   Active   Multiple Vitamins-Minerals (MULTIVITAMIN WITH MINERALS) tablet [id number] Yes Take 1 tablet by mouth daily. [provider]  Active Pharmacy Records, Spouse/Significant Other  Omega-3 Fatty Acids (OMEGA-3 FISH OIL ) 1200 MG CAPS [id number] Yes Take 2 capsules (2,400 mg total) by mouth daily. Take 2 1,200 mg capsules daily Eckard, Tammy, RPH-CPP  Active Pharmacy Records, Spouse/Significant Other  polyethylene glycol (MIRALAX  / GLYCOLAX ) packet [id number] Yes Take 17 g by mouth daily. [provider]  Active Pharmacy Records, Spouse/Significant Other            Recommendation:   Continue to follow with your providers  Follow Up Plan:   Closing From:  Transitions of Care Program  Shona Prow RN, CCM [facility]  VBCI-Population Health RN Care Manager [phone]

## 2024-07-20 ENCOUNTER — Other Ambulatory Visit: Payer: Self-pay | Admitting: Nurse Practitioner

## 2024-07-20 ENCOUNTER — Other Ambulatory Visit: Payer: Self-pay | Admitting: Interventional Cardiology

## 2024-07-20 DIAGNOSIS — B9561 Methicillin susceptible Staphylococcus aureus infection as the cause of diseases classified elsewhere: Secondary | ICD-10-CM | POA: Diagnosis not present

## 2024-07-20 DIAGNOSIS — R0609 Other forms of dyspnea: Secondary | ICD-10-CM

## 2024-07-20 DIAGNOSIS — R5383 Other fatigue: Secondary | ICD-10-CM

## 2024-07-20 DIAGNOSIS — I2510 Atherosclerotic heart disease of native coronary artery without angina pectoris: Secondary | ICD-10-CM

## 2024-07-20 DIAGNOSIS — R7881 Bacteremia: Secondary | ICD-10-CM | POA: Diagnosis not present

## 2024-07-20 DIAGNOSIS — T8149XA Infection following a procedure, other surgical site, initial encounter: Secondary | ICD-10-CM | POA: Diagnosis not present

## 2024-07-21 ENCOUNTER — Inpatient Hospital Stay (HOSPITAL_BASED_OUTPATIENT_CLINIC_OR_DEPARTMENT_OTHER)
Admission: EM | Admit: 2024-07-21 | Discharge: 2024-07-28 | DRG: 288 | Disposition: A | Discharge status: Hospice | Source: Ambulatory Visit | Attending: Internal Medicine | Admitting: Internal Medicine

## 2024-07-21 ENCOUNTER — Emergency Department (HOSPITAL_BASED_OUTPATIENT_CLINIC_OR_DEPARTMENT_OTHER)

## 2024-07-21 ENCOUNTER — Other Ambulatory Visit: Payer: Self-pay

## 2024-07-21 ENCOUNTER — Encounter (HOSPITAL_BASED_OUTPATIENT_CLINIC_OR_DEPARTMENT_OTHER): Payer: Self-pay | Admitting: Emergency Medicine

## 2024-07-21 DIAGNOSIS — I4891 Unspecified atrial fibrillation: Secondary | ICD-10-CM | POA: Diagnosis not present

## 2024-07-21 DIAGNOSIS — K219 Gastro-esophageal reflux disease without esophagitis: Secondary | ICD-10-CM | POA: Diagnosis present

## 2024-07-21 DIAGNOSIS — Z66 Do not resuscitate: Secondary | ICD-10-CM | POA: Diagnosis not present

## 2024-07-21 DIAGNOSIS — I2510 Atherosclerotic heart disease of native coronary artery without angina pectoris: Secondary | ICD-10-CM | POA: Diagnosis present

## 2024-07-21 DIAGNOSIS — Z8249 Family history of ischemic heart disease and other diseases of the circulatory system: Secondary | ICD-10-CM

## 2024-07-21 DIAGNOSIS — R0602 Shortness of breath: Secondary | ICD-10-CM | POA: Diagnosis not present

## 2024-07-21 DIAGNOSIS — I483 Typical atrial flutter: Secondary | ICD-10-CM | POA: Diagnosis not present

## 2024-07-21 DIAGNOSIS — Z6833 Body mass index (BMI) 33.0-33.9, adult: Secondary | ICD-10-CM

## 2024-07-21 DIAGNOSIS — Z85820 Personal history of malignant melanoma of skin: Secondary | ICD-10-CM

## 2024-07-21 DIAGNOSIS — I330 Acute and subacute infective endocarditis: Principal | ICD-10-CM | POA: Diagnosis present

## 2024-07-21 DIAGNOSIS — E876 Hypokalemia: Secondary | ICD-10-CM | POA: Diagnosis present

## 2024-07-21 DIAGNOSIS — N1831 Chronic kidney disease, stage 3a: Secondary | ICD-10-CM | POA: Diagnosis present

## 2024-07-21 DIAGNOSIS — I252 Old myocardial infarction: Secondary | ICD-10-CM

## 2024-07-21 DIAGNOSIS — E66811 Obesity, class 1: Secondary | ICD-10-CM | POA: Diagnosis not present

## 2024-07-21 DIAGNOSIS — D696 Thrombocytopenia, unspecified: Secondary | ICD-10-CM | POA: Diagnosis present

## 2024-07-21 DIAGNOSIS — I3481 Nonrheumatic mitral (valve) annulus calcification: Secondary | ICD-10-CM | POA: Diagnosis present

## 2024-07-21 DIAGNOSIS — Z87891 Personal history of nicotine dependence: Secondary | ICD-10-CM

## 2024-07-21 DIAGNOSIS — R946 Abnormal results of thyroid function studies: Secondary | ICD-10-CM | POA: Diagnosis present

## 2024-07-21 DIAGNOSIS — I361 Nonrheumatic tricuspid (valve) insufficiency: Secondary | ICD-10-CM | POA: Diagnosis not present

## 2024-07-21 DIAGNOSIS — Z1152 Encounter for screening for COVID-19: Secondary | ICD-10-CM

## 2024-07-21 DIAGNOSIS — I110 Hypertensive heart disease with heart failure: Secondary | ICD-10-CM | POA: Diagnosis not present

## 2024-07-21 DIAGNOSIS — I7143 Infrarenal abdominal aortic aneurysm, without rupture: Secondary | ICD-10-CM | POA: Diagnosis not present

## 2024-07-21 DIAGNOSIS — Z85828 Personal history of other malignant neoplasm of skin: Secondary | ICD-10-CM

## 2024-07-21 DIAGNOSIS — Z7901 Long term (current) use of anticoagulants: Secondary | ICD-10-CM

## 2024-07-21 DIAGNOSIS — Z23 Encounter for immunization: Secondary | ICD-10-CM | POA: Diagnosis not present

## 2024-07-21 DIAGNOSIS — G4733 Obstructive sleep apnea (adult) (pediatric): Secondary | ICD-10-CM | POA: Diagnosis present

## 2024-07-21 DIAGNOSIS — Z7984 Long term (current) use of oral hypoglycemic drugs: Secondary | ICD-10-CM

## 2024-07-21 DIAGNOSIS — I340 Nonrheumatic mitral (valve) insufficiency: Secondary | ICD-10-CM | POA: Diagnosis present

## 2024-07-21 DIAGNOSIS — J984 Other disorders of lung: Secondary | ICD-10-CM | POA: Diagnosis not present

## 2024-07-21 DIAGNOSIS — B9561 Methicillin susceptible Staphylococcus aureus infection as the cause of diseases classified elsewhere: Secondary | ICD-10-CM | POA: Diagnosis not present

## 2024-07-21 DIAGNOSIS — I5033 Acute on chronic diastolic (congestive) heart failure: Secondary | ICD-10-CM | POA: Diagnosis present

## 2024-07-21 DIAGNOSIS — R7881 Bacteremia: Secondary | ICD-10-CM | POA: Diagnosis not present

## 2024-07-21 DIAGNOSIS — I052 Rheumatic mitral stenosis with insufficiency: Secondary | ICD-10-CM | POA: Diagnosis not present

## 2024-07-21 DIAGNOSIS — R0902 Hypoxemia: Secondary | ICD-10-CM

## 2024-07-21 DIAGNOSIS — J9601 Acute respiratory failure with hypoxia: Secondary | ICD-10-CM | POA: Diagnosis present

## 2024-07-21 DIAGNOSIS — I4892 Unspecified atrial flutter: Secondary | ICD-10-CM | POA: Diagnosis not present

## 2024-07-21 DIAGNOSIS — Z951 Presence of aortocoronary bypass graft: Secondary | ICD-10-CM | POA: Diagnosis not present

## 2024-07-21 DIAGNOSIS — Z452 Encounter for adjustment and management of vascular access device: Secondary | ICD-10-CM | POA: Diagnosis not present

## 2024-07-21 DIAGNOSIS — Z7189 Other specified counseling: Secondary | ICD-10-CM | POA: Diagnosis not present

## 2024-07-21 DIAGNOSIS — I714 Abdominal aortic aneurysm, without rupture, unspecified: Secondary | ICD-10-CM | POA: Diagnosis present

## 2024-07-21 DIAGNOSIS — E785 Hyperlipidemia, unspecified: Secondary | ICD-10-CM | POA: Diagnosis not present

## 2024-07-21 DIAGNOSIS — I480 Paroxysmal atrial fibrillation: Secondary | ICD-10-CM | POA: Diagnosis not present

## 2024-07-21 DIAGNOSIS — I130 Hypertensive heart and chronic kidney disease with heart failure and stage 1 through stage 4 chronic kidney disease, or unspecified chronic kidney disease: Secondary | ICD-10-CM | POA: Diagnosis not present

## 2024-07-21 DIAGNOSIS — I339 Acute and subacute endocarditis, unspecified: Secondary | ICD-10-CM | POA: Diagnosis not present

## 2024-07-21 DIAGNOSIS — Z515 Encounter for palliative care: Secondary | ICD-10-CM | POA: Diagnosis not present

## 2024-07-21 DIAGNOSIS — I10 Essential (primary) hypertension: Secondary | ICD-10-CM | POA: Diagnosis present

## 2024-07-21 DIAGNOSIS — Z79899 Other long term (current) drug therapy: Secondary | ICD-10-CM

## 2024-07-21 DIAGNOSIS — I509 Heart failure, unspecified: Secondary | ICD-10-CM | POA: Diagnosis not present

## 2024-07-21 DIAGNOSIS — J810 Acute pulmonary edema: Secondary | ICD-10-CM | POA: Diagnosis not present

## 2024-07-21 DIAGNOSIS — Z95820 Peripheral vascular angioplasty status with implants and grafts: Secondary | ICD-10-CM | POA: Diagnosis not present

## 2024-07-21 DIAGNOSIS — I739 Peripheral vascular disease, unspecified: Secondary | ICD-10-CM | POA: Diagnosis present

## 2024-07-21 DIAGNOSIS — I5031 Acute diastolic (congestive) heart failure: Secondary | ICD-10-CM | POA: Diagnosis not present

## 2024-07-21 DIAGNOSIS — T8149XA Infection following a procedure, other surgical site, initial encounter: Secondary | ICD-10-CM | POA: Diagnosis not present

## 2024-07-21 LAB — CBC WITH DIFFERENTIAL/PLATELET
Abs Immature Granulocytes: 0.06 K/uL (ref 0.00–0.07)
Basophils Absolute: 0 K/uL (ref 0.0–0.1)
Basophils Relative: 0 %
Eosinophils Absolute: 0.1 K/uL (ref 0.0–0.5)
Eosinophils Relative: 1 %
HCT: 34.9 % — ABNORMAL LOW (ref 39.0–52.0)
Hemoglobin: 12 g/dL — ABNORMAL LOW (ref 13.0–17.0)
Immature Granulocytes: 1 %
Lymphocytes Relative: 17 %
Lymphs Abs: 1.3 K/uL (ref 0.7–4.0)
MCH: 32.6 pg (ref 26.0–34.0)
MCHC: 34.4 g/dL (ref 30.0–36.0)
MCV: 94.8 fL (ref 80.0–100.0)
Monocytes Absolute: 0.7 K/uL (ref 0.1–1.0)
Monocytes Relative: 10 %
Neutro Abs: 5.3 K/uL (ref 1.7–7.7)
Neutrophils Relative %: 71 %
Platelets: 76 K/uL — ABNORMAL LOW (ref 150–400)
RBC: 3.68 MIL/uL — ABNORMAL LOW (ref 4.22–5.81)
RDW: 13.6 % (ref 11.5–15.5)
WBC: 7.5 K/uL (ref 4.0–10.5)
nRBC: 0 % (ref 0.0–0.2)

## 2024-07-21 LAB — BASIC METABOLIC PANEL WITH GFR
Anion gap: 13 (ref 5–15)
BUN: 21 mg/dL (ref 8–23)
CO2: 23 mmol/L (ref 22–32)
Calcium: 9.3 mg/dL (ref 8.9–10.3)
Chloride: 103 mmol/L (ref 98–111)
Creatinine, Ser: 1.44 mg/dL — ABNORMAL HIGH (ref 0.61–1.24)
GFR, Estimated: 48 mL/min — ABNORMAL LOW (ref >60)
Glucose, Bld: 103 mg/dL — ABNORMAL HIGH (ref 70–99)
Potassium: 4.2 mmol/L (ref 3.5–5.1)
Sodium: 138 mmol/L (ref 135–145)

## 2024-07-21 LAB — RESP PANEL BY RT-PCR (RSV, FLU A&B, COVID)  RVPGX2
Influenza A by PCR: NEGATIVE
Influenza B by PCR: NEGATIVE
Resp Syncytial Virus by PCR: NEGATIVE
SARS Coronavirus 2 by RT PCR: NEGATIVE

## 2024-07-21 LAB — PRO BRAIN NATRIURETIC PEPTIDE: Pro Brain Natriuretic Peptide: 2281 pg/mL — ABNORMAL HIGH (ref <300.0)

## 2024-07-21 MED ORDER — APIXABAN 5 MG PO TABS
5.0000 mg | ORAL_TABLET | Freq: Two times a day (BID) | ORAL | Status: DC
  Administered 2024-07-21 – 2024-07-25 (×9): 5 mg via ORAL
  Filled 2024-07-21 (×9): qty 1

## 2024-07-21 MED ORDER — SODIUM CHLORIDE 0.9% FLUSH
3.0000 mL | Freq: Two times a day (BID) | INTRAVENOUS | Status: DC
  Administered 2024-07-21 – 2024-07-27 (×9): 3 mL via INTRAVENOUS

## 2024-07-21 MED ORDER — INFLUENZA VAC SPLIT HIGH-DOSE 0.5 ML IM SUSY
0.5000 mL | PREFILLED_SYRINGE | INTRAMUSCULAR | Status: DC
  Filled 2024-07-21: qty 0.5

## 2024-07-21 MED ORDER — ACETAMINOPHEN 650 MG RE SUPP: 650.0000 mg | Freq: Four times a day (QID) | RECTAL | Status: DC | PRN

## 2024-07-21 MED ORDER — FUROSEMIDE 10 MG/ML IJ SOLN
80.0000 mg | Freq: Two times a day (BID) | INTRAMUSCULAR | Status: DC
  Filled 2024-07-21: qty 8

## 2024-07-21 MED ORDER — ATORVASTATIN CALCIUM 40 MG PO TABS
40.0000 mg | ORAL_TABLET | Freq: Every day | ORAL | Status: DC
  Administered 2024-07-21 – 2024-07-28 (×8): 40 mg via ORAL
  Filled 2024-07-21 (×8): qty 1

## 2024-07-21 MED ORDER — FUROSEMIDE 10 MG/ML IJ SOLN
40.0000 mg | Freq: Once | INTRAMUSCULAR | Status: AC
  Administered 2024-07-21: 40 mg via INTRAVENOUS
  Filled 2024-07-21: qty 4

## 2024-07-21 MED ORDER — CEFAZOLIN SODIUM-DEXTROSE 2-4 GM/100ML-% IV SOLN
2.0000 g | Freq: Three times a day (TID) | INTRAVENOUS | Status: DC
Start: 2024-07-21 — End: 2024-07-28
  Administered 2024-07-21 – 2024-07-28 (×20): 2 g via INTRAVENOUS
  Filled 2024-07-21 (×19): qty 100

## 2024-07-21 MED ORDER — IPRATROPIUM-ALBUTEROL 0.5-2.5 (3) MG/3ML IN SOLN
3.0000 mL | Freq: Once | RESPIRATORY_TRACT | Status: AC
Start: 2024-07-21 — End: 2024-07-21
  Administered 2024-07-21: 3 mL via RESPIRATORY_TRACT
  Filled 2024-07-21: qty 3

## 2024-07-21 MED ORDER — ACETAMINOPHEN 325 MG PO TABS: 650.0000 mg | ORAL_TABLET | Freq: Four times a day (QID) | ORAL | Status: DC | PRN

## 2024-07-21 MED ORDER — HYDRALAZINE HCL 20 MG/ML IJ SOLN
5.0000 mg | INTRAMUSCULAR | Status: DC | PRN
  Administered 2024-07-22: 5 mg via INTRAVENOUS
  Filled 2024-07-21: qty 1

## 2024-07-21 MED ORDER — POLYETHYLENE GLYCOL 3350 17 G PO PACK: 17.0000 g | PACK | Freq: Every day | ORAL | Status: DC | PRN

## 2024-07-21 MED ORDER — MAGNESIUM OXIDE -MG SUPPLEMENT 400 (240 MG) MG PO TABS
800.0000 mg | ORAL_TABLET | Freq: Every day | ORAL | Status: DC
  Administered 2024-07-21 – 2024-07-28 (×8): 800 mg via ORAL
  Filled 2024-07-21 (×10): qty 2

## 2024-07-21 MED ORDER — BISACODYL 5 MG PO TBEC
5.0000 mg | DELAYED_RELEASE_TABLET | Freq: Every day | ORAL | Status: DC | PRN
  Administered 2024-07-24: 5 mg via ORAL
  Filled 2024-07-21: qty 1

## 2024-07-21 MED ORDER — METOPROLOL TARTRATE 25 MG PO TABS
25.0000 mg | ORAL_TABLET | Freq: Two times a day (BID) | ORAL | Status: DC
  Administered 2024-07-21: 25 mg via ORAL
  Filled 2024-07-21: qty 1

## 2024-07-21 NOTE — H&P (Signed)
 History and Physical    Patient: Philip [name] FMW:[id number] DOB: 09-11-1941 DOA: 07/21/2024 DOS: the patient was seen and examined on 07/21/2024 . PCP: [name], MD  Patient coming from: [facility]. Chief complaint: Chief Complaint  Patient presents with   Shortness of Breath   HPI:  Philip [name] is a 83 y.o. male with past medical history  of essential hypertension, heart disease, infective endocarditis of the mitral valve, abdominal aortic aneurysm, CKD stage IIIa, coming to us  from med [facility] where he was seen for shortness of breath cough congestion.  At bedside patient denies any chest pain palpitation nausea vomiting or any other symptoms.  Patient states he developed acute shortness of breath since last night, he was sitting upright and did not have any relief and he remained standing which offered some relief and then he decided to come to the hospital.  Chart review shows that patient was admitted in August where patient was admitted for fever and confusion brought by EMS found to have a fever of 101 was hypoxic at that time patient had a received a recent skin grafting surgery 3 days prior and had been taking doxycycline.  Blood cultures were found to be positive and patient was admitted for rule out sepsis.  Patient continued to have fever infectious disease was on board managing with IV antibiotics and a TEE was done on 06/24/2024 which showed large, mobile vegetation measuring 1. 9 x 1. 2cm attached to the atrial aspect of the mitral valve, likely adherent to a shelf of mitral annular calcification on the posterior annulus, adjacent to A3/ P3 scallop. Evidence of small leaflet perforation at site of vegetation with trivial to mild additional jet MR at site of perforation. In total, MR appears mild- moderate. The mitral valve is degenerative. Mild to moderate mitral valve regurgitation. Mild mitral stenosis. The mean mitral valve gradient is 6. 0 mmHg with average heart rate of  82 bpm. Moderate to severe mitral annular calcification.  ED Course:  Vital signs in the ED were notable for the following:  Vitals:   07/21/24 1400 07/21/24 1445 07/21/24 1531 07/21/24 1637  BP: 125/85   (!) 157/79  Pulse: (!) 104 (!) 103  (!) 115  Temp:   98 F (36.7 C) 98.7 F (37.1 C)  Resp: 19   20  Height:    5' 9 (1.753 m)  Weight:    101.2 kg  SpO2: 91%   99%  TempSrc:   Oral Oral  BMI (Calculated):    32.93  >>ED evaluation thus far shows: Initial EKG shows sinus rhythm at 77 with a PR of 225 and left atrial enlargement and nonspecific intraventricular conduction delay QRS of 118 and QTc of 457. CMP showing glucose of 103 CL creatinine of 1.4 eGFR 48. CBC showing hemoglobin of 12 platelets of 76. proBNP of 2281.  >>While in the ED patient received the following: Patient received DuoNeb treatment, patient started on oxygen and given Lasix . I/O last 3 completed shifts: In: 240 [P.O.:240] Out: 2160 [Urine:400; Blood:1760] No intake/output data recorded.  Review of Systems  Respiratory:  Positive for shortness of breath.   Cardiovascular:  Positive for leg swelling.  All other systems reviewed and are negative.  Past Medical History:  Diagnosis Date   AAA (abdominal aortic aneurysm) (HCC)    Arthritis    Atrial fibrillation (HCC)    CAD (coronary artery disease)    had a MI , s/p CABG  Cataracts, bilateral    immature   CHF (congestive heart failure) (HCC)    Dysrhythmia    paroximal a fib   GERD (gastroesophageal reflux disease)    takes Omeprazole  daily   History of colon polyps    History of kidney stones    History of shingles    Hyperlipidemia    Hypertension    takes Amlodipine ,Metoprolol ,and Lisinopril  daily   Joint pain    Joint swelling    Myocardial infarction [facility])    several with last one being 2001(but never knew about any except 2001)   OSA (obstructive sleep apnea)    started CPAP 12/09   Peripheral vascular disease (HCC)     Peyronie's disease    s/p penile implant in 2006   Pneumonia    as a child   Skin cancer    several , one of them was melanoma (sees derm routinely)   Sleep apnea    Tingling    feet occasionally   Tinnitus    Past Surgical History:  Procedure Laterality Date   ABDOMINAL AORTAGRAM N/A 06/16/2014   Procedure: ABDOMINAL EZELLA;  Surgeon: Lonni GORMAN Blade, MD;  Location: [facility] CATH LAB;  Service: Cardiovascular;  Laterality: N/A;   ABDOMINAL AORTIC ENDOVASCULAR STENT GRAFT N/A 07/08/2014   Procedure: ABDOMINAL AORTIC ENDOVASCULAR STENT GRAFT/ GORE;  Surgeon: Lonni GORMAN Blade, MD;  Location: [facility] OR;  Service: Vascular;  Laterality: N/A;   CARDIAC CATHETERIZATION  10/31/2013   COLONOSCOPY     CORONARY ARTERY BYPASS GRAFT  10/31/1997   x 3 vessels   LEFT HEART CATH AND CORS/GRAFTS ANGIOGRAPHY N/A 10/07/2021   Procedure: LEFT HEART CATH AND CORS/GRAFTS ANGIOGRAPHY;  Surgeon: Verlin Lonni BIRCH, MD;  Location: [facility] INVASIVE CV LAB;  Service: Cardiovascular;  Laterality: N/A;   LEFT HEART CATHETERIZATION WITH CORONARY/GRAFT ANGIOGRAM N/A 05/29/2014   Procedure: LEFT HEART CATHETERIZATION WITH EL BILE;  Surgeon: Ezra GORMAN Shuck, MD;  Location: [facility] CATH LAB;  Service: Cardiovascular;  Laterality: N/A;   PENILE PROSTHESIS IMPLANT  08/31/2005   AMS inflatable penile prosthesis   RHINOPLASTY  10/31/1973   TRANSESOPHAGEAL ECHOCARDIOGRAM (CATH LAB) N/A 06/24/2024   Procedure: TRANSESOPHAGEAL ECHOCARDIOGRAM;  Surgeon: Loni Soyla LABOR, MD;  Location: [facility] INVASIVE CV LAB;  Service: Cardiovascular;  Laterality: N/A;    reports that he quit smoking about 46 years ago. His smoking use included cigarettes. He has never used smokeless tobacco. He reports current alcohol use. He reports that he does not use drugs. No Known Allergies Family History  Problem Relation Age of Onset   Prostate cancer Father 7   Heart disease Father    Skin cancer Father    Heart attack Father     Cancer Father    Deep vein thrombosis Father    Hyperlipidemia Father    Hypertension Father    Heart disease Mother    Skin cancer Mother    Heart attack Mother    Cancer Mother    Hyperlipidemia Mother    Hypertension Mother    Varicose Veins Mother    Peripheral vascular disease Mother        amputation   Sleep apnea Brother    Hyperlipidemia Brother    Hypertension Brother    Prostate cancer Brother 13   Colon cancer Other        aunt   Skin cancer Brother    Skin cancer Sister    Cancer Sister    Heart disease Sister    Diabetes Neg  Hx    Stroke Neg Hx    Prior to Admission medications   Medication Sig Start Date End Date Taking? Authorizing Provider  amLODipine  (NORVASC ) 10 MG tablet Take 1 tablet (10 mg total) by mouth at bedtime. 06/10/24   Court Dorn PARAS, MD  Ascorbic Acid (VITAMIN C ER PO) Take 500 mg by mouth daily.    [provider]  atorvastatin  (LIPITOR) 40 MG tablet Take 1 tablet (40 mg total) by mouth daily. 06/10/24   Court Dorn PARAS, MD  B Complex-Biotin-FA (B-COMPLEX PO) Take 1 tablet by mouth daily.    [provider]  ceFAZolin  (ANCEF ) IVPB Inject 6-8 g into the vein daily. Indication:  MSSA endocarditis First Dose: Yes Last Day of Therapy:  8g CI thru 9/9, then 6g CI thru 10/1 Labs - Once weekly:  CBC/D and BMP, Labs - Once weekly: ESR and CRP Method of administration: continuous infusion - 8g daily as continuous infusion thru 9/9 then 6g daily as a continuous infusion thru 10/1 Method of administration may be changed at the discretion of home infusion pharmacist based upon assessment of the patient and/or caregiver's ability to self-administer the medication ordered. 06/26/24 07/31/24  Vu, Constance T, MD  co-enzyme Q-10 30 MG capsule Take 30 mg by mouth daily.    [provider]  ELIQUIS  5 MG TABS tablet TAKE 1 TABLET TWICE DAILY 02/07/24   Dick, Ernest H Jr., NP  magnesium  oxide (MAG-OX) 400 MG tablet Take 800 mg by mouth  daily.    [provider]  metoprolol  tartrate (LOPRESSOR ) 50 MG tablet Take 1 tablet (50 mg total) by mouth 2 (two) times daily. 06/27/24   Drusilla Sabas RAMAN, MD  Multiple Vitamins-Minerals (MULTIVITAMIN WITH MINERALS) tablet Take 1 tablet by mouth daily.    [provider]  Omega-3 Fatty Acids (OMEGA-3 FISH OIL ) 1200 MG CAPS Take 2 capsules (2,400 mg total) by mouth daily. Take 2 1,200 mg capsules daily 11/11/21   Eckard, Tammy, RPH-CPP  polyethylene glycol (MIRALAX  / GLYCOLAX ) packet Take 17 g by mouth daily.    [provider]                                                                                 Vitals:   07/21/24 1400 07/21/24 1445 07/21/24 1531 07/21/24 1637  BP: 125/85   (!) 157/79  Pulse: (!) 104 (!) 103  (!) 115  Resp: 19   20  Temp:   98 F (36.7 C) 98.7 F (37.1 C)  TempSrc:   Oral Oral  SpO2: 91%   99%  Weight:    101.2 kg  Height:    5' 9 (1.753 m)   Physical Exam Vitals reviewed.  Constitutional:      General: He is not in acute distress.    Appearance: He is not ill-appearing.  HENT:     Head: Normocephalic and atraumatic.  Eyes:     Extraocular Movements: Extraocular movements intact.  Cardiovascular:     Rate and Rhythm: Normal rate and regular rhythm.     Heart sounds: Murmur heard.     Systolic murmur is present with a grade of 3/6.  Pulmonary:  Breath sounds: Normal breath sounds.  Abdominal:     General: There is no distension.     Palpations: Abdomen is soft.     Tenderness: There is no abdominal tenderness.  Musculoskeletal:     Right lower leg: 2+ Edema present.     Left lower leg: 2+ Edema present.  Neurological:     General: No focal deficit present.     Mental Status: He is alert and oriented to person, place, and time.     Labs on Admission: I have personally reviewed following labs and imaging studies CBC: Recent Labs  Lab 07/21/24 1106  WBC 7.5  NEUTROABS 5.3  HGB 12.0*  HCT 34.9*  MCV 94.8  PLT  76*   Basic Metabolic Panel: Recent Labs  Lab 07/21/24 1106  NA 138  K 4.2  CL 103  CO2 23  GLUCOSE 103*  BUN 21  CREATININE 1.44*  CALCIUM  9.3   GFR: Estimated Creatinine Clearance: 44.8 mL/min (A) (by C-G formula based on SCr of 1.44 mg/dL (H)). Liver Function Tests: No results for input(s): AST, ALT, ALKPHOS, BILITOT, PROT, ALBUMIN in the last 168 hours.  No results for input(s): LIPASE, AMYLASE in the last 168 hours. No results for input(s): AMMONIA in the last 168 hours. Recent Labs    06/18/24 0557 06/19/24 0500 06/20/24 0322 06/21/24 0237 06/22/24 0312 06/24/24 0251 06/25/24 0329 06/26/24 0250 07/14/24 1932 07/21/24 1106  BUN 38* 34* 32* 26* 23 19 17 18 15 21   CREATININE 1.93* 1.45* 1.46* 1.27* 1.17 1.32* 1.17 1.32* 1.18 1.44*    Cardiac Enzymes: No results for input(s): CKTOTAL, CKMB, CKMBINDEX, TROPONINI in the last 168 hours. BNP (last 3 results) Recent Labs    07/21/24 1106  PROBNP 2,281.0*   HbA1C: No results for input(s): HGBA1C in the last 72 hours. CBG: No results for input(s): GLUCAP in the last 168 hours. Lipid Profile: No results for input(s): CHOL, HDL, LDLCALC, TRIG, CHOLHDL, LDLDIRECT in the last 72 hours. Thyroid  Function Tests: No results for input(s): TSH, T4TOTAL, FREET4, T3FREE, THYROIDAB in the last 72 hours. Anemia Panel: No results for input(s): VITAMINB12, FOLATE, FERRITIN, TIBC, IRON, RETICCTPCT in the last 72 hours. Urine analysis:    Component Value Date/Time   COLORURINE YELLOW 10/06/2021 1523   APPEARANCEUR CLEAR 10/06/2021 1523   LABSPEC 1.010 10/06/2021 1523   PHURINE 7.0 10/06/2021 1523   GLUCOSEU NEGATIVE 10/06/2021 1523   GLUCOSEU NEGATIVE 04/24/2020 0847   HGBUR NEGATIVE 10/06/2021 1523   HGBUR negative 11/10/2008 1322   BILIRUBINUR NEGATIVE 10/06/2021 1523   KETONESUR NEGATIVE 10/06/2021 1523   PROTEINUR NEGATIVE 10/06/2021 1523   UROBILINOGEN  0.2 04/24/2020 0847   NITRITE NEGATIVE 10/06/2021 1523   LEUKOCYTESUR NEGATIVE 10/06/2021 1523   Radiological Exams on Admission: DG Chest Portable 1 View Result Date: 07/21/2024 CLINICAL DATA:  Shortness of breath. EXAM: PORTABLE CHEST 1 VIEW COMPARISON:  06/19/2024 FINDINGS: Cardiopericardial silhouette is at upper limits of normal for size. Diffuse interstitial and basilar airspace disease suggests edema. No substantial pleural effusion. Right PICC line tip overlies the right innominate vein. Telemetry leads overlie the chest. IMPRESSION: Diffuse interstitial and basilar airspace disease suggests edema. Electronically Signed   By: Camellia Candle M.D.   On: 07/21/2024 11:49   Data Reviewed: Relevant notes from primary care and specialist visits, past discharge summaries as available in EHR, including Care Everywhere . Prior diagnostic testing as pertinent to current admission diagnoses, Updated medications and problem lists for reconciliation .ED course, including vitals, labs,  imaging, treatment and response to treatment,Triage notes, nursing and pharmacy notes and ED provider's notes.Notable results as noted in HPI.Discussed case with EDMD/ ED APP/ or Specialty MD on call and as needed.  Assessment & Plan  Patient coming from med [facility] for acute onset of shortness of breath with hypoxia on 6 L nasal cannula.  At bedside patient is in no distress states he is feeling much better since receiving his Lasix  at the other hospital.  >> Shortness of breath/acute hypoxic respiratory failure/acute on chronic congestive heart failure: Admit to progressive unit with continuous cardiac monitoring strict I's and O's Daily weights continuous pulse oximetry. Diuretic therapy with monitoring of electrolytes and kidney function. EKG as needed for chest pain tachycardia-bradycardia shortness of breath. Patient's most recent echo in August shows EF of 50% and mitral valve vegetation and I have requested  cardiology consult for repeat TEE.   >>Mitral valve endocarditis: Pt is on iv abx and per ID note. Plan of care was for 6 weeks of IV antibiotics with 2 weeks of cefazolin  2 g IV Q 6 through 9/9 and transition to 2 g cefazolin  IV Q8 through 07/31/2024 - ordered.  Will defer to patient's PCP for repeat blood cultures once antibiotic therapy has been completed.  >> History of CAD: Cont home Lipitor and eliquis .  >> Essential hypertension: Metoprolol  dose decreased to 25 twice daily to allow room for diuresis.   >> Atrial fibrillation: ED EKG was sinus physical exam was sinus rhythm cardiology note mentions irregularly irregular suspect patient may be going in and out of A-fib will monitor overnight on telemetry and continue patient on metoprolol  and Eliquis .   >> AKI on CKD stage IIIa: Lab Results  Component Value Date   CREATININE 1.44 (H) 07/21/2024   CREATININE 1.18 07/14/2024   CREATININE 1.32 (H) 06/26/2024  Renally dose needed medications and avoid contrast.  Premedicate as indicated.  >> Obesity with a BMI of 32.95/elevated glucose: Will add A1c to identify underlying diabetes or prediabetes in light of his systemic infection.   DVT prophylaxis:  Eliquis  Consults:  Cardiology  Advance Care Planning:    Code Status: Full Code   Family Communication:  None . Disposition Plan:  Home Severity of Illness: The appropriate patient status for this patient is INPATIENT. Inpatient status is judged to be reasonable and necessary in order to provide the required intensity of service to ensure the patient's safety. The patient's presenting symptoms, physical exam findings, and initial radiographic and laboratory data in the context of their chronic comorbidities is felt to place them at high risk for further clinical deterioration. Furthermore, it is not anticipated that the patient will be medically stable for discharge from the hospital within 2 midnights of admission.   * I  certify that at the point of admission it is my clinical judgment that the patient will require inpatient hospital care spanning beyond 2 midnights from the point of admission due to high intensity of service, high risk for further deterioration and high frequency of surveillance required.*  Unresulted Labs (From admission, onward)     Start     Ordered   07/22/24 0500  CBC with Differential/Platelet  Tomorrow morning,   R        07/21/24 1939   07/22/24 0500  Comprehensive metabolic panel with GFR  Tomorrow morning,   R        07/21/24 1939   07/22/24 0500  Magnesium   Tomorrow morning,   R  07/21/24 1939   07/22/24 0500  Phosphorus  Tomorrow morning,   R        07/21/24 1939            Meds ordered this encounter  Medications   ipratropium-albuterol  (DUONEB) 0.5-2.5 (3) MG/3ML nebulizer solution 3 mL   furosemide  (LASIX ) injection 40 mg   apixaban  (ELIQUIS ) tablet 5 mg   furosemide  (LASIX ) injection 80 mg   metoprolol  tartrate (LOPRESSOR ) tablet 25 mg   ceFAZolin  (ANCEF ) IVPB 2g/100 mL premix    Antibiotic Indication::   Other Indication (list below)    Other Indication::   IE.   magnesium  oxide (MAG-OX) tablet 800 mg   atorvastatin  (LIPITOR) tablet 40 mg   sodium chloride  flush (NS) 0.9 % injection 3 mL   OR Linked Order Group    acetaminophen  (TYLENOL ) tablet 650 mg    acetaminophen  (TYLENOL ) suppository 650 mg   polyethylene glycol (MIRALAX  / GLYCOLAX ) packet 17 g   bisacodyl  (DULCOLAX) EC tablet 5 mg   hydrALAZINE  (APRESOLINE ) injection 5 mg     Orders Placed This Encounter  Procedures   Resp panel by RT-PCR (RSV, Flu A&B, Covid) Anterior Nasal Swab   DG Chest Portable 1 View   Basic metabolic panel   CBC with Differential   Brain natriuretic peptide   CBC with Differential/Platelet   Comprehensive metabolic panel with GFR   Magnesium    Phosphorus   Diet 2 gram sodium Room service appropriate? Yes; Fluid consistency: Thin   Notify physician (specify)    Initiate Heart Failure Care Plan   Daily weights   Strict intake and output   In and Out Cath   Patient Education:   Apply Heart Failure Care Plan   [facility] and [facility] only) Obtain REDS clips reading Every morning   Patient has an active order for admit to inpatient/place in observation   Maintain IV access   Vital signs   Notify physician (specify)   Mobility Protocol: No Restrictions   Refer to Sidebar Report Mobility Protocol for Adult Inpatient   Initiate Adult Central Line Maintenance and Catheter Protocol for patients with central line (CVC, PICC, Port, Hemodialysis, Trialysis)   If patient diabetic or glucose greater than 140 notify physician for Sliding Scale Insulin  Orders   Initiate CHG Protocol   Do not place and if present remove PureWick   Initiate Oral Care Protocol   Initiate Carrier Fluid Protocol   RN may order General Admission PRN Orders utilizing General Admission PRN medications (through manage orders) for the following patient needs: allergy symptoms (Claritin ), cold sores (Carmex), cough (Robitussin DM), eye irritation (Liquifilm Tears), hemorrhoids (Tucks), indigestion (Maalox), minor skin irritation (Hydrocortisone Cream), muscle pain (Ben Gay), nose irritation (saline nasal spray) and sore throat (Chloraseptic spray).   Cardiac Monitoring - Continuous Indefinite   Full code   Consult to hospitalist  CHF exacerbation, hypoxia   Inpatient consult to Cardiology Consult Timeframe: ROUTINE - requires response within 24 hours; Reason for Consult? CHF Already called   Consult to Transition of Care Team   Consult to Heart Failure Navigation Team [facility], [facility], and [facility])   Nutritional services consult   OT eval and treat   PT eval and treat   ED Pulse oximetry, continuous   Pulse oximetry check with vital signs   Oxygen therapy Mode or (Route): Nasal cannula; Liters Per Minute: 2; Keep O2 saturation between: greater than 92 %   Incentive spirometry   EKG 12-Lead   ED EKG  EKG   EKG   EKG 12-Lead   Insert peripheral IV   Insert peripheral IV   Admit to Inpatient (patient's expected length of stay will be greater than 2 midnights or inpatient only procedure)    Author: Mario LULLA Blanch, MD 12 pm- 8 pm. [facility]. 07/21/2024 7:42 PM Please note for any communication after hours contact [facility] Assigned provider on call on Amion.

## 2024-07-21 NOTE — ED Provider Notes (Signed)
 [facility] Provider Note   CSN: [id number] Arrival date & time: 07/21/24  1048     Patient presents with: Shortness of Breath   Philip [name] is a 83 y.o. male.   82 year old male with past medical history of hypertension, chronic kidney disease, and coronary artery disease presenting to the emergency department today with cough and shortness of breath.  The patient has apparently been on antibiotics now for the past few weeks.  His primary care doctor started him on Keflex.  He is been taking this as prescribed.  Reports that his shortness of breath is gotten worse.  He came to the ER today further evaluation due to this.  He reports minimal cough with this.  Denies any chest pain.  He does have lower extremity swelling but this seems to be a chronic issue for the patient.  He does not think this is much worse than normal.  He denies any hemoptysis.  Denies any pleuritic pain.   Shortness of Breath      Prior to Admission medications   Medication Sig Start Date End Date Taking? Authorizing Provider  amLODipine  (NORVASC ) 10 MG tablet Take 1 tablet (10 mg total) by mouth at bedtime. 06/10/24   Court Dorn PARAS, MD  Ascorbic Acid (VITAMIN C ER PO) Take 500 mg by mouth daily.    [provider]  atorvastatin  (LIPITOR) 40 MG tablet Take 1 tablet (40 mg total) by mouth daily. 06/10/24   Court Dorn PARAS, MD  B Complex-Biotin-FA (B-COMPLEX PO) Take 1 tablet by mouth daily.    [provider]  ceFAZolin  (ANCEF ) IVPB Inject 6-8 g into the vein daily. Indication:  MSSA endocarditis First Dose: Yes Last Day of Therapy:  8g CI thru 9/9, then 6g CI thru 10/1 Labs - Once weekly:  CBC/D and BMP, Labs - Once weekly: ESR and CRP Method of administration: continuous infusion - 8g daily as continuous infusion thru 9/9 then 6g daily as a continuous infusion thru 10/1 Method of administration may be changed at the discretion of home  infusion pharmacist based upon assessment of the patient and/or caregiver's ability to self-administer the medication ordered. 06/26/24 07/31/24  Vu, Constance T, MD  co-enzyme Q-10 30 MG capsule Take 30 mg by mouth daily.    [provider]  ELIQUIS  5 MG TABS tablet TAKE 1 TABLET TWICE DAILY 02/07/24   Dick, Ernest H Jr., NP  magnesium  oxide (MAG-OX) 400 MG tablet Take 800 mg by mouth daily.    [provider]  metoprolol  tartrate (LOPRESSOR ) 50 MG tablet Take 1 tablet (50 mg total) by mouth 2 (two) times daily. 06/27/24   Drusilla Sabas RAMAN, MD  Multiple Vitamins-Minerals (MULTIVITAMIN WITH MINERALS) tablet Take 1 tablet by mouth daily.    [provider]  Omega-3 Fatty Acids (OMEGA-3 FISH OIL ) 1200 MG CAPS Take 2 capsules (2,400 mg total) by mouth daily. Take 2 1,200 mg capsules daily 11/11/21   Eckard, Tammy, RPH-CPP  polyethylene glycol (MIRALAX  / GLYCOLAX ) packet Take 17 g by mouth daily.    [provider]    Allergies: Patient has no known allergies.    Review of Systems  Respiratory:  Positive for shortness of breath.   All other systems reviewed and are negative.   Updated Vital Signs BP (!) 147/75   Pulse 73   Temp 97.7 F (36.5 C) (Oral)   Resp (!) 24   SpO2 90%   Physical Exam  Vitals and nursing note reviewed.   Gen: NAD, mild conversational dyspnea noted Eyes: PERRL, EOMI HEENT: no oropharyngeal swelling Neck: trachea midline Resp: Diminished at bilateral lung bases with some faint wheezes and coarse breath sounds in the right lower lung field Card: RRR, no murmurs, rubs, or gallops Abd: nontender, nondistended Extremities: no calf tenderness, 2+ edema Vascular: 2+ radial pulses bilaterally, 2+ DP pulses bilaterally Skin: no rashes Psyc: acting appropriately   (all labs ordered are listed, but only abnormal results are displayed) Labs Reviewed  BASIC METABOLIC PANEL WITH GFR - Abnormal; Notable for the following components:      Result  Value   Glucose, Bld 103 (*)    Creatinine, Ser 1.44 (*)    GFR, Estimated 48 (*)    All other components within normal limits  CBC WITH DIFFERENTIAL/PLATELET - Abnormal; Notable for the following components:   RBC 3.68 (*)    Hemoglobin 12.0 (*)    HCT 34.9 (*)    Platelets 76 (*)    All other components within normal limits  PRO BRAIN NATRIURETIC PEPTIDE - Abnormal; Notable for the following components:   Pro Brain Natriuretic Peptide 2,281.0 (*)    All other components within normal limits  RESP PANEL BY RT-PCR (RSV, FLU A&B, COVID)  [id number]    EKG: EKG Interpretation Date/Time:  Sunday July 21 2024 11:01:46 EDT Ventricular Rate:  77 PR Interval:  225 QRS Duration:  118 QT Interval:  403 QTC Calculation: 457 R Axis:   25  Text Interpretation: Sinus rhythm Prolonged PR interval Probable left atrial enlargement Nonspecific intraventricular conduction delay Confirmed by Ula Barter [phone]) on 07/21/2024 11:06:32 AM  Radiology: [facility] Chest Portable 1 View Result Date: 07/21/2024 CLINICAL DATA:  Shortness of breath. EXAM: PORTABLE CHEST 1 VIEW COMPARISON:  06/19/2024 FINDINGS: Cardiopericardial silhouette is at upper limits of normal for size. Diffuse interstitial and basilar airspace disease suggests edema. No substantial pleural effusion. Right PICC line tip overlies the right innominate vein. Telemetry leads overlie the chest. IMPRESSION: Diffuse interstitial and basilar airspace disease suggests edema. Electronically Signed   By: Camellia Candle M.D.   On: 07/21/2024 11:49     Procedures   Medications Ordered in the ED  ipratropium-albuterol  (DUONEB) 0.5-2.5 (3) MG/3ML nebulizer solution 3 mL (3 mLs Nebulization Given 07/21/24 1111)  furosemide  (LASIX ) injection 40 mg (40 mg Intravenous Given 07/21/24 1259)                                    Medical Decision Making 83 year old male with past medical history of hypertension, chronic kidney disease, and coronary artery  disease presented to the emergency department today with shortness of breath.  I will further evaluate the patient here with basic labs as well as's and a chest x-ray and BNP to evaluate for CHF, pulmonary infiltrates, pulmonary edema, or pneumothorax.  Will give the patient a DuoNeb here to see if this helps with the symptoms.  Will obtain RSV/COVID/flu swab to evaluate for viral etiologies.  The patient does have some extraneous breath sounds here and given the duration of symptoms suspicion for pulmonary embolism is relatively low at this time.  I will reevaluate for ultimate disposition.  He is requiring oxygen at this time.  The patient's workup is revealing for CHF.  The patient is given Lasix  here.  He remains on oxygen.  A call is placed to hospitalist service for admission.  Amount and/or Complexity of Data Reviewed Labs: ordered. Radiology: ordered.  Risk Prescription drug management. Decision regarding hospitalization.        Final diagnoses:  Acute on chronic congestive heart failure, unspecified heart failure type [facility])  Hypoxia    ED Discharge Orders     None          Ula Prentice SAUNDERS, MD 07/21/24 1443

## 2024-07-21 NOTE — ED Triage Notes (Signed)
 Stated woke up at 0300 due to being SOB, wears CPAP at night. Denies CP

## 2024-07-21 NOTE — ED Notes (Signed)
 ED Provider at bedside.

## 2024-07-21 NOTE — Consult Note (Addendum)
 Cardiology  Consult:   Patient ID: Philip [name]; MRN: [id number]; DOB: [birth year]   Admission date: 07/21/2024  Primary Care Provider: [name], MD Primary Cardiologist: Court  Chief Complaint:  SOB   History of Present Illness:   Philip [name]  is an 83 year old with coronary artery disease and atrial fibrillation (hx, currently in AFL) who presents with shortness of breath.  He has experienced severe shortness of breath since this morning, describing it as frightening due to the sensation of not being able to breathe. Symptoms began upon waking, with no similar issues the previous day. No prior breathing issues or leg swelling were noted before this morning.  He has a history of coronary artery disease, having undergone CABG, and is managed for peripheral arterial disease. He also has paroxysmal atrial fibrillation and does not feel palpitations or changes in heart rhythm. He is on Eliquis  for anticoagulation.  His activity level has decreased since August due to heart problems related to IE. Previously, he was active, going to the gym three times a week and using the elliptical machine for 45 minutes. He stopped his regular exercise routine in August after experiencing heart-related issues.  He has a history of bacteremia with MSSA and a vegetation, for which he was on long-term antibiotics.   Currently patient has improved with diuresis but still does not feel back to baseline, recently, given 40 IV lasix  for treatment.  Allergies:   No Known Allergies  Social History:   Social History   Socioeconomic History   Marital status: Married    Spouse name: Not on file   Number of children: 3   Years of education: 12   Highest education level: 12th grade  Occupational History   Occupation: retired, Research officer, political party  Tobacco Use   Smoking status: Former    Current packs/day: 0.00    Types: Cigarettes    Quit date: 1979    Years since quitting: 46.7   Smokeless tobacco:  Never   Tobacco comments:    quit smoking in 1979  Vaping Use   Vaping status: Never Used  Substance and Sexual Activity   Alcohol use: Yes    Comment: occasionally - once per week   Drug use: No   Sexual activity: Not Currently  Other Topics Concern   Not on file  Social History Narrative   Lives w/ wife in a 2 story home.     Has one son and 2 stepchildren.  Retired for [employer].     Education: high school.   Social Drivers of Corporate investment banker Strain: Low Risk  (05/15/2024)   Overall Financial Resource Strain (CARDIA)    Difficulty of Paying Living Expenses: Not hard at all  Food Insecurity: No Food Insecurity (06/28/2024)   Hunger Vital Sign    Worried About Running Out of Food in the Last Year: Never true    Ran Out of Food in the Last Year: Never true  Transportation Needs: No Transportation Needs (06/28/2024)   PRAPARE - Administrator, Civil Service (Medical): No    Lack of Transportation (Non-Medical): No  Physical Activity: Sufficiently Active (05/15/2024)   Exercise Vital Sign    Days of Exercise per Week: 3 days    Minutes of Exercise per Session: 120 min  Stress: No Stress Concern Present (05/15/2024)   [employer] of Occupational Health - Occupational Stress Questionnaire    Feeling of Stress: Not at all  Social Connections: Moderately Integrated (06/20/2024)   Social Connection and Isolation Panel    Frequency of Communication with Friends and Family: Once a week    Frequency of Social Gatherings with Friends and Family: Once a week    Attends Religious Services: 1 to 4 times per year    Active Member of [employer] or Organizations: No    Attends Engineer, structural: 1 to 4 times per year    Marital Status: Married  Catering manager Violence: Patient Unable To Answer (06/28/2024)   Humiliation, Afraid, Rape, and Kick questionnaire    Fear of Current or Ex-Partner: Patient unable to answer    Emotionally Abused: Patient unable to  answer    Physically Abused: Patient unable to answer    Sexually Abused: Patient unable to answer    Family History:   The patient's family history includes Cancer in his father, mother, and sister; Colon cancer in an other family member; Deep vein thrombosis in his father; Heart attack in his father and mother; Heart disease in his father, mother, and sister; Hyperlipidemia in his brother, father, and mother; Hypertension in his brother, father, and mother; Peripheral vascular disease in his mother; Prostate cancer (age of onset: 58) in his father; Prostate cancer (age of onset: 57) in his brother; Skin cancer in his brother, father, mother, and sister; Sleep apnea in his brother; Varicose Veins in his mother. There is no history of Diabetes or Stroke.    ROS:  Please see the history of present illness.   Physical Exam/Data:   Vitals:   07/21/24 1400 07/21/24 1445 07/21/24 1531 07/21/24 1637  BP: 125/85   (!) 157/79  Pulse: (!) 104 (!) 103  (!) 115  Resp: 19   20  Temp:   98 F (36.7 C) 98.7 F (37.1 C)  TempSrc:   Oral Oral  SpO2: 91%   99%  Weight:    101.2 kg  Height:    5' 9 (1.753 m)    Intake/Output Summary (Last 24 hours) at 07/21/2024 1742 Last data filed at 07/21/2024 1500 Gross per 24 hour  Intake --  Output 1760 ml  Net -1760 ml   Filed Weights   07/21/24 1637  Weight: 101.2 kg   Body mass index is 32.95 kg/m.   Gen: no distress   Neck: + JVD Ears:  Dempsey Sign Cardiac: No Rubs or Gallops,  Murmur, IRIR tachycardia, +2 radial pulses Respiratory: Crackles bilaterally, normal effort, normal  respiratory rate GI: Soft, nontender, non-distended  MS: No  edema;  moves all extremities Integument: Skin feels warm Neuro:  At time of evaluation, alert and oriented to person/place/time/situation  Psych: Normal affect, patient feels ok  EKG:  The ECG that was done  was personally reviewed and demonstrates SR with 1st HB and LVH  Tele:  AFL with rates ~  100-120  Relevant CV Studies:  Cardiac Studies & Procedures   ______________________________________________________________________________________________ CARDIAC CATHETERIZATION  CARDIAC CATHETERIZATION 10/07/2021  Conclusion   Prox RCA lesion is 100% stenosed.   Ost Cx to Prox Cx lesion is 100% stenosed.   Dist LM to Prox LAD lesion is 100% stenosed.   SVG graft was visualized by angiography and is normal in caliber.   SVG graft was visualized by angiography and is normal in caliber.   LIMA graft was visualized by angiography and is normal in caliber.   The graft exhibits no disease.  Severe three vessel CAD Chronic occlusion of the distal left main  artery and the proximal RCA Patent LIMA to LAD Patent SVG to OM Patent SVG to PDA  Recommendations: No culprit lesion is found. Continue medical management of CAD.  Findings Coronary Findings Diagnostic  Dominance: Right  Left Main Dist LM to Prox LAD lesion is 100% stenosed. The lesion is chronically occluded.  Left Anterior Descending Vessel is large.  Left Circumflex Vessel is large. Ost Cx to Prox Cx lesion is 100% stenosed. The lesion is chronically occluded.  Right Coronary Artery Prox RCA lesion is 100% stenosed. The lesion is chronically occluded.  Saphenous Graft To RPAV SVG graft was visualized by angiography and is normal in caliber.  The graft exhibits no disease.  Saphenous Graft To 2nd Mrg SVG graft was visualized by angiography and is normal in caliber.  LIMA LIMA Graft To Dist LAD LIMA graft was visualized by angiography and is normal in caliber.  Intervention  No interventions have been documented.   STRESS TESTS  MYOCARDIAL PERFUSION IMAGING 01/12/2017  Interpretation Summary  Nuclear stress EF: 47%.  The left ventricular ejection fraction is mildly decreased (45-54%).  Blood pressure demonstrated a hypertensive response to exercise.  There was no ST segment deviation noted during  stress.  There is a small defect of mild severity present in the mid anteroseptal location. The defect is non-reversible and consistent with attenuation artifact of scar.  There is a small defect of mild severity present in the mid anterior location. The defect is reversible and consistent with ischemia in the diagonal territory.  This is an intermediate risk study.  Compared to study of 2015, the apical defect has resolved and there is no change in the anterior/anteroseptal defect.   ECHOCARDIOGRAM  ECHOCARDIOGRAM COMPLETE 06/18/2024  Narrative ECHOCARDIOGRAM REPORT    Patient Name:   Philip [name] Date of Exam: 06/18/2024 Medical Rec #:  [id number]      Height:       69.0 in Accession #:    [id number]     Weight:       234.3 lb Date of Birth:  1941-06-23      BSA:          2.210 m Patient Age:    83 years       BP:           104/59 mmHg Patient Gender: M              HR:           101 bpm. Exam Location:  Inpatient  Procedure: 2D Echo, Color Doppler, Cardiac Doppler and Intracardiac Opacification Agent (Both Spectral and Color Flow Doppler were utilized during procedure).  Indications:    Elevated troponin  History:        Patient has prior history of Echocardiogram examinations, most recent 10/07/2021. CHF, Previous Myocardial Infarction and CAD, Arrythmias:Atrial Fibrillation; Risk Factors:Hypertension and Dyslipidemia.  Sonographer:    Koleen Popper RDCS Referring Phys: [id number] [facility]   Sonographer Comments: Technically difficult study due to poor echo windows. IMPRESSIONS   1. Left ventricular ejection fraction, by estimation, is 50%. The left ventricle has mildly decreased function. Left ventricular endocardial border not optimally defined to evaluate regional wall motion. There is moderate concentric left ventricular hypertrophy. Indeterminate diastolic filling due to E-A fusion. 2. Right ventricular systolic function was not well visualized. The  right ventricular size is not well visualized. Tricuspid regurgitation signal is inadequate for assessing PA pressure. 3. Left atrial size was mild to moderately dilated. 4. The  mitral valve is degenerative. No evidence of mitral valve regurgitation. Mild mitral stenosis. The mean mitral valve gradient is 4.6 mmHg with average heart rate of 99 bpm. Severe mitral annular calcification. 5. The aortic valve is tricuspid. There is mild calcification of the aortic valve. Aortic valve regurgitation is not visualized. Aortic valve sclerosis/calcification is present, without any evidence of aortic stenosis.  Comparison(s): Prior images reviewed side by side. The left ventricular function is worsened. Technically poor quality study, even after administration of Definity  contrast, further challenged due to incessant ectopy. Suspect there is moderate hypokinesis of the basal inferolateral wall.  FINDINGS Left Ventricle: Left ventricular ejection fraction, by estimation, is 50%. The left ventricle has mildly decreased function. Left ventricular endocardial border not optimally defined to evaluate regional wall motion. Definity  contrast agent was given IV to delineate the left ventricular endocardial borders. The left ventricular internal cavity size was normal in size. There is moderate concentric left ventricular hypertrophy. Indeterminate diastolic filling due to E-A fusion.  Right Ventricle: The right ventricular size is not well visualized. Right vetricular wall thickness was not well visualized. Right ventricular systolic function was not well visualized. Tricuspid regurgitation signal is inadequate for assessing PA pressure.  Left Atrium: Left atrial size was mild to moderately dilated.  Right Atrium: Right atrial size was normal in size.  Pericardium: There is no evidence of pericardial effusion.  Mitral Valve: The mitral valve is degenerative in appearance. Severe mitral annular calcification. No  evidence of mitral valve regurgitation. Mild mitral valve stenosis. The mean mitral valve gradient is 4.6 mmHg with average heart rate of 99 bpm.  Tricuspid Valve: The tricuspid valve is grossly normal. Tricuspid valve regurgitation is not demonstrated.  Aortic Valve: The aortic valve is tricuspid. There is mild calcification of the aortic valve. Aortic valve regurgitation is not visualized. Aortic valve sclerosis/calcification is present, without any evidence of aortic stenosis.  Pulmonic Valve: The pulmonic valve was not well visualized. Pulmonic valve regurgitation is trivial. No evidence of pulmonic stenosis.  Aorta: The aortic root and ascending aorta are structurally normal, with no evidence of dilitation.  IAS/Shunts: The interatrial septum was not well visualized.   LEFT VENTRICLE PLAX 2D LVIDd:         5.20 cm LVIDs:         4.30 cm LV PW:         1.50 cm LV IVS:        1.60 cm LVOT diam:     1.90 cm LV SV:         41 LV SV Index:   18 LVOT Area:     2.84 cm   LEFT ATRIUM             Index LA diam:        4.50 cm 2.04 cm/m LA Vol (A2C):   65.3 ml 29.55 ml/m LA Vol (A4C):   72.1 ml 32.62 ml/m LA Biplane Vol: 69.5 ml 31.45 ml/m AORTIC VALVE LVOT Vmax:   99.70 cm/s LVOT Vmean:  63.900 cm/s LVOT VTI:    0.143 m  AORTA Ao Root diam: 3.00 cm Ao Asc diam:  3.50 cm  MITRAL VALVE MV Area (PHT): 2.44 cm  SHUNTS MV Mean grad:  4.6 mmHg  Systemic VTI:  0.14 m MV Decel Time: 310 msec  Systemic Diam: 1.90 cm  Jerel Croitoru MD Electronically signed by Jerel Balding MD Signature Date/Time: 06/18/2024/3:49:24 PM    Final   TEE  ECHO TEE 06/24/2024  Narrative TRANSESOPHOGEAL ECHO REPORT    Patient Name:   Philip [name] Date of Exam: 06/24/2024 Medical Rec #:  [id number]      Height:       69.0 in Accession #:    [id number]     Weight:       229.7 lb Date of Birth:  Jul 27, 1941      BSA:          2.191 m Patient Age:    83 years       BP:           166/89  mmHg Patient Gender: M              HR:           77 bpm. Exam Location:  Inpatient  Procedure: Transesophageal Echo, 3D Echo, Cardiac Doppler and Color Doppler (Both Spectral and Color Flow Doppler were utilized during procedure).  Indications:     Endocarditis  History:         Patient has prior history of Echocardiogram examinations, most recent 06/18/2024. Arrythmias:Atrial Fibrillation; Risk Factors:Hypertension, Dyslipidemia and Sleep Apnea.  Sonographer:     Damien Senior RDCS Referring Phys:  [id number] ROLLO JONELLE [name] Diagnosing Phys: Soyla Merck MD   Sonographer Comments: Patient had severe obstructive breathing pattern during TEE, complicating imaging windows.   PROCEDURE: After discussion of the risks and benefits of a TEE, an informed consent was obtained from the patient. TEE procedure time was 10 minutes. The transesophogeal probe was passed without difficulty through the esophogus of the patient. Imaged were obtained with the patient in a left lateral decubitus position. Sedation performed by different physician. The patient was monitored while under deep sedation. Anesthestetic sedation was provided intravenously by Anesthesiology: 301mg  of Propofol , 100mg  of Lidocaine . Image quality was good. The patient's vital signs; including heart rate, blood pressure, and oxygen saturation; remained stable throughout the procedure. The patient developed no complications during the procedure.  IMPRESSIONS   1. There is a large, mobile vegetation measuring 1.9 x 1.2cm attached to the atrial aspect of the mitral valve, likely adherent to a shelf of mitral annular calcification on the posterior annulus, adjacent to A3/P3 scallop. Evidence of small leaflet perforation at site of vegetation with trivial to mild additional jet MR at site of perforation. In total, MR appears mild-moderate. The mitral valve is degenerative. Mild to moderate mitral valve regurgitation. Mild mitral  stenosis. The mean mitral valve gradient is 6.0 mmHg with average heart rate of 82 bpm. Moderate to severe mitral annular calcification. 2. Left ventricular ejection fraction, by estimation, is 55 to 60%. The left ventricle has normal function. 3. Right ventricular systolic function is normal. The right ventricular size is normal. 4. Left atrial size was mildly dilated. No left atrial/left atrial appendage thrombus was detected. The LAA emptying velocity was 44 cm/s. 5. Right atrial size was mildly dilated. 6. The aortic valve is grossly normal. Aortic valve regurgitation is trivial. 7. There is Moderate (Grade III) atheroma plaque involving the aortic arch and descending aorta. 8. 3D performed of the mitral valve and demonstrates mitral valve vegetation.  Conclusion(s)/Recommendation(s): Critical findings reported to Dr. Bentley. Wendel and acknowledged at 06/24/24 at 1500.  FINDINGS Left Ventricle: Left ventricular ejection fraction, by estimation, is 55 to 60%. The left ventricle has normal function. The left ventricular internal cavity size was normal in size.  Right Ventricle: The right ventricular size is normal. No increase in right ventricular wall thickness. Right ventricular  systolic function is normal.  Left Atrium: Left atrial size was mildly dilated. No left atrial/left atrial appendage thrombus was detected. The LAA emptying velocity was 44 cm/s.  Right Atrium: Right atrial size was mildly dilated.  Pericardium: There is no evidence of pericardial effusion.  Mitral Valve: There is a large, mobile vegetation measuring 1.9 x 1.2cm attached to the atrial aspect of the mitral valve, likely adherent to a shelf of mitral annular calcification on the posterior annulus, adjacent to A3/P3 scallop. Evidence of small leaflet perforation at site of vegetation with trivial to mild additional jet MR at site of perforation. In total, MR appears mild-moderate. The mitral valve is  degenerative in appearance. Moderate to severe mitral annular calcification. Mild to moderate mitral valve regurgitation. Mild mitral valve stenosis. MV peak gradient, 10.6 mmHg. The mean mitral valve gradient is 6.0 mmHg with average heart rate of 82 bpm.  Tricuspid Valve: The tricuspid valve is grossly normal. Tricuspid valve regurgitation is trivial.  Aortic Valve: The aortic valve is grossly normal. Aortic valve regurgitation is trivial.  Pulmonic Valve: The pulmonic valve was grossly normal. Pulmonic valve regurgitation is trivial.  Aorta: The aortic root and ascending aorta are structurally normal, with no evidence of dilitation. There is moderate (Grade III) atheroma plaque involving the aortic arch and descending aorta.  Venous: A normal flow pattern is recorded from the left upper pulmonary vein.  IAS/Shunts: The interatrial septum appears to be lipomatous. No atrial level shunt detected by color flow Doppler.  Additional Comments: 3D was performed not requiring image post processing on an independent workstation and was abnormal. Spectral Doppler performed.   AORTA Ao Root diam: 3.60 cm Ao Asc diam:  3.70 cm  MITRAL VALVE MV Peak grad: 10.6 mmHg MV Mean grad: 6.0 mmHg MV Vmax:      1.63 m/s MV Vmean:     123.0 cm/s  Soyla Merck MD Electronically signed by Soyla Merck MD Signature Date/Time: 06/24/2024/4:06:04 PM    Final  MONITORS  CARDIAC EVENT MONITOR 11/17/2021  Narrative  Normal sinus rhythm with one episode of Atrial fibrillationwith RVR lasting 19 minutes.  Bradycardia noted during sleep.  No pauses > 3 seconds noted.       ______________________________________________________________________________________________      Laboratory Data:  Chemistry Recent Labs  Lab 07/14/24 1932 07/21/24 1106  NA 138 138  K 4.4 4.2  CL 97* 103  CO2 27 23  GLUCOSE 107* 103*  BUN 15 21  CREATININE 1.18 1.44*  CALCIUM  10.1 9.3  GFRNONAA >60 48*   ANIONGAP 14 13    Recent Labs  Lab 07/14/24 1932  PROT 7.3  ALBUMIN 4.2  AST 31  ALT <5  ALKPHOS 58  BILITOT 0.8   Hematology Recent Labs  Lab 07/14/24 1932 07/21/24 1106  WBC 4.8 7.5  RBC 4.08* 3.68*  HGB 13.2 12.0*  HCT 39.7 34.9*  MCV 97.3 94.8  MCH 32.4 32.6  MCHC 33.2 34.4  RDW 13.2 13.6  PLT 71* 76*   BNP Recent Labs  Lab 07/21/24 1106  PROBNP 2,281.0*     Assessment and Plan:    Acute decompensated heart failure (unclear EF) - Acute decompensated heart failure with fluid overload, evidenced by bibasilar edema, infiltrate on chest x-ray, and elevated NT-proBNP. Symptoms include acute onset shortness of breath. The etiology of the fluid overload is unclear, with potential contributions from mitral valve regurgitation, atrial flutter, or other factors. - Increase Lasix  to 80 mg IV BID starting tomorrow to  promote diuresis and fluid removal. - he may get repeat TEE as part or his IE care  Atrial flutter with paroxysmal atrial fibrillation Currently in atrial flutter, hx of to paroxysmal atrial fibrillation. The plan is to control the heart rate and consider rhythm control after fluid status is optimized. - Order EKG to monitor rhythm. 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 The relationship between the regurgitation, potential infection, and current symptoms is unclear. - abx as per primary - he may get TEE as part of his care  AKI with normal baseline - Monitor renal function closely during diuresis. - Adjust diuretic therapy as needed based on renal function.  Hypertension Elevated blood pressure noted. Hypertension management is important in the context of heart  failure and atrial flutter. - I have increased diuresis and BB, HTN as per primary  CAD s/p CABG - home statin , no evidence of acute MI   For questions or updates, please contact [facility] Please consult [email or web address] for contact info under Cardiology/STEMI.   Stanly Leavens, MD FASE [facility] Cardiologist [facility]  [address] [place], [address] [place], [place] [address] [phone]  5:42 PM

## 2024-07-21 NOTE — ED Notes (Signed)
 Pt rates current distress level at 7/10 compared to 10/10 when he came in.

## 2024-07-21 NOTE — ED Notes (Signed)
 SpO2 in triage 86% on room air, moved to room3 placed on 2lpm [place] without improvement in SpO2.  Gradually increased to 6lpm, SpO2 now 92%.

## 2024-07-21 NOTE — ED Notes (Signed)
 Called [facility] for transport.

## 2024-07-21 NOTE — ED Notes (Signed)
 PT's HR cranks up when he stood to pee. Got to about 138 before settling back to 109 as shown.

## 2024-07-22 ENCOUNTER — Inpatient Hospital Stay (HOSPITAL_COMMUNITY)

## 2024-07-22 DIAGNOSIS — I330 Acute and subacute infective endocarditis: Secondary | ICD-10-CM | POA: Insufficient documentation

## 2024-07-22 DIAGNOSIS — I480 Paroxysmal atrial fibrillation: Secondary | ICD-10-CM | POA: Diagnosis not present

## 2024-07-22 DIAGNOSIS — I10 Essential (primary) hypertension: Secondary | ICD-10-CM

## 2024-07-22 DIAGNOSIS — E66811 Obesity, class 1: Secondary | ICD-10-CM

## 2024-07-22 DIAGNOSIS — I5031 Acute diastolic (congestive) heart failure: Secondary | ICD-10-CM

## 2024-07-22 DIAGNOSIS — I2510 Atherosclerotic heart disease of native coronary artery without angina pectoris: Secondary | ICD-10-CM | POA: Diagnosis not present

## 2024-07-22 DIAGNOSIS — I4891 Unspecified atrial fibrillation: Secondary | ICD-10-CM

## 2024-07-22 DIAGNOSIS — I340 Nonrheumatic mitral (valve) insufficiency: Secondary | ICD-10-CM

## 2024-07-22 DIAGNOSIS — I5033 Acute on chronic diastolic (congestive) heart failure: Secondary | ICD-10-CM | POA: Diagnosis not present

## 2024-07-22 DIAGNOSIS — N1831 Chronic kidney disease, stage 3a: Secondary | ICD-10-CM

## 2024-07-22 DIAGNOSIS — I7143 Infrarenal abdominal aortic aneurysm, without rupture: Secondary | ICD-10-CM

## 2024-07-22 LAB — CBC WITH DIFFERENTIAL/PLATELET
Abs Immature Granulocytes: 0.24 K/uL — ABNORMAL HIGH (ref 0.00–0.07)
Basophils Absolute: 0 K/uL (ref 0.0–0.1)
Basophils Relative: 0 %
Eosinophils Absolute: 0.1 K/uL (ref 0.0–0.5)
Eosinophils Relative: 1 %
HCT: 40.5 % (ref 39.0–52.0)
Hemoglobin: 13.5 g/dL (ref 13.0–17.0)
Immature Granulocytes: 3 %
Lymphocytes Relative: 16 %
Lymphs Abs: 1.4 K/uL (ref 0.7–4.0)
MCH: 31.7 pg (ref 26.0–34.0)
MCHC: 33.3 g/dL (ref 30.0–36.0)
MCV: 95.1 fL (ref 80.0–100.0)
Monocytes Absolute: 1.1 K/uL — ABNORMAL HIGH (ref 0.1–1.0)
Monocytes Relative: 12 %
Neutro Abs: 6.2 K/uL (ref 1.7–7.7)
Neutrophils Relative %: 68 %
Platelets: 79 K/uL — ABNORMAL LOW (ref 150–400)
RBC: 4.26 MIL/uL (ref 4.22–5.81)
RDW: 13.8 % (ref 11.5–15.5)
WBC: 9 K/uL (ref 4.0–10.5)
nRBC: 0 % (ref 0.0–0.2)

## 2024-07-22 LAB — COMPREHENSIVE METABOLIC PANEL WITH GFR
ALT: 6 U/L (ref 0–44)
AST: 26 U/L (ref 15–41)
Albumin: 3.7 g/dL (ref 3.5–5.0)
Alkaline Phosphatase: 63 U/L (ref 38–126)
Anion gap: 13 (ref 5–15)
BUN: 20 mg/dL (ref 8–23)
CO2: 23 mmol/L (ref 22–32)
Calcium: 9 mg/dL (ref 8.9–10.3)
Chloride: 103 mmol/L (ref 98–111)
Creatinine, Ser: 1.33 mg/dL — ABNORMAL HIGH (ref 0.61–1.24)
GFR, Estimated: 53 mL/min — ABNORMAL LOW (ref >60)
Glucose, Bld: 119 mg/dL — ABNORMAL HIGH (ref 70–99)
Potassium: 4 mmol/L (ref 3.5–5.1)
Sodium: 139 mmol/L (ref 135–145)
Total Bilirubin: 1.3 mg/dL — ABNORMAL HIGH (ref 0.0–1.2)
Total Protein: 6.3 g/dL — ABNORMAL LOW (ref 6.5–8.1)

## 2024-07-22 LAB — ECHOCARDIOGRAM LIMITED
Height: 69 in
Weight: 3597.91 [oz_av]

## 2024-07-22 LAB — PHOSPHORUS: Phosphorus: 3.7 mg/dL (ref 2.5–4.6)

## 2024-07-22 LAB — T4, FREE: Free T4: 0.92 ng/dL (ref 0.61–1.12)

## 2024-07-22 LAB — TSH: TSH: 7.337 u[IU]/mL — ABNORMAL HIGH (ref 0.350–4.500)

## 2024-07-22 LAB — MAGNESIUM: Magnesium: 2.1 mg/dL (ref 1.7–2.4)

## 2024-07-22 MED ORDER — CHLORHEXIDINE GLUCONATE CLOTH 2 % EX PADS
6.0000 | MEDICATED_PAD | Freq: Every day | CUTANEOUS | Status: DC
Start: 2024-07-22 — End: 2024-07-28
  Administered 2024-07-22 – 2024-07-27 (×6): 6 via TOPICAL

## 2024-07-22 MED ORDER — METOLAZONE 2.5 MG PO TABS
2.5000 mg | ORAL_TABLET | Freq: Once | ORAL | Status: AC
  Administered 2024-07-22: 2.5 mg via ORAL
  Filled 2024-07-22: qty 1

## 2024-07-22 MED ORDER — SODIUM CHLORIDE 0.9% FLUSH: 10.0000 mL | INTRAVENOUS | Status: DC | PRN

## 2024-07-22 MED ORDER — AMIODARONE LOAD VIA INFUSION
150.0000 mg | Freq: Once | INTRAVENOUS | Status: AC
  Administered 2024-07-22: 150 mg via INTRAVENOUS
  Filled 2024-07-22: qty 83.34

## 2024-07-22 MED ORDER — FUROSEMIDE 10 MG/ML IJ SOLN
80.0000 mg | Freq: Two times a day (BID) | INTRAMUSCULAR | Status: DC
  Administered 2024-07-22 – 2024-07-23 (×4): 80 mg via INTRAVENOUS
  Filled 2024-07-22 (×4): qty 8

## 2024-07-22 MED ORDER — POTASSIUM CHLORIDE CRYS ER 20 MEQ PO TBCR: 40.0000 meq | EXTENDED_RELEASE_TABLET | ORAL | Status: DC

## 2024-07-22 MED ORDER — AMIODARONE HCL IN DEXTROSE 360-4.14 MG/200ML-% IV SOLN
60.0000 mg/h | INTRAVENOUS | Status: AC
  Administered 2024-07-22 (×2): 60 mg/h via INTRAVENOUS
  Filled 2024-07-22 (×2): qty 200

## 2024-07-22 MED ORDER — AMIODARONE HCL IN DEXTROSE 360-4.14 MG/200ML-% IV SOLN
30.0000 mg/h | INTRAVENOUS | Status: AC
Start: 2024-07-22 — End: 2024-07-25
  Administered 2024-07-22 – 2024-07-24 (×6): 30 mg/h via INTRAVENOUS
  Filled 2024-07-22 (×6): qty 200

## 2024-07-22 MED ORDER — SODIUM CHLORIDE 0.9% FLUSH
10.0000 mL | Freq: Two times a day (BID) | INTRAVENOUS | Status: DC
  Administered 2024-07-22 – 2024-07-25 (×4): 10 mL
  Administered 2024-07-26: 20 mL
  Administered 2024-07-26 – 2024-07-27 (×2): 10 mL

## 2024-07-22 MED ORDER — METOPROLOL TARTRATE 5 MG/5ML IV SOLN
5.0000 mg | Freq: Once | INTRAVENOUS | Status: AC | PRN
  Administered 2024-07-22: 5 mg via INTRAVENOUS
  Filled 2024-07-22: qty 5

## 2024-07-22 MED ORDER — METOPROLOL TARTRATE 5 MG/5ML IV SOLN: 5.0000 mg | Freq: Once | INTRAVENOUS | Status: DC

## 2024-07-22 NOTE — Progress Notes (Signed)
 OT Cancellation Note  Patient Details Name: Philip [name] MRN: [id number] DOB: Mar 22, 1941   Cancelled Treatment:    Reason Eval/Treat Not Completed: Patient not medically ready (Afib with RVR sustained 140 supine)  [name] 07/22/2024, 9:13 AM

## 2024-07-22 NOTE — Progress Notes (Signed)
   07/22/24 0136  BiPAP/CPAP/SIPAP  $ Non-Invasive Home Ventilator  Initial  $ Face Mask Medium Yes  BiPAP/CPAP/SIPAP Pt Type Adult  BiPAP/CPAP/SIPAP Resmed  Mask Type Full face mask  Dentures removed? Not applicable  Mask Size Medium  Respiratory Rate 20 breaths/min  Pressure Support 10 cmH20  Flow Rate 15 lpm  Patient Home Mask No  Patient Home Tubing No  Auto Titrate No  Device Plugged into RED Power Outlet Yes  BiPAP/CPAP /SiPAP Vitals  Bilateral Breath Sounds Diminished

## 2024-07-22 NOTE — Progress Notes (Signed)
 PT Cancellation Note  Patient Details Name: Philip [name] MRN: [id number] DOB: 1941/03/11   Cancelled Treatment:    Reason Eval/Treat Not Completed: Patient not medically ready (HR 140 at rest)   [place] Dost B Barbra Miner 07/22/2024, 8:05 AM Lenoard SQUIBB, PT Acute Rehabilitation Services Office: [phone]

## 2024-07-22 NOTE — Assessment & Plan Note (Signed)
 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

## 2024-07-22 NOTE — Assessment & Plan Note (Addendum)
 MSSA mitral valve endocarditis.  1.8 x 1.2 cm vegetation on mitral valve, 05/2024 Continue antibiotic therapy with Cefazolin  to complete on 07/31/2024. Due to heart failure decompensation with check limited echocardiogram.

## 2024-07-22 NOTE — Assessment & Plan Note (Signed)
 Atrial flutter with variable block.   Heart rate has improved.  Continue IV amiodarone  for rate control.  Continue metoprolol  25 mg po tid.  Anticoagulation with apixaban   Continue telemetry monitoring Keep K at 4 and Mg at 2

## 2024-07-22 NOTE — Assessment & Plan Note (Signed)
 No chest pain, elevation high sensitive troponin due to tachycardia and heart failure No acute coronary syndrome Continue satin.

## 2024-07-22 NOTE — Progress Notes (Addendum)
 Progress Note   Patient: Philip [name] FMW:[id number] DOB: 05-07-1941 DOA: 07/21/2024     1 DOS: the patient was seen and examined on 07/22/2024   Brief hospital course: Philip [name] was admitted to the hospital with the working diagnosis of heart failure exacerbation in the setting of atrial fibrillation with RVR.   83 yo male with the past medical history of mitral valve infective endocarditis, hypertension, CKD, abdominal aortic aneurysm, who presented with dyspnea and congestion. Reported 24 hrs of worsening dyspnea associated with orthopnea. On the day of admission he woke up at 300 am with severe dyspnea, prompting him to come to the ED. On his initial physical examination his blood pressure 125/85. HR 104 rr 20 and 02 saturation 91%  Lungs with bilateral diffuse rales and rhonchi, heart with S1 and S2 present, irregularly irregular, systolic murmur at the left lower sternal border, abdomen with no distention, and positive lower extremity edema +++  Na 138, K 4.2 CL 103 bicarbonate 23, glucose 103 bun 21 cr 1.44  BNP 2,281  Wbc 7.6 hgb 12.0 plt 76  Sars covid 19 negative RSV negative  Influenza negative   Chest radiograph with cardiomegaly, bilateral hilar vascular congestion, bilateral central interstitial infiltrates, predominant at the lower lobes.   #1 EKG 77 bpm, normal axis, normal intervals, qtc 457, sinus rhythm with bilateral atrial enlargement, no significant ST segment or T wave changes.  #2 EKG 115 bpm, left axis deviation, qtc 473, atrial flutter with variable block, 1:1 and 2:1, with no significant ST segment or  T wave changes.   Patient was placed on IV furosemide  Developed respiratory distress and was placed on Bipap. 09/22 added IV amiodarone , increased diuresis.  Assessment and Plan: * Acute on chronic diastolic CHF (congestive heart failure) (HCC) Echocardiogram with preserved LV systolic function EF 50%, moderate concentric LVH, E-A fusion, RV not well  visualized, LA with mild to moderate dilatation, mild mitral stenosis.  Urine output documented at 2,160 ml  Systolic blood pressure 120 to 130 mmHg.  Continue to have significant volume overload, noted cold lower extremities,.   Plan to continue aggressive diuresis with furosemide  80 mg IV bid Add one dose of metolazone  2.5 mg  Possible addition of SGLT 2 inh and spironolactone .  Rate control atrial fibrillation with amiodarone   Repeat limited echocardiogram to follow up on mitral valve.   Paroxysmal atrial fibrillation (HCC) Atrial flutter with variable block.   Continue IV amiodarone  for rate control.  Anticoagulation with apixaban  Possible inpatient direct current cardioversion when patient more euvolemic.  Continue telemetry monitoring Keep K at 4 and Mg at 2  Essential hypertension Continue blood pressure monitoring Aggressive diuresis.  Will benefit from further afterload reduction when atrial flutter better controlled.   CAD (coronary artery disease) No chest pain, elevation high sensitive troponin due to tachycardia and heart failure No acute coronary syndrome Continue satin.  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  1.8 x 1.2 cm vegetation on mitral valve, 05/2024 Continue antibiotic therapy with Cefazolin  to complete on 07/31/2024. Due to heart failure decompensation with check limited echocardiogram.    Chronic kidney disease, stage 3a (HCC) Renal function with serum cr at 1.33 with K at 4.0 and serum bicarbonate at 23  Na 139 and Mg 2,1   Plan to continue diuresis with IV furosemide  Metolazone  2.5 mg x1 Follow up renal function and electrolytes.   AAA (abdominal aortic aneurysm) (HCC) Follow up as outpatient   Obesity (BMI 30.0-34.9) Calculated  BMI is 33.2 consistent in obesity class 1        Subjective: Patient continue to have dyspnea, and lower extremity edema, no chest pain and no palpitations.   Physical  Exam: Vitals:   07/22/24 0700 07/22/24 0705 07/22/24 0811 07/22/24 0832  BP: 138/81 138/80  130/89  Pulse: (!) 146 72  100  Resp:    18  Temp:    98 F (36.7 C)  TempSrc:    Oral  SpO2: 99% 95% 95% 96%  Weight:      Height:       Neurology awake and alert, deconditioned and ill looking appearing ENT with mild pallor Cardiovascular with S1 and S2 present, irregular with no gallops or rubs, positive systolic murmur at the left lower sternal border. Positive JVD Respiratory with bilateral diffuse rales and rhonchi, mild accessory muscle use,  Abdomen with no distention, non tender Positive lower extremity edema +++ cold distal legs.   Data Reviewed:    Family Communication: no family at the bedside   Disposition: Status is: Inpatient Remains inpatient appropriate because: heart failure   Planned Discharge Destination: Home    Author: Elidia Toribio Furnace, MD 07/22/2024 9:39 AM  For on call review [email or web address].

## 2024-07-22 NOTE — Progress Notes (Signed)
   07/22/24 1300  Vitals  Temp 97.8 F (36.6 C)  Temp Source Oral  BP (!) 139/93  MAP (mmHg) 105  BP Location Left Arm  BP Method Automatic  Patient Position (if appropriate) Lying  Pulse Rate (!) 130  Pulse Rate Source Monitor  ECG Heart Rate (!) 130  Resp 18  Level of Consciousness  Level of Consciousness Alert  Oxygen Therapy  SpO2 96 %  O2 Device HFNC  O2 Flow Rate (L/min) 13 L/min  Patient Activity (if Appropriate) In bed  Pulse Oximetry Type Continuous  Pain Assessment  Pain Scale 0-10  Pain Score 0  Glasgow Coma Scale  Eye Opening 4  Best Verbal Response (NON-intubated) 4  Best Motor Response 6  Glasgow Coma Scale Score 14  Note  Patient Observations in bed at rest   ECHO- pending , AMIO drip running, Cards Consult

## 2024-07-22 NOTE — Progress Notes (Signed)
 Called to room by patient approximately 0615. Patient short of breath with increased work of breathing. HR sustaining 140-150. BP elevated at 213/119. Notified hospitalist. Patient given 5mg  IV Lopressor , 5 mg IV hydralazine , and 80 mg IV lasix . Patient placed back on bipap by RT. HR sustained in 140s-150s. Paged cardiology to bedside for evaluation.  Patient placed on amiodarone  drip.

## 2024-07-22 NOTE — Progress Notes (Addendum)
 Progress Note  Patient Name: Philip [name] Date of Encounter: 07/22/2024 [facility] Cardiologist: Dorn Lesches, MD   Interval Summary    Called to bedside this morning with patient in atrial fibrillation with RVR. Worsening shortness of breath overnight, now on Bipap with heart rate in the 140-150s and hypertensive.   Vital Signs Vitals:   07/22/24 0630 07/22/24 0635 07/22/24 0640 07/22/24 0645  BP: (!) 190/172 (!) 185/119 (!) 160/105 (!) 172/97  Pulse: 62 (!) 135 (!) 139   Resp:      Temp:      TempSrc:      SpO2: 96% 94% 95%   Weight:      Height:        Intake/Output Summary (Last 24 hours) at 07/22/2024 0705 Last data filed at 07/21/2024 1844 Gross per 24 hour  Intake 240 ml  Output 2160 ml  Net -1920 ml      07/22/2024    4:15 AM 07/21/2024    4:37 PM 07/15/2024    2:35 PM  Last 3 Weights  Weight (lbs) 224 lb 13.9 oz 223 lb 1.7 oz 221 lb  Weight (kg) 102 kg 101.2 kg 100.245 kg      Telemetry/ECG   Atrial fibrillation with RVR, rates 140-150s - Personally Reviewed  Physical Exam  GEN: on Bipap sitting up in bed    Neck: + JVD Cardiac: Irreg Irreg, tachycardic, no murmurs, rubs, or gallops.  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   Acute decompensated heart failure -- Presented with acute volume overload, bibasilar edema noted on chest x-ray with elevated proBNP -- Echocardiogram 06/18/2024 LVEF of 50%, RV not well-visualized, moderate left atrial enlargement, mild mitral stenosis. -- Started on IV Lasix  80 mg twice daily, net -1.9 L this morning -- Intermittent episodes of BiPAP use overnight with him being placed back on BiPAP consistently this morning about an hour ago.  He is tachycardic and hypertensive at this time.  Concern for decline in EF since last admission given presentation  -- IV Lasix  80 mg x 1 now, continue 80mg  BID   Atrial fibrillation/atrial flutter with RVR  -- Elevated heart rates throughout the evening in the 120s with worsening RVR with rates in the 140s to 150 range this morning -- He is currently on Eliquis  5 mg twice daily -- Given concern over possible drop in EF will start IV amiodarone  150 mg bolus x 1 with drip -- May need to transition Eliquis  over to heparin  pending his respiratory status  Recent diagnosis of mitral valve endocarditis/MSSA -- On IV antibiotics per ID -- TEE 8/25 with large mobile vegetation of 1.9 x 1.2 cm attached to atrial aspect of mitral valve with evidence of small leaflet perforation at site of vegetation with trivial to mild additional jet MR at site of perforation.  MR mild to moderate with mild mitral stenosis  HTN -- Blood pressure initially elevated this morning, suspect compensatory -- Currently on metoprolol  25 mg twice daily, hold for now  Hyperlipidemia -- On atorvastatin  40 mg daily PTA  CAD status post CABG '99 -- Most recent cath 2022 with patent LIMA to LAD, SVG to OM, SVG to PDA -- No aspirin  with need for DOAC  CKD III --  Cr 1.44>>1.33  For questions or updates, please contact  [facility] Please consult [email or web address] for contact info under      Signed, Manuelita Rummer, NP

## 2024-07-22 NOTE — Assessment & Plan Note (Signed)
 Continue blood pressure monitoring Aggressive diuresis.  Will benefit from further afterload reduction when atrial flutter better controlled.

## 2024-07-22 NOTE — Progress Notes (Signed)
  Echocardiogram 2D Echocardiogram has been performed.  Tinnie FORBES Gosling RDCS 07/22/2024, 3:40 PM

## 2024-07-22 NOTE — Progress Notes (Signed)
 Initial Nutrition Assessment  DOCUMENTATION CODES:   Not applicable  INTERVENTION:  2 gram sodium diet Encourage PO intake  Offer oral nutrition supplements as needed    NUTRITION DIAGNOSIS:   Inadequate oral intake related to other (see comment) (food preferences) as evidenced by per patient/family report.   GOAL:   Patient will meet greater than or equal to 90% of their needs   MONITOR:   PO intake  REASON FOR ASSESSMENT:   Consult  (nutritional goals)  ASSESSMENT:   Past medical history of mitral valve infective endocarditis, hypertension, CKD, abdominal aortic aneurysm, who presented with dyspnea and congestion.  Met with patient and spouse at bedside. Patient reports fair appetite at baseline, does not eat as well in the hospital due to dislike of food options. Declined need for oral nutrition supplement at this time. No know recent changes to weight, reports in the 220s. Patient reports mild constipation at baseline. No concern for nausea/vomiting or diarrhea at this time.   Meds: Lasix  80 mg IV BID Mag-Ox 800 mg daily   Labs: Glu 119  Cr 1.33  T.Bili 1.3  GFR 53      Wt Readings from Last 10 Encounters:  07/22/24 102 kg  07/15/24 100.2 kg  07/14/24 103.4 kg  07/09/24 103.4 kg  07/02/24 101 kg  06/27/24 99.6 kg  06/10/24 106.6 kg  05/22/24 104.3 kg  03/27/24 103.4 kg  02/07/24 104.1 kg   NUTRITION - FOCUSED PHYSICAL EXAM:  Flowsheet Row Most Recent Value  Orbital Region No depletion  Upper Arm Region No depletion  Thoracic and Lumbar Region No depletion  Buccal Region No depletion  Temple Region No depletion  Clavicle Bone Region No depletion  Clavicle and Acromion Bone Region No depletion  Scapular Bone Region No depletion  Dorsal Hand No depletion  Patellar Region No depletion  Anterior Thigh Region No depletion  Posterior Calf Region No depletion  Hair Reviewed  Eyes Reviewed  Mouth Reviewed  Skin Reviewed  Nails Reviewed     Diet Order:   Diet Order             Diet 2 gram sodium Room service appropriate? Yes; Fluid consistency: Thin  Diet effective now                   EDUCATION NEEDS:   No education needs have been identified at this time  Skin:  Skin Assessment: Reviewed RN Assessment  Last BM:  9/22 type 5  Height:   Ht Readings from Last 1 Encounters:  07/21/24 5' 9 (1.753 m)    Weight:   Wt Readings from Last 1 Encounters:  07/22/24 102 kg    Ideal Body Weight:  72.7 kg  BMI:  Body mass index is 33.21 kg/m.  Estimated Nutritional Needs:   Kcal:  8181-7818 kcals  Protein:  73-87 grams  Fluid:  >1.8L/d    Senaya Dicenso, MS, RD, LDN Clinical Dietitian  Please see [facility] for contact information.

## 2024-07-22 NOTE — Assessment & Plan Note (Signed)
 Follow up as outpatient

## 2024-07-22 NOTE — Assessment & Plan Note (Signed)
 Renal function with serum cr at 1.33 with K at 4.0 and serum bicarbonate at 23  Na 139 and Mg 2,1   Plan to continue diuresis with IV furosemide  Metolazone  2.5 mg x1 Follow up renal function and electrolytes.

## 2024-07-22 NOTE — Progress Notes (Signed)
 Heart Failure Navigator Progress Note  Assessed for Heart & Vascular TOC clinic readiness.  Patient does not meet criteria due to admission with Atrial fibrillation with RVR. EF 55-60%. No HF TOC per Dr. Noralee. Patient has follow up with [facility] on 08/06/2024, [facility] 08/16/2024, and [facility] on 09/23/2024. .   Navigator will sign off at this time.   Stephane Haddock, BSN, Scientist, clinical (histocompatibility and immunogenetics) Only

## 2024-07-22 NOTE — Progress Notes (Signed)
 Patient was seen for increased HR and increased SOB.   HPI: 83 yr old man with HTN, CAD, CKD 3A, a fib on Eliquis , and endocarditis involving native MV on cefazolin  who presented yesterday morning with SOB, and was found to be in acute CHF with acute hypoxic respiratory failure.   He was seen by cardiology, started on IV Lasix  and continued on Eliquis  and metoprolol .   Currently, he is in rapid a fib with HR 150 and complaining of increased SOB while on non-rebreather.   S: Feels like he can't catch his breath. Denies chest pain.   O: BP 190/72, HR 145, RR 28, O2 sat 92% on NRB  Awake/alert, no pallor or diaphoresis, labored respirations, fine rales bilaterally, JVD.   A/P: Rapid atrial fibrillation, acute hypoxic respiratory failure d/t acute CHF.  Plan for IV Lopressor , BiPAP, and give AM Lasix  dose now.

## 2024-07-22 NOTE — Plan of Care (Signed)
  Problem: Nutrition: Goal: Adequate nutrition will be maintained Outcome: Progressing   Problem: Elimination: Goal: Will not experience complications related to bowel motility Outcome: Progressing   Problem: Skin Integrity: Goal: Risk for impaired skin integrity will decrease Outcome: Progressing   

## 2024-07-22 NOTE — Hospital Course (Addendum)
 Philip [name] was admitted to the hospital with the working diagnosis of heart failure exacerbation in the setting of atrial fibrillation with RVR.   83 yo male with the past medical history of mitral valve infective endocarditis, hypertension, CKD, abdominal aortic aneurysm, who presented with dyspnea and congestion. Reported 24 hrs of worsening dyspnea associated with orthopnea. On the day of admission he woke up at 300 am with severe dyspnea, prompting him to come to the ED. On his initial physical examination his blood pressure 125/85. HR 104 rr 20 and 02 saturation 91%  Lungs with bilateral diffuse rales and rhonchi, heart with S1 and S2 present, irregularly irregular, systolic murmur at the left lower sternal border, abdomen with no distention, and positive lower extremity edema +++  Na 138, K 4.2 CL 103 bicarbonate 23, glucose 103 bun 21 cr 1.44  BNP 2,281  Wbc 7.6 hgb 12.0 plt 76  Sars covid 19 negative RSV negative  Influenza negative   Chest radiograph with cardiomegaly, bilateral hilar vascular congestion, bilateral central interstitial infiltrates, predominant at the lower lobes.   #1 EKG 77 bpm, normal axis, normal intervals, qtc 457, sinus rhythm with bilateral atrial enlargement, no significant ST segment or T wave changes.  #2 EKG 115 bpm, left axis deviation, qtc 473, atrial flutter with variable block, 1:1 and 2:1, with no significant ST segment or  T wave changes.   Patient was placed on IV furosemide  Developed respiratory distress and was placed on Bipap. 09/22 added IV amiodarone , increased diuresis. 09/23 improved volume status and heart rate, continue atrial fibrillation.

## 2024-07-22 NOTE — Assessment & Plan Note (Addendum)
 Echocardiogram with preserved LV systolic function EF 60 to 65%, LA with mild dilatation, 2.5 x 1.5 cm mobile vegetation attached to the atrial surface of the posterior leaflet of the mitral valve near the base of the leaflet. It prolapses across the leaflet. Positive mitral valve regurgitation.   Urine output documented documented at 1500 ml but weight loss has been 4 Kg over the last 24 hrs Systolic blood pressure 130 to 150 mmHg.   Continue aggressive diuresis with furosemide  80 mg IV bid 09/22 metolazone  2.5 mg  Will add SGLT 2 ing and spironolactone  to augment diuresis.   Acute hypoxemic respiratory failure due to acute cardiogenic pulmonary edema.  Responded well to diuresis.  Today 02 saturation is 90% on room air.  Continue supplemental 02 per [place] and as needed bipap.

## 2024-07-23 DIAGNOSIS — I340 Nonrheumatic mitral (valve) insufficiency: Secondary | ICD-10-CM | POA: Diagnosis not present

## 2024-07-23 DIAGNOSIS — I4891 Unspecified atrial fibrillation: Secondary | ICD-10-CM

## 2024-07-23 DIAGNOSIS — I330 Acute and subacute infective endocarditis: Secondary | ICD-10-CM | POA: Diagnosis not present

## 2024-07-23 DIAGNOSIS — I5033 Acute on chronic diastolic (congestive) heart failure: Secondary | ICD-10-CM | POA: Diagnosis not present

## 2024-07-23 DIAGNOSIS — N1831 Chronic kidney disease, stage 3a: Secondary | ICD-10-CM | POA: Diagnosis not present

## 2024-07-23 DIAGNOSIS — I2510 Atherosclerotic heart disease of native coronary artery without angina pectoris: Secondary | ICD-10-CM | POA: Diagnosis not present

## 2024-07-23 DIAGNOSIS — I480 Paroxysmal atrial fibrillation: Secondary | ICD-10-CM | POA: Diagnosis not present

## 2024-07-23 LAB — CBC
HCT: 40.3 % (ref 39.0–52.0)
Hemoglobin: 13.9 g/dL (ref 13.0–17.0)
MCH: 32 pg (ref 26.0–34.0)
MCHC: 34.5 g/dL (ref 30.0–36.0)
MCV: 92.9 fL (ref 80.0–100.0)
Platelets: 81 K/uL — ABNORMAL LOW (ref 150–400)
RBC: 4.34 MIL/uL (ref 4.22–5.81)
RDW: 13.7 % (ref 11.5–15.5)
WBC: 9.7 K/uL (ref 4.0–10.5)
nRBC: 0 % (ref 0.0–0.2)

## 2024-07-23 LAB — MAGNESIUM: Magnesium: 1.9 mg/dL (ref 1.7–2.4)

## 2024-07-23 LAB — COMPREHENSIVE METABOLIC PANEL WITH GFR
ALT: <5 U/L (ref 0–44)
AST: 32 U/L (ref 15–41)
Albumin: 3.6 g/dL (ref 3.5–5.0)
Alkaline Phosphatase: 57 U/L (ref 38–126)
Anion gap: 15 (ref 5–15)
BUN: 21 mg/dL (ref 8–23)
CO2: 25 mmol/L (ref 22–32)
Calcium: 8.9 mg/dL (ref 8.9–10.3)
Chloride: 98 mmol/L (ref 98–111)
Creatinine, Ser: 1.45 mg/dL — ABNORMAL HIGH (ref 0.61–1.24)
GFR, Estimated: 48 mL/min — ABNORMAL LOW (ref >60)
Glucose, Bld: 141 mg/dL — ABNORMAL HIGH (ref 70–99)
Potassium: 3 mmol/L — ABNORMAL LOW (ref 3.5–5.1)
Sodium: 138 mmol/L (ref 135–145)
Total Bilirubin: 1.3 mg/dL — ABNORMAL HIGH (ref 0.0–1.2)
Total Protein: 6.6 g/dL (ref 6.5–8.1)

## 2024-07-23 MED ORDER — POTASSIUM CHLORIDE CRYS ER 20 MEQ PO TBCR
40.0000 meq | EXTENDED_RELEASE_TABLET | ORAL | Status: AC
  Administered 2024-07-23 (×2): 40 meq via ORAL
  Filled 2024-07-23 (×2): qty 2

## 2024-07-23 MED ORDER — MAGNESIUM SULFATE 2 GM/50ML IV SOLN
2.0000 g | Freq: Once | INTRAVENOUS | Status: AC
  Administered 2024-07-23: 2 g via INTRAVENOUS
  Filled 2024-07-23: qty 50

## 2024-07-23 MED ORDER — METOPROLOL TARTRATE 25 MG PO TABS
25.0000 mg | ORAL_TABLET | Freq: Three times a day (TID) | ORAL | Status: DC
Start: 2024-07-23 — End: 2024-07-24
  Administered 2024-07-23 (×3): 25 mg via ORAL
  Filled 2024-07-23 (×3): qty 1

## 2024-07-23 MED ORDER — SPIRONOLACTONE 12.5 MG HALF TABLET
12.5000 mg | ORAL_TABLET | Freq: Every day | ORAL | Status: DC
  Administered 2024-07-23: 12.5 mg via ORAL
  Filled 2024-07-23: qty 1

## 2024-07-23 MED ORDER — LORATADINE 10 MG PO TABS
10.0000 mg | ORAL_TABLET | Freq: Every day | ORAL | Status: DC
  Administered 2024-07-23 – 2024-07-28 (×6): 10 mg via ORAL
  Filled 2024-07-23 (×7): qty 1

## 2024-07-23 MED ORDER — AMIODARONE LOAD VIA INFUSION
150.0000 mg | Freq: Once | INTRAVENOUS | Status: AC
  Administered 2024-07-23: 150 mg via INTRAVENOUS
  Filled 2024-07-23: qty 83.34

## 2024-07-23 MED ORDER — MELATONIN 3 MG PO TABS
3.0000 mg | ORAL_TABLET | Freq: Every evening | ORAL | Status: DC | PRN
  Administered 2024-07-23 – 2024-07-27 (×3): 3 mg via ORAL
  Filled 2024-07-23 (×4): qty 1

## 2024-07-23 MED ORDER — GUAIFENESIN ER 600 MG PO TB12
600.0000 mg | ORAL_TABLET | Freq: Two times a day (BID) | ORAL | Status: DC
Start: 2024-07-23 — End: 2024-07-28
  Administered 2024-07-23 – 2024-07-28 (×11): 600 mg via ORAL
  Filled 2024-07-23 (×11): qty 1

## 2024-07-23 MED ORDER — EMPAGLIFLOZIN 10 MG PO TABS
10.0000 mg | ORAL_TABLET | Freq: Every day | ORAL | Status: DC
Start: 2024-07-23 — End: 2024-07-28
  Administered 2024-07-23 – 2024-07-28 (×6): 10 mg via ORAL
  Filled 2024-07-23 (×6): qty 1

## 2024-07-23 NOTE — Evaluation (Signed)
 Physical Therapy Evaluation Patient Details Name: Philip [name] MRN: [id number] DOB: 1941/08/03 Today's Date: 07/23/2024  History of Present Illness  83 yo male adm 07/21/24 with SOB. 9/22 Rapid Afib with RVR. 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 Afib, CAD s/p CABG, PAD, OSA, AAA stenting, penile prothesis, HTN, HLD, skin CA  Clinical Impression  Pt pleasant and familiar from last admission. Philip [name] states he was doing well, participating in HHPT and walking without assist at home. Pt with HR 120-137 at rest with pt eager to get OOB and able to pivot to chair and stand briefly, limited by HR up to 158 requiring seated rest and even after 7 min rest rate remained at 140. With limited seated HEP HR again to 155 and terminated all activity. Pt aware of limitations of AFib with RVR currently and will continue to progress mobility as pt cardiopulmonary status allows. Educated for seated HEP, encouraged OOB to chair daily. SPO2 97% on 10L HFNC        If plan is discharge home, recommend the following: A little help with walking and/or transfers;A little help with bathing/dressing/bathroom;Assistance with cooking/housework;Assist for transportation   Can travel by private vehicle        Equipment Recommendations None recommended by PT  Recommendations for Other Services  OT consult    Functional Status Assessment Patient has had a recent decline in their functional status and demonstrates the ability to make significant improvements in function in a reasonable and predictable amount of time.     Precautions / Restrictions Precautions Precautions: Fall;Other (comment) Recall of Precautions/Restrictions: Intact Precaution/Restrictions Comments: watch HR      Mobility  Bed Mobility Overal bed mobility: Modified Independent                  Transfers Overall transfer level: Needs assistance   Transfers: Sit to/from  Stand, Bed to chair/wheelchair/BSC Sit to Stand: Supervision Stand pivot transfers: Supervision         General transfer comment: supervision for lines and to monitor HR. Pt able to stand for grossly 1 min and pivot to chair limited by HR up to 158 requiring sitting    Ambulation/Gait               General Gait Details: not currently able due to HR  Stairs            Wheelchair Mobility     Tilt Bed    Modified Rankin (Stroke Patients Only)       Balance Overall balance assessment: No apparent balance deficits (not formally assessed)                                           Pertinent Vitals/Pain Pain Assessment Pain Assessment: No/denies pain    Home Living Family/patient expects to be discharged to:: Private residence Living Arrangements: Spouse/significant other Available Help at Discharge: Family;Available 24 hours/day Type of Home: House Home Access: Stairs to enter Entrance Stairs-Rails: Can reach both Entrance Stairs-Number of Steps: 4 Alternate Level Stairs-Number of Steps: 12 Home Layout: Two level;Bed/bath upstairs;Able to live on main level with bedroom/bathroom Home Equipment: None      Prior Function Prior Level of Function : Independent/Modified Independent             Mobility Comments: walking without assist ADLs Comments: not  driving since recent CVA     Extremity/Trunk Assessment   Upper Extremity Assessment Upper Extremity Assessment: Overall WFL for tasks assessed    Lower Extremity Assessment Lower Extremity Assessment: Overall WFL for tasks assessed    Cervical / Trunk Assessment Cervical / Trunk Assessment: Normal  Communication   Communication Communication: No apparent difficulties    Cognition Arousal: Alert Behavior During Therapy: WFL for tasks assessed/performed   PT - Cognitive impairments: No apparent impairments                         Following commands: Intact        Cueing Cueing Techniques: Verbal cues     General Comments      Exercises General Exercises - Lower Extremity Long Arc Quad: AROM, Both, 15 reps, Seated, Strengthening Hip Flexion/Marching: AROM, Both, 5 reps, Seated, Strengthening   Assessment/Plan    PT Assessment Patient needs continued PT services  PT Problem List Decreased activity tolerance;Cardiopulmonary status limiting activity;Decreased mobility       PT Treatment Interventions Gait training;Functional mobility training;Stair training;Therapeutic activities;Therapeutic exercise;Patient/family education    PT Goals (Current goals can be found in the Care Plan section)  Acute Rehab PT Goals Patient Stated Goal: return home PT Goal Formulation: With patient/family Time For Goal Achievement: 08/06/24 Potential to Achieve Goals: Good    Frequency Min 2X/week     Co-evaluation               AM-PAC PT 6 Clicks Mobility  Outcome Measure Help needed turning from your back to your side while in a flat bed without using bedrails?: None Help needed moving from lying on your back to sitting on the side of a flat bed without using bedrails?: None Help needed moving to and from a bed to a chair (including a wheelchair)?: A Little Help needed standing up from a chair using your arms (e.g., wheelchair or bedside chair)?: A Little Help needed to walk in hospital room?: Total Help needed climbing 3-5 steps with a railing? : Total 6 Click Score: 16    End of Session Equipment Utilized During Treatment: Oxygen Activity Tolerance: Patient tolerated treatment well Patient left: in chair;with call bell/phone within reach;with family/visitor present;with chair alarm set Nurse Communication: Mobility status PT Visit Diagnosis: Other abnormalities of gait and mobility (R26.89);Difficulty in walking, not elsewhere classified (R26.2)    Time: 9069-9049 PT Time Calculation (min) (ACUTE ONLY): 20 min   Charges:   PT  Evaluation $PT Eval Moderate Complexity: 1 Mod   PT General Charges $$ ACUTE PT VISIT: 1 Visit         Philip [name], PT Acute Rehabilitation Services Office: [phone]   Philip [name] Philip [name] 07/23/2024, 11:16 AM

## 2024-07-23 NOTE — Progress Notes (Incomplete)
 PROGRESS NOTE    Philip [name]  FMW:[id number] DOB: 1941-07-18 DOA: 07/21/2024 PCP: [name], MD   83/M w MV endocarditis, hypertension, CKD, abdominal aortic aneurysm, who presented with dyspnea and orthopnea on day of admission. -labs, cr 1.44 , BNP 2,281  CXR w bilateral hilar vascular congestion, bilateral central interstitial infiltrates, predominant at the lower lobes.  -placed on IV furosemide  Developed respiratory distress and was placed on Bipap. -9/22 added IV amiodarone , increased diuresis. -9/23 improving volume status and heart rate, remains in afib  Subjective:   Assessment and Plan:  Acute on chronic diastolic CHF -Echo w EF 60 to 34%, 2.5 x 1.5 cm mobile vegetation attached to the atrial surface of the posterior leaflet of the mitral valve near the base of the leaflet. Positive mitral valve regurgitation.  -Continue aggressive diuresis with furosemide  80 mg IV bid 09/22 metolazone  2.5 mg  Will add SGLT 2 ing and spironolactone  to augment diuresis.   Acute hypoxemic respiratory failure due to acute cardiogenic pulmonary edema.  Responded well to diuresis.  Wean O2, PRN bipap.  Paroxysmal atrial fibrillation (HCC) Atrial flutter with variable block.  Heart rate has improved.  Continue IV amiodarone  for rate control.  Continue metoprolol  ,  apixaban   Essential hypertension Continue blood pressure monitoring Aggressive diuresis.  Will benefit from further afterload reduction when atrial flutter better controlled.   CAD (coronary artery disease) No chest pain, elevation high sensitive troponin due to tachycardia and heart failure No acute coronary syndrome Continue satin.  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  1.8 x 1.2 cm vegetation on mitral valve, 05/2024 Continue antibiotic therapy with Cefazolin  to complete on 07/31/2024. Follow up limited echocardiogram with apparent larger mitral valve vegetation.  Patient will  likely need TEE.   Chronic kidney disease, stage 3a (HCC) Hypokalemia  Continue diuresis with furosemide , SGLT 2 inh and spironolactone , sequential nephron blockade.   AAA (abdominal aortic aneurysm) Follow up as outpatient   Obesity (BMI 30.0-34.9) Calculated BMI is 33.2 consistent in obesity class 1   DVT prophylaxis: apixaban  Code Status: Full code Family Communication: Disposition Plan:   Consultants:    Procedures:   Antimicrobials:    Objective: Vitals:   07/23/24 0800 07/23/24 1111 07/23/24 1212 07/23/24 1605  BP:      Pulse:  (!) 158    Resp: 19  19 18   Temp: 98.3 F (36.8 C)  98.9 F (37.2 C) 98.6 F (37 C)  TempSrc: Oral  Oral Oral  SpO2:  97%    Weight:      Height:        Intake/Output Summary (Last 24 hours) at 07/23/2024 1704 Last data filed at 07/23/2024 1600 Gross per 24 hour  Intake 1648.42 ml  Output 2900 ml  Net -1251.58 ml   Filed Weights   07/21/24 1637 07/22/24 0415 07/23/24 0330  Weight: 101.2 kg 102 kg 98.7 kg    Examination:      Data Reviewed:   CBC: Recent Labs  Lab 07/21/24 1106 07/22/24 0309 07/23/24 0224  WBC 7.5 9.0 9.7  NEUTROABS 5.3 6.2  --   HGB 12.0* 13.5 13.9  HCT 34.9* 40.5 40.3  MCV 94.8 95.1 92.9  PLT 76* 79* 81*   Basic Metabolic Panel: Recent Labs  Lab 07/21/24 1106 07/22/24 0309 07/23/24 0224  NA 138 139 138  K 4.2 4.0 3.0*  CL 103 103 98  CO2 23 23 25   GLUCOSE 103* 119* 141*  BUN 21  20 21  CREATININE 1.44* 1.33* 1.45*  CALCIUM  9.3 9.0 8.9  MG  --  2.1 1.9  PHOS  --  3.7  --    GFR: Estimated Creatinine Clearance: 43.9 mL/min (A) (by C-G formula based on SCr of 1.45 mg/dL (H)). Liver Function Tests: Recent Labs  Lab 07/22/24 0309 07/23/24 0224  AST 26 32  ALT 6 <5  ALKPHOS 63 57  BILITOT 1.3* 1.3*  PROT 6.3* 6.6  ALBUMIN 3.7 3.6   No results for input(s): LIPASE, AMYLASE in the last 168 hours. No results for input(s): AMMONIA in the last 168 hours. Coagulation  Profile: No results for input(s): INR, PROTIME in the last 168 hours. Cardiac Enzymes: No results for input(s): CKTOTAL, CKMB, CKMBINDEX, TROPONINI in the last 168 hours. BNP (last 3 results) Recent Labs    07/21/24 1106  PROBNP 2,281.0*   HbA1C: No results for input(s): HGBA1C in the last 72 hours. CBG: No results for input(s): GLUCAP in the last 168 hours. Lipid Profile: No results for input(s): CHOL, HDL, LDLCALC, TRIG, CHOLHDL, LDLDIRECT in the last 72 hours. Thyroid  Function Tests: Recent Labs    07/22/24 0309 07/22/24 0830  TSH 7.337*  --   FREET4  --  0.92   Anemia Panel: No results for input(s): VITAMINB12, FOLATE, FERRITIN, TIBC, IRON, RETICCTPCT in the last 72 hours. Urine analysis:    Component Value Date/Time   COLORURINE YELLOW 10/06/2021 1523   APPEARANCEUR CLEAR 10/06/2021 1523   LABSPEC 1.010 10/06/2021 1523   PHURINE 7.0 10/06/2021 1523   GLUCOSEU NEGATIVE 10/06/2021 1523   GLUCOSEU NEGATIVE 04/24/2020 0847   HGBUR NEGATIVE 10/06/2021 1523   HGBUR negative 11/10/2008 1322   BILIRUBINUR NEGATIVE 10/06/2021 1523   KETONESUR NEGATIVE 10/06/2021 1523   PROTEINUR NEGATIVE 10/06/2021 1523   UROBILINOGEN 0.2 04/24/2020 0847   NITRITE NEGATIVE 10/06/2021 1523   LEUKOCYTESUR NEGATIVE 10/06/2021 1523   Sepsis Labs: @LABRCNTIP (procalcitonin:4,lacticidven:4)  ) Recent Results (from the past 240 hours)  Culture, blood (single)     Status: None   Collection Time: 07/14/24  7:30 PM   Specimen: BLOOD  Result Value Ref Range Status   Specimen Description BLOOD SITE NOT SPECIFIED  Final   Special Requests   Final    BOTTLES DRAWN AEROBIC AND ANAEROBIC Blood Culture adequate volume   Culture   Final    NO GROWTH 5 DAYS Performed at [facility], [address] [place], [place] [address]    Report Status 07/19/2024 FINAL  Final  Resp panel by RT-PCR (RSV, Flu A&B, Covid) Anterior Nasal Swab     Status: None    Collection Time: 07/21/24 11:06 AM   Specimen: Anterior Nasal Swab  Result Value Ref Range Status   SARS Coronavirus 2 by RT PCR NEGATIVE NEGATIVE Final    Comment: (NOTE) SARS-CoV-2 target nucleic acids are NOT DETECTED.  The SARS-CoV-2 RNA is generally detectable in upper respiratory specimens during the acute phase of infection. The lowest concentration of SARS-CoV-2 viral copies this assay can detect is 138 copies/mL. A negative result does not preclude SARS-Cov-2 infection and should not be used as the sole basis for treatment or other patient management decisions. A negative result may occur with  improper specimen collection/handling, submission of specimen other than nasopharyngeal swab, presence of viral mutation(s) within the areas targeted by this assay, and inadequate number of viral copies(<138 copies/mL). A negative result must be combined with clinical observations, patient history, and epidemiological information. The expected result is Negative.  Fact Sheet for Patients:  BloggerCourse.com  Fact Sheet for Healthcare Providers:  SeriousBroker.it  This test is no t yet approved or cleared by the [place]  FDA and  has been authorized for detection and/or diagnosis of SARS-CoV-2 by FDA under an Emergency Use Authorization (EUA). This EUA will remain  in effect (meaning this test can be used) for the duration of the COVID-19 declaration under Section 564(b)(1) of the Act, 21 U.S.C.section 360bbb-3(b)(1), unless the authorization is terminated  or revoked sooner.       Influenza A by PCR NEGATIVE NEGATIVE Final   Influenza B by PCR NEGATIVE NEGATIVE Final    Comment: (NOTE) The Xpert Xpress SARS-CoV-2/FLU/RSV plus assay is intended as an aid in the diagnosis of influenza from Nasopharyngeal swab specimens and should not be used as a sole basis for treatment. Nasal washings and aspirates are unacceptable for  Xpert Xpress SARS-CoV-2/FLU/RSV testing.  Fact Sheet for Patients: BloggerCourse.com  Fact Sheet for Healthcare Providers: SeriousBroker.it  This test is not yet approved or cleared by the [place]  FDA and has been authorized for detection and/or diagnosis of SARS-CoV-2 by FDA under an Emergency Use Authorization (EUA). This EUA will remain in effect (meaning this test can be used) for the duration of the COVID-19 declaration under Section 564(b)(1) of the Act, 21 U.S.C. section 360bbb-3(b)(1), unless the authorization is terminated or revoked.     Resp Syncytial Virus by PCR NEGATIVE NEGATIVE Final    Comment: (NOTE) Fact Sheet for Patients: BloggerCourse.com  Fact Sheet for Healthcare Providers: SeriousBroker.it  This test is not yet approved or cleared by the [place]  FDA and has been authorized for detection and/or diagnosis of SARS-CoV-2 by FDA under an Emergency Use Authorization (EUA). This EUA will remain in effect (meaning this test can be used) for the duration of the COVID-19 declaration under Section 564(b)(1) of the Act, 21 U.S.C. section 360bbb-3(b)(1), unless the authorization is terminated or revoked.  Performed at [facility], [address] [place], [place] [address]      Radiology Studies: ECHOCARDIOGRAM LIMITED Result Date: 07/22/2024    ECHOCARDIOGRAM LIMITED REPORT   Patient Name:   Philip [name] Date of Exam: 07/22/2024 Medical Rec #:  [id number]      Height:       69.0 in Accession #:    [id number]     Weight:       224.9 lb Date of Birth:  [birth year]      BSA:          2.172 m Patient Age:    83 years       BP:           139/93 mmHg Patient Gender: M              HR:           144 bpm. Exam Location:  Inpatient Procedure: Color Doppler and Limited Echo (Both Spectral and Color Flow Doppler            were utilized during procedure).  Indications:    Mitral Valve Disorder  History:        Patient has prior history of Echocardiogram examinations, most                 recent 06/24/2024.  Sonographer:    Tinnie Gosling RDCS Referring Phys: [id number] MAURICIO DANIEL ARRIEN IMPRESSIONS  1. Left ventricular ejection fraction, by estimation, is 60 to 65%. The left ventricle has  normal function. Left ventricular endocardial border not optimally defined to evaluate regional wall motion.  2. Left atrial size was mildly dilated.  3. 2.5 x 1.5 cm mobile vegetation attached to the atrial surface of the posterior leaflet of the mitral valve near the base of the leaflet. It prolapses across the leaflet. Mitral regurgitation is present but is not fully interrogated. Moderate mitral annular calcification.  4. There is mild calcification of the aortic valve.  5. The inferior vena cava is normal in size with greater than 50% respiratory variability, suggesting right atrial pressure of 3 mmHg.  6. Very limited echo. There is still a large mitral valve vegetation. The mitral regurgitation was not fully interrogated. The RV was not visualized. The aortic valve was not visualized. Would consider full echo or repeat TEE. FINDINGS  Left Ventricle: Left ventricular ejection fraction, by estimation, is 60 to 65%. The left ventricle has normal function. Left ventricular endocardial border not optimally defined to evaluate regional wall motion. The left ventricular internal cavity size was normal in size. There is no left ventricular hypertrophy. Left Atrium: Left atrial size was mildly dilated. Mitral Valve: 2.5 x 1.5 cm mobile vegetation attached to the atrial surface of the posterior leaflet of the mitral valve near the base of the leaflet. It prolapses across the leaflet. Mitral regurgitation is present but is not fully interrogated. Moderate mitral annular calcification. Aortic Valve: There is mild calcification of the aortic valve. Venous: The inferior vena cava is normal  in size with greater than 50% respiratory variability, suggesting right atrial pressure of 3 mmHg. IVC IVC diam: 1.40 cm Dalton McleanMD Electronically signed by Ezra Kanner Signature Date/Time: 07/22/2024/9:28:59 PM    Final      Scheduled Meds:  apixaban   5 mg Oral BID   atorvastatin   40 mg Oral Daily   Chlorhexidine  Gluconate Cloth  6 each Topical Daily   empagliflozin   10 mg Oral Daily   furosemide   80 mg Intravenous BID   guaiFENesin   600 mg Oral BID   Influenza vac split trivalent PF  0.5 mL Intramuscular Tomorrow-1000   loratadine   10 mg Oral Daily   magnesium  oxide  800 mg Oral Daily   metoprolol  tartrate  25 mg Oral TID   sodium chloride  flush  10-40 mL Intracatheter Q12H   sodium chloride  flush  3 mL Intravenous Q12H   spironolactone   12.5 mg Oral Daily   Continuous Infusions:  amiodarone  30 mg/hr (07/23/24 0826)    ceFAZolin  (ANCEF ) IV 2 g (07/23/24 1218)     LOS: 2 days    Time spent:  [name] Pac, MD [facility]   07/23/2024, 5:04 PM

## 2024-07-23 NOTE — Evaluation (Signed)
 Occupational Therapy Evaluation Patient Details Name: Philip [name] MRN: [id number] DOB: 1941-10-19 Today's Date: 07/23/2024   History of Present Illness   83 yo male adm 07/21/24 with SOB. 9/22 Rapid Afib with RVR. 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 Afib, CAD s/p CABG, PAD, OSA, AAA stenting, penile prothesis, HTN, HLD, skin CA     Clinical Impressions PT admitted with SOB with Afib with RVR. Pt currently with functional limitiations due to the deficits listed below (see OT problem list). Pt at this time DOE 3 out 4 with mobility and talking. Pt educated and demonstrates pursed lip breathing. Discussed energy conservation for showering and water temperature. Pt with poor wave form but appears to be 88% at lowest on RA during session. Ot to further monitor O2 with adls.  Pt will benefit from skilled OT to increase their independence and safety with adls and balance to allow discharge hhot.      If plan is discharge home, recommend the following:   A little help with walking and/or transfers;A little help with bathing/dressing/bathroom     Functional Status Assessment   Patient has had a recent decline in their functional status and demonstrates the ability to make significant improvements in function in a reasonable and predictable amount of time.     Equipment Recommendations   None recommended by OT     Recommendations for Other Services         Precautions/Restrictions   Precautions Precautions: Fall;Other (comment) Recall of Precautions/Restrictions: Intact Precaution/Restrictions Comments: watch HR Restrictions Weight Bearing Restrictions Per Provider Order: No     Mobility Bed Mobility               General bed mobility comments: oob in chair    Transfers Overall transfer level: Needs assistance   Transfers: Sit to/from Stand, Bed to chair/wheelchair/BSC Sit to Stand:  Supervision Stand pivot transfers: Supervision                Balance Overall balance assessment: No apparent balance deficits (not formally assessed)                                         ADL either performed or assessed with clinical judgement   ADL Overall ADL's : Needs assistance/impaired Eating/Feeding: Modified independent   Grooming: Wash/dry face   Upper Body Bathing: Contact guard assist   Lower Body Bathing: Contact guard assist           Toilet Transfer: Contact guard assist           Functional mobility during ADLs: Contact guard assist General ADL Comments: DOE 3 out 4 with talking and walking. pt with pursed lip breathing able to rebound poor read but 88% when returned.     Vision   Vision Assessment?: No apparent visual deficits     Perception         Praxis         Pertinent Vitals/Pain       Extremity/Trunk Assessment Upper Extremity Assessment Upper Extremity Assessment: Overall WFL for tasks assessed   Lower Extremity Assessment Lower Extremity Assessment: Overall WFL for tasks assessed   Cervical / Trunk Assessment Cervical / Trunk Assessment: Normal   Communication Communication Communication: No apparent difficulties   Cognition Arousal: Alert Behavior During Therapy: WFL for tasks assessed/performed Cognition: No apparent  impairments             OT - Cognition Comments: cognition much improved from prior admission. pt joking showing more personality                 Following commands: Intact       Cueing  General Comments   Cueing Techniques: Verbal cues  02 91-88% RA   Exercises     Shoulder Instructions      Home Living Family/patient expects to be discharged to:: Private residence Living Arrangements: Spouse/significant other Available Help at Discharge: Family;Available 24 hours/day Type of Home: House Home Access: Stairs to enter [employer] of Steps:  4 Entrance Stairs-Rails: Can reach both Home Layout: Two level;Bed/bath upstairs;Able to live on main level with bedroom/bathroom Alternate Level Stairs-Number of Steps: 12   Bathroom Shower/Tub: Producer, television/film/video: Standard     Home Equipment: None   Additional Comments: goes to gym 3 days week lifting      Prior Functioning/Environment Prior Level of Function : Independent/Modified Independent             Mobility Comments: walking without assist ADLs Comments: not driving since recent CVA    OT Problem List: Decreased strength;Impaired balance (sitting and/or standing);Decreased activity tolerance;Cardiopulmonary status limiting activity   OT Treatment/Interventions: Self-care/ADL training;Energy conservation;DME and/or AE instruction;Therapeutic activities;Patient/family education;Balance training      OT Goals(Current goals can be found in the care plan section)   Acute Rehab OT Goals Patient Stated Goal: to go home OT Goal Formulation: With patient/family Time For Goal Achievement: 08/06/24 Potential to Achieve Goals: Good   OT Frequency:  Min 2X/week    Co-evaluation              AM-PAC OT 6 Clicks Daily Activity     Outcome Measure Help from another person eating meals?: None Help from another person taking care of personal grooming?: None Help from another person toileting, which includes using toliet, bedpan, or urinal?: A Little Help from another person bathing (including washing, rinsing, drying)?: A Little Help from another person to put on and taking off regular upper body clothing?: None Help from another person to put on and taking off regular lower body clothing?: A Little 6 Click Score: 21   End of Session Equipment Utilized During Treatment: Gait belt Nurse Communication: Mobility status;Precautions  Activity Tolerance: Patient tolerated treatment well Patient left: in bed;with call bell/phone within reach;with  family/visitor present  OT Visit Diagnosis: Unsteadiness on feet (R26.81);Muscle weakness (generalized) (M62.81)                Time: 8590-8571 OT Time Calculation (min): 19 min Charges:  OT General Charges $OT Visit: 1 Visit OT Evaluation $OT Eval Moderate Complexity: 1 Mod   Philip [name], OTR/L  Acute Rehabilitation Services Office: [phone] .   [name] 07/23/2024, 3:43 PM

## 2024-07-23 NOTE — Progress Notes (Signed)
  Progress Note  Patient Name: Philip [name] Date of Encounter: 07/23/2024 [facility] Cardiologist: Dorn Lesches, MD   Interval Summary    Looks and feels much better today. Off Bipap this morning on HFNC @15L . HR remains elevated between 110-140s.   Vital Signs Vitals:   07/23/24 0330 07/23/24 0343 07/23/24 0400 07/23/24 0800  BP: 119/80  136/89   Pulse: (!) 115  (!) 109   Resp: 20  20 19   Temp:   98.4 F (36.9 C) 98.3 F (36.8 C)  TempSrc:   Oral Oral  SpO2: 95% 95% 97%   Weight: 98.7 kg     Height:        Intake/Output Summary (Last 24 hours) at 07/23/2024 0856 Last data filed at 07/23/2024 0800 Gross per 24 hour  Intake 1347.63 ml  Output 1900 ml  Net -552.37 ml      07/23/2024    3:30 AM 07/22/2024    4:15 AM 07/21/2024    4:37 PM  Last 3 Weights  Weight (lbs) 217 lb 8 oz 224 lb 13.9 oz 223 lb 1.7 oz  Weight (kg) 98.657 kg 102 kg 101.2 kg      Telemetry/ECG  Atrial flutter rates 100-140s - Personally Reviewed  Physical Exam  GEN: on HFNC @15L  Neck: No JVD Cardiac: Tachycardic, + systolic murmur, no rubs, or gallops.  Respiratory: Diminished in bases GI: Soft, nontender, non-distended  MS: 1+ LE edema  Assessment & Plan   83 yo male with PMH of CAD s/p CABG '99, HTN, HLD, AAA s/p stenting ([facility], 2015), PAF on eliquis , CKD III  who presented worsening shortness of breath.    Acute decompensated HFpEF -- Presented with acute volume overload, bibasilar edema noted on chest x-ray with elevated proBNP -- Echocardiogram 06/18/2024 LVEF of 50%, RV not well-visualized, moderate left atrial enlargement, mild mitral stenosis. -- on IV Lasix  80 mg twice daily, net -2.4 L, weight down 224>>217.5lbs -- echo 9/22 with LVEF of 60-65%, RV not well visualized -- weaned from Bipap on HFNC @15L , feels better today -- continue IV lasix  80mg  BID    Atrial fibrillation/atrial flutter with RVR  -- rates remain elevated overnight and this morning -- continue IV  amiodarone , received 150mg  bolus x1 at 0330 -- resume metoprolol  25mg  increase to TID for rate control   Recent diagnosis of mitral valve endocarditis/MSSA -- On IV antibiotics per ID -- TEE 8/25 with large mobile vegetation of 1.9 x 1.2 cm attached to atrial aspect of mitral valve with evidence of small leaflet perforation at site of vegetation with trivial to mild additional jet MR at site of perforation.  MR mild to moderate with mild mitral stenosis -- limited echo 9/22 2.5 x 1.5 cm mobile vegetation on atrial surface of posterior leaflet of mitral valve    HTN -- Blood pressure initially elevated this morning, suspect compensatory -- Currently on metoprolol  25 mg twice daily, hold for now   Hyperlipidemia -- On atorvastatin  40 mg daily PTA   CAD status post CABG '99 -- Most recent cath 2022 with patent LIMA to LAD, SVG to OM, SVG to PDA -- No aspirin  with need for DOAC   CKD III -- Cr 1.44>>1.33>>1.45  Hypokalemia -- K+ 3.0, suppl  Elevated TSH -- TSH 7.337, T4 0.92 -- per primary  For questions or updates, please contact [facility] Please consult [email or web address] for contact info under   Signed, Manuelita Rummer, NP

## 2024-07-23 NOTE — Progress Notes (Addendum)
 MEWS Progress Note  Patient Details Name: Philip [name] MRN: [id number] DOB: Mar 08, 1941 Today's Date: 07/23/2024   MEWS Flowsheet Documentation:  Assess: MEWS Score Temp: 98.3 F (36.8 C) BP: 136/89 MAP (mmHg): 101 Pulse Rate: (!) 158 ECG Heart Rate: (!) 116 Resp: 19 Level of Consciousness: Alert SpO2: 97 % O2 Device: High Flow Nasal Cannula Patient Activity (if Appropriate): In chair O2 Flow Rate (L/min): 10 L/min Assess: MEWS Score MEWS Temp: 0 MEWS Systolic: 0 MEWS Pulse: 3 MEWS RR: 0 MEWS LOC: 0 MEWS Score: 3 MEWS Score Color: Yellow Assess: SIRS CRITERIA SIRS Temperature : 0 SIRS Respirations : 0 SIRS Pulse: 1 SIRS WBC: 0 SIRS Score Sum : 1 SIRS Temperature : 0 SIRS Pulse: 1 SIRS Respirations : 0 SIRS WBC: 0 SIRS Score Sum : 1 Assess: if the MEWS score is Yellow or Red Were vital signs accurate and taken at a resting state?: Yes Does the patient meet 2 or more of the SIRS criteria?: No MEWS guidelines implemented : No, previously yellow, continue vital signs every 4 hours        Laneta JAYSON Rao 07/23/2024, 11:28 AM

## 2024-07-23 NOTE — Progress Notes (Addendum)
 Progress Note   Patient: Philip [name] FMW:[id number] DOB: [birth year] DOA: 07/21/2024     2 DOS: the patient was seen and examined on 07/23/2024   Brief hospital course: Mr. Adachi was admitted to the hospital with the working diagnosis of heart failure exacerbation in the setting of atrial fibrillation with RVR.   83 yo male with the past medical history of mitral valve infective endocarditis, hypertension, CKD, abdominal aortic aneurysm, who presented with dyspnea and congestion. Reported 24 hrs of worsening dyspnea associated with orthopnea. On the day of admission he woke up at 300 am with severe dyspnea, prompting him to come to the ED. On his initial physical examination his blood pressure 125/85. HR 104 rr 20 and 02 saturation 91%  Lungs with bilateral diffuse rales and rhonchi, heart with S1 and S2 present, irregularly irregular, systolic murmur at the left lower sternal border, abdomen with no distention, and positive lower extremity edema +++  Na 138, K 4.2 CL 103 bicarbonate 23, glucose 103 bun 21 cr 1.44  BNP 2,281  Wbc 7.6 hgb 12.0 plt 76  Sars covid 19 negative RSV negative  Influenza negative   Chest radiograph with cardiomegaly, bilateral hilar vascular congestion, bilateral central interstitial infiltrates, predominant at the lower lobes.   #1 EKG 77 bpm, normal axis, normal intervals, qtc 457, sinus rhythm with bilateral atrial enlargement, no significant ST segment or T wave changes.  #2 EKG 115 bpm, left axis deviation, qtc 473, atrial flutter with variable block, 1:1 and 2:1, with no significant ST segment or  T wave changes.   Patient was placed on IV furosemide  Developed respiratory distress and was placed on Bipap. 09/22 added IV amiodarone , increased diuresis. 09/23 improved volume status and heart rate, continue atrial fibrillation.   Assessment and Plan: * Acute on chronic diastolic CHF (congestive heart failure) (HCC) Echocardiogram with preserved LV  systolic function EF 60 to 65%, LA with mild dilatation, 2.5 x 1.5 cm mobile vegetation attached to the atrial surface of the posterior leaflet of the mitral valve near the base of the leaflet. It prolapses across the leaflet. Positive mitral valve regurgitation.   Urine output documented documented at 1500 ml but weight loss has been 4 Kg over the last 24 hrs Systolic blood pressure 130 to 150 mmHg.   Continue aggressive diuresis with furosemide  80 mg IV bid 09/22 metolazone  2.5 mg  Will add SGLT 2 ing and spironolactone  to augment diuresis.   Acute hypoxemic respiratory failure due to acute cardiogenic pulmonary edema.  Responded well to diuresis.  Today 02 saturation is 90% on room air.  Continue supplemental 02 per Hernando and as needed bipap.  Paroxysmal atrial fibrillation (HCC) Atrial flutter with variable block.   Heart rate has improved.  Continue IV amiodarone  for rate control.  Continue metoprolol  25 mg po tid.  Anticoagulation with apixaban   Continue telemetry monitoring Keep K at 4 and Mg at 2  Essential hypertension Continue blood pressure monitoring Aggressive diuresis.  Will benefit from further afterload reduction when atrial flutter better controlled.   CAD (coronary artery disease) No chest pain, elevation high sensitive troponin due to tachycardia and heart failure No acute coronary syndrome Continue satin.  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  1.8 x 1.2 cm vegetation on mitral valve, 05/2024 Continue antibiotic therapy with Cefazolin  to complete on 07/31/2024.  Follow up limited echocardiogram with apparent larger mitral valve vegetation.  Patient will likely need TEE.    Chronic kidney disease,  stage 3a (HCC) Hypokalemia   Renal function with serum cr at 1,45 with K at 3,0 and serum bicarbonate at 25  Na 138 and Mg 1,9   Continue K correction with Kcl 40 meq x2 and 2 g mag sulfate Follow up renal function and  electrolytes in am Continue diuresis with furosemide , SGLT 2 inh and spironolactone , sequential nephron blockade.   AAA (abdominal aortic aneurysm) Follow up as outpatient   Obesity (BMI 30.0-34.9) Calculated BMI is 33.2 consistent in obesity class 1     Subjective: Patient had significant improvement in his symptoms, no chest pain, dyspnea and edema are improving, today he was sitting in the chiar   Physical Exam: Vitals:   07/23/24 0800 07/23/24 1111 07/23/24 1212 07/23/24 1605  BP:      Pulse:  (!) 158    Resp: 19  19 18   Temp: 98.3 F (36.8 C)  98.9 F (37.2 C) 98.6 F (37 C)  TempSrc: Oral  Oral Oral  SpO2:  97%    Weight:      Height:       Neurology awake and alert ENT with mild pallor Cardiovascular with S1 and S2 present, irregularly irregular with no gallops, or rubs, positive systolic murmur at the apex Mild JVD Respiratory with bilateral rales at bases with no wheezing or rhonchi  Abdomen protuberant, non tender and not distended Positive lower extremity edema ++  Data Reviewed:    Family Communication: I spoke with patient's son at the bedside, we talked in detail about patient's condition, plan of care and prognosis and all questions were addressed.   Disposition: Status is: Inpatient Remains inpatient appropriate because: recovering heart failure   Planned Discharge Destination: Home     Author: Elidia Toribio Furnace, MD 07/23/2024 4:44 PM  For on call review [email or web address].

## 2024-07-23 NOTE — TOC CM/SW Note (Signed)
 Transition of Care [facility]) - Inpatient Brief Assessment   Patient Details  Name: Philip [name] MRN: [id number] Date of Birth: 08-19-1941  [facility] CM/SW Contact:    Waddell Barnie Rama, RN Phone Number: 07/23/2024, 11:10 AM   Clinical Narrative: From home with spouse, has PCP and insurance on file, states has  [facility] services in place with [facility] for [facility] for IV abx and HHPT, this NCM confirmed with Darleene, he has has walker /cane, cpap machine  at home.  States family member (wife)  will transport them home at [employer] and family is support system, states gets medications from [place] on [address].  Pta self ambulatory.  NCM notified Darleene Fries.  ICM will continue to follow.    Transition of Care Asessment: Insurance and Status: Insurance coverage has been reviewed Patient has primary care physician: Yes Home environment has been reviewed: home with wife Prior level of function:: indep Prior/Current Home Services: Current home services (active with [facility] for [facility], HHPT, has walker /cane, cpap machine) Social Drivers of Health Review: SDOH reviewed no interventions necessary Readmission risk has been reviewed: Yes Transition of care needs: transition of care needs identified, TOC will continue to follow

## 2024-07-24 DIAGNOSIS — I340 Nonrheumatic mitral (valve) insufficiency: Secondary | ICD-10-CM

## 2024-07-24 DIAGNOSIS — I5033 Acute on chronic diastolic (congestive) heart failure: Secondary | ICD-10-CM | POA: Diagnosis not present

## 2024-07-24 DIAGNOSIS — I330 Acute and subacute infective endocarditis: Secondary | ICD-10-CM | POA: Diagnosis not present

## 2024-07-24 DIAGNOSIS — I339 Acute and subacute endocarditis, unspecified: Secondary | ICD-10-CM | POA: Diagnosis not present

## 2024-07-24 DIAGNOSIS — R7881 Bacteremia: Secondary | ICD-10-CM

## 2024-07-24 DIAGNOSIS — I480 Paroxysmal atrial fibrillation: Secondary | ICD-10-CM | POA: Diagnosis not present

## 2024-07-24 DIAGNOSIS — B9561 Methicillin susceptible Staphylococcus aureus infection as the cause of diseases classified elsewhere: Secondary | ICD-10-CM

## 2024-07-24 DIAGNOSIS — I4891 Unspecified atrial fibrillation: Secondary | ICD-10-CM | POA: Diagnosis not present

## 2024-07-24 DIAGNOSIS — J810 Acute pulmonary edema: Secondary | ICD-10-CM

## 2024-07-24 LAB — BASIC METABOLIC PANEL WITH GFR
Anion gap: 13 (ref 5–15)
Anion gap: 14 (ref 5–15)
BUN: 29 mg/dL — ABNORMAL HIGH (ref 8–23)
BUN: 30 mg/dL — ABNORMAL HIGH (ref 8–23)
CO2: 27 mmol/L (ref 22–32)
CO2: 26 mmol/L (ref 22–32)
Calcium: 8.7 mg/dL — ABNORMAL LOW (ref 8.9–10.3)
Calcium: 8.7 mg/dL — ABNORMAL LOW (ref 8.9–10.3)
Chloride: 95 mmol/L — ABNORMAL LOW (ref 98–111)
Chloride: 95 mmol/L — ABNORMAL LOW (ref 98–111)
Creatinine, Ser: 1.59 mg/dL — ABNORMAL HIGH (ref 0.61–1.24)
Creatinine, Ser: 1.54 mg/dL — ABNORMAL HIGH (ref 0.61–1.24)
GFR, Estimated: 43 mL/min — ABNORMAL LOW (ref >60)
GFR, Estimated: 44 mL/min — ABNORMAL LOW (ref >60)
Glucose, Bld: 111 mg/dL — ABNORMAL HIGH (ref 70–99)
Glucose, Bld: 161 mg/dL — ABNORMAL HIGH (ref 70–99)
Potassium: 2.8 mmol/L — ABNORMAL LOW (ref 3.5–5.1)
Potassium: 3.3 mmol/L — ABNORMAL LOW (ref 3.5–5.1)
Sodium: 135 mmol/L (ref 135–145)
Sodium: 135 mmol/L (ref 135–145)

## 2024-07-24 LAB — MAGNESIUM: Magnesium: 2.1 mg/dL (ref 1.7–2.4)

## 2024-07-24 MED ORDER — POTASSIUM CHLORIDE CRYS ER 20 MEQ PO TBCR
40.0000 meq | EXTENDED_RELEASE_TABLET | Freq: Once | ORAL | Status: AC
  Administered 2024-07-24: 40 meq via ORAL
  Filled 2024-07-24: qty 2

## 2024-07-24 MED ORDER — POTASSIUM CHLORIDE 10 MEQ/100ML IV SOLN
10.0000 meq | INTRAVENOUS | Status: AC
  Administered 2024-07-24 (×2): 10 meq via INTRAVENOUS
  Filled 2024-07-24 (×2): qty 100

## 2024-07-24 MED ORDER — POTASSIUM CHLORIDE 20 MEQ PO PACK
40.0000 meq | PACK | Freq: Once | ORAL | Status: AC
  Administered 2024-07-24: 40 meq via ORAL
  Filled 2024-07-24: qty 2

## 2024-07-24 MED ORDER — POTASSIUM CHLORIDE CRYS ER 20 MEQ PO TBCR: 40.0000 meq | EXTENDED_RELEASE_TABLET | Freq: Once | ORAL | Status: DC

## 2024-07-24 MED ORDER — POTASSIUM CHLORIDE CRYS ER 20 MEQ PO TBCR: 40.0000 meq | EXTENDED_RELEASE_TABLET | ORAL | Status: DC

## 2024-07-24 MED ORDER — METOPROLOL TARTRATE 25 MG PO TABS
37.5000 mg | ORAL_TABLET | Freq: Two times a day (BID) | ORAL | Status: DC
  Administered 2024-07-24 – 2024-07-26 (×4): 37.5 mg via ORAL
  Filled 2024-07-24 (×4): qty 1

## 2024-07-24 MED ORDER — SPIRONOLACTONE 25 MG PO TABS
25.0000 mg | ORAL_TABLET | Freq: Every day | ORAL | Status: DC
  Administered 2024-07-24 – 2024-07-28 (×5): 25 mg via ORAL
  Filled 2024-07-24 (×5): qty 1

## 2024-07-24 MED ORDER — POTASSIUM CHLORIDE CRYS ER 20 MEQ PO TBCR
20.0000 meq | EXTENDED_RELEASE_TABLET | Freq: Once | ORAL | Status: AC
Start: 2024-07-24 — End: 2024-07-24
  Administered 2024-07-24: 20 meq via ORAL
  Filled 2024-07-24: qty 1

## 2024-07-24 MED ORDER — POTASSIUM CHLORIDE 10 MEQ/100ML IV SOLN
10.0000 meq | INTRAVENOUS | Status: AC
  Administered 2024-07-24 (×3): 10 meq via INTRAVENOUS
  Filled 2024-07-24 (×2): qty 100

## 2024-07-24 NOTE — Progress Notes (Signed)
 Progress Note  Patient Name: Philip [name] Date of Encounter: 07/24/2024 [facility] Cardiologist: Dorn Lesches, MD   Interval Summary    Looks much better today and feels better overall. Breathing with significant improvement. Converted to SR overnight and maintaining.   Vital Signs Vitals:   07/24/24 0455 07/24/24 0500 07/24/24 0754 07/24/24 0800  BP: (!) 144/83  (!) 149/87 (!) 149/87  Pulse: 87  90 91  Resp: 16  18 20   Temp: 97.8 F (36.6 C)  98 F (36.7 C) 98.2 F (36.8 C)  TempSrc: Oral  Oral Oral  SpO2: 93%  92% 95%  Weight:  96.9 kg    Height:        Intake/Output Summary (Last 24 hours) at 07/24/2024 0952 Last data filed at 07/24/2024 0800 Gross per 24 hour  Intake 1249.7 ml  Output 2650 ml  Net -1400.3 ml      07/24/2024    5:00 AM 07/23/2024    3:30 AM 07/22/2024    4:15 AM  Last 3 Weights  Weight (lbs) 213 lb 10 oz 217 lb 8 oz 224 lb 13.9 oz  Weight (kg) 96.9 kg 98.657 kg 102 kg      Telemetry/ECG  Converted to sinus rhythm around 2000 last evening and maintaining this morning - Personally Reviewed  Physical Exam  GEN: No acute distress, down to 2L [place]   Neck: No JVD Cardiac: RRR, 2/6 systolic murmur, no rubs, or gallops.  Respiratory: Clear to auscultation bilaterally. 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    Acute decompensated HFpEF -- Presented with acute volume overload, bibasilar edema noted on chest x-ray with elevated proBNP -- Echocardiogram 06/18/2024 LVEF of 50%, RV not well-visualized, moderate left atrial enlargement, mild mitral stenosis. 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  MR mild to moderate with mild mitral stenosis -- limited echo 9/22 2.5 x 1.5 cm mobile vegetation on atrial surface of posterior leaflet of mitral valve  -- will plan for TEE tomorrow to better assessment MR   HTN -- metoprolol  37.5mg  BID, spiro 25mg  daily    Hyperlipidemia -- On atorvastatin  40 mg daily    CAD status post CABG '99 -- Most recent cath 2022 with patent LIMA to LAD, SVG to OM, SVG to PDA -- No aspirin  with need for DOAC   CKD III -- Cr 1.44>>1.33>>1.45>>1.59   Hypokalemia -- K+ 2.8, suppl   Elevated TSH --  TSH 7.337, T4 0.92 -- per primary  For questions or updates, please contact [facility] Please consult [email or web address] for contact info under   Signed, Manuelita Rummer, NP

## 2024-07-24 NOTE — Progress Notes (Signed)
 Mobility Specialist Progress Note:   07/24/24 1145  Mobility  Activity Ambulated with assistance  Level of Assistance Contact guard assist, steadying assist  Assistive Device None  Distance Ambulated (ft) 250 ft  Activity Response Tolerated well  Mobility Referral Yes  Mobility visit 1 Mobility  Mobility Specialist Start Time (ACUTE ONLY) 1145  Mobility Specialist Stop Time (ACUTE ONLY) 1200  Mobility Specialist Time Calculation (min) (ACUTE ONLY) 15 min   Pt agreeable to mobility session. Required only minG assist during ambulation with no AD use. VSS on RA, SpO2 89-92% (with good pleth). Mild SOB noted. HR stable throughout. Pt back in chair with all needs met.   Therisa Rana Mobility Specialist Please contact via SecureChat or  Rehab office at [phone]

## 2024-07-24 NOTE — Consult Note (Signed)
 [facility]    Date of Admission:  07/21/2024     Total days of antibiotics 4               Reason for Consult: Endocarditis   Referring Provider: Dr. [name] Pac Primary Care Provider: [name], MD   ASSESSMENT:  Mr. Goettl is an 83 year old Caucasian male recently admitted to the hospital in August 2025 found to have MSSA bacteremia complicated by mitral valve endocarditis.  No surgical intervention recommended secondary to risk factors with plan for IV cefazolin  through 07/31/2024.  Now presenting with acute onset shortness of breath and found to have acute on chronic heart failure and acute hypoxemic respiratory failure secondary to acute cardiogenic pulmonary edema.  Mr. Lerew continues to tolerate cefazolin  with no adverse side effects with PICC line functioning appropriately since replacement.  Discussed findings of TTE and anticipated course with mitral valve endocarditis.  TEE planned for tomorrow.  Recommend continuing current dose of cefazolin  through 07/31/2024 with plan and can consider transition to cefadroxil for an additional 2 weeks.  Will await TEE results for any additional treatment recommendations although expressed without surgery the vegetation will be present at the end of treatment and only way to tell if infection is cleared is to stop antibiotics and monitor for recurrence. Continue PICC line care per protocol. Standard/universal precautions. Remaining medical and supportive care per Internal Medicine.   PLAN:  Continue current dose of cefazolin . Await results of TEE for further recommendations. PICC line care per protocol. Standard/universal precautions. Remaining medical and supportive care per internal medicine.   Principal Problem:   Acute on chronic diastolic CHF (congestive heart failure) (HCC) Active Problems:   Essential hypertension   Paroxysmal atrial fibrillation (HCC)   Obesity (BMI 30.0-34.9)   CAD (coronary  artery disease)   AAA (abdominal aortic aneurysm)   Chronic kidney disease, stage 3a (HCC)   Infective endocarditis of mitral valve   Atrial fibrillation with rapid ventricular response (HCC)    apixaban   5 mg Oral BID   atorvastatin   40 mg Oral Daily   Chlorhexidine  Gluconate Cloth  6 each Topical Daily   empagliflozin   10 mg Oral Daily   guaiFENesin   600 mg Oral BID   Influenza vac split trivalent PF  0.5 mL Intramuscular Tomorrow-1000   loratadine   10 mg Oral Daily   magnesium  oxide  800 mg Oral Daily   metoprolol  tartrate  37.5 mg Oral BID   sodium chloride  flush  10-40 mL Intracatheter Q12H   sodium chloride  flush  3 mL Intravenous Q12H   spironolactone   25 mg Oral Daily     HPI: Philip [name] is a 83 y.o. male with previous medical history of sleep apnea, hypertension, heart failure, melanoma, and recent history of MSSA infection presenting to the ED with acute onset shortness of breath.  Mr. Dais is known to the ID service having last been seen on 07/09/2024 for hospitalization follow-up for MSSA bacteremia and mitral valve endocarditis.  Evaluated by CVTS and poor candidate for surgical intervention given multiple risk factors with plan for 6 weeks of IV antibiotics with cefazolin .  Last seen on 07/09/2024 for hospitalization follow-up with good adherence and tolerance to cefazolin  and PICC line functioning appropriately.  End date of treatment scheduled for 07/31/2024.    Mr. Gulley now presents to the hospital with acute onset shortness of breath.  Afebrile with no leukocytosis.  Chest x-ray with diffuse interstitial basilar  airspace disease suggesting edema.  Found to have acute on chronic diastolic congestive heart failure and acute hypoxic respiratory failure secondary to acute cardiogenic pulmonary edema.  This has improved with diuresis.  TTE performed with mobile vegetation attached to the atrial surface of the posterior leaflet of the mitral valve appearing slightly larger than  previous TEE.  Now scheduled for TEE tomorrow.  PICC line remains in place in right upper extremity.  ID has been consulted for antibiotic recommendations.  Mr. Stefanski continues to feel better.  Tolerating antibiotics with no adverse side effects.  Able to walk around without shortness of breath.  Wife at bedside.    Review of Systems: Review of Systems  Constitutional:  Negative for chills, fever and weight loss.  Respiratory:  Negative for cough, shortness of breath and wheezing.   Cardiovascular:  Negative for chest pain and leg swelling.  Gastrointestinal:  Negative for abdominal pain, constipation, diarrhea, nausea and vomiting.  Skin:  Negative for rash.     Past Medical History:  Diagnosis Date   AAA (abdominal aortic aneurysm)    Arthritis    Atrial fibrillation (HCC)    CAD (coronary artery disease)    had a MI , s/p CABG   Cataracts, bilateral    immature   CHF (congestive heart failure) (HCC)    Dysrhythmia    paroximal a fib   GERD (gastroesophageal reflux disease)    takes Omeprazole  daily   History of colon polyps    History of kidney stones    History of shingles    Hyperlipidemia    Hypertension    takes Amlodipine ,Metoprolol ,and Lisinopril  daily   Joint pain    Joint swelling    Myocardial infarction (HCC)    several with last one being 2001(but never knew about any except 2001)   OSA (obstructive sleep apnea)    started CPAP 12/09   Peripheral vascular disease    Peyronie's disease    s/p penile implant in 2006   Pneumonia    as a child   Skin cancer    several , one of them was melanoma (sees derm routinely)   Sleep apnea    Tingling    feet occasionally   Tinnitus     Social History   Tobacco Use   Smoking status: Former    Current packs/day: 0.00    Types: Cigarettes    Quit date: 1979    Years since quitting: 46.7   Smokeless tobacco: Never   Tobacco comments:    quit smoking in 1979  Vaping Use   Vaping status: Never Used   Substance Use Topics   Alcohol use: Yes    Comment: occasionally - once per week   Drug use: No    Family History  Problem Relation Age of Onset   Prostate cancer Father 62   Heart disease Father    Skin cancer Father    Heart attack Father    Cancer Father    Deep vein thrombosis Father    Hyperlipidemia Father    Hypertension Father    Heart disease Mother    Skin cancer Mother    Heart attack Mother    Cancer Mother    Hyperlipidemia Mother    Hypertension Mother    Varicose Veins Mother    Peripheral vascular disease Mother        amputation   Sleep apnea Brother    Hyperlipidemia Brother    Hypertension Brother  Prostate cancer Brother 26   Colon cancer Other        aunt   Skin cancer Brother    Skin cancer Sister    Cancer Sister    Heart disease Sister    Diabetes Neg Hx    Stroke Neg Hx     No Known Allergies  OBJECTIVE: Blood pressure (!) 149/87, pulse 91, temperature 98.2 F (36.8 C), temperature source Oral, resp. rate 20, height 5' 9 (1.753 m), weight 96.9 kg, SpO2 95%.  Physical Exam Constitutional:      General: He is not in acute distress.    Appearance: He is well-developed.  Cardiovascular:     Rate and Rhythm: Normal rate and regular rhythm.     Heart sounds: Normal heart sounds.  Pulmonary:     Effort: Pulmonary effort is normal.     Breath sounds: Normal breath sounds.  Skin:    General: Skin is warm and dry.  Neurological:     Mental Status: He is alert and oriented to person, place, and time.  Psychiatric:        Behavior: Behavior normal.        Thought Content: Thought content normal.        Judgment: Judgment normal.     Lab Results Lab Results  Component Value Date   WBC 9.7 07/23/2024   HGB 13.9 07/23/2024   HCT 40.3 07/23/2024   MCV 92.9 07/23/2024   PLT 81 (L) 07/23/2024    Lab Results  Component Value Date   CREATININE 1.59 (H) 07/24/2024   BUN 29 (H) 07/24/2024   NA 135 07/24/2024   K 2.8 (L)  07/24/2024   CL 95 (L) 07/24/2024   CO2 27 07/24/2024    Lab Results  Component Value Date   ALT <5 07/23/2024   AST 32 07/23/2024   ALKPHOS 57 07/23/2024   BILITOT 1.3 (H) 07/23/2024     Microbiology: Recent Results (from the past 240 hours)  Culture, blood (single)     Status: None   Collection Time: 07/14/24  7:30 PM   Specimen: BLOOD  Result Value Ref Range Status   Specimen Description BLOOD SITE NOT SPECIFIED  Final   Special Requests   Final    BOTTLES DRAWN AEROBIC AND ANAEROBIC Blood Culture adequate volume   Culture   Final    NO GROWTH 5 DAYS Performed at [facility], [address], [place], [place] [address]    Report Status 07/19/2024 FINAL  Final  Resp panel by RT-PCR (RSV, Flu A&B, Covid) Anterior Nasal Swab     Status: None   Collection Time: 07/21/24 11:06 AM   Specimen: Anterior Nasal Swab  Result Value Ref Range Status   SARS Coronavirus 2 by RT PCR NEGATIVE NEGATIVE Final    Comment: (NOTE) SARS-CoV-2 target nucleic acids are NOT DETECTED.  The SARS-CoV-2 RNA is generally detectable in upper respiratory specimens during the acute phase of infection. The lowest concentration of SARS-CoV-2 viral copies this assay can detect is 138 copies/mL. A negative result does not preclude SARS-Cov-2 infection and should not be used as the sole basis for treatment or other patient management decisions. A negative result may occur with  improper specimen collection/handling, submission of specimen other than nasopharyngeal swab, presence of viral mutation(s) within the areas targeted by this assay, and inadequate number of viral copies(<138 copies/mL). A negative result must be combined with clinical observations, patient history, and epidemiological information. The expected result is  Negative.  Fact Sheet for Patients:  BloggerCourse.com  Fact Sheet for Healthcare Providers:  SeriousBroker.it  This  test is no t yet approved or cleared by the [place]  FDA and  has been authorized for detection and/or diagnosis of SARS-CoV-2 by FDA under an Emergency Use Authorization (EUA). This EUA will remain  in effect (meaning this test can be used) for the duration of the COVID-19 declaration under Section 564(b)(1) of the Act, 21 U.S.C.section 360bbb-3(b)(1), unless the authorization is terminated  or revoked sooner.       Influenza A by PCR NEGATIVE NEGATIVE Final   Influenza B by PCR NEGATIVE NEGATIVE Final    Comment: (NOTE) The Xpert Xpress SARS-CoV-2/FLU/RSV plus assay is intended as an aid in the diagnosis of influenza from Nasopharyngeal swab specimens and should not be used as a sole basis for treatment. Nasal washings and aspirates are unacceptable for Xpert Xpress SARS-CoV-2/FLU/RSV testing.  Fact Sheet for Patients: BloggerCourse.com  Fact Sheet for Healthcare Providers: SeriousBroker.it  This test is not yet approved or cleared by the [place]  FDA and has been authorized for detection and/or diagnosis of SARS-CoV-2 by FDA under an Emergency Use Authorization (EUA). This EUA will remain in effect (meaning this test can be used) for the duration of the COVID-19 declaration under Section 564(b)(1) of the Act, 21 U.S.C. section 360bbb-3(b)(1), unless the authorization is terminated or revoked.     Resp Syncytial Virus by PCR NEGATIVE NEGATIVE Final    Comment: (NOTE) Fact Sheet for Patients: BloggerCourse.com  Fact Sheet for Healthcare Providers: SeriousBroker.it  This test is not yet approved or cleared by the [place]  FDA and has been authorized for detection and/or diagnosis of SARS-CoV-2 by FDA under an Emergency Use Authorization (EUA). This EUA will remain in effect (meaning this test can be used) for the duration of the COVID-19 declaration under  Section 564(b)(1) of the Act, 21 U.S.C. section 360bbb-3(b)(1), unless the authorization is terminated or revoked.  Performed at [facility], [address] [place], [place] [address]      Cathlyn July, NP [facility]  07/24/2024  2:25 PM

## 2024-07-24 NOTE — H&P (View-Only) (Signed)
 Progress Note  Patient Name: Philip [name] Date of Encounter: 07/24/2024 [facility] Cardiologist: Dorn Lesches, MD   Interval Summary    Looks much better today and feels better overall. Breathing with significant improvement. Converted to SR overnight and maintaining.   Vital Signs Vitals:   07/24/24 0455 07/24/24 0500 07/24/24 0754 07/24/24 0800  BP: (!) 144/83  (!) 149/87 (!) 149/87  Pulse: 87  90 91  Resp: 16  18 20   Temp: 97.8 F (36.6 C)  98 F (36.7 C) 98.2 F (36.8 C)  TempSrc: Oral  Oral Oral  SpO2: 93%  92% 95%  Weight:  96.9 kg    Height:        Intake/Output Summary (Last 24 hours) at 07/24/2024 0952 Last data filed at 07/24/2024 0800 Gross per 24 hour  Intake 1249.7 ml  Output 2650 ml  Net -1400.3 ml      07/24/2024    5:00 AM 07/23/2024    3:30 AM 07/22/2024    4:15 AM  Last 3 Weights  Weight (lbs) 213 lb 10 oz 217 lb 8 oz 224 lb 13.9 oz  Weight (kg) 96.9 kg 98.657 kg 102 kg      Telemetry/ECG  Converted to sinus rhythm around 2000 last evening and maintaining this morning - Personally Reviewed  Physical Exam  GEN: No acute distress, down to 2L [place]   Neck: No JVD Cardiac: RRR, 2/6 systolic murmur, no rubs, or gallops.  Respiratory: Clear to auscultation bilaterally. 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    Acute decompensated HFpEF -- Presented with acute volume overload, bibasilar edema noted on chest x-ray with elevated proBNP -- Echocardiogram 06/18/2024 LVEF of 50%, RV not well-visualized, moderate left atrial enlargement, mild mitral stenosis. 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  MR mild to moderate with mild mitral stenosis -- limited echo 9/22 2.5 x 1.5 cm mobile vegetation on atrial surface of posterior leaflet of mitral valve  -- will plan for TEE tomorrow to better assessment MR   HTN -- metoprolol  37.5mg  BID, spiro 25mg  daily    Hyperlipidemia -- On atorvastatin  40 mg daily    CAD status post CABG '99 -- Most recent cath 2022 with patent LIMA to LAD, SVG to OM, SVG to PDA -- No aspirin  with need for DOAC   CKD III -- Cr 1.44>>1.33>>1.45>>1.59   Hypokalemia -- K+ 2.8, suppl   Elevated TSH --  TSH 7.337, T4 0.92 -- per primary  For questions or updates, please contact [facility] Please consult [email or web address] for contact info under   Signed, Manuelita Rummer, NP

## 2024-07-25 ENCOUNTER — Encounter (HOSPITAL_COMMUNITY)
Admission: EM | Disposition: A | Discharge status: Hospice | Payer: Self-pay | Source: Ambulatory Visit | Attending: Internal Medicine

## 2024-07-25 ENCOUNTER — Inpatient Hospital Stay (HOSPITAL_COMMUNITY): Admitting: Anesthesiology

## 2024-07-25 ENCOUNTER — Inpatient Hospital Stay (HOSPITAL_COMMUNITY)

## 2024-07-25 ENCOUNTER — Encounter (HOSPITAL_COMMUNITY): Payer: Self-pay | Admitting: Internal Medicine

## 2024-07-25 DIAGNOSIS — N1831 Chronic kidney disease, stage 3a: Secondary | ICD-10-CM

## 2024-07-25 DIAGNOSIS — E785 Hyperlipidemia, unspecified: Secondary | ICD-10-CM | POA: Diagnosis not present

## 2024-07-25 DIAGNOSIS — I340 Nonrheumatic mitral (valve) insufficiency: Secondary | ICD-10-CM

## 2024-07-25 DIAGNOSIS — I480 Paroxysmal atrial fibrillation: Secondary | ICD-10-CM

## 2024-07-25 DIAGNOSIS — I5033 Acute on chronic diastolic (congestive) heart failure: Secondary | ICD-10-CM | POA: Diagnosis not present

## 2024-07-25 DIAGNOSIS — I361 Nonrheumatic tricuspid (valve) insufficiency: Secondary | ICD-10-CM

## 2024-07-25 DIAGNOSIS — Z951 Presence of aortocoronary bypass graft: Secondary | ICD-10-CM

## 2024-07-25 DIAGNOSIS — I052 Rheumatic mitral stenosis with insufficiency: Secondary | ICD-10-CM | POA: Diagnosis not present

## 2024-07-25 DIAGNOSIS — I130 Hypertensive heart and chronic kidney disease with heart failure and stage 1 through stage 4 chronic kidney disease, or unspecified chronic kidney disease: Secondary | ICD-10-CM

## 2024-07-25 DIAGNOSIS — I2510 Atherosclerotic heart disease of native coronary artery without angina pectoris: Secondary | ICD-10-CM | POA: Diagnosis not present

## 2024-07-25 DIAGNOSIS — I330 Acute and subacute infective endocarditis: Secondary | ICD-10-CM | POA: Diagnosis not present

## 2024-07-25 DIAGNOSIS — Z95820 Peripheral vascular angioplasty status with implants and grafts: Secondary | ICD-10-CM

## 2024-07-25 HISTORY — PX: TRANSESOPHAGEAL ECHOCARDIOGRAM (CATH LAB): EP1270

## 2024-07-25 LAB — BASIC METABOLIC PANEL WITH GFR
Anion gap: 14 (ref 5–15)
BUN: 29 mg/dL — ABNORMAL HIGH (ref 8–23)
CO2: 25 mmol/L (ref 22–32)
Calcium: 8.8 mg/dL — ABNORMAL LOW (ref 8.9–10.3)
Chloride: 95 mmol/L — ABNORMAL LOW (ref 98–111)
Creatinine, Ser: 1.45 mg/dL — ABNORMAL HIGH (ref 0.61–1.24)
GFR, Estimated: 48 mL/min — ABNORMAL LOW (ref >60)
Glucose, Bld: 119 mg/dL — ABNORMAL HIGH (ref 70–99)
Potassium: 3.7 mmol/L (ref 3.5–5.1)
Sodium: 134 mmol/L — ABNORMAL LOW (ref 135–145)

## 2024-07-25 LAB — CBC
HCT: 36.3 % — ABNORMAL LOW (ref 39.0–52.0)
Hemoglobin: 12.5 g/dL — ABNORMAL LOW (ref 13.0–17.0)
MCH: 32.1 pg (ref 26.0–34.0)
MCHC: 34.4 g/dL (ref 30.0–36.0)
MCV: 93.1 fL (ref 80.0–100.0)
Platelets: 81 K/uL — ABNORMAL LOW (ref 150–400)
RBC: 3.9 MIL/uL — ABNORMAL LOW (ref 4.22–5.81)
RDW: 13.2 % (ref 11.5–15.5)
WBC: 7.7 K/uL (ref 4.0–10.5)
nRBC: 0 % (ref 0.0–0.2)

## 2024-07-25 LAB — MAGNESIUM: Magnesium: 2.2 mg/dL (ref 1.7–2.4)

## 2024-07-25 LAB — ECHO TEE

## 2024-07-25 SURGERY — TRANSESOPHAGEAL ECHOCARDIOGRAM (TEE) (CATHLAB)
Anesthesia: Monitor Anesthesia Care

## 2024-07-25 MED ORDER — SODIUM CHLORIDE 0.9 % IV SOLN: INTRAVENOUS | Status: DC | PRN

## 2024-07-25 MED ORDER — POTASSIUM CHLORIDE CRYS ER 20 MEQ PO TBCR
40.0000 meq | EXTENDED_RELEASE_TABLET | Freq: Once | ORAL | Status: AC
  Administered 2024-07-25: 40 meq via ORAL
  Filled 2024-07-25: qty 2

## 2024-07-25 MED ORDER — PROPOFOL 10 MG/ML IV BOLUS
INTRAVENOUS | Status: DC | PRN
  Administered 2024-07-25: 20 mg via INTRAVENOUS
  Administered 2024-07-25: 30 mg via INTRAVENOUS

## 2024-07-25 MED ORDER — AMIODARONE HCL 200 MG PO TABS
200.0000 mg | ORAL_TABLET | Freq: Two times a day (BID) | ORAL | Status: DC
  Administered 2024-07-25 – 2024-07-26 (×3): 200 mg via ORAL
  Filled 2024-07-25 (×3): qty 1

## 2024-07-25 MED ORDER — FUROSEMIDE 10 MG/ML IJ SOLN: 40.0000 mg | Freq: Once | INTRAMUSCULAR | Status: DC

## 2024-07-25 MED ORDER — FUROSEMIDE 10 MG/ML IJ SOLN
40.0000 mg | Freq: Four times a day (QID) | INTRAMUSCULAR | Status: AC
  Administered 2024-07-25 (×2): 40 mg via INTRAVENOUS
  Filled 2024-07-25: qty 4

## 2024-07-25 MED ORDER — PROPOFOL 500 MG/50ML IV EMUL
INTRAVENOUS | Status: DC | PRN
  Administered 2024-07-25: 125 ug/kg/min via INTRAVENOUS

## 2024-07-25 MED ORDER — FUROSEMIDE 10 MG/ML IJ SOLN
40.0000 mg | Freq: Once | INTRAMUSCULAR | Status: AC
  Administered 2024-07-25: 40 mg via INTRAVENOUS
  Filled 2024-07-25: qty 4

## 2024-07-25 MED ORDER — POTASSIUM CHLORIDE CRYS ER 20 MEQ PO TBCR
40.0000 meq | EXTENDED_RELEASE_TABLET | Freq: Once | ORAL | Status: AC
  Administered 2024-07-25: 40 meq via ORAL
  Filled 2024-07-25: qty 4

## 2024-07-25 MED ORDER — ZOLPIDEM TARTRATE 5 MG PO TABS
2.5000 mg | ORAL_TABLET | Freq: Once | ORAL | Status: AC
  Administered 2024-07-25: 2.5 mg via ORAL
  Filled 2024-07-25: qty 1

## 2024-07-25 NOTE — Progress Notes (Addendum)
 Rounding Note   Patient Name: Philip [name] Date of Encounter: 07/25/2024  [facility] Cardiologist: Philip Lesches, MD   Subjective He had worsening shortness of breath last night that made sleeping difficult. No chest pain or palpitations. He feels like his abdomen is distended. He reports some irregular bowel movements but does state he had a soft bowel movement yesterday.   TEE this morning showed severe MR with probable perforated leaflet.  Scheduled Meds:  amiodarone   200 mg Oral BID   apixaban   5 mg Oral BID   atorvastatin   40 mg Oral Daily   Chlorhexidine  Gluconate Cloth  6 each Topical Daily   empagliflozin   10 mg Oral Daily   furosemide   40 mg Intravenous Q6H   guaiFENesin   600 mg Oral BID   Influenza vac split trivalent PF  0.5 mL Intramuscular Tomorrow-1000   loratadine   10 mg Oral Daily   magnesium  oxide  800 mg Oral Daily   metoprolol  tartrate  37.5 mg Oral BID   potassium chloride   40 mEq Oral Once   potassium chloride   40 mEq Oral Once   sodium chloride  flush  10-40 mL Intracatheter Q12H   sodium chloride  flush  3 mL Intravenous Q12H   spironolactone   25 mg Oral Daily   zolpidem   2.5 mg Oral Once   Continuous Infusions:   ceFAZolin  (ANCEF ) IV 2 g (07/25/24 0641)   PRN Meds: acetaminophen  **OR** acetaminophen , bisacodyl , hydrALAZINE , melatonin, polyethylene glycol, sodium chloride  flush   Vital Signs  Vitals:   07/25/24 0859 07/25/24 0909 07/25/24 0935 07/25/24 1038  BP: 113/72 121/79 (!) 144/87 (!) 125/94  Pulse: 73 75 (!) 101 96  Resp: 18 19 18 20   Temp:   97.6 F (36.4 C) 98.1 F (36.7 C)  TempSrc:   Oral Oral  SpO2: 96% 95% 95% 97%  Weight:      Height:        Intake/Output Summary (Last 24 hours) at 07/25/2024 1044 Last data filed at 07/25/2024 0850 Gross per 24 hour  Intake 562.05 ml  Output 1650 ml  Net -1087.95 ml      07/25/2024    4:04 AM 07/24/2024    5:00 AM 07/23/2024    3:30 AM  Last 3 Weights  Weight (lbs) 215 lb  4.8 oz 213 lb 10 oz 217 lb 8 oz  Weight (kg) 97.659 kg 96.9 kg 98.657 kg      Telemetry Normal sinus rhythm with rates ranging from 70s to low 100s. - Personally Reviewed  ECG  No new ECG tracing today. - Personally Reviewed  Physical Exam  GEN: No acute distress.   Neck: No JVD noted with patient sitting upright. Cardiac: RRR. III/VI systolic murmur. Respiratory: No increased work of breathing. Decreased breath sounds in bilateral bases but no significant wheezes, rhonchi, or rales appreciated.  GI: Distended but non-tender to the touch. MS: No lower extremity edema. No deformity. Neuro:  No focal deficits.  Psych: Normal affect. Responds appropriately.   Labs High Sensitivity Troponin:  No results for input(s): TROPONINIHS in the last 720 hours.   Chemistry Recent Labs  Lab 07/22/24 0309 07/23/24 0224 07/24/24 0524 07/24/24 1807 07/25/24 0152 07/25/24 0545  NA 139 138 135 135 134*  --   K 4.0 3.0* 2.8* 3.3* 3.7  --   CL 103 98 95* 95* 95*  --   CO2 23 25 27 26 25   --   GLUCOSE 119* 141* 111* 161* 119*  --  BUN 20 21 29* 30* 29*  --   CREATININE 1.33* 1.45* 1.59* 1.54* 1.45*  --   CALCIUM  9.0 8.9 8.7* 8.7* 8.8*  --   MG 2.1 1.9 2.1  --   --  2.2  PROT 6.3* 6.6  --   --   --   --   ALBUMIN 3.7 3.6  --   --   --   --   AST 26 32  --   --   --   --   ALT 6 <5  --   --   --   --   ALKPHOS 63 57  --   --   --   --   BILITOT 1.3* 1.3*  --   --   --   --   GFRNONAA 53* 48* 43* 44* 48*  --   ANIONGAP 13 15 13 14 14   --     Lipids No results for input(s): CHOL, TRIG, HDL, LABVLDL, LDLCALC, CHOLHDL in the last 168 hours.  Hematology Recent Labs  Lab 07/22/24 0309 07/23/24 0224 07/25/24 0152  WBC 9.0 9.7 7.7  RBC 4.26 4.34 3.90*  HGB 13.5 13.9 12.5*  HCT 40.5 40.3 36.3*  MCV 95.1 92.9 93.1  MCH 31.7 32.0 32.1  MCHC 33.3 34.5 34.4  RDW 13.8 13.7 13.2  PLT 79* 81* 81*   Thyroid   Recent Labs  Lab 07/22/24 0309 07/22/24 0830  TSH 7.337*  --    FREET4  --  0.92    BNP Recent Labs  Lab 07/21/24 1106  PROBNP 2,281.0*    DDimer No results for input(s): DDIMER in the last 168 hours.   Radiology  ECHO TEE Result Date: 07/25/2024    TRANSESOPHOGEAL ECHO REPORT   Patient Name:   Philip [name] Date of Exam: 07/25/2024 Medical Rec #:  [id number]      Height:       69.0 in Accession #:    [id number]     Weight:       215.3 lb Date of Birth:  [birth year]      BSA:          2.132 m Patient Age:    83 years       BP:           136/89 mmHg Patient Gender: M              HR:           103 bpm. Exam Location:  Inpatient Procedure: Transesophageal Echo, Cardiac Doppler and Color Doppler (Both            Spectral and Color Flow Doppler were utilized during procedure). Indications:     Endocarditis  History:         Patient has prior history of Echocardiogram examinations, most                  recent 07/22/2024. CHF, CAD and Previous Myocardial Infarction;                  Risk Factors:Hypertension and Sleep Apnea.  Sonographer:     Philip [name] Referring Phys:  [phone] Philip [name] Diagnosing Phys: Philip Shallow MD PROCEDURE: After discussion of the risks and benefits of a TEE, an informed consent was obtained from the patient. The transesophogeal probe was passed without difficulty through the esophogus of the patient. Sedation performed by different physician. The patient was monitored while under deep sedation. Anesthestetic sedation was  provided intravenously by Anesthesiology: 200mg  of Propofol . Image quality was excellent. The patient developed no complications during the procedure.  IMPRESSIONS  1. Large vegetations noted on MV with probable leaflet perforation; severe eccentric MR.  2. Left ventricular ejection fraction, by estimation, is 55 to 60%. The left ventricle has normal function. The left ventricle has no regional wall motion abnormalities.  3. Right ventricular systolic function is normal. The right ventricular size is normal.  4. Left  atrial size was severely dilated. No left atrial/left atrial appendage thrombus was detected.  5. The mitral valve is abnormal. Severe mitral valve regurgitation.  6. The aortic valve is tricuspid. Aortic valve regurgitation is not visualized. Aortic valve sclerosis/calcification is present, without any evidence of aortic stenosis.  7. There is Moderate (Grade III) plaque involving the descending aorta.  8. 3D performed of the mitral valve. Conclusion(s)/Recommendation(s): Findings are concerning for vegetation/infective endocarditis as detailed above. Findings concerning for mitral valve vegetation. FINDINGS  Left Ventricle: Left ventricular ejection fraction, by estimation, is 55 to 60%. The left ventricle has normal function. The left ventricle has no regional wall motion abnormalities. The left ventricular internal cavity size was normal in size. There is  no left ventricular hypertrophy. Right Ventricle: The right ventricular size is normal. Right ventricular systolic function is normal. Left Atrium: Left atrial size was severely dilated. No left atrial/left atrial appendage thrombus was detected. Right Atrium: Right atrial size was normal in size. Pericardium: There is no evidence of pericardial effusion. Mitral Valve: The mitral valve is abnormal. Severe mitral valve regurgitation. Tricuspid Valve: The tricuspid valve is normal in structure. Tricuspid valve regurgitation is mild. Aortic Valve: The aortic valve is tricuspid. Aortic valve regurgitation is not visualized. Aortic valve sclerosis/calcification is present, without any evidence of aortic stenosis. Pulmonic Valve: The pulmonic valve was normal in structure. Pulmonic valve regurgitation is trivial. Aorta: The aortic root is normal in size and structure. There is moderate (Grade III) plaque involving the descending aorta. IAS/Shunts: No atrial level shunt detected by color flow Doppler. Additional Comments: Large vegetations noted on MV with probable  leaflet perforation; severe eccentric MR. 3D was performed not requiring image post processing on an independent workstation and was abnormal. Philip Shallow MD Electronically signed by Philip Shallow MD Signature Date/Time: 07/25/2024/10:20:46 AM    Final    EP STUDY Result Date: 07/25/2024 See surgical note for result.   Cardiac Studies  Limited TTE 07/22/2024:  1. Left ventricular ejection fraction, by estimation, is 60 to 65%. The  left ventricle has normal function. Left ventricular endocardial border  not optimally defined to evaluate regional wall motion.   2. Left atrial size was mildly dilated.   3. 2.5 x 1.5 cm mobile vegetation attached to the atrial surface of the  posterior leaflet of the mitral valve near the base of the leaflet. It  prolapses across the leaflet. Mitral regurgitation is present but is not  fully interrogated. Moderate mitral  annular calcification.   4. There is mild calcification of the aortic valve.   5. The inferior vena cava is normal in size with greater than 50%  respiratory variability, suggesting right atrial pressure of 3 mmHg.   6. Very limited echo. There is still a large mitral valve vegetation. The  mitral regurgitation was not fully interrogated. The RV was not  visualized. The aortic valve was not visualized. Would consider full echo  or repeat TEE.  _______________  TEE 07/25/2024: Impressions: 1. Large vegetations noted on MV with  probable leaflet perforation;  severe eccentric MR.   2. Left ventricular ejection fraction, by estimation, is 55 to 60%. The  left ventricle has normal function. The left ventricle has no regional  wall motion abnormalities.   3. Right ventricular systolic function is normal. The right ventricular  size is normal.   4. Left atrial size was severely dilated. No left atrial/left atrial  appendage thrombus was detected.   5. The mitral valve is abnormal. Severe mitral valve regurgitation.   6. The aortic  valve is tricuspid. Aortic valve regurgitation is not  visualized. Aortic valve sclerosis/calcification is present, without any  evidence of aortic stenosis.   7. There is Moderate (Grade III) plaque involving the descending aorta.   8. 3D performed of the mitral valve.   Conclusion(s)/Recommendation(s): Findings are concerning for  vegetation/infective endocarditis as detailed above. Findings concerning  for mitral valve vegetation.   Patient Profile   83 y.o. 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   Assessment & Plan   Acute Diastolic CHF Patient presented with worsening with shortness of breath and volume overload. Pro BNP 2,281. Chest x-ray suggestive of edema. TTE showed LVEF of 60-65%. Patient was initially diuresed with IV Lasix  but this was held yesterday. Documented urinary output of 2.05 L yesterday and net negative 4.9 L this admission. Weight down 9 lbs from admission but up 2 lbs from yesterday.  - He reports more shortness of breath overnight that made it difficult to sleep. Decreased breath sounds on exam.  - Will resume IV Lasix . Will give 2 doses of 40mg  daily and reassess tomorrow.   - Continue Spironolactone  25mg  daily.  - Continue Jardiance  10mg  daily.  - Continue to monitor daily weights, strict I/Os, and renal function.  Atrial Fibrillation/ Flutter with RVR Patient has a history of paroxysmal atrial fibrillation and was noted to be in atrial fibrillation/ flutter with RVR on admission. He was started on IV Amiodarone  and converted to sinus rhythm on  9/24. - Maintaining sinus rhythm. - Will stop IV Amiodarone  and transition to PO Amiodarone  200mg  twice daily. - Continue Lopressor   37.5mg  twice daily. - Continue Eliquis  5mg  twice daily.  Mitral Valve Endocarditis Severe Mitral Regurgitation Admitted with MSSA bacteremia secondary to mitral valve endocarditis in 05/2024. TEE during that admission shwoed a large mobile vegetation of mitral valve with mild to moderate MR. He was treated with IV antibiotics. Repeat TEE this admission showed large vegetation with probable leaflet perforation and severe eccentric MR.  - Continue antibiotics per ID. - Continue diuresis as above. - Will consult CT Surgery. If he is a candidate for surgery, can arrange R/LHC.   CAD s/p CABG S/p remote CABG in 1999. Last LHC in 2022 showed patent grafts. - No chest pain. - No Aspirin  given need for anticoagulation. - Continue Eliquis  5mg  twice daily.  Hypertension BP mildly elevated but reasonably well controlled given age.  - Continue Lopressor  and Spironolactone  as above.  Hyperlipidemia - Continue Lipitor 40mg  daily.  CKD Stage III Creatinine 1.44 on admission. Peaked at 1.59 on 9/24. Baseline around 1.2 to 1.4.  - Stable at 1.45 today. -  Continue to monitor closely with diuresis.  Hypokalemia  Potassium as low as 2.8 on 9/24. Repleted. - Stable at 3.7 today.  - Will give another dose of KCl today since we are restarting Lasix . - Continue to monitor closely.  Otherwise, per primary team: - MSSA bacteremia - Elevated TSH - AAA - GERD - Sleep apnea      For questions or updates, please contact [facility] Please consult [email or web address] for contact info under       Signed, Margot Oriordan E Greggory Safranek, PA-C  07/25/2024, 10:44 AM

## 2024-07-25 NOTE — Anesthesia Preprocedure Evaluation (Signed)
 Anesthesia Evaluation  Patient identified by MRN, date of birth, ID band Patient awake    Reviewed: Allergy & Precautions, NPO status , Patient's Chart, lab work & pertinent test results  Airway Mallampati: II  TM Distance: >3 FB Neck ROM: Full    Dental no notable dental hx.    Pulmonary sleep apnea , former smoker   Pulmonary exam normal        Cardiovascular hypertension, Pt. on medications and Pt. on home beta blockers + CAD, + Past MI, + Peripheral Vascular Disease and +CHF  + dysrhythmias + Valvular Problems/Murmurs MR  Rhythm:Regular Rate:Normal  ECHO  1. Left ventricular ejection fraction, by estimation, is 60 to 65%. The left ventricle has normal function. Left ventricular endocardial border not optimally defined to evaluate regional wall motion.  2. Left atrial size was mildly dilated.  3. 2.5 x 1.5 cm mobile vegetation attached to the atrial surface of the posterior leaflet of the mitral valve near the base of the leaflet. It prolapses across the leaflet. Mitral regurgitation is present but is not fully interrogated. Moderate mitral annular calcification.  4. There is mild calcification of the aortic valve.  5. The inferior vena cava is normal in size with greater than 50% respiratory variability, suggesting right atrial pressure of 3 mmHg.  6. Very limited echo. There is still a large mitral valve vegetation. The mitral regurgitation was not fully interrogated. The RV was not visualized. The aortic valve was not visualized. Would consider full echo or repeat TEE.      Neuro/Psych    Depression    negative neurological ROS     GI/Hepatic Neg liver ROS,GERD  ,,  Endo/Other  Hypothyroidism    Renal/GU   negative genitourinary   Musculoskeletal  (+) Arthritis , Osteoarthritis,    Abdominal Normal abdominal exam  (+)   Peds  Hematology Lab Results      Component                Value               Date                       WBC                      7.7                 07/25/2024                HGB                      12.5 (L)            07/25/2024                HCT                      36.3 (L)            07/25/2024                MCV                      93.1                07/25/2024                PLT  81 (L)              07/25/2024             Lab Results      Component                Value               Date                      NA                       134 (L)             07/25/2024                K                        3.7                 07/25/2024                CO2                      25                  07/25/2024                GLUCOSE                  119 (H)             07/25/2024                BUN                      29 (H)              07/25/2024                CREATININE               1.45 (H)            07/25/2024                CALCIUM                   8.8 (L)             07/25/2024                GFR                      43.68 (L)           05/22/2024                GFRNONAA                 48 (L)              07/25/2024              Anesthesia Other Findings   Reproductive/Obstetrics                              Anesthesia Physical Anesthesia Plan  ASA: 3  Anesthesia Plan: MAC   Post-op Pain Management:    Induction: Intravenous  PONV Risk Score and Plan: 1 and Propofol  infusion and Treatment may vary due to age or medical condition  Airway Management Planned: Simple Face Mask and Nasal Cannula  Additional Equipment: None  Intra-op Plan:   Post-operative Plan:   Informed Consent: I have reviewed the patients History and Physical, chart, labs and discussed the procedure including the risks, benefits and alternatives for the proposed anesthesia with the patient or authorized representative who has indicated his/her understanding and acceptance.     Dental advisory given  Plan Discussed with:  CRNA  Anesthesia Plan Comments:         Anesthesia Quick Evaluation

## 2024-07-25 NOTE — Progress Notes (Signed)
 Physical Therapy Treatment Patient Details Name: Philip [name] MRN: [id number] DOB: 1941/03/10 Today's Date: 07/25/2024   History of Present Illness 83 yo male adm 07/21/24 with SOB. 9/22 Rapid Afib with RVR. 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 Afib, CAD s/p CABG, PAD, OSA, AAA stenting, penile prothesis, HTN, HLD, skin CA    PT Comments  Pt pleasant and states feeling better and eager to move more. Pt able to walk in hall with HR maintained <140 with desaturation to 88% on RA with activity. Pt hopeful for continued progression and controlled HR. Will continue to follow, encouraged mobility with staff.     If plan is discharge home, recommend the following: A little help with walking and/or transfers;A little help with bathing/dressing/bathroom;Assistance with cooking/housework;Assist for transportation   Can travel by private vehicle        Equipment Recommendations  None recommended by PT    Recommendations for Other Services       Precautions / Restrictions Precautions Precautions: Fall;Other (comment) Recall of Precautions/Restrictions: Intact Precaution/Restrictions Comments: watch HR     Mobility  Bed Mobility Overal bed mobility: Modified Independent                  Transfers Overall transfer level: Needs assistance   Transfers: Sit to/from Stand Sit to Stand: Supervision           General transfer comment: supervision for lines to rise from bed and toilet. pt without physical assist. 5 repeated sit to stands from chair without UB reliance in <30 sec    Ambulation/Gait Ambulation/Gait assistance: Supervision Gait Distance (Feet): 300 Feet Assistive device: None Gait Pattern/deviations: WFL(Within Functional Limits)   Gait velocity interpretation: 1.31 - 2.62 ft/sec, indicative of limited community ambulator   General Gait Details: pt with steady gait with HR max 138 during gait with  desaturation to 88% which did not improve with standing rest and pursed lip breathing, returned to 2L at 94% in room   Stairs             Wheelchair Mobility     Tilt Bed    Modified Rankin (Stroke Patients Only)       Balance Overall balance assessment: No apparent balance deficits (not formally assessed)                                          Communication Communication Communication: No apparent difficulties  Cognition Arousal: Alert Behavior During Therapy: WFL for tasks assessed/performed   PT - Cognitive impairments: No apparent impairments                         Following commands: Intact      Cueing Cueing Techniques: Verbal cues  Exercises      General Comments        Pertinent Vitals/Pain Pain Assessment Pain Assessment: No/denies pain    Home Living                          Prior Function            PT Goals (current goals can now be found in the care plan section) Progress towards PT goals: Progressing toward goals    Frequency    Min 2X/week  PT Plan      Co-evaluation              AM-PAC PT 6 Clicks Mobility   Outcome Measure  Help needed turning from your back to your side while in a flat bed without using bedrails?: None Help needed moving from lying on your back to sitting on the side of a flat bed without using bedrails?: None Help needed moving to and from a bed to a chair (including a wheelchair)?: None Help needed standing up from a chair using your arms (e.g., wheelchair or bedside chair)?: None Help needed to walk in hospital room?: A Little Help needed climbing 3-5 steps with a railing? : A Little 6 Click Score: 22    End of Session   Activity Tolerance: Patient tolerated treatment well Patient left: in chair;with call bell/phone within reach;with family/visitor present Nurse Communication: Mobility status PT Visit Diagnosis: Other abnormalities of gait  and mobility (R26.89);Difficulty in walking, not elsewhere classified (R26.2)     Time: 8887-8866 PT Time Calculation (min) (ACUTE ONLY): 21 min  Charges:    $Gait Training: 8-22 mins PT General Charges $$ ACUTE PT VISIT: 1 Visit                     Lenoard SQUIBB, PT Acute Rehabilitation Services Office: [phone]    Gregroy Dombkowski B Reham Slabaugh 07/25/2024, 1:10 PM

## 2024-07-25 NOTE — Progress Notes (Signed)
     Transesophageal Echocardiogram Note  Philip [name] [id number] 01-15-1941  Procedure: Transesophageal Echocardiogram Indications: SBE  Procedure Details Consent: Obtained Time Out: Verified patient identification, verified procedure, site/side was marked, verified correct patient position, special equipment/implants available, Radiology Safety Procedures followed,  medications/allergies/relevent history reviewed, required imaging and test results available.  Performed  Medications:  Pt sedated by anesthesia with diprovan 200 mg IV total.  Normal LV function; severe LAE; no LAA thrombus; large oscillating densities on MV with leaflet perforation; severe eccentric MR; mild TR. Full report to follow.    Complications: No apparent complications Patient did tolerate procedure well.  [name], MD

## 2024-07-25 NOTE — Anesthesia Postprocedure Evaluation (Signed)
 Anesthesia Post Note  Patient: Philip [name]  Procedure(s) Performed: TRANSESOPHAGEAL ECHOCARDIOGRAM     Patient location during evaluation: PACU Anesthesia Type: MAC Level of consciousness: awake and alert Pain management: pain level controlled Vital Signs Assessment: post-procedure vital signs reviewed and stable Respiratory status: spontaneous breathing, nonlabored ventilation, respiratory function stable and patient connected to nasal cannula oxygen Cardiovascular status: stable and blood pressure returned to baseline Postop Assessment: no apparent nausea or vomiting Anesthetic complications: no   There were no known notable events for this encounter.  Last Vitals:  Vitals:   07/25/24 0909 07/25/24 0935  BP: 121/79 (!) 144/87  Pulse: 75 (!) 101  Resp: 19 18  Temp:  36.4 C  SpO2: 95% 95%    Last Pain:  Vitals:   07/25/24 0935  TempSrc: Oral  PainSc:                  Philip [name]

## 2024-07-25 NOTE — Progress Notes (Signed)
      [address] [place] [employer] [address]             [phone]      Philip [name] is an 83 yo male with known history of CAD, S/P CABG performed in 1999 in [place], Paroxysmal Atrial Fibrillation, HLD, TAA S/P Stenting by Dr. Eliza, H/O Melanoma and most recently squamous cell carcinoma. [facility] was [facility] and we were consulted for cardiothoracic surgery evaluation on 06/24/24 due to mitral valve endocarditis with a 1.8x1.2cm mass on the mitral valve with mild mitral stenosis and mild to moderate mitral regurgitation. At that time Dr. Kerrin reviewed the patient's diagnostic studies and determined he was a poor candidate for surgery and the benefits of surgery would not outweigh the risks.   We have been asked to re-evaluate the patient after TEE on 09/25 showed probable leaflet perforation with severe eccentric mitral regurgitation. Dr. Shyrl reviewed the patient's updated diagnostic studies and determined the patient would likely not survive surgery and benefits would still not outweigh the risks. Recommend continued IV antibiotics and supportive measures. May need to consider palliative consult.   Con GORMAN Bend, PA-C 07/25/24

## 2024-07-25 NOTE — Progress Notes (Signed)
   Notified that patient only had about 500cc of urinary output after dose of IV Lasix  this morning. Therefore, will increase evening dose of IV Lasix  to 80mg  (he just received the dose 40mg  at about 5pm so will give another 40mg  now). Will also give another dose of 40 mEq of KCl tonight.  Asriel Westrup E Chaun Uemura, PA-C 07/25/2024 6:02 PM

## 2024-07-25 NOTE — Progress Notes (Signed)
 PROGRESS NOTE    Philip [name]  FMW:[id number] DOB: 1941/06/13 DOA: 07/21/2024 PCP: [name], MD   83/M w MV endocarditis, hypertension, CKD, abdominal aortic aneurysm, who presented with dyspnea and orthopnea on day of admission. -labs, cr 1.44 , BNP 2,281  CXR w bilateral hilar vascular congestion, bilateral central interstitial infiltrates, predominant at the lower lobes.  - Admitted, started on diuretics, IV amiodarone  - Repeat echo with concern for larger vegetation on mitral valve - Repeat TEE 9/25 with large vegetation and perforation, severe MR   Subjective: Feels better, just back from TEE  Assessment and Plan:  Acute on chronic diastolic CHF -Echo w EF 60 to 34%, 2.5 x 1.5 cm mobile vegetation attached to the atrial surface of the posterior leaflet of the mitral valve near the base of the leaflet. Positive mitral valve regurgitation.  - Improving with diuresis, continue IV Lasix  for 1 more day - continue Jardiance  and Aldactone  - Increase activity, wean O2  Acute hypoxemic respiratory failure due to acute cardiogenic pulmonary edema.  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  TEE 8/25 noted 1.8 x 1.2 cm vegetation on mitral valve, perforation of the mitral valve, treated with IV cefazolin  to complete therapy on 10/1 -Repeat TEE now with larger vegetation, perforation, severe MR -Appreciate reeval by ID -Seen by Dr. Kerrin in August, felt to be a poor surgical candidate then, T CTS reconsulted today -Will need palliative care involvement if decision is the same  Paroxysmal atrial fibrillation (HCC) Atrial flutter with variable block.  -Now in sinus rhythm, continue metoprolol , Eliquis , po amiodarone , cards following  Essential hypertension -Stable, meds as above  CAD, CABG No chest pain, elevation high sensitive troponin due to tachycardia and heart  failure -Stable, continue statin, DOAC  Chronic kidney disease, stage 3a (HCC) Hypokalemia  -Mild uptrend but relatively stable  AAA (abdominal aortic aneurysm) Follow up as outpatient   Obesity (BMI 30.0-34.9) Calculated BMI is 33.2 consistent in obesity class 1   DVT prophylaxis: apixaban  Code Status: Cussed CODE STATUS with the patient today, he agrees to DNR Family Communication: None present Disposition Plan:   Consultants:    Procedures:   Antimicrobials:    Objective: Vitals:   07/25/24 0859 07/25/24 0909 07/25/24 0935 07/25/24 1038  BP: 113/72 121/79 (!) 144/87 (!) 125/94  Pulse: 73 75 (!) 101 96  Resp: 18 19 18 20   Temp:   97.6 F (36.4 C) 98.1 F (36.7 C)  TempSrc:   Oral Oral  SpO2: 96% 95% 95% 97%  Weight:      Height:        Intake/Output Summary (Last 24 hours) at 07/25/2024 1133 Last data filed at 07/25/2024 0850 Gross per 24 hour  Intake 487 ml  Output 1650 ml  Net -1163 ml   Filed Weights   07/23/24 0330 07/24/24 0500 07/25/24 0404  Weight: 98.7 kg 96.9 kg 97.7 kg    Examination:  Gen: Awake, Alert, Oriented X 3,  HEENT: Positive JVD Lungs: Few basilar rales CVS: S1S2/RRR systolic murmur Abd: soft, Non tender, non distended, BS present Extremities: Trace edema Skin: no new rashes on exposed skin     Data Reviewed:   CBC: Recent Labs  Lab 07/21/24 1106 07/22/24 0309 07/23/24 0224 07/25/24 0152  WBC 7.5 9.0 9.7 7.7  NEUTROABS 5.3 6.2  --   --   HGB 12.0* 13.5 13.9 12.5*  HCT 34.9*  40.5 40.3 36.3*  MCV 94.8 95.1 92.9 93.1  PLT 76* 79* 81* 81*   Basic Metabolic Panel: Recent Labs  Lab 07/22/24 0309 07/23/24 0224 07/24/24 0524 07/24/24 1807 07/25/24 0152 07/25/24 0545  NA 139 138 135 135 134*  --   K 4.0 3.0* 2.8* 3.3* 3.7  --   CL 103 98 95* 95* 95*  --   CO2 23 25 27 26 25   --   GLUCOSE 119* 141* 111* 161* 119*  --   BUN 20 21 29* 30* 29*  --   CREATININE 1.33* 1.45* 1.59* 1.54* 1.45*  --   CALCIUM  9.0 8.9  8.7* 8.7* 8.8*  --   MG 2.1 1.9 2.1  --   --  2.2  PHOS 3.7  --   --   --   --   --    GFR: Estimated Creatinine Clearance: 43.7 mL/min (A) (by C-G formula based on SCr of 1.45 mg/dL (H)). Liver Function Tests: Recent Labs  Lab 07/22/24 0309 07/23/24 0224  AST 26 32  ALT 6 <5  ALKPHOS 63 57  BILITOT 1.3* 1.3*  PROT 6.3* 6.6  ALBUMIN 3.7 3.6   No results for input(s): LIPASE, AMYLASE in the last 168 hours. No results for input(s): AMMONIA in the last 168 hours. Coagulation Profile: No results for input(s): INR, PROTIME in the last 168 hours. Cardiac Enzymes: No results for input(s): CKTOTAL, CKMB, CKMBINDEX, TROPONINI in the last 168 hours. BNP (last 3 results) Recent Labs    07/21/24 1106  PROBNP 2,281.0*   HbA1C: No results for input(s): HGBA1C in the last 72 hours. CBG: No results for input(s): GLUCAP in the last 168 hours. Lipid Profile: No results for input(s): CHOL, HDL, LDLCALC, TRIG, CHOLHDL, LDLDIRECT in the last 72 hours. Thyroid  Function Tests: No results for input(s): TSH, T4TOTAL, FREET4, T3FREE, THYROIDAB in the last 72 hours.  Anemia Panel: No results for input(s): VITAMINB12, FOLATE, FERRITIN, TIBC, IRON, RETICCTPCT in the last 72 hours. Urine analysis:    Component Value Date/Time   COLORURINE YELLOW 10/06/2021 1523   APPEARANCEUR CLEAR 10/06/2021 1523   LABSPEC 1.010 10/06/2021 1523   PHURINE 7.0 10/06/2021 1523   GLUCOSEU NEGATIVE 10/06/2021 1523   GLUCOSEU NEGATIVE 04/24/2020 0847   HGBUR NEGATIVE 10/06/2021 1523   HGBUR negative 11/10/2008 1322   BILIRUBINUR NEGATIVE 10/06/2021 1523   KETONESUR NEGATIVE 10/06/2021 1523   PROTEINUR NEGATIVE 10/06/2021 1523   UROBILINOGEN 0.2 04/24/2020 0847   NITRITE NEGATIVE 10/06/2021 1523   LEUKOCYTESUR NEGATIVE 10/06/2021 1523   Sepsis Labs: @LABRCNTIP (procalcitonin:4,lacticidven:4)  ) Recent Results (from the past 240 hours)  Resp panel by  RT-PCR (RSV, Flu A&B, Covid) Anterior Nasal Swab     Status: None   Collection Time: 07/21/24 11:06 AM   Specimen: Anterior Nasal Swab  Result Value Ref Range Status   SARS Coronavirus 2 by RT PCR NEGATIVE NEGATIVE Final    Comment: (NOTE) SARS-CoV-2 target nucleic acids are NOT DETECTED.  The SARS-CoV-2 RNA is generally detectable in upper respiratory specimens during the acute phase of infection. The lowest concentration of SARS-CoV-2 viral copies this assay can detect is 138 copies/mL. A negative result does not preclude SARS-Cov-2 infection and should not be used as the sole basis for treatment or other patient management decisions. A negative result may occur with  improper specimen collection/handling, submission of specimen other than nasopharyngeal swab, presence of viral mutation(s) within the areas targeted by this assay, and inadequate number of viral copies(<138 copies/mL). A  negative result must be combined with clinical observations, patient history, and epidemiological information. The expected result is Negative.  Fact Sheet for Patients:  BloggerCourse.com  Fact Sheet for Healthcare Providers:  SeriousBroker.it  This test is no t yet approved or cleared by the [place]  FDA and  has been authorized for detection and/or diagnosis of SARS-CoV-2 by FDA under an Emergency Use Authorization (EUA). This EUA will remain  in effect (meaning this test can be used) for the duration of the COVID-19 declaration under Section 564(b)(1) of the Act, 21 U.S.C.section 360bbb-3(b)(1), unless the authorization is terminated  or revoked sooner.       Influenza A by PCR NEGATIVE NEGATIVE Final   Influenza B by PCR NEGATIVE NEGATIVE Final    Comment: (NOTE) The Xpert Xpress SARS-CoV-2/FLU/RSV plus assay is intended as an aid in the diagnosis of influenza from Nasopharyngeal swab specimens and should not be used as a sole basis  for treatment. Nasal washings and aspirates are unacceptable for Xpert Xpress SARS-CoV-2/FLU/RSV testing.  Fact Sheet for Patients: BloggerCourse.com  Fact Sheet for Healthcare Providers: SeriousBroker.it  This test is not yet approved or cleared by the [place]  FDA and has been authorized for detection and/or diagnosis of SARS-CoV-2 by FDA under an Emergency Use Authorization (EUA). This EUA will remain in effect (meaning this test can be used) for the duration of the COVID-19 declaration under Section 564(b)(1) of the Act, 21 U.S.C. section 360bbb-3(b)(1), unless the authorization is terminated or revoked.     Resp Syncytial Virus by PCR NEGATIVE NEGATIVE Final    Comment: (NOTE) Fact Sheet for Patients: BloggerCourse.com  Fact Sheet for Healthcare Providers: SeriousBroker.it  This test is not yet approved or cleared by the [place]  FDA and has been authorized for detection and/or diagnosis of SARS-CoV-2 by FDA under an Emergency Use Authorization (EUA). This EUA will remain in effect (meaning this test can be used) for the duration of the COVID-19 declaration under Section 564(b)(1) of the Act, 21 U.S.C. section 360bbb-3(b)(1), unless the authorization is terminated or revoked.  Performed at [facility], [address] [place], [place] [address]      Radiology Studies: ECHO TEE Result Date: 07/25/2024    TRANSESOPHOGEAL ECHO REPORT   Patient Name:   Philip [name] Few Date of Exam: 07/25/2024 Medical Rec #:  [id number]      Height:       69.0 in Accession #:    [id number]     Weight:       215.3 lb Date of Birth:  [birth year]      BSA:          2.132 m Patient Age:    83 years       BP:           136/89 mmHg Patient Gender: M              HR:           103 bpm. Exam Location:  Inpatient Procedure: Transesophageal Echo, Cardiac Doppler and Color Doppler (Both             Spectral and Color Flow Doppler were utilized during procedure). Indications:     Endocarditis  History:         Patient has prior history of Echocardiogram examinations, most                  recent 07/22/2024. CHF, CAD and Previous Myocardial Infarction;  Risk Factors:Hypertension and Sleep Apnea.  Sonographer:     Jayson Gaskins Referring Phys:  [phone] MANUELITA B ROBERTS Diagnosing Phys: [name] MD PROCEDURE: After discussion of the risks and benefits of a TEE, an informed consent was obtained from the patient. The transesophogeal probe was passed without difficulty through the esophogus of the patient. Sedation performed by different physician. The patient was monitored while under deep sedation. Anesthestetic sedation was provided intravenously by Anesthesiology: 200mg  of Propofol . Image quality was excellent. The patient developed no complications during the procedure.  IMPRESSIONS  1. Large vegetations noted on MV with probable leaflet perforation; severe eccentric MR.  2. Left ventricular ejection fraction, by estimation, is 55 to 60%. The left ventricle has normal function. The left ventricle has no regional wall motion abnormalities.  3. Right ventricular systolic function is normal. The right ventricular size is normal.  4. Left atrial size was severely dilated. No left atrial/left atrial appendage thrombus was detected.  5. The mitral valve is abnormal. Severe mitral valve regurgitation.  6. The aortic valve is tricuspid. Aortic valve regurgitation is not visualized. Aortic valve sclerosis/calcification is present, without any evidence of aortic stenosis.  7. There is Moderate (Grade III) plaque involving the descending aorta.  8. 3D performed of the mitral valve. Conclusion(s)/Recommendation(s): Findings are concerning for vegetation/infective endocarditis as detailed above. Findings concerning for mitral valve vegetation. FINDINGS  Left Ventricle: Left ventricular ejection  fraction, by estimation, is 55 to 60%. The left ventricle has normal function. The left ventricle has no regional wall motion abnormalities. The left ventricular internal cavity size was normal in size. There is  no left ventricular hypertrophy. Right Ventricle: The right ventricular size is normal. Right ventricular systolic function is normal. Left Atrium: Left atrial size was severely dilated. No left atrial/left atrial appendage thrombus was detected. Right Atrium: Right atrial size was normal in size. Pericardium: There is no evidence of pericardial effusion. Mitral Valve: The mitral valve is abnormal. Severe mitral valve regurgitation. Tricuspid Valve: The tricuspid valve is normal in structure. Tricuspid valve regurgitation is mild. Aortic Valve: The aortic valve is tricuspid. Aortic valve regurgitation is not visualized. Aortic valve sclerosis/calcification is present, without any evidence of aortic stenosis. Pulmonic Valve: The pulmonic valve was normal in structure. Pulmonic valve regurgitation is trivial. Aorta: The aortic root is normal in size and structure. There is moderate (Grade III) plaque involving the descending aorta. IAS/Shunts: No atrial level shunt detected by color flow Doppler. Additional Comments: Large vegetations noted on MV with probable leaflet perforation; severe eccentric MR. 3D was performed not requiring image post processing on an independent workstation and was abnormal. [name] MD Electronically signed by [name] MD Signature Date/Time: 07/25/2024/10:20:46 AM    Final    EP STUDY Result Date: 07/25/2024 See surgical note for result.    Scheduled Meds:  amiodarone   200 mg Oral BID   apixaban   5 mg Oral BID   atorvastatin   40 mg Oral Daily   Chlorhexidine  Gluconate Cloth  6 each Topical Daily   empagliflozin   10 mg Oral Daily   furosemide   40 mg Intravenous Q6H   guaiFENesin   600 mg Oral BID   Influenza vac split trivalent PF  0.5 mL Intramuscular  Tomorrow-1000   loratadine   10 mg Oral Daily   magnesium  oxide  800 mg Oral Daily   metoprolol  tartrate  37.5 mg Oral BID   potassium chloride   40 mEq Oral Once   sodium chloride  flush  10-40 mL  Intracatheter Q12H   sodium chloride  flush  3 mL Intravenous Q12H   spironolactone   25 mg Oral Daily   zolpidem   2.5 mg Oral Once   Continuous Infusions:   ceFAZolin  (ANCEF ) IV 2 g (07/25/24 0641)     LOS: 4 days    Time spent:  [name] Pac, MD [facility]   07/25/2024, 11:33 AM

## 2024-07-25 NOTE — Transfer of Care (Signed)
 Immediate Anesthesia Transfer of Care Note  Patient: Philip [name]  Procedure(s) Performed: TRANSESOPHAGEAL ECHOCARDIOGRAM  Patient Location: PACU  Anesthesia Type:MAC  Level of Consciousness: drowsy, patient cooperative, and responds to stimulation  Airway & Oxygen Therapy: Patient Spontanous Breathing and Patient connected to nasal cannula oxygen 6 l/min  Post-op Assessment: Report given to RN and Post -op Vital signs reviewed and stable  Post vital signs: Reviewed and stable  Last Vitals:  Vitals Value Taken Time  BP 109/74 07/25/24 08:50  Temp 36.7 C 07/25/24 08:50  Pulse 77 07/25/24 08:50  Resp 27 07/25/24 08:50  SpO2 94 % 07/25/24 08:50  Vitals shown include unfiled device data.  Last Pain:  Vitals:   07/25/24 0849  TempSrc: Tympanic  PainSc: Asleep      Patients Stated Pain Goal: 0 (07/23/24 1953)  Complications: No notable events documented.

## 2024-07-25 NOTE — Interval H&P Note (Signed)
 History and Physical Interval Note:  07/25/2024 8:26 AM  Philip [name]  has presented today for surgery, with the diagnosis of Severe MR.  The various methods of treatment have been discussed with the patient and family. After consideration of risks, benefits and other options for treatment, the patient has consented to  Procedure(s): TRANSESOPHAGEAL ECHOCARDIOGRAM (N/A) as a surgical intervention.  The patient's history has been reviewed, patient examined, no change in status, stable for surgery.  I have reviewed the patient's chart and labs.  Questions were answered to the patient's satisfaction.     [name]

## 2024-07-25 NOTE — Progress Notes (Signed)
   07/25/24 0101  BiPAP/CPAP/SIPAP  $ Non-Invasive Ventilator  Non-Invasive Vent Subsequent  BiPAP/CPAP/SIPAP Pt Type Adult  BiPAP/CPAP/SIPAP Resmed  Mask Type Full face mask  Dentures removed? Not applicable  Mask Size Medium  Respiratory Rate 18 breaths/min  Flow Rate 3 lpm  Patient Home Machine No  Patient Home Mask No  Patient Home Tubing No  Auto Titrate Yes  CPAP/SIPAP surface wiped down Yes  Device Plugged into RED Power Outlet Yes  BiPAP/CPAP /SiPAP Vitals  Bilateral Breath Sounds Diminished

## 2024-07-25 NOTE — Progress Notes (Signed)
 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  No surgical intervention recommended secondary to risk factors with plan for IV cefazolin  through 07/31/2024.  Now presenting with acute onset shortness of breath and found to have acute on chronic heart failure and acute hypoxemic respiratory failure secondary to acute cardiogenic pulmonary edema.   Philip [name] TEE showed a large vegetations on the mitral valve with probable leaflet perforation and severe eccentric mitral regurgitation.  CVTS reviewed with recommendation for continued IV antibiotics and supportive measures as the risks do not outweigh the benefits of surgery.  Discussed plan of care to continue current dose of cefazolin  through 07/31/2024 as planned.  Continue PICC line care per protocol.  Additional cardiac management per cardiology.  Remaining medical and supportive care per internal medicine.  PLAN:  Continue current dose of cefazolin  through 07/31/2024. PICC line care per protocol. May benefit from palliative care for goals of care. Heart failure management per cardiology. Remaining medical and supportive care per internal medicine.  Principal Problem:   Acute on chronic diastolic CHF (congestive heart failure) (HCC) Active Problems:   Essential hypertension   PAF (paroxysmal atrial fibrillation) (HCC)   Obesity (BMI 30.0-34.9)   CAD (coronary artery disease)   AAA (abdominal aortic aneurysm)   Chronic kidney disease, stage 3a (HCC)   Infective endocarditis of mitral valve   Atrial fibrillation with rapid ventricular response (HCC)   Nonrheumatic mitral valve regurgitation    amiodarone    200 mg Oral BID   apixaban   5 mg Oral BID   atorvastatin   40 mg Oral Daily   Chlorhexidine  Gluconate Cloth  6 each Topical Daily   empagliflozin   10 mg Oral Daily   furosemide   40 mg Intravenous Q6H   guaiFENesin   600 mg Oral BID   Influenza vac split trivalent PF  0.5 mL Intramuscular Tomorrow-1000   loratadine   10 mg Oral Daily   magnesium  oxide  800 mg Oral Daily   metoprolol  tartrate  37.5 mg Oral BID   sodium chloride  flush  10-40 mL Intracatheter Q12H   sodium chloride  flush  3 mL Intravenous Q12H   spironolactone   25 mg Oral Daily   zolpidem   2.5 mg Oral Once    SUBJECTIVE:  Afebrile overnight with no acute events.  Tolerating antibiotics with no adverse side effects.  Family visiting at bedside.  No Known Allergies   Review of Systems: Review of Systems  Constitutional:  Negative for chills, fever and weight loss.  Respiratory:  Negative for cough, shortness of breath and wheezing.   Cardiovascular:  Negative for chest pain and leg swelling.  Gastrointestinal:  Negative for abdominal pain, constipation, diarrhea, nausea and vomiting.  Skin:  Negative for rash.      OBJECTIVE: Vitals:   07/25/24 0859 07/25/24 0909 07/25/24 0935 07/25/24 1038  BP: 113/72 121/79 (!) 144/87 (!) 125/94  Pulse: 73 75 (!) 101 96  Resp: 18 19 18 20   Temp:   97.6 F (36.4 C) 98.1 F (36.7 C)  TempSrc:   Oral Oral  SpO2: 96% 95% 95% 97%  Weight:      Height:  Body mass index is 31.79 kg/m.  Physical Exam Constitutional:      General: Philip [name] is not in acute distress.    Appearance: Philip [name] is well-developed.  Cardiovascular:     Rate and Rhythm: Normal rate and regular rhythm.     Heart sounds: Normal heart sounds.  Pulmonary:     Effort: Pulmonary effort is normal.     Breath sounds: Normal breath sounds.  Skin:    General: Skin is warm and dry.  Neurological:     Mental Status: Philip [name] is alert and oriented to person, place, and time.  Psychiatric:        Mood and Affect: Mood  normal.     Lab Results Lab Results  Component Value Date   WBC 7.7 07/25/2024   HGB 12.5 (L) 07/25/2024   HCT 36.3 (L) 07/25/2024   MCV 93.1 07/25/2024   PLT 81 (L) 07/25/2024    Lab Results  Component Value Date   CREATININE 1.45 (H) 07/25/2024   BUN 29 (H) 07/25/2024   NA 134 (L) 07/25/2024   K 3.7 07/25/2024   CL 95 (L) 07/25/2024   CO2 25 07/25/2024    Lab Results  Component Value Date   ALT <5 07/23/2024   AST 32 07/23/2024   ALKPHOS 57 07/23/2024   BILITOT 1.3 (H) 07/23/2024     Microbiology: Recent Results (from the past 240 hours)  Resp panel by RT-PCR (RSV, Flu A&B, Covid) Anterior Nasal Swab     Status: None   Collection Time: 07/21/24 11:06 AM   Specimen: Anterior Nasal Swab  Result Value Ref Range Status   SARS Coronavirus 2 by RT PCR NEGATIVE NEGATIVE Final    Comment: (NOTE) SARS-CoV-2 target nucleic acids are NOT DETECTED.  The SARS-CoV-2 RNA is generally detectable in upper respiratory specimens during the acute phase of infection. The lowest concentration of SARS-CoV-2 viral copies this assay can detect is 138 copies/mL. A negative result does not preclude SARS-Cov-2 infection and should not be used as the sole basis for treatment or other patient management decisions. A negative result may occur with  improper specimen collection/handling, submission of specimen other than nasopharyngeal swab, presence of viral mutation(s) within the areas targeted by this assay, and inadequate number of viral copies(<138 copies/mL). A negative result must be combined with clinical observations, patient history, and epidemiological information. The expected result is Negative.  Fact Sheet for Patients:  BloggerCourse.com  Fact Sheet for Healthcare Providers:  SeriousBroker.it  This test is no t yet approved or cleared by the [place]  FDA and  has been authorized for detection and/or diagnosis of  SARS-CoV-2 by FDA under an Emergency Use Authorization (EUA). This EUA will remain  in effect (meaning this test can be used) for the duration of the COVID-19 declaration under Section 564(b)(1) of the Act, 21 U.S.C.section 360bbb-3(b)(1), unless the authorization is terminated  or revoked sooner.       Influenza A by PCR NEGATIVE NEGATIVE Final   Influenza B by PCR NEGATIVE NEGATIVE Final    Comment: (NOTE) The Xpert Xpress SARS-CoV-2/FLU/RSV plus assay is intended as an aid in the diagnosis of influenza from Nasopharyngeal swab specimens and should not be used as a sole basis for treatment. Nasal washings and aspirates are unacceptable for Xpert Xpress SARS-CoV-2/FLU/RSV testing.  Fact Sheet for Patients: BloggerCourse.com  Fact Sheet for Healthcare Providers: SeriousBroker.it  This test is not yet approved or cleared by the [place]  FDA and has been authorized for detection  and/or diagnosis of SARS-CoV-2 by FDA under an Emergency Use Authorization (EUA). This EUA will remain in effect (meaning this test can be used) for the duration of the COVID-19 declaration under Section 564(b)(1) of the Act, 21 U.S.C. section 360bbb-3(b)(1), unless the authorization is terminated or revoked.     Resp Syncytial Virus by PCR NEGATIVE NEGATIVE Final    Comment: (NOTE) Fact Sheet for Patients: BloggerCourse.com  Fact Sheet for Healthcare Providers: SeriousBroker.it  This test is not yet approved or cleared by the [place]  FDA and has been authorized for detection and/or diagnosis of SARS-CoV-2 by FDA under an Emergency Use Authorization (EUA). This EUA will remain in effect (meaning this test can be used) for the duration of the COVID-19 declaration under Section 564(b)(1) of the Act, 21 U.S.C. section 360bbb-3(b)(1), unless the authorization is terminated  or revoked.  Performed at [facility], [address] [place], [place] [address]      Cathlyn July, NP [facility]  07/25/2024  2:43 PM

## 2024-07-26 ENCOUNTER — Encounter (HOSPITAL_COMMUNITY): Payer: Self-pay | Admitting: Cardiology

## 2024-07-26 DIAGNOSIS — Z7189 Other specified counseling: Secondary | ICD-10-CM | POA: Diagnosis not present

## 2024-07-26 DIAGNOSIS — I5033 Acute on chronic diastolic (congestive) heart failure: Secondary | ICD-10-CM | POA: Diagnosis not present

## 2024-07-26 DIAGNOSIS — Z515 Encounter for palliative care: Secondary | ICD-10-CM | POA: Diagnosis not present

## 2024-07-26 DIAGNOSIS — I330 Acute and subacute infective endocarditis: Secondary | ICD-10-CM | POA: Diagnosis not present

## 2024-07-26 DIAGNOSIS — I340 Nonrheumatic mitral (valve) insufficiency: Secondary | ICD-10-CM | POA: Diagnosis not present

## 2024-07-26 DIAGNOSIS — I480 Paroxysmal atrial fibrillation: Secondary | ICD-10-CM | POA: Diagnosis not present

## 2024-07-26 LAB — BASIC METABOLIC PANEL WITH GFR
Anion gap: 13 (ref 5–15)
BUN: 32 mg/dL — ABNORMAL HIGH (ref 8–23)
CO2: 26 mmol/L (ref 22–32)
Calcium: 8.8 mg/dL — ABNORMAL LOW (ref 8.9–10.3)
Chloride: 97 mmol/L — ABNORMAL LOW (ref 98–111)
Creatinine, Ser: 1.54 mg/dL — ABNORMAL HIGH (ref 0.61–1.24)
GFR, Estimated: 44 mL/min — ABNORMAL LOW (ref >60)
Glucose, Bld: 112 mg/dL — ABNORMAL HIGH (ref 70–99)
Potassium: 3.8 mmol/L (ref 3.5–5.1)
Sodium: 136 mmol/L (ref 135–145)

## 2024-07-26 MED ORDER — METOPROLOL TARTRATE 50 MG PO TABS
50.0000 mg | ORAL_TABLET | Freq: Two times a day (BID) | ORAL | Status: DC
  Administered 2024-07-26 – 2024-07-28 (×4): 50 mg via ORAL
  Filled 2024-07-26 (×4): qty 1

## 2024-07-26 MED ORDER — POTASSIUM CHLORIDE CRYS ER 20 MEQ PO TBCR
40.0000 meq | EXTENDED_RELEASE_TABLET | Freq: Once | ORAL | Status: AC
  Administered 2024-07-26: 40 meq via ORAL
  Filled 2024-07-26: qty 2

## 2024-07-26 MED ORDER — AMIODARONE HCL 200 MG PO TABS
400.0000 mg | ORAL_TABLET | Freq: Two times a day (BID) | ORAL | Status: DC
Start: 2024-07-26 — End: 2024-07-26

## 2024-07-26 MED ORDER — AMIODARONE HCL 200 MG PO TABS: 200.0000 mg | ORAL_TABLET | Freq: Two times a day (BID) | ORAL | Status: DC

## 2024-07-26 MED ORDER — AMIODARONE HCL 200 MG PO TABS
400.0000 mg | ORAL_TABLET | Freq: Two times a day (BID) | ORAL | Status: DC
  Administered 2024-07-26 – 2024-07-28 (×4): 400 mg via ORAL
  Filled 2024-07-26 (×4): qty 2

## 2024-07-26 MED ORDER — CEFAZOLIN IV (FOR PTA / DISCHARGE USE ONLY): 6.0000 g | INTRAVENOUS | 0 refills | Status: DC

## 2024-07-26 MED ORDER — FUROSEMIDE 10 MG/ML IJ SOLN
80.0000 mg | Freq: Three times a day (TID) | INTRAMUSCULAR | Status: AC
  Administered 2024-07-26 (×2): 80 mg via INTRAVENOUS
  Filled 2024-07-26 (×2): qty 8

## 2024-07-26 MED ORDER — POTASSIUM CHLORIDE CRYS ER 20 MEQ PO TBCR
40.0000 meq | EXTENDED_RELEASE_TABLET | Freq: Once | ORAL | Status: AC
Start: 2024-07-26 — End: 2024-07-26
  Administered 2024-07-26: 40 meq via ORAL
  Filled 2024-07-26: qty 2

## 2024-07-26 MED ORDER — APIXABAN 2.5 MG PO TABS
2.5000 mg | ORAL_TABLET | Freq: Two times a day (BID) | ORAL | Status: DC
  Administered 2024-07-26 – 2024-07-28 (×5): 2.5 mg via ORAL
  Filled 2024-07-26 (×5): qty 1

## 2024-07-26 MED ORDER — AMIODARONE HCL 200 MG PO TABS
200.0000 mg | ORAL_TABLET | Freq: Two times a day (BID) | ORAL | Status: DC
Start: 2024-08-05 — End: 2024-07-26

## 2024-07-26 NOTE — Progress Notes (Signed)
 Rounding Note   Patient Name: Philip [name] Date of Encounter: 07/26/2024  [facility] Cardiologist: Dorn Lesches, MD   Subjective Breathing much better today compared to yesterday. He was actually able to get some sleep last night. No chest pain.   Looks to be in atrial fibrillation with morning with possibly occasional sinus beats. Asymptomatic with this.  Scheduled Meds:  amiodarone   400 mg Oral BID   Followed by   NOREEN ON 08/05/2024] amiodarone   200 mg Oral BID   apixaban   2.5 mg Oral BID   atorvastatin   40 mg Oral Daily   Chlorhexidine  Gluconate Cloth  6 each Topical Daily   empagliflozin   10 mg Oral Daily   furosemide   80 mg Intravenous Q8H   guaiFENesin   600 mg Oral BID   Influenza vac split trivalent PF  0.5 mL Intramuscular Tomorrow-1000   loratadine   10 mg Oral Daily   magnesium  oxide  800 mg Oral Daily   metoprolol  tartrate  50 mg Oral BID   sodium chloride  flush  10-40 mL Intracatheter Q12H   sodium chloride  flush  3 mL Intravenous Q12H   spironolactone   25 mg Oral Daily   Continuous Infusions:   ceFAZolin  (ANCEF ) IV 2 g (07/26/24 1235)   PRN Meds: acetaminophen  **OR** acetaminophen , bisacodyl , hydrALAZINE , melatonin, polyethylene glycol, sodium chloride  flush   Vital Signs  Vitals:   07/26/24 0100 07/26/24 0500 07/26/24 0549 07/26/24 0751  BP: 133/83  (!) 159/84 (!) 144/80  Pulse: (!) 119  87 88  Resp: 19  20 18   Temp: 98.7 F (37.1 C)  (!) 97.5 F (36.4 C) (!) 97.5 F (36.4 C)  TempSrc: Oral  Oral Oral  SpO2: 92%  (!) 89% 96%  Weight:  97.3 kg 97.3 kg   Height:        Intake/Output Summary (Last 24 hours) at 07/26/2024 1247 Last data filed at 07/26/2024 0100 Gross per 24 hour  Intake 363 ml  Output 2600 ml  Net -2237 ml      07/26/2024    5:49 AM 07/26/2024    5:00 AM 07/25/2024    4:04 AM  Last 3 Weights  Weight (lbs) 214 lb 8.1 oz 214 lb 8.1 oz 215 lb 4.8 oz  Weight (kg) 97.3 kg 97.3 kg 97.659 kg      Telemetry In and  out of atrial fibrillation with rates in the 110s. - Personally Reviewed  ECG  No new ECG tracing today. - Personally Reviewed  Physical Exam  Physical Exam per MD: GEN: No acute distress.   Neck: JVD elevated. Cardiac: Tachycardic with irregular rhythm. III/VI systolic murmur. Respiratory: No increased work of breathing. Crackles noted in left base. MS: No lower extremity edema. No deformity. Neuro:  No focal deficits.  Psych: Normal affect. Responds appropriately.   Labs High Sensitivity Troponin:  No results for input(s): TROPONINIHS in the last 720 hours.   Chemistry Recent Labs  Lab 07/22/24 0309 07/23/24 0224 07/24/24 0524 07/24/24 1807 07/25/24 0152 07/25/24 0545 07/26/24 0614  NA 139 138 135 135 134*  --  136  K 4.0 3.0* 2.8* 3.3* 3.7  --  3.8  CL 103 98 95* 95* 95*  --  97*  CO2 23 25 27 26 25   --  26  GLUCOSE 119* 141* 111* 161* 119*  --  112*  BUN 20 21 29* 30* 29*  --  32*  CREATININE 1.33* 1.45* 1.59* 1.54* 1.45*  --  1.54*  CALCIUM  9.0  8.9 8.7* 8.7* 8.8*  --  8.8*  MG 2.1 1.9 2.1  --   --  2.2  --   PROT 6.3* 6.6  --   --   --   --   --   ALBUMIN 3.7 3.6  --   --   --   --   --   AST 26 32  --   --   --   --   --   ALT 6 <5  --   --   --   --   --   ALKPHOS 63 57  --   --   --   --   --   BILITOT 1.3* 1.3*  --   --   --   --   --   GFRNONAA 53* 48* 43* 44* 48*  --  44*  ANIONGAP 13 15 13 14 14   --  13    Lipids No results for input(s): CHOL, TRIG, HDL, LABVLDL, LDLCALC, CHOLHDL in the last 168 hours.  Hematology Recent Labs  Lab 07/22/24 0309 07/23/24 0224 07/25/24 0152  WBC 9.0 9.7 7.7  RBC 4.26 4.34 3.90*  HGB 13.5 13.9 12.5*  HCT 40.5 40.3 36.3*  MCV 95.1 92.9 93.1  MCH 31.7 32.0 32.1  MCHC 33.3 34.5 34.4  RDW 13.8 13.7 13.2  PLT 79* 81* 81*   Thyroid   Recent Labs  Lab 07/22/24 0309 07/22/24 0830  TSH 7.337*  --   FREET4  --  0.92    BNP Recent Labs  Lab 07/21/24 1106  PROBNP 2,281.0*    DDimer No results for  input(s): DDIMER in the last 168 hours.   Radiology  ECHO TEE Result Date: 07/25/2024    TRANSESOPHOGEAL ECHO REPORT   Patient Name:   Philip [name] Date of Exam: 07/25/2024 Medical Rec #:  [id number]      Height:       69.0 in Accession #:    [id number]     Weight:       215.3 lb Date of Birth:  January 18, 1941      BSA:          2.132 m Patient Age:    83 years       BP:           136/89 mmHg Patient Gender: M              HR:           103 bpm. Exam Location:  Inpatient Procedure: Transesophageal Echo, Cardiac Doppler and Color Doppler (Both            Spectral and Color Flow Doppler were utilized during procedure). Indications:     Endocarditis  History:         Patient has prior history of Echocardiogram examinations, most                  recent 07/22/2024. CHF, CAD and Previous Myocardial Infarction;                  Risk Factors:Hypertension and Sleep Apnea.  Sonographer:     Jayson Gaskins Referring Phys:  [phone] MANUELITA B ROBERTS Diagnosing Phys: [name] MD PROCEDURE: After discussion of the risks and benefits of a TEE, an informed consent was obtained from the patient. The transesophogeal probe was passed without difficulty through the esophogus of the patient. Sedation performed by different physician. The patient was monitored while under deep sedation. Anesthestetic  sedation was provided intravenously by Anesthesiology: 200mg  of Propofol . Image quality was excellent. The patient developed no complications during the procedure.  IMPRESSIONS  1. Large vegetations noted on MV with probable leaflet perforation; severe eccentric MR.  2. Left ventricular ejection fraction, by estimation, is 55 to 60%. The left ventricle has normal function. The left ventricle has no regional wall motion abnormalities.  3. Right ventricular systolic function is normal. The right ventricular size is normal.  4. Left atrial size was severely dilated. No left atrial/left atrial appendage thrombus was detected.  5. The mitral  valve is abnormal. Severe mitral valve regurgitation.  6. The aortic valve is tricuspid. Aortic valve regurgitation is not visualized. Aortic valve sclerosis/calcification is present, without any evidence of aortic stenosis.  7. There is Moderate (Grade III) plaque involving the descending aorta.  8. 3D performed of the mitral valve. Conclusion(s)/Recommendation(s): Findings are concerning for vegetation/infective endocarditis as detailed above. Findings concerning for mitral valve vegetation. FINDINGS  Left Ventricle: Left ventricular ejection fraction, by estimation, is 55 to 60%. The left ventricle has normal function. The left ventricle has no regional wall motion abnormalities. The left ventricular internal cavity size was normal in size. There is  no left ventricular hypertrophy. Right Ventricle: The right ventricular size is normal. Right ventricular systolic function is normal. Left Atrium: Left atrial size was severely dilated. No left atrial/left atrial appendage thrombus was detected. Right Atrium: Right atrial size was normal in size. Pericardium: There is no evidence of pericardial effusion. Mitral Valve: The mitral valve is abnormal. Severe mitral valve regurgitation. Tricuspid Valve: The tricuspid valve is normal in structure. Tricuspid valve regurgitation is mild. Aortic Valve: The aortic valve is tricuspid. Aortic valve regurgitation is not visualized. Aortic valve sclerosis/calcification is present, without any evidence of aortic stenosis. Pulmonic Valve: The pulmonic valve was normal in structure. Pulmonic valve regurgitation is trivial. Aorta: The aortic root is normal in size and structure. There is moderate (Grade III) plaque involving the descending aorta. IAS/Shunts: No atrial level shunt detected by color flow Doppler. Additional Comments: Large vegetations noted on MV with probable leaflet perforation; severe eccentric MR. 3D was performed not requiring image post processing on an  independent workstation and was abnormal. [name] MD Electronically signed by [name] MD Signature Date/Time: 07/25/2024/10:20:46 AM    Final    EP STUDY Result Date: 07/25/2024 See surgical note for result.   Cardiac Studies Limited TTE 07/22/2024:  1. Left ventricular ejection fraction, by estimation, is 60 to 65%. The  left ventricle has normal function. Left ventricular endocardial border  not optimally defined to evaluate regional wall motion.   2. Left atrial size was mildly dilated.   3. 2.5 x 1.5 cm mobile vegetation attached to the atrial surface of the  posterior leaflet of the mitral valve near the base of the leaflet. It  prolapses across the leaflet. Mitral regurgitation is present but is not  fully interrogated. Moderate mitral  annular calcification.   4. There is mild calcification of the aortic valve.   5. The inferior vena cava is normal in size with greater than 50%  respiratory variability, suggesting right atrial pressure of 3 mmHg.   6. Very limited echo. There is still a large mitral valve vegetation. The  mitral regurgitation was not fully interrogated. The RV was not  visualized. The aortic valve was not visualized. Would consider full echo  or repeat TEE.  _______________   TEE 07/25/2024: Impressions: 1. Large vegetations noted on  MV with probable leaflet perforation;  severe eccentric MR.   2. Left ventricular ejection fraction, by estimation, is 55 to 60%. The  left ventricle has normal function. The left ventricle has no regional  wall motion abnormalities.   3. Right ventricular systolic function is normal. The right ventricular  size is normal.   4. Left atrial size was severely dilated. No left atrial/left atrial  appendage thrombus was detected.   5. The mitral valve is abnormal. Severe mitral valve regurgitation.   6. The aortic valve is tricuspid. Aortic valve regurgitation is not  visualized. Aortic valve sclerosis/calcification  is present, without any  evidence of aortic stenosis.   7. There is Moderate (Grade III) plaque involving the descending aorta.   8. 3D performed of the mitral valve.   Conclusion(s)/Recommendation(s): Findings are concerning for  vegetation/infective endocarditis as detailed above. Findings concerning  for mitral valve vegetation.   Patient Profile   83 y.o. 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   Assessment & Plan  Acute Diastolic CHF Patient presented with worsening with shortness of breath and volume overload. Likely due to severe MR. Pro BNP 2,281. Chest x-ray suggestive of edema. TTE showed LVEF of 60-65%. He has been intermittent diuresed with IV Lasix . Documented urinary output of 2.1 L yesterday and net negative 7 L this admission. Weight down 10 lbs from admission. Renal function stable. - Breathing improved with resumption of Lasix  yesterday. However, still has crackles noted on exam. - Will continue IV Lasix  80mg  twice daily. - Continue Spironolactone  25mg  daily.  - Continue Jardiance  10mg  daily.  - Continue to monitor daily weights, strict I/Os, and renal function.   Atrial Fibrillation/ Flutter with RVR Patient has a history of paroxysmal atrial fibrillation and was noted to be in atrial fibrillation/ flutter with RVR on admission. He was started on IV Amiodarone  and converted to sinus rhythm on 9/24. - Looks like he has been in and out of atrial fibrillation since overnight. Rates in the 110s.  - Will increase PO Amiodarone  to 400mg  twice daily for 10 days and  then decrease to 200mg  twice daily. - Will increase Lopressor  to 50mg  twice daily. - Currently on Eliquis  5mg  twice daily. However, he meets criteria for reduced dose give age and renal function. Eliquis  was decreased to 2.5mg  twice daily this morning. However, if creatinine improves and stay <1.5 with diuresis, would increase back to full dose.   Mitral Valve Endocarditis Severe Mitral Regurgitation Admitted with MSSA bacteremia secondary to mitral valve endocarditis in 05/2024. TEE during that admission shwoed a large mobile vegetation of mitral valve with mild to moderate MR. He was treated with IV antibiotics. Repeat TEE this admission showed large vegetation with probable leaflet perforation and severe eccentric MR.  - Continue antibiotics per ID. - Continue diuresis as above. - He was seen by CT surgery but not felt to be a candidate for surgery as it was felt that he would likely not make it off the table. Palliative Care as been consulted.    CAD s/p CABG S/p remote CABG in 1999. Last LHC in 2022 showed patent grafts. -  No chest pain. - No Aspirin  given need for anticoagulation. - Continue Eliquis  5mg  twice daily.   Hypertension BP mildly elevated but reasonably well controlled given age.  - Continue Lopressor  and Spironolactone  as above.   Hyperlipidemia - Continue Lipitor 40mg  daily.   CKD Stage III Creatinine 1.44 on admission. Peaked at 1.59 on 9/24. Baseline around 1.2 to 1.4.  - Stable at 1.54 today. - Continue to monitor closely with diuresis.   Hypokalemia  Potassium as low as 2.8 on 9/24. Repleted. - Stable at 3.8 today.  - Will give additional dose of KCl today with ongoing diuresis.   Otherwise, per primary team: - MSSA bacteremia - Elevated TSH - AAA - GERD - Sleep apnea For questions or updates, please contact [facility] Please consult [email or web address] for contact info under       Signed, Oseas Detty E Taysha Majewski, PA-C  07/26/2024, 12:47 PM

## 2024-07-26 NOTE — Progress Notes (Signed)
 PHARMACY CONSULT NOTE FOR:  OUTPATIENT  PARENTERAL ANTIBIOTIC THERAPY (OPAT)  Indication: MSSA mitral valve endocarditis Regimen: cefazolin  6g IV continuous infusion daily  End date: 07/31/2024  IV antibiotic discharge orders are pended. To discharging provider:  please sign these orders via discharge navigator,  Select New Orders & click on the button choice - Manage This Unsigned Work.     Thank you for allowing pharmacy to be a part of this patient's care.  Feliciano Close, PharmD PGY2 Infectious Diseases Pharmacy Resident  07/26/2024 10:43 AM

## 2024-07-26 NOTE — Progress Notes (Signed)
 Mobility Specialist Progress Note:  Pre Mobility: During Mobility: Post Mobility:  Venetia Keel Mobility Specialist Please Contact via SecureChat or Rehab Office at [phone]   07/26/24 1025  Mobility  Activity Ambulated with assistance  Level of Assistance Standby assist, set-up cues, supervision of patient - no hands on  Assistive Device None  Distance Ambulated (ft) 500 ft  Activity Response Tolerated well  Mobility Referral Yes  Mobility visit 1 Mobility  Mobility Specialist Start Time (ACUTE ONLY) 1025  Mobility Specialist Stop Time (ACUTE ONLY) 1049  Mobility Specialist Time Calculation (min) (ACUTE ONLY) 24 min   Pt pleasant and agreeable to session. No c/o any symptoms. Pt originally felt a little off balance when first standing but corrected and felt solid. Pt able to walk and ambulate well. Returned pt to recliner w/ all needs met.

## 2024-07-26 NOTE — Plan of Care (Signed)
 Id brief note  Patient  main issue now is valvular related heart failure -- will defer to cts/cards for management No change in abx course as mentioned He has id clinic f/u with us  next week  Please reengage id if further question

## 2024-07-26 NOTE — Progress Notes (Signed)
 PROGRESS NOTE    Philip [name]  FMW:[id number] DOB: 1941-02-19 DOA: 07/21/2024 PCP: [name], MD   83/M w MV endocarditis, hypertension, CKD, abdominal aortic aneurysm, who presented with dyspnea and orthopnea on day of admission. -labs, cr 1.44 , BNP 2,281  CXR w bilateral hilar vascular congestion, bilateral central interstitial infiltrates, predominant at the lower lobes.  - Admitted, started on diuretics, IV amiodarone  - Repeat echo with concern for larger vegetation on mitral valve - Repeat TEE 9/25 with large vegetation and perforation, severe MR - Reviewed by Dr. Shyrl: Poor surgical candidate   Subjective: Feels fair, no events overnight, breathing continues to improve  Assessment and Plan:  Acute on chronic diastolic CHF -Echo w EF 60 to 34%, 2.5 x 1.5 cm mobile vegetation attached to the atrial surface of the posterior leaflet of the mitral valve near the base of the leaflet. Positive mitral valve regurgitation.  - Improving with diuresis, 7.1 L negative, plan to continue IV Lasix  today - continue Jardiance  and Aldactone  - Increase activity, wean O2  Acute hypoxemic respiratory failure due to acute cardiogenic pulmonary edema.  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  TEE 8/25 noted 1.8 x 1.2 cm vegetation on mitral valve, perforation of the mitral valve, treated with IV cefazolin  to complete therapy on 10/1 -Repeat TEE with larger vegetation, perforation, severe MR -Appreciate reeval by ID -Seen by Dr. Kerrin in August, felt to be a poor surgical candidate then, T CTS reconsulted yesterday and felt the same - Palliative consult for goals of care, discussed poor prognosis with son yesterday  Paroxysmal atrial fibrillation (HCC) Atrial flutter with variable block.  -Now in sinus rhythm, continue metoprolol , Eliquis , po amiodarone , cards  following  Essential hypertension -Stable, meds as above  CAD, CABG No chest pain, elevation high sensitive troponin due to tachycardia and heart failure -Stable, continue statin, DOAC  Chronic kidney disease, stage 3a (HCC) Hypokalemia  -Mild uptrend but relatively stable  AAA (abdominal aortic aneurysm) Follow up as outpatient   Obesity (BMI 30.0-34.9) Calculated BMI is 33.2 consistent in obesity class 1   DVT prophylaxis: apixaban  Code Status: DNR Family Communication: None present, updated son yesterday Disposition Plan:   Consultants:    Procedures:   Antimicrobials:    Objective: Vitals:   07/26/24 0100 07/26/24 0500 07/26/24 0549 07/26/24 0751  BP: 133/83  (!) 159/84 (!) 144/80  Pulse: (!) 119  87 88  Resp: 19  20 18   Temp: 98.7 F (37.1 C)  (!) 97.5 F (36.4 C) (!) 97.5 F (36.4 C)  TempSrc: Oral  Oral Oral  SpO2: 92%  (!) 89% 96%  Weight:  97.3 kg 97.3 kg   Height:        Intake/Output Summary (Last 24 hours) at 07/26/2024 1133 Last data filed at 07/26/2024 0100 Gross per 24 hour  Intake 363 ml  Output 2600 ml  Net -2237 ml   Filed Weights   07/25/24 0404 07/26/24 0500 07/26/24 0549  Weight: 97.7 kg 97.3 kg 97.3 kg    Examination:  Gen: Awake, Alert, Oriented X 3,  HEENT: Positive JVD Lungs: Few basilar rales CVS: S1S2/RRR systolic murmur Abd: soft, Non tender, non distended, BS present Extremities: no edema Skin: no new rashes on exposed skin     Data Reviewed:   CBC: Recent Labs  Lab 07/21/24 1106 07/22/24 0309 07/23/24 0224 07/25/24 0152  WBC 7.5 9.0 9.7  7.7  NEUTROABS 5.3 6.2  --   --   HGB 12.0* 13.5 13.9 12.5*  HCT 34.9* 40.5 40.3 36.3*  MCV 94.8 95.1 92.9 93.1  PLT 76* 79* 81* 81*   Basic Metabolic Panel: Recent Labs  Lab 07/22/24 0309 07/23/24 0224 07/24/24 0524 07/24/24 1807 07/25/24 0152 07/25/24 0545 07/26/24 0614  NA 139 138 135 135 134*  --  136  K 4.0 3.0* 2.8* 3.3* 3.7  --  3.8  CL 103 98 95*  95* 95*  --  97*  CO2 23 25 27 26 25   --  26  GLUCOSE 119* 141* 111* 161* 119*  --  112*  BUN 20 21 29* 30* 29*  --  32*  CREATININE 1.33* 1.45* 1.59* 1.54* 1.45*  --  1.54*  CALCIUM  9.0 8.9 8.7* 8.7* 8.8*  --  8.8*  MG 2.1 1.9 2.1  --   --  2.2  --   PHOS 3.7  --   --   --   --   --   --    GFR: Estimated Creatinine Clearance: 41.1 mL/min (A) (by C-G formula based on SCr of 1.54 mg/dL (H)). Liver Function Tests: Recent Labs  Lab 07/22/24 0309 07/23/24 0224  AST 26 32  ALT 6 <5  ALKPHOS 63 57  BILITOT 1.3* 1.3*  PROT 6.3* 6.6  ALBUMIN 3.7 3.6   No results for input(s): LIPASE, AMYLASE in the last 168 hours. No results for input(s): AMMONIA in the last 168 hours. Coagulation Profile: No results for input(s): INR, PROTIME in the last 168 hours. Cardiac Enzymes: No results for input(s): CKTOTAL, CKMB, CKMBINDEX, TROPONINI in the last 168 hours. BNP (last 3 results) Recent Labs    07/21/24 1106  PROBNP 2,281.0*   HbA1C: No results for input(s): HGBA1C in the last 72 hours. CBG: No results for input(s): GLUCAP in the last 168 hours. Lipid Profile: No results for input(s): CHOL, HDL, LDLCALC, TRIG, CHOLHDL, LDLDIRECT in the last 72 hours. Thyroid  Function Tests: No results for input(s): TSH, T4TOTAL, FREET4, T3FREE, THYROIDAB in the last 72 hours.  Anemia Panel: No results for input(s): VITAMINB12, FOLATE, FERRITIN, TIBC, IRON, RETICCTPCT in the last 72 hours. Urine analysis:    Component Value Date/Time   COLORURINE YELLOW 10/06/2021 1523   APPEARANCEUR CLEAR 10/06/2021 1523   LABSPEC 1.010 10/06/2021 1523   PHURINE 7.0 10/06/2021 1523   GLUCOSEU NEGATIVE 10/06/2021 1523   GLUCOSEU NEGATIVE 04/24/2020 0847   HGBUR NEGATIVE 10/06/2021 1523   HGBUR negative 11/10/2008 1322   BILIRUBINUR NEGATIVE 10/06/2021 1523   KETONESUR NEGATIVE 10/06/2021 1523   PROTEINUR NEGATIVE 10/06/2021 1523   UROBILINOGEN 0.2  04/24/2020 0847   NITRITE NEGATIVE 10/06/2021 1523   LEUKOCYTESUR NEGATIVE 10/06/2021 1523   Sepsis Labs: @LABRCNTIP (procalcitonin:4,lacticidven:4)  ) Recent Results (from the past 240 hours)  Resp panel by RT-PCR (RSV, Flu A&B, Covid) Anterior Nasal Swab     Status: None   Collection Time: 07/21/24 11:06 AM   Specimen: Anterior Nasal Swab  Result Value Ref Range Status   SARS Coronavirus 2 by RT PCR NEGATIVE NEGATIVE Final    Comment: (NOTE) SARS-CoV-2 target nucleic acids are NOT DETECTED.  The SARS-CoV-2 RNA is generally detectable in upper respiratory specimens during the acute phase of infection. The lowest concentration of SARS-CoV-2 viral copies this assay can detect is 138 copies/mL. A negative result does not preclude SARS-Cov-2 infection and should not be used as the sole basis for treatment or other patient management decisions.  A negative result may occur with  improper specimen collection/handling, submission of specimen other than nasopharyngeal swab, presence of viral mutation(s) within the areas targeted by this assay, and inadequate number of viral copies(<138 copies/mL). A negative result must be combined with clinical observations, patient history, and epidemiological information. The expected result is Negative.  Fact Sheet for Patients:  BloggerCourse.com  Fact Sheet for Healthcare Providers:  SeriousBroker.it  This test is no t yet approved or cleared by the [place]  FDA and  has been authorized for detection and/or diagnosis of SARS-CoV-2 by FDA under an Emergency Use Authorization (EUA). This EUA will remain  in effect (meaning this test can be used) for the duration of the COVID-19 declaration under Section 564(b)(1) of the Act, 21 U.S.C.section 360bbb-3(b)(1), unless the authorization is terminated  or revoked sooner.       Influenza A by PCR NEGATIVE NEGATIVE Final   Influenza B by PCR  NEGATIVE NEGATIVE Final    Comment: (NOTE) The Xpert Xpress SARS-CoV-2/FLU/RSV plus assay is intended as an aid in the diagnosis of influenza from Nasopharyngeal swab specimens and should not be used as a sole basis for treatment. Nasal washings and aspirates are unacceptable for Xpert Xpress SARS-CoV-2/FLU/RSV testing.  Fact Sheet for Patients: BloggerCourse.com  Fact Sheet for Healthcare Providers: SeriousBroker.it  This test is not yet approved or cleared by the [place]  FDA and has been authorized for detection and/or diagnosis of SARS-CoV-2 by FDA under an Emergency Use Authorization (EUA). This EUA will remain in effect (meaning this test can be used) for the duration of the COVID-19 declaration under Section 564(b)(1) of the Act, 21 U.S.C. section 360bbb-3(b)(1), unless the authorization is terminated or revoked.     Resp Syncytial Virus by PCR NEGATIVE NEGATIVE Final    Comment: (NOTE) Fact Sheet for Patients: BloggerCourse.com  Fact Sheet for Healthcare Providers: SeriousBroker.it  This test is not yet approved or cleared by the [place]  FDA and has been authorized for detection and/or diagnosis of SARS-CoV-2 by FDA under an Emergency Use Authorization (EUA). This EUA will remain in effect (meaning this test can be used) for the duration of the COVID-19 declaration under Section 564(b)(1) of the Act, 21 U.S.C. section 360bbb-3(b)(1), unless the authorization is terminated or revoked.  Performed at [facility], [address] [place], [place] [address]      Radiology Studies: ECHO TEE Result Date: 07/25/2024    TRANSESOPHOGEAL ECHO REPORT   Patient Name:   Philip [name] Date of Exam: 07/25/2024 Medical Rec #:  [id number]      Height:       69.0 in Accession #:    [id number]     Weight:       215.3 lb Date of Birth:  07-04-1941      BSA:           2.132 m Patient Age:    83 years       BP:           136/89 mmHg Patient Gender: M              HR:           103 bpm. Exam Location:  Inpatient Procedure: Transesophageal Echo, Cardiac Doppler and Color Doppler (Both            Spectral and Color Flow Doppler were utilized during procedure). Indications:     Endocarditis  History:  Patient has prior history of Echocardiogram examinations, most                  recent 07/22/2024. CHF, CAD and Previous Myocardial Infarction;                  Risk Factors:Hypertension and Sleep Apnea.  Sonographer:     Jayson Gaskins Referring Phys:  [phone] MANUELITA B ROBERTS Diagnosing Phys: [name] MD PROCEDURE: After discussion of the risks and benefits of a TEE, an informed consent was obtained from the patient. The transesophogeal probe was passed without difficulty through the esophogus of the patient. Sedation performed by different physician. The patient was monitored while under deep sedation. Anesthestetic sedation was provided intravenously by Anesthesiology: 200mg  of Propofol . Image quality was excellent. The patient developed no complications during the procedure.  IMPRESSIONS  1. Large vegetations noted on MV with probable leaflet perforation; severe eccentric MR.  2. Left ventricular ejection fraction, by estimation, is 55 to 60%. The left ventricle has normal function. The left ventricle has no regional wall motion abnormalities.  3. Right ventricular systolic function is normal. The right ventricular size is normal.  4. Left atrial size was severely dilated. No left atrial/left atrial appendage thrombus was detected.  5. The mitral valve is abnormal. Severe mitral valve regurgitation.  6. The aortic valve is tricuspid. Aortic valve regurgitation is not visualized. Aortic valve sclerosis/calcification is present, without any evidence of aortic stenosis.  7. There is Moderate (Grade III) plaque involving the descending aorta.  8. 3D performed of the mitral  valve. Conclusion(s)/Recommendation(s): Findings are concerning for vegetation/infective endocarditis as detailed above. Findings concerning for mitral valve vegetation. FINDINGS  Left Ventricle: Left ventricular ejection fraction, by estimation, is 55 to 60%. The left ventricle has normal function. The left ventricle has no regional wall motion abnormalities. The left ventricular internal cavity size was normal in size. There is  no left ventricular hypertrophy. Right Ventricle: The right ventricular size is normal. Right ventricular systolic function is normal. Left Atrium: Left atrial size was severely dilated. No left atrial/left atrial appendage thrombus was detected. Right Atrium: Right atrial size was normal in size. Pericardium: There is no evidence of pericardial effusion. Mitral Valve: The mitral valve is abnormal. Severe mitral valve regurgitation. Tricuspid Valve: The tricuspid valve is normal in structure. Tricuspid valve regurgitation is mild. Aortic Valve: The aortic valve is tricuspid. Aortic valve regurgitation is not visualized. Aortic valve sclerosis/calcification is present, without any evidence of aortic stenosis. Pulmonic Valve: The pulmonic valve was normal in structure. Pulmonic valve regurgitation is trivial. Aorta: The aortic root is normal in size and structure. There is moderate (Grade III) plaque involving the descending aorta. IAS/Shunts: No atrial level shunt detected by color flow Doppler. Additional Comments: Large vegetations noted on MV with probable leaflet perforation; severe eccentric MR. 3D was performed not requiring image post processing on an independent workstation and was abnormal. [name] MD Electronically signed by [name] MD Signature Date/Time: 07/25/2024/10:20:46 AM    Final    EP STUDY Result Date: 07/25/2024 See surgical note for result.    Scheduled Meds:  amiodarone   400 mg Oral BID   Followed by   NOREEN ON 08/05/2024] amiodarone   200 mg  Oral BID   apixaban   2.5 mg Oral BID   atorvastatin   40 mg Oral Daily   Chlorhexidine  Gluconate Cloth  6 each Topical Daily   empagliflozin   10 mg Oral Daily   furosemide   80 mg  Intravenous Q8H   guaiFENesin   600 mg Oral BID   Influenza vac split trivalent PF  0.5 mL Intramuscular Tomorrow-1000   loratadine   10 mg Oral Daily   magnesium  oxide  800 mg Oral Daily   metoprolol  tartrate  50 mg Oral BID   potassium chloride   40 mEq Oral Once   sodium chloride  flush  10-40 mL Intracatheter Q12H   sodium chloride  flush  3 mL Intravenous Q12H   spironolactone   25 mg Oral Daily   Continuous Infusions:   ceFAZolin  (ANCEF ) IV 2 g (07/26/24 0552)     LOS: 5 days    Time spent:  [name] Pac, MD [facility]   07/26/2024, 11:33 AM

## 2024-07-26 NOTE — Progress Notes (Signed)
   07/26/24 1922  BiPAP/CPAP/SIPAP  Reason BIPAP/CPAP not in use Non-compliant (Pt states he does not want to wear CPAP tonight.)  BiPAP/CPAP /SiPAP Vitals  Temp 97.8 F (36.6 C)  Pulse Rate 84  Resp 18  BP (!) 158/84  MEWS Score/Color  MEWS Score 0  MEWS Score Color Green

## 2024-07-26 NOTE — Consult Note (Signed)
 Palliative Care Consult Note                                  Date: 07/26/2024   Patient Name: Philip [name]  DOB: 11-May-1941  MRN: [id number]  Age / Sex: 83 y.o., male  PCP: [name], MD Referring Physician: Fairy Frames, MD  Reason for Consultation: Establishing goals of care  HPI/Patient Profile: 83 y.o. male  with past medical history of A-fib on Eliquis , AAA s/p stenting, CAD s/p CABG in 1999, and CKD stage IIIa who presented to the ED on 07/21/2024 with shortness of breath.  He is admitted with infective mitral valve endocarditis, severe mitral regurgitation, acute on chronic CHF, and acute respiratory failure in the setting of acute cardiogenic pulmonary edema  Palliative Medicine has been consulted for goals of care discussions and complex medical decision making.   Clinical Assessment and Goals of Care:   Extensive chart review has been completed including labs, vital signs, imaging, progress/consult notes, orders, medications and available advance directive documents.    I met with patient and his wife/Philip [name] to discuss diagnosis, prognosis, and GOC. Patient is alert/oriented and sitting up in the recliner. He states his breathing is much improved.    I introduced Palliative Medicine as specialized medical care for people living with serious illness. It focuses on providing relief from the symptoms and stress of a serious illness.   Created space and opportunity for patient and wife to express thoughts and feelings regarding current medical situation. Values and goals of care were attempted to be elicited.  Life Review: Philip [name] have been married for 40 years. He is retired. Prior to the hospitalization in August, Philip [name] reports being quite active. He was an avid golfer and enjoyed going to the gym 3 times per week.   Discussion: We discussed patient's current illness and what it means in the larger context of his  ongoing co-morbidities. Current clinical status was reviewed.  We reviewed his issues of endocarditis and severe mitral regurgitation. Philip [name] expresses that his current medical situation has been quite a shock; he states it was a rude awakening. He understands the seriousness of his current situation. He understands that per CVTS, he is not a surgical candidate. He understands that without surgical intervention, the infection will most likely be recurrent.   Philip [name] states his goal is for medical stabilization and recovery to the extent this is possible. He is hopeful to return to some of the activities he previously enjoyed (golf, gym). He speaks to the importance of quality of life, and is clear he would not want life prolonging interventions in the event his condition were to deteriorate and he was in a debilitated state.   The MOST form was introduced and I provided a blank copy for patient and wife to review.  We briefly reviewed concepts of code status, scopes of care, antibiotics, IV fluids, hospitalization.  Discussed the importance of continued conversation with the medical team regarding overall plan of care and treatment options, ensuring decisions are within the context of the patients values and GOCs.  Questions and concerns addressed. Patient has expressed he wants to have oxygen at home - I have passed this along to Dr. Fairy.    Review of Systems  Respiratory:         Shortness of breath with exertion    Objective:   Primary Diagnoses: Present on Admission:  Essential hypertension  PAF (paroxysmal atrial fibrillation) (HCC)  Obesity (BMI 30.0-34.9)  CAD (coronary artery disease)  AAA (abdominal aortic aneurysm)   Physical Exam Vitals reviewed.  Constitutional:      General: He is not in acute distress. Cardiovascular:     Rate and Rhythm: Normal rate.  Pulmonary:     Effort: Pulmonary effort is normal.  Neurological:     Mental Status: He is alert and oriented  to person, place, and time.      Palliative Assessment/Data: PPS 60%     Assessment & Plan:   SUMMARY OF RECOMMENDATIONS   Continue current scope of care Goal of care is medical stabilization and recovery to the extent this is possible PMT will continue to follow  Primary Decision Maker: PATIENT  Existing Vynca/ACP Documentation: HCPOA document designating Philip [name] (wife) and Philip [name] (son) as healthcare agents  Code Status/Advance Care Planning: DNR with limited interventions  Prognosis:  guarded  Discharge Planning:  Home with Home Health   Discussed with: Dr. Fairy   Thank you for allowing us  to participate in the care of Philip [name]   Time Total: 76 minutes  Detailed review of medical records (labs, imaging, vital signs), medically appropriate exam, discussed with treatment team, counseling and education to patient, family, & staff, documenting clinical information, coordination of care.   Signed by: Recardo Loll, NP Palliative Medicine Team  Team Phone # [phone]  For individual providers, please see AMION

## 2024-07-26 NOTE — Progress Notes (Signed)
 Occupational Therapy Treatment Patient Details Name: Philip [name] MRN: [id number] DOB: 1941/06/08 Today's Date: 07/26/2024   History of present illness 83 yo male adm 07/21/24 with SOB. 9/22 Rapid Afib with RVR. 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 Afib, CAD s/p CABG, PAD, OSA, AAA stenting, penile prothesis, HTN, HLD, skin CA   OT comments  Pt progressing well towards goals. Progressed to supervision level with ADLs, including shower transfers. Provided and reviewed energy conservation strategies for ADLs. Pt verbalizing understanding, but reports frustration with decreased activity tolerance and strength. Continue to recommend HHOT to optimize independence levels. Will continue to follow acutely.       If plan is discharge home, recommend the following:  Assistance with cooking/housework;Help with stairs or ramp for entrance   Equipment Recommendations  None recommended by OT       Precautions / Restrictions Precautions Precautions: Fall;Other (comment) Recall of Precautions/Restrictions: Intact Precaution/Restrictions Comments: watch HR Restrictions Weight Bearing Restrictions Per Provider Order: No       Mobility Bed Mobility     General bed mobility comments: Received in recliner    Transfers Overall transfer level: Needs assistance Equipment used: None Transfers: Sit to/from Stand Sit to Stand: Supervision           General transfer comment: S for safety     Balance Overall balance assessment: No apparent balance deficits (not formally assessed)       ADL either performed or assessed with clinical judgement   ADL Overall ADL's : Needs assistance/impaired     Toilet Transfer: Supervision/safety;Regular Toilet;Ambulation;Grab bars       Tub/ Shower Transfer: Supervision/safety;Shower seat;Ambulation   Functional mobility during ADLs: Supervision/safety General ADL Comments: Provided and  reviewed energy conservation strategies for ADLs    Extremity/Trunk Assessment Upper Extremity Assessment Upper Extremity Assessment: Overall WFL for tasks assessed   Lower Extremity Assessment Lower Extremity Assessment: Defer to PT evaluation        Vision   Vision Assessment?: No apparent visual deficits         Communication Communication Communication: No apparent difficulties   Cognition Arousal: Alert Behavior During Therapy: WFL for tasks assessed/performed Cognition: No apparent impairments     Following commands: Intact        Cueing   Cueing Techniques: Verbal cues        General Comments VSS on RA    Pertinent Vitals/ Pain       Pain Assessment Pain Assessment: No/denies pain   Frequency  Min 2X/week        Progress Toward Goals  OT Goals(current goals can now be found in the care plan section)  Progress towards OT goals: Progressing toward goals  Acute Rehab OT Goals Patient Stated Goal: To go home OT Goal Formulation: With patient/family Time For Goal Achievement: 08/06/24 Potential to Achieve Goals: Good ADL Goals Pt Will Perform Upper Body Bathing: with modified independence;sitting Pt Will Perform Lower Body Bathing: with modified independence;sit to/from stand Additional ADL Goal #1: pt will demonstrate 2 energy conservation strategies  Plan         AM-PAC OT 6 Clicks Daily Activity     Outcome Measure   Help from another person eating meals?: None Help from another person taking care of personal grooming?: None Help from another person toileting, which includes using toliet, bedpan, or urinal?: A Little Help from another person bathing (including washing, rinsing, drying)?: A Little Help from another person  to put on and taking off regular upper body clothing?: None Help from another person to put on and taking off regular lower body clothing?: A Little 6 Click Score: 21    End of Session    OT Visit Diagnosis:  Unsteadiness on feet (R26.81);Muscle weakness (generalized) (M62.81)   Activity Tolerance Patient tolerated treatment well   Patient Left in chair;with call bell/phone within reach;with family/visitor present   Nurse Communication Mobility status        Time: 1337-1401 OT Time Calculation (min): 24 min  Charges: OT General Charges $OT Visit: 1 Visit OT Treatments $Self Care/Home Management : 23-37 mins  Adrianne BROCKS, OT  Acute Rehabilitation Services Office [phone] Secure chat preferred   Adrianne GORMAN Savers 07/26/2024, 3:00 PM

## 2024-07-27 ENCOUNTER — Other Ambulatory Visit: Payer: Self-pay | Admitting: Internal Medicine

## 2024-07-27 DIAGNOSIS — I509 Heart failure, unspecified: Secondary | ICD-10-CM | POA: Diagnosis not present

## 2024-07-27 DIAGNOSIS — Z515 Encounter for palliative care: Secondary | ICD-10-CM | POA: Diagnosis not present

## 2024-07-27 DIAGNOSIS — Z7189 Other specified counseling: Secondary | ICD-10-CM | POA: Diagnosis not present

## 2024-07-27 DIAGNOSIS — I480 Paroxysmal atrial fibrillation: Secondary | ICD-10-CM | POA: Diagnosis not present

## 2024-07-27 DIAGNOSIS — I330 Acute and subacute infective endocarditis: Secondary | ICD-10-CM | POA: Diagnosis not present

## 2024-07-27 DIAGNOSIS — I340 Nonrheumatic mitral (valve) insufficiency: Secondary | ICD-10-CM | POA: Diagnosis not present

## 2024-07-27 DIAGNOSIS — I5033 Acute on chronic diastolic (congestive) heart failure: Secondary | ICD-10-CM | POA: Diagnosis not present

## 2024-07-27 LAB — BASIC METABOLIC PANEL WITH GFR
Anion gap: 13 (ref 5–15)
BUN: 38 mg/dL — ABNORMAL HIGH (ref 8–23)
CO2: 26 mmol/L (ref 22–32)
Calcium: 8.8 mg/dL — ABNORMAL LOW (ref 8.9–10.3)
Chloride: 96 mmol/L — ABNORMAL LOW (ref 98–111)
Creatinine, Ser: 1.69 mg/dL — ABNORMAL HIGH (ref 0.61–1.24)
GFR, Estimated: 40 mL/min — ABNORMAL LOW (ref >60)
Glucose, Bld: 98 mg/dL (ref 70–99)
Potassium: 4.1 mmol/L (ref 3.5–5.1)
Sodium: 135 mmol/L (ref 135–145)

## 2024-07-27 MED ORDER — POLYETHYLENE GLYCOL 3350 17 G PO PACK
17.0000 g | PACK | Freq: Two times a day (BID) | ORAL | Status: DC
  Administered 2024-07-27 (×2): 17 g via ORAL
  Filled 2024-07-27 (×2): qty 1

## 2024-07-27 MED ORDER — FUROSEMIDE 40 MG PO TABS
40.0000 mg | ORAL_TABLET | Freq: Every day | ORAL | Status: DC
  Administered 2024-07-27: 40 mg via ORAL
  Filled 2024-07-27: qty 1

## 2024-07-27 NOTE — Progress Notes (Signed)
 PROGRESS NOTE    Philip [name]  FMW:[id number] DOB: Mar 20, 1941 DOA: 07/21/2024 PCP: [name], MD   83/M w MV endocarditis, hypertension, CKD, abdominal aortic aneurysm, who presented with dyspnea and orthopnea on day of admission. -labs, cr 1.44 , BNP 2,281  CXR w bilateral hilar vascular congestion, bilateral central interstitial infiltrates, predominant at the lower lobes.  - Admitted, started on diuretics, IV amiodarone  - Repeat echo with concern for larger vegetation on mitral valve - Repeat TEE 9/25 with large vegetation and perforation, severe MR - Reviewed by Dr. Shyrl: Poor surgical candidate - 9/25: Palliative care consulted   Subjective: -Feels okay, no events overnight, on 4 L O2 this morning  Assessment and Plan:  Acute on chronic diastolic CHF -Echo w EF 60 to 34%, 2.5 x 1.5 cm mobile vegetation attached to the atrial surface of the posterior leaflet of the mitral valve near the base of the leaflet. Positive mitral valve regurgitation.  - Improving with diuresis, 9 L negative, switch to oral diuretics today - continue Jardiance  and Aldactone  - Increase activity, wean O2  Acute hypoxemic respiratory failure due to acute cardiogenic pulmonary edema.  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  TEE 8/25 noted 1.8 x 1.2 cm vegetation on mitral valve, perforation of the mitral valve, treated with IV cefazolin  to complete therapy on 10/1 -Repeat TEE with larger vegetation, perforation, severe MR -Appreciate reeval by ID -Seen by Dr. Kerrin in August, felt to be a poor surgical candidate then, T CTS reconsulted and felt the same - Palliative consult for goals of care, discussed poor prognosis with son, may need hospice in the near future  Paroxysmal atrial fibrillation (HCC) Atrial flutter with variable block.  -Now in sinus rhythm, continue metoprolol ,  Eliquis , po amiodarone , cards following  Essential hypertension -Stable, meds as above  CAD, CABG No chest pain, elevation high sensitive troponin due to tachycardia and heart failure -Stable, continue statin, DOAC  Chronic kidney disease, stage 3a (HCC) Hypokalemia  -Mild uptrend but relatively stable  AAA (abdominal aortic aneurysm) Follow up as outpatient   Obesity (BMI 30.0-34.9) Calculated BMI is 33.2 consistent in obesity class 1   DVT prophylaxis: apixaban  Code Status: DNR Family Communication: None present, updated son 9/26 Disposition Plan:   Consultants:    Procedures:   Antimicrobials:    Objective: Vitals:   07/26/24 1922 07/26/24 2245 07/27/24 0438 07/27/24 0933  BP: (!) 158/84 (!) 159/90 130/84 (!) 153/81  Pulse: 84 91 99 73  Resp: 18 16 20 20   Temp: 97.8 F (36.6 C) 98.2 F (36.8 C) 98.2 F (36.8 C) 97.8 F (36.6 C)  TempSrc: Oral Oral Oral Oral  SpO2: 96% 94% 94% 96%  Weight:   96 kg   Height:        Intake/Output Summary (Last 24 hours) at 07/27/2024 1057 Last data filed at 07/27/2024 1000 Gross per 24 hour  Intake 240 ml  Output 2200 ml  Net -1960 ml   Filed Weights   07/26/24 0500 07/26/24 0549 07/27/24 0438  Weight: 97.3 kg 97.3 kg 96 kg    Examination:  Gen: Awake, Alert, Oriented X 3,  HEENT: no JVD Lungs: Few basilar rales CVS: S1S2/RRR systolic murmur Abd: soft, Non tender, non distended, BS present Extremities: Trace edema Skin: no new rashes on exposed skin     Data Reviewed:   CBC: Recent Labs  Lab 07/21/24 1106 07/22/24  0309 07/23/24 0224 07/25/24 0152  WBC 7.5 9.0 9.7 7.7  NEUTROABS 5.3 6.2  --   --   HGB 12.0* 13.5 13.9 12.5*  HCT 34.9* 40.5 40.3 36.3*  MCV 94.8 95.1 92.9 93.1  PLT 76* 79* 81* 81*   Basic Metabolic Panel: Recent Labs  Lab 07/22/24 0309 07/23/24 0224 07/24/24 0524 07/24/24 1807 07/25/24 0152 07/25/24 0545 07/26/24 0614 07/27/24 0525  NA 139 138 135 135 134*  --  136 135  K  4.0 3.0* 2.8* 3.3* 3.7  --  3.8 4.1  CL 103 98 95* 95* 95*  --  97* 96*  CO2 23 25 27 26 25   --  26 26  GLUCOSE 119* 141* 111* 161* 119*  --  112* 98  BUN 20 21 29* 30* 29*  --  32* 38*  CREATININE 1.33* 1.45* 1.59* 1.54* 1.45*  --  1.54* 1.69*  CALCIUM  9.0 8.9 8.7* 8.7* 8.8*  --  8.8* 8.8*  MG 2.1 1.9 2.1  --   --  2.2  --   --   PHOS 3.7  --   --   --   --   --   --   --    GFR: Estimated Creatinine Clearance: 37.2 mL/min (A) (by C-G formula based on SCr of 1.69 mg/dL (H)). Liver Function Tests: Recent Labs  Lab 07/22/24 0309 07/23/24 0224  AST 26 32  ALT 6 <5  ALKPHOS 63 57  BILITOT 1.3* 1.3*  PROT 6.3* 6.6  ALBUMIN 3.7 3.6   No results for input(s): LIPASE, AMYLASE in the last 168 hours. No results for input(s): AMMONIA in the last 168 hours. Coagulation Profile: No results for input(s): INR, PROTIME in the last 168 hours. Cardiac Enzymes: No results for input(s): CKTOTAL, CKMB, CKMBINDEX, TROPONINI in the last 168 hours. BNP (last 3 results) Recent Labs    07/21/24 1106  PROBNP 2,281.0*   HbA1C: No results for input(s): HGBA1C in the last 72 hours. CBG: No results for input(s): GLUCAP in the last 168 hours. Lipid Profile: No results for input(s): CHOL, HDL, LDLCALC, TRIG, CHOLHDL, LDLDIRECT in the last 72 hours. Thyroid  Function Tests: No results for input(s): TSH, T4TOTAL, FREET4, T3FREE, THYROIDAB in the last 72 hours.  Anemia Panel: No results for input(s): VITAMINB12, FOLATE, FERRITIN, TIBC, IRON, RETICCTPCT in the last 72 hours. Urine analysis:    Component Value Date/Time   COLORURINE YELLOW 10/06/2021 1523   APPEARANCEUR CLEAR 10/06/2021 1523   LABSPEC 1.010 10/06/2021 1523   PHURINE 7.0 10/06/2021 1523   GLUCOSEU NEGATIVE 10/06/2021 1523   GLUCOSEU NEGATIVE 04/24/2020 0847   HGBUR NEGATIVE 10/06/2021 1523   HGBUR negative 11/10/2008 1322   BILIRUBINUR NEGATIVE 10/06/2021 1523   KETONESUR  NEGATIVE 10/06/2021 1523   PROTEINUR NEGATIVE 10/06/2021 1523   UROBILINOGEN 0.2 04/24/2020 0847   NITRITE NEGATIVE 10/06/2021 1523   LEUKOCYTESUR NEGATIVE 10/06/2021 1523   Sepsis Labs: @LABRCNTIP (procalcitonin:4,lacticidven:4)  ) Recent Results (from the past 240 hours)  Resp panel by RT-PCR (RSV, Flu A&B, Covid) Anterior Nasal Swab     Status: None   Collection Time: 07/21/24 11:06 AM   Specimen: Anterior Nasal Swab  Result Value Ref Range Status   SARS Coronavirus 2 by RT PCR NEGATIVE NEGATIVE Final    Comment: (NOTE) SARS-CoV-2 target nucleic acids are NOT DETECTED.  The SARS-CoV-2 RNA is generally detectable in upper respiratory specimens during the acute phase of infection. The lowest concentration of SARS-CoV-2 viral copies this assay can detect is  138 copies/mL. A negative result does not preclude SARS-Cov-2 infection and should not be used as the sole basis for treatment or other patient management decisions. A negative result may occur with  improper specimen collection/handling, submission of specimen other than nasopharyngeal swab, presence of viral mutation(s) within the areas targeted by this assay, and inadequate number of viral copies(<138 copies/mL). A negative result must be combined with clinical observations, patient history, and epidemiological information. The expected result is Negative.  Fact Sheet for Patients:  BloggerCourse.com  Fact Sheet for Healthcare Providers:  SeriousBroker.it  This test is no t yet approved or cleared by the [place]  FDA and  has been authorized for detection and/or diagnosis of SARS-CoV-2 by FDA under an Emergency Use Authorization (EUA). This EUA will remain  in effect (meaning this test can be used) for the duration of the COVID-19 declaration under Section 564(b)(1) of the Act, 21 U.S.C.section 360bbb-3(b)(1), unless the authorization is terminated  or revoked  sooner.       Influenza A by PCR NEGATIVE NEGATIVE Final   Influenza B by PCR NEGATIVE NEGATIVE Final    Comment: (NOTE) The Xpert Xpress SARS-CoV-2/FLU/RSV plus assay is intended as an aid in the diagnosis of influenza from Nasopharyngeal swab specimens and should not be used as a sole basis for treatment. Nasal washings and aspirates are unacceptable for Xpert Xpress SARS-CoV-2/FLU/RSV testing.  Fact Sheet for Patients: BloggerCourse.com  Fact Sheet for Healthcare Providers: SeriousBroker.it  This test is not yet approved or cleared by the [place]  FDA and has been authorized for detection and/or diagnosis of SARS-CoV-2 by FDA under an Emergency Use Authorization (EUA). This EUA will remain in effect (meaning this test can be used) for the duration of the COVID-19 declaration under Section 564(b)(1) of the Act, 21 U.S.C. section 360bbb-3(b)(1), unless the authorization is terminated or revoked.     Resp Syncytial Virus by PCR NEGATIVE NEGATIVE Final    Comment: (NOTE) Fact Sheet for Patients: BloggerCourse.com  Fact Sheet for Healthcare Providers: SeriousBroker.it  This test is not yet approved or cleared by the [place]  FDA and has been authorized for detection and/or diagnosis of SARS-CoV-2 by FDA under an Emergency Use Authorization (EUA). This EUA will remain in effect (meaning this test can be used) for the duration of the COVID-19 declaration under Section 564(b)(1) of the Act, 21 U.S.C. section 360bbb-3(b)(1), unless the authorization is terminated or revoked.  Performed at [facility], [address] [place], [place] [address]      Radiology Studies: No results found.    Scheduled Meds:  amiodarone   400 mg Oral BID   Followed by   NOREEN ON 08/05/2024] amiodarone   200 mg Oral BID   apixaban   2.5 mg Oral BID   atorvastatin   40 mg  Oral Daily   Chlorhexidine  Gluconate Cloth  6 each Topical Daily   empagliflozin   10 mg Oral Daily   guaiFENesin   600 mg Oral BID   Influenza vac split trivalent PF  0.5 mL Intramuscular Tomorrow-1000   loratadine   10 mg Oral Daily   magnesium  oxide  800 mg Oral Daily   metoprolol  tartrate  50 mg Oral BID   polyethylene glycol  17 g Oral BID   sodium chloride  flush  10-40 mL Intracatheter Q12H   sodium chloride  flush  3 mL Intravenous Q12H   spironolactone   25 mg Oral Daily   Continuous Infusions:   ceFAZolin  (ANCEF ) IV 2 g (07/27/24 0525)  LOS: 6 days    Time spent:  [name] Pac, MD [facility]   07/27/2024, 10:57 AM

## 2024-07-27 NOTE — Progress Notes (Signed)
  Progress Note  Patient Name: Philip [name] Date of Encounter: 07/27/2024 [facility] Cardiologist: Dorn Lesches, MD   Interval Summary   No chest discomfort.  Occasional atrial fibrillation episodes. Creatinine has ranged from 1.3-1.7.  Furosemide  on hold.  Vital Signs Vitals:   07/26/24 1921 07/26/24 1922 07/26/24 2245 07/27/24 0438  BP:  (!) 158/84 (!) 159/90 130/84  Pulse: 84 84 91 99  Resp: 18 18 16 20   Temp:  97.8 F (36.6 C) 98.2 F (36.8 C) 98.2 F (36.8 C)  TempSrc: Oral Oral Oral Oral  SpO2:  96% 94% 94%  Weight:    96 kg  Height:        Intake/Output Summary (Last 24 hours) at 07/27/2024 0920 Last data filed at 07/27/2024 0439 Gross per 24 hour  Intake --  Output 1750 ml  Net -1750 ml      07/27/2024    4:38 AM 07/26/2024    5:49 AM 07/26/2024    5:00 AM  Last 3 Weights  Weight (lbs) 211 lb 10.3 oz 214 lb 8.1 oz 214 lb 8.1 oz  Weight (kg) 96 kg 97.3 kg 97.3 kg      Telemetry/ECG  Currently normal sinus rhythm, no atrial fibrillation as was seen yesterday- Personally Reviewed  Physical Exam  GEN: No acute distress.   Neck: No JVD Cardiac: Normal sinus rhythm 3/6 systolic murmur, no rubs, or gallops.  Respiratory: Crackles left base. GI: Soft, nontender, non-distended  MS: No edema  Assessment & Plan   83 year old male with coronary disease status post CABG x 3 LIMA to LAD SVG to OM SVG to PDA with mitral valve endocarditis with severe eccentric mitral regurgitation paroxysmal atrial fibrillation and chronic diastolic heart failure  Acute on chronic diastolic heart failure - Acute decompensation with volume overload exacerbated by atrial fibrillation with RVR and severe mitral regurgitation as a result of endocarditis - On 07/26/2024 his IV Lasix  was increased to 80 mg twice a day.  Currently on hold after creatinine came back at 1.7 this morning. -Continue with spironolactone  25 mg a day -Metoprolol  to tartrate currently 50 mg twice a  day -On Jardiance  10 mg once a day, SGLT2 inhibitor --consider Lasix  40 mg daily PO as outpatient. May need more.   Paroxysmal atrial fibrillation - Has had intermittent atrial fibrillation while here in the hospital. - Amiodarone  was increased to 400 mg twice a day for 10 days followed by 200 mg twice daily -Metoprolol  was increased to 50 mg twice a day  Mitral valve endocarditis with mitral regurgitation  - Perforation noted with large oscillating vegetation and severe eccentric mitral regurgitation On cefazolin  until 07/31/2024 -Discussed with cardiothoracic surgery team.  Not deemed a surgical candidate unlikely to survive major redo surgery -Continue with palliative care, DNR. --He states he is hopeful for DC tomorrow to be with his bride on her birthday.    For questions or updates, please contact [facility] Please consult [email or web address] for contact info under         Signed, Oneil Parchment, MD

## 2024-07-27 NOTE — Plan of Care (Signed)
  Problem: Health Behavior/Discharge Planning: Goal: Ability to manage health-related needs will improve Outcome: Progressing   Problem: Clinical Measurements: Goal: Ability to maintain clinical measurements within normal limits will improve Outcome: Progressing   Problem: Nutrition: Goal: Adequate nutrition will be maintained Outcome: Progressing   

## 2024-07-27 NOTE — Progress Notes (Signed)
 Mobility Specialist Progress Note:    07/27/24 1023  Mobility  Activity Ambulated with assistance  Level of Assistance Standby assist, set-up cues, supervision of patient - no hands on  Assistive Device None  Distance Ambulated (ft) 500 ft  Activity Response Tolerated well  Mobility Referral Yes  Mobility visit 1 Mobility  Mobility Specialist Start Time (ACUTE ONLY) 1023  Mobility Specialist Stop Time (ACUTE ONLY) 1045  Mobility Specialist Time Calculation (min) (ACUTE ONLY) 22 min   Received pt in bed pleasant and agreeable to session. No c/o any symptoms. Pt moving and ambulating well. Pt seemed to have a more steady gait and stated they were feeling stronger today. Pt able to ambulate w/o O2 w/ sat staying between 91% and 93%. Returned pt to recliner w/ all needs met.   Venetia Keel Mobility Specialist Please Neurosurgeon or Rehab Office at [phone]

## 2024-07-28 ENCOUNTER — Other Ambulatory Visit (HOSPITAL_COMMUNITY): Payer: Self-pay

## 2024-07-28 DIAGNOSIS — R7881 Bacteremia: Secondary | ICD-10-CM | POA: Diagnosis not present

## 2024-07-28 DIAGNOSIS — I5033 Acute on chronic diastolic (congestive) heart failure: Secondary | ICD-10-CM | POA: Diagnosis not present

## 2024-07-28 DIAGNOSIS — T8149XA Infection following a procedure, other surgical site, initial encounter: Secondary | ICD-10-CM | POA: Diagnosis not present

## 2024-07-28 DIAGNOSIS — B9561 Methicillin susceptible Staphylococcus aureus infection as the cause of diseases classified elsewhere: Secondary | ICD-10-CM | POA: Diagnosis not present

## 2024-07-28 DIAGNOSIS — I480 Paroxysmal atrial fibrillation: Secondary | ICD-10-CM | POA: Diagnosis not present

## 2024-07-28 LAB — BASIC METABOLIC PANEL WITH GFR
Anion gap: 9 (ref 5–15)
BUN: 38 mg/dL — ABNORMAL HIGH (ref 8–23)
CO2: 25 mmol/L (ref 22–32)
Calcium: 8.8 mg/dL — ABNORMAL LOW (ref 8.9–10.3)
Chloride: 101 mmol/L (ref 98–111)
Creatinine, Ser: 1.59 mg/dL — ABNORMAL HIGH (ref 0.61–1.24)
GFR, Estimated: 43 mL/min — ABNORMAL LOW (ref >60)
Glucose, Bld: 110 mg/dL — ABNORMAL HIGH (ref 70–99)
Potassium: 4.4 mmol/L (ref 3.5–5.1)
Sodium: 135 mmol/L (ref 135–145)

## 2024-07-28 MED ORDER — AMIODARONE HCL 200 MG PO TABS: ORAL_TABLET | ORAL | 0 refills | Status: DC

## 2024-07-28 MED ORDER — FUROSEMIDE 40 MG PO TABS: 40.0000 mg | ORAL_TABLET | Freq: Every day | ORAL | 0 refills | Status: DC

## 2024-07-28 MED ORDER — SPIRONOLACTONE 25 MG PO TABS: 25.0000 mg | ORAL_TABLET | Freq: Every day | ORAL | 0 refills | Status: DC

## 2024-07-28 MED ORDER — FUROSEMIDE 10 MG/ML IJ SOLN
40.0000 mg | Freq: Once | INTRAMUSCULAR | Status: AC
  Administered 2024-07-28: 40 mg via INTRAVENOUS
  Filled 2024-07-28: qty 4

## 2024-07-28 MED ORDER — EMPAGLIFLOZIN 10 MG PO TABS: 10.0000 mg | ORAL_TABLET | Freq: Every day | ORAL | 0 refills | Status: DC

## 2024-07-28 NOTE — Progress Notes (Signed)
   07/28/24 0038  BiPAP/CPAP/SIPAP  $ Non-Invasive Home Ventilator  Subsequent  BiPAP/CPAP/SIPAP Pt Type Adult  BiPAP/CPAP/SIPAP Resmed  Mask Type Full face mask  Dentures removed? Not applicable  Mask Size Medium  Respiratory Rate 18 breaths/min  IPAP 15 cmH20  EPAP 8 cmH2O  Flow Rate 4 lpm  Patient Home Machine No  Patient Home Mask No  Patient Home Tubing No  Auto Titrate No  Device Plugged into RED Power Outlet Yes

## 2024-07-28 NOTE — Progress Notes (Signed)
 Palliative Medicine Progress Note   Patient Name: Philip [name]       Date: 07/28/2024 DOB: September 10, 1941  Age: 83 y.o. MRN#: [id number] Attending Physician: Fairy Frames, MD Primary Care Physician: [name], MD Admit Date: 07/21/2024   HPI/Patient Profile: 83 y.o. male  with past medical history of A-fib on Eliquis , AAA s/p stenting, CAD s/p CABG in 1999, and CKD stage IIIa who presented to the ED on 07/21/2024 with shortness of breath.  He is admitted with infective mitral valve endocarditis, severe mitral regurgitation, acute on chronic CHF, and acute respiratory failure in the setting of acute cardiogenic pulmonary edema   Palliative Medicine has been consulted for goals of care discussions and complex medical decision making.   Subjective: Chart reviewed. Updates received. Patient is assessed. He is sitting up in the recliner, denies new complaints. States I've felt better but I've felt worse. He is anxious to go home, and hopeful for discharge tomorrow. I offered outpatient palliative follow-up, to which he is agreeable.   I spoke with son/Shane by phone. He has a clear understanding of his father's prognosis and shares that he just wants him to be able to be home for the time he has left. He understands his father will eventually need hospice services to adequately manage his symptoms, avoid recurrent hospitalizations, and allow for a comfortable death when the time comes. Ludie shares that he is familiar with and appreciative of hospice services/philosophy. He agrees with outpatient palliative referral for now, with option to transition to hospice in the future.    Objective:  Physical Exam Vitals reviewed.  Constitutional:      General: He is not in acute distress. Pulmonary:      Effort: Pulmonary effort is normal.  Neurological:     Mental Status: He is alert and oriented to person, place, and time.  Psychiatric:     Comments: pleasant     Palliative Medicine Assessment & Plan   Assessment: Principal Problem:   Acute on chronic diastolic CHF (congestive heart failure) (HCC) Active Problems:   Essential hypertension   PAF (paroxysmal atrial fibrillation) (HCC)   Obesity (BMI 30.0-34.9)   CAD (coronary artery disease)   AAA (abdominal aortic aneurysm)   Chronic kidney disease, stage 3a (HCC)   Acute bacterial endocarditis   Atrial fibrillation with  rapid ventricular response (HCC)   Nonrheumatic mitral valve regurgitation    Recommendations/Plan: Goal of care is medical stabilization and to return home Outpatient palliative referral - family requests [facility] (through [facility])    Primary Decision Maker: PATIENT   Existing Vynca/ACP Documentation: HCPOA document designating Viyaan Champine (wife) and Josiel Gahm (son) as healthcare agents   Code Status/Advance Care Planning: DNR with limited interventions   Prognosis:  guarded   Discharge Planning:  Home with Home Health   Care plan was discussed with Dr. Fairy  Thank you for allowing the Palliative Medicine Team to assist in the care of this patient.   Time: 36 minutes   Recardo KATHEE Loll, NP   Please contact Palliative Medicine Team phone at [phone] for questions and concerns.  For individual providers, please see AMION.

## 2024-07-28 NOTE — Progress Notes (Signed)
 Pt's O2 sat on RA was 87 and went as low as 82 walking down the hall, he was very SOB once O2 was put back on his sats greatly improved on 3 L to 98.  Pt will need O2 at home.  Thanks Garrel FALCON RN.

## 2024-07-28 NOTE — TOC Transition Note (Addendum)
 Transition of Care [facility]) - Discharge Note   Patient Details  Name: Philip [name] MRN: [id number] Date of Birth: 01/05/1941  [facility] CM/SW Contact:  Marval Gell, RN Phone Number: 07/28/2024, 9:57 AM   Clinical Narrative:     Patient ready for DC to home w home IV abx, confirmed w Pam w [employer] that patient can leave at any time to go home meds are delivering around 2-4 and they are a continuous infusion.  [facility] notified of DC   Notified patient will need hoem oxygen. Requested RN to get ambulatory sats, insurance requires use of [employer], notified [employer] of need for O2, they will process order once sat note and DME done and cosigned   Final next level of care: Home w Home Health Services Barriers to Discharge: No Barriers Identified   Patient Goals and CMS Choice Patient states their goals for this hospitalization and ongoing recovery are:: to go home          Discharge Placement                       Discharge Plan and Services Additional resources added to the After Visit Summary for                  DME Arranged: N/A         HH Arranged: RN HH Agency: [facility] Date [facility] Agency Contacted: 07/28/24 Time HH Agency Contacted: [phone] Representative spoke with at [facility] Agency: Darleene  Social Drivers of Health (SDOH) Interventions SDOH Screenings   Food Insecurity: No Food Insecurity (07/21/2024)  Housing: Low Risk  (07/21/2024)  Transportation Needs: No Transportation Needs (07/21/2024)  Utilities: Not At Risk (07/21/2024)  Alcohol Screen: Low Risk  (05/15/2024)  Depression (PHQ2-9): Low Risk  (07/12/2024)  Recent Concern: Depression (PHQ2-9) - Medium Risk (07/05/2024)  Financial Resource Strain: Low Risk  (05/15/2024)  Physical Activity: Sufficiently Active (05/15/2024)  Social Connections: Moderately Integrated (07/21/2024)  Stress: No Stress Concern Present (05/15/2024)  Tobacco Use: Medium Risk (07/25/2024)     Readmission Risk  Interventions    07/23/2024   11:00 AM 06/18/2024    3:45 PM  Readmission Risk Prevention Plan  Transportation Screening Complete Complete  PCP or Specialist Appt within 5-7 Days Complete   Home Care Screening Complete Complete  Medication Review (RN CM) Complete Complete

## 2024-07-28 NOTE — Progress Notes (Signed)
    Durable Medical Equipment  (From admission, onward)           Start     Ordered   07/28/24 0959  For home use only DME oxygen  Once       Question Answer Comment  Length of Need Lifetime   Mode or (Route) Nasal cannula   Liters per Minute 2   Oxygen delivery system Gas      07/28/24 0959           SATURATION QUALIFICATIONS: (This note is used to comply with regulatory documentation for home oxygen)  Patient Saturations on Room Air at Rest = 87%  Patient Saturations on Room Air while Ambulating = 82%  Patient Saturations on 3 Liters of oxygen while Ambulating = 98%  Please briefly explain why patient needs home oxygen: to maintain sats greater than 90%

## 2024-07-28 NOTE — Plan of Care (Signed)
   Problem: Clinical Measurements: Goal: Ability to maintain clinical measurements within normal limits will improve Outcome: Progressing

## 2024-07-28 NOTE — Progress Notes (Signed)
  Progress Note  Patient Name: Philip [name] Date of Encounter: 07/28/2024 [facility] Cardiologist: Dorn Lesches, MD   Interval Summary   Doing well. Ready to go. Home O2.  It is his bride's birthday today.   Vital Signs Vitals:   07/28/24 0038 07/28/24 0045 07/28/24 0542 07/28/24 0727  BP:  132/86 (!) 163/83 (!) 163/81  Pulse: 66  87 77  Resp: 18 18 18 20   Temp:  97.7 F (36.5 C) 97.8 F (36.6 C) 98 F (36.7 C)  TempSrc:  Oral Oral Oral  SpO2: 93%  91% 93%  Weight:   96.7 kg   Height:        Intake/Output Summary (Last 24 hours) at 07/28/2024 0955 Last data filed at 07/28/2024 0700 Gross per 24 hour  Intake 360 ml  Output 1800 ml  Net -1440 ml      07/28/2024    5:42 AM 07/27/2024    4:38 AM 07/26/2024    5:49 AM  Last 3 Weights  Weight (lbs) 213 lb 3 oz 211 lb 10.3 oz 214 lb 8.1 oz  Weight (kg) 96.7 kg 96 kg 97.3 kg      Telemetry/ECG  Currently normal sinus rhythm, no atrial fibrillation as was seen yesterday- Personally Reviewed  Physical Exam  GEN: No acute distress.   Neck: No JVD Cardiac: Normal sinus rhythm 3/6 systolic murmur, no rubs, or gallops. Unchanged Respiratory: Minimal decreased breath sounds left base. GI: Soft, nontender, non-distended  MS: No edema  Assessment & Plan   83 year old male with coronary disease status post CABG x 3 LIMA to LAD SVG to OM SVG to PDA with mitral valve endocarditis with severe eccentric mitral regurgitation paroxysmal atrial fibrillation and chronic diastolic heart failure  Acute on chronic diastolic heart failure - Acute decompensation with volume overload exacerbated by atrial fibrillation with RVR and severe mitral regurgitation as a result of endocarditis - On 07/26/2024 his IV Lasix  was increased to 80 mg twice a day.  Currently on hold after creatinine came back at 1.7 yesterday, now 1.6. -Continue with spironolactone  25 mg a day -Metoprolol  to tartrate currently 50 mg twice a day -On Jardiance   10 mg once a day, SGLT2 inhibitor --consider Lasix  40 mg daily PO as outpatient.  Agree with IV Lasix  prior to discharge.  Paroxysmal atrial fibrillation - Has had intermittent atrial fibrillation while here in the hospital. - Amiodarone  was increased to 400 mg twice a day for 10 days followed by 200 mg twice daily -Metoprolol  was increased to 50 mg twice a day --stable  Mitral valve endocarditis with mitral regurgitation  - Perforation noted with large oscillating vegetation and severe eccentric mitral regurgitation On cefazolin  until 07/31/2024 -Discussed with cardiothoracic surgery team.  Not deemed a surgical candidate unlikely to survive major redo surgery -Continue with palliative care, DNR. -- OK with DC. Agree may need hospice care in future.   For questions or updates, please contact [facility] Please consult [email or web address] for contact info under         Signed, Oneil Parchment, MD

## 2024-07-28 NOTE — Discharge Summary (Signed)
 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   PAF (paroxysmal atrial fibrillation) (HCC)   Essential hypertension   CAD (coronary artery disease)   Acute bacterial endocarditis   Chronic kidney disease, stage 3a (HCC)   AAA (abdominal aortic aneurysm)   Obesity (BMI 30.0-34.9)   Atrial fibrillation with rapid ventricular response (HCC)   Nonrheumatic mitral valve regurgitation   Discharge Condition: Fair  Diet recommendation: Low-sodium, heart healthy  Filed Weights   07/26/24 0549 07/27/24 0438 07/28/24 0542  Weight: 97.3 kg 96 kg 96.7 kg    History of present illness:  83/M w MV endocarditis sp 4 to 5 weeks of antibiotics, hypertension, CKD, abdominal aortic aneurysm, who presented with dyspnea and orthopnea on day of admission. -labs, cr 1.44 , BNP 2,281  CXR w bilateral hilar vascular congestion, bilateral central interstitial infiltrates, predominant at the lower lobes.  - Admitted, started on diuretics, IV amiodarone  - Repeat echo with concern for larger vegetation on mitral valve - Repeat TEE 9/25 with large vegetation and perforation, severe MR - Reviewed by Dr. Shyrl: Poor surgical candidate - 9/25: Palliative care consulted  Hospital Course:   Infective endocarditis of mitral valve, large vegetation Severe mitral regurgitation H/o MSSA mitral valve endocarditis.  TEE 8/25 noted 1.8 x 1.2 cm vegetation on mitral valve, perforation of the mitral valve,  treated with IV cefazolin  to complete therapy on 10/1 -Repeat TEE this admission noted larger vegetation, perforation, severe MR -Appreciate reeval by ID, recommended to DC antibiotics as previously planned on 10/1, he has an ID follow-up next week -Seen by Dr. Kerrin in August, felt to be a poor surgical candidate then, T CTS reconsulted, Dr. Shyrl reviewed case and felt he was a poor surgical candidate - Palliative consulted for goals of care, discussed poor prognosis with patient and son, plan for outpatient palliative care with transition to hospice when he declines further  Acute on chronic diastolic CHF -Echo w EF 60 to 34%, 2.5 x 1.5 cm mobile vegetation attached to the atrial surface of the posterior leaflet of the mitral valve near the base of the leaflet. Positive mitral valve regurgitation.  - Improving with diuresis,  10L negative, switch to oral diuretics today - continue Jardiance  and Aldactone  - Improving, anxious to go home as that is his wife's birthday, outpatient palliative care set up, see discussion above   Acute hypoxemic respiratory failure due to acute cardiogenic pulmonary edema.  -Improving as above, home O2 set up at discharge    Paroxysmal atrial fibrillation (HCC) Atrial flutter with variable block.  -Now in sinus rhythm, continue metoprolol , Eliquis , po amiodarone , cards following   Essential hypertension -Stable, meds as above   CAD, CABG No chest pain, elevation high sensitive troponin due to tachycardia and heart failure -Stable, continue statin, DOAC   Chronic kidney disease, stage 3a (HCC) Hypokalemia  -Mild uptrend but relatively stable   AAA (abdominal aortic aneurysm) Follow up as outpatient    Obesity (BMI 30.0-34.9) Calculated BMI is 33.2 consistent in obesity class 1  Discharge Exam: Vitals:  07/28/24 0542 07/28/24 0727  BP: (!) 163/83 (!) 163/81  Pulse: 87 77  Resp: 18 20  Temp: 97.8 F (36.6 C) 98 F (36.7 C)  SpO2:  91% 93%    Gen: Awake, Alert, Oriented X 3,  HEENT:+ JVD Lungs: Few basilar rales CVS: S1S2/RRR systolic murmur Abd: soft, Non tender, non distended, BS present Extremities: Trace edema Skin: no new rashes on exposed skin   Discharge Instructions   Discharge Instructions     Diet - low sodium heart healthy   Complete by: As directed    Increase activity slowly   Complete by: As directed       Allergies as of 07/28/2024   No Known Allergies      Medication List     STOP taking these medications    amLODipine  10 MG tablet Commonly known as: NORVASC        TAKE these medications    amiodarone  200 MG tablet Commonly known as: PACERONE  Take 2 tablets (400 mg total) by mouth 2 (two) times daily for 7 days, THEN 1 tablet (200 mg total) 2 (two) times daily. Start taking on: July 28, 2024   atorvastatin  40 MG tablet Commonly known as: LIPITOR TAKE 1 TABLET EVERY DAY (NEED APPOINTMENT FOR REFILLS) What changed: See the new instructions.   B-COMPLEX PO Take 1 tablet by mouth daily.   ceFAZolin  IVPB Commonly known as: ANCEF  Inject 6 g into the vein daily for 5 days. Indication:  MSSA endocarditis First Dose: Yes Last Day of Therapy:  10/1 Labs - Once weekly:  CBC/D and BMP, Labs - Once weekly: ESR and CRP Method of administration: continuous infusion - 6g daily as a continuous infusion thru 10/1 Method of administration may be changed at the discretion of home infusion pharmacist based upon assessment of the patient and/or caregiver's ability to self-administer the medication ordered. What changed:  how much to take additional instructions   co-enzyme Q-10 30 MG capsule Take 30 mg by mouth daily.   Eliquis  5 MG Tabs tablet Generic drug: apixaban  TAKE 1 TABLET TWICE DAILY   empagliflozin  10 MG Tabs tablet Commonly known as: JARDIANCE  Take 1 tablet (10 mg total) by mouth daily.   furosemide  40 MG tablet Commonly known as: Lasix  Take 1 tablet (40 mg  total) by mouth daily.   magnesium  oxide 400 MG tablet Commonly known as: MAG-OX Take 800 mg by mouth daily.   metoprolol  tartrate 50 MG tablet Commonly known as: LOPRESSOR  Take 1 tablet (50 mg total) by mouth 2 (two) times daily.   multivitamin with minerals tablet Take 1 tablet by mouth daily.   Omega-3 Fish Oil  1200 MG Caps Take 2,400 mg by mouth daily.   polyethylene glycol 17 g packet Commonly known as: MIRALAX  / GLYCOLAX  Take 17 g by mouth daily.   spironolactone  25 MG tablet Commonly known as: ALDACTONE  Take 1 tablet (25 mg total) by mouth daily.   VITAMIN C ER PO Take 500 mg by mouth daily.               Durable Medical Equipment  (From admission, onward)           Start     Ordered   07/28/24 0959  For home use only DME oxygen  Once       Question Answer Comment  Length of Need Lifetime   Mode or (Route) Nasal cannula   Liters per Minute 2   Oxygen delivery system Gas  07/28/24 0959           No Known Allergies  Follow-up Information     Frann Mabel Mt, DO Follow up on 07/29/2024.   Specialty: Family Medicine Why: 11 am for hospital follow up Contact information: [address] [place] [address] [phone]         [facility] Follow up.   Specialty: Home Health Services Why: Agency will contact you to set up apt times Contact information: [address] [place] [address] [phone]         [facility] Follow up.   Why: supplies IV medication Contact information: [phone]        [facility] Follow up.   Why: for home oxygen Contact information: [address] [place] [address] [phone]                  The results of significant diagnostics from this hospitalization (including imaging, microbiology, ancillary and laboratory) are listed below for reference.    Significant Diagnostic Studies: ECHO  TEE Result Date: 07/25/2024    TRANSESOPHOGEAL ECHO REPORT   Patient Name:   Philip [name] Date of Exam: 07/25/2024 Medical Rec #:  [id number]      Height:       69.0 in Accession #:    [id number]     Weight:       215.3 lb Date of Birth:  1941-04-20      BSA:          2.132 m Patient Age:    83 years       BP:           136/89 mmHg Patient Gender: M              HR:           103 bpm. Exam Location:  Inpatient Procedure: Transesophageal Echo, Cardiac Doppler and Color Doppler (Both            Spectral and Color Flow Doppler were utilized during procedure). Indications:     Endocarditis  History:         Patient has prior history of Echocardiogram examinations, most                  recent 07/22/2024. CHF, CAD and Previous Myocardial Infarction;                  Risk Factors:Hypertension and Sleep Apnea.  Sonographer:     Jayson Gaskins Referring Phys:  [id number] MANUELITA B ROBERTS Diagnosing Phys: [name] MD PROCEDURE: After discussion of the risks and benefits of a TEE, an informed consent was obtained from the patient. The transesophogeal probe was passed without difficulty through the esophogus of the patient. Sedation performed by different physician. The patient was monitored while under deep sedation. Anesthestetic sedation was provided intravenously by Anesthesiology: 200mg  of Propofol . Image quality was excellent. The patient developed no complications during the procedure.  IMPRESSIONS  1. Large vegetations noted on MV with probable leaflet perforation; severe eccentric MR.  2. Left ventricular ejection fraction, by estimation, is 55 to 60%. The left ventricle has normal function. The left ventricle has no regional wall motion abnormalities.  3. Right ventricular systolic function is normal. The right ventricular size is normal.  4. Left atrial size was severely dilated. No left atrial/left atrial appendage thrombus was detected.  5. The mitral valve is  abnormal. Severe mitral valve regurgitation.  6. The  aortic valve is tricuspid. Aortic valve regurgitation is not visualized. Aortic valve sclerosis/calcification is present, without any evidence of aortic stenosis.  7. There is Moderate (Grade III) plaque involving the descending aorta.  8. 3D performed of the mitral valve. Conclusion(s)/Recommendation(s): Findings are concerning for vegetation/infective endocarditis as detailed above. Findings concerning for mitral valve vegetation. FINDINGS  Left Ventricle: Left ventricular ejection fraction, by estimation, is 55 to 60%. The left ventricle has normal function. The left ventricle has no regional wall motion abnormalities. The left ventricular internal cavity size was normal in size. There is  no left ventricular hypertrophy. Right Ventricle: The right ventricular size is normal. Right ventricular systolic function is normal. Left Atrium: Left atrial size was severely dilated. No left atrial/left atrial appendage thrombus was detected. Right Atrium: Right atrial size was normal in size. Pericardium: There is no evidence of pericardial effusion. Mitral Valve: The mitral valve is abnormal. Severe mitral valve regurgitation. Tricuspid Valve: The tricuspid valve is normal in structure. Tricuspid valve regurgitation is mild. Aortic Valve: The aortic valve is tricuspid. Aortic valve regurgitation is not visualized. Aortic valve sclerosis/calcification is present, without any evidence of aortic stenosis. Pulmonic Valve: The pulmonic valve was normal in structure. Pulmonic valve regurgitation is trivial. Aorta: The aortic root is normal in size and structure. There is moderate (Grade III) plaque involving the descending aorta. IAS/Shunts: No atrial level shunt detected by color flow Doppler. Additional Comments: Large vegetations noted on MV with probable leaflet perforation; severe eccentric MR. 3D was performed not requiring image post processing on an independent workstation and was abnormal. [name] MD  Electronically signed by [name] MD Signature Date/Time: 07/25/2024/10:20:46 AM    Final    EP STUDY Result Date: 07/25/2024 See surgical note for result.  ECHOCARDIOGRAM LIMITED Result Date: 07/22/2024    ECHOCARDIOGRAM LIMITED REPORT   Patient Name:   Philip [name] Date of Exam: 07/22/2024 Medical Rec #:  [id number]      Height:       69.0 in Accession #:    [id number]     Weight:       224.9 lb Date of Birth:  September 08, 1941      BSA:          2.172 m Patient Age:    83 years       BP:           139/93 mmHg Patient Gender: M              HR:           144 bpm. Exam Location:  Inpatient Procedure: Color Doppler and Limited Echo (Both Spectral and Color Flow Doppler            were utilized during procedure). Indications:    Mitral Valve Disorder  History:        Patient has prior history of Echocardiogram examinations, most                 recent 06/24/2024.  Sonographer:    Tinnie Gosling RDCS Referring Phys: [id number] MAURICIO DANIEL ARRIEN IMPRESSIONS  1. Left ventricular ejection fraction, by estimation, is 60 to 65%. The left ventricle has normal function. Left ventricular endocardial border not optimally defined to evaluate regional wall motion.  2. Left atrial size was mildly dilated.  3. 2.5 x 1.5 cm mobile vegetation attached to the atrial surface of the posterior leaflet of the mitral valve near  the base of the leaflet. It prolapses across the leaflet. Mitral regurgitation is present but is not fully interrogated. Moderate mitral annular calcification.  4. There is mild calcification of the aortic valve.  5. The inferior vena cava is normal in size with greater than 50% respiratory variability, suggesting right atrial pressure of 3 mmHg.  6. Very limited echo. There is still a large mitral valve vegetation. The mitral regurgitation was not fully interrogated. The RV was not visualized. The aortic valve was not visualized. Would consider full echo or repeat TEE. FINDINGS  Left Ventricle: Left  ventricular ejection fraction, by estimation, is 60 to 65%. The left ventricle has normal function. Left ventricular endocardial border not optimally defined to evaluate regional wall motion. The left ventricular internal cavity size was normal in size. There is no left ventricular hypertrophy. Left Atrium: Left atrial size was mildly dilated. Mitral Valve: 2.5 x 1.5 cm mobile vegetation attached to the atrial surface of the posterior leaflet of the mitral valve near the base of the leaflet. It prolapses across the leaflet. Mitral regurgitation is present but is not fully interrogated. Moderate mitral annular calcification. Aortic Valve: There is mild calcification of the aortic valve. Venous: The inferior vena cava is normal in size with greater than 50% respiratory variability, suggesting right atrial pressure of 3 mmHg. IVC IVC diam: 1.40 cm Dalton McleanMD Electronically signed by Ezra Kanner Signature Date/Time: 07/22/2024/9:28:59 PM    Final    DG Chest Portable 1 View Result Date: 07/21/2024 CLINICAL DATA:  Shortness of breath. EXAM: PORTABLE CHEST 1 VIEW COMPARISON:  06/19/2024 FINDINGS: Cardiopericardial silhouette is at upper limits of normal for size. Diffuse interstitial and basilar airspace disease suggests edema. No substantial pleural effusion. Right PICC line tip overlies the right innominate vein. Telemetry leads overlie the chest. IMPRESSION: Diffuse interstitial and basilar airspace disease suggests edema. Electronically Signed   By: Camellia Candle M.D.   On: 07/21/2024 11:49   US  EKG SITE RITE Result Date: 07/14/2024 If Site Rite image not attached, placement could not be confirmed due to current cardiac rhythm.   Microbiology: Recent Results (from the past 240 hours)  Resp panel by RT-PCR (RSV, Flu A&B, Covid) Anterior Nasal Swab     Status: None   Collection Time: 07/21/24 11:06 AM   Specimen: Anterior Nasal Swab  Result Value Ref Range Status   SARS Coronavirus 2 by RT PCR  NEGATIVE NEGATIVE Final    Comment: (NOTE) SARS-CoV-2 target nucleic acids are NOT DETECTED.  The SARS-CoV-2 RNA is generally detectable in upper respiratory specimens during the acute phase of infection. The lowest concentration of SARS-CoV-2 viral copies this assay can detect is 138 copies/mL. A negative result does not preclude SARS-Cov-2 infection and should not be used as the sole basis for treatment or other patient management decisions. A negative result may occur with  improper specimen collection/handling, submission of specimen other than nasopharyngeal swab, presence of viral mutation(s) within the areas targeted by this assay, and inadequate number of viral copies(<138 copies/mL). A negative result must be combined with clinical observations, patient history, and epidemiological information. The expected result is Negative.  Fact Sheet for Patients:  BloggerCourse.com  Fact Sheet for Healthcare Providers:  SeriousBroker.it  This test is no t yet approved or cleared by the [place]  FDA and  has been authorized for detection and/or diagnosis of SARS-CoV-2 by FDA under an Emergency Use Authorization (EUA). This EUA will remain  in effect (meaning this test can be used)  for the duration of the COVID-19 declaration under Section 564(b)(1) of the Act, 21 U.S.C.section 360bbb-3(b)(1), unless the authorization is terminated  or revoked sooner.       Influenza A by PCR NEGATIVE NEGATIVE Final   Influenza B by PCR NEGATIVE NEGATIVE Final    Comment: (NOTE) The Xpert Xpress SARS-CoV-2/FLU/RSV plus assay is intended as an aid in the diagnosis of influenza from Nasopharyngeal swab specimens and should not be used as a sole basis for treatment. Nasal washings and aspirates are unacceptable for Xpert Xpress SARS-CoV-2/FLU/RSV testing.  Fact Sheet for Patients: BloggerCourse.com  Fact Sheet for  Healthcare Providers: SeriousBroker.it  This test is not yet approved or cleared by the [place]  FDA and has been authorized for detection and/or diagnosis of SARS-CoV-2 by FDA under an Emergency Use Authorization (EUA). This EUA will remain in effect (meaning this test can be used) for the duration of the COVID-19 declaration under Section 564(b)(1) of the Act, 21 U.S.C. section 360bbb-3(b)(1), unless the authorization is terminated or revoked.     Resp Syncytial Virus by PCR NEGATIVE NEGATIVE Final    Comment: (NOTE) Fact Sheet for Patients: BloggerCourse.com  Fact Sheet for Healthcare Providers: SeriousBroker.it  This test is not yet approved or cleared by the [place]  FDA and has been authorized for detection and/or diagnosis of SARS-CoV-2 by FDA under an Emergency Use Authorization (EUA). This EUA will remain in effect (meaning this test can be used) for the duration of the COVID-19 declaration under Section 564(b)(1) of the Act, 21 U.S.C. section 360bbb-3(b)(1), unless the authorization is terminated or revoked.  Performed at [facility], [address] [place], [place] [address]      Labs: Basic Metabolic Panel: Recent Labs  Lab 07/22/24 0309 07/23/24 0224 07/24/24 0524 07/24/24 1807 07/25/24 0152 07/25/24 0545 07/26/24 0614 07/27/24 0525 07/28/24 0435  NA 139 138 135 135 134*  --  136 135 135  K 4.0 3.0* 2.8* 3.3* 3.7  --  3.8 4.1 4.4  CL 103 98 95* 95* 95*  --  97* 96* 101  CO2 23 25 27 26 25   --  26 26 25   GLUCOSE 119* 141* 111* 161* 119*  --  112* 98 110*  BUN 20 21 29* 30* 29*  --  32* 38* 38*  CREATININE 1.33* 1.45* 1.59* 1.54* 1.45*  --  1.54* 1.69* 1.59*  CALCIUM  9.0 8.9 8.7* 8.7* 8.8*  --  8.8* 8.8* 8.8*  MG 2.1 1.9 2.1  --   --  2.2  --   --   --   PHOS 3.7  --   --   --   --   --   --   --   --    Liver Function Tests: Recent Labs  Lab  07/22/24 0309 07/23/24 0224  AST 26 32  ALT 6 <5  ALKPHOS 63 57  BILITOT 1.3* 1.3*  PROT 6.3* 6.6  ALBUMIN 3.7 3.6   No results for input(s): LIPASE, AMYLASE in the last 168 hours. No results for input(s): AMMONIA in the last 168 hours. CBC: Recent Labs  Lab 07/21/24 1106 07/22/24 0309 07/23/24 0224 07/25/24 0152  WBC 7.5 9.0 9.7 7.7  NEUTROABS 5.3 6.2  --   --   HGB 12.0* 13.5 13.9 12.5*  HCT 34.9* 40.5 40.3 36.3*  MCV 94.8 95.1 92.9 93.1  PLT 76* 79* 81* 81*   Cardiac Enzymes: No results for input(s): CKTOTAL, CKMB, CKMBINDEX, TROPONINI in the last  168 hours. BNP: BNP (last 3 results) No results for input(s): BNP in the last 8760 hours.  ProBNP (last 3 results) Recent Labs    07/21/24 1106  PROBNP 2,281.0*    CBG: No results for input(s): GLUCAP in the last 168 hours.     Signed:  [name] Pac MD.  [facility] 07/28/2024, 10:38 AM

## 2024-07-28 NOTE — Progress Notes (Signed)
 Mobility Specialist Progress Note:    07/28/24 1018  Mobility  Activity Ambulated with assistance  Level of Assistance Standby assist, set-up cues, supervision of patient - no hands on  Assistive Device None  Distance Ambulated (ft) 500 ft  Activity Response Tolerated fair  Mobility Referral Yes  Mobility visit 1 Mobility  Mobility Specialist Start Time (ACUTE ONLY) 1018  Mobility Specialist Stop Time (ACUTE ONLY) 1032  Mobility Specialist Time Calculation (min) (ACUTE ONLY) 14 min   Pt pleasant and agreeable to session w/ family in room. C/o SOB. Kept pt on 3L during ambulation which he did well on. Returned pt to room w/all needs met.   Venetia Keel Mobility Specialist Please Neurosurgeon or Rehab Office at [phone]

## 2024-07-29 ENCOUNTER — Telehealth: Payer: Self-pay

## 2024-07-29 ENCOUNTER — Inpatient Hospital Stay: Admitting: Family Medicine

## 2024-07-29 DIAGNOSIS — C44209 Unspecified malignant neoplasm of skin of left ear and external auricular canal: Secondary | ICD-10-CM | POA: Diagnosis not present

## 2024-07-29 DIAGNOSIS — T8131XD Disruption of external operation (surgical) wound, not elsewhere classified, subsequent encounter: Secondary | ICD-10-CM | POA: Diagnosis not present

## 2024-07-29 DIAGNOSIS — I509 Heart failure, unspecified: Secondary | ICD-10-CM | POA: Diagnosis not present

## 2024-07-29 DIAGNOSIS — I330 Acute and subacute infective endocarditis: Secondary | ICD-10-CM | POA: Diagnosis not present

## 2024-07-29 DIAGNOSIS — I76 Septic arterial embolism: Secondary | ICD-10-CM | POA: Diagnosis not present

## 2024-07-29 DIAGNOSIS — I130 Hypertensive heart and chronic kidney disease with heart failure and stage 1 through stage 4 chronic kidney disease, or unspecified chronic kidney disease: Secondary | ICD-10-CM | POA: Diagnosis not present

## 2024-07-29 DIAGNOSIS — G9341 Metabolic encephalopathy: Secondary | ICD-10-CM | POA: Diagnosis not present

## 2024-07-29 DIAGNOSIS — R7881 Bacteremia: Secondary | ICD-10-CM | POA: Diagnosis not present

## 2024-07-29 DIAGNOSIS — T8149XA Infection following a procedure, other surgical site, initial encounter: Secondary | ICD-10-CM | POA: Diagnosis not present

## 2024-07-29 DIAGNOSIS — A4101 Sepsis due to Methicillin susceptible Staphylococcus aureus: Secondary | ICD-10-CM | POA: Diagnosis not present

## 2024-07-29 NOTE — Transitions of Care (Post Inpatient/ED Visit) (Signed)
 07/29/2024  Name: Philip [name] MRN: [id number] DOB: 02/21/1941  Today's TOC FU Call Status: Today's TOC FU Call Status:: Successful TOC FU Call Completed TOC FU Call Complete Date: 07/29/24 Patient's Name and Date of Birth confirmed.  Transition Care Management Follow-up Telephone Call Date of Discharge: 07/28/24 Discharge Facility: [facility] [place]) Type of Discharge: Inpatient Admission Primary Inpatient Discharge Diagnosis:: CHF How have you been since you were released from the hospital?: Better Any questions or concerns?: No  Items Reviewed: Did you receive and understand the discharge instructions provided?: Yes Medications obtained,verified, and reconciled?: Yes (Medications Reviewed) Any new allergies since your discharge?: No Dietary orders reviewed?: Yes Type of Diet Ordered:: Low Sodium Heart Healthy Do you have support at home?: Yes People in Home [RPT]: spouse Name of Support/Comfort Primary Source: Dickey Bruch  Medications Reviewed Today: Medications Reviewed Today     Reviewed by Moises Reusing, RN (Case Manager) on 07/29/24 at 1446  Med List Status: <None>   Medication Order Taking? Sig Documenting Provider Last Dose Status Informant  amiodarone  (PACERONE ) 200 MG tablet [id number]  Take 2 tablets (400 mg total) by mouth 2 (two) times daily for 7 days, THEN 1 tablet (200 mg total) 2 (two) times daily. Fairy Frames, MD  Active   Ascorbic Acid (VITAMIN C ER PO) [id number]  Take 500 mg by mouth daily. [provider]  Active Self  atorvastatin  (LIPITOR) 40 MG tablet [id number]  TAKE 1 TABLET EVERY DAY (NEED APPOINTMENT FOR REFILLS) Court Dorn PARAS, MD  Active   B Complex-Biotin-FA (B-COMPLEX PO) [id number]  Take 1 tablet by mouth daily. [provider]  Active Self  ceFAZolin  (ANCEF ) IVPB [id number]  Inject 6 g into the vein daily for 5 days. Indication:  MSSA endocarditis First Dose: Yes Last Day of Therapy:  10/1 Labs - Once weekly:  CBC/D  and BMP, Labs - Once weekly: ESR and CRP Method of administration: continuous infusion - 6g daily as a continuous infusion thru 10/1 Method of administration may be changed at the discretion of home infusion pharmacist based upon assessment of the patient and/or caregiver's ability to self-administer the medication ordered. Overton Faith T, MD  Active   co-enzyme Q-10 30 MG capsule [id number]  Take 30 mg by mouth daily. [provider]  Active Self  ELIQUIS  5 MG TABS tablet [id number]  TAKE 1 TABLET TWICE DAILY Wyn Ernest H Jr., NP  Active Self  empagliflozin  (JARDIANCE ) 10 MG TABS tablet [id number]  Take 1 tablet (10 mg total) by mouth daily. Fairy Frames, MD  Active   furosemide  (LASIX ) 40 MG tablet [id number]  Take 1 tablet (40 mg total) by mouth daily. Fairy Frames, MD  Active   magnesium  oxide (MAG-OX) 400 MG tablet [id number]  Take 800 mg by mouth daily. [provider]  Active Self  metoprolol  tartrate (LOPRESSOR ) 50 MG tablet [id number]  Take 1 tablet (50 mg total) by mouth 2 (two) times daily. Drusilla Sabas RAMAN, MD  Active Self  Multiple Vitamins-Minerals (MULTIVITAMIN WITH MINERALS) tablet [id number]  Take 1 tablet by mouth daily. [provider]  Active Self  Omega-3 Fatty Acids (OMEGA-3 FISH OIL ) 1200 MG CAPS [id number]  Take 2,400 mg by mouth daily. Carla Milling, RPH-CPP  Active Self  OXYGEN [id number] Yes Inhale 2.5 L/min into the lungs continuous. [provider]  Active   polyethylene glycol (MIRALAX  / GLYCOLAX ) packet [id number]  Take 17 g by mouth daily. [provider]  Active Self  spironolactone  (ALDACTONE ) 25  MG tablet [id number]  Take 1 tablet (25 mg total) by mouth daily. Fairy Frames, MD  Active             Home Care and Equipment/Supplies: Were Home Health Services Ordered?: Yes Name of Home Health Agency:: [facility] Has Agency set up a time to come to your home?: Yes First Home Health Visit Date: 07/29/24 Any new equipment or  medical supplies ordered?: Yes Name of Medical supply agency?: [facility] Were you able to get the equipment/medical supplies?: Yes Do you have any questions related to the use of the equipment/supplies?: No  Functional Questionnaire: Do you need assistance with bathing/showering or dressing?: No Do you need assistance with meal preparation?: No Do you need assistance with eating?: No Do you have difficulty maintaining continence: No Do you need assistance with getting out of bed/getting out of a chair/moving?: No Do you have difficulty managing or taking your medications?: No  Follow up appointments reviewed: PCP Follow-up appointment confirmed?: Yes Date of PCP follow-up appointment?: 08/06/24 Follow-up Provider: [facility] Follow-up appointment confirmed?: Yes Date of Specialist follow-up appointment?: 07/02/24 Follow-Up Specialty Provider:: Dr. Overton Do you need transportation to your follow-up appointment?: No Do you understand care options if your condition(s) worsen?: Yes-patient verbalized understanding  SDOH Interventions Today    Flowsheet Row Most Recent Value  SDOH Interventions   Food Insecurity Interventions Intervention Not Indicated  Housing Interventions Intervention Not Indicated  Transportation Interventions Intervention Not Indicated  Utilities Interventions Intervention Not Indicated    Medford Balboa, BSN, RN [place]  [facility] - [facility] RN Care Manager [phone]

## 2024-07-30 ENCOUNTER — Other Ambulatory Visit: Payer: Self-pay

## 2024-07-30 ENCOUNTER — Inpatient Hospital Stay (HOSPITAL_COMMUNITY)
Admission: EM | Admit: 2024-07-30 | Discharge: 2024-08-04 | DRG: 288 | Disposition: A | Discharge status: Hospice | Attending: Internal Medicine | Admitting: Internal Medicine

## 2024-07-30 ENCOUNTER — Emergency Department (HOSPITAL_COMMUNITY)

## 2024-07-30 DIAGNOSIS — Z7984 Long term (current) use of oral hypoglycemic drugs: Secondary | ICD-10-CM

## 2024-07-30 DIAGNOSIS — Z8249 Family history of ischemic heart disease and other diseases of the circulatory system: Secondary | ICD-10-CM | POA: Diagnosis not present

## 2024-07-30 DIAGNOSIS — I2510 Atherosclerotic heart disease of native coronary artery without angina pectoris: Secondary | ICD-10-CM | POA: Diagnosis not present

## 2024-07-30 DIAGNOSIS — Z808 Family history of malignant neoplasm of other organs or systems: Secondary | ICD-10-CM

## 2024-07-30 DIAGNOSIS — Z515 Encounter for palliative care: Secondary | ICD-10-CM

## 2024-07-30 DIAGNOSIS — R0603 Acute respiratory distress: Secondary | ICD-10-CM | POA: Diagnosis not present

## 2024-07-30 DIAGNOSIS — I440 Atrioventricular block, first degree: Secondary | ICD-10-CM | POA: Diagnosis present

## 2024-07-30 DIAGNOSIS — N179 Acute kidney failure, unspecified: Secondary | ICD-10-CM | POA: Diagnosis not present

## 2024-07-30 DIAGNOSIS — I10 Essential (primary) hypertension: Secondary | ICD-10-CM | POA: Diagnosis not present

## 2024-07-30 DIAGNOSIS — I480 Paroxysmal atrial fibrillation: Secondary | ICD-10-CM | POA: Diagnosis not present

## 2024-07-30 DIAGNOSIS — B9561 Methicillin susceptible Staphylococcus aureus infection as the cause of diseases classified elsewhere: Secondary | ICD-10-CM | POA: Diagnosis present

## 2024-07-30 DIAGNOSIS — R062 Wheezing: Secondary | ICD-10-CM | POA: Diagnosis not present

## 2024-07-30 DIAGNOSIS — Z951 Presence of aortocoronary bypass graft: Secondary | ICD-10-CM

## 2024-07-30 DIAGNOSIS — J811 Chronic pulmonary edema: Secondary | ICD-10-CM | POA: Diagnosis not present

## 2024-07-30 DIAGNOSIS — I5033 Acute on chronic diastolic (congestive) heart failure: Principal | ICD-10-CM | POA: Diagnosis present

## 2024-07-30 DIAGNOSIS — J810 Acute pulmonary edema: Secondary | ICD-10-CM | POA: Diagnosis not present

## 2024-07-30 DIAGNOSIS — I739 Peripheral vascular disease, unspecified: Secondary | ICD-10-CM | POA: Diagnosis present

## 2024-07-30 DIAGNOSIS — G4733 Obstructive sleep apnea (adult) (pediatric): Secondary | ICD-10-CM | POA: Diagnosis present

## 2024-07-30 DIAGNOSIS — E66811 Obesity, class 1: Secondary | ICD-10-CM | POA: Diagnosis present

## 2024-07-30 DIAGNOSIS — Z8042 Family history of malignant neoplasm of prostate: Secondary | ICD-10-CM

## 2024-07-30 DIAGNOSIS — Z85828 Personal history of other malignant neoplasm of skin: Secondary | ICD-10-CM

## 2024-07-30 DIAGNOSIS — I340 Nonrheumatic mitral (valve) insufficiency: Secondary | ICD-10-CM | POA: Insufficient documentation

## 2024-07-30 DIAGNOSIS — I130 Hypertensive heart and chronic kidney disease with heart failure and stage 1 through stage 4 chronic kidney disease, or unspecified chronic kidney disease: Secondary | ICD-10-CM | POA: Diagnosis present

## 2024-07-30 DIAGNOSIS — R0602 Shortness of breath: Secondary | ICD-10-CM | POA: Diagnosis not present

## 2024-07-30 DIAGNOSIS — Z8619 Personal history of other infectious and parasitic diseases: Secondary | ICD-10-CM

## 2024-07-30 DIAGNOSIS — Z7901 Long term (current) use of anticoagulants: Secondary | ICD-10-CM

## 2024-07-30 DIAGNOSIS — E785 Hyperlipidemia, unspecified: Secondary | ICD-10-CM | POA: Diagnosis present

## 2024-07-30 DIAGNOSIS — N189 Chronic kidney disease, unspecified: Secondary | ICD-10-CM | POA: Diagnosis not present

## 2024-07-30 DIAGNOSIS — K219 Gastro-esophageal reflux disease without esophagitis: Secondary | ICD-10-CM | POA: Diagnosis present

## 2024-07-30 DIAGNOSIS — Z66 Do not resuscitate: Secondary | ICD-10-CM | POA: Diagnosis present

## 2024-07-30 DIAGNOSIS — Z800 Family history of malignant neoplasm of digestive organs: Secondary | ICD-10-CM

## 2024-07-30 DIAGNOSIS — Z79899 Other long term (current) drug therapy: Secondary | ICD-10-CM | POA: Diagnosis not present

## 2024-07-30 DIAGNOSIS — Z87891 Personal history of nicotine dependence: Secondary | ICD-10-CM

## 2024-07-30 DIAGNOSIS — Z85820 Personal history of malignant melanoma of skin: Secondary | ICD-10-CM | POA: Diagnosis not present

## 2024-07-30 DIAGNOSIS — N1832 Chronic kidney disease, stage 3b: Secondary | ICD-10-CM | POA: Diagnosis present

## 2024-07-30 DIAGNOSIS — Z83438 Family history of other disorder of lipoprotein metabolism and other lipidemia: Secondary | ICD-10-CM

## 2024-07-30 DIAGNOSIS — J189 Pneumonia, unspecified organism: Secondary | ICD-10-CM | POA: Diagnosis not present

## 2024-07-30 DIAGNOSIS — I330 Acute and subacute infective endocarditis: Secondary | ICD-10-CM | POA: Diagnosis not present

## 2024-07-30 DIAGNOSIS — J9601 Acute respiratory failure with hypoxia: Secondary | ICD-10-CM | POA: Diagnosis not present

## 2024-07-30 DIAGNOSIS — I252 Old myocardial infarction: Secondary | ICD-10-CM

## 2024-07-30 DIAGNOSIS — Z9981 Dependence on supplemental oxygen: Secondary | ICD-10-CM

## 2024-07-30 DIAGNOSIS — Z6833 Body mass index (BMI) 33.0-33.9, adult: Secondary | ICD-10-CM

## 2024-07-30 DIAGNOSIS — J90 Pleural effusion, not elsewhere classified: Secondary | ICD-10-CM | POA: Diagnosis not present

## 2024-07-30 DIAGNOSIS — I4891 Unspecified atrial fibrillation: Secondary | ICD-10-CM | POA: Diagnosis not present

## 2024-07-30 LAB — CBC WITH DIFFERENTIAL/PLATELET
Abs Immature Granulocytes: 0.05 K/uL (ref 0.00–0.07)
Basophils Absolute: 0.1 K/uL (ref 0.0–0.1)
Basophils Relative: 1 %
Eosinophils Absolute: 0.2 K/uL (ref 0.0–0.5)
Eosinophils Relative: 2 %
HCT: 40.7 % (ref 39.0–52.0)
Hemoglobin: 13.5 g/dL (ref 13.0–17.0)
Immature Granulocytes: 1 %
Lymphocytes Relative: 50 %
Lymphs Abs: 5.2 K/uL — ABNORMAL HIGH (ref 0.7–4.0)
MCH: 32.2 pg (ref 26.0–34.0)
MCHC: 33.2 g/dL (ref 30.0–36.0)
MCV: 97.1 fL (ref 80.0–100.0)
Monocytes Absolute: 0.9 K/uL (ref 0.1–1.0)
Monocytes Relative: 9 %
Neutro Abs: 3.8 K/uL (ref 1.7–7.7)
Neutrophils Relative %: 37 %
Platelets: 162 K/uL (ref 150–400)
RBC: 4.19 MIL/uL — ABNORMAL LOW (ref 4.22–5.81)
RDW: 13 % (ref 11.5–15.5)
WBC: 10.3 K/uL (ref 4.0–10.5)
nRBC: 0 % (ref 0.0–0.2)

## 2024-07-30 LAB — COMPREHENSIVE METABOLIC PANEL WITH GFR
ALT: 5 U/L (ref 0–44)
AST: 49 U/L — ABNORMAL HIGH (ref 15–41)
Albumin: 3.8 g/dL (ref 3.5–5.0)
Alkaline Phosphatase: 51 U/L (ref 38–126)
Anion gap: 16 — ABNORMAL HIGH (ref 5–15)
BUN: 44 mg/dL — ABNORMAL HIGH (ref 8–23)
CO2: 22 mmol/L (ref 22–32)
Calcium: 8.9 mg/dL (ref 8.9–10.3)
Chloride: 95 mmol/L — ABNORMAL LOW (ref 98–111)
Creatinine, Ser: 2.39 mg/dL — ABNORMAL HIGH (ref 0.61–1.24)
GFR, Estimated: 26 mL/min — ABNORMAL LOW (ref >60)
Glucose, Bld: 252 mg/dL — ABNORMAL HIGH (ref 70–99)
Potassium: 3.8 mmol/L (ref 3.5–5.1)
Sodium: 133 mmol/L — ABNORMAL LOW (ref 135–145)
Total Bilirubin: 0.9 mg/dL (ref 0.0–1.2)
Total Protein: 6.5 g/dL (ref 6.5–8.1)

## 2024-07-30 LAB — I-STAT CHEM 8, ED
BUN: 43 mg/dL — ABNORMAL HIGH (ref 8–23)
Calcium, Ion: 1.13 mmol/L — ABNORMAL LOW (ref 1.15–1.40)
Chloride: 97 mmol/L — ABNORMAL LOW (ref 98–111)
Creatinine, Ser: 2.4 mg/dL — ABNORMAL HIGH (ref 0.61–1.24)
Glucose, Bld: 260 mg/dL — ABNORMAL HIGH (ref 70–99)
HCT: 39 % (ref 39.0–52.0)
Hemoglobin: 13.3 g/dL (ref 13.0–17.0)
Potassium: 3.7 mmol/L (ref 3.5–5.1)
Sodium: 133 mmol/L — ABNORMAL LOW (ref 135–145)
TCO2: 25 mmol/L (ref 22–32)

## 2024-07-30 LAB — I-STAT VENOUS BLOOD GAS, ED
Acid-base deficit: 4 mmol/L — ABNORMAL HIGH (ref 0.0–2.0)
Bicarbonate: 24.3 mmol/L (ref 20.0–28.0)
Calcium, Ion: 1.14 mmol/L — ABNORMAL LOW (ref 1.15–1.40)
HCT: 40 % (ref 39.0–52.0)
Hemoglobin: 13.6 g/dL (ref 13.0–17.0)
O2 Saturation: 76 %
Potassium: 3.7 mmol/L (ref 3.5–5.1)
Sodium: 133 mmol/L — ABNORMAL LOW (ref 135–145)
TCO2: 26 mmol/L (ref 22–32)
pCO2, Ven: 54.9 mmHg (ref 44–60)
pH, Ven: 7.253 (ref 7.25–7.43)
pO2, Ven: 48 mmHg — ABNORMAL HIGH (ref 32–45)

## 2024-07-30 LAB — I-STAT CG4 LACTIC ACID, ED: Lactic Acid, Venous: 1.6 mmol/L (ref 0.5–1.9)

## 2024-07-30 LAB — BRAIN NATRIURETIC PEPTIDE: B Natriuretic Peptide: 837.4 pg/mL — ABNORMAL HIGH (ref 0.0–100.0)

## 2024-07-30 LAB — TROPONIN I (HIGH SENSITIVITY)
Troponin I (High Sensitivity): 374 ng/L
Troponin I (High Sensitivity): 313 ng/L (ref <18)

## 2024-07-30 MED ORDER — AMIODARONE HCL 200 MG PO TABS
200.0000 mg | ORAL_TABLET | Freq: Two times a day (BID) | ORAL | Status: DC
Start: 2024-08-04 — End: 2024-08-04

## 2024-07-30 MED ORDER — SODIUM CHLORIDE 0.9% FLUSH
3.0000 mL | Freq: Two times a day (BID) | INTRAVENOUS | Status: DC
  Administered 2024-07-31 – 2024-08-04 (×9): 3 mL via INTRAVENOUS

## 2024-07-30 MED ORDER — ACETAMINOPHEN 325 MG PO TABS: 650.0000 mg | ORAL_TABLET | Freq: Four times a day (QID) | ORAL | Status: DC | PRN

## 2024-07-30 MED ORDER — AMIODARONE HCL 200 MG PO TABS
400.0000 mg | ORAL_TABLET | Freq: Two times a day (BID) | ORAL | Status: AC
  Administered 2024-07-31 – 2024-08-04 (×9): 400 mg via ORAL
  Filled 2024-07-30 (×9): qty 2

## 2024-07-30 MED ORDER — METOPROLOL TARTRATE 50 MG PO TABS
50.0000 mg | ORAL_TABLET | Freq: Two times a day (BID) | ORAL | Status: DC
Start: 2024-07-31 — End: 2024-08-04
  Administered 2024-07-31 – 2024-08-04 (×8): 50 mg via ORAL
  Filled 2024-07-30 (×4): qty 1
  Filled 2024-07-30: qty 2
  Filled 2024-07-30 (×4): qty 1

## 2024-07-30 MED ORDER — ONDANSETRON HCL 4 MG PO TABS: 4.0000 mg | ORAL_TABLET | Freq: Four times a day (QID) | ORAL | Status: DC | PRN

## 2024-07-30 MED ORDER — ONDANSETRON HCL 4 MG/2ML IJ SOLN: 4.0000 mg | Freq: Four times a day (QID) | INTRAMUSCULAR | Status: DC | PRN

## 2024-07-30 MED ORDER — ACETAMINOPHEN 650 MG RE SUPP: 650.0000 mg | Freq: Four times a day (QID) | RECTAL | Status: DC | PRN

## 2024-07-30 MED ORDER — FUROSEMIDE 10 MG/ML IJ SOLN
40.0000 mg | Freq: Two times a day (BID) | INTRAMUSCULAR | Status: DC
  Administered 2024-07-31: 40 mg via INTRAVENOUS
  Filled 2024-07-30: qty 4

## 2024-07-30 MED ORDER — APIXABAN 5 MG PO TABS
5.0000 mg | ORAL_TABLET | Freq: Two times a day (BID) | ORAL | Status: DC
Start: 2024-07-31 — End: 2024-08-01
  Administered 2024-07-31 – 2024-08-01 (×3): 5 mg via ORAL
  Filled 2024-07-30 (×3): qty 1

## 2024-07-30 MED ORDER — ATORVASTATIN CALCIUM 40 MG PO TABS
40.0000 mg | ORAL_TABLET | Freq: Every day | ORAL | Status: DC
  Administered 2024-07-31 – 2024-08-04 (×5): 40 mg via ORAL
  Filled 2024-07-30 (×5): qty 1

## 2024-07-30 MED ORDER — SENNOSIDES-DOCUSATE SODIUM 8.6-50 MG PO TABS
1.0000 | ORAL_TABLET | Freq: Every evening | ORAL | Status: DC | PRN
  Administered 2024-08-01: 1 via ORAL
  Filled 2024-07-30: qty 1

## 2024-07-30 NOTE — ED Provider Notes (Signed)
 [place] EMERGENCY DEPARTMENT AT [facility] Provider Note   CSN: [id number] Arrival date & time: 07/30/24  8169     Patient presents with: Respiratory Distress   Philip [name] is a 83 y.o. male.  {Add pertinent medical, surgical, social history, OB history to HPI:32947} HPI     Prior to Admission medications   Medication Sig Start Date End Date Taking? Authorizing Provider  amiodarone  (PACERONE ) 200 MG tablet Take 2 tablets (400 mg total) by mouth 2 (two) times daily for 7 days, THEN 1 tablet (200 mg total) 2 (two) times daily. 07/28/24 10/03/24  Fairy Frames, MD  Ascorbic Acid (VITAMIN C ER PO) Take 500 mg by mouth daily.    [provider]  atorvastatin  (LIPITOR) 40 MG tablet TAKE 1 TABLET EVERY DAY (NEED APPOINTMENT FOR REFILLS) 07/22/24   Court Dorn PARAS, MD  B Complex-Biotin-FA (B-COMPLEX PO) Take 1 tablet by mouth daily.    [provider]  ceFAZolin  (ANCEF ) IVPB Inject 6 g into the vein daily for 5 days. Indication:  MSSA endocarditis First Dose: Yes Last Day of Therapy:  10/1 Labs - Once weekly:  CBC/D and BMP, Labs - Once weekly: ESR and CRP Method of administration: continuous infusion - 6g daily as a continuous infusion thru 10/1 Method of administration may be changed at the discretion of home infusion pharmacist based upon assessment of the patient and/or caregiver's ability to self-administer the medication ordered. 07/26/24 07/31/24  Vu, Constance T, MD  co-enzyme Q-10 30 MG capsule Take 30 mg by mouth daily.    [provider]  ELIQUIS  5 MG TABS tablet TAKE 1 TABLET TWICE DAILY 02/07/24   Dick, Ernest H Jr., NP  empagliflozin  (JARDIANCE ) 10 MG TABS tablet Take 1 tablet (10 mg total) by mouth daily. 07/28/24   Fairy Frames, MD  furosemide  (LASIX ) 40 MG tablet Take 1 tablet (40 mg total) by mouth daily. 07/28/24   Fairy Frames, MD  magnesium  oxide (MAG-OX) 400 MG tablet Take 800 mg by mouth daily.    [provider]   metoprolol  tartrate (LOPRESSOR ) 50 MG tablet Take 1 tablet (50 mg total) by mouth 2 (two) times daily. 06/27/24   Drusilla Sabas RAMAN, MD  Multiple Vitamins-Minerals (MULTIVITAMIN WITH MINERALS) tablet Take 1 tablet by mouth daily.    [provider]  Omega-3 Fatty Acids (OMEGA-3 FISH OIL ) 1200 MG CAPS Take 2,400 mg by mouth daily. 11/11/21   Eckard, Tammy, RPH-CPP  OXYGEN Inhale 2.5 L/min into the lungs continuous.    [provider]  polyethylene glycol (MIRALAX  / GLYCOLAX ) packet Take 17 g by mouth daily.    [provider]  spironolactone  (ALDACTONE ) 25 MG tablet Take 1 tablet (25 mg total) by mouth daily. 07/28/24   Fairy Frames, MD    Allergies: Patient has no known allergies.    Review of Systems  Updated Vital Signs BP (!) 172/109   Pulse (!) 112   Temp 97.7 F (36.5 C) (Axillary)   Resp 20   SpO2 98%   Physical Exam  (all labs ordered are listed, but only abnormal results are displayed) Labs Reviewed  I-STAT VENOUS BLOOD GAS, ED - Abnormal; Notable for the following components:      Result Value   pO2, Ven 48 (*)    Acid-base deficit 4.0 (*)    Sodium 133 (*)    Calcium , Ion 1.14 (*)    All other components within normal limits  I-STAT CHEM 8, ED -  Abnormal; Notable for the following components:   Sodium 133 (*)    Chloride 97 (*)    BUN 43 (*)    Creatinine, Ser 2.40 (*)    Glucose, Bld 260 (*)    Calcium , Ion 1.13 (*)    All other components within normal limits  CULTURE, BLOOD (ROUTINE X 2)  CULTURE, BLOOD (ROUTINE X 2)  COMPREHENSIVE METABOLIC PANEL WITH GFR  CBC WITH DIFFERENTIAL/PLATELET  BRAIN NATRIURETIC PEPTIDE  I-STAT CG4 LACTIC ACID, ED  TROPONIN I (HIGH SENSITIVITY)    EKG: None  Radiology: No results found.  {Document cardiac monitor, telemetry assessment procedure when appropriate:32947} Procedures   Medications Ordered in the ED - No data to display  Clinical Course as of 07/30/24 1939  Tue Jul 30, 2024  1931  Critical care consulted and thinks the patient could potentially go to stepdown.  I will place a consult out to the hospitalist [AL]    Clinical Course User Index [AL] Ladora Osterberg, DO   {Click here for ABCD2, HEART and other calculators REFRESH Note before signing:1}                              Medical Decision Making 83 year old male brought in by EMS for respiratory distress. 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  He has PICC line in place  He came in in severe respiratory distress and appeared very fatigued. Blood pressure elevated in the 170s systolic He was placed on BiPAP and had significant improvement in his work of breathing and mental status.  Chest x-ray shows bilateral vascular congestion.  EMS reports that they gave him 1 dose of sublingual nitroglycerin and his blood pressure decreased to 90/50.  With improvement on BiPAP we have not proceeded with nitroglycerin. Patient has a significant increase in his creatinine from 1.59-2.4 over 2 days He is currently in A-fib RVR rate around 110 pH 7.25 CO2 54.9 O2 48 Patient currently on 100% FiO2, PEEP 10, rate 10  Amount and/or Complexity of Data Reviewed Labs: ordered. Radiology: ordered.  Patient has a significant increase in his creatinine from 1.59-2.4 over 2 days   Final diagnoses:  None    ED Discharge Orders     None

## 2024-07-30 NOTE — H&P (Signed)
 History and Physical    Philip [name] FMW:[id number] DOB: [birth year] DOA: 07/30/2024  PCP: [name], MD  Patient coming from: Home  I have personally briefly reviewed patient's old medical records in [facility]  Chief Complaint: Shortness of breath  HPI: Philip [name] is a 83 y.o. 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  Patient was recently admitted 07/21/24-07/28/24 with acute on chronic HFpEF.  He had known MSSA MV endocarditis identified on prior TEE 8/25 and was not felt a surgical candidate by CT surgery at the time.    Repeat TEE showed large vegetation with leaflet perforation and severe mitral regurgitation.  CT surgery again reconsulted and again felt patient was a poor surgical candidate.  Patient was continued on IV Ancef  through 07/31/2024 per ID recommendations.    He was diuresed with Lasix  and was 10 L negative during hospital stay.  He had new oxygen requirement and was discharged on 2 L supplemental O2 via [place].    He was eager to return home for his wife's birthday.  He was seen by palliative care during admission and CODE STATUS was determined to be DNR/DNI.  Outpatient palliative care follow-up was recommended with anticipation of transition to hospice when he further declines.  Son is at bedside to provide additional history.  Son states that patient did well initially when he left the hospital.  He was able to attend a party and breathing was stable while on 2-3 L supplemental oxygen via [place].  He has been having a lot of trouble sleeping due to shortness of breath despite keeping head elevated, diuretics, and using CPAP at nighttime.  Patient developed significant worsening shortness of  breath yesterday.  Dyspnea persisted despite increasing his home oxygen.  EMS were called to the house.  He was noted to have SpO2 70% on room air.  He was placed on 15 L with SpO2 78%.  He was hypertensive with BP 219/100.  He was given 1 nitroglycerin and blood pressure dropped to 90/50.  He was given albuterol  and Atrovent treatment and brought to the ED for further evaluation.  ED Course  Labs/Imaging on admission: I have personally reviewed following labs and imaging studies.  Initial vitals showed BP 172/109, pulse 103, RR 22, temp 97.7 F, SpO2 98% on BiPAP.  Labs showed WBC 10.3, hemoglobin 13.5, platelets 162, sodium 133, potassium 3.8, bicarb 22, BUN 44, creatinine 2.39, serum glucose 252, troponin 374 > 313, BNP 837.4.  Portable chest x-ray showed worsening edema and layering bilateral pleural effusions.  Patient was placed on BiPAP with stabilization of respiratory status.  The hospitalist service was consulted for admission.  Review of Systems: All systems reviewed and are negative except as documented in history of present illness above.   Past Medical History:  Diagnosis Date   AAA (abdominal aortic aneurysm)    Arthritis    Atrial fibrillation (HCC)    CAD (coronary artery disease)    had a MI , s/p CABG   Cataracts, bilateral    immature   CHF (congestive heart failure) (HCC)    Dysrhythmia    paroximal a fib   GERD (gastroesophageal reflux disease)    takes Omeprazole  daily   History  of colon polyps    History of kidney stones    History of shingles    Hyperlipidemia    Hypertension    takes Amlodipine ,Metoprolol ,and Lisinopril  daily   Joint pain    Joint swelling    Myocardial infarction [facility])    several with last one being 2001(but never knew about any except 2001)   OSA (obstructive sleep apnea)    started CPAP 12/09   Peripheral vascular disease    Peyronie's disease    s/p penile implant in 2006   Pneumonia    as a child   Skin cancer    several  , one of them was melanoma (sees derm routinely)   Sleep apnea    Tingling    feet occasionally   Tinnitus     Past Surgical History:  Procedure Laterality Date   ABDOMINAL AORTAGRAM N/A 06/16/2014   Procedure: ABDOMINAL EZELLA;  Surgeon: Lonni GORMAN Blade, MD;  Location: [facility] CATH LAB;  Service: Cardiovascular;  Laterality: N/A;   ABDOMINAL AORTIC ENDOVASCULAR STENT GRAFT N/A 07/08/2014   Procedure: ABDOMINAL AORTIC ENDOVASCULAR STENT GRAFT/ GORE;  Surgeon: Lonni GORMAN Blade, MD;  Location: [facility] OR;  Service: Vascular;  Laterality: N/A;   CARDIAC CATHETERIZATION  10/31/2013   COLONOSCOPY     CORONARY ARTERY BYPASS GRAFT  10/31/1997   x 3 vessels   LEFT HEART CATH AND CORS/GRAFTS ANGIOGRAPHY N/A 10/07/2021   Procedure: LEFT HEART CATH AND CORS/GRAFTS ANGIOGRAPHY;  Surgeon: Verlin Lonni BIRCH, MD;  Location: [facility] INVASIVE CV LAB;  Service: Cardiovascular;  Laterality: N/A;   LEFT HEART CATHETERIZATION WITH CORONARY/GRAFT ANGIOGRAM N/A 05/29/2014   Procedure: LEFT HEART CATHETERIZATION WITH EL BILE;  Surgeon: Ezra GORMAN Shuck, MD;  Location: [facility] CATH LAB;  Service: Cardiovascular;  Laterality: N/A;   PENILE PROSTHESIS IMPLANT  08/31/2005   AMS inflatable penile prosthesis   RHINOPLASTY  10/31/1973   TRANSESOPHAGEAL ECHOCARDIOGRAM (CATH LAB) N/A 06/24/2024   Procedure: TRANSESOPHAGEAL ECHOCARDIOGRAM;  Surgeon: Loni Soyla LABOR, MD;  Location: [facility] INVASIVE CV LAB;  Service: Cardiovascular;  Laterality: N/A;   TRANSESOPHAGEAL ECHOCARDIOGRAM (CATH LAB) N/A 07/25/2024   Procedure: TRANSESOPHAGEAL ECHOCARDIOGRAM;  Surgeon: Pietro Redell GORMAN, MD;  Location: [facility] INVASIVE CV LAB;  Service: Cardiovascular;  Laterality: N/A;    Social History: Social History   Tobacco Use   Smoking status: Former    Current packs/day: 0.00    Types: Cigarettes    Quit date: 1979    Years since quitting: 46.7   Smokeless tobacco: Never   Tobacco comments:    quit smoking in 1979   Vaping Use   Vaping status: Never Used  Substance Use Topics   Alcohol use: Yes    Comment: occasionally - once per week   Drug use: No   No Known Allergies  Family History  Problem Relation Age of Onset   Prostate cancer Father 26   Heart disease Father    Skin cancer Father    Heart attack Father    Cancer Father    Deep vein thrombosis Father    Hyperlipidemia Father    Hypertension Father    Heart disease Mother    Skin cancer Mother    Heart attack Mother    Cancer Mother    Hyperlipidemia Mother    Hypertension Mother    Varicose Veins Mother    Peripheral vascular disease Mother        amputation   Sleep apnea Brother    Hyperlipidemia Brother    Hypertension  Brother    Prostate cancer Brother 81   Colon cancer Other        aunt   Skin cancer Brother    Skin cancer Sister    Cancer Sister    Heart disease Sister    Diabetes Neg Hx    Stroke Neg Hx      Prior to Admission medications   Medication Sig Start Date End Date Taking? Authorizing Provider  amiodarone  (PACERONE ) 200 MG tablet Take 2 tablets (400 mg total) by mouth 2 (two) times daily for 7 days, THEN 1 tablet (200 mg total) 2 (two) times daily. 07/28/24 10/03/24  Fairy Frames, MD  Ascorbic Acid (VITAMIN C ER PO) Take 500 mg by mouth daily.    [provider]  atorvastatin  (LIPITOR) 40 MG tablet TAKE 1 TABLET EVERY DAY (NEED APPOINTMENT FOR REFILLS) 07/22/24   Court Dorn PARAS, MD  B Complex-Biotin-FA (B-COMPLEX PO) Take 1 tablet by mouth daily.    [provider]  ceFAZolin  (ANCEF ) IVPB Inject 6 g into the vein daily for 5 days. Indication:  MSSA endocarditis First Dose: Yes Last Day of Therapy:  10/1 Labs - Once weekly:  CBC/D and BMP, Labs - Once weekly: ESR and CRP Method of administration: continuous infusion - 6g daily as a continuous infusion thru 10/1 Method of administration may be changed at the discretion of home infusion pharmacist based upon assessment of the  patient and/or caregiver's ability to self-administer the medication ordered. 07/26/24 07/31/24  Vu, Constance T, MD  co-enzyme Q-10 30 MG capsule Take 30 mg by mouth daily.    [provider]  ELIQUIS  5 MG TABS tablet TAKE 1 TABLET TWICE DAILY 02/07/24   Dick, Ernest H Jr., NP  empagliflozin  (JARDIANCE ) 10 MG TABS tablet Take 1 tablet (10 mg total) by mouth daily. 07/28/24   Fairy Frames, MD  furosemide  (LASIX ) 40 MG tablet Take 1 tablet (40 mg total) by mouth daily. 07/28/24   Fairy Frames, MD  magnesium  oxide (MAG-OX) 400 MG tablet Take 800 mg by mouth daily.    [provider]  metoprolol  tartrate (LOPRESSOR ) 50 MG tablet Take 1 tablet (50 mg total) by mouth 2 (two) times daily. 06/27/24   Drusilla Sabas RAMAN, MD  Multiple Vitamins-Minerals (MULTIVITAMIN WITH MINERALS) tablet Take 1 tablet by mouth daily.    [provider]  Omega-3 Fatty Acids (OMEGA-3 FISH OIL ) 1200 MG CAPS Take 2,400 mg by mouth daily. 11/11/21   Eckard, Tammy, RPH-CPP  OXYGEN Inhale 2.5 L/min into the lungs continuous.    [provider]  polyethylene glycol (MIRALAX  / GLYCOLAX ) packet Take 17 g by mouth daily.    [provider]  spironolactone  (ALDACTONE ) 25 MG tablet Take 1 tablet (25 mg total) by mouth daily. 07/28/24   Fairy Frames, MD    Physical Exam: Vitals:   07/30/24 1848 07/30/24 2200 07/30/24 2216 07/30/24 2300  BP:  124/76  120/61  Pulse:  87  62  Resp: 20 16  16   Temp:   98 F (36.7 C)   TempSrc:   Axillary   SpO2:  100%  100%   Constitutional: Chronically ill-appearing elderly man resting in bed with head elevated, currently wearing BiPAP.  Appears fatigued but will open eyes and nod to voice. Eyes: EOMI, lids and conjunctivae normal ENMT: Mucous membranes are moist. Neck: normal, supple, no masses. Respiratory: clear to auscultation anteriorly while on BiPAP. Normal respiratory effort while on BiPAP. No accessory muscle use.  Cardiovascular:  Regular rate and  rhythm, systolic murmur throughout.  Trace bilateral lower extremity edema. 2+ pedal pulses. Abdomen: Soft, no tenderness Musculoskeletal: no clubbing / cyanosis. No joint deformity upper and lower extremities. Good ROM, no contractures. Normal muscle tone.  Skin: no rashes, lesions, ulcers. No induration Neurologic: Sensation intact. Strength equal bilaterally. Psychiatric: Alert and oriented x 3.  EKG: Personally reviewed. Atrial fibrillation, rate 109, QTc 590.  Previous EKG showed sinus rhythm with first-degree AV block.  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  Assessment and Plan: Acute on chronic HFpEF: In setting of MV endocarditis with severe mitral regurgitation and leaflet perforation.  EF 55-60% by TEE 07/25/2024.  CXR shows worsening pulmonary edema and layering bilateral pleural effusions.  BNP 837.4.  Troponin is elevated but significantly decreased from prior, likely demand in setting of CHF exacerbation. - IV Lasix  40 mg twice daily - Continue Lopressor  50 mg twice daily - Hold spironolactone  and Jardiance  given AKI - Strict I/O's and daily weights - Continue BiPAP  as needed  MSSA endocarditis of mitral valve Severe mitral regurgitation and leaflet perforation: Noted on TEE 07/25/2024 with enlarging vegetation.  Previously evaluated by cardiothoracic surgery and not felt to be a surgical candidate.  This is contributing to his pulmonary edema and hypoxic respiratory failure. - Continue IV Ancef   Acute hypoxemic respiratory failure: Secondary to pulmonary edema/HFpEF exacerbation.  Has been on 2-3 L [place] at home but requiring BiPAP on admission. - Continue BiPAP when sleeping and as needed - Diuretics as above  Acute kidney injury superimposed on CKD stage IIIb: Creatinine 2.39 on admission compared to baseline 1.5-1.6.  Suspect cardiorenal process. - Continue IV Lasix  as above - Holding spironolactone  and Jardiance  - Monitor UOP  Paroxysmal atrial fibrillation: Initial EKG showed A-fib, now he is in sinus rhythm on admission with rate in the 60s.  Continue Lopressor , amiodarone , and Eliquis .  CAD s/p CABG: Troponin elevation likely related to acute on chronic HFpEF.  Continue Lopressor , Eliquis , atorvastatin .  Hypertension: Continue Lopressor .  Holding spironolactone  as above.  Hyperlipidemia: Continue atorvastatin .  OSA: On CPAP at home, using BiPAP in hospital.  Goals of care: Discussed at length with patient's son at bedside.  CODE STATUS is confirmed to be DNR/DNI.  Discussed that if there is any decompensation or minimal improvement then we would recommend transition to comfort care/hospice.   DVT prophylaxis:  apixaban  (ELIQUIS ) tablet 5 mg   Code Status:   Code Status: Limited: Do not attempt resuscitation (DNR) -DNR-LIMITED -Do Not Intubate/DNI   discussed and confirmed with patient's son on admission. Family Communication: Son at bedside Disposition Plan: From home, dispo pending clinical progress Consults called: None Severity of Illness: The appropriate patient status for this  patient is INPATIENT. Inpatient status is judged  to be reasonable and necessary in order to provide the required intensity of service to ensure the patient's safety. The patient's presenting symptoms, physical exam findings, and initial radiographic and laboratory data in the context of their chronic comorbidities is felt to place them at high risk for further clinical deterioration. Furthermore, it is not anticipated that the patient will be medically stable for discharge from the hospital within 2 midnights of admission.   * I certify that at the point of admission it is my clinical judgment that the patient will require inpatient hospital care spanning beyond 2 midnights from the point of admission due to high intensity of service, high risk for further deterioration and high frequency of surveillance required.DEWAINE Jorie Blanch MD [facility]  If 7PM-7AM, please contact night-coverage [email or web address]  07/31/2024, 12:02 AM

## 2024-07-30 NOTE — Hospital Course (Signed)
 Philip [name] is a 83 y.o. 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

## 2024-07-30 NOTE — ED Provider Notes (Signed)
 Discussed code status with pt and family at bedside  Patient does not want to be a full code including intubation at this time   [place] No, DO 07/30/24 2005

## 2024-07-30 NOTE — ED Triage Notes (Signed)
 Shob starting yesterday 70% RA, 78% oN 15L. Rales throughout.  1 Nitroglycerin given. 219/100, 90/50 after. 10 mg Albuterol  given. 0.5 Atrovent given., Cpap discontinued due to hypotension. Afib Hx.

## 2024-07-30 NOTE — Progress Notes (Signed)
 PT still on BIPAP at this time. O2 weaned to 60%.   07/30/24 2352  BiPAP/CPAP/SIPAP  BiPAP/CPAP/SIPAP Pt Type Adult  BiPAP/CPAP/SIPAP SERVO  Reason BIPAP/CPAP not in use Other(comment) (in use)  Mask Type Full face mask  Dentures removed? Not applicable  Mask Size Small  Respiratory Rate 10 breaths/min  IPAP 18 cmH20  EPAP 10 cmH2O  Pressure Support 8 cmH20  PEEP 10 cmH20  FiO2 (%) 60 %  Minute Ventilation 18.1  Leak 45  Peak Inspiratory Pressure (PIP) 18  Tidal Volume (Vt) 731  Patient Home Machine No  Patient Home Mask No  Patient Home Tubing No  Auto Titrate No  Press High Alarm 30 cmH2O  Nasal massage performed Yes  CPAP/SIPAP surface wiped down Yes  Device Plugged into RED Power Outlet Yes

## 2024-07-30 NOTE — Progress Notes (Signed)
 Patient arrived by EMS with noted respiratory distress.  Patient was placed on bipap and is currently tolerating well.  Will continue to monitor.

## 2024-07-30 NOTE — Progress Notes (Signed)
 PT looks good on BIPAP at this time.  07/30/24 1957  BiPAP/CPAP/SIPAP  BiPAP/CPAP/SIPAP Pt Type Adult  BiPAP/CPAP/SIPAP SERVO  Reason BIPAP/CPAP not in use Other(comment) (in use)  Mask Type Full face mask  Dentures removed? Not applicable  Mask Size Small  Respiratory Rate 10 breaths/min  IPAP 18 cmH20  EPAP 10 cmH2O  Pressure Support 8 cmH20  PEEP 10 cmH20  FiO2 (%) 100 %  Minute Ventilation 17.6  Leak 43  Peak Inspiratory Pressure (PIP) 17  Tidal Volume (Vt) 756  Patient Home Machine No  Patient Home Mask No  Patient Home Tubing No  Auto Titrate No  Press High Alarm 30 cmH2O  Nasal massage performed Yes  CPAP/SIPAP surface wiped down Yes  Device Plugged into RED Power Outlet Yes

## 2024-07-31 ENCOUNTER — Encounter (HOSPITAL_COMMUNITY): Payer: Self-pay | Admitting: Internal Medicine

## 2024-07-31 DIAGNOSIS — I5033 Acute on chronic diastolic (congestive) heart failure: Secondary | ICD-10-CM | POA: Diagnosis not present

## 2024-07-31 LAB — CBC
HCT: 33.3 % — ABNORMAL LOW (ref 39.0–52.0)
Hemoglobin: 11.3 g/dL — ABNORMAL LOW (ref 13.0–17.0)
MCH: 32.4 pg (ref 26.0–34.0)
MCHC: 33.9 g/dL (ref 30.0–36.0)
MCV: 95.4 fL (ref 80.0–100.0)
Platelets: 110 K/uL — ABNORMAL LOW (ref 150–400)
RBC: 3.49 MIL/uL — ABNORMAL LOW (ref 4.22–5.81)
RDW: 13.3 % (ref 11.5–15.5)
WBC: 6.4 K/uL (ref 4.0–10.5)
nRBC: 0 % (ref 0.0–0.2)

## 2024-07-31 LAB — BASIC METABOLIC PANEL WITH GFR
Anion gap: 12 (ref 5–15)
BUN: 44 mg/dL — ABNORMAL HIGH (ref 8–23)
CO2: 25 mmol/L (ref 22–32)
Calcium: 8.4 mg/dL — ABNORMAL LOW (ref 8.9–10.3)
Chloride: 98 mmol/L (ref 98–111)
Creatinine, Ser: 2.24 mg/dL — ABNORMAL HIGH (ref 0.61–1.24)
GFR, Estimated: 28 mL/min — ABNORMAL LOW (ref >60)
Glucose, Bld: 105 mg/dL — ABNORMAL HIGH (ref 70–99)
Potassium: 4 mmol/L (ref 3.5–5.1)
Sodium: 135 mmol/L (ref 135–145)

## 2024-07-31 LAB — MAGNESIUM: Magnesium: 2.3 mg/dL (ref 1.7–2.4)

## 2024-07-31 MED ORDER — CEFAZOLIN SODIUM-DEXTROSE 2-4 GM/100ML-% IV SOLN
2.0000 g | Freq: Three times a day (TID) | INTRAVENOUS | Status: DC
Start: 2024-07-31 — End: 2024-07-31

## 2024-07-31 MED ORDER — CEFAZOLIN SODIUM-DEXTROSE 2-4 GM/100ML-% IV SOLN
2.0000 g | Freq: Two times a day (BID) | INTRAVENOUS | Status: DC
Start: 2024-07-31 — End: 2024-08-01
  Administered 2024-07-31 – 2024-08-01 (×4): 2 g via INTRAVENOUS
  Filled 2024-07-31 (×4): qty 100

## 2024-07-31 MED ORDER — FUROSEMIDE 10 MG/ML IJ SOLN
60.0000 mg | Freq: Two times a day (BID) | INTRAMUSCULAR | Status: DC
  Administered 2024-07-31 – 2024-08-04 (×9): 60 mg via INTRAVENOUS
  Filled 2024-07-31 (×9): qty 6

## 2024-07-31 NOTE — ED Notes (Signed)
 CCMD called to place patient on monitor

## 2024-07-31 NOTE — Progress Notes (Signed)
   07/31/24 1153  Vent Select  Invasive or Noninvasive Noninvasive  Adult Vent Y  Adult Ventilator Settings  Vent Type Servo i  Vent Mode BIPAP  Set Rate 10 bmp  FiO2 (%) (S)  60 % (Per Sats)  I Time 0.9 Sec(s)  IPAP 18 cmH20  EPAP 8 cmH20  Pressure Control 10 cmH20  PEEP 8 cmH20  Adult Ventilator Measurements  Peak Airway Pressure 21 L/min  Mean Airway Pressure 8 cmH20  Resp Rate Spontaneous 18 br/min  Resp Rate Total 28 br/min  Exhaled Vt 992 mL  Measured Ve 24 L  I:E Ratio Measured 1:1.7  SpO2 (!) 89 %  Adult Ventilator Alarms  Alarms On Y  Ve High Alarm 28 L/min  Ve Low Alarm 6 L/min  Resp Rate High Alarm 38 br/min  Resp Rate Low Alarm 10  PEEP Low Alarm 6 cmH2O  Press High Alarm 30 cmH2O  T Apnea 20 sec(s)  VAP Prevention  HOB> 30 Degrees Y  Equipment wiped down Yes  Vent Respiratory Assessment  Respiratory Pattern Irregular;Labored     RT placed pt on BIPAP due to increased WOB.

## 2024-07-31 NOTE — Progress Notes (Signed)
 Pharmacy Antibiotic Note  Philip [name] is a 83 y.o. male admitted on 07/30/2024 with MSSA infective endocarditis.  Pharmacy has been consulted for cefazolin  dosing.  Pt has been on continuous cefazolin  infusion since discharge with plan to end 10/1.  Now w/ AKI.  Plan: Cefazolin  2g IV Q12H for now; f/u SCr and LOT.  Temp (24hrs), Avg:97.9 F (36.6 C), Min:97.7 F (36.5 C), Max:98 F (36.7 C)  Recent Labs  Lab 07/25/24 0152 07/26/24 0614 07/27/24 0525 07/28/24 0435 07/30/24 1842 07/30/24 1900 07/30/24 2236  WBC 7.7  --   --   --  10.3  --   --   CREATININE 1.45* 1.54* 1.69* 1.59* 2.39* 2.40*  --   LATICACIDVEN  --   --   --   --   --   --  1.6    Estimated Creatinine Clearance: 26.3 mL/min (A) (by C-G formula based on SCr of 2.4 mg/dL (H)).    No Known Allergies   Thank you for allowing pharmacy to be a part of this patient's care.  Marvetta Dauphin, PharmD, BCPS  07/31/2024 12:15 AM

## 2024-07-31 NOTE — TOC Initial Note (Signed)
 Transition of Care [facility]) - Initial/Assessment Note    Patient Details  Name: Philip [name] MRN: [id number] Date of Birth: 1941-05-24  Transition of Care [facility]) CM/SW Contact:    Roxie KANDICE Stain, RN Phone Number: 07/31/2024, 3:07 PM  Clinical Narrative:           Spoke to patient and wife about home hospice. Medicare.gov list given to wife. She would like to review and make her decision tomorrow. This RNCM will reach out to Wife tomorrow. Patient has home 02 from [employer].and a walker.         Expected Discharge Plan: Home w Hospice Care Barriers to Discharge: Continued Medical Work up   Patient Goals and CMS Choice Patient states their goals for this hospitalization and ongoing recovery are:: plan home with hospice CMS Medicare.gov Compare Post Acute Care list provided to:: Patient Represenative (must comment) Choice offered to / list presented to : Spouse      Expected Discharge Plan and Services   Discharge Planning Services: CM Consult Post Acute Care Choice: Hospice Living arrangements for the past 2 months: Single Family Home                                      Prior Living Arrangements/Services Living arrangements for the past 2 months: Single Family Home Lives with:: Spouse Patient language and need for interpreter reviewed:: Yes Do you feel safe going back to the place where you live?: Yes      Need for Family Participation in Patient Care: Yes (Comment) Care giver support system in place?: Yes (comment) Current home services: DME (walker, home 02-[employer]) Criminal Activity/Legal Involvement Pertinent to Current Situation/Hospitalization: No - Comment as needed  Activities of Daily Living   ADL Screening (condition at time of admission) Independently performs ADLs?: Yes (appropriate for developmental age) Is the patient deaf or have difficulty hearing?: No Does the patient have difficulty seeing, even when wearing glasses/contacts?: No Does the  patient have difficulty concentrating, remembering, or making decisions?: No  Permission Sought/Granted                  Emotional Assessment Appearance:: Appears stated age Attitude/Demeanor/Rapport: Engaged, Gracious Affect (typically observed): Accepting Orientation: : Oriented to Self, Oriented to Place, Oriented to  Time, Oriented to Situation Alcohol / Substance Use: Not Applicable Psych Involvement: No (comment)  Admission diagnosis:  Acute on chronic heart failure with preserved ejection fraction (HFpEF, >= 50%) (HCC) [I50.33] Patient Active Problem List   Diagnosis Date Noted   Acute on chronic heart failure with preserved ejection fraction (HFpEF, >= 50%) (HCC) 07/30/2024   Acute kidney injury superimposed on chronic kidney disease 07/30/2024   Acute respiratory failure with hypoxia (HCC) 07/30/2024   Severe mitral valve regurgitation 07/25/2024   Acute on chronic diastolic CHF (congestive heart failure) (HCC) 07/22/2024   Chronic kidney disease, stage 3a (HCC) 07/22/2024   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) 07/22/2024   Atrial fibrillation with rapid ventricular response (HCC) 07/22/2024   CHF (congestive heart failure) (HCC) 07/21/2024   Endocarditis of mitral valve 07/09/2024   Peripherally inserted central catheter (PICC) in place 07/09/2024   Surgical site infection 06/19/2024   Sepsis (HCC) 06/18/2024   MSSA bacteremia 06/18/2024   Fever 06/17/2024   Altered mental status 06/17/2024   Cough 12/05/2022   Postural dizziness with presyncope 10/06/2021   Elevated TSH 10/06/2021  CAD (coronary artery disease) 10/06/2021   AAA (abdominal aortic aneurysm) 10/06/2021   Renal mass 10/06/2021   Elevated troponin 10/06/2021   NSTEMI (non-ST elevated myocardial infarction) (HCC) 10/06/2021   Skin cancer of face 08/05/2020   Depression 04/02/2019   Lower extremity edema 12/22/2017   Coronary artery disease  involving native coronary artery of native heart without angina pectoris 12/22/2017   Other specified hypothyroidism 10/02/2017   Osteoarthritis of left hip 06/26/2017   Obesity (BMI 30.0-34.9) 05/08/2017   Hyperglycemia 04/19/2017   Vitamin D  deficiency 03/29/2017   Idiopathic peripheral neuropathy 03/13/2017   Erectile dysfunction 02/22/2017   PCP NOTES >>>>>>>>>>>>>>>>>>>>>>>>>>>>>>>>>>>>>>> 12/21/2015   Chronic constipation 12/21/2015   Paroxysmal atrial fibrillation (HCC) 05/07/2014   Encounter for pre-operative cardiovascular clearance 05/07/2014   AAA (abdominal aortic aneurysm) without rupture 04/30/2014   Fatigue 11/25/2013   Hypomagnesemia 06/29/2011   Annual physical exam 02/16/2011   Insomnia 02/16/2011   OSA on CPAP    History of skin cancer 07/24/2008   Coronary atherosclerosis 07/04/2008   Mixed hyperlipidemia 02/08/2007   Essential hypertension 02/08/2007   PCP:  [name], MD Pharmacy:   [facility] ([address]), [place] - [address] [place] ([place]) [place] [address] Phone: [phone] Fax: [phone]  [facility] - [place], [place] - [address] [place] [address] Phone: [phone] Fax: [phone]     Social Drivers of Health (SDOH) Social History: SDOH Screenings   Food Insecurity: No Food Insecurity (07/31/2024)  Housing: Low Risk  (07/31/2024)  Transportation Needs: No Transportation Needs (07/31/2024)  Utilities: Not At Risk (07/31/2024)  Alcohol Screen: Low Risk  (05/15/2024)  Depression (PHQ2-9): Low Risk  (07/12/2024)  Recent Concern: Depression (PHQ2-9) - Medium Risk (07/05/2024)  Financial Resource Strain: Low Risk  (05/15/2024)  Physical Activity: Sufficiently Active (05/15/2024)  Social Connections: Moderately Integrated (07/31/2024)  Stress: No Stress Concern Present (05/15/2024)  Tobacco Use: Medium Risk (07/31/2024)   SDOH Interventions:     Readmission  Risk Interventions    07/23/2024   11:00 AM 06/18/2024    3:45 PM  Readmission Risk Prevention Plan  Transportation Screening Complete Complete  PCP or Specialist Appt within 5-7 Days Complete   Home Care Screening Complete Complete  Medication Review (RN CM) Complete Complete

## 2024-07-31 NOTE — Progress Notes (Signed)
 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  -Just discharged from [facility] 9/21-28, treated for CHF, TEE noted worsening endocarditis with perforation and severe mitral regurgitation, seen by CT surgery again felt to be a poor surgical candidate, palliative care was consulted and he was discharged home with outpatient palliative care for his wife's birthday.  Yesterday developed worsening dyspnea which acutely deteriorated In the ED he was hypertensive, hypoxic, placed on BiPAP, labs noted creatinine 3.4, troponin 374, BNP 837, chest x-ray with worsening edema and pleural effusions, admitted placed on diuretics and BiPAP  Subjective: -Feels better this morning, still somewhat short of breath  Assessment and Plan:  Infective endocarditis of mitral valve, large vegetation Severe mitral regurgitation H/o MSSA mitral valve endocarditis. TEE 8/25 noted 1.8 x 1.2 cm vegetation on mitral valve, perforation of the mitral valve, treated with 6 weeks of IV cefazolin  to complete therapy on 10/1 -Repeat TEE last week noted larger vegetation, perforation, severe MR -Appreciate reeval by ID, recommended to DC antibiotics as previously planned on 10/1, -Seen by Dr. Kerrin in August, felt to be a poor surgical candidate then, T CTS reconsulted, Dr. Shyrl reviewed case and felt he was a poor surgical candidate - Palliative consulted for goals of care,, patient and family aware of poor prognosis, he was discharged home with palliative care, now they want hospice involved at discharge, will consult TOC   Acute on chronic diastolic CHF -Echo w EF 60 to 34%, 2.5  x 1.5 cm mobile vegetation attached to the atrial surface of the posterior leaflet of the mitral valve near the base of the leaflet. Positive mitral valve regurgitation.  - Worsened very quickly since discharge -Continue IV Lasix , increased dose to 60 mg twice daily, kidney functions also worse, GDMT limited by CKD, Jardiance  and Aldactone  on hold  AKI on CKD 3 A - Creatinine worse from recent discharge, likely cardiorenal, monitor with diuresis   Acute hypoxemic respiratory failure due to acute cardiogenic pulmonary edema.  - As above    Paroxysmal atrial fibrillation (HCC) Atrial flutter with variable block.  -Now in sinus rhythm, continue metoprolol , Eliquis , po amiodarone   CAD, CABG No chest pain, elevation high sensitive troponin due to tachycardia and heart failure -continue statin, DOAC   AAA (abdominal aortic aneurysm) Follow up as outpatient    Obesity (BMI 30.0-34.9) Calculated BMI is 33.2 consistent in obesity class 1     DVT prophylaxis: Code Status:  Family Communication: Disposition Plan:   Consultants:    Procedures:   Antimicrobials:    Objective: Vitals:   07/31/24 0845 07/31/24 0900 07/31/24 0915 07/31/24 0930  BP: 139/77 136/86 137/80 139/78  Pulse:      Resp: (!) 23 (!) 23 (!) 25 (!) 23  Temp:      TempSrc:      SpO2: 95% 96% 95% 94%   No intake or output data in the 24 hours ending 07/31/24 1007 There were no vitals filed for this visit.  Examination:  General exam: Appears calm and comfortable  HEENT: Positive JVD Respiratory system: Decreased breath sounds at the bases Cardiovascular system: S1 & S2 heard, RRR.  Systolic murmur Abd: nondistended, soft and nontender.Normal bowel sounds heard. Central nervous system: Alert and oriented. No focal neurological deficits. Extremities: no edema Skin: No rashes Psychiatry:  Mood & affect appropriate.     Data Reviewed:   CBC: Recent Labs  Lab 07/25/24 0152 07/30/24 1842  07/30/24 1900 07/31/24 0512  WBC 7.7 10.3  --  6.4  NEUTROABS  --  3.8  --   --   HGB 12.5* 13.5 13.3  13.6 11.3*  HCT 36.3* 40.7 39.0  40.0 33.3*  MCV 93.1 97.1  --  95.4  PLT 81* 162  --  110*   Basic Metabolic Panel: Recent Labs  Lab 07/25/24 0545 07/26/24 0614 07/27/24 0525 07/28/24 0435 07/30/24 1842 07/30/24 1900 07/31/24 0512  NA  --  136 135 135 133* 133*  133* 135  K  --  3.8 4.1 4.4 3.8 3.7  3.7 4.0  CL  --  97* 96* 101 95* 97* 98  CO2  --  26 26 25 22   --  25  GLUCOSE  --  112* 98 110* 252* 260* 105*  BUN  --  32* 38* 38* 44* 43* 44*  CREATININE  --  1.54* 1.69* 1.59* 2.39* 2.40* 2.24*  CALCIUM   --  8.8* 8.8* 8.8* 8.9  --  8.4*  MG 2.2  --   --   --   --   --  2.3   GFR: Estimated Creatinine Clearance: 28.2 mL/min (A) (by C-G formula based on SCr of 2.24 mg/dL (H)). Liver Function Tests: Recent Labs  Lab 07/30/24 1842  AST 49*  ALT 5  ALKPHOS 51  BILITOT 0.9  PROT 6.5  ALBUMIN 3.8   No results for input(s): LIPASE, AMYLASE in the last 168 hours. No results for input(s): AMMONIA in the last 168 hours. Coagulation Profile: No results for input(s): INR, PROTIME in the last 168 hours. Cardiac Enzymes: No results for input(s): CKTOTAL, CKMB, CKMBINDEX, TROPONINI in the last 168 hours. BNP (last 3 results) Recent Labs    07/21/24 1106  PROBNP 2,281.0*   HbA1C: No results for input(s): HGBA1C in the last 72 hours. CBG: No results for input(s): GLUCAP in the last 168 hours. Lipid Profile: No results for input(s): CHOL, HDL, LDLCALC, TRIG, CHOLHDL, LDLDIRECT in the last 72 hours. Thyroid  Function Tests: No results for input(s): TSH, T4TOTAL, FREET4, T3FREE, THYROIDAB in the last 72 hours. Anemia Panel: No results for input(s): VITAMINB12, FOLATE, FERRITIN, TIBC, IRON, RETICCTPCT in the last 72 hours. Urine analysis:    Component Value Date/Time   COLORURINE YELLOW 10/06/2021 1523    APPEARANCEUR CLEAR 10/06/2021 1523   LABSPEC 1.010 10/06/2021 1523   PHURINE 7.0 10/06/2021 1523   GLUCOSEU NEGATIVE 10/06/2021 1523   GLUCOSEU NEGATIVE 04/24/2020 0847   HGBUR NEGATIVE 10/06/2021 1523   HGBUR negative 11/10/2008 1322   BILIRUBINUR NEGATIVE 10/06/2021 1523   KETONESUR NEGATIVE 10/06/2021 1523   PROTEINUR NEGATIVE 10/06/2021 1523   UROBILINOGEN 0.2 04/24/2020 0847   NITRITE NEGATIVE 10/06/2021 1523   LEUKOCYTESUR NEGATIVE 10/06/2021 1523   Sepsis Labs: @LABRCNTIP (procalcitonin:4,lacticidven:4)  ) Recent Results (from the past 240 hours)  Resp panel by RT-PCR (RSV, Flu A&B, Covid) Anterior Nasal Swab     Status: None   Collection Time: 07/21/24 11:06 AM   Specimen: Anterior Nasal Swab  Result Value Ref Range Status   SARS Coronavirus 2 by RT PCR NEGATIVE NEGATIVE Final    Comment: (NOTE) SARS-CoV-2 target nucleic acids are NOT DETECTED.  The SARS-CoV-2  RNA is generally detectable in upper respiratory specimens during the acute phase of infection. The lowest concentration of SARS-CoV-2 viral copies this assay can detect is 138 copies/mL. A negative result does not preclude SARS-Cov-2 infection and should not be used as the sole basis for treatment or other patient management decisions. A negative result may occur with  improper specimen collection/handling, submission of specimen other than nasopharyngeal swab, presence of viral mutation(s) within the areas targeted by this assay, and inadequate number of viral copies(<138 copies/mL). A negative result must be combined with clinical observations, patient history, and epidemiological information. The expected result is Negative.  Fact Sheet for Patients:  BloggerCourse.com  Fact Sheet for Healthcare Providers:  SeriousBroker.it  This test is no t yet approved or cleared by the [place]  FDA and  has been authorized for detection and/or diagnosis of  SARS-CoV-2 by FDA under an Emergency Use Authorization (EUA). This EUA will remain  in effect (meaning this test can be used) for the duration of the COVID-19 declaration under Section 564(b)(1) of the Act, 21 U.S.C.section 360bbb-3(b)(1), unless the authorization is terminated  or revoked sooner.       Influenza A by PCR NEGATIVE NEGATIVE Final   Influenza B by PCR NEGATIVE NEGATIVE Final    Comment: (NOTE) The Xpert Xpress SARS-CoV-2/FLU/RSV plus assay is intended as an aid in the diagnosis of influenza from Nasopharyngeal swab specimens and should not be used as a sole basis for treatment. Nasal washings and aspirates are unacceptable for Xpert Xpress SARS-CoV-2/FLU/RSV testing.  Fact Sheet for Patients: BloggerCourse.com  Fact Sheet for Healthcare Providers: SeriousBroker.it  This test is not yet approved or cleared by the [place]  FDA and has been authorized for detection and/or diagnosis of SARS-CoV-2 by FDA under an Emergency Use Authorization (EUA). This EUA will remain in effect (meaning this test can be used) for the duration of the COVID-19 declaration under Section 564(b)(1) of the Act, 21 U.S.C. section 360bbb-3(b)(1), unless the authorization is terminated or revoked.     Resp Syncytial Virus by PCR NEGATIVE NEGATIVE Final    Comment: (NOTE) Fact Sheet for Patients: BloggerCourse.com  Fact Sheet for Healthcare Providers: SeriousBroker.it  This test is not yet approved or cleared by the [place]  FDA and has been authorized for detection and/or diagnosis of SARS-CoV-2 by FDA under an Emergency Use Authorization (EUA). This EUA will remain in effect (meaning this test can be used) for the duration of the COVID-19 declaration under Section 564(b)(1) of the Act, 21 U.S.C. section 360bbb-3(b)(1), unless the authorization is terminated  or revoked.  Performed at [facility], [address] [place], [place] [address]   Blood culture (routine x 2)     Status: None (Preliminary result)   Collection Time: 07/30/24  8:42 PM   Specimen: BLOOD  Result Value Ref Range Status   Specimen Description BLOOD SITE NOT SPECIFIED  Final   Special Requests   Final    BOTTLES DRAWN AEROBIC ONLY Blood Culture results may not be optimal due to an inadequate volume of blood received in culture bottles   Culture   Final    NO GROWTH < 12 HOURS Performed at [facility], [address], [place], [place] [address]    Report Status PENDING  Incomplete     Radiology Studies: DG Chest Portable 1 View Result Date: 07/30/2024 EXAM: 1 VIEW(S) XRAY OF THE CHEST 07/30/2024 06:58:00 PM COMPARISON: 07/21/2024 CLINICAL HISTORY: SOB. Respiratory Distress FINDINGS: LINES,  TUBES AND DEVICES: Right upper extremity PICC in place with tip projecting over the SVC. LUNGS AND PLEURA: Worsening diffuse interstitial opacities. Layering bilateral pleural effusions. No pulmonary edema. No pneumothorax. HEART AND MEDIASTINUM: Cardiomegaly. Aortic atherosclerosis. CABG noted. BONES AND SOFT TISSUES: Nondisplaced median sternotomy wires. No acute osseous abnormality. IMPRESSION: 1. Worsening edema and  layering bilateral pleural effusions. Electronically signed by: Norman Gatlin MD 07/30/2024 07:27 PM EDT RP Workstation: [id number]     Scheduled Meds:  amiodarone   400 mg Oral BID   Followed by   NOREEN ON 08/04/2024] amiodarone   200 mg Oral BID   apixaban   5 mg Oral BID   atorvastatin   40 mg Oral Daily   furosemide   40 mg Intravenous Q12H   metoprolol  tartrate  50 mg Oral BID   sodium chloride  flush  3 mL Intravenous Q12H   Continuous Infusions:   ceFAZolin  (ANCEF ) IV 2 g (07/31/24 0935)     LOS: 1 day    Time spent:    [name] Pac, MD [facility]   07/31/2024, 10:07 AM

## 2024-07-31 NOTE — Progress Notes (Signed)
 Pt transported from ED 2 to 2C 6 without complications.

## 2024-07-31 NOTE — Progress Notes (Signed)
 RT arrived bedside to check bipap and patient was off bipap with no signs of distress. RT will continue to monitor as needed.

## 2024-07-31 NOTE — Plan of Care (Signed)
  Problem: Education: Goal: Knowledge of General Education information will improve Description: Including pain rating scale, medication(s)/side effects and non-pharmacologic comfort measures Outcome: Progressing   Problem: Health Behavior/Discharge Planning: Goal: Ability to manage health-related needs will improve Outcome: Progressing   Problem: Clinical Measurements: Goal: Respiratory complications will improve Outcome: Progressing   

## 2024-07-31 NOTE — ED Notes (Signed)
 Patient bladder scanned with a result of >226. MD made aware.

## 2024-08-01 ENCOUNTER — Ambulatory Visit: Admitting: Internal Medicine

## 2024-08-01 DIAGNOSIS — I330 Acute and subacute infective endocarditis: Secondary | ICD-10-CM | POA: Diagnosis not present

## 2024-08-01 DIAGNOSIS — J9601 Acute respiratory failure with hypoxia: Secondary | ICD-10-CM

## 2024-08-01 DIAGNOSIS — I2510 Atherosclerotic heart disease of native coronary artery without angina pectoris: Secondary | ICD-10-CM

## 2024-08-01 DIAGNOSIS — I480 Paroxysmal atrial fibrillation: Secondary | ICD-10-CM

## 2024-08-01 DIAGNOSIS — N189 Chronic kidney disease, unspecified: Secondary | ICD-10-CM

## 2024-08-01 DIAGNOSIS — I5033 Acute on chronic diastolic (congestive) heart failure: Secondary | ICD-10-CM | POA: Diagnosis not present

## 2024-08-01 DIAGNOSIS — I10 Essential (primary) hypertension: Secondary | ICD-10-CM

## 2024-08-01 DIAGNOSIS — B9561 Methicillin susceptible Staphylococcus aureus infection as the cause of diseases classified elsewhere: Secondary | ICD-10-CM

## 2024-08-01 DIAGNOSIS — N179 Acute kidney failure, unspecified: Secondary | ICD-10-CM

## 2024-08-01 LAB — BASIC METABOLIC PANEL WITH GFR
Anion gap: 11 (ref 5–15)
BUN: 42 mg/dL — ABNORMAL HIGH (ref 8–23)
CO2: 27 mmol/L (ref 22–32)
Calcium: 8.3 mg/dL — ABNORMAL LOW (ref 8.9–10.3)
Chloride: 98 mmol/L (ref 98–111)
Creatinine, Ser: 2.21 mg/dL — ABNORMAL HIGH (ref 0.61–1.24)
GFR, Estimated: 29 mL/min — ABNORMAL LOW (ref >60)
Glucose, Bld: 93 mg/dL (ref 70–99)
Potassium: 3.4 mmol/L — ABNORMAL LOW (ref 3.5–5.1)
Sodium: 136 mmol/L (ref 135–145)

## 2024-08-01 LAB — MRSA NEXT GEN BY PCR, NASAL: MRSA by PCR Next Gen: NOT DETECTED

## 2024-08-01 MED ORDER — SODIUM CHLORIDE 0.9% FLUSH: 10.0000 mL | INTRAVENOUS | Status: DC | PRN

## 2024-08-01 MED ORDER — POTASSIUM CHLORIDE CRYS ER 20 MEQ PO TBCR
40.0000 meq | EXTENDED_RELEASE_TABLET | Freq: Once | ORAL | Status: AC
  Administered 2024-08-01: 40 meq via ORAL
  Filled 2024-08-01: qty 2

## 2024-08-01 MED ORDER — CHLORHEXIDINE GLUCONATE CLOTH 2 % EX PADS
6.0000 | MEDICATED_PAD | Freq: Every day | CUTANEOUS | Status: DC
Start: 2024-08-01 — End: 2024-08-04
  Administered 2024-08-01 – 2024-08-04 (×4): 6 via TOPICAL

## 2024-08-01 MED ORDER — SODIUM CHLORIDE 0.9% FLUSH
10.0000 mL | Freq: Two times a day (BID) | INTRAVENOUS | Status: DC
  Administered 2024-08-01 – 2024-08-04 (×7): 10 mL

## 2024-08-01 MED ORDER — APIXABAN 2.5 MG PO TABS
2.5000 mg | ORAL_TABLET | Freq: Two times a day (BID) | ORAL | Status: DC
  Administered 2024-08-01 – 2024-08-04 (×6): 2.5 mg via ORAL
  Filled 2024-08-01 (×6): qty 1

## 2024-08-01 NOTE — TOC Progression Note (Signed)
 Transition of Care [facility]) - Progression Note    Patient Details  Name: Philip [name] MRN: [id number] Date of Birth: 04/06/1941  Transition of Care [facility]) CM/SW Contact  Roxie KANDICE Stain, RN Phone Number: 08/01/2024, 9:55 AM  Clinical Narrative:    Left wife, Dickey, [place] in regards to hospice choice. Awaiting return call.    Expected Discharge Plan: Home w Hospice Care Barriers to Discharge: Continued Medical Work up               Expected Discharge Plan and Services   Discharge Planning Services: CM Consult Post Acute Care Choice: Hospice Living arrangements for the past 2 months: Single Family Home                                       Social Drivers of Health (SDOH) Interventions SDOH Screenings   Food Insecurity: No Food Insecurity (07/31/2024)  Housing: Low Risk  (07/31/2024)  Transportation Needs: No Transportation Needs (07/31/2024)  Utilities: Not At Risk (07/31/2024)  Alcohol Screen: Low Risk  (05/15/2024)  Depression (PHQ2-9): Low Risk  (07/12/2024)  Recent Concern: Depression (PHQ2-9) - Medium Risk (07/05/2024)  Financial Resource Strain: Low Risk  (05/15/2024)  Physical Activity: Sufficiently Active (05/15/2024)  Social Connections: Moderately Integrated (07/31/2024)  Stress: No Stress Concern Present (05/15/2024)  Tobacco Use: Medium Risk (07/31/2024)    Readmission Risk Interventions    07/23/2024   11:00 AM 06/18/2024    3:45 PM  Readmission Risk Prevention Plan  Transportation Screening Complete Complete  PCP or Specialist Appt within 5-7 Days Complete   Home Care Screening Complete Complete  Medication Review (RN CM) Complete Complete

## 2024-08-01 NOTE — Progress Notes (Signed)
  This pt was referred for hospice care. I have have been able to connect with the wife Dickey who reports that they are receptive to hospice services at home. She does seem to be somewhat reserved about his illness and his needs. I called [employer] to determine the Liter flow on the oxygen he has at home. He only has a 5 Lobbyist at home. I have ordered this to be picked up and a 10L oxygen concentrator to be delivered to try and meet with his new oxygen needs are at this time.  She was offered additional equipment and did not feel they would need it at this time. If this changes we will be happy to add to the orders so that we can accommodate a safe d/c plan.   He has a C-pap and walker at home which he owns.   My MD has reviewed and he has been approved for hospice care at home with diagnosis of severe Mitral regurgitation.   Magdalena Berber RN [phone]

## 2024-08-01 NOTE — Plan of Care (Signed)
  Problem: Education: Goal: Knowledge of General Education information will improve Description: Including pain rating scale, medication(s)/side effects and non-pharmacologic comfort measures Outcome: Progressing   Problem: Health Behavior/Discharge Planning: Goal: Ability to manage health-related needs will improve Outcome: Progressing   Problem: Clinical Measurements: Goal: Respiratory complications will improve Outcome: Progressing   

## 2024-08-01 NOTE — Plan of Care (Signed)
 Discussed with patient plan of care for the evening, pain management, cardiac care and medications with some teach back displayed.  Problem: Education: Goal: Knowledge of General Education information will improve Description: Including pain rating scale, medication(s)/side effects and non-pharmacologic comfort measures Outcome: Progressing   Problem: Pain Managment: Goal: General experience of comfort will improve and/or be controlled Outcome: Progressing   Problem: Activity: Goal: Capacity to carry out activities will improve Outcome: Progressing

## 2024-08-01 NOTE — Progress Notes (Signed)
 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  Repeat TEE last week noting larger vegetation, perforation, severe MR.  Felt to be a poor surgical candidate by CT surgery.  Per ID, no reason to extend/continue cefazolin  past 10/1.  Palliative care on board.  Likely DC with hospice.  Demise volume and O2.  Acute exacerbation of chronic HFpEF/severe MR - Volume overloaded on presentation.  Responding well to IV Lasix , 60 mg twice daily.  Cardiology following closely.  Metoprolol  on board.  Spironolactone /Jardiance  on hold for now.  Paroxysmal atrial fibrillation - Continues on amiodarone , metoprolol .  Rate stable.  Acute hypoxic respiratory failure - Currently on 7-8 L nasal cannula.  Requiring BiPAP overnight.  Continue to attempt to wean O2 as tolerated.  Secondary to pulmonary edema.  CAD/CABG - No chest pain.  Initial troponin elevation likely demand from tachycardia/CHF.  Continue Eliquis , statin.  Goals of care - Severe MR, endocarditis.  Not felt to be a good surgical candidate.  Likely to be discharged home with palliative care/hospice care.  Goal to optimize patient volume status and O2 prior to DC.  Will work closely with patient, case management, on disposition planning.  Subjective: Patient resting comfortably this morning.  BiPAP overnight helped his  dyspnea.  Currently on 7-8 L nasal cannula.  Denies any chest pain, purulent sputum, cough, nausea, vomiting, abdominal pain.  Physical Exam:  Vitals:   08/01/24 0259 08/01/24 0300 08/01/24 0359 08/01/24 0811  BP:  137/76  137/69  Pulse:  (!) 59  73  Resp:  18  20  Temp:  97.9 F (36.6 C)  98.2 F (36.8 C)  TempSrc:  Axillary  Oral  SpO2: 96% 95%  92%  Weight:   92.1 kg   Height:        GENERAL:  Alert, pleasant, no acute distress  HEENT:  EOMI, nasal cannula CARDIOVASCULAR:  RRR, murmur appreciated RESPIRATORY:  Clear to auscultation, no wheezing, rales, or rhonchi GASTROINTESTINAL:  Soft, nontender, nondistended EXTREMITIES:  No LE edema bilaterally NEURO:  No new focal deficits appreciated SKIN:  No rashes noted PSYCH:  Appropriate mood and affect     Data Reviewed:  Imaging Studies: DG Chest Portable 1 View Result Date: 07/30/2024 EXAM: 1 VIEW(S) XRAY OF THE CHEST 07/30/2024 06:58:00 PM COMPARISON: 07/21/2024 CLINICAL HISTORY: SOB. Respiratory Distress FINDINGS: LINES, TUBES AND DEVICES: Right upper extremity PICC in place with tip projecting over the SVC. LUNGS AND PLEURA: Worsening diffuse interstitial opacities. Layering bilateral pleural effusions. No pulmonary edema. No pneumothorax. HEART AND MEDIASTINUM: Cardiomegaly. Aortic atherosclerosis. CABG noted. BONES AND SOFT TISSUES: Nondisplaced median sternotomy wires. No acute osseous abnormality. IMPRESSION: 1. Worsening edema and  layering bilateral pleural effusions. Electronically signed by: Norman Gatlin MD 07/30/2024 07:27 PM EDT RP Workstation: [id number]   ECHO TEE Result Date: 07/25/2024    TRANSESOPHOGEAL ECHO REPORT   Patient Name:  Philip [name] Philip Date of Exam: 07/25/2024 Medical Rec #:  [id number]      Height:       69.0 in Accession #:    [id number]     Weight:       215.3 lb Date of Birth:  04/17/1941      BSA:          2.132 m Patient Age:    83 years       BP:           136/89 mmHg Patient Gender: M               HR:           103 bpm. Exam Location:  Inpatient Procedure: Transesophageal Echo, Cardiac Doppler and Color Doppler (Both            Spectral and Color Flow Doppler were utilized during procedure). Indications:     Endocarditis  History:         Patient has prior history of Echocardiogram examinations, most                  recent 07/22/2024. CHF, CAD and Previous Myocardial Infarction;                  Risk Factors:Hypertension and Sleep Apnea.  Sonographer:     Jayson Gaskins Referring Phys:  [phone] MANUELITA B ROBERTS Diagnosing Phys: [name] MD PROCEDURE: After discussion of the risks and benefits of a TEE, an informed consent was obtained from the patient. The transesophogeal probe was passed without difficulty through the esophogus of the patient. Sedation performed by different physician. The patient was monitored while under deep sedation. Anesthestetic sedation was provided intravenously by Anesthesiology: 200mg  of Propofol . Image quality was excellent. The patient developed no complications during the procedure.  IMPRESSIONS  1. Large vegetations noted on MV with probable leaflet perforation; severe eccentric MR.  2. Left ventricular ejection fraction, by estimation, is 55 to 60%. The left ventricle has normal function. The left ventricle has no regional wall motion abnormalities.  3. Right ventricular systolic function is normal. The right ventricular size is normal.  4. Left atrial size was severely dilated. No left atrial/left atrial appendage thrombus was detected.  5. The mitral valve is abnormal. Severe mitral valve regurgitation.  6. The aortic valve is tricuspid. Aortic valve regurgitation is not visualized. Aortic valve sclerosis/calcification is present, without any evidence of aortic stenosis.  7. There is Moderate (Grade III) plaque involving the descending aorta.  8. 3D performed of the mitral valve. Conclusion(s)/Recommendation(s): Findings are concerning for vegetation/infective  endocarditis as detailed above. Findings concerning for mitral valve vegetation. FINDINGS  Left Ventricle: Left ventricular ejection fraction, by estimation, is 55 to 60%. The left ventricle has normal function. The left ventricle has no regional wall motion abnormalities. The left ventricular internal cavity size was normal in size. There is  no left ventricular hypertrophy. Right Ventricle: The right ventricular size is normal. Right ventricular systolic function is normal. Left Atrium: Left atrial size was severely dilated. No left atrial/left atrial appendage thrombus was detected. Right Atrium: Right atrial size was normal in size. Pericardium: There is no evidence of pericardial effusion. Mitral Valve: The mitral valve is abnormal. Severe mitral valve regurgitation. Tricuspid Valve: The tricuspid valve is normal in structure. Tricuspid valve regurgitation is mild. Aortic Valve: The aortic valve is tricuspid. Aortic valve regurgitation is not visualized. Aortic valve sclerosis/calcification is present, without any evidence of aortic  stenosis. Pulmonic Valve: The pulmonic valve was normal in structure. Pulmonic valve regurgitation is trivial. Aorta: The aortic root is normal in size and structure. There is moderate (Grade III) plaque involving the descending aorta. IAS/Shunts: No atrial level shunt detected by color flow Doppler. Additional Comments: Large vegetations noted on MV with probable leaflet perforation; severe eccentric MR. 3D was performed not requiring image post processing on an independent workstation and was abnormal. [name] MD Electronically signed by [name] MD Signature Date/Time: 07/25/2024/10:20:46 AM    Final    EP STUDY Result Date: 07/25/2024 See surgical note for result.  ECHOCARDIOGRAM LIMITED Result Date: 07/22/2024    ECHOCARDIOGRAM LIMITED REPORT   Patient Name:   NAMON VILLARIN Hemberger Date of Exam: 07/22/2024 Medical Rec #:  [id number]      Height:       69.0 in Accession  #:    [id number]     Weight:       224.9 lb Date of Birth:  [birth year]      BSA:          2.172 m Patient Age:    83 years       BP:           139/93 mmHg Patient Gender: M              HR:           144 bpm. Exam Location:  Inpatient Procedure: Color Doppler and Limited Echo (Both Spectral and Color Flow Doppler            were utilized during procedure). Indications:    Mitral Valve Disorder  History:        Patient has prior history of Echocardiogram examinations, most                 recent 06/24/2024.  Sonographer:    Tinnie Gosling RDCS Referring Phys: [id number] MAURICIO DANIEL ARRIEN IMPRESSIONS  1. Left ventricular ejection fraction, by estimation, is 60 to 65%. The left ventricle has normal function. Left ventricular endocardial border not optimally defined to evaluate regional wall motion.  2. Left atrial size was mildly dilated.  3. 2.5 x 1.5 cm mobile vegetation attached to the atrial surface of the posterior leaflet of the mitral valve near the base of the leaflet. It prolapses across the leaflet. Mitral regurgitation is present but is not fully interrogated. Moderate mitral annular calcification.  4. There is mild calcification of the aortic valve.  5. The inferior vena cava is normal in size with greater than 50% respiratory variability, suggesting right atrial pressure of 3 mmHg.  6. Very limited echo. There is still a large mitral valve vegetation. The mitral regurgitation was not fully interrogated. The RV was not visualized. The aortic valve was not visualized. Would consider full echo or repeat TEE. FINDINGS  Left Ventricle: Left ventricular ejection fraction, by estimation, is 60 to 65%. The left ventricle has normal function. Left ventricular endocardial border not optimally defined to evaluate regional wall motion. The left ventricular internal cavity size was normal in size. There is no left ventricular hypertrophy. Left Atrium: Left atrial size was mildly dilated. Mitral Valve: 2.5 x 1.5 cm  mobile vegetation attached to the atrial surface of the posterior leaflet of the mitral valve near the base of the leaflet. It prolapses across the leaflet. Mitral regurgitation is present but is not fully interrogated. Moderate mitral annular calcification. Aortic Valve: There is mild calcification of the aortic valve. Venous:  The inferior vena cava is normal in size with greater than 50% respiratory variability, suggesting right atrial pressure of 3 mmHg. IVC IVC diam: 1.40 cm Dalton McleanMD Electronically signed by Ezra Kanner Signature Date/Time: 07/22/2024/9:28:59 PM    Final    DG Chest Portable 1 View Result Date: 07/21/2024 CLINICAL DATA:  Shortness of breath. EXAM: PORTABLE CHEST 1 VIEW COMPARISON:  06/19/2024 FINDINGS: Cardiopericardial silhouette is at upper limits of normal for size. Diffuse interstitial and basilar airspace disease suggests edema. No substantial pleural effusion. Right PICC line tip overlies the right innominate vein. Telemetry leads overlie the chest. IMPRESSION: Diffuse interstitial and basilar airspace disease suggests edema. Electronically Signed   By: Camellia Candle M.D.   On: 07/21/2024 11:49   US  EKG SITE RITE Result Date: 07/14/2024 If Site Rite image not attached, placement could not be confirmed due to current cardiac rhythm.   There are no new results to review at this time.  Previous records (including but not limited to H&P, progress notes, nursing notes, TOC management) were reviewed in assessment of this patient.  Labs: CBC: Recent Labs  Lab 07/30/24 1842 07/30/24 1900 07/31/24 0512  WBC 10.3  --  6.4  NEUTROABS 3.8  --   --   HGB 13.5 13.3  13.6 11.3*  HCT 40.7 39.0  40.0 33.3*  MCV 97.1  --  95.4  PLT 162  --  110*   Basic Metabolic Panel: Recent Labs  Lab 07/27/24 0525 07/28/24 0435 07/30/24 1842 07/30/24 1900 07/31/24 0512 08/01/24 0400  NA 135 135 133* 133*  133* 135 136  K 4.1 4.4 3.8 3.7  3.7 4.0 3.4*  CL 96* 101 95* 97*  98 98  CO2 26 25 22   --  25 27  GLUCOSE 98 110* 252* 260* 105* 93  BUN 38* 38* 44* 43* 44* 42*  CREATININE 1.69* 1.59* 2.39* 2.40* 2.24* 2.21*  CALCIUM  8.8* 8.8* 8.9  --  8.4* 8.3*  MG  --   --   --   --  2.3  --    Liver Function Tests: Recent Labs  Lab 07/30/24 1842  AST 49*  ALT 5  ALKPHOS 51  BILITOT 0.9  PROT 6.5  ALBUMIN 3.8   CBG: No results for input(s): GLUCAP in the last 168 hours.  Scheduled Meds:  amiodarone   400 mg Oral BID   Followed by   NOREEN ON 08/04/2024] amiodarone   200 mg Oral BID   apixaban   2.5 mg Oral BID   atorvastatin   40 mg Oral Daily   Chlorhexidine  Gluconate Cloth  6 each Topical Daily   furosemide   60 mg Intravenous Q12H   metoprolol  tartrate  50 mg Oral BID   sodium chloride  flush  10-40 mL Intracatheter Q12H   sodium chloride  flush  3 mL Intravenous Q12H   Continuous Infusions:   ceFAZolin  (ANCEF ) IV 2 g (08/01/24 0847)   PRN Meds:.acetaminophen  **OR** acetaminophen , ondansetron  **OR** ondansetron  (ZOFRAN ) IV, senna-docusate, sodium chloride  flush  Family Communication: None at bedside  Disposition: Status is: Inpatient Remains inpatient appropriate because: Endocarditis, severe MR     Time spent: 50 minutes  Length of inpatient stay: 2 days  Author: Carliss LELON Canales, DO 08/01/2024 11:36 AM  For on call review [email or web address].

## 2024-08-01 NOTE — TOC Progression Note (Signed)
 Transition of Care [facility]) - Progression Note    Patient Details  Name: Philip [name] MRN: [id number] Date of Birth: [birth year]  Transition of [facility]) CM/SW Contact  Roxie KANDICE Stain, RN Phone Number: 08/01/2024, 1:53 PM  Clinical Narrative:    Spoke to patient and wife at bedside in regards to home hospice. Their choice is [facility]. Cheri with [facility] has accepted referral.   Expected Discharge Plan: Home w Hospice Care Barriers to Discharge: Continued Medical Work up               Expected Discharge Plan and Services   Discharge Planning Services: CM Consult Post Acute Care Choice: Hospice Living arrangements for the past 2 months: Single Family Home                                       Social Drivers of Health (SDOH) Interventions SDOH Screenings   Food Insecurity: No Food Insecurity (07/31/2024)  Housing: Low Risk  (07/31/2024)  Transportation Needs: No Transportation Needs (07/31/2024)  Utilities: Not At Risk (07/31/2024)  Alcohol Screen: Low Risk  (05/15/2024)  Depression (PHQ2-9): Low Risk  (07/12/2024)  Recent Concern: Depression (PHQ2-9) - Medium Risk (07/05/2024)  Financial Resource Strain: Low Risk  (05/15/2024)  Physical Activity: Sufficiently Active (05/15/2024)  Social Connections: Moderately Integrated (07/31/2024)  Stress: No Stress Concern Present (05/15/2024)  Tobacco Use: Medium Risk (07/31/2024)    Readmission Risk Interventions    07/23/2024   11:00 AM 06/18/2024    3:45 PM  Readmission Risk Prevention Plan  Transportation Screening Complete Complete  PCP or Specialist Appt within 5-7 Days Complete   Home Care Screening Complete Complete  Medication Review (RN CM) Complete Complete

## 2024-08-01 NOTE — Hospital Course (Addendum)
 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  Repeat TEE last week noting larger vegetation, perforation, severe MR.  Felt to be a poor surgical candidate by CT surgery.  Per ID, no reason to extend/continue cefazolin  past 10/1.  Palliative care on board.  Likely DC with hospice.  Continue to optimize volume and O2.   Acute hypoxic respiratory failure - Currently on 7 L nasal cannula, slightly worse than yesterday.  Continue to attempt to wean O2 as tolerated.  Secondary to pulmonary edema.   Acute exacerbation of chronic HFpEF/severe MR - Volume overloaded on presentation.  Responding well to IV Lasix , 60 mg twice daily.  Negative nearly 5 L since presentation.  Metoprolol  on board.  Spironolactone /Jardiance  on hold for now.   Paroxysmal atrial fibrillation - Continues on amiodarone , metoprolol .  Rate stable.    CAD/CABG - No chest pain.  Initial troponin elevation likely demand from tachycardia/CHF.  Continue Eliquis , statin.   Goals of care - Severe MR, endocarditis.  Not felt to be a good surgical candidate.  Goal to optimize patient volume status and O2 prior to DC.  Will work closely with patient, case management, on disposition planning.  Likely to be discharged home with palliative care/hospice care.

## 2024-08-02 DIAGNOSIS — I5033 Acute on chronic diastolic (congestive) heart failure: Secondary | ICD-10-CM | POA: Diagnosis not present

## 2024-08-02 DIAGNOSIS — J9601 Acute respiratory failure with hypoxia: Secondary | ICD-10-CM | POA: Diagnosis not present

## 2024-08-02 DIAGNOSIS — I330 Acute and subacute infective endocarditis: Secondary | ICD-10-CM | POA: Diagnosis not present

## 2024-08-02 DIAGNOSIS — N179 Acute kidney failure, unspecified: Secondary | ICD-10-CM | POA: Diagnosis not present

## 2024-08-02 LAB — COMPREHENSIVE METABOLIC PANEL WITH GFR
ALT: 5 U/L (ref 0–44)
AST: 35 U/L (ref 15–41)
Albumin: 3.3 g/dL — ABNORMAL LOW (ref 3.5–5.0)
Alkaline Phosphatase: 38 U/L (ref 38–126)
Anion gap: 11 (ref 5–15)
BUN: 37 mg/dL — ABNORMAL HIGH (ref 8–23)
CO2: 28 mmol/L (ref 22–32)
Calcium: 8.6 mg/dL — ABNORMAL LOW (ref 8.9–10.3)
Chloride: 99 mmol/L (ref 98–111)
Creatinine, Ser: 2.01 mg/dL — ABNORMAL HIGH (ref 0.61–1.24)
GFR, Estimated: 32 mL/min — ABNORMAL LOW (ref >60)
Glucose, Bld: 92 mg/dL (ref 70–99)
Potassium: 3.3 mmol/L — ABNORMAL LOW (ref 3.5–5.1)
Sodium: 138 mmol/L (ref 135–145)
Total Bilirubin: 1.2 mg/dL (ref 0.0–1.2)
Total Protein: 5.8 g/dL — ABNORMAL LOW (ref 6.5–8.1)

## 2024-08-02 LAB — CBC
HCT: 33.9 % — ABNORMAL LOW (ref 39.0–52.0)
Hemoglobin: 11.4 g/dL — ABNORMAL LOW (ref 13.0–17.0)
MCH: 32 pg (ref 26.0–34.0)
MCHC: 33.6 g/dL (ref 30.0–36.0)
MCV: 95.2 fL (ref 80.0–100.0)
Platelets: 117 K/uL — ABNORMAL LOW (ref 150–400)
RBC: 3.56 MIL/uL — ABNORMAL LOW (ref 4.22–5.81)
RDW: 13.3 % (ref 11.5–15.5)
WBC: 4.8 K/uL (ref 4.0–10.5)
nRBC: 0 % (ref 0.0–0.2)

## 2024-08-02 LAB — MAGNESIUM: Magnesium: 2.3 mg/dL (ref 1.7–2.4)

## 2024-08-02 NOTE — Progress Notes (Signed)
 Mobility Specialist Progress Note;   08/02/24 1051  Mobility  Activity Ambulated with assistance  Level of Assistance Minimal assist, patient does 75% or more  Assistive Device None  Distance Ambulated (ft) 150 ft  Activity Response Tolerated well  Mobility Referral Yes  Mobility visit 1 Mobility  Mobility Specialist Start Time (ACUTE ONLY) 1051  Mobility Specialist Stop Time (ACUTE ONLY) 1109  Mobility Specialist Time Calculation (min) (ACUTE ONLY) 18 min   RN requesting pt to ambulate, pt agreeable. On 4LO2 upon arrival. Required MinA during ambulation d/t unsteadiness, would benefit from use of AD. Able to ambulate on 4LO2, SPO2 92-95%. Dyspnea displayed once returning from BR and increased exertion. Pt returned back to chair and left with all needs met, alarm on. RN notified.   Lauraine Erm Mobility Specialist Please contact via SecureChat or [employer] [phone]

## 2024-08-02 NOTE — Care Management Important Message (Signed)
 Important Message  Patient Details  Name: Philip [name] MRN: [id number] Date of Birth: September 02, 1941   Important Message Given:  Yes - Medicare IM     [name] 08/02/2024, 2:40 PM

## 2024-08-02 NOTE — Progress Notes (Signed)
 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  Repeat TEE last week noting larger vegetation, perforation, severe MR.  Felt to be a poor surgical candidate by CT surgery.  Per ID, no reason to extend/continue cefazolin  past 10/1.  Palliative care on board.  Likely DC with hospice.  Demise volume and O2.   Acute hypoxic respiratory failure - Currently on 5-6 L nasal cannula, improved from yesterday.  Continue to attempt to wean O2 as tolerated.  Secondary to pulmonary edema.  Acute exacerbation of chronic HFpEF/severe MR - Volume overloaded on presentation.  Responding well to IV Lasix , 60 mg twice daily.  Cardiology following closely.  Metoprolol  on board.  Spironolactone /Jardiance  on hold for now.   Paroxysmal atrial fibrillation - Continues on amiodarone , metoprolol .  Rate stable.    CAD/CABG - No chest pain.  Initial troponin elevation likely demand from tachycardia/CHF.  Continue Eliquis , statin.   Goals of care - Severe MR, endocarditis.  Not felt to be a good surgical candidate.  Likely to be discharged home with palliative care/hospice care.  Goal to optimize patient volume status and O2 prior to DC.  Will work closely with patient, case management, on disposition planning.     Subjective: Patient resting the bedside chair.  Still short of breath as to be  expected but O2 improved from yesterday, now down to 5 L, satting 97%.  Diuresed well overnight, minus nearly 2 L.  Denies any worsening chest pain, chills, nausea, vomiting, abdominal pain.  Physical Exam:  Vitals:   08/02/24 0717 08/02/24 0855 08/02/24 0900 08/02/24 1036  BP: 116/77 106/75 106/75 125/72  Pulse: 64 (!) 59 (!) 58 61  Resp: 19  (!) 22 (!) 22  Temp: 97.9 F (36.6 C)   97.8 F (36.6 C)  TempSrc: Oral   Oral  SpO2: 100%  96% 98%  Weight:      Height:        GENERAL:  Alert, pleasant, no acute distress  HEENT:  EOMI, nasal cannula CARDIOVASCULAR:  RRR, murmur appreciated RESPIRATORY: Dyspneic, clear to auscultation, no wheezing, rales, or rhonchi GASTROINTESTINAL:  Soft, nontender, nondistended EXTREMITIES:  No LE edema bilaterally NEURO:  No new focal deficits appreciated SKIN:  No rashes noted PSYCH:  Appropriate mood and affect    Data Reviewed:  Imaging Studies: DG Chest Portable 1 View Result Date: 07/30/2024 EXAM: 1 VIEW(S) XRAY OF THE CHEST 07/30/2024 06:58:00 PM COMPARISON: 07/21/2024 CLINICAL HISTORY: SOB. Respiratory Distress FINDINGS: LINES, TUBES AND DEVICES: Right upper extremity PICC in place with tip projecting over the SVC. LUNGS AND PLEURA: Worsening diffuse interstitial opacities. Layering bilateral pleural effusions. No pulmonary edema. No pneumothorax. HEART AND MEDIASTINUM: Cardiomegaly. Aortic atherosclerosis. CABG noted. BONES AND SOFT TISSUES: Nondisplaced median sternotomy wires. No acute osseous abnormality. IMPRESSION: 1. Worsening edema and  layering bilateral pleural effusions. Electronically signed by: Norman Gatlin  MD 07/30/2024 07:27 PM EDT RP Workstation: [id number]   ECHO TEE Result Date: 07/25/2024    TRANSESOPHOGEAL ECHO REPORT   Patient Name:   Philip [name] Date of Exam: 07/25/2024 Medical Rec #:  [id number]      Height:       69.0 in Accession #:    [id number]     Weight:       215.3 lb Date of Birth:  10/23/1941      BSA:           2.132 m Patient Age:    83 years       BP:           136/89 mmHg Patient Gender: M              HR:           103 bpm. Exam Location:  Inpatient Procedure: Transesophageal Echo, Cardiac Doppler and Color Doppler (Both            Spectral and Color Flow Doppler were utilized during procedure). Indications:     Endocarditis  History:         Patient has prior history of Echocardiogram examinations, most                  recent 07/22/2024. CHF, CAD and Previous Myocardial Infarction;                  Risk Factors:Hypertension and Sleep Apnea.  Sonographer:     Jayson Gaskins Referring Phys:  [phone] MANUELITA B ROBERTS Diagnosing Phys: [name] MD PROCEDURE: After discussion of the risks and benefits of a TEE, an informed consent was obtained from the patient. The transesophogeal probe was passed without difficulty through the esophogus of the patient. Sedation performed by different physician. The patient was monitored while under deep sedation. Anesthestetic sedation was provided intravenously by Anesthesiology: 200mg  of Propofol . Image quality was excellent. The patient developed no complications during the procedure.  IMPRESSIONS  1. Large vegetations noted on MV with probable leaflet perforation; severe eccentric MR.  2. Left ventricular ejection fraction, by estimation, is 55 to 60%. The left ventricle has normal function. The left ventricle has no regional wall motion abnormalities.  3. Right ventricular systolic function is normal. The right ventricular size is normal.  4. Left atrial size was severely dilated. No left atrial/left atrial appendage thrombus was detected.  5. The mitral valve is abnormal. Severe mitral valve regurgitation.  6. The aortic valve is tricuspid. Aortic valve regurgitation is not visualized. Aortic valve sclerosis/calcification is present, without any evidence of aortic stenosis.  7. There is Moderate (Grade III) plaque involving the descending aorta.  8. 3D performed of the mitral  valve. Conclusion(s)/Recommendation(s): Findings are concerning for vegetation/infective endocarditis as detailed above. Findings concerning for mitral valve vegetation. FINDINGS  Left Ventricle: Left ventricular ejection fraction, by estimation, is 55 to 60%. The left ventricle has normal function. The left ventricle has no regional wall motion abnormalities. The left ventricular internal cavity size was normal in size. There is  no left ventricular hypertrophy. Right Ventricle: The right ventricular size is normal. Right ventricular systolic function is normal. Left Atrium: Left atrial size was severely dilated. No left atrial/left atrial appendage thrombus was detected. Right Atrium: Right atrial size was normal in size. Pericardium: There is no evidence of pericardial effusion. Mitral Valve: The mitral valve is abnormal. Severe mitral valve regurgitation. Tricuspid Valve: The tricuspid valve is normal in structure. Tricuspid  valve regurgitation is mild. Aortic Valve: The aortic valve is tricuspid. Aortic valve regurgitation is not visualized. Aortic valve sclerosis/calcification is present, without any evidence of aortic stenosis. Pulmonic Valve: The pulmonic valve was normal in structure. Pulmonic valve regurgitation is trivial. Aorta: The aortic root is normal in size and structure. There is moderate (Grade III) plaque involving the descending aorta. IAS/Shunts: No atrial level shunt detected by color flow Doppler. Additional Comments: Large vegetations noted on MV with probable leaflet perforation; severe eccentric MR. 3D was performed not requiring image post processing on an independent workstation and was abnormal. [name] MD Electronically signed by [name] MD Signature Date/Time: 07/25/2024/10:20:46 AM    Final    EP STUDY Result Date: 07/25/2024 See surgical note for result.  ECHOCARDIOGRAM LIMITED Result Date: 07/22/2024    ECHOCARDIOGRAM LIMITED REPORT   Patient Name:   ZIGMOND TRELA  Coryell Date of Exam: 07/22/2024 Medical Rec #:  [id number]      Height:       69.0 in Accession #:    [id number]     Weight:       224.9 lb Date of Birth:  1940/11/23      BSA:          2.172 m Patient Age:    83 years       BP:           139/93 mmHg Patient Gender: M              HR:           144 bpm. Exam Location:  Inpatient Procedure: Color Doppler and Limited Echo (Both Spectral and Color Flow Doppler            were utilized during procedure). Indications:    Mitral Valve Disorder  History:        Patient has prior history of Echocardiogram examinations, most                 recent 06/24/2024.  Sonographer:    Tinnie Gosling RDCS Referring Phys: [id number] MAURICIO DANIEL ARRIEN IMPRESSIONS  1. Left ventricular ejection fraction, by estimation, is 60 to 65%. The left ventricle has normal function. Left ventricular endocardial border not optimally defined to evaluate regional wall motion.  2. Left atrial size was mildly dilated.  3. 2.5 x 1.5 cm mobile vegetation attached to the atrial surface of the posterior leaflet of the mitral valve near the base of the leaflet. It prolapses across the leaflet. Mitral regurgitation is present but is not fully interrogated. Moderate mitral annular calcification.  4. There is mild calcification of the aortic valve.  5. The inferior vena cava is normal in size with greater than 50% respiratory variability, suggesting right atrial pressure of 3 mmHg.  6. Very limited echo. There is still a large mitral valve vegetation. The mitral regurgitation was not fully interrogated. The RV was not visualized. The aortic valve was not visualized. Would consider full echo or repeat TEE. FINDINGS  Left Ventricle: Left ventricular ejection fraction, by estimation, is 60 to 65%. The left ventricle has normal function. Left ventricular endocardial border not optimally defined to evaluate regional wall motion. The left ventricular internal cavity size was normal in size. There is no left ventricular  hypertrophy. Left Atrium: Left atrial size was mildly dilated. Mitral Valve: 2.5 x 1.5 cm mobile vegetation attached to the atrial surface of the posterior leaflet of the mitral valve near the base of the leaflet. It prolapses  across the leaflet. Mitral regurgitation is present but is not fully interrogated. Moderate mitral annular calcification. Aortic Valve: There is mild calcification of the aortic valve. Venous: The inferior vena cava is normal in size with greater than 50% respiratory variability, suggesting right atrial pressure of 3 mmHg. IVC IVC diam: 1.40 cm Dalton McleanMD Electronically signed by Ezra Kanner Signature Date/Time: 07/22/2024/9:28:59 PM    Final    DG Chest Portable 1 View Result Date: 07/21/2024 CLINICAL DATA:  Shortness of breath. EXAM: PORTABLE CHEST 1 VIEW COMPARISON:  06/19/2024 FINDINGS: Cardiopericardial silhouette is at upper limits of normal for size. Diffuse interstitial and basilar airspace disease suggests edema. No substantial pleural effusion. Right PICC line tip overlies the right innominate vein. Telemetry leads overlie the chest. IMPRESSION: Diffuse interstitial and basilar airspace disease suggests edema. Electronically Signed   By: Camellia Candle M.D.   On: 07/21/2024 11:49   US  EKG SITE RITE Result Date: 07/14/2024 If Site Rite image not attached, placement could not be confirmed due to current cardiac rhythm.   There are no new results to review at this time.  Previous records (including but not limited to H&P, progress notes, nursing notes, TOC management) were reviewed in assessment of this patient.  Labs: CBC: Recent Labs  Lab 07/30/24 1842 07/30/24 1900 07/31/24 0512 08/02/24 0556  WBC 10.3  --  6.4 4.8  NEUTROABS 3.8  --   --   --   HGB 13.5 13.3  13.6 11.3* 11.4*  HCT 40.7 39.0  40.0 33.3* 33.9*  MCV 97.1  --  95.4 95.2  PLT 162  --  110* 117*   Basic Metabolic Panel: Recent Labs  Lab 07/28/24 0435 07/30/24 1842 07/30/24 1900  07/31/24 0512 08/01/24 0400 08/02/24 0556  NA 135 133* 133*  133* 135 136 138  K 4.4 3.8 3.7  3.7 4.0 3.4* 3.3*  CL 101 95* 97* 98 98 99  CO2 25 22  --  25 27 28   GLUCOSE 110* 252* 260* 105* 93 92  BUN 38* 44* 43* 44* 42* 37*  CREATININE 1.59* 2.39* 2.40* 2.24* 2.21* 2.01*  CALCIUM  8.8* 8.9  --  8.4* 8.3* 8.6*  MG  --   --   --  2.3  --  2.3   Liver Function Tests: Recent Labs  Lab 07/30/24 1842 08/02/24 0556  AST 49* 35  ALT 5 5  ALKPHOS 51 38  BILITOT 0.9 1.2  PROT 6.5 5.8*  ALBUMIN 3.8 3.3*   CBG: No results for input(s): GLUCAP in the last 168 hours.  Scheduled Meds:  amiodarone   400 mg Oral BID   Followed by   NOREEN ON 08/04/2024] amiodarone   200 mg Oral BID   apixaban   2.5 mg Oral BID   atorvastatin   40 mg Oral Daily   Chlorhexidine  Gluconate Cloth  6 each Topical Daily   furosemide   60 mg Intravenous Q12H   metoprolol  tartrate  50 mg Oral BID   sodium chloride  flush  10-40 mL Intracatheter Q12H   sodium chloride  flush  3 mL Intravenous Q12H   Continuous Infusions: PRN Meds:.acetaminophen  **OR** acetaminophen , ondansetron  **OR** ondansetron  (ZOFRAN ) IV, senna-docusate, sodium chloride  flush  Family Communication: None at bedside  Disposition: Status is: Inpatient Remains inpatient appropriate because: Acute hypoxic respiratory failure     Time spent: 39 minutes  Length of inpatient stay: 3 days  Author: Carliss LELON Canales, DO 08/02/2024 10:42 AM  For on call review [email or web address].

## 2024-08-02 NOTE — Progress Notes (Signed)
 Patient had converted to A-flutter earlier this morning and strip saved by Telemetry Monitoring.  Staff was able to obtain a 12 Lead EKG in A-flutter and back A-fib in Epic and hard copy in his chart.

## 2024-08-03 DIAGNOSIS — I480 Paroxysmal atrial fibrillation: Secondary | ICD-10-CM | POA: Diagnosis not present

## 2024-08-03 DIAGNOSIS — I5033 Acute on chronic diastolic (congestive) heart failure: Secondary | ICD-10-CM | POA: Diagnosis not present

## 2024-08-03 DIAGNOSIS — I330 Acute and subacute infective endocarditis: Secondary | ICD-10-CM | POA: Diagnosis not present

## 2024-08-03 DIAGNOSIS — I340 Nonrheumatic mitral (valve) insufficiency: Secondary | ICD-10-CM

## 2024-08-03 DIAGNOSIS — G4733 Obstructive sleep apnea (adult) (pediatric): Secondary | ICD-10-CM

## 2024-08-03 DIAGNOSIS — I10 Essential (primary) hypertension: Secondary | ICD-10-CM | POA: Diagnosis not present

## 2024-08-03 LAB — BASIC METABOLIC PANEL WITH GFR
Anion gap: 14 (ref 5–15)
BUN: 35 mg/dL — ABNORMAL HIGH (ref 8–23)
CO2: 27 mmol/L (ref 22–32)
Calcium: 8.4 mg/dL — ABNORMAL LOW (ref 8.9–10.3)
Chloride: 97 mmol/L — ABNORMAL LOW (ref 98–111)
Creatinine, Ser: 2.09 mg/dL — ABNORMAL HIGH (ref 0.61–1.24)
GFR, Estimated: 31 mL/min — ABNORMAL LOW (ref >60)
Glucose, Bld: 100 mg/dL — ABNORMAL HIGH (ref 70–99)
Potassium: 3.2 mmol/L — ABNORMAL LOW (ref 3.5–5.1)
Sodium: 138 mmol/L (ref 135–145)

## 2024-08-03 LAB — CBC
HCT: 34.4 % — ABNORMAL LOW (ref 39.0–52.0)
Hemoglobin: 11.8 g/dL — ABNORMAL LOW (ref 13.0–17.0)
MCH: 32.2 pg (ref 26.0–34.0)
MCHC: 34.3 g/dL (ref 30.0–36.0)
MCV: 94 fL (ref 80.0–100.0)
Platelets: 119 K/uL — ABNORMAL LOW (ref 150–400)
RBC: 3.66 MIL/uL — ABNORMAL LOW (ref 4.22–5.81)
RDW: 13.4 % (ref 11.5–15.5)
WBC: 5 K/uL (ref 4.0–10.5)
nRBC: 0 % (ref 0.0–0.2)

## 2024-08-03 LAB — MAGNESIUM: Magnesium: 2.1 mg/dL (ref 1.7–2.4)

## 2024-08-03 MED ORDER — ALPRAZOLAM 0.25 MG PO TABS
0.2500 mg | ORAL_TABLET | Freq: Two times a day (BID) | ORAL | Status: DC | PRN
  Administered 2024-08-03 – 2024-08-04 (×2): 0.25 mg via ORAL
  Filled 2024-08-03 (×2): qty 1

## 2024-08-03 MED ORDER — POTASSIUM CHLORIDE 20 MEQ PO PACK
40.0000 meq | PACK | Freq: Two times a day (BID) | ORAL | Status: AC
  Administered 2024-08-03 (×2): 40 meq via ORAL
  Filled 2024-08-03 (×2): qty 2

## 2024-08-03 NOTE — Progress Notes (Addendum)
 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  Repeat TEE last week noting larger vegetation, perforation, severe MR.  Felt to be a poor surgical candidate by CT surgery.  Per ID, no reason to extend/continue cefazolin  past 10/1.  Palliative care on board.  Likely DC with hospice.  Continue to optimize volume and O2.   Acute hypoxic respiratory failure - Currently on 7 L nasal cannula, slightly worse than yesterday.  Continue to attempt to wean O2 as tolerated.  Secondary to pulmonary edema.   Acute exacerbation of chronic HFpEF/severe MR - Volume overloaded on presentation.  Responding well to IV Lasix , 60 mg twice daily.  Negative nearly 5 L since presentation.  Metoprolol  on board.  Spironolactone /Jardiance  on hold for now.   Paroxysmal atrial fibrillation - Continues on amiodarone , metoprolol .  Rate stable.    CAD/CABG - No chest pain.  Initial troponin elevation likely demand from tachycardia/CHF.  Continue Eliquis , statin.   Goals of care - Severe MR, endocarditis.  Not felt to be a good surgical candidate.  Goal to optimize patient volume status and O2 prior to DC.  Will work closely with patient, case management, on disposition planning.  Likely to be discharged home with palliative care/hospice care.     Subjective: Patient sitting in bedside chair,  comfortable.  Stated he had a rough night overnight, did not sleep well.  Stated there was some malfunction with his BiPAP.  Currently on 7 L nasal cannula slightly worse than yesterday.  Diuresing well, negative greater than 1 L since yesterday, nearly 5 L since presentation.  Denies any purulent sputum, fevers, worsening shortness of breath, chest pain, nausea, vomiting, abdominal pain.  States that her extremity edema is improving.  Physical Exam:  Vitals:   08/02/24 2320 08/03/24 0322 08/03/24 0333 08/03/24 0735  BP: 135/77  128/66 127/75  Pulse: 63  61 65  Resp: 17  15 20   Temp: 98.3 F (36.8 C)  98.5 F (36.9 C) 98.2 F (36.8 C)  TempSrc: Axillary  Axillary Oral  SpO2: 95% 96% 97% 97%  Weight:   94.5 kg   Height:        GENERAL:  Alert, pleasant, no acute distress  HEENT:  EOMI, nasal cannula CARDIOVASCULAR:  RRR, murmur appreciated RESPIRATORY: Dyspneic, clear to auscultation, no wheezing, rales, or rhonchi GASTROINTESTINAL:  Soft, nontender, nondistended EXTREMITIES: Pression socks with trace LE edema bilaterally NEURO:  No new focal deficits appreciated SKIN:  No rashes noted PSYCH:  Appropriate mood and affect    Data Reviewed:  Imaging Studies: DG Chest Portable 1 View Result Date: 07/30/2024 EXAM: 1 VIEW(S) XRAY OF THE CHEST 07/30/2024 06:58:00 PM COMPARISON: 07/21/2024 CLINICAL HISTORY: SOB. Respiratory Distress FINDINGS: LINES, TUBES AND DEVICES: Right upper extremity PICC in place with tip projecting over the SVC. LUNGS AND PLEURA: Worsening diffuse interstitial opacities. Layering bilateral pleural  effusions. No pulmonary edema. No pneumothorax. HEART AND MEDIASTINUM: Cardiomegaly. Aortic atherosclerosis. CABG noted. BONES AND SOFT TISSUES: Nondisplaced median sternotomy wires. No acute osseous abnormality. IMPRESSION: 1. Worsening edema and  layering bilateral pleural effusions. Electronically signed by: Norman Gatlin MD 07/30/2024 07:27 PM EDT RP Workstation:  [id number]   ECHO TEE Result Date: 07/25/2024    TRANSESOPHOGEAL ECHO REPORT   Patient Name:   Philip [name] Date of Exam: 07/25/2024 Medical Rec #:  [id number]      Height:       69.0 in Accession #:    [id number]     Weight:       215.3 lb Date of Birth:  03-29-1941      BSA:          2.132 m Patient Age:    83 years       BP:           136/89 mmHg Patient Gender: M              HR:           103 bpm. Exam Location:  Inpatient Procedure: Transesophageal Echo, Cardiac Doppler and Color Doppler (Both            Spectral and Color Flow Doppler were utilized during procedure). Indications:     Endocarditis  History:         Patient has prior history of Echocardiogram examinations, most                  recent 07/22/2024. CHF, CAD and Previous Myocardial Infarction;                  Risk Factors:Hypertension and Sleep Apnea.  Sonographer:     Jayson Gaskins Referring Phys:  [phone] MANUELITA B ROBERTS Diagnosing Phys: [name] MD PROCEDURE: After discussion of the risks and benefits of a TEE, an informed consent was obtained from the patient. The transesophogeal probe was passed without difficulty through the esophogus of the patient. Sedation performed by different physician. The patient was monitored while under deep sedation. Anesthestetic sedation was provided intravenously by Anesthesiology: 200mg  of Propofol . Image quality was excellent. The patient developed no complications during the procedure.  IMPRESSIONS  1. Large vegetations noted on MV with probable leaflet perforation; severe eccentric MR.  2. Left ventricular ejection fraction, by estimation, is 55 to 60%. The left ventricle has normal function. The left ventricle has no regional wall motion abnormalities.  3. Right ventricular systolic function is normal. The right ventricular size is normal.  4. Left atrial size was severely dilated. No left atrial/left atrial appendage thrombus was detected.  5. The mitral valve is abnormal. Severe mitral valve  regurgitation.  6. The aortic valve is tricuspid. Aortic valve regurgitation is not visualized. Aortic valve sclerosis/calcification is present, without any evidence of aortic stenosis.  7. There is Moderate (Grade III) plaque involving the descending aorta.  8. 3D performed of the mitral valve. Conclusion(s)/Recommendation(s): Findings are concerning for vegetation/infective endocarditis as detailed above. Findings concerning for mitral valve vegetation. FINDINGS  Left Ventricle: Left ventricular ejection fraction, by estimation, is 55 to 60%. The left ventricle has normal function. The left ventricle has no regional wall motion abnormalities. The left ventricular internal cavity size was normal in size. There is  no left ventricular hypertrophy. Right Ventricle: The right ventricular size is normal. Right ventricular systolic function is normal. Left Atrium: Left atrial size was severely dilated. No left atrial/left atrial appendage  thrombus was detected. Right Atrium: Right atrial size was normal in size. Pericardium: There is no evidence of pericardial effusion. Mitral Valve: The mitral valve is abnormal. Severe mitral valve regurgitation. Tricuspid Valve: The tricuspid valve is normal in structure. Tricuspid valve regurgitation is mild. Aortic Valve: The aortic valve is tricuspid. Aortic valve regurgitation is not visualized. Aortic valve sclerosis/calcification is present, without any evidence of aortic stenosis. Pulmonic Valve: The pulmonic valve was normal in structure. Pulmonic valve regurgitation is trivial. Aorta: The aortic root is normal in size and structure. There is moderate (Grade III) plaque involving the descending aorta. IAS/Shunts: No atrial level shunt detected by color flow Doppler. Additional Comments: Large vegetations noted on MV with probable leaflet perforation; severe eccentric MR. 3D was performed not requiring image post processing on an independent workstation and was abnormal. [name] MD Electronically signed by [name] MD Signature Date/Time: 07/25/2024/10:20:46 AM    Final    EP STUDY Result Date: 07/25/2024 See surgical note for result.  ECHOCARDIOGRAM LIMITED Result Date: 07/22/2024    ECHOCARDIOGRAM LIMITED REPORT   Patient Name:   Philip [name] Date of Exam: 07/22/2024 Medical Rec #:  [id number]      Height:       69.0 in Accession #:    [id number]     Weight:       224.9 lb Date of Birth:  [birth year]      BSA:          2.172 m Patient Age:    83 years       BP:           139/93 mmHg Patient Gender: M              HR:           144 bpm. Exam Location:  Inpatient Procedure: Color Doppler and Limited Echo (Both Spectral and Color Flow Doppler            were utilized during procedure). Indications:    Mitral Valve Disorder  History:        Patient has prior history of Echocardiogram examinations, most                 recent 06/24/2024.  Sonographer:    Tinnie Gosling RDCS Referring Phys: [id number] MAURICIO DANIEL ARRIEN IMPRESSIONS  1. Left ventricular ejection fraction, by estimation, is 60 to 65%. The left ventricle has normal function. Left ventricular endocardial border not optimally defined to evaluate regional wall motion.  2. Left atrial size was mildly dilated.  3. 2.5 x 1.5 cm mobile vegetation attached to the atrial surface of the posterior leaflet of the mitral valve near the base of the leaflet. It prolapses across the leaflet. Mitral regurgitation is present but is not fully interrogated. Moderate mitral annular calcification.  4. There is mild calcification of the aortic valve.  5. The inferior vena cava is normal in size with greater than 50% respiratory variability, suggesting right atrial pressure of 3 mmHg.  6. Very limited echo. There is still a large mitral valve vegetation. The mitral regurgitation was not fully interrogated. The RV was not visualized. The aortic valve was not visualized. Would consider full echo or repeat TEE. FINDINGS  Left Ventricle:  Left ventricular ejection fraction, by estimation, is 60 to 65%. The left ventricle has normal function. Left ventricular endocardial border not optimally defined to evaluate regional wall motion. The left ventricular internal cavity size was normal in size. There is  no left ventricular hypertrophy. Left Atrium: Left atrial size was mildly dilated. Mitral Valve: 2.5 x 1.5 cm mobile vegetation attached to the atrial surface of the posterior leaflet of the mitral valve near the base of the leaflet. It prolapses across the leaflet. Mitral regurgitation is present but is not fully interrogated. Moderate mitral annular calcification. Aortic Valve: There is mild calcification of the aortic valve. Venous: The inferior vena cava is normal in size with greater than 50% respiratory variability, suggesting right atrial pressure of 3 mmHg. IVC IVC diam: 1.40 cm Dalton McleanMD Electronically signed by Ezra Kanner Signature Date/Time: 07/22/2024/9:28:59 PM    Final    DG Chest Portable 1 View Result Date: 07/21/2024 CLINICAL DATA:  Shortness of breath. EXAM: PORTABLE CHEST 1 VIEW COMPARISON:  06/19/2024 FINDINGS: Cardiopericardial silhouette is at upper limits of normal for size. Diffuse interstitial and basilar airspace disease suggests edema. No substantial pleural effusion. Right PICC line tip overlies the right innominate vein. Telemetry leads overlie the chest. IMPRESSION: Diffuse interstitial and basilar airspace disease suggests edema. Electronically Signed   By: Camellia Candle M.D.   On: 07/21/2024 11:49   US  EKG SITE RITE Result Date: 07/14/2024 If Site Rite image not attached, placement could not be confirmed due to current cardiac rhythm.   There are no new results to review at this time.  Previous records (including but not limited to H&P, progress notes, nursing notes, TOC management) were reviewed in assessment of this patient.  Labs: CBC: Recent Labs  Lab 07/30/24 1842 07/30/24 1900  07/31/24 0512 08/02/24 0556 08/03/24 0244  WBC 10.3  --  6.4 4.8 5.0  NEUTROABS 3.8  --   --   --   --   HGB 13.5 13.3  13.6 11.3* 11.4* 11.8*  HCT 40.7 39.0  40.0 33.3* 33.9* 34.4*  MCV 97.1  --  95.4 95.2 94.0  PLT 162  --  110* 117* 119*   Basic Metabolic Panel: Recent Labs  Lab 07/30/24 1842 07/30/24 1900 07/31/24 0512 08/01/24 0400 08/02/24 0556 08/03/24 0244  NA 133* 133*  133* 135 136 138 138  K 3.8 3.7  3.7 4.0 3.4* 3.3* 3.2*  CL 95* 97* 98 98 99 97*  CO2 22  --  25 27 28 27   GLUCOSE 252* 260* 105* 93 92 100*  BUN 44* 43* 44* 42* 37* 35*  CREATININE 2.39* 2.40* 2.24* 2.21* 2.01* 2.09*  CALCIUM  8.9  --  8.4* 8.3* 8.6* 8.4*  MG  --   --  2.3  --  2.3 2.1   Liver Function Tests: Recent Labs  Lab 07/30/24 1842 08/02/24 0556  AST 49* 35  ALT 5 5  ALKPHOS 51 38  BILITOT 0.9 1.2  PROT 6.5 5.8*  ALBUMIN 3.8 3.3*   CBG: No results for input(s): GLUCAP in the last 168 hours.  Scheduled Meds:  amiodarone   400 mg Oral BID   Followed by   NOREEN ON 08/04/2024] amiodarone   200 mg Oral BID   apixaban   2.5 mg Oral BID   atorvastatin   40 mg Oral Daily   Chlorhexidine  Gluconate Cloth  6 each Topical Daily   furosemide   60 mg Intravenous Q12H   metoprolol  tartrate  50 mg Oral BID   potassium chloride   40 mEq Oral BID   sodium chloride  flush  10-40 mL Intracatheter Q12H   sodium chloride  flush  3 mL Intravenous Q12H   Continuous Infusions: PRN Meds:.acetaminophen  **OR** acetaminophen , ALPRAZolam, ondansetron  **OR** ondansetron  (ZOFRAN ) IV, senna-docusate,  sodium chloride  flush  Family Communication: None at bedside  Disposition: Status is: Inpatient Remains inpatient appropriate because: acute hypoxic respiratory failure     Time spent: 39 minutes  Length of inpatient stay: 4 days  Author: Carliss LELON Canales, DO 08/03/2024 10:13 AM  For on call review [email or web address].

## 2024-08-03 NOTE — Progress Notes (Signed)
   08/03/24 1314  Mobility  Activity Ambulated with assistance  Level of Assistance Contact guard assist, steadying assist  Assistive Device None  Distance Ambulated (ft) 200 ft  Activity Response Tolerated fair  Mobility Referral Yes  Mobility visit 1 Mobility  Mobility Specialist Start Time (ACUTE ONLY) 1314  Mobility Specialist Stop Time (ACUTE ONLY) 1327  Mobility Specialist Time Calculation (min) (ACUTE ONLY) 13 min   Mobility Specialist: Progress Note  Pre-Mobility:      HR 56 During Mobility: HR 70, SpO2 96% RA Post-Mobility:    HR 68, SpO2 98% RA  Pt agreeable to mobility session - received in chair. Pt was asymptomatic throughout session with no complaints. Returned to chair with all needs met - call bell within reach.   Additional comments: Pt with unsteady gait, LOB x2 - self corrected.   Virgle Boards, BS Mobility Specialist Please contact via SecureChat or  Rehab office at [phone].

## 2024-08-03 NOTE — Plan of Care (Signed)
   Problem: Health Behavior/Discharge Planning: Goal: Ability to manage health-related needs will improve Outcome: Progressing   Problem: Clinical Measurements: Goal: Ability to maintain clinical measurements within normal limits will improve Outcome: Progressing   Problem: Clinical Measurements: Goal: Will remain free from infection Outcome: Progressing

## 2024-08-04 ENCOUNTER — Other Ambulatory Visit (HOSPITAL_COMMUNITY): Payer: Self-pay

## 2024-08-04 DIAGNOSIS — I5033 Acute on chronic diastolic (congestive) heart failure: Secondary | ICD-10-CM | POA: Diagnosis not present

## 2024-08-04 DIAGNOSIS — J9601 Acute respiratory failure with hypoxia: Secondary | ICD-10-CM | POA: Diagnosis not present

## 2024-08-04 DIAGNOSIS — I480 Paroxysmal atrial fibrillation: Secondary | ICD-10-CM | POA: Diagnosis not present

## 2024-08-04 DIAGNOSIS — I330 Acute and subacute infective endocarditis: Secondary | ICD-10-CM | POA: Diagnosis not present

## 2024-08-04 LAB — CULTURE, BLOOD (ROUTINE X 2): Culture: NO GROWTH

## 2024-08-04 LAB — BASIC METABOLIC PANEL WITH GFR
Anion gap: 11 (ref 5–15)
BUN: 35 mg/dL — ABNORMAL HIGH (ref 8–23)
CO2: 27 mmol/L (ref 22–32)
Calcium: 9 mg/dL (ref 8.9–10.3)
Chloride: 99 mmol/L (ref 98–111)
Creatinine, Ser: 2.07 mg/dL — ABNORMAL HIGH (ref 0.61–1.24)
GFR, Estimated: 31 mL/min — ABNORMAL LOW (ref >60)
Glucose, Bld: 97 mg/dL (ref 70–99)
Potassium: 3.9 mmol/L (ref 3.5–5.1)
Sodium: 137 mmol/L (ref 135–145)

## 2024-08-04 LAB — MAGNESIUM: Magnesium: 2.2 mg/dL (ref 1.7–2.4)

## 2024-08-04 MED ORDER — ONDANSETRON HCL 4 MG PO TABS
4.0000 mg | ORAL_TABLET | Freq: Four times a day (QID) | ORAL | 0 refills | Status: DC | PRN
  Filled 2024-08-04: qty 20, 5d supply, fill #0

## 2024-08-04 MED ORDER — APIXABAN 2.5 MG PO TABS: 2.5000 mg | ORAL_TABLET | Freq: Two times a day (BID) | ORAL | Status: DC

## 2024-08-04 MED ORDER — ALPRAZOLAM 0.25 MG PO TABS
0.2500 mg | ORAL_TABLET | Freq: Two times a day (BID) | ORAL | 0 refills | Status: DC | PRN
  Filled 2024-08-04: qty 15, 8d supply, fill #0

## 2024-08-04 NOTE — Plan of Care (Signed)
  Problem: Health Behavior/Discharge Planning: Goal: Ability to manage health-related needs will improve Outcome: Adequate for Discharge   Problem: Education: Goal: Knowledge of General Education information will improve Description: Including pain rating scale, medication(s)/side effects and non-pharmacologic comfort measures Outcome: Adequate for Discharge   Problem: Clinical Measurements: Goal: Ability to maintain clinical measurements within normal limits will improve Outcome: Adequate for Discharge   Problem: Clinical Measurements: Goal: Will remain free from infection Outcome: Adequate for Discharge

## 2024-08-04 NOTE — Discharge Summary (Signed)
 Physician Discharge Summary   Patient: Philip [name] MRN: [id number] DOB: [birth year]  Admit date:     07/30/2024  Discharge date: 08/04/24  Discharge Physician: Carliss LELON Canales   PCP: [name], MD   Recommendations at discharge:    Pt to be discharged home with hospice.   If you experience worsening fever, chills, chest pain, shortness of breath, or other concerning symptoms, please call your PCP or go to the emergency department immediately.  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  Repeat TEE last week noting larger vegetation, perforation, severe MR.  Felt to be a poor surgical candidate by CT surgery.  Per ID, no reason to extend/continue cefazolin  past 10/1.  O2 optimized while inpatient.  Palliative care on board.  Will DC with hospice.     Acute hypoxic respiratory failure - Currently on 5 L nasal cannula.  Patient feeling improved, eager for  discharge home.  Acute exacerbation of chronic HFpEF/severe MR - Volume overloaded on presentation.  Responded well to IV Lasix , 60 mg twice daily.  Negative nearly 5 L since presentation.  Metoprolol  on board.  Spironolactone /Jardiance  on hold for now.   Paroxysmal atrial fibrillation - Continues on amiodarone , metoprolol .  Rate stable.    CAD/CABG - No chest pain.  Initial troponin elevation likely demand from tachycardia/CHF.  Continue Eliquis , statin.   Goals of care - Severe MR, endocarditis.  Not felt to be a good surgical candidate.  Goal was to optimize patient volume status and O2 prior to DC.  Will be be discharged home with palliative care/hospice care.     Consultants: Cardiology, palliative care Procedures performed: None Disposition: Hospice care Diet recommendation:  Discharge Diet Orders (From admission, onward)     Start     Ordered   08/04/24 0000  Diet - low sodium heart healthy        08/04/24 1022           Cardiac diet  DISCHARGE MEDICATION: Allergies as of 08/04/2024   No Known Allergies      Medication List     STOP taking these medications    ceFAZolin  IVPB Commonly known as: ANCEF    empagliflozin  10 MG Tabs tablet Commonly known as: JARDIANCE    magnesium  oxide 400 MG tablet Commonly known as: MAG-OX   spironolactone  25 MG tablet Commonly known as: ALDACTONE        TAKE these medications    ALPRAZolam 0.25 MG tablet Commonly known  as: XANAX Take 1 tablet (0.25 mg total) by mouth 2 (two) times daily as needed for anxiety or sleep.   amiodarone  200 MG tablet Commonly known as: PACERONE  Take 2 tablets (400 mg total) by mouth 2 (two) times daily for 7 days, THEN 1 tablet (200 mg total) 2 (two) times daily. Start taking on: July 28, 2024   apixaban  2.5 MG Tabs tablet Commonly known as: ELIQUIS  Take 1 tablet (2.5 mg total) by mouth 2 (two) times daily. What changed:  medication strength how much to take   atorvastatin   40 MG tablet Commonly known as: LIPITOR TAKE 1 TABLET EVERY DAY (NEED APPOINTMENT FOR REFILLS)   B-COMPLEX PO Take 1 tablet by mouth daily.   co-enzyme Q-10 30 MG capsule Take 30 mg by mouth daily.   furosemide  40 MG tablet Commonly known as: Lasix  Take 1 tablet (40 mg total) by mouth daily.   metoprolol  tartrate 50 MG tablet Commonly known as: LOPRESSOR  Take 1 tablet (50 mg total) by mouth 2 (two) times daily.   multivitamin with minerals tablet Take 1 tablet by mouth daily.   Omega-3 Fish Oil  1200 MG Caps Take 2,400 mg by mouth daily.   ondansetron  4 MG tablet Commonly known as: ZOFRAN  Take 1 tablet (4 mg total) by mouth every 6 (six) hours as needed for nausea.   OXYGEN Inhale 2.5 L/min into the lungs continuous.   polyethylene glycol 17 g packet Commonly known as: MIRALAX  / GLYCOLAX  Take 17 g by mouth daily.   VITAMIN C ER PO Take 500 mg by mouth daily.        Follow-up Information     [facility] Follow up.   Contact information: [address] [place]  [id number] [phone]                Discharge Exam: Filed Weights   08/02/24 0326 08/03/24 0333 08/04/24 0402  Weight: 93 kg 94.5 kg 93.9 kg    GENERAL:  Alert, pleasant, no acute distress, in good spirits HEENT:  EOMI, nasal cannula CARDIOVASCULAR:  RRR, murmur appreciated RESPIRATORY: Mildly dyspneic, clear to auscultation, no wheezing, rales, or rhonchi GASTROINTESTINAL:  Soft, nontender, nondistended EXTREMITIES: Pression socks with trace LE edema bilaterally NEURO:  No new focal deficits appreciated SKIN:  No rashes noted PSYCH:  Appropriate mood and affect    Condition at discharge: stable  The results of significant diagnostics from this hospitalization (including imaging, microbiology, ancillary and laboratory) are listed below for reference.   Imaging Studies: DG Chest Portable 1 View Result Date: 07/30/2024 EXAM: 1 VIEW(S) XRAY OF THE  CHEST 07/30/2024 06:58:00 PM COMPARISON: 07/21/2024 CLINICAL HISTORY: SOB. Respiratory Distress FINDINGS: LINES, TUBES AND DEVICES: Right upper extremity PICC in place with tip projecting over the SVC. LUNGS AND PLEURA: Worsening diffuse interstitial opacities. Layering bilateral pleural effusions. No pulmonary edema. No pneumothorax. HEART AND MEDIASTINUM: Cardiomegaly. Aortic atherosclerosis. CABG noted. BONES AND SOFT TISSUES: Nondisplaced median sternotomy wires. No acute osseous abnormality. IMPRESSION: 1. Worsening edema and  layering bilateral pleural effusions. Electronically signed by: Norman Gatlin MD 07/30/2024 07:27 PM EDT RP Workstation: [id number]   ECHO TEE Result Date: 07/25/2024    TRANSESOPHOGEAL ECHO REPORT   Patient Name:   NAVID LENZEN Hendel Date of Exam: 07/25/2024 Medical Rec #:  [id number]      Height:       69.0 in Accession #:    [id number]     Weight:       215.3 lb  Date of Birth:  [birth year]      BSA:          2.132 m Patient Age:    83 years       BP:           136/89 mmHg Patient Gender: M              HR:           103 bpm. Exam Location:  Inpatient Procedure: Transesophageal Echo, Cardiac Doppler and Color Doppler (Both            Spectral and Color Flow Doppler were utilized during procedure). Indications:     Endocarditis  History:         Patient has prior history of Echocardiogram examinations, most                  recent 07/22/2024. CHF, CAD and Previous Myocardial Infarction;                  Risk Factors:Hypertension and Sleep Apnea.  Sonographer:     Jayson Gaskins Referring Phys:  [phone] MANUELITA B ROBERTS Diagnosing Phys: [name] MD PROCEDURE: After discussion of the risks and benefits of a TEE, an informed consent was obtained from the patient. The transesophogeal probe was passed without difficulty through the esophogus of the patient. Sedation performed by different physician. The patient was monitored while under deep sedation. Anesthestetic sedation was provided  intravenously by Anesthesiology: 200mg  of Propofol . Image quality was excellent. The patient developed no complications during the procedure.  IMPRESSIONS  1. Large vegetations noted on MV with probable leaflet perforation; severe eccentric MR.  2. Left ventricular ejection fraction, by estimation, is 55 to 60%. The left ventricle has normal function. The left ventricle has no regional wall motion abnormalities.  3. Right ventricular systolic function is normal. The right ventricular size is normal.  4. Left atrial size was severely dilated. No left atrial/left atrial appendage thrombus was detected.  5. The mitral valve is abnormal. Severe mitral valve regurgitation.  6. The aortic valve is tricuspid. Aortic valve regurgitation is not visualized. Aortic valve sclerosis/calcification is present, without any evidence of aortic stenosis.  7. There is Moderate (Grade III) plaque involving the descending aorta.  8. 3D performed of the mitral valve. Conclusion(s)/Recommendation(s): Findings are concerning for vegetation/infective endocarditis as detailed above. Findings concerning for mitral valve vegetation. FINDINGS  Left Ventricle: Left ventricular ejection fraction, by estimation, is 55 to 60%. The left ventricle has normal function. The left ventricle has no regional wall motion abnormalities. The left ventricular internal cavity size was normal in size. There is  no left ventricular hypertrophy. Right Ventricle: The right ventricular size is normal. Right ventricular systolic function is normal. Left Atrium: Left atrial size was severely dilated. No left atrial/left atrial appendage thrombus was detected. Right Atrium: Right atrial size was normal in size. Pericardium: There is no evidence of pericardial effusion. Mitral Valve: The mitral valve is abnormal. Severe mitral valve regurgitation. Tricuspid Valve: The tricuspid valve is normal in structure. Tricuspid valve regurgitation is mild. Aortic Valve: The aortic  valve is tricuspid. Aortic valve regurgitation is not visualized. Aortic valve sclerosis/calcification is present, without any evidence of aortic stenosis. Pulmonic Valve: The pulmonic valve was normal in structure. Pulmonic valve regurgitation is trivial. Aorta: The aortic root is normal in size and structure. There is moderate (Grade III) plaque involving the descending aorta. IAS/Shunts: No atrial level shunt detected by color flow Doppler.  Additional Comments: Large vegetations noted on MV with probable leaflet perforation; severe eccentric MR. 3D was performed not requiring image post processing on an independent workstation and was abnormal. [name] MD Electronically signed by [name] MD Signature Date/Time: 07/25/2024/10:20:46 AM    Final    EP STUDY Result Date: 07/25/2024 See surgical note for result.  ECHOCARDIOGRAM LIMITED Result Date: 07/22/2024    ECHOCARDIOGRAM LIMITED REPORT   Patient Name:   DAYVEON HALLEY Valentino Date of Exam: 07/22/2024 Medical Rec #:  [id number]      Height:       69.0 in Accession #:    [id number]     Weight:       224.9 lb Date of Birth:  1941/02/22      BSA:          2.172 m Patient Age:    83 years       BP:           139/93 mmHg Patient Gender: M              HR:           144 bpm. Exam Location:  Inpatient Procedure: Color Doppler and Limited Echo (Both Spectral and Color Flow Doppler            were utilized during procedure). Indications:    Mitral Valve Disorder  History:        Patient has prior history of Echocardiogram examinations, most                 recent 06/24/2024.  Sonographer:    Tinnie Gosling RDCS Referring Phys: [id number] MAURICIO DANIEL ARRIEN IMPRESSIONS  1. Left ventricular ejection fraction, by estimation, is 60 to 65%. The left ventricle has normal function. Left ventricular endocardial border not optimally defined to evaluate regional wall motion.  2. Left atrial size was mildly dilated.  3. 2.5 x 1.5 cm mobile vegetation attached to the atrial  surface of the posterior leaflet of the mitral valve near the base of the leaflet. It prolapses across the leaflet. Mitral regurgitation is present but is not fully interrogated. Moderate mitral annular calcification.  4. There is mild calcification of the aortic valve.  5. The inferior vena cava is normal in size with greater than 50% respiratory variability, suggesting right atrial pressure of 3 mmHg.  6. Very limited echo. There is still a large mitral valve vegetation. The mitral regurgitation was not fully interrogated. The RV was not visualized. The aortic valve was not visualized. Would consider full echo or repeat TEE. FINDINGS  Left Ventricle: Left ventricular ejection fraction, by estimation, is 60 to 65%. The left ventricle has normal function. Left ventricular endocardial border not optimally defined to evaluate regional wall motion. The left ventricular internal cavity size was normal in size. There is no left ventricular hypertrophy. Left Atrium: Left atrial size was mildly dilated. Mitral Valve: 2.5 x 1.5 cm mobile vegetation attached to the atrial surface of the posterior leaflet of the mitral valve near the base of the leaflet. It prolapses across the leaflet. Mitral regurgitation is present but is not fully interrogated. Moderate mitral annular calcification. Aortic Valve: There is mild calcification of the aortic valve. Venous: The inferior vena cava is normal in size with greater than 50% respiratory variability, suggesting right atrial pressure of 3 mmHg. IVC IVC diam: 1.40 cm Dalton McleanMD Electronically signed by Ezra Kanner Signature Date/Time: 07/22/2024/9:28:59 PM    Final    DG Chest  Portable 1 View Result Date: 07/21/2024 CLINICAL DATA:  Shortness of breath. EXAM: PORTABLE CHEST 1 VIEW COMPARISON:  06/19/2024 FINDINGS: Cardiopericardial silhouette is at upper limits of normal for size. Diffuse interstitial and basilar airspace disease suggests edema. No substantial pleural  effusion. Right PICC line tip overlies the right innominate vein. Telemetry leads overlie the chest. IMPRESSION: Diffuse interstitial and basilar airspace disease suggests edema. Electronically Signed   By: Camellia Candle M.D.   On: 07/21/2024 11:49   US  EKG SITE RITE Result Date: 07/14/2024 If Site Rite image not attached, placement could not be confirmed due to current cardiac rhythm.   Microbiology: Results for orders placed or performed during the hospital encounter of 07/30/24  Blood culture (routine x 2)     Status: None   Collection Time: 07/30/24  8:42 PM   Specimen: BLOOD  Result Value Ref Range Status   Specimen Description BLOOD SITE NOT SPECIFIED  Final   Special Requests   Final    BOTTLES DRAWN AEROBIC ONLY Blood Culture results may not be optimal due to an inadequate volume of blood received in culture bottles   Culture   Final    NO GROWTH 5 DAYS Performed at [facility], [address] [place], [place] [address]    Report Status 08/04/2024 FINAL  Final  Blood culture (routine x 2)     Status: None (Preliminary result)   Collection Time: 07/31/24 12:31 AM   Specimen: BLOOD LEFT ARM  Result Value Ref Range Status   Specimen Description BLOOD LEFT ARM  Final   Special Requests   Final    BOTTLES DRAWN AEROBIC AND ANAEROBIC Blood Culture adequate volume   Culture   Final    NO GROWTH 4 DAYS Performed at [facility], [address], [place], [place] [address]    Report Status PENDING  Incomplete  MRSA Next Gen by PCR, Nasal     Status: None   Collection Time: 08/01/24  3:08 PM   Specimen: Nasal Mucosa; Nasal Swab  Result Value Ref Range Status   MRSA by PCR Next Gen NOT DETECTED NOT DETECTED Final    Comment: (NOTE) The GeneXpert MRSA Assay (FDA approved for NASAL specimens only), is one component of a comprehensive MRSA colonization surveillance program. It is not intended to diagnose MRSA infection nor to guide or monitor treatment for MRSA  infections. Test performance is not FDA approved in patients less than 37 years old. Performed at [facility], [address], [place], [place] [address]     Labs: CBC: Recent Labs  Lab 07/30/24 1842 07/30/24 1900 07/31/24 0512 08/02/24 0556 08/03/24 0244  WBC 10.3  --  6.4 4.8 5.0  NEUTROABS 3.8  --   --   --   --   HGB 13.5 13.3  13.6 11.3* 11.4* 11.8*  HCT 40.7 39.0  40.0 33.3* 33.9* 34.4*  MCV 97.1  --  95.4 95.2 94.0  PLT 162  --  110* 117* 119*   Basic Metabolic Panel: Recent Labs  Lab 07/31/24 0512 08/01/24 0400 08/02/24 0556 08/03/24 0244 08/04/24 0430  NA 135 136 138 138 137  K 4.0 3.4* 3.3* 3.2* 3.9  CL 98 98 99 97* 99  CO2 25 27 28 27 27   GLUCOSE 105* 93 92 100* 97  BUN 44* 42* 37* 35* 35*  CREATININE 2.24* 2.21* 2.01* 2.09* 2.07*  CALCIUM  8.4* 8.3* 8.6* 8.4* 9.0  MG 2.3  --  2.3  2.1 2.2   Liver Function Tests: Recent Labs  Lab 07/30/24 1842 08/02/24 0556  AST 49* 35  ALT 5 5  ALKPHOS 51 38  BILITOT 0.9 1.2  PROT 6.5 5.8*  ALBUMIN 3.8 3.3*   CBG: No results for input(s): GLUCAP in the last 168 hours.  Discharge time spent: 35 minutes.  Length of inpatient stay: 5 days  Signed: Carliss LELON Canales, DO [facility] 08/04/2024

## 2024-08-04 NOTE — Care Management (Deleted)
  Transition of Care [facility]) Screening Note   Patient Details  Name: Philip [name] Date of Birth: [birth year]   Transition of Care [facility]) CM/SW Contact:    Corean JAYSON Canary, RN Phone Number: 08/04/2024, 9:57 AM    Transition of Care Department [facility]) has reviewed patient and no TOC needs have been identified at this time. We will continue to monitor patient advancement through interdisciplinary progression rounds. If new patient transition needs arise, please place a TOC consult.

## 2024-08-04 NOTE — TOC Transition Note (Signed)
 Transition of Care [facility]) - Discharge Note   Patient Details  Name: Philip [name] MRN: [id number] Date of Birth: 1941/03/06  Transition of Care [facility]) CM/SW Contact:  Corean JAYSON Canary, RN Phone Number: 08/04/2024, 10:01 AM   Clinical Narrative:     [facility], Magdalena Berber made aware Dc home  Final next level of care: Home w Hospice Care Barriers to Discharge: Continued Medical Work up   Patient Goals and CMS Choice Patient states their goals for this hospitalization and ongoing recovery are:: plan home with hospice CMS Medicare.gov Compare Post Acute Care list provided to:: Patient Represenative (must comment) Choice offered to / list presented to : Spouse      Discharge Placement                       Discharge Plan and Services Additional resources added to the After Visit Summary for     Discharge Planning Services: CM Consult Post Acute Care Choice: Hospice                               Social Drivers of Health (SDOH) Interventions SDOH Screenings   Food Insecurity: No Food Insecurity (07/31/2024)  Housing: Low Risk  (07/31/2024)  Transportation Needs: No Transportation Needs (07/31/2024)  Utilities: Not At Risk (07/31/2024)  Alcohol Screen: Low Risk  (05/15/2024)  Depression (PHQ2-9): Low Risk  (07/12/2024)  Recent Concern: Depression (PHQ2-9) - Medium Risk (07/05/2024)  Financial Resource Strain: Low Risk  (05/15/2024)  Physical Activity: Sufficiently Active (05/15/2024)  Social Connections: Moderately Integrated (07/31/2024)  Stress: No Stress Concern Present (05/15/2024)  Tobacco Use: Medium Risk (07/31/2024)     Readmission Risk Interventions    07/23/2024   11:00 AM 06/18/2024    3:45 PM  Readmission Risk Prevention Plan  Transportation Screening Complete Complete  PCP or Specialist Appt within 5-7 Days Complete   Home Care Screening Complete Complete  Medication Review (RN CM) Complete Complete

## 2024-08-05 LAB — CULTURE, BLOOD (ROUTINE X 2)
Culture: NO GROWTH
Special Requests: ADEQUATE

## 2024-08-06 ENCOUNTER — Ambulatory Visit: Admitting: Internal Medicine

## 2024-08-12 ENCOUNTER — Ambulatory Visit: Admitting: Surgery

## 2024-08-16 ENCOUNTER — Other Ambulatory Visit (HOSPITAL_BASED_OUTPATIENT_CLINIC_OR_DEPARTMENT_OTHER)

## 2024-08-19 ENCOUNTER — Ambulatory Visit: Admitting: Surgery

## 2024-08-20 ENCOUNTER — Other Ambulatory Visit: Payer: Self-pay | Admitting: Medical Genetics

## 2024-08-20 DIAGNOSIS — Z006 Encounter for examination for normal comparison and control in clinical research program: Secondary | ICD-10-CM

## 2024-09-10 ENCOUNTER — Telehealth: Payer: Self-pay

## 2024-09-10 NOTE — Telephone Encounter (Signed)
 Pt passed away on 09/17/24. Will make PCP aware when he returns to office in December.

## 2024-09-23 ENCOUNTER — Ambulatory Visit: Admitting: Internal Medicine

## 2024-09-30 DEATH — deceased

## 2025-04-02 ENCOUNTER — Ambulatory Visit
